# Patient Record
Sex: Female | Born: 1940 | Race: White | Hispanic: No | Smoking: Former smoker
Health system: Southern US, Community
[De-identification: ages and names within clinical notes are randomized; demographics above are authoritative.]

## PROBLEM LIST (undated history)

## (undated) DIAGNOSIS — Z923 Personal history of irradiation: Secondary | ICD-10-CM

## (undated) DIAGNOSIS — Z8679 Personal history of other diseases of the circulatory system: Secondary | ICD-10-CM

## (undated) DIAGNOSIS — I4819 Other persistent atrial fibrillation: Secondary | ICD-10-CM

## (undated) DIAGNOSIS — Z9889 Other specified postprocedural states: Secondary | ICD-10-CM

## (undated) DIAGNOSIS — Z973 Presence of spectacles and contact lenses: Secondary | ICD-10-CM

## (undated) DIAGNOSIS — IMO0001 Reserved for inherently not codable concepts without codable children: Secondary | ICD-10-CM

## (undated) DIAGNOSIS — M720 Palmar fascial fibromatosis [Dupuytren]: Secondary | ICD-10-CM

## (undated) DIAGNOSIS — I499 Cardiac arrhythmia, unspecified: Secondary | ICD-10-CM

## (undated) DIAGNOSIS — K5190 Ulcerative colitis, unspecified, without complications: Secondary | ICD-10-CM

## (undated) DIAGNOSIS — C50412 Malignant neoplasm of upper-outer quadrant of left female breast: Secondary | ICD-10-CM

## (undated) DIAGNOSIS — I10 Essential (primary) hypertension: Secondary | ICD-10-CM

## (undated) DIAGNOSIS — M8580 Other specified disorders of bone density and structure, unspecified site: Secondary | ICD-10-CM

## (undated) DIAGNOSIS — K635 Polyp of colon: Secondary | ICD-10-CM

## (undated) DIAGNOSIS — Z803 Family history of malignant neoplasm of breast: Secondary | ICD-10-CM

## (undated) DIAGNOSIS — Z7901 Long term (current) use of anticoagulants: Secondary | ICD-10-CM

## (undated) DIAGNOSIS — I2510 Atherosclerotic heart disease of native coronary artery without angina pectoris: Secondary | ICD-10-CM

## (undated) DIAGNOSIS — I484 Atypical atrial flutter: Secondary | ICD-10-CM

## (undated) DIAGNOSIS — I340 Nonrheumatic mitral (valve) insufficiency: Secondary | ICD-10-CM

## (undated) DIAGNOSIS — E785 Hyperlipidemia, unspecified: Secondary | ICD-10-CM

## (undated) DIAGNOSIS — Z951 Presence of aortocoronary bypass graft: Secondary | ICD-10-CM

## (undated) DIAGNOSIS — IMO0002 Reserved for concepts with insufficient information to code with codable children: Secondary | ICD-10-CM

## (undated) HISTORY — DX: Reserved for concepts with insufficient information to code with codable children: IMO0002

## (undated) HISTORY — DX: Essential (primary) hypertension: I10

## (undated) HISTORY — DX: Other specified disorders of bone density and structure, unspecified site: M85.80

## (undated) HISTORY — DX: Nonrheumatic mitral (valve) insufficiency: I34.0

## (undated) HISTORY — DX: Other persistent atrial fibrillation: I48.19

## (undated) HISTORY — PX: TONSILLECTOMY: SUR1361

## (undated) HISTORY — DX: Family history of malignant neoplasm of breast: Z80.3

## (undated) HISTORY — DX: Polyp of colon: K63.5

## (undated) HISTORY — DX: Palmar fascial fibromatosis (dupuytren): M72.0

## (undated) HISTORY — DX: Long term (current) use of anticoagulants: Z79.01

## (undated) HISTORY — DX: Atherosclerotic heart disease of native coronary artery without angina pectoris: I25.10

## (undated) HISTORY — PX: CARDIAC CATHETERIZATION: SHX172

## (undated) HISTORY — DX: Malignant neoplasm of upper-outer quadrant of left female breast: C50.412

## (undated) HISTORY — DX: Ulcerative colitis, unspecified, without complications: K51.90

## (undated) HISTORY — DX: Reserved for inherently not codable concepts without codable children: IMO0001

## (undated) HISTORY — PX: COLONOSCOPY: SHX174

---

## 1988-11-22 HISTORY — PX: DUPUYTREN CONTRACTURE RELEASE: SHX1478

## 1998-05-27 ENCOUNTER — Other Ambulatory Visit: Admission: RE | Admit: 1998-05-27 | Discharge: 1998-05-27 | Payer: Self-pay | Admitting: Obstetrics and Gynecology

## 1998-09-11 ENCOUNTER — Other Ambulatory Visit: Admission: RE | Admit: 1998-09-11 | Discharge: 1998-09-11 | Payer: Self-pay | Admitting: Obstetrics and Gynecology

## 1999-03-02 ENCOUNTER — Other Ambulatory Visit: Admission: RE | Admit: 1999-03-02 | Discharge: 1999-03-02 | Payer: Self-pay | Admitting: Obstetrics and Gynecology

## 1999-06-09 ENCOUNTER — Other Ambulatory Visit: Admission: RE | Admit: 1999-06-09 | Discharge: 1999-06-09 | Payer: Self-pay | Admitting: Obstetrics and Gynecology

## 1999-11-23 HISTORY — PX: DUPUYTREN CONTRACTURE RELEASE: SHX1478

## 2000-02-22 ENCOUNTER — Encounter: Payer: Self-pay | Admitting: Obstetrics and Gynecology

## 2000-02-22 ENCOUNTER — Encounter: Admission: RE | Admit: 2000-02-22 | Discharge: 2000-02-22 | Payer: Self-pay | Admitting: Obstetrics and Gynecology

## 2000-06-06 ENCOUNTER — Other Ambulatory Visit: Admission: RE | Admit: 2000-06-06 | Discharge: 2000-06-06 | Payer: Self-pay | Admitting: Obstetrics and Gynecology

## 2000-11-01 ENCOUNTER — Ambulatory Visit (HOSPITAL_BASED_OUTPATIENT_CLINIC_OR_DEPARTMENT_OTHER): Admission: RE | Admit: 2000-11-01 | Discharge: 2000-11-01 | Payer: Self-pay | Admitting: Orthopedic Surgery

## 2000-11-01 ENCOUNTER — Encounter (INDEPENDENT_AMBULATORY_CARE_PROVIDER_SITE_OTHER): Payer: Self-pay | Admitting: *Deleted

## 2001-05-08 ENCOUNTER — Encounter: Payer: Self-pay | Admitting: Obstetrics and Gynecology

## 2001-05-08 ENCOUNTER — Encounter: Admission: RE | Admit: 2001-05-08 | Discharge: 2001-05-08 | Payer: Self-pay | Admitting: Obstetrics and Gynecology

## 2001-06-06 ENCOUNTER — Other Ambulatory Visit: Admission: RE | Admit: 2001-06-06 | Discharge: 2001-06-06 | Payer: Self-pay | Admitting: Obstetrics and Gynecology

## 2001-06-21 ENCOUNTER — Encounter: Admission: RE | Admit: 2001-06-21 | Discharge: 2001-06-21 | Payer: Self-pay | Admitting: Obstetrics and Gynecology

## 2001-06-21 ENCOUNTER — Encounter: Payer: Self-pay | Admitting: Obstetrics and Gynecology

## 2002-06-28 ENCOUNTER — Other Ambulatory Visit: Admission: RE | Admit: 2002-06-28 | Discharge: 2002-06-28 | Payer: Self-pay | Admitting: Obstetrics and Gynecology

## 2002-11-08 ENCOUNTER — Encounter: Admission: RE | Admit: 2002-11-08 | Discharge: 2002-11-08 | Payer: Self-pay | Admitting: Obstetrics and Gynecology

## 2002-11-08 ENCOUNTER — Encounter: Payer: Self-pay | Admitting: Obstetrics and Gynecology

## 2003-09-10 ENCOUNTER — Encounter: Payer: Self-pay | Admitting: Obstetrics and Gynecology

## 2003-09-10 ENCOUNTER — Encounter: Admission: RE | Admit: 2003-09-10 | Discharge: 2003-09-10 | Payer: Self-pay | Admitting: Obstetrics and Gynecology

## 2003-11-11 ENCOUNTER — Encounter: Admission: RE | Admit: 2003-11-11 | Discharge: 2003-11-11 | Payer: Self-pay | Admitting: Obstetrics and Gynecology

## 2004-01-30 ENCOUNTER — Encounter (INDEPENDENT_AMBULATORY_CARE_PROVIDER_SITE_OTHER): Payer: Self-pay | Admitting: *Deleted

## 2004-01-30 ENCOUNTER — Ambulatory Visit (HOSPITAL_COMMUNITY): Admission: RE | Admit: 2004-01-30 | Discharge: 2004-01-30 | Payer: Self-pay | Admitting: Gastroenterology

## 2004-07-21 ENCOUNTER — Other Ambulatory Visit: Admission: RE | Admit: 2004-07-21 | Discharge: 2004-07-21 | Payer: Self-pay | Admitting: Obstetrics and Gynecology

## 2004-08-19 ENCOUNTER — Encounter: Admission: RE | Admit: 2004-08-19 | Discharge: 2004-08-19 | Payer: Self-pay | Admitting: Family Medicine

## 2005-02-11 ENCOUNTER — Encounter: Admission: RE | Admit: 2005-02-11 | Discharge: 2005-02-11 | Payer: Self-pay | Admitting: Obstetrics and Gynecology

## 2005-07-23 ENCOUNTER — Other Ambulatory Visit: Admission: RE | Admit: 2005-07-23 | Discharge: 2005-07-23 | Payer: Self-pay | Admitting: Obstetrics and Gynecology

## 2006-03-23 ENCOUNTER — Encounter: Payer: Self-pay | Admitting: Obstetrics and Gynecology

## 2006-04-12 ENCOUNTER — Encounter: Admission: RE | Admit: 2006-04-12 | Discharge: 2006-04-12 | Payer: Self-pay | Admitting: Obstetrics and Gynecology

## 2006-05-03 ENCOUNTER — Encounter: Admission: RE | Admit: 2006-05-03 | Discharge: 2006-05-03 | Payer: Self-pay | Admitting: Obstetrics and Gynecology

## 2006-07-26 ENCOUNTER — Other Ambulatory Visit: Admission: RE | Admit: 2006-07-26 | Discharge: 2006-07-26 | Payer: Self-pay | Admitting: Obstetrics & Gynecology

## 2007-03-17 ENCOUNTER — Encounter: Admission: RE | Admit: 2007-03-17 | Discharge: 2007-03-17 | Payer: Self-pay | Admitting: Family Medicine

## 2007-06-21 ENCOUNTER — Encounter: Admission: RE | Admit: 2007-06-21 | Discharge: 2007-06-21 | Payer: Self-pay | Admitting: Family Medicine

## 2007-08-04 ENCOUNTER — Other Ambulatory Visit: Admission: RE | Admit: 2007-08-04 | Discharge: 2007-08-04 | Payer: Self-pay | Admitting: Obstetrics and Gynecology

## 2008-07-01 ENCOUNTER — Encounter: Admission: RE | Admit: 2008-07-01 | Discharge: 2008-07-01 | Payer: Self-pay | Admitting: Family Medicine

## 2008-07-09 ENCOUNTER — Encounter: Admission: RE | Admit: 2008-07-09 | Discharge: 2008-07-09 | Payer: Self-pay | Admitting: Family Medicine

## 2008-08-05 ENCOUNTER — Other Ambulatory Visit: Admission: RE | Admit: 2008-08-05 | Discharge: 2008-08-05 | Payer: Self-pay | Admitting: Obstetrics and Gynecology

## 2008-11-11 ENCOUNTER — Encounter: Admission: RE | Admit: 2008-11-11 | Discharge: 2008-11-11 | Payer: Self-pay | Admitting: Obstetrics and Gynecology

## 2009-01-08 ENCOUNTER — Encounter: Admission: RE | Admit: 2009-01-08 | Discharge: 2009-01-08 | Payer: Self-pay | Admitting: Family Medicine

## 2009-07-03 ENCOUNTER — Encounter: Admission: RE | Admit: 2009-07-03 | Discharge: 2009-07-03 | Payer: Self-pay | Admitting: Obstetrics and Gynecology

## 2010-08-11 ENCOUNTER — Encounter: Admission: RE | Admit: 2010-08-11 | Discharge: 2010-08-11 | Payer: Self-pay | Admitting: Obstetrics & Gynecology

## 2010-08-17 ENCOUNTER — Encounter: Admission: RE | Admit: 2010-08-17 | Discharge: 2010-08-17 | Payer: Self-pay | Admitting: Obstetrics & Gynecology

## 2010-12-13 ENCOUNTER — Encounter: Payer: Self-pay | Admitting: Obstetrics & Gynecology

## 2010-12-13 ENCOUNTER — Encounter: Payer: Self-pay | Admitting: Obstetrics and Gynecology

## 2011-04-09 NOTE — Op Note (Signed)
NAME:  Michele Green, Michele Green                          ACCOUNT NO.:  [id number]   MEDICAL RECORD NO.:  [id number]                   PATIENT TYPE:  AMB   LOCATION:  ENDO                                 FACILITY:  [facility]   PHYSICIAN:  John C. Amedeo Plenty, M.D.                 DATE OF BIRTH:  Dec 24, 1940   DATE OF PROCEDURE:  01/30/2004  DATE OF DISCHARGE:                                 OPERATIVE REPORT   PROCEDURE PERFORMED:  Colonoscopy with polypectomy.   ENDOSCOPIST:  Missy Sabins, M.D.   INDICATIONS FOR PROCEDURE:  Colon cancer screening in a 70 year old patient  with no prior screening.   DESCRIPTION OF PROCEDURE:  The patient was placed in the left lateral  decubitus position and placed on the pulse monitor with continuous low-flow  oxygen delivered by nasal cannula.  The patient was sedated with 100 mcg of  IV fentanyl and 10 mg of IV Versed.  The Olympus video colonoscope was  inserted into the rectum and advanced to the cecum, confirmed by  transillumination of McBurney's point and visualization of the ileocecal  valve and appendiceal orifice.  The prep was excellent.  The cecum,  ascending, transverse, descending and sigmoid colon all appeared normal with  no masses, polyps, diverticula or other mucosal abnormalities.  There were  two or three nearly flat translucent polyps in the rectum no more than 6 mm  in the diameter and the largest of these was hot biopsied.  The remainder of  the rectum appeared normal.  The scope was then withdrawn and the patient  returned to the recovery room in stable condition.  She tolerated the  procedure well.  There were no immediate complications.   IMPRESSION:  Two or three small distal rectal polyps probably hyperplastic.   PLAN:  Await histology to determine method and interval for future colon  screening.                                               John C. Amedeo Plenty, M.D.    J[facility]/MEDQ  D:  01/30/2004  T:  01/31/2004  Job:  [id number]   cc:   Osvaldo Human, M.D.  [address] [place]  [place] [address]  Fax: [phone]

## 2011-04-09 NOTE — Op Note (Signed)
[place]  Patient:    Michele Green, Michele Green                       MRN: [id number] Proc. Date: 11/01/00 Adm. Date:  [id number] Attending:  Isaac Laud CC:         Two copies to Dr. Daylene Katayama   Operative Report  PREOPERATIVE DIAGNOSES: 1. Severe Dupuytrens contracture affecting left small finger involving the    palm and finger with pretendinous cords, spiral bands and extensive nodular    disease overlying the proximal and middle phalangeal segments of the    finger. 2. Palmar pretendinous fibromatosis ring finger, left hand.  POSTOPERATIVE DIAGNOSES: 1. Severe Dupuytrens contracture affecting left small finger involving the    palm and finger with pretendinous cords, spiral bands and extensive nodular    disease overlying the proximal and middle phalangeal segments of the    finger. 2. Palmar pretendinous fibromatosis ring finger, left hand.  OPERATION: 1. Excision of Dupuytrens contracture from left small finger involving the    palm and finger with VY advancement flap closure. 2. Resection of Dupuytrens contracture pretendinous fibers in the palm, left    ring finger.  SURGEON:  Youlanda Mighty. Sypher, Brooke Bonito., M.D.  ASSISTANT:  Julian Reil, P.A.  ANESTHESIA:  Axillary block.  SUPERVISING ANESTHESIOLOGIST:  Crissie Sickles. Conrad Shelley, M.D.  INDICATIONS:  Michele Green is a 70 year old woman who has had previous Dupuytrens contracture affecting the right hand.  She is status post surgical removal in 1996.  She has had no recurrence in her palm but has had a minor recurrence in her small finger distal to the site of previous surgery. Recently she developed a profound Dupuytrens contracture affecting her left hand causing a significant PIP and MP flexion contracture with hyperextension of her DIP joint.  She requested surgical correction of this at this time.  After informed consent, in which she was advised of the potential complications  including neurovascular injury and/or development of reflex sympathetic dystropy she agrees to proceed with surgery at this time.  She understands that it is likely that her Dupuytrens diathesis will progress over time causing further fibromatosis elsewhere in her hand and can even recur in the operated fingers.  DESCRIPTION OF PROCEDURE:  Following placement of an axillary block in the holding area, anesthesia was satisfactory in the right arm.  The arm was then prepped with Betadine soap and solution and sterilely draped.  Following exsanguination of the limb with an Esmarch bandage, tourniquet was inflated to 240 mmHg.  Procedure commenced with planning of a Brunners zigzag incision in the palm and extending distally to the DIP flexion crease of the small finger.  Skin incisions were taken sharply and skin undermined at the level of the dermis, carefully separating the palmar fibromatosis from the dermis.  Care was taken to identify the superficial palmar arch and common digital vessels to the ring and small fingers.  The dissection was carried distally excising the pretendinous fibers to the ring and small fingers followed by removal of a spiral band and an extension from the abductor digiti minimi extending to the middle phalangeal segment of the small finger.  A very large nodule was excised from the proximal phalangeal segment of the small finger that was ulnar to the neurovascular bundle and probably represented the lateral fascial sheath of the finger.  A second large nodule was removed from the middle phalangeal segment that was more  midline.  The neurovascular bundles were carefully detected throughout. The natatory ligaments were resected between the ring and small fingers followed by removal of the pretendinous fibers to the proximal phalangeal segment of the ring finger.  Great care was taken to dissect the C1 pulley and the spiral oblique retinacular ligament that was  causing the hyperextension posture of the distal interphalangeal joint.  The contracture was relieved to 20 degrees at the PIP joint.  The PIP joint was then gently manipulated with release of the volar plate to allow full extension of the PIP joint.  With the PIP in 0 degrees I could flex the DIP joint 70 degrees with minimal tension.  There was a slight contracture of the extensor mechanism which will require therapy to resolve.  Bleeding points were electrocauterized with bipolar current followed by careful inspection for residual subdermal disease.  This was removed from the deep side of the dermis with a rongeur.  The skin flaps were then tailored to allow closure of the small finger skin without excessive space deep to the dermis.  VY advancement flaps were performed to lengthen the palmar skin.  The flaps were inset with coroner sutures of 5-0 nylon and simple interrupted sutures of 5-0 nylon.  The tourniquet was released with immediate capillary refill in the finger tip of the small and ring fingers.  The wound was then dressed with Adaptic, Silvadene, a voluminous gauze dressing, a Curlex fluff and a volar plaster splint maintaining the wrist in a neutral position and the MP and PIP joints of the small finger in full extension.  Michele Green tolerated surgery and anesthesia well.  She was transferred to the recovery room with stable vital signs.  She will be discharged with prescriptions for Percocet 5 mg one or two tablets p.o. q.4-6h. p.r.n. pain, also Keflex 500 mg one p.o. q.8h. x4 days as a prophylactic antibiotic and Motrin 600 mg one p.o. q.6h. p.r.n. pain.DD: 11/01/00 TD:  11/01/00 Job: [id number]

## 2011-09-01 ENCOUNTER — Other Ambulatory Visit: Payer: Self-pay | Admitting: Obstetrics & Gynecology

## 2011-09-01 DIAGNOSIS — Z1231 Encounter for screening mammogram for malignant neoplasm of breast: Secondary | ICD-10-CM

## 2011-09-21 ENCOUNTER — Ambulatory Visit: Payer: Self-pay

## 2011-10-15 ENCOUNTER — Ambulatory Visit
Admission: RE | Admit: 2011-10-15 | Discharge: 2011-10-15 | Disposition: A | Discharge status: Home / Self Care | Payer: Medicare Other | Source: Ambulatory Visit | Attending: Obstetrics & Gynecology | Admitting: Obstetrics & Gynecology

## 2011-10-15 DIAGNOSIS — Z1231 Encounter for screening mammogram for malignant neoplasm of breast: Secondary | ICD-10-CM

## 2012-02-21 DIAGNOSIS — K635 Polyp of colon: Secondary | ICD-10-CM

## 2012-02-21 HISTORY — DX: Polyp of colon: K63.5

## 2012-03-21 ENCOUNTER — Other Ambulatory Visit: Payer: Self-pay | Admitting: Gastroenterology

## 2012-10-25 ENCOUNTER — Other Ambulatory Visit: Payer: Self-pay | Admitting: Obstetrics & Gynecology

## 2012-10-25 DIAGNOSIS — Z1231 Encounter for screening mammogram for malignant neoplasm of breast: Secondary | ICD-10-CM

## 2012-12-04 ENCOUNTER — Ambulatory Visit
Admission: RE | Admit: 2012-12-04 | Discharge: 2012-12-04 | Disposition: A | Discharge status: Home / Self Care | Payer: Medicare Other | Source: Ambulatory Visit | Attending: Obstetrics & Gynecology | Admitting: Obstetrics & Gynecology

## 2012-12-04 DIAGNOSIS — Z1231 Encounter for screening mammogram for malignant neoplasm of breast: Secondary | ICD-10-CM

## 2013-09-03 ENCOUNTER — Ambulatory Visit (INDEPENDENT_AMBULATORY_CARE_PROVIDER_SITE_OTHER): Payer: Medicare Other | Admitting: Nurse Practitioner

## 2013-09-03 ENCOUNTER — Encounter: Payer: Self-pay | Admitting: Nurse Practitioner

## 2013-09-03 VITALS — BP 110/60 | HR 64 | Resp 16 | Ht 63.75 in | Wt 145.0 lb

## 2013-09-03 DIAGNOSIS — M8580 Other specified disorders of bone density and structure, unspecified site: Secondary | ICD-10-CM

## 2013-09-03 DIAGNOSIS — F4323 Adjustment disorder with mixed anxiety and depressed mood: Secondary | ICD-10-CM

## 2013-09-03 DIAGNOSIS — Z01419 Encounter for gynecological examination (general) (routine) without abnormal findings: Secondary | ICD-10-CM

## 2013-09-03 DIAGNOSIS — E559 Vitamin D deficiency, unspecified: Secondary | ICD-10-CM

## 2013-09-03 DIAGNOSIS — M899 Disorder of bone, unspecified: Secondary | ICD-10-CM

## 2013-09-03 MED ORDER — ERGOCALCIFEROL 1.25 MG (50000 UT) PO CAPS: 50000.0000 [IU] | ORAL_CAPSULE | ORAL | Status: DC

## 2013-09-03 MED ORDER — PAROXETINE HCL 20 MG PO TABS: 20.0000 mg | ORAL_TABLET | Freq: Every day | ORAL | Status: DC

## 2013-09-03 NOTE — Patient Instructions (Signed)

## 2013-09-03 NOTE — Progress Notes (Signed)
Patient ID: Michele Green, female   DOB: 30-Aug-1941, 72 y.o.   MRN: [id number] 72 y.o. [id number] Married Caucasian Fe here for annual exam.  No new health problems.  Doing well on Paxil and wants to continue.  They took the 103 yo granddaughter to [place] this year and spent an enjoyable time with her.  No LMP recorded. Patient is postmenopausal.          Sexually active: no  The current method of family planning is post menopausal status.    Exercising: yes  walking Smoker:  no  Health Maintenance: Pap: 08/27/10, WNL MMG: 12/05/12, BI-RADS 1: negative Colonoscopy: 02/2012, polyp BMD: 2013 per patient, Low Bone Mass followed at PCP TDaP: 2012, flu 08/22/13 Shingles: 03/2007 Labs: PCP    reports that she quit smoking about 20 years ago. Her smoking use included Cigarettes. She has a 20 pack-year smoking history. She has never used smokeless tobacco. She reports that she drinks about 1.5 ounces of alcohol per week. She reports that she does not use illicit drugs.  Past Medical History  Diagnosis Date  . Osteopenia   . Hypertension   . Colon polyp 02/2012  . Colitis   . Dupuytren contracture     bilateral hands    Past Surgical History  Procedure Laterality Date  . Dupuytren contracture release Bilateral about 2000  . Tonsillectomy  age 63    Current Outpatient Prescriptions  Medication Sig Dispense Refill  . allopurinol (ZYLOPRIM) 100 MG tablet Take 1 tablet by mouth 2 (two) times daily.      [name] atenolol (TENORMIN) 50 MG tablet Take 1 tablet by mouth daily.      [name] CALCIUM PO Take by mouth.      . ergocalciferol (VITAMIN D2) 50000 UNITS capsule Take 1 capsule (50,000 Units total) by mouth once a week.  30 capsule  3  . LIALDA 1.2 G EC tablet Take 1 tablet by mouth 4 (four) times daily.       [name] PARoxetine (PAXIL) 20 MG tablet Take 1 tablet (20 mg total) by mouth daily.  90 tablet  3  . simvastatin (ZOCOR) 20 MG tablet Take 1 tablet by mouth daily.       No current  facility-administered medications for this visit.    Family History  Problem Relation Age of Onset  . Cirrhosis Mother   . Heart failure Father   . Breast cancer Sister 20  . Kidney Stones Sister     loss of kidney due to stones    ROS:  Pertinent items are noted in HPI.  Otherwise, a comprehensive ROS was negative.  Exam:   BP 110/60  Pulse 64  Resp 16  Ht 5' 3.75" (1.619 m)  Wt 145 lb (65.772 kg)  BMI 25.09 kg/m2 Height: 5' 3.75" (161.9 cm)  Ht Readings from Last 3 Encounters:  09/03/13 5' 3.75" (1.619 m)    General appearance: alert, cooperative and appears stated age Head: Normocephalic, without obvious abnormality, atraumatic Neck: no adenopathy, supple, symmetrical, trachea midline and thyroid normal to inspection and palpation Lungs: clear to auscultation bilaterally Breasts: normal appearance, no masses or tenderness Heart: regular rate and rhythm Abdomen: soft, non-tender; no masses,  no organomegaly Extremities: extremities normal, atraumatic, no cyanosis or edema, dupuytren contractures both hands Skin: Skin color, texture, turgor normal. No rashes or lesions Lymph nodes: Cervical, supraclavicular, and axillary nodes normal. No abnormal inguinal nodes palpated Neurologic: Grossly normal   Pelvic: External genitalia:  no  lesions              Urethra:  normal appearing urethra with no masses, tenderness or lesions              Bartholin's and Skene's: normal                 Vagina: atrophic appearing vagina with pale color and no discharge, no lesions              Cervix: anteverted              Pap taken: no Bimanual Exam:  Uterus:  normal size, contour, position, consistency, mobility, non-tender              Adnexa: no mass, fullness, tenderness               Rectovaginal: declines               Anus:  Declined per patient request secondary to colitis flare  A:  Well Woman with normal exam  Postmenopausal - no HRT  Osteopenia - followed by PCP, off  Fosamax since 12/09  History of chronic anxiety - stable on med's  History of Colitis, HTN  P:   Pap smear as per guidelines   Mammogram due 1/15  Refill on Paxil and Vit D  Will follow with Vit D results (last year @ 2)  Counseled on breast self exam, osteoporosis, adequate intake of calcium and vitamin D, diet and exercise, Kegel's exercises return annually or prn  An After Visit Summary was printed and given to the patient.

## 2013-09-06 NOTE — Progress Notes (Signed)
Encounter reviewed by Dr. Elianna Windom Silva.  

## 2013-11-07 ENCOUNTER — Ambulatory Visit (INDEPENDENT_AMBULATORY_CARE_PROVIDER_SITE_OTHER): Payer: Medicare Other | Admitting: Interventional Cardiology

## 2013-11-07 ENCOUNTER — Encounter: Payer: Self-pay | Admitting: Interventional Cardiology

## 2013-11-07 VITALS — BP 120/60 | HR 68 | Ht 63.75 in | Wt 145.0 lb

## 2013-11-07 DIAGNOSIS — E785 Hyperlipidemia, unspecified: Secondary | ICD-10-CM

## 2013-11-07 DIAGNOSIS — I10 Essential (primary) hypertension: Secondary | ICD-10-CM

## 2013-11-07 DIAGNOSIS — I059 Rheumatic mitral valve disease, unspecified: Secondary | ICD-10-CM

## 2013-11-07 DIAGNOSIS — I340 Nonrheumatic mitral (valve) insufficiency: Secondary | ICD-10-CM | POA: Insufficient documentation

## 2013-11-07 HISTORY — DX: Nonrheumatic mitral (valve) insufficiency: I34.0

## 2013-11-07 NOTE — Patient Instructions (Signed)
Your physician recommends that you continue on your current medications as directed. Please refer to the Current Medication list given to you today.  Your physician has requested that you have an echocardiogram. Echocardiography is a painless test that uses sound waves to create images of your heart. It provides your doctor with information about the size and shape of your heart and how well your heart's chambers and valves are working. This procedure takes approximately one hour. There are no restrictions for this procedure.( to be scheduled for 3-6 months)  Your physician wants you to follow-up in: 1 year You will receive a reminder letter in the mail two months in advance. If you don't receive a letter, please call our office to schedule the follow-up appointment.

## 2013-11-07 NOTE — Progress Notes (Signed)
Patient ID: Michele Green, female   DOB: 1941-07-15, 72 y.o.   MRN: [id number]    [address], [address] [place], [place]  [address] Phone: [phone] Fax:  [phone]  Date:  11/07/2013   ID:  Michele Green, DOB 01/30/1941, MRN [id number]  PCP:  [name], MD   ASSESSMENT:  1. Mitral regurgitation, moderate  2. Hyperlipidemia 3. Hypertension   PLAN:  1. 2-D Doppler echocardiogram within the next 6 months 2. Clinical followup in one year or earlier if symptoms   SUBJECTIVE: Michele Green is a 72 y.o. female who had one short-lived episode of sudden dyspnea because her to sit upright in bed. She quit cyst taken a flu shot. She had dyspnea which lasted approximately 10 minutes and then resolved. She fell no palpitations. Her exertional tolerance has not been impaired since that episode. This occurred in October. She denies orthopnea, PND, and peripheral edema no chills, fever, rash, or other complaints are noted   Wt Readings from Last 3 Encounters:  11/07/13 145 lb (65.772 kg)  09/03/13 145 lb (65.772 kg)     Past Medical History  Diagnosis Date  . Osteopenia   . Hypertension   . Colon polyp 02/2012  . Colitis   . Dupuytren contracture     bilateral hands    Current Outpatient Prescriptions  Medication Sig Dispense Refill  . allopurinol (ZYLOPRIM) 100 MG tablet Take 1 tablet by mouth 2 (two) times daily.      [name] atenolol (TENORMIN) 50 MG tablet Take 1 tablet by mouth daily.      [name] CALCIUM PO Take by mouth.      . ergocalciferol (VITAMIN D2) 50000 UNITS capsule Take 1 capsule (50,000 Units total) by mouth once a week.  30 capsule  3  . LIALDA 1.2 G EC tablet Take 1 tablet by mouth 4 (four) times daily.       [name] PARoxetine (PAXIL) 20 MG tablet Take 1 tablet (20 mg total) by mouth daily.  90 tablet  3  . simvastatin (ZOCOR) 20 MG tablet Take 1 tablet by mouth daily.       No current facility-administered medications for this visit.    Allergies:    No Known Allergies  Social History:  The patient  reports that she quit smoking about 20 years ago. Her smoking use included Cigarettes. She has a 20 pack-year smoking history. She has never used smokeless tobacco. She reports that she drinks about 1.5 ounces of alcohol per week. She reports that she does not use illicit drugs.   ROS:  Please see the history of present illness.    All other systems reviewed and negative.   OBJECTIVE: VS:  BP 120/60  Pulse 68  Ht 5' 3.75" (1.619 m)  Wt 145 lb (65.772 kg)  BMI 25.09 kg/m2 Well nourished, well developed, in no acute distress HEENT: normal Neck: JVD flat. Carotid bruit 2+  Cardiac:  normal S1, S2; RRR; no murmur Lungs:  clear to auscultation bilaterally, no wheezing, rhonchi or rales Abd: soft, nontender, no hepatomegaly Ext: Edema absent. Pulses 2+ Skin: warm and dry Neuro:  CNs 2-12 intact, no focal abnormalities noted  EKG:  Normal sinus rhythm with nonspecific T wave flattening       Signed, Illene Labrador III, MD 11/07/2013 2:15 PM

## 2013-11-21 ENCOUNTER — Telehealth: Payer: Self-pay | Admitting: *Deleted

## 2013-11-21 NOTE — Telephone Encounter (Signed)
Fax From: [facility] requesting new rx for Paxil & Vit D 50,000 Last Refilled: AEX 09/03/13   S/w [facility] at [facility] they did not have rx that were sent, called in  Paxil 20 mg #90/3 refills Vitamin D 50,000 #13/3 refills (only what patients insurance will cover) into [facility].

## 2014-02-05 ENCOUNTER — Ambulatory Visit (HOSPITAL_COMMUNITY): Payer: Medicare Other | Attending: Interventional Cardiology | Admitting: Radiology

## 2014-02-05 DIAGNOSIS — I059 Rheumatic mitral valve disease, unspecified: Secondary | ICD-10-CM | POA: Insufficient documentation

## 2014-02-05 DIAGNOSIS — I340 Nonrheumatic mitral (valve) insufficiency: Secondary | ICD-10-CM

## 2014-02-05 NOTE — Progress Notes (Signed)
Echocardiogram Performed. 

## 2014-02-07 ENCOUNTER — Other Ambulatory Visit: Payer: Self-pay | Admitting: Family Medicine

## 2014-02-07 DIAGNOSIS — R748 Abnormal levels of other serum enzymes: Secondary | ICD-10-CM

## 2014-02-11 ENCOUNTER — Ambulatory Visit
Admission: RE | Admit: 2014-02-11 | Discharge: 2014-02-11 | Disposition: A | Discharge status: Home / Self Care | Payer: Medicare Other | Source: Ambulatory Visit | Attending: Family Medicine | Admitting: Family Medicine

## 2014-02-11 ENCOUNTER — Other Ambulatory Visit: Payer: Self-pay | Admitting: Family Medicine

## 2014-02-11 DIAGNOSIS — R748 Abnormal levels of other serum enzymes: Secondary | ICD-10-CM

## 2014-02-13 ENCOUNTER — Telehealth: Payer: Self-pay | Admitting: Interventional Cardiology

## 2014-02-13 DIAGNOSIS — I059 Rheumatic mitral valve disease, unspecified: Secondary | ICD-10-CM

## 2014-02-13 NOTE — Telephone Encounter (Signed)
New message     Returning nurses call to get echo results.

## 2014-02-14 NOTE — Telephone Encounter (Signed)
Message copied by Lamar Laundry on Thu Feb 14, 2014 10:24 AM ------      Message from: Daneen Schick      Created: Sun Feb 10, 2014  8:09 AM       Valve leak is mild to moderate. This is not a great concern and just needs to be followed for now. ------

## 2014-02-14 NOTE — Telephone Encounter (Signed)
pt given echo results.Valve leak is mild to moderate. This is not a great concern and just needs to be followed for now. pt verbalized understanding.

## 2014-03-07 ENCOUNTER — Other Ambulatory Visit: Payer: Self-pay

## 2014-03-07 DIAGNOSIS — Z1231 Encounter for screening mammogram for malignant neoplasm of breast: Secondary | ICD-10-CM

## 2014-03-14 ENCOUNTER — Encounter (INDEPENDENT_AMBULATORY_CARE_PROVIDER_SITE_OTHER): Payer: Self-pay

## 2014-03-14 ENCOUNTER — Ambulatory Visit
Admission: RE | Admit: 2014-03-14 | Discharge: 2014-03-14 | Disposition: A | Discharge status: Home / Self Care | Payer: Medicare Other | Source: Ambulatory Visit

## 2014-03-14 DIAGNOSIS — Z1231 Encounter for screening mammogram for malignant neoplasm of breast: Secondary | ICD-10-CM

## 2014-04-29 ENCOUNTER — Encounter (HOSPITAL_BASED_OUTPATIENT_CLINIC_OR_DEPARTMENT_OTHER): Payer: Self-pay | Admitting: *Deleted

## 2014-04-29 NOTE — Progress Notes (Signed)
To come in for bmet-has had dupytrens x4 in past

## 2014-04-30 ENCOUNTER — Encounter (HOSPITAL_BASED_OUTPATIENT_CLINIC_OR_DEPARTMENT_OTHER)
Admission: RE | Admit: 2014-04-30 | Discharge: 2014-04-30 | Disposition: A | Discharge status: Home / Self Care | Payer: Medicare Other | Source: Ambulatory Visit | Attending: Orthopedic Surgery | Admitting: Orthopedic Surgery

## 2014-04-30 ENCOUNTER — Other Ambulatory Visit: Payer: Self-pay | Admitting: Orthopedic Surgery

## 2014-04-30 LAB — BASIC METABOLIC PANEL
BUN: 15 mg/dL (ref 6–23)
CHLORIDE: 103 meq/L (ref 96–112)
CO2: 26 meq/L (ref 19–32)
CREATININE: 0.63 mg/dL (ref 0.50–1.10)
Calcium: 9.5 mg/dL (ref 8.4–10.5)
GFR calc non Af Amer: 86 mL/min — ABNORMAL LOW (ref >90)
GLUCOSE: 90 mg/dL (ref 70–99)
POTASSIUM: 4.6 meq/L (ref 3.7–5.3)
Sodium: 141 mEq/L (ref 137–147)

## 2014-05-01 NOTE — H&P (Signed)
Michele Green is an 73 y.o. female.   Chief Complaint: c/o contracture of right hand HPI: Michele Green returns for evaluation and management of a severe right small finger Dupuytren's palmar fibromatosis with a 45 degree flexion contracture of the MP joint, 90 degree flexion contracture of the PIP joint and 5 degree flexion contracture of the DIP joint.   Michele Green is status post revision surgery to her left hand for a recurrent left Dupuytren's palmar fibromatosis by our practice.  She has had an excellent long term result on the left.  She now returns for revision of her right small finger.    She cannot recall when her original surgery was performed nor where it was performed. She is eager to proceed with excision of her palmar fibromatosis in the near future.  Past Medical History  Diagnosis Date  . Osteopenia   . Hypertension   . Colon polyp 02/2012  . Colitis   . Dupuytren contracture     bilateral hands  . Wears glasses   . Arthritis     Past Surgical History  Procedure Laterality Date  . Dupuytren contracture release  2001    leftx2  . Tonsillectomy  age 51  . Dupuytren contracture release  1990    right  . Colonoscopy      Family History  Problem Relation Age of Onset  . Cirrhosis Mother   . Heart failure Father   . Breast cancer Sister 56  . Kidney Stones Sister     loss of kidney due to stones   Social History:  reports that she quit smoking about 21 years ago. Her smoking use included Cigarettes. She has a 20 pack-year smoking history. She has never used smokeless tobacco. She reports that she drinks about 1.5 ounces of alcohol per week. She reports that she does not use illicit drugs.  Allergies: No Known Allergies  No prescriptions prior to admission    Results for orders placed during the hospital encounter of 05/02/14 (from the past 48 hour(s))  BASIC METABOLIC PANEL     Status: Abnormal   Collection Time    04/30/14 11:55 AM      Result Value Ref  Range   Sodium 141  137 - 147 mEq/L   Potassium 4.6  3.7 - 5.3 mEq/L   Chloride 103  96 - 112 mEq/L   CO2 26  19 - 32 mEq/L   Glucose, Bld 90  70 - 99 mg/dL   BUN 15  6 - 23 mg/dL   Creatinine, Ser 0.63  0.50 - 1.10 mg/dL   Calcium 9.5  8.4 - 10.5 mg/dL   GFR calc non Af Amer 86 (*) >90 mL/min   GFR calc Af Amer >90  >90 mL/min   Comment: (NOTE)     The eGFR has been calculated using the CKD EPI equation.     This calculation has not been validated in all clinical situations.     eGFR's persistently <90 mL/min signify possible Chronic Kidney     Disease.    No results found.   Pertinent items are noted in HPI.  Height 5' 4"  (1.626 m), weight 65.772 kg (145 lb).  General appearance: alert Head: Normocephalic, without obvious abnormality Neck: supple, symmetrical, trachea midline Resp: clear to auscultation bilaterally Cardio: regular rate and rhythm GI: normal findings: bowel sounds normal Extremities:  Inspection of her right hand reveals extension Dupuytren's fibromatosis along the ulnar aspect of her hypothenar muscles and a thick  cord at the abductor digiti minimi and along the ulnar aspect of her small finger.  She has a  45 degree flexion contracture of the MP joint and 90 degrees flexion contracture of the PIP joint and a 5 degree flexion contracture of the DIP joint.  She has no involvement of her thumb, index, long or ring fingers.  She is able to close all fingertips to the palm.  She has normal appearing nails.  Her pulse and cap refill are intact.  Her left side surgery has done exceptionally well.  She can get her hand flat on the table and close her fist tight.  X-rays of her hand are reviewed dated 04/24/14.  She has minimal degenerative arthritis at the distal interphalangeal joints.  The posture of her fingers is well documented by the x-rays.     Pulses: 2+ and symmetric Skin: mobility and turgor normal Neurologic: Grossly  normal    Assessment/Plan Impression:  Right hand Dupuytren's contracture. The difficulty of managing Dupuytren's fibromatosis of the small finger in women with OA and significant contracture has been detailed.  She will experience a variable degree of recurrence despite our best effort.  This is a salvage effort to improve posture of the finger and facilitate prehension.  Plan: To the OR for excision Dupuytren's right hand involving small finger and palm.The procedure, risks,benefits and post-op course were discussed with the patient at length and they were in agreement with the plan.  DASNOIT,Sierra Bissonette J 05/01/2014, 4:15 PM  H&P documentation: 05/02/2014  -History and Physical Reviewed  -Patient has been re-examined  -No change in the plan of care  Cammie Sickle, MD

## 2014-05-02 ENCOUNTER — Ambulatory Visit (HOSPITAL_BASED_OUTPATIENT_CLINIC_OR_DEPARTMENT_OTHER)
Admission: RE | Admit: 2014-05-02 | Discharge: 2014-05-02 | Disposition: A | Discharge status: Home / Self Care | Payer: Medicare Other | Source: Ambulatory Visit | Attending: Orthopedic Surgery | Admitting: Orthopedic Surgery

## 2014-05-02 ENCOUNTER — Encounter (HOSPITAL_BASED_OUTPATIENT_CLINIC_OR_DEPARTMENT_OTHER): Payer: Medicare Other | Admitting: Anesthesiology

## 2014-05-02 ENCOUNTER — Ambulatory Visit (HOSPITAL_BASED_OUTPATIENT_CLINIC_OR_DEPARTMENT_OTHER): Payer: Medicare Other | Admitting: Anesthesiology

## 2014-05-02 ENCOUNTER — Encounter (HOSPITAL_BASED_OUTPATIENT_CLINIC_OR_DEPARTMENT_OTHER)
Admission: RE | Disposition: A | Discharge status: Home / Self Care | Payer: Self-pay | Source: Ambulatory Visit | Attending: Orthopedic Surgery

## 2014-05-02 ENCOUNTER — Encounter (HOSPITAL_BASED_OUTPATIENT_CLINIC_OR_DEPARTMENT_OTHER): Payer: Self-pay | Admitting: Orthopedic Surgery

## 2014-05-02 DIAGNOSIS — M720 Palmar fascial fibromatosis [Dupuytren]: Secondary | ICD-10-CM | POA: Insufficient documentation

## 2014-05-02 DIAGNOSIS — I10 Essential (primary) hypertension: Secondary | ICD-10-CM | POA: Insufficient documentation

## 2014-05-02 DIAGNOSIS — M899 Disorder of bone, unspecified: Secondary | ICD-10-CM | POA: Insufficient documentation

## 2014-05-02 DIAGNOSIS — Z87891 Personal history of nicotine dependence: Secondary | ICD-10-CM | POA: Insufficient documentation

## 2014-05-02 DIAGNOSIS — M949 Disorder of cartilage, unspecified: Secondary | ICD-10-CM

## 2014-05-02 HISTORY — DX: Presence of spectacles and contact lenses: Z97.3

## 2014-05-02 HISTORY — PX: DUPUYTREN CONTRACTURE RELEASE: SHX1478

## 2014-05-02 LAB — POCT HEMOGLOBIN-HEMACUE: Hemoglobin: 14.4 g/dL (ref 12.0–15.0)

## 2014-05-02 SURGERY — RELEASE, DUPUYTREN CONTRACTURE
Anesthesia: General | Site: Hand | Laterality: Right

## 2014-05-02 MED ORDER — MIDAZOLAM HCL 2 MG/2ML IJ SOLN: 1.0000 mg | INTRAMUSCULAR | Status: DC | PRN

## 2014-05-02 MED ORDER — FENTANYL CITRATE 0.05 MG/ML IJ SOLN
INTRAMUSCULAR | Status: AC
  Filled 2014-05-02: qty 6

## 2014-05-02 MED ORDER — LIDOCAINE HCL 2 % IJ SOLN
INTRAMUSCULAR | Status: DC | PRN
  Administered 2014-05-02: 2 mL

## 2014-05-02 MED ORDER — FENTANYL CITRATE 0.05 MG/ML IJ SOLN: 50.0000 ug | INTRAMUSCULAR | Status: DC | PRN

## 2014-05-02 MED ORDER — LIDOCAINE HCL (CARDIAC) 20 MG/ML IV SOLN
INTRAVENOUS | Status: DC | PRN
  Administered 2014-05-02: 50 mg via INTRAVENOUS

## 2014-05-02 MED ORDER — SILVER SULFADIAZINE 1 % EX CREA
TOPICAL_CREAM | CUTANEOUS | Status: DC | PRN
  Administered 2014-05-02: 1 via TOPICAL

## 2014-05-02 MED ORDER — OXYCODONE HCL 5 MG PO TABS
5.0000 mg | ORAL_TABLET | Freq: Once | ORAL | Status: AC | PRN
  Administered 2014-05-02: 5 mg via ORAL

## 2014-05-02 MED ORDER — OXYCODONE-ACETAMINOPHEN 5-325 MG PO TABS: ORAL_TABLET | ORAL | Status: DC

## 2014-05-02 MED ORDER — HYDROMORPHONE HCL PF 1 MG/ML IJ SOLN
0.2500 mg | INTRAMUSCULAR | Status: DC | PRN
  Administered 2014-05-02: 0.5 mg via INTRAVENOUS

## 2014-05-02 MED ORDER — CEFAZOLIN SODIUM-DEXTROSE 2-3 GM-% IV SOLR
INTRAVENOUS | Status: DC | PRN
Start: 2014-05-02 — End: 2014-05-02
  Administered 2014-05-02: 2 g via INTRAVENOUS

## 2014-05-02 MED ORDER — PROPOFOL 10 MG/ML IV BOLUS
INTRAVENOUS | Status: DC | PRN
  Administered 2014-05-02: 150 mg via INTRAVENOUS

## 2014-05-02 MED ORDER — FENTANYL CITRATE 0.05 MG/ML IJ SOLN
INTRAMUSCULAR | Status: DC | PRN
  Administered 2014-05-02 (×3): 50 ug via INTRAVENOUS

## 2014-05-02 MED ORDER — LACTATED RINGERS IV SOLN
INTRAVENOUS | Status: DC
  Administered 2014-05-02 (×2): via INTRAVENOUS

## 2014-05-02 MED ORDER — CEFAZOLIN SODIUM-DEXTROSE 2-3 GM-% IV SOLR
INTRAVENOUS | Status: AC
  Filled 2014-05-02: qty 50

## 2014-05-02 MED ORDER — OXYCODONE HCL 5 MG/5ML PO SOLN: 5.0000 mg | Freq: Once | ORAL | Status: AC | PRN

## 2014-05-02 MED ORDER — CHLORHEXIDINE GLUCONATE 4 % EX LIQD: 60.0000 mL | Freq: Once | CUTANEOUS | Status: DC

## 2014-05-02 MED ORDER — OXYCODONE HCL 5 MG PO TABS
ORAL_TABLET | ORAL | Status: AC
  Filled 2014-05-02: qty 1

## 2014-05-02 MED ORDER — DEXAMETHASONE SODIUM PHOSPHATE 10 MG/ML IJ SOLN
INTRAMUSCULAR | Status: DC | PRN
  Administered 2014-05-02: 10 mg via INTRAVENOUS

## 2014-05-02 MED ORDER — CEFAZOLIN SODIUM-DEXTROSE 2-3 GM-% IV SOLR: 2.0000 g | INTRAVENOUS | Status: DC

## 2014-05-02 MED ORDER — MIDAZOLAM HCL 5 MG/5ML IJ SOLN
INTRAMUSCULAR | Status: DC | PRN
  Administered 2014-05-02: 2 mg via INTRAVENOUS

## 2014-05-02 MED ORDER — HYDROMORPHONE HCL PF 1 MG/ML IJ SOLN
INTRAMUSCULAR | Status: AC
  Filled 2014-05-02: qty 1

## 2014-05-02 MED ORDER — SILVER SULFADIAZINE 1 % EX CREA
TOPICAL_CREAM | CUTANEOUS | Status: AC
  Filled 2014-05-02: qty 85

## 2014-05-02 MED ORDER — LIDOCAINE HCL 2 % IJ SOLN
INTRAMUSCULAR | Status: AC
  Filled 2014-05-02: qty 20

## 2014-05-02 MED ORDER — METOCLOPRAMIDE HCL 5 MG/ML IJ SOLN: 10.0000 mg | Freq: Once | INTRAMUSCULAR | Status: DC | PRN

## 2014-05-02 MED ORDER — CEPHALEXIN 500 MG PO CAPS: 500.0000 mg | ORAL_CAPSULE | Freq: Three times a day (TID) | ORAL | Status: DC

## 2014-05-02 MED ORDER — ONDANSETRON HCL 4 MG/2ML IJ SOLN
INTRAMUSCULAR | Status: DC | PRN
  Administered 2014-05-02 (×2): 4 mg via INTRAVENOUS

## 2014-05-02 SURGICAL SUPPLY — 60 items
BANDAGE ADH SHEER 1  50/CT (GAUZE/BANDAGES/DRESSINGS) IMPLANT
BANDAGE ELASTIC 3 VELCRO ST LF (GAUZE/BANDAGES/DRESSINGS) ×2 IMPLANT
BLADE MINI RND TIP GREEN BEAV (BLADE) ×3 IMPLANT
BLADE SURG 15 STRL LF DISP TIS (BLADE) ×1 IMPLANT
BLADE SURG 15 STRL SS (BLADE) ×6
BNDG CMPR 9X4 STRL LF SNTH (GAUZE/BANDAGES/DRESSINGS) ×1
BNDG CMPR MD 5X2 ELC HKLP STRL (GAUZE/BANDAGES/DRESSINGS) ×2
BNDG ELASTIC 2 VLCR STRL LF (GAUZE/BANDAGES/DRESSINGS) ×4 IMPLANT
BNDG ESMARK 4X9 LF (GAUZE/BANDAGES/DRESSINGS) ×3 IMPLANT
BNDG GAUZE ELAST 4 BULKY (GAUZE/BANDAGES/DRESSINGS) ×4 IMPLANT
BRUSH SCRUB EZ PLAIN DRY (MISCELLANEOUS) ×3 IMPLANT
CLOSURE WOUND 1/2 X4 (GAUZE/BANDAGES/DRESSINGS)
CORDS BIPOLAR (ELECTRODE) ×3 IMPLANT
COVER MAYO STAND STRL (DRAPES) ×3 IMPLANT
COVER TABLE BACK 60X90 (DRAPES) ×3 IMPLANT
CUFF TOURNIQUET SINGLE 18IN (TOURNIQUET CUFF) ×2 IMPLANT
DECANTER SPIKE VIAL GLASS SM (MISCELLANEOUS) IMPLANT
DRAPE EXTREMITY T 121X128X90 (DRAPE) ×3 IMPLANT
DRAPE SURG 17X23 STRL (DRAPES) ×3 IMPLANT
DRSG EMULSION OIL 3X3 NADH (GAUZE/BANDAGES/DRESSINGS) ×3 IMPLANT
GAUZE SPONGE 4X4 12PLY STRL (GAUZE/BANDAGES/DRESSINGS) ×3 IMPLANT
GLOVE BIO SURGEON STRL SZ 6.5 (GLOVE) ×1 IMPLANT
GLOVE BIO SURGEON STRL SZ7.5 (GLOVE) ×2 IMPLANT
GLOVE BIO SURGEONS STRL SZ 6.5 (GLOVE) ×1
GLOVE BIOGEL M STRL SZ7.5 (GLOVE) ×3 IMPLANT
GLOVE BIOGEL PI IND STRL 7.0 (GLOVE) IMPLANT
GLOVE BIOGEL PI IND STRL 8 (GLOVE) IMPLANT
GLOVE BIOGEL PI INDICATOR 7.0 (GLOVE) ×2
GLOVE BIOGEL PI INDICATOR 8 (GLOVE) ×2
GLOVE ORTHO TXT STRL SZ7.5 (GLOVE) ×3 IMPLANT
GOWN STRL REUS W/ TWL LRG LVL3 (GOWN DISPOSABLE) ×1 IMPLANT
GOWN STRL REUS W/ TWL XL LVL3 (GOWN DISPOSABLE) ×2 IMPLANT
GOWN STRL REUS W/TWL LRG LVL3 (GOWN DISPOSABLE) ×3
GOWN STRL REUS W/TWL XL LVL3 (GOWN DISPOSABLE) ×9
LOOP VESSEL MAXI BLUE (MISCELLANEOUS) IMPLANT
NEEDLE 27GAX1X1/2 (NEEDLE) ×2 IMPLANT
NEEDLE HYPO 25X1 1.5 SAFETY (NEEDLE) IMPLANT
NS IRRIG 1000ML POUR BTL (IV SOLUTION) ×3 IMPLANT
PACK BASIN DAY SURGERY FS (CUSTOM PROCEDURE TRAY) ×3 IMPLANT
PAD CAST 3X4 CTTN HI CHSV (CAST SUPPLIES) ×1 IMPLANT
PADDING CAST ABS 4INX4YD NS (CAST SUPPLIES)
PADDING CAST ABS COTTON 4X4 ST (CAST SUPPLIES) IMPLANT
PADDING CAST COTTON 3X4 STRL (CAST SUPPLIES) ×3
SLEEVE SCD COMPRESS KNEE MED (MISCELLANEOUS) ×3 IMPLANT
SPLINT PLASTER CAST XFAST 3X15 (CAST SUPPLIES) ×8 IMPLANT
SPLINT PLASTER XTRA FASTSET 3X (CAST SUPPLIES) ×16
STOCKINETTE 4X48 STRL (DRAPES) ×3 IMPLANT
STRIP CLOSURE SKIN 1/2X4 (GAUZE/BANDAGES/DRESSINGS) IMPLANT
SUT ETHILON 5 0 P 3 18 (SUTURE) ×2
SUT NYLON ETHILON 5-0 P-3 1X18 (SUTURE) ×2 IMPLANT
SUT PROLENE 3 0 PS 2 (SUTURE) ×3 IMPLANT
SUT SILK 4 0 PS 2 (SUTURE) ×3 IMPLANT
SUT VIC AB 4-0 P2 18 (SUTURE) IMPLANT
SYR 3ML 23GX1 SAFETY (SYRINGE) IMPLANT
SYR BULB 3OZ (MISCELLANEOUS) ×3 IMPLANT
SYR CONTROL 10ML LL (SYRINGE) ×2 IMPLANT
TOWEL OR 17X24 6PK STRL BLUE (TOWEL DISPOSABLE) ×4 IMPLANT
TOWEL OR NON WOVEN STRL DISP B (DISPOSABLE) ×3 IMPLANT
TRAY DSU PREP LF (CUSTOM PROCEDURE TRAY) ×3 IMPLANT
UNDERPAD 30X30 INCONTINENT (UNDERPADS AND DIAPERS) ×3 IMPLANT

## 2014-05-02 NOTE — Op Note (Signed)
[id number] 

## 2014-05-02 NOTE — Anesthesia Postprocedure Evaluation (Signed)
  Anesthesia Post-op Note  Patient: Michele Green  Procedure(s) Performed: Procedure(s): EXCISION DUPUYTRENS RIGHT PALMAR/SMALL  (Right)  Patient Location: PACU  Anesthesia Type:General  Level of Consciousness: awake  Airway and Oxygen Therapy: Patient Spontanous Breathing  Post-op Pain: none  Post-op Assessment: Post-op Vital signs reviewed  Post-op Vital Signs: Reviewed  Last Vitals:  Filed Vitals:   05/02/14 1415  BP: 115/50  Pulse: 78  Temp:   Resp: 11    Complications: No apparent anesthesia complications

## 2014-05-02 NOTE — Transfer of Care (Signed)
Immediate Anesthesia Transfer of Care Note  Patient: Michele Green  Procedure(s) Performed: Procedure(s): EXCISION DUPUYTRENS RIGHT PALMAR/SMALL  (Right)  Patient Location: PACU  Anesthesia Type:General  Level of Consciousness: awake, alert , oriented and patient cooperative  Airway & Oxygen Therapy: Patient Spontanous Breathing and Patient connected to face mask oxygen  Post-op Assessment: Report given to PACU RN and Post -op Vital signs reviewed and stable  Post vital signs: Reviewed and stable  Complications: No apparent anesthesia complications

## 2014-05-02 NOTE — Anesthesia Procedure Notes (Signed)
Procedure Name: LMA Insertion Date/Time: 05/02/2014 12:07 PM Performed by: Toula Moos L Pre-anesthesia Checklist: Patient identified, Emergency Drugs available, Suction available, Patient being monitored and Timeout performed Patient Re-evaluated:Patient Re-evaluated prior to inductionOxygen Delivery Method: Circle System Utilized Preoxygenation: Pre-oxygenation with 100% oxygen Intubation Type: IV induction Ventilation: Mask ventilation without difficulty LMA: LMA inserted LMA Size: 4.0 Number of attempts: 1 Airway Equipment and Method: bite block Placement Confirmation: positive ETCO2 and breath sounds checked- equal and bilateral Tube secured with: Tape Dental Injury: Teeth and Oropharynx as per pre-operative assessment

## 2014-05-02 NOTE — Discharge Instructions (Signed)

## 2014-05-02 NOTE — Anesthesia Preprocedure Evaluation (Signed)
Anesthesia Evaluation  Patient identified by MRN, date of birth, ID band Patient awake    Reviewed: Allergy & Precautions, H&P , NPO status , Patient's Chart, lab work & pertinent test results, reviewed documented beta blocker date and time   Airway Mallampati: II TM Distance: >3 FB Neck ROM: full    Dental   Pulmonary neg pulmonary ROS, former smoker,  breath sounds clear to auscultation        Cardiovascular hypertension, On Medications Rhythm:regular     Neuro/Psych negative neurological ROS  negative psych ROS   GI/Hepatic negative GI ROS, Neg liver ROS,   Endo/Other  negative endocrine ROS  Renal/GU negative Renal ROS  negative genitourinary   Musculoskeletal   Abdominal   Peds  Hematology negative hematology ROS (+)   Anesthesia Other Findings See surgeon's H&P   Reproductive/Obstetrics negative OB ROS                           Anesthesia Physical Anesthesia Plan  ASA: II  Anesthesia Plan: General   Post-op Pain Management:    Induction: Intravenous  Airway Management Planned: LMA  Additional Equipment:   Intra-op Plan:   Post-operative Plan:   Informed Consent: I have reviewed the patients History and Physical, chart, labs and discussed the procedure including the risks, benefits and alternatives for the proposed anesthesia with the patient or authorized representative who has indicated his/her understanding and acceptance.   Dental Advisory Given  Plan Discussed with: CRNA and Surgeon  Anesthesia Plan Comments:         Anesthesia Quick Evaluation

## 2014-05-02 NOTE — Brief Op Note (Signed)
05/02/2014  1:12 PM  PATIENT:  Michele Green  73 y.o. female  PRE-OPERATIVE DIAGNOSIS:  RIGHT DUPUYTREN CONTRACTURE PALMAR/SMALL   POST-OPERATIVE DIAGNOSIS:  RIGHT DUPUYTREN CONTRACTURE PALMAR/SMALL   PROCEDURE:  Procedure(s): EXCISION DUPUYTRENS RIGHT PALMAR/SMALL  (Right)  SURGEON:  Surgeon(s) and Role:    * Cammie Sickle, MD - Primary  PHYSICIAN ASSISTANT:   ASSISTANTS: Kathyrn Sheriff.A-C   ANESTHESIA:   general  EBL:  Total I/O In: 1100 [I.V.:1100] Out: -   BLOOD ADMINISTERED:none  DRAINS: none   LOCAL MEDICATIONS USED:  LIDOCAINE   SPECIMEN:  Biopsy / Limited Resection  DISPOSITION OF SPECIMEN:  PATHOLOGY  COUNTS:  YES  TOURNIQUET:   Total Tourniquet Time Documented: Upper Arm (Right) - 47 minutes Total: Upper Arm (Right) - 47 minutes   DICTATION: .Other Dictation: Dictation Number [id number]  PLAN OF CARE: Discharge to home after PACU  PATIENT DISPOSITION:  PACU - hemodynamically stable.   Delay start of Pharmacological VTE agent (>24hrs) due to surgical blood loss or risk of bleeding: not applicable

## 2014-05-03 NOTE — Op Note (Signed)
NAME:  ARTEMIS, KOLLER NO.:  [id number]  MEDICAL RECORD NO.:  [id number]  LOCATION:                                 FACILITY:  PHYSICIAN:  Youlanda Mighty. Jshon Ibe, M.D. DATE OF BIRTH:  1941/08/25  DATE OF PROCEDURE:  05/02/2014 DATE OF DISCHARGE:  05/02/2014                              OPERATIVE REPORT   PREOPERATIVE DIAGNOSES:  Severe recurrent Dupuytren's palmar fibromatosis, contracture of right small finger involving the metacarpophalangeal joint, proximal interphalangeal joint, and distal interphalangeal joint.  POSTOPERATIVE DIAGNOSES:  Severe recurrent Dupuytren's palmar fibromatosis, contracture of right small finger involving the metacarpophalangeal joint, proximal interphalangeal joint, and distal interphalangeal joint.  OPERATION:  Repeat exploration, resection of pathologic fascia from right palm and small finger correcting a 45 degrees flexion contracture of the metacarpophalangeal joint, a 90 degree flexion contracture of the PIP joint, and a 40 degree flexion contracture of the DIP joint.  OPERATING SURGEON:  Youlanda Mighty. Trivia Heffelfinger, MD  ASSISTANT:  Marily Lente Dasnoit, PA-C  ANESTHESIA:  General by LMA.  SUPERVISING ANESTHESIOLOGIST:  Jessy Oto. Albertina Parr, MD  INDICATIONS:  Jaycelyn Orrison is a 73 year old woman who presented for evaluation of recurrent Dupuytren's palmar fibromatosis of her right and left hands in the past.  She has had prior surgery and had developed very severe recurrent flexion contractures.  Several years ago, we proceeded to resect the pathologic fascia from her left small finger and palm.  We are able to fully correct her deformity.  She now returned with very severe deformity of the right hand with the aforementioned 45 degrees flexion contracture of the metacarpophalangeal joint, a 90 degree flexion contracture of the PIP joint, and a 40 degree flexion fracture of the PIP joint.  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  We advised her that it would be appropriate to proceed with repeat resection.  She understands that repeat surgery is fraught with many potential complications including injury to nerves and arteries.  It is possible that she could lose skin flaps, develop infection, and could have numbness following this dissection despite our best efforts to safely remove the fascia.  The surgery and aftercare were detailed during our office consult. Questions were invited and answered in detail in the holding area preoperatively.  The right hand and small finger were marked as the proper surgical site per protocol with a marking pen.  Preoperatively, she was interviewed by Dr. Albertina Parr who recommended general anesthesia by LMA technique.  This was accepted by Ms. Nyce. 73 We anticipated a postoperative 2% lidocaine field block for comfort.  DESCRIPTION OF PROCEDURE:  Isaiah Torok was interviewed in the holding area.  Her palm and small finger marked as a proper surgical site.  She was transferred to room 2 of the [facility] or under Dr. Roslynn Amble direct supervision, general anesthesia by LMA technique was induced.  Her right arm and hand were prepped with Betadine soap and solution, sterilely draped.  A 2 g of Ancef were administered as an IV prophylactic antibiotic.  Following exsanguination of the right hand and arm with an Esmarch bandage,  arterial tourniquet was inflated to 220 mmHg.  The procedure commenced with planning of an ulnar-sided Brunner zigzag incision extending from the proximal palm to the DIP flexion crease. Skin flaps were extremely challenging to raise due to the prior surgical scar.  The ulnar neurovascular bundle was carefully identified in the palm and the extensive nodular and cord disease along the hypothenar eminence involving the abductor digiti minimi and lateral  fascial sheath and central cord were excised.  With meticulous dissection, we preserved the ulnar neurovascular structures.  We were able to correct the MP flexion contracture to 0 degrees, PIP to 0 degrees, and DIP to 5 degrees.  The wound was then inspected for bleeding points after release of the tourniquet.  Bipolar cautery under saline was used for hemostasis followed by direct pressure.  The skin flaps were reapportioned with V-Y advancement flap technique and a Z-plasty at the MP flexion crease.  The wound was satisfactorily closed.  She was dressed with Adaptic, Silvadene, sterile gauze, Kerlix, and a volar plaster splint maintaining extension of the MP and PIP joints with no apparent complications.     Youlanda Mighty Pearse Shiffler, M.D.     RVS/MEDQ  D:  05/02/2014  T:  05/03/2014  Job:  [id number]

## 2014-05-06 ENCOUNTER — Encounter (HOSPITAL_BASED_OUTPATIENT_CLINIC_OR_DEPARTMENT_OTHER): Payer: Self-pay | Admitting: Orthopedic Surgery

## 2014-08-31 ENCOUNTER — Encounter: Payer: Self-pay | Admitting: *Deleted

## 2014-09-05 ENCOUNTER — Telehealth: Payer: Self-pay | Admitting: Nurse Practitioner

## 2014-09-05 ENCOUNTER — Ambulatory Visit: Payer: Medicare Other | Admitting: Nurse Practitioner

## 2014-09-05 NOTE — Telephone Encounter (Signed)
LMTCB to reschedule AEX with Waldemar Dickens today.

## 2014-09-23 ENCOUNTER — Encounter: Payer: Self-pay | Admitting: *Deleted

## 2014-10-14 ENCOUNTER — Encounter: Payer: Self-pay | Admitting: Nurse Practitioner

## 2014-10-14 ENCOUNTER — Ambulatory Visit (INDEPENDENT_AMBULATORY_CARE_PROVIDER_SITE_OTHER): Payer: Medicare Other | Admitting: Nurse Practitioner

## 2014-10-14 VITALS — BP 108/58 | HR 76 | Ht 63.0 in | Wt 152.0 lb

## 2014-10-14 DIAGNOSIS — E559 Vitamin D deficiency, unspecified: Secondary | ICD-10-CM

## 2014-10-14 DIAGNOSIS — Z01419 Encounter for gynecological examination (general) (routine) without abnormal findings: Secondary | ICD-10-CM

## 2014-10-14 MED ORDER — PAROXETINE HCL 20 MG PO TABS: 20.0000 mg | ORAL_TABLET | Freq: Every day | ORAL | Status: DC

## 2014-10-14 MED ORDER — ERGOCALCIFEROL 1.25 MG (50000 UT) PO CAPS: 50000.0000 [IU] | ORAL_CAPSULE | ORAL | Status: DC

## 2014-10-14 NOTE — Patient Instructions (Signed)

## 2014-10-14 NOTE — Progress Notes (Signed)
73 y.o. G53P1001 Married Caucasian Fe here for annual exam.  Had right Dupuytren contracture of hand done in June.  No LMP recorded. Patient is postmenopausal.          Sexually active: No.  The current method of family planning is post menopausal status.    Exercising: Yes.    Home exercise routine includes walking a couples of miles a week. Smoker:  no  Health Maintenance: Pap:  08/27/10, WNL MMG:  03/15/14 Bi-Rads neg Colonoscopy:  02/2012 polyp BMD:   2013 per pt, low bone mass followed at PCP TDaP:  2012 Labs: PCP                    UA: PCP   reports that she quit smoking about 21 years ago. Her smoking use included Cigarettes. She has a 20 pack-year smoking history. She has never used smokeless tobacco. She reports that she drinks about 1.5 oz of alcohol per week. She reports that she does not use illicit drugs.  Past Medical History  Diagnosis Date  . Osteopenia   . Hypertension   . Colon polyp 02/2012  . Colitis   . Dupuytren contracture     bilateral hands  . Wears glasses   . Arthritis     Past Surgical History  Procedure Laterality Date  . Dupuytren contracture release  2001    leftx2  . Tonsillectomy  age 49  . Dupuytren contracture release  1990    right  . Colonoscopy    . Dupuytren contracture release Right 05/02/2014    Procedure: EXCISION DUPUYTRENS RIGHT PALMAR/SMALL ;  Surgeon: Cammie Sickle, MD;  Location: [facility];  Service: Orthopedics;  Laterality: Right;    Current Outpatient Prescriptions  Medication Sig Dispense Refill  . allopurinol (ZYLOPRIM) 100 MG tablet Take 1 tablet by mouth 2 (two) times daily.    [name] atenolol (TENORMIN) 50 MG tablet Take 1 tablet by mouth daily.    [name] CALCIUM PO Take by mouth.    . ergocalciferol (VITAMIN D2) 50000 UNITS capsule Take 1 capsule (50,000 Units total) by mouth once a week. 30 capsule 3  . LIALDA 1.2 G EC tablet Take 1 tablet by mouth 4 (four) times daily.     [name] PARoxetine (PAXIL) 20 MG tablet  Take 1 tablet (20 mg total) by mouth daily. 90 tablet 3  . simvastatin (ZOCOR) 20 MG tablet Take 1 tablet by mouth daily.     No current facility-administered medications for this visit.    Family History  Problem Relation Age of Onset  . Cirrhosis Mother   . Heart failure Father   . Breast cancer Sister 18  . Kidney Stones Sister     loss of kidney due to stones    ROS:  Pertinent items are noted in HPI.  Otherwise, a comprehensive ROS was negative.  Exam:   BP 108/58 mmHg  Pulse 76  Ht 5' 3"  (1.6 m)  Wt 152 lb (68.947 kg)  BMI 26.93 kg/m2 Height: 5' 3"  (160 cm)  Ht Readings from Last 3 Encounters:  10/14/14 5' 3"  (1.6 m)  05/02/14 5' 4"  (1.626 m)  11/07/13 5' 3.75" (1.619 m)    General appearance: alert, cooperative and appears stated age Head: Normocephalic, without obvious abnormality, atraumatic Neck: no adenopathy, supple, symmetrical, trachea midline and thyroid normal to inspection and palpation Lungs: clear to auscultation bilaterally Breasts: normal appearance, no masses or tenderness Heart: regular rate and rhythm  Abdomen: soft, non-tender; no masses,  no organomegaly Extremities: extremities normal, atraumatic, no cyanosis or edema Skin: Skin color, texture, turgor normal. No rashes or lesions Lymph nodes: Cervical, supraclavicular, and axillary nodes normal. No abnormal inguinal nodes palpated Neurologic: Grossly normal   Pelvic: External genitalia:  no lesions              Urethra:  normal appearing urethra with no masses, tenderness or lesions              Bartholin's and Skene's: normal                 Vagina: normal appearing vagina with normal color and discharge, no lesions              Cervix: anteverted              Pap taken: No. Bimanual Exam:  Uterus:  normal size, contour, position, consistency, mobility, non-tender              Adnexa: no mass, fullness, tenderness               Rectovaginal: Confirms               Anus:  normal sphincter  tone, no lesions  A:  Well Woman with normal exam  Postmenopausal - no HRT Osteopenia - followed by PCP, off Fosamax since 12/09 History of chronic anxiety - stable on med's History of Colitis, HTN  P:   Reviewed health and wellness pertinent to exam  Pap smear not taken today  Mammogram is due 4/16  Refill on Paxil 20 mg daily for a year  Refill on Vit D and will follow with labs  She will check with PCP about taking over these two RX since her Medicare cost is so expensive to come here.  She then will come here prn.   Counseled on breast self exam, mammography screening, adequate intake of calcium and vitamin D, diet and exercise, Kegel's exercises return annually or prn  An After Visit Summary was printed and given to the patient.  Will get copy of BMD for our records.

## 2014-10-15 ENCOUNTER — Telehealth: Payer: Self-pay | Admitting: *Deleted

## 2014-10-15 LAB — VITAMIN D 25 HYDROXY (VIT D DEFICIENCY, FRACTURES): Vit D, 25-Hydroxy: 34 ng/mL (ref 30–100)

## 2014-10-15 NOTE — Telephone Encounter (Signed)
Pt notified in result note.  Closing encounter. 

## 2014-10-15 NOTE — Telephone Encounter (Signed)
I have attempted to contact this patient by phone with the following results: left message to return call to Kimberly at [phone] answering machine (home per [facility]).  No personal information given.  [phone] (Home) *Preferred*

## 2014-10-15 NOTE — Telephone Encounter (Signed)
-----   Message from Milford Cage, [place] sent at 10/15/2014  8:36 AM EST ----- Let patient know that Vit D level is 34 - have her to continue Vit D as usual.

## 2014-10-16 NOTE — Progress Notes (Signed)
Reviewed personally.  M. Suzanne Zadiel Leyh, MD.  

## 2014-10-22 ENCOUNTER — Telehealth: Payer: Self-pay | Admitting: Nurse Practitioner

## 2014-10-22 NOTE — Telephone Encounter (Signed)
Results of BMD done at [facility] on 03/08/13.  The T Score of the spine is at -1.2 and the left hip neck is at -2.3.  The hip range falls in the borderline range of osteoporosis.  The report says she was on Fosamax from 2007 - 2010. Report is scanned into Epic.

## 2014-11-01 ENCOUNTER — Ambulatory Visit: Payer: Medicare Other | Admitting: Interventional Cardiology

## 2014-12-12 ENCOUNTER — Ambulatory Visit (INDEPENDENT_AMBULATORY_CARE_PROVIDER_SITE_OTHER): Payer: Medicare Other | Admitting: Interventional Cardiology

## 2014-12-12 ENCOUNTER — Encounter: Payer: Self-pay | Admitting: Interventional Cardiology

## 2014-12-12 VITALS — BP 124/68 | HR 68 | Ht 63.0 in | Wt 152.4 lb

## 2014-12-12 DIAGNOSIS — I340 Nonrheumatic mitral (valve) insufficiency: Secondary | ICD-10-CM

## 2014-12-12 DIAGNOSIS — I10 Essential (primary) hypertension: Secondary | ICD-10-CM

## 2014-12-12 DIAGNOSIS — E785 Hyperlipidemia, unspecified: Secondary | ICD-10-CM

## 2014-12-12 NOTE — Patient Instructions (Signed)
Your physician recommends that you continue on your current medications as directed. Please refer to the Current Medication list given to you today.  Your physician has requested that you have an echocardiogram. Echocardiography is a painless test that uses sound waves to create images of your heart. It provides your doctor with information about the size and shape of your heart and how well your heart's chambers and valves are working. This procedure takes approximately one hour. There are no restrictions for this procedure.  Please call to schedule your echocardiogram for Feb 2017   Your physician wants you to follow-up in: 1 year. You will receive a reminder letter in the mail two months in advance. If you don't receive a letter, please call our office to schedule the follow-up appointment.

## 2014-12-12 NOTE — Progress Notes (Signed)
Patient ID: Michele Green, female   DOB: September 01, 1941, 74 y.o.   MRN: [id number]    [address] [place], [place]  [address] Phone: [phone] Fax:  [phone]  Date:  12/12/2014   ID:  Michele Green, DOB 17-Mar-1941, MRN [id number]  PCP:  [name], MD   ASSESSMENT:  1. Moderate mitral regurgitation, clinically unchanged without heart failure symptoms 2. Hypertension, controlled 3. Hyperlipidemia  PLAN:  1. 2-D Doppler echocardiogram in February/March 2016 and repeat in 2017 2. Clinical follow-up in 2017, February/March 1-2 weeks after the 2017 echo was performed 3. Will call about this he is echocardiogram and she may need to be seen earlier if there is any evidence of LV enlargement or deterioration   SUBJECTIVE: Michele Green is a 74 y.o. female who notices some exertional fatigue. She denies orthopnea and PND. She has not had palpitations or syncope. She denies chills and fever. Appetite is been stable.   Wt Readings from Last 3 Encounters:  12/12/14 152 lb 6.4 oz (69.128 kg)  10/14/14 152 lb (68.947 kg)  05/02/14 145 lb 2 oz (65.828 kg)     Past Medical History  Diagnosis Date  . Osteopenia   . Hypertension   . Colon polyp 02/2012  . Colitis   . Dupuytren contracture     bilateral hands  . Wears glasses   . Arthritis     Current Outpatient Prescriptions  Medication Sig Dispense Refill  . allopurinol (ZYLOPRIM) 100 MG tablet Take 1 tablet by mouth 2 (two) times daily.    [name] atenolol (TENORMIN) 50 MG tablet Take 1 tablet by mouth daily.    [name] CALCIUM PO Take by mouth.    . ergocalciferol (VITAMIN D2) 50000 UNITS capsule Take 1 capsule (50,000 Units total) by mouth once a week. 30 capsule 3  . LIALDA 1.2 G EC tablet Take 1 tablet by mouth 4 (four) times daily.     [name] PARoxetine (PAXIL) 20 MG tablet Take 1 tablet (20 mg total) by mouth daily. 90 tablet 3  . simvastatin (ZOCOR) 20 MG tablet Take 1 tablet by mouth daily.     No current  facility-administered medications for this visit.    Allergies:   No Known Allergies  Social History:  The patient  reports that she quit smoking about 22 years ago. Her smoking use included Cigarettes. She has a 20 pack-year smoking history. She has never used smokeless tobacco. She reports that she drinks about 1.5 oz of alcohol per week. She reports that she does not use illicit drugs.   ROS:  Please see the history of present illness.   No peripheral edema. No chest discomfort.   All other systems reviewed and negative.   OBJECTIVE: VS:  BP 124/68 mmHg  Pulse 68  Ht 5' 3"  (1.6 m)  Wt 152 lb 6.4 oz (69.128 kg)  BMI 27.00 kg/m2 Well nourished, well developed, in no acute distress, dusky appearing nose HEENT: normal Neck: JVD flat while lying at 30. Carotid bruit absent  Cardiac:  normal S1, S2; RRR; 3/6 systolic murmur at the left lower sternal border, apex and into the left axilla Lungs:  clear to auscultation bilaterally, no wheezing, rhonchi or rales Abd: soft, nontender, no hepatomegaly Ext: Edema absent. Pulses 2+ Skin: warm and dry Neuro:  CNs 2-12 intact, no focal abnormalities noted  EKG:  Normal sinus rhythm with nonspecific T-wave abnormality.       Signed, Illene Labrador  III, MD 12/12/2014 2:11 PM

## 2015-01-13 ENCOUNTER — Ambulatory Visit (HOSPITAL_COMMUNITY): Payer: Medicare Other | Attending: Cardiovascular Disease | Admitting: Radiology

## 2015-01-13 DIAGNOSIS — I340 Nonrheumatic mitral (valve) insufficiency: Secondary | ICD-10-CM | POA: Diagnosis not present

## 2015-01-13 NOTE — Progress Notes (Signed)
Echocardiogram performed.  

## 2015-01-14 ENCOUNTER — Telehealth: Payer: Self-pay

## 2015-01-14 NOTE — Telephone Encounter (Signed)
-----   Message from Sinclair Grooms, MD sent at 01/13/2015  7:17 PM EST ----- Very stable and only mild leak of valve.

## 2015-01-14 NOTE — Telephone Encounter (Signed)
pt aware of echo results.Very stable and only mild leak of valve.pt verbalized understanding.

## 2015-08-12 ENCOUNTER — Telehealth: Payer: Self-pay | Admitting: Interventional Cardiology

## 2015-08-12 NOTE — Telephone Encounter (Signed)
Returned call to [employer] Dr.Ehinger's office.she was out to lunch. Left message with Jacqlyn Larsen Faxed received of pt EKG and o/v today. Dr.Smith reviewed. Pt appt scheduled for 9/30 @ 10:30am. Pt aware of appt date and time. Pt sts that she was given a 10day supply of Xarleto. Pt will need enough to get her through her o/v on 9/30 Xarelto samples left at the front desk for pt to  pick up. Pt sts that she is doing ok. Adv her to call the office if symptoms develop. Pt verbalized understanding.

## 2015-08-12 NOTE — Telephone Encounter (Signed)
New Message   Dianna from Dr. Marisue Humble office is calling to report pt Diagnosis currently  New Onset A-Fib at a Rapid Ventricular Rate with Dyspnea   Wanted to see if pt can have the hold for 9/28@ 4:15pm  Rn is expecting you call.

## 2015-08-22 ENCOUNTER — Encounter: Payer: Self-pay | Admitting: Interventional Cardiology

## 2015-08-22 ENCOUNTER — Ambulatory Visit (INDEPENDENT_AMBULATORY_CARE_PROVIDER_SITE_OTHER): Payer: Medicare Other | Admitting: Interventional Cardiology

## 2015-08-22 VITALS — BP 108/64 | HR 89 | Ht 63.0 in | Wt 148.0 lb

## 2015-08-22 DIAGNOSIS — I340 Nonrheumatic mitral (valve) insufficiency: Secondary | ICD-10-CM

## 2015-08-22 DIAGNOSIS — E785 Hyperlipidemia, unspecified: Secondary | ICD-10-CM

## 2015-08-22 DIAGNOSIS — I10 Essential (primary) hypertension: Secondary | ICD-10-CM

## 2015-08-22 DIAGNOSIS — I481 Persistent atrial fibrillation: Secondary | ICD-10-CM | POA: Diagnosis not present

## 2015-08-22 DIAGNOSIS — I4819 Other persistent atrial fibrillation: Secondary | ICD-10-CM

## 2015-08-22 DIAGNOSIS — Z7901 Long term (current) use of anticoagulants: Secondary | ICD-10-CM | POA: Insufficient documentation

## 2015-08-22 HISTORY — DX: Long term (current) use of anticoagulants: Z79.01

## 2015-08-22 HISTORY — DX: Other persistent atrial fibrillation: I48.19

## 2015-08-22 MED ORDER — AMIODARONE HCL 200 MG PO TABS: 200.0000 mg | ORAL_TABLET | Freq: Two times a day (BID) | ORAL | Status: DC

## 2015-08-22 MED ORDER — DIGOXIN 62.5 MCG PO TABS: 0.0625 mg | ORAL_TABLET | Freq: Every day | ORAL | Status: DC

## 2015-08-22 MED ORDER — DIGOXIN 125 MCG PO TABS: 0.0625 mg | ORAL_TABLET | Freq: Every day | ORAL | Status: DC

## 2015-08-22 MED ORDER — RIVAROXABAN 20 MG PO TABS: 20.0000 mg | ORAL_TABLET | Freq: Every day | ORAL | Status: DC

## 2015-08-22 NOTE — Progress Notes (Signed)
Cardiology Office Note   Date:  08/22/2015   ID:  Michele Green, DOB October 25, 1941, MRN [id number]  PCP:  [name], MD  Cardiologist:  Sinclair Grooms, MD   Chief Complaint  Patient presents with  . Cardiac Valve Problem    mitral regurg      History of Present Illness: Michele Green is a 74 y.o. female who presents for neuro said A. Fib within the past month, diastolic CHF, exertional fatigue, and history of mitral regurgitation..  Atrial fibrillation probably occurred in late August early September when she began feeling fatigued and complained of exertional dyspnea and fatigue. She was seen by Dr. Marisue Humble on September 20 where atrial fib was identified, treated with digoxin 0.125 mg daily and anticoagulation therapy with Xarelto was started. Since starting digoxin, she has felt progressively better. She denies chest discomfort. No episodes of syncope. Exertional dyspnea and fatigue have significantly improved but still causes some limitations. She is not back to her usual exertional baseline. She denies orthopnea. She has not had anginal quality chest pain.  Past Medical History  Diagnosis Date  . Osteopenia   . Hypertension   . Colon polyp 02/2012  . Colitis   . Dupuytren contracture     bilateral hands  . Wears glasses   . Arthritis   . Persistent atrial fibrillation 08/22/2015    Started late August or early September.   . On continuous oral anticoagulation 08/22/2015    Started on Xarelto 08/12/2015     Past Surgical History  Procedure Laterality Date  . Dupuytren contracture release  2001    leftx2  . Tonsillectomy  age 58  . Dupuytren contracture release  1990    right  . Colonoscopy    . Dupuytren contracture release Right 05/02/2014    Procedure: EXCISION DUPUYTRENS RIGHT PALMAR/SMALL ;  Surgeon: Cammie Sickle, MD;  Location: [facility];  Service: Orthopedics;  Laterality: Right;     Current Outpatient Prescriptions    Medication Sig Dispense Refill  . allopurinol (ZYLOPRIM) 100 MG tablet Take 1 tablet by mouth 2 (two) times daily.    [name] atenolol (TENORMIN) 50 MG tablet Take 1 tablet by mouth daily.    [name] CALCIUM PO Take by mouth daily.     . digoxin 62.5 MCG TABS Take 0.0625 mg by mouth daily. 90 tablet 1  . ergocalciferol (VITAMIN D2) 50000 UNITS capsule Take 1 capsule (50,000 Units total) by mouth once a week. 30 capsule 3  . LIALDA 1.2 G EC tablet Take 1 tablet by mouth 4 (four) times daily.     . rivaroxaban (XARELTO) 20 MG TABS tablet Take 1 tablet (20 mg total) by mouth daily with supper. 90 tablet 1  . simvastatin (ZOCOR) 20 MG tablet Take 1 tablet by mouth daily.    [name] amiodarone (PACERONE) 200 MG tablet Take 1 tablet (200 mg total) by mouth 2 (two) times daily. 180 tablet 1   No current facility-administered medications for this visit.    Allergies:   Review of patient's allergies indicates no known allergies.    Social History:  The patient  reports that she quit smoking about 22 years ago. Her smoking use included Cigarettes. She has a 20 pack-year smoking history. She has never used smokeless tobacco. She reports that she drinks about 1.5 oz of alcohol per week. She reports that she does not use illicit drugs.   Family History:  The patient's family history includes  Breast cancer (age of onset: 12) in her sister; Cirrhosis in her mother; Heart failure in her father; Kidney Stones in her sister.    ROS:  Please see the history of present illness.   Otherwise, review of systems are positive for shortness of breath with activity.   All other systems are reviewed and negative.    PHYSICAL EXAM: VS:  BP 108/64 mmHg  Pulse 89  Ht _0  (1.6 m)  Wt 67.132 kg (148 lb)  BMI 26.22 kg/m2 , BMI Body mass index is 26.22 kg/(m^2). GEN: Well nourished, well developed, in no acute distress HEENT: normal Neck: no JVD, carotid bruits, or masses Cardiac: IIRR.  There is no murmur, rub, or gallop. There  is no edema. Respiratory:  clear to auscultation bilaterally, normal work of breathing. GI: soft, nontender, nondistended, + BS MS: no deformity or atrophy Skin: warm and dry, no rash Neuro:  Strength and sensation are intact Psych: euthymic mood, full affect   EKG:  EKG his ordered today. The ekg reveals atrial fibrillation with moderate rate control and nonspecific ST-T wave change.   Recent Labs: No results found for requested labs within last 365 days.    Lipid Panel No results found for: CHOL, TRIG, HDL, CHOLHDL, VLDL, LDLCALC, LDLDIRECT    Wt Readings from Last 3 Encounters:  08/22/15 67.132 kg (148 lb)  12/12/14 69.128 kg (152 lb 6.4 oz)  10/14/14 68.947 kg (152 lb)      Other studies Reviewed: Additional studies/ records that were reviewed today include: review of the usual records. We noted that blood work was stumbled we do not have copies of it.. The findings include we need the TSH and be met values..    ASSESSMENT AND PLAN:  1. Persistent atrial fibrillation Rate control on digoxin. We discussed atrial fibrillation and management concerns. We discussed the importance of anticoagulation. We discussed the importance of resumption of rhythm to improve symptoms in her particular situation. We discussed the likely need for electrical cardioversion.  2. Mitral regurgitation Mild mitral regurgitation on recent echo  3. Essential hypertension controlled  4. Hyperlipidemia Not addressed  5. Anticoagulation Xarelto was started approximately 10 days ago.   Current medicines are reviewed at length with the patient today.  The patient has the following concerns regarding medicines: None.  The following changes/actions have been instituted:    Amiodarone 200 mg twice a day  Decrease digoxin to 0.0625 mg daily  Follow-up in 2-3 weeks to arrange electrical cardioversion if still in atrial fibrillation  Reassess mitral regurgitation with 2-D Doppler  echocardiogram after cardioversion.  She will have been on anticoagulation therapy for a total of 4 weeks on October 20.  Labs/ tests ordered today include:   Orders Placed This Encounter  Procedures  . EKG 12-Lead     Disposition:   FU with HS in 1 month  Signed, Sinclair Grooms, MD  08/22/2015 1:18 PM    [facility] [address], [place], [place]  [address] Phone: [phone]; Fax: [phone]

## 2015-08-22 NOTE — Patient Instructions (Addendum)
Medication Instructions:  Your physician has recommended you make the following change in your medication:  1) START Amiodarone 233m twice daily. An Rx has been sent to your pharmacy 2) When your start Amiodarone, REDUCE Digoxin to 1/2 tablet (0.06270m daily  An Rx has been sen to you pharmacy for Xarelto 2034maily   Labwork: None ordered    Testing/Procedures: None ordered    Follow-Up: You have a follow up appointment scheduled on 09/10/15 @ 9am    Any Other Special Instructions Will Be Listed Below (If Applicable).

## 2015-08-24 ENCOUNTER — Other Ambulatory Visit: Payer: Self-pay | Admitting: Nurse Practitioner

## 2015-08-25 ENCOUNTER — Encounter: Payer: Self-pay | Admitting: Interventional Cardiology

## 2015-08-25 NOTE — Telephone Encounter (Addendum)
Medication refill request: Vitamin D Last AEX:  10/14/14 PG Next AEX: none Last MMG (if hormonal medication request): 03/15/14 BIRADS1:neg Refill authorized: 10/14/14 #30caps/ 3R. Today   Left Voicemail for pt to call back re: AEX

## 2015-08-26 ENCOUNTER — Telehealth: Payer: Self-pay

## 2015-08-26 NOTE — Telephone Encounter (Signed)
Prior auth for Xarelto 20mg sent to [facility]. 

## 2015-08-28 ENCOUNTER — Telehealth: Payer: Self-pay

## 2015-08-28 NOTE — Telephone Encounter (Signed)
Xarelto approved through [employer]. [id number]. Good through 08/25/2016.

## 2015-09-09 DIAGNOSIS — Z79899 Other long term (current) drug therapy: Secondary | ICD-10-CM | POA: Insufficient documentation

## 2015-09-10 ENCOUNTER — Ambulatory Visit (INDEPENDENT_AMBULATORY_CARE_PROVIDER_SITE_OTHER): Payer: Medicare Other | Admitting: Interventional Cardiology

## 2015-09-10 ENCOUNTER — Encounter: Payer: Self-pay | Admitting: Interventional Cardiology

## 2015-09-10 DIAGNOSIS — Z7901 Long term (current) use of anticoagulants: Secondary | ICD-10-CM | POA: Diagnosis not present

## 2015-09-10 DIAGNOSIS — I10 Essential (primary) hypertension: Secondary | ICD-10-CM

## 2015-09-10 DIAGNOSIS — I481 Persistent atrial fibrillation: Secondary | ICD-10-CM

## 2015-09-10 DIAGNOSIS — E785 Hyperlipidemia, unspecified: Secondary | ICD-10-CM

## 2015-09-10 DIAGNOSIS — Z79899 Other long term (current) drug therapy: Secondary | ICD-10-CM

## 2015-09-10 DIAGNOSIS — I4819 Other persistent atrial fibrillation: Secondary | ICD-10-CM

## 2015-09-10 DIAGNOSIS — I340 Nonrheumatic mitral (valve) insufficiency: Secondary | ICD-10-CM

## 2015-09-10 MED ORDER — AMIODARONE HCL 200 MG PO TABS: 200.0000 mg | ORAL_TABLET | Freq: Every day | ORAL | Status: DC

## 2015-09-10 NOTE — Progress Notes (Signed)
Cardiology Office Note   Date:  09/10/2015   ID:  Michele Green, DOB 12-18-1940, MRN [id number]  PCP:  [name], MD  Cardiologist:  Sinclair Grooms, MD   Chief Complaint  Patient presents with  . Atrial Fibrillation    set up cardioversion if still AF  . Medication Problem    on amiodarone      History of Present Illness: Michele Green is a 74 y.o. female who presents for hypertension, persistent atrial fibrillation, mild mitral regurgitation, and anticoagulation therapy. Also known to have hyperlipidemia.  Atrial fibrillation probably occurred in late August early September when she began feeling fatigued and complained of exertional dyspnea and fatigue. She was seen by Dr. Marisue Humble on September 20 where atrial fib was identified, treated with digoxin 0.125 mg daily and anticoagulation therapy with Xarelto was started. Since starting digoxin, she has felt progressively better. She denies chest discomfort. No episodes of syncope. Exertional dyspnea and fatigue have significantly improved but still causes some limitations. She is not back to her usual exertional baseline. She denies orthopnea. She has not had anginal quality chest pain.  Since starting low-dose digoxin and amiodarone, she feels better. Exertional tolerance has improved. She is still way below her usual level of energy and exertional tolerance. In addition she complains of bilateral lower extremity weakness and itching on the bottom of her feet. She had tripped his status to medications. She has had no bleeding on the current anticoagulation regimen. She is had no neurological complaints. She denies syncope.  Past Medical History  Diagnosis Date  . Osteopenia   . Hypertension   . Colon polyp 02/2012  . Colitis   . Dupuytren contracture     bilateral hands  . Wears glasses   . Arthritis   . Persistent atrial fibrillation ([place]) 08/22/2015    Started late August or early September.   . On continuous  oral anticoagulation 08/22/2015    Started on Xarelto 08/12/2015     Past Surgical History  Procedure Laterality Date  . Dupuytren contracture release  2001    leftx2  . Tonsillectomy  age 31  . Dupuytren contracture release  1990    right  . Colonoscopy    . Dupuytren contracture release Right 05/02/2014    Procedure: EXCISION DUPUYTRENS RIGHT PALMAR/SMALL ;  Surgeon: Cammie Sickle, MD;  Location: [facility];  Service: Orthopedics;  Laterality: Right;     Current Outpatient Prescriptions  Medication Sig Dispense Refill  . allopurinol (ZYLOPRIM) 100 MG tablet Take 1 tablet by mouth 2 (two) times daily.    [name] amiodarone (PACERONE) 200 MG tablet Take 1 tablet (200 mg total) by mouth 2 (two) times daily. 180 tablet 1  . atenolol (TENORMIN) 50 MG tablet Take 1 tablet by mouth daily.    [name] CALCIUM PO Take 1 tablet by mouth daily.     . digoxin 62.5 MCG TABS Take 0.0625 mg by mouth daily. 90 tablet 1  . LIALDA 1.2 G EC tablet Take 1 tablet by mouth 4 (four) times daily.     . rivaroxaban (XARELTO) 20 MG TABS tablet Take 1 tablet (20 mg total) by mouth daily with supper. 90 tablet 1  . simvastatin (ZOCOR) 20 MG tablet Take 1 tablet by mouth daily.    . Vitamin D, Ergocalciferol, (DRISDOL) 50000 UNITS CAPS capsule Take 1 capsule by mouth  weekly 30 capsule 0   No current facility-administered medications for this visit.  Allergies:   Review of patient's allergies indicates no known allergies.    Social History:  The patient  reports that she quit smoking about 22 years ago. Her smoking use included Cigarettes. She has a 20 pack-year smoking history. She has never used smokeless tobacco. She reports that she drinks about 1.5 oz of alcohol per week. She reports that she does not use illicit drugs.   Family History:  The patient's family history includes Breast cancer (age of onset: 68) in her sister; Cirrhosis in her mother; Heart failure in her father; Kidney Stones in  her sister.    ROS:  Please see the history of present illness.   Otherwise, review of systems are positive for no blood in the urine or stool. Occasional irregular heartbeat. Otherwise no complaints other than as stated above..   All other systems are reviewed and negative.    PHYSICAL EXAM: VS:  BP 114/80 mmHg  Pulse 87  Ht 5' 3"  (1.6 m)  Wt 67.042 kg (147 lb 12.8 oz)  BMI 26.19 kg/m2 , BMI Body mass index is 26.19 kg/(m^2). GEN: Well nourished, well developed, in no acute distress HEENT: normal Neck: no JVD, carotid bruits, or masses Cardiac: IIRR.  There is a 2/6 systolic murmur, rub, or gallop. There is no edema. Respiratory:  clear to auscultation bilaterally, normal work of breathing. GI: soft, nontender, nondistended, + BS MS: no deformity or atrophy Skin: warm and dry, no rash Neuro:  Strength and sensation are intact Psych: euthymic mood, full affect   EKG:  EKG is ordered today. The ekg reveals atrial fib with rate control. There are nonspecific ST-T wave changes noted. Chest abnormality is consistent with dig effect.   Recent Labs: No results found for requested labs within last 365 days.    Lipid Panel No results found for: CHOL, TRIG, HDL, CHOLHDL, VLDL, LDLCALC, LDLDIRECT    Wt Readings from Last 3 Encounters:  09/10/15 67.042 kg (147 lb 12.8 oz)  08/22/15 67.132 kg (148 lb)  12/12/14 69.128 kg (152 lb 6.4 oz)      Other studies Reviewed: Additional studies/ records that were reviewed today include: None. The findings include none which are new..    ASSESSMENT AND PLAN:  1. Persistent atrial fibrillation (HCC) Persists but with rate control on low-dose dig, amiodarone, and atenolol.  2. Essential hypertension Controlled  3. On amiodarone therapy Possible side effects  4. On continuous oral anticoagulation No bleeding  5. Hyperlipidemia Not discussed  6. Mitral regurgitation Not discussed    Current medicines are reviewed at length  with the patient today.  The patient has the following concerns regarding medicines: Whether lower extremity weakness and itching is side effect of her medications..  The following changes/actions have been instituted:    Continue 400 mg of amiodarone per day for another week then decrease to 200 mg daily  Electrical cardioversion in mid November  Laboratory data and ECG prior to cardioversion  Follow-up with me in one to 2 weeks post conversion.  We had a long discussion concerning medication management post conversion. We discussed the likely long-term use of anticoagulation therapy. We will try to discontinue amiodarone.  Labs/ tests ordered today include:  No orders of the defined types were placed in this encounter.     Disposition:   FU with HS in 6 weeks  Signed, Sinclair Grooms, MD  09/10/2015 9:15 AM    [place], [place]  [address] Phone: [phone]; Fax: [phone]

## 2015-09-10 NOTE — Patient Instructions (Addendum)
Medication Instructions:  Your physician has recommended you make the following change in your medication:  On 09/16/15 REDUCE Amiodarone to 266m daily  DO NOT take Digoxin the morning test   Labwork: Your physician recommends that you return for lab work the week of 09/29/15 ([facility]) ( same day as EKG)  Your physician recommends that you schedule a follow-up appointment the week of  09/29/15 for a nurse EKG   Testing/Procedures: Your physician has recommended that you have a Cardioversion (DCCV). Electrical Cardioversion uses a jolt of electricity to your heart either through paddles or wired patches attached to your chest. This is a controlled, usually prescheduled, procedure. Defibrillation is done under light anesthesia in the hospital, and you usually go home the day of the procedure. This is done to get your heart back into a normal rhythm. You are not awake for the procedure. Please see the instruction sheet given to you today.   Follow-Up: You have follow up appointment with Dr.Smith on 10/21/15 @ 9:45am Any Other Special Instructions Will Be Listed Below (If Applicable).

## 2015-09-29 ENCOUNTER — Other Ambulatory Visit (INDEPENDENT_AMBULATORY_CARE_PROVIDER_SITE_OTHER): Payer: Medicare Other | Admitting: *Deleted

## 2015-09-29 ENCOUNTER — Ambulatory Visit (INDEPENDENT_AMBULATORY_CARE_PROVIDER_SITE_OTHER): Payer: Medicare Other

## 2015-09-29 DIAGNOSIS — I481 Persistent atrial fibrillation: Secondary | ICD-10-CM

## 2015-09-29 DIAGNOSIS — I4819 Other persistent atrial fibrillation: Secondary | ICD-10-CM

## 2015-09-29 LAB — BASIC METABOLIC PANEL
BUN: 11 mg/dL (ref 7–25)
CHLORIDE: 106 mmol/L (ref 98–110)
CO2: 23 mmol/L (ref 20–31)
CREATININE: 0.82 mg/dL (ref 0.60–0.93)
Calcium: 8.9 mg/dL (ref 8.6–10.4)
Glucose, Bld: 92 mg/dL (ref 65–99)
POTASSIUM: 4.1 mmol/L (ref 3.5–5.3)
SODIUM: 141 mmol/L (ref 135–146)

## 2015-09-29 NOTE — Progress Notes (Signed)
The pt arrives in the office for a EKG.  She is scheduled to have a cardioversion on 10/02/15 with Dr Marlou Porch.  EKG obtained and given to Truitt Merle, NP ([facility]) for her review.  Per Truitt Merle, NP the pt continues to be in A-fib.  The pt is advised that her cardioversion will proceed as planned.  She verbalized understanding and is in agreement with plan.  Also, I printed her Cardioversion instructions and gave them to her so she would have a written copy for reference.

## 2015-09-30 ENCOUNTER — Telehealth: Payer: Self-pay

## 2015-09-30 NOTE — Telephone Encounter (Signed)
-----   Message from [name], MD sent at 09/29/2015 10:27 PM EST ----- Normal

## 2015-09-30 NOTE — Telephone Encounter (Signed)
Pt aware of lab results with verbal understanding. Labs are normal

## 2015-10-02 ENCOUNTER — Encounter (HOSPITAL_COMMUNITY): Payer: Self-pay | Admitting: *Deleted

## 2015-10-02 ENCOUNTER — Ambulatory Visit (HOSPITAL_COMMUNITY): Payer: Medicare Other | Admitting: Anesthesiology

## 2015-10-02 ENCOUNTER — Ambulatory Visit (HOSPITAL_COMMUNITY)
Admission: RE | Admit: 2015-10-02 | Discharge: 2015-10-02 | Disposition: A | Discharge status: Home / Self Care | Payer: Medicare Other | Source: Ambulatory Visit | Attending: Cardiology | Admitting: Cardiology

## 2015-10-02 ENCOUNTER — Encounter (HOSPITAL_COMMUNITY)
Admission: RE | Disposition: A | Discharge status: Home / Self Care | Payer: Self-pay | Source: Ambulatory Visit | Attending: Cardiology

## 2015-10-02 DIAGNOSIS — Z7901 Long term (current) use of anticoagulants: Secondary | ICD-10-CM | POA: Diagnosis not present

## 2015-10-02 DIAGNOSIS — I10 Essential (primary) hypertension: Secondary | ICD-10-CM | POA: Insufficient documentation

## 2015-10-02 DIAGNOSIS — Z79899 Other long term (current) drug therapy: Secondary | ICD-10-CM | POA: Insufficient documentation

## 2015-10-02 DIAGNOSIS — E785 Hyperlipidemia, unspecified: Secondary | ICD-10-CM | POA: Diagnosis not present

## 2015-10-02 DIAGNOSIS — Z87891 Personal history of nicotine dependence: Secondary | ICD-10-CM | POA: Diagnosis not present

## 2015-10-02 DIAGNOSIS — I340 Nonrheumatic mitral (valve) insufficiency: Secondary | ICD-10-CM | POA: Diagnosis not present

## 2015-10-02 DIAGNOSIS — I481 Persistent atrial fibrillation: Secondary | ICD-10-CM | POA: Insufficient documentation

## 2015-10-02 DIAGNOSIS — I4891 Unspecified atrial fibrillation: Secondary | ICD-10-CM | POA: Diagnosis present

## 2015-10-02 DIAGNOSIS — I4819 Other persistent atrial fibrillation: Secondary | ICD-10-CM | POA: Diagnosis present

## 2015-10-02 DIAGNOSIS — M1990 Unspecified osteoarthritis, unspecified site: Secondary | ICD-10-CM | POA: Insufficient documentation

## 2015-10-02 HISTORY — PX: CARDIOVERSION: SHX1299

## 2015-10-02 SURGERY — CARDIOVERSION
Anesthesia: Monitor Anesthesia Care

## 2015-10-02 MED ORDER — PHENYLEPHRINE HCL 10 MG/ML IJ SOLN
INTRAMUSCULAR | Status: DC | PRN
  Administered 2015-10-02 (×2): 80 ug via INTRAVENOUS

## 2015-10-02 MED ORDER — PROPOFOL 10 MG/ML IV BOLUS
INTRAVENOUS | Status: DC | PRN
  Administered 2015-10-02: 70 mg via INTRAVENOUS

## 2015-10-02 MED ORDER — LACTATED RINGERS IV SOLN
INTRAVENOUS | Status: DC | PRN
  Administered 2015-10-02: 12:00:00 via INTRAVENOUS

## 2015-10-02 MED ORDER — SODIUM CHLORIDE 0.9 % IV SOLN
INTRAVENOUS | Status: DC
  Administered 2015-10-02: 11:00:00 via INTRAVENOUS

## 2015-10-02 MED ORDER — LIDOCAINE HCL (CARDIAC) 20 MG/ML IV SOLN
INTRAVENOUS | Status: DC | PRN
  Administered 2015-10-02: 40 mg via INTRAVENOUS

## 2015-10-02 NOTE — Anesthesia Preprocedure Evaluation (Signed)
Anesthesia Evaluation  Patient identified by MRN, date of birth, ID band Patient awake    Reviewed: Allergy & Precautions, H&P , NPO status , Patient's Chart, lab work & pertinent test results  History of Anesthesia Complications Negative for: history of anesthetic complications  Airway Mallampati: II  TM Distance: >3 FB Neck ROM: full    Dental no notable dental hx.    Pulmonary former smoker,    Pulmonary exam normal breath sounds clear to auscultation       Cardiovascular hypertension, Pt. on medications Normal cardiovascular exam+ dysrhythmias Atrial Fibrillation  Rhythm:regular Rate:Normal     Neuro/Psych negative neurological ROS     GI/Hepatic negative GI ROS, Neg liver ROS,   Endo/Other  negative endocrine ROS  Renal/GU negative Renal ROS     Musculoskeletal  (+) Arthritis ,   Abdominal   Peds  Hematology negative hematology ROS (+)   Anesthesia Other Findings   Reproductive/Obstetrics negative OB ROS                             Anesthesia Physical Anesthesia Plan  ASA: III  Anesthesia Plan: MAC   Post-op Pain Management:    Induction: Intravenous  Airway Management Planned:   Additional Equipment: None  Intra-op Plan:   Post-operative Plan:   Informed Consent: I have reviewed the patients History and Physical, chart, labs and discussed the procedure including the risks, benefits and alternatives for the proposed anesthesia with the patient or authorized representative who has indicated his/her understanding and acceptance.   Dental Advisory Given  Plan Discussed with: Anesthesiologist, CRNA and Surgeon  Anesthesia Plan Comments: (Prop bolus)        Anesthesia Quick Evaluation

## 2015-10-02 NOTE — CV Procedure (Signed)
    Electrical Cardioversion Procedure Note KAMARA ALLAN [id number] 01/24/1941  Procedure: Electrical Cardioversion Indications:  Atrial Fibrillation  Time Out: Verified patient identification, verified procedure,medications/allergies/relevent history reviewed, required imaging and test results available.  Performed  Procedure Details  The patient was NPO after midnight. Anesthesia was administered at the beside  by Dr. Jillyn Hidden with propofol.  Cardioversion was performed with synchronized biphasic defibrillation via AP pads with 120 joules.  1 attempt(s) were performed.  The patient converted to normal sinus rhythm. The patient tolerated the procedure well   IMPRESSION:  Successful cardioversion of atrial fibrillation Post conversion bradycardia 48-50bpm Post conversion transient hypotension, responded to IV fluid bolus and awake state. Phenylephrine was given IV as well.  Alert.   Discussed with husband.  Will stop digoxin and atenolol. Continue amiodarone and Xarelto.     Rheta Hemmelgarn, [place] 10/02/2015, 12:12 PM

## 2015-10-02 NOTE — Anesthesia Postprocedure Evaluation (Signed)
  Anesthesia Post-op Note  Patient: Michele Green  Procedure(s) Performed: Procedure(s) (LRB): CARDIOVERSION (N/A)  Patient Location: PACU  Anesthesia Type: MAC  Level of Consciousness: awake and alert   Airway and Oxygen Therapy: Patient Spontanous Breathing  Post-op Pain: mild  Post-op Assessment: Post-op Vital signs reviewed, Patient's Cardiovascular Status Stable, Respiratory Function Stable, Patent Airway and No signs of Nausea or vomiting  Last Vitals:  Filed Vitals:   10/02/15 1240  BP: 102/56  Pulse: 62  Temp:   Resp: 33    Post-op Vital Signs: stable   Complications: patient did experience significant hypotension after cardioversion with systolic in 62B, cardiology at bedside throughout and with their input we gave minimal bolus's of phenylephrine, placed patient in trendelenburg, did increase preload with crystalloid bolus since pt had normal EF and upon wakening her BP improved to baseline values. On awakening she was completely asymptomatic even though her systolics remained in 76E for 5-10 min. She reports she is frequently hypotensive at home, they will be D/Cing her digoxin, cutting her BB dose in half on discharge

## 2015-10-02 NOTE — Interval H&P Note (Signed)
History and Physical Interval Note:  10/02/2015 11:29 AM  Michele Green  has presented today for surgery, with the diagnosis of A FIB   The various methods of treatment have been discussed with the patient and family. After consideration of risks, benefits and other options for treatment, the patient has consented to  Procedure(s): CARDIOVERSION (N/A) as a surgical intervention .  The patient's history has been reviewed, patient examined, no change in status, stable for surgery.  I have reviewed the patient's chart and labs.  Questions were answered to the patient's satisfaction.     SKAINS, MARK

## 2015-10-02 NOTE — Discharge Instructions (Signed)
Electrical Cardioversion, Care After °Refer to this sheet in the next few weeks. These instructions provide you with information on caring for yourself after your procedure. Your health care provider may also give you more specific instructions. Your treatment has been planned according to current medical practices, but problems sometimes occur. Call your health care provider if you have any problems or questions after your procedure. °WHAT TO EXPECT AFTER THE PROCEDURE °After your procedure, it is typical to have the following sensations: °· Some redness on the skin where the shocks were delivered. If this is tender, a sunburn lotion or hydrocortisone cream may help. °· Possible return of an abnormal heart rhythm within hours or days after the procedure. °HOME CARE INSTRUCTIONS °· Take medicines only as directed by your health care provider. Be sure you understand how and when to take your medicine. °· Learn how to feel your pulse and check it often. °· Limit your activity for 48 hours after the procedure or as directed by your health care provider. °· Avoid or minimize caffeine and other stimulants as directed by your health care provider. °SEEK MEDICAL CARE IF: °· You feel like your heart is beating too fast or your pulse is not regular. °· You have any questions about your medicines. °· You have bleeding that will not stop. °SEEK IMMEDIATE MEDICAL CARE IF: °· You are dizzy or feel faint. °· It is hard to breathe or you feel short of breath. °· There is a change in discomfort in your chest. °· Your speech is slurred or you have trouble moving an arm or leg on one side of your body. °· You get a serious muscle cramp that does not go away. °· Your fingers or toes turn cold or blue. °  °This information is not intended to replace advice given to you by your health care provider. Make sure you discuss any questions you have with your health care provider. °  °Document Released: 08/29/2013 Document Revised: 11/29/2014  Document Reviewed: 08/29/2013 °Elsevier Interactive Patient Education ©2016 [employer] ° °

## 2015-10-02 NOTE — Transfer of Care (Signed)
Immediate Anesthesia Transfer of Care Note  Patient: Michele Green  Procedure(s) Performed: Procedure(s): CARDIOVERSION (N/A)  Patient Location: Endoscopy Unit  Anesthesia Type:MAC  Level of Consciousness: awake, alert  and oriented  Airway & Oxygen Therapy: Patient Spontanous Breathing and Patient connected to nasal cannula oxygen  Post-op Assessment: Report given to RN, Post -op Vital signs reviewed and stable and Patient moving all extremities  Post vital signs: Reviewed and stable  Last Vitals:  Filed Vitals:   10/02/15 1050  BP: 113/71  Resp: 11    Complications: No apparent anesthesia complications

## 2015-10-02 NOTE — H&P (View-Only) (Signed)
Cardiology Office Note   Date:  09/10/2015   ID:  [name], DOB 1941-07-05, MRN [id number]  PCP:  [name], MD  Cardiologist:  Sinclair Grooms, MD   Chief Complaint  Patient presents with  . Atrial Fibrillation    set up cardioversion if still AF  . Medication Problem    on amiodarone      History of Present Illness: Michele Green is a 74 y.o. female who presents for hypertension, persistent atrial fibrillation, mild mitral regurgitation, and anticoagulation therapy. Also known to have hyperlipidemia.  Atrial fibrillation probably occurred in late August early September when she began feeling fatigued and complained of exertional dyspnea and fatigue. She was seen by Dr. Marisue Humble on September 20 where atrial fib was identified, treated with digoxin 0.125 mg daily and anticoagulation therapy with Xarelto was started. Since starting digoxin, she has felt progressively better. She denies chest discomfort. No episodes of syncope. Exertional dyspnea and fatigue have significantly improved but still causes some limitations. She is not back to her usual exertional baseline. She denies orthopnea. She has not had anginal quality chest pain.  Since starting low-dose digoxin and amiodarone, she feels better. Exertional tolerance has improved. She is still way below her usual level of energy and exertional tolerance. In addition she complains of bilateral lower extremity weakness and itching on the bottom of her feet. She had tripped his status to medications. She has had no bleeding on the current anticoagulation regimen. She is had no neurological complaints. She denies syncope.  Past Medical History  Diagnosis Date  . Osteopenia   . Hypertension   . Colon polyp 02/2012  . Colitis   . Dupuytren contracture     bilateral hands  . Wears glasses   . Arthritis   . Persistent atrial fibrillation ([facility]) 08/22/2015    Started late August or early September.   . On continuous  oral anticoagulation 08/22/2015    Started on Xarelto 08/12/2015     Past Surgical History  Procedure Laterality Date  . Dupuytren contracture release  2001    leftx2  . Tonsillectomy  age 70  . Dupuytren contracture release  1990    right  . Colonoscopy    . Dupuytren contracture release Right 05/02/2014    Procedure: EXCISION DUPUYTRENS RIGHT PALMAR/SMALL ;  Surgeon: Cammie Sickle, MD;  Location: [facility];  Service: Orthopedics;  Laterality: Right;     Current Outpatient Prescriptions  Medication Sig Dispense Refill  . allopurinol (ZYLOPRIM) 100 MG tablet Take 1 tablet by mouth 2 (two) times daily.    [name] amiodarone (PACERONE) 200 MG tablet Take 1 tablet (200 mg total) by mouth 2 (two) times daily. 180 tablet 1  . atenolol (TENORMIN) 50 MG tablet Take 1 tablet by mouth daily.    [name] CALCIUM PO Take 1 tablet by mouth daily.     . digoxin 62.5 MCG TABS Take 0.0625 mg by mouth daily. 90 tablet 1  . LIALDA 1.2 G EC tablet Take 1 tablet by mouth 4 (four) times daily.     . rivaroxaban (XARELTO) 20 MG TABS tablet Take 1 tablet (20 mg total) by mouth daily with supper. 90 tablet 1  . simvastatin (ZOCOR) 20 MG tablet Take 1 tablet by mouth daily.    . Vitamin D, Ergocalciferol, (DRISDOL) 50000 UNITS CAPS capsule Take 1 capsule by mouth  weekly 30 capsule 0   No current facility-administered medications for this visit.  Allergies:   Review of patient's allergies indicates no known allergies.    Social History:  The patient  reports that she quit smoking about 22 years ago. Her smoking use included Cigarettes. She has a 20 pack-year smoking history. She has never used smokeless tobacco. She reports that she drinks about 1.5 oz of alcohol per week. She reports that she does not use illicit drugs.   Family History:  The patient's family history includes Breast cancer (age of onset: 75) in her sister; Cirrhosis in her mother; Heart failure in her father; Kidney Stones in  her sister.    ROS:  Please see the history of present illness.   Otherwise, review of systems are positive for no blood in the urine or stool. Occasional irregular heartbeat. Otherwise no complaints other than as stated above..   All other systems are reviewed and negative.    PHYSICAL EXAM: VS:  BP 114/80 mmHg  Pulse 87  Ht 5' 3"  (1.6 m)  Wt 67.042 kg (147 lb 12.8 oz)  BMI 26.19 kg/m2 , BMI Body mass index is 26.19 kg/(m^2). GEN: Well nourished, well developed, in no acute distress HEENT: normal Neck: no JVD, carotid bruits, or masses Cardiac: IIRR.  There is a 2/6 systolic murmur, rub, or gallop. There is no edema. Respiratory:  clear to auscultation bilaterally, normal work of breathing. GI: soft, nontender, nondistended, + BS MS: no deformity or atrophy Skin: warm and dry, no rash Neuro:  Strength and sensation are intact Psych: euthymic mood, full affect   EKG:  EKG is ordered today. The ekg reveals atrial fib with rate control. There are nonspecific ST-T wave changes noted. Chest abnormality is consistent with dig effect.   Recent Labs: No results found for requested labs within last 365 days.    Lipid Panel No results found for: CHOL, TRIG, HDL, CHOLHDL, VLDL, LDLCALC, LDLDIRECT    Wt Readings from Last 3 Encounters:  09/10/15 67.042 kg (147 lb 12.8 oz)  08/22/15 67.132 kg (148 lb)  12/12/14 69.128 kg (152 lb 6.4 oz)      Other studies Reviewed: Additional studies/ records that were reviewed today include: None. The findings include none which are new..    ASSESSMENT AND PLAN:  1. Persistent atrial fibrillation (HCC) Persists but with rate control on low-dose dig, amiodarone, and atenolol.  2. Essential hypertension Controlled  3. On amiodarone therapy Possible side effects  4. On continuous oral anticoagulation No bleeding  5. Hyperlipidemia Not discussed  6. Mitral regurgitation Not discussed    Current medicines are reviewed at length  with the patient today.  The patient has the following concerns regarding medicines: Whether lower extremity weakness and itching is side effect of her medications..  The following changes/actions have been instituted:    Continue 400 mg of amiodarone per day for another week then decrease to 200 mg daily  Electrical cardioversion in mid November  Laboratory data and ECG prior to cardioversion  Follow-up with me in one to 2 weeks post conversion.  We had a long discussion concerning medication management post conversion. We discussed the likely long-term use of anticoagulation therapy. We will try to discontinue amiodarone.  Labs/ tests ordered today include:  No orders of the defined types were placed in this encounter.     Disposition:   FU with HS in 6 weeks  Signed, Sinclair Grooms, MD  09/10/2015 9:15 AM    [place], [place]  [address] Phone: [phone]; Fax: [phone]

## 2015-10-03 ENCOUNTER — Encounter (HOSPITAL_COMMUNITY): Payer: Self-pay | Admitting: Cardiology

## 2015-10-21 ENCOUNTER — Encounter: Payer: Self-pay | Admitting: Interventional Cardiology

## 2015-10-21 ENCOUNTER — Ambulatory Visit (INDEPENDENT_AMBULATORY_CARE_PROVIDER_SITE_OTHER): Payer: Medicare Other | Admitting: Interventional Cardiology

## 2015-10-21 VITALS — BP 114/66 | HR 70 | Ht 64.0 in | Wt 142.8 lb

## 2015-10-21 DIAGNOSIS — Z79899 Other long term (current) drug therapy: Secondary | ICD-10-CM

## 2015-10-21 DIAGNOSIS — I10 Essential (primary) hypertension: Secondary | ICD-10-CM

## 2015-10-21 DIAGNOSIS — I481 Persistent atrial fibrillation: Secondary | ICD-10-CM | POA: Diagnosis not present

## 2015-10-21 DIAGNOSIS — Z7901 Long term (current) use of anticoagulants: Secondary | ICD-10-CM

## 2015-10-21 DIAGNOSIS — I340 Nonrheumatic mitral (valve) insufficiency: Secondary | ICD-10-CM

## 2015-10-21 DIAGNOSIS — I4819 Other persistent atrial fibrillation: Secondary | ICD-10-CM

## 2015-10-21 MED ORDER — AMIODARONE HCL 200 MG PO TABS: 200.0000 mg | ORAL_TABLET | Freq: Every day | ORAL | Status: DC

## 2015-10-21 MED ORDER — RIVAROXABAN 20 MG PO TABS: 20.0000 mg | ORAL_TABLET | Freq: Every day | ORAL | Status: DC

## 2015-10-21 NOTE — Patient Instructions (Signed)
Medication Instructions:  Your physician recommends that you continue on your current medications as directed. Please refer to the Current Medication list given to you today.   Labwork: Your physician recommends that you return for lab work in: 5 months (Tsh, Hepatic)   Testing/Procedures: Echocardiogram as planned in Feb 2017  Follow-Up: Your physician wants you to follow-up in: 5 months with Dr.Smith You will receive a reminder letter in the mail two months in advance. If you don't receive a letter, please call our office to schedule the follow-up appointment.   Any Other Special Instructions Will Be Listed Below (If Applicable).     If you need a refill on your cardiac medications before your next appointment, please call your pharmacy.

## 2015-10-21 NOTE — Progress Notes (Signed)
Cardiology Office Note   Date:  10/21/2015   ID:  Michele Green, DOB [birth year], MRN [id number]  PCP:  [name], MD  Cardiologist:  Sinclair Grooms, MD   Chief Complaint  Patient presents with  . Atrial Fibrillation      History of Present Illness: Michele Green is a 74 y.o. female who presents for persistent atrial fibrillation, hypertension, chronic diastolic heart failure, and chronic anticoagulation therapy.  She is doing well and states symptoms being back and rhythm has been dramatic in terms of exertional tolerance. No medication side effects. No bleeding on anticoagulation.    Past Medical History  Diagnosis Date  . Osteopenia   . Hypertension   . Colon polyp 02/2012  . Colitis   . Dupuytren contracture     bilateral hands  . Wears glasses   . Arthritis   . Persistent atrial fibrillation ([facility]) 08/22/2015    Started late August or early September.   . On continuous oral anticoagulation 08/22/2015    Started on Xarelto 08/12/2015   . Dysrhythmia   . Shortness of breath dyspnea     Past Surgical History  Procedure Laterality Date  . Dupuytren contracture release  2001    leftx2  . Tonsillectomy  age 78  . Dupuytren contracture release  1990    right  . Colonoscopy    . Dupuytren contracture release Right 05/02/2014    Procedure: EXCISION DUPUYTRENS RIGHT PALMAR/SMALL ;  Surgeon: Cammie Sickle, MD;  Location: [facility];  Service: Orthopedics;  Laterality: Right;  . Cardioversion N/A 10/02/2015    Procedure: CARDIOVERSION;  Surgeon: Jerline Pain, MD;  Location: [facility] ENDOSCOPY;  Service: Cardiovascular;  Laterality: N/A;     Current Outpatient Prescriptions  Medication Sig Dispense Refill  . allopurinol (ZYLOPRIM) 100 MG tablet Take 100 mg by mouth 2 (two) times daily.     [name] amiodarone (PACERONE) 200 MG tablet Take 1 tablet (200 mg total) by mouth daily. 90 tablet 3  . CALCIUM PO Take 1 tablet by mouth daily.     .  ergocalciferol (VITAMIN D2) 50000 UNITS capsule Take 50,000 Units by mouth once a week.    [name] LIALDA 1.2 G EC tablet Take 1.2 g by mouth 4 (four) times daily.     . rivaroxaban (XARELTO) 20 MG TABS tablet Take 1 tablet (20 mg total) by mouth daily with supper. 90 tablet 3  . simvastatin (ZOCOR) 20 MG tablet Take 20 mg by mouth daily.      No current facility-administered medications for this visit.    Allergies:   Review of patient's allergies indicates no known allergies.    Social History:  The patient  reports that she quit smoking about 22 years ago. Her smoking use included Cigarettes. She has a 20 pack-year smoking history. She has never used smokeless tobacco. She reports that she drinks about 6.0 oz of alcohol per week. She reports that she does not use illicit drugs.   Family History:  The patient's family history includes Breast cancer (age of onset: 39) in her sister; Cirrhosis in her mother; Heart failure in her father; Kidney Stones in her sister.    ROS:  Please see the history of present illness.   Otherwise, review of systems are positive for none .   All other systems are reviewed and negative.    PHYSICAL EXAM: VS:  BP 114/66 mmHg  Pulse 70  Ht 5' 4"  (1.626 m)  Wt 142 lb 12.8 oz (64.774 kg)  BMI 24.50 kg/m2 , BMI Body mass index is 24.5 kg/(m^2). GEN: Well nourished, well developed, in no acute distress HEENT: normal Neck: no JVD, carotid bruits, or masses Cardiac: RRR.  There is late apical systolic murmur but no rub, or gallop. There is no edema. Respiratory:  clear to auscultation bilaterally, normal work of breathing. GI: soft, nontender, nondistended, + BS MS: no deformity or atrophy Skin: warm and dry, no rash Neuro:  Strength and sensation are intact Psych: euthymic mood, full affect   EKG:  EKG is ordered today. The ekg reveals sinus rhythm with nonspecific T-wave abnormality   Recent Labs: 09/29/2015: BUN 11; Creat 0.82; Potassium 4.1; Sodium 141     Lipid Panel No results found for: CHOL, TRIG, HDL, CHOLHDL, VLDL, LDLCALC, LDLDIRECT    Wt Readings from Last 3 Encounters:  10/21/15 142 lb 12.8 oz (64.774 kg)  10/02/15 147 lb (66.679 kg)  09/10/15 147 lb 12.8 oz (67.042 kg)      Other studies Reviewed: Additional studies/ records that were reviewed today include: Reviewed cardioversion report. The findings include no difficulty post-cardioversion.    ASSESSMENT AND PLAN:  1. Persistent atrial fibrillation (HCC) Now back in sinus rhythm and overall exertional tolerance has returned to normal.  2. Essential hypertension Well controlled  3. On amiodarone therapy No side effects  4. On continuous oral anticoagulation No bleeding  5. Mitral regurgitation Clinically mild to auscultation    Current medicines are reviewed at length with the patient today.  The patient has the following concerns regarding medicines: Wants three-month prescription refills.  The following changes/actions have been instituted:    5 month follow-up  Continue amiodarone and Xarelto  TSH and hepatic panel in 5 months  Labs/ tests ordered today include:   Orders Placed This Encounter  Procedures  . EKG 12-Lead     Disposition:   FU with HS in 5 months  Signed, Sinclair Grooms, MD  10/21/2015 10:09 AM    [facility] [place], [place], [place]  [address] Phone: [phone]; Fax: [phone]

## 2015-11-23 DIAGNOSIS — Z923 Personal history of irradiation: Secondary | ICD-10-CM

## 2015-11-23 HISTORY — DX: Personal history of irradiation: Z92.3

## 2015-12-24 ENCOUNTER — Other Ambulatory Visit: Payer: Self-pay

## 2015-12-24 DIAGNOSIS — Z1231 Encounter for screening mammogram for malignant neoplasm of breast: Secondary | ICD-10-CM

## 2016-01-13 ENCOUNTER — Ambulatory Visit
Admission: RE | Admit: 2016-01-13 | Discharge: 2016-01-13 | Disposition: A | Discharge status: Home / Self Care | Payer: Medicare Other | Source: Ambulatory Visit

## 2016-01-13 DIAGNOSIS — Z1231 Encounter for screening mammogram for malignant neoplasm of breast: Secondary | ICD-10-CM

## 2016-01-16 ENCOUNTER — Other Ambulatory Visit: Payer: Self-pay | Admitting: Family Medicine

## 2016-01-16 DIAGNOSIS — R928 Other abnormal and inconclusive findings on diagnostic imaging of breast: Secondary | ICD-10-CM

## 2016-01-19 ENCOUNTER — Ambulatory Visit (HOSPITAL_COMMUNITY): Payer: Medicare Other | Attending: Internal Medicine

## 2016-01-19 ENCOUNTER — Other Ambulatory Visit: Payer: Self-pay

## 2016-01-19 ENCOUNTER — Other Ambulatory Visit (HOSPITAL_COMMUNITY): Payer: Medicare Other

## 2016-01-19 DIAGNOSIS — I340 Nonrheumatic mitral (valve) insufficiency: Secondary | ICD-10-CM | POA: Insufficient documentation

## 2016-01-19 DIAGNOSIS — Z8249 Family history of ischemic heart disease and other diseases of the circulatory system: Secondary | ICD-10-CM | POA: Diagnosis not present

## 2016-01-19 DIAGNOSIS — I110 Hypertensive heart disease with heart failure: Secondary | ICD-10-CM | POA: Diagnosis not present

## 2016-01-19 DIAGNOSIS — I059 Rheumatic mitral valve disease, unspecified: Secondary | ICD-10-CM | POA: Diagnosis present

## 2016-01-19 DIAGNOSIS — Z87891 Personal history of nicotine dependence: Secondary | ICD-10-CM | POA: Insufficient documentation

## 2016-01-19 DIAGNOSIS — I509 Heart failure, unspecified: Secondary | ICD-10-CM | POA: Diagnosis not present

## 2016-01-23 ENCOUNTER — Ambulatory Visit
Admission: RE | Admit: 2016-01-23 | Discharge: 2016-01-23 | Disposition: A | Discharge status: Home / Self Care | Payer: Medicare Other | Source: Ambulatory Visit | Attending: Family Medicine | Admitting: Family Medicine

## 2016-01-23 ENCOUNTER — Other Ambulatory Visit: Payer: Self-pay | Admitting: Family Medicine

## 2016-01-23 DIAGNOSIS — R928 Other abnormal and inconclusive findings on diagnostic imaging of breast: Secondary | ICD-10-CM

## 2016-01-28 ENCOUNTER — Ambulatory Visit
Admission: RE | Admit: 2016-01-28 | Discharge: 2016-01-28 | Disposition: A | Discharge status: Home / Self Care | Payer: Medicare Other | Source: Ambulatory Visit | Attending: Family Medicine | Admitting: Family Medicine

## 2016-01-28 ENCOUNTER — Other Ambulatory Visit: Payer: Self-pay | Admitting: Family Medicine

## 2016-01-28 DIAGNOSIS — R928 Other abnormal and inconclusive findings on diagnostic imaging of breast: Secondary | ICD-10-CM

## 2016-01-28 HISTORY — PX: BREAST BIOPSY: SHX20

## 2016-02-02 ENCOUNTER — Encounter: Payer: Self-pay | Admitting: *Deleted

## 2016-02-02 ENCOUNTER — Telehealth: Payer: Self-pay | Admitting: *Deleted

## 2016-02-02 DIAGNOSIS — C50412 Malignant neoplasm of upper-outer quadrant of left female breast: Secondary | ICD-10-CM

## 2016-02-02 HISTORY — DX: Malignant neoplasm of upper-outer quadrant of left female breast: C50.412

## 2016-02-02 NOTE — Telephone Encounter (Signed)
Confirmed [facility] for 02/04/16 at 1215 .  Instructions and contact information given.

## 2016-02-03 NOTE — Progress Notes (Signed)
[place]  Telephone:([phone]) [phone] Fax:([phone]) [place] Note   Patient Care Team: [name], MD as PCP - General (Family Medicine) 02/04/2016  CHIEF COMPLAINTS/PURPOSE OF CONSULTATION:  Newly diagnosed left breast cancer  Oncology History   Breast cancer of upper-outer quadrant of left female breast [facility])   Staging form: Breast, AJCC 7th Edition     Clinical stage from 01/28/2016: Stage IA (T1a, N0, M0) - Signed by Truitt Merle, MD on 02/03/2016        Breast cancer of upper-outer quadrant of left female breast ([place])   01/14/2016 Mammogram Screening mammogram showed possible mass with distortion in the left breast.   01/23/2016 Imaging Diagnostic mammogram and ultrasound of the left breast showed a 3-4 mm shadowing mass at 1:00 in the left breast, no adenopathy.   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   01/28/2016 Receptors her2 Your 100% positive, PR 100% positive, HER-2 negative, Ki-67 5%     HISTORY OF PRESENTING ILLNESS:  Michele Green 75 y.o. female is here because of newly diagnosed left breast cancer. She is accompanied by her husband to our multidisciplinary rest clinic today.  This was discovered by screening mammo, she denies any palpable breast mass, skin change or nipple discharge, she feels well, no complains. She remains to be physically active.   She  was found to have AF and had cardioverstion in 09/2015,  she has been on amiodarone and Xarelto since then. She also has had colitis 2-3 yeears, on liada,  she follows up with gastroenterologist Dr. Amedeo Plenty.  GYN HISTORY  Menarchal: 12 LMP: 48 Contraceptive: 8 HRT: 5 years, stopped when she was 85 G1P1: no breast feeding        MEDICAL HISTORY:  Past Medical History  Diagnosis Date  . Osteopenia   . Hypertension   . Colon polyp  02/2012  . Colitis   . Dupuytren contracture     bilateral hands  . Wears glasses   . Arthritis   . Persistent atrial fibrillation ([place]) 08/22/2015    Started late August or early September.   . On continuous oral anticoagulation 08/22/2015    Started on Xarelto 08/12/2015   . Dysrhythmia   . Shortness of breath dyspnea   . Breast cancer of upper-outer quadrant of left female breast ([place]) 02/02/2016  . Breast cancer [facility])     SURGICAL HISTORY: Past Surgical History  Procedure Laterality Date  . Dupuytren contracture release  2001    leftx2  . Tonsillectomy  age 85  . Dupuytren contracture release  1990    right  . Colonoscopy    . Dupuytren contracture release Right 05/02/2014    Procedure: EXCISION DUPUYTRENS RIGHT PALMAR/SMALL ;  Surgeon: Cammie Sickle, MD;  Location: [place];  Service: Orthopedics;  Laterality: Right;  . Cardioversion N/A 10/02/2015    Procedure: CARDIOVERSION;  Surgeon: Jerline Pain, MD;  Location: [facility] ENDOSCOPY;  Service: Cardiovascular;  Laterality: N/A;    SOCIAL HISTORY: Social History   Social History  . Marital Status: Married    Spouse Name: N/A  . Number of Children: 1  . Years of Education: N/A   Occupational History  . Not on file.   Social History Main Topics  . Smoking status: Former Smoker -- 1.00 packs/day for 20 years  Types: Cigarettes    Quit date: 11/22/1992  . Smokeless tobacco: Never Used  . Alcohol Use: 6.0 oz/week    7 Glasses of wine, 3 Standard drinks or equivalent per week  . Drug Use: No  . Sexual Activity: No   Other Topics Concern  . Not on file   Social History Narrative    FAMILY HISTORY: Family History  Problem Relation Age of Onset  . Cirrhosis Mother   . Heart failure Father   . Breast cancer Sister 46  . Kidney Stones Sister     loss of kidney due to stones  . Breast cancer Mother 27    ALLERGIES:  has No Known Allergies.  MEDICATIONS:  Current Outpatient Prescriptions    Medication Sig Dispense Refill  . allopurinol (ZYLOPRIM) 100 MG tablet Take 100 mg by mouth daily.     [name] amiodarone (PACERONE) 200 MG tablet Take 1 tablet (200 mg total) by mouth daily. 90 tablet 3  . LIALDA 1.2 G EC tablet Take 1.2 g by mouth 3 (three) times daily.     . rivaroxaban (XARELTO) 20 MG TABS tablet Take 1 tablet (20 mg total) by mouth daily with supper. 90 tablet 3  . simvastatin (ZOCOR) 20 MG tablet Take 20 mg by mouth daily.     [name] CALCIUM PO Take 1 tablet by mouth daily. Reported on 02/04/2016    . ergocalciferol (VITAMIN D2) 50000 UNITS capsule Take 50,000 Units by mouth once a week. Reported on 02/04/2016     No current facility-administered medications for this visit.    REVIEW OF SYSTEMS:   Constitutional: Denies fevers, chills or abnormal night sweats Eyes: Denies blurriness of vision, double vision or watery eyes Ears, nose, mouth, throat, and face: Denies mucositis or sore throat Respiratory: Denies cough, dyspnea or wheezes Cardiovascular: Denies palpitation, chest discomfort or lower extremity swelling Gastrointestinal:  Denies nausea, heartburn or change in bowel habits Skin: Denies abnormal skin rashes Lymphatics: Denies new lymphadenopathy or easy bruising Neurological:Denies numbness, tingling or new weaknesses Behavioral/Psych: Mood is stable, no new changes  All other systems were reviewed with the patient and are negative.  PHYSICAL EXAMINATION: ECOG PERFORMANCE STATUS: 0 - Asymptomatic  Filed Vitals:   02/04/16 1237  BP: 139/72  Pulse: 76  Temp: 97.5 F (36.4 C)  Resp: 17   Filed Weights   02/04/16 1237  Weight: 143 lb 9.6 oz (65.137 kg)    GENERAL:alert, no distress and comfortable SKIN: skin color, texture, turgor are normal, no rashes or significant lesions EYES: normal, conjunctiva are pink and non-injected, sclera clear OROPHARYNX:no exudate, no erythema and lips, buccal mucosa, and tongue normal  NECK: supple, thyroid normal size,  non-tender, without nodularity LYMPH:  no palpable lymphadenopathy in the cervical, axillary or inguinal LUNGS: clear to auscultation and percussion with normal breathing effort HEART: regular rate & rhythm and no murmurs and no lower extremity edema ABDOMEN:abdomen soft, non-tender and normal bowel sounds Musculoskeletal:no cyanosis of digits and no clubbing  PSYCH: alert & oriented x 3 with fluent speech NEURO: no focal motor/sensory deficits Breasts: Breast inspection showed them to be symmetrical with no nipple discharge. Palpation of the breasts and axilla revealed no obvious mass that I could appreciate.   LABORATORY DATA:  I have reviewed the data as listed Lab Results  Component Value Date   WBC 4.7 02/04/2016   HGB 14.0 02/04/2016   HCT 41.5 02/04/2016   MCV 108.3* 02/04/2016   PLT 143 Occ Large & giant  platelets* 02/04/2016    Recent Labs  09/29/15 1100 02/04/16 1221  NA 141 143  K 4.1 3.9  CL 106  --   CO2 23 27  GLUCOSE 92 98  BUN 11 14.0  CREATININE 0.82 0.8  CALCIUM 8.9 9.4  PROT  --  7.3  ALBUMIN  --  3.7  AST  --  44*  ALT  --  37  ALKPHOS  --  85  BILITOT  --  0.86   PATHOLOGY REPORT  Diagnosis 01/28/2016 Breast, left, needle core biopsy, 1:00 o'clock, 4 CMFN - INVASIVE DUCTAL CARCINOMA. - SEE COMMENT. Microscopic Comment The carcinoma appears grade 1. A breast prognostic profile will be performed and the results reported separately. The results were called to [facility] on 01/29/2016. (JBK:ecj 01/29/2016) Results: IMMUNOHISTOCHEMICAL AND MORPHOMETRIC ANALYSIS PERFORMED MANUALLY Estrogen Receptor: 100%, POSITIVE, STRONG STAINING INTENSITY Progesterone Receptor: 100%, POSITIVE, STRONG STAINING INTENSITY Proliferation Marker Ki67: 5% Results: HER2 - NEGATIVE RATIO OF HER2/CEP17 SIGNALS 1.35 AVERAGE HER2 COPY NUMBER PER CELL 1.75   RADIOGRAPHIC STUDIES: I have personally reviewed the radiological images as listed and agreed  with the findings in the report.   Mm Diag Breast Tomo Uni Left and [place] 01/23/2016  01/23/2016  CLINICAL DATA:  75 year old female presenting for screening recall of left breast distortion. EXAM: DIGITAL DIAGNOSTIC LEFT MAMMOGRAM WITH 3D TOMOSYNTHESIS AND CAD LEFT BREAST ULTRASOUND COMPARISON:  Previous exam(s). ACR Breast Density Category c: The breast tissue is heterogeneously dense, which may obscure small masses. FINDINGS: There is a persistent distortion in the upper-outer quadrant of the left breast in the posterior depth with a possible tiny associated mass. Mammographic images were processed with CAD. Physical exam of the upper-outer quadrant of the left breast demonstrates a small firm palpable lump at 1 o'clock, 4 cm from the nipple. Ultrasound targeted to the left breast at 1 o'clock, 4 cm from the nipple at the site of the palpable lump, there is a tiny 3-4 mm shadowing mass with possible internal blood flow on color Doppler imaging. Ultrasound of the left axilla demonstrates multiple normal-appearing lymph nodes. IMPRESSION: 1. A 3-4 mm shadowing mass is identified at 1 o'clock in the left breast, likely corresponding with the area of distortion on the mammogram. 2.  No evidence of left axillary lymphadenopathy. RECOMMENDATION: Ultrasound-guided biopsy is recommended for the shadowing mass in the left breast at 1 o'clock. This has been scheduled for 01/28/2016 at 2 p.m. I have discussed the findings and recommendations with the patient. Results were also provided in writing at the conclusion of the visit. If applicable, a reminder letter will be sent to the patient regarding the next appointment. BI-RADS CATEGORY  4: Suspicious. Electronically Signed   By: Ammie Ferrier M.D.   On: 01/23/2016 11:40   Mm Screening Breast Tomo Bilateral  01/14/2016  CLINICAL DATA:  Screening. EXAM: DIGITAL SCREENING BILATERAL MAMMOGRAM WITH 3D TOMO WITH CAD COMPARISON:  Previous exam(s). ACR Breast Density Category  c: The breast tissue is heterogeneously dense, which may obscure small masses. FINDINGS: In the left breast, possible mass with distortion warrants further evaluation. In the right breast, no findings suspicious for malignancy. Images were processed with CAD. IMPRESSION: Further evaluation is suggested for possible mass with distortion in the left breast. RECOMMENDATION: Diagnostic mammogram and possibly ultrasound of the left breast. (Code:[id number]) The patient will be contacted regarding the findings, and additional imaging will be scheduled. BI-RADS CATEGORY  0: Incomplete. Need additional imaging evaluation and/or prior mammograms  for comparison. Electronically Signed   By: Lajean Manes M.D.   On: 01/14/2016 11:36     ASSESSMENT & PLAN:  75 year-old postmenopausal Caucasian woman, presented with screening this covered left breast cancer.  1. Breast cancer of upper-outer quadrant of left female breast, G1 invasive ductal carcinoma, cT1aN0M0, stage IA, ER+/PR+/HER2- -I reviewed her mammograms findings, and left breast mass biopsy result was patient and her husband in great details. -We discussed that she has very early-stage breast cancer, and likely will be cured by complete surgical resection alone. She was seen by Dr. [name] today who recommended lumpectomy with or without sentinel lymph node biopsy.  -We discussed the risk of cancer recurrence after complete surgical resection. Giving the very small size of the tumor, low-grade, low Ki-67, ER PR positive HER-2 negative disease, the risk of recurrence is quite low. I do not recommend adjuvant chemotherapy. -Given the strong ER and PR positivity, I recommend pt to consider adjuvant aromatase inhibitor to reduce her risk of cancer recurrence. She does have atrial fibrillation, and mitral regurgitation, I discussed the slightly increased risk of cardiovascular disease from aromatase inhibitor, in addition to the common side effects of hot flash,  musculoskeletal discomfort, osteopenia and osteoporosis, etc. Patient will think about it and discuss with her cardiologist. -She was also seen by a radiation oncologist Dr. Sondra Come today, who does not recommends adjuvant breast radiation due to her small risk of recurrence and advance age.   2. HTN, AF, mitral regurgitation  -She'll continue follow-up with her primary care physician and cardiologist.  3. Genetics -Given her positive family history of breast cancer in her mother and a sister, we will refer her to genetic counseling to ruled out inheritable breast cancer syndrome. She is interested, we'll set up her appointment.  Plan -She'll proceed with breast surgery with lumpectomy and sentinel lymph node biopsy -Genetic referral -I'll plan to see her back 3 weeks after her surgery to review her surgical path and finalize her adjuvant endocrine therapy   All questions were answered. The patient knows to call the clinic with any problems, questions or concerns. I spent 55 minutes counseling the patient face to face. The total time spent in the appointment was 60 minutes and more than 50% was on counseling.     Truitt Merle, MD 02/04/2016 2:50 PM

## 2016-02-04 ENCOUNTER — Ambulatory Visit
Admission: RE | Admit: 2016-02-04 | Discharge: 2016-02-04 | Disposition: A | Discharge status: Home / Self Care | Payer: Medicare Other | Source: Ambulatory Visit | Attending: Radiation Oncology | Admitting: Radiation Oncology

## 2016-02-04 ENCOUNTER — Other Ambulatory Visit (HOSPITAL_BASED_OUTPATIENT_CLINIC_OR_DEPARTMENT_OTHER): Payer: Medicare Other

## 2016-02-04 ENCOUNTER — Encounter: Payer: Self-pay | Admitting: Genetic Counselor

## 2016-02-04 ENCOUNTER — Ambulatory Visit (HOSPITAL_BASED_OUTPATIENT_CLINIC_OR_DEPARTMENT_OTHER): Payer: Medicare Other | Admitting: Hematology

## 2016-02-04 ENCOUNTER — Encounter: Payer: Self-pay | Admitting: Physical Therapy

## 2016-02-04 ENCOUNTER — Ambulatory Visit: Payer: Medicare Other | Attending: General Surgery | Admitting: Physical Therapy

## 2016-02-04 ENCOUNTER — Other Ambulatory Visit: Payer: Self-pay | Admitting: General Surgery

## 2016-02-04 VITALS — BP 139/72 | HR 76 | Temp 97.5°F | Resp 17 | Ht 64.0 in | Wt 143.6 lb

## 2016-02-04 DIAGNOSIS — C50412 Malignant neoplasm of upper-outer quadrant of left female breast: Secondary | ICD-10-CM

## 2016-02-04 DIAGNOSIS — Z803 Family history of malignant neoplasm of breast: Secondary | ICD-10-CM

## 2016-02-04 DIAGNOSIS — Z87891 Personal history of nicotine dependence: Secondary | ICD-10-CM

## 2016-02-04 DIAGNOSIS — R293 Abnormal posture: Secondary | ICD-10-CM | POA: Diagnosis present

## 2016-02-04 DIAGNOSIS — I10 Essential (primary) hypertension: Secondary | ICD-10-CM | POA: Diagnosis not present

## 2016-02-04 DIAGNOSIS — I340 Nonrheumatic mitral (valve) insufficiency: Secondary | ICD-10-CM | POA: Diagnosis not present

## 2016-02-04 DIAGNOSIS — I4891 Unspecified atrial fibrillation: Secondary | ICD-10-CM | POA: Diagnosis not present

## 2016-02-04 LAB — COMPREHENSIVE METABOLIC PANEL
ALK PHOS: 85 U/L (ref 40–150)
ALT: 37 U/L (ref 0–55)
AST: 44 U/L — AB (ref 5–34)
Albumin: 3.7 g/dL (ref 3.5–5.0)
Anion Gap: 8 mEq/L (ref 3–11)
BILIRUBIN TOTAL: 0.86 mg/dL (ref 0.20–1.20)
BUN: 14 mg/dL (ref 7.0–26.0)
CO2: 27 meq/L (ref 22–29)
Calcium: 9.4 mg/dL (ref 8.4–10.4)
Chloride: 107 mEq/L (ref 98–109)
Creatinine: 0.8 mg/dL (ref 0.6–1.1)
EGFR: 68 mL/min/{1.73_m2} — AB (ref >90)
GLUCOSE: 98 mg/dL (ref 70–140)
Potassium: 3.9 mEq/L (ref 3.5–5.1)
Sodium: 143 mEq/L (ref 136–145)
TOTAL PROTEIN: 7.3 g/dL (ref 6.4–8.3)

## 2016-02-04 LAB — CBC WITH DIFFERENTIAL/PLATELET
BASO%: 0.8 % (ref 0.0–2.0)
Basophils Absolute: 0 10*3/uL (ref 0.0–0.1)
EOS%: 0.6 % (ref 0.0–7.0)
Eosinophils Absolute: 0 10*3/uL (ref 0.0–0.5)
HCT: 41.5 % (ref 34.8–46.6)
HGB: 14 g/dL (ref 11.6–15.9)
LYMPH#: 0.7 10*3/uL — AB (ref 0.9–3.3)
LYMPH%: 14.9 % (ref 14.0–49.7)
MCH: 36.4 pg — ABNORMAL HIGH (ref 25.1–34.0)
MCHC: 33.6 g/dL (ref 31.5–36.0)
MCV: 108.3 fL — ABNORMAL HIGH (ref 79.5–101.0)
MONO#: 0.4 10*3/uL (ref 0.1–0.9)
MONO%: 8.2 % (ref 0.0–14.0)
NEUT%: 75.5 % (ref 38.4–76.8)
NEUTROS ABS: 3.5 10*3/uL (ref 1.5–6.5)
RBC: 3.83 10*6/uL (ref 3.70–5.45)
RDW: 12.5 % (ref 11.2–14.5)
WBC: 4.7 10*3/uL (ref 3.9–10.3)

## 2016-02-04 NOTE — Therapy (Signed)
[place], [place], [address] Phone: [phone]   Fax:  [phone]  Physical Therapy Evaluation  Patient Details  Name: Michele Green MRN: [id number] Date of Birth: [birth year] Referring Provider: Dr. [name]  Encounter Date: 02/04/2016      PT End of Session - 02/04/16 2111    Visit Number 1   Number of Visits 1   PT Start Time 3646   PT Stop Time 1405   PT Time Calculation (min) 22 min   Activity Tolerance Patient tolerated treatment well   Behavior During Therapy [facility] for tasks assessed/performed      Past Medical History  Diagnosis Date  . Osteopenia   . Hypertension   . Colon polyp 02/2012  . Colitis   . Dupuytren contracture     bilateral hands  . Wears glasses   . Arthritis   . Persistent atrial fibrillation ([place]) 08/22/2015    Started late August or early September.   . On continuous oral anticoagulation 08/22/2015    Started on Xarelto 08/12/2015   . Dysrhythmia   . Shortness of breath dyspnea   . Breast cancer of upper-outer quadrant of left female breast ([place]) 02/02/2016  . Breast cancer [facility])     Past Surgical History  Procedure Laterality Date  . Dupuytren contracture release  2001    leftx2  . Tonsillectomy  age 45  . Dupuytren contracture release  1990    right  . Colonoscopy    . Dupuytren contracture release Right 05/02/2014    Procedure: EXCISION DUPUYTRENS RIGHT PALMAR/SMALL ;  Surgeon: Cammie Sickle, MD;  Location: [place];  Service: Orthopedics;  Laterality: Right;  . Cardioversion N/A 10/02/2015    Procedure: CARDIOVERSION;  Surgeon: Jerline Pain, MD;  Location: [facility] ENDOSCOPY;  Service: Cardiovascular;  Laterality: N/A;    There were no vitals filed for this visit.  Visit Diagnosis:  Carcinoma of upper-outer quadrant of left female breast [facility]) - Plan: PT plan of care cert/re-cert  Abnormal posture - Plan: PT plan of care  cert/re-cert      Subjective Assessment - 02/04/16 2101    Subjective Patient reports she is here today for an assessment from her nedical team due to her new diagnosis of left breast cancer.   Patient is accompained by: Family member   Pertinent History Patient was diagnosed on 01/28/16 with left grade 1 invasive ductal carcinoma measuring 3-4 mm in size in the upper outer quadrant of her left breast.  It is ER/PR positive, HER2 negative and has a Ki67 of 5%.   Patient Stated Goals Reduce lymphedema risk and learn post op shoulder ROM HEP   Currently in Pain? No/denies            [facility] PT Assessment - 02/04/16 0001    Assessment   Medical Diagnosis Left breast cancer   Referring Provider Dr. [name]   Onset Date/Surgical Date 01/28/16   Hand Dominance Right   Prior Therapy none   Precautions   Precautions Other (comment)   Precaution Comments Active breast cancer   Restrictions   Weight Bearing Restrictions No   Balance Screen   Has the patient fallen in the past 6 months No   Has the patient had a decrease in activity level because of a fear of falling?  No   Is the patient reluctant to leave their home because of a fear of falling?  No  Home Environment   Living Environment Private residence   Living Arrangements Spouse/significant other   Available Help at Discharge Family   Prior Function   Level of Independence Independent   Vocation Retired   Biomedical scientist N/A   Leisure She does not exercise   Cognition   Overall Cognitive Status Within Functional Limits for tasks assessed   Posture/Postural Control   Posture/Postural Control Postural limitations   Postural Limitations Rounded Shoulders;Forward head   ROM / Strength   AROM / PROM / Strength AROM;Strength   AROM   AROM Assessment Site Shoulder   Right/Left Shoulder Left;Right   Right Shoulder Extension 52 Degrees   Right Shoulder Flexion 143 Degrees   Right Shoulder ABduction 155 Degrees    Right Shoulder Internal Rotation 81 Degrees   Right Shoulder External Rotation 81 Degrees   Left Shoulder Extension 49 Degrees   Left Shoulder Flexion 152 Degrees   Left Shoulder ABduction 158 Degrees   Left Shoulder Internal Rotation 81 Degrees   Left Shoulder External Rotation 88 Degrees   Strength   Overall Strength Within functional limits for tasks performed           LYMPHEDEMA/ONCOLOGY QUESTIONNAIRE - 02/04/16 2109    Type   Cancer Type Left breast cancer   Lymphedema Assessments   Lymphedema Assessments Upper extremities   Right Upper Extremity Lymphedema   10 cm Proximal to Olecranon Process 22 cm   Olecranon Process 21.7 cm   10 cm Proximal to Ulnar Styloid Process 17.9 cm   Just Proximal to Ulnar Styloid Process 14.3 cm   Across Hand at [employer] 16.4 cm   At [place] of 2nd Digit 5.8 cm   Left Upper Extremity Lymphedema   10 cm Proximal to Olecranon Process 22.8 cm   Olecranon Process 21.2 cm   10 cm Proximal to Ulnar Styloid Process 17.8 cm   Just Proximal to Ulnar Styloid Process 13.8 cm   Across Hand at [employer] 15.8 cm   At [place] of 2nd Digit 5.8 cm      Patient was instructed today in a home exercise program today for post op shoulder range of motion. These included active assist shoulder flexion in sitting, scapular retraction, wall walking with shoulder abduction, and hands behind head external rotation.  She was encouraged to do these twice a day, holding 3 seconds and repeating 5 times when permitted by her physician.         PT Education - 02/04/16 2110    Education provided Yes   Education Details Lymphedema risk reduction and post op shoulder ROM HEP   Person(s) Educated Patient;Spouse   Methods Explanation;Demonstration;Handout   Comprehension Verbalized understanding;Returned demonstration              Breast Clinic Goals - 02/04/16 2118    Patient will be able to verbalize understanding of pertinent lymphedema risk reduction  practices relevant to her diagnosis specifically related to skin care.   Time 1   Period Days   Status Achieved   Patient will be able to return demonstrate and/or verbalize understanding of the post-op home exercise program related to regaining shoulder range of motion.   Time 1   Period Days   Status Achieved   Patient will be able to verbalize understanding of the importance of attending the postoperative After Breast Cancer Class for further lymphedema risk reduction education and therapeutic exercise.   Time 1   Period Days   Status Achieved  Plan - 02/09/16 Jan 21, 2111    Clinical Impression Statement Patient was diagnosed on 01/28/16 with left grade 1 invasive ductal carcinoma measuring 3-4 mm in size in the upper outer quadrant of her left breast.  It is ER/PR positive, HER2 negative and has a Ki67 of 5%.  She is planning to have a left lumpectomy and sentinel node biopsy followed by possible radiation and anti-estrogen therapy.  She may benefit from post op PT to regain shoulder ROM and reduce lymphedema risk.   Pt will benefit from skilled therapeutic intervention in order to improve on the following deficits Decreased strength;Decreased knowledge of precautions;Pain;Impaired UE functional use;Decreased range of motion   Rehab Potential Excellent   Clinical Impairments Affecting Rehab Potential none   PT Frequency One time visit   PT Treatment/Interventions Therapeutic exercise;Patient/family education   PT Next Visit Plan f/u after surgery   PT Home Exercise Plan Post op shoulder ROM HEP   Consulted and Agree with Plan of Care Patient;Family member/caregiver   Family Member Consulted Husband          G-Codes - 2016/02/09 01/21/2117    Functional Assessment Tool Used Clinical Judgement   Functional Limitation Other PT primary   Other PT Primary Current Status [phone]) At least 1 percent but less than 20 percent impaired, limited or restricted   Other PT Primary Goal  Status (A2505) At least 1 percent but less than 20 percent impaired, limited or restricted   Other PT Primary Discharge Status (L9767) At least 1 percent but less than 20 percent impaired, limited or restricted     Patient will follow up at outpatient cancer rehab if needed following surgery.  If the patient requires physical therapy at that time, a specific plan will be dictated and sent to the referring physician for approval. The patient was educated today on appropriate basic range of motion exercises to begin post operatively and the importance of attending the After Breast Cancer class following surgery.  Patient was educated today on lymphedema risk reduction practices as it pertains to recommendations that will benefit the patient immediately following surgery.  She verbalized good understanding.  No additional physical therapy is indicated at this time.     Problem List Patient Active Problem List   Diagnosis Date Noted  . Breast cancer of upper-outer quadrant of left female breast ([place]) 02/02/2016  . On amiodarone therapy 09/09/2015  . On continuous oral anticoagulation 08/22/2015  . Persistent atrial fibrillation ([place]) 08/22/2015  . Mitral regurgitation 11/07/2013  . Essential hypertension 11/07/2013  . Hyperlipidemia 11/07/2013   Annia Friendly, PT 09-Feb-2016 9:22 PM  [place], [place], [address] Phone: [phone]   Fax:  [phone]  Name: Michele Green MRN: [id number] Date of Birth: July 26, 1941

## 2016-02-04 NOTE — Patient Instructions (Signed)

## 2016-02-04 NOTE — Progress Notes (Signed)
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  HISTORY OF PRESENT ILLNESS::Michele Green is a 74 y.o. female who presents to the clinic because of an abnormal mammogram 01/14/2016. She had a diagnostic mammogram and ultrasound of the left breast 01/23/2016, revealing a 3 to 4 mm  mass at the 1 o'clock position in the left breast. She had a biopsy 01/28/2016, revealing invasive ductal carcinoma, grade I. This showed the tumor to be ER (100%), PR (100%), Her2-neu negative, and Ki 67 5%. She is here today to discuss radiation therapy options with Dr. Sondra Come.  PREVIOUS RADIATION THERAPY: No  PAST MEDICAL HISTORY:  has a past medical history of Osteopenia; Hypertension; Colon polyp (02/2012); Colitis; Dupuytren contracture; Wears glasses; Arthritis; Persistent atrial fibrillation ([place]) (08/22/2015); On continuous oral anticoagulation (08/22/2015); Dysrhythmia; Shortness of breath dyspnea; and Breast cancer of upper-outer quadrant of left female breast ([place]) (02/02/2016).    PAST SURGICAL HISTORY: Past Surgical History  Procedure Laterality Date  . Dupuytren contracture release  2001    leftx2  . Tonsillectomy  age 43  . Dupuytren contracture release  1990    right  . Colonoscopy    . Dupuytren contracture release Right 05/02/2014    Procedure: EXCISION DUPUYTRENS RIGHT PALMAR/SMALL ;  Surgeon: Cammie Sickle, MD;  Location: [facility];  Service: Orthopedics;  Laterality: Right;  . Cardioversion N/A 10/02/2015    Procedure: CARDIOVERSION;  Surgeon: Jerline Pain, MD;  Location: [facility] ENDOSCOPY;  Service: Cardiovascular;  Laterality: N/A;    FAMILY  HISTORY: family history includes Breast cancer (age of onset: 77) in her sister; Breast cancer (age of onset: 49) in her mother; Cirrhosis in her mother; Heart failure in her father; Kidney Stones in her sister.  SOCIAL HISTORY:  reports that she quit smoking about 23 years ago. Her smoking use included Cigarettes. She has a 20 pack-year smoking history. She has never used smokeless tobacco. She reports that she drinks about 6.0 oz of alcohol per week. She reports that she does not use illicit drugs.  ALLERGIES: Review of patient's allergies indicates no known allergies.  MEDICATIONS:  Current Outpatient Prescriptions  Medication Sig Dispense Refill  . allopurinol (ZYLOPRIM) 100 MG tablet Take 100 mg by mouth 2 (two) times daily.     [name] amiodarone (PACERONE) 200 MG tablet Take 1 tablet (200 mg total) by mouth daily. 90 tablet 3  . CALCIUM PO Take 1 tablet by mouth daily.     . ergocalciferol (VITAMIN D2) 50000 UNITS capsule Take 50,000 Units by mouth once a week.    [name] LIALDA 1.2 G EC tablet Take 1.2 g by mouth 4 (four) times daily.     . rivaroxaban (XARELTO) 20 MG TABS tablet Take 1 tablet (20 mg total) by mouth daily with supper. 90 tablet 3  . simvastatin (ZOCOR) 20 MG tablet Take 20 mg by mouth daily.      No current facility-administered medications for this encounter.    REVIEW OF SYSTEMS:  A 15 point review of systems is documented in the electronic medical record. This was obtained by the nursing staff. However, I reviewed this with the patient to discuss  relevant findings and make appropriate changes.  Pertinent items are noted in HPI. No pain within the breast nipple discharge or bleeding prior to diagnosis.   PHYSICAL EXAM:   Vitals with BMI 02/04/2016  Height 5' 4"   Weight 143 lbs 10 oz  BMI 92.4  Systolic 462  Diastolic 72  Pulse 76  Respirations 17    General: Alert and oriented, in no acute distress Neck: Neck is supple, no palpable cervical or supraclavicular  lymphadenopathy. Heart: Regular in rate and rhythm with no murmurs, rubs, or gallops. Chest: Clear to auscultation bilaterally, with no rhonchi, wheezes, or rales. Extremities: No cyanosis or edema. Lymphatics: see Neck Exam Skin: No concerning lesions. Psychiatric: Judgment and insight are intact. Affect is appropriate. Breast: No palpable supraclavicular or axillary adenopathy. Bruising in upper-outer quadrant of the left breast. No palpable mass or nipple discharge.   Gynecologic History  Age at first menstrual period? 12  Are you still having periods? No  If you no longer have periods: Have you used hormone replacement? Yes  If YES, for how long? 5 years Obstetric History:  How many children have you carried to term? 1 Your age at first live birth? 70  Pregnant now or trying to get pregnant? No Health Maintenance:  Have you ever had a colonoscopy? Yes  If yes, date? 2013  Have you ever had a bone density? Yes  If yes, date? 2016  Date of your last PAP smear? 2015 Date of your FIRST mammogram? 1975    ECOG = 0  LABORATORY DATA:  Lab Results  Component Value Date   HGB 14.4 05/02/2014   Lab Results  Component Value Date   NA 141 09/29/2015   K 4.1 09/29/2015   CL 106 09/29/2015   CO2 23 09/29/2015   GLUCOSE 92 09/29/2015   CREATININE 0.82 09/29/2015   CALCIUM 8.9 09/29/2015      RADIOGRAPHY: Mm Digital Diagnostic Unilat L  01/28/2016  CLINICAL DATA:  75 year old female status post ultrasound-guided left breast biopsy EXAM: DIAGNOSTIC LEFT MAMMOGRAM POST ULTRASOUND BIOPSY COMPARISON:  Previous exam(s). FINDINGS: Mammographic images were obtained following ultrasound guided biopsy of a left breast mass at 1 o'clock, 4 cm from the nipple. Post biopsy mammogram demonstrates the ribbon shaped biopsy marker to the within the expected location of the left breast mass at 1 o'clock. IMPRESSION: Satisfactory marker placement post ultrasound-guided left breast biopsy. Final  Assessment: Post Procedure Mammograms for Marker Placement Electronically Signed   By: Pamelia Hoit M.D.   On: 01/28/2016 16:42   US Breast Ltd Uni Left Inc Axilla  01/23/2016  CLINICAL DATA:  75 year old female presenting for screening recall of left breast distortion. EXAM: DIGITAL DIAGNOSTIC LEFT MAMMOGRAM WITH 3D TOMOSYNTHESIS AND CAD LEFT BREAST ULTRASOUND COMPARISON:  Previous exam(s). ACR Breast Density Category c: The breast tissue is heterogeneously dense, which may obscure small masses. FINDINGS: There is a persistent distortion in the upper-outer quadrant of the left breast in the posterior depth with a possible tiny associated mass. Mammographic images were processed with CAD. Physical exam of the upper-outer quadrant of the left breast demonstrates a small firm palpable lump at 1 o'clock, 4 cm from the nipple. Ultrasound targeted to the left breast at 1 o'clock, 4 cm from the nipple at the site of the palpable lump, there is a tiny 3-4 mm shadowing mass with possible internal blood flow on color Doppler imaging. Ultrasound of the left axilla demonstrates multiple normal-appearing lymph nodes. IMPRESSION: 1. A 3-4 mm  shadowing mass is identified at 1 o'clock in the left breast, likely corresponding with the area of distortion on the mammogram. 2.  No evidence of left axillary lymphadenopathy. RECOMMENDATION: Ultrasound-guided biopsy is recommended for the shadowing mass in the left breast at 1 o'clock. This has been scheduled for 01/28/2016 at 2 p.m. I have discussed the findings and recommendations with the patient. Results were also provided in writing at the conclusion of the visit. If applicable, a reminder letter will be sent to the patient regarding the next appointment. BI-RADS CATEGORY  4: Suspicious. Electronically Signed   By: Ammie Ferrier M.D.   On: 01/23/2016 11:40   Mm Diag Breast Tomo Uni Left  01/23/2016  CLINICAL DATA:  75 year old female presenting for screening recall of left  breast distortion. EXAM: DIGITAL DIAGNOSTIC LEFT MAMMOGRAM WITH 3D TOMOSYNTHESIS AND CAD LEFT BREAST ULTRASOUND COMPARISON:  Previous exam(s). ACR Breast Density Category c: The breast tissue is heterogeneously dense, which may obscure small masses. FINDINGS: There is a persistent distortion in the upper-outer quadrant of the left breast in the posterior depth with a possible tiny associated mass. Mammographic images were processed with CAD. Physical exam of the upper-outer quadrant of the left breast demonstrates a small firm palpable lump at 1 o'clock, 4 cm from the nipple. Ultrasound targeted to the left breast at 1 o'clock, 4 cm from the nipple at the site of the palpable lump, there is a tiny 3-4 mm shadowing mass with possible internal blood flow on color Doppler imaging. Ultrasound of the left axilla demonstrates multiple normal-appearing lymph nodes. IMPRESSION: 1. A 3-4 mm shadowing mass is identified at 1 o'clock in the left breast, likely corresponding with the area of distortion on the mammogram. 2.  No evidence of left axillary lymphadenopathy. RECOMMENDATION: Ultrasound-guided biopsy is recommended for the shadowing mass in the left breast at 1 o'clock. This has been scheduled for 01/28/2016 at 2 p.m. I have discussed the findings and recommendations with the patient. Results were also provided in writing at the conclusion of the visit. If applicable, a reminder letter will be sent to the patient regarding the next appointment. BI-RADS CATEGORY  4: Suspicious. Electronically Signed   By: Ammie Ferrier M.D.   On: 01/23/2016 11:40   Mm Screening Breast Tomo Bilateral  01/14/2016  CLINICAL DATA:  Screening. EXAM: DIGITAL SCREENING BILATERAL MAMMOGRAM WITH 3D TOMO WITH CAD COMPARISON:  Previous exam(s). ACR Breast Density Category c: The breast tissue is heterogeneously dense, which may obscure small masses. FINDINGS: In the left breast, possible mass with distortion warrants further evaluation. In  the right breast, no findings suspicious for malignancy. Images were processed with CAD. IMPRESSION: Further evaluation is suggested for possible mass with distortion in the left breast. RECOMMENDATION: Diagnostic mammogram and possibly ultrasound of the left breast. (Code:[id number]) The patient will be contacted regarding the findings, and additional imaging will be scheduled. BI-RADS CATEGORY  0: Incomplete. Need additional imaging evaluation and/or prior mammograms for comparison. Electronically Signed   By: Lajean Manes M.D.   On: 01/14/2016 11:36   [place] Lt Breast Bx W Loc Dev 1st Lesion Img Bx Spec US Guide  01/29/2016  ADDENDUM REPORT: 01/29/2016 14:18 ADDENDUM: Pathology revealed grade I invasive ductal carcinoma in the left breast. This was found to be concordant by Dr. Pamelia Hoit. Pathology results were discussed with the patient by telephone. The patient reported doing well after the biopsy. Post biopsy instructions and care were reviewed and questions were answered. The patient was encouraged  to call [facility] for any additional concerns. The patient was referred to the [facility] at the [facility] on February 04, 2016. Pathology results reported by Susa Raring RN, BSN on 01/29/2016. Electronically Signed   By: Pamelia Hoit M.D.   On: 01/29/2016 14:18  01/29/2016  CLINICAL DATA:  75 year old female for ultrasound-guided biopsy of a left breast mass at 1 o'clock, 4 cm from the nipple EXAM: ULTRASOUND GUIDED LEFT BREAST CORE NEEDLE BIOPSY COMPARISON:  Previous exam(s). PROCEDURE: I met with the patient and we discussed the procedure of ultrasound-guided biopsy, including benefits and alternatives. We discussed the high likelihood of a successful procedure. We discussed the risks of the procedure including infection, bleeding, tissue injury, clip migration, and inadequate sampling. Informed written consent was given. The usual time-out  protocol was performed immediately prior to the procedure. Using sterile technique and 1% Lidocaine as local anesthetic, under direct ultrasound visualization, a 12 gauge vacuum-assisted device was used to perform biopsy of a suspicious left breast mass at 1 o'clock, 4 cm from the nipple using a lateral to medial approach. At the conclusion of the procedure, a ribbon shaped tissue marker clip was deployed into the biopsy cavity. Follow-up 2-view mammogram was performed and dictated separately. IMPRESSION: Ultrasound-guided biopsy of a left breast mass at 1 o'clock, 4 cm from the nipple. No apparent complications. Electronically Signed: By: Pamelia Hoit M.D. On: 01/28/2016 16:41    IMPRESSION: Ms. Derenzo is a 75 yo female with clinical stage IA breast cancer of the upper-outer quadrant of the left female breast. She is a good candidate for a lumpectomy,  sentinel node biopsy and hormone therapy. She is not interested in a mastectomy for local management.  PLAN: I will follow up with her after surgery to discuss radiation therapy or hormone therapy. Doubt she will require both radiation and hormonal therapy given her age and tumor characteristics at this time.  She has a genetics appointment 03/03/2016 at 2 pm given her family history of 2 first-degree relatives with breast cancer.    ------------------------------------------------  Blair Promise, PhD, MD    This document serves as a record of services personally performed by Gery Pray, MD. It was created on his behalf by Lendon Collar, a trained medical scribe. The creation of this record is based on the scribe's personal observations and the provider's statements to them. This document has been checked and approved by the attending provider.

## 2016-02-10 ENCOUNTER — Telehealth: Payer: Self-pay | Admitting: *Deleted

## 2016-02-10 NOTE — Telephone Encounter (Signed)
Left message for a return phone call to follow up after [facility] 02/04/16.

## 2016-02-12 ENCOUNTER — Encounter: Payer: Self-pay | Admitting: Physician Assistant

## 2016-02-12 ENCOUNTER — Ambulatory Visit (INDEPENDENT_AMBULATORY_CARE_PROVIDER_SITE_OTHER): Payer: Medicare Other | Admitting: Physician Assistant

## 2016-02-12 VITALS — BP 120/60 | HR 77 | Ht 64.0 in | Wt 147.8 lb

## 2016-02-12 DIAGNOSIS — I10 Essential (primary) hypertension: Secondary | ICD-10-CM | POA: Diagnosis not present

## 2016-02-12 DIAGNOSIS — I481 Persistent atrial fibrillation: Secondary | ICD-10-CM | POA: Diagnosis not present

## 2016-02-12 DIAGNOSIS — C50912 Malignant neoplasm of unspecified site of left female breast: Secondary | ICD-10-CM | POA: Diagnosis not present

## 2016-02-12 DIAGNOSIS — I4819 Other persistent atrial fibrillation: Secondary | ICD-10-CM

## 2016-02-12 NOTE — Progress Notes (Addendum)
Cardiology Office Note   Date:  02/12/2016   ID:  Michele Green, DOB [birth year], MRN [id number]  PCP:  [name], MD  Cardiologist:  [name]     Chief Complaint  Patient presents with  . Follow-up    seen for [name], preop clearance for L lumpectomy      History of Present Illness: Michele Green is a 75 y.o. female who presents for surgical clearance. She has a history of persistent atrial fibrillation on Xarelto, hypertension and chronic diastolic heart failure. She had a previous electrical cardioversion on 10/02/2015. Post cardioversion course was complicated by bradycardia into the 40s to 50s, digoxin and atenolol were discontinued. She was maintained on Xarelto and amiodarone only. Her last echocardiogram obtained on 01/19/2016 showed EF 37-34%, normal diastolic function, moderate MR, severely dilated left atrium. She has been taking amiodarone and Xarelto, however no rate control medication. She was recently diagnosed with left breast cancer and has been seen by Dr. Krista Blue of oncology who recommended surgical resection, given the small size of the tumor and low grade, risk of recurrence is quite low. Note biopsy performed on 3/80/2017 revealed invasive ductal carcinoma grade 1. Showed the tumor to be ER 100%, PR 100%, HER-2/neu negative. Dr. Krista Blue did not recommend adjuvant chemotherapy, however given her strong ER and PR positivity, it was recommended for the patient to consider adjuvant aromatase inhibitor to reduce her risk of cancer recurrence.  Patient presented today for preoperative clearance requested by Dr. [name] with [facility], she is very active at home, and has never experienced any chest pain. She denies history of MI. She has been on Xarelto for about a year. Recent echo shows severely dilated left atrium, the chance of her staying in normal sinus rhythm after conversion is low. She denies any recent fever, chill, cough or any symptom  of heart failure including lower extremity edema or orthopnea.  She was referred by Dr. [name] with [facility]   Past Medical History  Diagnosis Date  . Osteopenia   . Hypertension   . Colon polyp 02/2012  . Colitis   . Dupuytren contracture     bilateral hands  . Wears glasses   . Arthritis   . Persistent atrial fibrillation ([place]) 08/22/2015    Started late August or early September.   . On continuous oral anticoagulation 08/22/2015    Started on Xarelto 08/12/2015   . Dysrhythmia   . Shortness of breath dyspnea   . Breast cancer of upper-outer quadrant of left female breast ([place]) 02/02/2016  . Breast cancer [facility])     Past Surgical History  Procedure Laterality Date  . Dupuytren contracture release  2001    leftx2  . Tonsillectomy  age 41  . Dupuytren contracture release  1990    right  . Colonoscopy    . Dupuytren contracture release Right 05/02/2014    Procedure: EXCISION DUPUYTRENS RIGHT PALMAR/SMALL ;  Surgeon: Cammie Sickle, MD;  Location: [facility];  Service: Orthopedics;  Laterality: Right;  . Cardioversion N/A 10/02/2015    Procedure: CARDIOVERSION;  Surgeon: Jerline Pain, MD;  Location: [facility] ENDOSCOPY;  Service: Cardiovascular;  Laterality: N/A;     Current Outpatient Prescriptions  Medication Sig Dispense Refill  . allopurinol (ZYLOPRIM) 100 MG tablet Take 100 mg by mouth daily.     [name] amiodarone (PACERONE) 200 MG tablet Take 1 tablet (200 mg total) by mouth daily. 90 tablet  3  . CALCIUM PO Take 1 tablet by mouth daily. Reported on 02/04/2016    . ergocalciferol (VITAMIN D2) 50000 UNITS capsule Take 50,000 Units by mouth once a week. Reported on 02/04/2016    . LIALDA 1.2 G EC tablet Take 1.2 g by mouth 3 (three) times daily.     . rivaroxaban (XARELTO) 20 MG TABS tablet Take 1 tablet (20 mg total) by mouth daily with supper. 90 tablet 3  . simvastatin (ZOCOR) 20 MG tablet Take 20 mg by mouth daily.      No current  facility-administered medications for this visit.    Allergies:   Review of patient's allergies indicates no known allergies.    Social History:  The patient  reports that she quit smoking about 23 years ago. Her smoking use included Cigarettes. She has a 20 pack-year smoking history. She has never used smokeless tobacco. She reports that she drinks about 6.0 oz of alcohol per week. She reports that she does not use illicit drugs.   Family History:  The patient's family history includes Breast cancer (age of onset: 39) in her sister; Breast cancer (age of onset: 24) in her mother; Cirrhosis in her mother; Heart attack in her father; Heart failure in her father; Kidney Stones in her sister.    ROS:  Please see the history of present illness.   Otherwise, review of systems are positive for none.   All other systems are reviewed and negative.    PHYSICAL EXAM: VS:  BP 120/60 mmHg  Pulse 77  Ht _0  (1.626 m)  Wt 147 lb 12.8 oz (67.042 kg)  BMI 25.36 kg/m2 , BMI Body mass index is 25.36 kg/(m^2). GEN: Well nourished, well developed, in no acute distress HEENT: normal Neck: no JVD, carotid bruits, or masses Cardiac: RRR; no rubs, or gallops,no edema  3/6 systolic murmur Respiratory:  clear to auscultation bilaterally, normal work of breathing GI: soft, nontender, nondistended, + BS MS: no deformity or atrophy Skin: warm and dry, no rash Neuro:  Strength and sensation are intact Psych: euthymic mood, full affect   EKG:  EKG is ordered today. The ekg ordered today demonstrates normal sinus rhythm without obvious ST-T wave changes.   Recent Labs: 02/04/2016: ALT 37; BUN 14.0; Creatinine 0.8; HGB 14.0; Platelets 143 Occ Large & giant platelets*; Potassium 3.9; Sodium 143    Lipid Panel No results found for: CHOL, TRIG, HDL, CHOLHDL, VLDL, LDLCALC, LDLDIRECT    Wt Readings from Last 3 Encounters:  02/12/16 147 lb 12.8 oz (67.042 kg)  02/04/16 143 lb 9.6 oz (65.137 kg)  10/21/15  142 lb 12.8 oz (64.774 kg)      Other studies Reviewed: Additional studies/ records that were reviewed today include:   Echo 01/19/2016 LV EF: 60% - 65%  ------------------------------------------------------------------- Indications: Mitral Valve Disease (I05.9).  ------------------------------------------------------------------- History: PMH: Dyspnea. Atrial fibrillation. Congestive heart failure. Risk factors: Family history of coronary artery disease. Former tobacco use. Hypertension.  ------------------------------------------------------------------- Study Conclusions  - Left ventricle: The cavity size was normal. Wall thickness was  normal. Systolic function was normal. The estimated ejection  fraction was in the range of 60% to 65%. Left ventricular  diastolic function parameters were normal. - Mitral valve: MV is thickened, difficult to see well Cannot  exclude some prolapse.  MR appears more moderate, some coursing posterior into LA. There  was moderate regurgitation. - Left atrium: The atrium was severely dilated.    Review of the above records demonstrates:  Patient with paroxysmal atrial fibrillation currently maintaining sinus rhythm by amiodarone recently diagnosed with left breast cancer pending lumpectomy. Cardiology has been consulted for preoperative clearance.    ASSESSMENT AND PLAN:  1. preop clearance for left lumpectomy  - Patient has been very active at home without any exertional symptom, she has no history of MI, there has been no change on the EKG, no further ischemic workup is warranted in this case, she is cleared from cardiology perspective to proceed with surgery.  - Per current guidelines, prior to surgical procedure, may hold Xarelto for 3 doses prior to surgery to decrease bleeding risk while minimizing stroke risk. Recommend restarting Xarelto as soon as possible after surgery once deemed low bleeding risk by the  surgeon to minimize interruption. Recommend continue amiodarone to help maintain normal sinus rhythm. She has severely dilated left atrium, she is at higher risk of going back in atrial fibrillation, cardiology will be available perioperatively if need arise. She is not on a beta blocker due to history of bradycardia.  - The case has been discussed with [name] who agrees with the plan. Also discussed with [name] regarding aromatase inhibitor, in this case, if it is deemed beneficial for her to be on aromatase inhibitor, it would be ok from cardiac perspective. Although risk of arrhythmia, valvular dysfunction and pericarditis does increase, we can address it later with other treatment if it will offer her higher chance of remission.   2. Paroxysmal atrial fibrillation on Xarelto: CHA2DS2-Vasc score 4 (HTN, age, female)   3. Hypertension: She is currently not on any blood pressure medication, however her blood pressure is 120/60. Questionable diagnosis of hypertension.  4. chronic diastolic heart failure: She is euvolemic on physical exam. Recent echocardiogram revealed showing moderate MR, otherwise normal EF.  5. Moderate MR: on recent echo, do not expect causing any problem during breast surgery with only moderate MR    Current medicines are reviewed at length with the patient today.  The patient does not have concerns regarding medicines.  The following changes have been made:  no change  Labs/ tests ordered today include:   Orders Placed This Encounter  Procedures  . EKG 12-Lead     Disposition:   FU with [name] after surgery    Signed, Almyra Deforest, [place]  02/12/2016 6:52 PM    [address], [place], [place]  [address] Phone: [phone]; Fax: [phone]

## 2016-02-12 NOTE — Patient Instructions (Signed)
Medication Instructions:  Your physician recommends that you continue on your current medications as directed. Please refer to the Current Medication list given to you today.   Labwork: NONE ORDERED  Testing/Procedures: NONE ORDERED  Follow-Up: Your physician recommends that you keep your scheduled follow-up appointment AS SCHEDULED WITH [name]    Any Other Special Instructions Will Be Listed Below (If Applicable). You have been cleared for surgery.  [facility] has been notified.  If you need a refill on your cardiac medications before your next appointment, please call your pharmacy.

## 2016-02-16 ENCOUNTER — Encounter (HOSPITAL_BASED_OUTPATIENT_CLINIC_OR_DEPARTMENT_OTHER): Payer: Self-pay | Admitting: *Deleted

## 2016-02-16 ENCOUNTER — Other Ambulatory Visit: Payer: Self-pay | Admitting: General Surgery

## 2016-02-16 ENCOUNTER — Other Ambulatory Visit: Payer: Self-pay | Admitting: *Deleted

## 2016-02-16 DIAGNOSIS — C50412 Malignant neoplasm of upper-outer quadrant of left female breast: Secondary | ICD-10-CM

## 2016-02-16 NOTE — Progress Notes (Signed)
Bring all medications. Coming Wednesday for BMET .

## 2016-02-18 ENCOUNTER — Telehealth: Payer: Self-pay | Admitting: Hematology

## 2016-02-18 ENCOUNTER — Encounter (HOSPITAL_BASED_OUTPATIENT_CLINIC_OR_DEPARTMENT_OTHER)
Admission: RE | Admit: 2016-02-18 | Discharge: 2016-02-18 | Disposition: A | Discharge status: Home / Self Care | Payer: Medicare Other | Source: Ambulatory Visit | Attending: General Surgery | Admitting: General Surgery

## 2016-02-18 DIAGNOSIS — Z01812 Encounter for preprocedural laboratory examination: Secondary | ICD-10-CM | POA: Insufficient documentation

## 2016-02-18 DIAGNOSIS — C50412 Malignant neoplasm of upper-outer quadrant of left female breast: Secondary | ICD-10-CM | POA: Insufficient documentation

## 2016-02-18 LAB — BASIC METABOLIC PANEL
Anion gap: 11 (ref 5–15)
BUN: 12 mg/dL (ref 6–20)
CHLORIDE: 108 mmol/L (ref 101–111)
CO2: 23 mmol/L (ref 22–32)
CREATININE: 0.75 mg/dL (ref 0.44–1.00)
Calcium: 9.3 mg/dL (ref 8.9–10.3)
Glucose, Bld: 94 mg/dL (ref 65–99)
Potassium: 3.9 mmol/L (ref 3.5–5.1)
SODIUM: 142 mmol/L (ref 135–145)

## 2016-02-18 NOTE — Telephone Encounter (Signed)
s.w. pt and advised on April appt

## 2016-02-19 ENCOUNTER — Ambulatory Visit
Admission: RE | Admit: 2016-02-19 | Discharge: 2016-02-19 | Disposition: A | Discharge status: Home / Self Care | Payer: Medicare Other | Source: Ambulatory Visit | Attending: General Surgery | Admitting: General Surgery

## 2016-02-19 DIAGNOSIS — C50412 Malignant neoplasm of upper-outer quadrant of left female breast: Secondary | ICD-10-CM

## 2016-02-20 NOTE — H&P (Signed)
History of Present Illness [name] T. Jerolyn Flenniken MD; 02/04/2016 4:35 PM) The patient is a 75 year old female who presents with breast cancer. She is a 75 YO postmenopausal female referred by Dr. Pamelia Hoit for evaluation of recently diagnosed carcinoma of the left breast. She recently presented for a screening mamogram revealing Distortion and a possible tiny mass in the upper-outer left breast. Subsequent imaging included diagnostic mamogram showing a small mass and ultrasound showing a 3-4 mm shadowing mass at the 1:00 position in the left breast. An ultrasound guided breast biopsy was performed on 01/28/2016 with pathology revealing invasive ductal carcinoma of the breast. She is seen now in [facility] for initial treatment planning. She has experienced no breast symptoms, specifically lump or pain or skin changes or nipple discharge. She does not have a personal history of any previous breast problems. Her mother and one sister had diagnoses of breast cancer.  Findings at that time were the following: Tumor size: 0.4 cm Tumor grade: 1 Estrogen Receptor: Pos Progesterone Receptor: Pos Her-2 neu: Neg Lymph node status: Neg    Problem List/Past Medical Edward Jolly, MD; 02/04/2016 4:39 PM) ATRIAL FIBRILLATION, CURRENTLY IN SINUS RHYTHM (I48.91) COLITIS (K52.9) GOUT, ARTHRITIS (M10.9)  Past Surgical History Edward Jolly, MD; 02/04/2016 4:40 PM) Hand surgery  Allergies Edward Jolly, MD; 02/04/2016 4:43 PM) No Known Allergies03/15/2017  Medication History Edward Jolly, MD; 02/04/2016 4:43 PM) Medications Reconciled Xarelto (20MG Tablet, Oral daily) Active. Simvastatin (20MG Tablet, Oral) Active. Amiodarone HCl (200MG Tablet, Oral daily) Active. Allopurinol (100MG Tablet, Oral daily) Active. Lialda (1.2GM Tablet DR, Oral three times daily) Active.    Review of Systems [name] T. Tiernan Millikin MD; 02/04/2016 4:37 PM) General Note: no fever chills or  unusual fatigue   Respiratory Note: mild shortness of breath with exertion   Breast Note: as above   Cardiovascular Note: no chest pain or recent palpitations   Gastrointestinal Note: no recent diarrhea or blood per rectum   Musculoskeletal Note: mild joint aching   Hematology Note: bruises easily     Physical Exam [name] T. Kailand Seda MD; 02/04/2016 4:46 PM) General Note: General: Alert, well-developed and well nourished older Caucasian female, in no distress Skin: Warm and dry without rash or infection. HEENT: No palpable masses or thyromegaly. Sclera nonicteric. Pupils equal round and reactive. Lymph nodes: No cervical, supraclavicular, or inguinal nodes palpable. Breasts: bruising lateral left breast at biopsy site but no palpable mass. No skin changes or nipple discharge or crusting Lungs: Breath sounds clear and equal. No wheezing or increased work of breathing. Cardiovascular: Regular rate and rhythm. 3 to 4/6 systolic murmur. No JVD or edema. Abdomen: Nondistended. Soft and nontender. No masses palpable. No organomegaly. No palpable hernias. Extremities: No edema or joint swelling or deformity. No chronic venous stasis changes. Neurologic: Alert and fully oriented. Gait normal. No focal weakness. Psychiatric: Normal mood and affect. Thought content appropriate with normal judgement and insight     Assessment & Plan [name] T. Angeliyah Kirkey MD; 02/04/2016 4:51 PM) MALIGNANT NEOPLASM OF UPPER-OUTER QUADRANT OF LEFT FEMALE BREAST (C50.412) Impression: 75 year old female with a new diagnosis of cancer of the left breast, upper outer quadrant. Clinical stage 1A, ER Pos, PR Pos, HER-2 Neg. I discussed with the patient and her husband today initial surgical treatment options. We discussed options of breast conservation with lumpectomy or total mastectomy and sentinal lymph node biopsy/dissection.. After discussion they have elected to proceed with breast conservation and  I believe she would  be an ideal candidate. if her tumor is as small and low-grade as indicated she could avoid radiation.We discussed the indications and nature of the procedure, and expected recovery, in detail. Surgical risks including anesthetic complications, cardiorespiratory complications, bleeding, infection, wound healing complications, blood clots, lymphedema, local and distant recurrence and possible need for further surgery based on the final pathology was discussed and understood. Chemotherapy, hormonal therapy and radiation therapy have been discussed. They have been provided with literature regarding the treatment of breast cancer. All questions were answered. They understand and agree to proceed and we will go ahead with scheduling after obtaining cardiac clearance   Current Plans Referred to Genetic Counseling, for evaluation and follow up [facility]). Routine. radioactive seed localized left breast lumpectomy and axillarsisens an outpatiente biopsy under general anesthesia as an outpatient

## 2016-02-23 ENCOUNTER — Encounter (HOSPITAL_BASED_OUTPATIENT_CLINIC_OR_DEPARTMENT_OTHER)
Admission: RE | Disposition: A | Discharge status: Home / Self Care | Payer: Self-pay | Source: Ambulatory Visit | Attending: General Surgery

## 2016-02-23 ENCOUNTER — Ambulatory Visit
Admission: RE | Admit: 2016-02-23 | Discharge: 2016-02-23 | Disposition: A | Discharge status: Home / Self Care | Payer: Medicare Other | Source: Ambulatory Visit | Attending: General Surgery | Admitting: General Surgery

## 2016-02-23 ENCOUNTER — Ambulatory Visit (HOSPITAL_BASED_OUTPATIENT_CLINIC_OR_DEPARTMENT_OTHER)
Admission: RE | Admit: 2016-02-23 | Discharge: 2016-02-23 | Disposition: A | Discharge status: Home / Self Care | Payer: Medicare Other | Source: Ambulatory Visit | Attending: General Surgery | Admitting: General Surgery

## 2016-02-23 ENCOUNTER — Encounter (HOSPITAL_BASED_OUTPATIENT_CLINIC_OR_DEPARTMENT_OTHER): Payer: Self-pay | Admitting: *Deleted

## 2016-02-23 ENCOUNTER — Encounter (HOSPITAL_COMMUNITY)
Admission: RE | Admit: 2016-02-23 | Discharge: 2016-02-23 | Disposition: A | Discharge status: Home / Self Care | Payer: Medicare Other | Source: Ambulatory Visit | Attending: General Surgery | Admitting: General Surgery

## 2016-02-23 ENCOUNTER — Ambulatory Visit (HOSPITAL_BASED_OUTPATIENT_CLINIC_OR_DEPARTMENT_OTHER): Payer: Medicare Other | Admitting: Anesthesiology

## 2016-02-23 DIAGNOSIS — I4891 Unspecified atrial fibrillation: Secondary | ICD-10-CM | POA: Diagnosis not present

## 2016-02-23 DIAGNOSIS — M109 Gout, unspecified: Secondary | ICD-10-CM | POA: Diagnosis not present

## 2016-02-23 DIAGNOSIS — Z87891 Personal history of nicotine dependence: Secondary | ICD-10-CM | POA: Diagnosis not present

## 2016-02-23 DIAGNOSIS — Z7901 Long term (current) use of anticoagulants: Secondary | ICD-10-CM | POA: Insufficient documentation

## 2016-02-23 DIAGNOSIS — C50912 Malignant neoplasm of unspecified site of left female breast: Secondary | ICD-10-CM | POA: Diagnosis present

## 2016-02-23 DIAGNOSIS — C50412 Malignant neoplasm of upper-outer quadrant of left female breast: Secondary | ICD-10-CM

## 2016-02-23 DIAGNOSIS — Z780 Asymptomatic menopausal state: Secondary | ICD-10-CM | POA: Insufficient documentation

## 2016-02-23 DIAGNOSIS — I10 Essential (primary) hypertension: Secondary | ICD-10-CM | POA: Diagnosis not present

## 2016-02-23 DIAGNOSIS — Z170 Estrogen receptor positive status [ER+]: Secondary | ICD-10-CM | POA: Insufficient documentation

## 2016-02-23 DIAGNOSIS — Z79899 Other long term (current) drug therapy: Secondary | ICD-10-CM | POA: Insufficient documentation

## 2016-02-23 HISTORY — PX: BREAST LUMPECTOMY WITH RADIOACTIVE SEED LOCALIZATION: SHX6424

## 2016-02-23 HISTORY — PX: BREAST LUMPECTOMY: SHX2

## 2016-02-23 SURGERY — BREAST LUMPECTOMY WITH RADIOACTIVE SEED LOCALIZATION
Anesthesia: General | Site: Breast | Laterality: Left

## 2016-02-23 MED ORDER — FENTANYL CITRATE (PF) 100 MCG/2ML IJ SOLN
INTRAMUSCULAR | Status: AC
  Filled 2016-02-23: qty 2

## 2016-02-23 MED ORDER — PROPOFOL 10 MG/ML IV BOLUS
INTRAVENOUS | Status: DC | PRN
  Administered 2016-02-23: 20 mg via INTRAVENOUS
  Administered 2016-02-23: 200 mg via INTRAVENOUS
  Administered 2016-02-23: 50 mg via INTRAVENOUS

## 2016-02-23 MED ORDER — SODIUM CHLORIDE 0.9 % IJ SOLN
INTRAVENOUS | Status: DC | PRN
  Administered 2016-02-23: 6 mL

## 2016-02-23 MED ORDER — DEXAMETHASONE SODIUM PHOSPHATE 4 MG/ML IJ SOLN
INTRAMUSCULAR | Status: DC | PRN
  Administered 2016-02-23: 10 mg via INTRAVENOUS

## 2016-02-23 MED ORDER — KETOROLAC TROMETHAMINE 30 MG/ML IJ SOLN
INTRAMUSCULAR | Status: AC
  Filled 2016-02-23: qty 1

## 2016-02-23 MED ORDER — MIDAZOLAM HCL 2 MG/2ML IJ SOLN
INTRAMUSCULAR | Status: AC
  Filled 2016-02-23: qty 2

## 2016-02-23 MED ORDER — MIDAZOLAM HCL 2 MG/2ML IJ SOLN
1.0000 mg | INTRAMUSCULAR | Status: DC | PRN
  Administered 2016-02-23: 2 mg via INTRAVENOUS

## 2016-02-23 MED ORDER — SODIUM CHLORIDE 0.9 % IJ SOLN
INTRAMUSCULAR | Status: AC
  Filled 2016-02-23: qty 10

## 2016-02-23 MED ORDER — LACTATED RINGERS IV SOLN
INTRAVENOUS | Status: DC
  Administered 2016-02-23 (×2): via INTRAVENOUS

## 2016-02-23 MED ORDER — OXYCODONE HCL 5 MG PO TABS
5.0000 mg | ORAL_TABLET | Freq: Once | ORAL | Status: AC
  Administered 2016-02-23: 5 mg via ORAL

## 2016-02-23 MED ORDER — CHLORHEXIDINE GLUCONATE 4 % EX LIQD: 1.0000 "application " | Freq: Once | CUTANEOUS | Status: DC

## 2016-02-23 MED ORDER — PROMETHAZINE HCL 25 MG/ML IJ SOLN: 6.2500 mg | INTRAMUSCULAR | Status: DC | PRN

## 2016-02-23 MED ORDER — LACTATED RINGERS IV SOLN: INTRAVENOUS | Status: DC

## 2016-02-23 MED ORDER — BUPIVACAINE-EPINEPHRINE (PF) 0.5% -1:200000 IJ SOLN
INTRAMUSCULAR | Status: DC | PRN
  Administered 2016-02-23: 30 mL

## 2016-02-23 MED ORDER — CEFAZOLIN SODIUM 1 G IJ SOLR
2.0000 g | INTRAMUSCULAR | Status: AC
  Administered 2016-02-23: 2 g via INTRAVENOUS

## 2016-02-23 MED ORDER — ONDANSETRON HCL 4 MG/2ML IJ SOLN
INTRAMUSCULAR | Status: DC | PRN
  Administered 2016-02-23: 4 mg via INTRAVENOUS

## 2016-02-23 MED ORDER — TECHNETIUM TC 99M SULFUR COLLOID FILTERED
1.0000 | Freq: Once | INTRAVENOUS | Status: AC | PRN
  Administered 2016-02-23: 1 via INTRADERMAL

## 2016-02-23 MED ORDER — GLYCOPYRROLATE 0.2 MG/ML IJ SOLN: 0.2000 mg | Freq: Once | INTRAMUSCULAR | Status: DC | PRN

## 2016-02-23 MED ORDER — OXYCODONE HCL 5 MG PO TABS
ORAL_TABLET | ORAL | Status: AC
  Filled 2016-02-23: qty 1

## 2016-02-23 MED ORDER — FENTANYL CITRATE (PF) 100 MCG/2ML IJ SOLN
INTRAMUSCULAR | Status: DC | PRN
  Administered 2016-02-23 (×2): 25 ug via INTRAVENOUS

## 2016-02-23 MED ORDER — FENTANYL CITRATE (PF) 100 MCG/2ML IJ SOLN
25.0000 ug | INTRAMUSCULAR | Status: DC | PRN
  Administered 2016-02-23: 25 ug via INTRAVENOUS

## 2016-02-23 MED ORDER — METHYLENE BLUE 0.5 % INJ SOLN
INTRAVENOUS | Status: AC
  Filled 2016-02-23: qty 10

## 2016-02-23 MED ORDER — LIDOCAINE HCL (CARDIAC) 20 MG/ML IV SOLN
INTRAVENOUS | Status: DC | PRN
  Administered 2016-02-23: 50 mg via INTRAVENOUS

## 2016-02-23 MED ORDER — CEFAZOLIN SODIUM-DEXTROSE 2-4 GM/100ML-% IV SOLN
INTRAVENOUS | Status: AC
  Filled 2016-02-23: qty 100

## 2016-02-23 MED ORDER — FENTANYL CITRATE (PF) 100 MCG/2ML IJ SOLN
50.0000 ug | INTRAMUSCULAR | Status: DC | PRN
  Administered 2016-02-23: 100 ug via INTRAVENOUS

## 2016-02-23 MED ORDER — BUPIVACAINE-EPINEPHRINE (PF) 0.5% -1:200000 IJ SOLN
INTRAMUSCULAR | Status: DC | PRN
  Administered 2016-02-23: 10 mL

## 2016-02-23 MED ORDER — HYDROCODONE-ACETAMINOPHEN 5-325 MG PO TABS: 1.0000 | ORAL_TABLET | ORAL | Status: DC | PRN

## 2016-02-23 MED ORDER — KETOROLAC TROMETHAMINE 30 MG/ML IJ SOLN
INTRAMUSCULAR | Status: DC | PRN
  Administered 2016-02-23: 30 mg via INTRAVENOUS

## 2016-02-23 MED ORDER — SCOPOLAMINE 1 MG/3DAYS TD PT72: 1.0000 | MEDICATED_PATCH | Freq: Once | TRANSDERMAL | Status: DC | PRN

## 2016-02-23 SURGICAL SUPPLY — 51 items
APPLIER CLIP 9.375 MED OPEN (MISCELLANEOUS) ×3
APR CLP MED 9.3 20 MLT OPN (MISCELLANEOUS) ×1
BINDER BREAST LRG (GAUZE/BANDAGES/DRESSINGS) IMPLANT
BINDER BREAST MEDIUM (GAUZE/BANDAGES/DRESSINGS) ×2 IMPLANT
BINDER BREAST XLRG (GAUZE/BANDAGES/DRESSINGS) IMPLANT
BINDER BREAST XXLRG (GAUZE/BANDAGES/DRESSINGS) IMPLANT
BLADE SURG 15 STRL LF DISP TIS (BLADE) ×1 IMPLANT
BLADE SURG 15 STRL SS (BLADE) ×3
CANISTER SUC SOCK COL 7IN (MISCELLANEOUS) IMPLANT
CANISTER SUCT 1200ML W/VALVE (MISCELLANEOUS) IMPLANT
CHLORAPREP W/TINT 26ML (MISCELLANEOUS) ×3 IMPLANT
CLIP APPLIE 9.375 MED OPEN (MISCELLANEOUS) ×1 IMPLANT
COVER BACK TABLE 60X90IN (DRAPES) ×3 IMPLANT
COVER MAYO STAND STRL (DRAPES) ×3 IMPLANT
COVER PROBE W GEL 5X96 (DRAPES) ×3 IMPLANT
DECANTER SPIKE VIAL GLASS SM (MISCELLANEOUS) IMPLANT
DEVICE DUBIN W/COMP PLATE 8390 (MISCELLANEOUS) ×3 IMPLANT
DRAPE LAPAROSCOPIC ABDOMINAL (DRAPES) ×3 IMPLANT
DRAPE UTILITY XL STRL (DRAPES) ×3 IMPLANT
ELECT COATED BLADE 2.86 ST (ELECTRODE) ×3 IMPLANT
ELECT REM PT RETURN 9FT ADLT (ELECTROSURGICAL) ×3
ELECTRODE REM PT RTRN 9FT ADLT (ELECTROSURGICAL) ×1 IMPLANT
GLOVE BIOGEL PI IND STRL 8 (GLOVE) ×1 IMPLANT
GLOVE BIOGEL PI INDICATOR 8 (GLOVE) ×2
GLOVE ECLIPSE 7.5 STRL STRAW (GLOVE) ×3 IMPLANT
GLOVE SURG SS PI 7.0 STRL IVOR (GLOVE) ×2 IMPLANT
GOWN STRL REUS W/ TWL LRG LVL3 (GOWN DISPOSABLE) ×1 IMPLANT
GOWN STRL REUS W/ TWL XL LVL3 (GOWN DISPOSABLE) ×1 IMPLANT
GOWN STRL REUS W/TWL LRG LVL3 (GOWN DISPOSABLE) ×3
GOWN STRL REUS W/TWL XL LVL3 (GOWN DISPOSABLE) ×3
ILLUMINATOR WAVEGUIDE N/F (MISCELLANEOUS) ×2 IMPLANT
KIT MARKER MARGIN INK (KITS) ×3 IMPLANT
LIQUID BAND (GAUZE/BANDAGES/DRESSINGS) ×3 IMPLANT
NDL HYPO 25X1 1.5 SAFETY (NEEDLE) ×2 IMPLANT
NDL SAFETY ECLIPSE 18X1.5 (NEEDLE) ×1 IMPLANT
NEEDLE HYPO 18GX1.5 SHARP (NEEDLE) ×3
NEEDLE HYPO 25X1 1.5 SAFETY (NEEDLE) ×6 IMPLANT
NS IRRIG 1000ML POUR BTL (IV SOLUTION) ×2 IMPLANT
PACK BASIN DAY SURGERY FS (CUSTOM PROCEDURE TRAY) ×3 IMPLANT
PENCIL BUTTON HOLSTER BLD 10FT (ELECTRODE) ×3 IMPLANT
SLEEVE SCD COMPRESS KNEE MED (MISCELLANEOUS) ×3 IMPLANT
SPONGE LAP 4X18 X RAY DECT (DISPOSABLE) ×3 IMPLANT
SUT MON AB 5-0 PS2 18 (SUTURE) ×3 IMPLANT
SUT SILK 2 0 SH (SUTURE) IMPLANT
SUT VICRYL 3-0 CR8 SH (SUTURE) ×3 IMPLANT
SYR CONTROL 10ML LL (SYRINGE) ×6 IMPLANT
TOWEL OR 17X24 6PK STRL BLUE (TOWEL DISPOSABLE) ×3 IMPLANT
TOWEL OR NON WOVEN STRL DISP B (DISPOSABLE) ×3 IMPLANT
TUBE CONNECTING 20'X1/4 (TUBING)
TUBE CONNECTING 20X1/4 (TUBING) IMPLANT
YANKAUER SUCT BULB TIP NO VENT (SUCTIONS) IMPLANT

## 2016-02-23 NOTE — Progress Notes (Signed)
Emotional support during breast injections °

## 2016-02-23 NOTE — Op Note (Signed)
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 After preoperative evaluation and discussion regarding indications and risks detailed elsewhere we have elected to proceed with radioactive seed localized left breast lumpectomy and left axillary sentinel lymph node biopsy is initial surgical treatment.    Procedure Detail:  Seed placement was confirmed with the neoprobe in the holding area. She underwent injection of 1 mCi of technetium sulfur colloid intradermally around the left nipple in the holding area and pectoral block by anesthesia. She was taken to the operating room, placed in the supine position on the operating table, and laryngeal mask general anesthesia induced. After patient timeout and under sterile technique I injected 5 mL of dilute methylene blue beneath the left nipple and massaged this for several minutes. Following this the entire left breast and axilla and upper arm were widely sterilely prepped and draped. Patient timeout was again performed and correct procedure verified. The lumpectomy was approached initially. The tumor was localized with the neoprobe near the tail of Spence in the upper outer left breast. I elected to use a single incision in the low axilla near the tail of Spence. An incision was made in a curvilinear fashion in the skin crease. A short skin and subcutaneous flap was raised medially over the tumor site confirmed the neoprobe. Using the neoprobe for guidance a  lumpectomy was performed with cautery around the area of high counts. An approximately 3.5 cm specimen was removed. This was inked for margins and specimen x-ray showed the see the marking clip within the specimen although somewhat closer to the inferior margin. I excised a further almost 1 cm inferior margin which was oriented and sent as an additional specimen. Following this the neoprobe was switched to technetium. However I was unable to identify any counts of any significance initially in the axilla. I confirmed that there were very high counts around the nipple. I exposed the axilla through the same incision which actually provided very broad and excellent exposure of the axillary tissue. The clavipectoral fascia was incised and I could easily explore the entire axilla through this incision. I did not see any blue dye in the upper outer breast or coursing into the axilla. With the axilla exposed and the clavipectoral fascia incised I carefully explored the entire axilla with the neoprobe and again obtained with just an occasional intermittent tiny blip. There was no palpable axillary adenopathy. I fairly extensively bluntly explored the axilla looking for any evidence of blue lymphatics or blue dye and could find none. After about 30 minutes of exploration looking for blue dye and with the neoprobe I elected to forego the axillary biopsy. I considered axillary dissection, however at age 78 with a 4 mm grade 1 cancer, ER/PR positive, I felt that axillary dissection would be too morbid for the potential benefit. The operative site was inspected for hemostasis which was complete. The lumpectomy cavity was marked with clips. The deep axillary and breast tissue and subcutaneous tissue was closed with interrupted 3-0 Vicryl and the skin closed with subcuticular 5-0 Monocryl and  Dermabond. Sponge needle and instrument counts were correct.    Findings: As above  Estimated Blood Loss:  Minimal         Drains:  none  Blood Given: none          Specimens: #1 left breast lumpectomy   #2 further inferior margin        Complications:  * No complications entered in OR log *         Disposition: PACU - hemodynamically stable.         Condition: stable

## 2016-02-23 NOTE — Progress Notes (Signed)
Assisted Dr.  Robert with left, ultrasound guided, pectoralis block. Side rails up, monitors on throughout procedure. See vital signs in flow sheet. Tolerated Procedure well.

## 2016-02-23 NOTE — Anesthesia Preprocedure Evaluation (Addendum)
Anesthesia Evaluation   Patient awake    Reviewed: Allergy & Precautions, NPO status , Patient's Chart, lab work & pertinent test results  Airway Mallampati: I  TM Distance: >3 FB Neck ROM: Full    Dental  (+) Teeth Intact   Pulmonary former smoker,    breath sounds clear to auscultation       Cardiovascular hypertension, + dysrhythmias Atrial Fibrillation  Rhythm:Regular Rate:Normal     Neuro/Psych negative neurological ROS  negative psych ROS   GI/Hepatic negative GI ROS, Neg liver ROS,   Endo/Other  negative endocrine ROS  Renal/GU negative Renal ROS  negative genitourinary   Musculoskeletal negative musculoskeletal ROS (+)   Abdominal   Peds negative pediatric ROS (+)  Hematology negative hematology ROS (+)   Anesthesia Other Findings   Reproductive/Obstetrics negative OB ROS                            Lab Results  Component Value Date   WBC 4.7 02/04/2016   HGB 14.0 02/04/2016   HCT 41.5 02/04/2016   MCV 108.3* 02/04/2016   PLT 143 Occ Large & giant platelets* 02/04/2016   Lab Results  Component Value Date   CREATININE 0.75 02/18/2016   BUN 12 02/18/2016   NA 142 02/18/2016   K 3.9 02/18/2016   CL 108 02/18/2016   CO2 23 02/18/2016   No results found for: INR, PROTIME  EKG: normal sinus rhythm.  12/2015 Echo - Left ventricle: The cavity size was normal. Wall thickness was normal. Systolic function was normal. The estimated ejection fraction was in the range of 60% to 65%. Left ventricular diastolic function parameters were normal. - Mitral valve: MV is thickened, difficult to see well Cannot exclude some prolapse. MR appears more moderate, some coursing posterior into LA. There was moderate regurgitation. - Left atrium: The atrium was severely dilated.     Anesthesia Physical Anesthesia Plan  ASA: III  Anesthesia Plan: General   Post-op Pain Management:  GA combined w/ Regional for post-op pain   Induction: Intravenous  Airway Management Planned: LMA  Additional Equipment:   Intra-op Plan:   Post-operative Plan: Extubation in OR  Informed Consent: I have reviewed the patients History and Physical, chart, labs and discussed the procedure including the risks, benefits and alternatives for the proposed anesthesia with the patient or authorized representative who has indicated his/her understanding and acceptance.   Dental advisory given  Plan Discussed with: CRNA  Anesthesia Plan Comments:         Anesthesia Quick Evaluation

## 2016-02-23 NOTE — Transfer of Care (Signed)
Immediate Anesthesia Transfer of Care Note  Patient: Michele Green  Procedure(s) Performed: Procedure(s): BREAST LUMPECTOMY WITH RADIOACTIVE SEED LOCALIZATION (Left)  Patient Location: PACU  Anesthesia Type:GA combined with regional for post-op pain  Level of Consciousness: awake and patient cooperative  Airway & Oxygen Therapy: Patient Spontanous Breathing and Patient connected to face mask oxygen  Post-op Assessment: Report given to RN and Post -op Vital signs reviewed and stable  Post vital signs: Reviewed and stable  Last Vitals:  Filed Vitals:   02/23/16 1450 02/23/16 1451  BP: 126/72   Pulse:  79  Temp:  36.6 C  Resp:  18    Complications: No apparent anesthesia complications

## 2016-02-23 NOTE — Anesthesia Procedure Notes (Addendum)
Anesthesia Regional Block:  Pectoralis block  Pre-Anesthetic Checklist: ,, timeout performed, Correct Patient, Correct Site, Correct Laterality, Correct Procedure, Correct Position, site marked, Risks and benefits discussed,  Surgical consent,  Pre-op evaluation,  At surgeon's request and post-op pain management  Laterality: Left  Prep: chloraprep       Needles:  Injection technique: Single-shot  Needle Type: Echogenic Needle     Needle Length: 9cm 9 cm Needle Gauge: 21 and 21 G    Additional Needles:  Procedures: ultrasound guided (picture in chart) Pectoralis block Narrative:  Start time: 02/23/2016 12:37 PM End time: 02/23/2016 12:39 PM Injection made incrementally with aspirations every 5 mL.  Performed by: Personally  Anesthesiologist: Suella Broad D  Additional Notes: No immediate complications noted. Pt tolerated well.    Procedure Name: LMA Insertion Date/Time: 02/23/2016 1:30 PM Performed by: Lyndee Leo Pre-anesthesia Checklist: Patient identified, Emergency Drugs available, Suction available and Patient being monitored Patient Re-evaluated:Patient Re-evaluated prior to inductionOxygen Delivery Method: Circle System Utilized Preoxygenation: Pre-oxygenation with 100% oxygen Intubation Type: IV induction Ventilation: Mask ventilation without difficulty LMA: LMA inserted LMA Size: 4.0 Number of attempts: 1 Airway Equipment and Method: Bite block Placement Confirmation: positive ETCO2 Tube secured with: Tape Dental Injury: Teeth and Oropharynx as per pre-operative assessment

## 2016-02-23 NOTE — Anesthesia Postprocedure Evaluation (Signed)
Anesthesia Post Note  Patient: Michele Green  Procedure(s) Performed: Procedure(s) (LRB): BREAST LUMPECTOMY WITH RADIOACTIVE SEED LOCALIZATION (Left)  Patient location during evaluation: PACU Anesthesia Type: General and Regional Level of consciousness: awake and alert Pain management: pain level controlled Vital Signs Assessment: post-procedure vital signs reviewed and stable Respiratory status: spontaneous breathing, nonlabored ventilation, respiratory function stable and patient connected to nasal cannula oxygen Cardiovascular status: blood pressure returned to baseline and stable Postop Assessment: no signs of nausea or vomiting Anesthetic complications: no    Last Vitals:  Filed Vitals:   02/23/16 1501 02/23/16 1516  BP: 102/91 144/78  Pulse: 82   Temp:    Resp: 18 11    Last Pain: There were no vitals filed for this visit.               Effie Berkshire

## 2016-02-23 NOTE — Discharge Instructions (Signed)
[facility] °Office Phone Number [phone] ° °BREAST BIOPSY/ PARTIAL MASTECTOMY: POST OP INSTRUCTIONS ° °Always review your discharge instruction sheet given to you by the facility where your surgery was performed. ° °IF YOU HAVE DISABILITY OR FAMILY LEAVE FORMS, YOU MUST BRING THEM TO THE OFFICE FOR PROCESSING.  DO NOT GIVE THEM TO YOUR DOCTOR. ° °1. A prescription for pain medication may be given to you upon discharge.  Take your pain medication as prescribed, if needed.  If narcotic pain medicine is not needed, then you may take acetaminophen (Tylenol) or ibuprofen (Advil) as needed. °2. Take your usually prescribed medications unless otherwise directed °3. If you need a refill on your pain medication, please contact your pharmacy.  They will contact our office to request authorization.  Prescriptions will not be filled after 5pm or on week-ends. °4. You should eat very light the first 24 hours after surgery, such as soup, crackers, pudding, etc.  Resume your normal diet the day after surgery. °5. Most patients will experience some swelling and bruising in the breast.  Ice packs and a good support bra will help.  Swelling and bruising can take several days to resolve.  °6. It is common to experience some constipation if taking pain medication after surgery.  Increasing fluid intake and taking a stool softener will usually help or prevent this problem from occurring.  A mild laxative (Milk of Magnesia or Miralax) should be taken according to package directions if there are no bowel movements after 48 hours. °7. Unless discharge instructions indicate otherwise, you may remove your bandages 24-48 hours after surgery, and you may shower at that time.  You may have steri-strips (small skin tapes) in place directly over the incision.  These strips should be left on the skin for 7-10 days.  If your surgeon used skin glue on the incision, you may shower in 24 hours.  The glue will flake off over the  next 2-3 weeks.  Any sutures or staples will be removed at the office during your follow-up visit. °8. ACTIVITIES:  You may resume regular daily activities (gradually increasing) beginning the next day.  Wearing a good support bra or sports bra minimizes pain and swelling.  You may have sexual intercourse when it is comfortable. °a. You may drive when you no longer are taking prescription pain medication, you can comfortably wear a seatbelt, and you can safely maneuver your car and apply brakes. °b. RETURN TO WORK:  ______________________________________________________________________________________ °9. You should see your doctor in the office for a follow-up appointment approximately two weeks after your surgery.  Your doctor’s nurse will typically make your follow-up appointment when she calls you with your pathology report.  Expect your pathology report 2-3 business days after your surgery.  You may call to check if you do not hear from us after three days. °10. OTHER INSTRUCTIONS: _______________________________________________________________________________________________ _____________________________________________________________________________________________________________________________________ °_____________________________________________________________________________________________________________________________________ °_____________________________________________________________________________________________________________________________________ ° °WHEN TO CALL YOUR DOCTOR: °1. Fever over 101.0 °2. Nausea and/or vomiting. °3. Extreme swelling or bruising. °4. Continued bleeding from incision. °5. Increased pain, redness, or drainage from the incision. ° °The clinic staff is available to answer your questions during regular business hours.  Please don’t hesitate to call and ask to speak to one of the nurses for clinical concerns.  If you have a medical emergency, go to the nearest  emergency room or call 911.  A surgeon from [facility] is always on call at the hospital. ° °For further questions, please visit [email or web address]  ° ° ° °  Post Anesthesia Home Care Instructions ° °Activity: °Get plenty of rest for the remainder of the day. A responsible adult should stay with you for 24 hours following the procedure.  °For the next 24 hours, DO NOT: °-Drive a car °-Operate machinery °-Drink alcoholic beverages °-Take any medication unless instructed by your physician °-Make any legal decisions or sign important papers. ° °Meals: °Start with liquid foods such as gelatin or soup. Progress to regular foods as tolerated. Avoid greasy, spicy, heavy foods. If nausea and/or vomiting occur, drink only clear liquids until the nausea and/or vomiting subsides. Call your physician if vomiting continues. ° °Special Instructions/Symptoms: °Your throat may feel dry or sore from the anesthesia or the breathing tube placed in your throat during surgery. If this causes discomfort, gargle with warm salt water. The discomfort should disappear within 24 hours. ° °If you had a scopolamine patch placed behind your ear for the management of post- operative nausea and/or vomiting: ° °1. The medication in the patch is effective for 72 hours, after which it should be removed.  Wrap patch in a tissue and discard in the trash. Wash hands thoroughly with soap and water. °2. You may remove the patch earlier than 72 hours if you experience unpleasant side effects which may include dry mouth, dizziness or visual disturbances. °3. Avoid touching the patch. Wash your hands with soap and water after contact with the patch. °  ° °

## 2016-02-23 NOTE — Interval H&P Note (Signed)
History and Physical Interval Note:  02/23/2016 1:09 PM  Michele Green  has presented today for surgery, with the diagnosis of LEFT BREAST CANCER  The various methods of treatment have been discussed with the patient and family. After consideration of risks, benefits and other options for treatment, the patient has consented to  Procedure(s): RADIOACTIVE SEED GUIDED PARTIAL MASTECTOMY WITH AXILLARY SENTINEL LYMPH NODE BIOPSY (Left) as a surgical intervention .  The patient's history has been reviewed, patient examined, no change in status, stable for surgery.  I have reviewed the patient's chart and labs.  Questions were answered to the patient's satisfaction.     Jakhari Space T

## 2016-02-23 NOTE — OR Nursing (Signed)
Unable to complete Axillary Sentinel Lymph Node Biopsy due to radioactive injection and methylene blue injection not transferring to axillary cavity.  See Operative Report.

## 2016-02-24 ENCOUNTER — Encounter (HOSPITAL_BASED_OUTPATIENT_CLINIC_OR_DEPARTMENT_OTHER): Payer: Self-pay | Admitting: General Surgery

## 2016-03-03 ENCOUNTER — Other Ambulatory Visit: Payer: Medicare Other

## 2016-03-03 ENCOUNTER — Ambulatory Visit (HOSPITAL_BASED_OUTPATIENT_CLINIC_OR_DEPARTMENT_OTHER): Payer: Medicare Other | Admitting: Genetic Counselor

## 2016-03-03 ENCOUNTER — Encounter: Payer: Self-pay | Admitting: Interventional Cardiology

## 2016-03-03 ENCOUNTER — Encounter: Payer: Self-pay | Admitting: Genetic Counselor

## 2016-03-03 DIAGNOSIS — Z315 Encounter for genetic counseling: Secondary | ICD-10-CM | POA: Diagnosis not present

## 2016-03-03 DIAGNOSIS — C50412 Malignant neoplasm of upper-outer quadrant of left female breast: Secondary | ICD-10-CM

## 2016-03-03 DIAGNOSIS — Z803 Family history of malignant neoplasm of breast: Secondary | ICD-10-CM | POA: Diagnosis not present

## 2016-03-03 NOTE — Progress Notes (Signed)
REFERRING PROVIDER: [name], MD [address], [place] [address]   Truitt Merle, MD  PRIMARY PROVIDER:  [name], MD  PRIMARY REASON FOR VISIT:  1. Breast cancer of upper-outer quadrant of left female breast ([place])   2. Family history of breast cancer      HISTORY OF PRESENT ILLNESS:   Michele Green, a 75 y.o. female, was seen for a [facility] cancer genetics consultation at the request of Dr. Burr Medico due to a personal and family history of cancer.  Michele Green presents to clinic today to discuss the possibility of a hereditary predisposition to cancer, genetic testing, and to further clarify her future cancer risks, as well as potential cancer risks for family members.   In 2017, at the age of 22, Ms. 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 This was treated with lumpectomy.   CANCER HISTORY:  Oncology History   Breast cancer of upper-outer quadrant of left female breast [facility])   Staging form: Breast, AJCC 7th Edition     Clinical stage from 01/28/2016: Stage IA (T1a, N0, M0) - Signed by Truitt Merle, MD on 02/03/2016        Breast cancer of upper-outer quadrant of left female breast ([place])   01/14/2016 Mammogram Screening mammogram showed possible mass with distortion in the left breast.   01/23/2016 Imaging Diagnostic mammogram and ultrasound of the left breast showed a 3-4 mm shadowing mass at 1:00 in the left breast, no adenopathy.   01/28/2016 Initial Diagnosis Breast cancer of upper-outer quadrant of left female breast ([place])   01/28/2016 Initial Biopsy Left breast mass biopsy showed invasive ductal carcinoma, grade 1.   01/28/2016 Receptors her2 Your 100% positive, PR 100% positive, HER-2 negative, Ki-67 5%     HORMONAL RISK FACTORS:  Menarche was at age 26.  First live birth at age 87.  OCP use for approximately 5 years.  Ovaries intact: yes.  Hysterectomy: no.  Menopausal status: postmenopausal.  HRT use: 5  years. Colonoscopy: yes; polyps.  Screened every 5 years.. Mammogram within the last year: yes. Number of breast biopsies: 1. Up to date with pelvic exams:  yes. Any excessive radiation exposure in the past:  no  Past Medical History  Diagnosis Date  . Osteopenia   . Hypertension   . Colon polyp 02/2012  . Colitis   . Dupuytren contracture     bilateral hands  . Wears glasses   . Persistent atrial fibrillation ([place]) 08/22/2015    Started late August or early September.   . On continuous oral anticoagulation 08/22/2015    Started on Xarelto 08/12/2015   . Dysrhythmia   . Breast cancer of upper-outer quadrant of left female breast ([place]) 02/02/2016  . Breast cancer ([place])   . Family history of breast cancer     Past Surgical History  Procedure Laterality Date  . Dupuytren contracture release  2001    leftx2  . Tonsillectomy  age 69  . Dupuytren contracture release  1990    right  . Colonoscopy    . Dupuytren contracture release Right 05/02/2014    Procedure: EXCISION DUPUYTRENS RIGHT PALMAR/SMALL ;  Surgeon: Cammie Sickle, MD;  Location: [facility];  Service: Orthopedics;  Laterality: Right;  . Cardioversion N/A 10/02/2015    Procedure: CARDIOVERSION;  Surgeon: Jerline Pain, MD;  Location: [facility] ENDOSCOPY;  Service: Cardiovascular;  Laterality: N/A;  . Breast lumpectomy with radioactive seed  localization Left 02/23/2016    Procedure: BREAST LUMPECTOMY WITH RADIOACTIVE SEED LOCALIZATION;  Surgeon: [name], MD;  Location: [facility];  Service: General;  Laterality: Left;    Social History   Social History  . Marital Status: Married    Spouse Name: N/A  . Number of Children: 1  . Years of Education: N/A   Social History Main Topics  . Smoking status: Former Smoker -- 1.00 packs/day for 20 years    Types: Cigarettes    Quit date: 11/22/1992  . Smokeless tobacco: Never Used  . Alcohol Use: 6.0 oz/week    7 Glasses of wine, 3 Standard  drinks or equivalent per week  . Drug Use: No  . Sexual Activity: No   Other Topics Concern  . None   Social History Narrative     FAMILY HISTORY:  We obtained a detailed, 4-generation family history.  Significant diagnoses are listed below: Family History  Problem Relation Age of Onset  . Cirrhosis Mother   . Breast cancer Mother 8  . Heart failure Father   . Breast cancer Sister 65  . Kidney Stones Sister     loss of kidney due to stones  . Heart attack Father     The patient has one son who is cancer free.  She has two sisters, one who had breast cancer at age 96.  The patient's mother was diagnosed with breast cancer at age 19 and died at 71.  The patient's father died at 29.  Her mother had one sister and three brothers who were all cancer free.  Her father had two sisters and five brothers who were all cancer free.  There is no other reported family history of cancer.  Patient's maternal ancestors are of [place] descent, and paternal ancestors are of Caucasian descent. There is no reported Ashkenazi Jewish ancestry. There is no known consanguinity.  GENETIC COUNSELING ASSESSMENT: [name] is a 75 y.o. female with a personal and family history of breast cancer which is somewhat suggestive of a hereditary cancer syndrome and predisposition to cancer. We, therefore, discussed and recommended the following at today's visit.   DISCUSSION: We discussed that about 5-10% of breast cancer is hereditary with most cases due to BRCA mutations.  Other genes can also increase the risk for hereditary breast cancer including PALB2, ATM, and CHEK2.  We discussed that while she meets criteria, the chance that she will be positive is low.  This is based on the ages of diagnosis of cancer in herself as well as her sister and mother.  We reviewed the characteristics, features and inheritance patterns of hereditary cancer syndromes. We also discussed genetic testing, including the appropriate  family members to test, the process of testing, insurance coverage and turn-around-time for results. We discussed the implications of a negative, positive and/or variant of uncertain significant result. We recommended Ms. Printy pursue genetic testing for the Breast/Ovarian cancer gene panel. The Breast/Ovarian gene panel offered by [facility] includes sequencing and rearrangement analysis for the following 20 genes:  ATM, BARD1, BRCA1, BRCA2, BRIP1, CDH1, CHEK2, EPCAM, FANCC, MLH1, MSH2, MSH6, NBN, PALB2, PMS2, PTEN, RAD51C, RAD51D, TP53, and XRCC2.     Based on Ms. Ferrucci's personal and family history of cancer, she meets medical criteria for genetic testing. Despite that she meets criteria, she may still have an out of pocket cost. We discussed that if her out of pocket cost for testing is over $100, the laboratory will call and  confirm whether she wants to proceed with testing.  If the out of pocket cost of testing is less than $100 she will be billed by the genetic testing laboratory.   PLAN: After considering the risks, benefits, and limitations, Ms. Krise  provided informed consent to pursue genetic testing and the blood sample was sent to [facility] for analysis of the Breast/Ovarian cancer panel. Results should be available within approximately 2-3 weeks' time, at which point they will be disclosed by telephone to Ms. Manard, as will any additional recommendations warranted by these results. Ms. Andujar will receive a summary of her genetic counseling visit and a copy of her results once available. This information will also be available in Epic. We encouraged Ms. Dellis to remain in contact with cancer genetics annually so that we can continuously update the family history and inform her of any changes in cancer genetics and testing that may be of benefit for her family. Ms. Martin questions were answered to her satisfaction today. Our contact information was provided should additional questions or  concerns arise.  Lastly, we encouraged Ms. Huneycutt to remain in contact with cancer genetics annually so that we can continuously update the family history and inform her of any changes in cancer genetics and testing that may be of benefit for this family.   Ms.  Knipp questions were answered to her satisfaction today. Our contact information was provided should additional questions or concerns arise. Thank you for the referral and allowing [place] to share in the care of your patient.   Almas Rake P. Florene Glen, [place], [facility] Certified Genetic Counselor [email or web address] phone: [phone]  The patient was seen for a total of 60 minutes in face-to-face genetic counseling.  This patient was discussed with Drs. Magrinat, Lindi Adie and/or Burr Medico who agrees with the above.    _______________________________________________________________________ For Office Staff:  Number of people involved in session: 1 Was an Intern/ student involved with case: yes

## 2016-03-11 ENCOUNTER — Ambulatory Visit (HOSPITAL_BASED_OUTPATIENT_CLINIC_OR_DEPARTMENT_OTHER): Payer: Medicare Other | Admitting: Hematology

## 2016-03-11 ENCOUNTER — Telehealth: Payer: Self-pay | Admitting: Hematology

## 2016-03-11 ENCOUNTER — Encounter: Payer: Self-pay | Admitting: Hematology

## 2016-03-11 VITALS — BP 144/76 | HR 76 | Temp 98.2°F | Resp 17 | Ht 64.0 in | Wt 142.6 lb

## 2016-03-11 DIAGNOSIS — C50412 Malignant neoplasm of upper-outer quadrant of left female breast: Secondary | ICD-10-CM | POA: Diagnosis not present

## 2016-03-11 DIAGNOSIS — Z803 Family history of malignant neoplasm of breast: Secondary | ICD-10-CM

## 2016-03-11 DIAGNOSIS — Z170 Estrogen receptor positive status [ER+]: Secondary | ICD-10-CM

## 2016-03-11 DIAGNOSIS — I4891 Unspecified atrial fibrillation: Secondary | ICD-10-CM | POA: Diagnosis not present

## 2016-03-11 DIAGNOSIS — I10 Essential (primary) hypertension: Secondary | ICD-10-CM

## 2016-03-11 DIAGNOSIS — I340 Nonrheumatic mitral (valve) insufficiency: Secondary | ICD-10-CM

## 2016-03-11 NOTE — Telephone Encounter (Signed)
per pof to sch pt appt-gave pt copy of avs °

## 2016-03-11 NOTE — Progress Notes (Signed)
Michele Green  Telephone:([phone]) [phone] Fax:([phone]) [phone]  Clinic Follow Up Note   Patient Care Team: [name], MD as PCP - General (Family Medicine) 03/11/2016  CHIEF COMPLAINTS:  Follow up  left breast cancer  Oncology History   Breast cancer of upper-outer quadrant of left female breast [facility])   Staging form: Breast, AJCC 7th Edition     Clinical stage from 01/28/2016: Stage IA (T1a, N0, M0) - Signed by Truitt Merle, MD on 02/03/2016        Breast cancer of upper-outer quadrant of left female breast ([place])   01/14/2016 Mammogram Screening mammogram showed possible mass with distortion in the left breast.   01/23/2016 Imaging Diagnostic mammogram and ultrasound of the left breast showed a 3-4 mm shadowing mass at 1:00 in the left breast, no adenopathy.   01/28/2016 Initial Diagnosis Breast cancer of upper-outer quadrant of left female breast ([place])   01/28/2016 Initial Biopsy Left breast mass biopsy showed invasive ductal carcinoma, grade 1.   01/28/2016 Receptors her2 Your 100% positive, PR 100% positive, HER-2 negative, Ki-67 5%   02/23/2016 Surgery Left breast lumpectomy, and sentinel lymph node biopsy   02/23/2016 Pathology Results Left breast lumpectomy showed a 1.0 cm invasive ductal carcinoma, grade 1, no lymphvascular invasion, invasive carcinoma is broadly less than 0.1 cm to the anterior inferior margin, ADH, ALH, 1 node(-). Left inferior reexcision margin showed LCIS      HISTORY OF PRESENTING ILLNESS:  Michele Green 75 y.o. female is here because of newly diagnosed left breast cancer. She is accompanied by her husband to our multidisciplinary rest clinic today.  This was discovered by screening mammo, she denies any palpable breast mass, skin change or nipple discharge, she feels well, no complains. She remains to be physically active.   She  was found to have AF and had cardioverstion in 09/2015,  she has been on amiodarone and Xarelto since then. She also has had  colitis 2-3 yeears, on liada,  she follows up with gastroenterologist Dr. Amedeo Plenty.  GYN HISTORY  Menarchal: 12 LMP: 48 Contraceptive: 8 HRT: 5 years, stopped when she was 18 G1P1: no breast feeding      CURRENTLY THERAPY: pending adjuvant breast radiation   INTERIM HISTORY: [name] returns for follow-up. She is accompanied by her sister to clinic today. She underwent left breast lumpectomy and sentinel lymph node biopsy on 02/23/2016. She tolerated the surgery very well overall, still has mild pain at the incision site, no need pain medication. No breast or arm swelling. She is otherwise doing very well.    MEDICAL HISTORY:  Past Medical History  Diagnosis Date  . Osteopenia   . Hypertension   . Colon polyp 02/2012  . Colitis   . Dupuytren contracture     bilateral hands  . Wears glasses   . Persistent atrial fibrillation ([place]) 08/22/2015    Started late August or early September.   . On continuous oral anticoagulation 08/22/2015    Started on Xarelto 08/12/2015   . Dysrhythmia   . Breast cancer of upper-outer quadrant of left female breast ([place]) 02/02/2016  . Breast cancer ([place])   . Family history of breast cancer     SURGICAL HISTORY: Past Surgical History  Procedure Laterality Date  . Dupuytren contracture release  2001    leftx2  . Tonsillectomy  age 45  . Dupuytren contracture release  1990    right  . Colonoscopy    . Dupuytren contracture release Right 05/02/2014  Procedure: EXCISION DUPUYTRENS RIGHT PALMAR/SMALL ;  Surgeon: Cammie Sickle, MD;  Location: [place];  Service: Orthopedics;  Laterality: Right;  . Cardioversion N/A 10/02/2015    Procedure: CARDIOVERSION;  Surgeon: Jerline Pain, MD;  Location[facility] ENDOSCOPY;  Service: Cardiovascular;  Laterality: N/A;  . Breast lumpectomy with radioactive seed localization Left 02/23/2016    Procedure: BREAST LUMPECTOMY WITH RADIOACTIVE SEED LOCALIZATION;  Surgeon: [name], MD;  Location:  [place];  Service: General;  Laterality: Left;    SOCIAL HISTORY: Social History   Social History  . Marital Status: Married    Spouse Name: N/A  . Number of Children: 1  . Years of Education: N/A   Occupational History  . Not on file.   Social History Main Topics  . Smoking status: Former Smoker -- 1.00 packs/day for 20 years    Types: Cigarettes    Quit date: 11/22/1992  . Smokeless tobacco: Never Used  . Alcohol Use: 6.0 oz/week    7 Glasses of wine, 3 Standard drinks or equivalent per week  . Drug Use: No  . Sexual Activity: No   Other Topics Concern  . Not on file   Social History Narrative    FAMILY HISTORY: Family History  Problem Relation Age of Onset  . Cirrhosis Mother   . Breast cancer Mother 35  . Heart failure Father   . Breast cancer Sister 33  . Kidney Stones Sister     loss of kidney due to stones  . Heart attack Father     ALLERGIES:  has No Known Allergies.  MEDICATIONS:  Current Outpatient Prescriptions  Medication Sig Dispense Refill  . allopurinol (ZYLOPRIM) 100 MG tablet Take 100 mg by mouth daily.     [name] amiodarone (PACERONE) 200 MG tablet Take 1 tablet (200 mg total) by mouth daily. 90 tablet 3  . CALCIUM PO Take 1 tablet by mouth daily. Reported on 02/04/2016    . ergocalciferol (VITAMIN D2) 50000 UNITS capsule Take 50,000 Units by mouth once a week. Reported on 02/04/2016    . HYDROcodone-acetaminophen (NORCO/VICODIN) 5-325 MG tablet Take 1-2 tablets by mouth every 4 (four) hours as needed for moderate pain or severe pain. 30 tablet 0  . LIALDA 1.2 G EC tablet Take 1.2 g by mouth 3 (three) times daily.     . rivaroxaban (XARELTO) 20 MG TABS tablet Take 1 tablet (20 mg total) by mouth daily with supper. 90 tablet 3  . simvastatin (ZOCOR) 20 MG tablet Take 20 mg by mouth daily.      No current facility-administered medications for this visit.    REVIEW OF SYSTEMS:   Constitutional: Denies fevers, chills or abnormal  night sweats Eyes: Denies blurriness of vision, double vision or watery eyes Ears, nose, mouth, throat, and face: Denies mucositis or sore throat Respiratory: Denies cough, dyspnea or wheezes Cardiovascular: Denies palpitation, chest discomfort or lower extremity swelling Gastrointestinal:  Denies nausea, heartburn or change in bowel habits Skin: Denies abnormal skin rashes Lymphatics: Denies new lymphadenopathy or easy bruising Neurological:Denies numbness, tingling or new weaknesses Behavioral/Psych: Mood is stable, no new changes  All other systems were reviewed with the patient and are negative.  PHYSICAL EXAMINATION: ECOG PERFORMANCE STATUS: 0 - Asymptomatic  Filed Vitals:   03/11/16 1605  BP: 144/76  Pulse: 76  Temp: 98.2 F (36.8 C)  Resp: 17   Filed Weights   03/11/16 1605  Weight: 142 lb 9.6 oz (64.683 kg)  GENERAL:alert, no distress and comfortable SKIN: skin color, texture, turgor are normal, no rashes or significant lesions EYES: normal, conjunctiva are pink and non-injected, sclera clear OROPHARYNX:no exudate, no erythema and lips, buccal mucosa, and tongue normal  NECK: supple, thyroid normal size, non-tender, without nodularity LYMPH:  no palpable lymphadenopathy in the cervical, axillary or inguinal LUNGS: clear to auscultation and percussion with normal breathing effort HEART: regular rate & rhythm and no murmurs and no lower extremity edema ABDOMEN:abdomen soft, non-tender and normal bowel sounds Musculoskeletal:no cyanosis of digits and no clubbing  PSYCH: alert & oriented x 3 with fluent speech NEURO: no focal motor/sensory deficits Breasts: Breast inspection showed them to be symmetrical with no nipple discharge.  Incision in the left breast is healing very well. There is a lump next to the incision, likely a seroma. Palpation of the breasts and axilla revealed no obvious mass that I could appreciate.   LABORATORY DATA:  I have reviewed the data as  listed Lab Results  Component Value Date   WBC 4.7 02/04/2016   HGB 14.0 02/04/2016   HCT 41.5 02/04/2016   MCV 108.3* 02/04/2016   PLT 143 Occ Large & giant platelets* 02/04/2016    Recent Labs  09/29/15 1100 02/04/16 1221 02/18/16 1225  NA 141 143 142  K 4.1 3.9 3.9  CL 106  --  108  CO2 23 27 23   GLUCOSE 92 98 94  BUN 11 14.0 12  CREATININE 0.82 0.8 0.75  CALCIUM 8.9 9.4 9.3  GFRNONAA  --   --  >60  GFRAA  --   --  >60  PROT  --  7.3  --   ALBUMIN  --  3.7  --   AST  --  44*  --   ALT  --  37  --   ALKPHOS  --  85  --   BILITOT  --  0.86  --    PATHOLOGY REPORT  Diagnosis 02/23/2016 1. Breast, lumpectomy, left - INVASIVE DUCTAL CARCINOMA, GRADE I/III, SPANNING 1.0 CM. - INVASIVE CARCINOMA IS BROADLY LESS THAN 0.1 CM TO THE ANTERIOR/INFERIOR MARGIN OF SPECIMEN #1. - ATYPICAL DUCTAL HYPERPLASIA. - LOBULAR NEOPLASIA (ATYPICAL LOBULAR HYPERPLASIA) - ONE BENIGN LYMPH NODE (0/1). - SEE ONCOLOGY TABLE BELOW. 2. Breast, excision, left inferior - LOBULAR NEOPLASIA (LOBULAR CARCINOMA IN SITU). - SEE COMMENT. 1. 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 BREAST, INVASIVE TUMOR, WITH LYMPH NODES PRESENT Specimen, including laterality and lymph node sampling (sentinel, non-sentinel): Left breast. Procedure: Seed localized lumpectomy and additional inferior margin resection. Histologic type: Ductal. Grade: 1 Tubule formation: 1 Nuclear pleomorphism: 2 Mitotic:1 Tumor size (gross measurement): 1.0 cm Margins: Invasive, distance to closest margin: Broadly less than 0.1 cm to the anterior/inferior margin of specimen #1, see comment. Lymphovascular invasion: Not identified. Ductal carcinoma in situ: Not identified. Lobular neoplasia: Present, lobular carcinoma in situ. Tumor focality: Unifocal. Treatment effect: N/A Extent of tumor: Confined to breast parenchyma. Lymph nodes: Examined: 0 Sentinel,  1 Non-sentinel 1 Total Lymph nodes with metastasis:  0 Breast prognostic profile: Case [id number] Estrogen receptor: 100%, strong staining intensity Progesterone receptor: 100%, strong staining intensity Her 2 neu: No amplification was detected. The ratio was 1.35. Her 2 neu by FISH will be repeated on the current case (block 1A) and the results reported separately. Ki-67: 5% Non-neoplastic breast: Fibrocystic changes with calcifications and healing biopsy site TNM: pT1b, pN0 Comments: Immunohistochemical stains for smooth muscle myosin, calponin, and p63 support the above diagnosis. Although the invasive  carcinoma is broadly less than 0.1 cm to the anterior/inferior margin of specimen #1, the separately submitted inferior margin (specimen #2) is negative. 2. The surgical resection margin(s) of the specimen were inked and microscopically evaluated. (JBK:ecj 02/27/2016) Enid Cutter MD Pathologist, Electronic Signature (Case signed 02/27/2016)  Results: HER2 - NEGATIVE RATIO OF HER2/CEP17 SIGNALS 1.54 AVERAGE HER2 COPY NUMBER PER CELL 2.00 RADIOGRAPHIC STUDIES: I have personally reviewed the radiological images as listed and agreed with the findings in the report.   Mm Diag Breast Tomo Uni Left and [place] 01/23/2016  01/23/2016  CLINICAL DATA:  76 year old female presenting for screening recall of left breast distortion. EXAM: DIGITAL DIAGNOSTIC LEFT MAMMOGRAM WITH 3D TOMOSYNTHESIS AND CAD LEFT BREAST ULTRASOUND COMPARISON:  Previous exam(s). ACR Breast Density Category c: The breast tissue is heterogeneously dense, which may obscure small masses. FINDINGS: There is a persistent distortion in the upper-outer quadrant of the left breast in the posterior depth with a possible tiny associated mass. Mammographic images were processed with CAD. Physical exam of the upper-outer quadrant of the left breast demonstrates a small firm palpable lump at 1 o'clock, 4 cm from the nipple. Ultrasound targeted to the left breast at 1 o'clock, 4 cm from the nipple at  the site of the palpable lump, there is a tiny 3-4 mm shadowing mass with possible internal blood flow on color Doppler imaging. Ultrasound of the left axilla demonstrates multiple normal-appearing lymph nodes. IMPRESSION: 1. A 3-4 mm shadowing mass is identified at 1 o'clock in the left breast, likely corresponding with the area of distortion on the mammogram. 2.  No evidence of left axillary lymphadenopathy. RECOMMENDATION: Ultrasound-guided biopsy is recommended for the shadowing mass in the left breast at 1 o'clock. This has been scheduled for 01/28/2016 at 2 p.m. I have discussed the findings and recommendations with the patient. Results were also provided in writing at the conclusion of the visit. If applicable, a reminder letter will be sent to the patient regarding the next appointment. BI-RADS CATEGORY  4: Suspicious. Electronically Signed   By: Ammie Ferrier M.D.   On: 01/23/2016 11:40   Mm Screening Breast Tomo Bilateral  01/14/2016  CLINICAL DATA:  Screening. EXAM: DIGITAL SCREENING BILATERAL MAMMOGRAM WITH 3D TOMO WITH CAD COMPARISON:  Previous exam(s). ACR Breast Density Category c: The breast tissue is heterogeneously dense, which may obscure small masses. FINDINGS: In the left breast, possible mass with distortion warrants further evaluation. In the right breast, no findings suspicious for malignancy. Images were processed with CAD. IMPRESSION: Further evaluation is suggested for possible mass with distortion in the left breast. RECOMMENDATION: Diagnostic mammogram and possibly ultrasound of the left breast. (Code:[id number]) The patient will be contacted regarding the findings, and additional imaging will be scheduled. BI-RADS CATEGORY  0: Incomplete. Need additional imaging evaluation and/or prior mammograms for comparison. Electronically Signed   By: Lajean Manes M.D.   On: 01/14/2016 11:36     ASSESSMENT & PLAN:  75 year-old postmenopausal Caucasian woman, presented with screening this  covered left breast cancer.  1. Breast cancer of upper-outer quadrant of left female breast, G1 invasive ductal carcinoma, pT1bN0M0, stage IA, ER+/PR+/HER2- -I reviewed her surgical pathology findings with patient in details. -She has stage I a disease, low grade, likely will do very well with stroke risk of recurrence. -We discussed the risk of cancer recurrence after complete surgical resection. Giving the very small size of the tumor, low-grade, low Ki-67, ER PR positive HER-2 negative disease, the risk of recurrence is  quite low. I do not recommend adjuvant chemotherapy. -Given the strong ER and PR positivity, I recommend pt to consider adjuvant aromatase inhibitor to reduce her risk of cancer recurrence. She does have atrial fibrillation, and mitral regurgitation, I discussed the slightly increased risk of cardiovascular disease from aromatase inhibitor, in addition to the common side effects of hot flash, musculoskeletal discomfort, osteopenia and osteoporosis, etc. Patient is interested, and will also discuss with her cardiologist about that. I plan to start her on Arimidex after she completes adjuvant radiation. -she is scheduled to see Dr. Sondra Come back in a few weeks, to start adjuvant irradiation -We also discussed breast cancer surveillance, including annual screening mammogram, self exam, routine follow-up with exam by [place].  2. HTN, AF, mitral regurgitation  -She'll continue follow-up with her primary care physician and cardiologist.  3. Genetics -Given her positive family history of breast cancer in her mother and a sister, we will refer her to genetic counseling to ruled out inheritable breast cancer syndrome. She is interested, we'll set up her appointment.  Plan -she will see Dr. Sondra Come in a few weeks, and likely start breast adjuvant radiation next months -I'll see her back in 2 months, when she completes radiation, to start her on anastrozole.  All questions were answered. The  patient knows to call the clinic with any problems, questions or concerns. I spent 25 minutes counseling the patient face to face. The total time spent in the appointment was 30 minutes and more than 50% was on counseling.     Truitt Merle, MD 03/11/2016 6:06 PM

## 2016-03-17 ENCOUNTER — Encounter: Payer: Self-pay | Admitting: Genetic Counselor

## 2016-03-17 DIAGNOSIS — Z1379 Encounter for other screening for genetic and chromosomal anomalies: Secondary | ICD-10-CM | POA: Insufficient documentation

## 2016-03-17 NOTE — Progress Notes (Signed)
Location of Breast Cancer:Breast cancer of upper-outer quadrant of left female breast    Histology per Pathology Report:   02/23/16 Diagnosis 1. Breast, lumpectomy, left - INVASIVE DUCTAL CARCINOMA, GRADE I/III, SPANNING 1.0 CM. - INVASIVE CARCINOMA IS BROADLY LESS THAN 0.1 CM TO THE ANTERIOR/INFERIOR MARGIN OF SPECIMEN #1. - ATYPICAL DUCTAL HYPERPLASIA. - LOBULAR NEOPLASIA (ATYPICAL LOBULAR HYPERPLASIA) - ONE BENIGN LYMPH NODE (0/1). - SEE ONCOLOGY TABLE BELOW. 2. Breast, excision, left inferior - LOBULAR NEOPLASIA (LOBULAR CARCINOMA IN SITU). - SEE COMMENT.  01/28/16 Diagnosis Breast, left, needle core biopsy, 1:00 o'clock, 4 CMFN - INVASIVE DUCTAL CARCINOMA.  Receptor Status: ER(100%), PR (100%), Her2-neu (negative)  Did patient present with symptoms (if so, please note symptoms) or was this found on screening mammography?: abnormal mammogram  Past/Anticipated interventions by surgeon, if any: 02/23/16 - Procedure: BREAST LUMPECTOMY WITH RADIOACTIVE SEED LOCALIZATION;  Surgeon: [name], MD;  Location: [place];  Service: General;  Laterality: Left  Past/Anticipated interventions by medical oncology, if any: anastrozole to start after radiation  Lymphedema issues, if any:  no    Pain issues, if any:  no   GYN history: Menarchal: 4 , was 88 years with birth of her son, LMP: 31 Contraceptive: 8 HRT: 5 years, stopped when she was 1 G1P1: no breast feeding    SAFETY ISSUES:  Prior radiation? no  Pacemaker/ICD? no  Possible current pregnancy?no  Is the patient on methotrexate? no  Current Complaints / other details:  Patient reports she has felt a lump in her upper portion of her left breast since surgery.  She said it has gone down some.  She has a post op appointment with Dr. [name] on Friday.  There were no vitals taken for this visit.    Michele Liner, RN 03/17/2016,9:17 AM

## 2016-03-18 ENCOUNTER — Telehealth: Payer: Self-pay | Admitting: Genetic Counselor

## 2016-03-18 ENCOUNTER — Ambulatory Visit: Payer: Self-pay | Admitting: Genetic Counselor

## 2016-03-18 DIAGNOSIS — Z1379 Encounter for other screening for genetic and chromosomal anomalies: Secondary | ICD-10-CM

## 2016-03-18 DIAGNOSIS — Z803 Family history of malignant neoplasm of breast: Secondary | ICD-10-CM

## 2016-03-18 DIAGNOSIS — C50412 Malignant neoplasm of upper-outer quadrant of left female breast: Secondary | ICD-10-CM

## 2016-03-18 NOTE — Progress Notes (Signed)
HPI: Ms. Gasser was previously seen in the [facility] clinic due to a personal and family history of cancer and concerns regarding a hereditary predisposition to cancer. Please refer to our prior cancer genetics clinic note for more information regarding Ms. Nelon's medical, social and family histories, and our assessment and recommendations, at the time. Ms. Cryderman recent genetic test results were disclosed to her, as were recommendations warranted by these results. These results and recommendations are discussed in more detail below.  FAMILY HISTORY:  We obtained a detailed, 4-generation family history.  Significant diagnoses are listed below: Family History  Problem Relation Age of Onset  . Cirrhosis Mother   . Breast cancer Mother 44  . Heart failure Father   . Breast cancer Sister 42  . Kidney Stones Sister     loss of kidney due to stones  . Heart attack Father     The patient has one son who is cancer free. She has two sisters, one who had breast cancer at age 39. The patient's mother was diagnosed with breast cancer at age 2 and died at 59. The patient's father died at 28. Her mother had one sister and three brothers who were all cancer free. Her father had two sisters and five brothers who were all cancer free. There is no other reported family history of cancer. Patient's maternal ancestors are of [place] descent, and paternal ancestors are of Caucasian descent. There is no reported Ashkenazi Jewish ancestry. There is no known consanguinity.  GENETIC TEST RESULTS: At the time of Ms. Neils's visit, we recommended she pursue genetic testing of the Breast/Ovarian cancer gene panel. The Breast/Ovarian gene panel offered by [facility] includes sequencing and rearrangement analysis for the following 20 genes:  ATM, BARD1, BRCA1, BRCA2, BRIP1, CDH1, CHEK2, EPCAM, FANCC, MLH1, MSH2, MSH6, NBN, PALB2, PMS2, PTEN, RAD51C, RAD51D, TP53, and XRCC2.   The report date is April 58m 2017.  Genetic testing was normal, and did not reveal a deleterious mutation in these genes. The test report has been scanned into EPIC and is located under the Molecular Pathology section of the Results Review tab.   We discussed with Ms. Meadowcroft that since the current genetic testing is not perfect, it is possible there may be a gene mutation in one of these genes that current testing cannot detect, but that chance is small. We also discussed, that it is possible that another gene that has not yet been discovered, or that we have not yet tested, is responsible for the cancer diagnoses in the family, and it is, therefore, important to remain in touch with cancer genetics in the future so that we can continue to offer Ms. Laurie the most up to date genetic testing.   CANCER SCREENING RECOMMENDATIONS: This result is reassuring and indicates that Ms. Schlag likely does not have an increased risk for a future cancer due to a mutation in one of these genes. This normal test also suggests that Ms. Phillis's cancer was most likely not due to an inherited predisposition associated with one of these genes.  Most cancers happen by chance and this negative test suggests that her cancer falls into this category.  We, therefore, recommended she continue to follow the cancer management and screening guidelines provided by her oncology and primary healthcare provider.   RECOMMENDATIONS FOR FAMILY MEMBERS: Women in this family might be at some increased risk of developing cancer, over the general population risk, simply due to the family history of  cancer. We recommended women in this family have a yearly mammogram beginning at age 44, or 30 years younger than the earliest onset of cancer, an an annual clinical breast exam, and perform monthly breast self-exams. Women in this family should also have a gynecological exam as recommended by their primary provider. All family members should have a colonoscopy by age  46.  FOLLOW-UP: Lastly, we discussed with Ms. Rolland that cancer genetics is a rapidly advancing field and it is possible that new genetic tests will be appropriate for her and/or her family members in the future. We encouraged her to remain in contact with cancer genetics on an annual basis so we can update her personal and family histories and let her know of advances in cancer genetics that may benefit this family.   Our contact number was provided. Ms. Philipps questions were answered to her satisfaction, and she knows she is welcome to call us at anytime with additional questions or concerns.   Roma Kayser, MS, [facility] Certified Genetic Counselor [email or web address]

## 2016-03-18 NOTE — Telephone Encounter (Signed)
Negative genetic testing on the breast/ovarian cancer panel. Discussed that this was somewhat expected due to those in her family who have been diagnosed with breast cancer in their 109s, 76s, and 59s.  Patient voiced her understanding of the results.

## 2016-03-19 ENCOUNTER — Ambulatory Visit (INDEPENDENT_AMBULATORY_CARE_PROVIDER_SITE_OTHER): Payer: Medicare Other | Admitting: Interventional Cardiology

## 2016-03-19 ENCOUNTER — Encounter: Payer: Self-pay | Admitting: Interventional Cardiology

## 2016-03-19 VITALS — BP 120/60 | HR 68 | Ht 64.0 in | Wt 142.1 lb

## 2016-03-19 DIAGNOSIS — I481 Persistent atrial fibrillation: Secondary | ICD-10-CM | POA: Diagnosis not present

## 2016-03-19 DIAGNOSIS — E785 Hyperlipidemia, unspecified: Secondary | ICD-10-CM

## 2016-03-19 DIAGNOSIS — I10 Essential (primary) hypertension: Secondary | ICD-10-CM

## 2016-03-19 DIAGNOSIS — Z79899 Other long term (current) drug therapy: Secondary | ICD-10-CM

## 2016-03-19 DIAGNOSIS — I4819 Other persistent atrial fibrillation: Secondary | ICD-10-CM

## 2016-03-19 NOTE — Patient Instructions (Signed)
Medication Instructions:  Your physician recommends that you continue on your current medications as directed. Please refer to the Current Medication list given to you today.  Labwork: Your physician recommends that you return for lab work in: 6 MONTHS (LIVER and TSH)  Testing/Procedures: No new orders.   Follow-Up: Your physician wants you to follow-up in: 6 MONTHS with Dr Tamala Julian. You will receive a reminder letter in the mail two months in advance. If you don't receive a letter, please call our office to schedule the follow-up appointment.   Any Other Special Instructions Will Be Listed Below (If Applicable).     If you need a refill on your cardiac medications before your next appointment, please call your pharmacy.

## 2016-03-19 NOTE — Progress Notes (Signed)
Cardiology Office Note   Date:  03/19/2016   ID:  Michele Green, DOB 06-Jun-1941, MRN [id number]  PCP:  [name], MD  Cardiologist:  Sinclair Grooms, MD   Chief Complaint  Patient presents with  . Atrial Fibrillation      History of Present Illness: Michele Green is a 75 y.o. female who presents for f/u of persistent atrial fibrillation on Xarelto, hypertension and chronic diastolic heart failure. She had a previous electrical cardioversion on 10/02/2015. Post cardioversion course was complicated by bradycardia into the 40s to 50s, digoxin and atenolol were discontinued. She was maintained on Xarelto and amiodarone only  She is in rhythm and feels well. Now dealing with breast cancer. No episodes of recurrent A. fib. No bleeding on anticoagulation therapy.  Past Medical History  Diagnosis Date  . Osteopenia   . Hypertension   . Colon polyp 02/2012  . Colitis   . Dupuytren contracture     bilateral hands  . Wears glasses   . Persistent atrial fibrillation ([place]) 08/22/2015    Started late August or early September.   . On continuous oral anticoagulation 08/22/2015    Started on Xarelto 08/12/2015   . Dysrhythmia   . Breast cancer of upper-outer quadrant of left female breast ([place]) 02/02/2016  . Breast cancer ([place])   . Family history of breast cancer     Past Surgical History  Procedure Laterality Date  . Dupuytren contracture release  2001    leftx2  . Tonsillectomy  age 65  . Dupuytren contracture release  1990    right  . Colonoscopy    . Dupuytren contracture release Right 05/02/2014    Procedure: EXCISION DUPUYTRENS RIGHT PALMAR/SMALL ;  Surgeon: Cammie Sickle, MD;  Location: [facility];  Service: Orthopedics;  Laterality: Right;  . Cardioversion N/A 10/02/2015    Procedure: CARDIOVERSION;  Surgeon: Jerline Pain, MD;  Location: [facility] ENDOSCOPY;  Service: Cardiovascular;  Laterality: N/A;  . Breast lumpectomy with radioactive seed  localization Left 02/23/2016    Procedure: BREAST LUMPECTOMY WITH RADIOACTIVE SEED LOCALIZATION;  Surgeon: [name], MD;  Location: [facility];  Service: General;  Laterality: Left;     Current Outpatient Prescriptions  Medication Sig Dispense Refill  . allopurinol (ZYLOPRIM) 100 MG tablet Take 100 mg by mouth daily.     [name] amiodarone (PACERONE) 200 MG tablet Take 1 tablet (200 mg total) by mouth daily. 90 tablet 3  . CALCIUM PO Take 1 tablet by mouth daily. Reported on 02/04/2016    . ergocalciferol (VITAMIN D2) 50000 UNITS capsule Take 50,000 Units by mouth once a week. Reported on 02/04/2016    . HYDROcodone-acetaminophen (NORCO/VICODIN) 5-325 MG tablet Take 1-2 tablets by mouth every 4 (four) hours as needed for moderate pain or severe pain. 30 tablet 0  . LIALDA 1.2 G EC tablet Take 1.2 g by mouth 3 (three) times daily.     . rivaroxaban (XARELTO) 20 MG TABS tablet Take 1 tablet (20 mg total) by mouth daily with supper. 90 tablet 3  . simvastatin (ZOCOR) 20 MG tablet Take 20 mg by mouth daily.      No current facility-administered medications for this visit.    Allergies:   Review of patient's allergies indicates no known allergies.    Social History:  The patient  reports that she quit smoking about 23 years ago. Her smoking use included Cigarettes. She has a 20 pack-year smoking history. She  has never used smokeless tobacco. She reports that she drinks about 6.0 oz of alcohol per week. She reports that she does not use illicit drugs.   Family History:  The patient's family history includes Breast cancer (age of onset: 59) in her sister; Breast cancer (age of onset: 61) in her mother; Cirrhosis in her mother; Heart attack in her father; Heart failure in her father; Kidney Stones in her sister.    ROS:  Please see the history of present illness.   Otherwise, review of systems are positive for Fatigue.   All other systems are reviewed and negative.    PHYSICAL  EXAM: VS:  BP 120/60 mmHg  Pulse 68  Ht 5' 4"  (1.626 m)  Wt 142 lb 1.9 oz (64.465 kg)  BMI 24.38 kg/m2 , BMI Body mass index is 24.38 kg/(m^2). GEN: Well nourished, well developed, in no acute distress HEENT: normal Neck: no JVD, carotid bruits, or masses Cardiac: RRR.  There is no murmur, rub, or gallop. There is no edema. Respiratory:  clear to auscultation bilaterally, normal work of breathing. GI: soft, nontender, nondistended, + BS MS: no deformity or atrophy Skin: warm and dry, no rash Neuro:  Strength and sensation are intact Psych: euthymic mood, full affect   EKG:  EKG is not ordered today.    Recent Labs: 02/04/2016: ALT 37; HGB 14.0; Platelets 143 Occ Large & giant platelets* 02/18/2016: BUN 12; Creatinine, Ser 0.75; Potassium 3.9; Sodium 142    Lipid Panel No results found for: CHOL, TRIG, HDL, CHOLHDL, VLDL, LDLCALC, LDLDIRECT    Wt Readings from Last 3 Encounters:  03/19/16 142 lb 1.9 oz (64.465 kg)  03/11/16 142 lb 9.6 oz (64.683 kg)  02/23/16 141 lb (63.957 kg)      Other studies Reviewed: Additional studies/ records that were reviewed today include: None. The findings include recent liver panel was normal..    ASSESSMENT AND PLAN:  1. Persistent atrial fibrillation (HCC) Resolved with rhythm control on amiodarone  2. Essential hypertension Controlled  3. On amiodarone therapy No obvious toxicity  4. Hyperlipidemia Managed by primary care    Current medicines are reviewed at length with the patient today.  The patient has the following concerns regarding medicines: .  The following changes/actions have been instituted:    TSH and hepatic panel in 6 months  Labs/ tests ordered today include:  No orders of the defined types were placed in this encounter.     Disposition:   FU with HS in 6 months  Signed, Sinclair Grooms, MD  03/19/2016 10:24 AM    [facility] [address], [place], [place]   [address] Phone: [phone]; Fax: [phone]

## 2016-03-24 ENCOUNTER — Ambulatory Visit
Admission: RE | Admit: 2016-03-24 | Discharge: 2016-03-24 | Disposition: A | Discharge status: Home / Self Care | Payer: Medicare Other | Source: Ambulatory Visit | Attending: Radiation Oncology | Admitting: Radiation Oncology

## 2016-03-24 DIAGNOSIS — C50412 Malignant neoplasm of upper-outer quadrant of left female breast: Secondary | ICD-10-CM | POA: Diagnosis present

## 2016-03-24 DIAGNOSIS — Z510 Encounter for antineoplastic radiation therapy: Secondary | ICD-10-CM | POA: Insufficient documentation

## 2016-03-24 DIAGNOSIS — Z170 Estrogen receptor positive status [ER+]: Secondary | ICD-10-CM | POA: Diagnosis not present

## 2016-03-24 NOTE — Progress Notes (Signed)
Please see the Nurse Progress Note in the MD Initial Consult Encounter for this patient. 

## 2016-03-24 NOTE — Progress Notes (Signed)
Radiation Oncology         [phone] ________________________________  Name: Michele Green MRN: [id number]  Date: 03/24/2016  DOB: Mar 13, 1941  Re-Evaluation Visit Note  CC: Michele Neer, MD  Michele Seltzer, MD    ICD-9-CM ICD-10-CM   1. 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 I saw the patient in breast clinic on 02/04/16. The patient had a left breast lumpectomy  on 02/23/16. Lumpectomy showed a 1.0 cm grade 1 invasive ductal carcinoma, atypical ductal hyperplasia, lobular neoplasia, and the invasive carcinoma was broadly less than 0.1 cm to the anterior/inferior margin. Excision of the left inferior margin noted lobular neoplasia (lobular carcinoma in situ). A sentinel lymph node biopsy was not performed in light of technical issues.  The patient returned to Dr. Burr Green on 03/11/16 who did not recommend adjuvant chemotherapy and will start the patient on Arimidex after completing radiation treatment. The patient presents today to discuss radiation in the management of her disease.  ALLERGIES:  has No Known Allergies.  Meds: Current Outpatient Prescriptions  Medication Sig Dispense Refill  . allopurinol (ZYLOPRIM) 100 MG tablet Take 100 mg by mouth daily.     [name] amiodarone (PACERONE) 200 MG tablet Take 1 tablet (200 mg total) by mouth daily. 90 tablet 3  . CALCIUM PO Take 1 tablet by mouth daily. Reported on 02/04/2016    . ergocalciferol (VITAMIN D2) 50000 UNITS capsule Take 50,000 Units by mouth once a week. Reported on 02/04/2016    . LIALDA 1.2 G EC tablet Take 1.2 g by mouth 3 (three) times daily.     . rivaroxaban (XARELTO) 20 MG TABS tablet Take 1 tablet (20 mg total) by mouth daily with supper. 90 tablet 3  . simvastatin (ZOCOR) 20 MG tablet Take 20 mg by mouth  daily.      No current facility-administered medications for this encounter.    Physical Findings: The patient is in no acute distress. Patient is alert and oriented.  Lungs are clear to auscultation bilaterally. Heart has regular rate and rhythm. No palpable cervical, supraclavicular, or axillary adenopathy. Abdomen soft, non-tender, normal bowel sounds.  Left breast has a well healing scar in the UOQ. Inferiorly and medially to the scar is a approximately 4x4 cm seroma.  Lab Findings: Lab Results  Component Value Date   WBC 4.7 02/04/2016   HGB 14.0 02/04/2016   HCT 41.5 02/04/2016   MCV 108.3* 02/04/2016   PLT 143 Occ Large & giant platelets* 02/04/2016    Radiographic Findings: No results found.  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 Appropriate treatment would either be anti-hormonal therapy or radiation therapy. However, the patient would like to be agressive and do both.  I spoke to the patient today regarding her diagnosis and options for treatment. We discussed the equivalence in terms of survival and local failure between mastectomy and breast conservation. We discussed the role of radiation in decreasing local failures in patients who undergo lumpectomy. We discussed the process of simulation and the placement tattoos. We discussed 4-6 weeks of treatment as an outpatient. We discussed the possibility of asymptomatic lung  damage. We discussed the low likelihood of secondary malignancies. We discussed the possible side effects including but not limited to skin redness, fatigue, permanent skin darkening, and breast swelling.  We discussed the use of cardiac sparing with deep inspiration breath hold if needed.  Plan: CT simulation has been scheduled for 04/06/16 at 11AM. We would begin treatments  soon afterwards.  The patient will see Dr. Excell Green later this week for evaluation of her seroma and routine postoperative care. ____________________________________ -----------------------------------  Blair Promise, PhD, MD  This document serves as a record of services personally performed by Gery Pray, MD. It was created on his behalf by Darcus Austin, a trained medical scribe. The creation of this record is based on the scribe's personal observations and the provider's statements to them. This document has been checked and approved by the attending provider.

## 2016-04-06 ENCOUNTER — Ambulatory Visit
Admission: RE | Admit: 2016-04-06 | Discharge: 2016-04-06 | Disposition: A | Discharge status: Home / Self Care | Payer: Medicare Other | Source: Ambulatory Visit | Attending: Radiation Oncology | Admitting: Radiation Oncology

## 2016-04-06 DIAGNOSIS — C50412 Malignant neoplasm of upper-outer quadrant of left female breast: Secondary | ICD-10-CM

## 2016-04-06 DIAGNOSIS — Z510 Encounter for antineoplastic radiation therapy: Secondary | ICD-10-CM | POA: Diagnosis not present

## 2016-04-06 NOTE — Progress Notes (Signed)
  Radiation Oncology         [phone] ________________________________  Name: Michele Green MRN: [id number]  Date: 04/06/2016  DOB: 1941/01/15  SIMULATION AND TREATMENT PLANNING NOTE    ICD-9-CM ICD-10-CM   1. 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  Identity was confirmed.  All relevant records and images related to the planned course of therapy were reviewed.  The patient freely provided informed written consent to proceed with treatment after reviewing the details related to the planned course of therapy. The consent form was witnessed and verified by the simulation staff.  Then, the patient was set-up in a stable reproducible  supine position for radiation therapy.  CT images were obtained.  Surface markings were placed.  The CT images were loaded into the planning software.  Then the target and avoidance structures were contoured.  Treatment planning then occurred.  The radiation prescription was entered and confirmed.  Then, I designed and supervised the construction of a total of 3 medically necessary complex treatment devices.  I have requested : 3D Simulation  I have requested a DVH of the following structures: lumpectomy cavity, lungs, heart, .  I have ordered:dose calc.  PLAN:  The patient will receive 50.4 Gy in 28 fractions Followed by a boost to the lumpectomy cavity of 10 gray and a cumulative dose of 60.4 gray.  If technically possible the patient will be treated with hypo-fractionated accelerated radiation therapy.  ________________________________  -----------------------------------  Blair Promise, PhD, MD

## 2016-04-07 ENCOUNTER — Other Ambulatory Visit: Payer: Self-pay | Admitting: *Deleted

## 2016-04-07 ENCOUNTER — Telehealth: Payer: Self-pay | Admitting: Hematology

## 2016-04-07 NOTE — Telephone Encounter (Signed)
per pof to sch pt appt-cld and left pt a message of time & date of appt for 7/24 @ 9:30

## 2016-04-13 DIAGNOSIS — Z510 Encounter for antineoplastic radiation therapy: Secondary | ICD-10-CM | POA: Diagnosis not present

## 2016-04-16 ENCOUNTER — Encounter (HOSPITAL_COMMUNITY): Payer: Self-pay

## 2016-04-20 ENCOUNTER — Ambulatory Visit
Admission: RE | Admit: 2016-04-20 | Discharge: 2016-04-20 | Disposition: A | Discharge status: Home / Self Care | Payer: Medicare Other | Source: Ambulatory Visit | Attending: Radiation Oncology | Admitting: Radiation Oncology

## 2016-04-20 DIAGNOSIS — Z510 Encounter for antineoplastic radiation therapy: Secondary | ICD-10-CM | POA: Diagnosis not present

## 2016-04-20 DIAGNOSIS — C50412 Malignant neoplasm of upper-outer quadrant of left female breast: Secondary | ICD-10-CM

## 2016-04-20 NOTE — Progress Notes (Signed)
  Radiation Oncology         [phone] ________________________________  Name: Michele Green MRN: [id number]  Date: 04/20/2016  DOB: [birth year]  Simulation Verification Note    ICD-9-CM ICD-10-CM   1. Breast cancer of upper-outer quadrant of left female breast ([place]) 174.4 C50.412     Status: outpatient  NARRATIVE: The patient was brought to the treatment unit and placed in the planned treatment position. The clinical setup was verified. Then port films were obtained and uploaded to the radiation oncology medical record software.  The treatment beams were carefully compared against the planned radiation fields. The position location and shape of the radiation fields was reviewed. They targeted volume of tissue appears to be appropriately covered by the radiation beams. Organs at risk appear to be excluded as planned.  Based on my personal review, I approved the simulation verification. The patient's treatment will proceed as planned.  -----------------------------------  Blair Promise, PhD, MD

## 2016-04-21 ENCOUNTER — Ambulatory Visit
Admission: RE | Admit: 2016-04-21 | Discharge: 2016-04-21 | Disposition: A | Discharge status: Home / Self Care | Payer: Medicare Other | Source: Ambulatory Visit | Attending: Radiation Oncology | Admitting: Radiation Oncology

## 2016-04-21 ENCOUNTER — Encounter: Payer: Self-pay | Admitting: Radiation Oncology

## 2016-04-21 VITALS — BP 120/52 | HR 72 | Temp 97.8°F | Ht 64.0 in | Wt 146.9 lb

## 2016-04-21 DIAGNOSIS — Z510 Encounter for antineoplastic radiation therapy: Secondary | ICD-10-CM | POA: Diagnosis not present

## 2016-04-21 DIAGNOSIS — C50412 Malignant neoplasm of upper-outer quadrant of left female breast: Secondary | ICD-10-CM

## 2016-04-21 MED ORDER — ALRA NON-METALLIC DEODORANT (RAD-ONC)
1.0000 "application " | Freq: Once | TOPICAL | Status: AC
  Administered 2016-04-21: 1 via TOPICAL

## 2016-04-21 MED ORDER — RADIAPLEXRX EX GEL
Freq: Once | CUTANEOUS | Status: AC
  Administered 2016-04-21: 15:00:00 via TOPICAL

## 2016-04-21 NOTE — Progress Notes (Signed)
  Radiation Oncology         [phone] ________________________________  Name: Michele Green MRN: [id number]  Date: 04/21/2016  DOB: 06/10/1941  Weekly Radiation Therapy Management   ICD-9-CM ICD-10-CM   1. Breast cancer of upper-outer quadrant of left female breast (HCC) 174.4 C50.412     Current Dose: 2.67 Gy     Planned Dose:  52.72 Gy  Narrative . . . . . . . . The patient presents for routine under treatment assessment.                                  The patient is here for her radiation education. She was given the Radiation and You booklet, skin care instructions, Alra deodorant, and Radiaplex gel. Pt reports that she has not watched the Radiation Therapy Education video and has been given the link to watch at home. The nurse reviewed areas of pertinence; such as fatigue, skin changes, breast tenderness, and breast swelling. Pt demonstrated understanding and verbalizes understanding of information given and will contact nursing with any questions or concerns.                                 Set-up films were reviewed.                                 The chart was checked. Physical Findings. . .  height is 5' 4"  (1.626 m) and weight is 146 lb 14.4 oz (66.633 kg). Her oral temperature is 97.8 F (36.6 C). Her blood pressure is 120/52 and her pulse is 72.  Weight essentially stable.  No significant changes. Lungs are clear to auscultation bilaterally. Heart has regular rate and rhythm. No appreciable radiation reaction at this time. Impression . . . . . . . The patient is tolerating radiation. Plan . . . . . . . . . . . . Continue treatment as planned.  ________________________________   Blair Promise, PhD, MD  This document serves as a record of services personally performed by Gery Pray, MD. It was created on his behalf by Darcus Austin, a trained medical scribe. The creation of this record is based on the scribe's personal observations and the provider's statements to  them. This document has been checked and approved by the attending provider.

## 2016-04-21 NOTE — Progress Notes (Signed)
Pt here for patient teaching.  Pt given Radiation and You booklet, skin care instructions, Alra deodorant and Radiaplex gel. Pt reports they have not watched the Radiation Therapy Education video and has been given the link to watch at home. Reviewed areas of pertinence such as fatigue, skin changes, breast tenderness and breast swelling . Pt able to give teach back of to pat skin and use unscented/gentle soap,apply Radiaplex bid and avoid applying anything to skin within 4 hours of treatment. Pt demonstrated understanding and verbalizes understanding of information given and will contact nursing with any questions or concerns.     Http://[email or web address]/treatmentinformation/whattoexpect/index

## 2016-04-21 NOTE — Progress Notes (Addendum)
Michele Green has completed 1 fractions to her left breast.  The skin on her left breast is intact.  BP 120/52 mmHg  Pulse 72  Temp(Src) 97.8 F (36.6 C) (Oral)  Ht 5' 4"  (1.626 m)  Wt 146 lb 14.4 oz (66.633 kg)  BMI 25.20 kg/m2

## 2016-04-22 ENCOUNTER — Ambulatory Visit
Admission: RE | Admit: 2016-04-22 | Discharge: 2016-04-22 | Disposition: A | Discharge status: Home / Self Care | Payer: Medicare Other | Source: Ambulatory Visit | Attending: Radiation Oncology | Admitting: Radiation Oncology

## 2016-04-22 DIAGNOSIS — Z510 Encounter for antineoplastic radiation therapy: Secondary | ICD-10-CM | POA: Diagnosis not present

## 2016-04-23 ENCOUNTER — Ambulatory Visit
Admission: RE | Admit: 2016-04-23 | Discharge: 2016-04-23 | Disposition: A | Discharge status: Home / Self Care | Payer: Medicare Other | Source: Ambulatory Visit | Attending: Radiation Oncology | Admitting: Radiation Oncology

## 2016-04-23 DIAGNOSIS — Z510 Encounter for antineoplastic radiation therapy: Secondary | ICD-10-CM | POA: Diagnosis not present

## 2016-04-26 ENCOUNTER — Ambulatory Visit
Admission: RE | Admit: 2016-04-26 | Discharge: 2016-04-26 | Disposition: A | Discharge status: Home / Self Care | Payer: Medicare Other | Source: Ambulatory Visit | Attending: Radiation Oncology | Admitting: Radiation Oncology

## 2016-04-26 DIAGNOSIS — Z510 Encounter for antineoplastic radiation therapy: Secondary | ICD-10-CM | POA: Diagnosis not present

## 2016-04-27 ENCOUNTER — Ambulatory Visit
Admission: RE | Admit: 2016-04-27 | Discharge: 2016-04-27 | Disposition: A | Discharge status: Home / Self Care | Payer: Medicare Other | Source: Ambulatory Visit | Attending: Radiation Oncology | Admitting: Radiation Oncology

## 2016-04-27 ENCOUNTER — Encounter: Payer: Self-pay | Admitting: Radiation Oncology

## 2016-04-27 VITALS — BP 136/57 | HR 73 | Temp 97.9°F | Ht 64.0 in | Wt 146.6 lb

## 2016-04-27 DIAGNOSIS — C50412 Malignant neoplasm of upper-outer quadrant of left female breast: Secondary | ICD-10-CM

## 2016-04-27 DIAGNOSIS — Z510 Encounter for antineoplastic radiation therapy: Secondary | ICD-10-CM | POA: Diagnosis not present

## 2016-04-27 NOTE — Progress Notes (Signed)
  Radiation Oncology         [phone] ________________________________  Name: Michele Green MRN: [id number]  Date: 04/27/2016  DOB: [birth year]  Weekly Radiation Therapy Management   ICD-9-CM ICD-10-CM   1. Breast cancer of upper-outer quadrant of left female breast ([place]) 174.4 C50.412      Stage IA (T1a, N0, M0) breast cancer of the upper-outer quadrant of the left female breast ([place]).  Current Dose: 13.35 Gy     Planned Dose:  52.72 Gy  Narrative . . . . . . . . The patient presents for routine under treatment assessment.                                  Michele Green has completed 5 fractions to her left breast. She denies pain and fatigue. She has noticed that her throat is "scratchy" and said it started yesterday. She thinks it is from sinus drainage. She is using radiaplex. The skin on her left breast is intact.                                 Set-up films were reviewed.                                 The chart was checked. Physical Findings. . .  height is 5' 4"  (1.626 m) and weight is 146 lb 9.6 oz (66.497 kg). Her oral temperature is 97.9 F (36.6 C). Her blood pressure is 136/57 and her pulse is 73.  Weight essentially stable.  No significant changes. Lungs are clear to auscultation bilaterally. Heart has regular rate and rhythm. Her skin is slightly pink. Impression . . . . . . . The patient is tolerating radiation. Plan . . . . . . . . . . . . Continue treatment as planned.  ________________________________   Blair Promise, PhD, MD    This document serves as a record of services personally performed by Gery Pray, MD. It was created on his behalf by Lendon Collar, a trained medical scribe. The creation of this record is based on the scribe's personal observations and the provider's statements to them. This document has been checked and approved by the attending provider.

## 2016-04-27 NOTE — Progress Notes (Signed)
Trixie Maclaren has completed 5 fractions to her left breast.  She denies pain and fatigue.  She has noticed that her throat is "scratchy" and said it started yesterday.  She thinks it is from sinus drainage.  She is using radiaplex.  The skin on her left breast is intact.    BP 136/57 mmHg  Pulse 73  Temp(Src) 97.9 F (36.6 C) (Oral)  Ht 5' 4"  (1.626 m)  Wt 146 lb 9.6 oz (66.497 kg)  BMI 25.15 kg/m2   Wt Readings from Last 3 Encounters:  04/27/16 146 lb 9.6 oz (66.497 kg)  04/21/16 146 lb 14.4 oz (66.633 kg)  03/19/16 142 lb 1.9 oz (64.465 kg)

## 2016-04-28 ENCOUNTER — Ambulatory Visit
Admission: RE | Admit: 2016-04-28 | Discharge: 2016-04-28 | Disposition: A | Discharge status: Home / Self Care | Payer: Medicare Other | Source: Ambulatory Visit | Attending: Radiation Oncology | Admitting: Radiation Oncology

## 2016-04-28 DIAGNOSIS — Z510 Encounter for antineoplastic radiation therapy: Secondary | ICD-10-CM | POA: Diagnosis not present

## 2016-04-29 ENCOUNTER — Ambulatory Visit
Admission: RE | Admit: 2016-04-29 | Discharge: 2016-04-29 | Disposition: A | Discharge status: Home / Self Care | Payer: Medicare Other | Source: Ambulatory Visit | Attending: Radiation Oncology | Admitting: Radiation Oncology

## 2016-04-29 ENCOUNTER — Telehealth: Payer: Self-pay | Admitting: *Deleted

## 2016-04-29 DIAGNOSIS — Z510 Encounter for antineoplastic radiation therapy: Secondary | ICD-10-CM | POA: Diagnosis not present

## 2016-04-29 NOTE — Telephone Encounter (Signed)
  Oncology Nurse Navigator Documentation    Navigator Encounter Type: Telephone (Left vm for return call to assess needs during xrt) (04/29/16 1400) Telephone: Outgoing Call (04/29/16 1400)     Surgery Date: 02/23/16 (04/29/16 1400) Treatment Initiated Date: 02/23/16 (04/29/16 1400) Patient Visit Type: RadOnc (04/29/16 1400) Treatment Phase: First Radiation Tx (04/29/16 1400)                            Time Spent with Patient: 15 (04/29/16 1400)

## 2016-04-30 ENCOUNTER — Ambulatory Visit
Admission: RE | Admit: 2016-04-30 | Discharge: 2016-04-30 | Disposition: A | Discharge status: Home / Self Care | Payer: Medicare Other | Source: Ambulatory Visit | Attending: Radiation Oncology | Admitting: Radiation Oncology

## 2016-04-30 ENCOUNTER — Encounter: Payer: Self-pay | Admitting: *Deleted

## 2016-04-30 DIAGNOSIS — Z510 Encounter for antineoplastic radiation therapy: Secondary | ICD-10-CM | POA: Diagnosis not present

## 2016-05-03 ENCOUNTER — Ambulatory Visit
Admission: RE | Admit: 2016-05-03 | Discharge: 2016-05-03 | Disposition: A | Discharge status: Home / Self Care | Payer: Medicare Other | Source: Ambulatory Visit | Attending: Radiation Oncology | Admitting: Radiation Oncology

## 2016-05-03 DIAGNOSIS — Z510 Encounter for antineoplastic radiation therapy: Secondary | ICD-10-CM | POA: Diagnosis not present

## 2016-05-04 ENCOUNTER — Ambulatory Visit
Admission: RE | Admit: 2016-05-04 | Discharge: 2016-05-04 | Disposition: A | Discharge status: Home / Self Care | Payer: Medicare Other | Source: Ambulatory Visit | Attending: Radiation Oncology | Admitting: Radiation Oncology

## 2016-05-04 ENCOUNTER — Encounter: Payer: Self-pay | Admitting: Radiation Oncology

## 2016-05-04 VITALS — BP 133/77 | HR 71 | Temp 97.9°F | Resp 16 | Ht 64.0 in | Wt 146.0 lb

## 2016-05-04 DIAGNOSIS — C50412 Malignant neoplasm of upper-outer quadrant of left female breast: Secondary | ICD-10-CM

## 2016-05-04 DIAGNOSIS — Z510 Encounter for antineoplastic radiation therapy: Secondary | ICD-10-CM | POA: Diagnosis not present

## 2016-05-04 NOTE — Progress Notes (Signed)
  Radiation Oncology         [phone] ________________________________  Name: KALYNN DECLERCQ MRN: [id number]  Date: 05/04/2016  DOB: [birth year]  Weekly Radiation Therapy Management    ICD-9-CM ICD-10-CM   1. Breast cancer of upper-outer quadrant of left female breast ([facility]) 174.4 C50.412     Current Dose: 26.7 Gy     Planned Dose:  52.72 Gy  Narrative . . . . . . . . The patient presents for routine under treatment assessment.                                   The patient is without complaint. Fatigue or breast symptoms                                 Set-up films were reviewed.                                 The chart was checked. Physical Findings. . .  height is 5' 4"  (1.626 m) and weight is 146 lb (66.225 kg). Her oral temperature is 97.9 F (36.6 C). Her blood pressure is 133/77 and her pulse is 71. Her respiration is 16. .Lungs are clear to auscultation bilaterally. Heart has regular rate and rhythm. No palpable cervical, supraclavicular, or axillary adenopathy. Abdomen soft, non-tender, normal bowel sounds. The left breast area shows some mild erythema.   Impression . . . . . . . The patient is tolerating radiation. Plan . . . . . . . . . . . . Continue treatment as planned.  ________________________________   Blair Promise, PhD, MD

## 2016-05-04 NOTE — Progress Notes (Signed)
Michele Green has completed 10 fractions to her left breast.  She denies having pain or fatigue.  She is using radiaplex gel.  The skin on her left breast is pink.  BP 133/77 mmHg  Pulse 71  Temp(Src) 97.9 F (36.6 C) (Oral)  Resp 16  Ht 5' 4"  (1.626 m)  Wt 146 lb (66.225 kg)  BMI 25.05 kg/m2

## 2016-05-05 ENCOUNTER — Ambulatory Visit
Admission: RE | Admit: 2016-05-05 | Discharge: 2016-05-05 | Disposition: A | Discharge status: Home / Self Care | Payer: Medicare Other | Source: Ambulatory Visit | Attending: Radiation Oncology | Admitting: Radiation Oncology

## 2016-05-05 DIAGNOSIS — Z510 Encounter for antineoplastic radiation therapy: Secondary | ICD-10-CM | POA: Diagnosis not present

## 2016-05-06 ENCOUNTER — Ambulatory Visit
Admission: RE | Admit: 2016-05-06 | Discharge: 2016-05-06 | Disposition: A | Discharge status: Home / Self Care | Payer: Medicare Other | Source: Ambulatory Visit | Attending: Radiation Oncology | Admitting: Radiation Oncology

## 2016-05-06 DIAGNOSIS — Z510 Encounter for antineoplastic radiation therapy: Secondary | ICD-10-CM | POA: Diagnosis not present

## 2016-05-07 ENCOUNTER — Ambulatory Visit
Admission: RE | Admit: 2016-05-07 | Discharge: 2016-05-07 | Disposition: A | Discharge status: Home / Self Care | Payer: Medicare Other | Source: Ambulatory Visit | Attending: Radiation Oncology | Admitting: Radiation Oncology

## 2016-05-07 DIAGNOSIS — Z510 Encounter for antineoplastic radiation therapy: Secondary | ICD-10-CM | POA: Diagnosis not present

## 2016-05-10 ENCOUNTER — Ambulatory Visit
Admission: RE | Admit: 2016-05-10 | Discharge: 2016-05-10 | Disposition: A | Discharge status: Home / Self Care | Payer: Medicare Other | Source: Ambulatory Visit | Attending: Radiation Oncology | Admitting: Radiation Oncology

## 2016-05-10 DIAGNOSIS — Z510 Encounter for antineoplastic radiation therapy: Secondary | ICD-10-CM | POA: Diagnosis not present

## 2016-05-11 ENCOUNTER — Encounter: Payer: Self-pay | Admitting: Radiation Oncology

## 2016-05-11 ENCOUNTER — Ambulatory Visit
Admission: RE | Admit: 2016-05-11 | Discharge: 2016-05-11 | Disposition: A | Discharge status: Home / Self Care | Payer: Medicare Other | Source: Ambulatory Visit | Attending: Radiation Oncology | Admitting: Radiation Oncology

## 2016-05-11 ENCOUNTER — Ambulatory Visit: Payer: Medicare Other | Admitting: Radiation Oncology

## 2016-05-11 VITALS — BP 138/77 | HR 71 | Temp 97.9°F | Ht 64.0 in | Wt 144.9 lb

## 2016-05-11 DIAGNOSIS — C50412 Malignant neoplasm of upper-outer quadrant of left female breast: Secondary | ICD-10-CM

## 2016-05-11 DIAGNOSIS — Z510 Encounter for antineoplastic radiation therapy: Secondary | ICD-10-CM | POA: Diagnosis not present

## 2016-05-11 MED ORDER — SONAFINE EX EMUL
1.0000 "application " | Freq: Once | CUTANEOUS | Status: AC
  Administered 2016-05-11: 1 via TOPICAL

## 2016-05-11 NOTE — Progress Notes (Signed)
  Radiation Oncology         [phone] ________________________________  Name: Michele Green MRN: [id number]  Date: 05/11/2016  DOB: [birth year]  Weekly Radiation Therapy Management    ICD-9-CM ICD-10-CM   1. Breast cancer of upper-outer quadrant of left female breast ([facility]) 174.4 C50.412     Current Dose: 40.05 Gy     Planned Dose:  52.72 Gy  Narrative . . . . . . . . The patient presents for routine under treatment assessment.                                   Michele Green is here for her 15th fraction of radiation to her left breast. She denies pain, or fatigue. Her Left Breast is red. She has an area that itches in the upper inner portion of her breast. There is increased redness to this area. She has a small open area to the upper center of her chest which she thinks occurred when she scratched herself. I have suggested neosporin to this area.                                  Set-up films were reviewed.                                 The chart was checked.  Physical Findings. . Tonette Bihari with Age-Percentiles 05/11/2016  Length 850.2 cm  Systolic 774  Diastolic 77  MAP   Pulse 71  Respiration   Weight 65.726 kg  BMI 24.9     Lungs are clear to auscultation bilaterally. Heart has regular rate and rhythm. No palpable cervical, supraclavicular, or axillary adenopathy. Abdomen soft, non-tender, normal bowel sounds. Left breast with erythema.    Impression . . . . . . . The patient is tolerating radiation. I advised for her to use Sonafine to help with her itching.   Plan . . . . . . . . . . . . Continue treatment as planned. Provided patient with Sonafine.   ________________________________   Blair Promise, PhD, MD  This document serves as a record of services personally performed by Gery Pray, MD. It was created on his behalf by Derek Mound, a trained medical scribe. The creation of this record is based on the scribe's personal observations and the provider's  statements to them. This document has been checked and approved by the attending provider.

## 2016-05-11 NOTE — Progress Notes (Signed)
Ms. Keltner is here for her 15th fraction of radiation to her Left Breast. She denies pain, or fatigue. Her Left Breast is red. She has an area that itches in the upper inner portion of her breast. There is increased redness to this area. She has a small open area to the upper center of her chest which she thinks occurred when she scratched herself. I have suggested neosporin to this area.   BP 138/77 mmHg  Pulse 71  Temp(Src) 97.9 F (36.6 C)  Ht 5' 4"  (1.626 m)  Wt 144 lb 14.4 oz (65.726 kg)  BMI 24.86 kg/m2   Wt Readings from Last 3 Encounters:  05/11/16 144 lb 14.4 oz (65.726 kg)  05/04/16 146 lb (66.225 kg)  04/27/16 146 lb 9.6 oz (66.497 kg)

## 2016-05-12 ENCOUNTER — Ambulatory Visit
Admission: RE | Admit: 2016-05-12 | Discharge: 2016-05-12 | Disposition: A | Discharge status: Home / Self Care | Payer: Medicare Other | Source: Ambulatory Visit | Attending: Radiation Oncology | Admitting: Radiation Oncology

## 2016-05-12 DIAGNOSIS — Z510 Encounter for antineoplastic radiation therapy: Secondary | ICD-10-CM | POA: Diagnosis not present

## 2016-05-13 ENCOUNTER — Ambulatory Visit
Admission: RE | Admit: 2016-05-13 | Discharge: 2016-05-13 | Disposition: A | Discharge status: Home / Self Care | Payer: Medicare Other | Source: Ambulatory Visit | Attending: Radiation Oncology | Admitting: Radiation Oncology

## 2016-05-13 DIAGNOSIS — C50412 Malignant neoplasm of upper-outer quadrant of left female breast: Secondary | ICD-10-CM

## 2016-05-13 DIAGNOSIS — Z510 Encounter for antineoplastic radiation therapy: Secondary | ICD-10-CM | POA: Diagnosis not present

## 2016-05-13 NOTE — Progress Notes (Signed)
  Radiation Oncology         [phone]) [phone] ________________________________  Name: Michele Green MRN: [id number]  Date: 05/13/2016  DOB: 21-Nov-1941  Electron beam Simulation Verification Note    ICD-9-CM ICD-10-CM   1. Breast cancer of upper-outer quadrant of left female breast ([place]) 174.4 C50.412     Status: outpatient  NARRATIVE: The patient was brought to the treatment unit and placed in the planned treatment position. The clinical setup was verified. Then port films were obtained and uploaded to the radiation oncology medical record software.  The treatment beams were carefully compared against the planned radiation fields. The position location and shape of the radiation fields was reviewed. They targeted volume of tissue appears to be appropriately covered by the radiation beams. Organs at risk appear to be excluded as planned.  Based on my personal review, I approved the simulation verification. The patient's treatment will proceed as planned.  -----------------------------------  Blair Promise, PhD, MD

## 2016-05-14 ENCOUNTER — Ambulatory Visit
Admission: RE | Admit: 2016-05-14 | Discharge: 2016-05-14 | Disposition: A | Discharge status: Home / Self Care | Payer: Medicare Other | Source: Ambulatory Visit | Attending: Radiation Oncology | Admitting: Radiation Oncology

## 2016-05-14 DIAGNOSIS — Z510 Encounter for antineoplastic radiation therapy: Secondary | ICD-10-CM | POA: Diagnosis not present

## 2016-05-17 ENCOUNTER — Ambulatory Visit
Admission: RE | Admit: 2016-05-17 | Discharge: 2016-05-17 | Disposition: A | Discharge status: Home / Self Care | Payer: Medicare Other | Source: Ambulatory Visit | Attending: Radiation Oncology | Admitting: Radiation Oncology

## 2016-05-17 ENCOUNTER — Ambulatory Visit: Payer: Medicare Other | Admitting: Hematology

## 2016-05-17 DIAGNOSIS — Z510 Encounter for antineoplastic radiation therapy: Secondary | ICD-10-CM | POA: Diagnosis not present

## 2016-05-18 ENCOUNTER — Ambulatory Visit
Admission: RE | Admit: 2016-05-18 | Discharge: 2016-05-18 | Disposition: A | Discharge status: Home / Self Care | Payer: Medicare Other | Source: Ambulatory Visit | Attending: Radiation Oncology | Admitting: Radiation Oncology

## 2016-05-18 ENCOUNTER — Encounter: Payer: Self-pay | Admitting: Radiation Oncology

## 2016-05-18 VITALS — BP 123/65 | HR 73 | Temp 98.0°F | Resp 16 | Ht 64.0 in | Wt 143.5 lb

## 2016-05-18 DIAGNOSIS — C50412 Malignant neoplasm of upper-outer quadrant of left female breast: Secondary | ICD-10-CM | POA: Insufficient documentation

## 2016-05-18 DIAGNOSIS — Z510 Encounter for antineoplastic radiation therapy: Secondary | ICD-10-CM | POA: Diagnosis not present

## 2016-05-18 MED ORDER — SONAFINE EX EMUL
1.0000 "application " | Freq: Two times a day (BID) | CUTANEOUS | Status: DC
  Administered 2016-05-18: 1 via TOPICAL

## 2016-05-18 NOTE — Addendum Note (Signed)
Encounter addended by: Malena Edman, RN on: 05/18/2016  1:21 PM<BR>     Documentation filed: Inpatient MAR

## 2016-05-18 NOTE — Addendum Note (Signed)
Encounter addended by: Malena Edman, RN on: 05/18/2016  3:18 PM<BR>     Documentation filed: Inpatient Patient Education

## 2016-05-18 NOTE — Progress Notes (Addendum)
Michele Green has completed 20 fractions to her left breast. She denies having pain or fatigue. She is using radiaplex gel bid. The skin on her left breast is erythema and rash. States Sonafine  is helping the itching.  Appetite is good.  EOT education done today. BP 123/65 mmHg  Pulse 73  Temp(Src) 98 F (36.7 C) (Oral)  Resp 16  Ht 5' 4"  (1.626 m)  Wt 143 lb 8 oz (65.091 kg)  BMI 24.62 kg/m2  SpO2 98%

## 2016-05-18 NOTE — Addendum Note (Signed)
Encounter addended by: Malena Edman, RN on: 05/18/2016  1:18 PM<BR>     Documentation filed: [employer], Orders

## 2016-05-18 NOTE — Progress Notes (Signed)
  Radiation Oncology         [phone]) [phone] ________________________________  Name: Michele Green MRN: [id number]  Date: 05/18/2016  DOB: 07-20-1941  Weekly Radiation Therapy Management    ICD-9-CM ICD-10-CM   1. Breast cancer of upper-outer quadrant of left female breast ([facility]) 174.4 C50.412     Current Dose: 50.72 Gy     Planned Dose:  52.72 Gy  Narrative . . . . . . . . The patient presents for routine under treatment assessment.  Michele Green has completed 20 fractions to her left breast. She denies having pain or fatigue. She is using Sonafine bid and it is helping with the pruritis. The patient has a good appetite. Her EOT education completed today by the nurse.                                  Set-up films were reviewed.                                 The chart was checked.  Physical Findings. . Danley Danker Vitals:   05/18/16 1212  BP: 123/65  Pulse: 73  Temp: 98 F (36.7 C)  Resp: 16  Lungs are clear to auscultation bilaterally. Heart has regular rate and rhythm. Diffuse erythema throughout the left breast with no skin breakdown. Impression . . . . . . . The patient is tolerating radiation. Plan . . . . . . . . . . . . The patient completes radiation treatment tomorrow and will return in a month for a follow up. The patient was provided with another tube of Sonafine.  ________________________________   Blair Promise, PhD, MD  This document serves as a record of services personally performed by Gery Pray, MD. It was created on his behalf by Darcus Austin, a trained medical scribe. The creation of this record is based on the scribe's personal observations and the provider's statements to them. This document has been checked and approved by the attending provider.

## 2016-05-19 ENCOUNTER — Encounter: Payer: Self-pay | Admitting: Radiation Oncology

## 2016-05-19 ENCOUNTER — Ambulatory Visit: Payer: Medicare Other

## 2016-05-19 ENCOUNTER — Ambulatory Visit
Admission: RE | Admit: 2016-05-19 | Discharge: 2016-05-19 | Disposition: A | Discharge status: Home / Self Care | Payer: Medicare Other | Source: Ambulatory Visit | Attending: Radiation Oncology | Admitting: Radiation Oncology

## 2016-05-19 DIAGNOSIS — Z510 Encounter for antineoplastic radiation therapy: Secondary | ICD-10-CM | POA: Diagnosis not present

## 2016-05-20 ENCOUNTER — Ambulatory Visit: Payer: Medicare Other

## 2016-05-21 ENCOUNTER — Ambulatory Visit: Payer: Medicare Other

## 2016-05-24 ENCOUNTER — Ambulatory Visit: Payer: Medicare Other

## 2016-05-26 ENCOUNTER — Ambulatory Visit: Payer: Medicare Other

## 2016-05-27 ENCOUNTER — Ambulatory Visit: Payer: Medicare Other

## 2016-05-28 ENCOUNTER — Ambulatory Visit: Payer: Medicare Other

## 2016-05-31 ENCOUNTER — Ambulatory Visit: Payer: Medicare Other

## 2016-06-01 ENCOUNTER — Ambulatory Visit: Payer: Medicare Other

## 2016-06-02 ENCOUNTER — Ambulatory Visit: Payer: Medicare Other

## 2016-06-03 ENCOUNTER — Ambulatory Visit: Payer: Medicare Other

## 2016-06-04 ENCOUNTER — Ambulatory Visit: Payer: Medicare Other

## 2016-06-07 ENCOUNTER — Ambulatory Visit: Admission: RE | Admit: 2016-06-07 | Payer: Medicare Other | Source: Ambulatory Visit

## 2016-06-07 ENCOUNTER — Telehealth: Payer: Self-pay | Admitting: *Deleted

## 2016-06-07 NOTE — Telephone Encounter (Signed)
  Oncology Nurse Navigator Documentation  Navigator Location: [facility] (06/07/16 1500) Navigator Encounter Type: Telephone (06/07/16 1500) Telephone: Outgoing Call (06/07/16 1500)         Patient Visit Type: RadOnc (06/07/16 1500) Treatment Phase: Final Radiation Tx (06/07/16 1500)                            Time Spent with Patient: 15 (06/07/16 1500)

## 2016-06-11 ENCOUNTER — Other Ambulatory Visit: Payer: Self-pay | Admitting: *Deleted

## 2016-06-11 DIAGNOSIS — C50412 Malignant neoplasm of upper-outer quadrant of left female breast: Secondary | ICD-10-CM

## 2016-06-14 ENCOUNTER — Telehealth: Payer: Self-pay | Admitting: Hematology

## 2016-06-14 ENCOUNTER — Ambulatory Visit (HOSPITAL_BASED_OUTPATIENT_CLINIC_OR_DEPARTMENT_OTHER): Payer: Medicare Other | Admitting: Hematology

## 2016-06-14 ENCOUNTER — Encounter: Payer: Self-pay | Admitting: Hematology

## 2016-06-14 VITALS — BP 127/71 | HR 70 | Temp 97.7°F | Resp 17 | Ht 64.0 in | Wt 143.6 lb

## 2016-06-14 DIAGNOSIS — I4819 Other persistent atrial fibrillation: Secondary | ICD-10-CM

## 2016-06-14 DIAGNOSIS — I340 Nonrheumatic mitral (valve) insufficiency: Secondary | ICD-10-CM | POA: Diagnosis not present

## 2016-06-14 DIAGNOSIS — I10 Essential (primary) hypertension: Secondary | ICD-10-CM | POA: Diagnosis not present

## 2016-06-14 DIAGNOSIS — C50412 Malignant neoplasm of upper-outer quadrant of left female breast: Secondary | ICD-10-CM

## 2016-06-14 DIAGNOSIS — I481 Persistent atrial fibrillation: Secondary | ICD-10-CM

## 2016-06-14 MED ORDER — ANASTROZOLE 1 MG PO TABS: 1.0000 mg | ORAL_TABLET | Freq: Every day | ORAL | 2 refills | Status: DC

## 2016-06-14 NOTE — Progress Notes (Addendum)
[place]  Telephone:(336) [phone] Fax:(336) [phone]  Clinic Follow Up Note   Patient Care Team: [name], MD as PCP - General (Family Medicine) 06/14/2016  CHIEF COMPLAINTS:  Follow up  left breast cancer  Oncology History   Breast cancer of upper-outer quadrant of left female breast [facility])   Staging form: Breast, AJCC 7th Edition   - Clinical stage from 01/28/2016: Stage IA (T1a, N0, M0) - Signed by Truitt Merle, MD on 02/03/2016   - Pathologic stage from 02/23/2016: Stage IA (T1b, N0, cM0) - Signed by Truitt Merle, MD on 06/14/2016        Breast cancer of upper-outer quadrant of left female breast ([place])   01/14/2016 Mammogram    Screening mammogram showed possible mass with distortion in the left breast.     01/23/2016 Imaging    Diagnostic mammogram and ultrasound of the left breast showed a 3-4 mm shadowing mass at 1:00 in the left breast, no adenopathy.     01/28/2016 Initial Diagnosis    Breast cancer of upper-outer quadrant of left female breast ([place])     01/28/2016 Initial Biopsy    Left breast mass biopsy showed invasive ductal carcinoma, grade 1.     01/28/2016 Receptors her2    Your 100% positive, PR 100% positive, HER-2 negative, Ki-67 5%     02/23/2016 Surgery    Left breast lumpectomy, and sentinel lymph node biopsy     02/23/2016 Pathology Results    Left breast lumpectomy showed a 1.0 cm invasive ductal carcinoma, grade 1, no lymphvascular invasion, invasive carcinoma is broadly less than 0.1 cm to the anterior inferior margin, ADH, ALH, 1 node(-). Left inferior reexcision margin showed LCIS      04/21/2016 - 05/19/2016 Radiation Therapy    adjuvant breast radiation        HISTORY OF PRESENTING ILLNESS:  Michele Green 75 y.o. female is here because of newly diagnosed left breast cancer. She is accompanied by her husband to our multidisciplinary rest clinic today.  This was discovered by screening mammo, she denies any palpable breast mass, skin  change or nipple discharge, she feels well, no complains. She remains to be physically active.   She  was found to have AF and had cardioverstion in 09/2015,  she has been on amiodarone and Xarelto since then. She also has had colitis 2-3 yeears, on liada,  she follows up with gastroenterologist Dr. Amedeo Plenty.  GYN HISTORY  Menarchal: 12 LMP: 48 Contraceptive: 8 HRT: 5 years, stopped when she was 3 G1P1: no breast feeding      CURRENTLY THERAPY: pending Anastrozole 67m daily, will start in a few days   INTERIM HISTORY: PMerilynnreturns for follow-up. She completed adjuvant breast radiation about 4 weeks ago. She tolerated it very well, with mild to moderate radiation dermatitis, which has healed well. No other issues. She otherwise is doing well, denies any significant pain, except mild arthritis pain at right knee, or other symptoms. She has been quite stressed by her husband recently diagnosed [facility], [facility] and will start radiation soon in our cancer center.   MEDICAL HISTORY:  Past Medical History:  Diagnosis Date  . Breast cancer ([facility]   . Breast cancer of upper-outer quadrant of left female breast ([facility] 02/02/2016  . Colitis   . Colon polyp 02/2012  . Dupuytren contracture    bilateral hands  . Dysrhythmia   . Family history of breast cancer   .  Hypertension   . On continuous oral anticoagulation 08/22/2015   Started on Xarelto 08/12/2015   . Osteopenia   . Persistent atrial fibrillation ([place]) 08/22/2015   Started late August or early September.   . Wears glasses     SURGICAL HISTORY: Past Surgical History:  Procedure Laterality Date  . BREAST LUMPECTOMY WITH RADIOACTIVE SEED LOCALIZATION Left 02/23/2016   Procedure: BREAST LUMPECTOMY WITH RADIOACTIVE SEED LOCALIZATION;  Surgeon: [name], MD;  Location: [place];  Service: General;  Laterality: Left;  . CARDIOVERSION N/A 10/02/2015   Procedure: CARDIOVERSION;  Surgeon:  Jerline Pain, MD;  Location: [facility];  Service: Cardiovascular;  Laterality: N/A;  . COLONOSCOPY    . DUPUYTREN CONTRACTURE RELEASE  2001   leftx2  . [place]   right  . DUPUYTREN CONTRACTURE RELEASE Right 05/02/2014   Procedure: EXCISION DUPUYTRENS RIGHT PALMAR/SMALL ;  Surgeon: Cammie Sickle, MD;  Location: [place];  Service: Orthopedics;  Laterality: Right;  . TONSILLECTOMY  age 28    SOCIAL HISTORY: Social History   Social History  . Marital status: Married    Spouse name: N/A  . Number of children: 1  . Years of education: N/A   Occupational History  . Not on file.   Social History Main Topics  . Smoking status: Former Smoker    Packs/day: 1.00    Years: 20.00    Types: Cigarettes    Quit date: 11/22/1992  . Smokeless tobacco: Never Used  . Alcohol use 6.0 oz/week    7 Glasses of wine, 3 Standard drinks or equivalent per week  . Drug use: No  . Sexual activity: No   Other Topics Concern  . Not on file   Social History Narrative  . No narrative on file    FAMILY HISTORY: Family History  Problem Relation Age of Onset  . Cirrhosis Mother   . Breast cancer Mother 48  . Heart failure Father   . Heart attack Father   . Breast cancer Sister 75  . Kidney Stones Sister     loss of kidney due to stones    ALLERGIES:  has No Known Allergies.  MEDICATIONS:  Current Outpatient Prescriptions  Medication Sig Dispense Refill  . allopurinol (ZYLOPRIM) 100 MG tablet Take 100 mg by mouth daily.     [name] amiodarone (PACERONE) 200 MG tablet Take 1 tablet (200 mg total) by mouth daily. 90 tablet 3  . CALCIUM PO Take 1 tablet by mouth daily. Reported on 02/04/2016    . ergocalciferol (VITAMIN D2) 50000 UNITS capsule Take 50,000 Units by mouth once a week. Reported on 02/04/2016    . LIALDA 1.2 G EC tablet Take 1.2 g by mouth 3 (three) times daily.     . rivaroxaban (XARELTO) 20 MG TABS tablet Take 1 tablet (20 mg total) by  mouth daily with supper. 90 tablet 3  . simvastatin (ZOCOR) 20 MG tablet Take 20 mg by mouth daily.     [name] anastrozole (ARIMIDEX) 1 MG tablet Take 1 tablet (1 mg total) by mouth daily. 30 tablet 2   No current facility-administered medications for this visit.     REVIEW OF SYSTEMS:   Constitutional: Denies fevers, chills or abnormal night sweats Eyes: Denies blurriness of vision, double vision or watery eyes Ears, nose, mouth, throat, and face: Denies mucositis or sore throat Respiratory: Denies cough, dyspnea or wheezes Cardiovascular: Denies palpitation, chest discomfort or lower extremity swelling  Gastrointestinal:  Denies nausea, heartburn or change in bowel habits Skin: Denies abnormal skin rashes Lymphatics: Denies new lymphadenopathy or easy bruising Neurological:Denies numbness, tingling or new weaknesses Behavioral/Psych: Mood is stable, no new changes  All other systems were reviewed with the patient and are negative.  PHYSICAL EXAMINATION: ECOG PERFORMANCE STATUS: 0 - Asymptomatic  Vitals:   06/14/16 1132  BP: 127/71  Pulse: 70  Resp: 17  Temp: 97.7 F (36.5 C)   Filed Weights   06/14/16 1132  Weight: 143 lb 9.6 oz (65.1 kg)    GENERAL:alert, no distress and comfortable SKIN: skin color, texture, turgor are normal, no rashes or significant lesions EYES: normal, conjunctiva are pink and non-injected, sclera clear OROPHARYNX:no exudate, no erythema and lips, buccal mucosa, and tongue normal  NECK: supple, thyroid normal size, non-tender, without nodularity LYMPH:  no palpable lymphadenopathy in the cervical, axillary or inguinal LUNGS: clear to auscultation and percussion with normal breathing effort HEART: regular rate & rhythm and no murmurs and no lower extremity edema ABDOMEN:abdomen soft, non-tender and normal bowel sounds Musculoskeletal:no cyanosis of digits and no clubbing  PSYCH: alert & oriented x 3 with fluent speech NEURO: no focal motor/sensory  deficits Breasts: Breast inspection showed them to be symmetrical with no nipple discharge.  Incision in the left breast has healed well. Mild skin pigmentation, prior skin peeling has healed.   LABORATORY DATA:  CBC Latest Ref Rng & Units 02/04/2016 05/02/2014  WBC 3.9 - 10.3 10e3/uL 4.7 -  Hemoglobin 11.6 - 15.9 g/dL 14.0 14.4  Hematocrit 34.8 - 46.6 % 41.5 -  Platelets 145 - 400 10e3/uL 143 Occ Large & giant platelets(L) -   CMP Latest Ref Rng & Units 02/18/2016 02/04/2016 09/29/2015  Glucose 65 - 99 mg/dL 94 98 92  BUN 6 - 20 mg/dL 12 14.0 11  Creatinine 0.44 - 1.00 mg/dL 0.75 0.8 0.82  Sodium 135 - 145 mmol/L 142 143 141  Potassium 3.5 - 5.1 mmol/L 3.9 3.9 4.1  Chloride 101 - 111 mmol/L 108 - 106  CO2 22 - 32 mmol/L _0 Calcium 8.9 - 10.3 mg/dL 9.3 9.4 8.9  Total Protein 6.4 - 8.3 g/dL - 7.3 -  Total Bilirubin 0.20 - 1.20 mg/dL - 0.86 -  Alkaline Phos 40 - 150 U/L - 85 -  AST 5 - 34 U/L - 44(H) -  ALT 0 - 55 U/L - 37 -    Diagnosis 02/23/2016 1. Breast, lumpectomy, left - INVASIVE DUCTAL CARCINOMA, GRADE I/III, SPANNING 1.0 CM. - INVASIVE CARCINOMA IS BROADLY LESS THAN 0.1 CM TO THE ANTERIOR/INFERIOR MARGIN OF SPECIMEN #1. - ATYPICAL DUCTAL HYPERPLASIA. - LOBULAR NEOPLASIA (ATYPICAL LOBULAR HYPERPLASIA) - ONE BENIGN LYMPH NODE (0/1). - SEE ONCOLOGY TABLE BELOW. 2. Breast, excision, left inferior - LOBULAR NEOPLASIA (LOBULAR CARCINOMA IN SITU). - SEE COMMENT. 1. 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 BREAST, INVASIVE TUMOR, WITH LYMPH NODES PRESENT Specimen, including laterality and lymph node sampling (sentinel, non-sentinel): Left breast. Procedure: Seed localized lumpectomy and additional inferior margin resection. Histologic type: Ductal. Grade: 1 Tubule formation: 1 Nuclear pleomorphism: 2 Mitotic:1 Tumor size (gross measurement): 1.0 cm Margins: Invasive, distance to closest margin: Broadly less than 0.1 cm to the anterior/inferior margin  of specimen #1, see comment. Lymphovascular invasion: Not identified. Ductal carcinoma in situ: Not identified. Lobular neoplasia: Present, lobular carcinoma in situ. Tumor focality: Unifocal. Treatment effect: N/A Extent of tumor: Confined to breast parenchyma. Lymph nodes: Examined: 0 Sentinel,  1 Non-sentinel 1 Total Lymph  nodes with metastasis: 0 Breast prognostic profile: Case [id number] Estrogen receptor: 100%, strong staining intensity Progesterone receptor: 100%, strong staining intensity Her 2 neu: No amplification was detected. The ratio was 1.35. Her 2 neu by FISH will be repeated on the current case (block 1A) and the results reported separately. Ki-67: 5% Non-neoplastic breast: Fibrocystic changes with calcifications and healing biopsy site TNM: pT1b, pN0 Comments: Immunohistochemical stains for smooth muscle myosin, calponin, and p63 support the above diagnosis. Although the invasive carcinoma is broadly less than 0.1 cm to the anterior/inferior margin of specimen #1, the separately submitted inferior margin (specimen #2) is negative. 2. The surgical resection margin(s) of the specimen were inked and microscopically evaluated. (JBK:ecj 02/27/2016) Enid Cutter MD Pathologist, Electronic Signature (Case signed 02/27/2016)  Results: HER2 - NEGATIVE RATIO OF HER2/CEP17 SIGNALS 1.54 AVERAGE HER2 COPY NUMBER PER CELL 2.00 RADIOGRAPHIC STUDIES: I have personally reviewed the radiological images as listed and agreed with the findings in the report.   Mm Diag Breast Tomo Uni Left and [place] 01/23/2016  01/23/2016  CLINICAL DATA:  75 year old female presenting for screening recall of left breast distortion. EXAM: DIGITAL DIAGNOSTIC LEFT MAMMOGRAM WITH 3D TOMOSYNTHESIS AND CAD LEFT BREAST ULTRASOUND COMPARISON:  Previous exam(s). ACR Breast Density Category c: The breast tissue is heterogeneously dense, which may obscure small masses. FINDINGS: There is a persistent distortion in  the upper-outer quadrant of the left breast in the posterior depth with a possible tiny associated mass. Mammographic images were processed with CAD. Physical exam of the upper-outer quadrant of the left breast demonstrates a small firm palpable lump at 1 o'clock, 4 cm from the nipple. Ultrasound targeted to the left breast at 1 o'clock, 4 cm from the nipple at the site of the palpable lump, there is a tiny 3-4 mm shadowing mass with possible internal blood flow on color Doppler imaging. Ultrasound of the left axilla demonstrates multiple normal-appearing lymph nodes. IMPRESSION: 1. A 3-4 mm shadowing mass is identified at 1 o'clock in the left breast, likely corresponding with the area of distortion on the mammogram. 2.  No evidence of left axillary lymphadenopathy. RECOMMENDATION: Ultrasound-guided biopsy is recommended for the shadowing mass in the left breast at 1 o'clock. This has been scheduled for 01/28/2016 at 2 p.m. I have discussed the findings and recommendations with the patient. Results were also provided in writing at the conclusion of the visit. If applicable, a reminder letter will be sent to the patient regarding the next appointment. BI-RADS CATEGORY  4: Suspicious. Electronically Signed   By: Ammie Ferrier M.D.   On: 01/23/2016 11:40   Mm Screening Breast Tomo Bilateral  01/14/2016  CLINICAL DATA:  Screening. EXAM: DIGITAL SCREENING BILATERAL MAMMOGRAM WITH 3D TOMO WITH CAD COMPARISON:  Previous exam(s). ACR Breast Density Category c: The breast tissue is heterogeneously dense, which may obscure small masses. FINDINGS: In the left breast, possible mass with distortion warrants further evaluation. In the right breast, no findings suspicious for malignancy. Images were processed with CAD. IMPRESSION: Further evaluation is suggested for possible mass with distortion in the left breast. RECOMMENDATION: Diagnostic mammogram and possibly ultrasound of the left breast. (Code:[id number]-[id number]) The patient  will be contacted regarding the findings, and additional imaging will be scheduled. BI-RADS CATEGORY  0: Incomplete. Need additional imaging evaluation and/or prior mammograms for comparison. Electronically Signed   By: Lajean Manes M.D.   On: 01/14/2016 11:36     ASSESSMENT & PLAN:  75 year-old postmenopausal Caucasian woman, presented with screening this covered  left breast cancer.  1. Breast cancer of upper-outer quadrant of left female breast, G1 invasive ductal carcinoma, pT1bN0M0, stage IA, ER+/PR+/HER2- -I reviewed her surgical pathology findings with patient in details. -She has stage I a disease, low grade, likely will do very well with stroke risk of recurrence. -We discussed the risk of cancer recurrence after complete surgical resection. Giving the very small size of the tumor, low-grade, low Ki-67, ER PR positive HER-2 negative disease, the risk of recurrence is quite low. I do not recommend adjuvant chemotherapy. -She now has completed adjuvant breast radiation. -Given the strong ER and PR positivity, I recommend pt to consider adjuvant aromatase inhibitor to reduce her risk of cancer recurrence. She does have atrial fibrillation, and mitral regurgitation, I discussed the slightly increased risk of cardiovascular disease from aromatase inhibitor, in addition to the common side effects of hot flash, musculoskeletal discomfort, osteopenia and osteoporosis, etc. Patient is interested, and agreed to start in the next few days. Prescription of anastrozole was called into her pharmacy today -We also discussed breast cancer surveillance, including annual screening mammogram, self exam, routine follow-up with exam by [place].  2. HTN, AF, mitral regurgitation  -She'll continue follow-up with her primary care physician and cardiologist.  3. Genetics -Given her positive family history of breast cancer in her mother and a sister, we will refer her to genetic counseling to ruled out inheritable  breast cancer syndrome.  -She was seen by our genetic counselor, and the genetic testing was negative.  Plan -Start anastrozole 1 mg daily in a few days. Prescription was called into her pharmacy today - I'll see her back in 6 weeks for follow-up. -She is scheduled to survivorship clinic in 2 months.   All questions were answered. The patient knows to call the clinic with any problems, questions or concerns. I spent 25 minutes counseling the patient face to face. The total time spent in the appointment was 30 minutes and more than 50% was on counseling.     Truitt Merle, MD 06/14/2016

## 2016-06-14 NOTE — Telephone Encounter (Signed)
Gave pt cal & avs °

## 2016-06-17 NOTE — Progress Notes (Signed)
  Radiation Oncology         (336) [phone] ________________________________  Name: Michele Green MRN: [id number]  Date: 05/19/2016  DOB: 1941/09/18  End of Treatment Note   ICD-9-CM ICD-10-CM    1. Breast cancer of upper-outer quadrant of left female breast ([place]) 174.4 C50.412     DIAGNOSIS: pT1b, pN0 stage IA invasive ductal carcinoma of the left breast (ER/PR positive, HER2 negative)     Indication for treatment:  Curative        Radiation treatment dates:   04/21/2016-05/19/2016  Site/dose:   1.) left breast, 47.72 in 16 fractions 2.) left breast boost, 10 gy in 5 fractions.    Beams/energy:  1.) 3D, 10X, 6X, 2.) En Face, 12 MeV   Narrative: The patient tolerated radiation treatment relatively well.   Towards the end of treatment, the patient expressed that her left breast was red and had an area of itchiness in the upper inner portion of her breast.  She was given radiaplex to treat the skin.   Plan: The patient has completed radiation treatment. The patient will return to radiation oncology clinic for routine followup in one month. I advised them to call or return sooner if they have any questions or concerns related to their recovery or treatment.  -----------------------------------  Blair Promise, PhD, MD  This document serves as a record of services personally performed by Gery Pray, MD. It was created on his behalf by Truddie Hidden, a trained medical scribe. The creation of this record is based on the scribe's personal observations and the provider's statements to them. This document has been checked and approved by the attending provider.

## 2016-06-21 ENCOUNTER — Encounter: Payer: Self-pay | Admitting: Oncology

## 2016-06-24 ENCOUNTER — Ambulatory Visit
Admission: RE | Admit: 2016-06-24 | Discharge: 2016-06-24 | Disposition: A | Discharge status: Home / Self Care | Payer: Medicare Other | Source: Ambulatory Visit | Attending: Radiation Oncology | Admitting: Radiation Oncology

## 2016-06-24 ENCOUNTER — Encounter: Payer: Self-pay | Admitting: Radiation Oncology

## 2016-06-24 VITALS — HR 78 | Temp 97.7°F | Resp 16 | Ht 64.0 in | Wt 144.4 lb

## 2016-06-24 DIAGNOSIS — Y842 Radiological procedure and radiotherapy as the cause of abnormal reaction of the patient, or of later complication, without mention of misadventure at the time of the procedure: Secondary | ICD-10-CM | POA: Insufficient documentation

## 2016-06-24 DIAGNOSIS — Z170 Estrogen receptor positive status [ER+]: Secondary | ICD-10-CM | POA: Insufficient documentation

## 2016-06-24 DIAGNOSIS — C50412 Malignant neoplasm of upper-outer quadrant of left female breast: Secondary | ICD-10-CM | POA: Diagnosis not present

## 2016-06-24 NOTE — Progress Notes (Signed)
Radiation Oncology         [phone] ________________________________  Name: Michele Green MRN: [id number]  Date: 06/24/2016  DOB: 12-22-1940  Follow-Up Visit Note  CC: [name], MD  [name], MD    ICD-9-CM ICD-10-CM   1. 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  Narrative:  The patient returns today for routine follow-up. She started Anastrozole last month and is not having difficulty with it at this time. She denies left breast pain and is applying Radiaplex to the breast. She reports a good appetite and is able to raise her left arm without difficulty. She is fatigued due to her husband's recent diagnosis of brain cancer.  ALLERGIES:  has No Known Allergies.  Meds: Current Outpatient Prescriptions  Medication Sig Dispense Refill  . allopurinol (ZYLOPRIM) 100 MG tablet Take 100 mg by mouth daily.     [name] amiodarone (PACERONE) 200 MG tablet Take 1 tablet (200 mg total) by mouth daily. 90 tablet 3  . anastrozole (ARIMIDEX) 1 MG tablet Take 1 tablet (1 mg total) by mouth daily. 30 tablet 2  . CALCIUM PO Take 1 tablet by mouth daily. Reported on 02/04/2016    . ergocalciferol (VITAMIN D2) 50000 UNITS capsule Take 50,000 Units by mouth once a week. Reported on 02/04/2016    . LIALDA 1.2 G EC tablet Take 1.2 g by mouth 3 (three) times daily.     . rivaroxaban (XARELTO) 20 MG TABS tablet Take 1 tablet (20 mg total) by mouth daily with supper. 90 tablet 3  . simvastatin (ZOCOR) 20 MG tablet Take 20 mg by mouth daily.      No current facility-administered medications for this encounter.     Physical Findings: The patient is in no acute distress. Patient is  alert and oriented.  height is 5' 4"  (1.626 m) and weight is 144 lb 6.4 oz (65.5 kg). Her oral temperature is 97.7 F (36.5 C). Her pulse is 78. Her respiration is 16 and oxygen saturation is 97%.   Lungs are clear to auscultation bilaterally. Heart has regular rate and rhythm. No palpable cervical, supraclavicular, or axillary adenopathy.  Some mild hyperpigmentation changes of the left breast. Some induration at the lumpectomy site. No dominant mass in the breast. No nipple discharge or bleeding.  Lab Findings: Lab Results  Component Value Date   WBC 4.7 02/04/2016   HGB 14.0 02/04/2016   HCT 41.5 02/04/2016   MCV 108.3 (H) 02/04/2016   PLT 143 Occ Large & giant platelets (L) 02/04/2016    Radiographic Findings: No results found.  Impression:  The patient is recovering from the effects of radiation. No sign of recurrence on clinical exam.  Plan: She is scheduled to Follow up with Dr. Burr Medico on 07/27/16 and present to Survivorship on 08/16/16. She will follow up with radiation oncology in 6 months.  ____________________________________ -----------------------------------  Blair Promise, PhD, MD  This document serves as a record of services personally performed by Gery Pray, MD. It was created on his behalf by Darcus Austin, a trained medical scribe. The creation of this record is based on the scribe's personal observations and the provider's statements to them. This  document has been checked and approved by the attending provider.

## 2016-06-24 NOTE — Progress Notes (Signed)
Michele Green   is here for a one month  follow up visit for breast cancer to right breast.  Skin status: Mild hyperpigmentation color to left breast. Lotion being used: Using Radiaplex gel to left breast. Have you seen your medical oncologist? Date If not ,when is appointment 07-27-16 When is yiur next mammogram? Has it been scheduled ? : Not scheduled yet Dr. Burr Medico to schedule later per Mrs. Facundo ER+,have started AI or Tamoxifen? If not, why?  06-14-16 started Anastrozole Discuss survivorship appointment: 08-16-16 Deneen Harts Offer referral reading material for Survivorship, [facility] and [facility] given 08-16-16 will receive  Appetite: Good Pain: None Arm mobility:Able to raise left arm without difficulty Fatigue:Having fatigue in the afternoon. Pulse 78   Temp 97.7 F (36.5 C) (Oral)   Resp 16   Ht 5' 4"  (1.626 m)   Wt 144 lb 6.4 oz (65.5 kg)   SpO2 97%   BMI 24.79 kg/m

## 2016-07-22 ENCOUNTER — Other Ambulatory Visit: Payer: Self-pay | Admitting: Family Medicine

## 2016-07-22 ENCOUNTER — Ambulatory Visit
Admission: RE | Admit: 2016-07-22 | Discharge: 2016-07-22 | Disposition: A | Discharge status: Home / Self Care | Payer: Medicare Other | Source: Ambulatory Visit | Attending: Family Medicine | Admitting: Family Medicine

## 2016-07-22 DIAGNOSIS — M5440 Lumbago with sciatica, unspecified side: Secondary | ICD-10-CM

## 2016-07-23 ENCOUNTER — Other Ambulatory Visit: Payer: Self-pay | Admitting: Family Medicine

## 2016-07-23 DIAGNOSIS — M545 Low back pain: Secondary | ICD-10-CM

## 2016-07-24 ENCOUNTER — Ambulatory Visit
Admission: RE | Admit: 2016-07-24 | Discharge: 2016-07-24 | Disposition: A | Discharge status: Home / Self Care | Payer: Medicare Other | Source: Ambulatory Visit | Attending: Family Medicine | Admitting: Family Medicine

## 2016-07-24 DIAGNOSIS — M545 Low back pain: Secondary | ICD-10-CM

## 2016-07-27 ENCOUNTER — Other Ambulatory Visit: Payer: Medicare Other

## 2016-07-27 ENCOUNTER — Ambulatory Visit: Payer: Medicare Other | Admitting: Hematology

## 2016-07-27 NOTE — Progress Notes (Deleted)
[place]  Telephone:(336) [phone] Fax:(336) [phone]  Clinic Follow Up Note   Patient Care Team: Michele Neer, MD as PCP - General (Family Medicine) 07/27/2016  CHIEF COMPLAINTS:  Follow up  left breast cancer  Oncology History   Breast cancer of upper-outer quadrant of left female breast [facility])   Staging form: Breast, AJCC 7th Edition   - Clinical stage from 01/28/2016: Stage IA (T1a, N0, M0) - Signed by Michele Merle, MD on 02/03/2016   - Pathologic stage from 02/23/2016: Stage IA (T1b, N0, cM0) - Signed by Michele Merle, MD on 06/14/2016        Breast cancer of upper-outer quadrant of left female breast ([place])   01/14/2016 Mammogram    Screening mammogram showed possible mass with distortion in the left breast.      01/23/2016 Imaging    Diagnostic mammogram and ultrasound of the left breast showed a 3-4 mm shadowing mass at 1:00 in the left breast, no adenopathy.      01/28/2016 Initial Diagnosis    Breast cancer of upper-outer quadrant of left female breast ([place])      01/28/2016 Initial Biopsy    Left breast mass biopsy showed invasive ductal carcinoma, grade 1.      01/28/2016 Receptors her2    Your 100% positive, PR 100% positive, HER-2 negative, Ki-67 5%      02/23/2016 Surgery    Left breast lumpectomy, and sentinel lymph node biopsy      02/23/2016 Pathology Results    Left breast lumpectomy showed a 1.0 cm invasive ductal carcinoma, grade 1, no lymphvascular invasion, invasive carcinoma is broadly less than 0.1 cm to the anterior inferior margin, ADH, ALH, 1 node(-). Left inferior reexcision margin showed LCIS       04/21/2016 - 05/19/2016 Radiation Therapy    adjuvant breast radiation         HISTORY OF PRESENTING ILLNESS:  Michele Green 75 y.o. female is here because of newly diagnosed left breast cancer. She is accompanied by her husband to our multidisciplinary rest clinic today.  This was discovered by screening mammo, she denies any palpable breast  mass, skin change or nipple discharge, she feels well, no complains. She remains to be physically active.   She  was found to have AF and had cardioverstion in 09/2015,  she has been on amiodarone and Xarelto since then. She also has had colitis 2-3 yeears, on liada,  she follows up with gastroenterologist Dr. Amedeo Green.  GYN HISTORY  Menarchal: 12 LMP: 48 Contraceptive: 8 HRT: 5 years, stopped when she was 89 G1P1: no breast feeding      CURRENTLY THERAPY: pending Anastrozole 20m daily, will start in a few days   INTERIM HISTORY: [name] for follow-up. She completed adjuvant breast radiation about 4 weeks ago. She tolerated it very well, with mild to moderate radiation dermatitis, which has healed well. No other issues. She otherwise is doing well, denies any significant pain, except mild arthritis pain at right knee, or other symptoms. She has been quite stressed by her husband recently diagnosed brain tumor, [name] and will start radiation soon in our cancer center.   MEDICAL HISTORY:  Past Medical History:  Diagnosis Date  . Breast cancer ([place]   . Breast cancer of upper[place] quadrant of left female breast ([facility] 02/02/2016  . Colitis   . Colon polyp 02/2012  . Dupuytren contracture    bilateral hands  . Dysrhythmia   .  Family history of breast cancer   . Hypertension   . On continuous oral anticoagulation 08/22/2015   Started on Xarelto 08/12/2015   . Osteopenia   . Persistent atrial fibrillation ([place]) 08/22/2015   Started late August or early September.   . Radiation 04/21/16-05/19/16   left breast 47.72 Gy, boosted to 10 Gy  . Wears glasses     SURGICAL HISTORY: Past Surgical History:  Procedure Laterality Date  . BREAST LUMPECTOMY WITH RADIOACTIVE SEED LOCALIZATION Left 02/23/2016   Procedure: BREAST LUMPECTOMY WITH RADIOACTIVE SEED LOCALIZATION;  Surgeon: [name], MD;  Location: [facility];  Service: General;   Laterality: Left;  . CARDIOVERSION N/A 10/02/2015   Procedure: CARDIOVERSION;  Surgeon: Jerline Pain, MD;  Location: [facility] ENDOSCOPY;  Service: Cardiovascular;  Laterality: N/A;  . COLONOSCOPY    . DUPUYTREN CONTRACTURE RELEASE  2001   leftx2  . [place]   right  . DUPUYTREN CONTRACTURE RELEASE Right 05/02/2014   Procedure: EXCISION DUPUYTRENS RIGHT PALMAR/SMALL ;  Surgeon: Cammie Sickle, MD;  Location: [facility];  Service: Orthopedics;  Laterality: Right;  . TONSILLECTOMY  age 35    SOCIAL HISTORY: Social History   Social History  . Marital status: Married    Spouse name: N/A  . Number of children: 1  . Years of education: N/A   Occupational History  . Not on file.   Social History Main Topics  . Smoking status: Former Smoker    Packs/day: 1.00    Years: 20.00    Types: Cigarettes    Quit date: 11/22/1992  . Smokeless tobacco: Never Used  . Alcohol use 6.0 oz/week    7 Glasses of wine, 3 Standard drinks or equivalent per week  . Drug use: No  . Sexual activity: No   Other Topics Concern  . Not on file   Social History Narrative  . No narrative on file    FAMILY HISTORY: Family History  Problem Relation Age of Onset  . Cirrhosis Mother   . Breast cancer Mother 71  . Heart failure Father   . Heart attack Father   . Breast cancer Sister 75  . Kidney Stones Sister     loss of kidney due to stones    ALLERGIES:  has No Known Allergies.  MEDICATIONS:  Current Outpatient Prescriptions  Medication Sig Dispense Refill  . allopurinol (ZYLOPRIM) 100 MG tablet Take 100 mg by mouth daily.     [name] amiodarone (PACERONE) 200 MG tablet Take 1 tablet (200 mg total) by mouth daily. 90 tablet 3  . anastrozole (ARIMIDEX) 1 MG tablet Take 1 tablet (1 mg total) by mouth daily. 30 tablet 2  . CALCIUM PO Take 1 tablet by mouth daily. Reported on 02/04/2016    . ergocalciferol (VITAMIN D2) 50000 UNITS capsule Take 50,000 Units by mouth  once a week. Reported on 02/04/2016    . LIALDA 1.2 G EC tablet Take 1.2 g by mouth 3 (three) times daily.     . rivaroxaban (XARELTO) 20 MG TABS tablet Take 1 tablet (20 mg total) by mouth daily with supper. 90 tablet 3  . simvastatin (ZOCOR) 20 MG tablet Take 20 mg by mouth daily.      No current facility-administered medications for this visit.     REVIEW OF SYSTEMS:   Constitutional: Denies fevers, chills or abnormal night sweats Eyes: Denies blurriness of vision, double vision or watery eyes Ears, nose, mouth, throat,  and face: Denies mucositis or sore throat Respiratory: Denies cough, dyspnea or wheezes Cardiovascular: Denies palpitation, chest discomfort or lower extremity swelling Gastrointestinal:  Denies nausea, heartburn or change in bowel habits Skin: Denies abnormal skin rashes Lymphatics: Denies new lymphadenopathy or easy bruising Neurological:Denies numbness, tingling or new weaknesses Behavioral/Psych: Mood is stable, no new changes  All other systems were reviewed with the patient and are negative.  PHYSICAL EXAMINATION: ECOG PERFORMANCE STATUS: 0 - Asymptomatic  There were no vitals filed for this visit. There were no vitals filed for this visit.  GENERAL:alert, no distress and comfortable SKIN: skin color, texture, turgor are normal, no rashes or significant lesions EYES: normal, conjunctiva are pink and non-injected, sclera clear OROPHARYNX:no exudate, no erythema and lips, buccal mucosa, and tongue normal  NECK: supple, thyroid normal size, non-tender, without nodularity LYMPH:  no palpable lymphadenopathy in the cervical, axillary or inguinal LUNGS: clear to auscultation and percussion with normal breathing effort HEART: regular rate & rhythm and no murmurs and no lower extremity edema ABDOMEN:abdomen soft, non-tender and normal bowel sounds Musculoskeletal:no cyanosis of digits and no clubbing  PSYCH: alert & oriented x 3 with fluent speech NEURO: no focal  motor/sensory deficits Breasts: Breast inspection showed them to be symmetrical with no nipple discharge.  Incision in the left breast has healed well. Mild skin pigmentation, prior skin peeling has healed.   LABORATORY DATA:  CBC Latest Ref Rng & Units 02/04/2016 05/02/2014  WBC 3.9 - 10.3 10e3/uL 4.7 -  Hemoglobin 11.6 - 15.9 g/dL 14.0 14.4  Hematocrit 34.8 - 46.6 % 41.5 -  Platelets 145 - 400 10e3/uL 143 Occ Large & giant platelets(L) -   CMP Latest Ref Rng & Units 02/18/2016 02/04/2016 09/29/2015  Glucose 65 - 99 mg/dL 94 98 92  BUN 6 - 20 mg/dL 12 14.0 11  Creatinine 0.44 - 1.00 mg/dL 0.75 0.8 0.82  Sodium 135 - 145 mmol/L 142 143 141  Potassium 3.5 - 5.1 mmol/L 3.9 3.9 4.1  Chloride 101 - 111 mmol/L 108 - 106  CO2 22 - 32 mmol/L _0 Calcium 8.9 - 10.3 mg/dL 9.3 9.4 8.9  Total Protein 6.4 - 8.3 g/dL - 7.3 -  Total Bilirubin 0.20 - 1.20 mg/dL - 0.86 -  Alkaline Phos 40 - 150 U/L - 85 -  AST 5 - 34 U/L - 44(H) -  ALT 0 - 55 U/L - 37 -    Diagnosis 02/23/2016 1. Breast, lumpectomy, left - INVASIVE DUCTAL CARCINOMA, GRADE I/III, SPANNING 1.0 CM. - INVASIVE CARCINOMA IS BROADLY LESS THAN 0.1 CM TO THE ANTERIOR/INFERIOR MARGIN OF SPECIMEN #1. - ATYPICAL DUCTAL HYPERPLASIA. - LOBULAR NEOPLASIA (ATYPICAL LOBULAR HYPERPLASIA) - ONE BENIGN LYMPH NODE (0/1). - SEE ONCOLOGY TABLE BELOW. 2. Breast, excision, left inferior - LOBULAR NEOPLASIA (LOBULAR CARCINOMA IN SITU). - SEE COMMENT. 1. 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 BREAST, INVASIVE TUMOR, WITH LYMPH NODES PRESENT Specimen, including laterality and lymph node sampling (sentinel, non-sentinel): Left breast. Procedure: Seed localized lumpectomy and additional inferior margin resection. Histologic type: Ductal. Grade: 1 Tubule formation: 1 Nuclear pleomorphism: 2 Mitotic:1 Tumor size (gross measurement): 1.0 cm Margins: Invasive, distance to closest margin: Broadly less than 0.1 cm to the  anterior/inferior margin of specimen #1, see comment. Lymphovascular invasion: Not identified. Ductal carcinoma in situ: Not identified. Lobular neoplasia: Present, lobular carcinoma in situ. Tumor focality: Unifocal. Treatment effect: N/A Extent of tumor: Confined to breast parenchyma. Lymph nodes: Examined: 0 Sentinel,  1 Non-sentinel 1 Total Lymph  nodes with metastasis: 0 Breast prognostic profile: Case [id number] Estrogen receptor: 100%, strong staining intensity Progesterone receptor: 100%, strong staining intensity Her 2 neu: No amplification was detected. The ratio was 1.35. Her 2 neu by FISH will be repeated on the current case (block 1A) and the results reported separately. Ki-67: 5% Non-neoplastic breast: Fibrocystic changes with calcifications and healing biopsy site TNM: pT1b, pN0 Comments: Immunohistochemical stains for smooth muscle myosin, calponin, and p63 support the above diagnosis. Although the invasive carcinoma is broadly less than 0.1 cm to the anterior/inferior margin of specimen #1, the separately submitted inferior margin (specimen #2) is negative. 2. The surgical resection margin(s) of the specimen were inked and microscopically evaluated. (JBK:ecj 02/27/2016) Enid Cutter MD Pathologist, Electronic Signature (Case signed 02/27/2016)  Results: HER2 - NEGATIVE RATIO OF HER2/CEP17 SIGNALS 1.54 AVERAGE HER2 COPY NUMBER PER CELL 2.00 RADIOGRAPHIC STUDIES: I have personally reviewed the radiological images as listed and agreed with the findings in the report.   Mm Diag Breast Tomo Uni Left and [place] 01/23/2016  01/23/2016  CLINICAL DATA:  75 year old female presenting for screening recall of left breast distortion. EXAM: DIGITAL DIAGNOSTIC LEFT MAMMOGRAM WITH 3D TOMOSYNTHESIS AND CAD LEFT BREAST ULTRASOUND COMPARISON:  Previous exam(s). ACR Breast Density Category c: The breast tissue is heterogeneously dense, which may obscure small masses. FINDINGS: There is a  persistent distortion in the upper-outer quadrant of the left breast in the posterior depth with a possible tiny associated mass. Mammographic images were processed with CAD. Physical exam of the upper-outer quadrant of the left breast demonstrates a small firm palpable lump at 1 o'clock, 4 cm from the nipple. Ultrasound targeted to the left breast at 1 o'clock, 4 cm from the nipple at the site of the palpable lump, there is a tiny 3-4 mm shadowing mass with possible internal blood flow on color Doppler imaging. Ultrasound of the left axilla demonstrates multiple normal-appearing lymph nodes. IMPRESSION: 1. A 3-4 mm shadowing mass is identified at 1 o'clock in the left breast, likely corresponding with the area of distortion on the mammogram. 2.  No evidence of left axillary lymphadenopathy. RECOMMENDATION: Ultrasound-guided biopsy is recommended for the shadowing mass in the left breast at 1 o'clock. This has been scheduled for 01/28/2016 at 2 p.m. I have discussed the findings and recommendations with the patient. Results were also provided in writing at the conclusion of the visit. If applicable, a reminder letter will be sent to the patient regarding the next appointment. BI-RADS CATEGORY  4: Suspicious. Electronically Signed   By: Ammie Ferrier M.D.   On: 01/23/2016 11:40   Mm Screening Breast Tomo Bilateral  01/14/2016  CLINICAL DATA:  Screening. EXAM: DIGITAL SCREENING BILATERAL MAMMOGRAM WITH 3D TOMO WITH CAD COMPARISON:  Previous exam(s). ACR Breast Density Category c: The breast tissue is heterogeneously dense, which may obscure small masses. FINDINGS: In the left breast, possible mass with distortion warrants further evaluation. In the right breast, no findings suspicious for malignancy. Images were processed with CAD. IMPRESSION: Further evaluation is suggested for possible mass with distortion in the left breast. RECOMMENDATION: Diagnostic mammogram and possibly ultrasound of the left breast.  (Code:[id number]) The patient will be contacted regarding the findings, and additional imaging will be scheduled. BI-RADS CATEGORY  0: Incomplete. Need additional imaging evaluation and/or prior mammograms for comparison. Electronically Signed   By: Lajean Manes M.D.   On: 01/14/2016 11:36     ASSESSMENT & PLAN:  75 year-old postmenopausal Caucasian woman, presented with screening this covered  left breast cancer.  1. Breast cancer of upper-outer quadrant of left female breast, G1 invasive ductal carcinoma, pT1bN0M0, stage IA, ER+/PR+/HER2- -I reviewed her surgical pathology findings with patient in details. -She has stage I a disease, low grade, likely will do very well with stroke risk of recurrence. -We discussed the risk of cancer recurrence after complete surgical resection. Giving the very small size of the tumor, low-grade, low Ki-67, ER PR positive HER-2 negative disease, the risk of recurrence is quite low. I do not recommend adjuvant chemotherapy. -She now has completed adjuvant breast radiation. -Given the strong ER and PR positivity, I recommend pt to consider adjuvant aromatase inhibitor to reduce her risk of cancer recurrence. She does have atrial fibrillation, and mitral regurgitation, I discussed the slightly increased risk of cardiovascular disease from aromatase inhibitor, in addition to the common side effects of hot flash, musculoskeletal discomfort, osteopenia and osteoporosis, etc. Patient is interested, and agreed to start in the next few days. Prescription of anastrozole was called into her pharmacy today -We also discussed breast cancer surveillance, including annual screening mammogram, self exam, routine follow-up with exam by [place].  2. HTN, AF, mitral regurgitation  -She'll continue follow-up with her primary care physician and cardiologist.  3. Genetics -Given her positive family history of breast cancer in her mother and a sister, we will refer her to genetic counseling  to ruled out inheritable breast cancer syndrome.  -She was seen by our genetic counselor, and the genetic testing was negative.  Plan -Start anastrozole 1 mg daily in a few days. Prescription was called into her pharmacy today - I'll see her back in 6 weeks for follow-up. -She is scheduled to [facility] in 2 months.   All questions were answered. The patient knows to call the clinic with any problems, questions or concerns. I spent 25 minutes counseling the patient face to face. The total time spent in the appointment was 30 minutes and more than 50% was on counseling.     Michele Merle, MD 07/27/2016

## 2016-07-28 ENCOUNTER — Other Ambulatory Visit: Payer: Self-pay | Admitting: Family Medicine

## 2016-07-28 DIAGNOSIS — S32038A Other fracture of third lumbar vertebra, initial encounter for closed fracture: Secondary | ICD-10-CM

## 2016-07-29 ENCOUNTER — Other Ambulatory Visit: Payer: Self-pay | Admitting: Interventional Cardiology

## 2016-07-29 DIAGNOSIS — I4819 Other persistent atrial fibrillation: Secondary | ICD-10-CM

## 2016-07-30 ENCOUNTER — Telehealth: Payer: Self-pay | Admitting: Interventional Cardiology

## 2016-07-30 ENCOUNTER — Telehealth: Payer: Self-pay | Admitting: *Deleted

## 2016-07-30 ENCOUNTER — Other Ambulatory Visit: Payer: Self-pay | Admitting: Adult Health

## 2016-07-30 DIAGNOSIS — C50412 Malignant neoplasm of upper-outer quadrant of left female breast: Secondary | ICD-10-CM

## 2016-07-30 MED ORDER — ANASTROZOLE 1 MG PO TABS: 1.0000 mg | ORAL_TABLET | Freq: Every day | ORAL | 0 refills | Status: DC

## 2016-07-30 NOTE — Telephone Encounter (Signed)
New message      FYI Calling to let Dr Tamala Julian know that pt fell and has a compressed fracture in her back.  She will be having surgery by Dr Rosalia Hammers [phone]) in [place].  They will be sending a clearance.  Patient's husband has brain cancer and pt tripped over his bed and fell.

## 2016-07-30 NOTE — Telephone Encounter (Signed)
Left message on Emergency Contact's number giving her our fax number.

## 2016-07-30 NOTE — Telephone Encounter (Signed)
TC from patient requesting refill on her anastrozole.  She states she is having spine surgery on 08/09/16 and she will be in rehab after that. She needs to cancel her Survivorship appt that is up coming on 08/16/16 and will call to reschedule after she is out of rehab.  Refill x1 escribed.

## 2016-07-30 NOTE — Telephone Encounter (Signed)
Message routed to [place], NP survivorship provider.

## 2016-07-30 NOTE — Telephone Encounter (Signed)
I have cancelled Ms. Guadamuz's survivorship visit with me. Thank you for refilling her anastrazole and for letting know about her upcoming surgery.  We will await her return call to reschedule when she is able.   Thanks! Mike Craze, NP [place] [phone]

## 2016-08-01 ENCOUNTER — Other Ambulatory Visit: Payer: Self-pay | Admitting: Hematology

## 2016-08-01 DIAGNOSIS — C50412 Malignant neoplasm of upper-outer quadrant of left female breast: Secondary | ICD-10-CM

## 2016-08-01 MED ORDER — ANASTROZOLE 1 MG PO TABS: 1.0000 mg | ORAL_TABLET | Freq: Every day | ORAL | 2 refills | Status: DC

## 2016-08-03 ENCOUNTER — Telehealth: Payer: Self-pay | Admitting: Interventional Cardiology

## 2016-08-03 NOTE — Telephone Encounter (Signed)
Michele Green pt's sister called regarding the surgical clearance form, that Dr. Audelia Hives Porrealba's office was supposed to have faxed to this office last Friday. Pt had   fallen and has a compress fracture in her spine. Pt is to have back surgery. Michele is aware that I checked Dr. Thompson Caul box and the surgical clearance form is not in. Also she is aware that [name] will be in this office until September 19 th. Pt's sister verbalized understanding.

## 2016-08-03 NOTE — Telephone Encounter (Signed)
Request for surgical clearance:  1. What type of surgery is being performed? Open treatment of lumbar 3 burst fracture with posterior spinal instrumentation from  L1-L5 and decompression L2-4  2. When is this surgery scheduled? 08/09/16   3. Are there any medications that need to be held prior to surgery and how long?Xareloto for 5-6 days   4. Name of physician performing surgery? Dr [facility]   5. What is your office phone and fax number? Fax # [phone] Phone # [phone] Attn: surgical coordinator   Will forward to Dr Tamala Julian for review

## 2016-08-03 NOTE — Telephone Encounter (Signed)
New Message   Seward Meth call requesting to speak with RN to f/u on surgical clearance that she states was faxed over to Cardiology office from Dr. Axel Filler office. Please call back to discuss

## 2016-08-04 NOTE — Telephone Encounter (Signed)
Hilda Blades, Surgery scheduler from Dr Sherrell Puller office at the [facility] is calling to follow-up with our office to see if Dr Tamala Julian has advised on the pts medication clearance from Xarelto needed. Pt is to have her scheduled back surgery on Monday 08/09/16.  Per Hilda Blades, this surgery must be done on Monday, for the pt is too acute to move this out, for her pain is unbearable.  Informed Hilda Blades that I will re-route this clearance needed to Dr Tamala Julian for further review, recommendation of xarelto clearance, and someone from our office will follow-up with their office thereafter.  Informed Hilda Blades that if for some reason Dr Tamala Julian is unable to advise on this, then I will take this to our Pharmacist, if available, to further advise on.  Informed Hilda Blades we will follow-up with her office when recommendations provided.  Hilda Blades verbalized understanding and agrees with this plan.

## 2016-08-04 NOTE — Telephone Encounter (Signed)
After reviewing the records, the patient is cleared to proceed with spine surgery. Please verify that the patient feels her heart is still in rhythm on amiodarone. If this is the case, it would be okay to hold anticoagulation therapy for 4 or 5 days prior to the procedure and resume as soon as possible after surgery (based on the surgeon's recommendation).

## 2016-08-04 NOTE — Telephone Encounter (Signed)
Follow UP:     Calling again,need this clearence asap. Pt is scheduled for surgery on Monday,need to stop her medicine.

## 2016-08-05 NOTE — Telephone Encounter (Signed)
Informed the pt that Dr Tamala Julian will be glad to clear her for her spine surgery scheduled for next Monday 9/18, but did want to confirm with her, that her heart is still in rhythm on amiodarone.  Per the pt, she states her heart feels like its in rhythm and she has no cardiac complaints or issues at all. Informed the pt that since she's asymptomatic from a cardiac standpoint, then Dr Tamala Julian advises that she can hold her anticoagulation therapy for 4-5 days, prior to her procedure, and resume as soon as possible after surgery, based on what her Surgeon safely recommends.  Informed the pt that her Surgeon will make the decision on when its safe for her to resume back taking her Xarelto, after her procedure.  Informed the pt that I will fax this clearance to the [facility], Dr Sherrell Puller office now.  Informed the pt that I will also speak with Hilda Blades, the surgery coordinator there, to inform her of Dr Darliss Ridgel recommendations and to follow-up with the pt thereafter.  Pt verbalized understanding, agrees with this plan, and gracious for all the assistance provided.

## 2016-08-05 NOTE — Telephone Encounter (Signed)
Spoke with Hilda Blades at the [facility], to inform her of Dr Thompson Caul recommendations on clearance for this pt, for her upcoming spine surgery on 9/18.  Per Hilda Blades, she advised to fax this clearance note to [phone], and attention it to her, Deanna, and Dr Sherlyn Lick.  Per Zettie Pho will receive this and endorse these recommendations to the pt.  Hilda Blades gracious for the prompt follow-up.

## 2016-08-11 ENCOUNTER — Other Ambulatory Visit: Payer: Medicare Other

## 2016-08-11 ENCOUNTER — Ambulatory Visit: Payer: Medicare Other | Admitting: Hematology

## 2016-08-11 DIAGNOSIS — M48061 Spinal stenosis, lumbar region without neurogenic claudication: Secondary | ICD-10-CM | POA: Insufficient documentation

## 2016-08-11 DIAGNOSIS — G9520 Unspecified cord compression: Secondary | ICD-10-CM | POA: Insufficient documentation

## 2016-08-11 DIAGNOSIS — S32032A Unstable burst fracture of third lumbar vertebra, initial encounter for closed fracture: Secondary | ICD-10-CM | POA: Insufficient documentation

## 2016-08-16 ENCOUNTER — Encounter: Payer: Medicare Other | Admitting: Adult Health

## 2016-09-06 ENCOUNTER — Ambulatory Visit (INDEPENDENT_AMBULATORY_CARE_PROVIDER_SITE_OTHER): Payer: Medicare Other

## 2016-09-06 ENCOUNTER — Ambulatory Visit (INDEPENDENT_AMBULATORY_CARE_PROVIDER_SITE_OTHER): Payer: Medicare Other | Admitting: Interventional Cardiology

## 2016-09-06 ENCOUNTER — Encounter (INDEPENDENT_AMBULATORY_CARE_PROVIDER_SITE_OTHER): Payer: Self-pay

## 2016-09-06 ENCOUNTER — Encounter: Payer: Self-pay | Admitting: Interventional Cardiology

## 2016-09-06 VITALS — BP 104/62 | HR 103 | Ht 64.0 in | Wt 131.6 lb

## 2016-09-06 DIAGNOSIS — I340 Nonrheumatic mitral (valve) insufficiency: Secondary | ICD-10-CM | POA: Diagnosis not present

## 2016-09-06 DIAGNOSIS — I4819 Other persistent atrial fibrillation: Secondary | ICD-10-CM

## 2016-09-06 DIAGNOSIS — Z79899 Other long term (current) drug therapy: Secondary | ICD-10-CM

## 2016-09-06 DIAGNOSIS — I481 Persistent atrial fibrillation: Secondary | ICD-10-CM | POA: Diagnosis not present

## 2016-09-06 DIAGNOSIS — I10 Essential (primary) hypertension: Secondary | ICD-10-CM

## 2016-09-06 DIAGNOSIS — Z7901 Long term (current) use of anticoagulants: Secondary | ICD-10-CM

## 2016-09-06 NOTE — Progress Notes (Signed)
Cardiology Office Note    Date:  09/06/2016   ID:  Michele Green, DOB 1941-06-14, MRN [id number]  PCP:  [name], MD  Cardiologist: Sinclair Grooms, MD   Chief Complaint  Patient presents with  . Atrial Fibrillation    History of Present Illness:  Michele Green is a 75 y.o. female  for f/u of persistent atrial fibrillation on Xarelto, hypertension and chronic diastolic heart failure. She had a previous electrical cardioversion on 10/02/2015. Post cardioversion course was complicated by bradycardia into the 40s to 50s, digoxin and atenolol were discontinued. She was maintained on Xarelto and amiodarone only  Today based upon exam she has recurrent asymptomatic atrial fibrillation. She is on amiodarone..   Past Medical History:  Diagnosis Date  . Breast cancer ([place])   . Breast cancer of upper-outer quadrant of left female breast ([place]) 02/02/2016  . Colitis   . Colon polyp 02/2012  . Dupuytren contracture    bilateral hands  . Dysrhythmia   . Family history of breast cancer   . Hypertension   . On continuous oral anticoagulation 08/22/2015   Started on Xarelto 08/12/2015   . Osteopenia   . Persistent atrial fibrillation ([place]) 08/22/2015   Started late August or early September.   . Radiation 04/21/16-05/19/16   left breast 47.72 Gy, boosted to 10 Gy  . Wears glasses     Past Surgical History:  Procedure Laterality Date  . BREAST LUMPECTOMY WITH RADIOACTIVE SEED LOCALIZATION Left 02/23/2016   Procedure: BREAST LUMPECTOMY WITH RADIOACTIVE SEED LOCALIZATION;  Surgeon: [name], MD;  Location: [facility];  Service: General;  Laterality: Left;  . CARDIOVERSION N/A 10/02/2015   Procedure: CARDIOVERSION;  Surgeon: Jerline Pain, MD;  Location: [facility] ENDOSCOPY;  Service: Cardiovascular;  Laterality: N/A;  . COLONOSCOPY    . DUPUYTREN CONTRACTURE RELEASE  2001   leftx2  . [place]   right  . DUPUYTREN CONTRACTURE RELEASE  Right 05/02/2014   Procedure: EXCISION DUPUYTRENS RIGHT PALMAR/SMALL ;  Surgeon: Cammie Sickle, MD;  Location: [facility];  Service: Orthopedics;  Laterality: Right;  . TONSILLECTOMY  age 36    Current Medications: Outpatient Medications Prior to Visit  Medication Sig Dispense Refill  . allopurinol (ZYLOPRIM) 100 MG tablet Take 100 mg by mouth daily.     [name] amiodarone (PACERONE) 200 MG tablet Take 1 tablet (200 mg total) by mouth daily. 90 tablet 3  . anastrozole (ARIMIDEX) 1 MG tablet Take 1 tablet (1 mg total) by mouth daily. 30 tablet 2  . CALCIUM PO Take 1 tablet by mouth daily. Reported on 02/04/2016    . ergocalciferol (VITAMIN D2) 50000 UNITS capsule Take 50,000 Units by mouth once a week. Reported on 02/04/2016    . LIALDA 1.2 G EC tablet Take 1.2 g by mouth 3 (three) times daily.     . simvastatin (ZOCOR) 20 MG tablet Take 20 mg by mouth daily.     Alveda Reasons 20 MG TABS tablet Take 1 tablet by mouth  daily with supper 90 tablet 1   No facility-administered medications prior to visit.      Allergies:   Review of patient's allergies indicates no known allergies.   Social History   Social History  . Marital status: Married    Spouse name: N/A  . Number of children: 1  . Years of education: N/A   Social History Main Topics  . Smoking status: Former  Smoker    Packs/day: 1.00    Years: 20.00    Types: Cigarettes    Quit date: 11/22/1992  . Smokeless tobacco: Never Used  . Alcohol use 6.0 oz/week    7 Glasses of wine, 3 Standard drinks or equivalent per week  . Drug use: No  . Sexual activity: No   Other Topics Concern  . None   Social History Narrative  . None     Family History:  The patient's family history includes Breast cancer (age of onset: 61) in her sister; Breast cancer (age of onset: 25) in her mother; Cirrhosis in her mother; Heart attack in her father; Heart failure in her father; Kidney Stones in her sister.   ROS:   Please see the  history of present illness.    In the interval since her last visit, she has fractured her back. She is in a rehabilitation facility. She is in rehabilitation. She speaks of mild dyspnea when she lies down. No lower extremity swelling. She did not feel there was any issue with her heart rhythm currently.  All other systems reviewed and are negative.   PHYSICAL EXAM:   VS:  BP 104/62   Pulse (!) 103   Ht 5' 4"  (1.626 m)   Wt 131 lb 9.6 oz (59.7 kg)   BMI 22.59 kg/m    GEN: Well nourished, well developed, in no acute distress  HEENT: normal  Neck: no JVD, carotid bruits, or masses Cardiac: IIRR; no murmurs, rubs, or gallops,no edema  Respiratory:  clear to auscultation bilaterally, normal work of breathing GI: soft, nontender, nondistended, + BS MS: no deformity or atrophy  Skin: warm and dry, no rash Neuro:  Alert and Oriented x 3, Strength and sensation are intact Psych: euthymic mood, full affect  Wt Readings from Last 3 Encounters:  09/06/16 131 lb 9.6 oz (59.7 kg)  06/24/16 144 lb 6.4 oz (65.5 kg)  06/14/16 143 lb 9.6 oz (65.1 kg)      Studies/Labs Reviewed:   EKG:  EKG  Not done  Recent Labs: 02/04/2016: ALT 37; HGB 14.0; Platelets 143 Occ Large & giant platelets 02/18/2016: BUN 12; Creatinine, Ser 0.75; Potassium 3.9; Sodium 142   Lipid Panel No results found for: CHOL, TRIG, HDL, CHOLHDL, VLDL, LDLCALC, LDLDIRECT  Additional studies/ records that were reviewed today include:  No new data.    ASSESSMENT:    1. Persistent atrial fibrillation ([place])   2. Non-rheumatic mitral regurgitation   3. Essential hypertension   4. On amiodarone therapy   5. On continuous oral anticoagulation      PLAN:  In order of problems listed above:  1. Recurrent atrial fibrillation despite amiodarone and prior cardioversion. Plan 48 hour Holter monitor to determine if she is having intermittent atrial fibrillation. This will also help determine whether adequate rate control is  present. We do not know how long she's been out of rhythm. 2. No change 3. Excellent control of blood pressure. 4. She is on low dose 200 mg of amiodarone daily. If she is not having intermittent atrial fibrillation I believe we will switch to rate control and not go for recurrent rhythm control. We currently have no idea of how long she has been out of rhythm. 5. Continued Xarelto.    Medication Adjustments/Labs and Tests Ordered: Current medicines are reviewed at length with the patient today.  Concerns regarding medicines are outlined above.  Medication changes, Labs and Tests ordered today are listed in the  Patient Instructions below. There are no Patient Instructions on file for this visit.   Signed, Sinclair Grooms, MD  09/06/2016 12:14 PM    [facility] [address], [place], [place]  [address] Phone: [phone]; Fax: [phone]

## 2016-09-06 NOTE — Patient Instructions (Signed)
Medication Instructions:  Your physician recommends that you continue on your current medications as directed. Please refer to the Current Medication list given to you today.   Labwork: none  Testing/Procedures: Your physician has recommended that you wear a holter monitor. Holter monitors are medical devices that record the heart's electrical activity. Doctors most often use these monitors to diagnose arrhythmias. Arrhythmias are problems with the speed or rhythm of the heartbeat. The monitor is a small, portable device. You can wear one while you do your normal daily activities. This is usually used to diagnose what is causing palpitations/syncope (passing out).    Follow-Up: Your physician recommends that you schedule a follow-up appointment in: 4-8 weeks with [name] or APP    Any Other Special Instructions Will Be Listed Below (If Applicable).     If you need a refill on your cardiac medications before your next appointment, please call your pharmacy.

## 2016-09-17 ENCOUNTER — Telehealth: Payer: Self-pay | Admitting: *Deleted

## 2016-09-17 DIAGNOSIS — I4819 Other persistent atrial fibrillation: Secondary | ICD-10-CM

## 2016-09-17 NOTE — Telephone Encounter (Signed)
-----   Message from [name], MD sent at 09/17/2016  4:36 PM EDT ----- Let the patient know she is in continuous atrial fibrillation. We should consider repeat cardioversion. Other option would be to continue medical therapy as we are, but taking amiodarone away and using other medications to slow the heart rate. Ask if she would be willing to try cardioversion once more? If so, increase amiodarone to 200 mg twice a day and set it up to be done within the next 2 weeks. Stay on Xarelto A copy will be sent to [facility], MD

## 2016-09-17 NOTE — Telephone Encounter (Signed)
Start metoprolol succ 25 mg daily. Decrease the amiodarone to 100 mg (1/2 of the 200 mg tab) for 2 weeks then stop. Start Digoxin .125 mg 1/2 tablet daily. Call if she feels worse.

## 2016-09-17 NOTE — Telephone Encounter (Signed)
Spoke with Michele Green and advised her of recommendations per [name].  Michele Green would like to try changing medications before doing another DCCV.  Michele Green states her back is broken and she just doesn't think she can do it right now.  Advised Michele Green I will send message to [name] for review and advisement. Michele Green verbalized understanding.    Michele Green is scheduled to see Bonney Leitz, PA on 11/01/16- I can move appt up if you would like for her to be seen sooner.

## 2016-09-20 NOTE — Telephone Encounter (Signed)
F/u Message  Pt call to f/u on medication that was to be sent to the pharmacy. Please call back to discuss

## 2016-09-20 NOTE — Telephone Encounter (Signed)
Spoke with pt and informed her I was waiting for clarification from [name] before I send it in to pharmacy.  Pt verbalized understanding.

## 2016-09-20 NOTE — Telephone Encounter (Addendum)
Spoke with pt and advised her of medication changes per [name].  Pt verbalized understanding and was in agreement with this plan.  Medication interaction flagged for Digoxin and Amiodarone: could cause arrhythmias (pt in afib while wearing monitor recently).  Spoke with pharmacist and she said to check with [name] to see if he wants pt to wait to start Digoxin until after she finishes Amiodarone.  Will forward to [name] for review and advisement.

## 2016-09-21 MED ORDER — DIGOXIN 125 MCG PO TABS: 0.0625 mg | ORAL_TABLET | Freq: Every day | ORAL | 3 refills | Status: DC

## 2016-09-21 MED ORDER — METOPROLOL SUCCINATE ER 25 MG PO TB24: 25.0000 mg | ORAL_TABLET | Freq: Every day | ORAL | 3 refills | Status: DC

## 2016-09-21 MED ORDER — AMIODARONE HCL 200 MG PO TABS: ORAL_TABLET | ORAL | 0 refills | Status: DC

## 2016-09-21 NOTE — Telephone Encounter (Signed)
Spoke with pt and advised her that I was going to go ahead and send in prescriptions so she can start the Metoprolol.  Advised pt not to start Digoxin until I hear back from [name] in regard to the interaction between [place].  Pt verbalized understanding and was in agreement with this plan.

## 2016-09-22 NOTE — Telephone Encounter (Signed)
[name] advised ok to start Dig afer Amio is d/c'ed.  Spoke with Michele Green and advised her of this information.  Michele Green verbalized understanding and was in agreement with this plan.

## 2016-10-09 ENCOUNTER — Other Ambulatory Visit: Payer: Self-pay | Admitting: Hematology

## 2016-10-09 DIAGNOSIS — C50412 Malignant neoplasm of upper-outer quadrant of left female breast: Secondary | ICD-10-CM

## 2016-10-18 ENCOUNTER — Telehealth: Payer: Self-pay | Admitting: Interventional Cardiology

## 2016-10-18 NOTE — Telephone Encounter (Signed)
Follow up  Pt voiced currently having a nose bleed sent call to nurse in triage.

## 2016-10-18 NOTE — Telephone Encounter (Signed)
New message     Pt c/o medication issue:  1. Name of Medication: XARELTO 20 MG TABS tablet  2. How are you currently taking this medication (dosage and times per day)? Once a day at night with meal    3. Are you having a reaction (difficulty breathing--STAT)? Nose bleed Friday night lasted for 1 hour   4. What is your medication issue? Wants to talk with nurse.

## 2016-10-18 NOTE — Telephone Encounter (Signed)
Received call transferred directly from operator and spoke with pt. She reports nose bleed this past Saturday.  States she held pressure for one hour before bleeding stopped.  No bleeding since.  Is taking Xarelto as prescribed.  No cold or allergy symptoms.  States she had similar episode last year shortly after Xarelto started but no episodes since then until this past Saturday.  Will forward to [name] for review/recommendations.

## 2016-10-18 NOTE — Telephone Encounter (Signed)
Patient needs to remain on Xarelto unless bleeding becomes much more troublesome. If she continues to have recurrent bleeding, she will need to have an ENT consult to determine if there are structural abnormalities.

## 2016-10-18 NOTE — Telephone Encounter (Signed)
Spoke with pt and informed her of recommendations per [name].  Pt verbalized understanding and will call if any further issues with nose bleeds.

## 2016-10-18 NOTE — Telephone Encounter (Signed)
I spoke with pt. She reports nose bleed started again today.  Lasted about 5 minutes.  It has now stopped.

## 2016-10-28 ENCOUNTER — Encounter: Payer: Self-pay | Admitting: Physician Assistant

## 2016-11-01 ENCOUNTER — Ambulatory Visit: Payer: Medicare Other | Admitting: Physician Assistant

## 2016-11-01 NOTE — Progress Notes (Addendum)
Cardiology Office Note    Date:  11/03/2016   ID:  Michele Green, DOB 06-Jan-1941, MRN [id number]  PCP:  Michele Neer, MD  Cardiologist: Dr. Tamala Green   CC: follow up - afib   History of Present Illness:  Michele Green is a 75 y.o. female with a history of persistent atrial fibrillation on Xarelto, MR, hypertension and chronic diastolic heart failure who presents to clinic for follow up.   She had a previous electrical cardioversion on 10/02/2015. Post cardioversion course was complicated by bradycardia into the 40s to 50s, digoxin and atenolol were discontinued. She was maintained on Xarelto and amiodarone only.  She was seen in the office on 09/06/16 by Dr. Tamala Green. She sounded like she was in atrial fib by physical exam. Phone note from 09/17/16 revealed she was put back on digoxin and Toprol.  Subsequent 48 hour Holter monitor in October showed continuous atrial fibrillation with average heart rate 106 bpm. Dr. Tamala Green offered to increase amiodarone to 263m BID and get repeat DCCV. However, patient was reluctant to repeat DCCV so amiodarone was discontinued and she was continued on Toprol XL and digoxin.   Today she presents to clinic for follow up. There was some confusion and still taking amio and not taking Toprol. No chest pain or SOB. No LE edema, orthopnea or PND. No blood in Michele stool or urine. No dizziness or syncope. No palpitations.    Past Medical History:  Diagnosis Date  . [facility]   . Breast cancer of upper-outer quadrant of left female breast ([facility] 02/02/2016  . Colitis   . Colon polyp 02/2012  . Dupuytren contracture    bilateral hands  . Dysrhythmia   . Family history of breast cancer   . Hypertension   . On continuous oral anticoagulation 08/22/2015   Started on Xarelto 08/12/2015   . Osteopenia   . Persistent atrial fibrillation ([facility] 08/22/2015   Started late August or early September.   . Radiation 04/21/16-05/19/16   left breast 47.72 Gy,  boosted to 10 Gy  . Wears glasses     Past Surgical History:  Procedure Laterality Date  . BREAST LUMPECTOMY WITH RADIOACTIVE SEED LOCALIZATION Left 02/23/2016   Procedure: BREAST LUMPECTOMY WITH RADIOACTIVE SEED LOCALIZATION;  Surgeon: BExcell Seltzer MD;  [facility]  Service: General;  Laterality: Left;  . CARDIOVERSION N/A 10/02/2015   Procedure: CARDIOVERSION;  Surgeon: MJerline Pain MD;  [facility];  Service: Cardiovascular;  Laterality: N/A;  . COLONOSCOPY    . DUPUYTREN CONTRACTURE RELEASE  2001   leftx2  . DN[place]  right  . DUPUYTREN CONTRACTURE RELEASE Right 05/02/2014   Procedure: EXCISION DUPUYTRENS RIGHT PALMAR/SMALL ;  Surgeon: RCammie Sickle MD;  [facility]  Service: Orthopedics;  Laterality: Right;  . TONSILLECTOMY  age 75   Current Medications: Outpatient Medications Prior to Visit  Medication Sig Dispense Refill  . allopurinol (ZYLOPRIM) 100 MG tablet Take 100 mg by mouth daily.     .Marland Kitchenanastrozole (ARIMIDEX) 1 MG tablet TAKE 1 TABLET BY MOUTH  DAILY 90 tablet 1  . CALCIUM PO Take 1 tablet by mouth daily. Reported on 02/04/2016    . cyclobenzaprine (FLEXERIL) 5 MG tablet Take 5 mg by mouth 3 (three) times daily as needed for muscle spasms.    . digoxin (LANOXIN) 0.125 MG tablet Take 0.5 tablets (0.0625 mg total) by mouth daily. 90 tablet 3  .  ergocalciferol (VITAMIN D2) 50000 UNITS capsule Take 50,000 Units by mouth once a week. Reported on 02/04/2016    . LIALDA 1.2 G EC tablet Take 1.2 g by mouth 3 (three) times daily.     . simvastatin (ZOCOR) 20 MG tablet Take 20 mg by mouth daily.     Alveda Reasons 20 MG TABS tablet Take 1 tablet by mouth  daily with supper 90 tablet 1  . amiodarone (PACERONE) 200 MG tablet Take 156m once daily for 2 weeks, then discontinue. 14 tablet 0  . metoprolol succinate (TOPROL-XL) 25 MG 24 hr tablet Take 1 tablet (25 mg total) by mouth daily. 90  tablet 3   No facility-administered medications prior to visit.      Allergies:   Patient has no known allergies.   Social History   Social History  . Marital status: Married    Spouse name: N/A  . Number of children: 1  . Years of education: N/A   Social History Main Topics  . Smoking status: Former Smoker    Packs/day: 1.00    Years: 20.00    Types: Cigarettes    Quit date: 11/22/1992  . Smokeless tobacco: Never Used  . Alcohol use 6.0 oz/week    7 Glasses of wine, 3 Standard drinks or equivalent per week  . Drug use: No  . Sexual activity: No   Other Topics Concern  . None   Social History Narrative  . None     Family History:  The patient's family history includes Breast cancer (age of onset: 760 in Michele Green; Breast cancer (age of onset: 843 in Michele Green; Cirrhosis in Michele Green; Heart attack in Michele father; Heart failure in Michele father; Kidney Stones in Michele Green.     ROS:   Please see the history of present illness.    ROS All other systems reviewed and are negative.   PHYSICAL EXAM:   VS:  BP 106/68   Pulse 95   Ht 5' 4"  (1.626 m)   Wt 124 lb 6.4 oz (56.4 kg)   BMI 21.35 kg/m    GEN: Well nourished, well developed, in no acute distress  HEENT: normal  Neck: no JVD, carotid bruits, or masses Cardiac: irreg irreg; no murmurs, rubs, or gallops,no edema  Respiratory:  clear to auscultation bilaterally, normal work of breathing GI: soft, nontender, nondistended, + BS MS: no deformity or atrophy  Skin: warm and dry, no rash Neuro:  Alert and Oriented x 3, Strength and sensation are intact Psych: euthymic mood, full affect  Wt Readings from Last 3 Encounters:  11/03/16 124 lb 6.4 oz (56.4 kg)  11/02/16 122 lb 9.6 oz (55.6 kg)  09/06/16 131 lb 9.6 oz (59.7 kg)      Studies/Labs Reviewed:   EKG:  EKG is ordered today.  The ekg ordered today demonstrates atrial fib HR 95 possible digitalis effect  Recent Labs: 11/02/2016: ALT 30; BUN 16.1;  Creatinine 0.9; HGB 13.0; Platelets 158; Potassium 4.3; Sodium 143   Lipid Panel No results found for: CHOL, TRIG, HDL, CHOLHDL, VLDL, LDLCALC, LDLDIRECT  Additional studies/ records that were reviewed today include:   09/14/16 48 hour holter.  Study Highlights  Continuous atrial fibrillation with average heart rate 106 bpm.  No bradycardia or pauses greater than 3 seconds.  Rare ventricular ectopy.   Continuous atrial fibrillation with poor rate control.     2D ECHO: 01/19/2016 LV EF: 60% -   65% Study Conclusions - Left  ventricle: The cavity size was normal. Wall thickness was   normal. Systolic function was normal. The estimated ejection   fraction was in the range of 60% to 65%. Left ventricular   diastolic function parameters were normal. - Mitral valve: MV is thickened, difficult to see well Cannot   exclude some prolapse.   MR appears more moderate, some coursing posterior into LA. There   was moderate regurgitation. - Left atrium: The atrium was severely dilated.   ASSESSMENT & PLAN:   Persistent atrial fibrillation: 48 hour Holter monitor in October showed continuous atrial fibrillation with average heart rate 106 bpm. Dr. Tamala Green increased amiodarone to 265m BID and offered repeat DCCV. However, patient was reluctant to repeat DCCV so amio was discontinued . 2D ECHO in Feb showed severe LAE, so maintaining NSR may be difficult. She is essentially asymptomatic with afib so we will plan a rate control strategy. She has not been taking Toprol XL 223mdaily and has continued to take amiodarone mistakenly. PLAN: STOP AMIODARONE, start toprol XL 2533maily and continue 0.0625m37mily . ECG with possible digitalis effect so will check a digoxin level.  -- Continue Xarelto for CHADSVASC of at least 5 (CHF, Age, F sex, HTN)  HTN: BP soft.  Mod MR: stable by echo in 2/203/5329ronic diastolic CHF: volume status stable.    Medication Adjustments/Labs and Tests  Ordered: Current medicines are reviewed at length with the patient today.  Concerns regarding medicines are outlined above.  Medication changes, Labs and Tests ordered today are listed in the Patient Instructions below. Patient Instructions  Medication Instructions:  Your physician has recommended you make the following change in your medication:  1.  STOP the Amiodarone 2.  START the Metoprolol    Labwork: TODAY:  DIGOXIN LEVEL  Testing/Procedures: None ordered   Follow-Up: Your physician recommends that you schedule a follow-up appointment in: 11/24/16 ARRIVE AT 1:15 FOR 1:30 APPT WITH KATIE Zay Yeargan, PA-C   Any Other Special Instructions Will Be Listed Below (If Applicable).     If you need a refill on your cardiac medications before your next appointment, please call your pharmacy.      Signed, KathAngelena Form-C  11/03/2016 11:16 AM    [facility] [id number]  [id number]: [phone]: [phone]

## 2016-11-01 NOTE — Progress Notes (Signed)
[facility]  Telephone:([phone]) [phone] Fax:(336) [phone]  Clinic Follow Up Note   Patient Care Team: [name], MD as PCP - General (Family Medicine) [name], MD as Consulting Physician (Cardiology) 11/02/2016  CHIEF COMPLAINTS:  Follow up  left breast cancer  Oncology History   Breast cancer of upper-outer quadrant of left female breast Michele Green)   Staging form: Breast, AJCC 7th Edition   - Clinical stage from 01/28/2016: Stage IA (T1a, N0, M0) - Signed by Truitt Merle, MD on 02/03/2016   - Pathologic stage from 02/23/2016: Stage IA (T1b, N0, cM0) - Signed by Truitt Merle, MD on 06/14/2016        Breast cancer of upper-outer quadrant of left female breast ([place])   01/14/2016 Mammogram    Screening mammogram showed possible mass with distortion in the left breast.      01/23/2016 Imaging    Diagnostic mammogram and ultrasound of the left breast showed a 3-4 mm shadowing mass at 1:00 in the left breast, no adenopathy.      01/28/2016 Initial Diagnosis    Breast cancer of upper-outer quadrant of left female breast (Michele Green)      01/28/2016 Initial Biopsy    Left breast mass biopsy showed invasive ductal carcinoma, grade 1.      01/28/2016 Receptors her2    Your 100% positive, PR 100% positive, HER-2 negative, Ki-67 5%      02/23/2016 Surgery    Left breast lumpectomy, and sentinel lymph node biopsy      02/23/2016 Pathology Results    Left breast lumpectomy showed a 1.0 cm invasive ductal carcinoma, grade 1, no lymphvascular invasion, invasive carcinoma is broadly less than 0.1 cm to the anterior inferior margin, ADH, ALH, 1 node(-). Left inferior reexcision margin showed LCIS       04/21/2016 - 05/19/2016 Radiation Therapy    adjuvant breast radiation       06/10/2016 -  Anti-estrogen oral therapy     Anastrozole 1 mg daily        HISTORY OF PRESENTING ILLNESS:  Michele Green 75 y.o. female is here because of newly diagnosed left breast cancer. She is  accompanied by her husband to our multidisciplinary rest clinic today.  This was discovered by screening mammo, she denies any palpable breast mass, skin change or nipple discharge, she feels well, no complains. She remains to be physically active.   She  was found to have AF and had cardioverstion in 09/2015,  she has been on amiodarone and Xarelto since then. She also has had colitis 2-3 yeears, on liada,  she follows up with gastroenterologist Dr. Amedeo Plenty.  GYN HISTORY  Menarchal: 12 LMP: 48 Contraceptive: 8 HRT: 5 years, stopped when she was 71 G1P1: no breast feeding      CURRENTLY THERAPY: Anastrozole 63m daily, started in 05/2016  INTERIM HISTORY: Michele Green for follow-up. The patient states she broke her back 3 months ago helping her husband when he fell. She states it still hurts. She wants to call her PCP and cardiologist to cancel her labs with them since she has labs today. Reports manageable hot flashes. She had physical therapy for her back and will get more PT. She reports being in rehab for 5 weeks. She is ambulatory with a walker. She reports social stress with the diagnosis of brain cancer for her husband.    MEDICAL HISTORY:  Past Medical History:  Diagnosis Date  . Breast cancer ([facility]   . Breast  cancer of upper-outer quadrant of left female breast ([place]) 02/02/2016  . Colitis   . Colon polyp 02/2012  . Dupuytren contracture    bilateral hands  . Dysrhythmia   . Family history of breast cancer   . Hypertension   . On continuous oral anticoagulation 08/22/2015   Started on Xarelto 08/12/2015   . Osteopenia   . Persistent atrial fibrillation ([place]) 08/22/2015   Started late August or early September.   . Radiation 04/21/16-05/19/16   left breast 47.72 Gy, boosted to 10 Gy  . Wears glasses     SURGICAL HISTORY: Past Surgical History:  Procedure Laterality Date  . BREAST LUMPECTOMY WITH RADIOACTIVE SEED LOCALIZATION Left 02/23/2016   Procedure: BREAST LUMPECTOMY  WITH RADIOACTIVE SEED LOCALIZATION;  Surgeon: [name], MD;  Location: [facility];  Service: General;  Laterality: Left;  . CARDIOVERSION N/A 10/02/2015   Procedure: CARDIOVERSION;  Surgeon: Jerline Pain, MD;  Location: [facility] ENDOSCOPY;  Service: Cardiovascular;  Laterality: N/A;  . COLONOSCOPY    . DUPUYTREN CONTRACTURE RELEASE  2001   leftx2  . [place]   right  . DUPUYTREN CONTRACTURE RELEASE Right 05/02/2014   Procedure: EXCISION DUPUYTRENS RIGHT PALMAR/SMALL ;  Surgeon: Cammie Sickle, MD;  Location: [facility];  Service: Orthopedics;  Laterality: Right;  . TONSILLECTOMY  age 30    SOCIAL HISTORY: Social History   Social History  . Marital status: Married    Spouse name: N/A  . Number of children: 1  . Years of education: N/A   Occupational History  . Not on file.   Social History Main Topics  . Smoking status: Former Smoker    Packs/day: 1.00    Years: 20.00    Types: Cigarettes    Quit date: 11/22/1992  . Smokeless tobacco: Never Used  . Alcohol use 6.0 oz/week    7 Glasses of wine, 3 Standard drinks or equivalent per week  . Drug use: No  . Sexual activity: No   Other Topics Concern  . Not on file   Social History Narrative  . No narrative on file    FAMILY HISTORY: Family History  Problem Relation Age of Onset  . Cirrhosis Mother   . Breast cancer Mother 51  . Heart failure Father   . Heart attack Father   . Breast cancer Sister 42  . Kidney Stones Sister     loss of kidney due to stones    ALLERGIES:  has No Known Allergies.  MEDICATIONS:  Current Outpatient Prescriptions  Medication Sig Dispense Refill  . allopurinol (ZYLOPRIM) 100 MG tablet Take 100 mg by mouth daily.     [name] amiodarone (PACERONE) 200 MG tablet Take 148m once daily for 2 weeks, then discontinue. 14 tablet 0  . anastrozole (ARIMIDEX) 1 MG tablet TAKE 1 TABLET BY MOUTH  DAILY 90 tablet 1  . CALCIUM PO Take 1 tablet  by mouth daily. Reported on 02/04/2016    . cyclobenzaprine (FLEXERIL) 5 MG tablet Take 5 mg by mouth 3 (three) times daily as needed for muscle spasms.    . digoxin (LANOXIN) 0.125 MG tablet Take 0.5 tablets (0.0625 mg total) by mouth daily. 90 tablet 3  . ergocalciferol (VITAMIN D2) 50000 UNITS capsule Take 50,000 Units by mouth once a week. Reported on 02/04/2016    . LIALDA 1.2 G EC tablet Take 1.2 g by mouth 3 (three) times daily.     . metoprolol succinate (  TOPROL-XL) 25 MG 24 hr tablet Take 1 tablet (25 mg total) by mouth daily. 90 tablet 3  . simvastatin (ZOCOR) 20 MG tablet Take 20 mg by mouth daily.     Alveda Reasons 20 MG TABS tablet Take 1 tablet by mouth  daily with supper 90 tablet 1   No current facility-administered medications for this visit.     REVIEW OF SYSTEMS: Constitutional: Denies fevers, chills or abnormal night sweats Eyes: Denies blurriness of vision, double vision or watery eyes Ears, nose, mouth, throat, and face: Denies mucositis or sore throat Respiratory: Denies cough, dyspnea or wheezes Cardiovascular: Denies palpitation, chest discomfort or lower extremity swelling Gastrointestinal:  Denies nausea, heartburn or change in bowel habits Skin: Denies abnormal skin rashes Lymphatics: Denies new lymphadenopathy or easy bruising Neurological:Denies numbness, tingling or new weaknesses Musculoskeletal: (+) Back pain Behavioral/Psych: Mood is stable, no new changes  All other systems were reviewed with the patient and are negative.  PHYSICAL EXAMINATION: ECOG PERFORMANCE STATUS:1  Vitals:   11/02/16 1151  BP: 127/76  Pulse: 86  Resp: 18  Temp: 98 F (36.7 C)   Filed Weights   11/02/16 1151  Weight: 122 lb 9.6 oz (55.6 kg)    GENERAL:alert, no distress and comfortable, she uses a walker  SKIN: skin color, texture, turgor are normal, no rashes or significant lesions EYES: normal, conjunctiva are pink and non-injected, sclera clear OROPHARYNX:no exudate,  no erythema and lips, buccal mucosa, and tongue normal  NECK: supple, thyroid normal size, non-tender, without nodularity LYMPH:  no palpable lymphadenopathy in the cervical, axillary or inguinal LUNGS: clear to auscultation and percussion with normal breathing effort HEART: regular rate & rhythm and no murmurs and no lower extremity edema ABDOMEN:abdomen soft, non-tender and normal bowel sounds Musculoskeletal:no cyanosis of digits and no clubbing. (+) Ambulatory with a walker. PSYCH: alert & oriented x 3 with fluent speech NEURO: no focal motor/sensory deficits  LABORATORY DATA:  CBC Latest Ref Rng & Units 11/02/2016 02/04/2016 05/02/2014  WBC 3.9 - 10.3 10e3/uL 5.1 4.7 -  Hemoglobin 11.6 - 15.9 g/dL 13.0 14.0 14.4  Hematocrit 34.8 - 46.6 % 39.6 41.5 -  Platelets 145 - 400 10e3/uL 158 143 Occ Large & giant platelets(L) -   CMP Latest Ref Rng & Units 11/02/2016 02/18/2016 02/04/2016  Glucose 70 - 140 mg/dl 81 94 98  BUN 7.0 - 26.0 mg/dL 16.1 12 14.0  Creatinine 0.6 - 1.1 mg/dL 0.9 0.75 0.8  Sodium 136 - 145 mEq/L 143 142 143  Potassium 3.5 - 5.1 mEq/L 4.3 3.9 3.9  Chloride 101 - 111 mmol/L - 108 -  CO2 22 - 29 mEq/L _0 Calcium 8.4 - 10.4 mg/dL 9.5 9.3 9.4  Total Protein 6.4 - 8.3 g/dL 7.0 - 7.3  Total Bilirubin 0.20 - 1.20 mg/dL 0.69 - 0.86  Alkaline Phos 40 - 150 U/L 93 - 85  AST 5 - 34 U/L 30 - 44(H)  ALT 0 - 55 U/L 30 - 37    Diagnosis 02/23/2016 1. Breast, lumpectomy, left - INVASIVE DUCTAL CARCINOMA, GRADE I/III, SPANNING 1.0 CM. - INVASIVE CARCINOMA IS BROADLY LESS THAN 0.1 CM TO THE ANTERIOR/INFERIOR MARGIN OF SPECIMEN #1. - ATYPICAL DUCTAL HYPERPLASIA. - LOBULAR NEOPLASIA (ATYPICAL LOBULAR HYPERPLASIA) - ONE BENIGN LYMPH NODE (0/1). - SEE ONCOLOGY TABLE BELOW. 2. Breast, excision, left inferior - LOBULAR NEOPLASIA (LOBULAR CARCINOMA IN SITU). - SEE COMMENT. 1. 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 BREAST, INVASIVE TUMOR, WITH LYMPH NODES  PRESENT Specimen,  including laterality and lymph node sampling (sentinel, non-sentinel): Left breast. Procedure: Seed localized lumpectomy and additional inferior margin resection. Histologic type: Ductal. Grade: 1 Tubule formation: 1 Nuclear pleomorphism: 2 Mitotic:1 Tumor size (gross measurement): 1.0 cm Margins: Invasive, distance to closest margin: Broadly less than 0.1 cm to the anterior/inferior margin of specimen #1, see comment. Lymphovascular invasion: Not identified. Ductal carcinoma in situ: Not identified. Lobular neoplasia: Present, lobular carcinoma in situ. Tumor focality: Unifocal. Treatment effect: N/A Extent of tumor: Confined to breast parenchyma. Lymph nodes: Examined: 0 Sentinel,  1 Non-sentinel 1 Total Lymph nodes with metastasis: 0 Breast prognostic profile: Case [id number] Estrogen receptor: 100%, strong staining intensity Progesterone receptor: 100%, strong staining intensity Her 2 neu: No amplification was detected. The ratio was 1.35. Her 2 neu by FISH will be repeated on the current case (block 1A) and the results reported separately. Ki-67: 5% Non-neoplastic breast: Fibrocystic changes with calcifications and healing biopsy site TNM: pT1b, pN0 Comments: Immunohistochemical stains for smooth muscle myosin, calponin, and p63 support the above diagnosis. Although the invasive carcinoma is broadly less than 0.1 cm to the anterior/inferior margin of specimen #1, the separately submitted inferior margin (specimen #2) is negative. 2. The surgical resection margin(s) of the specimen were inked and microscopically evaluated. (JBK:ecj 02/27/2016) Enid Cutter MD Pathologist, Electronic Signature (Case signed 02/27/2016)  Results: HER2 - NEGATIVE RATIO OF HER2/CEP17 SIGNALS 1.54 AVERAGE HER2 COPY NUMBER PER CELL 2.00 RADIOGRAPHIC STUDIES: I have personally reviewed the radiological images as listed and agreed with the findings in the report.   Mm Diag  Breast Tomo Uni Left and [place] 01/23/2016  01/23/2016  CLINICAL DATA:  75 year old female presenting for screening recall of left breast distortion. EXAM: DIGITAL DIAGNOSTIC LEFT MAMMOGRAM WITH 3D TOMOSYNTHESIS AND CAD LEFT BREAST ULTRASOUND COMPARISON:  Previous exam(s). ACR Breast Density Category c: The breast tissue is heterogeneously dense, which may obscure small masses. FINDINGS: There is a persistent distortion in the upper-outer quadrant of the left breast in the posterior depth with a possible tiny associated mass. Mammographic images were processed with CAD. Physical exam of the upper-outer quadrant of the left breast demonstrates a small firm palpable lump at 1 o'clock, 4 cm from the nipple. Ultrasound targeted to the left breast at 1 o'clock, 4 cm from the nipple at the site of the palpable lump, there is a tiny 3-4 mm shadowing mass with possible internal blood flow on color Doppler imaging. Ultrasound of the left axilla demonstrates multiple normal-appearing lymph nodes. IMPRESSION: 1. A 3-4 mm shadowing mass is identified at 1 o'clock in the left breast, likely corresponding with the area of distortion on the mammogram. 2.  No evidence of left axillary lymphadenopathy. RECOMMENDATION: Ultrasound-guided biopsy is recommended for the shadowing mass in the left breast at 1 o'clock. This has been scheduled for 01/28/2016 at 2 Green.m. I have discussed the findings and recommendations with the patient. Results were also provided in writing at the conclusion of the visit. If applicable, a reminder letter will be sent to the patient regarding the next appointment. BI-RADS CATEGORY  4: Suspicious. Electronically Signed   By: Ammie Ferrier M.D.   On: 01/23/2016 11:40   Mm Screening Breast Tomo Bilateral  01/14/2016  CLINICAL DATA:  Screening. EXAM: DIGITAL SCREENING BILATERAL MAMMOGRAM WITH 3D TOMO WITH CAD COMPARISON:  Previous exam(s). ACR Breast Density Category c: The breast tissue is heterogeneously dense,  which may obscure small masses. FINDINGS: In the left breast, possible mass with distortion warrants further evaluation. In  the right breast, no findings suspicious for malignancy. Images were processed with CAD. IMPRESSION: Further evaluation is suggested for possible mass with distortion in the left breast. RECOMMENDATION: Diagnostic mammogram and possibly ultrasound of the left breast. (Code:[id number]) The patient will be contacted regarding the findings, and additional imaging will be scheduled. BI-RADS CATEGORY  0: Incomplete. Need additional imaging evaluation and/or prior mammograms for comparison. Electronically Signed   By: Lajean Manes M.D.   On: 01/14/2016 11:36     ASSESSMENT & PLAN:  75 y.o. postmenopausal Caucasian woman, presented with screening this covered left breast cancer.  1. Breast cancer of upper-outer quadrant of left female breast, G1 invasive ductal carcinoma, pT1bN0M0, stage IA, ER+/PR+/HER2- -I previously reviewed her surgical pathology findings with patient in details. -She has stage I a disease, low grade, likely will do very well with stroke risk of recurrence. -We previously discussed the risk of cancer recurrence after complete surgical resection. Giving the very small size of the tumor, low-grade, low Ki-67, ER PR positive HER-2 negative disease, the risk of recurrence is quite low. I do not recommend adjuvant chemotherapy. -She has completed adjuvant breast radiation. -He is now on adjuvant endocrine therapy with anastrozole, is tolerating very well, we'll continue, plan for total 5 years. -We will continue breast cancer surveillance, including annual screening mammogram, self exam, routine follow-up with exam by [place]. -she is due for screening mammogram in February/March 2018.  2. HTN, AF, mitral regurgitation  -She'll continue follow-up with her primary care physician and cardiologist.  3. Genetics -Given her positive family history of breast cancer in her  mother and a sister, we referred her to genetic counseling to rule out inheritable breast cancer syndrome.  -She was seen by our genetic counselor, and the genetic testing was negative.  4. Spinal fracture  -she will follow up with her orthopedic surgeon -I strongly encouraged her to follow up bone density scan every 2 years, to ruled out osteopenia or osteoporosis.  Plan -continue anastrozole -Screening mammogram in February 2018 or early March. -will copy my note and labs to her cardiologist and PCP  -F/u in 4 months. -She will follow up with Dr. Brigitte Pulse for bone scan every 2 years  All questions were answered. The patient knows to call the clinic with any problems, questions or concerns.  I spent 25 minutes counseling the patient face to face. The total time spent in the appointment was 30 minutes and more than 50% was on counseling.     Truitt Merle, MD 11/02/2016   This document serves as a record of services personally performed by Truitt Merle, MD. It was created on her behalf by Darcus Austin, a trained medical scribe. The creation of this record is based on the scribe's personal observations and the provider's statements to them. This document has been checked and approved by the attending provider.

## 2016-11-02 ENCOUNTER — Encounter: Payer: Self-pay | Admitting: Hematology

## 2016-11-02 ENCOUNTER — Ambulatory Visit (HOSPITAL_BASED_OUTPATIENT_CLINIC_OR_DEPARTMENT_OTHER): Payer: Medicare Other | Admitting: Hematology

## 2016-11-02 ENCOUNTER — Other Ambulatory Visit (HOSPITAL_BASED_OUTPATIENT_CLINIC_OR_DEPARTMENT_OTHER): Payer: Medicare Other

## 2016-11-02 VITALS — BP 127/76 | HR 86 | Temp 98.0°F | Resp 18 | Ht 64.0 in | Wt 122.6 lb

## 2016-11-02 DIAGNOSIS — C50412 Malignant neoplasm of upper-outer quadrant of left female breast: Secondary | ICD-10-CM | POA: Diagnosis not present

## 2016-11-02 DIAGNOSIS — I4891 Unspecified atrial fibrillation: Secondary | ICD-10-CM | POA: Diagnosis not present

## 2016-11-02 DIAGNOSIS — I10 Essential (primary) hypertension: Secondary | ICD-10-CM

## 2016-11-02 DIAGNOSIS — Z170 Estrogen receptor positive status [ER+]: Secondary | ICD-10-CM | POA: Diagnosis not present

## 2016-11-02 DIAGNOSIS — I4819 Other persistent atrial fibrillation: Secondary | ICD-10-CM

## 2016-11-02 DIAGNOSIS — I340 Nonrheumatic mitral (valve) insufficiency: Secondary | ICD-10-CM

## 2016-11-02 LAB — CBC WITH DIFFERENTIAL/PLATELET
BASO%: 0.7 % (ref 0.0–2.0)
Basophils Absolute: 0 10e3/uL (ref 0.0–0.1)
EOS%: 0.5 % (ref 0.0–7.0)
Eosinophils Absolute: 0 10e3/uL (ref 0.0–0.5)
HCT: 39.6 % (ref 34.8–46.6)
HGB: 13 g/dL (ref 11.6–15.9)
LYMPH%: 14.2 % (ref 14.0–49.7)
MCH: 33.8 pg (ref 25.1–34.0)
MCHC: 32.8 g/dL (ref 31.5–36.0)
MCV: 103.2 fL — ABNORMAL HIGH (ref 79.5–101.0)
MONO#: 0.5 10e3/uL (ref 0.1–0.9)
MONO%: 9.1 % (ref 0.0–14.0)
NEUT#: 3.9 10e3/uL (ref 1.5–6.5)
NEUT%: 75.5 % (ref 38.4–76.8)
Platelets: 158 10e3/uL (ref 145–400)
RBC: 3.84 10e6/uL (ref 3.70–5.45)
RDW: 15.3 % — ABNORMAL HIGH (ref 11.2–14.5)
WBC: 5.1 10e3/uL (ref 3.9–10.3)
lymph#: 0.7 10e3/uL — ABNORMAL LOW (ref 0.9–3.3)

## 2016-11-02 LAB — COMPREHENSIVE METABOLIC PANEL
ALT: 30 U/L (ref 0–55)
AST: 30 U/L (ref 5–34)
Albumin: 3.4 g/dL — ABNORMAL LOW (ref 3.5–5.0)
Alkaline Phosphatase: 93 U/L (ref 40–150)
Anion Gap: 10 mEq/L (ref 3–11)
BILIRUBIN TOTAL: 0.69 mg/dL (ref 0.20–1.20)
BUN: 16.1 mg/dL (ref 7.0–26.0)
CO2: 25 meq/L (ref 22–29)
CREATININE: 0.9 mg/dL (ref 0.6–1.1)
Calcium: 9.5 mg/dL (ref 8.4–10.4)
Chloride: 108 mEq/L (ref 98–109)
EGFR: 65 mL/min/{1.73_m2} — ABNORMAL LOW (ref >90)
GLUCOSE: 81 mg/dL (ref 70–140)
Potassium: 4.3 mEq/L (ref 3.5–5.1)
SODIUM: 143 meq/L (ref 136–145)
TOTAL PROTEIN: 7 g/dL (ref 6.4–8.3)

## 2016-11-03 ENCOUNTER — Ambulatory Visit (INDEPENDENT_AMBULATORY_CARE_PROVIDER_SITE_OTHER): Payer: Medicare Other | Admitting: Physician Assistant

## 2016-11-03 ENCOUNTER — Encounter: Payer: Self-pay | Admitting: Physician Assistant

## 2016-11-03 VITALS — BP 106/68 | HR 95 | Ht 64.0 in | Wt 124.4 lb

## 2016-11-03 DIAGNOSIS — I5032 Chronic diastolic (congestive) heart failure: Secondary | ICD-10-CM

## 2016-11-03 DIAGNOSIS — I340 Nonrheumatic mitral (valve) insufficiency: Secondary | ICD-10-CM | POA: Diagnosis not present

## 2016-11-03 DIAGNOSIS — I10 Essential (primary) hypertension: Secondary | ICD-10-CM

## 2016-11-03 DIAGNOSIS — I481 Persistent atrial fibrillation: Secondary | ICD-10-CM

## 2016-11-03 DIAGNOSIS — I4819 Other persistent atrial fibrillation: Secondary | ICD-10-CM

## 2016-11-03 LAB — DIGOXIN LEVEL: Digoxin Level: 0.8 ug/L (ref 0.8–2.0)

## 2016-11-03 MED ORDER — METOPROLOL SUCCINATE ER 25 MG PO TB24: 25.0000 mg | ORAL_TABLET | Freq: Every day | ORAL | 3 refills | Status: DC

## 2016-11-03 NOTE — Patient Instructions (Addendum)
Medication Instructions:  Your physician has recommended you make the following change in your medication:  1.  STOP the Amiodarone 2.  START the Metoprolol    Labwork: TODAY:  DIGOXIN LEVEL  Testing/Procedures: None ordered   Follow-Up: Your physician recommends that you schedule a follow-up appointment in: 11/24/16 ARRIVE AT 1:15 FOR 1:30 APPT WITH KATIE THOMPSON, PA-C   Any Other Special Instructions Will Be Listed Below (If Applicable).     If you need a refill on your cardiac medications before your next appointment, please call your pharmacy.

## 2016-11-05 ENCOUNTER — Telehealth: Payer: Self-pay | Admitting: Hematology

## 2016-11-05 ENCOUNTER — Other Ambulatory Visit: Payer: Self-pay | Admitting: Hematology

## 2016-11-05 DIAGNOSIS — Z170 Estrogen receptor positive status [ER+]: Principal | ICD-10-CM

## 2016-11-05 DIAGNOSIS — C50412 Malignant neoplasm of upper-outer quadrant of left female breast: Secondary | ICD-10-CM

## 2016-11-05 NOTE — Telephone Encounter (Signed)
Spoke with patient's spouse re lab/fu 03/04/17. Schedule mailed. Message to YF re no mammo ordder.

## 2016-11-21 NOTE — Progress Notes (Signed)
Cardiology Office Note    Date:  11/24/2016   ID:  Michele Green, DOB 05-07-1941, MRN [id number]  PCP:  [name], MD  Cardiologist: [name]   CC: follow up - afib   History of Present Illness:  Michele Green is a 75 y.o. female with a history of persistent atrial fibrillation on Xarelto, mod MR, hypertension and chronic diastolic heart failure who presents to clinic for follow up.   2D ECHO 12/2015 showed EF 60-65%, mod MR, severe LAE. She was seen in the office on 09/06/16 by [name]. She sounded like she was in atrial fib by physical exam. Subsequent 48 hour Holter monitor in 08/2016 showed continuous atrial fibrillation with average heart rate 106 bpm. Patient refused repeat DCCV, so amio was discontinued and she was continued on Toprol XL and digoxin.   I saw her in clinic on 11/03/16. She was mistakenly taking amiodarone but not Toprol. I stopped amiodarone and restarted Toprol XL. I checked a digoxin level, which was normal and continued her on digoxin.    Today she presents to clinic for follow up. She is feeling fine today. No CP or SOB. No LE edema, orthopnea or PND. No dizziness or syncope. No blood in stool or urine. No palpitations. She is upset given her chronic leg pain and husband with brain cancer. She is tearful talking about. Has a lot of stress surrounding this. Glad her heart is doing well.      Past Medical History:  Diagnosis Date  . Breast cancer ([place])   . Breast cancer of upper-outer quadrant of left female breast ([place]) 02/02/2016  . Colitis   . Colon polyp 02/2012  . Dupuytren contracture    bilateral hands  . Dysrhythmia   . Family history of breast cancer   . Hypertension   . On continuous oral anticoagulation 08/22/2015   Started on Xarelto 08/12/2015   . Osteopenia   . Persistent atrial fibrillation ([place]) 08/22/2015   Started late August or early September.   . Radiation 04/21/16-05/19/16   left breast 47.72 Gy, boosted to 10 Gy  . Wears  glasses     Past Surgical History:  Procedure Laterality Date  . BREAST LUMPECTOMY WITH RADIOACTIVE SEED LOCALIZATION Left 02/23/2016   Procedure: BREAST LUMPECTOMY WITH RADIOACTIVE SEED LOCALIZATION;  Surgeon: [name], MD;  Location: [facility];  Service: General;  Laterality: Left;  . CARDIOVERSION N/A 10/02/2015   Procedure: CARDIOVERSION;  Surgeon: Jerline Pain, MD;  Location: [facility] ENDOSCOPY;  Service: Cardiovascular;  Laterality: N/A;  . COLONOSCOPY    . DUPUYTREN CONTRACTURE RELEASE  2001   leftx2  . [place]   right  . DUPUYTREN CONTRACTURE RELEASE Right 05/02/2014   Procedure: EXCISION DUPUYTRENS RIGHT PALMAR/SMALL ;  Surgeon: Cammie Sickle, MD;  Location: [place];  Service: Orthopedics;  Laterality: Right;  . TONSILLECTOMY  age 7    Current Medications: Outpatient Medications Prior to Visit  Medication Sig Dispense Refill  . allopurinol (ZYLOPRIM) 100 MG tablet Take 100 mg by mouth daily.     [name] anastrozole (ARIMIDEX) 1 MG tablet TAKE 1 TABLET BY MOUTH  DAILY 90 tablet 1  . CALCIUM PO Take 1 tablet by mouth daily. Reported on 02/04/2016    . cyclobenzaprine (FLEXERIL) 5 MG tablet Take 5 mg by mouth 3 (three) times daily as needed for muscle spasms.    . digoxin (LANOXIN) 0.125 MG tablet Take 0.5 tablets (  0.0625 mg total) by mouth daily. 90 tablet 3  . ergocalciferol (VITAMIN D2) 50000 UNITS capsule Take 50,000 Units by mouth once a week. Reported on 02/04/2016    . LIALDA 1.2 G EC tablet Take 1.2 g by mouth 3 (three) times daily.     . metoprolol succinate (TOPROL-XL) 25 MG 24 hr tablet Take 1 tablet (25 mg total) by mouth daily. 90 tablet 3  . simvastatin (ZOCOR) 20 MG tablet Take 20 mg by mouth daily.     Alveda Reasons 20 MG TABS tablet Take 1 tablet by mouth  daily with supper 90 tablet 1   No facility-administered medications prior to visit.      Allergies:   Patient has no known allergies.   Social  History   Social History  . Marital status: Married    Spouse name: N/A  . Number of children: 1  . Years of education: N/A   Social History Main Topics  . Smoking status: Former Smoker    Packs/day: 1.00    Years: 20.00    Types: Cigarettes    Quit date: 11/22/1992  . Smokeless tobacco: Never Used  . Alcohol use 6.0 oz/week    7 Glasses of wine, 3 Standard drinks or equivalent per week  . Drug use: No  . Sexual activity: No   Other Topics Concern  . None   Social History Narrative  . None     Family History:  The patient's family history includes Breast cancer (age of onset: 37) in her sister; Breast cancer (age of onset: 75) in her mother; Cirrhosis in her mother; Heart attack in her father; Heart failure in her father; Kidney Stones in her sister.     ROS:   Please see the history of present illness.    ROS All other systems reviewed and are negative.   PHYSICAL EXAM:   VS:  BP 130/74   Pulse 88   Ht 5' 4"  (1.626 m)   Wt 126 lb 1.9 oz (57.2 kg)   BMI 21.65 kg/m    GEN: Well nourished, well developed, in no acute distress  HEENT: normal  Neck: no JVD, carotid bruits, or masses Cardiac: irreg irreg; no murmurs, rubs, or gallops,no edema  Respiratory:  clear to auscultation bilaterally, normal work of breathing GI: soft, nontender, nondistended, + BS MS: no deformity or atrophy  Skin: warm and dry, no rash Neuro:  Alert and Oriented x 3, Strength and sensation are intact Psych: euthymic mood, full affect  Wt Readings from Last 3 Encounters:  11/24/16 126 lb 1.9 oz (57.2 kg)  11/03/16 124 lb 6.4 oz (56.4 kg)  11/02/16 122 lb 9.6 oz (55.6 kg)      Studies/Labs Reviewed:   EKG:  EKG is ordered today.  The ekg ordered today demonstrates atrial fib HR 88 with non specific ST/TW changes   Recent Labs: 11/02/2016: ALT 30; BUN 16.1; Creatinine 0.9; HGB 13.0; Platelets 158; Potassium 4.3; Sodium 143   Lipid Panel No results found for: CHOL, TRIG, HDL, CHOLHDL,  VLDL, LDLCALC, LDLDIRECT  Additional studies/ records that were reviewed today include:   09/14/16 48 hour holter.  Study Highlights  Continuous atrial fibrillation with average heart rate 106 bpm.  No bradycardia or pauses greater than 3 seconds.  Rare ventricular ectopy.   Continuous atrial fibrillation with poor rate control.     2D ECHO: 01/19/2016 LV EF: 60% -   65% Study Conclusions - Left ventricle: The cavity size was  normal. Wall thickness was   normal. Systolic function was normal. The estimated ejection   fraction was in the range of 60% to 65%. Left ventricular   diastolic function parameters were normal. - Mitral valve: MV is thickened, difficult to see well Cannot   exclude some prolapse.   MR appears more moderate, some coursing posterior into LA. There   was moderate regurgitation. - Left atrium: The atrium was severely dilated.   ASSESSMENT & PLAN:   Persistent atrial fibrillation: ECG with continued afib. HR well controlled on Toprol XL 39m daily and Digoxin 0.06234mdaily. Continue Xarelto for CHADSVASC of at least 5 (CHF, Age, F sex, HTN). No changes made today  HTN: BP well controlled today.  Mod MR: stable by echo in 2/8/2707Chronic diastolic CHF: volume status stable.    Medication Adjustments/Labs and Tests Ordered: Current medicines are reviewed at length with the patient today.  Concerns regarding medicines are outlined above.  Medication changes, Labs and Tests ordered today are listed in the Patient Instructions below. Patient Instructions  Medication Instructions:  Same-no changes  Labwork: None  Testing/Procedures: None  Follow-Up: Your physician wants you to follow-up in: 6 months. You will receive a reminder letter in the mail two months in advance. If you don't receive a letter, please call our office to schedule the follow-up appointment.     If you need a refill on your cardiac medications before your next appointment,  please call your pharmacy.      Signed, KaAngelena FormPA-C  11/24/2016 2:28 PM    [facility] [address] [place]  [id number]: [phone]: [phone]

## 2016-11-24 ENCOUNTER — Ambulatory Visit (INDEPENDENT_AMBULATORY_CARE_PROVIDER_SITE_OTHER): Payer: Medicare Other | Admitting: Physician Assistant

## 2016-11-24 ENCOUNTER — Encounter: Payer: Self-pay | Admitting: Physician Assistant

## 2016-11-24 ENCOUNTER — Encounter (INDEPENDENT_AMBULATORY_CARE_PROVIDER_SITE_OTHER): Payer: Self-pay

## 2016-11-24 VITALS — BP 130/74 | HR 88 | Ht 64.0 in | Wt 126.1 lb

## 2016-11-24 DIAGNOSIS — I5032 Chronic diastolic (congestive) heart failure: Secondary | ICD-10-CM

## 2016-11-24 DIAGNOSIS — I481 Persistent atrial fibrillation: Secondary | ICD-10-CM | POA: Diagnosis not present

## 2016-11-24 DIAGNOSIS — I4819 Other persistent atrial fibrillation: Secondary | ICD-10-CM

## 2016-11-24 DIAGNOSIS — I340 Nonrheumatic mitral (valve) insufficiency: Secondary | ICD-10-CM | POA: Diagnosis not present

## 2016-11-24 DIAGNOSIS — I10 Essential (primary) hypertension: Secondary | ICD-10-CM | POA: Diagnosis not present

## 2016-11-24 MED ORDER — RIVAROXABAN 20 MG PO TABS: ORAL_TABLET | ORAL | 3 refills | Status: DC

## 2016-11-24 NOTE — Patient Instructions (Signed)
**Note De-identified  Obfuscation** Medication Instructions:  Same-no changes  Labwork: None  Testing/Procedures: None  Follow-Up: Your physician wants you to follow-up in: 6 months. You will receive a reminder letter in the mail two months in advance. If you don't receive a letter, please call our office to schedule the follow-up appointment.      If you need a refill on your cardiac medications before your next appointment, please call your pharmacy.   

## 2016-12-30 ENCOUNTER — Ambulatory Visit
Admission: RE | Admit: 2016-12-30 | Discharge: 2016-12-30 | Disposition: A | Discharge status: Home / Self Care | Payer: Medicare Other | Source: Ambulatory Visit | Attending: Radiation Oncology | Admitting: Radiation Oncology

## 2016-12-30 ENCOUNTER — Encounter: Payer: Self-pay | Admitting: Radiation Oncology

## 2016-12-30 VITALS — BP 116/75 | HR 73 | Temp 98.4°F | Resp 18 | Ht 64.0 in | Wt 124.0 lb

## 2016-12-30 DIAGNOSIS — Z79899 Other long term (current) drug therapy: Secondary | ICD-10-CM | POA: Diagnosis not present

## 2016-12-30 DIAGNOSIS — C50412 Malignant neoplasm of upper-outer quadrant of left female breast: Secondary | ICD-10-CM | POA: Insufficient documentation

## 2016-12-30 DIAGNOSIS — Z7901 Long term (current) use of anticoagulants: Secondary | ICD-10-CM | POA: Insufficient documentation

## 2016-12-30 DIAGNOSIS — Z170 Estrogen receptor positive status [ER+]: Secondary | ICD-10-CM | POA: Diagnosis not present

## 2016-12-30 DIAGNOSIS — Z08 Encounter for follow-up examination after completed treatment for malignant neoplasm: Secondary | ICD-10-CM | POA: Insufficient documentation

## 2016-12-30 DIAGNOSIS — Z923 Personal history of irradiation: Secondary | ICD-10-CM | POA: Diagnosis not present

## 2016-12-30 NOTE — Progress Notes (Signed)
Michele Green is here for a follow up visit for left breast cancer radiation completed 05-19-16. Skin to left breast left breast normal color and intact, used Radiaplex gel and then she used a lotion with vitamin E. Denies any lymphedema to the left arm and able to raise the left arm without any difficulty.  Dr. Burr Medico to make an appointment for a mammogram for 2018. Started Anasrtrozole in 07-2016.  Appetite has not been the best eating two meals per day. Wt Readings from Last 3 Encounters:  12/30/16 124 lb (56.2 kg)  11/24/16 126 lb 1.9 oz (57.2 kg)  11/03/16 124 lb 6.4 oz (56.4 kg)  Fell and broke her back in July taking out the trash and the next day her husband fell and she helped him up and made the fracture worse, had back surgery. BP 116/75   Pulse 73   Temp 98.4 F (36.9 C) (Oral)   Resp 18   Ht 5' 4"  (1.626 m)   Wt 124 lb (56.2 kg)   SpO2 95%   BMI 21.28 kg/m

## 2016-12-30 NOTE — Progress Notes (Signed)
Radiation Oncology         ([phone]) [phone] ________________________________  Name: Michele Green MRN: [id number]  Date: 12/30/2016  DOB: 12/16/1940   Follow-Up Visit Note  CC: [name], MD  [name], MD    ICD-9-CM ICD-10-CM   1. 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  Narrative:  The patient returns today for routine follow-up of radiation completed 05/19/16.  On review of systems, the patient denies any lymphedema to the left arm, and reports she can raise her arm without difficulty. The patient finished using Radiaplex gel and has started using Vitamin E oil lotion to the skin over the treatment area. She reports her appetite has not been the best, and she is currently eating 2 meals a day. The patient reports she started Anastrozole in September 2017. She reports Dr. Burr Medico is making her an appointment for a mammogram.  Of note, the patient reports she broke her back in July 2017 after a fall, and subsequently made the fracture worse when her husband fell and she tried to help him up. She had back surgery in 2017.  ALLERGIES:  has No Known Allergies.  Meds: Current Outpatient Prescriptions  Medication Sig Dispense Refill  . allopurinol (ZYLOPRIM) 100 MG tablet Take 100 mg by mouth daily.     [name] anastrozole (ARIMIDEX) 1 MG tablet TAKE 1 TABLET BY MOUTH  DAILY 90 tablet 1  . CALCIUM PO Take 1 tablet by mouth daily. Reported on 02/04/2016    . cyclobenzaprine (FLEXERIL) 5 MG tablet Take 5 mg by mouth 3 (three) times daily as needed for muscle spasms.    . digoxin (LANOXIN) 0.125 MG tablet Take 0.5 tablets (0.0625 mg  total) by mouth daily. 90 tablet 3  . ergocalciferol (VITAMIN D2) 50000 UNITS capsule Take 50,000 Units by mouth once a week. Reported on 02/04/2016    . LIALDA 1.2 G EC tablet Take 1.2 g by mouth 3 (three) times daily.     . metoprolol succinate (TOPROL-XL) 25 MG 24 hr tablet Take 1 tablet (25 mg total) by mouth daily. 90 tablet 3  . rivaroxaban (XARELTO) 20 MG TABS tablet Take 1 tablet by mouth  daily with supper 90 tablet 3  . simvastatin (ZOCOR) 20 MG tablet Take 20 mg by mouth daily.      No current facility-administered medications for this encounter.     Physical Findings: The patient is in no acute distress. Patient is alert and oriented.  height is 5' 4"  (1.626 m) and weight is 124 lb (56.2 kg). Her oral temperature is 98.4 F (36.9 C). Her blood pressure is 116/75 and her pulse is 73. Her respiration is 18 and oxygen saturation is 95%.   Lungs are clear to auscultation bilaterally. Heart has regular rate and rhythm. No palpable cervical, supraclavicular, or axillary adenopathy. Abdomen is soft and non tender, non distended, with normal active bowel sounds. Excellent cosmetic result with no significant radiation changes noted. No dominant mass in the left breast. No nipple discharge or bleeding. No masses noted in the right breast.  Lab Findings: Lab Results  Component Value Date   WBC 5.1  11/02/2016   HGB 13.0 11/02/2016   HCT 39.6 11/02/2016   MCV 103.2 (H) 11/02/2016   PLT 158 11/02/2016    Radiographic Findings: No results found.  Impression:  The patient is recovering from the effects of radiation. No evidence of disease recurrence on clinical exam.  Plan: She will follow up with radiation oncology On a when necessary basis but will continue regular follow-up with medical oncology and surgery.  -----------------------------------  Blair Promise, PhD, MD  This document serves as a record of services personally performed by Gery Pray, MD. It was created on his  behalf by Maryla Morrow, a trained medical scribe. The creation of this record is based on the scribe's personal observations and the provider's statements to them. This document has been checked and approved by the attending provider.

## 2017-01-06 ENCOUNTER — Encounter (HOSPITAL_COMMUNITY): Payer: Self-pay

## 2017-01-28 ENCOUNTER — Other Ambulatory Visit: Payer: Self-pay

## 2017-01-28 DIAGNOSIS — Z853 Personal history of malignant neoplasm of breast: Secondary | ICD-10-CM

## 2017-01-31 ENCOUNTER — Telehealth: Payer: Self-pay | Admitting: Hematology

## 2017-01-31 NOTE — Telephone Encounter (Signed)
Left message for patient re mammo at [facility] 3/14.

## 2017-02-03 ENCOUNTER — Ambulatory Visit
Admission: RE | Admit: 2017-02-03 | Discharge: 2017-02-03 | Disposition: A | Discharge status: Home / Self Care | Payer: Medicare Other | Source: Ambulatory Visit | Attending: Hematology | Admitting: Hematology

## 2017-02-03 DIAGNOSIS — Z170 Estrogen receptor positive status [ER+]: Principal | ICD-10-CM

## 2017-02-03 DIAGNOSIS — C50412 Malignant neoplasm of upper-outer quadrant of left female breast: Secondary | ICD-10-CM

## 2017-02-25 ENCOUNTER — Other Ambulatory Visit: Payer: Self-pay | Admitting: *Deleted

## 2017-02-25 DIAGNOSIS — Z170 Estrogen receptor positive status [ER+]: Principal | ICD-10-CM

## 2017-02-25 DIAGNOSIS — C50412 Malignant neoplasm of upper-outer quadrant of left female breast: Secondary | ICD-10-CM

## 2017-02-25 MED ORDER — ANASTROZOLE 1 MG PO TABS: 1.0000 mg | ORAL_TABLET | Freq: Every day | ORAL | 1 refills | Status: DC

## 2017-03-02 NOTE — Progress Notes (Signed)
[facility]  [phone] [phone]  Clinic Follow Up Note   Patient Care Team: Michele Neer, MD as PCP - General (Family Medicine) Michele Crome, MD as Consulting Physician (Cardiology) 03/04/2017  CHIEF COMPLAINTS:  Follow up  left breast cancer  Oncology History   Breast cancer of upper-outer quadrant of left female breast [facility])   Staging form: Breast, AJCC 7th Edition   - Clinical stage from 01/28/2016: Stage IA (T1a, N0, M0) - Signed by Michele Merle, MD on 02/03/2016   - Pathologic stage from 02/23/2016: Stage IA (T1b, N0, cM0) - Signed by Michele Merle, MD on 06/14/2016        Breast cancer of upper-outer quadrant of left female breast ([place])   01/14/2016 Mammogram    Screening mammogram showed possible mass with distortion in the left breast.      01/23/2016 Imaging    Diagnostic mammogram and ultrasound of the left breast showed a 3-4 mm shadowing mass at 1:00 in the left breast, no adenopathy.      01/28/2016 Initial Diagnosis    Breast cancer of upper-outer quadrant of left female breast ([place])      01/28/2016 Initial Biopsy    Left breast mass biopsy showed invasive ductal carcinoma, grade 1.      01/28/2016 Receptors her2    Your 100% positive, PR 100% positive, HER-2 negative, Ki-67 5%      02/23/2016 Surgery    Left breast lumpectomy, and sentinel lymph node biopsy      02/23/2016 Pathology Results    Left breast lumpectomy showed a 1.0 cm invasive ductal carcinoma, grade 1, no lymphvascular invasion, invasive carcinoma is broadly less than 0.1 cm to the anterior inferior margin, ADH, ALH, 1 node(-). Left inferior reexcision margin showed LCIS       04/21/2016 - 05/19/2016 Radiation Therapy    adjuvant breast radiation       06/10/2016 -  Anti-estrogen oral therapy     Anastrozole 1 mg daily      02/03/2017 Mammogram    Bilateral diagnostic mammogram 02/03/17 IMPRESSION: Expected surgical changes in the upper-outer quadrant of the  left breast. No mammographic evidence of malignancy in the bilateral breasts.        HISTORY OF PRESENTING ILLNESS:  Michele Green 76 y.o. female is here because of newly diagnosed left breast cancer. She is accompanied by her husband to our multidisciplinary rest clinic today.  This was discovered by screening mammo, she denies any palpable breast mass, skin change or nipple discharge, she feels well, no complains. She remains to be physically active.   She  was found to have AF and had cardioverstion in 09/2015,  she has been on amiodarone and Xarelto since then. She also has had colitis 2-3 yeears, on liada,  she follows up with gastroenterologist Dr. Amedeo Green.  GYN HISTORY  Menarchal: 12 LMP: 48 Contraceptive: 8 HRT: 5 years, stopped when she was 68 G1P1: no breast feeding      CURRENTLY THERAPY: Anastrozole 35m daily, started in 05/2016  INTERIM HISTORY: [name] for follow-up. She is doing well overall. She has been tolerating anastrozole very well, mild hot flash, no other significant side effects. She hasn't seen her primary care physician Dr. SBrigitte Green, or recommended to her to start Aredia injection every 6 months. She denies any significant Green, or other symptoms.  MEDICAL HISTORY:  Past Medical History:  Diagnosis Date  . Breast cancer ([place]   . Breast cancer  of upper-outer quadrant of left female breast ([place]) 02/02/2016  . Colitis   . Colon polyp 02/2012  . Dupuytren contracture    bilateral hands  . Dysrhythmia   . Family history of breast cancer   . Hypertension   . On continuous oral anticoagulation 08/22/2015   Started on Xarelto 08/12/2015   . Osteopenia   . Persistent atrial fibrillation ([place]) 08/22/2015   Started late August or early September.   . Radiation 04/21/16-05/19/16   left breast 47.72 Gy, boosted to 10 Gy  . Wears glasses     SURGICAL HISTORY: Past Surgical History:  Procedure Laterality Date  . BREAST LUMPECTOMY WITH RADIOACTIVE  SEED LOCALIZATION Left 02/23/2016   Procedure: BREAST LUMPECTOMY WITH RADIOACTIVE SEED LOCALIZATION;  Surgeon: Michele Seltzer, MD;  Location: [facility];  Service: General;  Laterality: Left;  . CARDIOVERSION N/A 10/02/2015   Procedure: CARDIOVERSION;  Surgeon: Michele Pain, MD;  Location: [facility] ENDOSCOPY;  Service: Cardiovascular;  Laterality: N/A;  . COLONOSCOPY    . DUPUYTREN CONTRACTURE RELEASE  2001   leftx2  . [place]   right  . DUPUYTREN CONTRACTURE RELEASE Right 05/02/2014   Procedure: EXCISION DUPUYTRENS RIGHT PALMAR/SMALL ;  Surgeon: Cammie Sickle, MD;  Location: [facility];  Service: Orthopedics;  Laterality: Right;  . TONSILLECTOMY  age 3    SOCIAL HISTORY: Social History   Social History  . Marital status: Married    Spouse name: N/A  . Number of children: 1  . Years of education: N/A   Occupational History  . Not on file.   Social History Main Topics  . Smoking status: Former Smoker    Packs/day: 1.00    Years: 20.00    Types: Cigarettes    Quit date: 11/22/1992  . Smokeless tobacco: Never Used  . Alcohol use 6.0 oz/week    7 Glasses of wine, 3 Standard drinks or equivalent per week  . Drug use: No  . Sexual activity: No   Other Topics Concern  . Not on file   Social History Narrative  . No narrative on file    FAMILY HISTORY: Family History  Problem Relation Age of Onset  . Cirrhosis Mother   . Breast cancer Mother 79  . Heart failure Father   . Heart attack Father   . Breast cancer Sister 27  . Kidney Stones Sister     loss of kidney due to stones    ALLERGIES:  has No Known Allergies.  MEDICATIONS:  Current Outpatient Prescriptions  Medication Sig Dispense Refill  . allopurinol (ZYLOPRIM) 100 MG tablet Take 100 mg by mouth daily.     [name] anastrozole (ARIMIDEX) 1 MG tablet Take 1 tablet (1 mg total) by mouth daily. 90 tablet 1  . CALCIUM PO Take 1 tablet by mouth daily. Reported on  02/04/2016    . cyclobenzaprine (FLEXERIL) 5 MG tablet Take 5 mg by mouth 3 (three) times daily as needed for muscle spasms.    . digoxin (LANOXIN) 0.125 MG tablet Take 0.5 tablets (0.0625 mg total) by mouth daily. 90 tablet 3  . ergocalciferol (VITAMIN D2) 50000 UNITS capsule Take 50,000 Units by mouth once a week. Reported on 02/04/2016    . LIALDA 1.2 G EC tablet Take 1.2 g by mouth 3 (three) times daily.     . metoprolol succinate (TOPROL-XL) 25 MG 24 hr tablet Take 1 tablet (25 mg total) by mouth daily. 90 tablet 3  .  rivaroxaban (XARELTO) 20 MG TABS tablet Take 1 tablet by mouth  daily with supper 90 tablet 3  . simvastatin (ZOCOR) 20 MG tablet Take 20 mg by mouth daily.     . sertraline (ZOLOFT) 50 MG tablet Take 50 mg by mouth daily.     No current facility-administered medications for this visit.     REVIEW OF SYSTEMS: Constitutional: Denies fevers, chills or abnormal night sweats Eyes: Denies blurriness of vision, double vision or watery eyes Ears, nose, mouth, throat, and face: Denies mucositis or sore throat Respiratory: Denies cough, dyspnea or wheezes Cardiovascular: Denies palpitation, chest discomfort or lower extremity swelling Gastrointestinal:  Denies nausea, heartburn or change in bowel habits Skin: Denies abnormal skin rashes Lymphatics: Denies new lymphadenopathy or easy bruising Neurological:Denies numbness, tingling or new weaknesses Musculoskeletal: (+) Back Green Behavioral/Psych: Mood is stable, no new changes  All other systems were reviewed with the patient and are negative.  PHYSICAL EXAMINATION: ECOG PERFORMANCE STATUS:1  Vitals:   03/04/17 1304  BP: 118/77  Pulse: (!) 115  Resp: 18  Temp: 98 F (36.7 C)   Filed Weights   03/04/17 1304  Weight: 124 lb 9.6 oz (56.5 kg)    GENERAL:alert, no distress and comfortable, she uses a walker  SKIN: skin color, texture, turgor are normal, no rashes or significant lesions EYES: normal, conjunctiva are pink  and non-injected, sclera clear OROPHARYNX:no exudate, no erythema and lips, buccal mucosa, and tongue normal  NECK: supple, thyroid normal size, non-tender, without nodularity LYMPH:  no palpable lymphadenopathy in the cervical, axillary or inguinal LUNGS: clear to auscultation and percussion with normal breathing effort HEART: regular rate & rhythm and no murmurs and no lower extremity edema ABDOMEN:abdomen soft, non-tender and normal bowel sounds Musculoskeletal:no cyanosis of digits and no clubbing. (+) Ambulatory with a walker. PSYCH: alert & oriented x 3 with fluent speech NEURO: no focal motor/sensory deficits  LABORATORY DATA:  CBC Latest Ref Rng & Units 03/04/2017 11/02/2016 02/04/2016  WBC 3.9 - 10.3 10e3/uL 4.0 5.1 4.7  Hemoglobin 11.6 - 15.9 g/dL 13.3 13.0 14.0  Hematocrit 34.8 - 46.6 % 38.7 39.6 41.5  Platelets 145 - 400 10e3/uL 146 158 143 Occ Large & giant platelets(L)   CMP Latest Ref Rng & Units 03/04/2017 11/02/2016 02/18/2016  Glucose 70 - 140 mg/dl 90 81 94  BUN 7.0 - 26.0 mg/dL 14.3 16.1 12  Creatinine 0.6 - 1.1 mg/dL 0.8 0.9 0.75  Sodium 136 - 145 mEq/L 141 143 142  Potassium 3.5 - 5.1 mEq/L 5.1 4.3 3.9  Chloride 101 - 111 mmol/L - - 108  CO2 22 - 29 mEq/L 26 25 23   Calcium 8.4 - 10.4 mg/dL 10.0 9.5 9.3  Total Protein 6.4 - 8.3 g/dL 7.0 7.0 -  Total Bilirubin 0.20 - 1.20 mg/dL 0.74 0.69 -  Alkaline Phos 40 - 150 U/L 85 93 -  AST 5 - 34 U/L 24 30 -  ALT 0 - 55 U/L 15 30 -    Diagnosis 02/23/2016 1. Breast, lumpectomy, left - INVASIVE DUCTAL CARCINOMA, GRADE I/III, SPANNING 1.0 CM. - INVASIVE CARCINOMA IS BROADLY LESS THAN 0.1 CM TO THE ANTERIOR/INFERIOR MARGIN OF SPECIMEN #1. - ATYPICAL DUCTAL HYPERPLASIA. - LOBULAR NEOPLASIA (ATYPICAL LOBULAR HYPERPLASIA) - ONE BENIGN LYMPH NODE (0/1). - SEE ONCOLOGY TABLE BELOW. 2. Breast, excision, left inferior - LOBULAR NEOPLASIA (LOBULAR CARCINOMA IN SITU). - SEE COMMENT. 1. FLUORESCENCE IN-SITU  HYBRIDIZATION Microscopic Comment 1. BREAST, INVASIVE TUMOR, WITH LYMPH NODES PRESENT Specimen, including laterality and  lymph node sampling (sentinel, non-sentinel): Left breast. Procedure: Seed localized lumpectomy and additional inferior margin resection. Histologic type: Ductal. Grade: 1 Tubule formation: 1 Nuclear pleomorphism: 2 Mitotic:1 Tumor size (gross measurement): 1.0 cm Margins: Invasive, distance to closest margin: Broadly less than 0.1 cm to the anterior/inferior margin of specimen #1, see comment. Lymphovascular invasion: Not identified. Ductal carcinoma in situ: Not identified. Lobular neoplasia: Present, lobular carcinoma in situ. Tumor focality: Unifocal. Treatment effect: N/A Extent of tumor: Confined to breast parenchyma. Lymph nodes: Examined: 0 Sentinel,  1 Non-sentinel 1 Total Lymph nodes with metastasis: 0 Breast prognostic profile: Case [id number] Estrogen receptor: 100%, strong staining intensity Progesterone receptor: 100%, strong staining intensity Her 2 neu: No amplification was detected. The ratio was 1.35. Her 2 neu by FISH will be repeated on the current case (block 1A) and the results reported separately. Ki-67: 5% Non-neoplastic breast: Fibrocystic changes with calcifications and healing biopsy site TNM: pT1b, pN0 Comments: Immunohistochemical stains for smooth muscle myosin, calponin, and p63 support the above diagnosis. Although the invasive carcinoma is broadly less than 0.1 cm to the anterior/inferior margin of specimen #1, the separately submitted inferior margin (specimen #2) is negative. 2. The surgical resection margin(Michele) of the specimen were inked and microscopically evaluated. (JBK:ecj 02/27/2016) Enid Cutter MD Pathologist, Electronic Signature (Case signed 02/27/2016)  Results: HER2 - NEGATIVE RATIO OF HER2/CEP17 SIGNALS 1.54 AVERAGE HER2 COPY NUMBER PER CELL 2.00 RADIOGRAPHIC STUDIES: I have personally reviewed the  radiological images as listed and agreed with the findings in the report.  Bilateral diagnostic mammogram 02/03/17 IMPRESSION: Expected surgical changes in the upper-outer quadrant of the left breast. No mammographic evidence of malignancy in the bilateral breasts.  Mm Diag Breast Tomo Uni Left and [place] 01/23/2016  01/23/2016  CLINICAL DATA:  76 year old female presenting for screening recall of left breast distortion. EXAM: DIGITAL DIAGNOSTIC LEFT MAMMOGRAM WITH 3D TOMOSYNTHESIS AND CAD LEFT BREAST ULTRASOUND COMPARISON:  Previous exam(Michele). ACR Breast Density Category c: The breast tissue is heterogeneously dense, which may obscure small masses. FINDINGS: There is a persistent distortion in the upper-outer quadrant of the left breast in the posterior depth with a possible tiny associated mass. Mammographic images were processed with CAD. Physical exam of the upper-outer quadrant of the left breast demonstrates a small firm palpable lump at 1 o'clock, 4 cm from the nipple. Ultrasound targeted to the left breast at 1 o'clock, 4 cm from the nipple at the site of the palpable lump, there is a tiny 3-4 mm shadowing mass with possible internal blood flow on color Doppler imaging. Ultrasound of the left axilla demonstrates multiple normal-appearing lymph nodes. IMPRESSION: 1. A 3-4 mm shadowing mass is identified at 1 o'clock in the left breast, likely corresponding with the area of distortion on the mammogram. 2.  No evidence of left axillary lymphadenopathy. RECOMMENDATION: Ultrasound-guided biopsy is recommended for the shadowing mass in the left breast at 1 o'clock. This has been scheduled for 01/28/2016 at 2 p.m. I have discussed the findings and recommendations with the patient. Results were also provided in writing at the conclusion of the visit. If applicable, a reminder letter will be sent to the patient regarding the next appointment. BI-RADS CATEGORY  4: Suspicious. Electronically Signed   By: Ammie Ferrier M.D.   On: 01/23/2016 11:40   Mm Screening Breast Tomo Bilateral  01/14/2016  CLINICAL DATA:  Screening. EXAM: DIGITAL SCREENING BILATERAL MAMMOGRAM WITH 3D TOMO WITH CAD COMPARISON:  Previous exam(Michele). ACR Breast Density Category c: The breast  tissue is heterogeneously dense, which may obscure small masses. FINDINGS: In the left breast, possible mass with distortion warrants further evaluation. In the right breast, no findings suspicious for malignancy. Images were processed with CAD. IMPRESSION: Further evaluation is suggested for possible mass with distortion in the left breast. RECOMMENDATION: Diagnostic mammogram and possibly ultrasound of the left breast. (Code:[id number]-[id number]) The patient will be contacted regarding the findings, and additional imaging will be scheduled. BI-RADS CATEGORY  0: Incomplete. Need additional imaging evaluation and/or prior mammograms for comparison. Electronically Signed   By: Lajean Manes M.D.   On: 01/14/2016 11:36     ASSESSMENT & PLAN:  76 y.o. postmenopausal Caucasian woman, presented with screening this covered left breast cancer.  1. Breast cancer of upper-outer quadrant of left female breast, G1 invasive ductal carcinoma, pT1bN0M0, stage IA, ER+/PR+/HER2- -I previously reviewed her surgical pathology findings with patient in details. -She has stage I a disease, low grade, likely will do very well with stroke risk of recurrence. -We previously discussed the risk of cancer recurrence after complete surgical resection. Giving the very small size of the tumor, low-grade, low Ki-67, ER PR positive HER-2 negative disease, the risk of recurrence is quite low. I do not recommend adjuvant chemotherapy. -She has completed adjuvant breast radiation. -He is now on adjuvant endocrine therapy with anastrozole, is tolerating very well, we'll continue, plan for total 5 years. -Screening mammogram on 02/03/17 showed no evidence of malignancy. -She is clinically doing very  well, asymptomatic, lab reviewed, physical exam was unremarkable today. No clinical recurrence. -We will continue breast cancer surveillance, including annual screening mammogram, self exam, routine follow-up with exam by [place].  2. Osteopenia  -She likely has osteopenia, had spinal fracture after a mild fall -Her PCP Dr. Brigitte Pulse has recommended her to start prolia injection -I recommend her to obtain a bone density scan at her primary care physician'Michele office, so we can monitor her osteopenia. -We discussed anastrozole may worsen her osteopenia, if her follow-up DEXA scan showed significant worsening of T score, I recommend her to switch to Tamoxifen   3. HTN, AF, mitral regurgitation  -She'll continue follow-up with her primary care physician and cardiologist.  4. Genetics -Given her positive family history of breast cancer in her mother and a sister, we referred her to genetic counseling to rule out inheritable breast cancer syndrome.  -She was seen by our genetic counselor, and the genetic testing was negative.  5. Spinal fracture  -she will follow up with her orthopedic surgeon -I strongly encouraged her to follow up bone density scan every 2 years, to monitor her bone density   Plan -continue anastrozole -Lab and f/u in 6 months. She will be called to schedule this appointment. -will copy my note and labs to her cardiologist and PCP  -She will follow up with Dr. Brigitte Pulse for bone scan every 2 years -she will start Prolia at Dr. Raul Del office soon   All questions were answered. The patient knows to call the clinic with any problems, questions or concerns.  I spent 20 minutes counseling the patient face to face. The total time spent in the appointment was 25 minutes and more than 50% was on counseling.     Michele Merle, MD 03/04/2017

## 2017-03-04 ENCOUNTER — Ambulatory Visit (HOSPITAL_BASED_OUTPATIENT_CLINIC_OR_DEPARTMENT_OTHER): Payer: Medicare Other | Admitting: Hematology

## 2017-03-04 ENCOUNTER — Encounter: Payer: Self-pay | Admitting: Hematology

## 2017-03-04 ENCOUNTER — Other Ambulatory Visit (HOSPITAL_BASED_OUTPATIENT_CLINIC_OR_DEPARTMENT_OTHER): Payer: Medicare Other

## 2017-03-04 VITALS — BP 118/77 | HR 115 | Temp 98.0°F | Resp 18 | Ht 64.0 in | Wt 124.6 lb

## 2017-03-04 DIAGNOSIS — Z170 Estrogen receptor positive status [ER+]: Secondary | ICD-10-CM

## 2017-03-04 DIAGNOSIS — I10 Essential (primary) hypertension: Secondary | ICD-10-CM | POA: Diagnosis not present

## 2017-03-04 DIAGNOSIS — I4891 Unspecified atrial fibrillation: Secondary | ICD-10-CM | POA: Diagnosis not present

## 2017-03-04 DIAGNOSIS — M8580 Other specified disorders of bone density and structure, unspecified site: Secondary | ICD-10-CM

## 2017-03-04 DIAGNOSIS — I340 Nonrheumatic mitral (valve) insufficiency: Secondary | ICD-10-CM

## 2017-03-04 DIAGNOSIS — C50412 Malignant neoplasm of upper-outer quadrant of left female breast: Secondary | ICD-10-CM

## 2017-03-04 LAB — CBC WITH DIFFERENTIAL/PLATELET
BASO%: 0.8 % (ref 0.0–2.0)
Basophils Absolute: 0 10*3/uL (ref 0.0–0.1)
EOS%: 1.5 % (ref 0.0–7.0)
Eosinophils Absolute: 0.1 10*3/uL (ref 0.0–0.5)
HCT: 38.7 % (ref 34.8–46.6)
HGB: 13.3 g/dL (ref 11.6–15.9)
LYMPH%: 20.2 % (ref 14.0–49.7)
MCH: 36.5 pg — ABNORMAL HIGH (ref 25.1–34.0)
MCHC: 34.3 g/dL (ref 31.5–36.0)
MCV: 106.2 fL — ABNORMAL HIGH (ref 79.5–101.0)
MONO#: 0.4 10*3/uL (ref 0.1–0.9)
MONO%: 10.5 % (ref 0.0–14.0)
NEUT%: 67 % (ref 38.4–76.8)
NEUTROS ABS: 2.7 10*3/uL (ref 1.5–6.5)
PLATELETS: 146 10*3/uL (ref 145–400)
RBC: 3.64 10*6/uL — AB (ref 3.70–5.45)
RDW: 12.2 % (ref 11.2–14.5)
WBC: 4 10*3/uL (ref 3.9–10.3)
lymph#: 0.8 10*3/uL — ABNORMAL LOW (ref 0.9–3.3)

## 2017-03-04 LAB — COMPREHENSIVE METABOLIC PANEL
ALT: 15 U/L (ref 0–55)
AST: 24 U/L (ref 5–34)
Albumin: 3.5 g/dL (ref 3.5–5.0)
Alkaline Phosphatase: 85 U/L (ref 40–150)
Anion Gap: 10 mEq/L (ref 3–11)
BUN: 14.3 mg/dL (ref 7.0–26.0)
CHLORIDE: 104 meq/L (ref 98–109)
CO2: 26 meq/L (ref 22–29)
Calcium: 10 mg/dL (ref 8.4–10.4)
Creatinine: 0.8 mg/dL (ref 0.6–1.1)
EGFR: 74 mL/min/{1.73_m2} — ABNORMAL LOW (ref >90)
GLUCOSE: 90 mg/dL (ref 70–140)
POTASSIUM: 5.1 meq/L (ref 3.5–5.1)
SODIUM: 141 meq/L (ref 136–145)
Total Bilirubin: 0.74 mg/dL (ref 0.20–1.20)
Total Protein: 7 g/dL (ref 6.4–8.3)

## 2017-03-08 ENCOUNTER — Telehealth: Payer: Self-pay | Admitting: Hematology

## 2017-03-08 NOTE — Telephone Encounter (Signed)
Patient bypassed scheduling area on 03/07/17. Called patient to confirm appointment. Left voicemail. Appointment letter and schedule also mailed.

## 2017-04-04 ENCOUNTER — Other Ambulatory Visit: Payer: Self-pay | Admitting: Interventional Cardiology

## 2017-04-04 DIAGNOSIS — I4819 Other persistent atrial fibrillation: Secondary | ICD-10-CM

## 2017-04-05 ENCOUNTER — Telehealth: Payer: Self-pay | Admitting: Interventional Cardiology

## 2017-04-05 NOTE — Telephone Encounter (Signed)
New message     *STAT* If patient is at the pharmacy, call can be transferred to refill team.   1. Which medications need to be refilled? (please list name of each medication and dose if known) rivaroxaban (XARELTO) 20 MG TABS tablet  2. Which pharmacy/location (including street and city if local pharmacy) is medication to be sent to? [facility]  3. Do they need a 30 day or 90 day supply? 90 day supply   Pt is scheduled to see dr. Irish Lack on June 26th

## 2017-04-05 NOTE — Telephone Encounter (Signed)
rivaroxaban (XARELTO) 20 MG TABS tablet  Medication  Date: 11/24/2016 Department: [place] Office Ordering/Authorizing: Eileen Stanford, PA-C  Order Providers   Prescribing Provider Encounter Provider  Eileen Stanford, PA-C Eileen Stanford, PA-C  Supervision Information   Supervising Provider Type of Supervision  Nahser, Wonda Cheng, MD Supervision Required  Medication Detail    Disp Refills Start End   rivaroxaban (XARELTO) 20 MG TABS tablet 90 tablet 3 11/24/2016    Sig: Take 1 tablet by mouth daily with supper   E-Prescribing Status: Receipt confirmed by pharmacy (11/24/2016 1:47 PM EST)   Associated Diagnoses   Persistent atrial fibrillation [facility])     Pharmacy   [place] [place]

## 2017-04-20 ENCOUNTER — Ambulatory Visit
Admission: RE | Admit: 2017-04-20 | Discharge: 2017-04-20 | Disposition: A | Discharge status: Home / Self Care | Payer: Medicare Other | Source: Ambulatory Visit | Attending: Family Medicine | Admitting: Family Medicine

## 2017-04-20 ENCOUNTER — Other Ambulatory Visit: Payer: Self-pay | Admitting: Family Medicine

## 2017-04-20 DIAGNOSIS — R05 Cough: Secondary | ICD-10-CM

## 2017-04-20 DIAGNOSIS — R059 Cough, unspecified: Secondary | ICD-10-CM

## 2017-05-17 ENCOUNTER — Ambulatory Visit (INDEPENDENT_AMBULATORY_CARE_PROVIDER_SITE_OTHER): Payer: Medicare Other | Admitting: Interventional Cardiology

## 2017-05-17 ENCOUNTER — Encounter: Payer: Self-pay | Admitting: Interventional Cardiology

## 2017-05-17 VITALS — BP 102/60 | HR 44 | Ht 64.0 in | Wt 121.8 lb

## 2017-05-17 DIAGNOSIS — I4819 Other persistent atrial fibrillation: Secondary | ICD-10-CM

## 2017-05-17 DIAGNOSIS — I10 Essential (primary) hypertension: Secondary | ICD-10-CM

## 2017-05-17 DIAGNOSIS — I340 Nonrheumatic mitral (valve) insufficiency: Secondary | ICD-10-CM

## 2017-05-17 DIAGNOSIS — I481 Persistent atrial fibrillation: Secondary | ICD-10-CM

## 2017-05-17 DIAGNOSIS — Z7901 Long term (current) use of anticoagulants: Secondary | ICD-10-CM | POA: Diagnosis not present

## 2017-05-17 NOTE — Progress Notes (Signed)
Cardiology Office Note   Date:  05/17/2017   ID:  Michele Green, DOB [birth year], MRN [id number]  PCP:  [name], MD   Primary cardiologist: [name]   No chief complaint on file. AFib   Wt Readings from Last 3 Encounters:  05/17/17 121 lb 12.8 oz (55.2 kg)  03/04/17 124 lb 9.6 oz (56.5 kg)  12/30/16 124 lb (56.2 kg)       History of Present Illness: Michele Green is a 76 y.o. female  with a history of persistent atrial fibrillation on Xarelto, mod MR, hypertension and chronic diastolic heart failure.  2D ECHO 12/2015 showed EF 60-65%, mod MR, severe LAE.  Denies : Chest pain. Dizziness. Leg edema. Nitroglycerin use. Orthopnea. Palpitations. Paroxysmal nocturnal dyspnea. Shortness of breath. Syncope.   She is bothered most by chronic back pain since an injury last year.  She walks twice a week for exercise.    She bruises easily, but no internal bleeding.       Past Medical History:  Diagnosis Date  . Breast cancer ([place])   . Breast cancer of upper-outer quadrant of left female breast ([place]) 02/02/2016  . Colitis   . Colon polyp 02/2012  . Dupuytren contracture    bilateral hands  . Dysrhythmia   . Family history of breast cancer   . Hypertension   . On continuous oral anticoagulation 08/22/2015   Started on Xarelto 08/12/2015   . Osteopenia   . Persistent atrial fibrillation ([place]) 08/22/2015   Started late August or early September.   . Radiation 04/21/16-05/19/16   left breast 47.72 Gy, boosted to 10 Gy  . Wears glasses     Past Surgical History:  Procedure Laterality Date  . BREAST LUMPECTOMY WITH RADIOACTIVE SEED LOCALIZATION Left 02/23/2016   Procedure: BREAST LUMPECTOMY WITH RADIOACTIVE SEED LOCALIZATION;  Surgeon: [name], MD;  Location: [facility];  Service: General;  Laterality: Left;  . CARDIOVERSION N/A 10/02/2015   Procedure: CARDIOVERSION;  Surgeon: Jerline Pain, MD;  Location: [facility] ENDOSCOPY;  Service:  Cardiovascular;  Laterality: N/A;  . COLONOSCOPY    . DUPUYTREN CONTRACTURE RELEASE  2001   leftx2  . [place]   right  . DUPUYTREN CONTRACTURE RELEASE Right 05/02/2014   Procedure: EXCISION DUPUYTRENS RIGHT PALMAR/SMALL ;  Surgeon: Cammie Sickle, MD;  Location: [facility];  Service: Orthopedics;  Laterality: Right;  . TONSILLECTOMY  age 59     Current Outpatient Prescriptions  Medication Sig Dispense Refill  . allopurinol (ZYLOPRIM) 100 MG tablet Take 100 mg by mouth daily.     [name] anastrozole (ARIMIDEX) 1 MG tablet Take 1 tablet (1 mg total) by mouth daily. 90 tablet 1  . CALCIUM PO Take 1 tablet by mouth daily. Reported on 02/04/2016    . cyclobenzaprine (FLEXERIL) 5 MG tablet Take 5 mg by mouth 3 (three) times daily as needed for muscle spasms.    . digoxin (LANOXIN) 0.125 MG tablet Take 0.5 tablets (0.0625 mg total) by mouth daily. 90 tablet 3  . ergocalciferol (VITAMIN D2) 50000 UNITS capsule Take 50,000 Units by mouth once a week. Reported on 02/04/2016    . LIALDA 1.2 G EC tablet Take 1.2 g by mouth 3 (three) times daily.     . metoprolol succinate (TOPROL-XL) 25 MG 24 hr tablet Take 1 tablet (25 mg total) by mouth daily. 90 tablet 3  . rivaroxaban (XARELTO) 20 MG TABS tablet Take  1 tablet by mouth  daily with supper 90 tablet 3  . sertraline (ZOLOFT) 50 MG tablet Take 50 mg by mouth daily.    . simvastatin (ZOCOR) 20 MG tablet Take 20 mg by mouth daily.      No current facility-administered medications for this visit.     Allergies:   Patient has no known allergies.    Social History:  The patient  reports that she quit smoking about 24 years ago. Her smoking use included Cigarettes. She has a 20.00 pack-year smoking history. She has never used smokeless tobacco. She reports that she drinks about 6.0 oz of alcohol per week . She reports that she does not use drugs.   Family History:  The patient's family history includes Breast  cancer (age of onset: 31) in her sister; Breast cancer (age of onset: 40) in her mother; Cirrhosis in her mother; Heart attack in her father; Heart failure in her father; Kidney Stones in her sister.    ROS:  Please see the history of present illness.   Otherwise, review of systems are positive for back pain.   All other systems are reviewed and negative.    PHYSICAL EXAM: VS:  BP 102/60   Pulse (!) 44   Ht 5' 4"  (1.626 m)   Wt 121 lb 12.8 oz (55.2 kg)   SpO2 99%   BMI 20.91 kg/m  , BMI Body mass index is 20.91 kg/m. GEN: Well nourished, well developed, in no acute distress  HEENT: normal  Neck: no JVD, carotid bruits, or masses Cardiac: irregularly irregular; ; 3/6 early systolic murmur, ; no rubs, or gallops,no edema  Respiratory:  clear to auscultation bilaterally, normal work of breathing GI: soft, nontender, nondistended, + BS MS: no deformity or atrophy  Skin: warm and dry, no rash Neuro:  Strength and sensation are intact Psych: euthymic mood, full affect   EKG:   The ekg ordered today demonstrates AFib, nonspecific ST changes   Recent Labs: 03/04/2017: ALT 15; BUN 14.3; Creatinine 0.8; HGB 13.3; Platelets 146; Potassium 5.1; Sodium 141   Lipid Panel No results found for: CHOL, TRIG, HDL, CHOLHDL, VLDL, LDLCALC, LDLDIRECT   Other studies Reviewed: Additional studies/ records that were reviewed today with results demonstrating: .   ASSESSMENT AND PLAN:  1. AFib: Rate controlled. Continue anticoagulation with Xarelto. No symptoms of her atrial fibrillation. Would not pursue normal sinus rhythm at this time.   2. HTN: Blood pressure well controlled. Continue current medications. 3. Mitral regurgitation: No signs of CHF.  Prominent murmur on exam.  Last echo from 2017 reviewed. 4. Chronic diastolic heart failure: Appears euvolemic.   5. Hyperlipidemia: Continue simvastatin.    Current medicines are reviewed at length with the patient today.  The patient concerns  regarding her medicines were addressed.  The following changes have been made:  No change  Labs/ tests ordered today include:  No orders of the defined types were placed in this encounter.   Recommend 150 minutes/week of aerobic exercise Low fat, low carb, high fiber diet recommended  Disposition:   FU in 6 months with [name].   Signed, Larae Grooms, MD  05/17/2017 1:35 PM    [facility] [address], [place], [place]  [address] Phone: [phone]; Fax: [phone]

## 2017-05-17 NOTE — Patient Instructions (Signed)
Medication Instructions:  Your physician recommends that you continue on your current medications as directed. Please refer to the Current Medication list given to you today.   Labwork: None ordered  Testing/Procedures: None ordered  Follow-Up: Your physician wants you to follow-up in: 6 months with [name]. You will receive a reminder letter in the mail two months in advance. If you don't receive a letter, please call our office to schedule the follow-up appointment.   Any Other Special Instructions Will Be Listed Below (If Applicable).     If you need a refill on your cardiac medications before your next appointment, please call your pharmacy.

## 2017-05-20 ENCOUNTER — Other Ambulatory Visit: Payer: Self-pay | Admitting: Family Medicine

## 2017-05-20 ENCOUNTER — Ambulatory Visit
Admission: RE | Admit: 2017-05-20 | Discharge: 2017-05-20 | Disposition: A | Discharge status: Home / Self Care | Payer: Medicare Other | Source: Ambulatory Visit | Attending: Family Medicine | Admitting: Family Medicine

## 2017-05-20 DIAGNOSIS — M545 Low back pain: Secondary | ICD-10-CM

## 2017-07-01 ENCOUNTER — Other Ambulatory Visit: Payer: Self-pay | Admitting: Neurological Surgery

## 2017-07-01 DIAGNOSIS — S32020D Wedge compression fracture of second lumbar vertebra, subsequent encounter for fracture with routine healing: Secondary | ICD-10-CM

## 2017-07-01 DIAGNOSIS — M545 Low back pain, unspecified: Secondary | ICD-10-CM

## 2017-07-15 ENCOUNTER — Ambulatory Visit
Admission: RE | Admit: 2017-07-15 | Discharge: 2017-07-15 | Disposition: A | Discharge status: Home / Self Care | Payer: Medicare Other | Source: Ambulatory Visit | Attending: Neurological Surgery | Admitting: Neurological Surgery

## 2017-07-15 DIAGNOSIS — S32020D Wedge compression fracture of second lumbar vertebra, subsequent encounter for fracture with routine healing: Secondary | ICD-10-CM

## 2017-07-15 DIAGNOSIS — M545 Low back pain, unspecified: Secondary | ICD-10-CM

## 2017-07-15 MED ORDER — GADOBENATE DIMEGLUMINE 529 MG/ML IV SOLN
10.0000 mL | Freq: Once | INTRAVENOUS | Status: AC | PRN
  Administered 2017-07-15: 10 mL via INTRAVENOUS

## 2017-07-16 ENCOUNTER — Other Ambulatory Visit: Payer: Self-pay | Admitting: Interventional Cardiology

## 2017-07-18 ENCOUNTER — Ambulatory Visit
Admission: RE | Admit: 2017-07-18 | Discharge: 2017-07-18 | Disposition: A | Discharge status: Home / Self Care | Payer: Medicare Other | Source: Ambulatory Visit | Attending: Neurological Surgery | Admitting: Neurological Surgery

## 2017-08-05 ENCOUNTER — Other Ambulatory Visit: Payer: Self-pay | Admitting: Neurological Surgery

## 2017-08-17 ENCOUNTER — Encounter (HOSPITAL_COMMUNITY): Payer: Self-pay

## 2017-08-17 ENCOUNTER — Encounter (HOSPITAL_COMMUNITY)
Admission: RE | Admit: 2017-08-17 | Discharge: 2017-08-17 | Disposition: A | Discharge status: Home / Self Care | Payer: Medicare Other | Source: Ambulatory Visit | Attending: Neurological Surgery | Admitting: Neurological Surgery

## 2017-08-17 DIAGNOSIS — Z01812 Encounter for preprocedural laboratory examination: Secondary | ICD-10-CM | POA: Diagnosis present

## 2017-08-17 LAB — SURGICAL PCR SCREEN
MRSA, PCR: NEGATIVE
STAPHYLOCOCCUS AUREUS: NEGATIVE

## 2017-08-17 LAB — CBC
HCT: 37.9 % (ref 36.0–46.0)
Hemoglobin: 12.7 g/dL (ref 12.0–15.0)
MCH: 35.3 pg — ABNORMAL HIGH (ref 26.0–34.0)
MCHC: 33.5 g/dL (ref 30.0–36.0)
MCV: 105.3 fL — ABNORMAL HIGH (ref 78.0–100.0)
PLATELETS: 100 10*3/uL — AB (ref 150–400)
RBC: 3.6 MIL/uL — ABNORMAL LOW (ref 3.87–5.11)
RDW: 13.7 % (ref 11.5–15.5)
WBC: 2.9 10*3/uL — AB (ref 4.0–10.5)

## 2017-08-17 LAB — BASIC METABOLIC PANEL
Anion gap: 5 (ref 5–15)
BUN: 11 mg/dL (ref 6–20)
CO2: 26 mmol/L (ref 22–32)
CREATININE: 0.69 mg/dL (ref 0.44–1.00)
Calcium: 8.8 mg/dL — ABNORMAL LOW (ref 8.9–10.3)
Chloride: 109 mmol/L (ref 101–111)
GFR calc Af Amer: >60 mL/min (ref >60)
GLUCOSE: 95 mg/dL (ref 65–99)
Potassium: 3.8 mmol/L (ref 3.5–5.1)
SODIUM: 140 mmol/L (ref 135–145)

## 2017-08-17 NOTE — Pre-Procedure Instructions (Addendum)
KIERAH GOATLEY  08/17/2017      [place], [place] [address] [employer] [address] [place] [address] Phone: [phone] Fax: [phone]  [id number] [place], [place] [id number]: [phone]: [phone]   Your procedure is scheduled on Tues., Oct. 2  [facility]  Call this number if you have problems the morning of surgery:  [phone]  Remember:  Do not eat food or drink liquids after midnight on Mon., Oct. 1   Take these medicines the morning of surgery with A SIP OF WATER : allopurinol (zyloprim), anastrozole (arimidex), cyclobenzaprine(flexeril) if needed, digoxin (lanoxin), metoprolol succinate (toprolol-xl), sertraline (zoloft), tylenol if needed             Stop xarelto per Dr. DCyndy Freeze            7 days prior to surgery STOP taking any Aspirin, Aleve, Naproxen, Ibuprofen, Motrin, Advil, Goody's, BC's, all herbal medications, fish oil, and all vitamins and lialda    Do not wear jewelry, make-up or nail polish.  Do not wear lotions, powders, or perfumes, or deoderant.  Do not shave 48 hours prior to surgery.  Men may shave face and neck.  Do not bring valuables to the hospital.  [facility] any belongings or [facility]ables.  Contacts, dentures or bridgework may not be worn into surgery.  Leave your suitcase in the car.  After surgery it may be brought to your room.  For patients admitted to the hospital, discharge time will be determined by your treatment team.  Patients discharged the day of surgery will not be allowed to drive home.    Special instructions:   [place]- Preparing For Surgery  Before surgery, you can play an important role. Because skin is not sterile, your skin needs to be as free of germs as possible. You can reduce the number of germs on your skin by washing with  CHG (chlorahexidine gluconate) Soap before surgery.  CHG is an antiseptic cleaner which kills germs and bonds with the skin to continue killing germs even after washing.  Please do not use if you have an allergy to CHG or antibacterial soaps. If your skin becomes reddened/irritated stop using the CHG.  Do not shave (including legs and underarms) for at least 48 hours prior to first CHG shower. It is OK to shave your face.  Please follow these instructions carefully.   1. Shower the NIGHT BEFORE SURGERY and the MORNING OF SURGERY with CHG.   2. If you chose to wash your hair, wash your hair first as usual with your normal shampoo.  3. After you shampoo, rinse your hair and body thoroughly to remove the shampoo.  4. Use CHG as you would any other liquid soap. You can apply CHG directly to the skin and wash gently with a scrungie or a clean washcloth.   5. Apply the CHG Soap to your body ONLY FROM THE NECK DOWN.  Do not use on open wounds or open sores. Avoid contact with your eyes, ears, mouth and genitals (private parts). Wash genitals (private parts) with your normal soap.  6. Wash thoroughly, paying special attention to the area where your surgery will be performed.  7. Thoroughly rinse your body with warm water from the neck down.  8. DO NOT shower/wash  with your normal soap after using and rinsing off the CHG Soap.  9. Pat yourself dry with a CLEAN TOWEL.   10. Wear CLEAN PAJAMAS   11. Place CLEAN SHEETS on your bed the night of your first shower and DO NOT SLEEP WITH PETS.    Day of Surgery: Do not apply any deodorants/lotions. Please wear clean clothes to the hospital/surgery center.      Please read over the following fact sheets that you were given. Coughing and Deep Breathing, MRSA Information and Surgical Site Infection Prevention

## 2017-08-17 NOTE — Progress Notes (Signed)
PCP:Dr. [name] Cardiologist: [name] and Dr. Irish Lack  Pt. Instructed to stop xarelto 3 days prior to surgery.

## 2017-08-18 NOTE — Progress Notes (Signed)
Anesthesia Chart Review:  Pt is a 76 year old female scheduled for removal L1-5 internal fixation hardware on 08/23/2017 with Cyndy Freeze, MD  - PCP is [name], MD - Cardiologist is Daneen Schick, MD; last office visit 05/17/17 with Dr. Irish Lack  PMH includes:  HTN, PAF, breast cancer. Former smoker. BMI 21. S/p breast lumpectomy 02/23/16.   Medications include: Arimidex, digoxin, metoprolol, xarelto, simvastatin. Pt to stop xarelto 3 days before surgery.   BP 118/67   Pulse 94   Temp (!) 36.4 C   Resp 20   Ht 5' 4"  (1.626 m)   Wt 124 lb 4.8 oz (56.4 kg)   SpO2 97%   BMI 21.34 kg/m   Preoperative labs reviewed.  PT will be obtained DOS  CXR 04/20/17: Bronchitic changes. Aortic atherosclerosis.  EKG 05/17/17: atrial fibrillation (94 bpm). Nonspecific ST and T wave abnormality.   Holter monitor 09/06/16:   Continuous atrial fibrillation with average heart rate 106 bpm.  No bradycardia or pauses greater than 3 seconds.  Rare ventricular ectopy. - Continuous atrial fibrillation with poor rate control.  Echo 01/19/16:  - Left ventricle: The cavity size was normal. Wall thickness was normal. Systolic function was normal. The estimated ejection fraction was in the range of 60% to 65%. Left ventricular diastolic function parameters were normal. - Mitral valve: MV is thickened, difficult to see well. Cannot exclude some prolapse. MR appears more moderate, some coursing posterior into LA. There was moderate regurgitation. - Left atrium: The atrium was severely dilated.  If no changes, I anticipate pt can proceed with surgery as scheduled.   Willeen Cass, FNP-BC [facility]/Anesthesiology Phone: [phone] 08/18/2017 12:56 PM

## 2017-08-22 MED ORDER — CEFAZOLIN SODIUM-DEXTROSE 2-4 GM/100ML-% IV SOLN
2.0000 g | INTRAVENOUS | Status: AC
  Administered 2017-08-23: 2 g via INTRAVENOUS
  Filled 2017-08-22: qty 100

## 2017-08-23 ENCOUNTER — Inpatient Hospital Stay (HOSPITAL_COMMUNITY)
Admission: RE | Admit: 2017-08-23 | Discharge: 2017-08-24 | DRG: 496 | Disposition: A | Discharge status: Home / Self Care | Payer: Medicare Other | Source: Ambulatory Visit | Attending: Neurological Surgery | Admitting: Neurological Surgery

## 2017-08-23 ENCOUNTER — Encounter (HOSPITAL_COMMUNITY): Payer: Self-pay | Admitting: *Deleted

## 2017-08-23 ENCOUNTER — Telehealth: Payer: Self-pay

## 2017-08-23 ENCOUNTER — Encounter (HOSPITAL_COMMUNITY)
Admission: RE | Disposition: A | Discharge status: Home / Self Care | Payer: Self-pay | Source: Ambulatory Visit | Attending: Neurological Surgery

## 2017-08-23 ENCOUNTER — Inpatient Hospital Stay (HOSPITAL_COMMUNITY): Payer: Medicare Other | Admitting: Anesthesiology

## 2017-08-23 ENCOUNTER — Inpatient Hospital Stay (HOSPITAL_COMMUNITY): Payer: Medicare Other | Admitting: Emergency Medicine

## 2017-08-23 DIAGNOSIS — T84038A Mechanical loosening of other internal prosthetic joint, initial encounter: Secondary | ICD-10-CM | POA: Diagnosis present

## 2017-08-23 DIAGNOSIS — Z7901 Long term (current) use of anticoagulants: Secondary | ICD-10-CM | POA: Diagnosis not present

## 2017-08-23 DIAGNOSIS — I10 Essential (primary) hypertension: Secondary | ICD-10-CM | POA: Diagnosis present

## 2017-08-23 DIAGNOSIS — T84498A Other mechanical complication of other internal orthopedic devices, implants and grafts, initial encounter: Secondary | ICD-10-CM | POA: Diagnosis present

## 2017-08-23 DIAGNOSIS — Z87891 Personal history of nicotine dependence: Secondary | ICD-10-CM | POA: Diagnosis not present

## 2017-08-23 DIAGNOSIS — Z803 Family history of malignant neoplasm of breast: Secondary | ICD-10-CM

## 2017-08-23 DIAGNOSIS — I481 Persistent atrial fibrillation: Secondary | ICD-10-CM | POA: Diagnosis present

## 2017-08-23 DIAGNOSIS — Z79899 Other long term (current) drug therapy: Secondary | ICD-10-CM | POA: Diagnosis not present

## 2017-08-23 DIAGNOSIS — Z8249 Family history of ischemic heart disease and other diseases of the circulatory system: Secondary | ICD-10-CM

## 2017-08-23 DIAGNOSIS — M549 Dorsalgia, unspecified: Secondary | ICD-10-CM | POA: Diagnosis present

## 2017-08-23 DIAGNOSIS — M960 Pseudarthrosis after fusion or arthrodesis: Secondary | ICD-10-CM | POA: Diagnosis present

## 2017-08-23 DIAGNOSIS — C50412 Malignant neoplasm of upper-outer quadrant of left female breast: Secondary | ICD-10-CM | POA: Diagnosis present

## 2017-08-23 DIAGNOSIS — Z923 Personal history of irradiation: Secondary | ICD-10-CM | POA: Diagnosis not present

## 2017-08-23 DIAGNOSIS — M810 Age-related osteoporosis without current pathological fracture: Secondary | ICD-10-CM | POA: Diagnosis present

## 2017-08-23 LAB — PROTIME-INR
INR: 1.13
Prothrombin Time: 14.4 seconds (ref 11.4–15.2)

## 2017-08-23 SURGERY — POSTERIOR LUMBAR FUSION 1 WITH HARDWARE REMOVAL
Anesthesia: General | Site: Back

## 2017-08-23 MED ORDER — SODIUM CHLORIDE 0.9 % IJ SOLN
INTRAMUSCULAR | Status: DC | PRN
  Administered 2017-08-23 (×2): 10 mL

## 2017-08-23 MED ORDER — THROMBIN 5000 UNITS EX SOLR
CUTANEOUS | Status: AC
  Filled 2017-08-23: qty 5000

## 2017-08-23 MED ORDER — DEXAMETHASONE SODIUM PHOSPHATE 10 MG/ML IJ SOLN
INTRAMUSCULAR | Status: AC
  Filled 2017-08-23: qty 2

## 2017-08-23 MED ORDER — MENTHOL 3 MG MT LOZG: 1.0000 | LOZENGE | OROMUCOSAL | Status: DC | PRN

## 2017-08-23 MED ORDER — PROPOFOL 10 MG/ML IV BOLUS
INTRAVENOUS | Status: AC
  Filled 2017-08-23: qty 20

## 2017-08-23 MED ORDER — LIDOCAINE 2% (20 MG/ML) 5 ML SYRINGE
INTRAMUSCULAR | Status: AC
  Filled 2017-08-23: qty 10

## 2017-08-23 MED ORDER — SODIUM CHLORIDE 0.9 % IJ SOLN
INTRAMUSCULAR | Status: AC
  Filled 2017-08-23: qty 20

## 2017-08-23 MED ORDER — LIDOCAINE-EPINEPHRINE 2 %-1:100000 IJ SOLN
INTRAMUSCULAR | Status: AC
  Filled 2017-08-23: qty 1

## 2017-08-23 MED ORDER — SUGAMMADEX SODIUM 200 MG/2ML IV SOLN
INTRAVENOUS | Status: DC | PRN
  Administered 2017-08-23: 200 mg via INTRAVENOUS

## 2017-08-23 MED ORDER — SODIUM CHLORIDE 0.9 % IV SOLN: INTRAVENOUS | Status: DC

## 2017-08-23 MED ORDER — SUGAMMADEX SODIUM 500 MG/5ML IV SOLN
INTRAVENOUS | Status: AC
  Filled 2017-08-23: qty 5

## 2017-08-23 MED ORDER — FLEET ENEMA 7-19 GM/118ML RE ENEM: 1.0000 | ENEMA | Freq: Once | RECTAL | Status: DC | PRN

## 2017-08-23 MED ORDER — BUPIVACAINE-EPINEPHRINE (PF) 0.5% -1:200000 IJ SOLN
INTRAMUSCULAR | Status: AC
  Filled 2017-08-23: qty 30

## 2017-08-23 MED ORDER — THROMBIN 20000 UNITS EX SOLR
CUTANEOUS | Status: DC | PRN
  Administered 2017-08-23: 20 mL via TOPICAL

## 2017-08-23 MED ORDER — PHENOL 1.4 % MT LIQD: 1.0000 | OROMUCOSAL | Status: DC | PRN

## 2017-08-23 MED ORDER — OXYCODONE HCL ER 10 MG PO T12A
10.0000 mg | EXTENDED_RELEASE_TABLET | Freq: Two times a day (BID) | ORAL | Status: DC
  Administered 2017-08-23 – 2017-08-24 (×2): 10 mg via ORAL
  Filled 2017-08-23 (×2): qty 1

## 2017-08-23 MED ORDER — ZOLPIDEM TARTRATE 5 MG PO TABS: 5.0000 mg | ORAL_TABLET | Freq: Every evening | ORAL | Status: DC | PRN

## 2017-08-23 MED ORDER — THROMBIN 20000 UNITS EX SOLR
CUTANEOUS | Status: AC
  Filled 2017-08-23: qty 20000

## 2017-08-23 MED ORDER — DIGOXIN 0.0625 MG HALF TABLET
0.0625 mg | ORAL_TABLET | Freq: Every day | ORAL | Status: DC
  Administered 2017-08-24: 0.0625 mg via ORAL
  Filled 2017-08-23: qty 1

## 2017-08-23 MED ORDER — SODIUM CHLORIDE 0.9 % IR SOLN
Status: DC | PRN
  Administered 2017-08-23: 500 mL

## 2017-08-23 MED ORDER — LACTATED RINGERS IV SOLN
INTRAVENOUS | Status: DC | PRN
  Administered 2017-08-23 (×2): via INTRAVENOUS

## 2017-08-23 MED ORDER — SODIUM CHLORIDE 0.9% FLUSH: 3.0000 mL | INTRAVENOUS | Status: DC | PRN

## 2017-08-23 MED ORDER — HYDROMORPHONE HCL 1 MG/ML IJ SOLN: 1.0000 mg | INTRAMUSCULAR | Status: DC | PRN

## 2017-08-23 MED ORDER — ANASTROZOLE 1 MG PO TABS
1.0000 mg | ORAL_TABLET | Freq: Every day | ORAL | Status: DC
  Administered 2017-08-24: 1 mg via ORAL
  Filled 2017-08-23: qty 1

## 2017-08-23 MED ORDER — BUPIVACAINE-EPINEPHRINE (PF) 0.5% -1:200000 IJ SOLN
INTRAMUSCULAR | Status: DC | PRN
  Administered 2017-08-23: 15 mL

## 2017-08-23 MED ORDER — LACTATED RINGERS IV SOLN
INTRAVENOUS | Status: DC
  Administered 2017-08-23: 10:00:00 via INTRAVENOUS

## 2017-08-23 MED ORDER — SENNA 8.6 MG PO TABS
1.0000 | ORAL_TABLET | Freq: Two times a day (BID) | ORAL | Status: DC
  Administered 2017-08-23: 8.6 mg via ORAL
  Filled 2017-08-23: qty 1

## 2017-08-23 MED ORDER — PROPOFOL 10 MG/ML IV BOLUS
INTRAVENOUS | Status: DC | PRN
  Administered 2017-08-23: 110 mg via INTRAVENOUS

## 2017-08-23 MED ORDER — ARTIFICIAL TEARS OPHTHALMIC OINT
TOPICAL_OINTMENT | OPHTHALMIC | Status: DC | PRN
  Administered 2017-08-23: 1 via OPHTHALMIC

## 2017-08-23 MED ORDER — SIMVASTATIN 20 MG PO TABS: 20.0000 mg | ORAL_TABLET | Freq: Every day | ORAL | Status: DC

## 2017-08-23 MED ORDER — ONDANSETRON HCL 4 MG/2ML IJ SOLN: 4.0000 mg | Freq: Four times a day (QID) | INTRAMUSCULAR | Status: DC | PRN

## 2017-08-23 MED ORDER — DEXAMETHASONE SODIUM PHOSPHATE 10 MG/ML IJ SOLN
INTRAMUSCULAR | Status: DC | PRN
  Administered 2017-08-23: 10 mg via INTRAVENOUS

## 2017-08-23 MED ORDER — METHYLPREDNISOLONE ACETATE 80 MG/ML IJ SUSP
INTRAMUSCULAR | Status: AC
  Filled 2017-08-23: qty 1

## 2017-08-23 MED ORDER — DOCUSATE SODIUM 100 MG PO CAPS
100.0000 mg | ORAL_CAPSULE | Freq: Two times a day (BID) | ORAL | Status: DC
  Administered 2017-08-23 – 2017-08-24 (×2): 100 mg via ORAL
  Filled 2017-08-23 (×2): qty 1

## 2017-08-23 MED ORDER — ARTIFICIAL TEARS OPHTHALMIC OINT
TOPICAL_OINTMENT | OPHTHALMIC | Status: AC
  Filled 2017-08-23: qty 3.5

## 2017-08-23 MED ORDER — LIDOCAINE HCL (CARDIAC) 20 MG/ML IV SOLN
INTRAVENOUS | Status: DC | PRN
  Administered 2017-08-23: 50 mg via INTRAVENOUS

## 2017-08-23 MED ORDER — MEPERIDINE HCL 25 MG/ML IJ SOLN: 6.2500 mg | INTRAMUSCULAR | Status: DC | PRN

## 2017-08-23 MED ORDER — SUGAMMADEX SODIUM 200 MG/2ML IV SOLN
INTRAVENOUS | Status: AC
  Filled 2017-08-23: qty 2

## 2017-08-23 MED ORDER — GABAPENTIN 300 MG PO CAPS
300.0000 mg | ORAL_CAPSULE | Freq: Three times a day (TID) | ORAL | Status: DC
  Administered 2017-08-23 – 2017-08-24 (×3): 300 mg via ORAL
  Filled 2017-08-23 (×3): qty 1

## 2017-08-23 MED ORDER — METOPROLOL SUCCINATE ER 25 MG PO TB24: 25.0000 mg | ORAL_TABLET | Freq: Every day | ORAL | Status: DC

## 2017-08-23 MED ORDER — ONDANSETRON HCL 4 MG PO TABS: 4.0000 mg | ORAL_TABLET | Freq: Four times a day (QID) | ORAL | Status: DC | PRN

## 2017-08-23 MED ORDER — SENNOSIDES-DOCUSATE SODIUM 8.6-50 MG PO TABS: 1.0000 | ORAL_TABLET | Freq: Every evening | ORAL | Status: DC | PRN

## 2017-08-23 MED ORDER — FENTANYL CITRATE (PF) 250 MCG/5ML IJ SOLN
INTRAMUSCULAR | Status: AC
  Filled 2017-08-23: qty 5

## 2017-08-23 MED ORDER — 0.9 % SODIUM CHLORIDE (POUR BTL) OPTIME
TOPICAL | Status: DC | PRN
  Administered 2017-08-23: 1000 mL

## 2017-08-23 MED ORDER — FENTANYL CITRATE (PF) 100 MCG/2ML IJ SOLN
INTRAMUSCULAR | Status: AC
  Filled 2017-08-23: qty 2

## 2017-08-23 MED ORDER — PHENYLEPHRINE HCL 10 MG/ML IJ SOLN
INTRAMUSCULAR | Status: DC | PRN
  Administered 2017-08-23: 40 ug via INTRAVENOUS
  Administered 2017-08-23 (×3): 80 ug via INTRAVENOUS

## 2017-08-23 MED ORDER — ROCURONIUM BROMIDE 100 MG/10ML IV SOLN
INTRAVENOUS | Status: DC | PRN
  Administered 2017-08-23: 50 mg via INTRAVENOUS

## 2017-08-23 MED ORDER — THROMBIN 5000 UNITS EX SOLR
OROMUCOSAL | Status: DC | PRN
  Administered 2017-08-23: 5 mL via TOPICAL

## 2017-08-23 MED ORDER — KETOROLAC TROMETHAMINE 30 MG/ML IJ SOLN
INTRAMUSCULAR | Status: AC
  Filled 2017-08-23: qty 1

## 2017-08-23 MED ORDER — OXYCODONE HCL 5 MG PO TABS
5.0000 mg | ORAL_TABLET | ORAL | Status: DC | PRN
  Administered 2017-08-23: 10 mg via ORAL
  Filled 2017-08-23: qty 2

## 2017-08-23 MED ORDER — ONDANSETRON HCL 4 MG/2ML IJ SOLN
INTRAMUSCULAR | Status: AC
  Filled 2017-08-23: qty 4

## 2017-08-23 MED ORDER — FENTANYL CITRATE (PF) 100 MCG/2ML IJ SOLN
25.0000 ug | INTRAMUSCULAR | Status: DC | PRN
  Administered 2017-08-23 (×4): 25 ug via INTRAVENOUS

## 2017-08-23 MED ORDER — PHENYLEPHRINE HCL 10 MG/ML IJ SOLN
INTRAVENOUS | Status: DC | PRN
  Administered 2017-08-23: 50 ug/min via INTRAVENOUS

## 2017-08-23 MED ORDER — METHOCARBAMOL 500 MG PO TABS
500.0000 mg | ORAL_TABLET | Freq: Four times a day (QID) | ORAL | Status: DC | PRN
  Administered 2017-08-23 – 2017-08-24 (×2): 500 mg via ORAL
  Filled 2017-08-23 (×2): qty 1

## 2017-08-23 MED ORDER — VANCOMYCIN HCL 1000 MG IV SOLR
INTRAVENOUS | Status: AC
  Filled 2017-08-23: qty 1000

## 2017-08-23 MED ORDER — LIDOCAINE-EPINEPHRINE 2 %-1:100000 IJ SOLN
INTRAMUSCULAR | Status: DC | PRN
  Administered 2017-08-23: 15 mL

## 2017-08-23 MED ORDER — ACETAMINOPHEN 500 MG PO TABS
1000.0000 mg | ORAL_TABLET | Freq: Four times a day (QID) | ORAL | Status: DC
  Administered 2017-08-23 – 2017-08-24 (×3): 1000 mg via ORAL
  Filled 2017-08-23 (×3): qty 2

## 2017-08-23 MED ORDER — SODIUM CHLORIDE 0.9 % IV SOLN: 250.0000 mL | INTRAVENOUS | Status: DC

## 2017-08-23 MED ORDER — SODIUM CHLORIDE 0.9% FLUSH
3.0000 mL | Freq: Two times a day (BID) | INTRAVENOUS | Status: DC
  Administered 2017-08-23 (×2): 3 mL via INTRAVENOUS

## 2017-08-23 MED ORDER — ONDANSETRON HCL 4 MG/2ML IJ SOLN
INTRAMUSCULAR | Status: DC | PRN
  Administered 2017-08-23: 4 mg via INTRAVENOUS

## 2017-08-23 MED ORDER — BUPIVACAINE LIPOSOME 1.3 % IJ SUSP
20.0000 mL | INTRAMUSCULAR | Status: AC
  Administered 2017-08-23: 20 mL
  Filled 2017-08-23: qty 20

## 2017-08-23 MED ORDER — ROCURONIUM BROMIDE 10 MG/ML (PF) SYRINGE
PREFILLED_SYRINGE | INTRAVENOUS | Status: AC
  Filled 2017-08-23: qty 10

## 2017-08-23 MED ORDER — METHOCARBAMOL 1000 MG/10ML IJ SOLN
500.0000 mg | Freq: Four times a day (QID) | INTRAVENOUS | Status: DC | PRN
  Filled 2017-08-23: qty 5

## 2017-08-23 MED ORDER — ACETAMINOPHEN 325 MG PO TABS: 650.0000 mg | ORAL_TABLET | ORAL | Status: DC | PRN

## 2017-08-23 MED ORDER — ACETAMINOPHEN 650 MG RE SUPP: 650.0000 mg | RECTAL | Status: DC | PRN

## 2017-08-23 MED ORDER — SODIUM CHLORIDE 0.9 % IJ SOLN
INTRAMUSCULAR | Status: AC
  Filled 2017-08-23: qty 10

## 2017-08-23 MED ORDER — BISACODYL 10 MG RE SUPP: 10.0000 mg | Freq: Every day | RECTAL | Status: DC | PRN

## 2017-08-23 MED ORDER — FENTANYL CITRATE (PF) 100 MCG/2ML IJ SOLN
INTRAMUSCULAR | Status: DC | PRN
  Administered 2017-08-23: 150 ug via INTRAVENOUS
  Administered 2017-08-23 (×2): 50 ug via INTRAVENOUS

## 2017-08-23 MED ORDER — CEFAZOLIN SODIUM-DEXTROSE 2-4 GM/100ML-% IV SOLN
2.0000 g | Freq: Three times a day (TID) | INTRAVENOUS | Status: AC
  Administered 2017-08-23 – 2017-08-24 (×2): 2 g via INTRAVENOUS
  Filled 2017-08-23 (×2): qty 100

## 2017-08-23 SURGICAL SUPPLY — 73 items
ADH SKN CLS APL DERMABOND .7 (GAUZE/BANDAGES/DRESSINGS) ×2
APL SKNCLS STERI-STRIP NONHPOA (GAUZE/BANDAGES/DRESSINGS) ×1
BAG DECANTER FOR FLEXI CONT (MISCELLANEOUS) ×3 IMPLANT
BENZOIN TINCTURE PRP APPL 2/3 (GAUZE/BANDAGES/DRESSINGS) ×3 IMPLANT
BLADE CLIPPER SURG (BLADE) IMPLANT
BUR MATCHSTICK NEURO 3.0 LAGG (BURR) ×3 IMPLANT
BUR ROUND FLUTED 5 RND (BURR) ×2 IMPLANT
BUR ROUND FLUTED 5MM RND (BURR) ×1
CANISTER SUCT 3000ML PPV (MISCELLANEOUS) ×6 IMPLANT
CARTRIDGE OIL MAESTRO DRILL (MISCELLANEOUS) ×1 IMPLANT
CHLORAPREP W/TINT 26ML (MISCELLANEOUS) ×3 IMPLANT
DECANTER SPIKE VIAL GLASS SM (MISCELLANEOUS) ×3 IMPLANT
DERMABOND ADVANCED (GAUZE/BANDAGES/DRESSINGS) ×4
DERMABOND ADVANCED .7 DNX12 (GAUZE/BANDAGES/DRESSINGS) ×1 IMPLANT
DIFFUSER DRILL AIR PNEUMATIC (MISCELLANEOUS) ×3 IMPLANT
DRAPE C-ARM 42X72 X-RAY (DRAPES) ×6 IMPLANT
DRAPE MICROSCOPE LEICA (MISCELLANEOUS) IMPLANT
DRAPE POUCH INSTRU U-SHP 10X18 (DRAPES) ×3 IMPLANT
DRAPE SHEET LG 3/4 BI-LAMINATE (DRAPES) ×2 IMPLANT
DRAPE SURG 17X23 STRL (DRAPES) ×3 IMPLANT
DRSG OPSITE POSTOP 4X10 (GAUZE/BANDAGES/DRESSINGS) ×2 IMPLANT
ELECT COATED BLADE 2.86 ST (ELECTRODE) ×3 IMPLANT
ELECT REM PT RETURN 9FT ADLT (ELECTROSURGICAL) ×3
ELECTRODE REM PT RTRN 9FT ADLT (ELECTROSURGICAL) ×1 IMPLANT
EVACUATOR 1/8 PVC DRAIN (DRAIN) ×2 IMPLANT
GAUZE SPONGE 4X4 12PLY STRL (GAUZE/BANDAGES/DRESSINGS) IMPLANT
GAUZE SPONGE 4X4 16PLY XRAY LF (GAUZE/BANDAGES/DRESSINGS) IMPLANT
GLOVE BIO SURGEON STRL SZ7 (GLOVE) IMPLANT
GLOVE BIOGEL PI IND STRL 7.0 (GLOVE) IMPLANT
GLOVE BIOGEL PI IND STRL 7.5 (GLOVE) ×2 IMPLANT
GLOVE BIOGEL PI INDICATOR 7.0 (GLOVE)
GLOVE BIOGEL PI INDICATOR 7.5 (GLOVE) ×4
GLOVE SS BIOGEL STRL SZ 7.5 (GLOVE) ×3 IMPLANT
GLOVE SUPERSENSE BIOGEL SZ 7.5 (GLOVE) ×6
GOWN STRL REUS W/ TWL LRG LVL3 (GOWN DISPOSABLE) ×2 IMPLANT
GOWN STRL REUS W/ TWL XL LVL3 (GOWN DISPOSABLE) IMPLANT
GOWN STRL REUS W/TWL LRG LVL3 (GOWN DISPOSABLE) ×6
GOWN STRL REUS W/TWL XL LVL3 (GOWN DISPOSABLE)
HEMOSTAT POWDER KIT SURGIFOAM (HEMOSTASIS) ×3 IMPLANT
KIT BASIN OR (CUSTOM PROCEDURE TRAY) ×3 IMPLANT
KIT ROOM TURNOVER OR (KITS) ×3 IMPLANT
NDL HYPO 21X1.5 SAFETY (NEEDLE) ×2 IMPLANT
NEEDLE HYPO 18GX1.5 BLUNT FILL (NEEDLE) ×3 IMPLANT
NEEDLE HYPO 21X1.5 SAFETY (NEEDLE) ×9 IMPLANT
NEEDLE HYPO 25X1 1.5 SAFETY (NEEDLE) ×3 IMPLANT
NS IRRIG 1000ML POUR BTL (IV SOLUTION) ×3 IMPLANT
OIL CARTRIDGE MAESTRO DRILL (MISCELLANEOUS) ×3
PACK LAMINECTOMY NEURO (CUSTOM PROCEDURE TRAY) ×3 IMPLANT
PACK UNIVERSAL I (CUSTOM PROCEDURE TRAY) ×3 IMPLANT
PAD ARMBOARD 7.5X6 YLW CONV (MISCELLANEOUS) ×9 IMPLANT
PATTIES SURGICAL .5X1.5 (GAUZE/BANDAGES/DRESSINGS) ×3 IMPLANT
RUBBERBAND STERILE (MISCELLANEOUS) IMPLANT
SPONGE NEURO XRAY DETECT 1X3 (DISPOSABLE) ×3 IMPLANT
SPONGE SURGIFOAM ABS GEL 100 (HEMOSTASIS) ×3 IMPLANT
STRIP SURGICAL 1 X 6 IN (GAUZE/BANDAGES/DRESSINGS) IMPLANT
STRIP SURGICAL 1/2 X 6 IN (GAUZE/BANDAGES/DRESSINGS) IMPLANT
STRIP SURGICAL 1/4 X 6 IN (GAUZE/BANDAGES/DRESSINGS) IMPLANT
STRIP SURGICAL 3/4 X 6 IN (GAUZE/BANDAGES/DRESSINGS) IMPLANT
SUT STRATAFIX MNCRL+ 3-0 PS-2 (SUTURE) ×1
SUT STRATAFIX MONOCRYL 3-0 (SUTURE) ×2
SUT VIC AB 0 CT1 18XCR BRD8 (SUTURE) ×2 IMPLANT
SUT VIC AB 0 CT1 8-18 (SUTURE) ×6
SUT VIC AB 2-0 CT1 18 (SUTURE) ×10 IMPLANT
SUT VIC AB 3-0 SH 8-18 (SUTURE) ×6 IMPLANT
SUT VIC AB 4-0 PS2 27 (SUTURE) ×3 IMPLANT
SUTURE STRATFX MNCRL+ 3-0 PS-2 (SUTURE) IMPLANT
SYR 30ML LL (SYRINGE) ×8 IMPLANT
SYR 5ML LL (SYRINGE) ×3 IMPLANT
TOWEL GREEN STERILE (TOWEL DISPOSABLE) ×3 IMPLANT
TOWEL GREEN STERILE FF (TOWEL DISPOSABLE) ×3 IMPLANT
TUBE CONNECTING 12'X1/4 (SUCTIONS) ×1
TUBE CONNECTING 12X1/4 (SUCTIONS) ×1 IMPLANT
WATER STERILE IRR 1000ML POUR (IV SOLUTION) ×3 IMPLANT

## 2017-08-23 NOTE — H&P (Signed)
Chief Complaint   Back pain  HPI   HPI: Michele Green is a 76 y.o. female who presents for elective surgery. She has had worsening back pain over the lst several months. She underwent L1-5 fusion and fixation due to L3 burst fracture but unfortunately suffered an L2 compression fracture and hardware loosing at L1 and L2. Because her T score is -3.3, the best we can offer is removal of the screws which is likely contributing to her pain. Presents today for surgery. She feels well and is without concerns. Ready for surgery.  Patient Active Problem List   Diagnosis Date Noted  . Genetic testing 03/17/2016  . Family history of breast cancer   . Breast cancer of upper-outer quadrant of left female breast ([place]) 02/02/2016  . On amiodarone therapy 09/09/2015  . On continuous oral anticoagulation 08/22/2015  . Persistent atrial fibrillation ([place]) 08/22/2015  . Mitral regurgitation 11/07/2013  . Essential hypertension 11/07/2013  . Hyperlipidemia 11/07/2013    PMH: Past Medical History:  Diagnosis Date  . Breast cancer ([place])   . Breast cancer of upper-outer quadrant of left female breast ([place]) 02/02/2016  . Colitis   . Colon polyp 02/2012  . Dupuytren contracture    bilateral hands  . Dysrhythmia   . Family history of breast cancer   . Hypertension   . On continuous oral anticoagulation 08/22/2015   Started on Xarelto 08/12/2015   . Osteopenia   . Persistent atrial fibrillation ([place]) 08/22/2015   Started late August or early September.   . Radiation 04/21/16-05/19/16   left breast 47.72 Gy, boosted to 10 Gy  . Wears glasses     PSH: Past Surgical History:  Procedure Laterality Date  . BREAST LUMPECTOMY WITH RADIOACTIVE SEED LOCALIZATION Left 02/23/2016   Procedure: BREAST LUMPECTOMY WITH RADIOACTIVE SEED LOCALIZATION;  Surgeon: [name], MD;  Location: [facility];  Service: General;  Laterality: Left;  . CARDIOVERSION N/A 10/02/2015   Procedure:  CARDIOVERSION;  Surgeon: Jerline Pain, MD;  Location: [facility] ENDOSCOPY;  Service: Cardiovascular;  Laterality: N/A;  . COLONOSCOPY    . DUPUYTREN CONTRACTURE RELEASE  2001   leftx2  . [place]   right  . DUPUYTREN CONTRACTURE RELEASE Right 05/02/2014   Procedure: EXCISION DUPUYTRENS RIGHT PALMAR/SMALL ;  Surgeon: Cammie Sickle, MD;  Location: [facility];  Service: Orthopedics;  Laterality: Right;  . TONSILLECTOMY  age 16    Prescriptions Prior to Admission  Medication Sig Dispense Refill Last Dose  . acetaminophen (TYLENOL) 325 MG tablet Take 650 mg by mouth every 6 (six) hours as needed for mild pain or headache.   Past Week at Unknown time  . allopurinol (ZYLOPRIM) 100 MG tablet Take 200 mg by mouth daily.    08/23/2017 at Unknown time  . anastrozole (ARIMIDEX) 1 MG tablet Take 1 tablet (1 mg total) by mouth daily. 90 tablet 1 08/23/2017 at Unknown time  . celecoxib (CELEBREX) 200 MG capsule Take 200 mg by mouth 2 (two) times daily as needed for mild pain.   08/23/2017 at Unknown time  . cyclobenzaprine (FLEXERIL) 5 MG tablet Take 5 mg by mouth 3 (three) times daily as needed for muscle spasms.   Past Week at Unknown time  . digoxin (LANOXIN) 0.125 MG tablet TAKE 0.5 TABLETS (0.0625 MG TOTAL) BY MOUTH DAILY. 45 tablet 2 08/23/2017 at Unknown time  . LIALDA 1.2 G EC tablet Take 4.8 g by mouth daily.  08/22/2017 at Unknown time  . metoprolol succinate (TOPROL-XL) 25 MG 24 hr tablet Take 1 tablet (25 mg total) by mouth daily. 90 tablet 3 08/22/2017 at 1800  . rivaroxaban (XARELTO) 20 MG TABS tablet Take 1 tablet by mouth  daily with supper 90 tablet 3 Taking  . simvastatin (ZOCOR) 20 MG tablet Take 20 mg by mouth daily.    08/22/2017 at Unknown time  . Vitamin D, Ergocalciferol, 2000 units CAPS Take 4,000 Units by mouth every Monday.   Past Week at Unknown time    SH: Social History  Substance Use Topics  . Smoking status: Former Smoker    Packs/day:  1.00    Years: 20.00    Types: Cigarettes    Quit date: 11/22/1992  . Smokeless tobacco: Never Used  . Alcohol use 1.8 oz/week    3 Standard drinks or equivalent per week     Comment: social    MEDS: Prior to Admission medications   Medication Sig Start Date End Date Taking? Authorizing Provider  acetaminophen (TYLENOL) 325 MG tablet Take 650 mg by mouth every 6 (six) hours as needed for mild pain or headache.   Yes [provider]  allopurinol (ZYLOPRIM) 100 MG tablet Take 200 mg by mouth daily.  06/21/13  Yes [provider]  anastrozole (ARIMIDEX) 1 MG tablet Take 1 tablet (1 mg total) by mouth daily. 02/25/17  Yes Truitt Merle, MD  celecoxib (CELEBREX) 200 MG capsule Take 200 mg by mouth 2 (two) times daily as needed for mild pain.   Yes [provider]  cyclobenzaprine (FLEXERIL) 5 MG tablet Take 5 mg by mouth 3 (three) times daily as needed for muscle spasms. 07/15/16  Yes [provider]  digoxin (LANOXIN) 0.125 MG tablet TAKE 0.5 TABLETS (0.0625 MG TOTAL) BY MOUTH DAILY. 07/18/17  Yes [name], MD  LIALDA 1.2 G EC tablet Take 4.8 g by mouth daily.  07/12/13  Yes [provider]  metoprolol succinate (TOPROL-XL) 25 MG 24 hr tablet Take 1 tablet (25 mg total) by mouth daily. 11/03/16  Yes Eileen Stanford, PA-C  rivaroxaban (XARELTO) 20 MG TABS tablet Take 1 tablet by mouth  daily with supper 11/24/16  Yes Eileen Stanford, PA-C  simvastatin (ZOCOR) 20 MG tablet Take 20 mg by mouth daily.  08/27/13  Yes [provider]  Vitamin D, Ergocalciferol, 2000 units CAPS Take 4,000 Units by mouth every Monday.   Yes [provider]    ALLERGY: No Known Allergies  Social History  Substance Use Topics  . Smoking status: Former Smoker    Packs/day: 1.00    Years: 20.00    Types: Cigarettes    Quit date: 11/22/1992  . Smokeless tobacco: Never Used  . Alcohol use 1.8 oz/week    3 Standard drinks or equivalent per week      Comment: social     Family History  Problem Relation Age of Onset  . Cirrhosis Mother   . Breast cancer Mother 56  . Heart failure Father   . Heart attack Father   . Breast cancer Sister 49  . Kidney Stones Sister        loss of kidney due to stones     ROS   Review of Systems  Constitutional: Negative.   HENT: Negative.   Eyes: Negative.   Respiratory: Negative.   Cardiovascular: Negative.   Gastrointestinal: Negative.   Genitourinary: Negative.   Musculoskeletal: Positive for back pain and myalgias.  Skin: Negative.   Neurological: Negative for dizziness, tingling, tremors, sensory change, speech change, focal weakness, seizures, loss of consciousness and headaches.  Endo/Heme/Allergies: Negative.     Exam   Vitals:   08/23/17 0957  BP: 128/86  Pulse: 81  Resp: 20  Temp: 97.6 F (36.4 C)  SpO2: 98%   General appearance: elderly female, NAD Eyes: PERRL, Fundoscopic: normal Cardiovascular: Regular rate and rhythm without murmurs, rubs, gallops. No edema or variciosities. Distal pulses normal. Pulmonary: Clear to auscultation Musculoskeletal:     Muscle tone upper extremities: Normal    Muscle tone lower extremities: Normal    Motor exam: Upper Extremities Deltoid Bicep Tricep Grip  Right 5/5 5/5 5/5 5/5  Left 5/5 5/5 5/5 5/5   Lower Extremity IP Quad PF DF EHL  Right 5/5 5/5 5/5 5/5 5/5  Left 5/5 5/5 5/5 5/5 5/5   Neurological Awake, alert, oriented Memory and concentration grossly intact Speech fluent, appropriate CNII: Visual fields normal CNIII/IV/VI: EOMI CNV: Facial sensation normal CNVII: Symmetric, normal strength CNVIII: Grossly normal CNIX: Normal palate movement CNXI: Trap and SCM strength normal CN XII: Tongue protrusion normal Sensation grossly intact to LT DTR: Normal Coordination (finger/nose & heel/shin): Normal  Results - Imaging/Labs   Results for orders placed or performed during the hospital encounter of 08/23/17 (from the  past 48 hour(s))  PT- INR Day of Surgery     Status: None   Collection Time: 08/23/17 10:17 AM  Result Value Ref Range   Prothrombin Time 14.4 11.4 - 15.2 seconds   INR 1.13     No results found.  Impression/Plan   76 y.o. female with L2 compression fracture and loosening of L1 and L2 hardware from previous L1-L5 posterior fixation and fusion. Because of her severe osteoporosis, I have rec removal of the hardware from L1-L5 without replacement as her bones would not be able to handle it. We have discussed risks, beneifts and alternatives to procedure. She wishes to proceed.   I have seen and evaluated the patient.  I have reviewed the above.  Proceeding to the OR.

## 2017-08-23 NOTE — Op Note (Signed)
08/23/2017  2:18 PM  PATIENT:  Michele Green  76 y.o. 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  She has had hardware pullout and loosening due to osteoporosis.  She has unrelenting back pain.  I recommended removal of her hardware.  Unfortunately because of her severe osteoporosis there is no option for internal fixation. Patient understood the risks, benefits, and alternatives and potential outcomes and wished to proceed.  PROCEDURE DETAILS: After smooth induction of general endotracheal anesthesia the patient was turned prone on a Wilson frame.  She was prepped and draped in the usual sterile fashion.  The existing scar was excised.  The soft tissue was dissected in the midline with monopolar cautery.  I encountered the cross connector in the midline.  I used monopolar cautery to dissect it free to both sides.  I encountered the rods laterally on each side.  I Continued to dissect along the rods cranially and caudally, exposing the pedicle screws at L1, L2, L4, and L5 bilaterally.  I removed the set screws then further dissected each rod free.  I then removed both rods with the cross connector together.  I then removed each screw, all of which were clearly loose.  I obtained hemostasis.  I irrigated with bacitracin saline.  A medium hemovac drain was placed beneath the fascia.  The wound was closed in routine anatomic layers with interrupted vicryl sutures.  Exparel was injected in the soft tissues.  The skin was closed with a running monocryl stratafix suture and dermabond.  She was  turned to the supine position on the bed and awoke without difficulty.  PATIENT DISPOSITION:  PACU - hemodynamically stable.   Delay start of Pharmacological VTE agent (>24hrs) due to surgical blood loss or risk of bleeding:  yes

## 2017-08-23 NOTE — Transfer of Care (Signed)
Immediate Anesthesia Transfer of Care Note  Patient: Michele Green  Procedure(s) Performed: Removal of Limbar one - Lumbar five  Internal fixation hardware (N/A Back)  Patient Location: PACU  Anesthesia Type:General  Level of Consciousness: awake, alert  and oriented  Airway & Oxygen Therapy: Patient Spontanous Breathing and Patient connected to nasal cannula oxygen  Post-op Assessment: Report given to RN, Post -op Vital signs reviewed and stable and Patient moving all extremities X 4  Post vital signs: Reviewed and stable  Last Vitals:  Vitals:   08/23/17 0957  BP: 128/86  Pulse: 81  Resp: 20  Temp: 36.4 C  SpO2: 98%    Last Pain:  Vitals:   08/23/17 0957  TempSrc: Oral         Complications: No apparent anesthesia complications

## 2017-08-23 NOTE — Telephone Encounter (Signed)
Called and left a message with a new date and time due to out of office  Fabricio Endsley

## 2017-08-23 NOTE — Anesthesia Postprocedure Evaluation (Signed)
Anesthesia Post Note  Patient: Michele Green  Procedure(s) Performed: Removal of Limbar one - Lumbar five  Internal fixation hardware (N/A Back)     Patient location during evaluation: PACU Anesthesia Type: General Level of consciousness: awake and alert Pain management: pain level controlled Vital Signs Assessment: post-procedure vital signs reviewed and stable Respiratory status: spontaneous breathing, nonlabored ventilation, respiratory function stable and patient connected to nasal cannula oxygen Cardiovascular status: blood pressure returned to baseline and stable Postop Assessment: no apparent nausea or vomiting Anesthetic complications: no    Last Vitals:  Vitals:   08/23/17 1352 08/23/17 1406  BP: (!) 105/58 (!) 102/57  Pulse: (!) 103 (!) 115  Resp: 17 19  Temp:    SpO2: 96% 95%    Last Pain:  Vitals:   08/23/17 1405  TempSrc:   PainSc: 5                  Deran Barro

## 2017-08-23 NOTE — Evaluation (Signed)
Physical Therapy Evaluation Patient Details Name: Michele Green MRN: [id number] DOB: January 11, 1941 Today's Date: 08/23/2017   History of Present Illness   Pt is a 76 y/o female who presents s/p L1-L5 hardware removal on 08/23/17. PMH significant for osteoporosis, breast CA s/p radiation, HTN.   Clinical Impression  Pt admitted with/for lumbar surgery above..  Pt currently limited functionally due to the problems listed below.  (see problems list.)  Pt will benefit from PT to maximize function and safety to be able to get home safely with available assist.     Follow Up Recommendations No PT follow up    Equipment Recommendations  None recommended by PT    Recommendations for Other Services       Precautions / Restrictions Precautions Precautions: Back Required Braces or Orthoses: Spinal Brace Spinal Brace: Thoracolumbosacral orthotic;Applied in sitting position      Mobility  Bed Mobility Overal bed mobility: Needs Assistance Bed Mobility: Rolling;Sidelying to Sit;Sit to Sidelying Rolling: Min guard Sidelying to sit: Min assist     Sit to sidelying: Min assist General bed mobility comments: discussed technique and assisted trunk and LE's as needed  Transfers Overall transfer level: Needs assistance   Transfers: Sit to/from Stand Sit to Stand: Supervision            Ambulation/Gait Ambulation/Gait assistance: Supervision Ambulation Distance (Feet): 300 Feet Assistive device: None Gait Pattern/deviations: Step-through pattern   Gait velocity interpretation: at or above normal speed for age/gender General Gait Details: steady and a little guarded  Stairs Stairs: Yes   Stair Management: One rail Left;Step to pattern;Sideways Number of Stairs: 3 General stair comments: safe with rail, good alternative technique  Wheelchair Mobility    Modified Rankin (Stroke Patients Only)       Balance Overall balance assessment: No apparent balance deficits (not  formally assessed)                                           Pertinent Vitals/Pain Pain Assessment: 0-10 Pain Score: 5  Pain Location: back Pain Descriptors / Indicators: Aching;Sore Pain Intervention(s): Monitored during session    Home Living Family/patient expects to be discharged to:: Private residence Living Arrangements: Alone Available Help at Discharge: Family;Available PRN/intermittently Type of Home: House Home Access: Stairs to enter Entrance Stairs-Rails: Psychiatric nurse of Steps: several Home Layout: Two level;Bed/bath upstairs Home Equipment: Walker - 2 wheels;Cane - single point;Bedside commode;Shower seat;Grab bars - tub/shower      Prior Function Level of Independence: Independent               Hand Dominance        Extremity/Trunk Assessment   Upper Extremity Assessment Upper Extremity Assessment: Overall WFL for tasks assessed    Lower Extremity Assessment Lower Extremity Assessment: Overall WFL for tasks assessed (proximal weakness bil)       Communication   Communication: No difficulties  Cognition Arousal/Alertness: Awake/alert Behavior During Therapy: WFL for tasks assessed/performed Overall Cognitive Status: Within Functional Limits for tasks assessed                                        General Comments General comments (skin integrity, edema, etc.): pt instructed in all back care/prec.    Exercises     Assessment/Plan  PT Assessment Patient needs continued PT services  PT Problem List Decreased activity tolerance;Decreased mobility;Decreased knowledge of use of DME;Decreased knowledge of precautions;Pain       PT Treatment Interventions Gait training;Functional mobility training;Therapeutic activities;Patient/family education;Stair training    PT Goals (Current goals can be found in the Care Plan section)  Acute Rehab PT Goals Patient Stated Goal: home independent.  info on strengthening bones PT Goal Formulation: With patient Time For Goal Achievement: 08/30/17 Potential to Achieve Goals: Fair    Frequency Min 5X/week   Barriers to discharge        Co-evaluation               AM-PAC PT "6 Clicks" Daily Activity  Outcome Measure Difficulty turning over in bed (including adjusting bedclothes, sheets and blankets)?: A Lot Difficulty moving from lying on back to sitting on the side of the bed? : Unable Difficulty sitting down on and standing up from a chair with arms (e.g., wheelchair, bedside commode, etc,.)?: Unable Help needed moving to and from a bed to chair (including a wheelchair)?: A Little Help needed walking in hospital room?: A Little Help needed climbing 3-5 steps with a railing? : A Little 6 Click Score: 13    End of Session Equipment Utilized During Treatment: Back brace Activity Tolerance: Patient tolerated treatment well Patient left: in bed;with call bell/phone within reach Nurse Communication: Mobility status PT Visit Diagnosis: Other abnormalities of gait and mobility (R26.89);Pain Pain - part of body:  (back)    Time: 5913-6859 PT Time Calculation (min) (ACUTE ONLY): 31 min   Charges:   PT Evaluation $PT Eval Low Complexity: 1 Low PT Treatments $Gait Training: 8-22 mins   PT G Codes:        2017-08-30  Donnella Sham, PT [phone]  (pager)  Tessie Fass Ameya Vowell 08/23/2017, 5:15 PM

## 2017-08-23 NOTE — Anesthesia Preprocedure Evaluation (Addendum)
Anesthesia Evaluation   Patient awake    Reviewed: Allergy & Precautions, NPO status , Patient's Chart, lab work & pertinent test results  Airway Mallampati: I  TM Distance: >3 FB Neck ROM: Full    Dental  (+) Teeth Intact, Dental Advidsory Given   Pulmonary former smoker,    breath sounds clear to auscultation       Cardiovascular hypertension, + dysrhythmias Atrial Fibrillation  Rhythm:Regular Rate:Normal     Neuro/Psych negative neurological ROS  negative psych ROS   GI/Hepatic negative GI ROS, Neg liver ROS,   Endo/Other  negative endocrine ROS  Renal/GU negative Renal ROS  negative genitourinary   Musculoskeletal negative musculoskeletal ROS (+)   Abdominal   Peds negative pediatric ROS (+)  Hematology negative hematology ROS (+)   Anesthesia Other Findings  CXR 04/20/17: Bronchitic changes. Aortic atherosclerosis.  EKG 05/17/17: atrial fibrillation (94 bpm). Nonspecific ST and T wave abnormality.   Holter monitor 09/06/16:   Continuous atrial fibrillation with average heart rate 106 bpm.  No bradycardia or pauses greater than 3 seconds.  Rare ventricular ectopy. - Continuous atrial fibrillation with poor rate control.  Echo 01/19/16:  - Left ventricle: The cavity size was normal. Wall thickness was normal. Systolic function was normal. The estimated ejection fraction was in the range of 60% to 65%. Left ventricular diastolic function parameters were normal. - Mitral valve: MV is thickened, difficult to see well. Cannot exclude some prolapse. MR appears more moderate, some coursing posterior into LA. There was moderate regurgitation. - Left atrium: The atrium was severely dilated.   Reproductive/Obstetrics negative OB ROS                           Lab Results  Component Value Date   WBC 2.9 (L) 08/17/2017   HGB 12.7 08/17/2017   HCT 37.9 08/17/2017   MCV 105.3 (H)  08/17/2017   PLT 100 (L) 08/17/2017   Lab Results  Component Value Date   CREATININE 0.69 08/17/2017   BUN 11 08/17/2017   NA 140 08/17/2017   K 3.8 08/17/2017   CL 109 08/17/2017   CO2 26 08/17/2017   No results found for: INR, PROTIME  EKG: normal sinus rhythm.  12/2015 Echo - Left ventricle: The cavity size was normal. Wall thickness was normal. Systolic function was normal. The estimated ejection fraction was in the range of 60% to 65%. Left ventricular diastolic function parameters were normal. - Mitral valve: MV is thickened, difficult to see well Cannot exclude some prolapse. MR appears more moderate, some coursing posterior into LA. There was moderate regurgitation. - Left atrium: The atrium was severely dilated.     Anesthesia Physical  Anesthesia Plan  ASA: III  Anesthesia Plan: General   Post-op Pain Management:    Induction: Intravenous  PONV Risk Score and Plan: 2 and Ondansetron, Dexamethasone, Treatment may vary due to age or medical condition and Midazolam  Airway Management Planned: Oral ETT  Additional Equipment:   Intra-op Plan:   Post-operative Plan: Extubation in OR  Informed Consent: I have reviewed the patients History and Physical, chart, labs and discussed the procedure including the risks, benefits and alternatives for the proposed anesthesia with the patient or authorized representative who has indicated his/her understanding and acceptance.   Dental advisory given and Dental Advisory Given  Plan Discussed with: CRNA  Anesthesia Plan Comments:        Anesthesia Quick Evaluation

## 2017-08-23 NOTE — Anesthesia Procedure Notes (Signed)
Procedure Name: Intubation Date/Time: 08/23/2017 11:37 AM Performed by: Neldon Newport Pre-anesthesia Checklist: Timeout performed, Patient being monitored, Suction available, Emergency Drugs available and Patient identified Patient Re-evaluated:Patient Re-evaluated prior to induction Oxygen Delivery Method: Circle system utilized Preoxygenation: Pre-oxygenation with 100% oxygen Induction Type: IV induction Ventilation: Mask ventilation without difficulty Laryngoscope Size: Mac and 3 Grade View: Grade II Tube type: Oral Tube size: 7.0 mm Number of attempts: 1 Placement Confirmation: breath sounds checked- equal and bilateral,  positive ETCO2 and ETT inserted through vocal cords under direct vision Secured at: 22 cm Tube secured with: Tape Dental Injury: Teeth and Oropharynx as per pre-operative assessment

## 2017-08-23 NOTE — Brief Op Note (Signed)
08/23/2017  1:33 PM  PATIENT:  Michele Green  76 y.o. female  PRE-OPERATIVE DIAGNOSIS:  Low back pain  POST-OPERATIVE DIAGNOSIS:  Low back pain  PROCEDURE:  Procedure(s) with comments: Removal of Limbar one - Lumbar five  Internal fixation hardware (N/A) - R  SURGEON:  Surgeon(s) and Role:    * Ditty, Kevan Ny, MD - Primary  PHYSICIAN ASSISTANT: Ferne Reus, PA-C  ANESTHESIA:   general  EBL:  Total I/O In: 1561.4 [I.V.:1561.4] Out: 200 [Blood:200]  BLOOD ADMINISTERED:none  DRAINS: Medium hemovac   LOCAL MEDICATIONS USED:  MARCAINE    and BUPIVICAINE   SPECIMEN:  No Specimen  DISPOSITION OF SPECIMEN:  N/A  COUNTS:  YES  TOURNIQUET:  * No tourniquets in log *  DICTATION: .Note written in EPIC  PLAN OF CARE: Admit to inpatient   PATIENT DISPOSITION:  PACU - hemodynamically stable.   Delay start of Pharmacological VTE agent (>24hrs) due to surgical blood loss or risk of bleeding: yes

## 2017-08-24 LAB — BASIC METABOLIC PANEL
Anion gap: 10 (ref 5–15)
BUN: 15 mg/dL (ref 6–20)
CO2: 22 mmol/L (ref 22–32)
Calcium: 7.9 mg/dL — ABNORMAL LOW (ref 8.9–10.3)
Chloride: 105 mmol/L (ref 101–111)
Creatinine, Ser: 0.76 mg/dL (ref 0.44–1.00)
GFR calc Af Amer: >60 mL/min (ref >60)
GFR calc non Af Amer: >60 mL/min (ref >60)
Glucose, Bld: 128 mg/dL — ABNORMAL HIGH (ref 65–99)
Potassium: 4.2 mmol/L (ref 3.5–5.1)
Sodium: 137 mmol/L (ref 135–145)

## 2017-08-24 LAB — CBC
HCT: 27.6 % — ABNORMAL LOW (ref 36.0–46.0)
Hemoglobin: 9.2 g/dL — ABNORMAL LOW (ref 12.0–15.0)
MCH: 35.2 pg — ABNORMAL HIGH (ref 26.0–34.0)
MCHC: 33.3 g/dL (ref 30.0–36.0)
MCV: 105.7 fL — ABNORMAL HIGH (ref 78.0–100.0)
Platelets: 103 10*3/uL — ABNORMAL LOW (ref 150–400)
RBC: 2.61 MIL/uL — ABNORMAL LOW (ref 3.87–5.11)
RDW: 13.8 % (ref 11.5–15.5)
WBC: 6.9 10*3/uL (ref 4.0–10.5)

## 2017-08-24 LAB — PROTIME-INR
INR: 1.19
Prothrombin Time: 15 seconds (ref 11.4–15.2)

## 2017-08-24 LAB — APTT: aPTT: 30 s (ref 24–36)

## 2017-08-24 MED ORDER — OXYCODONE-ACETAMINOPHEN 7.5-325 MG PO TABS: 1.0000 | ORAL_TABLET | ORAL | 0 refills | Status: DC | PRN

## 2017-08-24 NOTE — Progress Notes (Signed)
Physical Therapy Treatment Patient Details Name: Michele Green MRN: [id number] DOB: 01-24-1941 Today's Date: 08/24/2017    History of Present Illness Pt is a 76 y/o female who presents s/p L1-L5 hardware removal on 08/23/17. PMH significant for osteoporosis, breast CA s/p radiation, HTN.     PT Comments    Pt progressing towards physical therapy goals. Was able to ambulate well this session with no noted unsteadiness or LOB. Pt eager for d/c home this morning. Was able to recall 3/3 precautions without cues, and maintained well throughout functional mobility. Will continue to follow.    Follow Up Recommendations  No PT follow up     Equipment Recommendations  None recommended by PT    Recommendations for Other Services       Precautions / Restrictions Precautions Precautions: Fall;Back Precaution Booklet Issued: Yes (comment) Precaution Comments: Reviewed precautions sheet with pt and pt was cued for precautions during functional mobility.  Required Braces or Orthoses: Spinal Brace Spinal Brace: Thoracolumbosacral orthotic;Applied in sitting position Restrictions Weight Bearing Restrictions: No    Mobility  Bed Mobility Overal bed mobility: Modified Independent Bed Mobility: Rolling;Sidelying to Sit;Sit to Sidelying           General bed mobility comments: Increased time however pt able to complete without assistance and without difficulty. Good demonstration of log roll technique.   Transfers Overall transfer level: Modified independent Equipment used: None Transfers: Sit to/from Stand           General transfer comment: Pt demonstrated proper hand placement on seated surface for safety.   Ambulation/Gait Ambulation/Gait assistance: Modified independent (Device/Increase time) Ambulation Distance (Feet): 300 Feet Assistive device: None Gait Pattern/deviations: Step-through pattern Gait velocity: Decreased Gait velocity interpretation: Below normal speed  for age/gender General Gait Details: Slow but steady. Pt ambulating well with good maintenance of precautions.    Stairs Stairs: Yes   Stair Management: One rail Left;Step to pattern;Sideways Number of Stairs: 10 General stair comments: safe with rail, good alternative technique  Wheelchair Mobility    Modified Rankin (Stroke Patients Only)       Balance Overall balance assessment: No apparent balance deficits (not formally assessed)                                          Cognition Arousal/Alertness: Awake/alert Behavior During Therapy: WFL for tasks assessed/performed Overall Cognitive Status: Within Functional Limits for tasks assessed                                        Exercises      General Comments        Pertinent Vitals/Pain Pain Assessment: 0-10 Pain Score: 6  Pain Location: back Pain Descriptors / Indicators: Aching;Sore Pain Intervention(s): Limited activity within patient's tolerance;Monitored during session;Repositioned    Home Living                      Prior Function            PT Goals (current goals can now be found in the care plan section) Acute Rehab PT Goals Patient Stated Goal: home independent. info on strengthening bones PT Goal Formulation: With patient Time For Goal Achievement: 08/30/17 Potential to Achieve Goals: Good Progress towards PT goals: Progressing toward goals  Frequency    Min 5X/week      PT Plan Current plan remains appropriate    Co-evaluation              AM-PAC PT "6 Clicks" Daily Activity  Outcome Measure  Difficulty turning over in bed (including adjusting bedclothes, sheets and blankets)?: None Difficulty moving from lying on back to sitting on the side of the bed? : None Difficulty sitting down on and standing up from a chair with arms (e.g., wheelchair, bedside commode, etc,.)?: None Help needed moving to and from a bed to chair (including  a wheelchair)?: None Help needed walking in hospital room?: None Help needed climbing 3-5 steps with a railing? : A Little 6 Click Score: 23    End of Session Equipment Utilized During Treatment: Back brace Activity Tolerance: Patient tolerated treatment well Patient left: in chair;with call bell/phone within reach Nurse Communication: Mobility status PT Visit Diagnosis: Other abnormalities of gait and mobility (R26.89);Pain Pain - part of body:  (back)     Time: 8864-8472 PT Time Calculation (min) (ACUTE ONLY): 16 min  Charges:  $Gait Training: 8-22 mins                    G Codes:       Rolinda Roan, PT, DPT Acute Rehabilitation Services Pager: [phone]    Thelma Comp 08/24/2017, 9:06 AM

## 2017-08-24 NOTE — Discharge Summary (Signed)
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  They were discharged in stable condition.  Follow up: 3 weeks  Allergies as of 08/24/2017   No Known Allergies     Medication List    TAKE these medications   acetaminophen 325 MG tablet Commonly known as:  TYLENOL Take 650 mg by mouth every 6 (six) hours as needed for mild pain or headache.   allopurinol 100 MG tablet Commonly known as:  ZYLOPRIM Take 200 mg by mouth daily.   anastrozole 1 MG tablet Commonly known as:  ARIMIDEX Take 1 tablet (1 mg total) by mouth daily.   celecoxib 200 MG capsule Commonly known as:  CELEBREX Take 200 mg by mouth 2 (two) times daily as needed for mild pain.   cyclobenzaprine 5 MG tablet Commonly known as:  FLEXERIL Take 5 mg by mouth 3 (three) times daily as needed for muscle spasms.   digoxin 0.125 MG tablet Commonly known as:  LANOXIN TAKE 0.5 TABLETS (0.0625 MG TOTAL) BY MOUTH DAILY.   LIALDA 1.2 g EC tablet Generic drug:  mesalamine Take 4.8 g by mouth daily.   metoprolol succinate 25 MG 24 hr tablet Commonly known as:  TOPROL-XL Take 1 tablet (25 mg total) by mouth daily.   oxyCODONE-acetaminophen 7.5-325 MG tablet Commonly known as:  PERCOCET Take 1-2 tablets by mouth every 4 (four) hours as needed for severe pain.   rivaroxaban 20 MG Tabs tablet Commonly known as:  XARELTO Take 1 tablet by mouth  daily with supper   simvastatin 20 MG tablet Commonly known as:  ZOCOR Take 20 mg by mouth daily.   Vitamin D (Ergocalciferol) 2000 units Caps Take 4,000 Units by mouth every Monday.

## 2017-08-24 NOTE — Progress Notes (Signed)
Patient alert and oriented, mae's well, voiding adequate amount of urine, swallowing without difficulty, no c/o pain at time of discharge. Patient discharged home with family. Script and discharged instructions given to patient. Patient and family stated understanding of instructions given. Patient has an appointment with Dr. Cyndy Freeze

## 2017-08-24 NOTE — Telephone Encounter (Signed)
Pt called asking for Webb Silversmith regarding change in appt.  Returned call & gave new appt times.

## 2017-08-24 NOTE — Evaluation (Signed)
Occupational Therapy Evaluation and Discharge Patient Details Name: Michele Green MRN: [id number] DOB: 07-13-1941 Today's Date: 08/24/2017    History of Present Illness Pt is a 76 y/o female who presents s/p L1-L5 hardware removal on 08/23/17. PMH significant for osteoporosis, breast CA s/p radiation, HTN.    Clinical Impression   Pt reports she was independent with ADL PTA. Currently pt mod I with ADL and functional mobility. All back, safety, and ADL education completed with pt. Pt planning to d/c home with 24/7 supervision from family. No further acute OT needs identified; signing off at this time. Please re-consult if needs change. Thank you for this referral.     Follow Up Recommendations  No OT follow up;Supervision - Intermittent    Equipment Recommendations  None recommended by OT    Recommendations for Other Services       Precautions / Restrictions Precautions Precautions: Fall;Back Precaution Booklet Issued: No Precaution Comments: Pt able to recall 3/3 precautions at start of session. Required Braces or Orthoses: Spinal Brace Spinal Brace: Thoracolumbosacral orthotic;Applied in sitting position Restrictions Weight Bearing Restrictions: No      Mobility Bed Mobility Overal bed mobility: Modified Independent            General bed mobility comments: Pt sitting EOB upon arrival  Transfers Overall transfer level: Modified independent Equipment used: None Transfers: Sit to/from Stand           General transfer comment: Pt demonstrated proper hand placement on seated surface for safety.     Balance Overall balance assessment: No apparent balance deficits (not formally assessed)                                         ADL either performed or assessed with clinical judgement   ADL Overall ADL's : Modified independent                                       General ADL Comments: Reviewed managing ADL while  maintaining back precautions, keeping frquently used items at couner top height, management of back brace, and frequent mobility throughout the day upon return home.     Vision         Perception     Praxis      Pertinent Vitals/Pain Pain Assessment: 0-10 Pain Score: 4  Pain Location: back Pain Descriptors / Indicators: Aching;Sore Pain Intervention(s): Monitored during session     Hand Dominance     Extremity/Trunk Assessment Upper Extremity Assessment Upper Extremity Assessment: Overall WFL for tasks assessed   Lower Extremity Assessment Lower Extremity Assessment: Defer to PT evaluation   Cervical / Trunk Assessment Cervical / Trunk Assessment: Other exceptions Cervical / Trunk Exceptions: s/p spinal sx   Communication Communication Communication: No difficulties   Cognition Arousal/Alertness: Awake/alert Behavior During Therapy: WFL for tasks assessed/performed Overall Cognitive Status: Within Functional Limits for tasks assessed                                     General Comments       Exercises     Shoulder Instructions      Home Living Family/patient expects to be discharged to:: Private residence Living Arrangements: Alone Available Help at  Discharge: Family;Available PRN/intermittently Type of Home: House Home Access: Stairs to enter [employer] of Steps: several Entrance Stairs-Rails: Right;Left Home Layout: Two level;Bed/bath upstairs Alternate Level Stairs-Number of Steps: 10 Alternate Level Stairs-Rails: Left Bathroom Shower/Tub: Walk-in shower (zero entry)   Bathroom Toilet: Standard     Home Equipment: Environmental consultant - 2 wheels;Cane - single point;Bedside commode;Shower seat;Grab bars - tub/shower          Prior Functioning/Environment Level of Independence: Independent                 OT Problem List:        OT Treatment/Interventions:      OT Goals(Current goals can be found in the care plan  section) Acute Rehab OT Goals Patient Stated Goal: home independent. info on strengthening bones OT Goal Formulation: All assessment and education complete, DC therapy  OT Frequency:     Barriers to D/C:            Co-evaluation              AM-PAC PT "6 Clicks" Daily Activity     Outcome Measure Help from another person eating meals?: None Help from another person taking care of personal grooming?: None Help from another person toileting, which includes using toliet, bedpan, or urinal?: None Help from another person bathing (including washing, rinsing, drying)?: None Help from another person to put on and taking off regular upper body clothing?: None Help from another person to put on and taking off regular lower body clothing?: None 6 Click Score: 24   End of Session Equipment Utilized During Treatment: Back brace Nurse Communication: Mobility status;Other (comment) (no equipment or f/u needs)  Activity Tolerance: Patient tolerated treatment well Patient left: in chair;with call bell/phone within reach  OT Visit Diagnosis: Pain Pain - part of body:  (back)                Time: 6283-1517 OT Time Calculation (min): 10 min Charges:  OT General Charges $OT Visit: 1 Visit OT Evaluation $OT Eval Low Complexity: 1 Low G-Codes:     Shamila Lerch A. Ulice Brilliant, M.S., OTR/L Pager: [place] 08/24/2017, 10:16 AM

## 2017-09-04 ENCOUNTER — Other Ambulatory Visit: Payer: Self-pay | Admitting: Hematology

## 2017-09-04 DIAGNOSIS — Z170 Estrogen receptor positive status [ER+]: Principal | ICD-10-CM

## 2017-09-04 DIAGNOSIS — C50412 Malignant neoplasm of upper-outer quadrant of left female breast: Secondary | ICD-10-CM

## 2017-09-09 ENCOUNTER — Ambulatory Visit: Payer: Medicare Other | Admitting: Hematology

## 2017-09-09 ENCOUNTER — Other Ambulatory Visit: Payer: Medicare Other

## 2017-09-29 ENCOUNTER — Ambulatory Visit: Payer: Medicare Other | Admitting: Endocrinology

## 2017-09-29 ENCOUNTER — Encounter: Payer: Self-pay | Admitting: Endocrinology

## 2017-09-29 DIAGNOSIS — M810 Age-related osteoporosis without current pathological fracture: Secondary | ICD-10-CM

## 2017-09-29 LAB — VITAMIN D 25 HYDROXY (VIT D DEFICIENCY, FRACTURES): VITD: 30.72 ng/mL (ref 30.00–100.00)

## 2017-09-29 LAB — TSH: TSH: 1.96 u[IU]/mL (ref 0.35–4.50)

## 2017-09-29 NOTE — Patient Instructions (Addendum)
blood tests are requested for you today.  We'll let you know about the results. Based on the results, options are Forteo, vitamin-D, and resuming the fosamax.  We may need to do all of these.   Please continue the same Prolia.  Take calcium 1200 mg per day, and vitamin-D, 1000 units per day.  It is critically important to prevent falling down (keep floor areas well-lit, dry, and free of loose objects.  If you have a cane, walker, or wheelchair, you should use it, even for short trips around the house.  Wear flat-soled shoes.  Also, try not to rush). Please come back for a follow-up appointment in 6 months

## 2017-09-29 NOTE — Progress Notes (Signed)
Subjective:    Patient ID: Michele Green, female    DOB: 08/29/1941, 76 y.o.   MRN: [id number]  HPI Pt is referred by Dr Cyndy Freeze, for osteoporosis.  Pt was noted to have osteopenia in 2002.   She has no history of any of the following: early menopause, multiple myeloma, renal dz, thyroid problems, prolonged bedrest, steroids, smoking, liver dz, or primary hyperparathyroidism.  She does not take heparin or anticonvulsants.  She was noted to have L-3 comp fx in 2017, after a fall.  She fx several more lumbar vertebrae in mid-2018, due to another fall.  She takes low-dose ca++ and vit-D. She took ERT for approx 5 years.  She took fosamax 2002-2008, but not since.  she has never had any other bony fracture.  She took prescription vit-D in 2017, x 1-2 months.  She drinks 2/week.  She has moderate pain at the lower back, but no assoc bowel or bladder retention.  She got 1st dose of prolia a few mos ago.   Past Medical History:  Diagnosis Date  . Breast cancer ([place])   . Breast cancer of upper-outer quadrant of left female breast ([facility]) 02/02/2016  . Colitis   . Colon polyp 02/2012  . Dupuytren contracture    bilateral hands  . Dysrhythmia   . Family history of breast cancer   . Hypertension   . On continuous oral anticoagulation 08/22/2015   Started on Xarelto 08/12/2015   . Osteopenia   . Persistent atrial fibrillation ([place]) 08/22/2015   Started late August or early September.   . Radiation 04/21/16-05/19/16   left breast 47.72 Gy, boosted to 10 Gy  . Wears glasses     Past Surgical History:  Procedure Laterality Date  . COLONOSCOPY    . DUPUYTREN CONTRACTURE RELEASE  2001   leftx2  . [place]   right  . TONSILLECTOMY  age 51    Social History   Socioeconomic History  . Marital status: Married    Spouse name: Not on file  . Number of children: 1  . Years of education: Not on file  . Highest education level: Not on file  Social Needs  . Financial resource  strain: Not on file  . Food insecurity - worry: Not on file  . Food insecurity - inability: Not on file  . Transportation needs - medical: Not on file  . Transportation needs - non-medical: Not on file  Occupational History  . Not on file  Tobacco Use  . Smoking status: Former Smoker    Packs/day: 1.00    Years: 20.00    Pack years: 20.00    Types: Cigarettes    Last attempt to quit: 11/22/1992    Years since quitting: 24.8  . Smokeless tobacco: Never Used  Substance and Sexual Activity  . Alcohol use: Yes    Alcohol/week: 1.8 oz    Types: 3 Standard drinks or equivalent per week    Comment: social  . Drug use: No  . Sexual activity: No    Partners: Male    Birth control/protection: Post-menopausal  Other Topics Concern  . Not on file  Social History Narrative  . Not on file    Current Outpatient Medications on File Prior to Visit  Medication Sig Dispense Refill  . acetaminophen (TYLENOL) 325 MG tablet Take 650 mg by mouth every 6 (six) hours as needed for mild pain or headache.    . allopurinol (ZYLOPRIM) 100 MG  tablet Take 200 mg by mouth daily.     . celecoxib (CELEBREX) 200 MG capsule Take 200 mg by mouth 2 (two) times daily as needed for mild pain.    . cyclobenzaprine (FLEXERIL) 5 MG tablet Take 5 mg by mouth 3 (three) times daily as needed for muscle spasms.    . digoxin (LANOXIN) 0.125 MG tablet TAKE 0.5 TABLETS (0.0625 MG TOTAL) BY MOUTH DAILY. 45 tablet 2  . LIALDA 1.2 G EC tablet Take 4.8 g by mouth daily.     . metoprolol succinate (TOPROL-XL) 25 MG 24 hr tablet Take 1 tablet (25 mg total) by mouth daily. 90 tablet 3  . oxyCODONE-acetaminophen (PERCOCET) 7.5-325 MG tablet Take 1-2 tablets by mouth every 4 (four) hours as needed for severe pain. 60 tablet 0  . rivaroxaban (XARELTO) 20 MG TABS tablet Take 1 tablet by mouth  daily with supper 90 tablet 3  . simvastatin (ZOCOR) 20 MG tablet Take 20 mg by mouth daily.     . Vitamin D, Ergocalciferol, 2000 units CAPS  Take 4,000 Units by mouth every Monday.     No current facility-administered medications on file prior to visit.     No Known Allergies  Family History  Problem Relation Age of Onset  . Cirrhosis Mother   . Breast cancer Mother 82  . Heart failure Father   . Heart attack Father   . Breast cancer Sister 36  . Kidney Stones Sister        loss of kidney due to stones  . Osteoporosis Neg Hx     BP (!) 104/58   Pulse (!) 105   Wt 128 lb (58.1 kg)   SpO2 96%   BMI 21.97 kg/m     Review of Systems denies weight loss, hematuria, heartburn, cold intolerance, edema, skin rash, insomnia, cramps, memory loss, and rhinorrhea.  She has insomnia and easy bruising.    Objective:   Physical Exam VS: see vs page GEN: no distress HEAD: head: no deformity eyes: no periorbital swelling, no proptosis external nose and ears are normal mouth: no lesion seen NECK: supple, thyroid is not enlarged CHEST WALL: no deformity.  No kyphosis LUNGS: clear to auscultation CV: reg rate and rhythm, no murmur ABD: abdomen is soft, nontender.  no hepatosplenomegaly.  not distended.  no hernia MUSCULOSKELETAL: muscle bulk and strength are grossly normal.  no obvious joint swelling.  gait is normal and steady.  Old healed surgical scar at the lumbar spine, but nontender EXTEMITIES: no deformity.  no ulcer on the feet.  feet are of normal color and temp.  no edema PULSES: dorsalis pedis intact bilat.  no carotid bruit NEURO:  cn 2-12 grossly intact.   readily moves all 4's.  sensation is intact to touch on the feet SKIN:  Normal texture and temperature.  No rash or suspicious lesion is visible.   NODES:  None palpable at the neck PSYCH: alert, well-oriented.  Does not appear anxious nor depressed.    Lab Results  Component Value Date   CREATININE 0.76 08/24/2017   BUN 15 08/24/2017   NA 137 08/24/2017   K 4.2 08/24/2017   CL 105 08/24/2017   CO2 22 08/24/2017     Lab Results  Component Value  Date   ALT 15 03/04/2017   AST 24 03/04/2017   ALKPHOS 85 03/04/2017   BILITOT 0.74 03/04/2017     CT: IMPRESSION: 1. Stable L2 and L3 vertebral body compression deformities and  retropulsion of superior endplates. No acute fracture identified. 2. L1-L5 posterior instrumented fusion.  Hardware appears intact. 3. L1 pedicle screw loosening with slight superior posterior migration of the screws. 4. L4 and L5 pedicle screw loosening without migration.  DEXA:  The BMD measured at Femur Neck Left is 0.581 g/cm2 with a T-score of -3.3. This patient is considered osteoporotic according to [facility]) criteria. Lumbar spine was not utilized due to surgical hardware. There has been a statistically significant decrease in BMD of the left hip since prior exam dated 11/11/2008. Patient does not meet criteria for FRAX assessment.  I have reviewed outside records, and summarized: Pt was noted to have osteoporosis, and referred here.   Pt had severe back pain, but osteoporosis was limiting rx options for her loose hardware.      Assessment & Plan:  Osteoporosis, new to me, uncertain etiology.    Patient Instructions  blood tests are requested for you today.  We'll let you know about the results. Based on the results, options are Forteo, vitamin-D, and resuming the fosamax.  We may need to do all of these.   Please continue the same Prolia.  Take calcium 1200 mg per day, and vitamin-D, 1000 units per day.  It is critically important to prevent falling down (keep floor areas well-lit, dry, and free of loose objects.  If you have a cane, walker, or wheelchair, you should use it, even for short trips around the house.  Wear flat-soled shoes.  Also, try not to rush). Please come back for a follow-up appointment in 6 months

## 2017-09-29 NOTE — Progress Notes (Signed)
[place]  [phone] [phone]  Clinic Follow Up Note   Patient Care Team: [name], MD as PCP - General (Family Medicine) [name], MD as Consulting Physician (Cardiology) 09/30/2017  CHIEF COMPLAINTS:  Follow up  left breast cancer  Oncology History   Breast cancer of upper-outer quadrant of left female breast [facility])   Staging form: Breast, AJCC 7th Edition   - Clinical stage from 01/28/2016: Stage IA (T1a, N0, M0) - Signed by Truitt Merle, MD on 02/03/2016   - Pathologic stage from 02/23/2016: Stage IA (T1b, N0, cM0) - Signed by Truitt Merle, MD on 06/14/2016        Breast cancer of upper-outer quadrant of left female breast ([place])   01/14/2016 Mammogram    Screening mammogram showed possible mass with distortion in the left breast.      01/23/2016 Imaging    Diagnostic mammogram and ultrasound of the left breast showed a 3-4 mm shadowing mass at 1:00 in the left breast, no adenopathy.      01/28/2016 Initial Diagnosis    Breast cancer of upper-outer quadrant of left female breast ([place])      01/28/2016 Initial Biopsy    Left breast mass biopsy showed invasive ductal carcinoma, grade 1.      01/28/2016 Receptors her2    Your 100% positive, PR 100% positive, HER-2 negative, Ki-67 5%      02/23/2016 Surgery    Left breast lumpectomy, and sentinel lymph node biopsy      02/23/2016 Pathology Results    Left breast lumpectomy showed a 1.0 cm invasive ductal carcinoma, grade 1, no lymphvascular invasion, invasive carcinoma is broadly less than 0.1 cm to the anterior inferior margin, ADH, ALH, 1 node(-). Left inferior reexcision margin showed LCIS       04/21/2016 - 05/19/2016 Radiation Therapy    adjuvant breast radiation       06/10/2016 -  Anti-estrogen oral therapy    Anastrozole 1 mg daily, changed to  Tamoxifen due to osteoporosis on 09/30/17        02/03/2017 Mammogram    Bilateral diagnostic mammogram 02/03/17 IMPRESSION: Expected  surgical changes in the upper-outer quadrant of the left breast. No mammographic evidence of malignancy in the bilateral breasts.        HISTORY OF PRESENTING ILLNESS:  Michele Green 76 y.o. female is here because of newly diagnosed left breast cancer. She is accompanied by her husband to our multidisciplinary rest clinic today.  This was discovered by screening mammo, she denies any palpable breast mass, skin change or nipple discharge, she feels well, no complains. She remains to be physically active.   She  was found to have AF and had cardioverstion in 09/2015,  she has been on amiodarone and Xarelto since then. She also has had colitis 2-3 yeears, on liada,  she follows up with gastroenterologist Dr. Amedeo Plenty.  GYN HISTORY  Menarchal: 12 LMP: 48 Contraceptive: 8 HRT: 5 years, stopped when she was 37 G1P1: no breast feeding      CURRENTLY THERAPY: Anastrozole 96m daily, started in 05/2016, changed to Tamoxifen due to osteoporosis on 09/30/17  Prolia injections with Dr. sBrigitte Pulse INTERIM HISTORY:  [facility] for follow[facility]up. She was last seen by me 6 months ago. She presents to the clinic today noting she had back surgery to remove screws recently. She uses back brace currently as long as she can tolerate it. This brace rubs on her breast incision.  Her spouse passed away recently and she is coping. She has a sister here who can help her.  She is taking anastrozole with occasional hot flashes. Her joints are fine. Her last Dexa was last month. Overall she is clinically doing well.    MEDICAL HISTORY:  Past Medical History:  Diagnosis Date  . Breast cancer ([place])   . Breast cancer of upper-outer quadrant of left female breast ([place]) 02/02/2016  . Colitis   . Colon polyp 02/2012  . Dupuytren contracture    bilateral hands  . Dysrhythmia   . Family history of breast cancer   . Hypertension   . On continuous oral anticoagulation 08/22/2015   Started on Xarelto 08/12/2015   .  Osteopenia   . Persistent atrial fibrillation ([place]) 08/22/2015   Started late August or early September.   . Radiation 04/21/16-05/19/16   left breast 47.72 Gy, boosted to 10 Gy  . Wears glasses     SURGICAL HISTORY: Past Surgical History:  Procedure Laterality Date  . COLONOSCOPY    . DUPUYTREN CONTRACTURE RELEASE  2001   leftx2  . [place]   right  . TONSILLECTOMY  age 55    SOCIAL HISTORY: Social History   Socioeconomic History  . Marital status: Married    Spouse name: Not on file  . Number of children: 1  . Years of education: Not on file  . Highest education level: Not on file  Social Needs  . Financial resource strain: Not on file  . Food insecurity - worry: Not on file  . Food insecurity - inability: Not on file  . Transportation needs - medical: Not on file  . Transportation needs - non-medical: Not on file  Occupational History  . Not on file  Tobacco Use  . Smoking status: Former Smoker    Packs/day: 1.00    Years: 20.00    Pack years: 20.00    Types: Cigarettes    Last attempt to quit: 11/22/1992    Years since quitting: 24.8  . Smokeless tobacco: Never Used  Substance and Sexual Activity  . Alcohol use: Yes    Alcohol/week: 1.8 oz    Types: 3 Standard drinks or equivalent per week    Comment: social  . Drug use: No  . Sexual activity: No    Partners: Male    Birth control/protection: Post-menopausal  Other Topics Concern  . Not on file  Social History Narrative  . Not on file    FAMILY HISTORY: Family History  Problem Relation Age of Onset  . Cirrhosis Mother   . Breast cancer Mother 82  . Heart failure Father   . Heart attack Father   . Breast cancer Sister 19  . Kidney Stones Sister        loss of kidney due to stones  . Osteoporosis Neg Hx     ALLERGIES:  has No Known Allergies.  MEDICATIONS:  Current Outpatient Medications  Medication Sig Dispense Refill  . acetaminophen (TYLENOL) 325 MG tablet Take  650 mg by mouth every 6 (six) hours as needed for mild pain or headache.    . allopurinol (ZYLOPRIM) 100 MG tablet Take 200 mg by mouth daily.     . celecoxib (CELEBREX) 200 MG capsule Take 200 mg by mouth 2 (two) times daily as needed for mild pain.    . cyclobenzaprine (FLEXERIL) 5 MG tablet Take 5 mg by mouth 3 (three) times daily as needed for muscle spasms.    [name]  digoxin (LANOXIN) 0.125 MG tablet TAKE 0.5 TABLETS (0.0625 MG TOTAL) BY MOUTH DAILY. 45 tablet 2  . LIALDA 1.2 G EC tablet Take 4.8 g by mouth daily.     . metoprolol succinate (TOPROL-XL) 25 MG 24 hr tablet Take 1 tablet (25 mg total) by mouth daily. 90 tablet 3  . oxyCODONE-acetaminophen (PERCOCET) 7.5-325 MG tablet Take 1-2 tablets by mouth every 4 (four) hours as needed for severe pain. 60 tablet 0  . rivaroxaban (XARELTO) 20 MG TABS tablet Take 1 tablet by mouth  daily with supper 90 tablet 3  . simvastatin (ZOCOR) 20 MG tablet Take 20 mg by mouth daily.     . Vitamin D, Ergocalciferol, 2000 units CAPS Take 4,000 Units by mouth every Monday.    . calcitRIOL (ROCALTROL) 0.25 MCG capsule Take 1 capsule (0.25 mcg total) daily by mouth. 30 capsule 11  . tamoxifen (NOLVADEX) 20 MG tablet Take 1 tablet (20 mg total) daily by mouth. 30 tablet 3   No current facility-administered medications for this visit.     REVIEW OF SYSTEMS: Constitutional: Denies fevers, chills or abnormal night sweats Eyes: Denies blurriness of vision, double vision or watery eyes Ears, nose, mouth, throat, and face: Denies mucositis or sore throat Respiratory: Denies cough, dyspnea or wheezes Cardiovascular: Denies palpitation, chest discomfort or lower extremity swelling Gastrointestinal:  Denies nausea, heartburn or change in bowel habits Skin: Denies abnormal skin rashes Lymphatics: Denies new lymphadenopathy or easy bruising Neurological:Denies numbness, tingling or new weaknesses Musculoskeletal: negative  Behavioral/Psych: Mood is stable, no new  changes  All other systems were reviewed with the patient and are negative.  PHYSICAL EXAMINATION: ECOG PERFORMANCE STATUS:1  Vitals:   09/30/17 1016  BP: 131/89  Pulse: 61  Resp: 18  Temp: 98.4 F (36.9 C)  SpO2: 95%   Filed Weights   09/30/17 1016  Weight: 127 lb 3.2 oz (57.7 kg)    GENERAL:alert, no distress and comfortable, she uses a walker  SKIN: skin color, texture, turgor are normal, no rashes or significant lesions EYES: normal, conjunctiva are pink and non-injected, sclera clear OROPHARYNX:no exudate, no erythema and lips, buccal mucosa, and tongue normal  NECK: supple, thyroid normal size, non-tender, without nodularity LYMPH:  no palpable lymphadenopathy in the cervical, axillary or inguinal LUNGS: clear to auscultation and percussion with normal breathing effort HEART: regular rate & rhythm and no murmurs and no lower extremity edema ABDOMEN:abdomen soft, non-tender and normal bowel sounds Musculoskeletal:no cyanosis of digits and no clubbing.  PSYCH: alert & oriented x 3 with fluent speech NEURO: no focal motor/sensory deficits Breast: surgical scar in Left upper outer quadrant of breast has healed well. No palpable mass or adenopathy in either breast or axilla.   LABORATORY DATA:  CBC Latest Ref Rng & Units 09/30/2017 08/24/2017 08/17/2017  WBC 3.9 - 10.3 10e3/uL 4.1 6.9 2.9(L)  Hemoglobin 11.6 - 15.9 g/dL 12.7 9.2(L) 12.7  Hematocrit 34.8 - 46.6 % 37.2 27.6(L) 37.9  Platelets 145 - 400 10e3/uL 143(L) 103(L) 100(L)   CMP Latest Ref Rng & Units 09/30/2017 09/29/2017 08/24/2017  Glucose 70 - 140 mg/dl 86 - 128(H)  BUN 7.0 - 26.0 mg/dL 14.5 - 15  Creatinine 0.6 - 1.1 mg/dL 0.8 - 0.76  Sodium 136 - 145 mEq/L 143 - 137  Potassium 3.5 - 5.1 mEq/L 4.3 - 4.2  Chloride 101 - 111 mmol/L - - 105  CO2 22 - 29 mEq/L 25 - 22  Calcium 8.4 - 10.4 mg/dL 9.8 9.1 7.9(L)  Total Protein 6.4 - 8.3 g/dL 7.0 - -  Total Bilirubin 0.20 - 1.20 mg/dL 0.87 - -  Alkaline Phos 40 - 150  U/L 90 - -  AST 5 - 34 U/L 20 - -  ALT 0 - 55 U/L 15 - -    Diagnosis 02/23/2016 1. Breast, lumpectomy, left - INVASIVE DUCTAL CARCINOMA, GRADE I/III, SPANNING 1.0 CM. - INVASIVE CARCINOMA IS BROADLY LESS THAN 0.1 CM TO THE ANTERIOR/INFERIOR MARGIN OF SPECIMEN #1. - ATYPICAL DUCTAL HYPERPLASIA. - LOBULAR NEOPLASIA (ATYPICAL LOBULAR HYPERPLASIA) - ONE BENIGN LYMPH NODE (0/1). - SEE ONCOLOGY TABLE BELOW. 2. Breast, excision, left inferior - LOBULAR NEOPLASIA (LOBULAR CARCINOMA IN SITU). - SEE COMMENT. 1. 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 BREAST, INVASIVE TUMOR, WITH LYMPH NODES PRESENT Specimen, including laterality and lymph node sampling (sentinel, non-sentinel): Left breast. Procedure: Seed localized lumpectomy and additional inferior margin resection. Histologic type: Ductal. Grade: 1 Tubule formation: 1 Nuclear pleomorphism: 2 Mitotic:1 Tumor size (gross measurement): 1.0 cm Margins: Invasive, distance to closest margin: Broadly less than 0.1 cm to the anterior/inferior margin of specimen #1, see comment. Lymphovascular invasion: Not identified. Ductal carcinoma in situ: Not identified. Lobular neoplasia: Present, lobular carcinoma in situ. Tumor focality: Unifocal. Treatment effect: N/A Extent of tumor: Confined to breast parenchyma. Lymph nodes: Examined: 0 Sentinel,  1 Non-sentinel 1 Total Lymph nodes with metastasis: 0 Breast prognostic profile: Case [id number] Estrogen receptor: 100%, strong staining intensity Progesterone receptor: 100%, strong staining intensity Her 2 neu: No amplification was detected. The ratio was 1.35. Her 2 neu by FISH will be repeated on the current case (block 1A) and the results reported separately. Ki-67: 5% Non-neoplastic breast: Fibrocystic changes with calcifications and healing biopsy site TNM: pT1b, pN0 Comments: Immunohistochemical stains for smooth muscle myosin, calponin, and p63 support the  above diagnosis. Although the invasive carcinoma is broadly less than 0.1 cm to the anterior/inferior margin of specimen #1, the separately submitted inferior margin (specimen #2) is negative. 2. The surgical resection margin(s) of the specimen were inked and microscopically evaluated. (JBK:ecj 02/27/2016) Enid Cutter MD Pathologist, Electronic Signature (Case signed 02/27/2016)  Results: HER2 - NEGATIVE RATIO OF HER2/CEP17 SIGNALS 1.54 AVERAGE HER2 COPY NUMBER PER CELL 2.00 RADIOGRAPHIC STUDIES: I have personally reviewed the radiological images as listed and agreed with the findings in the report.   Bone Density scan 07/15/17 ASSESSMENT: The BMD measured at Femur Neck Left is 0.581 g/cm2 with a T-score of -3.3. This patient is considered osteoporotic according to [facility]) criteria. Lumbar spine was not utilized due to surgical hardware. There has been a statistically significant decrease in BMD of the left hip since prior exam dated 11/11/2008. Patient does not meet criteria for FRAX assessment. Site Region Measured Date Measured Age [age]A BMD Significant CHANGE T-score DualFemur Neck Left 07/15/2017 78.5 -3.3 0.581 g/cm2 * Left Forearm Radius 33% 07/15/2017 78.5 -1.1 0.704 g/cm2   Bilateral diagnostic mammogram 02/03/17 IMPRESSION: Expected surgical changes in the upper-outer quadrant of the left breast. No mammographic evidence of malignancy in the bilateral breasts.  Mm Diag Breast Tomo Uni Left and [place] 01/23/2016  01/23/2016  CLINICAL DATA:  76 year old female presenting for screening recall of left breast distortion. EXAM: DIGITAL DIAGNOSTIC LEFT MAMMOGRAM WITH 3D TOMOSYNTHESIS AND CAD LEFT BREAST ULTRASOUND COMPARISON:  Previous exam(s). ACR Breast Density Category c: The breast tissue is heterogeneously dense, which may obscure small masses. FINDINGS: There is a persistent distortion in the upper-outer quadrant of the left breast in the posterior depth  with a possible tiny associated mass. Mammographic images were processed with CAD. Physical exam of the upper-outer quadrant of the left breast demonstrates a small firm palpable lump at 1 o'clock, 4 cm from the nipple. Ultrasound targeted to the left breast at 1 o'clock, 4 cm from the nipple at the site of the palpable lump, there is a tiny 3-4 mm shadowing mass with possible internal blood flow on color Doppler imaging. Ultrasound of the left axilla demonstrates multiple normal-appearing lymph nodes. IMPRESSION: 1. A 3-4 mm shadowing mass is identified at 1 o'clock in the left breast, likely corresponding with the area of distortion on the mammogram. 2.  No evidence of left axillary lymphadenopathy. RECOMMENDATION: Ultrasound-guided biopsy is recommended for the shadowing mass in the left breast at 1 o'clock. This has been scheduled for 01/28/2016 at 2 p.m. I have discussed the findings and recommendations with the patient. Results were also provided in writing at the conclusion of the visit. If applicable, a reminder letter will be sent to the patient regarding the next appointment. BI-RADS CATEGORY  4: Suspicious. Electronically Signed   By: Ammie Ferrier M.D.   On: 01/23/2016 11:40   Mm Screening Breast Tomo Bilateral  01/14/2016  CLINICAL DATA:  Screening. EXAM: DIGITAL SCREENING BILATERAL MAMMOGRAM WITH 3D TOMO WITH CAD COMPARISON:  Previous exam(s). ACR Breast Density Category c: The breast tissue is heterogeneously dense, which may obscure small masses. FINDINGS: In the left breast, possible mass with distortion warrants further evaluation. In the right breast, no findings suspicious for malignancy. Images were processed with CAD. IMPRESSION: Further evaluation is suggested for possible mass with distortion in the left breast. RECOMMENDATION: Diagnostic mammogram and possibly ultrasound of the left breast. (Code:[id number]-L-89M) The patient will be contacted regarding the findings, and additional imaging  will be scheduled. BI-RADS CATEGORY  0: Incomplete. Need additional imaging evaluation and/or prior mammograms for comparison. Electronically Signed   By: Lajean Manes M.D.   On: 01/14/2016 11:36     ASSESSMENT & PLAN:  76 y.o. postmenopausal Caucasian woman, presented with screening this covered left breast cancer.  1. Breast cancer of upper-outer quadrant of left female breast, G1 invasive ductal carcinoma, pT1bN0M0, stage IA, ER+/PR+/HER2- -I previously reviewed her surgical pathology findings with patient in details. -She has stage I a disease, low grade, likely will do very well with stroke risk of recurrence. -We previously discussed the risk of cancer recurrence after complete surgical resection. Giving the very small size of the tumor, low-grade, low Ki-67, ER PR positive HER-2 negative disease, the risk of recurrence is quite low. I do not recommend adjuvant chemotherapy. -She has completed adjuvant breast radiation. -He is now on adjuvant endocrine therapy with anastrozole, is tolerating very well.  -Screening mammogram on 02/03/17 showed no evidence of malignancy. -due to her worsening bone density, history of spinal fracture, I recommend she switch from anastrozole to Tamoxifen. I discuss the side effects, benefit and risk of each agent for her bone health and her breast cancer. She agreed to switch to Tamoxifen.  Risk of thrombosis and endometrial cancer were discussed with patient, I encouraged her to remain to be physically active, she is on Xarelto for atrial fibrillation, risk of thrombosis is much lower. -She is clinically doing very well, asymptomatic, lab reviewed, physical exam was unremarkable today. No clinical recurrence. -We will continue breast cancer surveillance, including annual screening mammogram, self exam, routine follow-up with exam by [place]. -F/u in 3 months and repeat mammogram in 01/2018  2. Osteoporosis  -  She  had spinal fracture after a mild fall in 2018 -Her  PCP Dr. Brigitte Pulse has recommended her to start prolia injection, she is tolerating well  -07/15/17 bone density shows osteoporosis with the lowest T-Score in the left femur neck, -3.3, which has definitely gotten worse since 2014, when her T score was -2.2 -She will switch to Tamoxifen as of 09/30/17 and will continue prolia injections with Dr. Brigitte Pulse   3. HTN, AF, mitral regurgitation  -She'll continue follow-up with her primary care physician and cardiologist.  4. Genetics -Given her positive family history of breast cancer in her mother and a sister, we referred her to genetic counseling to rule out inheritable breast cancer syndrome.  -She was seen by our genetic counselor, and the genetic testing was negative.  5. Spinal fracture  -she will follow up with her orthopedic surgeon -I strongly encouraged her to follow up bone density scan every 2 years, to monitor her bone density  -Has surgery on her back on 08/23/17 to remove screws and uses back brace currently.     Plan -Stop Anastrozole -Prescribed tamoxifen today -Continue Prolia injection with Dr. Brigitte Pulse -F/u in 3 months with lab -Mammogram in 01/2018    All questions were answered. The patient knows to call the clinic with any problems, questions or concerns.  I spent 20 minutes counseling the patient face to face. The total time spent in the appointment was 25 minutes and more than 50% was on counseling.   This document serves as a record of services personally performed by Truitt Merle, MD. It was created on her behalf by Joslyn Devon, a trained medical scribe. The creation of this record is based on the scribe's personal observations and the provider's statements to them.    I have reviewed the above documentation for accuracy and completeness, and I agree with the above.      Truitt Merle, MD 09/30/2017

## 2017-09-30 ENCOUNTER — Other Ambulatory Visit (HOSPITAL_BASED_OUTPATIENT_CLINIC_OR_DEPARTMENT_OTHER): Payer: Medicare Other

## 2017-09-30 ENCOUNTER — Encounter: Payer: Self-pay | Admitting: Hematology

## 2017-09-30 ENCOUNTER — Ambulatory Visit: Payer: Medicare Other | Admitting: Hematology

## 2017-09-30 ENCOUNTER — Telehealth: Payer: Self-pay | Admitting: Hematology

## 2017-09-30 ENCOUNTER — Other Ambulatory Visit: Payer: Self-pay | Admitting: *Deleted

## 2017-09-30 VITALS — BP 131/89 | HR 61 | Temp 98.4°F | Resp 18 | Ht 64.0 in | Wt 127.2 lb

## 2017-09-30 DIAGNOSIS — Z170 Estrogen receptor positive status [ER+]: Secondary | ICD-10-CM

## 2017-09-30 DIAGNOSIS — N951 Menopausal and female climacteric states: Secondary | ICD-10-CM | POA: Diagnosis not present

## 2017-09-30 DIAGNOSIS — C50412 Malignant neoplasm of upper-outer quadrant of left female breast: Secondary | ICD-10-CM

## 2017-09-30 DIAGNOSIS — M810 Age-related osteoporosis without current pathological fracture: Secondary | ICD-10-CM | POA: Diagnosis not present

## 2017-09-30 DIAGNOSIS — I4891 Unspecified atrial fibrillation: Secondary | ICD-10-CM | POA: Diagnosis not present

## 2017-09-30 DIAGNOSIS — I10 Essential (primary) hypertension: Secondary | ICD-10-CM | POA: Diagnosis not present

## 2017-09-30 LAB — CBC WITH DIFFERENTIAL/PLATELET
BASO%: 0.9 % (ref 0.0–2.0)
BASOS ABS: 0 10*3/uL (ref 0.0–0.1)
EOS ABS: 0.1 10*3/uL (ref 0.0–0.5)
EOS%: 1.3 % (ref 0.0–7.0)
HCT: 37.2 % (ref 34.8–46.6)
HGB: 12.7 g/dL (ref 11.6–15.9)
LYMPH%: 14.9 % (ref 14.0–49.7)
MCH: 36.5 pg — AB (ref 25.1–34.0)
MCHC: 34.1 g/dL (ref 31.5–36.0)
MCV: 107 fL — AB (ref 79.5–101.0)
MONO#: 0.4 10*3/uL (ref 0.1–0.9)
MONO%: 9.4 % (ref 0.0–14.0)
NEUT#: 3 10*3/uL (ref 1.5–6.5)
NEUT%: 73.5 % (ref 38.4–76.8)
PLATELETS: 143 10*3/uL — AB (ref 145–400)
RBC: 3.47 10*6/uL — AB (ref 3.70–5.45)
RDW: 13.6 % (ref 11.2–14.5)
WBC: 4.1 10*3/uL (ref 3.9–10.3)
lymph#: 0.6 10*3/uL — ABNORMAL LOW (ref 0.9–3.3)

## 2017-09-30 LAB — COMPREHENSIVE METABOLIC PANEL
ALT: 15 U/L (ref 0–55)
AST: 20 U/L (ref 5–34)
Albumin: 3.7 g/dL (ref 3.5–5.0)
Alkaline Phosphatase: 90 U/L (ref 40–150)
Anion Gap: 9 mEq/L (ref 3–11)
BUN: 14.5 mg/dL (ref 7.0–26.0)
CALCIUM: 9.8 mg/dL (ref 8.4–10.4)
CHLORIDE: 109 meq/L (ref 98–109)
CO2: 25 meq/L (ref 22–29)
CREATININE: 0.8 mg/dL (ref 0.6–1.1)
EGFR: >60 mL/min/{1.73_m2} (ref >60)
GLUCOSE: 86 mg/dL (ref 70–140)
POTASSIUM: 4.3 meq/L (ref 3.5–5.1)
SODIUM: 143 meq/L (ref 136–145)
Total Bilirubin: 0.87 mg/dL (ref 0.20–1.20)
Total Protein: 7 g/dL (ref 6.4–8.3)

## 2017-09-30 MED ORDER — ANASTROZOLE 1 MG PO TABS: 1.0000 mg | ORAL_TABLET | Freq: Every day | ORAL | 3 refills | Status: DC

## 2017-09-30 MED ORDER — TAMOXIFEN CITRATE 20 MG PO TABS: 20.0000 mg | ORAL_TABLET | Freq: Every day | ORAL | 3 refills | Status: DC

## 2017-09-30 MED ORDER — CALCITRIOL 0.25 MCG PO CAPS: 0.2500 ug | ORAL_CAPSULE | Freq: Every day | ORAL | 11 refills | Status: DC

## 2017-09-30 NOTE — Telephone Encounter (Signed)
Scheduled appt per 11/9 los - Gave patient AVS and calender per los. Lab and f/u in 3 months.

## 2017-10-03 LAB — PROTEIN ELECTROPHORESIS, SERUM
ALPHA 2: 0.9 g/dL (ref 0.5–0.9)
Albumin ELP: 3.9 g/dL (ref 3.8–4.8)
Alpha 1: 0.4 g/dL — ABNORMAL HIGH (ref 0.2–0.3)
BETA 2: 0.5 g/dL (ref 0.2–0.5)
BETA GLOBULIN: 0.5 g/dL (ref 0.4–0.6)
GAMMA GLOBULIN: 1.1 g/dL (ref 0.8–1.7)
TOTAL PROTEIN: 7.2 g/dL (ref 6.1–8.1)

## 2017-10-03 LAB — PTH, INTACT AND CALCIUM
Calcium: 9.1 mg/dL (ref 8.6–10.4)
PTH: 160 pg/mL — AB (ref 14–64)

## 2017-10-24 ENCOUNTER — Other Ambulatory Visit: Payer: Self-pay | Admitting: Family Medicine

## 2017-10-24 DIAGNOSIS — R1903 Right lower quadrant abdominal swelling, mass and lump: Secondary | ICD-10-CM

## 2017-10-27 ENCOUNTER — Ambulatory Visit
Admission: RE | Admit: 2017-10-27 | Discharge: 2017-10-27 | Disposition: A | Discharge status: Home / Self Care | Payer: Medicare Other | Source: Ambulatory Visit | Attending: Family Medicine | Admitting: Family Medicine

## 2017-10-27 DIAGNOSIS — R1903 Right lower quadrant abdominal swelling, mass and lump: Secondary | ICD-10-CM

## 2017-10-27 MED ORDER — IOPAMIDOL (ISOVUE-300) INJECTION 61%
100.0000 mL | Freq: Once | INTRAVENOUS | Status: AC | PRN
  Administered 2017-10-27: 100 mL via INTRAVENOUS

## 2017-11-01 ENCOUNTER — Other Ambulatory Visit: Payer: Self-pay | Admitting: Physician Assistant

## 2017-11-01 DIAGNOSIS — I4819 Other persistent atrial fibrillation: Secondary | ICD-10-CM

## 2017-11-08 ENCOUNTER — Telehealth: Payer: Self-pay | Admitting: Interventional Cardiology

## 2017-11-08 ENCOUNTER — Ambulatory Visit: Payer: Medicare Other | Admitting: Endocrinology

## 2017-11-08 ENCOUNTER — Encounter: Payer: Self-pay | Admitting: Endocrinology

## 2017-11-08 VITALS — BP 90/62 | HR 83 | Wt 128.8 lb

## 2017-11-08 DIAGNOSIS — M810 Age-related osteoporosis without current pathological fracture: Secondary | ICD-10-CM

## 2017-11-08 MED ORDER — ALENDRONATE SODIUM 70 MG PO TABS: 70.0000 mg | ORAL_TABLET | ORAL | 3 refills | Status: DC

## 2017-11-08 NOTE — Telephone Encounter (Signed)
There is a higher risk of GI bleeding when using NSAIDs with Xarelto. Would advise pt to limit use of Celebrex as much as possible. Would recommend Tylenol for pain instead.

## 2017-11-08 NOTE — Telephone Encounter (Signed)
°  New Prob  Calling to confirm provider is aware of possible drug nteraction between XARELTO 20 MG TABS and celecoxib (CELEBREX) 200 MG capsule. Please call.

## 2017-11-08 NOTE — Progress Notes (Signed)
Subjective:    Patient ID: Michele Green, female    DOB: Nov 20, 1941, 76 y.o.   MRN: [id number]  HPI Pt returns for f/u of osteoporosis (dx'ed 2002; no secondary cause was found; she was noted to have L-3 comp fx in 2017, after a fall; she fx several more lumbar vertebrae in mid-2018, due to another fall; she takes low-dose ca++ and vit-D; she took ERT for approx 5 years; she took fosamax 2002-2008, but not since; she took short course of high-dose ergocalciferol in 2017 and 2018; she got 1st dose of prolia in 2018 (Dr Brigitte Pulse gives); DEXA in 2018 showed Femur Neck Left T-score of -3.3).  She finished high-dose vit-D.  She [place] still on prolia.  Back pain is unchanged.   Past Medical History:  Diagnosis Date  . Breast cancer ([place])   . Breast cancer of upper-outer quadrant of left female breast ([place]) 02/02/2016  . Colitis   . Colon polyp 02/2012  . Dupuytren contracture    bilateral hands  . Dysrhythmia   . Family history of breast cancer   . Hypertension   . On continuous oral anticoagulation 08/22/2015   Started on Xarelto 08/12/2015   . Osteopenia   . Persistent atrial fibrillation ([place]) 08/22/2015   Started late August or early September.   . Radiation 04/21/16-05/19/16   left breast 47.72 Gy, boosted to 10 Gy  . Wears glasses     Past Surgical History:  Procedure Laterality Date  . BREAST LUMPECTOMY WITH RADIOACTIVE SEED LOCALIZATION Left 02/23/2016   Procedure: BREAST LUMPECTOMY WITH RADIOACTIVE SEED LOCALIZATION;  Surgeon: [name], MD;  Location: [place];  Service: General;  Laterality: Left;  . CARDIOVERSION N/A 10/02/2015   Procedure: CARDIOVERSION;  Surgeon: Jerline Pain, MD;  Location: [facility] ENDOSCOPY;  Service: Cardiovascular;  Laterality: N/A;  . COLONOSCOPY    . DUPUYTREN CONTRACTURE RELEASE  2001   leftx2  . [place]   right  . DUPUYTREN CONTRACTURE RELEASE Right 05/02/2014   Procedure: EXCISION DUPUYTRENS RIGHT  PALMAR/SMALL ;  Surgeon: Cammie Sickle, MD;  Location: [place];  Service: Orthopedics;  Laterality: Right;  . TONSILLECTOMY  age 5    Social History   Socioeconomic History  . Marital status: Married    Spouse name: Not on file  . Number of children: 1  . Years of education: Not on file  . Highest education level: Not on file  Social Needs  . Financial resource strain: Not on file  . Food insecurity - worry: Not on file  . Food insecurity - inability: Not on file  . Transportation needs - medical: Not on file  . Transportation needs - non-medical: Not on file  Occupational History  . Not on file  Tobacco Use  . Smoking status: Former Smoker    Packs/day: 1.00    Years: 20.00    Pack years: 20.00    Types: Cigarettes    Last attempt to quit: 11/22/1992    Years since quitting: 24.9  . Smokeless tobacco: Never Used  Substance and Sexual Activity  . Alcohol use: Yes    Alcohol/week: 1.8 oz    Types: 3 Standard drinks or equivalent per week    Comment: social  . Drug use: No  . Sexual activity: No    Partners: Male    Birth control/protection: Post-menopausal  Other Topics Concern  . Not on file  Social History Narrative  . Not  on file    Current Outpatient Medications on File Prior to Visit  Medication Sig Dispense Refill  . acetaminophen (TYLENOL) 325 MG tablet Take 650 mg by mouth every 6 (six) hours as needed for mild pain or headache.    . allopurinol (ZYLOPRIM) 100 MG tablet Take 200 mg by mouth daily.     . calcitRIOL (ROCALTROL) 0.25 MCG capsule Take 1 capsule (0.25 mcg total) daily by mouth. 30 capsule 11  . celecoxib (CELEBREX) 200 MG capsule Take 200 mg by mouth 2 (two) times daily as needed for mild pain.    . cyclobenzaprine (FLEXERIL) 5 MG tablet Take 5 mg by mouth 3 (three) times daily as needed for muscle spasms.    . digoxin (LANOXIN) 0.125 MG tablet TAKE 0.5 TABLETS (0.0625 MG TOTAL) BY MOUTH DAILY. 45 tablet 2  . LIALDA 1.2 G EC  tablet Take 4.8 g by mouth daily.     . metoprolol succinate (TOPROL-XL) 25 MG 24 hr tablet Take 1 tablet (25 mg total) by mouth daily. 90 tablet 3  . oxyCODONE-acetaminophen (PERCOCET) 7.5-325 MG tablet Take 1-2 tablets by mouth every 4 (four) hours as needed for severe pain. 60 tablet 0  . simvastatin (ZOCOR) 20 MG tablet Take 20 mg by mouth daily.     . tamoxifen (NOLVADEX) 20 MG tablet Take 1 tablet (20 mg total) daily by mouth. 30 tablet 3  . Vitamin D, Ergocalciferol, 2000 units CAPS Take 4,000 Units by mouth every Monday.    Alveda Reasons 20 MG TABS tablet TAKE 1 TABLET BY MOUTH  DAILY WITH SUPPER 90 tablet 3   No current facility-administered medications on file prior to visit.     No Known Allergies  Family History  Problem Relation Age of Onset  . Cirrhosis Mother   . Breast cancer Mother 89  . Heart failure Father   . Heart attack Father   . Breast cancer Sister 65  . Kidney Stones Sister        loss of kidney due to stones  . Osteoporosis Neg Hx     BP 90/62 (BP Location: Right Arm, Patient Position: Sitting, Cuff Size: Normal)   Pulse 83   Wt 128 lb 12.8 oz (58.4 kg)   SpO2 93%   BMI 22.11 kg/m   Review of Systems Denies falls and GERD.     Objective:   Physical Exam VITAL SIGNS:  See vs page.  GENERAL: no distress.  Gait: normal and steady.   Lab Results  Component Value Date   CREATININE 0.8 09/30/2017   BUN 14.5 09/30/2017   NA 143 09/30/2017   K 4.3 09/30/2017   CL 105 08/24/2017   CO2 25 09/30/2017       Assessment & Plan:  Osteoporosis: she needs increased rx   Patient Instructions  I have sent a prescription to your pharmacy, to add fosamax.   Please continue the same prolia, calcium, and vitamin-D.  Please come back for a follow-up appointment in 8 months.

## 2017-11-08 NOTE — Telephone Encounter (Signed)
Megan or Claiborne Billings- are these high risk to take together?

## 2017-11-08 NOTE — Telephone Encounter (Signed)
Spoke with pt and made her aware of information provided by Pharmacist.  Pt in agreement to try Tylenol for pain and use Celebrex minimally.

## 2017-11-08 NOTE — Patient Instructions (Signed)
I have sent a prescription to your pharmacy, to add fosamax.   Please continue the same prolia, calcium, and vitamin-D.  Please come back for a follow-up appointment in 8 months.

## 2017-11-08 NOTE — Telephone Encounter (Signed)
Spoke with [facility] and made them aware that pt has been educated on using Tylenol vs Celebrex.  Will route to [name] and Bonney Leitz, PA-C to make them aware.

## 2017-12-01 ENCOUNTER — Ambulatory Visit: Payer: Medicare Other | Admitting: Interventional Cardiology

## 2017-12-01 ENCOUNTER — Encounter: Payer: Self-pay | Admitting: Interventional Cardiology

## 2017-12-01 VITALS — BP 116/70 | HR 100 | Ht 64.0 in | Wt 129.8 lb

## 2017-12-01 DIAGNOSIS — I481 Persistent atrial fibrillation: Secondary | ICD-10-CM | POA: Diagnosis not present

## 2017-12-01 DIAGNOSIS — Z79899 Other long term (current) drug therapy: Secondary | ICD-10-CM | POA: Diagnosis not present

## 2017-12-01 DIAGNOSIS — I340 Nonrheumatic mitral (valve) insufficiency: Secondary | ICD-10-CM

## 2017-12-01 DIAGNOSIS — I10 Essential (primary) hypertension: Secondary | ICD-10-CM | POA: Diagnosis not present

## 2017-12-01 DIAGNOSIS — I4819 Other persistent atrial fibrillation: Secondary | ICD-10-CM

## 2017-12-01 MED ORDER — DIGOXIN 125 MCG PO TABS: 0.1250 mg | ORAL_TABLET | Freq: Every day | ORAL | 3 refills | Status: DC

## 2017-12-01 NOTE — Progress Notes (Signed)
Cardiology Office Note    Date:  12/01/2017   ID:  Michele Green, DOB 1941/08/03, MRN [id number]  PCP:  [name], MD  Cardiologist: Sinclair Grooms, MD   Chief Complaint  Patient presents with  . Atrial Fibrillation    History of Present Illness:  Michele Green is a 77 y.o. female with a history of persistent atrial fibrillation noted since October 2017, chronic Xarelto, mod MR, hypertension and chronic diastolic heart failure.  She subsequently refused DC cardioversion and rate control with metoprolol and Lanoxin was started.  The patient has no complaints.  She has a history of persistent atrial fibrillation previously with rhythm control on amiodarone.  When seen in June, noted to be in atrial fib and the decision was made to go with rate control.  Amiodarone was discontinued.  Subsequently Lanoxin was added.  48-hour Holter monitor done in 2017 demonstrated an average heart rate of 106 bpm.   Past Medical History:  Diagnosis Date  . Breast cancer ([place])   . Breast cancer of upper-outer quadrant of left female breast ([place]) 02/02/2016  . Colitis   . Colon polyp 02/2012  . Dupuytren contracture    bilateral hands  . Dysrhythmia   . Family history of breast cancer   . Hypertension   . On continuous oral anticoagulation 08/22/2015   Started on Xarelto 08/12/2015   . Osteopenia   . Persistent atrial fibrillation ([place]) 08/22/2015   Started late August or early September.   . Radiation 04/21/16-05/19/16   left breast 47.72 Gy, boosted to 10 Gy  . Wears glasses     Past Surgical History:  Procedure Laterality Date  . BREAST LUMPECTOMY WITH RADIOACTIVE SEED LOCALIZATION Left 02/23/2016   Procedure: BREAST LUMPECTOMY WITH RADIOACTIVE SEED LOCALIZATION;  Surgeon: [name], MD;  Location: [place];  Service: General;  Laterality: Left;  . CARDIOVERSION N/A 10/02/2015   Procedure: CARDIOVERSION;  Surgeon: Jerline Pain, MD;  [facility]  ENDOSCOPY;  Service: Cardiovascular;  Laterality: N/A;  . COLONOSCOPY    . DUPUYTREN CONTRACTURE RELEASE  2001   leftx2  . [place]   right  . DUPUYTREN CONTRACTURE RELEASE Right 05/02/2014   Procedure: EXCISION DUPUYTRENS RIGHT PALMAR/SMALL ;  Surgeon: Cammie Sickle, MD;  Location: [place];  Service: Orthopedics;  Laterality: Right;  . TONSILLECTOMY  age 54    Current Medications: Outpatient Medications Prior to Visit  Medication Sig Dispense Refill  . acetaminophen (TYLENOL) 325 MG tablet Take 650 mg by mouth every 6 (six) hours as needed for mild pain or headache.    . alendronate (FOSAMAX) 70 MG tablet Take 1 tablet (70 mg total) by mouth once a week. Take with a full glass of water on an empty stomach. 12 tablet 3  . allopurinol (ZYLOPRIM) 100 MG tablet Take 200 mg by mouth daily.     . calcitRIOL (ROCALTROL) 0.25 MCG capsule Take 1 capsule (0.25 mcg total) daily by mouth. 30 capsule 11  . celecoxib (CELEBREX) 200 MG capsule Take 200 mg by mouth 2 (two) times daily as needed for mild pain.    . cyclobenzaprine (FLEXERIL) 5 MG tablet Take 5 mg by mouth 3 (three) times daily as needed for muscle spasms.    . digoxin (LANOXIN) 0.125 MG tablet TAKE 0.5 TABLETS (0.0625 MG TOTAL) BY MOUTH DAILY. 45 tablet 2  . LIALDA 1.2 G EC tablet Take 4.8 g by mouth daily.     [name]  metoprolol succinate (TOPROL-XL) 25 MG 24 hr tablet Take 1 tablet (25 mg total) by mouth daily. 90 tablet 3  . oxyCODONE-acetaminophen (PERCOCET) 7.5-325 MG tablet Take 1-2 tablets by mouth every 4 (four) hours as needed for severe pain. 60 tablet 0  . simvastatin (ZOCOR) 20 MG tablet Take 20 mg by mouth daily.     . tamoxifen (NOLVADEX) 20 MG tablet Take 1 tablet (20 mg total) daily by mouth. 30 tablet 3  . Vitamin D, Ergocalciferol, 2000 units CAPS Take 4,000 Units by mouth every Monday.    Alveda Reasons 20 MG TABS tablet TAKE 1 TABLET BY MOUTH  DAILY WITH SUPPER 90 tablet 3   No  facility-administered medications prior to visit.      Allergies:   Patient has no known allergies.   Social History   Socioeconomic History  . Marital status: Married    Spouse name: None  . Number of children: 1  . Years of education: None  . Highest education level: None  Social Needs  . Financial resource strain: None  . Food insecurity - worry: None  . Food insecurity - inability: None  . Transportation needs - medical: None  . Transportation needs - non-medical: None  Occupational History  . None  Tobacco Use  . Smoking status: Former Smoker    Packs/day: 1.00    Years: 20.00    Pack years: 20.00    Types: Cigarettes    Last attempt to quit: 11/22/1992    Years since quitting: 25.0  . Smokeless tobacco: Never Used  Substance and Sexual Activity  . Alcohol use: Yes    Alcohol/week: 1.8 oz    Types: 3 Standard drinks or equivalent per week    Comment: social  . Drug use: No  . Sexual activity: No    Partners: Male    Birth control/protection: Post-menopausal  Other Topics Concern  . None  Social History Narrative  . None     Family History:  The patient's family history includes Breast cancer (age of onset: 22) in her sister; Breast cancer (age of onset: 74) in her mother; Cirrhosis in her mother; Heart attack in her father; Heart failure in her father; Kidney Stones in her sister.   ROS:   Please see the history of present illness.    No complaints.  She denies dyspnea and has no limitations in her daily activities. All other systems reviewed and are negative.   PHYSICAL EXAM:   VS:  BP 116/70   Pulse 100   Ht 5' 4"  (1.626 m)   Wt 129 lb 12.8 oz (58.9 kg)   BMI 22.28 kg/m    GEN: Well nourished, well developed, in no acute distress  HEENT: normal  Neck: no JVD, carotid bruits, or masses Cardiac: IIRR with 2/6 apical systolic murmur.  No gallop is heard.  No rub is heard.  A Respiratory:  clear to auscultation bilaterally, normal work of  breathing GI: soft, nontender, nondistended, + BS MS: no deformity or atrophy  Skin: warm and dry, no rash Neuro:  Alert and Oriented x 3, Strength and sensation are intact Psych: euthymic mood, full affect  Wt Readings from Last 3 Encounters:  12/01/17 129 lb 12.8 oz (58.9 kg)  11/08/17 128 lb 12.8 oz (58.4 kg)  09/30/17 127 lb 3.2 oz (57.7 kg)      Studies/Labs Reviewed:   EKG:  EKG atrial fibrillation present on EKG from June 2018 with slightly increased rate.  Recent  Labs: 09/29/2017: TSH 1.96 09/30/2017: ALT 15; BUN 14.5; Creatinine 0.8; HGB 12.7; Platelets 143; Potassium 4.3; Sodium 143   Lipid Panel No results found for: CHOL, TRIG, HDL, CHOLHDL, VLDL, LDLCALC, LDLDIRECT  Additional studies/ records that were reviewed today include:  No new data    ASSESSMENT:    1. Persistent atrial fibrillation ([place])   2. Essential hypertension   3. Mitral valve insufficiency, unspecified etiology   4. On amiodarone therapy      PLAN:  In order of problems listed above:  1. Poor rate control.  Increase digoxin 0.125 mg daily.  After 2 weeks, perform a 24-hour Holter monitor. 2. Adequate blood pressure control. 3. Audible on exam.  No further investigation at this time. 4. Amiodarone was discontinued in 2017.  Increase Lanoxin to 0.125 mg/day.  24-hour Holter monitor in 2 weeks.  Medication Adjustments/Labs and Tests Ordered: Current medicines are reviewed at length with the patient today.  Concerns regarding medicines are outlined above.  Medication changes, Labs and Tests ordered today are listed in the Patient Instructions below. There are no Patient Instructions on file for this visit.   Signed, Sinclair Grooms, MD  12/01/2017 2:05 PM    [facility] [address], [place], [place]  [address] Phone: [phone]; Fax: [phone]

## 2017-12-01 NOTE — Patient Instructions (Signed)
Medication Instructions:  1) INCREASE Digoxin to .0178m once daily  Labwork: None  Testing/Procedures: Your physician has recommended that you wear a 24 hour holter monitor 2-3 weeks after starting increased dose of Digoxin. Holter monitors are medical devices that record the heart's electrical activity. Doctors most often use these monitors to diagnose arrhythmias. Arrhythmias are problems with the speed or rhythm of the heartbeat. The monitor is a small, portable device. You can wear one while you do your normal daily activities. This is usually used to diagnose what is causing palpitations/syncope (passing out).    Follow-Up: Your physician wants you to follow-up in: 6 months with Dr. STamala Julian  You will receive a reminder letter in the mail two months in advance. If you don't receive a letter, please call our office to schedule the follow-up appointment.   Any Other Special Instructions Will Be Listed Below (If Applicable).     If you need a refill on your cardiac medications before your next appointment, please call your pharmacy.

## 2017-12-20 ENCOUNTER — Ambulatory Visit (INDEPENDENT_AMBULATORY_CARE_PROVIDER_SITE_OTHER): Payer: Medicare Other

## 2017-12-20 DIAGNOSIS — I4819 Other persistent atrial fibrillation: Secondary | ICD-10-CM

## 2017-12-20 DIAGNOSIS — I481 Persistent atrial fibrillation: Secondary | ICD-10-CM

## 2017-12-29 NOTE — Progress Notes (Signed)
[place]  [phone]) [phone] [phone]  Clinic Follow Up Note   Patient Care Team: [name], MD as PCP - General (Family Medicine) [name], MD as PCP - Cardiology (Cardiology) 01/02/2018   CHIEF COMPLAINTS:  Follow up  left breast cancer  Oncology History   Breast cancer of upper-outer quadrant of left female breast [facility])   Staging form: Breast, AJCC 7th Edition   - Clinical stage from 01/28/2016: Stage IA (T1a, N0, M0) - Signed by Truitt Merle, MD on 02/03/2016   - Pathologic stage from 02/23/2016: Stage IA (T1b, N0, cM0) - Signed by Truitt Merle, MD on 06/14/2016        Breast cancer of upper-outer quadrant of left female breast ([place])   01/14/2016 Mammogram    Screening mammogram showed possible mass with distortion in the left breast.      01/23/2016 Imaging    Diagnostic mammogram and ultrasound of the left breast showed a 3-4 mm shadowing mass at 1:00 in the left breast, no adenopathy.      01/28/2016 Initial Diagnosis    Breast cancer of upper-outer quadrant of left female breast ([place])      01/28/2016 Initial Biopsy    Left breast mass biopsy showed invasive ductal carcinoma, grade 1.      01/28/2016 Receptors her2    Your 100% positive, PR 100% positive, HER-2 negative, Ki-67 5%      02/23/2016 Surgery    Left breast lumpectomy, and sentinel lymph node biopsy      02/23/2016 Pathology Results    Left breast lumpectomy showed a 1.0 cm invasive ductal carcinoma, grade 1, no lymphvascular invasion, invasive carcinoma is broadly less than 0.1 cm to the anterior inferior margin, ADH, ALH, 1 node(-). Left inferior reexcision margin showed LCIS       04/21/2016 - 05/19/2016 Radiation Therapy    adjuvant breast radiation       06/10/2016 -  Anti-estrogen oral therapy    Anastrozole 1 mg daily, changed to  Tamoxifen due to osteoporosis on 09/30/17        02/03/2017 Mammogram    Bilateral diagnostic mammogram 02/03/17 IMPRESSION: Expected  surgical changes in the upper-outer quadrant of the left breast. No mammographic evidence of malignancy in the bilateral breasts.        HISTORY OF PRESENTING ILLNESS:  Michele Green 77 y.o. female is here because of newly diagnosed left breast cancer. She is accompanied by her husband to our multidisciplinary rest clinic today.  This was discovered by screening mammo, she denies any palpable breast mass, skin change or nipple discharge, she feels well, no complains. She remains to be physically active.   She  was found to have AF and had cardioverstion in 09/2015,  she has been on amiodarone and Xarelto since then. She also has had colitis 2-3 yeears, on liada,  she follows up with gastroenterologist Dr. Amedeo Plenty.  GYN HISTORY  Menarchal: 12 LMP: 48 Contraceptive: 8 HRT: 5 years, stopped when she was 42 G1P1: no breast feeding      CURRENTLY THERAPY: Anastrozole 21m daily, started in 05/2016, changed to Tamoxifen due to osteoporosis on 09/30/17  Prolia injections with Dr. sBrigitte Pulse INTERIM HISTORY:  Michele STUCKERTreturns for follow-up. She is doing well overall. Her back pain has improved, no other new pains. She has been on Tamoxifen since her last visit, tolerating well, with occasional hot flush, no other complains.    MEDICAL HISTORY:  Past Medical  History:  Diagnosis Date  . Breast cancer ([place])   . Breast cancer of upper-outer quadrant of left female breast ([place]) 02/02/2016  . Colitis   . Colon polyp 02/2012  . Dupuytren contracture    bilateral hands  . Dysrhythmia   . Family history of breast cancer   . Hypertension   . On continuous oral anticoagulation 08/22/2015   Started on Xarelto 08/12/2015   . Osteopenia   . Persistent atrial fibrillation ([place]) 08/22/2015   Started late August or early September.   . Radiation 04/21/16-05/19/16   left breast 47.72 Gy, boosted to 10 Gy  . Wears glasses     SURGICAL HISTORY: Past Surgical History:  Procedure Laterality Date    . BREAST LUMPECTOMY WITH RADIOACTIVE SEED LOCALIZATION Left 02/23/2016   Procedure: BREAST LUMPECTOMY WITH RADIOACTIVE SEED LOCALIZATION;  Surgeon: [name], MD;  Location: [place];  Service: General;  Laterality: Left;  . CARDIOVERSION N/A 10/02/2015   Procedure: CARDIOVERSION;  Surgeon: Jerline Pain, MD;  Location[facility] ENDOSCOPY;  Service: Cardiovascular;  Laterality: N/A;  . COLONOSCOPY    . DUPUYTREN CONTRACTURE RELEASE  2001   leftx2  . [place]   right  . DUPUYTREN CONTRACTURE RELEASE Right 05/02/2014   Procedure: EXCISION DUPUYTRENS RIGHT PALMAR/SMALL ;  Surgeon: Cammie Sickle, MD;  Location: [place];  Service: Orthopedics;  Laterality: Right;  . TONSILLECTOMY  age 31    SOCIAL HISTORY: Social History   Socioeconomic History  . Marital status: Married    Spouse name: Not on file  . Number of children: 1  . Years of education: Not on file  . Highest education level: Not on file  Social Needs  . Financial resource strain: Not on file  . Food insecurity - worry: Not on file  . Food insecurity - inability: Not on file  . Transportation needs - medical: Not on file  . Transportation needs - non-medical: Not on file  Occupational History  . Not on file  Tobacco Use  . Smoking status: Former Smoker    Packs/day: 1.00    Years: 20.00    Pack years: 20.00    Types: Cigarettes    Last attempt to quit: 11/22/1992    Years since quitting: 25.1  . Smokeless tobacco: Never Used  Substance and Sexual Activity  . Alcohol use: Yes    Alcohol/week: 1.8 oz    Types: 3 Standard drinks or equivalent per week    Comment: social  . Drug use: No  . Sexual activity: No    Partners: Male    Birth control/protection: Post-menopausal  Other Topics Concern  . Not on file  Social History Narrative  . Not on file    FAMILY HISTORY: Family History  Problem Relation Age of Onset  . Cirrhosis Mother   . Breast  cancer Mother 54  . Heart failure Father   . Heart attack Father   . Breast cancer Sister 58  . Kidney Stones Sister        loss of kidney due to stones  . Osteoporosis Neg Hx     ALLERGIES:  has No Known Allergies.  MEDICATIONS:  Current Outpatient Medications  Medication Sig Dispense Refill  . acetaminophen (TYLENOL) 325 MG tablet Take 650 mg by mouth every 6 (six) hours as needed for mild pain or headache.    . alendronate (FOSAMAX) 70 MG tablet Take 1 tablet (70 mg total) by mouth  once a week. Take with a full glass of water on an empty stomach. 12 tablet 3  . allopurinol (ZYLOPRIM) 100 MG tablet Take 200 mg by mouth daily.     . calcitRIOL (ROCALTROL) 0.25 MCG capsule Take 1 capsule (0.25 mcg total) daily by mouth. 30 capsule 11  . celecoxib (CELEBREX) 200 MG capsule Take 200 mg by mouth 2 (two) times daily as needed for mild pain.    [name] LIALDA 1.2 G EC tablet Take 4.8 g by mouth daily.     [name] oxyCODONE-acetaminophen (PERCOCET) 7.5-325 MG tablet Take 1-2 tablets by mouth every 4 (four) hours as needed for severe pain. 60 tablet 0  . simvastatin (ZOCOR) 20 MG tablet Take 20 mg by mouth daily.     . tamoxifen (NOLVADEX) 20 MG tablet Take 1 tablet (20 mg total) daily by mouth. 30 tablet 3  . Vitamin D, Ergocalciferol, 2000 units CAPS Take 4,000 Units by mouth every Monday.    Alveda Reasons 20 MG TABS tablet TAKE 1 TABLET BY MOUTH  DAILY WITH SUPPER 90 tablet 3  . metoprolol succinate (TOPROL-XL) 50 MG 24 hr tablet Take 1 tablet (50 mg total) by mouth daily. Take with or immediately following a meal. 90 tablet 3   No current facility-administered medications for this visit.     REVIEW OF SYSTEMS: Constitutional: Denies fevers, chills or abnormal night sweats Eyes: Denies blurriness of vision, double vision or watery eyes Ears, nose, mouth, throat, and face: Denies mucositis or sore throat Respiratory: Denies cough, dyspnea or wheezes Cardiovascular: Denies palpitation, chest discomfort  or lower extremity swelling Gastrointestinal:  Denies nausea, heartburn or change in bowel habits Skin: Denies abnormal skin rashes Lymphatics: Denies new lymphadenopathy or easy bruising Neurological:Denies numbness, tingling or new weaknesses Musculoskeletal: negative  Behavioral/Psych: Mood is stable, no new changes  All other systems were reviewed with the patient and are negative.  PHYSICAL EXAMINATION: ECOG PERFORMANCE STATUS:1  Vitals:   01/02/18 1351  BP: (!) 148/90  Pulse: (!) 145  Resp: 20  Temp: (!) 97.5 F (36.4 C)  SpO2: 96%   Filed Weights   01/02/18 1351  Weight: 130 lb 4.8 oz (59.1 kg)    GENERAL:alert, no distress and comfortable, she uses a walker  SKIN: skin color, texture, turgor are normal, no rashes or significant lesions EYES: normal, conjunctiva are pink and non-injected, sclera clear OROPHARYNX:no exudate, no erythema and lips, buccal mucosa, and tongue normal  NECK: supple, thyroid normal size, non-tender, without nodularity LYMPH:  no palpable lymphadenopathy in the cervical, axillary or inguinal LUNGS: clear to auscultation and percussion with normal breathing effort HEART: regular rate & rhythm and no murmurs and no lower extremity edema ABDOMEN:abdomen soft, non-tender and normal bowel sounds Musculoskeletal:no cyanosis of digits and no clubbing.  PSYCH: alert & oriented x 3 with fluent speech NEURO: no focal motor/sensory deficits Breast: surgical scar in Left upper outer quadrant of breast has healed well. No palpable mass or adenopathy in either breast or axilla.   LABORATORY DATA:  CBC Latest Ref Rng & Units 01/02/2018 09/30/2017 08/24/2017  WBC 3.9 - 10.3 K/uL 4.5 4.1 6.9  Hemoglobin 11.6 - 15.9 g/dL 12.6 12.7 9.2(L)  Hematocrit 34.8 - 46.6 % 37.1 37.2 27.6(L)  Platelets 145 - 400 K/uL 130(L) 143(L) 103(L)   CMP Latest Ref Rng & Units 01/02/2018 09/30/2017 09/29/2017  Glucose 70 - 140 mg/dL 96 86 -  BUN 7 - 26 mg/dL 14 14.5 -  Creatinine  0.60 - 1.10  mg/dL 0.82 0.8 -  Sodium 136 - 145 mmol/L 141 143 -  Potassium 3.5 - 5.1 mmol/L 4.5 4.3 -  Chloride 98 - 109 mmol/L 106 - -  CO2 22 - 29 mmol/L 26 25 -  Calcium 8.4 - 10.4 mg/dL 9.5 9.8 9.1  Total Protein 6.4 - 8.3 g/dL 6.9 7.0 7.2  Total Bilirubin 0.2 - 1.2 mg/dL 0.9 0.87 -  Alkaline Phos 40 - 150 U/L 68 90 -  AST 5 - 34 U/L 23 20 -  ALT 0 - 55 U/L 16 15 -    Diagnosis 02/23/2016 1. Breast, lumpectomy, left - INVASIVE DUCTAL CARCINOMA, GRADE I/III, SPANNING 1.0 CM. - INVASIVE CARCINOMA IS BROADLY LESS THAN 0.1 CM TO THE ANTERIOR/INFERIOR MARGIN OF SPECIMEN #1. - ATYPICAL DUCTAL HYPERPLASIA. - LOBULAR NEOPLASIA (ATYPICAL LOBULAR HYPERPLASIA) - ONE BENIGN LYMPH NODE (0/1). - SEE ONCOLOGY TABLE BELOW. 2. Breast, excision, left inferior - LOBULAR NEOPLASIA (LOBULAR CARCINOMA IN SITU). - SEE COMMENT. 1. 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 BREAST, INVASIVE TUMOR, WITH LYMPH NODES PRESENT Specimen, including laterality and lymph node sampling (sentinel, non-sentinel): Left breast. Procedure: Seed localized lumpectomy and additional inferior margin resection. Histologic type: Ductal. Grade: 1 Tubule formation: 1 Nuclear pleomorphism: 2 Mitotic:1 Tumor size (gross measurement): 1.0 cm Margins: Invasive, distance to closest margin: Broadly less than 0.1 cm to the anterior/inferior margin of specimen #1, see comment. Lymphovascular invasion: Not identified. Ductal carcinoma in situ: Not identified. Lobular neoplasia: Present, lobular carcinoma in situ. Tumor focality: Unifocal. Treatment effect: N/A Extent of tumor: Confined to breast parenchyma. Lymph nodes: Examined: 0 Sentinel,  1 Non-sentinel 1 Total Lymph nodes with metastasis: 0 Breast prognostic profile: Case [id number] Estrogen receptor: 100%, strong staining intensity Progesterone receptor: 100%, strong staining intensity Her 2 neu: No amplification was detected. The ratio was  1.35. Her 2 neu by FISH will be repeated on the current case (block 1A) and the results reported separately. Ki-67: 5% Non-neoplastic breast: Fibrocystic changes with calcifications and healing biopsy site TNM: pT1b, pN0 Comments: Immunohistochemical stains for smooth muscle myosin, calponin, and p63 support the above diagnosis. Although the invasive carcinoma is broadly less than 0.1 cm to the anterior/inferior margin of specimen #1, the separately submitted inferior margin (specimen #2) is negative. 2. The surgical resection margin(s) of the specimen were inked and microscopically evaluated. (JBK:ecj 02/27/2016) Enid Cutter MD Pathologist, Electronic Signature (Case signed 02/27/2016) Results: HER2 - NEGATIVE RATIO OF HER2/CEP17 SIGNALS 1.54 AVERAGE HER2 COPY NUMBER PER CELL 2.00  RADIOGRAPHIC STUDIES: I have personally reviewed the radiological images as listed and agreed with the findings in the report.   Bone Density scan 07/15/17 ASSESSMENT: The BMD measured at Femur Neck Left is 0.581 g/cm2 with a T-score of -3.3. This patient is considered osteoporotic according to [facility]) criteria. Lumbar spine was not utilized due to surgical hardware. There has been a statistically significant decrease in BMD of the left hip since prior exam dated 11/11/2008. Patient does not meet criteria for FRAX assessment. Site Region Measured Date Measured Age [age]A BMD Significant CHANGE T-score DualFemur Neck Left 07/15/2017 78.5 -3.1 0.581 g/cm2 * Left Forearm Radius 33% 07/15/2017 78.5 [age] 0.704 g/cm2   Bilateral diagnostic mammogram 02/03/17 IMPRESSION: Expected surgical changes in the upper-outer quadrant of the left breast. No mammographic evidence of malignancy in the bilateral breasts.  Mm Diag Breast Tomo Uni Left and [place] 01/23/2016  01/23/2016  CLINICAL DATA:  77 year old female presenting for screening recall of left breast distortion. EXAM: DIGITAL  DIAGNOSTIC  LEFT MAMMOGRAM WITH 3D TOMOSYNTHESIS AND CAD LEFT BREAST ULTRASOUND COMPARISON:  Previous exam(s). ACR Breast Density Category c: The breast tissue is heterogeneously dense, which may obscure small masses. FINDINGS: There is a persistent distortion in the upper-outer quadrant of the left breast in the posterior depth with a possible tiny associated mass. Mammographic images were processed with CAD. Physical exam of the upper-outer quadrant of the left breast demonstrates a small firm palpable lump at 1 o'clock, 4 cm from the nipple. Ultrasound targeted to the left breast at 1 o'clock, 4 cm from the nipple at the site of the palpable lump, there is a tiny 3-4 mm shadowing mass with possible internal blood flow on color Doppler imaging. Ultrasound of the left axilla demonstrates multiple normal-appearing lymph nodes. IMPRESSION: 1. A 3-4 mm shadowing mass is identified at 1 o'clock in the left breast, likely corresponding with the area of distortion on the mammogram. 2.  No evidence of left axillary lymphadenopathy. RECOMMENDATION: Ultrasound-guided biopsy is recommended for the shadowing mass in the left breast at 1 o'clock. This has been scheduled for 01/28/2016 at 2 p.m. I have discussed the findings and recommendations with the patient. Results were also provided in writing at the conclusion of the visit. If applicable, a reminder letter will be sent to the patient regarding the next appointment. BI-RADS CATEGORY  4: Suspicious. Electronically Signed   By: Ammie Ferrier M.D.   On: 01/23/2016 11:40   Mm Screening Breast Tomo Bilateral  01/14/2016  CLINICAL DATA:  Screening. EXAM: DIGITAL SCREENING BILATERAL MAMMOGRAM WITH 3D TOMO WITH CAD COMPARISON:  Previous exam(s). ACR Breast Density Category c: The breast tissue is heterogeneously dense, which may obscure small masses. FINDINGS: In the left breast, possible mass with distortion warrants further evaluation. In the right breast, no findings suspicious for  malignancy. Images were processed with CAD. IMPRESSION: Further evaluation is suggested for possible mass with distortion in the left breast. RECOMMENDATION: Diagnostic mammogram and possibly ultrasound of the left breast. (Code:[id number]) The patient will be contacted regarding the findings, and additional imaging will be scheduled. BI-RADS CATEGORY  0: Incomplete. Need additional imaging evaluation and/or prior mammograms for comparison. Electronically Signed   By: Lajean Manes M.D.   On: 01/14/2016 11:36     ASSESSMENT & PLAN:  77 y.o. postmenopausal Caucasian woman, presented with screening this covered left breast cancer.  1. Breast cancer of upper-outer quadrant of left female breast, G1 invasive ductal carcinoma, pT1bN0M0, stage IA, ER+/PR+/HER2- -I previously reviewed her surgical pathology findings with patient in details. -She has stage I a disease, low grade, likely will do very well with stroke risk of recurrence. -We previously discussed the risk of cancer recurrence after complete surgical resection. Giving the very small size of the tumor, low-grade, low Ki-67, ER PR positive HER-2 negative disease, the risk of recurrence is quite low. I do not recommend adjuvant chemotherapy. -She has completed adjuvant breast radiation. -He is now on adjuvant endocrine therapy with anastrozole, is tolerating very well.  -Screening mammogram on 02/03/17 showed no evidence of malignancy. -due to her worsening bone density, history of spinal fracture, I have switched her anastrozole to tamoxifen.  She tolerating tamoxifen well, will continue. -She is clinically doing very well, asymptomatic, lab reviewed, physical exam was unremarkable today. No clinical recurrence. -We will continue breast cancer surveillance, including annual screening mammogram, self exam, routine follow-up with exam by [place]. - repeat mammogram in 01/2018 -Follow-up in 6 months  2. Osteoporosis  -She  had spinal fracture after a  mild fall in 2018 -Her PCP Dr. Brigitte Pulse has recommended her to start prolia injection, she is tolerating well  -07/15/17 bone density shows osteoporosis with the lowest T-Score in the left femur neck, -3.3, which has definitely gotten worse since 2014, when her T score was -2.2 -She will switch to Tamoxifen as of 09/30/17 and will continue prolia injections with Dr. Brigitte Pulse   3. HTN, AF, mitral regurgitation  -She'll continue follow-up with her primary care physician and cardiologist.  4. Genetics -Given her positive family history of breast cancer in her mother and a sister, we referred her to genetic counseling to rule out inheritable breast cancer syndrome.  -She was seen by our genetic counselor, and the genetic testing was negative.  5. Spinal fracture  -she will follow up with her orthopedic surgeon -I strongly encouraged her to follow up bone density scan every 2 years, to monitor her bone density  -Has surgery on her back on 08/23/17 to remove screws, she has recovered well     Plan -continue tamoxifen  -Continue Prolia injection with Dr. Brigitte Pulse -F/u in 6 months with lab -Mammogram in 01/2018   All questions were answered. The patient knows to call the clinic with any problems, questions or concerns.  I spent 20 minutes counseling the patient face to face. The total time spent in the appointment was 25 minutes and more than 50% was on counseling.    Truitt Merle, MD 01/02/2018

## 2017-12-30 ENCOUNTER — Other Ambulatory Visit: Payer: Medicare Other

## 2017-12-30 ENCOUNTER — Ambulatory Visit: Payer: Medicare Other | Admitting: Hematology

## 2018-01-02 ENCOUNTER — Inpatient Hospital Stay: Payer: Medicare Other | Attending: Hematology

## 2018-01-02 ENCOUNTER — Telehealth: Payer: Self-pay | Admitting: Interventional Cardiology

## 2018-01-02 ENCOUNTER — Inpatient Hospital Stay (HOSPITAL_BASED_OUTPATIENT_CLINIC_OR_DEPARTMENT_OTHER): Payer: Medicare Other | Admitting: Hematology

## 2018-01-02 VITALS — BP 148/90 | HR 145 | Temp 97.5°F | Resp 20 | Ht 64.0 in | Wt 130.3 lb

## 2018-01-02 DIAGNOSIS — Z170 Estrogen receptor positive status [ER+]: Secondary | ICD-10-CM | POA: Insufficient documentation

## 2018-01-02 DIAGNOSIS — M810 Age-related osteoporosis without current pathological fracture: Secondary | ICD-10-CM | POA: Diagnosis not present

## 2018-01-02 DIAGNOSIS — Z803 Family history of malignant neoplasm of breast: Secondary | ICD-10-CM | POA: Insufficient documentation

## 2018-01-02 DIAGNOSIS — C50412 Malignant neoplasm of upper-outer quadrant of left female breast: Secondary | ICD-10-CM | POA: Insufficient documentation

## 2018-01-02 LAB — CBC WITH DIFFERENTIAL/PLATELET
BASOS PCT: 1 %
Basophils Absolute: 0 10*3/uL (ref 0.0–0.1)
EOS ABS: 0.1 10*3/uL (ref 0.0–0.5)
EOS PCT: 1 %
HCT: 37.1 % (ref 34.8–46.6)
Hemoglobin: 12.6 g/dL (ref 11.6–15.9)
Lymphocytes Relative: 18 %
Lymphs Abs: 0.8 10*3/uL — ABNORMAL LOW (ref 0.9–3.3)
MCH: 36.3 pg — ABNORMAL HIGH (ref 25.1–34.0)
MCHC: 33.9 g/dL (ref 31.5–36.0)
MCV: 107.1 fL — ABNORMAL HIGH (ref 79.5–101.0)
Monocytes Absolute: 0.4 10*3/uL (ref 0.1–0.9)
Monocytes Relative: 8 %
Neutro Abs: 3.2 10*3/uL (ref 1.5–6.5)
Neutrophils Relative %: 72 %
PLATELETS: 130 10*3/uL — AB (ref 145–400)
RBC: 3.46 MIL/uL — ABNORMAL LOW (ref 3.70–5.45)
RDW: 13.7 % (ref 11.2–14.5)
WBC: 4.5 10*3/uL (ref 3.9–10.3)

## 2018-01-02 LAB — COMPREHENSIVE METABOLIC PANEL
ALK PHOS: 68 U/L (ref 40–150)
ALT: 16 U/L (ref 0–55)
AST: 23 U/L (ref 5–34)
Albumin: 3.7 g/dL (ref 3.5–5.0)
Anion gap: 9 (ref 3–11)
BILIRUBIN TOTAL: 0.9 mg/dL (ref 0.2–1.2)
BUN: 14 mg/dL (ref 7–26)
CALCIUM: 9.5 mg/dL (ref 8.4–10.4)
CHLORIDE: 106 mmol/L (ref 98–109)
CO2: 26 mmol/L (ref 22–29)
CREATININE: 0.82 mg/dL (ref 0.60–1.10)
GFR calc Af Amer: >60 mL/min (ref >60)
Glucose, Bld: 96 mg/dL (ref 70–140)
Potassium: 4.5 mmol/L (ref 3.5–5.1)
Sodium: 141 mmol/L (ref 136–145)
TOTAL PROTEIN: 6.9 g/dL (ref 6.4–8.3)

## 2018-01-02 NOTE — Telephone Encounter (Signed)
Mrs. Rought is calling because she had a monitor placed on 12/20/2017 and have not heard anything about the results . Please call

## 2018-01-02 NOTE — Telephone Encounter (Signed)
Will route to [name] for review and advisement on results.

## 2018-01-03 ENCOUNTER — Telehealth: Payer: Self-pay | Admitting: Hematology

## 2018-01-03 MED ORDER — METOPROLOL SUCCINATE ER 50 MG PO TB24: 50.0000 mg | ORAL_TABLET | Freq: Every day | ORAL | 3 refills | Status: DC

## 2018-01-03 NOTE — Telephone Encounter (Signed)
Heart rate is better when compared to prior study several years ago but still too fast at rest.  The resting rate is 100 bpm.  Discontinue digoxin.  Increase metoprolol succinate 50 mg/day.  Start the higher dose of metoprolol 2 days after digoxin stopped.

## 2018-01-03 NOTE — Telephone Encounter (Signed)
Mailed patient calendar of upcoming August appointments.

## 2018-01-03 NOTE — Telephone Encounter (Signed)
Left message to call back  

## 2018-01-03 NOTE — Telephone Encounter (Signed)
Spoke with pt and went over recommendations per [name].  Pt verbalized understanding and was in agreement with this plan.

## 2018-01-08 ENCOUNTER — Encounter: Payer: Self-pay | Admitting: Hematology

## 2018-01-11 ENCOUNTER — Other Ambulatory Visit: Payer: Self-pay | Admitting: Physician Assistant

## 2018-01-11 ENCOUNTER — Other Ambulatory Visit: Payer: Self-pay | Admitting: Interventional Cardiology

## 2018-01-11 MED ORDER — METOPROLOL SUCCINATE ER 50 MG PO TB24: 50.0000 mg | ORAL_TABLET | Freq: Every day | ORAL | 3 refills | Status: DC

## 2018-01-28 ENCOUNTER — Other Ambulatory Visit: Payer: Self-pay | Admitting: Hematology

## 2018-02-20 ENCOUNTER — Ambulatory Visit
Admission: RE | Admit: 2018-02-20 | Discharge: 2018-02-20 | Disposition: A | Discharge status: Home / Self Care | Payer: Medicare Other | Source: Ambulatory Visit | Attending: Hematology | Admitting: Hematology

## 2018-02-20 DIAGNOSIS — C50412 Malignant neoplasm of upper-outer quadrant of left female breast: Secondary | ICD-10-CM

## 2018-02-20 DIAGNOSIS — Z170 Estrogen receptor positive status [ER+]: Principal | ICD-10-CM

## 2018-02-20 HISTORY — DX: Personal history of irradiation: Z92.3

## 2018-03-29 ENCOUNTER — Ambulatory Visit: Payer: Medicare Other | Admitting: Endocrinology

## 2018-03-29 ENCOUNTER — Encounter: Payer: Self-pay | Admitting: Endocrinology

## 2018-03-29 VITALS — BP 110/72 | HR 100 | Wt 126.6 lb

## 2018-03-29 DIAGNOSIS — M810 Age-related osteoporosis without current pathological fracture: Secondary | ICD-10-CM

## 2018-03-29 LAB — BASIC METABOLIC PANEL
BUN: 13 mg/dL (ref 6–23)
CHLORIDE: 107 meq/L (ref 96–112)
CO2: 24 mEq/L (ref 19–32)
Calcium: 9.1 mg/dL (ref 8.4–10.5)
Creatinine, Ser: 0.83 mg/dL (ref 0.40–1.20)
GFR: 70.43 mL/min (ref >60.00)
Glucose, Bld: 96 mg/dL (ref 70–99)
POTASSIUM: 4.3 meq/L (ref 3.5–5.1)
Sodium: 141 mEq/L (ref 135–145)

## 2018-03-29 LAB — VITAMIN D 25 HYDROXY (VIT D DEFICIENCY, FRACTURES): VITD: 36.48 ng/mL (ref 30.00–100.00)

## 2018-03-29 MED ORDER — VITAMIN D (ERGOCALCIFEROL) 50 MCG (2000 UT) PO CAPS: 4000.0000 [IU] | ORAL_CAPSULE | Freq: Every day | ORAL | 11 refills | Status: DC

## 2018-03-29 NOTE — Patient Instructions (Addendum)
blood tests are requested for you today.  We'll let you know about the results.   Let's recheck the bone density in August.   Please come back for a follow-up appointment in 6 months.

## 2018-03-29 NOTE — Progress Notes (Signed)
Subjective:    Patient ID: Michele Green, female    DOB: [birth year], 77 y.o.   MRN: [id number]  HPI Pt returns for f/u of osteoporosis: Dx'ed: 2002 Secondary cause: vit-D def.   Fractures: L-spine comp fxs in 2017 and 2018 (with falls).  She had surgical repair in 2018.   Past rx: fosamax 2002-2008.  Current rx: prolia since 2018 (Dr Brigitte Pulse gives), fosamax (since 2018), rocaltrol, and vit-D, 4000 units/day.   Last DEXA result: (2018) Femur Neck Left T-score was -3.3 Other: she took ERT for approx 5 years, in the 1990's; she takes tamoxifen for breast cancer (since 2017).  Interval hx: back pain is improved.   Past Medical History:  Diagnosis Date  . Breast cancer ([place])   . Breast cancer of upper-outer quadrant of left female breast ([place]) 02/02/2016  . Colitis   . Colon polyp 02/2012  . Dupuytren contracture    bilateral hands  . Dysrhythmia   . Family history of breast cancer   . Hypertension   . On continuous oral anticoagulation 08/22/2015   Started on Xarelto 08/12/2015   . Osteopenia   . Persistent atrial fibrillation ([place]) 08/22/2015   Started late August or early September.   . Personal history of radiation therapy 2017  . Radiation 04/21/16-05/19/16   left breast 47.72 Gy, boosted to 10 Gy  . Wears glasses     Past Surgical History:  Procedure Laterality Date  . BREAST BIOPSY Left 01/28/2016  . BREAST LUMPECTOMY Left 02/23/2016  . BREAST LUMPECTOMY WITH RADIOACTIVE SEED LOCALIZATION Left 02/23/2016   Procedure: BREAST LUMPECTOMY WITH RADIOACTIVE SEED LOCALIZATION;  Surgeon: [name], MD;  Location: [facility];  Service: General;  Laterality: Left;  . CARDIOVERSION N/A 10/02/2015   Procedure: CARDIOVERSION;  Surgeon: Jerline Pain, MD;  Location: [facility] ENDOSCOPY;  Service: Cardiovascular;  Laterality: N/A;  . COLONOSCOPY    . DUPUYTREN CONTRACTURE RELEASE  2001   leftx2  . [place]   right  . DUPUYTREN CONTRACTURE  RELEASE Right 05/02/2014   Procedure: EXCISION DUPUYTRENS RIGHT PALMAR/SMALL ;  Surgeon: Cammie Sickle, MD;  Location: [facility];  Service: Orthopedics;  Laterality: Right;  . TONSILLECTOMY  age 40    Social History   Socioeconomic History  . Marital status: Married    Spouse name: Not on file  . Number of children: 1  . Years of education: Not on file  . Highest education level: Not on file  Occupational History  . Not on file  Social Needs  . Financial resource strain: Not on file  . Food insecurity:    Worry: Not on file    Inability: Not on file  . Transportation needs:    Medical: Not on file    Non-medical: Not on file  Tobacco Use  . Smoking status: Former Smoker    Packs/day: 1.00    Years: 20.00    Pack years: 20.00    Types: Cigarettes    Last attempt to quit: 11/22/1992    Years since quitting: 25.3  . Smokeless tobacco: Never Used  Substance and Sexual Activity  . Alcohol use: Yes    Alcohol/week: 1.8 oz    Types: 3 Standard drinks or equivalent per week    Comment: social  . Drug use: No  . Sexual activity: Never    Partners: Male    Birth control/protection: Post-menopausal  Lifestyle  . Physical activity:    Days  per week: Not on file    Minutes per session: Not on file  . Stress: Not on file  Relationships  . Social connections:    Talks on phone: Not on file    Gets together: Not on file    Attends religious service: Not on file    Active member of club or organization: Not on file    Attends meetings of clubs or organizations: Not on file    Relationship status: Not on file  . Intimate partner violence:    Fear of current or ex partner: Not on file    Emotionally abused: Not on file    Physically abused: Not on file    Forced sexual activity: Not on file  Other Topics Concern  . Not on file  Social History Narrative  . Not on file    Current Outpatient Medications on File Prior to Visit  Medication Sig Dispense  Refill  . acetaminophen (TYLENOL) 325 MG tablet Take 650 mg by mouth every 6 (six) hours as needed for mild pain or headache.    . alendronate (FOSAMAX) 70 MG tablet Take 1 tablet (70 mg total) by mouth once a week. Take with a full glass of water on an empty stomach. 12 tablet 3  . allopurinol (ZYLOPRIM) 100 MG tablet Take 200 mg by mouth daily.     . calcitRIOL (ROCALTROL) 0.25 MCG capsule Take 1 capsule (0.25 mcg total) daily by mouth. 30 capsule 11  . celecoxib (CELEBREX) 200 MG capsule Take 200 mg by mouth 2 (two) times daily as needed for mild pain.    [name] LIALDA 1.2 G EC tablet Take 4.8 g by mouth daily.     . metoprolol succinate (TOPROL-XL) 50 MG 24 hr tablet Take 1 tablet (50 mg total) by mouth daily. Take with or immediately following a meal. 90 tablet 3  . simvastatin (ZOCOR) 20 MG tablet Take 20 mg by mouth daily.     . tamoxifen (NOLVADEX) 20 MG tablet TAKE 1 TABLET (20 MG TOTAL) DAILY BY MOUTH. 30 tablet 3  . XARELTO 20 MG TABS tablet TAKE 1 TABLET BY MOUTH  DAILY WITH SUPPER 90 tablet 3   No current facility-administered medications on file prior to visit.     No Known Allergies  Family History  Problem Relation Age of Onset  . Cirrhosis Mother   . Breast cancer Mother 33  . Heart failure Father   . Heart attack Father   . Breast cancer Sister        early 13's  . Kidney Stones Sister        loss of kidney due to stones  . Osteoporosis Neg Hx     BP 110/72   Pulse 100   Wt 126 lb 9.6 oz (57.4 kg)   SpO2 94%   BMI 21.73 kg/m    Review of Systems Denies falls.      Objective:   Physical Exam VITAL SIGNS:  See vs page GENERAL: no distress Spine: Old healed surgical scar.  Nontender.        Assessment & Plan:  Osteoporosis: she will be due for recheck soon. Vit-D def: due for recheck  Patient Instructions  blood tests are requested for you today.  We'll let you know about the results.   Let's recheck the bone density in August.   Please come back for  a follow-up appointment in 6 months.

## 2018-03-30 LAB — PTH, INTACT AND CALCIUM
Calcium: 9.1 mg/dL (ref 8.6–10.4)
PTH: 113 pg/mL — ABNORMAL HIGH (ref 14–64)

## 2018-04-28 ENCOUNTER — Telehealth: Payer: Self-pay | Admitting: Hematology

## 2018-04-28 NOTE — Telephone Encounter (Signed)
Called pt and left patient regarding lab/YF appt.  Appt was rescheduled from 8/12 to 8/26 beginning at 1:15 pm.  Mailed calendar for August.

## 2018-05-02 ENCOUNTER — Telehealth: Payer: Self-pay | Admitting: Endocrinology

## 2018-05-02 NOTE — Telephone Encounter (Signed)
[facility] called to inform Dr Loanne Drilling that Dr Brigitte Pulse will be taking over patients care and would not need to be seen in our office.  FYI

## 2018-05-29 NOTE — Progress Notes (Signed)
Cardiology Office Note   Date:  05/30/2018   ID:  Michele Green, DOB December 27, 1940, MRN [id number]  PCP:  Michele Neer, MD  Cardiologist:  Dr. Tamala Green    Chief Complaint  Patient presents with  . Atrial Fibrillation    Persistent Afib      History of Present Illness: Michele Green is a 77 y.o. female who presents for atrial fib.  First noted 08/2015 with DCCV.Michele Green  Chronic xarelto has refused DCCV and is rate controlled.    Last holter with HR 61 to 153 (12/20/17)  Average 100 BPM Echo with EF 60-65% mod MR and some prolapse.  Today her HR is elevated at 148 with mild ST depression lat leads, most likely due to HR.  She believes this has been ongoing since stopping lanoxin in Feb. 2019.  + SOB especially with walking up the stairs.  Has trouble sleeping at times with SOB.  No chest pain.  No fevers, colds or bleeding.    She is frustrated with her symptoms.  No syncope.  occ she is aware her heart is rapid, feels flutters.    Past Medical History:  Diagnosis Date  . Breast cancer ([address])   . Breast cancer of upper-outer quadrant of left female breast ([place]) 02/02/2016  . Colitis   . Colon polyp 02/2012  . Dupuytren contracture    bilateral hands  . Dysrhythmia   . Family history of breast cancer   . Hypertension   . On continuous oral anticoagulation 08/22/2015   Started on Xarelto 08/12/2015   . Osteopenia   . Persistent atrial fibrillation ([place]) 08/22/2015   Started late August or early September.   . Personal history of radiation therapy 2017  . Radiation 04/21/16-05/19/16   left breast 47.72 Gy, boosted to 10 Gy  . Wears glasses     Past Surgical History:  Procedure Laterality Date  . BREAST BIOPSY Left 01/28/2016  . BREAST LUMPECTOMY Left 02/23/2016  . BREAST LUMPECTOMY WITH RADIOACTIVE SEED LOCALIZATION Left 02/23/2016   Procedure: BREAST LUMPECTOMY WITH RADIOACTIVE SEED LOCALIZATION;  Surgeon: [name], MD;  Location: [facility];  Service:  General;  Laterality: Left;  . CARDIOVERSION N/A 10/02/2015   Procedure: CARDIOVERSION;  Surgeon: Jerline Pain, MD;  Location: [facility] ENDOSCOPY;  Service: Cardiovascular;  Laterality: N/A;  . COLONOSCOPY    . DUPUYTREN CONTRACTURE RELEASE  2001   leftx2  . [place]   right  . DUPUYTREN CONTRACTURE RELEASE Right 05/02/2014   Procedure: EXCISION DUPUYTRENS RIGHT PALMAR/SMALL ;  Surgeon: Michele Sickle, MD;  Location: [facility];  Service: Orthopedics;  Laterality: Right;  . TONSILLECTOMY  age 45     Current Outpatient Medications  Medication Sig Dispense Refill  . acetaminophen (TYLENOL) 325 MG tablet Take 650 mg by mouth every 6 (six) hours as needed for mild pain or headache.    . allopurinol (ZYLOPRIM) 100 MG tablet Take 200 mg by mouth daily.     . calcitRIOL (ROCALTROL) 0.25 MCG capsule Take 1 capsule (0.25 mcg total) daily by mouth. 30 capsule 11  . celecoxib (CELEBREX) 200 MG capsule Take 200 mg by mouth 2 (two) times daily as needed for mild pain.    Michele Green LIALDA 1.2 G EC tablet Take 4.8 g by mouth daily.     . metoprolol succinate (TOPROL-XL) 50 MG 24 hr tablet Take 1 tablet (50 mg total) by mouth daily. Take 50 mg for three  days and then take 1/2 tablet (25 mg) daily. Take with or immediately following a meal. 90 tablet 3  . simvastatin (ZOCOR) 20 MG tablet Take 20 mg by mouth daily.     . tamoxifen (NOLVADEX) 20 MG tablet TAKE 1 TABLET (20 MG TOTAL) DAILY BY MOUTH. 30 tablet 3  . Vitamin D, Ergocalciferol, 2000 units CAPS Take 4,000 Units by mouth daily. 30 capsule 11  . XARELTO 20 MG TABS tablet TAKE 1 TABLET BY MOUTH  DAILY WITH SUPPER 90 tablet 3  . digoxin (LANOXIN) 0.125 MG tablet Take 0.5 tablets (0.0625 mg total) by mouth daily. 45 tablet 3   No current facility-administered medications for this visit.     Allergies:   Patient has no known allergies.    Social History:  The patient  reports that she quit smoking about 25 years  ago. Her smoking use included cigarettes. She has a 20.00 pack-year smoking history. She has never used smokeless tobacco. She reports that she drinks about 1.8 oz of alcohol per week. She reports that she does not use drugs.   Family History:  The patient's family history includes Breast cancer in her sister; Breast cancer (age of onset: 28) in her mother; Cirrhosis in her mother; Heart attack in her father; Heart failure in her father; Kidney Stones in her sister.    ROS:  General:no colds or fevers, + weight decreased 5 lbs since Feb.   Skin:no rashes or ulcers HEENT:no blurred vision, no congestion CV:see HPI PUL:see HPI GI:no diarrhea constipation or melena, no indigestion GU:no hematuria, no dysuria MS:no joint pain, no claudication Neuro:no syncope, no lightheadedness Endo:no diabetes, no thyroid disease  Wt Readings from Last 3 Encounters:  05/30/18 125 lb (56.7 kg)  03/29/18 126 lb 9.6 oz (57.4 kg)  01/02/18 130 lb 4.8 oz (59.1 kg)     PHYSICAL EXAM: VS:  BP 106/74   Pulse (!) 148   Ht 5' 4"  (1.626 m)   Wt 125 lb (56.7 kg)   SpO2 98%   BMI 21.46 kg/m  , BMI Body mass index is 21.46 kg/m. General:Pleasant affect, NAD Skin:Warm and dry, brisk capillary refill HEENT:normocephalic, sclera clear, mucus membranes moist Neck:supple, no JVD, no bruits  Heart:rapid and irregular,  without murmur, gallup, rub or click Lungs:clear without rales, rhonchi, or wheezes VHQ:IONG, non tender, + BS, do not palpate liver spleen or masses Ext:tr lower ext edema, 2+ pedal pulses, 2+ radial pulses Neuro:alert and oriented X 3, MAE, follows commands, + facial symmetry    EKG:  EKG is ordered today. The ekg ordered today demonstrates A fib with RVR mild ST depression could be rate related no chest pain.    Recent Labs: 09/29/2017: TSH 1.96 01/02/2018: ALT 16; Hemoglobin 12.6; Platelets 130 03/29/2018: BUN 13; Creatinine, Ser 0.83; Potassium 4.3; Sodium 141    Lipid Panel No results  found for: CHOL, TRIG, HDL, CHOLHDL, VLDL, LDLCALC, LDLDIRECT     Other studies Reviewed: Additional studies/ records that were reviewed today include: . Echo 01/19/16  Study Conclusions  - Left ventricle: The cavity size was normal. Wall thickness was   normal. Systolic function was normal. The estimated ejection   fraction was in the range of 60% to 65%. Left ventricular   diastolic function parameters were normal. - Mitral valve: MV is thickened, difficult to see well Cannot   exclude some prolapse.   MR appears more moderate, some coursing posterior into LA. There   was moderate regurgitation. - Left  atrium: The atrium was severely dilated.  Last holter 12/20/17 with continues a fib and average HR 100.  Lanoxin stopped at that time.   ASSESSMENT AND PLAN:  1.  Permanent a fib now with RVR.  Discussed with DOD Dr. Lovena Le.  Will add back lanoxin at 0.0625 mg daily after 3 days decrease metoprolol to 25 mg.  Will have her return in 7-10 days for EKG and BP on day Dr. Tamala Green is here.  Then follow up in 4 weeks with APP or Dr. Tamala Green.  Will need echo but prefer when HR is slower.  No chest pain. Per OV note 03/2016 HR was 100 but 01/02/18 HR was 145.    2.  Tachycardia will check BMP, CBC and TSH looking for underlying causes of rapid HR.    3.  SOB will check BNP if elevated will do CXR.      4.  Breast cancer followed by oncology.     Current medicines are reviewed with the patient today.  The patient Has no concerns regarding medicines.  The following changes have been made:  See above Labs/ tests ordered today include:see above  Disposition:   FU:  see above  Signed, Cecilie Kicks, NP  05/30/2018 11:40 AM    [facility] [address], [place], [place], [place] Phone: [phone]; Fax: [phone]

## 2018-05-30 ENCOUNTER — Ambulatory Visit: Payer: Medicare Other | Admitting: Cardiology

## 2018-05-30 ENCOUNTER — Telehealth: Payer: Self-pay

## 2018-05-30 ENCOUNTER — Encounter: Payer: Self-pay | Admitting: Cardiology

## 2018-05-30 ENCOUNTER — Encounter (INDEPENDENT_AMBULATORY_CARE_PROVIDER_SITE_OTHER): Payer: Self-pay

## 2018-05-30 VITALS — BP 106/74 | HR 148 | Ht 64.0 in | Wt 125.0 lb

## 2018-05-30 DIAGNOSIS — R0602 Shortness of breath: Secondary | ICD-10-CM

## 2018-05-30 DIAGNOSIS — R000 Tachycardia, unspecified: Secondary | ICD-10-CM

## 2018-05-30 DIAGNOSIS — I481 Persistent atrial fibrillation: Secondary | ICD-10-CM | POA: Diagnosis not present

## 2018-05-30 DIAGNOSIS — I4819 Other persistent atrial fibrillation: Secondary | ICD-10-CM

## 2018-05-30 MED ORDER — POTASSIUM CHLORIDE CRYS ER 20 MEQ PO TBCR: 20.0000 meq | EXTENDED_RELEASE_TABLET | Freq: Every day | ORAL | 3 refills | Status: DC

## 2018-05-30 MED ORDER — METOPROLOL SUCCINATE ER 50 MG PO TB24: 50.0000 mg | ORAL_TABLET | Freq: Every day | ORAL | 3 refills | Status: DC

## 2018-05-30 MED ORDER — FUROSEMIDE 40 MG PO TABS: 40.0000 mg | ORAL_TABLET | Freq: Every day | ORAL | 3 refills | Status: DC

## 2018-05-30 MED ORDER — DIGOXIN 125 MCG PO TABS: 0.0625 mg | ORAL_TABLET | Freq: Every day | ORAL | 3 refills | Status: DC

## 2018-05-30 NOTE — Patient Instructions (Signed)
Medication Instructions:  1. START LANOXIN 0.125 MG 1/2 TABLET (0.0625 MG) DAILY.  2. TAKE METOPROLOL 50 MG DAILY FOR THREE DAYS, THEN CUT BACK TO 25 MG DAILY  Labwork: TODAY: BMET, CBC, TSH, BNP  FOLLOW UP IN 7-10 DAYS DIG LEVEL TO BE DONE  Testing/Procedures: NONE ORDERED  Follow-Up: Your physician recommends that you schedule a follow-up appointment in: 7-10 DAYS FOR EKG AND BLOOD PRESSURE CHECK WITH NURSE IN TRIAGE WHEN [name] IS THE OFFICE.  Your physician recommends that you schedule a follow-up appointment in: [place], NP OR [name]    Any Other Special Instructions Will Be Listed Below (If Applicable).     If you need a refill on your cardiac medications before your next appointment, please call your pharmacy.

## 2018-05-30 NOTE — Telephone Encounter (Signed)
Called patient with lab results. Per Cecilie Kicks NP, Let pt know she may also have fluid, so lets get 2 view CXR for shortness of breath today or AM.  Add lasix 40 mg daily beginning Thursday.  Also Kdur 20 meq daily.  She will need BMP next week. Patient verbalized understanding. Patient will go tomorrow for chest xray.

## 2018-05-30 NOTE — Telephone Encounter (Signed)
-----   Message from Isaiah Serge, NP sent at 05/30/2018  4:26 PM EDT ----- Let pt know she may also have fluid, so lets get 2 view CXR for shortness of breath.   Today or AM and add lasix 40 mg daily beginning Thursday.  Also Kdur 20 meq daily.  She will need BMP next week

## 2018-05-31 ENCOUNTER — Ambulatory Visit
Admission: RE | Admit: 2018-05-31 | Discharge: 2018-05-31 | Disposition: A | Discharge status: Home / Self Care | Payer: Medicare Other | Source: Ambulatory Visit | Attending: Cardiology | Admitting: Cardiology

## 2018-05-31 DIAGNOSIS — R0602 Shortness of breath: Secondary | ICD-10-CM

## 2018-05-31 LAB — CBC
HEMATOCRIT: 36.6 % (ref 34.0–46.6)
HEMOGLOBIN: 12.6 g/dL (ref 11.1–15.9)
MCH: 36.7 pg — AB (ref 26.6–33.0)
MCHC: 34.4 g/dL (ref 31.5–35.7)
MCV: 107 fL — AB (ref 79–97)
PLATELETS: 137 10*3/uL — AB (ref 150–450)
RBC: 3.43 x10E6/uL — ABNORMAL LOW (ref 3.77–5.28)
RDW: 13.4 % (ref 12.3–15.4)
WBC: 5.3 10*3/uL (ref 3.4–10.8)

## 2018-05-31 LAB — BASIC METABOLIC PANEL
BUN / CREAT RATIO: 15 (ref 12–28)
BUN: 14 mg/dL (ref 8–27)
CALCIUM: 9.8 mg/dL (ref 8.7–10.3)
CHLORIDE: 104 mmol/L (ref 96–106)
CO2: 21 mmol/L (ref 20–29)
CREATININE: 0.93 mg/dL (ref 0.57–1.00)
GFR calc non Af Amer: 59 mL/min/{1.73_m2} — ABNORMAL LOW (ref >59)
GFR, EST AFRICAN AMERICAN: 68 mL/min/{1.73_m2} (ref >59)
Glucose: 84 mg/dL (ref 65–99)
Potassium: 4.1 mmol/L (ref 3.5–5.2)
Sodium: 142 mmol/L (ref 134–144)

## 2018-05-31 LAB — PRO B NATRIURETIC PEPTIDE: NT-Pro BNP: 5122 pg/mL — ABNORMAL HIGH (ref 0–738)

## 2018-05-31 LAB — TSH: TSH: 1.2 u[IU]/mL (ref 0.450–4.500)

## 2018-06-08 ENCOUNTER — Ambulatory Visit (INDEPENDENT_AMBULATORY_CARE_PROVIDER_SITE_OTHER): Payer: Medicare Other | Admitting: Cardiology

## 2018-06-08 ENCOUNTER — Other Ambulatory Visit: Payer: Medicare Other | Admitting: *Deleted

## 2018-06-08 ENCOUNTER — Other Ambulatory Visit: Payer: Self-pay

## 2018-06-08 ENCOUNTER — Encounter: Payer: Self-pay | Admitting: Cardiology

## 2018-06-08 ENCOUNTER — Ambulatory Visit (HOSPITAL_COMMUNITY): Payer: Medicare Other | Attending: Cardiovascular Disease

## 2018-06-08 VITALS — BP 95/60 | HR 105 | Ht 64.0 in | Wt 121.8 lb

## 2018-06-08 DIAGNOSIS — R0602 Shortness of breath: Secondary | ICD-10-CM

## 2018-06-08 DIAGNOSIS — I4819 Other persistent atrial fibrillation: Secondary | ICD-10-CM

## 2018-06-08 DIAGNOSIS — Z923 Personal history of irradiation: Secondary | ICD-10-CM | POA: Insufficient documentation

## 2018-06-08 DIAGNOSIS — I5043 Acute on chronic combined systolic (congestive) and diastolic (congestive) heart failure: Secondary | ICD-10-CM | POA: Diagnosis not present

## 2018-06-08 DIAGNOSIS — R000 Tachycardia, unspecified: Secondary | ICD-10-CM

## 2018-06-08 DIAGNOSIS — I340 Nonrheumatic mitral (valve) insufficiency: Secondary | ICD-10-CM | POA: Insufficient documentation

## 2018-06-08 DIAGNOSIS — I119 Hypertensive heart disease without heart failure: Secondary | ICD-10-CM | POA: Diagnosis not present

## 2018-06-08 DIAGNOSIS — I481 Persistent atrial fibrillation: Secondary | ICD-10-CM

## 2018-06-08 LAB — ECHOCARDIOGRAM COMPLETE
HEIGHTINCHES: 64 in
WEIGHTICAEL: 1948.8 [oz_av]

## 2018-06-08 LAB — DIGOXIN LEVEL: Digoxin, Serum: 0.6 ng/mL (ref 0.5–0.9)

## 2018-06-08 MED ORDER — DIGOXIN 125 MCG PO TABS: 0.1250 mg | ORAL_TABLET | Freq: Every day | ORAL | 3 refills | Status: DC

## 2018-06-08 MED ORDER — FUROSEMIDE 20 MG PO TABS: 20.0000 mg | ORAL_TABLET | Freq: Every day | ORAL | 3 refills | Status: DC

## 2018-06-08 NOTE — Patient Instructions (Addendum)
Medication Instructions:  Your physician has recommended you make the following change in your medication:  1.  Increase your digoxin 0.125 mg - Take one tablet by mouth daily 2.  Decrease your lasix- We are changing you to a 20 mg tablet.  Take one tablet by mouth in the morning.  We will call you after we get your lab results to advise you about continued use of potassium.  Labwork: None ordered.  Testing/Procedures: Your physician has requested that you have an echocardiogram. Echocardiography is a painless test that uses sound waves to create images of your heart. It provides your doctor with information about the size and shape of your heart and how well your heart's chambers and valves are working. This procedure takes approximately one hour. There are no restrictions for this procedure.  Please schedule for an ECHO 06/08/2018 at 3:00 pm or tomorrow 06/09/2018 at 3:00 pm.  Follow-Up: Keep your upcoming appointment with Estella Husk scheduled for 07/11/2018 at 1:00 pm.  If your ECHO is abnormal we will call you with a sooner appointment.  Any Other Special Instructions Will Be Listed Below (If Applicable).  If you need a refill on your cardiac medications before your next appointment, please call your pharmacy.

## 2018-06-08 NOTE — Progress Notes (Signed)
Cardiology Office Note   Date:  06/08/2018   ID:  Michele Green, DOB 1941-06-27, MRN [id number]  PCP:  [name], MD  Cardiologist:  [name]    Chief Complaint  Patient presents with  . Atrial Fibrillation      History of Present Illness: Michele Green is a 77 y.o. female who presents for follow up of atrial fib after beginning lanoxin and lasix.    Her a fib was first noted 08/2015 with DCCV.[name]  Chronic xarelto has refused DCCV and rate was controlled.    Last holter with HR 61 to 153 (12/20/17)  Average 100 BPM  Her lanoxin was stopped.  Echo with EF 60-65% mod MR and some prolapse.   She has done well until recently and she had increasing SOB and swelling and feeling her heart beat which was new for her.   We added back low dose dig. Lasix for HF with pro BNP of 5,000, and decreased BB.    Today she is here for EKG and labs but HR is still not controlled.  Though is improved.  She has no more edema and she does not wake up SOB.   No chest pain.       Past Medical History:  Diagnosis Date  . Breast cancer ([place])   . Breast cancer of upper-outer quadrant of left female breast ([place]) 02/02/2016  . Colitis   . Colon polyp 02/2012  . Dupuytren contracture    bilateral hands  . Dysrhythmia   . Family history of breast cancer   . Hypertension   . On continuous oral anticoagulation 08/22/2015   Started on Xarelto 08/12/2015   . Osteopenia   . Persistent atrial fibrillation ([place]) 08/22/2015   Started late August or early September.   . Personal history of radiation therapy 2017  . Radiation 04/21/16-05/19/16   left breast 47.72 Gy, boosted to 10 Gy  . Wears glasses     Past Surgical History:  Procedure Laterality Date  . BREAST BIOPSY Left 01/28/2016  . BREAST LUMPECTOMY Left 02/23/2016  . BREAST LUMPECTOMY WITH RADIOACTIVE SEED LOCALIZATION Left 02/23/2016   Procedure: BREAST LUMPECTOMY WITH RADIOACTIVE SEED LOCALIZATION;  Surgeon: [name], MD;   Location: [facility];  Service: General;  Laterality: Left;  . CARDIOVERSION N/A 10/02/2015   Procedure: CARDIOVERSION;  Surgeon: Jerline Pain, MD;  Location: [facility] ENDOSCOPY;  Service: Cardiovascular;  Laterality: N/A;  . COLONOSCOPY    . DUPUYTREN CONTRACTURE RELEASE  2001   leftx2  . [place]   right  . DUPUYTREN CONTRACTURE RELEASE Right 05/02/2014   Procedure: EXCISION DUPUYTRENS RIGHT PALMAR/SMALL ;  Surgeon: Cammie Sickle, MD;  Location: [facility];  Service: Orthopedics;  Laterality: Right;  . TONSILLECTOMY  age 54     Current Outpatient Medications  Medication Sig Dispense Refill  . acetaminophen (TYLENOL) 325 MG tablet Take 650 mg by mouth every 6 (six) hours as needed for mild pain or headache.    . allopurinol (ZYLOPRIM) 100 MG tablet Take 200 mg by mouth daily.     . calcitRIOL (ROCALTROL) 0.25 MCG capsule Take 1 capsule (0.25 mcg total) daily by mouth. 30 capsule 11  . celecoxib (CELEBREX) 200 MG capsule Take 200 mg by mouth 2 (two) times daily as needed for mild pain.    [name] digoxin (LANOXIN) 0.125 MG tablet Take 1 tablet (0.125 mg total) by mouth daily. 90 tablet 3  .  furosemide (LASIX) 20 MG tablet Take 1 tablet (20 mg total) by mouth daily. 90 tablet 3  . LIALDA 1.2 G EC tablet Take 4.8 g by mouth daily.     . metoprolol succinate (TOPROL-XL) 50 MG 24 hr tablet Take 1 tablet (50 mg total) by mouth daily. Take 50 mg for three days and then take 1/2 tablet (25 mg) daily. Take with or immediately following a meal. 90 tablet 3  . potassium chloride SA (K-DUR,KLOR-CON) 20 MEQ tablet Take 1 tablet (20 mEq total) by mouth daily. 90 tablet 3  . simvastatin (ZOCOR) 20 MG tablet Take 20 mg by mouth daily.     . tamoxifen (NOLVADEX) 20 MG tablet TAKE 1 TABLET (20 MG TOTAL) DAILY BY MOUTH. 30 tablet 3  . Vitamin D, Ergocalciferol, 2000 units CAPS Take 4,000 Units by mouth daily. 30 capsule 11  . XARELTO 20 MG TABS tablet TAKE 1  TABLET BY MOUTH  DAILY WITH SUPPER 90 tablet 3   No current facility-administered medications for this visit.     Allergies:   Patient has no known allergies.    Social History:  The patient  reports that she quit smoking about 25 years ago. Her smoking use included cigarettes. She has a 20.00 pack-year smoking history. She has never used smokeless tobacco. She reports that she drinks about 1.8 oz of alcohol per week. She reports that she does not use drugs.   Family History:  The patient's family history includes Breast cancer in her sister; Breast cancer (age of onset: 8) in her mother; Cirrhosis in her mother; Heart attack in her father; Heart failure in her father; Kidney Stones in her sister.    ROS:  General:no colds or fevers,  weight is down 4 lbs.   Skin:no rashes or ulcers HEENT:no blurred vision, no congestion CV:see HPI PUL:see HPI GI:no diarrhea constipation or melena, no indigestion GU:no hematuria, no dysuria MS:no joint pain, no claudication Neuro:no syncope, no lightheadedness Endo:no diabetes, no thyroid disease  Wt Readings from Last 3 Encounters:  06/08/18 121 lb 12.8 oz (55.2 kg)  05/30/18 125 lb (56.7 kg)  03/29/18 126 lb 9.6 oz (57.4 kg)     PHYSICAL EXAM: VS:  BP 95/60   Pulse (!) 105   Ht 5' 4"  (1.626 m)   Wt 121 lb 12.8 oz (55.2 kg)   BMI 20.91 kg/m  , BMI Body mass index is 20.91 kg/m. General:Pleasant affect, NAD Skin:Warm and dry, brisk capillary refill HEENT:normocephalic, sclera clear, mucus membranes moist Neck:supple, no JVD, no bruits  Heart irreg irreg without murmur, gallup, rub or click Lungs:clear without rales, rhonchi, or wheezes ZLD:JTTS, non tender, + BS, do not palpate liver spleen or masses Ext:no lower ext edema, 2+ pedal pulses, 2+ radial pulses Neuro:alert and oriented X 3, MAE, follows commands, + facial symmetry    EKG:  EKG is ordered today. The ekg ordered today demonstrates  A fib with RVR 105 and higher.      Recent Labs: 01/02/2018: ALT 16 05/30/2018: BUN 14; Creatinine, Ser 0.93; Hemoglobin 12.6; NT-Pro BNP 5,122; Platelets 137; Potassium 4.1; Sodium 142; TSH 1.200    Lipid Panel No results found for: CHOL, TRIG, HDL, CHOLHDL, VLDL, LDLCALC, LDLDIRECT     Other studies Reviewed: Additional studies/ records that were reviewed today include: . Echo 01/19/16 Study Conclusions  - Left ventricle: The cavity size was normal. Wall thickness was   normal. Systolic function was normal. The estimated ejection   fraction was  in the range of 60% to 65%. Left ventricular   diastolic function parameters were normal. - Mitral valve: MV is thickened, difficult to see well Cannot   exclude some prolapse.   MR appears more moderate, some coursing posterior into LA. There   was moderate regurgitation. - Left atrium: The atrium was severely dilated.  ASSESSMENT AND PLAN:  1.  Permanent a fib with RVR, now improved on lanoxin, checked Dig level today.  Discussed with [name] and will increase lanoxin to 0.125 mg daily.  Will also do echo to eval LV function with rapid HR, possible decrease.  If EF is down will most likely admit for further work up.  She is anticoagulated on xarelto and has not missed any doses.  Plan will be once echo results back.   2.  CHF due to rapid HR has improved with lasix, will decrease to 20 mg daily and will let her know K+ adjustment when I get BNP back.    3.  SOB has improved with diuresing.     Current medicines are reviewed with the patient today.  The patient Has no concerns regarding medicines.  The following changes have been made:  See above Labs/ tests ordered today include:see above  Disposition:   FU:  see above  Signed, Cecilie Kicks, NP  06/08/2018 5:54 PM    [facility] [place], [place], [place], [place] Phone: [phone]; Fax: [phone]

## 2018-06-09 ENCOUNTER — Other Ambulatory Visit: Payer: Self-pay | Admitting: *Deleted

## 2018-06-09 DIAGNOSIS — Z79899 Other long term (current) drug therapy: Secondary | ICD-10-CM

## 2018-06-09 DIAGNOSIS — I10 Essential (primary) hypertension: Secondary | ICD-10-CM

## 2018-06-13 ENCOUNTER — Other Ambulatory Visit: Payer: Medicare Other

## 2018-06-13 NOTE — Progress Notes (Signed)
CARDIOLOGY OFFICE NOTE  Date:  06/14/2018    [name] Date of Birth: [birth year] Medical Record #[id number]  PCP:  [name], MD  Cardiologist:  Tamala Julian   Chief Complaint  Patient presents with  . Atrial Fibrillation    Follow up visit - seen for [name]    History of Present Illness: Michele Green is a 77 y.o. female who presents today for a 2 week check. Seen for [name].   She has a history of atrial fib - she has elected to be managed with rate control and Xarelto. Other issues include HTN, & breast cancer.   Seen here earlier this month by Cecilie Kicks, NP - HR was not controlled.  Her lanoxin was added back to get better rate control.   Comes in today. Here with her sister [name]. She notes that she is feeling better. HR is better controlled. She has less shortness of breath at night. No swelling. Has lost a few pounds. Notes considerable change in her symptoms over the past month but now has improved. Sleeping better. No chest pain. No history of rheumatic fever.   Past Medical History:  Diagnosis Date  . Breast cancer ([place])   . Breast cancer of upper-outer quadrant of left female breast ([place]) 02/02/2016  . Colitis   . Colon polyp 02/2012  . Dupuytren contracture    bilateral hands  . Dysrhythmia   . Family history of breast cancer   . Hypertension   . On continuous oral anticoagulation 08/22/2015   Started on Xarelto 08/12/2015   . Osteopenia   . Persistent atrial fibrillation ([place]) 08/22/2015   Started late August or early September.   . Personal history of radiation therapy 2017  . Radiation 04/21/16-05/19/16   left breast 47.72 Gy, boosted to 10 Gy  . Wears glasses     Past Surgical History:  Procedure Laterality Date  . BREAST BIOPSY Left 01/28/2016  . BREAST LUMPECTOMY Left 02/23/2016  . BREAST LUMPECTOMY WITH RADIOACTIVE SEED LOCALIZATION Left 02/23/2016   Procedure: BREAST LUMPECTOMY WITH RADIOACTIVE SEED LOCALIZATION;  Surgeon:  [name], MD;  Location: [facility];  Service: General;  Laterality: Left;  . CARDIOVERSION N/A 10/02/2015   Procedure: CARDIOVERSION;  Surgeon: Jerline Pain, MD;  Location: [facility] ENDOSCOPY;  Service: Cardiovascular;  Laterality: N/A;  . COLONOSCOPY    . DUPUYTREN CONTRACTURE RELEASE  2001   leftx2  . [place]   right  . DUPUYTREN CONTRACTURE RELEASE Right 05/02/2014   Procedure: EXCISION DUPUYTRENS RIGHT PALMAR/SMALL ;  Surgeon: Cammie Sickle, MD;  Location: [facility];  Service: Orthopedics;  Laterality: Right;  . TONSILLECTOMY  age 24     Medications: Current Meds  Medication Sig  . acetaminophen (TYLENOL) 325 MG tablet Take 650 mg by mouth every 6 (six) hours as needed for mild pain or headache.  . allopurinol (ZYLOPRIM) 100 MG tablet Take 200 mg by mouth daily.   . calcitRIOL (ROCALTROL) 0.25 MCG capsule Take 1 capsule (0.25 mcg total) daily by mouth.  . celecoxib (CELEBREX) 200 MG capsule Take 200 mg by mouth 2 (two) times daily as needed for mild pain.  [name] digoxin (LANOXIN) 0.125 MG tablet Take 0.0625 mg by mouth daily. Take one half tablet (0.0625 mg ) daily  . furosemide (LASIX) 20 MG tablet Take 1 tablet (20 mg total) by mouth daily.  [name] LIALDA 1.2 G EC tablet Take 4.8  g by mouth daily.   . metoprolol succinate (TOPROL-XL) 50 MG 24 hr tablet Take 1 tablet (50 mg total) by mouth daily. Take 50 mg for three days and then take 1/2 tablet (25 mg) daily. Take with or immediately following a meal.  . metoprolol succinate (TOPROL-XL) 50 MG 24 hr tablet Take 25 mg by mouth daily. Take one half tablet by mouth (25 mg ) daily. Take with or immediately following a meal.  . potassium chloride SA (K-DUR,KLOR-CON) 20 MEQ tablet Take 1 tablet (20 mEq total) by mouth daily.  . simvastatin (ZOCOR) 20 MG tablet Take 20 mg by mouth daily.   . tamoxifen (NOLVADEX) 20 MG tablet TAKE 1 TABLET (20 MG TOTAL) DAILY BY MOUTH.  . Vitamin D,  Ergocalciferol, 2000 units CAPS Take 4,000 Units by mouth daily.  Alveda Reasons 20 MG TABS tablet TAKE 1 TABLET BY MOUTH  DAILY WITH SUPPER     Allergies: No Known Allergies  Social History: The patient  reports that she quit smoking about 25 years ago. Her smoking use included cigarettes. She has a 20.00 pack-year smoking history. She has never used smokeless tobacco. She reports that she drinks about 1.8 oz of alcohol per week. She reports that she does not use drugs.   Family History: The patient's family history includes Breast cancer in her sister; Breast cancer (age of onset: 41) in her mother; Cirrhosis in her mother; Heart attack in her father; Heart failure in her father; Kidney Stones in her sister.   Review of Systems: Please see the history of present illness.   Otherwise, the review of systems is positive for none.   All other systems are reviewed and negative.   Physical Exam: VS:  BP 118/70 (BP Location: Left Arm, Patient Position: Sitting, Cuff Size: Normal)   Pulse 93   Ht 5' 4"  (1.626 m)   Wt 122 lb 12.8 oz (55.7 kg)   BMI 21.08 kg/m  .  BMI Body mass index is 21.08 kg/m.  Wt Readings from Last 3 Encounters:  06/14/18 122 lb 12.8 oz (55.7 kg)  06/08/18 121 lb 12.8 oz (55.2 kg)  05/30/18 125 lb (56.7 kg)    General: Pleasant. Elderly but looks younger than her stated age. She is alert and in no acute distress.   HEENT: Normal.  Neck: Supple, no JVD, carotid bruits, or masses noted.  Cardiac: Irregular irregular rhythm. Rate is ok.  Harsh systolic murmur.  No edema.  Respiratory:  Lungs are clear to auscultation bilaterally with normal work of breathing.  GI: Soft and nontender.  MS: No deformity or atrophy. Gait and ROM intact.  Skin: Warm and dry. Color is normal.  Neuro:  Strength and sensation are intact and no gross focal deficits noted.  Psych: Alert, appropriate and with normal affect.   LABORATORY DATA:  EKG:  EKG is ordered today. This demonstrates  AF with more controlled VR. Reviewed with [name]  Lab Results  Component Value Date   WBC 5.3 05/30/2018   HGB 12.6 05/30/2018   HCT 36.6 05/30/2018   PLT 137 (L) 05/30/2018   GLUCOSE 84 05/30/2018   ALT 16 01/02/2018   AST 23 01/02/2018   NA 142 05/30/2018   K 4.1 05/30/2018   CL 104 05/30/2018   CREATININE 0.93 05/30/2018   BUN 14 05/30/2018   CO2 21 05/30/2018   TSH 1.200 05/30/2018   INR 1.19 08/24/2017     BNP (last 3 results) No results for  input(s): BNP in the last 8760 hours.  ProBNP (last 3 results) Recent Labs    05/30/18 1115  PROBNP 5,122*     Other Studies Reviewed Today:  Echo Study Conclusions 05/2018  - Left ventricle: The cavity size was normal. Wall thickness was   increased in a pattern of mild LVH. Systolic function was normal.   The estimated ejection fraction was in the range of 60% to 65%.   Wall motion was normal; there were no regional wall motion   abnormalities. - Mitral valve: Mildly to moderately calcified annulus. Mildly   thickened, mildly calcified leaflets . There was moderate to   severe regurgitation. - Left atrium: The atrium was massively dilated. - Pulmonary arteries: Systolic pressure was mildly increased. PA   peak pressure: 33 mm Hg (S).   Assessment/Plan:  1. Moderate to severe MR - progressive with associated symptoms - she has improved with better rate control and diuretic therapy. ? Chord rupture with more regurgitation. She is subsequently seen with [name] today. He has recommended TEE and left/right heart catheterization. He has advised holding Xarelto only 24 hours. The patient understands that risks include but are not limited to stroke (1 in 1000), death (1 in 61), kidney failure [usually temporary] (1 in 500), bleeding (1 in 200), allergic reaction [possibly serious] (1 in 200), and agrees to proceed. The patient understands that the risks associated with a TEE include sore throat, aspiration,  perforation/tear of the esophagus and oversedation.   2. Permanent AF - she is back on Lanoxin - her rate looks better.   3. Chronic anticoagulation - no adverse effects noted.   4. HTN - BP is fine today.    Current medicines are reviewed with the patient today.  The patient does not have concerns regarding medicines other than what has been noted above.  The following changes have been made:  See above.  Labs/ tests ordered today include:    Orders Placed This Encounter  Procedures  . Comprehensive metabolic panel  . CBC  . EKG 12-Lead     Disposition:   FU with me in a few weeks.    Patient is agreeable to this plan and will call if any problems develop in the interim.   SignedTruitt Merle, NP  06/14/2018 9:07 AM  [facility] [address] [place], [place]  [address] Phone: [phone] Fax: [phone]

## 2018-06-13 NOTE — H&P (View-Only) (Signed)
CARDIOLOGY OFFICE NOTE  Date:  06/14/2018    Michele Green Date of Birth: Apr 05, 1941 Medical Record #[id number]  PCP:  [name], MD  Cardiologist:  Tamala Julian   Chief Complaint  Patient presents with  . Atrial Fibrillation    Follow up visit - seen for [name]    History of Present Illness: Michele Green is a 77 y.o. female who presents today for a 2 week check. Seen for [name].   She has a history of atrial fib - she has elected to be managed with rate control and Xarelto. Other issues include HTN, & breast cancer.   Seen here earlier this month by Cecilie Kicks, NP - HR was not controlled.  Her lanoxin was added back to get better rate control.   Comes in today. Here with her sister [name]. She notes that she is feeling better. HR is better controlled. She has less shortness of breath at night. No swelling. Has lost a few pounds. Notes considerable change in her symptoms over the past month but now has improved. Sleeping better. No chest pain. No history of rheumatic fever.   Past Medical History:  Diagnosis Date  . Breast cancer ([place])   . Breast cancer of upper-outer quadrant of left female breast ([place]) 02/02/2016  . Colitis   . Colon polyp 02/2012  . Dupuytren contracture    bilateral hands  . Dysrhythmia   . Family history of breast cancer   . Hypertension   . On continuous oral anticoagulation 08/22/2015   Started on Xarelto 08/12/2015   . Osteopenia   . Persistent atrial fibrillation ([place]) 08/22/2015   Started late August or early September.   . Personal history of radiation therapy 2017  . Radiation 04/21/16-05/19/16   left breast 47.72 Gy, boosted to 10 Gy  . Wears glasses     Past Surgical History:  Procedure Laterality Date  . BREAST BIOPSY Left 01/28/2016  . BREAST LUMPECTOMY Left 02/23/2016  . BREAST LUMPECTOMY WITH RADIOACTIVE SEED LOCALIZATION Left 02/23/2016   Procedure: BREAST LUMPECTOMY WITH RADIOACTIVE SEED LOCALIZATION;  Surgeon:  [name], MD;  Location: [facility];  Service: General;  Laterality: Left;  . CARDIOVERSION N/A 10/02/2015   Procedure: CARDIOVERSION;  Surgeon: Jerline Pain, MD;  Location: [facility] ENDOSCOPY;  Service: Cardiovascular;  Laterality: N/A;  . COLONOSCOPY    . DUPUYTREN CONTRACTURE RELEASE  2001   leftx2  . [place]   right  . DUPUYTREN CONTRACTURE RELEASE Right 05/02/2014   Procedure: EXCISION DUPUYTRENS RIGHT PALMAR/SMALL ;  Surgeon: Cammie Sickle, MD;  Location: [facility];  Service: Orthopedics;  Laterality: Right;  . TONSILLECTOMY  age 57     Medications: Current Meds  Medication Sig  . acetaminophen (TYLENOL) 325 MG tablet Take 650 mg by mouth every 6 (six) hours as needed for mild pain or headache.  . allopurinol (ZYLOPRIM) 100 MG tablet Take 200 mg by mouth daily.   . calcitRIOL (ROCALTROL) 0.25 MCG capsule Take 1 capsule (0.25 mcg total) daily by mouth.  . celecoxib (CELEBREX) 200 MG capsule Take 200 mg by mouth 2 (two) times daily as needed for mild pain.  [name] digoxin (LANOXIN) 0.125 MG tablet Take 0.0625 mg by mouth daily. Take one half tablet (0.0625 mg ) daily  . furosemide (LASIX) 20 MG tablet Take 1 tablet (20 mg total) by mouth daily.  [name] LIALDA 1.2 G EC tablet Take 4.8  g by mouth daily.   . metoprolol succinate (TOPROL-XL) 50 MG 24 hr tablet Take 1 tablet (50 mg total) by mouth daily. Take 50 mg for three days and then take 1/2 tablet (25 mg) daily. Take with or immediately following a meal.  . metoprolol succinate (TOPROL-XL) 50 MG 24 hr tablet Take 25 mg by mouth daily. Take one half tablet by mouth (25 mg ) daily. Take with or immediately following a meal.  . potassium chloride SA (K-DUR,KLOR-CON) 20 MEQ tablet Take 1 tablet (20 mEq total) by mouth daily.  . simvastatin (ZOCOR) 20 MG tablet Take 20 mg by mouth daily.   . tamoxifen (NOLVADEX) 20 MG tablet TAKE 1 TABLET (20 MG TOTAL) DAILY BY MOUTH.  . Vitamin D,  Ergocalciferol, 2000 units CAPS Take 4,000 Units by mouth daily.  Alveda Reasons 20 MG TABS tablet TAKE 1 TABLET BY MOUTH  DAILY WITH SUPPER     Allergies: No Known Allergies  Social History: The patient  reports that she quit smoking about 25 years ago. Her smoking use included cigarettes. She has a 20.00 pack-year smoking history. She has never used smokeless tobacco. She reports that she drinks about 1.8 oz of alcohol per week. She reports that she does not use drugs.   Family History: The patient's family history includes Breast cancer in her sister; Breast cancer (age of onset: 41) in her mother; Cirrhosis in her mother; Heart attack in her father; Heart failure in her father; Kidney Stones in her sister.   Review of Systems: Please see the history of present illness.   Otherwise, the review of systems is positive for none.   All other systems are reviewed and negative.   Physical Exam: VS:  BP 118/70 (BP Location: Left Arm, Patient Position: Sitting, Cuff Size: Normal)   Pulse 93   Ht 5' 4"  (1.626 m)   Wt 122 lb 12.8 oz (55.7 kg)   BMI 21.08 kg/m  .  BMI Body mass index is 21.08 kg/m.  Wt Readings from Last 3 Encounters:  06/14/18 122 lb 12.8 oz (55.7 kg)  06/08/18 121 lb 12.8 oz (55.2 kg)  05/30/18 125 lb (56.7 kg)    General: Pleasant. Elderly but looks younger than her stated age. She is alert and in no acute distress.   HEENT: Normal.  Neck: Supple, no JVD, carotid bruits, or masses noted.  Cardiac: Irregular irregular rhythm. Rate is ok.  Harsh systolic murmur.  No edema.  Respiratory:  Lungs are clear to auscultation bilaterally with normal work of breathing.  GI: Soft and nontender.  MS: No deformity or atrophy. Gait and ROM intact.  Skin: Warm and dry. Color is normal.  Neuro:  Strength and sensation are intact and no gross focal deficits noted.  Psych: Alert, appropriate and with normal affect.   LABORATORY DATA:  EKG:  EKG is ordered today. This demonstrates  AF with more controlled VR. Reviewed with [name]  Lab Results  Component Value Date   WBC 5.3 05/30/2018   HGB 12.6 05/30/2018   HCT 36.6 05/30/2018   PLT 137 (L) 05/30/2018   GLUCOSE 84 05/30/2018   ALT 16 01/02/2018   AST 23 01/02/2018   NA 142 05/30/2018   K 4.1 05/30/2018   CL 104 05/30/2018   CREATININE 0.93 05/30/2018   BUN 14 05/30/2018   CO2 21 05/30/2018   TSH 1.200 05/30/2018   INR 1.19 08/24/2017     BNP (last 3 results) No results for  input(s): BNP in the last 8760 hours.  ProBNP (last 3 results) Recent Labs    05/30/18 1115  PROBNP 5,122*     Other Studies Reviewed Today:  Echo Study Conclusions 05/2018  - Left ventricle: The cavity size was normal. Wall thickness was   increased in a pattern of mild LVH. Systolic function was normal.   The estimated ejection fraction was in the range of 60% to 65%.   Wall motion was normal; there were no regional wall motion   abnormalities. - Mitral valve: Mildly to moderately calcified annulus. Mildly   thickened, mildly calcified leaflets . There was moderate to   severe regurgitation. - Left atrium: The atrium was massively dilated. - Pulmonary arteries: Systolic pressure was mildly increased. PA   peak pressure: 33 mm Hg (S).   Assessment/Plan:  1. Moderate to severe MR - progressive with associated symptoms - she has improved with better rate control and diuretic therapy. ? Chord rupture with more regurgitation. She is subsequently seen with [name] today. He has recommended TEE and left/right heart catheterization. He has advised holding Xarelto only 24 hours. The patient understands that risks include but are not limited to stroke (1 in 1000), death (1 in 33), kidney failure [usually temporary] (1 in 500), bleeding (1 in 200), allergic reaction [possibly serious] (1 in 200), and agrees to proceed. The patient understands that the risks associated with a TEE include sore throat, aspiration,  perforation/tear of the esophagus and oversedation.   2. Permanent AF - she is back on Lanoxin - her rate looks better.   3. Chronic anticoagulation - no adverse effects noted.   4. HTN - BP is fine today.    Current medicines are reviewed with the patient today.  The patient does not have concerns regarding medicines other than what has been noted above.  The following changes have been made:  See above.  Labs/ tests ordered today include:    Orders Placed This Encounter  Procedures  . Comprehensive metabolic panel  . CBC  . EKG 12-Lead     Disposition:   FU with me in a few weeks.    Patient is agreeable to this plan and will call if any problems develop in the interim.   SignedTruitt Merle, NP  06/14/2018 9:07 AM  [facility] [address] [place], [place]  [address] Phone: [phone] Fax: [phone]

## 2018-06-14 ENCOUNTER — Encounter: Payer: Self-pay | Admitting: Nurse Practitioner

## 2018-06-14 ENCOUNTER — Ambulatory Visit: Payer: Medicare Other | Admitting: Nurse Practitioner

## 2018-06-14 ENCOUNTER — Encounter (INDEPENDENT_AMBULATORY_CARE_PROVIDER_SITE_OTHER): Payer: Self-pay

## 2018-06-14 VITALS — BP 118/70 | HR 93 | Ht 64.0 in | Wt 122.8 lb

## 2018-06-14 DIAGNOSIS — I4819 Other persistent atrial fibrillation: Secondary | ICD-10-CM

## 2018-06-14 DIAGNOSIS — I10 Essential (primary) hypertension: Secondary | ICD-10-CM | POA: Diagnosis not present

## 2018-06-14 DIAGNOSIS — I340 Nonrheumatic mitral (valve) insufficiency: Secondary | ICD-10-CM | POA: Diagnosis not present

## 2018-06-14 DIAGNOSIS — I481 Persistent atrial fibrillation: Secondary | ICD-10-CM

## 2018-06-14 DIAGNOSIS — Z7901 Long term (current) use of anticoagulants: Secondary | ICD-10-CM | POA: Diagnosis not present

## 2018-06-14 LAB — COMPREHENSIVE METABOLIC PANEL
ALT: 26 IU/L (ref 0–32)
AST: 38 IU/L (ref 0–40)
Albumin/Globulin Ratio: 1.5 (ref 1.2–2.2)
Albumin: 4 g/dL (ref 3.5–4.8)
Alkaline Phosphatase: 62 IU/L (ref 39–117)
BUN/Creatinine Ratio: 16 (ref 12–28)
BUN: 13 mg/dL (ref 8–27)
Bilirubin Total: 0.7 mg/dL (ref 0.0–1.2)
CO2: 23 mmol/L (ref 20–29)
Calcium: 9.1 mg/dL (ref 8.7–10.3)
Chloride: 103 mmol/L (ref 96–106)
Creatinine, Ser: 0.81 mg/dL (ref 0.57–1.00)
GFR calc Af Amer: 80 mL/min/{1.73_m2} (ref >59)
GFR calc non Af Amer: 69 mL/min/{1.73_m2} (ref >59)
Globulin, Total: 2.7 g/dL (ref 1.5–4.5)
Glucose: 97 mg/dL (ref 65–99)
Potassium: 4.3 mmol/L (ref 3.5–5.2)
Sodium: 140 mmol/L (ref 134–144)
Total Protein: 6.7 g/dL (ref 6.0–8.5)

## 2018-06-14 LAB — CBC
Hematocrit: 36.2 % (ref 34.0–46.6)
Hemoglobin: 12.8 g/dL (ref 11.1–15.9)
MCH: 36.2 pg — ABNORMAL HIGH (ref 26.6–33.0)
MCHC: 35.4 g/dL (ref 31.5–35.7)
MCV: 102 fL — ABNORMAL HIGH (ref 79–97)
Platelets: 133 10*3/uL — ABNORMAL LOW (ref 150–450)
RBC: 3.54 x10E6/uL — ABNORMAL LOW (ref 3.77–5.28)
RDW: 13.1 % (ref 12.3–15.4)
WBC: 4.7 10*3/uL (ref 3.4–10.8)

## 2018-06-14 NOTE — Patient Instructions (Addendum)
We will be checking the following labs today - CMET and CBC   Medication Instructions:    Continue with your current medicines.     Testing/Procedures To Be Arranged:  TEE and left/right heart catheterization  Follow-Up:   See me in 2 weeks post cath/TEE visit    Other Special Instructions:  Your provider has recommended a cardiac catherization  You are scheduled for a cardiac catheterization on Tuesday, July 30th at 12 noon with [name] or associate.  Please arrive at the [facility] (Main Entrance) at [facility] at [address], [place] Stay on Tuesday, July 30th at 6:30 AM.    Special note: Every effort is made to have your procedure done on time.   Please understand that emergencies sometimes delay a scheduled   procedure.  Nothing to eat or drink after midnight on Monday.     You may take your morning medications with a sip of water on the day of your procedure.  Please take a baby aspirin (81 mg) on the morning of your procedure.   Medications to HOLD - Xarelto 24 hours prior DO NOT TAKE LASIX OR POTASSIUM on [place] for a one night stay -- bring personal belongings.  Bring a current list of your medications and current insurance cards.  You MUST have a responsible person to drive you home. Someone MUST be with you the first 24 hours after you arrive home or your discharge will be delayed. Wear clothes that are easy to get on and off and wear slip on shoes.    Coronary Angiogram A coronary angiogram, also called coronary angiography, is an X-ray procedure used to look at the arteries in the heart. In this procedure, a dye (contrast dye) is injected through a long, hollow tube (catheter). The catheter is about the size of a piece of cooked spaghetti and is inserted through your groin, wrist, or arm. The dye is injected into each artery, and X-rays are then taken to show if there is a blockage in the arteries of your  heart.  LET [facility] CARE PROVIDER KNOW ABOUT: Any allergies you have, including allergies to shellfish or contrast dye.   All medicines you are taking, including vitamins, herbs, eye drops, creams, and over-the-counter medicines.   Previous problems you or members of your family have had with the use of anesthetics.   Any blood disorders you have.   Previous surgeries you have had. History of kidney problems or failure.   Other medical conditions you have.  RISKS AND COMPLICATIONS  Generally, a coronary angiogram is a safe procedure. However, about 1 person out of 1000 can have problems that may include: Allergic reaction to the dye. Bleeding/bruising from the access site or other locations. Kidney injury, especially in people with impaired kidney function.  Stroke (rare). Heart attack (rare). Irregular rhythms (rare) Death (rare)  BEFORE THE PROCEDURE  Do not eat or drink anything after midnight the night before the procedure or as directed by your health care provider.   Ask your health care provider about changing or stopping your regular medicines. This is especially important if you are taking diabetes medicines or blood thinners.  PROCEDURE You may be given a medicine to help you relax (sedative) before the procedure. This medicine is given through an intravenous (IV) access tube that is inserted into one of your veins.   The area where the catheter will be inserted will be washed  and shaved. This is usually done in the groin but may be done in the fold of your arm (near your elbow) or in the wrist.    A medicine will be given to numb the area where the catheter will be inserted (local anesthetic).   The health care provider will insert the catheter into an artery. The catheter will be guided by using a special type of X-ray (fluoroscopy) of the blood vessel being examined.   A special dye will then be injected into the catheter, and X-rays will be taken. The dye will help to  show where any narrowing or blockages are located in the heart arteries.     AFTER THE PROCEDURE  If the procedure is done through the leg, you will be kept in bed lying flat for several hours. You will be instructed to not bend or cross your legs. The insertion site will be checked frequently.   The pulse in your feet or wrist will be checked frequently.   Additional blood tests, X-rays, and an electrocardiogram may be done.        Your provider has recommended a TEE - transesophageal echocardiogram.  You are scheduled for your TEE on Tuesday, July 30th at [facility] with Dr. Debara Pickett or associates. Please go to [facility] 2nd [place] Stay at Tuesday, July 30th at 6:30 AM.  Enter through the [place] not have any food or drink after midnight on Monday.  You may take your medicines with a sip of water on the day of your procedure.  You will need someone to drive you home following your procedure.    Call the [place] office at [phone] if you have any questions, problems or concerns.   Transesophageal Echocardiogram Transesophageal echocardiography (TEE) is a special type of test that produces images of the heart by using sound waves (echocardiogram). This type of echocardiography can obtain better images of the heart than standard echocardiography. TEE is done by passing a flexible tube down the esophagus. The heart is located in front of the esophagus. Because the heart and esophagus are close to one another, your health care provider can take very clear, detailed pictures of the heart via ultrasound waves. TEE may be done:  If your health care provider needs more information based on standard echocardiography findings.  If you had a stroke. This might have happened because a clot formed in your heart. TEE can visualize different areas of the heart and check for clots.  To check valve anatomy and function.  To check for infection on  the inside of your heart (endocarditis).  To evaluate the dividing wall (septum) of the heart and presence of a hole that did not close after birth (patent foramen ovale or atrial septal defect).  To help diagnose a tear in the wall of the aorta (aortic dissection).  During cardiac valve surgery. This allows the surgeon to assess the valve repair before closing the chest.  During a variety of other cardiac procedures to guide positioning of catheters.  Sometimes before a cardioversion, which is a shock to convert heart rhythm back to normal.  LET [facility] CARE PROVIDER KNOW ABOUT:   Any allergies you have.  All medicines you are taking, including vitamins, herbs, eye drops, creams, and over-the-counter medicines.  Previous problems you or members of your family have had with the use of anesthetics.  Any blood disorders you have.  Previous surgeries you have had.  Medical conditions you have.  Swallowing difficulties.  An esophageal obstruction.  RISKS AND COMPLICATIONS  Generally, TEE is a safe procedure. However, as with any procedure, complications can occur. Possible complications include an esophageal tear (rupture), perforation, aspiration and/or oversedation. You may have a sore throat following this procedure.  BEFORE THE PROCEDURE   Do not eat or drink for 6 hours before the procedure or as directed by your health care provider.  Arrange for someone to drive you home after the procedure. Do not drive yourself home. During the procedure, you will be given medicines that can continue to make you feel drowsy and can impair your reflexes.  An IV access tube will be started in the arm.  PROCEDURE   A medicine to help you relax (sedative) will be given through the IV access tube.  A medicine may be sprayed or gargled to numb the back of the throat.  Your blood pressure, heart rate, and breathing (vital signs) will be monitored during the procedure.  The TEE probe  is a long, flexible tube. The tip of the probe is placed into the back of the mouth, and you will be asked to swallow. This helps to pass the tip of the probe into the esophagus. Once the tip of the probe is in the correct area, your health care provider can take pictures of the heart.  TEE is usually not a painful procedure. You may feel the probe press against the back of the throat. The probe does not enter the trachea and does not affect your breathing.  AFTER THE PROCEDURE   You will be in bed, resting, until you have fully returned to consciousness.  When you first awaken, your throat may feel slightly sore and will probably still feel numb. This will improve slowly over time.  You will not be allowed to eat or drink until it is clear that the numbness has improved.  Once you have been able to drink, urinate, and sit on the edge of the bed without feeling sick to your stomach (nausea) or dizzy, you may be cleared to go home.  You should have a friend or family member with you for the next 24 hours after your procedure.        If you need a refill on your cardiac medications before your next appointment, please call your pharmacy.   Call the [place] office at [phone] if you have any questions, problems or concerns.

## 2018-06-19 ENCOUNTER — Telehealth: Payer: Self-pay | Admitting: *Deleted

## 2018-06-19 NOTE — Telephone Encounter (Addendum)
Catheterization/TEE scheduled at [facility] for: Tuesday June 20, 2018 12 noon/TEE 8AM Verify arrival time and place: [place] Entrance A at: 6:30 AM  Nothing to eat or drink after midnight prior to procedures.  Verified no known allergies. Veridied no diabetes medications.  Hold: Xarelto-last dose 06/17/18 until post procedure. Furosemide AM of procedure. KCl AM of procedure.  Except hold medications AM meds can be  taken pre-cath with sip of water including: ASA 81 mg  Confirm patient has responsible person to drive home post procedure and for 24 hours after you arrive home:yes

## 2018-06-20 ENCOUNTER — Encounter (HOSPITAL_COMMUNITY): Payer: Self-pay | Admitting: Internal Medicine

## 2018-06-20 ENCOUNTER — Encounter (HOSPITAL_COMMUNITY)
Admission: RE | Disposition: A | Discharge status: Home / Self Care | Payer: Self-pay | Source: Ambulatory Visit | Attending: Internal Medicine

## 2018-06-20 ENCOUNTER — Ambulatory Visit (HOSPITAL_COMMUNITY)
Admission: RE | Admit: 2018-06-20 | Discharge: 2018-06-20 | Disposition: A | Discharge status: Home / Self Care | Payer: Medicare Other | Source: Ambulatory Visit | Attending: Internal Medicine | Admitting: Internal Medicine

## 2018-06-20 ENCOUNTER — Ambulatory Visit (HOSPITAL_BASED_OUTPATIENT_CLINIC_OR_DEPARTMENT_OTHER): Payer: Medicare Other

## 2018-06-20 ENCOUNTER — Other Ambulatory Visit: Payer: Self-pay

## 2018-06-20 DIAGNOSIS — I2510 Atherosclerotic heart disease of native coronary artery without angina pectoris: Secondary | ICD-10-CM | POA: Diagnosis not present

## 2018-06-20 DIAGNOSIS — E785 Hyperlipidemia, unspecified: Secondary | ICD-10-CM | POA: Diagnosis present

## 2018-06-20 DIAGNOSIS — M8580 Other specified disorders of bone density and structure, unspecified site: Secondary | ICD-10-CM | POA: Diagnosis not present

## 2018-06-20 DIAGNOSIS — I340 Nonrheumatic mitral (valve) insufficiency: Secondary | ICD-10-CM | POA: Diagnosis not present

## 2018-06-20 DIAGNOSIS — I110 Hypertensive heart disease with heart failure: Secondary | ICD-10-CM | POA: Diagnosis not present

## 2018-06-20 DIAGNOSIS — Z87891 Personal history of nicotine dependence: Secondary | ICD-10-CM | POA: Diagnosis not present

## 2018-06-20 DIAGNOSIS — Z8249 Family history of ischemic heart disease and other diseases of the circulatory system: Secondary | ICD-10-CM | POA: Diagnosis not present

## 2018-06-20 DIAGNOSIS — I482 Chronic atrial fibrillation: Secondary | ICD-10-CM | POA: Insufficient documentation

## 2018-06-20 DIAGNOSIS — Z7901 Long term (current) use of anticoagulants: Secondary | ICD-10-CM | POA: Insufficient documentation

## 2018-06-20 DIAGNOSIS — I272 Pulmonary hypertension, unspecified: Secondary | ICD-10-CM | POA: Diagnosis not present

## 2018-06-20 DIAGNOSIS — I5043 Acute on chronic combined systolic (congestive) and diastolic (congestive) heart failure: Secondary | ICD-10-CM | POA: Diagnosis not present

## 2018-06-20 DIAGNOSIS — I5032 Chronic diastolic (congestive) heart failure: Secondary | ICD-10-CM

## 2018-06-20 DIAGNOSIS — I10 Essential (primary) hypertension: Secondary | ICD-10-CM | POA: Diagnosis present

## 2018-06-20 DIAGNOSIS — I4819 Other persistent atrial fibrillation: Secondary | ICD-10-CM | POA: Diagnosis present

## 2018-06-20 HISTORY — PX: RIGHT/LEFT HEART CATH AND CORONARY ANGIOGRAPHY: CATH118266

## 2018-06-20 HISTORY — PX: TEE WITHOUT CARDIOVERSION: SHX5443

## 2018-06-20 HISTORY — DX: Atherosclerotic heart disease of native coronary artery without angina pectoris: I25.10

## 2018-06-20 LAB — POCT I-STAT 3, VENOUS BLOOD GAS (G3P V)
ACID-BASE DEFICIT: 2 mmol/L (ref 0.0–2.0)
ACID-BASE DEFICIT: 2 mmol/L (ref 0.0–2.0)
Bicarbonate: 23.1 mmol/L (ref 20.0–28.0)
Bicarbonate: 23.2 mmol/L (ref 20.0–28.0)
O2 SAT: 60 %
O2 SAT: 60 %
PO2 VEN: 32 mmHg (ref 32.0–45.0)
TCO2: 24 mmol/L (ref 22–32)
TCO2: 24 mmol/L (ref 22–32)
pCO2, Ven: 40 mmHg — ABNORMAL LOW (ref 44.0–60.0)
pCO2, Ven: 40.1 mmHg — ABNORMAL LOW (ref 44.0–60.0)
pH, Ven: 7.369 (ref 7.250–7.430)
pH, Ven: 7.37 (ref 7.250–7.430)
pO2, Ven: 32 mmHg (ref 32.0–45.0)

## 2018-06-20 LAB — POCT I-STAT 3, ART BLOOD GAS (G3+)
Acid-base deficit: 3 mmol/L — ABNORMAL HIGH (ref 0.0–2.0)
Bicarbonate: 21.3 mmol/L (ref 20.0–28.0)
O2 Saturation: 88 %
PH ART: 7.385 (ref 7.350–7.450)
TCO2: 22 mmol/L (ref 22–32)
pCO2 arterial: 35.6 mmHg (ref 32.0–48.0)
pO2, Arterial: 55 mmHg — ABNORMAL LOW (ref 83.0–108.0)

## 2018-06-20 SURGERY — RIGHT/LEFT HEART CATH AND CORONARY ANGIOGRAPHY
Anesthesia: LOCAL

## 2018-06-20 SURGERY — ECHOCARDIOGRAM, TRANSESOPHAGEAL
Anesthesia: Moderate Sedation

## 2018-06-20 MED ORDER — MIDAZOLAM HCL 5 MG/5ML IJ SOLN
INTRAMUSCULAR | Status: DC | PRN
  Administered 2018-06-20: 2 mg via INTRAVENOUS
  Administered 2018-06-20: 1 mg via INTRAVENOUS

## 2018-06-20 MED ORDER — LIDOCAINE VISCOUS HCL 2 % MT SOLN
OROMUCOSAL | Status: AC
  Filled 2018-06-20: qty 15

## 2018-06-20 MED ORDER — MIDAZOLAM HCL 10 MG/2ML IJ SOLN
INTRAMUSCULAR | Status: DC | PRN
  Administered 2018-06-20: 1 mg via INTRAVENOUS

## 2018-06-20 MED ORDER — LIDOCAINE HCL (PF) 1 % IJ SOLN
INTRAMUSCULAR | Status: AC
  Filled 2018-06-20: qty 30

## 2018-06-20 MED ORDER — VERAPAMIL HCL 2.5 MG/ML IV SOLN
INTRAVENOUS | Status: DC | PRN
  Administered 2018-06-20: 12:00:00 via INTRA_ARTERIAL

## 2018-06-20 MED ORDER — ASPIRIN 81 MG PO CHEW
81.0000 mg | CHEWABLE_TABLET | ORAL | Status: DC
  Filled 2018-06-20: qty 1

## 2018-06-20 MED ORDER — VERAPAMIL HCL 2.5 MG/ML IV SOLN
INTRAVENOUS | Status: AC
  Filled 2018-06-20: qty 2

## 2018-06-20 MED ORDER — HEPARIN (PORCINE) IN NACL 1000-0.9 UT/500ML-% IV SOLN
INTRAVENOUS | Status: DC | PRN
  Administered 2018-06-20: 500 mL

## 2018-06-20 MED ORDER — ACETAMINOPHEN 325 MG PO TABS: 650.0000 mg | ORAL_TABLET | ORAL | Status: DC | PRN

## 2018-06-20 MED ORDER — MIDAZOLAM HCL 2 MG/2ML IJ SOLN
INTRAMUSCULAR | Status: AC
  Filled 2018-06-20: qty 2

## 2018-06-20 MED ORDER — LIDOCAINE HCL (PF) 1 % IJ SOLN
INTRAMUSCULAR | Status: DC | PRN
  Administered 2018-06-20 (×2): 2 mL

## 2018-06-20 MED ORDER — IOPAMIDOL (ISOVUE-370) INJECTION 76%
INTRAVENOUS | Status: DC | PRN
  Administered 2018-06-20: 90 mL via INTRA_ARTERIAL

## 2018-06-20 MED ORDER — IOPAMIDOL (ISOVUE-370) INJECTION 76%
INTRAVENOUS | Status: AC
  Filled 2018-06-20: qty 100

## 2018-06-20 MED ORDER — FENTANYL CITRATE (PF) 100 MCG/2ML IJ SOLN
INTRAMUSCULAR | Status: AC
  Filled 2018-06-20: qty 2

## 2018-06-20 MED ORDER — MIDAZOLAM HCL 2 MG/2ML IJ SOLN
INTRAMUSCULAR | Status: DC | PRN
  Administered 2018-06-20: 1 mg via INTRAVENOUS

## 2018-06-20 MED ORDER — SODIUM CHLORIDE 0.9 % IV SOLN
INTRAVENOUS | Status: DC
  Administered 2018-06-20: 07:00:00 via INTRAVENOUS

## 2018-06-20 MED ORDER — FENTANYL CITRATE (PF) 100 MCG/2ML IJ SOLN
INTRAMUSCULAR | Status: DC | PRN
  Administered 2018-06-20: 25 ug via INTRAVENOUS

## 2018-06-20 MED ORDER — MIDAZOLAM HCL 5 MG/ML IJ SOLN
INTRAMUSCULAR | Status: AC
  Filled 2018-06-20: qty 2

## 2018-06-20 MED ORDER — SODIUM CHLORIDE 0.9 % IV SOLN: 250.0000 mL | INTRAVENOUS | Status: DC | PRN

## 2018-06-20 MED ORDER — SODIUM CHLORIDE 0.9% FLUSH: 3.0000 mL | Freq: Two times a day (BID) | INTRAVENOUS | Status: DC

## 2018-06-20 MED ORDER — SODIUM CHLORIDE 0.9 % IV SOLN: INTRAVENOUS | Status: DC

## 2018-06-20 MED ORDER — LIDOCAINE VISCOUS HCL 2 % MT SOLN
OROMUCOSAL | Status: DC | PRN
  Administered 2018-06-20: 20 mL via OROMUCOSAL

## 2018-06-20 MED ORDER — HEPARIN SODIUM (PORCINE) 1000 UNIT/ML IJ SOLN
INTRAMUSCULAR | Status: DC | PRN
  Administered 2018-06-20: 3000 [IU] via INTRAVENOUS

## 2018-06-20 MED ORDER — OXYCODONE HCL 5 MG PO TABS: 5.0000 mg | ORAL_TABLET | ORAL | Status: DC | PRN

## 2018-06-20 MED ORDER — HEPARIN SODIUM (PORCINE) 1000 UNIT/ML IJ SOLN
INTRAMUSCULAR | Status: AC
  Filled 2018-06-20: qty 1

## 2018-06-20 MED ORDER — SODIUM CHLORIDE 0.9 % IV SOLN: INTRAVENOUS | Status: AC

## 2018-06-20 MED ORDER — HEPARIN (PORCINE) IN NACL 1000-0.9 UT/500ML-% IV SOLN
INTRAVENOUS | Status: AC
  Filled 2018-06-20: qty 500

## 2018-06-20 MED ORDER — SODIUM CHLORIDE 0.9% FLUSH: 3.0000 mL | INTRAVENOUS | Status: DC | PRN

## 2018-06-20 MED ORDER — ONDANSETRON HCL 4 MG/2ML IJ SOLN: 4.0000 mg | Freq: Four times a day (QID) | INTRAMUSCULAR | Status: DC | PRN

## 2018-06-20 SURGICAL SUPPLY — 13 items
CATH 5FR JL3.5 JR4 ANG PIG MP (CATHETERS) ×1 IMPLANT
CATH BALLN WEDGE 5F 110CM (CATHETERS) ×1 IMPLANT
DEVICE RAD COMP TR BAND LRG (VASCULAR PRODUCTS) ×1 IMPLANT
GLIDESHEATH SLEND A-KIT 6F 22G (SHEATH) ×1 IMPLANT
GUIDEWIRE INQWIRE 1.5J.035X260 (WIRE) IMPLANT
INQWIRE 1.5J .035X260CM (WIRE) ×2
KIT HEART LEFT (KITS) ×2 IMPLANT
PACK CARDIAC CATHETERIZATION (CUSTOM PROCEDURE TRAY) ×2 IMPLANT
SHEATH GLIDE SLENDER 4/5FR (SHEATH) ×1 IMPLANT
SHEATH PROBE COVER 6X72 (BAG) ×1 IMPLANT
SYR MEDRAD MARK V 150ML (SYRINGE) ×1 IMPLANT
TRANSDUCER W/STOPCOCK (MISCELLANEOUS) ×2 IMPLANT
TUBING CIL FLEX 10 FLL-RA (TUBING) ×2 IMPLANT

## 2018-06-20 NOTE — Progress Notes (Signed)
Pt took ASA 81 mg this morning at 0500 with sip of water.

## 2018-06-20 NOTE — H&P (Signed)
   INTERVAL PROCEDURE H&P  History and Physical Interval Note:  06/20/2018 7:58 AM  Michele Green has presented today for their planned procedure. The various methods of treatment have been discussed with the patient and family. After consideration of risks, benefits and other options for treatment, the patient has consented to the procedure.  The patients' outpatient history has been reviewed, patient examined, and no change in status from most recent office note within the past 30 days. I have reviewed the patients' chart and labs and will proceed as planned. Questions were answered to the patient's satisfaction.   Pixie Casino, MD, [facility], [place] Director of the Advanced Lipid Disorders &  Cardiovascular Risk Reduction Clinic Diplomate of the American Board of Clinical Lipidology Attending Cardiologist  Direct Dial: [phone]  Fax: [phone]  Website:  [email or web address] Green 06/20/2018, 7:58 AM

## 2018-06-20 NOTE — Interval H&P Note (Signed)
Cath Lab Visit (complete for each Cath Lab visit)  Clinical Evaluation Leading to the Procedure:   ACS: No.  Non-ACS:    Anginal Classification: CCS Green  Anti-ischemic medical therapy: Minimal Therapy (1 class of medications)  Non-Invasive Test Results: No non-invasive testing performed  Prior CABG: No previous CABG      History and Physical Interval Note:  06/20/2018 11:24 AM  Michele Green  has presented today for surgery, with the diagnosis of mr  The various methods of treatment have been discussed with the patient and family. After consideration of risks, benefits and other options for treatment, the patient has consented to  Procedure(s): RIGHT/LEFT HEART CATH AND CORONARY ANGIOGRAPHY (N/A) as a surgical intervention .  The patient's history has been reviewed, patient examined, no change in status, stable for surgery.  I have reviewed the patient's chart and labs.  Questions were answered to the patient's satisfaction.     Michele Green

## 2018-06-20 NOTE — Discharge Instructions (Signed)
Radial Site Care Refer to this sheet in the next few weeks. These instructions provide you with information about caring for yourself after your procedure. Your health care provider may also give you more specific instructions. Your treatment has been planned according to current medical practices, but problems sometimes occur. Call your health care provider if you have any problems or questions after your procedure. What can I expect after the procedure? After your procedure, it is typical to have the following: Bruising at the radial site that usually fades within 1-2 weeks. Blood collecting in the tissue (hematoma) that may be painful to the touch. It should usually decrease in size and tenderness within 1-2 weeks.  Follow these instructions at home: Take medicines only as directed by your health care provider. You may shower 24-48 hours after the procedure or as directed by your health care provider. Remove the bandage (dressing) and gently wash the site with plain soap and water. Pat the area dry with a clean towel. Do not rub the site, because this may cause bleeding. Do not take baths, swim, or use a hot tub until your health care provider approves. Check your insertion site every day for redness, swelling, or drainage. Do not apply powder or lotion to the site. Do not flex or bend the affected arm for 24 hours or as directed by your health care provider. Do not push or pull heavy objects with the affected arm for 24 hours or as directed by your health care provider. Do not lift over 10 lb (4.5 kg) for 5 days after your procedure or as directed by your health care provider. Ask your health care provider when it is okay to: Return to work or school. Resume usual physical activities or sports. Resume sexual activity. Do not drive home if you are discharged the same day as the procedure. Have someone else drive you. You may drive 24 hours after the procedure unless otherwise instructed by  your health care provider. Do not operate machinery or power tools for 24 hours after the procedure. If your procedure was done as an outpatient procedure, which means that you went home the same day as your procedure, a responsible adult should be with you for the first 24 hours after you arrive home. Keep all follow-up visits as directed by your health care provider. This is important. Contact a health care provider if: You have a fever. You have chills. You have increased bleeding from the radial site. Hold pressure on the site. Get help right away if: You have unusual pain at the radial site. You have redness, warmth, or swelling at the radial site. You have drainage (other than a small amount of blood on the dressing) from the radial site. The radial site is bleeding, and the bleeding does not stop after 30 minutes of holding steady pressure on the site. Your arm or hand becomes pale, cool, tingly, or numb. This information is not intended to replace advice given to you by your health care provider. Make sure you discuss any questions you have with your health care provider. Document Released: 12/11/2010 Document Revised: 04/15/2016 Document Reviewed: 05/27/2014 Elsevier Interactive Patient Education  2018 [employer].    Moderate Conscious Sedation, Adult, Care After These instructions provide you with information about caring for yourself after your procedure. Your health care provider may also give you more specific instructions. Your treatment has been planned according to current medical practices, but problems sometimes occur. Call your health care provider if  you have any problems or questions after your procedure. What can I expect after the procedure? After your procedure, it is common:  To feel sleepy for several hours.  To feel clumsy and have poor balance for several hours.  To have poor judgment for several hours.  To vomit if you eat too soon.  Follow these  instructions at home: For at least 24 hours after the procedure:   Do not: ? Participate in activities where you could fall or become injured. ? Drive. ? Use heavy machinery. ? Drink alcohol. ? Take sleeping pills or medicines that cause drowsiness. ? Make important decisions or sign legal documents. ? Take care of children on your own.  Rest. Eating and drinking  Follow the diet recommended by your health care provider.  If you vomit: ? Drink water, juice, or soup when you can drink without vomiting. ? Make sure you have little or no nausea before eating solid foods. General instructions  Have a responsible adult stay with you until you are awake and alert.  Take over-the-counter and prescription medicines only as told by your health care provider.  If you smoke, do not smoke without supervision.  Keep all follow-up visits as told by your health care provider. This is important. Contact a health care provider if:  You keep feeling nauseous or you keep vomiting.  You feel light-headed.  You develop a rash.  You have a fever. Get help right away if:  You have trouble breathing. This information is not intended to replace advice given to you by your health care provider. Make sure you discuss any questions you have with your health care provider. Document Released: 08/29/2013 Document Revised: 04/12/2016 Document Reviewed: 02/28/2016 Elsevier Interactive Patient Education  2018 [employer].     TEE  YOU HAD AN CARDIAC PROCEDURE TODAY: Refer to the procedure report and other information in the discharge instructions given to you for any specific questions about what was found during the examination. If this information does not answer your questions, please call [facility] office at [phone] to clarify.   DIET: Your first meal following the procedure should be a light meal and then it is ok to progress to your normal diet. A half-sandwich or bowl of soup  is an example of a good first meal. Heavy or fried foods are harder to digest and may make you feel nauseous or bloated. Drink plenty of fluids but you should avoid alcoholic beverages for 24 hours. If you had a esophageal dilation, please see attached instructions for diet.   ACTIVITY: Your care partner should take you home directly after the procedure. You should plan to take it easy, moving slowly for the rest of the day. You can resume normal activity the day after the procedure however YOU SHOULD NOT DRIVE, use power tools, machinery or perform tasks that involve climbing or major physical exertion for 24 hours (because of the sedation medicines used during the test).   SYMPTOMS TO REPORT IMMEDIATELY: A cardiologist can be reached at any hour. Please call [phone] for any of the following symptoms:  Vomiting of blood or coffee ground material  New, significant abdominal pain  New, significant chest pain or pain under the shoulder blades  Painful or persistently difficult swallowing  New shortness of breath  Black, tarry-looking or red, bloody stools  FOLLOW UP:  Please also call with any specific questions about appointments or follow up tests.

## 2018-06-20 NOTE — Progress Notes (Signed)
  Echocardiogram Echocardiogram Transesophageal has been performed.  Michele Green 06/20/2018, 8:46 AM

## 2018-06-20 NOTE — CV Procedure (Signed)
TRANSESOPHAGEAL ECHOCARDIOGRAM (TEE) NOTE  INDICATIONS: mitral regurgitation  PROCEDURE:   Informed consent was obtained prior to the procedure. The risks, benefits and alternatives for the procedure were discussed and the patient comprehended these risks.  Risks include, but are not limited to, cough, sore throat, vomiting, nausea, somnolence, esophageal and stomach trauma or perforation, bleeding, low blood pressure, aspiration, pneumonia, infection, trauma to the teeth and death.    After a procedural time-out, the patient was given 3 mg versed and 25 mcg fentanyl for moderate sedation.  The patient's heart rate, blood pressure, and oxygen saturation are monitored continuously during the procedure.The oropharynx was anesthetized 10 cc of topical 1% viscous lidocaine.  The transesophageal probe was inserted in the esophagus and stomach without difficulty and multiple views were obtained.  The patient was kept under observation until the patient left the procedure room.  The period of conscious sedation is 20 minutes, of which I was present face-to-face 100% of this time. The patient left the procedure room in stable condition.   Agitated microbubble saline contrast was not administered.  COMPLICATIONS:    There were no immediate complications.  Findings:  1. LEFT VENTRICLE: The left ventricular wall thickness is mildly increased.  The left ventricular cavity is normal in size. Wall motion is normal.  LVEF is 60-65%.  2. RIGHT VENTRICLE:  The right ventricle is normal in structure and function without any thrombus or masses.    3. LEFT ATRIUM:  The left atrium is massively dilated in size without any thrombus or masses.  There is spontaneous echo contrast ("smoke") in the left atrium consistent with a low flow state.  4. LEFT ATRIAL APPENDAGE:  The left atrial appendage is free of any thrombus or masses. The appendage has single lobes and windsock appearance. Pulse doppler indicates  moderate flow in the appendage.  5. ATRIAL SEPTUM:  The atrial septum appears intact and is free of thrombus and/or masses.  There is no evidence for interatrial shunting by color doppler and saline microbubble.  6. RIGHT ATRIUM:  The right atrium is normal in size and function without any thrombus or masses.  7. MITRAL VALVE:  The mitral valve is minimally calcified. The annulus measures 3.4 cm (non-dilated). There is thickening and some restriction of the posterior leaflet with severe, eccentric regurgitation.  There were no vegetations or stenosis.  8. AORTIC VALVE:  The aortic valve is trileaflet, normal in structure and function with no regurgitation.  There were no vegetations or stenosis  9. TRICUSPID VALVE:  The tricuspid valve is normal in structure and function with Mild regurgitation.  There were no vegetations or stenosis  10.  PULMONIC VALVE:  The pulmonic valve is normal in structure and function with trivial regurgitation.  There were no vegetations or stenosis.   11. AORTIC ARCH, ASCENDING AND DESCENDING AORTA:  There was grade 1 Ron Parker et. Al, 1992) atherosclerosis of the ascending aorta, aortic arch, or proximal descending aorta.  12. PULMONARY VEINS: Anomalous pulmonary venous return was not noted.  13. PERICARDIUM: The pericardium appeared normal and non-thickened.  There is no pericardial effusion.  IMPRESSION:   1. 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 Although she has severe LAE, the annulus is not significantly dilated to call this functional MR. Minimal calcification is noted -therefore, may be a mitraclip candidate. 2. Massive LAE 3. No LAA thrombus 4. Negative for PFO by color doppler 5. LVEF 60-65%  RECOMMENDATIONS:  1.  Proceed with R/LHC today - d/w [name] her cardiologist.  Time Spent Directly with the Patient:  81 minutes   Pixie Casino, MD, [facility], [place] Director of the [facility] Lipid Disorders &  Cardiovascular Risk [facility] Diplomate of the American Board of Clinical Lipidology Attending Cardiologist  Direct Dial: [phone]  Fax: [phone]  Website:  [email or web address] 06/20/2018, 8:46 AM

## 2018-06-21 ENCOUNTER — Encounter (HOSPITAL_COMMUNITY): Payer: Self-pay | Admitting: Interventional Cardiology

## 2018-06-25 ENCOUNTER — Other Ambulatory Visit: Payer: Self-pay | Admitting: Hematology

## 2018-06-27 ENCOUNTER — Other Ambulatory Visit: Payer: Self-pay | Admitting: Nurse Practitioner

## 2018-06-27 MED ORDER — TAMOXIFEN CITRATE 20 MG PO TABS: 20.0000 mg | ORAL_TABLET | Freq: Every day | ORAL | 3 refills | Status: DC

## 2018-07-03 ENCOUNTER — Encounter: Payer: Self-pay | Admitting: Nurse Practitioner

## 2018-07-03 ENCOUNTER — Other Ambulatory Visit: Payer: Medicare Other

## 2018-07-03 ENCOUNTER — Ambulatory Visit: Payer: Medicare Other | Admitting: Hematology

## 2018-07-04 ENCOUNTER — Encounter: Payer: Self-pay | Admitting: Nurse Practitioner

## 2018-07-04 ENCOUNTER — Encounter (INDEPENDENT_AMBULATORY_CARE_PROVIDER_SITE_OTHER): Payer: Self-pay

## 2018-07-04 ENCOUNTER — Ambulatory Visit: Payer: Medicare Other | Admitting: Nurse Practitioner

## 2018-07-04 VITALS — BP 118/70 | HR 69 | Ht 64.0 in | Wt 126.8 lb

## 2018-07-04 DIAGNOSIS — I10 Essential (primary) hypertension: Secondary | ICD-10-CM | POA: Diagnosis not present

## 2018-07-04 DIAGNOSIS — Z9889 Other specified postprocedural states: Secondary | ICD-10-CM

## 2018-07-04 DIAGNOSIS — Z7901 Long term (current) use of anticoagulants: Secondary | ICD-10-CM

## 2018-07-04 DIAGNOSIS — Z79899 Other long term (current) drug therapy: Secondary | ICD-10-CM | POA: Diagnosis not present

## 2018-07-04 DIAGNOSIS — I481 Persistent atrial fibrillation: Secondary | ICD-10-CM

## 2018-07-04 DIAGNOSIS — I4819 Other persistent atrial fibrillation: Secondary | ICD-10-CM

## 2018-07-04 LAB — BASIC METABOLIC PANEL
BUN/Creatinine Ratio: 15 (ref 12–28)
BUN: 13 mg/dL (ref 8–27)
CO2: 21 mmol/L (ref 20–29)
Calcium: 9.3 mg/dL (ref 8.7–10.3)
Chloride: 106 mmol/L (ref 96–106)
Creatinine, Ser: 0.85 mg/dL (ref 0.57–1.00)
GFR calc Af Amer: 75 mL/min/{1.73_m2} (ref >59)
GFR calc non Af Amer: 65 mL/min/{1.73_m2} (ref >59)
Glucose: 92 mg/dL (ref 65–99)
Potassium: 4.4 mmol/L (ref 3.5–5.2)
Sodium: 141 mmol/L (ref 134–144)

## 2018-07-04 LAB — CBC
Hematocrit: 36.1 % (ref 34.0–46.6)
Hemoglobin: 12.5 g/dL (ref 11.1–15.9)
MCH: 36.4 pg — ABNORMAL HIGH (ref 26.6–33.0)
MCHC: 34.6 g/dL (ref 31.5–35.7)
MCV: 105 fL — ABNORMAL HIGH (ref 79–97)
Platelets: 132 10*3/uL — ABNORMAL LOW (ref 150–450)
RBC: 3.43 x10E6/uL — ABNORMAL LOW (ref 3.77–5.28)
RDW: 12.8 % (ref 12.3–15.4)
WBC: 5.7 10*3/uL (ref 3.4–10.8)

## 2018-07-04 MED ORDER — DIGOXIN 125 MCG PO TABS: 0.0625 mg | ORAL_TABLET | Freq: Every day | ORAL | 3 refills | Status: DC

## 2018-07-04 NOTE — Patient Instructions (Addendum)
We will be checking the following labs today - BMET and CBC   Medication Instructions:    Continue with your current medicines.   I sent in your refill for the Lanoxin     Testing/Procedures To Be Arranged:  N/A  Follow-Up:   See [name] in about 6 weeks for a post hospital visit with EKG    Other Special Instructions:   N/A    If you need a refill on your cardiac medications before your next appointment, please call your pharmacy.   Call the [place] office at [phone] if you have any questions, problems or concerns.

## 2018-07-04 NOTE — Progress Notes (Signed)
CARDIOLOGY OFFICE NOTE  Date:  07/04/2018    Michele Green Date of Birth: November 04, 1941 Medical Record #[id number]  PCP:  [name], MD  Cardiologist:  Jennings Books    Chief Complaint  Patient presents with  . Atrial Fibrillation    Follow up visit - seen for [name]    History of Present Illness: Michele Green is a 77 y.o. female who presents today for a 1 month check. Seen for [name].   She has a history of atrial fib - she has elected to be managed with rate control and Xarelto. Other issues include HTN, & breast cancer.   Seen here in early July by Cecilie Kicks, NP - HR was not controlled.  Her lanoxin was added back to get better rate control. I then saw her a few weeks later - she was doing well with improvement in her HR. She had had her echo updated - this showed worsening MR - she has subsequently had TEE and cath and has plans for surgical intervention.   Comes in today. Here alone today. To see Dr. Roxy Manns this Friday for consultation. She is quite nervous. She does not feel well. She feels quite overwhelmed with her diagnosis and the upcoming need for surgical intervention. Still with some shortness of breath. Will have a little discomfort under her left breast at times. She does not feel like her heart is beating too fast. She is confused about her dose of Digoxin - has been taking a whole tablet and sometimes just a half. Needs refilled today. Has just taken prior to visit here today. No problem with her arm. Not taking Celebrex - typically takes just Tylenol. She is quite anxious to proceed on. Will need to go to rehab post op - she lives alone.   Past Medical History:  Diagnosis Date  . Breast cancer ([place])   . Breast cancer of upper-outer quadrant of left female breast ([place]) 02/02/2016  . Colitis   . Colon polyp 02/2012  . Dupuytren contracture    bilateral hands  . Dysrhythmia   . Family history of breast cancer   . Hypertension   . On  continuous oral anticoagulation 08/22/2015   Started on Xarelto 08/12/2015   . Osteopenia   . Persistent atrial fibrillation ([place]) 08/22/2015   Started late August or early September.   . Personal history of radiation therapy 2017  . Radiation 04/21/16-05/19/16   left breast 47.72 Gy, boosted to 10 Gy  . Wears glasses     Past Surgical History:  Procedure Laterality Date  . BREAST BIOPSY Left 01/28/2016  . BREAST LUMPECTOMY Left 02/23/2016  . BREAST LUMPECTOMY WITH RADIOACTIVE SEED LOCALIZATION Left 02/23/2016   Procedure: BREAST LUMPECTOMY WITH RADIOACTIVE SEED LOCALIZATION;  Surgeon: [name], MD;  Location: [facility];  Service: General;  Laterality: Left;  . CARDIOVERSION N/A 10/02/2015   Procedure: CARDIOVERSION;  Surgeon: Jerline Pain, MD;  Location: [facility] ENDOSCOPY;  Service: Cardiovascular;  Laterality: N/A;  . COLONOSCOPY    . DUPUYTREN CONTRACTURE RELEASE  2001   leftx2  . [place]   right  . DUPUYTREN CONTRACTURE RELEASE Right 05/02/2014   Procedure: EXCISION DUPUYTRENS RIGHT PALMAR/SMALL ;  Surgeon: Cammie Sickle, MD;  Location: [place];  Service: Orthopedics;  Laterality: Right;  . RIGHT/LEFT HEART CATH AND CORONARY ANGIOGRAPHY N/A 06/20/2018   Procedure: RIGHT/LEFT HEART CATH AND CORONARY ANGIOGRAPHY;  Surgeon:  [name], MD;  Location: [place] CV LAB;  Service: Cardiovascular;  Laterality: N/A;  . TEE WITHOUT CARDIOVERSION N/A 06/20/2018   Procedure: TRANSESOPHAGEAL ECHOCARDIOGRAM (TEE);  Surgeon: Pixie Casino, MD;  [facility] END[facility]COPY;  Service: Cardiovascular;  Laterality: N/A;  . TONSILLECTOMY  age 37     Medications: Current Meds  Medication Sig  . acetaminophen (TYLENOL) 325 MG tablet Take 650 mg by mouth every 6 (six) hours as needed for mild pain or headache.  . allopurinol (ZYLOPRIM) 100 MG tablet Take 200 mg by mouth daily.   . calcitRIOL (ROCALTROL) 0.25 MCG capsule Take 1  capsule (0.25 mcg total) daily by mouth.  . celecoxib (CELEBREX) 200 MG capsule Take 200 mg by mouth 2 (two) times daily as needed for mild pain.  . furosemide (LASIX) 20 MG tablet Take 1 tablet (20 mg total) by mouth daily.  [name] LIALDA 1.2 G EC tablet Take 4.8 g by mouth daily.   . metoprolol succinate (TOPROL-XL) 50 MG 24 hr tablet Take 25 mg by mouth daily. Take one half tablet by mouth (25 mg ) daily. Take with or immediately following a meal.  . potassium chloride SA (K-DUR,KLOR-CON) 20 MEQ tablet Take 1 tablet (20 mEq total) by mouth daily.  . simvastatin (ZOCOR) 20 MG tablet Take 20 mg by mouth daily.   . tamoxifen (NOLVADEX) 20 MG tablet Take 1 tablet (20 mg total) by mouth daily.  . Vitamin D, Ergocalciferol, 2000 units CAPS Take 4,000 Units by mouth daily.  Alveda Reasons 20 MG TABS tablet TAKE 1 TABLET BY MOUTH  DAILY WITH SUPPER  . [DISCONTINUED] digoxin (LANOXIN) 0.125 MG tablet Take 0.0625 mg by mouth daily. Take one half tablet (0.0625 mg ) daily     Allergies: No Known Allergies  Social History: The patient  reports that she quit smoking about 25 years ago. Her smoking use included cigarettes. She has a 20.00 pack-year smoking history. She has never used smokeless tobacco. She reports that she drinks about 3.0 standard drinks of alcohol per week. She reports that she does not use drugs.   Family History: The patient's family history includes Breast cancer in her sister; Breast cancer (age of onset: 55) in her mother; Cirrhosis in her mother; Heart attack in her father; Heart failure in her father; Kidney Stones in her sister.   Review of Systems: Please see the history of present illness.   Otherwise, the review of systems is positive for none.   All other systems are reviewed and negative.   Physical Exam: VS:  BP 118/70 (BP Location: Left Arm, Patient Position: Sitting, Cuff Size: Normal)   Pulse 69   Ht 5' 4"  (1.626 m)   Wt 126 lb 12.8 oz (57.5 kg)   SpO2 99% Comment: at  rest  BMI 21.77 kg/m  .  BMI Body mass index is 21.77 kg/m.  Wt Readings from Last 3 Encounters:  07/04/18 126 lb 12.8 oz (57.5 kg)  06/20/18 122 lb (55.3 kg)  06/14/18 122 lb 12.8 oz (55.7 kg)    General: Pleasant. Quite anxious but alert and in no acute distress.   HEENT: Normal.  Neck: Supple, no JVD, carotid bruits, or masses noted.  Cardiac: Irregular irregular rhythm. Her rate is ok. Loud systolic murmur noted.  Respiratory:  Lungs are clear to auscultation bilaterally with normal work of breathing.  GI: Soft and nontender.  MS: No deformity or atrophy. Gait and ROM intact.  Skin: Warm and dry. Color  is normal.  Neuro:  Strength and sensation are intact and no gross focal deficits noted.  Psych: Alert, appropriate and with normal affect.   LABORATORY DATA:  EKG:  EKG is not ordered today.  Lab Results  Component Value Date   WBC 4.7 06/14/2018   HGB 12.8 06/14/2018   HCT 36.2 06/14/2018   PLT 133 (L) 06/14/2018   GLUCOSE 97 06/14/2018   ALT 26 06/14/2018   AST 38 06/14/2018   NA 140 06/14/2018   K 4.3 06/14/2018   CL 103 06/14/2018   CREATININE 0.81 06/14/2018   BUN 13 06/14/2018   CO2 23 06/14/2018   TSH 1.200 05/30/2018   INR 1.19 08/24/2017     BNP (last 3 results) No results for input(s): BNP in the last 8760 hours.  ProBNP (last 3 results) Recent Labs    05/30/18 1115  PROBNP 5,122*     Other Studies Reviewed Today:  RIGHT/LEFT HEART CATH AND CORONARY ANGIOGRAPHY 05/2018  Conclusion    Severe mitral regurgitation with marked enlargement of the left atrium.  Mild pulmonary hypertension with mean pulmonary arterial pressure 28 mmHg.  Phasic pressure 45/16 mmHg  Three-vessel coronary calcification with significant greater than 80% distal circumflex, 90% first diagonal ostium, and 80 to 90% mid LAD.  Normal LV systolic function with EF 50 to 55%.  LVEDP 11 mmHg  Cardiac output 4.3 L/min/cardiac index 2.7  L/min/m  RECOMMENDATIONS:   Referred to Dr. Remus Loffler for consideration of surgical mitral valve repair left atrial appendage ligation, consideration of Maze procedure, and coronary revascularization.    TEE Study Conclusions 05/2018  - Left ventricle: The cavity size was normal. There was mild   concentric hypertrophy. Systolic function was normal. The   estimated ejection fraction was in the range of 60% to 65%. Wall   motion was normal; there were no regional wall motion   abnormalities. - Aorta: Mild atheromatous disease. - Mitral valve: Severe regurgitation. Degenerate leafelts with mild   posterior leaflet prolapse - no annular dilitation. Effective   regurgitant orifice (PISA): 0.82 cm^2. Regurgitant volume (PISA):   113 ml. - Left atrium: Massively dilated. - Pulmonary veins: No anomaly. Reversal of flow noted in the LUPV. - Right atrium: No evidence of thrombus in the atrial cavity or   appendage. - Atrial septum: No defect or patent foramen ovale was identified.  Impressions:  - Severe mitral regurgitation. No LAA thrombus. Massive LAE. LVEF   60-65%.  Echo Study Conclusions 05/2018  - Left ventricle: The cavity size was normal. Wall thickness was increased in a pattern of mild LVH. Systolic function was normal. The estimated ejection fraction was in the range of 60% to 65%. Wall motion was normal; there were no regional wall motion abnormalities. - Mitral valve: Mildly to moderately calcified annulus. Mildly thickened, mildly calcified leaflets . There was moderate to severe regurgitation. - Left atrium: The atrium was massively dilated. - Pulmonary arteries: Systolic pressure was mildly increased. PA peak pressure: 33 mm Hg (S).   Assessment/Plan:  1. 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 She is now s/p TEE and cath - now for surgical consultation later this  week. Will recheck lab today. See her back in about 6 weeks for post op check. She knows that she is progressive symptoms of shortness of breath/chest pain - to call EMS.   2. Permanent AF - she is back on Lanoxin - dose has not  been followed - we discussed this at length and she understands - her rate looks better.   3. Chronic anticoagulation - no adverse effects noted. Back on Xarelto.   4. HTN - BP is ok - no changes made  Current medicines are reviewed with the patient today.  The patient does not have concerns regarding medicines other than what has been noted above.  The following changes have been made:  See above.  Labs/ tests ordered today include:    Orders Placed This Encounter  Procedures  . Basic metabolic panel  . CBC     Disposition:   FU with [name] in about 6 weeks for post hospital visit.   Patient is agreeable to this plan and will call if any problems develop in the interim.   SignedTruitt Merle, NP  07/04/2018 10:15 AM  [facility] [address] [place], [place]  [address] Phone: [phone] Fax: [phone]

## 2018-07-07 ENCOUNTER — Other Ambulatory Visit: Payer: Self-pay

## 2018-07-07 ENCOUNTER — Encounter: Payer: Self-pay | Admitting: Thoracic Surgery (Cardiothoracic Vascular Surgery)

## 2018-07-07 ENCOUNTER — Institutional Professional Consult (permissible substitution): Payer: Medicare Other | Admitting: Thoracic Surgery (Cardiothoracic Vascular Surgery)

## 2018-07-07 VITALS — BP 130/60 | HR 60 | Resp 18 | Ht 64.0 in | Wt 126.0 lb

## 2018-07-07 DIAGNOSIS — I25119 Atherosclerotic heart disease of native coronary artery with unspecified angina pectoris: Secondary | ICD-10-CM | POA: Diagnosis not present

## 2018-07-07 DIAGNOSIS — I481 Persistent atrial fibrillation: Secondary | ICD-10-CM | POA: Diagnosis not present

## 2018-07-07 DIAGNOSIS — I4819 Other persistent atrial fibrillation: Secondary | ICD-10-CM

## 2018-07-07 DIAGNOSIS — I340 Nonrheumatic mitral (valve) insufficiency: Secondary | ICD-10-CM | POA: Diagnosis not present

## 2018-07-07 DIAGNOSIS — I5032 Chronic diastolic (congestive) heart failure: Secondary | ICD-10-CM | POA: Diagnosis not present

## 2018-07-07 DIAGNOSIS — I2510 Atherosclerotic heart disease of native coronary artery without angina pectoris: Secondary | ICD-10-CM

## 2018-07-07 NOTE — Progress Notes (Signed)
[address]       [place], [address]             [id number]     CARDIOTHORACIC SURGERY CONSULTATION REPORT  Referring Provider is [name], MD PCP is [name], MD  Chief Complaint  Patient presents with  . Mitral Regurgitation    new patient consultation, cath 06/20/18  . Coronary Artery Disease  . Atrial Fibrillation    HPI:  Patient is a 77 year old female with history of mitral regurgitation, long-standing persistent atrial fibrillation on oral anticoagulation, hypertension, chronic diastolic congestive heart failure, previous spine surgery for management of compression fracture of the lumbar spine, and remote history of breast cancer who has been referred for surgical consultation to discuss treatment options for management of severe symptomatic primary mitral regurgitation, coronary artery disease, and persistent atrial fibrillation.  Patient states that she has known of presence of a heart murmur for at least 4 years.  She describes chronic symptoms of exertional shortness of breath and fatigue for a similar amount of time.  In 2016 she was diagnosed with persistent atrial fibrillation and she has been cared for by [name] ever since.  She underwent attempted DC cardioversion but failed to maintain sinus rhythm.  She has been treated with rate control and long-term anticoagulation using warfarin.  Previous echocardiograms have documented the presence of normal left ventricular systolic function with what was felt to be mild mitral regurgitation.  She underwent spine surgery for lumbar compression fracture with complications, ultimately needing redo spine surgery to remove the previously placed screws.  Her physical recovery following this was slow.  During the same time her husband was ill with cancer and ultimately passed away.  Since that time she has had progressing symptoms of exertional shortness of breath without chest pain or chest  tightness.  Symptoms progressed to the point where she developed shortness of breath with very mild activity, orthopnea, and lower extremity edema a few months ago.  She was started on oral Lasix and symptoms have improved, but she continues to get short of breath and fatigued with moderate level activity.  This limits her daily activities considerably.  She was seen in follow-up recently and transthoracic echocardiogram revealed normal left ventricular systolic function with what was felt to be at least moderate if not severe mitral regurgitation.  She underwent TEE and diagnostic cardiac catheterization on June 20, 2018.  TEE revealed normal left ventricular systolic function with severe mitral regurgitation and severe left atrial enlargement.  Catheterization revealed severe multivessel coronary artery disease with mild pulmonary hypertension.  Cardiothoracic surgical consultation was requested.  The patient is recently widowed and lives alone in [place].  She has 1 adult son who lives in [place].  She lives a fairly sedentary lifestyle.  She states that she is limited primarily by exertional shortness of breath and fatigue.  She does not have problems walking.  She notes that her recovery following her spine surgery was very slow, but she now has only very mild pain in her back.  She denies any history of exertional chest pain or chest tightness.  She gets short of breath with moderate level activity.  She denies resting shortness of breath, PND, orthopnea, dizzy spells, or syncope.  She has had some orthopnea and lower extremity edema previously, but this has improved on oral diuretics.  She has some occasional tachypalpitations.  She has been chronically anticoagulated using Xarelto ever since 2016.  She has not had any bleeding complications nor history of TIA or stroke.  Past Medical History:  Diagnosis Date  . Breast cancer ([place])   . Breast cancer of upper-outer quadrant of left female breast  ([place]) 02/02/2016  . Colitis   . Colon polyp 02/2012  . Coronary artery disease involving native coronary artery of native heart without angina pectoris 06/20/2018   multivessel CAD by catheterization  . Dupuytren contracture    bilateral hands  . Dysrhythmia   . Family history of breast cancer   . Hypertension   . Mitral regurgitation 11/07/2013  . On continuous oral anticoagulation 08/22/2015   Started on Xarelto 08/12/2015   . Osteopenia   . Persistent atrial fibrillation ([place]) 08/22/2015   Started late August or early September 2016  . Radiation Therapy 04/21/16-05/19/16   left breast 47.72 Gy, boosted to 10 Gy  . Wears glasses     Past Surgical History:  Procedure Laterality Date  . BREAST BIOPSY Left 01/28/2016  . BREAST LUMPECTOMY Left 02/23/2016  . BREAST LUMPECTOMY WITH RADIOACTIVE SEED LOCALIZATION Left 02/23/2016   Procedure: BREAST LUMPECTOMY WITH RADIOACTIVE SEED LOCALIZATION;  Surgeon: [name], MD;  Location: [facility];  Service: General;  Laterality: Left;  . CARDIOVERSION N/A 10/02/2015   Procedure: CARDIOVERSION;  Surgeon: Jerline Pain, MD;  Location: [facility] ENDOSCOPY;  Service: Cardiovascular;  Laterality: N/A;  . COLONOSCOPY    . DUPUYTREN CONTRACTURE RELEASE  2001   leftx2  . [place]   right  . DUPUYTREN CONTRACTURE RELEASE Right 05/02/2014   Procedure: EXCISION DUPUYTRENS RIGHT PALMAR/SMALL ;  Surgeon: Cammie Sickle, MD;  Location: [facility];  Service: Orthopedics;  Laterality: Right;  . RIGHT/LEFT HEART CATH AND CORONARY ANGIOGRAPHY N/A 06/20/2018   Procedure: RIGHT/LEFT HEART CATH AND CORONARY ANGIOGRAPHY;  Surgeon: [name], MD;  Location: [facility] CV LAB;  Service: Cardiovascular;  Laterality: N/A;  . TEE WITHOUT CARDIOVERSION N/A 06/20/2018   Procedure: TRANSESOPHAGEAL ECHOCARDIOGRAM (TEE);  Surgeon: Pixie Casino, MD;  Location: [facility] ENDOSCOPY;  Service: Cardiovascular;   Laterality: N/A;  . TONSILLECTOMY  age 65    Family History  Problem Relation Age of Onset  . Cirrhosis Mother   . Breast cancer Mother 60  . Heart failure Father   . Heart attack Father   . Breast cancer Sister        early 13's  . Kidney Stones Sister        loss of kidney due to stones  . Osteoporosis Neg Hx     Social History   Socioeconomic History  . Marital status: Married    Spouse name: Not on file  . Number of children: 1  . Years of education: Not on file  . Highest education level: Not on file  Occupational History  . Not on file  Social Needs  . Financial resource strain: Not on file  . Food insecurity:    Worry: Not on file    Inability: Not on file  . Transportation needs:    Medical: Not on file    Non-medical: Not on file  Tobacco Use  . Smoking status: Former Smoker    Packs/day: 1.00    Years: 20.00    Pack years: 20.00    Types: Cigarettes    Last attempt to quit: 11/22/1992    Years since quitting: 25.6  . Smokeless tobacco: Never Used  Substance and Sexual Activity  . Alcohol  use: Yes    Alcohol/week: 3.0 standard drinks    Types: 3 Standard drinks or equivalent per week    Comment: social  . Drug use: No  . Sexual activity: Never    Partners: Male    Birth control/protection: Post-menopausal  Lifestyle  . Physical activity:    Days per week: Not on file    Minutes per session: Not on file  . Stress: Not on file  Relationships  . Social connections:    Talks on phone: Not on file    Gets together: Not on file    Attends religious service: Not on file    Active member of club or organization: Not on file    Attends meetings of clubs or organizations: Not on file    Relationship status: Not on file  . Intimate partner violence:    Fear of current or ex partner: Not on file    Emotionally abused: Not on file    Physically abused: Not on file    Forced sexual activity: Not on file  Other Topics Concern  . Not on file  Social  History Narrative  . Not on file    Current Outpatient Medications  Medication Sig Dispense Refill  . acetaminophen (TYLENOL) 325 MG tablet Take 650 mg by mouth every 6 (six) hours as needed for mild pain or headache.    . allopurinol (ZYLOPRIM) 100 MG tablet Take 200 mg by mouth daily.     . calcitRIOL (ROCALTROL) 0.25 MCG capsule Take 1 capsule (0.25 mcg total) daily by mouth. 30 capsule 11  . digoxin (LANOXIN) 0.125 MG tablet Take 0.5 tablets (0.0625 mg total) by mouth daily. Take one half tablet (0.0625 mg ) daily 45 tablet 3  . furosemide (LASIX) 20 MG tablet Take 1 tablet (20 mg total) by mouth daily. 90 tablet 3  . LIALDA 1.2 G EC tablet Take 4.8 g by mouth daily.     . metoprolol succinate (TOPROL-XL) 50 MG 24 hr tablet Take 25 mg by mouth daily. Take one half tablet by mouth (25 mg ) daily. Take with or immediately following a meal.    . potassium chloride SA (K-DUR,KLOR-CON) 20 MEQ tablet Take 1 tablet (20 mEq total) by mouth daily. 90 tablet 3  . simvastatin (ZOCOR) 20 MG tablet Take 20 mg by mouth daily.     . tamoxifen (NOLVADEX) 20 MG tablet Take 1 tablet (20 mg total) by mouth daily. 30 tablet 3  . Vitamin D, Ergocalciferol, 2000 units CAPS Take 4,000 Units by mouth daily. 30 capsule 11  . XARELTO 20 MG TABS tablet TAKE 1 TABLET BY MOUTH  DAILY WITH SUPPER 90 tablet 3   No current facility-administered medications for this visit.     No Known Allergies    Review of Systems:   General:  normal appetite, decreased energy, no weight gain, no weight loss, no fever  Cardiac:  no chest pain with exertion, no chest pain at rest, +SOB with exertion, no resting SOB, no PND, + orthopnea, + palpitations, + arrhythmia, + atrial fibrillation, + LE edema, no dizzy spells, no syncope  Respiratory:  + shortness of breath, no home oxygen, no productive cough, no dry cough, no bronchitis, no wheezing, no hemoptysis, no asthma, no pain with inspiration or cough, no sleep apnea, no CPAP at  night  GI:   no difficulty swallowing, no reflux, no frequent heartburn, no hiatal hernia, no abdominal pain, no constipation, no diarrhea, no hematochezia, no  hematemesis, no melena  GU:   no dysuria,  no frequency, no urinary tract infection, no hematuria, no kidney stones, no kidney disease  Vascular:  no pain suggestive of claudication, no pain in feet, no leg cramps, no varicose veins, no DVT, no non-healing foot ulcer  Neuro:   no stroke, no TIA's, no seizures, no headaches, no temporary blindness one eye,  no slurred speech, no peripheral neuropathy, no chronic pain, no instability of gait, no memory/cognitive dysfunction  Musculoskeletal: + arthritis, no joint swelling, no myalgias, no difficulty walking, normal mobility   Skin:   no rash, no itching, no skin infections, no pressure sores or ulcerations  Psych:   no anxiety, + depression, no nervousness, no unusual recent stress  Eyes:   no blurry vision, no floaters, no recent vision changes, + wears glasses or contacts  ENT:   no hearing loss, no loose or painful teeth, no dentures, last saw dentist January 2019  Hematologic:  + easy bruising, no abnormal bleeding, no clotting disorder, no frequent epistaxis  Endocrine:  no diabetes, does not check CBG's at home     Physical Exam:   BP 130/60 (BP Location: Right Arm, Patient Position: Sitting, Cuff Size: Normal)   Pulse 60   Resp 18   Ht 5' 4"  (1.626 m)   Wt 126 lb (57.2 kg)   SpO2 96% Comment: RA  BMI 21.63 kg/m   General:    well-appearing  HEENT:  Unremarkable   Neck:   no JVD, no bruits, no adenopathy   Chest:   clear to auscultation, symmetrical breath sounds, no wheezes, no rhonchi   CV:   Irregular rate and rhythm, prominent grade III/VI holosystolic murmur   Abdomen:  soft, non-tender, no masses   Extremities:  warm, well-perfused, pulses diminished but palpable, no LE edema  Rectal/GU  Deferred  Neuro:   Grossly non-focal and symmetrical throughout  Skin:   Clean  and dry, no rashes, no breakdown   Diagnostic Tests:  Transthoracic Echocardiography  Patient:  Chinenye, Katzenberger MR #:    [id number] Study Date: 01/13/2015 Gender:   F Age:    40 Height:   160 cm Weight:   69.1 kg BSA:    1.77 m^2 Pt. Status: Room:  ATTENDING  Jenkins Rouge, M.D. SONOGRAPHER Cindy Hazy, RDCS ORDERING   Blenda Bridegroom, Lynnell Dike REFERRING  Smith I[place], [place], Outpatient  cc:  ------------------------------------------------------------------- LV EF: 60% -  65%  ------------------------------------------------------------------- Indications:   I34.0 Mitral Regurgitation.[name]  ------------------------------------------------------------------- History:  PMH: Acquired from the patient and from the patient&'s chart. Risk factors: Former tobacco use. Hypertension. Dyslipidemia.  ------------------------------------------------------------------- Study Conclusions  - Left ventricle: The cavity size was normal. Wall thickness was increased in a pattern of mild LVH. Systolic function was normal. The estimated ejection fraction was in the range of 60% to 65%. - Mitral valve: MAC Possible prolapse of posterior leaflet. MR appears mild and directed anteriorly Consider TEE if clinically indicated - Left atrium: The atrium was moderately dilated. - Atrial septum: No defect or patent foramen ovale was identified.  Transthoracic echocardiography. M-mode, complete 2D, spectral Doppler, and color Doppler. Birthdate: Patient birthdate: February 26, 1941. Age: Patient is 77 yr old. Sex: Gender: female. BMI: 27 kg/m^2. Blood pressure:   124/68 Patient status: Outpatient. Study date: Study date: 01/13/2015. Study time: 01:01 PM. Location: [facility]  -------------------------------------------------------------------  ------------------------------------------------------------------- Left  ventricle: The cavity size was normal. Wall thickness was increased in a  pattern of mild LVH. Systolic function was normal. The estimated ejection fraction was in the range of 60% to 65%.  ------------------------------------------------------------------- Aortic valve:  Mildly calcified leaflets. Doppler:  There was no stenosis.  ------------------------------------------------------------------- Aorta: The aorta was normal, not dilated, and non-diseased.  ------------------------------------------------------------------- Mitral valve: MAC Possible prolapse of posterior leaflet. MR appears mild and directed anteriorly Consider TEE if clinically indicated  ------------------------------------------------------------------- Left atrium: The atrium was moderately dilated.  ------------------------------------------------------------------- Atrial septum: No defect or patent foramen ovale was identified.  ------------------------------------------------------------------- Right ventricle: The cavity size was normal. Wall thickness was normal. Systolic function was normal.  ------------------------------------------------------------------- Pulmonic valve:  Structurally normal valve.  Cusp separation was normal. Doppler: Transvalvular velocity was within the normal range. There was trivial regurgitation.  ------------------------------------------------------------------- Tricuspid valve:  Doppler: There was mild regurgitation.  ------------------------------------------------------------------- Right atrium: The atrium was normal in size.  ------------------------------------------------------------------- Pericardium: The pericardium was normal in appearance.  ------------------------------------------------------------------- Post procedure conclusions Ascending Aorta:  - The aorta was normal, not dilated, and  non-diseased.  ------------------------------------------------------------------- Prepared and Electronically Authenticated by  Jenkins Rouge, M.D. 2016-02-22T14:00:16   Transthoracic Echocardiography  Patient:    Kashina, Mecum MR #:       [id number] Study Date: 06/08/2018 Gender:     F Age:        46 Height:     162.6 cm Weight:     55.2 kg BSA:        1.58 m^2 Pt. Status: Room:   ATTENDING    Mertie Moores, M.D.  ORDERING     Isaiah Serge  REFERRING    Cecilie Kicks R  SONOGRAPHER  [place], Outpatient  cc:  ------------------------------------------------------------------- LV EF: 60% -   65%  ------------------------------------------------------------------- Indications:      I48.1 Atrial fibrillation.  ------------------------------------------------------------------- History:   PMH:  History of left breast cancer. Radiation therapy. Risk factors:  Hypertension.  ------------------------------------------------------------------- Study Conclusions  - Left ventricle: The cavity size was normal. Wall thickness was   increased in a pattern of mild LVH. Systolic function was normal.   The estimated ejection fraction was in the range of 60% to 65%.   Wall motion was normal; there were no regional wall motion   abnormalities. - Mitral valve: Mildly to moderately calcified annulus. Mildly   thickened, mildly calcified leaflets . There was moderate to   severe regurgitation. - Left atrium: The atrium was massively dilated. - Pulmonary arteries: Systolic pressure was mildly increased. PA   peak pressure: 33 mm Hg (S).  ------------------------------------------------------------------- Study data:  Comparison was made to the study of 01/19/2016.  Study status:  Routine.  Procedure:  The patient reported no pain pre or post test. Transthoracic echocardiography. Image quality was adequate.  Study completion:  There were no  complications. Transthoracic echocardiography.  M-mode, complete 2D, spectral Doppler, and color Doppler.  Birthdate:  Patient birthdate: 07-Nov-1941.  Age:  Patient is 77 yr old.  Sex:  Gender: female. BMI: 20.9 kg/m^2.  Blood pressure:     91/49  Patient status: Outpatient.  Study date:  Study date: 06/08/2018. Study time: 03:14 PM.  Location:  [facility]  -------------------------------------------------------------------  ------------------------------------------------------------------- Left ventricle:  The cavity size was normal. Wall thickness was increased in a pattern of mild LVH. Systolic function was normal. The estimated ejection fraction was in the range of 60% to 65%. Wall motion was normal; there were no regional wall motion abnormalities. The study was not technically  sufficient to allow evaluation of LV diastolic dysfunction due to atrial fibrillation.   ------------------------------------------------------------------- Aortic valve:   Mildly thickened leaflets.  Doppler:   There was no stenosis.   There was no significant regurgitation.  ------------------------------------------------------------------- Aorta:  Aortic root: The aortic root was normal in size. Ascending aorta: The ascending aorta was normal in size.  ------------------------------------------------------------------- Mitral valve:   Mildly to moderately calcified annulus. Mildly thickened, mildly calcified leaflets .  Doppler:  There was moderate to severe regurgitation.  ------------------------------------------------------------------- Left atrium:  The atrium was massively dilated.  ------------------------------------------------------------------- Right ventricle:  The cavity size was normal. Systolic function was normal.  ------------------------------------------------------------------- Pulmonic valve:    The valve appears to be grossly normal. Doppler:  There was  trivial regurgitation.  ------------------------------------------------------------------- Tricuspid valve:   The valve appears to be grossly normal. Doppler:  There was mild regurgitation.  ------------------------------------------------------------------- Pulmonary artery:   Systolic pressure was mildly increased.  ------------------------------------------------------------------- Right atrium:  The atrium was normal in size.  ------------------------------------------------------------------- Pericardium:  There was no pericardial effusion.  ------------------------------------------------------------------- Measurements   Left ventricle                         Value        Reference  LV ID, ED, PLAX chordal                45.3  mm     43 - 52  LV ID, ES, PLAX chordal                30.6  mm     23 - 38  LV fx shortening, PLAX chordal         32    %      >=29  LV PW thickness, ED                    12    mm     ---------  IVS/LV PW ratio, ED                    1.12         <=1.3    Ventricular septum                     Value        Reference  IVS thickness, ED                      13.4  mm     ---------    LVOT                                   Value        Reference  LVOT ID, S                             19    mm     ---------  LVOT area                              2.84  cm^2   ---------    Aorta  Value        Reference  Aortic root ID, ED                     29    mm     ---------    Left atrium                            Value        Reference  LA ID, A-P, ES                         53    mm     ---------  LA ID/bsa, A-P                 (H)     3.36  cm/m^2 <=2.2  LA volume, S                           178   ml     ---------  LA volume/bsa, S                       112.7 ml/m^2 ---------  LA volume, ES, 1-p A4C                 174   ml     ---------  LA volume/bsa, ES, 1-p A4C             110.2 ml/m^2 ---------  LA volume,  ES, 1-p A2C                 180   ml     ---------  LA volume/bsa, ES, 1-p A2C             114   ml/m^2 ---------    Mitral valve                           Value        Reference  Aliasing velocity, MR PISA             38.5  cm/s   ---------  Mitral regurg PISA radius              6     mm     ---------  Mitral regurg VTI, PISA                127   cm     ---------  Mitral ERO, PISA                       0.2   cm^2   ---------  Mitral regurg volume, PISA             25    ml     ---------    Pulmonary arteries                     Value        Reference  PA pressure, S, DP             (H)     33    mm Hg  <=30    Tricuspid valve  Value        Reference  Tricuspid regurg peak velocity         248   cm/s   ---------  Tricuspid peak RV-RA gradient          25    mm Hg  ---------    Systemic veins                         Value        Reference  Estimated CVP                          8     mm Hg  ---------    Right ventricle                        Value        Reference  RV pressure, S, DP             (H)     33    mm Hg  <=30  Legend: (L)  and  (H)  mark values outside specified reference range.  ------------------------------------------------------------------- Prepared and Electronically Authenticated by  Mertie Moores, M.D. 2019-07-18T17:00:56   Transesophageal Echocardiography  Patient:    Sharmon, Cheramie MR #:       [id number] Study Date: 06/20/2018 Gender:     F Age:        19 Height:     162.6 cm Weight:     55.3 kg BSA:        1.58 m^2 Pt. Status: Room:   Toni Arthurs  ADMITTING    Lyman Bishop MD  [place] MD  PERFORMING   Lyman Bishop MD  SONOGRAPHER  Johny Chess, RDCS, CCT  cc:  ------------------------------------------------------------------- LV EF: 60% -   65%  ------------------------------------------------------------------- Indications:      Mitral regurgitation  424.0.  ------------------------------------------------------------------- Study Conclusions  - Left ventricle: The cavity size was normal. There was mild   concentric hypertrophy. Systolic function was normal. The   estimated ejection fraction was in the range of 60% to 65%. Wall   motion was normal; there were no regional wall motion   abnormalities. - Aorta: Mild atheromatous disease. - Mitral valve: Severe regurgitation. Degenerate leafelts with mild   posterior leaflet prolapse - no annular dilitation. Effective   regurgitant orifice (PISA): 0.82 cm^2. Regurgitant volume (PISA):   113 ml. - Left atrium: Massively dilated. - Pulmonary veins: No anomaly. Reversal of flow noted in the LUPV. - Right atrium: No evidence of thrombus in the atrial cavity or   appendage. - Atrial septum: No defect or patent foramen ovale was identified.  Impressions:  - Severe mitral regurgitation. No LAA thrombus. Massive LAE. LVEF   60-65%.  ------------------------------------------------------------------- Study data:   Study status:  Routine.  Consent:  The risks, benefits, and alternatives to the procedure were explained to the patient and informed consent was obtained.  Procedure:  Initial setup. The patient was brought to the laboratory. Surface ECG leads were monitored. Sedation. Conscious sedation was administered by cardiology staff. Transesophageal echocardiography. An adult multiplane transesophageal probe was inserted by the attending cardiologistwithout difficulty. Image quality was adequate.  Study completion:  The patient tolerated the procedure well. There were no complications.  Administered medications:   Midazolam, 67m, IV. Fentanyl, 22m, IV.  Diagnostic transesophageal echocardiography.  2D and color Doppler.  Birthdate:  Patient birthdate: [birth year].  Age:  Patient is 77 yr old.  Sex:  Gender: female.    BMI: 20.9 kg/m^2.  Blood pressure:     122/88   Patient status:  Outpatient.  Study date:  Study date: 06/20/2018. Study time: 08:14 AM.  Location:  Endoscopy.  -------------------------------------------------------------------  ------------------------------------------------------------------- Left ventricle:  The cavity size was normal. There was mild concentric hypertrophy. Systolic function was normal. The estimated ejection fraction was in the range of 60% to 65%. Wall motion was normal; there were no regional wall motion abnormalities.  ------------------------------------------------------------------- Aortic valve:   Trileaflet.  Doppler:  There was no regurgitation.   ------------------------------------------------------------------- Aorta:  Mild atheromatous disease.  ------------------------------------------------------------------- Mitral valve:  Severe regurgitation. Degenerate leafelts with mild posterior leaflet prolapse - no annular dilitation.  ------------------------------------------------------------------- Left atrium:  Massively dilated.  ------------------------------------------------------------------- Atrial septum:  No defect or patent foramen ovale was identified.   ------------------------------------------------------------------- Pulmonary veins:  No anomaly. Reversal of flow noted in the LUPV.   ------------------------------------------------------------------- Right ventricle:  The cavity size was normal. Wall thickness was normal. Systolic function was normal.  ------------------------------------------------------------------- Pulmonic valve:    Doppler:  There was trivial regurgitation.  ------------------------------------------------------------------- Tricuspid valve:   Doppler:  There was mild regurgitation.  ------------------------------------------------------------------- Pulmonary artery:   The main pulmonary artery was  normal-sized.  ------------------------------------------------------------------- Right atrium:  The atrium was normal in size.  No evidence of thrombus in the atrial cavity or appendage.  ------------------------------------------------------------------- Pericardium:  There was no pericardial effusion.   ------------------------------------------------------------------- Post procedure conclusions Ascending Aorta:  - Mild atheromatous disease.  ------------------------------------------------------------------- Measurements   Mitral valve                              Value  Mitral maximal regurg velocity, PISA      568   cm/s  Mitral regurg VTI, PISA                   138   cm  Mitral ERO, PISA                          0.82  cm^2  Mitral regurg volume, PISA                113   ml  Legend: (L)  and  (H)  mark values outside specified reference range.  ------------------------------------------------------------------- Prepared and Electronically Authenticated by  Lyman Bishop MD 2019-07-30T08:38:21     RIGHT/LEFT HEART CATH AND CORONARY ANGIOGRAPHY  Conclusion    Severe mitral regurgitation with marked enlargement of the left atrium.  Mild pulmonary hypertension with mean pulmonary arterial pressure 28 mmHg.  Phasic pressure 45/16 mmHg  Three-vessel coronary calcification with significant greater than 80% distal circumflex, 90% first diagonal ostium, and 80 to 90% mid LAD.  Normal LV systolic function with EF 50 to 55%.  LVEDP 11 mmHg  Cardiac output 4.3 L/min/cardiac index 2.7 L/min/m  RECOMMENDATIONS:   Referred to Dr. Remus Loffler for consideration of surgical mitral valve repair left atrial appendage ligation, consideration of Maze procedure, and coronary revascularization.    Recommend to resume Rivaroxaban, at currently prescribed dose and frequency, on 06/20/18 at 9:30 PM.  Concurrent antiplatelet therapy not recommended.  Indications    Acute on chronic combined systolic and diastolic CHF (congestive heart failure) (HCC) [I50.43 (ICD-10-CM)]  Non-rheumatic mitral  regurgitation [I34.0 (ICD-10-CM)]  CAD in native artery [I25.10 (ICD-10-CM)]  Procedural Details/Technique   Technical Details The right radial area was sterilely prepped and draped. Intravenous sedation with Versed and fentanyl was administered. 1% Xylocaine was infiltrated to achieve local analgesia. Using real-time vascular ultrasound, a double wall stick with an angiocath was utilized to obtain intra-arterial access. A VUS image was saved for the permanent record.The modified Seldinger technique was used to place a 53F " Slender" sheath in the right radial artery. Weight based heparin was administered. Coronary angiography was done using 5 F catheters. Right coronary angiography was performed with a JR4. Left ventricular hemodymic recordings and angiography was done using the JR 4 catheter and hand injection. Left coronary angiography was performed with a JL 3.5 cm.  Right heart catheterization was performed by exchanging a previously placed antecubital IV angio-cath for a 5 French Slender sheath. 1% Xylocaine was used to locally nesthetize the area around the IV site. The IV catheter was wired using an .018 guidewire. The modified Seldinger technique was used to place the 5 [place] sheath. Double glove technique was used to enhance sterility. After sheath insertion, right heart cath was performed using a 5 French balloon tipped catheter and fluoroscopic guidance. Pressures were recorded in each chamber and in the pulmonary capillary wedge position.. The main pulmonary artery O2 saturation was sampled.   Hemostasis was achieved using a pneumatic band.  During this procedure the patient is administered a total of Versed 1 mg and Fentanyl 25 mg to achieve and maintain moderate conscious sedation. The patient's heart rate, blood pressure, and oxygen saturation are monitored  continuously during the procedure. The period of conscious sedation is 36 minutes, of which I was present face-to-face 100% of this time.   Estimated blood loss <50 mL.  During this procedure the patient was administered the following to achieve and maintain moderate conscious sedation: Versed 1 mg, Fentanyl 25 mcg, while the patient's heart rate, blood pressure, and oxygen saturation were continuously monitored. The period of conscious sedation was 36 minutes, of which I was present face-to-face 100% of this time.  Coronary Findings   Diagnostic  Dominance: Right  Left Anterior Descending  Prox LAD-1 lesion 65% stenosed  Prox LAD-1 lesion is 65% stenosed.  Prox LAD-2 lesion 90% stenosed  Prox LAD-2 lesion is 90% stenosed.  First Diagonal Branch  Ost 1st Diag lesion 90% stenosed  Ost 1st Diag lesion is 90% stenosed.  Left Circumflex  Mid Cx lesion 85% stenosed  Mid Cx lesion is 85% stenosed.  First Obtuse Marginal Branch  Vessel is small in size.  Second Obtuse Marginal Branch  Vessel is large in size.  Ost 2nd Mrg to 2nd Mrg lesion 50% stenosed  Ost 2nd Mrg to 2nd Mrg lesion is 50% stenosed.  Right Coronary Artery  Ost RCA to Mid RCA lesion 30% stenosed  Ost RCA to Mid RCA lesion is 30% stenosed.  Intervention   No interventions have been documented.  Right Heart   Right Heart Pressures Hemodynamic findings consistent with mild pulmonary hypertension and mitral valve regurgitation. LV EDP is normal.  Wall Motion   Resting               Left Heart   Left Ventricle The left ventricle is dilated. The left ventricular systolic function is normal. LV end diastolic pressure is normal. The left ventricular ejection fraction is 50-55% by visual estimate. No regional wall motion abnormalities. There is moderate to severe mitral regurgitation.  Mitral Valve There is severe (4+) mitral regurgitation. V wave spiking to 42 mmHg. Mean pulmonary wedge pressure 21 mmHg.  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  She describes stable symptoms of exertional shortness of breath and fatigue consistent with chronic diastolic congestive heart failure, [place] Heart Association functional class IIb.  Symptoms reportedly had been worse previously but have improved somewhat on oral diuretic therapy.  I have personally reviewed the patient's recent echocardiograms and diagnostic cardiac catheterization.  The patient has severe mitral regurgitation.  The functional anatomy of the mitral valve was not well elucidated on recent TEE.  There is definitely annular dilatation.  For the most part there is seems to be normal leaflet mobility although in some views there is suggestion of a partially calcified and/or restricted segment of the posterior leaflet.  There is no clearly convincing evidence for prolapse.  Findings are not suggestive of rheumatic disease.  There is severe left atrial enlargement.  Left ventricular function appears normal.  Diagnostic cardiac catheterization reveals multivessel coronary artery disease with significant stenosis of the left anterior descending coronary artery and the left circumflex coronary artery.  Pulmonary artery pressures were mildly elevated.  I agree the patient needs surgical intervention.  I suspect her mitral valve should be repairable, although the functional anatomy of the valve was not well identified on recent TEE.  Given the severity of coronary artery disease she should undergo coronary artery bypass grafting at the time of surgery.  She might benefit from concomitant Maze procedure.  Overall risks associated with surgery should be acceptably low although will be slightly elevated because of the  patient's age and somewhat limited physical activity.   Plan:  The patient and her family were counseled at length regarding the indications, risks and potential benefits of mitral valve repair.  The rationale for elective surgery has been explained, including a comparison between surgery and continued medical therapy with close follow-up.  The likelihood of successful and durable valve repair has been discussed with particular reference to the findings of their recent echocardiogram.  Based upon these findings and previous experience, I have quoted them a greater than 90 percent likelihood of successful valve repair.  In the unlikely event that their valve cannot be successfully repaired, we discussed the possibility of replacing the mitral valve using a mechanical prosthesis with the attendant need for long-term anticoagulation versus the alternative of replacing it using a bioprosthetic tissue valve with its potential for late structural valve deterioration and failure, depending upon the patient's longevity.  The patient specifically requests that if the mitral valve must be replaced that it be done using a bioprosthetic tissue valve.   The need for concomitant coronary artery bypass grafting was discussed as well as implications regarding the surgical approach and long-term prognosis.  The relative risks and benefits of performing a maze procedure at the time of their surgery was discussed at length, including the expected likelihood of long term freedom from recurrent symptomatic atrial fibrillation and/or atrial flutter.  Expectations for her postoperative convalescence have been discussed.  She understands that because she lives alone she may need temporary placement in a skilled nursing facility for rehabilitation following surgery.  The patient hopes to proceed with surgery as soon as practical.  We tentatively plan to proceed with surgery on August 10, 2018.  Prior to surgery she will undergo CT  angiography, and she will return to our office for follow-up on August 07, 2018.  She has been instructed to stop taking Xarelto 7 days prior to surgery.  The patient understands and accepts all potential risks of surgery including but not limited to risk of death, stroke or other neurologic complication, myocardial infarction, congestive heart failure, respiratory failure, renal failure, bleeding requiring transfusion and/or reexploration, arrhythmia, infection or other wound complications, pneumonia, pleural and/or pericardial effusion, pulmonary embolus, aortic dissection or other major vascular complication, or delayed complications related to valve repair or replacement including but not limited to structural valve deterioration and failure, thrombosis, embolization, endocarditis, or paravalvular leak.  All of their questions have been answered.    I spent in excess of 90 minutes during the conduct of this office consultation and >50% of this time involved direct face-to-face encounter with the patient for counseling and/or coordination of their care.    Valentina Gu. Roxy Manns, MD 07/07/2018 4:18 PM

## 2018-07-07 NOTE — Patient Instructions (Addendum)
Stop taking Xarelto on 08/03/2018 - 7 days prior to surgery  Continue taking all other medications without change through the day before surgery.  Have nothing to eat or drink after midnight the night before surgery.  On the morning of surgery take only Toprol XL and Zyloprim with a sip of water.

## 2018-07-10 ENCOUNTER — Other Ambulatory Visit: Payer: Self-pay | Admitting: Thoracic Surgery (Cardiothoracic Vascular Surgery)

## 2018-07-10 ENCOUNTER — Other Ambulatory Visit: Payer: Self-pay | Admitting: *Deleted

## 2018-07-10 ENCOUNTER — Ambulatory Visit: Payer: Medicare Other | Admitting: Endocrinology

## 2018-07-10 ENCOUNTER — Encounter: Payer: Self-pay | Admitting: *Deleted

## 2018-07-10 DIAGNOSIS — I71019 Dissection of thoracic aorta, unspecified: Secondary | ICD-10-CM

## 2018-07-10 DIAGNOSIS — I7101 Dissection of thoracic aorta: Secondary | ICD-10-CM

## 2018-07-10 DIAGNOSIS — I340 Nonrheumatic mitral (valve) insufficiency: Secondary | ICD-10-CM

## 2018-07-10 DIAGNOSIS — I2510 Atherosclerotic heart disease of native coronary artery without angina pectoris: Secondary | ICD-10-CM

## 2018-07-10 DIAGNOSIS — I4891 Unspecified atrial fibrillation: Secondary | ICD-10-CM

## 2018-07-11 ENCOUNTER — Ambulatory Visit: Payer: Medicare Other | Admitting: Physician Assistant

## 2018-07-11 ENCOUNTER — Other Ambulatory Visit: Payer: Self-pay | Admitting: Endocrinology

## 2018-07-13 NOTE — Progress Notes (Signed)
[place]  [phone] [phone]  Clinic Follow Up Note   Patient Care Team: [name], MD as PCP - General (Family Medicine) [name], MD as PCP - Cardiology (Cardiology) 07/19/2018   CHIEF COMPLAINTS:  Follow up  left breast cancer  Oncology History   Breast cancer of upper-outer quadrant of left female breast [facility])   Staging form: Breast, AJCC 7th Edition   - Clinical stage from 01/28/2016: Stage IA (T1a, N0, M0) - Signed by Truitt Merle, MD on 02/03/2016   - Pathologic stage from 02/23/2016: Stage IA (T1b, N0, cM0) - Signed by Truitt Merle, MD on 06/14/2016        Breast cancer of upper-outer quadrant of left female breast ([place])   01/14/2016 Mammogram    Screening mammogram showed possible mass with distortion in the left breast.    01/23/2016 Imaging    Diagnostic mammogram and ultrasound of the left breast showed a 3-4 mm shadowing mass at 1:00 in the left breast, no adenopathy.    01/28/2016 Initial Diagnosis    Breast cancer of upper-outer quadrant of left female breast ([place])    01/28/2016 Initial Biopsy    Left breast mass biopsy showed invasive ductal carcinoma, grade 1.    01/28/2016 Receptors her2    Your 100% positive, PR 100% positive, HER-2 negative, Ki-67 5%    02/23/2016 Surgery    Left breast lumpectomy, and sentinel lymph node biopsy    02/23/2016 Pathology Results    Left breast lumpectomy showed a 1.0 cm invasive ductal carcinoma, grade 1, no lymphvascular invasion, invasive carcinoma is broadly less than 0.1 cm to the anterior inferior margin, ADH, ALH, 1 node(-). Left inferior reexcision margin showed LCIS     04/21/2016 - 05/19/2016 Radiation Therapy    adjuvant breast radiation     06/10/2016 -  Anti-estrogen oral therapy    Anastrozole 1 mg daily, changed to  Tamoxifen due to osteoporosis on 09/30/17      02/03/2017 Mammogram    Bilateral diagnostic mammogram 02/03/17 IMPRESSION: Expected surgical changes in the  upper-outer quadrant of the left breast. No mammographic evidence of malignancy in the bilateral breasts.    02/20/2018 Mammogram    02/20/2018 Mammogram IMPRESSION: No evidence of malignancy within either breast. Stable postsurgical changes within the left breast.      HISTORY OF PRESENTING ILLNESS:  Michele Green 77 y.o. female is here because of newly diagnosed left breast cancer. She is accompanied by her husband to our multidisciplinary rest clinic today.  This was discovered by screening mammo, she denies any palpable breast mass, skin change or nipple discharge, she feels well, no complains. She remains to be physically active.   She  was found to have AF and had cardioverstion in 09/2015,  she has been on amiodarone and Xarelto since then. She also has had colitis 2-3 yeears, on liada,  she follows up with gastroenterologist Dr. Amedeo Plenty.  GYN HISTORY  Menarchal: 12 LMP: 48 Contraceptive: 8 HRT: 5 years, stopped when she was 18 G1P1: no breast feeding      CURRENTLY THERAPY:  1. Anastrozole 84m daily, started in 05/2016, changed to Tamoxifen due to osteoporosis on 09/30/17  2. Prolia injections every 6 months with Dr. sBrigitte Pulse INTERIM HISTORY:  Michele STRAUBreturns for follow-up. She is here alone. She is having open heart surgery soon due to valve insufficieny that is making her short of breath.  She is otherwise doing well.  She has been compliant with tamoxifen, tolerating well, no significant side effects.   MEDICAL HISTORY:  Past Medical History:  Diagnosis Date  . Breast cancer ([place])   . Breast cancer of upper-outer quadrant of left female breast ([place]) 02/02/2016  . Colitis   . Colon polyp 02/2012  . Coronary artery disease involving native coronary artery of native heart without angina pectoris 06/20/2018   multivessel CAD by catheterization  . Dupuytren contracture    bilateral hands  . Dysrhythmia   . Family history of breast cancer   . Hypertension   .  Mitral regurgitation 11/07/2013  . On continuous oral anticoagulation 08/22/2015   Started on Xarelto 08/12/2015   . Osteopenia   . Persistent atrial fibrillation ([place]) 08/22/2015   Started late August or early September 2016  . Radiation Therapy 04/21/16-05/19/16   left breast 47.72 Gy, boosted to 10 Gy  . Wears glasses     SURGICAL HISTORY: Past Surgical History:  Procedure Laterality Date  . BREAST BIOPSY Left 01/28/2016  . BREAST LUMPECTOMY Left 02/23/2016  . BREAST LUMPECTOMY WITH RADIOACTIVE SEED LOCALIZATION Left 02/23/2016   Procedure: BREAST LUMPECTOMY WITH RADIOACTIVE SEED LOCALIZATION;  Surgeon: [name], MD;  Location: [place];  Service: General;  Laterality: Left;  . CARDIOVERSION N/A 10/02/2015   Procedure: CARDIOVERSION;  Surgeon: Jerline Pain, MD;  Location: [facility] ENDOSCOPY;  Service: Cardiovascular;  Laterality: N/A;  . COLONOSCOPY    . DUPUYTREN CONTRACTURE RELEASE  2001   leftx2  . [place]   right  . DUPUYTREN CONTRACTURE RELEASE Right 05/02/2014   Procedure: EXCISION DUPUYTRENS RIGHT PALMAR/SMALL ;  Surgeon: Cammie Sickle, MD;  Location: [place];  Service: Orthopedics;  Laterality: Right;  . RIGHT/LEFT HEART CATH AND CORONARY ANGIOGRAPHY N/A 06/20/2018   Procedure: RIGHT/LEFT HEART CATH AND CORONARY ANGIOGRAPHY;  Surgeon: [name], MD;  Location: [place] CV LAB;  Service: Cardiovascular;  Laterality: N/A;  . TEE WITHOUT CARDIOVERSION N/A 06/20/2018   Procedure: TRANSESOPHAGEAL ECHOCARDIOGRAM (TEE);  Surgeon: Pixie Casino, MD;  Location: [facility] ENDOSCOPY;  Service: Cardiovascular;  Laterality: N/A;  . TONSILLECTOMY  age 89    SOCIAL HISTORY: Social History   Socioeconomic History  . Marital status: Married    Spouse name: Not on file  . Number of children: 1  . Years of education: Not on file  . Highest education level: Not on file  Occupational History  . Not on file  Social  Needs  . Financial resource strain: Not on file  . Food insecurity:    Worry: Not on file    Inability: Not on file  . Transportation needs:    Medical: Not on file    Non-medical: Not on file  Tobacco Use  . Smoking status: Former Smoker    Packs/day: 1.00    Years: 20.00    Pack years: 20.00    Types: Cigarettes    Last attempt to quit: 11/22/1992    Years since quitting: 25.6  . Smokeless tobacco: Never Used  Substance and Sexual Activity  . Alcohol use: Yes    Alcohol/week: 3.0 standard drinks    Types: 3 Standard drinks or equivalent per week    Comment: social  . Drug use: No  . Sexual activity: Never    Partners: Male    Birth control/protection: Post-menopausal  Lifestyle  . Physical activity:    Days per week: Not on file    Minutes  per session: Not on file  . Stress: Not on file  Relationships  . Social connections:    Talks on phone: Not on file    Gets together: Not on file    Attends religious service: Not on file    Active member of club or organization: Not on file    Attends meetings of clubs or organizations: Not on file    Relationship status: Not on file  . Intimate partner violence:    Fear of current or ex partner: Not on file    Emotionally abused: Not on file    Physically abused: Not on file    Forced sexual activity: Not on file  Other Topics Concern  . Not on file  Social History Narrative  . Not on file    FAMILY HISTORY: Family History  Problem Relation Age of Onset  . Cirrhosis Mother   . Breast cancer Mother 23  . Heart failure Father   . Heart attack Father   . Breast cancer Sister        early 15's  . Kidney Stones Sister        loss of kidney due to stones  . Osteoporosis Neg Hx     ALLERGIES:  has No Known Allergies.  MEDICATIONS:  Current Outpatient Medications  Medication Sig Dispense Refill  . allopurinol (ZYLOPRIM) 100 MG tablet Take 200 mg by mouth daily.     . calcitRIOL (ROCALTROL) 0.25 MCG capsule TAKE 1  CAPSULE (0.25 MCG TOTAL) DAILY BY MOUTH. 30 capsule 2  . digoxin (LANOXIN) 0.125 MG tablet Take 0.5 tablets (0.0625 mg total) by mouth daily. Take one half tablet (0.0625 mg ) daily 45 tablet 3  . furosemide (LASIX) 20 MG tablet Take 1 tablet (20 mg total) by mouth daily. 90 tablet 3  . LIALDA 1.2 G EC tablet Take 4.8 g by mouth daily.     . metoprolol succinate (TOPROL-XL) 50 MG 24 hr tablet Take 25 mg by mouth daily. Take one half tablet by mouth (25 mg ) daily. Take with or immediately following a meal.    . potassium chloride SA (K-DUR,KLOR-CON) 20 MEQ tablet Take 1 tablet (20 mEq total) by mouth daily. 90 tablet 3  . simvastatin (ZOCOR) 20 MG tablet Take 20 mg by mouth daily.     . tamoxifen (NOLVADEX) 20 MG tablet Take 1 tablet (20 mg total) by mouth daily. 30 tablet 3  . XARELTO 20 MG TABS tablet TAKE 1 TABLET BY MOUTH  DAILY WITH SUPPER 90 tablet 3   No current facility-administered medications for this visit.     REVIEW OF SYSTEMS: Constitutional: Denies fevers, chills or abnormal night sweats Eyes: Denies blurriness of vision, double vision or watery eyes Ears, nose, mouth, throat, and face: Denies mucositis or sore throat Respiratory: Denies cough, dyspnea or wheezes Cardiovascular: Denies palpitation, chest discomfort or lower extremity swelling Gastrointestinal:  Denies nausea, heartburn or change in bowel habits Skin: Denies abnormal skin rashes Lymphatics: Denies new lymphadenopathy or easy bruising Neurological:Denies numbness, tingling or new weaknesses Musculoskeletal: negative  Behavioral/Psych: Mood is stable, no new changes  All other systems were reviewed with the patient and are negative.  PHYSICAL EXAMINATION: ECOG PERFORMANCE STATUS:1  Vitals:   07/19/18 1351  BP: 122/81  Pulse: 99  Resp: 18  Temp: 97.6 F (36.4 C)  SpO2: 98%   Filed Weights   07/19/18 1351  Weight: 125 lb 6.4 oz (56.9 kg)    GENERAL:alert, no distress and  comfortable, she uses a  walker  SKIN: skin color, texture, turgor are normal, no rashes or significant lesions EYES: normal, conjunctiva are pink and non-injected, sclera clear OROPHARYNX:no exudate, no erythema and lips, buccal mucosa, and tongue normal  NECK: supple, thyroid normal size, non-tender, without nodularity LYMPH:  no palpable lymphadenopathy in the cervical, axillary or inguinal LUNGS: clear to auscultation and percussion with normal breathing effort HEART: regular rate & rhythm and no murmurs and no lower extremity edema ABDOMEN:abdomen soft, non-tender and normal bowel sounds Musculoskeletal:no cyanosis of digits and no clubbing.  PSYCH: alert & oriented x 3 with fluent speech NEURO: no focal motor/sensory deficits Breast: surgical scar in Left upper outer quadrant of breast has healed well. No palpable mass or adenopathy in either breast or axilla.   LABORATORY DATA:  CBC Latest Ref Rng & Units 07/19/2018 07/04/2018 06/14/2018  WBC 3.9 - 10.3 K/uL 4.5 5.7 4.7  Hemoglobin 11.6 - 15.9 g/dL 12.3 12.5 12.8  Hematocrit 34.8 - 46.6 % 36.2 36.1 36.2  Platelets 145 - 400 K/uL 127(L) 132(L) 133(L)   CMP Latest Ref Rng & Units 07/04/2018 06/14/2018 05/30/2018  Glucose 65 - 99 mg/dL 92 97 84  BUN 8 - 27 mg/dL _0 Creatinine 0.57 - 1.00 mg/dL 0.85 0.81 0.93  Sodium 134 - 144 mmol/L 141 140 142  Potassium 3.5 - 5.2 mmol/L 4.4 4.3 4.1  Chloride 96 - 106 mmol/L 106 103 104  CO2 20 - 29 mmol/L _1 Calcium 8.7 - 10.3 mg/dL 9.3 9.1 9.8  Total Protein 6.0 - 8.5 g/dL - 6.7 -  Total Bilirubin 0.0 - 1.2 mg/dL - 0.7 -  Alkaline Phos 39 - 117 IU/L - 62 -  AST 0 - 40 IU/L - 38 -  ALT 0 - 32 IU/L - 26 -    Diagnosis 02/23/2016 1. Breast, lumpectomy, left - INVASIVE DUCTAL CARCINOMA, GRADE I/III, SPANNING 1.0 CM. - INVASIVE CARCINOMA IS BROADLY LESS THAN 0.1 CM TO THE ANTERIOR/INFERIOR MARGIN OF SPECIMEN #1. - ATYPICAL DUCTAL HYPERPLASIA. - LOBULAR NEOPLASIA (ATYPICAL LOBULAR HYPERPLASIA) - ONE BENIGN  LYMPH NODE (0/1). - SEE ONCOLOGY TABLE BELOW. 2. Breast, excision, left inferior - LOBULAR NEOPLASIA (LOBULAR CARCINOMA IN SITU). - SEE COMMENT. 1. 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 BREAST, INVASIVE TUMOR, WITH LYMPH NODES PRESENT Specimen, including laterality and lymph node sampling (sentinel, non-sentinel): Left breast. Procedure: Seed localized lumpectomy and additional inferior margin resection. Histologic type: Ductal. Grade: 1 Tubule formation: 1 Nuclear pleomorphism: 2 Mitotic:1 Tumor size (gross measurement): 1.0 cm Margins: Invasive, distance to closest margin: Broadly less than 0.1 cm to the anterior/inferior margin of specimen #1, see comment. Lymphovascular invasion: Not identified. Ductal carcinoma in situ: Not identified. Lobular neoplasia: Present, lobular carcinoma in situ. Tumor focality: Unifocal. Treatment effect: N/A Extent of tumor: Confined to breast parenchyma. Lymph nodes: Examined: 0 Sentinel,  1 Non-sentinel 1 Total Lymph nodes with metastasis: 0 Breast prognostic profile: Case [id number] Estrogen receptor: 100%, strong staining intensity Progesterone receptor: 100%, strong staining intensity Her 2 neu: No amplification was detected. The ratio was 1.35. Her 2 neu by FISH will be repeated on the current case (block 1A) and the results reported separately. Ki-67: 5% Non-neoplastic breast: Fibrocystic changes with calcifications and healing biopsy site TNM: pT1b, pN0 Comments: Immunohistochemical stains for smooth muscle myosin, calponin, and p63 support the above diagnosis. Although the invasive carcinoma is broadly less than 0.1 cm to the anterior/inferior margin of specimen #1, the separately submitted inferior  margin (specimen #2) is negative. 2. The surgical resection margin(s) of the specimen were inked and microscopically evaluated. (JBK:ecj 02/27/2016) Enid Cutter MD Pathologist, Electronic Signature (Case signed  02/27/2016) Results: HER2 - NEGATIVE RATIO OF HER2/CEP17 SIGNALS 1.54 AVERAGE HER2 COPY NUMBER PER CELL 2.00  RADIOGRAPHIC STUDIES: I have personally reviewed the radiological images as listed and agreed with the findings in the report.  02/20/2018 Mammogram IMPRESSION: No evidence of malignancy within either breast. Stable postsurgical changes within the left breast.  Bone Density scan 07/15/17 ASSESSMENT: The BMD measured at Femur Neck Left is 0.581 g/cm2 with a T-score of -3.3. This patient is considered osteoporotic according to [facility]) criteria. Lumbar spine was not utilized due to surgical hardware. There has been a statistically significant decrease in BMD of the left hip since prior exam dated 11/11/2008. Patient does not meet criteria for FRAX assessment. Site Region Measured Date Measured Age [age]A BMD Significant CHANGE T-score DualFemur Neck Left 07/15/2017 78.5 -3.1 0.581 g/cm2 * Left Forearm Radius 33% 07/15/2017 78.5 [age] 0.704 g/cm2   Bilateral diagnostic mammogram 02/03/17 IMPRESSION: Expected surgical changes in the upper-outer quadrant of the left breast. No mammographic evidence of malignancy in the bilateral breasts.  Mm Diag Breast Tomo Uni Left and [place] 01/23/2016  01/23/2016  CLINICAL DATA:  77 year old female presenting for screening recall of left breast distortion. EXAM: DIGITAL DIAGNOSTIC LEFT MAMMOGRAM WITH 3D TOMOSYNTHESIS AND CAD LEFT BREAST ULTRASOUND COMPARISON:  Previous exam(s). ACR Breast Density Category c: The breast tissue is heterogeneously dense, which may obscure small masses. FINDINGS: There is a persistent distortion in the upper-outer quadrant of the left breast in the posterior depth with a possible tiny associated mass. Mammographic images were processed with CAD. Physical exam of the upper-outer quadrant of the left breast demonstrates a small firm palpable lump at 1 o'clock, 4 cm from the nipple. Ultrasound targeted to  the left breast at 1 o'clock, 4 cm from the nipple at the site of the palpable lump, there is a tiny 3-4 mm shadowing mass with possible internal blood flow on color Doppler imaging. Ultrasound of the left axilla demonstrates multiple normal-appearing lymph nodes. IMPRESSION: 1. A 3-4 mm shadowing mass is identified at 1 o'clock in the left breast, likely corresponding with the area of distortion on the mammogram. 2.  No evidence of left axillary lymphadenopathy. RECOMMENDATION: Ultrasound-guided biopsy is recommended for the shadowing mass in the left breast at 1 o'clock. This has been scheduled for 01/28/2016 at 2 p.m. I have discussed the findings and recommendations with the patient. Results were also provided in writing at the conclusion of the visit. If applicable, a reminder letter will be sent to the patient regarding the next appointment. BI-RADS CATEGORY  4: Suspicious. Electronically Signed   By: Ammie Ferrier M.D.   On: 01/23/2016 11:40   Mm Screening Breast Tomo Bilateral  01/14/2016  CLINICAL DATA:  Screening. EXAM: DIGITAL SCREENING BILATERAL MAMMOGRAM WITH 3D TOMO WITH CAD COMPARISON:  Previous exam(s). ACR Breast Density Category c: The breast tissue is heterogeneously dense, which may obscure small masses. FINDINGS: In the left breast, possible mass with distortion warrants further evaluation. In the right breast, no findings suspicious for malignancy. Images were processed with CAD. IMPRESSION: Further evaluation is suggested for possible mass with distortion in the left breast. RECOMMENDATION: Diagnostic mammogram and possibly ultrasound of the left breast. (Code:[id number]) The patient will be contacted regarding the findings, and additional imaging will be scheduled. BI-RADS CATEGORY  0: Incomplete. Need  additional imaging evaluation and/or prior mammograms for comparison. Electronically Signed   By: Lajean Manes M.D.   On: 01/14/2016 11:36     ASSESSMENT & PLAN:  77 y.o.  postmenopausal Caucasian woman, presented with screening this covered left breast cancer.  1. Breast cancer of upper-outer quadrant of left female breast, G1 invasive ductal carcinoma, pT1bN0M0, stage IA, ER+/PR+/HER2- -I previously reviewed her surgical pathology findings with patient in details. -She has stage I a disease, low grade, likely will do very well with stroke risk of recurrence. -We previously discussed the risk of cancer recurrence after complete surgical resection. Giving the very small size of the tumor, low-grade, low Ki-67, ER PR positive HER-2 negative disease, the risk of recurrence is quite low. I do not recommend adjuvant chemotherapy. -She has completed adjuvant breast radiation. --She started adjuvant anastrozole, due to her worsening bone density, history of spinal fracture, I have switched her anastrozole to tamoxifen.  She tolerating tamoxifen well, will continue. -She is scheduled for heart surgery soon and will attend rehab after that. I will stop tamoxifen now and restart in 2 months after she recovers from surgery, due to the risk of thrombosis. -2019 April Mammogram was benign -She is clinically doing very well, asymptomatic, lab reviewed, physical exam was unremarkable today. No clinical recurrence. -We will continue breast cancer surveillance, including annual screening mammogram, self exam, routine follow-up with exam by [place]. -I advised her to take multivitamins - repeat mammogram in 2020 -Follow-up in October.  2. Osteoporosis  -She  had spinal fracture after a mild fall in 2018 -Her PCP Dr. Brigitte Pulse has recommended her to start prolia injection, she is tolerating well  -07/15/17 bone density shows osteoporosis with the lowest T-Score in the left femur neck, -3.3, which has definitely gotten worse since 2014, when her T score was -2.2 -She will switch to Tamoxifen as of 09/30/17 and will continue prolia injections with Dr. Brigitte Pulse   3. HTN, AF, mitral regurgitation    -She'll continue follow-up with her primary care physician and cardiologist. -She is scheduled for heart surgery soon. I will see her after she recovers to restart tamoxifen.   4. Genetics -Given her positive family history of breast cancer in her mother and a sister, we referred her to genetic counseling to rule out inheritable breast cancer syndrome.  -She was seen by our genetic counselor, and the genetic testing was negative.  5. Spinal fracture  -she will follow up with her orthopedic surgeon -I strongly encouraged her to follow up bone density scan every 2 years, to monitor her bone density  -Has surgery on her back on 08/23/17 to remove screws, she has recovered well     Plan -She is scheduled for open heart surgery on August 10, 2018.  Due to her risk of thrombosis, I will stop tamoxifen now and restart in 2 months when she recovers from surgery -F/u in 2 months -Mammogram in April 2020   All questions were answered. The patient knows to call the clinic with any problems, questions or concerns.  I spent 15 minutes counseling the patient face to face. The total time spent in the appointment was 20 minutes and more than 50% was on counseling.   Dierdre Searles Dweik am acting as scribe for Dr. Truitt Merle.  I have reviewed the above documentation for accuracy and completeness, and I agree with the above.     Truitt Merle, MD 07/19/2018

## 2018-07-14 ENCOUNTER — Telehealth: Payer: Self-pay | Admitting: Endocrinology

## 2018-07-14 ENCOUNTER — Other Ambulatory Visit: Payer: Self-pay

## 2018-07-14 NOTE — Telephone Encounter (Signed)
Called and spoke to patient informing her that is it not Dr. Cordelia Pen office requesting refills. Understanding verbalized nothing further needed at this time.

## 2018-07-14 NOTE — Telephone Encounter (Signed)
Patient stated that she is still receiving phone calls that the pharmacy has received refills from our office. She stated she does not see DR Loanne Drilling anymore and would like to know if we could let the pharmacy know she no longer wants those medications.

## 2018-07-17 ENCOUNTER — Inpatient Hospital Stay: Admission: RE | Admit: 2018-07-17 | Payer: Medicare Other | Source: Ambulatory Visit

## 2018-07-17 ENCOUNTER — Ambulatory Visit: Payer: Medicare Other | Admitting: Hematology

## 2018-07-17 ENCOUNTER — Other Ambulatory Visit: Payer: Medicare Other

## 2018-07-19 ENCOUNTER — Encounter: Payer: Self-pay | Admitting: Hematology

## 2018-07-19 ENCOUNTER — Telehealth: Payer: Self-pay | Admitting: Hematology

## 2018-07-19 ENCOUNTER — Inpatient Hospital Stay (HOSPITAL_BASED_OUTPATIENT_CLINIC_OR_DEPARTMENT_OTHER): Payer: Medicare Other | Admitting: Hematology

## 2018-07-19 ENCOUNTER — Inpatient Hospital Stay: Payer: Medicare Other | Attending: Hematology

## 2018-07-19 VITALS — BP 122/81 | HR 99 | Temp 97.6°F | Resp 18 | Ht 64.0 in | Wt 125.4 lb

## 2018-07-19 DIAGNOSIS — Z87891 Personal history of nicotine dependence: Secondary | ICD-10-CM | POA: Diagnosis not present

## 2018-07-19 DIAGNOSIS — M810 Age-related osteoporosis without current pathological fracture: Secondary | ICD-10-CM | POA: Diagnosis not present

## 2018-07-19 DIAGNOSIS — I340 Nonrheumatic mitral (valve) insufficiency: Secondary | ICD-10-CM

## 2018-07-19 DIAGNOSIS — C50412 Malignant neoplasm of upper-outer quadrant of left female breast: Secondary | ICD-10-CM

## 2018-07-19 DIAGNOSIS — Z170 Estrogen receptor positive status [ER+]: Secondary | ICD-10-CM

## 2018-07-19 DIAGNOSIS — Z7901 Long term (current) use of anticoagulants: Secondary | ICD-10-CM | POA: Diagnosis not present

## 2018-07-19 DIAGNOSIS — I4891 Unspecified atrial fibrillation: Secondary | ICD-10-CM | POA: Diagnosis not present

## 2018-07-19 DIAGNOSIS — I10 Essential (primary) hypertension: Secondary | ICD-10-CM | POA: Insufficient documentation

## 2018-07-19 LAB — CBC WITH DIFFERENTIAL/PLATELET
Basophils Absolute: 0 10*3/uL (ref 0.0–0.1)
Basophils Relative: 1 %
EOS PCT: 2 %
Eosinophils Absolute: 0.1 10*3/uL (ref 0.0–0.5)
HEMATOCRIT: 36.2 % (ref 34.8–46.6)
Hemoglobin: 12.3 g/dL (ref 11.6–15.9)
LYMPHS ABS: 0.8 10*3/uL — AB (ref 0.9–3.3)
LYMPHS PCT: 19 %
MCH: 36.6 pg — AB (ref 25.1–34.0)
MCHC: 34.1 g/dL (ref 31.5–36.0)
MCV: 107.6 fL — AB (ref 79.5–101.0)
MONO ABS: 0.3 10*3/uL (ref 0.1–0.9)
MONOS PCT: 8 %
NEUTROS ABS: 3.2 10*3/uL (ref 1.5–6.5)
Neutrophils Relative %: 70 %
PLATELETS: 127 10*3/uL — AB (ref 145–400)
RBC: 3.36 MIL/uL — ABNORMAL LOW (ref 3.70–5.45)
RDW: 13.4 % (ref 11.2–14.5)
WBC: 4.5 10*3/uL (ref 3.9–10.3)

## 2018-07-19 NOTE — Telephone Encounter (Signed)
Gave pt avs and calendar  °

## 2018-08-01 ENCOUNTER — Ambulatory Visit
Admission: RE | Admit: 2018-08-01 | Discharge: 2018-08-01 | Disposition: A | Discharge status: Home / Self Care | Payer: Medicare Other | Source: Ambulatory Visit | Attending: Thoracic Surgery (Cardiothoracic Vascular Surgery) | Admitting: Thoracic Surgery (Cardiothoracic Vascular Surgery)

## 2018-08-01 DIAGNOSIS — I71019 Dissection of thoracic aorta, unspecified: Secondary | ICD-10-CM

## 2018-08-01 DIAGNOSIS — I7101 Dissection of thoracic aorta: Secondary | ICD-10-CM

## 2018-08-01 MED ORDER — IOPAMIDOL (ISOVUE-370) INJECTION 76%
75.0000 mL | Freq: Once | INTRAVENOUS | Status: AC | PRN
  Administered 2018-08-01: 75 mL via INTRAVENOUS

## 2018-08-04 NOTE — Progress Notes (Addendum)
PCP: [name], MD  Cardiologist: Daneen Schick, MD  EKG: obtain at PAT appt  Stress test: 06/20/18 in EPIC  ECHO: 05/3018 in EPIC  Cardiac Cath: 06/20/18 in EPIC  Chest x-ray: obtain at PAT appt  Pt is on Xarelto- medication has been held since 08/03/18 per patient instructions

## 2018-08-04 NOTE — Pre-Procedure Instructions (Signed)
Michele Green  08/04/2018      [id number] [address] [id number]: [phone]: [phone]   Your procedure is scheduled on August 10, 2018.  [facility]  Call this number if you have problems the morning of surgery:  [phone]  Remember:  Do not eat or drink after midnight.    Take these medicines the morning of surgery with A SIP OF WATER  allopurinal (zyloprim) Digoxin (lanoxin) Lialda  Follow your surgeon's instructions on when to hold/resume Xarelto.  If no instructions were given call the office to determine how they would like to you take Xarelto  7 days prior to surgery STOP taking any Aspirin (unless otherwise instructed by your surgeon), Aleve, Naproxen, Ibuprofen, Motrin, Advil, Goody's, BC's, all herbal medications, fish oil, and all vitamins   Contacts, dentures or bridgework may not be worn into surgery.  Leave your suitcase in the car.  After surgery it may be brought to your room.  For patients admitted to the hospital, discharge time will be determined by your treatment team.  Patients discharged the day of surgery will not be allowed to drive home.    [facility]- Preparing For Surgery  Before surgery, you can play an important role. Because skin is not sterile, your skin needs to be as free of germs as possible. You can reduce the number of germs on your skin by washing with CHG (chlorahexidine gluconate) Soap before surgery.  CHG is an antiseptic cleaner which kills germs and bonds with the skin to continue killing germs even after washing.    Oral Hygiene is also important to reduce your risk of infection.  Remember - BRUSH YOUR TEETH THE MORNING OF SURGERY WITH YOUR REGULAR TOOTHPASTE  Please do not use if you have an allergy to CHG or antibacterial soaps. If your skin becomes reddened/irritated stop using the CHG.  Do not shave  (including legs and underarms) for at least 48 hours prior to first CHG shower. It is OK to shave your face.  Please follow these instructions carefully.   1. Shower the NIGHT BEFORE SURGERY and the MORNING OF SURGERY with CHG.   2. If you chose to wash your hair, wash your hair first as usual with your normal shampoo.  3. After you shampoo, rinse your hair and body thoroughly to remove the shampoo.  4. Use CHG as you would any other liquid soap. You can apply CHG directly to the skin and wash gently with a scrungie or a clean washcloth.   5. Apply the CHG Soap to your body ONLY FROM THE NECK DOWN.  Do not use on open wounds or open sores. Avoid contact with your eyes, ears, mouth and genitals (private parts). Wash Face and genitals (private parts)  with your normal soap.  6. Wash thoroughly, paying special attention to the area where your surgery will be performed.  7. Thoroughly rinse your body with warm water from the neck down.  8. DO NOT shower/wash with your normal soap after using and rinsing off the CHG Soap.  9. Pat yourself dry with a CLEAN TOWEL.  10. Wear CLEAN PAJAMAS to bed the night before surgery, wear comfortable clothes the morning of surgery  11. Place CLEAN SHEETS on your bed the night of your first shower and DO NOT SLEEP WITH PETS.  Day of Surgery:  Do  not apply any deodorants/lotions.  Please wear clean clothes to the hospital/surgery center.   Remember to brush your teeth WITH YOUR REGULAR TOOTHPASTE.   Do not wear jewelry, make-up or nail polish.  Do not wear lotions, powders, or perfumes, or deodorant.  Do not shave 48 hours prior to surgery.    Do not bring valuables to the hospital.   [facility] is not responsible for any belongings or valuables.  Please read over the following fact sheets that you were given.

## 2018-08-07 ENCOUNTER — Encounter: Payer: Self-pay | Admitting: Thoracic Surgery (Cardiothoracic Vascular Surgery)

## 2018-08-07 ENCOUNTER — Encounter (HOSPITAL_COMMUNITY): Payer: Self-pay

## 2018-08-07 ENCOUNTER — Ambulatory Visit (HOSPITAL_BASED_OUTPATIENT_CLINIC_OR_DEPARTMENT_OTHER)
Admission: RE | Admit: 2018-08-07 | Discharge: 2018-08-07 | Disposition: A | Discharge status: Home / Self Care | Payer: Medicare Other | Source: Ambulatory Visit | Attending: Thoracic Surgery (Cardiothoracic Vascular Surgery) | Admitting: Thoracic Surgery (Cardiothoracic Vascular Surgery)

## 2018-08-07 ENCOUNTER — Encounter (HOSPITAL_COMMUNITY)
Admission: RE | Admit: 2018-08-07 | Discharge: 2018-08-07 | Disposition: A | Discharge status: Home / Self Care | Payer: Medicare Other | Source: Ambulatory Visit | Attending: Thoracic Surgery (Cardiothoracic Vascular Surgery) | Admitting: Thoracic Surgery (Cardiothoracic Vascular Surgery)

## 2018-08-07 ENCOUNTER — Other Ambulatory Visit: Payer: Self-pay

## 2018-08-07 ENCOUNTER — Ambulatory Visit (HOSPITAL_COMMUNITY)
Admission: RE | Admit: 2018-08-07 | Discharge: 2018-08-07 | Disposition: A | Discharge status: Home / Self Care | Payer: Medicare Other | Source: Ambulatory Visit | Attending: Thoracic Surgery (Cardiothoracic Vascular Surgery) | Admitting: Thoracic Surgery (Cardiothoracic Vascular Surgery)

## 2018-08-07 ENCOUNTER — Ambulatory Visit: Payer: Medicare Other | Admitting: Thoracic Surgery (Cardiothoracic Vascular Surgery)

## 2018-08-07 VITALS — BP 130/70 | HR 104 | Resp 18 | Ht 64.0 in | Wt 127.6 lb

## 2018-08-07 DIAGNOSIS — I481 Persistent atrial fibrillation: Secondary | ICD-10-CM | POA: Diagnosis not present

## 2018-08-07 DIAGNOSIS — I2510 Atherosclerotic heart disease of native coronary artery without angina pectoris: Secondary | ICD-10-CM

## 2018-08-07 DIAGNOSIS — I4819 Other persistent atrial fibrillation: Secondary | ICD-10-CM

## 2018-08-07 DIAGNOSIS — I340 Nonrheumatic mitral (valve) insufficiency: Secondary | ICD-10-CM

## 2018-08-07 DIAGNOSIS — J449 Chronic obstructive pulmonary disease, unspecified: Secondary | ICD-10-CM | POA: Insufficient documentation

## 2018-08-07 DIAGNOSIS — I4891 Unspecified atrial fibrillation: Secondary | ICD-10-CM | POA: Insufficient documentation

## 2018-08-07 DIAGNOSIS — I10 Essential (primary) hypertension: Secondary | ICD-10-CM

## 2018-08-07 DIAGNOSIS — R9431 Abnormal electrocardiogram [ECG] [EKG]: Secondary | ICD-10-CM | POA: Insufficient documentation

## 2018-08-07 DIAGNOSIS — Z01818 Encounter for other preprocedural examination: Secondary | ICD-10-CM | POA: Insufficient documentation

## 2018-08-07 DIAGNOSIS — G5623 Lesion of ulnar nerve, bilateral upper limbs: Secondary | ICD-10-CM

## 2018-08-07 DIAGNOSIS — R918 Other nonspecific abnormal finding of lung field: Secondary | ICD-10-CM

## 2018-08-07 LAB — COMPREHENSIVE METABOLIC PANEL
ALT: 14 U/L (ref 0–44)
AST: 37 U/L (ref 15–41)
Albumin: 3.3 g/dL — ABNORMAL LOW (ref 3.5–5.0)
Alkaline Phosphatase: 52 U/L (ref 38–126)
Anion gap: 10 (ref 5–15)
BUN: 14 mg/dL (ref 8–23)
CALCIUM: 8.1 mg/dL — AB (ref 8.9–10.3)
CO2: 18 mmol/L — AB (ref 22–32)
Chloride: 111 mmol/L (ref 98–111)
Creatinine, Ser: 0.7 mg/dL (ref 0.44–1.00)
GFR calc non Af Amer: >60 mL/min (ref >60)
GLUCOSE: 95 mg/dL (ref 70–99)
Potassium: 4.6 mmol/L (ref 3.5–5.1)
SODIUM: 139 mmol/L (ref 135–145)
Total Bilirubin: 1.1 mg/dL (ref 0.3–1.2)
Total Protein: 6.5 g/dL (ref 6.5–8.1)

## 2018-08-07 LAB — PULMONARY FUNCTION TEST
DL/VA % PRED: 57 %
DL/VA: 2.77 ml/min/mmHg/L
DLCO COR % PRED: 41 %
DLCO UNC: 9.48 ml/min/mmHg
DLCO cor: 10.13 ml/min/mmHg
DLCO unc % pred: 39 %
FEF 25-75 Post: 1.58 L/sec
FEF 25-75 Pre: 0.99 L/sec
FEF2575-%CHANGE-POST: 59 %
FEF2575-%PRED-POST: 110 %
FEF2575-%Pred-Pre: 69 %
FEV1-%CHANGE-POST: 6 %
FEV1-%PRED-POST: 85 %
FEV1-%Pred-Pre: 79 %
FEV1-PRE: 1.56 L
FEV1-Post: 1.66 L
FEV1FVC-%CHANGE-POST: 6 %
FEV1FVC-%Pred-Pre: 100 %
FEV6-%Change-Post: 2 %
FEV6-%Pred-Post: 83 %
FEV6-%Pred-Pre: 82 %
FEV6-PRE: 2.04 L
FEV6-Post: 2.08 L
FEV6FVC-%Change-Post: 2 %
FEV6FVC-%PRED-PRE: 103 %
FEV6FVC-%Pred-Post: 106 %
FVC-%CHANGE-POST: 0 %
FVC-%PRED-POST: 79 %
FVC-%PRED-PRE: 79 %
FVC-POST: 2.08 L
FVC-PRE: 2.09 L
PRE FEV6/FVC RATIO: 98 %
Post FEV1/FVC ratio: 80 %
Post FEV6/FVC ratio: 100 %
Pre FEV1/FVC ratio: 75 %
RV % PRED: 72 %
RV: 1.73 L
TLC % pred: 80 %
TLC: 4.06 L

## 2018-08-07 LAB — URINALYSIS, ROUTINE W REFLEX MICROSCOPIC
BILIRUBIN URINE: NEGATIVE
GLUCOSE, UA: NEGATIVE mg/dL
KETONES UR: NEGATIVE mg/dL
Leukocytes, UA: NEGATIVE
Nitrite: NEGATIVE
PH: 5 (ref 5.0–8.0)
Protein, ur: NEGATIVE mg/dL
Specific Gravity, Urine: 1.013 (ref 1.005–1.030)

## 2018-08-07 LAB — BLOOD GAS, ARTERIAL
Acid-base deficit: 5.5 mmol/L — ABNORMAL HIGH (ref 0.0–2.0)
Bicarbonate: 18 mmol/L — ABNORMAL LOW (ref 20.0–28.0)
Drawn by: 470591
FIO2: 21
O2 Saturation: 99.5 %
PATIENT TEMPERATURE: 98.6
PO2 ART: 149 mmHg — AB (ref 83.0–108.0)
pCO2 arterial: 27.5 mmHg — ABNORMAL LOW (ref 32.0–48.0)
pH, Arterial: 7.432 (ref 7.350–7.450)

## 2018-08-07 LAB — CBC
HCT: 34.8 % — ABNORMAL LOW (ref 36.0–46.0)
Hemoglobin: 11.5 g/dL — ABNORMAL LOW (ref 12.0–15.0)
MCH: 36.9 pg — AB (ref 26.0–34.0)
MCHC: 33 g/dL (ref 30.0–36.0)
MCV: 111.5 fL — ABNORMAL HIGH (ref 78.0–100.0)
Platelets: 97 10*3/uL — ABNORMAL LOW (ref 150–400)
RBC: 3.12 MIL/uL — AB (ref 3.87–5.11)
RDW: 13.9 % (ref 11.5–15.5)
WBC: 3.9 10*3/uL — ABNORMAL LOW (ref 4.0–10.5)

## 2018-08-07 LAB — APTT: APTT: 28 s (ref 24–36)

## 2018-08-07 LAB — PROTIME-INR
INR: 1.11
Prothrombin Time: 14.2 seconds (ref 11.4–15.2)

## 2018-08-07 LAB — SURGICAL PCR SCREEN
MRSA, PCR: NEGATIVE
STAPHYLOCOCCUS AUREUS: NEGATIVE

## 2018-08-07 LAB — ABO/RH: ABO/RH(D): O POS

## 2018-08-07 MED ORDER — ALBUTEROL SULFATE (2.5 MG/3ML) 0.083% IN NEBU
2.5000 mg | INHALATION_SOLUTION | Freq: Once | RESPIRATORY_TRACT | Status: AC
  Administered 2018-08-07: 2.5 mg via RESPIRATORY_TRACT

## 2018-08-07 NOTE — Progress Notes (Signed)
Pre-op Cardiac Surgery  Carotid Findings:  1-39% ICA stenosis.  Vertebral artery flow is antegrade.   Upper Extremity Right Left  Brachial Pressures 123T T (restricted)  Radial Waveforms T T  Ulnar Waveforms T T  Palmar Arch (Allen's Test) WNL WNL

## 2018-08-07 NOTE — Patient Instructions (Addendum)
Do not take Xarelto  Continue taking all other medications without change through the day before surgery.  Have nothing to eat or drink after midnight the night before surgery.  On the morning of surgery take only Toprol and allopurinol with a sip of water.

## 2018-08-07 NOTE — Progress Notes (Signed)
[address]       [place] [address]             [phone]     CARDIOTHORACIC SURGERY OFFICE NOTE  Referring Provider is [name], MD PCP is [name], MD   HPI:  Patient is a 77 year old female with history of mitral regurgitation, long-standing persistent atrial fibrillation on oral anticoagulation, hypertension, chronic diastolic congestive heart failure, previous spine surgery for management of compression fracture of the lumbar spine, and remote history of breast cancer who returns to the office today for follow-up with plans to proceed with surgery later this week for treatment of severe symptomatic primary mitral regurgitation, coronary artery disease, and persistent atrial fibrillation.  She was originally seen in consultation on July 07, 2018.  She returns the office today with tentative plans to proceed with surgery later this week.  She reports no new problems or complaints.  She still gets tired and short of breath with exertion but this has not gotten any worse.  She has not had any chest pain or chest tightness.  Appetite is fair.  Bowel function is stable.  She reports no abdominal complaints.   Current Outpatient Medications  Medication Sig Dispense Refill  . allopurinol (ZYLOPRIM) 100 MG tablet Take 200 mg by mouth daily.     . digoxin (LANOXIN) 0.125 MG tablet Take 0.5 tablets (0.0625 mg total) by mouth daily. Take one half tablet (0.0625 mg ) daily 45 tablet 3  . furosemide (LASIX) 20 MG tablet Take 1 tablet (20 mg total) by mouth daily. (Patient taking differently: Take 20 mg by mouth daily as needed for fluid. ) 90 tablet 3  . LIALDA 1.2 G EC tablet Take 4.8 g by mouth daily.     . potassium chloride SA (K-DUR,KLOR-CON) 20 MEQ tablet Take 1 tablet (20 mEq total) by mouth daily. (Patient taking differently: Take 20 mEq by mouth every other day. ) 90 tablet 3  . simvastatin (ZOCOR) 20 MG tablet Take 20 mg by mouth daily.     Alveda Reasons 20 MG TABS tablet TAKE 1 TABLET BY MOUTH  DAILY WITH SUPPER (Patient taking differently: Take 20 mg by mouth daily. ) 90 tablet 3   No current facility-administered medications for this visit.       Physical Exam:   BP 130/70 (BP Location: Right Arm, Patient Position: Sitting, Cuff Size: Normal)   Pulse (!) 104   Resp 18   Ht 5' 4"  (1.626 m)   Wt 127 lb 9.6 oz (57.9 kg)   SpO2 100% Comment: RA  BMI 21.90 kg/m   General:  Thin, well-appearing  Chest:   Clear to auscultation  CV:   Regular rate and rhythm with holosystolic murmur  Incisions:  n/a  Abdomen:  Soft nontender  Extremities:  Warm and well-perfused  Diagnostic Tests:  CT ANGIOGRAPHY CHEST, ABDOMEN AND PELVIS  TECHNIQUE: Multidetector CT imaging through the chest, abdomen and pelvis was performed using the standard protocol during bolus administration of intravenous contrast. Multiplanar reconstructed images and MIPs were obtained and reviewed to evaluate the vascular anatomy.  CONTRAST:  77m ISOVUE-370 IOPAMIDOL (ISOVUE-370) INJECTION 76%  COMPARISON:  None.  FINDINGS: CTA CHEST FINDINGS  Cardiovascular: Atherosclerosis of thoracic aorta is noted without aneurysm or dissection. Great vessels are widely patent without significant stenosis. Mild cardiomegaly is noted. Enlarged left atrium is noted. Mild coronary artery calcifications are noted. No pericardial effusion is noted.  Mediastinum/Nodes:  No enlarged mediastinal, hilar, or axillary lymph nodes. Thyroid gland, trachea, and esophagus demonstrate no significant findings.  Lungs/Pleura: Lungs are clear. No pleural effusion or pneumothorax.  Musculoskeletal: No chest wall abnormality. No acute or significant osseous findings.  Review of the MIP images confirms the above findings.  CTA ABDOMEN AND PELVIS FINDINGS  VASCULAR  Aorta: Atherosclerosis of abdominal aorta is noted without aneurysm or dissection.  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 No thrombosis is noted.  SMA: Patent without evidence of aneurysm, dissection, vasculitis or significant stenosis.  Renals: Both renal arteries are patent without evidence of aneurysm, dissection, vasculitis, fibromuscular dysplasia or significant stenosis.  IMA: Patent without evidence of aneurysm, dissection, vasculitis or significant stenosis.  Inflow: Patent without evidence of aneurysm, dissection, vasculitis or significant stenosis.  Veins: No obvious venous abnormality within the limitations of this arterial phase study.  Review of the MIP images confirms the above findings.  NON-VASCULAR  Hepatobiliary: No focal liver abnormality is seen. No gallstones, gallbladder wall thickening, or biliary dilatation.  Pancreas: Unremarkable. No pancreatic ductal dilatation or surrounding inflammatory changes.  Spleen: Mild splenomegaly is noted.  Adrenals/Urinary Tract: Adrenal glands are unremarkable. Kidneys are normal, without renal calculi, focal lesion, or hydronephrosis. Bladder is unremarkable.  Stomach/Bowel: Stomach is within normal limits. Appendix appears normal. No evidence of bowel wall thickening, distention, or inflammatory changes.  Lymphatic: No significant adenopathy is noted.  Reproductive: Uterus and bilateral adnexa are unremarkable.  Other: No abdominal wall hernia or abnormality. No abdominopelvic ascites.  Musculoskeletal: Old L2 and L3 severe compression fractures are noted. No acute abnormality is noted.  Review of the MIP images confirms the above findings.  IMPRESSION: No evidence of thoracic or abdominal aortic aneurysm or dissection.  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 No other evidence of significant mesenteric or renal artery stenosis.  Mild cardiomegaly is noted with left atrial  enlargement.  Mild splenomegaly.  Old L2 and L3 compression fractures are noted.  Aortic Atherosclerosis (ICD10-I70.0).   Electronically Signed   By: Marijo Conception, M.D.   On: 08/01/2018 10:43   Impression:  Patient has stage D severe symptomatic primary mitral regurgitation with preserved left ventricular systolic function, multivessel coronary artery disease without angina pectoris, and long-standing persistent atrial fibrillation.  She describes stable symptoms of exertional shortness of breath and fatigue consistent with chronic diastolic congestive heart failure, New York Heart Association functional class IIb.  Symptoms reportedly had been worse previously but have improved somewhat on oral diuretic therapy.  I have personally reviewed the patient's recent echocardiograms and diagnostic cardiac catheterization.  The patient has severe mitral regurgitation.  The functional anatomy of the mitral valve was not well elucidated on recent TEE.  There is definitely annular dilatation.  For the most part there is seems to be normal leaflet mobility although in some views there is suggestion of a partially calcified and/or restricted segment of the posterior leaflet.  There is no clearly convincing evidence for prolapse.  Findings are not suggestive of rheumatic disease.  There is severe left atrial enlargement.  Left ventricular function appears normal.  Diagnostic cardiac catheterization reveals multivessel coronary artery disease with significant stenosis of the left anterior descending coronary artery and the left circumflex coronary artery.  Pulmonary artery pressures were mildly elevated.  I agree the patient needs surgical intervention.  I suspect her mitral valve should be repairable, although the functional anatomy of the  valve was not well identified on recent TEE.  Given the severity of coronary artery disease she should undergo coronary artery bypass grafting at the time of  surgery.  She might benefit from concomitant Maze procedure.  Overall risks associated with surgery should be acceptably low although will be slightly elevated because of the patient's age and somewhat limited physical activity.   Plan:  The patient was again counseled at length regarding the indications, risks and potential benefits of mitral valve repair.  The rationale for elective surgery has been explained, including a comparison between surgery and continued medical therapy with close follow-up.  The likelihood of successful and durable valve repair has been discussed with particular reference to the findings of their recent echocardiogram.  Based upon these findings and previous experience, I have quoted them a greater than 90 percent likelihood of successful valve repair.  In the unlikely event that their valve cannot be successfully repaired, we discussed the possibility of replacing the mitral valve using a mechanical prosthesis with the attendant need for long-term anticoagulation versus the alternative of replacing it using a bioprosthetic tissue valve with its potential for late structural valve deterioration and failure, depending upon the patient's longevity.  The patient specifically requests that if the mitral valve must be replaced that it be done using a bioprosthetic tissue valve.   The need for concomitant coronary artery bypass grafting was discussed as well as implications regarding the surgical approach and long-term prognosis.  The relative risks and benefits of performing a maze procedure at the time of their surgery was discussed at length, including the expected likelihood of long term freedom from recurrent symptomatic atrial fibrillation and/or atrial flutter.  Expectations for her postoperative convalescence have been discussed.  She understands that because she lives alone she may need temporary placement in a skilled nursing facility for rehabilitation following surgery.   The  patient understands and accepts all potential risks of surgery including but not limited to risk of death, stroke or other neurologic complication, myocardial infarction, congestive heart failure, respiratory failure, renal failure, bleeding requiring transfusion and/or reexploration, arrhythmia, infection or other wound complications, pneumonia, pleural and/or pericardial effusion, pulmonary embolus, aortic dissection or other major vascular complication, or delayed complications related to valve repair or replacement including but not limited to structural valve deterioration and failure, thrombosis, embolization, endocarditis, or paravalvular leak.  All of her questions have been answered.  I spent in excess of 15 minutes during the conduct of this office consultation and >50% of this time involved direct face-to-face encounter with the patient for counseling and/or coordination of their care.   Valentina Gu. Roxy Manns, MD 08/07/2018 4:19 PM

## 2018-08-08 LAB — HEMOGLOBIN A1C
Hgb A1c MFr Bld: 4.6 % — ABNORMAL LOW (ref 4.8–5.6)
Mean Plasma Glucose: 85 mg/dL

## 2018-08-09 MED ORDER — SODIUM CHLORIDE 0.9 % IV SOLN
30.0000 ug/min | INTRAVENOUS | Status: AC
  Administered 2018-08-10: 40 ug/min via INTRAVENOUS
  Filled 2018-08-09: qty 2

## 2018-08-09 MED ORDER — TRANEXAMIC ACID 1000 MG/10ML IV SOLN
1.5000 mg/kg/h | INTRAVENOUS | Status: AC
  Administered 2018-08-10: 1.5 mg/kg/h via INTRAVENOUS
  Filled 2018-08-09: qty 25

## 2018-08-09 MED ORDER — SODIUM CHLORIDE 0.9 % IV SOLN
INTRAVENOUS | Status: DC
  Filled 2018-08-09: qty 30

## 2018-08-09 MED ORDER — EPINEPHRINE PF 1 MG/ML IJ SOLN
0.0000 ug/min | INTRAVENOUS | Status: DC
  Filled 2018-08-09: qty 4

## 2018-08-09 MED ORDER — KENNESTONE BLOOD CARDIOPLEGIA (KBC) MANNITOL SYRINGE (20%, 32ML)
32.0000 mL | Freq: Once | INTRAVENOUS | Status: DC
  Filled 2018-08-09: qty 32

## 2018-08-09 MED ORDER — SODIUM CHLORIDE 0.9 % IV SOLN
750.0000 mg | INTRAVENOUS | Status: DC
  Filled 2018-08-09: qty 750

## 2018-08-09 MED ORDER — VANCOMYCIN HCL 10 G IV SOLR
1250.0000 mg | INTRAVENOUS | Status: AC
  Administered 2018-08-10: 1250 mg via INTRAVENOUS
  Filled 2018-08-09: qty 1250

## 2018-08-09 MED ORDER — VANCOMYCIN HCL 1000 MG IV SOLR
INTRAVENOUS | Status: AC
  Administered 2018-08-10: 10:00:00
  Filled 2018-08-09: qty 1000

## 2018-08-09 MED ORDER — KENNESTONE BLOOD CARDIOPLEGIA VIAL
13.0000 mL | Freq: Once | Status: DC
  Filled 2018-08-09: qty 13

## 2018-08-09 MED ORDER — DEXMEDETOMIDINE HCL IN NACL 400 MCG/100ML IV SOLN
0.1000 ug/kg/h | INTRAVENOUS | Status: AC
  Administered 2018-08-10: 0.7 ug/kg/h via INTRAVENOUS
  Filled 2018-08-09: qty 100

## 2018-08-09 MED ORDER — DOPAMINE-DEXTROSE 3.2-5 MG/ML-% IV SOLN
0.0000 ug/kg/min | INTRAVENOUS | Status: DC
  Filled 2018-08-09: qty 250

## 2018-08-09 MED ORDER — NITROGLYCERIN IN D5W 200-5 MCG/ML-% IV SOLN
2.0000 ug/min | INTRAVENOUS | Status: DC
  Filled 2018-08-09: qty 250

## 2018-08-09 MED ORDER — SODIUM CHLORIDE 0.9 % IV SOLN
INTRAVENOUS | Status: AC
  Administered 2018-08-10: 1 [IU]/h via INTRAVENOUS
  Filled 2018-08-09: qty 1

## 2018-08-09 MED ORDER — TRANEXAMIC ACID (OHS) PUMP PRIME SOLUTION
2.0000 mg/kg | INTRAVENOUS | Status: DC
  Filled 2018-08-09: qty 1.16

## 2018-08-09 MED ORDER — GLUTARALDEHYDE 0.625% SOAKING SOLUTION
TOPICAL | Status: DC
  Filled 2018-08-09: qty 50

## 2018-08-09 MED ORDER — PLASMA-LYTE 148 IV SOLN
INTRAVENOUS | Status: AC
  Administered 2018-08-10: 500 mL
  Filled 2018-08-09: qty 2.5

## 2018-08-09 MED ORDER — TRANEXAMIC ACID (OHS) BOLUS VIA INFUSION
15.0000 mg/kg | INTRAVENOUS | Status: AC
  Administered 2018-08-10: 868.5 mg via INTRAVENOUS
  Filled 2018-08-09: qty 869

## 2018-08-09 MED ORDER — POTASSIUM CHLORIDE 2 MEQ/ML IV SOLN
80.0000 meq | INTRAVENOUS | Status: DC
  Filled 2018-08-09: qty 40

## 2018-08-09 MED ORDER — MILRINONE LACTATE IN DEXTROSE 20-5 MG/100ML-% IV SOLN
0.3000 ug/kg/min | INTRAVENOUS | Status: AC
  Administered 2018-08-10: .2 ug/kg/min via INTRAVENOUS
  Filled 2018-08-09: qty 100

## 2018-08-09 MED ORDER — SODIUM CHLORIDE 0.9 % IV SOLN
1.5000 g | INTRAVENOUS | Status: AC
  Administered 2018-08-10: 1.5 g via INTRAVENOUS
  Filled 2018-08-09: qty 1.5

## 2018-08-09 MED ORDER — MAGNESIUM SULFATE 50 % IJ SOLN
40.0000 meq | INTRAMUSCULAR | Status: DC
  Filled 2018-08-09: qty 9.85

## 2018-08-09 NOTE — Anesthesia Preprocedure Evaluation (Addendum)
Anesthesia Evaluation  Patient identified by MRN, date of birth, ID band Patient awake    Reviewed: Allergy & Precautions, NPO status , Patient's Chart, lab work & pertinent test results  History of Anesthesia Complications Negative for: history of anesthetic complications  Airway Mallampati: I  TM Distance: >3 FB Neck ROM: Full    Dental  (+) Dental Advisory Given   Pulmonary former smoker,    breath sounds clear to auscultation       Cardiovascular hypertension, Pt. on medications + CAD  + dysrhythmias Atrial Fibrillation + Valvular Problems/Murmurs MR  Rhythm:Regular Rate:Normal + Systolic murmurs  '19 Carotid US - 1-39% b/l ICAS  '19 Cath - Severe MR with marked enlargement of the left atrium. Mild pulmonary hypertension with mean pulmonary arterial pressure 28 mmHg. Phasic pressure 45/16 mmHg. Three-vessel coronary calcification with significant greater than 80% distal circumflex, 90% first diagonal ostium, and 80 to 90% mid LAD. Normal LV systolic function with EF 50 to 55%.  LVEDP 11 mmHg. Cardiac output 4.3 L/min/cardiac index 2.7 L/min/m  '19 TEE - LV cavity size was normal. Mild concentric hypertrophy. EF 60% to 65%. Severe MR with degenerate leafelts with mild posterior leaflet prolapse - no annular dilitation. LA massively dilated. RV cavity size was normal with normal wall thickness and systolic function. Trivial PR, mild TR. RA was normal in size.    Neuro/Psych negative neurological ROS  negative psych ROS   GI/Hepatic negative GI ROS, Neg liver ROS,   Endo/Other  negative endocrine ROS  Renal/GU negative Renal ROS  negative genitourinary   Musculoskeletal  Gout    Abdominal   Peds  Hematology  (+) anemia ,  Thrombocytopenia    Anesthesia Other Findings   Reproductive/Obstetrics  Breast cancer                            Anesthesia Physical Anesthesia Plan  ASA:  IV  Anesthesia Plan: General   Post-op Pain Management:    Induction: Intravenous  PONV Risk Score and Plan: 3 and Treatment may vary due to age or medical condition, Ondansetron and Midazolam  Airway Management Planned: Oral ETT  Additional Equipment: Arterial line, CVP, PA Cath, TEE and Ultrasound Guidance Line Placement  Intra-op Plan:   Post-operative Plan: Post-operative intubation/ventilation  Informed Consent: I have reviewed the patients History and Physical, chart, labs and discussed the procedure including the risks, benefits and alternatives for the proposed anesthesia with the patient or authorized representative who has indicated his/her understanding and acceptance.   Dental Advisory Given  Plan Discussed with: CRNA and Anesthesiologist  Anesthesia Plan Comments:       Anesthesia Quick Evaluation

## 2018-08-09 NOTE — H&P (Signed)
[name], [name]          CARDIOTHORACIC SURGERY HISTORY AND PHYSICAL EXAM  Referring Provider is [name], MD PCP is [name], MD      Chief Complaint  Patient presents with  . Mitral Regurgitation    new patient consultation, cath 06/20/18  . Coronary Artery Disease  . Atrial Fibrillation    HPI:  Patient is a 77 year old female with history of mitral regurgitation, long-standing persistent atrial fibrillation on oral anticoagulation, hypertension, chronic diastolic congestive heart failure, previous spine surgery for management of compression fracture of the lumbar spine, and remote history of breast cancer who has been referred for surgical consultation to discuss treatment options for management of severe symptomatic primary mitral regurgitation, coronary artery disease, and persistent atrial fibrillation.  Patient states that she has known of presence of a heart murmur for at least 4 years.  She describes chronic symptoms of exertional shortness of breath and fatigue for a similar amount of time.  In 2016 she was diagnosed with persistent atrial fibrillation and she has been cared for by [name] ever since.  She underwent attempted DC cardioversion but failed to maintain sinus rhythm.  She has been treated with rate control and long-term anticoagulation using warfarin.  Previous echocardiograms have documented the presence of normal left ventricular systolic function with what was felt to be mild mitral regurgitation.  She underwent spine surgery for lumbar compression fracture with complications, ultimately needing redo spine surgery to remove the previously placed screws.  Her physical recovery following this was slow.  During the same time her husband was ill with cancer and ultimately passed away.  Since that time she has had progressing symptoms of exertional shortness of breath without  chest pain or chest tightness.  Symptoms progressed to the point where she developed shortness of breath with very mild activity, orthopnea, and lower extremity edema a few months ago.  She was started on oral Lasix and symptoms have improved, but she continues to get short of breath and fatigued with moderate level activity.  This limits her daily activities considerably.  She was seen in follow-up recently and transthoracic echocardiogram revealed normal left ventricular systolic function with what was felt to be at least moderate if not severe mitral regurgitation.  She underwent TEE and diagnostic cardiac catheterization on June 20, 2018.  TEE revealed normal left ventricular systolic function with severe mitral regurgitation and severe left atrial enlargement.  Catheterization revealed severe multivessel coronary artery disease with mild pulmonary hypertension.  Cardiothoracic surgical consultation was requested.  The patient is recently widowed and lives alone in [place].  She has 1 adult son who lives in [place].  She lives a fairly sedentary lifestyle.  She states that she is limited primarily by exertional shortness of breath and fatigue.  She does not have problems walking.  She notes that her recovery following her spine surgery was very slow, but she now has only very mild pain in her back.  She denies any history of exertional chest pain or chest tightness.  She gets short of breath with moderate level activity.  She denies resting shortness of breath, PND, orthopnea, dizzy spells, or syncope.  She has had some orthopnea and lower extremity edema previously, but this has improved on oral diuretics.  She has some occasional tachypalpitations.  She has been chronically anticoagulated using Xarelto ever since 2016.  She has not had any bleeding complications nor history of TIA or stroke.  Patient is a 77 year old female with history of mitral regurgitation, long-standing persistent atrial  fibrillation on oral anticoagulation, hypertension, chronic diastolic congestive heart failure, previous spine surgery for management ofcompression fracture of the lumbar spine, and remote history of breast cancer who returns to the office today for follow-up with plans to proceed with surgery later this week for treatment of severe symptomatic primary mitral regurgitation, coronary artery disease, and persistent atrial fibrillation.  She was originally seen in consultation on July 07, 2018.  She returns the office today with tentative plans to proceed with surgery later this week.  She reports no new problems or complaints.  She still gets tired and short of breath with exertion but this has not gotten any worse.  She has not had any chest pain or chest tightness.  Appetite is fair.  Bowel function is stable.  She reports no abdominal complaints.   Past Medical History:  Diagnosis Date  . Breast cancer ([facility])   . Breast cancer of upper-outer quadrant of left female breast ([facility]) 02/02/2016  . Colitis   . Colon polyp 02/2012  . Coronary artery disease involving native coronary artery of native heart without angina pectoris 06/20/2018   multivessel CAD by catheterization  . Dupuytren contracture    bilateral hands  . Dysrhythmia    Atrial Fibrillation  . Family history of breast cancer   . Hypertension   . Mitral regurgitation 11/07/2013  . On continuous oral anticoagulation 08/22/2015   Started on Xarelto 08/12/2015   . Osteopenia   . Persistent atrial fibrillation ([facility]) 08/22/2015   Started late August or early September 2016  . Radiation Therapy 04/21/16-05/19/16   left breast 47.72 Gy, boosted to 10 Gy  . Wears glasses     Past Surgical History:  Procedure Laterality Date  . BREAST BIOPSY Left 01/28/2016  . BREAST LUMPECTOMY Left 02/23/2016  . BREAST LUMPECTOMY WITH RADIOACTIVE SEED LOCALIZATION Left 02/23/2016   Procedure: BREAST LUMPECTOMY WITH RADIOACTIVE SEED LOCALIZATION;  Surgeon:  [name], MD;  Location: [facility];  Service: General;  Laterality: Left;  . CARDIOVERSION N/A 10/02/2015   Procedure: CARDIOVERSION;  Surgeon: Jerline Pain, MD;  Location: [facility] ENDOSCOPY;  Service: Cardiovascular;  Laterality: N/A;  . COLONOSCOPY    . DUPUYTREN CONTRACTURE RELEASE  2001   leftx2  . [place]   right  . DUPUYTREN CONTRACTURE RELEASE Right 05/02/2014   Procedure: EXCISION DUPUYTRENS RIGHT PALMAR/SMALL ;  Surgeon: Cammie Sickle, MD;  Location: [facility];  Service: Orthopedics;  Laterality: Right;  . RIGHT/LEFT HEART CATH AND CORONARY ANGIOGRAPHY N/A 06/20/2018   Procedure: RIGHT/LEFT HEART CATH AND CORONARY ANGIOGRAPHY;  Surgeon: [name], MD;  Location: [facility];  Service: Cardiovascular;  Laterality: N/A;  . TEE WITHOUT CARDIOVERSION N/A 06/20/2018   Procedure: TRANSESOPHAGEAL ECHOCARDIOGRAM (TEE);  Surgeon: Pixie Casino, MD;  Location: [facility] ENDOSCOPY;  Service: Cardiovascular;  Laterality: N/A;  . TONSILLECTOMY  age 53    Family History  Problem Relation Age of Onset  . Cirrhosis Mother   . Breast cancer Mother 88  . Heart failure Father   . Heart attack Father   . Breast cancer Sister        early 97's  . Kidney Stones Sister        loss of  kidney due to stones  . Osteoporosis Neg Hx     Social History Social History   Tobacco Use  . Smoking status: Former Smoker    Packs/day: 1.00    Years: 20.00    Pack years: 20.00    Types: Cigarettes    Last attempt to quit: 11/22/1992    Years since quitting: 25.7  . Smokeless tobacco: Never Used  Substance Use Topics  . Alcohol use: Yes    Alcohol/week: 3.0 standard drinks    Types: 3 Standard drinks or equivalent per week    Comment: social  . Drug use: No    Prior to Admission medications   Medication Sig Start Date End Date Taking? Authorizing Provider  allopurinol (ZYLOPRIM) 100 MG tablet Take 200 mg by mouth daily.   06/21/13  Yes [provider]  digoxin (LANOXIN) 0.125 MG tablet Take 0.5 tablets (0.0625 mg total) by mouth daily. Take one half tablet (0.0625 mg ) daily 07/04/18  Yes Burtis Junes, NP  furosemide (LASIX) 20 MG tablet Take 1 tablet (20 mg total) by mouth daily. Patient taking differently: Take 20 mg by mouth daily as needed for fluid.  06/08/18 09/06/18 Yes Isaiah Serge, NP  LIALDA 1.2 G EC tablet Take 4.8 g by mouth daily.  07/12/13  Yes [provider]  potassium chloride SA (K-DUR,KLOR-CON) 20 MEQ tablet Take 1 tablet (20 mEq total) by mouth daily. Patient taking differently: Take 20 mEq by mouth every other day.  05/30/18  Yes Isaiah Serge, NP  simvastatin (ZOCOR) 20 MG tablet Take 20 mg by mouth daily.  08/27/13  Yes [provider]  XARELTO 20 MG TABS tablet TAKE 1 TABLET BY MOUTH  DAILY WITH SUPPER Patient taking differently: Take 20 mg by mouth daily.  11/01/17  Yes [name], MD    No Known Allergies    Review of Systems:              General:                      normal appetite, decreased energy, no weight gain, no weight loss, no fever             Cardiac:                       no chest pain with exertion, no chest pain at rest, +SOB with exertion, no resting SOB, no PND, + orthopnea, + palpitations, + arrhythmia, + atrial fibrillation, + LE edema, no dizzy spells, no syncope             Respiratory:                 + shortness of breath, no home oxygen, no productive cough, no dry cough, no bronchitis, no wheezing, no hemoptysis, no asthma, no pain with inspiration or cough, no sleep apnea, no CPAP at night             GI:                               no difficulty swallowing, no reflux, no frequent heartburn, no hiatal hernia, no abdominal pain, no constipation, no diarrhea, no hematochezia, no hematemesis, no melena             GU:  no dysuria,  no frequency, no urinary tract infection, no hematuria, no kidney  stones, no kidney disease             Vascular:                     no pain suggestive of claudication, no pain in feet, no leg cramps, no varicose veins, no DVT, no non-healing foot ulcer             Neuro:                         no stroke, no TIA's, no seizures, no headaches, no temporary blindness one eye,  no slurred speech, no peripheral neuropathy, no chronic pain, no instability of gait, no memory/cognitive dysfunction             Musculoskeletal:         + arthritis, no joint swelling, no myalgias, no difficulty walking, normal mobility              Skin:                            no rash, no itching, no skin infections, no pressure sores or ulcerations             Psych:                         no anxiety, + depression, no nervousness, no unusual recent stress             Eyes:                           no blurry vision, no floaters, no recent vision changes, + wears glasses or contacts             ENT:                            no hearing loss, no loose or painful teeth, no dentures, last saw dentist January 2019             Hematologic:               + easy bruising, no abnormal bleeding, no clotting disorder, no frequent epistaxis             Endocrine:                   no diabetes, does not check CBG's at home                           Physical Exam:              BP 130/60 (BP Location: Right Arm, Patient Position: Sitting, Cuff Size: Normal)   Pulse 60   Resp 18   Ht _0  (1.626 m)   Wt 126 lb (57.2 kg)   SpO2 96% Comment: RA  BMI 21.63 kg/m              General:                        well-appearing             HEENT:  Unremarkable              Neck:                           no JVD, no bruits, no adenopathy              Chest:                          clear to auscultation, symmetrical breath sounds, no wheezes, no rhonchi              CV:                              Irregular rate and rhythm, prominent grade III/VI holosystolic murmur               Abdomen:                    soft, non-tender, no masses              Extremities:                 warm, well-perfused, pulses diminished but palpable, no LE edema             Rectal/GU                   Deferred             Neuro:                         Grossly non-focal and symmetrical throughout             Skin:                            Clean and dry, no rashes, no breakdown   Diagnostic Tests:  Transthoracic Echocardiography  Patient:  Shamona, Wirtz MR #:    [id number] Study Date: 01/13/2015 Gender:   F Age:    32 Height:   160 cm Weight:   69.1 kg BSA:    1.77 m^2 Pt. Status: Room:  ATTENDING  Jenkins Rouge, M.D. SONOGRAPHER Cindy Hazy, RDCS ORDERING   Blenda Bridegroom, Lynnell Dike REFERRING  Smith Iii, [place], Outpatient  cc:  ------------------------------------------------------------------- LV EF: 60% -  65%  ------------------------------------------------------------------- Indications:   I34.0 Mitral Regurgitation.[name]  ------------------------------------------------------------------- History:  PMH: Acquired from the patient and from the patient&'s chart. Risk factors: Former tobacco use. Hypertension. Dyslipidemia.  ------------------------------------------------------------------- Study Conclusions  - Left ventricle: The cavity size was normal. Wall thickness was increased in a pattern of mild LVH. Systolic function was normal. The estimated ejection fraction was in the range of 60% to 65%. - Mitral valve: MAC Possible prolapse of posterior leaflet. MR appears mild and directed anteriorly Consider TEE if clinically indicated - Left atrium: The atrium was moderately dilated. - Atrial septum: No defect or patent foramen ovale was identified.  Transthoracic echocardiography. M-mode, complete 2D, spectral Doppler, and color Doppler. Birthdate: Patient birthdate: [birth year]. Age:  Patient is 77 yr old. Sex: Gender: female. BMI: 27 kg/m^2. Blood pressure:   124/68 Patient status: Outpatient. Study date: Study date: 01/13/2015. Study time: 01:01 PM. Location: [facility]  -------------------------------------------------------------------  ------------------------------------------------------------------- Left ventricle: The cavity size was  normal. Wall thickness was increased in a pattern of mild LVH. Systolic function was normal. The estimated ejection fraction was in the range of 60% to 65%.  ------------------------------------------------------------------- Aortic valve:  Mildly calcified leaflets. Doppler:  There was no stenosis.  ------------------------------------------------------------------- Aorta: The aorta was normal, not dilated, and non-diseased.  ------------------------------------------------------------------- Mitral valve: MAC Possible prolapse of posterior leaflet. MR appears mild and directed anteriorly Consider TEE if clinically indicated  ------------------------------------------------------------------- Left atrium: The atrium was moderately dilated.  ------------------------------------------------------------------- Atrial septum: No defect or patent foramen ovale was identified.  ------------------------------------------------------------------- Right ventricle: The cavity size was normal. Wall thickness was normal. Systolic function was normal.  ------------------------------------------------------------------- Pulmonic valve:  Structurally normal valve.  Cusp separation was normal. Doppler: Transvalvular velocity was within the normal range. There was trivial regurgitation.  ------------------------------------------------------------------- Tricuspid valve:  Doppler: There was mild regurgitation.  ------------------------------------------------------------------- Right atrium: The  atrium was normal in size.  ------------------------------------------------------------------- Pericardium: The pericardium was normal in appearance.  ------------------------------------------------------------------- Post procedure conclusions Ascending Aorta:  - The aorta was normal, not dilated, and non-diseased.  ------------------------------------------------------------------- Prepared and Electronically Authenticated by  Jenkins Rouge, M.D. 2016-02-22T14:00:16   Transthoracic Echocardiography  Patient: Denys, Labree MR #: [id number] Study Date: 06/08/2018 Gender: F Age: 28 Height: 162.6 cm Weight: 55.2 kg BSA: 1.58 m^2 Pt. Status: Room:  ATTENDING Mertie Moores, M.D. ORDERING Isaiah Serge REFERRING Cecilie Kicks R SONOGRAPHER [place], Outpatient  cc:  ------------------------------------------------------------------- LV EF: 60% - 65%  ------------------------------------------------------------------- Indications: I48.1 Atrial fibrillation.  ------------------------------------------------------------------- History: PMH: History of left breast cancer. Radiation therapy. Risk factors: Hypertension.  ------------------------------------------------------------------- Study Conclusions  - Left ventricle: The cavity size was normal. Wall thickness was increased in a pattern of mild LVH. Systolic function was normal. The estimated ejection fraction was in the range of 60% to 65%. Wall motion was normal; there were no regional wall motion abnormalities. - Mitral valve: Mildly to moderately calcified annulus. Mildly thickened, mildly calcified leaflets . There was moderate to severe regurgitation. - Left atrium: The atrium was massively dilated. - Pulmonary arteries: Systolic pressure was mildly increased. PA peak pressure: 33 mm Hg  (S).  ------------------------------------------------------------------- Study data: Comparison was made to the study of 01/19/2016. Study status: Routine. Procedure: The patient reported no pain pre or post test. Transthoracic echocardiography. Image quality was adequate. Study completion: There were no complications. Transthoracic echocardiography. M-mode, complete 2D, spectral Doppler, and color Doppler. Birthdate: Patient birthdate: 1941-05-03. Age: Patient is 77 yr old. Sex: Gender: female. BMI: 20.9 kg/m^2. Blood pressure: 91/49 Patient status: Outpatient. Study date: Study date: 06/08/2018. Study time: 03:14 PM. Location: [facility]  -------------------------------------------------------------------  ------------------------------------------------------------------- Left ventricle: The cavity size was normal. Wall thickness was increased in a pattern of mild LVH. Systolic function was normal. The estimated ejection fraction was in the range of 60% to 65%. Wall motion was normal; there were no regional wall motion abnormalities. The study was not technically sufficient to allow evaluation of LV diastolic dysfunction due to atrial fibrillation.  ------------------------------------------------------------------- Aortic valve: Mildly thickened leaflets. Doppler: There was no stenosis. There was no significant regurgitation.  ------------------------------------------------------------------- Aorta: Aortic root: The aortic root was normal in size. Ascending aorta: The ascending aorta was normal in size.  ------------------------------------------------------------------- Mitral valve: Mildly to moderately calcified annulus. Mildly thickened, mildly calcified leaflets . Doppler: There was moderate to severe regurgitation.  ------------------------------------------------------------------- Left atrium: The atrium was  massively dilated.  ------------------------------------------------------------------- Right ventricle: The cavity size was normal. Systolic function was normal.  -------------------------------------------------------------------  Pulmonic valve: The valve appears to be grossly normal. Doppler: There was trivial regurgitation.  ------------------------------------------------------------------- Tricuspid valve: The valve appears to be grossly normal. Doppler: There was mild regurgitation.  ------------------------------------------------------------------- Pulmonary artery: Systolic pressure was mildly increased.  ------------------------------------------------------------------- Right atrium: The atrium was normal in size.  ------------------------------------------------------------------- Pericardium: There was no pericardial effusion.  ------------------------------------------------------------------- Measurements  Left ventricle Value Reference LV ID, ED, PLAX chordal 45.3 mm 43 - 52 LV ID, ES, PLAX chordal 30.6 mm 23 - 38 LV fx shortening, PLAX chordal 32 % >=29 LV PW thickness, ED 12 mm --------- IVS/LV PW ratio, ED 1.12 <=1.3  Ventricular septum Value Reference IVS thickness, ED 13.4 mm ---------  LVOT Value Reference LVOT ID, S 19 mm --------- LVOT area 2.84 cm^2 ---------  Aorta Value Reference Aortic root ID, ED 29 mm ---------  Left atrium Value Reference LA ID, A-P, ES 53 mm --------- LA  ID/bsa, A-P (H) 3.36 cm/m^2 <=2.2 LA volume, S 178 ml --------- LA volume/bsa, S 112.7 ml/m^2 --------- LA volume, ES, 1-p A4C 174 ml --------- LA volume/bsa, ES, 1-p A4C 110.2 ml/m^2 --------- LA volume, ES, 1-p A2C 180 ml --------- LA volume/bsa, ES, 1-p A2C 114 ml/m^2 ---------  Mitral valve Value Reference Aliasing velocity, MR PISA 38.5 cm/s --------- Mitral regurg PISA radius 6 mm --------- Mitral regurg VTI, PISA 127 cm --------- Mitral ERO, PISA 0.2 cm^2 --------- Mitral regurg volume, PISA 25 ml ---------  Pulmonary arteries Value Reference PA pressure, S, DP (H) 33 mm Hg <=30  Tricuspid valve Value Reference Tricuspid regurg peak velocity 248 cm/s --------- Tricuspid peak RV-RA gradient 25 mm Hg ---------  Systemic veins Value Reference Estimated CVP 8 mm Hg ---------  Right ventricle Value Reference RV pressure, S, DP (H) 33 mm Hg <=30  Legend: (L) and (H) mark values outside specified reference range.  ------------------------------------------------------------------- Prepared and Electronically Authenticated by  Mertie Moores, M.D. 2019-07-18T17:00:56   Transesophageal Echocardiography  Patient: Julicia, Krieger MR #: [id number] Study Date: 06/20/2018 Gender: F Age: 20 Height: 162.6 cm Weight: 55.3 kg BSA: 1.58 m^2 Pt. Status: Room:  Toni Arthurs ADMITTING Lyman Bishop  MD [place] MD PERFORMING Lyman Bishop MD SONOGRAPHER Johny Chess, RDCS, CCT  cc:  ------------------------------------------------------------------- LV EF: 60% - 65%  ------------------------------------------------------------------- Indications: Mitral regurgitation 424.0.  ------------------------------------------------------------------- Study Conclusions  - Left ventricle: The cavity size was normal. There was mild concentric hypertrophy. Systolic function was normal. The estimated ejection fraction was in the range of 60% to 65%. Wall motion was normal; there were no regional wall motion abnormalities. - Aorta: Mild atheromatous disease. - Mitral valve: Severe regurgitation. Degenerate leafelts with mild posterior leaflet prolapse - no annular dilitation. Effective regurgitant orifice (PISA): 0.82 cm^2. Regurgitant volume (PISA): 113 ml. - Left atrium: Massively dilated. - Pulmonary veins: No anomaly. Reversal of flow noted in the LUPV. - Right atrium: No evidence of thrombus in the atrial cavity or appendage. - Atrial septum: No defect or patent foramen ovale was identified.  Impressions:  - Severe mitral regurgitation. No LAA thrombus. Massive LAE. LVEF 60-65%.  ------------------------------------------------------------------- Study data: Study status: Routine. Consent: The risks, benefits, and alternatives to the procedure were explained to the patient and informed consent was obtained. Procedure: Initial setup. The patient was brought to the laboratory. Surface ECG leads were monitored. Sedation. Conscious sedation was administered by cardiology staff. Transesophageal echocardiography. An adult multiplane transesophageal probe was inserted by the attending cardiologistwithout difficulty. Image quality was adequate. Study completion: The patient  tolerated the procedure well. There  were no complications. Administered medications: Midazolam, 72m, IV. Fentanyl, 210m, IV. Diagnostic transesophageal echocardiography. 2D and color Doppler. Birthdate: Patient birthdate: [id number]: Patient is [age]r old. Sex: Gender: female. BMI: 20.9 kg/m^2. Blood pressure: 122/88 Patient status: Outpatient. Study date: Study date: 06/20/2018. Study time: 08:14 AM. Location: Endoscopy.  -------------------------------------------------------------------  ------------------------------------------------------------------- Left ventricle: The cavity size was normal. There was mild concentric hypertrophy. Systolic function was normal. The estimated ejection fraction was in the range of 60% to 65%. Wall motion was normal; there were no regional wall motion abnormalities.  ------------------------------------------------------------------- Aortic valve: Trileaflet. Doppler: There was no regurgitation.  ------------------------------------------------------------------- Aorta: Mild atheromatous disease.  ------------------------------------------------------------------- Mitral valve: Severe regurgitation. Degenerate leafelts with mild posterior leaflet prolapse - no annular dilitation.  ------------------------------------------------------------------- Left atrium: Massively dilated.  ------------------------------------------------------------------- Atrial septum: No defect or patent foramen ovale was identified.  ------------------------------------------------------------------- Pulmonary veins: No anomaly. Reversal of flow noted in the LUPV.  ------------------------------------------------------------------- Right ventricle: The cavity size was normal. Wall thickness was normal. Systolic function was normal.  ------------------------------------------------------------------- Pulmonic valve: Doppler: There was  trivial regurgitation.  ------------------------------------------------------------------- Tricuspid valve: Doppler: There was mild regurgitation.  ------------------------------------------------------------------- Pulmonary artery: The main pulmonary artery was normal-sized.  ------------------------------------------------------------------- Right atrium: The atrium was normal in size. No evidence of thrombus in the atrial cavity or appendage.  ------------------------------------------------------------------- Pericardium: There was no pericardial effusion.  ------------------------------------------------------------------- Post procedure conclusions Ascending Aorta:  - Mild atheromatous disease.  ------------------------------------------------------------------- Measurements  Mitral valve Value Mitral maximal regurg velocity, PISA 568 cm/s Mitral regurg VTI, PISA 138 cm Mitral ERO, PISA 0.82 cm^2 Mitral regurg volume, PISA 113 ml  Legend: (L) and (H) mark values outside specified reference range.  ------------------------------------------------------------------- Prepared and Electronically Authenticated by  KeLyman BishopD 2019-07-30T08:38:21     RIGHT/LEFT HEART CATH AND CORONARY ANGIOGRAPHY  Conclusion    Severe mitral regurgitation with marked enlargement of the left atrium.  Mild pulmonary hypertension with mean pulmonary arterial pressure 28 mmHg. Phasic pressure 45/16 mmHg  Three-vessel coronary calcification with significant greater than 80% distal circumflex, 90% first diagonal ostium, and 80 to 90% mid LAD.  Normal LV systolic function with EF 50 to 55%. LVEDP 11 mmHg  Cardiac output 4.3 L/min/cardiac index 2.7 L/min/m  RECOMMENDATIONS:   Referred to Dr. C.Remus Loffleror consideration of surgical mitral valve  repair left atrial appendage ligation, consideration of Maze procedure, and coronary revascularization.    Recommend to resume Rivaroxaban, at currently prescribed dose and frequency, on 06/20/18 at 9:30 PM. Concurrent antiplatelet therapy not recommended.  Indications   Acute on chronic combined systolic and diastolic CHF (congestive heart failure) (HCC) [I50.43 (ICD-10-CM)]  Non-rheumatic mitral regurgitation [I34.0 (ICD-10-CM)]  CAD in native artery [I25.10 (ICD-10-CM)]  Procedural Details/Technique   Technical Details The right radial area was sterilely prepped and draped. Intravenous sedation with Versed and fentanyl was administered. 1% Xylocaine was infiltrated to achieve local analgesia. Using real-time vascular ultrasound, a double wall stick with an angiocath was utilized to obtain intra-arterial access. A VUS image was saved for the permanent record.The modified Seldinger technique was used to place a 48F " Slender" sheath in the right radial artery. Weight based heparin was administered. Coronary angiography was done using 5 F catheters. Right coronary angiography was performed with a JR4. Left ventricular hemodymic recordings and angiography was done using the JR 4 catheter and hand injection. Left coronary angiography was performed with a JL 3.5 cm.  Right heart catheterization was performed by exchanging a previously  placed antecubital IV angio-cath for a 5 French Slender sheath. 1% Xylocaine was used to locally nesthetize the area around the IV site. The IV catheter was wired using an .018 guidewire. The modified Seldinger technique was used to place the 5 [place] sheath. Double glove technique was used to enhance sterility. After sheath insertion, right heart cath was performed using a 5 French balloon tipped catheter and fluoroscopic guidance. Pressures were recorded in each chamber and in the pulmonary capillary wedge position.. The main pulmonary artery O2 saturation was sampled.    Hemostasis was achieved using a pneumatic band.  During this procedure the patient is administered a total of Versed 1 mg and Fentanyl 25 mg to achieve and maintain moderate conscious sedation. The patient's heart rate, blood pressure, and oxygen saturation are monitored continuously during the procedure. The period of conscious sedation is 36 minutes, of which I was present face-to-face 100% of this time.   Estimated blood loss <50 mL.  During this procedure the patient was administered the following to achieve and maintain moderate conscious sedation: Versed 1 mg, Fentanyl 25 mcg, while the patient's heart rate, blood pressure, and oxygen saturation were continuously monitored. The period of conscious sedation was 36 minutes, of which I was present face-to-face 100% of this time.  Coronary Findings   Diagnostic  Dominance: Right  Left Anterior Descending  Prox LAD-1 lesion 65% stenosed  Prox LAD-1 lesion is 65% stenosed.  Prox LAD-2 lesion 90% stenosed  Prox LAD-2 lesion is 90% stenosed.  First Diagonal Branch  Ost 1st Diag lesion 90% stenosed  Ost 1st Diag lesion is 90% stenosed.  Left Circumflex  Mid Cx lesion 85% stenosed  Mid Cx lesion is 85% stenosed.  First Obtuse Marginal Branch  Vessel is small in size.  Second Obtuse Marginal Branch  Vessel is large in size.  Ost 2nd Mrg to 2nd Mrg lesion 50% stenosed  Ost 2nd Mrg to 2nd Mrg lesion is 50% stenosed.  Right Coronary Artery  Ost RCA to Mid RCA lesion 30% stenosed  Ost RCA to Mid RCA lesion is 30% stenosed.  Intervention   No interventions have been documented.  Right Heart   Right Heart Pressures Hemodynamic findings consistent with mild pulmonary hypertension and mitral valve regurgitation. LV EDP is normal.  Wall Motion        Resting               Left Heart   Left Ventricle The left ventricle is dilated. The left ventricular systolic function is normal. LV end diastolic pressure is normal. The  left ventricular ejection fraction is 50-55% by visual estimate. No regional wall motion abnormalities. There is moderate to severe mitral regurgitation.  Mitral Valve There is severe (4+) mitral regurgitation. V wave spiking to 42 mmHg. Mean pulmonary wedge pressure 21 mmHg.  Coronary Diagrams   Diagnostic Diagram         CT ANGIOGRAPHY CHEST, ABDOMEN AND PELVIS  TECHNIQUE: Multidetector CT imaging through the chest, abdomen and pelvis was performed using the standard protocol during bolus administration of intravenous contrast. Multiplanar reconstructed images and MIPs were obtained and reviewed to evaluate the vascular anatomy.  CONTRAST: 66m ISOVUE-370 IOPAMIDOL (ISOVUE-370) INJECTION 76%  COMPARISON: None.  FINDINGS: CTA CHEST FINDINGS  Cardiovascular: Atherosclerosis of thoracic aorta is noted without aneurysm or dissection. Great vessels are widely patent without significant stenosis. Mild cardiomegaly is noted. Enlarged left atrium is noted. Mild coronary artery calcifications are noted. No pericardial effusion is noted.  Mediastinum/Nodes: No enlarged mediastinal, hilar, or axillary lymph nodes. Thyroid gland, trachea, and esophagus demonstrate no significant findings.  Lungs/Pleura: Lungs are clear. No pleural effusion or pneumothorax.  Musculoskeletal: No chest wall abnormality. No acute or significant osseous findings.  Review of the MIP images confirms the above findings.  CTA ABDOMEN AND PELVIS FINDINGS  VASCULAR  Aorta: Atherosclerosis of abdominal aorta is noted without aneurysm or dissection.  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 No thrombosis is noted.  SMA: Patent without evidence of aneurysm, dissection, vasculitis or significant stenosis.  Renals: Both renal arteries are patent without evidence of aneurysm, dissection, vasculitis, fibromuscular dysplasia or  significant stenosis.  IMA: Patent without evidence of aneurysm, dissection, vasculitis or significant stenosis.  Inflow: Patent without evidence of aneurysm, dissection, vasculitis or significant stenosis.  Veins: No obvious venous abnormality within the limitations of this arterial phase study.  Review of the MIP images confirms the above findings.  NON-VASCULAR  Hepatobiliary: No focal liver abnormality is seen. No gallstones, gallbladder wall thickening, or biliary dilatation.  Pancreas: Unremarkable. No pancreatic ductal dilatation or surrounding inflammatory changes.  Spleen: Mild splenomegaly is noted.  Adrenals/Urinary Tract: Adrenal glands are unremarkable. Kidneys are normal, without renal calculi, focal lesion, or hydronephrosis. Bladder is unremarkable.  Stomach/Bowel: Stomach is within normal limits. Appendix appears normal. No evidence of bowel wall thickening, distention, or inflammatory changes.  Lymphatic: No significant adenopathy is noted.  Reproductive: Uterus and bilateral adnexa are unremarkable.  Other: No abdominal wall hernia or abnormality. No abdominopelvic ascites.  Musculoskeletal: Old L2 and L3 severe compression fractures are noted. No acute abnormality is noted.  Review of the MIP images confirms the above findings.  IMPRESSION: No evidence of thoracic or abdominal aortic aneurysm or dissection.  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 No other evidence of significant mesenteric or renal artery stenosis.  Mild cardiomegaly is noted with left atrial enlargement.  Mild splenomegaly.  Old L2 and L3 compression fractures are noted.  Aortic Atherosclerosis (ICD10-I70.0).   Electronically Signed By: Marijo Conception, M.D. 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 She describes stable symptoms of exertional shortness of breath and fatigue consistent with chronic diastolic congestive heart failure, [place] Heart Association functional class IIb. Symptoms reportedly had been worse previously but have improved somewhat on oral diuretic therapy. I have personally reviewed the patient's recent echocardiograms and diagnostic cardiac catheterization. The patient has severe mitral regurgitation. The functional anatomy of the mitral valve was not well elucidated on recent TEE. There is definitely annular dilatation. For the most part there is seems to be normal leaflet mobility although in some views there is suggestion of a partially calcified and/or restricted segment of the posterior leaflet. There is no clearly convincing evidence for prolapse. Findings are not suggestive of rheumatic disease. There is severe left atrial enlargement. Left ventricular function appears normal. Diagnostic cardiac catheterization reveals multivessel coronary artery disease with significant stenosis of the left anterior descending coronary artery and the left circumflex coronary artery. Pulmonary artery pressures were mildly elevated.  I agree the patient needs surgical intervention. I suspect her mitral valve should be repairable, although the functional anatomy of the valve was not well identified on recent TEE. Given the severity of coronary artery disease she should undergo coronary  artery bypass grafting at the time of surgery. She might benefit from concomitant Maze procedure. Overall risks associated with surgery should be acceptably low although will be slightly elevated because of the patient's age and somewhat limited physical activity.   Plan:  The patientwas againcounseled at length regarding the indications, risks and  potential benefits of mitral valve repair. The rationale for elective surgery has been explained, including a comparison between surgery and continued medical therapy with close follow-up. The likelihood of successful and durable valve repair has been discussed with particular reference to the findings of their recent echocardiogram. Based upon these findings and previous experience, I have quoted them a greater than 90percent likelihood of successful valve repair. In the unlikely event that their valve cannot be successfully repaired, we discussed the possibility of replacing the mitral valve using a mechanical prosthesis with the attendant need for long-term anticoagulation versus the alternative of replacing it using a bioprosthetic tissue valve with its potential for late structural valve deterioration and failure, depending upon the patient's longevity. The patient specifically requests that if the mitral valve must be replaced that it be done using a bioprosthetic tissuevalve. The need for concomitant coronary artery bypass grafting was discussed as well as implications regarding the surgical approach and long-term prognosis.The relative risks and benefits of performing a maze procedure at the time of their surgery was discussed at length, including the expected likelihood of long term freedom from recurrent symptomatic atrial fibrillation and/or atrial flutter.Expectations for her postoperative convalescence have been discussed. She understands that because she lives alone she may need temporary placement in a skilled nursing facility for rehabilitation following surgery.   The patient understands and accepts all potential risks of surgery including but not limited to risk of death, stroke or other neurologic complication, myocardial infarction, congestive heart failure, respiratory failure, renal failure, bleeding requiring transfusion and/or reexploration, arrhythmia, infection or other  wound complications, pneumonia, pleural and/or pericardial effusion, pulmonary embolus, aortic dissection or other major vascular complication, or delayed complications related to valve repair or replacement including but not limited to structural valve deterioration and failure, thrombosis, embolization, endocarditis, or paravalvular leak. All of her questions have been answered.     Valentina Gu. Roxy Manns, MD 08/07/2018 4:19 PM

## 2018-08-10 ENCOUNTER — Ambulatory Visit (HOSPITAL_COMMUNITY): Payer: Medicare Other

## 2018-08-10 ENCOUNTER — Inpatient Hospital Stay (HOSPITAL_COMMUNITY): Payer: Medicare Other | Admitting: Physician Assistant

## 2018-08-10 ENCOUNTER — Encounter (HOSPITAL_COMMUNITY): Payer: Self-pay | Admitting: *Deleted

## 2018-08-10 ENCOUNTER — Inpatient Hospital Stay (HOSPITAL_COMMUNITY): Payer: Medicare Other | Admitting: Certified Registered Nurse Anesthetist

## 2018-08-10 ENCOUNTER — Inpatient Hospital Stay (HOSPITAL_COMMUNITY)
Admission: RE | Admit: 2018-08-10 | Discharge: 2018-08-17 | DRG: 220 | Disposition: A | Discharge status: Skilled Nursing Facility | Payer: Medicare Other | Source: Ambulatory Visit | Attending: Thoracic Surgery (Cardiothoracic Vascular Surgery) | Admitting: Thoracic Surgery (Cardiothoracic Vascular Surgery)

## 2018-08-10 ENCOUNTER — Encounter (HOSPITAL_COMMUNITY)
Admission: RE | Disposition: A | Discharge status: Skilled Nursing Facility | Payer: Self-pay | Source: Ambulatory Visit | Attending: Thoracic Surgery (Cardiothoracic Vascular Surgery)

## 2018-08-10 ENCOUNTER — Inpatient Hospital Stay (HOSPITAL_COMMUNITY): Payer: Medicare Other

## 2018-08-10 DIAGNOSIS — I272 Pulmonary hypertension, unspecified: Secondary | ICD-10-CM | POA: Diagnosis present

## 2018-08-10 DIAGNOSIS — J9811 Atelectasis: Secondary | ICD-10-CM

## 2018-08-10 DIAGNOSIS — Z87891 Personal history of nicotine dependence: Secondary | ICD-10-CM

## 2018-08-10 DIAGNOSIS — Z7901 Long term (current) use of anticoagulants: Secondary | ICD-10-CM | POA: Diagnosis not present

## 2018-08-10 DIAGNOSIS — E785 Hyperlipidemia, unspecified: Secondary | ICD-10-CM | POA: Diagnosis present

## 2018-08-10 DIAGNOSIS — Z9689 Presence of other specified functional implants: Secondary | ICD-10-CM

## 2018-08-10 DIAGNOSIS — D62 Acute posthemorrhagic anemia: Secondary | ICD-10-CM | POA: Diagnosis not present

## 2018-08-10 DIAGNOSIS — I484 Atypical atrial flutter: Secondary | ICD-10-CM | POA: Diagnosis present

## 2018-08-10 DIAGNOSIS — I10 Essential (primary) hypertension: Secondary | ICD-10-CM | POA: Diagnosis present

## 2018-08-10 DIAGNOSIS — Z951 Presence of aortocoronary bypass graft: Secondary | ICD-10-CM

## 2018-08-10 DIAGNOSIS — M810 Age-related osteoporosis without current pathological fracture: Secondary | ICD-10-CM | POA: Diagnosis present

## 2018-08-10 DIAGNOSIS — Z853 Personal history of malignant neoplasm of breast: Secondary | ICD-10-CM

## 2018-08-10 DIAGNOSIS — I5032 Chronic diastolic (congestive) heart failure: Secondary | ICD-10-CM | POA: Diagnosis present

## 2018-08-10 DIAGNOSIS — D6959 Other secondary thrombocytopenia: Secondary | ICD-10-CM | POA: Diagnosis present

## 2018-08-10 DIAGNOSIS — I4891 Unspecified atrial fibrillation: Secondary | ICD-10-CM

## 2018-08-10 DIAGNOSIS — Z8679 Personal history of other diseases of the circulatory system: Secondary | ICD-10-CM

## 2018-08-10 DIAGNOSIS — I110 Hypertensive heart disease with heart failure: Secondary | ICD-10-CM | POA: Diagnosis present

## 2018-08-10 DIAGNOSIS — I2510 Atherosclerotic heart disease of native coronary artery without angina pectoris: Secondary | ICD-10-CM | POA: Diagnosis present

## 2018-08-10 DIAGNOSIS — I340 Nonrheumatic mitral (valve) insufficiency: Principal | ICD-10-CM | POA: Diagnosis present

## 2018-08-10 DIAGNOSIS — I482 Chronic atrial fibrillation: Secondary | ICD-10-CM

## 2018-08-10 DIAGNOSIS — Z79899 Other long term (current) drug therapy: Secondary | ICD-10-CM | POA: Diagnosis not present

## 2018-08-10 DIAGNOSIS — I481 Persistent atrial fibrillation: Secondary | ICD-10-CM | POA: Diagnosis present

## 2018-08-10 DIAGNOSIS — I4819 Other persistent atrial fibrillation: Secondary | ICD-10-CM | POA: Diagnosis present

## 2018-08-10 DIAGNOSIS — Z09 Encounter for follow-up examination after completed treatment for conditions other than malignant neoplasm: Secondary | ICD-10-CM

## 2018-08-10 DIAGNOSIS — Z95828 Presence of other vascular implants and grafts: Secondary | ICD-10-CM

## 2018-08-10 DIAGNOSIS — Z9889 Other specified postprocedural states: Secondary | ICD-10-CM

## 2018-08-10 DIAGNOSIS — I4892 Unspecified atrial flutter: Secondary | ICD-10-CM | POA: Diagnosis not present

## 2018-08-10 HISTORY — DX: Personal history of other diseases of the circulatory system: Z86.79

## 2018-08-10 HISTORY — PX: CLIPPING OF ATRIAL APPENDAGE: SHX5773

## 2018-08-10 HISTORY — PX: MITRAL VALVE REPAIR: SHX2039

## 2018-08-10 HISTORY — PX: CORONARY ARTERY BYPASS GRAFT: SHX141

## 2018-08-10 HISTORY — DX: Other specified postprocedural states: Z98.890

## 2018-08-10 HISTORY — DX: Atypical atrial flutter: I48.4

## 2018-08-10 HISTORY — DX: Presence of aortocoronary bypass graft: Z95.1

## 2018-08-10 HISTORY — PX: MAZE: SHX5063

## 2018-08-10 HISTORY — PX: TEE WITHOUT CARDIOVERSION: SHX5443

## 2018-08-10 LAB — POCT I-STAT 3, ART BLOOD GAS (G3+)
ACID-BASE DEFICIT: 5 mmol/L — AB (ref 0.0–2.0)
ACID-BASE DEFICIT: 7 mmol/L — AB (ref 0.0–2.0)
Acid-base deficit: 4 mmol/L — ABNORMAL HIGH (ref 0.0–2.0)
Acid-base deficit: 4 mmol/L — ABNORMAL HIGH (ref 0.0–2.0)
BICARBONATE: 21.5 mmol/L (ref 20.0–28.0)
BICARBONATE: 21.5 mmol/L (ref 20.0–28.0)
BICARBONATE: 18 mmol/L — AB (ref 20.0–28.0)
Bicarbonate: 21.8 mmol/L (ref 20.0–28.0)
O2 SAT: 100 %
O2 SAT: 94 %
O2 SAT: 98 %
O2 Saturation: 98 %
PCO2 ART: 42.3 mmHg (ref 32.0–48.0)
PH ART: 7.338 — AB (ref 7.350–7.450)
PH ART: 7.348 — AB (ref 7.350–7.450)
PO2 ART: 125 mmHg — AB (ref 83.0–108.0)
Patient temperature: 35.3
TCO2: 23 mmol/L (ref 22–32)
TCO2: 23 mmol/L (ref 22–32)
TCO2: 19 mmol/L — ABNORMAL LOW (ref 22–32)
TCO2: 23 mmol/L (ref 22–32)
pCO2 arterial: 40.1 mmHg (ref 32.0–48.0)
pCO2 arterial: 32.8 mmHg (ref 32.0–48.0)
pCO2 arterial: 42.9 mmHg (ref 32.0–48.0)
pH, Arterial: 7.305 — ABNORMAL LOW (ref 7.350–7.450)
pH, Arterial: 7.314 — ABNORMAL LOW (ref 7.350–7.450)
pO2, Arterial: 309 mmHg — ABNORMAL HIGH (ref 83.0–108.0)
pO2, Arterial: 116 mmHg — ABNORMAL HIGH (ref 83.0–108.0)
pO2, Arterial: 75 mmHg — ABNORMAL LOW (ref 83.0–108.0)

## 2018-08-10 LAB — PROTIME-INR
INR: 1.97
INR: 1.7
PROTHROMBIN TIME: 22.3 s — AB (ref 11.4–15.2)
PROTHROMBIN TIME: 19.8 s — AB (ref 11.4–15.2)

## 2018-08-10 LAB — POCT I-STAT 7, (LYTES, BLD GAS, ICA,H+H)
ACID-BASE DEFICIT: 3 mmol/L — AB (ref 0.0–2.0)
BICARBONATE: 22.1 mmol/L (ref 20.0–28.0)
Calcium, Ion: 0.98 mmol/L — ABNORMAL LOW (ref 1.15–1.40)
HCT: 27 % — ABNORMAL LOW (ref 36.0–46.0)
HEMOGLOBIN: 9.2 g/dL — AB (ref 12.0–15.0)
O2 SAT: 99 %
PCO2 ART: 39 mmHg (ref 32.0–48.0)
PH ART: 7.361 (ref 7.350–7.450)
PO2 ART: 121 mmHg — AB (ref 83.0–108.0)
Potassium: 4 mmol/L (ref 3.5–5.1)
Sodium: 146 mmol/L — ABNORMAL HIGH (ref 135–145)
TCO2: 23 mmol/L (ref 22–32)

## 2018-08-10 LAB — PREPARE RBC (CROSSMATCH)

## 2018-08-10 LAB — POCT I-STAT, CHEM 8
BUN: 11 mg/dL (ref 8–23)
BUN: 10 mg/dL (ref 8–23)
BUN: 12 mg/dL (ref 8–23)
BUN: 11 mg/dL (ref 8–23)
BUN: 10 mg/dL (ref 8–23)
BUN: 10 mg/dL (ref 8–23)
BUN: 10 mg/dL (ref 8–23)
BUN: 10 mg/dL (ref 8–23)
CALCIUM ION: 0.98 mmol/L — AB (ref 1.15–1.40)
CALCIUM ION: 1.23 mmol/L (ref 1.15–1.40)
CALCIUM ION: 0.97 mmol/L — AB (ref 1.15–1.40)
CALCIUM ION: 0.91 mmol/L — AB (ref 1.15–1.40)
CALCIUM ION: 0.95 mmol/L — AB (ref 1.15–1.40)
CALCIUM ION: 1.09 mmol/L — AB (ref 1.15–1.40)
CHLORIDE: 105 mmol/L (ref 98–111)
CHLORIDE: 106 mmol/L (ref 98–111)
CHLORIDE: 109 mmol/L (ref 98–111)
CREATININE: 0.4 mg/dL — AB (ref 0.44–1.00)
CREATININE: 0.4 mg/dL — AB (ref 0.44–1.00)
CREATININE: 0.4 mg/dL — AB (ref 0.44–1.00)
CREATININE: 0.4 mg/dL — AB (ref 0.44–1.00)
CREATININE: 0.4 mg/dL — AB (ref 0.44–1.00)
CREATININE: 0.4 mg/dL — AB (ref 0.44–1.00)
CREATININE: 0.4 mg/dL — AB (ref 0.44–1.00)
Calcium, Ion: 1.21 mmol/L (ref 1.15–1.40)
Calcium, Ion: 1.05 mmol/L — ABNORMAL LOW (ref 1.15–1.40)
Chloride: 109 mmol/L (ref 98–111)
Chloride: 109 mmol/L (ref 98–111)
Chloride: 104 mmol/L (ref 98–111)
Chloride: 107 mmol/L (ref 98–111)
Chloride: 109 mmol/L (ref 98–111)
Creatinine, Ser: 0.5 mg/dL (ref 0.44–1.00)
GLUCOSE: 100 mg/dL — AB (ref 70–99)
GLUCOSE: 94 mg/dL (ref 70–99)
GLUCOSE: 134 mg/dL — AB (ref 70–99)
GLUCOSE: 148 mg/dL — AB (ref 70–99)
Glucose, Bld: 106 mg/dL — ABNORMAL HIGH (ref 70–99)
Glucose, Bld: 142 mg/dL — ABNORMAL HIGH (ref 70–99)
Glucose, Bld: 137 mg/dL — ABNORMAL HIGH (ref 70–99)
Glucose, Bld: 153 mg/dL — ABNORMAL HIGH (ref 70–99)
HCT: 21 % — ABNORMAL LOW (ref 36.0–46.0)
HCT: 25 % — ABNORMAL LOW (ref 36.0–46.0)
HCT: 23 % — ABNORMAL LOW (ref 36.0–46.0)
HCT: 25 % — ABNORMAL LOW (ref 36.0–46.0)
HCT: 20 % — ABNORMAL LOW (ref 36.0–46.0)
HCT: 25 % — ABNORMAL LOW (ref 36.0–46.0)
HEMATOCRIT: 26 % — AB (ref 36.0–46.0)
HEMATOCRIT: 28 % — AB (ref 36.0–46.0)
HEMOGLOBIN: 8.8 g/dL — AB (ref 12.0–15.0)
HEMOGLOBIN: 9.5 g/dL — AB (ref 12.0–15.0)
HEMOGLOBIN: 7.8 g/dL — AB (ref 12.0–15.0)
HEMOGLOBIN: 6.8 g/dL — AB (ref 12.0–15.0)
HEMOGLOBIN: 8.5 g/dL — AB (ref 12.0–15.0)
Hemoglobin: 7.1 g/dL — ABNORMAL LOW (ref 12.0–15.0)
Hemoglobin: 8.5 g/dL — ABNORMAL LOW (ref 12.0–15.0)
Hemoglobin: 8.5 g/dL — ABNORMAL LOW (ref 12.0–15.0)
POTASSIUM: 4.1 mmol/L (ref 3.5–5.1)
POTASSIUM: 4.4 mmol/L (ref 3.5–5.1)
Potassium: 4.1 mmol/L (ref 3.5–5.1)
Potassium: 4.3 mmol/L (ref 3.5–5.1)
Potassium: 5 mmol/L (ref 3.5–5.1)
Potassium: 4.9 mmol/L (ref 3.5–5.1)
Potassium: 5.3 mmol/L — ABNORMAL HIGH (ref 3.5–5.1)
Potassium: 3.9 mmol/L (ref 3.5–5.1)
SODIUM: 139 mmol/L (ref 135–145)
SODIUM: 142 mmol/L (ref 135–145)
Sodium: 140 mmol/L (ref 135–145)
Sodium: 140 mmol/L (ref 135–145)
Sodium: 141 mmol/L (ref 135–145)
Sodium: 140 mmol/L (ref 135–145)
Sodium: 139 mmol/L (ref 135–145)
Sodium: 141 mmol/L (ref 135–145)
TCO2: 24 mmol/L (ref 22–32)
TCO2: 23 mmol/L (ref 22–32)
TCO2: 24 mmol/L (ref 22–32)
TCO2: 27 mmol/L (ref 22–32)
TCO2: 25 mmol/L (ref 22–32)
TCO2: 26 mmol/L (ref 22–32)
TCO2: 25 mmol/L (ref 22–32)
TCO2: 23 mmol/L (ref 22–32)

## 2018-08-10 LAB — APTT
APTT: 40 s — AB (ref 24–36)
aPTT: 44 seconds — ABNORMAL HIGH (ref 24–36)

## 2018-08-10 LAB — CBC
HCT: 32.3 % — ABNORMAL LOW (ref 36.0–46.0)
HEMATOCRIT: 25.1 % — AB (ref 36.0–46.0)
HEMATOCRIT: 27.3 % — AB (ref 36.0–46.0)
Hemoglobin: 8.5 g/dL — ABNORMAL LOW (ref 12.0–15.0)
Hemoglobin: 11.2 g/dL — ABNORMAL LOW (ref 12.0–15.0)
Hemoglobin: 9.4 g/dL — ABNORMAL LOW (ref 12.0–15.0)
MCH: 33.7 pg (ref 26.0–34.0)
MCH: 34.3 pg — ABNORMAL HIGH (ref 26.0–34.0)
MCH: 34.4 pg — ABNORMAL HIGH (ref 26.0–34.0)
MCHC: 33.9 g/dL (ref 30.0–36.0)
MCHC: 34.7 g/dL (ref 30.0–36.0)
MCHC: 34.4 g/dL (ref 30.0–36.0)
MCV: 99.6 fL (ref 78.0–100.0)
MCV: 98.8 fL (ref 78.0–100.0)
MCV: 100 fL (ref 78.0–100.0)
PLATELETS: 62 10*3/uL — AB (ref 150–400)
Platelets: 81 10*3/uL — ABNORMAL LOW (ref 150–400)
Platelets: 130 10*3/uL — ABNORMAL LOW (ref 150–400)
RBC: 2.52 MIL/uL — ABNORMAL LOW (ref 3.87–5.11)
RBC: 3.27 MIL/uL — AB (ref 3.87–5.11)
RBC: 2.73 MIL/uL — ABNORMAL LOW (ref 3.87–5.11)
RDW: 19.1 % — AB (ref 11.5–15.5)
RDW: 19.4 % — AB (ref 11.5–15.5)
RDW: 20.5 % — AB (ref 11.5–15.5)
WBC: 6 10*3/uL (ref 4.0–10.5)
WBC: 6.1 10*3/uL (ref 4.0–10.5)
WBC: 7.1 10*3/uL (ref 4.0–10.5)

## 2018-08-10 LAB — POCT I-STAT 4, (NA,K, GLUC, HGB,HCT)
Glucose, Bld: 115 mg/dL — ABNORMAL HIGH (ref 70–99)
HCT: 29 % — ABNORMAL LOW (ref 36.0–46.0)
Hemoglobin: 9.9 g/dL — ABNORMAL LOW (ref 12.0–15.0)
POTASSIUM: 3.7 mmol/L (ref 3.5–5.1)
SODIUM: 144 mmol/L (ref 135–145)

## 2018-08-10 LAB — GLUCOSE, CAPILLARY
GLUCOSE-CAPILLARY: 76 mg/dL (ref 70–99)
GLUCOSE-CAPILLARY: 135 mg/dL — AB (ref 70–99)
GLUCOSE-CAPILLARY: 137 mg/dL — AB (ref 70–99)
GLUCOSE-CAPILLARY: 145 mg/dL — AB (ref 70–99)
GLUCOSE-CAPILLARY: 123 mg/dL — AB (ref 70–99)
Glucose-Capillary: 115 mg/dL — ABNORMAL HIGH (ref 70–99)

## 2018-08-10 LAB — FIBRINOGEN: Fibrinogen: 145 mg/dL — ABNORMAL LOW (ref 210–475)

## 2018-08-10 LAB — HEMOGLOBIN AND HEMATOCRIT, BLOOD
HCT: 23.6 % — ABNORMAL LOW (ref 36.0–46.0)
Hemoglobin: 8.2 g/dL — ABNORMAL LOW (ref 12.0–15.0)

## 2018-08-10 LAB — MAGNESIUM: Magnesium: 3.3 mg/dL — ABNORMAL HIGH (ref 1.7–2.4)

## 2018-08-10 LAB — PLATELET COUNT: Platelets: 50 10*3/uL — ABNORMAL LOW (ref 150–400)

## 2018-08-10 LAB — CREATININE, SERUM
Creatinine, Ser: 0.67 mg/dL (ref 0.44–1.00)
GFR calc Af Amer: >60 mL/min (ref >60)
GFR calc non Af Amer: >60 mL/min (ref >60)

## 2018-08-10 SURGERY — REPAIR, MITRAL VALVE
Anesthesia: General | Site: Chest

## 2018-08-10 MED ORDER — SODIUM CHLORIDE 0.9 % IV SOLN
INTRAVENOUS | Status: DC | PRN
  Administered 2018-08-10: 15 ug/min via INTRAVENOUS

## 2018-08-10 MED ORDER — LACTATED RINGERS IV SOLN
INTRAVENOUS | Status: DC | PRN
  Administered 2018-08-10: 07:00:00 via INTRAVENOUS

## 2018-08-10 MED ORDER — SODIUM CHLORIDE 0.9% FLUSH: 10.0000 mL | INTRAVENOUS | Status: DC | PRN

## 2018-08-10 MED ORDER — PHENYLEPHRINE 40 MCG/ML (10ML) SYRINGE FOR IV PUSH (FOR BLOOD PRESSURE SUPPORT)
PREFILLED_SYRINGE | INTRAVENOUS | Status: AC
  Filled 2018-08-10: qty 30

## 2018-08-10 MED ORDER — MIDAZOLAM HCL 5 MG/5ML IJ SOLN
INTRAMUSCULAR | Status: DC | PRN
  Administered 2018-08-10 (×6): 2 mg via INTRAVENOUS

## 2018-08-10 MED ORDER — PROTAMINE SULFATE 10 MG/ML IV SOLN
INTRAVENOUS | Status: DC | PRN
  Administered 2018-08-10: 180 mg via INTRAVENOUS

## 2018-08-10 MED ORDER — OXYCODONE HCL 5 MG PO TABS
5.0000 mg | ORAL_TABLET | ORAL | Status: DC | PRN
  Administered 2018-08-10 – 2018-08-12 (×7): 10 mg via ORAL
  Filled 2018-08-10 (×7): qty 2

## 2018-08-10 MED ORDER — PROTAMINE SULFATE 10 MG/ML IV SOLN
INTRAVENOUS | Status: AC
  Filled 2018-08-10: qty 25

## 2018-08-10 MED ORDER — MORPHINE SULFATE (PF) 2 MG/ML IV SOLN
1.0000 mg | INTRAVENOUS | Status: DC | PRN
  Administered 2018-08-10 (×3): 2 mg via INTRAVENOUS
  Filled 2018-08-10 (×3): qty 1
  Filled 2018-08-10: qty 2
  Filled 2018-08-10: qty 1

## 2018-08-10 MED ORDER — MIDAZOLAM HCL 10 MG/2ML IJ SOLN
INTRAMUSCULAR | Status: AC
  Filled 2018-08-10: qty 2

## 2018-08-10 MED ORDER — SODIUM CHLORIDE 0.9 % IV SOLN: INTRAVENOUS | Status: DC

## 2018-08-10 MED ORDER — CHLORHEXIDINE GLUCONATE 0.12 % MT SOLN
OROMUCOSAL | Status: AC
  Administered 2018-08-10: 15 mL via OROMUCOSAL
  Filled 2018-08-10: qty 15

## 2018-08-10 MED ORDER — MIDAZOLAM HCL 2 MG/2ML IJ SOLN: 2.0000 mg | INTRAMUSCULAR | Status: DC | PRN

## 2018-08-10 MED ORDER — ROCURONIUM BROMIDE 50 MG/5ML IV SOSY
PREFILLED_SYRINGE | INTRAVENOUS | Status: AC
  Filled 2018-08-10: qty 10

## 2018-08-10 MED ORDER — SODIUM CHLORIDE 0.9 % IJ SOLN
INTRAMUSCULAR | Status: AC
  Filled 2018-08-10: qty 10

## 2018-08-10 MED ORDER — ORAL CARE MOUTH RINSE: 15.0000 mL | Freq: Two times a day (BID) | OROMUCOSAL | Status: DC

## 2018-08-10 MED ORDER — HEPARIN SODIUM (PORCINE) 1000 UNIT/ML IJ SOLN
INTRAMUSCULAR | Status: AC
  Filled 2018-08-10: qty 1

## 2018-08-10 MED ORDER — ACETAMINOPHEN 160 MG/5ML PO SOLN: 1000.0000 mg | Freq: Four times a day (QID) | ORAL | Status: DC

## 2018-08-10 MED ORDER — VASOPRESSIN 20 UNIT/ML IV SOLN
0.0100 [IU]/min | INTRAVENOUS | Status: DC
  Filled 2018-08-10: qty 2

## 2018-08-10 MED ORDER — SODIUM CHLORIDE 0.9% IV SOLUTION: Freq: Once | INTRAVENOUS | Status: DC

## 2018-08-10 MED ORDER — DOCUSATE SODIUM 100 MG PO CAPS
200.0000 mg | ORAL_CAPSULE | Freq: Every day | ORAL | Status: DC
  Filled 2018-08-10: qty 2

## 2018-08-10 MED ORDER — VANCOMYCIN HCL IN DEXTROSE 1-5 GM/200ML-% IV SOLN
1000.0000 mg | Freq: Once | INTRAVENOUS | Status: AC
  Administered 2018-08-10: 1000 mg via INTRAVENOUS
  Filled 2018-08-10: qty 200

## 2018-08-10 MED ORDER — ALBUMIN HUMAN 5 % IV SOLN
INTRAVENOUS | Status: DC | PRN
  Administered 2018-08-10 (×2): via INTRAVENOUS

## 2018-08-10 MED ORDER — VECURONIUM BROMIDE 10 MG IV SOLR
INTRAVENOUS | Status: AC
  Filled 2018-08-10: qty 10

## 2018-08-10 MED ORDER — FAMOTIDINE IN NACL 20-0.9 MG/50ML-% IV SOLN: 20.0000 mg | Freq: Two times a day (BID) | INTRAVENOUS | Status: DC

## 2018-08-10 MED ORDER — SODIUM CHLORIDE 0.9% IV SOLUTION
Freq: Once | INTRAVENOUS | Status: AC
  Administered 2018-08-10: 16:00:00 via INTRAVENOUS

## 2018-08-10 MED ORDER — HEMOSTATIC AGENTS (NO CHARGE) OPTIME
TOPICAL | Status: DC | PRN
  Administered 2018-08-10 (×4): 1 via TOPICAL

## 2018-08-10 MED ORDER — CHLORHEXIDINE GLUCONATE 0.12 % MT SOLN
15.0000 mL | OROMUCOSAL | Status: AC
  Administered 2018-08-10: 15 mL via OROMUCOSAL

## 2018-08-10 MED ORDER — ROCURONIUM BROMIDE 10 MG/ML (PF) SYRINGE
PREFILLED_SYRINGE | INTRAVENOUS | Status: DC | PRN
  Administered 2018-08-10 (×2): 50 mg via INTRAVENOUS

## 2018-08-10 MED ORDER — PROPOFOL 10 MG/ML IV BOLUS
INTRAVENOUS | Status: AC
  Filled 2018-08-10: qty 20

## 2018-08-10 MED ORDER — CHLORHEXIDINE GLUCONATE 4 % EX LIQD: 30.0000 mL | CUTANEOUS | Status: DC

## 2018-08-10 MED ORDER — MILRINONE LACTATE IN DEXTROSE 20-5 MG/100ML-% IV SOLN
0.3000 ug/kg/min | INTRAVENOUS | Status: DC
  Administered 2018-08-11: 0.3 ug/kg/min via INTRAVENOUS
  Filled 2018-08-10: qty 100

## 2018-08-10 MED ORDER — ACETAMINOPHEN 160 MG/5ML PO SOLN: 650.0000 mg | Freq: Once | ORAL | Status: AC

## 2018-08-10 MED ORDER — VECURONIUM BROMIDE 10 MG IV SOLR
INTRAVENOUS | Status: DC | PRN
  Administered 2018-08-10: 5 mg via INTRAVENOUS

## 2018-08-10 MED ORDER — PHENYLEPHRINE 40 MCG/ML (10ML) SYRINGE FOR IV PUSH (FOR BLOOD PRESSURE SUPPORT)
PREFILLED_SYRINGE | INTRAVENOUS | Status: DC | PRN
  Administered 2018-08-10: 200 ug via INTRAVENOUS
  Administered 2018-08-10: 40 ug via INTRAVENOUS
  Administered 2018-08-10: 160 ug via INTRAVENOUS
  Administered 2018-08-10: 120 ug via INTRAVENOUS
  Administered 2018-08-10: 160 ug via INTRAVENOUS
  Administered 2018-08-10: 40 ug via INTRAVENOUS
  Administered 2018-08-10 (×6): 80 ug via INTRAVENOUS

## 2018-08-10 MED ORDER — SODIUM CHLORIDE 0.9% FLUSH
3.0000 mL | Freq: Two times a day (BID) | INTRAVENOUS | Status: DC
  Administered 2018-08-12 (×2): 3 mL via INTRAVENOUS

## 2018-08-10 MED ORDER — METOPROLOL TARTRATE 12.5 MG HALF TABLET
12.5000 mg | ORAL_TABLET | Freq: Once | ORAL | Status: AC
  Administered 2018-08-10: 12.5 mg via ORAL

## 2018-08-10 MED ORDER — CHLORHEXIDINE GLUCONATE CLOTH 2 % EX PADS
6.0000 | MEDICATED_PAD | Freq: Every day | CUTANEOUS | Status: DC
  Administered 2018-08-11 – 2018-08-12 (×2): 6 via TOPICAL

## 2018-08-10 MED ORDER — PHENYLEPHRINE HCL-NACL 20-0.9 MG/250ML-% IV SOLN
0.0000 ug/min | INTRAVENOUS | Status: DC
  Administered 2018-08-11: 30 ug/min via INTRAVENOUS
  Filled 2018-08-10 (×2): qty 250

## 2018-08-10 MED ORDER — SODIUM CHLORIDE 0.9 % IV SOLN: 250.0000 mL | INTRAVENOUS | Status: DC

## 2018-08-10 MED ORDER — ACETAMINOPHEN 650 MG RE SUPP
650.0000 mg | Freq: Once | RECTAL | Status: AC
  Administered 2018-08-10: 650 mg via RECTAL

## 2018-08-10 MED ORDER — SODIUM CHLORIDE 0.45 % IV SOLN: INTRAVENOUS | Status: DC | PRN

## 2018-08-10 MED ORDER — SODIUM CHLORIDE 0.9 % IV SOLN
INTRAVENOUS | Status: DC | PRN
  Administered 2018-08-10: 750 mg via INTRAVENOUS

## 2018-08-10 MED ORDER — DEXMEDETOMIDINE HCL IN NACL 200 MCG/50ML IV SOLN
0.0000 ug/kg/h | INTRAVENOUS | Status: DC
  Filled 2018-08-10: qty 50

## 2018-08-10 MED ORDER — ASPIRIN EC 325 MG PO TBEC
325.0000 mg | DELAYED_RELEASE_TABLET | Freq: Every day | ORAL | Status: DC
  Filled 2018-08-10: qty 1

## 2018-08-10 MED ORDER — MORPHINE SULFATE (PF) 2 MG/ML IV SOLN
1.0000 mg | INTRAVENOUS | Status: DC | PRN
  Administered 2018-08-11 – 2018-08-12 (×10): 2 mg via INTRAVENOUS
  Filled 2018-08-10 (×9): qty 1

## 2018-08-10 MED ORDER — ALLOPURINOL 100 MG PO TABS
200.0000 mg | ORAL_TABLET | Freq: Every day | ORAL | Status: DC
  Administered 2018-08-11 – 2018-08-12 (×2): 200 mg via ORAL
  Filled 2018-08-10 (×2): qty 2

## 2018-08-10 MED ORDER — ACETAMINOPHEN 500 MG PO TABS
1000.0000 mg | ORAL_TABLET | Freq: Four times a day (QID) | ORAL | Status: DC
  Administered 2018-08-10 – 2018-08-13 (×10): 1000 mg via ORAL
  Filled 2018-08-10 (×9): qty 2

## 2018-08-10 MED ORDER — BISACODYL 10 MG RE SUPP: 10.0000 mg | Freq: Every day | RECTAL | Status: DC

## 2018-08-10 MED ORDER — BISACODYL 5 MG PO TBEC
10.0000 mg | DELAYED_RELEASE_TABLET | Freq: Every day | ORAL | Status: DC
  Filled 2018-08-10: qty 2

## 2018-08-10 MED ORDER — MIDAZOLAM HCL 2 MG/2ML IJ SOLN
INTRAMUSCULAR | Status: AC
  Filled 2018-08-10: qty 2

## 2018-08-10 MED ORDER — CHLORHEXIDINE GLUCONATE 0.12% ORAL RINSE (MEDLINE KIT)
15.0000 mL | Freq: Two times a day (BID) | OROMUCOSAL | Status: DC
  Administered 2018-08-10 – 2018-08-16 (×4): 15 mL via OROMUCOSAL

## 2018-08-10 MED ORDER — INSULIN REGULAR BOLUS VIA INFUSION
0.0000 [IU] | Freq: Three times a day (TID) | INTRAVENOUS | Status: DC
  Filled 2018-08-10: qty 10

## 2018-08-10 MED ORDER — POTASSIUM CHLORIDE 10 MEQ/50ML IV SOLN
10.0000 meq | INTRAVENOUS | Status: AC
  Administered 2018-08-10 (×3): 10 meq via INTRAVENOUS

## 2018-08-10 MED ORDER — DOPAMINE-DEXTROSE 3.2-5 MG/ML-% IV SOLN: 0.0000 ug/kg/min | INTRAVENOUS | Status: DC

## 2018-08-10 MED ORDER — PANTOPRAZOLE SODIUM 40 MG PO TBEC
40.0000 mg | DELAYED_RELEASE_TABLET | Freq: Every day | ORAL | Status: DC
  Administered 2018-08-12: 40 mg via ORAL
  Filled 2018-08-10: qty 1

## 2018-08-10 MED ORDER — SODIUM CHLORIDE 0.9 % IV SOLN
1.5000 g | Freq: Two times a day (BID) | INTRAVENOUS | Status: AC
  Administered 2018-08-10 – 2018-08-12 (×4): 1.5 g via INTRAVENOUS
  Filled 2018-08-10 (×4): qty 1.5

## 2018-08-10 MED ORDER — NOREPINEPHRINE 4 MG/250ML-% IV SOLN
0.0000 ug/min | INTRAVENOUS | Status: DC
  Filled 2018-08-10: qty 250

## 2018-08-10 MED ORDER — MAGNESIUM SULFATE 4 GM/100ML IV SOLN
4.0000 g | Freq: Once | INTRAVENOUS | Status: AC
  Administered 2018-08-10: 4 g via INTRAVENOUS
  Filled 2018-08-10: qty 100

## 2018-08-10 MED ORDER — FENTANYL CITRATE (PF) 250 MCG/5ML IJ SOLN
INTRAMUSCULAR | Status: AC
  Filled 2018-08-10: qty 25

## 2018-08-10 MED ORDER — SODIUM CHLORIDE 0.9 % IR SOLN
Status: DC | PRN
  Administered 2018-08-10: 5000 mL

## 2018-08-10 MED ORDER — PHENYLEPHRINE 40 MCG/ML (10ML) SYRINGE FOR IV PUSH (FOR BLOOD PRESSURE SUPPORT)
PREFILLED_SYRINGE | INTRAVENOUS | Status: AC
  Filled 2018-08-10: qty 10

## 2018-08-10 MED ORDER — SODIUM CHLORIDE 0.9 % IV SOLN
INTRAVENOUS | Status: DC | PRN
  Administered 2018-08-10: 13:00:00 via INTRAVENOUS

## 2018-08-10 MED ORDER — ALBUMIN HUMAN 5 % IV SOLN
250.0000 mL | INTRAVENOUS | Status: DC | PRN
  Administered 2018-08-10 (×2): 12.5 g via INTRAVENOUS

## 2018-08-10 MED ORDER — SODIUM CHLORIDE 0.9 % IV SOLN
INTRAVENOUS | Status: DC
  Filled 2018-08-10: qty 1

## 2018-08-10 MED ORDER — LACTATED RINGERS IV SOLN: INTRAVENOUS | Status: DC

## 2018-08-10 MED ORDER — NITROGLYCERIN IN D5W 200-5 MCG/ML-% IV SOLN: 0.0000 ug/min | INTRAVENOUS | Status: DC

## 2018-08-10 MED ORDER — SODIUM CHLORIDE 0.9% FLUSH: 3.0000 mL | INTRAVENOUS | Status: DC | PRN

## 2018-08-10 MED ORDER — METOPROLOL TARTRATE 5 MG/5ML IV SOLN: 2.5000 mg | INTRAVENOUS | Status: DC | PRN

## 2018-08-10 MED ORDER — METOPROLOL TARTRATE 12.5 MG HALF TABLET
ORAL_TABLET | ORAL | Status: AC
  Administered 2018-08-10: 12.5 mg via ORAL
  Filled 2018-08-10: qty 1

## 2018-08-10 MED ORDER — FENTANYL CITRATE (PF) 250 MCG/5ML IJ SOLN
INTRAMUSCULAR | Status: DC | PRN
  Administered 2018-08-10 (×2): 50 ug via INTRAVENOUS
  Administered 2018-08-10: 200 ug via INTRAVENOUS
  Administered 2018-08-10 (×2): 100 ug via INTRAVENOUS
  Administered 2018-08-10 (×2): 150 ug via INTRAVENOUS

## 2018-08-10 MED ORDER — CHLORHEXIDINE GLUCONATE 0.12 % MT SOLN
15.0000 mL | Freq: Once | OROMUCOSAL | Status: AC
  Administered 2018-08-10: 15 mL via OROMUCOSAL

## 2018-08-10 MED ORDER — TRAMADOL HCL 50 MG PO TABS
50.0000 mg | ORAL_TABLET | ORAL | Status: DC | PRN
  Administered 2018-08-11: 100 mg via ORAL
  Filled 2018-08-10: qty 2

## 2018-08-10 MED ORDER — ORAL CARE MOUTH RINSE: 15.0000 mL | OROMUCOSAL | Status: DC

## 2018-08-10 MED ORDER — LACTATED RINGERS IV SOLN
INTRAVENOUS | Status: DC
  Administered 2018-08-10: 23:00:00 via INTRAVENOUS

## 2018-08-10 MED ORDER — ASPIRIN 81 MG PO CHEW: 324.0000 mg | CHEWABLE_TABLET | Freq: Every day | ORAL | Status: DC

## 2018-08-10 MED ORDER — ONDANSETRON HCL 4 MG/2ML IJ SOLN
4.0000 mg | Freq: Four times a day (QID) | INTRAMUSCULAR | Status: DC | PRN
  Administered 2018-08-10 – 2018-08-13 (×3): 4 mg via INTRAVENOUS
  Filled 2018-08-10 (×2): qty 2

## 2018-08-10 MED ORDER — HEPARIN SODIUM (PORCINE) 1000 UNIT/ML IJ SOLN
INTRAMUSCULAR | Status: DC | PRN
  Administered 2018-08-10: 18000 [IU] via INTRAVENOUS

## 2018-08-10 MED ORDER — LACTATED RINGERS IV SOLN: 500.0000 mL | Freq: Once | INTRAVENOUS | Status: DC | PRN

## 2018-08-10 MED ORDER — LACTATED RINGERS IV SOLN
INTRAVENOUS | Status: DC | PRN
  Administered 2018-08-10 (×2): via INTRAVENOUS

## 2018-08-10 MED ORDER — NOREPINEPHRINE 4 MG/250ML-% IV SOLN
0.0000 ug/min | INTRAVENOUS | Status: DC
  Administered 2018-08-10: 2 ug/min via INTRAVENOUS
  Filled 2018-08-10: qty 250

## 2018-08-10 MED ORDER — SODIUM CHLORIDE 0.9% FLUSH
10.0000 mL | Freq: Two times a day (BID) | INTRAVENOUS | Status: DC
  Administered 2018-08-10 – 2018-08-12 (×3): 10 mL

## 2018-08-10 MED ORDER — PROPOFOL 10 MG/ML IV BOLUS
INTRAVENOUS | Status: DC | PRN
  Administered 2018-08-10: 100 mg via INTRAVENOUS
  Administered 2018-08-10: 30 mg via INTRAVENOUS

## 2018-08-10 SURGICAL SUPPLY — 152 items
ADAPTER CARDIO PERF ANTE/RETRO (ADAPTER) ×4 IMPLANT
ADPR PRFSN 84XANTGRD RTRGD (ADAPTER) ×3
APPLICATOR COTTON TIP 6IN STRL (MISCELLANEOUS) IMPLANT
ARTICLIP LAA PROCLIP II 45 (Clip) ×4 IMPLANT
ATTRACTOMAT 16X20 MAGNETIC DRP (DRAPES) ×4 IMPLANT
BAG DECANTER FOR FLEXI CONT (MISCELLANEOUS) ×12 IMPLANT
BANDAGE ACE 4X5 VEL STRL LF (GAUZE/BANDAGES/DRESSINGS) ×4 IMPLANT
BANDAGE ACE 6X5 VEL STRL LF (GAUZE/BANDAGES/DRESSINGS) ×4 IMPLANT
BASKET HEART (ORDER IN 25'S) (MISCELLANEOUS) ×1
BASKET HEART (ORDER IN 25S) (MISCELLANEOUS) ×3 IMPLANT
BLADE CLIPPER SURG (BLADE) IMPLANT
BLADE STERNUM SYSTEM 6 (BLADE) ×8 IMPLANT
BLADE SURG 11 STRL SS (BLADE) ×5 IMPLANT
BNDG GAUZE ELAST 4 BULKY (GAUZE/BANDAGES/DRESSINGS) ×4 IMPLANT
CANISTER SUCT 3000ML PPV (MISCELLANEOUS) ×8 IMPLANT
CANN PRFSN 3/8X14X24FR PCFC (MISCELLANEOUS)
CANN PRFSN 3/8XCNCT ST RT ANG (MISCELLANEOUS)
CANNULA EZ GLIDE AORTIC 21FR (CANNULA) ×12 IMPLANT
CANNULA FEM VENOUS REMOTE 22FR (CANNULA) ×1 IMPLANT
CANNULA FEMORAL ART 14 SM (MISCELLANEOUS) ×1 IMPLANT
CANNULA GUNDRY RCSP 15FR (MISCELLANEOUS) ×4 IMPLANT
CANNULA PRFSN 3/8X14X24FR PCFC (MISCELLANEOUS) IMPLANT
CANNULA PRFSN 3/8XCNCT RT ANG (MISCELLANEOUS) IMPLANT
CANNULA VEN MTL TIP RT (MISCELLANEOUS)
CANNULA VENNOUS METAL TIP 20FR (CANNULA) ×2 IMPLANT
CATH CPB KIT OWEN (MISCELLANEOUS) ×4 IMPLANT
CATH FOLEY 2WAY SLVR  5CC 14FR (CATHETERS)
CATH FOLEY 2WAY SLVR 5CC 14FR (CATHETERS) IMPLANT
CATH THORACIC 28FR RT ANG (CATHETERS) IMPLANT
CATH THORACIC 36FR (CATHETERS) ×4 IMPLANT
CLAMP ISOLATOR SYNERGY LG (MISCELLANEOUS) ×4 IMPLANT
CLAMP OLL ABLATION (MISCELLANEOUS) ×2 IMPLANT
CLIP FOGARTY SPRING 6M (CLIP) IMPLANT
CLIP RETRACTION 3.0MM CORONARY (MISCELLANEOUS) ×1 IMPLANT
CLIP VESOCCLUDE MED 24/CT (CLIP) IMPLANT
CLIP VESOCCLUDE SM WIDE 24/CT (CLIP) IMPLANT
CONN 1/2X1/2X1/2  BEN (MISCELLANEOUS) ×1
CONN 1/2X1/2X1/2 BEN (MISCELLANEOUS) ×3 IMPLANT
CONN 3/8X1/2 ST GISH (MISCELLANEOUS) ×9 IMPLANT
CONN ST 1/4X3/8  BEN (MISCELLANEOUS) ×2
CONN ST 1/4X3/8 BEN (MISCELLANEOUS) ×2 IMPLANT
COVER SURGICAL LIGHT HANDLE (MISCELLANEOUS) ×8 IMPLANT
CRADLE DONUT ADULT HEAD (MISCELLANEOUS) ×8 IMPLANT
DEVICE ATRICLIP LAA PRCLPII 45 (Clip) ×1 IMPLANT
DEVICE SUT CK QUICK LOAD MINI (Prosthesis & Implant Heart) ×3 IMPLANT
DRAIN CHANNEL 32F RND 10.7 FF (WOUND CARE) ×12 IMPLANT
DRAPE BILATERAL SPLIT (DRAPES) IMPLANT
DRAPE CARDIOVASCULAR INCISE (DRAPES) ×4
DRAPE CV SPLIT W-CLR ANES SCRN (DRAPES) IMPLANT
DRAPE INCISE IOBAN 66X45 STRL (DRAPES) ×8 IMPLANT
DRAPE SLUSH/WARMER DISC (DRAPES) ×8 IMPLANT
DRAPE SRG 135X102X78XABS (DRAPES) ×3 IMPLANT
DRSG AQUACEL AG ADV 3.5X14 (GAUZE/BANDAGES/DRESSINGS) ×1 IMPLANT
DRSG COVADERM 4X14 (GAUZE/BANDAGES/DRESSINGS) ×8 IMPLANT
ELECT BLADE 4.0 EZ CLEAN MEGAD (MISCELLANEOUS) ×4
ELECT REM PT RETURN 9FT ADLT (ELECTROSURGICAL) ×16
ELECTRODE BLDE 4.0 EZ CLN MEGD (MISCELLANEOUS) ×3 IMPLANT
ELECTRODE REM PT RTRN 9FT ADLT (ELECTROSURGICAL) ×12 IMPLANT
FELT TEFLON 1X6 (MISCELLANEOUS) ×16 IMPLANT
GAUZE SPONGE 4X4 12PLY STRL (GAUZE/BANDAGES/DRESSINGS) ×16 IMPLANT
GAUZE SPONGE 4X4 12PLY STRL LF (GAUZE/BANDAGES/DRESSINGS) ×2 IMPLANT
GLOVE BIO SURGEON STRL SZ 6 (GLOVE) IMPLANT
GLOVE BIO SURGEON STRL SZ 6.5 (GLOVE) IMPLANT
GLOVE BIO SURGEON STRL SZ7 (GLOVE) ×1 IMPLANT
GLOVE BIO SURGEON STRL SZ7.5 (GLOVE) IMPLANT
GLOVE ORTHO TXT STRL SZ7.5 (GLOVE) ×8 IMPLANT
GOWN STRL REUS W/ TWL LRG LVL3 (GOWN DISPOSABLE) ×24 IMPLANT
GOWN STRL REUS W/TWL LRG LVL3 (GOWN DISPOSABLE) ×32
HEMOSTAT POWDER SURGIFOAM 1G (HEMOSTASIS) ×24 IMPLANT
INSERT FOGARTY XLG (MISCELLANEOUS) ×8 IMPLANT
KIT BASIN OR (CUSTOM PROCEDURE TRAY) ×8 IMPLANT
KIT DILATOR VASC 18G NDL (KITS) ×1 IMPLANT
KIT DRAINAGE VACCUM ASSIST (KITS) ×1 IMPLANT
KIT SUCTION CATH 14FR (SUCTIONS) ×24 IMPLANT
KIT SUT CK MINI COMBO 4X17 (Prosthesis & Implant Heart) ×2 IMPLANT
KIT TURNOVER KIT B (KITS) ×8 IMPLANT
KIT VASOVIEW HEMOPRO VH 3000 (KITS) ×4 IMPLANT
LEAD PACING MYOCARDI (MISCELLANEOUS) ×4 IMPLANT
LINE VENT (MISCELLANEOUS) ×1 IMPLANT
LOOP VESSEL SUPERMAXI WHITE (MISCELLANEOUS) ×4 IMPLANT
MARKER GRAFT CORONARY BYPASS (MISCELLANEOUS) ×12 IMPLANT
NS IRRIG 1000ML POUR BTL (IV SOLUTION) ×40 IMPLANT
PACK E OPEN HEART (SUTURE) ×4 IMPLANT
PACK OPEN HEART (CUSTOM PROCEDURE TRAY) ×8 IMPLANT
PAD ARMBOARD 7.5X6 YLW CONV (MISCELLANEOUS) ×16 IMPLANT
PAD ELECT DEFIB RADIOL ZOLL (MISCELLANEOUS) ×4 IMPLANT
PENCIL BUTTON HOLSTER BLD 10FT (ELECTRODE) ×4 IMPLANT
PROBE CRYO2-ABLATION MALLABLE (MISCELLANEOUS) ×1 IMPLANT
PUNCH AORTIC ROTATE 4.0MM (MISCELLANEOUS) IMPLANT
PUNCH AORTIC ROTATE 4.5MM 8IN (MISCELLANEOUS) IMPLANT
PUNCH AORTIC ROTATE 5MM 8IN (MISCELLANEOUS) IMPLANT
RING MITRAL MEMO 4D 28 (Prosthesis & Implant Heart) ×1 IMPLANT
SET CARDIOPLEGIA MPS 5001102 (MISCELLANEOUS) ×1 IMPLANT
SET IRRIG TUBING LAPAROSCOPIC (IRRIGATION / IRRIGATOR) ×4 IMPLANT
SOLUTION ANTI FOG 6CC (MISCELLANEOUS) ×2 IMPLANT
SPONGE LAP 18X18 X RAY DECT (DISPOSABLE) IMPLANT
SPONGE LAP 4X18 RFD (DISPOSABLE) IMPLANT
SUCKER INTRACARDIAC WEIGHTED (SUCKER) ×4 IMPLANT
SUT BONE WAX W31G (SUTURE) ×8 IMPLANT
SUT ETHIBOND (SUTURE) ×2 IMPLANT
SUT ETHIBOND 2 0 SH (SUTURE) ×9 IMPLANT
SUT ETHIBOND 2 0 SH 36X2 (SUTURE) ×6 IMPLANT
SUT ETHIBOND 2 0 V4 (SUTURE) IMPLANT
SUT ETHIBOND 2 0V4 GREEN (SUTURE) IMPLANT
SUT ETHIBOND 2-0 RB-1 WHT (SUTURE) ×2 IMPLANT
SUT ETHIBOND 4 0 TF (SUTURE) IMPLANT
SUT ETHIBOND 5 0 C 1 30 (SUTURE) ×4 IMPLANT
SUT ETHIBOND X763 2 0 SH 1 (SUTURE) ×20 IMPLANT
SUT MNCRL AB 3-0 PS2 18 (SUTURE) ×16 IMPLANT
SUT MNCRL AB 4-0 PS2 18 (SUTURE) ×1 IMPLANT
SUT PDS AB 1 CTX 36 (SUTURE) ×16 IMPLANT
SUT PROLENE 2 0 SH DA (SUTURE) IMPLANT
SUT PROLENE 3 0 SH 1 (SUTURE) ×8 IMPLANT
SUT PROLENE 3 0 SH DA (SUTURE) ×6 IMPLANT
SUT PROLENE 3 0 SH1 36 (SUTURE) ×5 IMPLANT
SUT PROLENE 4 0 RB 1 (SUTURE) ×48
SUT PROLENE 4 0 SH DA (SUTURE) ×16 IMPLANT
SUT PROLENE 4-0 RB1 .5 CRCL 36 (SUTURE) ×14 IMPLANT
SUT PROLENE 4-0 RB1 18X2 ARM (SUTURE) IMPLANT
SUT PROLENE 5 0 C 1 36 (SUTURE) IMPLANT
SUT PROLENE 6 0 C 1 30 (SUTURE) IMPLANT
SUT PROLENE 7.0 RB 3 (SUTURE) ×12 IMPLANT
SUT PROLENE 8 0 BV175 6 (SUTURE) IMPLANT
SUT PROLENE BLUE 7 0 (SUTURE) ×4 IMPLANT
SUT PROLENE POLY MONO (SUTURE) IMPLANT
SUT PTFE CHORD X 20MM (SUTURE) ×2 IMPLANT
SUT SILK  1 MH (SUTURE) ×3
SUT SILK 1 MH (SUTURE) ×6 IMPLANT
SUT STEEL 6MS V (SUTURE) IMPLANT
SUT STEEL STERNAL CCS#1 18IN (SUTURE) IMPLANT
SUT STEEL SZ 6 DBL 3X14 BALL (SUTURE) IMPLANT
SUT VIC AB 1 CTX 36 (SUTURE)
SUT VIC AB 1 CTX36XBRD ANBCTR (SUTURE) IMPLANT
SUT VIC AB 2-0 CT1 27 (SUTURE) ×4
SUT VIC AB 2-0 CT1 TAPERPNT 27 (SUTURE) IMPLANT
SUT VIC AB 2-0 CTX 27 (SUTURE) IMPLANT
SUT VIC AB 3-0 SH 27 (SUTURE)
SUT VIC AB 3-0 SH 27X BRD (SUTURE) IMPLANT
SUT VIC AB 3-0 X1 27 (SUTURE) IMPLANT
SUT VICRYL 4-0 PS2 18IN ABS (SUTURE) IMPLANT
SYSTEM SAHARA CHEST DRAIN ATS (WOUND CARE) ×8 IMPLANT
TAPE CLOTH SURG 4X10 WHT LF (GAUZE/BANDAGES/DRESSINGS) ×2 IMPLANT
TAPE PAPER 2X10 WHT MICROPORE (GAUZE/BANDAGES/DRESSINGS) ×1 IMPLANT
TOWEL GREEN STERILE (TOWEL DISPOSABLE) ×8 IMPLANT
TOWEL GREEN STERILE FF (TOWEL DISPOSABLE) ×8 IMPLANT
TRAY FOLEY SLVR 14FR TEMP STAT (SET/KITS/TRAYS/PACK) ×3 IMPLANT
TRAY FOLEY SLVR 16FR TEMP STAT (SET/KITS/TRAYS/PACK) ×7 IMPLANT
TUBING INSUFFLATION (TUBING) ×4 IMPLANT
TUBING INSUFFLATION 10FT LAP (TUBING) ×4 IMPLANT
TUBING MEDICAL 3X8X3X32 (MISCELLANEOUS) ×1 IMPLANT
UNDERPAD 30X30 (UNDERPADS AND DIAPERS) ×8 IMPLANT
WATER STERILE IRR 1000ML POUR (IV SOLUTION) ×16 IMPLANT

## 2018-08-10 NOTE — Op Note (Signed)
CARDIOTHORACIC SURGERY OPERATIVE NOTE  Date of Procedure:  08/10/2018  Preoperative Diagnosis:   Severe Mitral Regurgitation  Severe Multivessel Coronary Artery Disease  Longstanding Persistent Atrial Fibrillation  Postoperative Diagnosis: Same  Procedure:   Mitral Valve Repair  Complex valvuloplasty including Gore-tex neochord placement x8  Decalcification of mitral annulus  Plication of posterior commissure  Sorin Memo 4D ring annuloplasty (size 40m, model #4DM-28, serial ##[id number]   Coronary Artery Bypass Grafting x 1   Left Internal Mammary Artery to Distal Left Anterior Descending Coronary Artery  Endoscopic Vein Harvest from Right Thigh    Maze Procedure   complete bilateral atrial lesion set using bipolar radiofrequency and cryothermy ablation  clipping of left atrial appendage (Atricure Pro245 left atrial clip, size 4109m    Surgeon: ClValentina GuOwRoxy MannsMD  Assistant: ErEllwood HandlerPA-C  Anesthesia: ThRenold DonMD  Operative Findings:  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  Dystrophic calcification of mitral annulus  Flail segment of anterior leaflet (A3) and posterior commissure  Type II dysfunction with severe mitral regurgitation  Normal left ventricular systolic function  Good quality left internal mammary conduit for grafting  Small caliber but good quality left anterior descending coronary artery target for grafting  Terminal branch of left circumflex too small for grafting  Greater saphenous vein harvested from right thigh but not utilized   No residual mitral regurgitation after successful valve repair                    BRIEF CLINICAL NOTE AND INDICATIONS FOR SURGERY  Patient is a [age]ear old female with history of mitral regurgitation, long-standing persistent atrial fibrillation on oral anticoagulation, hypertension, chronic diastolic congestive  heart failure, previous spine surgery for management ofcompression fracture of the lumbar spine, and remote history of breast cancer who has been referred for surgical consultation to discuss treatment options for management of severe symptomatic primary mitral regurgitation, coronary artery disease, and persistent atrial fibrillation.  Patient states that she has known of presence of a heart murmur for at least 4 years. She describes chronic symptoms of exertional shortness of breath and fatigue for a similar amount of time. In 2016 she was diagnosed with persistent atrial fibrillation and she has been cared for by Dr. SmTamala Julianver since. She underwent attempted DC cardioversion but failed to maintain sinus rhythm. She has been treated with rate control and long-term anticoagulation using warfarin. Previous echocardiograms have documented the presence of normal left ventricular systolic function with what was felt to be mild mitral regurgitation. She underwent spine surgery for lumbar compression fracturewith complications, ultimately needing redo spine surgery to remove the previously placed screws. Her physical recovery following this was slow. During the same time her husband was ill with cancer and ultimately passed away. Since that time she has had progressing symptoms of exertional shortness of breath without chest pain or chest tightness. Symptoms progressed to the point where she developed shortness of breath with very mild activity, orthopnea, and lower extremity edema a few months ago. She was started on oral Lasix and symptoms have improved, but she continues to get short of breath and fatigued with moderate level activity. This limits her daily activities considerably. She was seen in follow-up recently and transthoracic echocardiogram revealed normal left ventricular systolic function with what was felt to be at least moderate if not severe mitral regurgitation. She underwent TEE and  diagnostic cardiac catheterization on June 20, 2018. TEE revealed normal left ventricular  systolic function with severe mitral regurgitation and severe left atrial enlargement. Catheterization revealed severe multivessel coronary artery disease with mild pulmonary hypertension. Cardiothoracic surgical consultation was requested.  The patient has been seen in consultation and counseled at length regarding the indications, risks and potential benefits of surgery.  All questions have been answered, and the patient provides full informed consent for the operation as described.    DETAILS OF THE OPERATIVE PROCEDURE  Preparation:  The patient is brought to the operating room on the above mentioned date and central monitoring was established by the anesthesia team including placement of Swan-Ganz catheter and radial arterial line.  There was moderate to severe pulmonary hypertension at baseline.  The patient is placed in the supine position on the operating table.  Intravenous antibiotics are administered. General endotracheal anesthesia is induced uneventfully. A Foley catheter is placed.  Baseline transesophageal echocardiogram was performed.  Findings were notable for severe mitral regurgitation.  The mitral valve appeared to have myxomatous degeneration with an obvious ruptured primary chordae tendinae arising from the A3 segment of the anterior leaflet.  There was severe prolapse involving the A3 segment of the anterior leaflet as well as the posterior lateral commissure itself.  There was severe dystrophic calcification just above the posterior lateral commissure on the left atrial side of the annulus.  There was severe left atrial enlargement.  There was normal left and right ventricular size and systolic function.  There was mild central tricuspid regurgitation.  The tricuspid annulus was not dilated.  The patient's chest, abdomen, both groins, and both lower extremities are prepared and draped in  a sterile manner. A time out procedure is performed.   Surgical Approach and Conduit Harvest:  A median sternotomy incision was performed and the left internal mammary artery is dissected from the chest wall and prepared for bypass grafting. The left internal mammary artery is notably good quality conduit. Simultaneously, the greater saphenous vein is obtained from the patient's right thigh using endoscopic vein harvest technique. The saphenous vein is notably small caliber but otherwise good quality conduit. After removal of the saphenous vein, the small surgical incisions in the lower extremity are closed with absorbable suture. Following systemic heparinization, the left internal mammary artery was transected distally noted to have excellent flow.   Extracorporeal Cardiopulmonary Bypass and Myocardial Protection:  The right common femoral vein is cannulated using the Seldinger technique and a guidewire advanced into the right atrium using TEE guidance.  The patient is heparinized systemically and the femoral vein cannulated using a 22 Fr long femoral venous cannula.  The ascending aorta is cannulated for cardiopulmonary bypass.  Adequate heparinization is verified.   A retrograde cardioplegia cannula is placed through the right atrium into the coronary sinus.  The entire pre-bypass portion of the operation was notable for stable hemodynamics.  Cardiopulmonary bypass was begun and the surface of the heart is inspected.  A second venous cannula is placed directly into the superior vena cava.  The surface of the heart is inspected and distal sites are selective for coronary bypass grafting.  The terminal branch of the left circumflex coronary artery was inspected carefully and notably too small for grafting.  A cardioplegia cannula is placed in the ascending aorta.  A temperature probe was placed in the interventricular septum.  The patient is cooled to 28C systemic temperature.  The aortic cross  clamp is applied and cardioplegia is delivered initially in an antegrade fashion through the aortic root using modified [place]  cold blood cardioplegia (Kennestone blood cardioplegia protocol).   The initial cardioplegic arrest is rapid with early diastolic arrest.  Repeat doses of cardioplegia are administered at 90 minutes and every 30 minutes thereafter through the coronary sinus catheter in order to maintain completely flat electrocardiogram.  Myocardial protection was felt to be excellent.   Coronary Artery Bypass Grafting:  The distal left anterior coronary artery was grafted with the left internal mammary artery in an end-to-side fashion.  At the site of distal anastomosis the target vessel was good quality and measured approximately 1.4 mm in diameter.   Maze Procedure (left atrial lesion set):  The AtriCure Synergy bipolar radiofrequency ablation clamp is used for all radiofrequency ablation lesions for the maze procedure.  The Atricure CryoICE nitrous oxide cryothermy system is utilized for all cryothermy ablation lesions.   The heart is retracted towards the surgeon's side and the left sided pulmonary veins exposed.  An elliptical ablation lesion is created around the base of the left sided pulmonary veins.  A similar elliptical lesion was created around the base of the left atrial appendage.  The left atrial appendage was obliterated using an Atricure left atrial appendage clip (Atricure 245, size 45 mm).  The heart was replaced into the pericardial sac.  An elliptical ablation lesion is created around the base of the left sided pulmonary veins.   A left atriotomy incision was performed through the interatrial groove and extended partially across the back wall of the left atrium after opening the oblique sinus inferiorly.  The floor of the left atrium and the mitral valve were exposed using a self-retaining retractor.  A bipolar ablation lesion was placed across the dome of the left  atrium from the cephalad apex of the atriotomy incision to reach the cephalad apex of the elliptical lesion around the left sided pulmonary veins.  A similar bipolar lesion was placed across the back wall of the left atrium from the caudad apex of the atriotomy incision to reach the caudad apex of the elliptical lesion around the left sided pulmonary veins, thereby completing a box.  Finally another bipolar lesion was placed across the back wall of the left atrium from the caudad apex of the atriotomy incision towards the posterior mitral valve annulus.  This lesion was completed along the endocardial surface onto the posterior mitral annulus with a 3 minute duration cryothermy lesion, followed by a second cryothermy lesion along the posterior epicardial surface of the left atrium across the coronary sinus.   This completes the entire left side lesion set of the Cox maze procedure.   Mitral Valve Repair:  The mitral valve was inspected and notable for fibroelastic deficiency type myxomatous degenerative disease with an obvious flail segment involving the A3 portion of the anterior leaflet and the posterior lateral commissure.  There were multiple ruptured primary chordae tendinae.  There was an area of dystrophic calcification involving the mitral annulus at the posterior lateral commissure.  This area of calcification extended into the left atrium and into the commissural leaflet.    The dystrophic calcification near the posterior lateral commissure was debrided.  The commissural leaflet was then repaired and plicated using several interrupted everting 4-0 Prolene suture.  Artificial neochord placement was performed using Chord-X multi-strand CV-4 Goretex pre-measured loops.  The appropriate cord length was measured from corresponding normal length primary cords from the A2 segment of the anterior leaflet. The papillary muscle suture of the Chord-X multi-strand suture was placed through the head of the  posterior papillary muscle in a horizontal mattress fashion and tied over Teflon felt pledgets. Two of the three pre-measured loops were then reimplanted into the free margin of the A3 segment of the anterior leaflet.  The papillary muscle suture of a second Chord-X multi-strand suture was placed through the head of the posterior papillary muscle in a horizontal mattress fashion and tied over Teflon felt pledgets. Two of the three pre-measured loops were then reimplanted into the free margin of the commissural leaflet.    Interrupted 2-0 Ethibond horizontal mattress sutures are placed circumferentially around the entire mitral valve annulus. The valve was tested with saline and appeared competent even without ring annuloplasty complete. The valve was sized to a 28 mm annuloplasty ring, based upon the transverse distance between the left and right commissures and the height of the anterior leaflet, corresponding to a size just slightly larger than the overall surface area of the anterior leaflet.  A Sorin Memo 4D annuloplasty ring (size 81m, catalog #4DM-28, serial #[id number] was secured in place uneventfully. All ring sutures were secured using a Cor-knot device.    The valve was tested with saline and appeared competent. There is no residual leak. There was a broad, symmetrical line of coaptation of the anterior and posterior leaflet which was confirmed using the blue ink test.  Rewarming is begun.  The atriotomy was closed using a 2-layer closure of running 3-0 Prolene suture after placing a sump drain across the mitral valve to serve as a left ventricular vent.  One final dose of warm retrograde "reanimation dose" cardioplegia was administered retrograde through the coronary sinus catheter while all air was evacuated through the aortic root.  The septal myocardial temperature rose rapidly after reperfusion of the left internal mammary artery graft.  The aortic cross clamp was removed after a total cross  clamp time of 132 minutes.   Maze Procedure (right atrial lesion set):  The retrograde cardioplegia cannula was removed and the small hole in the right atrium extended a short distance.  The AtriCure Synergy bipolar radiofrequency ablation clamp is utilized to create a series of linear lesions in the right atrium, each with one limb of the clamp along the endocardial surface and the other along the epicardial surface. The first lesion is placed from the posterior apex of the atriotomy incision and along the lateral wall of the right atrium to reach the lateral aspect of the superior vena cava. A second lesion is placed in the opposite direction from the posterior apex of the atriotomy incision along the lateral wall to reach the lateral aspect of the inferior vena cava. A third lesion is placed from the midportion of the atriotomy incision extending at a right angle to reach the tip of the right atrial appendage. A fourth lesion is placed from the anterior apex of the atriotomy incision in an anterior and inferior direction to reach the acute margin of the heart. Finally, the cryotherapy probe is utilized to complete the right atrial lesion set by placing the probe along the endocardial surface of the right atrium from the anterior apex of the atriotomy incision to reach the tricuspid annulus at the 2:00 position. The right atriotomy incision is closed with a 2 layer closure of running 4-0 Prolene suture.   Procedure Completion:  Epicardial pacing wires are fixed to the right ventricular outflow tract and to the right atrial appendage. The patient is rewarmed to 37C temperature. The aortic and left ventricular vents are removed.  The patient  is weaned and disconnected from cardiopulmonary bypass.  The patient's rhythm at separation from bypass was AV paced.  The patient was weaned from cardiopulmonary bypass on low dose milrinone. Total cardiopulmonary bypass time for the operation was 181  minutes.  Followup transesophageal echocardiogram performed after separation from bypass revealed a well-seated annuloplasty ring in the mitral position.  The mitral valve is functioning normally.  There was no residual leak.  Mean transvalvular gradient across the mitral valve was estimated 2 mmHg.  Left ventricular function was unchanged from preoperatively.  The aortic and superior vena cava cannula were removed uneventfully. Protamine was administered to reverse the anticoagulation. The femoral venous cannula was removed and manual pressure held on the groin for 30 minutes.  The mediastinum and pleural space were inspected for hemostasis and irrigated with saline solution.  There is moderate coagulopathy.  The patient received a total of 2 packs adult platelets and 2 units fresh frozen plasma due to coagulopathy and thrombocytopenia after separation from cardiopulmonary bypass and reversal of heparin with protamine.  The mediastinum and both pleural spaces were drained using 4 chest tubes placed through separate stab incisions inferiorly.  The soft tissues anterior to the aorta were reapproximated loosely. The sternum is closed with double strength sternal wire. The soft tissues anterior to the sternum were closed in multiple layers and the skin is closed with a running subcuticular skin closure.  The post-bypass portion of the operation was notable for stable rhythm and hemodynamics.  No blood products were administered during the operation.   Patient Disposition:  The patient tolerated the procedure well and is transported to the surgical intensive care in stable condition. There are no intraoperative complications. All sponge instrument and needle counts are verified correct at completion of the operation.     Valentina Gu. Roxy Manns MD 08/10/2018 2:05 PM

## 2018-08-10 NOTE — Progress Notes (Signed)
Patient ID: Michele Green, female   DOB: Jan 06, 1941, 77 y.o.   MRN: [id number]  TCTS Evening Rounds:   Hemodynamically stable  CI = 1.9  Has started to wake up on vent.   Urine output good  CT output low Receiving platelets for thrombocytopenia and FFP for INR 1.7  CBC    Component Value Date/Time   WBC 6.1 08/10/2018 1440   RBC 3.27 (L) 08/10/2018 1440   HGB 11.2 (L) 08/10/2018 1440   HGB 12.5 07/04/2018 1024   HGB 12.7 09/30/2017 0945   HCT 32.3 (L) 08/10/2018 1440   HCT 36.1 07/04/2018 1024   HCT 37.2 09/30/2017 0945   PLT 62 (L) 08/10/2018 1440   PLT 132 (L) 07/04/2018 1024   MCV 98.8 08/10/2018 1440   MCV 105 (H) 07/04/2018 1024   MCV 107.0 (H) 09/30/2017 0945   MCH 34.3 (H) 08/10/2018 1440   MCHC 34.7 08/10/2018 1440   RDW 19.4 (H) 08/10/2018 1440   RDW 12.8 07/04/2018 1024   RDW 13.6 09/30/2017 0945   LYMPHSABS 0.8 (L) 07/19/2018 1320   LYMPHSABS 0.6 (L) 09/30/2017 0945   MONOABS 0.3 07/19/2018 1320   MONOABS 0.4 09/30/2017 0945   EOSABS 0.1 07/19/2018 1320   EOSABS 0.1 09/30/2017 0945   BASOSABS 0.0 07/19/2018 1320   BASOSABS 0.0 09/30/2017 0945     BMET    Component Value Date/Time   NA 146 (H) 08/10/2018 1422   NA 141 07/04/2018 1024   NA 143 09/30/2017 0945   K 4.0 08/10/2018 1422   K 4.3 09/30/2017 0945   CL 109 08/10/2018 1300   CO2 18 (L) 08/07/2018 1109   CO2 25 09/30/2017 0945   GLUCOSE 137 (H) 08/10/2018 1300   GLUCOSE 86 09/30/2017 0945   BUN 10 08/10/2018 1300   BUN 13 07/04/2018 1024   BUN 14.5 09/30/2017 0945   CREATININE 0.40 (L) 08/10/2018 1300   CREATININE 0.8 09/30/2017 0945   CALCIUM 8.1 (L) 08/07/2018 1109   CALCIUM 9.8 09/30/2017 0945   GFRNONAA >60 08/07/2018 1109   GFRAA >60 08/07/2018 1109     A/P:  Stable postop course. Continue current plans

## 2018-08-10 NOTE — Interval H&P Note (Signed)
History and Physical Interval Note:  08/10/2018 7:01 AM  Michele Green  has presented today for surgery, with the diagnosis of MR CAD AFIB  The various methods of treatment have been discussed with the patient and family. After consideration of risks, benefits and other options for treatment, the patient has consented to  Procedure(s) with comments: MITRAL VALVE REPAIR (MVR) OR REPLACEMENT (N/A) - glutaraldehyde CORONARY ARTERY BYPASS GRAFTING (CABG) (N/A) MAZE (N/A) TRANSESOPHAGEAL ECHOCARDIOGRAM (TEE) (N/A) as a surgical intervention .  The patient's history has been reviewed, patient examined, no change in status, stable for surgery.  I have reviewed the patient's chart and labs.  Questions were answered to the patient's satisfaction.     Rexene Alberts

## 2018-08-10 NOTE — Anesthesia Procedure Notes (Signed)
Procedure Name: Intubation Date/Time: 08/10/2018 8:05 AM Performed by: Colin Benton, CRNA Pre-anesthesia Checklist: Patient identified, Emergency Drugs available, Suction available and Patient being monitored Patient Re-evaluated:Patient Re-evaluated prior to induction Oxygen Delivery Method: Circle system utilized Preoxygenation: Pre-oxygenation with 100% oxygen Induction Type: IV induction Ventilation: Mask ventilation without difficulty Laryngoscope Size: Miller and 2 Grade View: Grade II Tube type: Oral Tube size: 7.0 mm Number of attempts: 1 Airway Equipment and Method: Stylet Placement Confirmation: ETT inserted through vocal cords under direct vision,  positive ETCO2 and breath sounds checked- equal and bilateral Secured at: 22 cm Tube secured with: Tape Dental Injury: Teeth and Oropharynx as per pre-operative assessment

## 2018-08-10 NOTE — Anesthesia Postprocedure Evaluation (Signed)
Anesthesia Post Note  Patient: Michele Green  Procedure(s) Performed: MITRAL VALVE REPAIR (MVR) (N/A Chest) CORONARY ARTERY BYPASS GRAFTING (CABG) x 1, LIMA-LAD,  USING LEFT INTERNAL MAMMARY ARTERY. HARVESTED RIGHT GREATER SAPHENOUS VEIN ENDOSCOPICALLY (N/A Chest) MAZE (N/A ) TRANSESOPHAGEAL ECHOCARDIOGRAM (TEE) (N/A ) CLIPPING OF ATRIAL APPENDAGE (Chest)     Patient location during evaluation: ICU Anesthesia Type: General Level of consciousness: sedated and patient remains intubated per anesthesia plan Pain management: pain level controlled Vital Signs Assessment: post-procedure vital signs reviewed and stable Respiratory status: patient remains intubated per anesthesia plan Cardiovascular status: stable (Vasopressors weaning) Postop Assessment: no apparent nausea or vomiting Anesthetic complications: no    Last Vitals:  Vitals:   08/10/18 1700 08/10/18 1715  BP: (!) 109/59   Pulse: 86 63  Resp: (!) 22 (!) 22  Temp: 37 C 37.1 C  SpO2: 99% 99%    Last Pain:  Vitals:   08/10/18 1500  TempSrc: Core (Comment)                 Audry Pili

## 2018-08-10 NOTE — Anesthesia Procedure Notes (Signed)
Central Venous Catheter Insertion Performed by: Lillia Abed, MD, anesthesiologist Start/End9/19/2019 6:45 AM, 08/10/2018 7:00 AM Patient location: OR. Preanesthetic checklist: patient identified, IV checked, risks and benefits discussed, surgical consent, monitors and equipment checked, pre-op evaluation, timeout performed and anesthesia consent Position: Trendelenburg Lidocaine 1% used for infiltration and patient sedated Hand hygiene performed  and maximum sterile barriers used  Catheter size: 8.5 Fr Central line and PA cath was placed.MAC introducer Procedure performed using ultrasound guided technique. Ultrasound Notes:anatomy identified, needle tip was noted to be adjacent to the nerve/plexus identified, no ultrasound evidence of intravascular and/or intraneural injection and image(s) printed for medical record Attempts: 1 Following insertion, line sutured, dressing applied and Biopatch. Post procedure assessment: blood return through all ports, free fluid flow and no air  Patient tolerated the procedure well with no immediate complications.

## 2018-08-10 NOTE — Brief Op Note (Signed)
08/10/2018  1:56 PM  PATIENT:  Michele Green  77 y.o. 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  PROCEDURE:  Procedure(s) with comments:  MITRAL VALVE REPAIR (MVR)  -Complex Valvular Annuloplasty -28 mm Sorin Memo 4D Ring -Placement of 8 Gore-tex Neochords -Debridement of Annular Calcification -Plication of Anterior Leaflet P3  CORONARY ARTERY BYPASS GRAFTING x 1 -Left Internal Mammary Artery to Left Anterior Descending Artery  ENDOSCOPIC HARVEST GREATER SAPHENOUS VEIN -Right Thigh  MAZE  -Complete Bi-Atrial Lesion Set using Radiofrequency Ablation and Cryothermy -Clipping of Left Atrial Appendage with 45 mm Atricure Pro2Clip  TRANSESOPHAGEAL ECHOCARDIOGRAM (TEE) (N/A)  SURGEON:  Surgeon(s) and Role:    Rexene Alberts, MD - Primary  PHYSICIAN ASSISTANT: Erin Barrett PA-C  ANESTHESIA:   general  EBL:  725 mL  BLOOD ADMINISTERED:2U PRBC and CELLSAVER  DRAINS: Mediastinal Chest Drains  + Bilateral Pleural Drains  LOCAL MEDICATIONS USED:  NONE  SPECIMEN:  No Specimen  DISPOSITION OF SPECIMEN:  N/A  COUNTS:  YES  DICTATION: .Dragon Dictation  PLAN OF CARE: Admit to inpatient   PATIENT DISPOSITION:  ICU - intubated and hemodynamically stable.   Delay start of Pharmacological VTE agent (>24hrs) due to surgical blood loss or risk of bleeding: yes

## 2018-08-10 NOTE — Progress Notes (Signed)
RT Note: Post heart weaning process started. Patient following commands. Pt placed on a rr of 4 and 40%. RT tp  continue to monitor.

## 2018-08-10 NOTE — Anesthesia Procedure Notes (Signed)
Arterial Line Insertion Start/End9/19/2019 6:45 AM, 08/10/2018 6:55 AM Performed by: Colin Benton, CRNA, CRNA  Patient location: Pre-op. Preanesthetic checklist: patient identified, IV checked, site marked, risks and benefits discussed, surgical consent, monitors and equipment checked, pre-op evaluation, timeout performed and anesthesia consent Lidocaine 1% used for infiltration and patient sedated Right, radial was placed Catheter size: 20 G Hand hygiene performed , maximum sterile barriers used  and Seldinger technique used Allen's test indicative of satisfactory collateral circulation Attempts: 1 Procedure performed without using ultrasound guided technique. Following insertion, dressing applied and Biopatch. Post procedure assessment: normal and unchanged  Patient tolerated the procedure well with no immediate complications.

## 2018-08-10 NOTE — Transfer of Care (Signed)
Immediate Anesthesia Transfer of Care Note  Patient: Michele Green  Procedure(s) Performed: MITRAL VALVE REPAIR (MVR) (N/A Chest) CORONARY ARTERY BYPASS GRAFTING (CABG) x 1, LIMA-LAD,  USING LEFT INTERNAL MAMMARY ARTERY. HARVESTED RIGHT GREATER SAPHENOUS VEIN ENDOSCOPICALLY (N/A Chest) MAZE (N/A ) TRANSESOPHAGEAL ECHOCARDIOGRAM (TEE) (N/A ) CLIPPING OF ATRIAL APPENDAGE (Chest)  Patient Location: SICU  Anesthesia Type:General  Level of Consciousness: Patient remains intubated per anesthesia plan  Airway & Oxygen Therapy: Patient remains intubated per anesthesia plan and Patient placed on Ventilator (see vital sign flow sheet for setting)  Post-op Assessment: Report given to RN and Post -op Vital signs reviewed and stable  Post vital signs: Reviewed and stable  Last Vitals:  Vitals Value Taken Time  BP 87/72 08/10/2018  2:37 PM  Temp 35.4 C 08/10/2018  2:42 PM  Pulse 90 08/10/2018  2:42 PM  Resp 12 08/10/2018  2:42 PM  SpO2 96 % 08/10/2018  2:42 PM  Vitals shown include unvalidated device data.  Last Pain:  Vitals:   08/10/18 0546  TempSrc: Oral         Complications: No apparent anesthesia complications

## 2018-08-10 NOTE — Progress Notes (Signed)
  Echocardiogram Echocardiogram Transesophageal has been performed.  Michele Green 08/10/2018, 8:47 AM

## 2018-08-11 ENCOUNTER — Encounter (HOSPITAL_COMMUNITY): Payer: Self-pay | Admitting: Thoracic Surgery (Cardiothoracic Vascular Surgery)

## 2018-08-11 ENCOUNTER — Inpatient Hospital Stay (HOSPITAL_COMMUNITY): Payer: Medicare Other

## 2018-08-11 ENCOUNTER — Inpatient Hospital Stay: Payer: Self-pay

## 2018-08-11 LAB — BPAM FFP
BLOOD PRODUCT EXPIRATION DATE: 201909202359
BLOOD PRODUCT EXPIRATION DATE: 201909202359
Blood Product Expiration Date: 201909212359
Blood Product Expiration Date: 201909222359
ISSUE DATE / TIME: 201909190839
ISSUE DATE / TIME: 201909190839
ISSUE DATE / TIME: 201909191736
ISSUE DATE / TIME: 201909191736
UNIT TYPE AND RH: 6200
UNIT TYPE AND RH: 6200
UNIT TYPE AND RH: 6200
Unit Type and Rh: 600

## 2018-08-11 LAB — CBC
HEMATOCRIT: 28.3 % — AB (ref 36.0–46.0)
HEMATOCRIT: 31.7 % — AB (ref 36.0–46.0)
Hemoglobin: 9.6 g/dL — ABNORMAL LOW (ref 12.0–15.0)
Hemoglobin: 10.1 g/dL — ABNORMAL LOW (ref 12.0–15.0)
MCH: 34 pg (ref 26.0–34.0)
MCH: 33.1 pg (ref 26.0–34.0)
MCHC: 33.9 g/dL (ref 30.0–36.0)
MCHC: 31.9 g/dL (ref 30.0–36.0)
MCV: 100.4 fL — ABNORMAL HIGH (ref 78.0–100.0)
MCV: 103.9 fL — AB (ref 78.0–100.0)
PLATELETS: 117 10*3/uL — AB (ref 150–400)
Platelets: 109 10*3/uL — ABNORMAL LOW (ref 150–400)
RBC: 2.82 MIL/uL — AB (ref 3.87–5.11)
RBC: 3.05 MIL/uL — ABNORMAL LOW (ref 3.87–5.11)
RDW: 20.6 % — ABNORMAL HIGH (ref 11.5–15.5)
RDW: 20.9 % — ABNORMAL HIGH (ref 11.5–15.5)
WBC: 9 10*3/uL (ref 4.0–10.5)
WBC: 11.8 10*3/uL — AB (ref 4.0–10.5)

## 2018-08-11 LAB — GLUCOSE, CAPILLARY
GLUCOSE-CAPILLARY: 138 mg/dL — AB (ref 70–99)
GLUCOSE-CAPILLARY: 117 mg/dL — AB (ref 70–99)
GLUCOSE-CAPILLARY: 111 mg/dL — AB (ref 70–99)
GLUCOSE-CAPILLARY: 90 mg/dL (ref 70–99)
GLUCOSE-CAPILLARY: 105 mg/dL — AB (ref 70–99)
GLUCOSE-CAPILLARY: 107 mg/dL — AB (ref 70–99)
GLUCOSE-CAPILLARY: 121 mg/dL — AB (ref 70–99)
GLUCOSE-CAPILLARY: 150 mg/dL — AB (ref 70–99)
Glucose-Capillary: 106 mg/dL — ABNORMAL HIGH (ref 70–99)
Glucose-Capillary: 127 mg/dL — ABNORMAL HIGH (ref 70–99)

## 2018-08-11 LAB — PREPARE PLATELET PHERESIS
UNIT DIVISION: 0
UNIT DIVISION: 0
Unit division: 0
Unit division: 0
Unit division: 0

## 2018-08-11 LAB — POCT I-STAT, CHEM 8
BUN: 12 mg/dL (ref 8–23)
Calcium, Ion: 1 mmol/L — ABNORMAL LOW (ref 1.15–1.40)
Chloride: 104 mmol/L (ref 98–111)
Creatinine, Ser: 0.9 mg/dL (ref 0.44–1.00)
Glucose, Bld: 113 mg/dL — ABNORMAL HIGH (ref 70–99)
HEMATOCRIT: 29 % — AB (ref 36.0–46.0)
HEMOGLOBIN: 9.9 g/dL — AB (ref 12.0–15.0)
Potassium: 4 mmol/L (ref 3.5–5.1)
SODIUM: 139 mmol/L (ref 135–145)
TCO2: 24 mmol/L (ref 22–32)

## 2018-08-11 LAB — BPAM PLATELET PHERESIS
BLOOD PRODUCT EXPIRATION DATE: 201909212359
Blood Product Expiration Date: 201909192359
Blood Product Expiration Date: 201909192359
Blood Product Expiration Date: 201909202359
Blood Product Expiration Date: 201909202359
ISSUE DATE / TIME: 201909191218
ISSUE DATE / TIME: 201909191218
ISSUE DATE / TIME: 201909191602
ISSUE DATE / TIME: 201909191611
ISSUE DATE / TIME: 201909191734
UNIT TYPE AND RH: 6200
Unit Type and Rh: 2800
Unit Type and Rh: 5100
Unit Type and Rh: 5100
Unit Type and Rh: 6200

## 2018-08-11 LAB — BASIC METABOLIC PANEL
Anion gap: 8 (ref 5–15)
BUN: 9 mg/dL (ref 8–23)
CO2: 23 mmol/L (ref 22–32)
Calcium: 7 mg/dL — ABNORMAL LOW (ref 8.9–10.3)
Chloride: 110 mmol/L (ref 98–111)
Creatinine, Ser: 0.64 mg/dL (ref 0.44–1.00)
GFR calc Af Amer: >60 mL/min (ref >60)
GFR calc non Af Amer: >60 mL/min (ref >60)
GLUCOSE: 113 mg/dL — AB (ref 70–99)
POTASSIUM: 3.8 mmol/L (ref 3.5–5.1)
Sodium: 141 mmol/L (ref 135–145)

## 2018-08-11 LAB — PREPARE FRESH FROZEN PLASMA
UNIT DIVISION: 0
Unit division: 0
Unit division: 0

## 2018-08-11 LAB — MAGNESIUM
Magnesium: 2.8 mg/dL — ABNORMAL HIGH (ref 1.7–2.4)
Magnesium: 2.6 mg/dL — ABNORMAL HIGH (ref 1.7–2.4)

## 2018-08-11 LAB — CREATININE, SERUM
Creatinine, Ser: 1.02 mg/dL — ABNORMAL HIGH (ref 0.44–1.00)
GFR calc non Af Amer: 51 mL/min — ABNORMAL LOW (ref >60)
GFR, EST AFRICAN AMERICAN: 59 mL/min — AB (ref >60)

## 2018-08-11 MED ORDER — METOPROLOL TARTRATE 12.5 MG HALF TABLET
12.5000 mg | ORAL_TABLET | Freq: Two times a day (BID) | ORAL | Status: DC
  Administered 2018-08-11 – 2018-08-13 (×6): 12.5 mg via ORAL
  Filled 2018-08-11 (×6): qty 1

## 2018-08-11 MED ORDER — ASPIRIN EC 81 MG PO TBEC
81.0000 mg | DELAYED_RELEASE_TABLET | Freq: Every day | ORAL | Status: DC
  Administered 2018-08-12 – 2018-08-17 (×6): 81 mg via ORAL
  Filled 2018-08-11 (×6): qty 1

## 2018-08-11 MED ORDER — AMIODARONE HCL 200 MG PO TABS: 200.0000 mg | ORAL_TABLET | Freq: Two times a day (BID) | ORAL | Status: DC

## 2018-08-11 MED ORDER — ASPIRIN EC 325 MG PO TBEC
325.0000 mg | DELAYED_RELEASE_TABLET | Freq: Every day | ORAL | Status: AC
  Administered 2018-08-11: 325 mg via ORAL

## 2018-08-11 MED ORDER — FUROSEMIDE 10 MG/ML IJ SOLN
20.0000 mg | Freq: Two times a day (BID) | INTRAMUSCULAR | Status: DC
  Administered 2018-08-11 – 2018-08-13 (×4): 20 mg via INTRAVENOUS
  Filled 2018-08-11 (×4): qty 2

## 2018-08-11 MED ORDER — ENOXAPARIN SODIUM 40 MG/0.4ML ~~LOC~~ SOLN: 40.0000 mg | Freq: Every day | SUBCUTANEOUS | Status: DC

## 2018-08-11 MED ORDER — INSULIN ASPART 100 UNIT/ML ~~LOC~~ SOLN
0.0000 [IU] | SUBCUTANEOUS | Status: DC
  Administered 2018-08-11 – 2018-08-12 (×4): 2 [IU] via SUBCUTANEOUS

## 2018-08-11 MED ORDER — INSULIN ASPART 100 UNIT/ML ~~LOC~~ SOLN
0.0000 [IU] | SUBCUTANEOUS | Status: DC
  Administered 2018-08-11: 2 [IU] via SUBCUTANEOUS

## 2018-08-11 MED ORDER — AMIODARONE HCL 200 MG PO TABS
200.0000 mg | ORAL_TABLET | Freq: Two times a day (BID) | ORAL | Status: DC
  Administered 2018-08-11 – 2018-08-14 (×8): 200 mg via ORAL
  Filled 2018-08-11 (×8): qty 1

## 2018-08-11 MED ORDER — WARFARIN - PHYSICIAN DOSING INPATIENT
Freq: Every day | Status: DC
  Administered 2018-08-11 – 2018-08-16 (×3)

## 2018-08-11 MED ORDER — WARFARIN SODIUM 2.5 MG PO TABS
2.5000 mg | ORAL_TABLET | Freq: Every day | ORAL | Status: DC
  Administered 2018-08-11 – 2018-08-13 (×3): 2.5 mg via ORAL
  Filled 2018-08-11 (×3): qty 1

## 2018-08-11 MED ORDER — SIMVASTATIN 20 MG PO TABS
20.0000 mg | ORAL_TABLET | Freq: Every day | ORAL | Status: DC
  Administered 2018-08-12 – 2018-08-17 (×6): 20 mg via ORAL
  Filled 2018-08-11 (×6): qty 1

## 2018-08-11 NOTE — Progress Notes (Signed)
Patient ID: Michele Green, female   DOB: [birth year], 77 y.o.   MRN: [id number] EVENING ROUNDS NOTE :     [address]       [place] [address]             [phone]                 1 Day Post-Op Procedure(s) (LRB): MITRAL VALVE REPAIR (MVR) (N/A) CORONARY ARTERY BYPASS GRAFTING (CABG) x 1, LIMA-LAD,  USING LEFT INTERNAL MAMMARY ARTERY. HARVESTED RIGHT GREATER SAPHENOUS VEIN ENDOSCOPICALLY (N/A) MAZE (N/A) TRANSESOPHAGEAL ECHOCARDIOGRAM (TEE) (N/A) CLIPPING OF ATRIAL APPENDAGE  Total Length of Stay:  LOS: 1 day  BP (!) 111/56 (BP Location: Right Arm)   Pulse (!) 109   Temp 98 F (36.7 C) (Oral)   Resp 15   Wt 62.2 kg   SpO2 99%   BMI 23.54 kg/m   .Intake/Output      09/19 0701 - 09/20 0700 09/20 0701 - 09/21 0700   P.O.  240   I.V. (mL/kg) 4038.6 (64.9) 194.4 (3.1)   Blood 3220.9    IV Piggyback 1647.5    Total Intake(mL/kg) 8907 (143.2) 434.4 (7)   Urine (mL/kg/hr) 3275 (2.2) 250 (0.4)   Blood 725    Chest Tube 1040 350   Total Output 5040 600   Net +3867 -165.6          . sodium chloride    . cefUROXime (ZINACEF)  IV Stopped (08/11/18 0549)  . lactated ringers 20 mL/hr at 08/11/18 1500  . lactated ringers       Lab Results  Component Value Date   WBC 9.0 08/11/2018   HGB 9.6 (L) 08/11/2018   HCT 28.3 (L) 08/11/2018   PLT 117 (L) 08/11/2018   GLUCOSE 113 (H) 08/11/2018   ALT 14 08/07/2018   AST 37 08/07/2018   NA 141 08/11/2018   K 3.8 08/11/2018   CL 110 08/11/2018   CREATININE 0.64 08/11/2018   BUN 9 08/11/2018   CO2 23 08/11/2018   TSH 1.200 05/30/2018   INR 1.70 08/10/2018   HGBA1C 4.6 (L) 08/07/2018   On lasix  On coumadin Mildly  confused    Grace Isaac MD  Beeper [phone] Office [phone] 08/11/2018 5:38 PM

## 2018-08-11 NOTE — Discharge Summary (Signed)
Physician Discharge Summary  Patient ID: Michele Green MRN: [id number] DOB/AGE: 79-Dec-1942 79 y.o.  Admit date: 08/10/2018 Discharge date: 08/17/2018  Admission Diagnoses:  Patient Active Problem List   Diagnosis Date Noted  . Coronary artery disease involving native coronary artery of native heart without angina pectoris   . Chronic diastolic HF (heart failure) ([place]) 06/20/2018  . Osteoporosis 09/29/2017  . Loosening of hardware in spine ([place]) 08/23/2017  . Genetic testing 03/17/2016  . Family history of breast cancer   . Breast cancer of upper-outer quadrant of left female breast ([place]) 02/02/2016  . On amiodarone therapy 09/09/2015  . On continuous oral anticoagulation 08/22/2015  . Persistent atrial fibrillation ([place]) 08/22/2015  . Essential hypertension 11/07/2013  . Hyperlipidemia 11/07/2013  . Severe mitral regurgitation    Discharge Diagnoses:   Patient Active Problem List   Diagnosis Date Noted  . Atypical atrial flutter ([place]) 08/14/2018  . 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 S/P mitral valve repair + CABG x1 + maze procedure 08/10/2018  . S/P Maze operation for atrial fibrillation 08/10/2018  . Coronary artery disease involving native coronary artery of native heart without angina pectoris   . Chronic diastolic HF (heart failure) ([place]) 06/20/2018  . Osteoporosis 09/29/2017  . 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 Genetic testing 03/17/2016  . Family history of breast cancer   . Breast cancer of upper-outer quadrant of left female breast ([place]) 02/02/2016  . On amiodarone therapy 09/09/2015  . On continuous oral anticoagulation 08/22/2015  . Persistent atrial fibrillation ([place]) 08/22/2015  . Essential hypertension 11/07/2013  . Hyperlipidemia 11/07/2013  . Severe mitral regurgitation    Discharged Condition: good  History of Present Illness:  Michele Green is a 77 year old female with history of mitral regurgitation, long-standing persistent atrial  fibrillation on oral anticoagulation, hypertension, chronic diastolic congestive heart failure, previous spine surgery for management ofcompression fracture of the lumbar spine, and remote history of breast cancer who has been referred for surgical consultation to discuss treatment options for management of severe symptomatic primary mitral regurgitation, coronary artery disease, and persistent atrial fibrillation.  Patient states that she has known of presence of a heart murmur for at least 4 years. She describes chronic symptoms of exertional shortness of breath and fatigue for a similar amount of time. In 2016 she was diagnosed with persistent atrial fibrillation and she has been cared for by [name] ever since. She underwent attempted DC cardioversion but failed to maintain sinus rhythm. She has been treated with rate control and long-term anticoagulation using warfarin. Previous echocardiograms have documented the presence of normal left ventricular systolic function with what was felt to be mild mitral regurgitation. She underwent spine surgery for lumbar compression fracturewith complications, ultimately needing redo spine surgery to remove the previously placed screws. Her physical recovery following this was slow. During the same time her husband was ill with cancer and ultimately passed away. Since that time she has had progressing symptoms of exertional shortness of breath without chest pain or chest tightness. Symptoms progressed to the point where she developed shortness of breath with very mild activity, orthopnea, and lower extremity edema a few months ago. She was started on oral Lasix and symptoms have improved, but she continues to get short of breath and fatigued with moderate level activity. This limits her daily activities considerably. She was seen in follow-up recently and transthoracic echocardiogram revealed normal left ventricular systolic function with what was felt to  be at least  moderate if not severe mitral regurgitation. She underwent TEE and diagnostic cardiac catheterization on June 20, 2018. TEE revealed normal left ventricular systolic function with severe mitral regurgitation and severe left atrial enlargement. Catheterization revealed severe multivessel coronary artery disease with mild pulmonary hypertension.  She was referred to [facility] for surgical evaluation.  She was evaluated by Dr. Roxy Manns at which time she admitted to being limited primarily by exertional shortness of breath and fatigue. She does not have problems walking. She notes that her recovery following her spine surgery was very slow, but she now has only very mild pain in her back. She denies any history of exertional chest pain or chest tightness. She gets short of breath with moderate level activity. She denies resting shortness of breath, PND, orthopnea, dizzy spells, or syncope. She has had some orthopnea and lower extremity edema previously, but this has improved on oral diuretics. She has some occasional tachypalpitations. She has been chronically anticoagulated using Xarelto ever since 2016.  It was felt she should undergo MAZE procedure, MV repair and coronary bypass surgery.  The risks and benefits of the procedure were explained to the patient and she was agreeable to proceed.  Hospital Course:   Michele Green presented to [facility] on 08/10/2018.  She was taken to the operating room and underwent CABG x 1 utilizing LIMA to LAD, Mitral Valve Repair, and Complete MAZE procedure.  She tolerated the procedure without difficulty and was taken to the SICU in stable condition.  She was extubated the evening of surgery.  During her stay in the SICU the patient was weaned off Milrinone and Neo-synephrine as tolerated.  Her chest tubes and arterial lines were removed without difficulty.  She was started on low dose coumadin for her Mitral Valve Repair.  She was in sinus tachycardia,  lopressor was restarted and she was treated with prophylactic Amiodarone for her MAZE procedure.  She developed diarrhea due to use of stool softeners.  These were discontinued.  She was medically stable for transfer to the stepdown unit on 08/13/2018.  She continues to make progress.  Her pacing wires have been removed without difficulty.   She remains on coumadin at 1 mg daily.  Her most recent INR is 2.45.  Her INR should be 2.5-3.0.  The results will need to be faxed to [facility] [phone].  She developed minor sternal drainage.  This appeared to be fat necrosis and no antibiotics are being prescribed.  She lives alone and is mildly deconditioned.  Physical therapy did recommend SNF placement, which has been arranged.  Her incisions are healing without evidence of infection.  She is ambulating with assistance.  She is maintaining NSR.  She is medically stable for discharge home today.    Significant Diagnostic Studies:    Severe mitral regurgitation with marked enlargement of the left atrium.  Mild pulmonary hypertension with mean pulmonary arterial pressure 28 mmHg.  Phasic pressure 45/16 mmHg  Three-vessel coronary calcification with significant greater than 80% distal circumflex, 90% first diagonal ostium, and 80 to 90% mid LAD.  Normal LV systolic function with EF 50 to 55%.  LVEDP 11 mmHg  Cardiac output 4.3 L/min/cardiac index 2.7 L/min/m  - Left ventricle: The cavity size was normal. There was mild   concentric hypertrophy. Systolic function was normal. The   estimated ejection fraction was in the range of 60% to 65%. Wall   motion was normal; there were no regional wall motion   abnormalities. -  Aorta: Mild atheromatous disease. - Mitral valve: Severe regurgitation. Degenerate leafelts with mild   posterior leaflet prolapse - no annular dilitation. Effective   regurgitant orifice (PISA): 0.82 cm^2. Regurgitant volume (PISA):   113 ml. - Left atrium: Massively dilated. -  Pulmonary veins: No anomaly. Reversal of flow noted in the LUPV. - Right atrium: No evidence of thrombus in the atrial cavity or   appendage. - Atrial septum: No defect or patent foramen ovale was identified.  Impressions:  - Severe mitral regurgitation. No LAA thrombus. Massive LAE. LVEF   60-65%.  Treatments: surgery:    Mitral Valve Repair             Complex valvuloplasty including Gore-tex neochord placement x8             Decalcification of mitral annulus             Plication of posterior commissure             Sorin Memo 4D ring annuloplasty (size 67m, model #4DM-28, serial ##[id number]   Coronary Artery Bypass Grafting x 1              Left Internal Mammary Artery to Distal Left Anterior Descending Coronary Artery             Endoscopic Vein Harvest from Right Thigh    Maze Procedure              complete bilateral atrial lesion set using bipolar radiofrequency and cryothermy ablation             clipping of left atrial appendage (Atricure Pro245 left atrial clip, size 425m  Discharge Exam: Blood pressure (!) 148/75, pulse (!) 112, temperature 98.2 F (36.8 C), temperature source Oral, resp. rate 20, height 5' 4"  (1.626 m), weight 59.2 kg, SpO2 93 %.  General appearance: alert, cooperative and no distress Heart: irregularly irregular rhythm Lungs: diminished breath sounds bibasilar Abdomen: soft, non-tender; bowel sounds normal; no masses,  no organomegaly Extremities: edema trace Wound: sternal minimal erythema, mild drainage present, looks like fat necrosis  Disposition: Home  Discharge Medications:  The patient has been discharged on:   1.Beta Blocker:  Yes [ x  ]                              No   [   ]                              If No, reason:  2.Ace Inhibitor/ARB: Yes [   ]                                     No  [  x  ]                                     If No, reason:  3.Statin:   Yes [ x  ]                  No  [   ]                  If  No, reason:  4.Ecasa:  Yes  [x   ]  No   [   ]                  If No, reason:     Discharge Instructions    Amb Referral to Cardiac Rehabilitation   Complete by:  As directed    Diagnosis:   CABG Valve Repair     Valve:  Mitral   CABG X ___:  1     Allergies as of 08/17/2018      Reactions   Shellfish Allergy Itching      Medication List    STOP taking these medications   XARELTO 20 MG Tabs tablet Generic drug:  rivaroxaban     TAKE these medications   acetaminophen 325 MG tablet Commonly known as:  TYLENOL Take 2 tablets (650 mg total) by mouth every 6 (six) hours as needed for mild pain or fever.   allopurinol 100 MG tablet Commonly known as:  ZYLOPRIM Take 200 mg by mouth daily.   amiodarone 400 MG tablet Commonly known as:  PACERONE Take 1 tablet (400 mg total) by mouth 2 (two) times daily. X 7 days, then decrease to 200 mg BID x 7 days, then decrease to 200 mg daily   aspirin 81 MG EC tablet Take 1 tablet (81 mg total) by mouth daily.   Digoxin 62.5 MCG Tabs Take 0.0625 mg by mouth daily. What changed:    medication strength  additional instructions   furosemide 40 MG tablet Commonly known as:  LASIX Take 1 tablet (40 mg total) by mouth daily. For 7 Days, then resume patients dose of 20 mg daily What changed:    medication strength  how much to take  additional instructions   LIALDA 1.2 g EC tablet Generic drug:  mesalamine Take 4.8 g by mouth daily.   metoprolol tartrate 100 MG tablet Commonly known as:  LOPRESSOR Take 1 tablet (100 mg total) by mouth 2 (two) times daily.   potassium chloride SA 20 MEQ tablet Commonly known as:  K-DUR,KLOR-CON Take 1 tablet (20 mEq total) by mouth daily. What changed:  when to take this   simvastatin 20 MG tablet Commonly known as:  ZOCOR Take 20 mg by mouth daily.   traMADol 50 MG tablet Commonly known as:  ULTRAM Take 1 tablet (50 mg total) by mouth every 4 (four) hours as  needed for moderate pain.   warfarin 1 MG tablet Commonly known as:  COUMADIN Take 1 tablet (1 mg total) by mouth daily at 6 PM.       Contact information for follow-up providers    Rexene Alberts, MD Follow up on 09/11/2018.   Specialty:  Cardiothoracic Surgery Why:  Appointment is at 1:00, please get CXR at 12:30 at [place] located on first floor of our office building Contact information: [place] [address] [phone]        [facility] [address] [facility] Follow up.   Specialty:  Cardiology Why:  Please contact office at day of discharge, to set up PT/INR check within 48 hours  Contact information: [address] [place] [phone]       [facility] [facility] Follow up on 08/24/2018.   Specialty:  Cardiothoracic Surgery Why:  Appointment is at 2:30, please get CXR at 2:00 at [place] located on first floor of our office building Contact information: [place], [place]  [place]       Richardson Dopp T, PA-C Follow up on 08/22/2018.   Specialties:  Cardiology, Physician Assistant Why:  Please arrive 15 minutes early for your 8:45am post-hospital cardiology appointment Contact information: [address] [place] [address] [phone]            Contact information for after-discharge care    Destination    [facility] Preferred SNF .   Service:  Skilled Nursing Contact information: [place] [phone]                  Signed: Ellwood Handler 08/17/2018, 9:00 AM

## 2018-08-11 NOTE — Plan of Care (Signed)
  Problem: Education: Goal: Will demonstrate proper wound care and an understanding of methods to prevent future damage Outcome: Progressing Goal: Knowledge of disease or condition will improve Outcome: Progressing   Problem: Activity: Goal: Risk for activity intolerance will decrease Outcome: Progressing   Problem: Cardiac: Goal: Will achieve and/or maintain hemodynamic stability Outcome: Progressing   Problem: Respiratory: Goal: Respiratory status will improve Outcome: Progressing   Problem: Skin Integrity: Goal: Wound healing without signs and symptoms of infection Outcome: Progressing Goal: Risk for impaired skin integrity will decrease Outcome: Progressing   Problem: Urinary Elimination: Goal: Ability to achieve and maintain adequate renal perfusion and functioning will improve Outcome: Progressing

## 2018-08-11 NOTE — Progress Notes (Addendum)
TCTS DAILY ICU PROGRESS NOTE                   [address]            [place] [address]          [phone]   1 Day Post-Op Procedure(s) (LRB): MITRAL VALVE REPAIR (MVR) (N/A) CORONARY ARTERY BYPASS GRAFTING (CABG) x 1, LIMA-LAD,  USING LEFT INTERNAL MAMMARY ARTERY. HARVESTED RIGHT GREATER SAPHENOUS VEIN ENDOSCOPICALLY (N/A) MAZE (N/A) TRANSESOPHAGEAL ECHOCARDIOGRAM (TEE) (N/A) CLIPPING OF ATRIAL APPENDAGE  Total Length of Stay:  LOS: 1 day   Subjective:  Patient with complaints of back pain all evening.  She a history of chronic back problems due to fracture and surgery with complications.  Pain medication is providing relief.  She had some nausea which resolved.  Objective: Vital signs in last 24 hours: Temp:  [95.7 F (35.4 C)-99.3 F (37.4 C)] 98.4 F (36.9 C) (09/20 0700) Pulse Rate:  [49-114] 110 (09/20 0700) Cardiac Rhythm: Sinus tachycardia (09/20 0645) Resp:  [10-38] 15 (09/20 0700) BP: (83-123)/(56-84) 101/58 (09/20 0700) SpO2:  [90 %-100 %] 95 % (09/20 0700) Arterial Line BP: (82-151)/(48-82) 108/52 (09/20 0700) FiO2 (%):  [40 %-100 %] 40 % (09/19 1807) Weight:  [62.2 kg] 62.2 kg (09/20 0500)  Filed Weights   08/11/18 0500  Weight: 62.2 kg    Weight change:    Hemodynamic parameters for last 24 hours: PAP: (36-82)/(19-52) 46/22 CO:  [2.1 L/min-6.1 L/min] 5.6 L/min CI:  [1.3 L/min/m2-3.8 L/min/m2] 3.5 L/min/m2  Intake/Output from previous day: 09/19 0701 - 09/20 0700 In: 8907 [I.V.:4038.6; Blood:3220.9; IV Piggyback:1647.5] Out: 5040 [Urine:3275; Blood:725; Chest Tube:1040]  Current Meds: Scheduled Meds: . acetaminophen  1,000 mg Oral Q6H  . allopurinol  200 mg Oral Daily  . amiodarone  200 mg Oral BID  . aspirin EC  325 mg Oral Daily  . bisacodyl  10 mg Oral Daily   Or  . bisacodyl  10 mg Rectal Daily  . chlorhexidine gluconate (MEDLINE KIT)  15 mL Mouth Rinse BID  . Chlorhexidine Gluconate Cloth  6 each Topical Daily  .  docusate sodium  200 mg Oral Daily  . enoxaparin (LOVENOX) injection  40 mg Subcutaneous QHS  . insulin aspart  0-24 Units Subcutaneous Q4H  . insulin aspart  0-24 Units Subcutaneous Q4H  . metoprolol tartrate  12.5 mg Oral BID  . [START ON 08/12/2018] pantoprazole  40 mg Oral Daily  . sodium chloride flush  10-40 mL Intracatheter Q12H  . sodium chloride flush  3 mL Intravenous Q12H  . warfarin  2.5 mg Oral q1800  . Warfarin - Physician Dosing Inpatient   Does not apply q1800   Continuous Infusions: . sodium chloride    . albumin human 12.5 g (08/10/18 1523)  . cefUROXime (ZINACEF)  IV Stopped (08/11/18 0549)  . lactated ringers 20 mL/hr at 08/11/18 0700  . lactated ringers    . milrinone 0.3 mcg/kg/min (08/11/18 0700)  . phenylephrine (NEO-SYNEPHRINE) Adult infusion 20 mcg/min (08/11/18 0700)   PRN Meds:.albumin human, metoprolol tartrate, morphine injection, ondansetron (ZOFRAN) IV, oxyCODONE, sodium chloride flush, sodium chloride flush, traMADol  General appearance: alert, cooperative and no distress Heart: regular rate and rhythm Lungs: clear to auscultation bilaterally Abdomen: soft, non-tender; bowel sounds normal; no masses,  no organomegaly Extremities: edema trace Wound: clean and dry EVH, extensive ecchymosis Right thigh, aquacel in place on sternotomy  Lab Results: CBC: Recent Labs    08/10/18 2044  08/10/18 2108 08/11/18 0310  WBC 7.1  --  9.0  HGB 9.4* 8.5* 9.6*  HCT 27.3* 25.0* 28.3*  PLT 130*  --  117*   BMET:  Recent Labs    08/10/18 2108 08/11/18 0310  NA 142 141  K 3.9 3.8  CL 109 110  CO2  --  23  GLUCOSE 153* 113*  BUN 10 9  CREATININE 0.50 0.64  CALCIUM  --  7.0*    CMET: Lab Results  Component Value Date   WBC 9.0 08/11/2018   HGB 9.6 (L) 08/11/2018   HCT 28.3 (L) 08/11/2018   PLT 117 (L) 08/11/2018   GLUCOSE 113 (H) 08/11/2018   ALT 14 08/07/2018   AST 37 08/07/2018   NA 141 08/11/2018   K 3.8 08/11/2018   CL 110 08/11/2018    CREATININE 0.64 08/11/2018   BUN 9 08/11/2018   CO2 23 08/11/2018   TSH 1.200 05/30/2018   INR 1.70 08/10/2018   HGBA1C 4.6 (L) 08/07/2018      PT/INR:  Recent Labs    08/10/18 1440  LABPROT 19.8*  INR 1.70   Radiology: Dg Chest Port 1 View  Result Date: 08/10/2018 CLINICAL DATA:  Status post mitral valve repair EXAM: PORTABLE CHEST 1 VIEW COMPARISON:  08/07/2018, CT 08/01/2018 FINDINGS: Endotracheal tube tip is about 2.6 cm superior to the carina. Esophageal tube tip below the diaphragm but non included on the image. Interval sternotomy changes and valve prosthesis. Bilateral and mediastinal chest drainage catheters. No pneumothorax. Right IJ Swan-Ganz catheter tip overlies the pulmonary outflow tract. Trace pleural effusions and left basilar atelectasis. Stable borderline cardiomegaly with aortic atherosclerosis. IMPRESSION: 1. Interval placement of support lines and tubes as above. Interval postsurgical changes of the mediastinum. 2. Trace pleural effusions with streaky atelectasis at the left base. Electronically Signed   By: Donavan Foil M.D.   On: 08/10/2018 15:47     Assessment/Plan: S/P Procedure(s) (LRB): MITRAL VALVE REPAIR (MVR) (N/A) CORONARY ARTERY BYPASS GRAFTING (CABG) x 1, LIMA-LAD,  USING LEFT INTERNAL MAMMARY ARTERY. HARVESTED RIGHT GREATER SAPHENOUS VEIN ENDOSCOPICALLY (N/A) MAZE (N/A) TRANSESOPHAGEAL ECHOCARDIOGRAM (TEE) (N/A) CLIPPING OF ATRIAL APPENDAGE  1. CV- Sinus Tach- stop Milrinone, Neo today- start oral Amiodarone 200 mg BID for MAZE prophylaxis, start oral Lopressor at 12.5 mg daily, start coumadin at 2.5 mg for MV Repair,  2. Pulm- no acute issues, wean oxygen as tolerated, CXR with bilateral atelectasis, work on IS 3. Renal- creatinine WNL, minimal edema on exam, will hold off on diuretics for now 4. Expected post operative blood loss anemia, mild Hgb at 9.6, monitor 5. Expected post operative Thrombocytopenia, at 117 monitor 6. CBGs controlled,  not on insulin drip, will start SSIP 7. Deconditioning- patient lives alone, chronic problems with her back due to fractures and complicated surgical treatment/recovery, will place PT consult as she will require placement at discharge 8. Dispo- patient stable, weaning off drips today, start Amiodarone, Coumadin, Lopressor today, hold off on diuretics, POD #1 progression orders, leave chest tubes in place until tomorrow     Ellwood Handler 08/11/2018 7:58 AM   I have seen and examined the patient and agree with the assessment and plan as outlined.  Overall looks remarkably good.  Maintaining sinus tach vs ectopic atrial tach w/ stable hemodynamics on low dose milrinone 0.25 and minimal dose Neo.  Breathing comfortably w/ O2 sats 97% on 4 L/min and CXR looks good.   Wean drips off  Mobilize and d/c lines  Start  low dose beta blocker  Start amiodarone D/C tubes later today or tomorrow, depending on output  Start lasix later today or tomorrow to stimulate diuresis  Start Coumadin  PT consult once tubes out to assist w/ mobility and ultimately placement for hospital d/c  Rexene Alberts, MD 08/11/2018 8:18 AM

## 2018-08-12 ENCOUNTER — Inpatient Hospital Stay (HOSPITAL_COMMUNITY): Payer: Medicare Other

## 2018-08-12 LAB — CBC
HCT: 24.2 % — ABNORMAL LOW (ref 36.0–46.0)
Hemoglobin: 7.7 g/dL — ABNORMAL LOW (ref 12.0–15.0)
MCH: 33.9 pg (ref 26.0–34.0)
MCHC: 31.8 g/dL (ref 30.0–36.0)
MCV: 106.6 fL — ABNORMAL HIGH (ref 78.0–100.0)
PLATELETS: 80 10*3/uL — AB (ref 150–400)
RBC: 2.27 MIL/uL — ABNORMAL LOW (ref 3.87–5.11)
RDW: 20.5 % — AB (ref 11.5–15.5)
WBC: 9.2 10*3/uL (ref 4.0–10.5)

## 2018-08-12 LAB — BASIC METABOLIC PANEL
ANION GAP: 4 — AB (ref 5–15)
Anion gap: 8 (ref 5–15)
BUN: 9 mg/dL (ref 8–23)
BUN: 13 mg/dL (ref 8–23)
CALCIUM: 4.7 mg/dL — AB (ref 8.9–10.3)
CHLORIDE: 106 mmol/L (ref 98–111)
CO2: 18 mmol/L — ABNORMAL LOW (ref 22–32)
CO2: 24 mmol/L (ref 22–32)
CREATININE: 0.59 mg/dL (ref 0.44–1.00)
CREATININE: 0.86 mg/dL (ref 0.44–1.00)
Calcium: 6.8 mg/dL — ABNORMAL LOW (ref 8.9–10.3)
Chloride: 118 mmol/L — ABNORMAL HIGH (ref 98–111)
GFR calc Af Amer: >60 mL/min (ref >60)
GFR calc non Af Amer: >60 mL/min (ref >60)
GLUCOSE: 83 mg/dL (ref 70–99)
Glucose, Bld: 108 mg/dL — ABNORMAL HIGH (ref 70–99)
POTASSIUM: 2.8 mmol/L — AB (ref 3.5–5.1)
Potassium: 4 mmol/L (ref 3.5–5.1)
SODIUM: 138 mmol/L (ref 135–145)
Sodium: 140 mmol/L (ref 135–145)

## 2018-08-12 LAB — GLUCOSE, CAPILLARY
GLUCOSE-CAPILLARY: 102 mg/dL — AB (ref 70–99)
GLUCOSE-CAPILLARY: 111 mg/dL — AB (ref 70–99)
GLUCOSE-CAPILLARY: 117 mg/dL — AB (ref 70–99)
GLUCOSE-CAPILLARY: 122 mg/dL — AB (ref 70–99)
Glucose-Capillary: 76 mg/dL (ref 70–99)
Glucose-Capillary: 107 mg/dL — ABNORMAL HIGH (ref 70–99)

## 2018-08-12 LAB — PROTIME-INR
INR: 1.58
Prothrombin Time: 18.8 seconds — ABNORMAL HIGH (ref 11.4–15.2)

## 2018-08-12 MED ORDER — SODIUM CHLORIDE 0.9% FLUSH
10.0000 mL | Freq: Two times a day (BID) | INTRAVENOUS | Status: DC
  Administered 2018-08-12 – 2018-08-14 (×5): 10 mL
  Administered 2018-08-14: 20 mL
  Administered 2018-08-15 – 2018-08-16 (×2): 10 mL
  Administered 2018-08-16: 20 mL

## 2018-08-12 MED ORDER — CHLORHEXIDINE GLUCONATE CLOTH 2 % EX PADS: 6.0000 | MEDICATED_PAD | Freq: Every day | CUTANEOUS | Status: DC

## 2018-08-12 MED ORDER — POTASSIUM CHLORIDE CRYS ER 20 MEQ PO TBCR
20.0000 meq | EXTENDED_RELEASE_TABLET | Freq: Two times a day (BID) | ORAL | Status: DC
  Administered 2018-08-12 (×2): 20 meq via ORAL
  Filled 2018-08-12 (×2): qty 1

## 2018-08-12 MED ORDER — SODIUM CHLORIDE 0.9% FLUSH
10.0000 mL | INTRAVENOUS | Status: DC | PRN
  Administered 2018-08-15: 30 mL
  Administered 2018-08-16: 10 mL
  Filled 2018-08-12 (×2): qty 40

## 2018-08-12 NOTE — Evaluation (Signed)
Physical Therapy Evaluation Patient Details Name: Michele Green MRN: [id number] DOB: Aug 02, 1941 Today's Date: 08/12/2018   History of Present Illness  77 y.o. female s/p CABGx1, MVR, Maze 08/10/18. PMH includes: HTN, breast CA, back surgery.   Clinical Impression  Patient is s/p above surgery resulting in functional limitations due to the deficits listed below (see PT Problem List). PTA pt indepednent living alone not using AD for mobility. Today pt with post op pain and weakness, limited activity tolerence. Pt anxious upon entry witnessed attemptin to exit bed with family and poor line mgt. When asked, no partciluar reason she was in a hurry, unsure what level of baseline anxiety is. Today, pt ambulated 190' with eva walker, SpO2 WNL on .5L, HR 115-129. Verbal cues needed for sternal precautions throughout session, will benefit from SNF prior to return to home.  Patient will benefit from skilled PT to increase their independence and safety with mobility to allow discharge to the venue listed below.        Follow Up Recommendations SNF    Equipment Recommendations  (TBD next venue)    Recommendations for Other Services       Precautions / Restrictions Precautions Precautions: Sternal;Fall Precaution Booklet Issued: No Precaution Comments: reviewed sternal precautions with family and patient Restrictions Weight Bearing Restrictions: Yes Other Position/Activity Restrictions: Sternal precautions       Mobility  Bed Mobility Overal bed mobility: Needs Assistance Bed Mobility: Supine to Sit;Sit to Supine     Supine to sit: Min assist Sit to supine: Min assist      Transfers Overall transfer level: Needs assistance Equipment used: Ethelene Hal) Transfers: Sit to/from Stand Sit to Stand: Min guard         General transfer comment: cues for hand placement to adhere to sternal precautions.   Ambulation/Gait Ambulation/Gait assistance: Min guard Gait Distance (Feet): 190  Feet Assistive device: 4-wheeled walker(Eva Walker) Gait Pattern/deviations: Step-to pattern;Step-through pattern Gait velocity: decreased   General Gait Details: Pt ambulating with eva walker this visit, SpO2 WNL on .5 L, no DOE or chest pain, HR 115-171mx.  Stairs            Wheelchair Mobility    Modified Rankin (Stroke Patients Only)       Balance Overall balance assessment: Mild deficits observed, not formally tested                                           Pertinent Vitals/Pain      Home Living Family/patient expects to be discharged to:: Skilled nursing facility                      Prior Function Level of Independence: Independent         Comments: lives alone, independent without AD      Hand Dominance        Extremity/Trunk Assessment   Upper Extremity Assessment Upper Extremity Assessment: Overall WFL for tasks assessed    Lower Extremity Assessment Lower Extremity Assessment: Overall WFL for tasks assessed       Communication   Communication: No difficulties  Cognition Arousal/Alertness: Awake/alert Behavior During Therapy: Anxious Overall Cognitive Status: Within Functional Limits for tasks assessed  General Comments: pt anxious and slightly irritiable, per RN has been this way all day. unsure baslinse level of anxiety.       General Comments      Exercises     Assessment/Plan    PT Assessment Patient needs continued PT services  PT Problem List Decreased strength;Decreased range of motion;Decreased activity tolerance;Decreased balance;Decreased mobility;Pain       PT Treatment Interventions DME instruction;Gait training;Stair training;Functional mobility training;Therapeutic activities;Therapeutic exercise    PT Goals (Current goals can be found in the Care Plan section)  Acute Rehab PT Goals Patient Stated Goal: go to SNF PT Goal Formulation:  With patient/family Time For Goal Achievement: 08/19/18 Potential to Achieve Goals: Good    Frequency Min 3X/week   Barriers to discharge   lives alone    Co-evaluation               AM-PAC PT "6 Clicks" Daily Activity  Outcome Measure Difficulty turning over in bed (including adjusting bedclothes, sheets and blankets)?: Unable Difficulty moving from lying on back to sitting on the side of the bed? : Unable Difficulty sitting down on and standing up from a chair with arms (e.g., wheelchair, bedside commode, etc,.)?: Unable Help needed moving to and from a bed to chair (including a wheelchair)?: A Lot Help needed walking in hospital room?: A Lot Help needed climbing 3-5 steps with a railing? : Total 6 Click Score: 8    End of Session Equipment Utilized During Treatment: Gait belt;Oxygen(.5L) Activity Tolerance: Patient tolerated treatment well Patient left: in bed;with call bell/phone within reach;with family/visitor present Nurse Communication: Mobility status PT Visit Diagnosis: Unsteadiness on feet (R26.81)    Time: 1975-8832 PT Time Calculation (min) (ACUTE ONLY): 52 min   Charges:   PT Evaluation $PT Eval Low Complexity: 1 Low PT Treatments $Gait Training: 8-22 mins $Therapeutic Activity: 8-22 mins        Reinaldo Berber, PT, DPT Acute Rehabilitation Services Pager: [phone] Office: [place] 08/12/2018, 5:15 PM

## 2018-08-12 NOTE — Progress Notes (Signed)
Spoke with Godfrey Pick RN re PICC order,  States will check with MD re PICC prior to placement.  Will check with RN at a later time.

## 2018-08-12 NOTE — Plan of Care (Signed)
  Problem: Respiratory: Goal: Respiratory status will improve Outcome: Progressing   Problem: Activity: Goal: Risk for activity intolerance will decrease Outcome: Progressing   Problem: Elimination: Goal: Will not experience complications related to urinary retention Outcome: Progressing   Problem: Pain Managment: Goal: General experience of comfort will improve Outcome: Progressing

## 2018-08-12 NOTE — Progress Notes (Signed)
Patient ID: Michele Green, female   DOB: 04-Nov-1941, 77 y.o.   MRN: [id number] TCTS DAILY ICU PROGRESS NOTE                   [address]            [place] [address]          [phone]   2 Days Post-Op Procedure(s) (LRB): MITRAL VALVE REPAIR (MVR) (N/A) CORONARY ARTERY BYPASS GRAFTING (CABG) x 1, LIMA-LAD,  USING LEFT INTERNAL MAMMARY ARTERY. HARVESTED RIGHT GREATER SAPHENOUS VEIN ENDOSCOPICALLY (N/A) MAZE (N/A) TRANSESOPHAGEAL ECHOCARDIOGRAM (TEE) (N/A) CLIPPING OF ATRIAL APPENDAGE  Total Length of Stay:  LOS: 2 days   Subjective: Patient awake alert neurologically intact, up in chair this morning, mild confusion noted last night is now completely resolved.  She is aware of her medications and was asking if she was in atrial fib again.  Objective: Vital signs in last 24 hours: Temp:  [97.6 F (36.4 C)-98.7 F (37.1 C)] 98.7 F (37.1 C) (09/21 0748) Pulse Rate:  [96-114] 111 (09/21 0700) Cardiac Rhythm: Sinus tachycardia (09/21 0400) Resp:  [7-18] 10 (09/21 0700) BP: (87-119)/(54-91) 103/91 (09/21 0700) SpO2:  [94 %-99 %] 97 % (09/21 0700) Arterial Line BP: (102)/(51) 102/51 (09/20 0900) Weight:  [62.8 kg] 62.8 kg (09/21 0600)  Filed Weights   08/11/18 0500 08/12/18 0600  Weight: 62.2 kg 62.8 kg    Weight change: 0.6 kg   Hemodynamic parameters for last 24 hours:    Intake/Output from previous day: 09/20 0701 - 09/21 0700 In: 913.3 [P.O.:240; I.V.:473.4; IV Piggyback:199.9] Out: 1420 [Urine:660; Chest Tube:760]  Intake/Output this shift: No intake/output data recorded.  Current Meds: Scheduled Meds: . acetaminophen  1,000 mg Oral Q6H  . allopurinol  200 mg Oral Daily  . amiodarone  200 mg Oral BID WC  . aspirin EC  81 mg Oral Daily  . bisacodyl  10 mg Oral Daily   Or  . bisacodyl  10 mg Rectal Daily  . chlorhexidine gluconate (MEDLINE KIT)  15 mL Mouth Rinse BID  . Chlorhexidine Gluconate Cloth  6 each Topical Daily  . docusate  sodium  200 mg Oral Daily  . enoxaparin (LOVENOX) injection  40 mg Subcutaneous QHS  . furosemide  20 mg Intravenous BID  . insulin aspart  0-24 Units Subcutaneous Q4H  . metoprolol tartrate  12.5 mg Oral BID  . pantoprazole  40 mg Oral Daily  . simvastatin  20 mg Oral Daily  . sodium chloride flush  10-40 mL Intracatheter Q12H  . sodium chloride flush  3 mL Intravenous Q12H  . warfarin  2.5 mg Oral q1800  . Warfarin - Physician Dosing Inpatient   Does not apply q1800   Continuous Infusions: . sodium chloride    . lactated ringers 20 mL/hr at 08/12/18 0700  . lactated ringers     PRN Meds:.metoprolol tartrate, morphine injection, ondansetron (ZOFRAN) IV, oxyCODONE, sodium chloride flush, sodium chloride flush, traMADol  General appearance: alert, cooperative, appears stated age and no distress Neurologic: intact Heart: Accelerated junctional rhythm with retrograde P waves Lungs: diminished breath sounds bibasilar Abdomen: soft, non-tender; bowel sounds normal; no masses,  no organomegaly Extremities: extremities normal, atraumatic, no cyanosis or edema and Homans sign is negative, no sign of DVT Wound: Dressing intact sternum stable  Lab Results: CBC: Recent Labs    08/11/18 1750 08/11/18 1751 08/12/18 0348  WBC 11.8*  --  9.2  HGB 10.1* 9.9* 7.7*  HCT 31.7* 29.0* 24.2*  PLT 109*  --  80*   BMET:  Recent Labs    08/12/18 0348 08/12/18 0530  NA 140 138  K 2.8* 4.0  CL 118* 106  CO2 18* 24  GLUCOSE 83 108*  BUN 9 13  CREATININE 0.59 0.86  CALCIUM 4.7* 6.8*    CMET: Lab Results  Component Value Date   WBC 9.2 08/12/2018   HGB 7.7 (L) 08/12/2018   HCT 24.2 (L) 08/12/2018   PLT 80 (L) 08/12/2018   GLUCOSE 108 (H) 08/12/2018   ALT 14 08/07/2018   AST 37 08/07/2018   NA 138 08/12/2018   K 4.0 08/12/2018   CL 106 08/12/2018   CREATININE 0.86 08/12/2018   BUN 13 08/12/2018   CO2 24 08/12/2018   TSH 1.200 05/30/2018   INR 1.58 08/12/2018   HGBA1C 4.6 (L)  08/07/2018      PT/INR:  Recent Labs    08/12/18 0348  LABPROT 18.8*  INR 1.58   Radiology: [place] Ekg Site Rite  Result Date: 08/11/2018 If Site Rite image not attached, placement could not be confirmed due to current cardiac rhythm.    Assessment/Plan: S/P Procedure(s) (LRB): MITRAL VALVE REPAIR (MVR) (N/A) CORONARY ARTERY BYPASS GRAFTING (CABG) x 1, LIMA-LAD,  USING LEFT INTERNAL MAMMARY ARTERY. HARVESTED RIGHT GREATER SAPHENOUS VEIN ENDOSCOPICALLY (N/A) MAZE (N/A) TRANSESOPHAGEAL ECHOCARDIOGRAM (TEE) (N/A) CLIPPING OF ATRIAL APPENDAGE Mobilize Diuresis d/c tubes/lines-minimal drainage from chest tubes past 12 hours will removed today Patient starts on 2.5 mg of Coumadin today Expected blood loss anemia-we will monitor try to avoid transfusion Mild thrombocytopenia-avoid heparin exposure    Grace Isaac 08/12/2018 8:07 AM

## 2018-08-12 NOTE — Progress Notes (Addendum)
Patient ID: Michele Green, female   DOB: 1940-12-23, 77 y.o.   MRN: [id number] EVENING ROUNDS NOTE :     [address]       [place] [address]             [phone]                 2 Days Post-Op Procedure(s) (LRB): MITRAL VALVE REPAIR (MVR) (N/A) CORONARY ARTERY BYPASS GRAFTING (CABG) x 1, LIMA-LAD,  USING LEFT INTERNAL MAMMARY ARTERY. HARVESTED RIGHT GREATER SAPHENOUS VEIN ENDOSCOPICALLY (N/A) MAZE (N/A) TRANSESOPHAGEAL ECHOCARDIOGRAM (TEE) (N/A) CLIPPING OF ATRIAL APPENDAGE  Total Length of Stay:  LOS: 2 days  BP (!) 149/59   Pulse (!) 120   Temp 98.1 F (36.7 C) (Oral)   Resp 15   Wt 62.8 kg   SpO2 96%   BMI 23.76 kg/m   .Intake/Output      09/20 0701 - 09/21 0700 09/21 0701 - 09/22 0700   P.O. 240    I.V. (mL/kg) 473.4 (7.5) 178.9 (2.8)   Blood     IV Piggyback 199.9    Total Intake(mL/kg) 913.3 (14.5) 178.9 (2.8)   Urine (mL/kg/hr) 660 (0.4) 1140 (1.5)   Blood     Chest Tube 760    Total Output 1420 1140   Net -506.8 -961.2          . sodium chloride    . lactated ringers 20 mL/hr at 08/12/18 1800  . lactated ringers       Lab Results  Component Value Date   WBC 9.2 08/12/2018   HGB 7.7 (L) 08/12/2018   HCT 24.2 (L) 08/12/2018   PLT 80 (L) 08/12/2018   GLUCOSE 108 (H) 08/12/2018   ALT 14 08/07/2018   AST 37 08/07/2018   NA 138 08/12/2018   K 4.0 08/12/2018   CL 106 08/12/2018   CREATININE 0.86 08/12/2018   BUN 13 08/12/2018   CO2 24 08/12/2018   TSH 1.200 05/30/2018   INR 1.58 08/12/2018   HGBA1C 4.6 (L) 08/07/2018   Stable day pic line in , right ij to come out    Grace Isaac MD  Beeper [phone] Office [phone] 08/12/2018 7:00 PM

## 2018-08-12 NOTE — Progress Notes (Signed)
Spoke with Godfrey Pick RN re PICC.  States PICC order to be d/c'ed.

## 2018-08-12 NOTE — Progress Notes (Signed)
Peripherally Inserted Central Catheter/Midline Placement  The IV Nurse has discussed with the patient and/or persons authorized to consent for the patient, the purpose of this procedure and the potential benefits and risks involved with this procedure.  The benefits include less needle sticks, lab draws from the catheter, and the patient may be discharged home with the catheter. Risks include, but not limited to, infection, bleeding, blood clot (thrombus formation), and puncture of an artery; nerve damage and irregular heartbeat and possibility to perform a PICC exchange if needed/ordered by physician.  Alternatives to this procedure were also discussed.  Bard Power PICC patient education guide, fact sheet on infection prevention and patient information card has been provided to patient /or left at bedside.    PICC/Midline Placement Documentation  PICC Double Lumen 08/12/18 PICC Right Brachial 37 cm 3 cm (Active)  Indication for Insertion or Continuance of Line Poor Vasculature-patient has had multiple peripheral attempts or PIVs lasting less than 24 hours 08/12/2018 11:59 AM  Exposed Catheter (cm) 3 cm 08/12/2018 11:59 AM  Site Assessment Clean;Dry;Intact 08/12/2018 11:59 AM  Lumen #1 Status Flushed;Saline locked;Blood return noted 08/12/2018 11:59 AM  Lumen #2 Status Flushed;Saline locked;Blood return noted 08/12/2018 11:59 AM  Dressing Type Transparent 08/12/2018 11:59 AM  Dressing Status Clean;Dry;Intact;Antimicrobial disc in place 08/12/2018 11:59 AM  Line Care Connections checked and tightened 08/12/2018 11:59 AM  Line Adjustment (NICU/IV Team Only) No 08/12/2018 11:59 AM  Dressing Intervention New dressing 08/12/2018 11:59 AM  Dressing Change Due 08/19/18 08/12/2018 11:59 AM       Rolena Infante 08/12/2018, 12:00 PM

## 2018-08-13 ENCOUNTER — Other Ambulatory Visit: Payer: Self-pay

## 2018-08-13 ENCOUNTER — Inpatient Hospital Stay (HOSPITAL_COMMUNITY): Payer: Medicare Other

## 2018-08-13 LAB — CBC
HCT: 30.3 % — ABNORMAL LOW (ref 36.0–46.0)
Hemoglobin: 10 g/dL — ABNORMAL LOW (ref 12.0–15.0)
MCH: 34.5 pg — ABNORMAL HIGH (ref 26.0–34.0)
MCHC: 33 g/dL (ref 30.0–36.0)
MCV: 104.5 fL — ABNORMAL HIGH (ref 78.0–100.0)
Platelets: 87 10*3/uL — ABNORMAL LOW (ref 150–400)
RBC: 2.9 MIL/uL — ABNORMAL LOW (ref 3.87–5.11)
RDW: 18.8 % — ABNORMAL HIGH (ref 11.5–15.5)
WBC: 7.9 10*3/uL (ref 4.0–10.5)

## 2018-08-13 LAB — GLUCOSE, CAPILLARY
GLUCOSE-CAPILLARY: 103 mg/dL — AB (ref 70–99)
GLUCOSE-CAPILLARY: 101 mg/dL — AB (ref 70–99)
GLUCOSE-CAPILLARY: 94 mg/dL (ref 70–99)
Glucose-Capillary: 110 mg/dL — ABNORMAL HIGH (ref 70–99)
Glucose-Capillary: 87 mg/dL (ref 70–99)

## 2018-08-13 LAB — BASIC METABOLIC PANEL
Anion gap: 12 (ref 5–15)
BUN: 12 mg/dL (ref 8–23)
CO2: 22 mmol/L (ref 22–32)
Calcium: 6.7 mg/dL — ABNORMAL LOW (ref 8.9–10.3)
Chloride: 102 mmol/L (ref 98–111)
Creatinine, Ser: 0.79 mg/dL (ref 0.44–1.00)
GFR calc Af Amer: >60 mL/min (ref >60)
GFR calc non Af Amer: >60 mL/min (ref >60)
Glucose, Bld: 114 mg/dL — ABNORMAL HIGH (ref 70–99)
Potassium: 3.3 mmol/L — ABNORMAL LOW (ref 3.5–5.1)
Sodium: 136 mmol/L (ref 135–145)

## 2018-08-13 LAB — PROTIME-INR
INR: 1.29
Prothrombin Time: 16 seconds — ABNORMAL HIGH (ref 11.4–15.2)

## 2018-08-13 MED ORDER — ONDANSETRON HCL 4 MG/2ML IJ SOLN
4.0000 mg | Freq: Four times a day (QID) | INTRAMUSCULAR | Status: DC | PRN
  Administered 2018-08-14: 4 mg via INTRAVENOUS
  Filled 2018-08-13: qty 2

## 2018-08-13 MED ORDER — FUROSEMIDE 20 MG PO TABS
20.0000 mg | ORAL_TABLET | Freq: Every day | ORAL | Status: DC
  Administered 2018-08-13: 20 mg via ORAL
  Filled 2018-08-13: qty 1

## 2018-08-13 MED ORDER — TRAMADOL HCL 50 MG PO TABS
50.0000 mg | ORAL_TABLET | ORAL | Status: DC | PRN
  Administered 2018-08-13 – 2018-08-17 (×6): 100 mg via ORAL
  Filled 2018-08-13 (×6): qty 2

## 2018-08-13 MED ORDER — WARFARIN VIDEO
Freq: Once | Status: AC
  Administered 2018-08-13: 18:00:00

## 2018-08-13 MED ORDER — MESALAMINE 1.2 G PO TBEC
4.8000 g | DELAYED_RELEASE_TABLET | Freq: Every day | ORAL | Status: DC
  Administered 2018-08-14 – 2018-08-17 (×4): 4.8 g via ORAL
  Filled 2018-08-13 (×4): qty 4

## 2018-08-13 MED ORDER — COUMADIN BOOK
Freq: Once | Status: AC
  Administered 2018-08-13: 18:00:00
  Filled 2018-08-13: qty 1

## 2018-08-13 MED ORDER — ONDANSETRON HCL 4 MG PO TABS: 4.0000 mg | ORAL_TABLET | Freq: Four times a day (QID) | ORAL | Status: DC | PRN

## 2018-08-13 MED ORDER — POTASSIUM CHLORIDE CRYS ER 20 MEQ PO TBCR
40.0000 meq | EXTENDED_RELEASE_TABLET | Freq: Once | ORAL | Status: AC
  Administered 2018-08-13: 40 meq via ORAL
  Filled 2018-08-13: qty 2

## 2018-08-13 MED ORDER — SODIUM CHLORIDE 0.9 % IV SOLN: 250.0000 mL | INTRAVENOUS | Status: DC | PRN

## 2018-08-13 MED ORDER — SODIUM CHLORIDE 0.9% FLUSH: 3.0000 mL | Freq: Two times a day (BID) | INTRAVENOUS | Status: DC

## 2018-08-13 MED ORDER — INSULIN ASPART 100 UNIT/ML ~~LOC~~ SOLN: 0.0000 [IU] | Freq: Three times a day (TID) | SUBCUTANEOUS | Status: DC

## 2018-08-13 MED ORDER — POTASSIUM CHLORIDE CRYS ER 20 MEQ PO TBCR
20.0000 meq | EXTENDED_RELEASE_TABLET | Freq: Every day | ORAL | Status: DC
  Administered 2018-08-13 – 2018-08-17 (×5): 20 meq via ORAL
  Filled 2018-08-13 (×5): qty 1

## 2018-08-13 MED ORDER — ALUM & MAG HYDROXIDE-SIMETH 200-200-20 MG/5ML PO SUSP: 15.0000 mL | ORAL | Status: DC | PRN

## 2018-08-13 MED ORDER — SODIUM CHLORIDE 0.9% FLUSH: 3.0000 mL | INTRAVENOUS | Status: DC | PRN

## 2018-08-13 MED ORDER — MOVING RIGHT ALONG BOOK
Freq: Once | Status: DC
  Filled 2018-08-13: qty 1

## 2018-08-13 MED ORDER — ACETAMINOPHEN 325 MG PO TABS
650.0000 mg | ORAL_TABLET | Freq: Four times a day (QID) | ORAL | Status: DC | PRN
  Administered 2018-08-13 – 2018-08-14 (×3): 650 mg via ORAL
  Filled 2018-08-13 (×2): qty 2

## 2018-08-13 MED ORDER — OXYCODONE HCL 5 MG PO TABS: 5.0000 mg | ORAL_TABLET | ORAL | Status: DC | PRN

## 2018-08-13 NOTE — Progress Notes (Signed)
1st attempt to call report. RN to call back, unavailable.

## 2018-08-13 NOTE — Discharge Instructions (Addendum)
1. Please obtain vital signs at least one time daily 2.Please weigh the patient daily. If he or she continues to gain weight or develops lower extremity edema, contact the office at [phone]. 3. Ambulate patient at least three times daily and please use sternal precautions. 4. Please monitor sternal drainage, this is not currently infected, clean area daily with soap and water, cover with clean/dry dressing   Discharge Instructions:  1. You may shower, please wash incisions daily with soap and water and keep dry.  If you wish to cover wounds with dressing you may do so but please keep clean and change daily.  No tub baths or swimming until incisions have completely healed.  If your incisions become red or develop any drainage please call our office at [phone]  2. No Driving until cleared by Dr. Guy Sandifer office and you are no longer using narcotic pain medications  3. Monitor your weight daily.. Please use the same scale and weigh at same time... If you gain 5-10 lbs in 48 hours with associated lower extremity swelling, please contact our office at [phone]  4. Fever of 101.5 for at least 24 hours with no source, please contact our office at [phone]  5. Activity- up as tolerated, please walk at least 3 times per day.  Avoid strenuous activity, no lifting, pushing, or pulling with your arms over 8-10 lbs for a minimum of 6 weeks  6. If any questions or concerns arise, please do not hesitate to contact our office at [phone]  Information on my medicine - Coumadin   (Warfarin)  This medication education was reviewed with me or my healthcare representative as part of my discharge preparation.  Why was Coumadin prescribed for you? Coumadin was prescribed for you because you have a blood clot or a medical condition that can cause an increased risk of forming blood clots. Blood clots can cause serious health problems by blocking the flow of blood to the heart, lung, or brain.  Coumadin can prevent harmful blood clots from forming. As a reminder your indication for Coumadin is:   Stroke Prevention Because Of Atrial Fibrillation  What test will check on my response to Coumadin? While on Coumadin (warfarin) you will need to have an INR test regularly to ensure that your dose is keeping you in the desired range. The INR (international normalized ratio) number is calculated from the result of the laboratory test called prothrombin time (PT).  If an INR APPOINTMENT HAS NOT ALREADY BEEN MADE FOR YOU please schedule an appointment to have this lab work done by your health care provider within 7 days. Your INR goal is usually a number between:  2 to 3 or your provider may give you a more narrow range like 2-2.5.  Ask your health care provider during an office visit what your goal INR is.  What  do you need to  know  About  COUMADIN? Take Coumadin (warfarin) exactly as prescribed by your healthcare provider about the same time each day.  DO NOT stop taking without talking to the doctor who prescribed the medication.  Stopping without other blood clot prevention medication to take the place of Coumadin may increase your risk of developing a new clot or stroke.  Get refills before you run out.  What do you do if you miss a dose? If you miss a dose, take it as soon as you remember on the same day then continue your regularly scheduled regimen the next day.  Do  not take two doses of Coumadin at the same time.  Important Safety Information A possible side effect of Coumadin (Warfarin) is an increased risk of bleeding. You should call your healthcare provider right away if you experience any of the following: ? Bleeding from an injury or your nose that does not stop. ? Unusual colored urine (red or dark brown) or unusual colored stools (red or black). ? Unusual bruising for unknown reasons. ? A serious fall or if you hit your head (even if there is no bleeding).  Some foods or  medicines interact with Coumadin (warfarin) and might alter your response to warfarin. To help avoid this: ? Eat a balanced diet, maintaining a consistent amount of Vitamin K. ? Notify your provider about major diet changes you plan to make. ? Avoid alcohol or limit your intake to 1 drink for women and 2 drinks for men per day. (1 drink is 5 oz. wine, 12 oz. beer, or 1.5 oz. liquor.)  Make sure that ANY health care provider who prescribes medication for you knows that you are taking Coumadin (warfarin).  Also make sure the healthcare provider who is monitoring your Coumadin knows when you have started a new medication including herbals and non-prescription products.  Coumadin (Warfarin)  Major Drug Interactions  Increased Warfarin Effect Decreased Warfarin Effect  Alcohol (large quantities) Antibiotics (esp. Septra/Bactrim, Flagyl, Cipro) Amiodarone (Cordarone) Aspirin (ASA) Cimetidine (Tagamet) Megestrol (Megace) NSAIDs (ibuprofen, naproxen, etc.) Piroxicam (Feldene) Propafenone (Rythmol SR) Propranolol (Inderal) Isoniazid (INH) Posaconazole (Noxafil) Barbiturates (Phenobarbital) Carbamazepine (Tegretol) Chlordiazepoxide (Librium) Cholestyramine (Questran) Griseofulvin Oral Contraceptives Rifampin Sucralfate (Carafate) Vitamin K   Coumadin (Warfarin) Major Herbal Interactions  Increased Warfarin Effect Decreased Warfarin Effect  Garlic Ginseng Ginkgo biloba Coenzyme Q10 Green tea St. Johns wort    Coumadin (Warfarin) FOOD Interactions  Eat a consistent number of servings per week of foods HIGH in Vitamin K (1 serving =  cup)  Collards (cooked, or boiled & drained) Kale (cooked, or boiled & drained) Mustard greens (cooked, or boiled & drained) Parsley *serving size only =  cup Spinach (cooked, or boiled & drained) Swiss chard (cooked, or boiled & drained) Turnip greens (cooked, or boiled & drained)  Eat a consistent number of servings per week of foods  MEDIUM-HIGH in Vitamin K (1 serving = 1 cup)  Asparagus (cooked, or boiled & drained) Broccoli (cooked, boiled & drained, or raw & chopped) Brussel sprouts (cooked, or boiled & drained) *serving size only =  cup Lettuce, raw (green leaf, endive, romaine) Spinach, raw Turnip greens, raw & chopped   These websites have more information on Coumadin (warfarin):  FailFactory.se; VeganReport.com.au;

## 2018-08-13 NOTE — Progress Notes (Signed)
Report called to Jeneen Rinks. Patient transferring to 4E room 8. No personal belongings, family has all items.

## 2018-08-13 NOTE — Progress Notes (Signed)
Patient ID: Michele Green, female   DOB: April 26, 1941, 77 y.o.   MRN: [id number] [facility] DAILY ICU PROGRESS NOTE                   [address]            [place] [address]          [phone]   3 Days Post-Op Procedure(s) (LRB): MITRAL VALVE REPAIR (MVR) (N/A) CORONARY ARTERY BYPASS GRAFTING (CABG) x 1, LIMA-LAD,  USING LEFT INTERNAL MAMMARY ARTERY. HARVESTED RIGHT GREATER SAPHENOUS VEIN ENDOSCOPICALLY (N/A) MAZE (N/A) TRANSESOPHAGEAL ECHOCARDIOGRAM (TEE) (N/A) CLIPPING OF ATRIAL APPENDAGE  Total Length of Stay:  LOS: 3 days   Subjective: Patient awake alert neurologically intact, complains of some nausea this morning but ate breakfast without any difficulty  Objective: Vital signs in last 24 hours: Temp:  [97.6 F (36.4 C)-99.4 F (37.4 C)] 97.6 F (36.4 C) (09/22 0755) Pulse Rate:  [53-123] 108 (09/22 0700) Cardiac Rhythm: (P) Sinus tachycardia (09/22 0750) Resp:  [11-23] 15 (09/22 0700) BP: (99-162)/(50-76) 99/76 (09/22 0700) SpO2:  [90 %-99 %] 98 % (09/22 0700) Weight:  [63.8 kg] 63.8 kg (09/22 0500)  Filed Weights   08/11/18 0500 08/12/18 0600 08/13/18 0500  Weight: 62.2 kg 62.8 kg 63.8 kg    Weight change: 1 kg   Hemodynamic parameters for last 24 hours:    Intake/Output from previous day: 09/21 0701 - 09/22 0700 In: 218.9 [I.V.:218.9] Out: 1940 [Urine:1940]  Intake/Output this shift: Total I/O In: -  Out: 201 [Urine:200; Stool:1]  Current Meds: Scheduled Meds: . acetaminophen  1,000 mg Oral Q6H  . allopurinol  200 mg Oral Daily  . amiodarone  200 mg Oral BID WC  . aspirin EC  81 mg Oral Daily  . bisacodyl  10 mg Oral Daily   Or  . bisacodyl  10 mg Rectal Daily  . chlorhexidine gluconate (MEDLINE KIT)  15 mL Mouth Rinse BID  . Chlorhexidine Gluconate Cloth  6 each Topical Daily  . docusate sodium  200 mg Oral Daily  . furosemide  20 mg Intravenous BID  . insulin aspart  0-24 Units Subcutaneous Q4H  . metoprolol tartrate  12.5  mg Oral BID  . pantoprazole  40 mg Oral Daily  . potassium chloride  20 mEq Oral BID  . simvastatin  20 mg Oral Daily  . sodium chloride flush  10-40 mL Intracatheter Q12H  . sodium chloride flush  10-40 mL Intracatheter Q12H  . sodium chloride flush  3 mL Intravenous Q12H  . warfarin  2.5 mg Oral q1800  . Warfarin - Physician Dosing Inpatient   Does not apply q1800   Continuous Infusions: . sodium chloride    . lactated ringers Stopped (08/13/18 0454)  . lactated ringers     PRN Meds:.metoprolol tartrate, morphine injection, ondansetron (ZOFRAN) IV, oxyCODONE, sodium chloride flush, sodium chloride flush, sodium chloride flush, traMADol  General appearance: alert, cooperative and no distress Neurologic: intact Heart: regular rate and rhythm, S1, S2 normal, no murmur, click, rub or gallop Lungs: diminished breath sounds bibasilar Abdomen: soft, non-tender; bowel sounds normal; no masses,  no organomegaly Extremities: extremities normal, atraumatic, no cyanosis or edema and Homans sign is negative, no sign of DVT Wound: Sternum stable  Lab Results: CBC: Recent Labs    08/12/18 0348 08/13/18 0412  WBC 9.2 7.9  HGB 7.7* 10.0*  HCT 24.2* 30.3*  PLT 80* 87*   BMET:  Recent Labs  08/12/18 0530 08/13/18 0412  NA 138 136  K 4.0 3.3*  CL 106 102  CO2 24 22  GLUCOSE 108* 114*  BUN 13 12  CREATININE 0.86 0.79  CALCIUM 6.8* 6.7*    CMET: Lab Results  Component Value Date   WBC 7.9 08/13/2018   HGB 10.0 (L) 08/13/2018   HCT 30.3 (L) 08/13/2018   PLT 87 (L) 08/13/2018   GLUCOSE 114 (H) 08/13/2018   ALT 14 08/07/2018   AST 37 08/07/2018   NA 136 08/13/2018   K 3.3 (L) 08/13/2018   CL 102 08/13/2018   CREATININE 0.79 08/13/2018   BUN 12 08/13/2018   CO2 22 08/13/2018   TSH 1.200 05/30/2018   INR 1.29 08/13/2018   HGBA1C 4.6 (L) 08/07/2018      PT/INR:  Recent Labs    08/13/18 0412  LABPROT 16.0*  INR 1.29   Radiology: Dg Chest Port 1 View  Result  Date: 08/13/2018 CLINICAL DATA:  Chest tube EXAM: PORTABLE CHEST 1 VIEW COMPARISON:  08/12/2017 FINDINGS: Mild patchy left lower lobe opacity, likely combination of atelectasis and small left pleural effusion. Trace right pleural effusion. No frank interstitial edema. No pneumothorax.  No chest tube is visualized. Cardiomegaly.  Prosthetic mitral valve. Right arm PICC terminates cavoatrial junction. Interval removal of right IJ venous sheath. Median sternotomy. IMPRESSION: Mild patchy left lower lobe opacity, likely a combination of atelectasis and small left pleural effusion. Trace right pleural effusion. No pneumothorax.  No chest tube is visualized. Electronically Signed   By: Julian Hy M.D.   On: 08/13/2018 07:40   Dg Chest Port 1 View  Result Date: 08/12/2018 CLINICAL DATA:  PICC line placement. EXAM: PORTABLE CHEST 1 VIEW COMPARISON:  08/12/2018 and prior radiographs FINDINGS: A RIGHT PICC line is in place with tip at the SUPERIOR cavoatrial junction. A RIGHT IJ central venous catheter sheath is noted. CABG/mitral valve replacement and LEFT atrial clip again noted. LEFT mid and lower lung opacities/atelectasis again noted. There has been interval removal of mediastinal and thoracostomy tubes. There is no evidence of pneumothorax. IMPRESSION: RIGHT PICC line with tip overlying the SUPERIOR cavoatrial junction. Mediastinal and LEFT thoracostomy tube removal. No pneumothorax. Continued LEFT lung opacity/atelectasis. Electronically Signed   By: Margarette Canada M.D.   On: 08/12/2018 12:25     Assessment/Plan: S/P Procedure(s) (LRB): MITRAL VALVE REPAIR (MVR) (N/A) CORONARY ARTERY BYPASS GRAFTING (CABG) x 1, LIMA-LAD,  USING LEFT INTERNAL MAMMARY ARTERY. HARVESTED RIGHT GREATER SAPHENOUS VEIN ENDOSCOPICALLY (N/A) MAZE (N/A) TRANSESOPHAGEAL ECHOCARDIOGRAM (TEE) (N/A) CLIPPING OF ATRIAL APPENDAGE Mobilize Diuresis Patient on Coumadin 2.5 mg a day, INR 1.29 today Platelet count is increased  slightly 87,000 Expected blood loss anemia-hemoglobin up to 10 today Mild nausea this morning, some loose stools, stool softeners to be inhaled-patient does have a history of chronic colitis Transfer to stepdown PICC line in place  Grace Isaac 08/13/2018 8:04 AM

## 2018-08-14 ENCOUNTER — Inpatient Hospital Stay (HOSPITAL_COMMUNITY): Payer: Medicare Other

## 2018-08-14 DIAGNOSIS — I484 Atypical atrial flutter: Secondary | ICD-10-CM

## 2018-08-14 DIAGNOSIS — I4892 Unspecified atrial flutter: Secondary | ICD-10-CM | POA: Diagnosis not present

## 2018-08-14 HISTORY — DX: Atypical atrial flutter: I48.4

## 2018-08-14 LAB — TYPE AND SCREEN
ABO/RH(D): O POS
Antibody Screen: NEGATIVE
UNIT DIVISION: 0
UNIT DIVISION: 0
UNIT DIVISION: 0
Unit division: 0
Unit division: 0
Unit division: 0

## 2018-08-14 LAB — PROTIME-INR
INR: 1.93
Prothrombin Time: 21.9 seconds — ABNORMAL HIGH (ref 11.4–15.2)

## 2018-08-14 LAB — BPAM RBC
BLOOD PRODUCT EXPIRATION DATE: 201910092359
BLOOD PRODUCT EXPIRATION DATE: 201910092359
BLOOD PRODUCT EXPIRATION DATE: 201910152359
Blood Product Expiration Date: 201910142359
Blood Product Expiration Date: 201910152359
Blood Product Expiration Date: 201910152359
ISSUE DATE / TIME: 201909190839
ISSUE DATE / TIME: 201909190839
ISSUE DATE / TIME: 201909191003
ISSUE DATE / TIME: 201909191003
UNIT TYPE AND RH: 5100
UNIT TYPE AND RH: 5100
Unit Type and Rh: 5100
Unit Type and Rh: 5100
Unit Type and Rh: 5100
Unit Type and Rh: 5100

## 2018-08-14 LAB — BASIC METABOLIC PANEL
Anion gap: 11 (ref 5–15)
BUN: 12 mg/dL (ref 8–23)
CO2: 23 mmol/L (ref 22–32)
Calcium: 6.7 mg/dL — ABNORMAL LOW (ref 8.9–10.3)
Chloride: 103 mmol/L (ref 98–111)
Creatinine, Ser: 0.83 mg/dL (ref 0.44–1.00)
GFR calc Af Amer: >60 mL/min (ref >60)
GFR calc non Af Amer: >60 mL/min (ref >60)
Glucose, Bld: 89 mg/dL (ref 70–99)
Potassium: 3.9 mmol/L (ref 3.5–5.1)
Sodium: 137 mmol/L (ref 135–145)

## 2018-08-14 LAB — CBC
HCT: 28.2 % — ABNORMAL LOW (ref 36.0–46.0)
Hemoglobin: 9.1 g/dL — ABNORMAL LOW (ref 12.0–15.0)
MCH: 34 pg (ref 26.0–34.0)
MCHC: 32.3 g/dL (ref 30.0–36.0)
MCV: 105.2 fL — ABNORMAL HIGH (ref 78.0–100.0)
Platelets: 87 10*3/uL — ABNORMAL LOW (ref 150–400)
RBC: 2.68 MIL/uL — ABNORMAL LOW (ref 3.87–5.11)
RDW: 18.3 % — ABNORMAL HIGH (ref 11.5–15.5)
WBC: 7.1 10*3/uL (ref 4.0–10.5)

## 2018-08-14 LAB — GLUCOSE, CAPILLARY
GLUCOSE-CAPILLARY: 117 mg/dL — AB (ref 70–99)
Glucose-Capillary: 123 mg/dL — ABNORMAL HIGH (ref 70–99)
Glucose-Capillary: 88 mg/dL (ref 70–99)

## 2018-08-14 MED ORDER — METOPROLOL TARTRATE 25 MG PO TABS
25.0000 mg | ORAL_TABLET | Freq: Two times a day (BID) | ORAL | Status: DC
  Administered 2018-08-14 (×2): 25 mg via ORAL
  Filled 2018-08-14 (×2): qty 1

## 2018-08-14 MED ORDER — FUROSEMIDE 10 MG/ML IJ SOLN
40.0000 mg | Freq: Once | INTRAMUSCULAR | Status: AC
  Administered 2018-08-14: 40 mg via INTRAVENOUS
  Filled 2018-08-14: qty 4

## 2018-08-14 MED ORDER — WARFARIN SODIUM 1 MG PO TABS
1.0000 mg | ORAL_TABLET | Freq: Every day | ORAL | Status: DC
  Administered 2018-08-14 – 2018-08-16 (×3): 1 mg via ORAL
  Filled 2018-08-14 (×3): qty 1

## 2018-08-14 MED ORDER — DIGOXIN 125 MCG PO TABS
0.0625 mg | ORAL_TABLET | Freq: Every day | ORAL | Status: DC
  Administered 2018-08-14 – 2018-08-17 (×4): 0.0625 mg via ORAL
  Filled 2018-08-14 (×4): qty 1

## 2018-08-14 MED ORDER — POTASSIUM CHLORIDE CRYS ER 20 MEQ PO TBCR
40.0000 meq | EXTENDED_RELEASE_TABLET | Freq: Once | ORAL | Status: AC
  Administered 2018-08-14: 40 meq via ORAL
  Filled 2018-08-14: qty 2

## 2018-08-14 MED ORDER — FUROSEMIDE 40 MG PO TABS: 40.0000 mg | ORAL_TABLET | Freq: Every day | ORAL | Status: DC

## 2018-08-14 NOTE — Progress Notes (Signed)
      [address]       ,[place] [address]             [phone]     CARDIOTHORACIC SURGERY PROGRESS NOTE  4 Days Post-Op  S/P Procedure(s) (LRB): MITRAL VALVE REPAIR (MVR) (N/A) CORONARY ARTERY BYPASS GRAFTING (CABG) x 1, LIMA-LAD,  USING LEFT INTERNAL MAMMARY ARTERY. HARVESTED RIGHT GREATER SAPHENOUS VEIN ENDOSCOPICALLY (N/A) MAZE (N/A) TRANSESOPHAGEAL ECHOCARDIOGRAM (TEE) (N/A) CLIPPING OF ATRIAL APPENDAGE  Subjective: Feels pretty well.  Minimal pain - taking only intermittent Tylenol.  No SOB.  Marginal appetite.    Objective: Vital signs in last 24 hours: Temp:  [97.9 F (36.6 C)-98.5 F (36.9 C)] 98.2 F (36.8 C) (09/23 0443) Pulse Rate:  [94-118] 115 (09/23 0443) Cardiac Rhythm: Atrial fibrillation (09/23 0700) Resp:  [15-25] 25 (09/23 0443) BP: (108-145)/(59-80) 140/63 (09/23 0443) SpO2:  [90 %-99 %] 90 % (09/23 0443) Weight:  [62.6 kg] 62.6 kg (09/23 0258)  Physical Exam:  Rhythm:   Afib w/ HR 90-100  Breath sounds: clear  Heart sounds:  RRR w/out murmur  Incisions:  Clean and dry  Abdomen:  Soft, non-distended, non-tender  Extremities:  Warm, well-perfused, mild edema     Intake/Output from previous day: 09/22 0701 - 09/23 0700 In: 840 [P.O.:840] Out: 401 [Urine:400; Stool:1] Intake/Output this shift: No intake/output data recorded.  Lab Results: Recent Labs    08/13/18 0412 08/14/18 0235  WBC 7.9 7.1  HGB 10.0* 9.1*  HCT 30.3* 28.2*  PLT 87* 87*   BMET:  Recent Labs    08/13/18 0412 08/14/18 0235  NA 136 137  K 3.3* 3.9  CL 102 103  CO2 22 23  GLUCOSE 114* 89  BUN 12 12  CREATININE 0.79 0.83  CALCIUM 6.7* 6.7*    CBG (last 3)  Recent Labs    08/13/18 1650 08/13/18 2108 08/14/18 0603  GLUCAP 87 94 88   PT/INR:   Recent Labs    08/14/18 0235  LABPROT 21.9*  INR 1.93    CXR:  CHEST - 2 VIEW  COMPARISON:  08/13/2018  FINDINGS: There is a right arm PICC line with tip at the cavoatrial  junction. Status post median sternotomy. Prosthetic mitral valve. Bilateral pleural effusions and bibasilar atelectasis is again noted. Mild pulmonary edema.  IMPRESSION: 1. Mild CHF. 2. Bibasilar atelectasis.   Electronically Signed   By: Kerby Moors M.D.   On: 08/14/2018 07:53  Assessment/Plan: S/P Procedure(s) (LRB): MITRAL VALVE REPAIR (MVR) (N/A) CORONARY ARTERY BYPASS GRAFTING (CABG) x 1, LIMA-LAD,  USING LEFT INTERNAL MAMMARY ARTERY. HARVESTED RIGHT GREATER SAPHENOUS VEIN ENDOSCOPICALLY (N/A) MAZE (N/A) TRANSESOPHAGEAL ECHOCARDIOGRAM (TEE) (N/A) CLIPPING OF ATRIAL APPENDAGE  Overall making good progress Will increase metoprolol and restart home dose digoxin for improved HR control, continue amiodarone Decrease Coumadin dose Mobilize Diuresis Initiate process for possible d/c to local SNF for short term rehab  Rexene Alberts, MD 08/14/2018 8:26 AM

## 2018-08-14 NOTE — Clinical Social Work Note (Signed)
Clinical Social Work Assessment  Patient Details  Name: Michele Green MRN: [id number] Date of Birth: [birth year]  Date of referral:  08/14/18               Reason for consult:  Discharge Planning, Facility Placement                Permission sought to share information with:  Family Supports Permission granted to share information::  Yes, Verbal Permission Granted  Name::     Michele Green  Agency::     Relationship::  sister  Contact Information:  [phone]  Housing/Transportation Living arrangements for the past 2 months:  Single Family Home Source of Information:  Patient Patient Interpreter Needed:  None Criminal Activity/Legal Involvement Pertinent to Current Situation/Hospitalization:    Significant Relationships:  Adult Children, Siblings Lives with:  Self Do you feel safe going back to the place where you live?  Yes Need for family participation in patient care:  Yes (Comment)  Care giving concerns:  No family at bedside. Patient stated she lives alone and her adult son lives 2 hours away from her. Patient stated she has support from her sister who lives in the area.   Social Worker assessment / plan:  CSW met patient at bedside to discuss discharge plan and offer support. Patient stated she is agreeable with discharge to facility and would prefer [facility]. CSW went over the process of SNF and stated that authorization through insurance will have to be approved prior to discharge.   CSW reached out to [facility] and spoke with admission coordinator Narda Rutherford and she stated she will be able to take patient and she will start authorization. Narda Rutherford stated she will reach out to CSW once Josem Kaufmann has been obtained.    Employment status:  Retired Nurse, adult PT Recommendations:  [place] / Referral to community resources:  [place]  Patient/Family's Response to care:  Patient stated she appreciates CSW  role in care  Patient/Family's Understanding of and Emotional Response to Diagnosis, Current Treatment, and Prognosis:  Patient will discharge to [facility] once authorization has been obtained  Emotional Assessment Appearance:  Appears stated age Attitude/Demeanor/Rapport:  Engaged Affect (typically observed):  Accepting Orientation:  Oriented to Self, Oriented to Place, Oriented to  Time, Oriented to Situation Alcohol / Substance use:  Not Applicable Psych involvement (Current and /or in the community):  No (Comment)  Discharge Needs  Concerns to be addressed:  Care Coordination Readmission within the last 30 days:  No Current discharge risk:  Dependent with Mobility Barriers to Discharge:  Continued Medical Work up, [place], LCSW 08/14/2018, 3:55 PM

## 2018-08-14 NOTE — Progress Notes (Signed)
Physical Therapy Treatment Patient Details Name: TAIWAN MILLON MRN: [id number] DOB: 10-Nov-1941 Today's Date: 08/14/2018    History of Present Illness 77 y.o. female s/p CABGx1, MVR, Maze 08/10/18. PMH includes: HTN, breast CA, back surgery.     PT Comments    Pt making steady progress. Pt continues to need cues for sternal precautions and for safety. Pt lives alone and for ST-SNF prior to dc.   Follow Up Recommendations  SNF     Equipment Recommendations  None recommended by PT    Recommendations for Other Services       Precautions / Restrictions Precautions Precautions: Sternal;Fall    Mobility  Bed Mobility Overal bed mobility: Needs Assistance Bed Mobility: Supine to Sit;Sit to Supine     Supine to sit: Supervision Sit to supine: Supervision      Transfers Overall transfer level: Needs assistance Equipment used: None Transfers: Sit to/from Stand Sit to Stand: Min guard         General transfer comment: verbal cues for sternal precautions  Ambulation/Gait Ambulation/Gait assistance: Min guard Gait Distance (Feet): 250 Feet Assistive device: None Gait Pattern/deviations: Step-through pattern;Decreased stride length;Drifts right/left Gait velocity: decreased Gait velocity interpretation: 1.31 - 2.62 ft/sec, indicative of limited community ambulator General Gait Details: Assist for balance. Pt amb on RA with SpO2 92% and dyspnea 2/4.    Stairs             Wheelchair Mobility    Modified Rankin (Stroke Patients Only)       Balance Overall balance assessment: Mild deficits observed, not formally tested                                          Cognition Arousal/Alertness: Awake/alert Behavior During Therapy: Impulsive Overall Cognitive Status: Within Functional Limits for tasks assessed                                        Exercises      General Comments        Pertinent Vitals/Pain Pain  Assessment: No/denies pain    Home Living                      Prior Function            PT Goals (current goals can now be found in the care plan section) Progress towards PT goals: Progressing toward goals;Goals met and updated - see care plan    Frequency    Min 2X/week      PT Plan Current plan remains appropriate;Frequency needs to be updated    Co-evaluation              AM-PAC PT "6 Clicks" Daily Activity  Outcome Measure  Difficulty turning over in bed (including adjusting bedclothes, sheets and blankets)?: A Little Difficulty moving from lying on back to sitting on the side of the bed? : A Little Difficulty sitting down on and standing up from a chair with arms (e.g., wheelchair, bedside commode, etc,.)?: Unable Help needed moving to and from a bed to chair (including a wheelchair)?: A Little Help needed walking in hospital room?: A Little Help needed climbing 3-5 steps with a railing? : A Little 6 Click Score: 16    End of Session  Activity Tolerance: Patient tolerated treatment well Patient left: in bed;with call bell/phone within reach Nurse Communication: Mobility status PT Visit Diagnosis: Unsteadiness on feet (R26.81)     Time: 1898-4210 PT Time Calculation (min) (ACUTE ONLY): 10 min  Charges:  $Gait Training: 8-22 mins                     [place] Pager [phone] Office [place] 08/14/2018, 10:26 AM

## 2018-08-14 NOTE — [place] FL2 (Signed)
[place] MEDICAID FL2 LEVEL OF CARE SCREENING TOOL     IDENTIFICATION  Patient Name: Michele Green Birthdate: September 09, 1941 Sex: female Admission Date (Current Location): 08/10/2018  [facility] and [place] Number:  Herbalist and Address:  [facility], [place] [address] [place], [place] [address]      Provider Number: [id number]  Attending Physician Name and Address:  Rexene Alberts, MD  Relative Name and Phone Number:  Seward Meth, sister, [phone]    Current Level of Care: Hospital Recommended Level of Care: [place] Prior Approval Number:    Date Approved/Denied:   PASRR Number: [id number]  Discharge Plan: SNF    Current Diagnoses: Patient Active Problem List   Diagnosis Date Noted  . 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 S/P mitral valve repair + CABG x1 + maze procedure 08/10/2018  . S/P Maze operation for atrial fibrillation 08/10/2018  . Coronary artery disease involving native coronary artery of native heart without angina pectoris   . Chronic diastolic HF (heart failure) ([place]) 06/20/2018  . Osteoporosis 09/29/2017  . Loosening of hardware in spine ([place]) 08/23/2017  . Genetic testing 03/17/2016  . Family history of breast cancer   . Breast cancer of upper-outer quadrant of left female breast ([place]) 02/02/2016  . On amiodarone therapy 09/09/2015  . On continuous oral anticoagulation 08/22/2015  . Persistent atrial fibrillation ([place]) 08/22/2015  . Essential hypertension 11/07/2013  . Hyperlipidemia 11/07/2013  . Severe mitral regurgitation     Orientation RESPIRATION BLADDER Height & Weight     Self, Time, Situation, Place  Normal Continent Weight: 138 lb (62.6 kg) Height:  5' 4"  (162.6 cm)  BEHAVIORAL SYMPTOMS/MOOD NEUROLOGICAL BOWEL NUTRITION STATUS      Continent Diet(please see DC summary)  AMBULATORY STATUS COMMUNICATION OF NEEDS Skin   Limited Assist Verbally Surgical wounds(closed incision chest, R leg  closed incision)                       Personal Care Assistance Level of Assistance  Bathing, Feeding, Dressing Bathing Assistance: Limited assistance Feeding assistance: Independent Dressing Assistance: Limited assistance     Functional Limitations Info             SPECIAL CARE FACTORS FREQUENCY  PT (By licensed PT)     PT Frequency: 5x/week              Contractures Contractures Info: Not present    Additional Factors Info  Code Status, Allergies Code Status Info: Full Allergies Info: No Known Allergies           Current Medications (08/14/2018):  This is the current hospital active medication list Current Facility-Administered Medications  Medication Dose Route Frequency Provider Last Rate Last Dose  . acetaminophen (TYLENOL) tablet 650 mg  650 mg Oral Q6H PRN Grace Isaac, MD   650 mg at 08/14/18 0440  . alum & mag hydroxide-simeth (MAALOX/MYLANTA) 200-200-20 MG/5ML suspension 15-30 mL  15-30 mL Oral Q4H PRN Grace Isaac, MD      . amiodarone (PACERONE) tablet 200 mg  200 mg Oral BID WC Grace Isaac, MD   200 mg at 08/14/18 0839  . aspirin EC tablet 81 mg  81 mg Oral Daily Grace Isaac, MD   81 mg at 08/14/18 0839  . chlorhexidine gluconate (MEDLINE KIT) (PERIDEX) 0.12 % solution 15 mL  15 mL Mouth Rinse BID Grace Isaac, MD   15  mL at 08/11/18 2005  . Chlorhexidine Gluconate Cloth 2 % PADS 6 each  6 each Topical Daily Grace Isaac, MD      . digoxin (LANOXIN) tablet 0.0625 mg  0.0625 mg Oral Daily Rexene Alberts, MD   0.0625 mg at 08/14/18 4315  . [START ON 08/15/2018] furosemide (LASIX) tablet 40 mg  40 mg Oral Daily Rexene Alberts, MD      . mesalamine (LIALDA) EC tablet 4.8 g  4.8 g Oral Q breakfast Grace Isaac, MD   4.8 g at 08/14/18 4008  . metoprolol tartrate (LOPRESSOR) tablet 25 mg  25 mg Oral BID Rexene Alberts, MD   25 mg at 08/14/18 0841  . moving right along book   Does not apply Once Grace Isaac, MD      . ondansetron [facility]) tablet 4 mg  4 mg Oral Q6H PRN Grace Isaac, MD       Or  . ondansetron [facility]) injection 4 mg  4 mg Intravenous Q6H PRN Grace Isaac, MD   4 mg at 08/14/18 0003  . oxyCODONE (Oxy IR/ROXICODONE) immediate release tablet 5-10 mg  5-10 mg Oral Q3H PRN Grace Isaac, MD      . potassium chloride SA (K-DUR,KLOR-CON) CR tablet 20 mEq  20 mEq Oral Daily Grace Isaac, MD   20 mEq at 08/14/18 0840  . simvastatin (ZOCOR) tablet 20 mg  20 mg Oral Daily Grace Isaac, MD   20 mg at 08/14/18 0840  . sodium chloride flush (NS) 0.9 % injection 10-40 mL  10-40 mL Intracatheter Q12H Grace Isaac, MD   10 mL at 08/14/18 0840  . sodium chloride flush (NS) 0.9 % injection 10-40 mL  10-40 mL Intracatheter PRN Grace Isaac, MD      . traMADol [facility]) tablet 50-100 mg  50-100 mg Oral Q4H PRN Grace Isaac, MD   100 mg at 08/13/18 1114  . warfarin (COUMADIN) tablet 1 mg  1 mg Oral q1800 Rexene Alberts, MD      . Warfarin - Physician Dosing Inpatient   Does not apply q1800 Grace Isaac, MD         Discharge Medications: Please see discharge summary for a list of discharge medications.  Relevant Imaging Results:  Relevant Lab Results:   Additional Information SSN: [id number]  Estanislado Emms, LCSW

## 2018-08-14 NOTE — Care Management Important Message (Signed)
Important Message  Patient Details  Name: Michele Green MRN: [id number] Date of Birth: 24-Sep-1941   Medicare Important Message Given:  Yes    Barb Merino [place] 08/14/2018, 11:49 AM

## 2018-08-14 NOTE — Progress Notes (Signed)
CARDIAC REHAB PHASE I   PRE:  Rate/Rhythm: 96 afib  BP:  Supine:   Sitting: 141/61  Standing:    SaO2: 94%RA  MODE:  Ambulation: 250 ft   POST:  Rate/Rhythm: 107 afib  BP:  Supine:   Sitting: 134/65  Standing:    SaO2: would not register 1235-1256 Pt walked 250 ft on RA with hand held asst . Gait steady. Back to bed after walk. Tolerated well. Could not get sats to register after walk.    Graylon Good, RN BSN  08/14/2018 12:52 PM

## 2018-08-15 LAB — PROTIME-INR
INR: 2.33
Prothrombin Time: 25.3 seconds — ABNORMAL HIGH (ref 11.4–15.2)

## 2018-08-15 MED ORDER — METOPROLOL TARTRATE 50 MG PO TABS
50.0000 mg | ORAL_TABLET | Freq: Two times a day (BID) | ORAL | Status: DC
  Administered 2018-08-15 (×2): 50 mg via ORAL
  Filled 2018-08-15 (×2): qty 1

## 2018-08-15 MED ORDER — AMIODARONE HCL 200 MG PO TABS
400.0000 mg | ORAL_TABLET | Freq: Two times a day (BID) | ORAL | Status: DC
  Administered 2018-08-15 – 2018-08-17 (×4): 400 mg via ORAL
  Filled 2018-08-15 (×4): qty 2

## 2018-08-15 MED ORDER — AMIODARONE IV BOLUS ONLY 150 MG/100ML
150.0000 mg | Freq: Once | INTRAVENOUS | Status: AC
  Administered 2018-08-15: 150 mg via INTRAVENOUS
  Filled 2018-08-15: qty 100

## 2018-08-15 MED ORDER — FUROSEMIDE 10 MG/ML IJ SOLN
40.0000 mg | Freq: Once | INTRAMUSCULAR | Status: AC
  Administered 2018-08-15: 40 mg via INTRAVENOUS
  Filled 2018-08-15: qty 4

## 2018-08-15 MED ORDER — FUROSEMIDE 40 MG PO TABS: 40.0000 mg | ORAL_TABLET | Freq: Every day | ORAL | Status: DC

## 2018-08-15 MED FILL — Magnesium Sulfate Inj 50%: INTRAMUSCULAR | Qty: 10 | Status: AC

## 2018-08-15 MED FILL — Heparin Sodium (Porcine) Inj 1000 Unit/ML: INTRAMUSCULAR | Qty: 30 | Status: AC

## 2018-08-15 MED FILL — Heparin Sodium (Porcine) Inj 1000 Unit/ML: INTRAMUSCULAR | Qty: 10 | Status: AC

## 2018-08-15 MED FILL — Electrolyte-R (PH 7.4) Solution: INTRAVENOUS | Qty: 5000 | Status: AC

## 2018-08-15 MED FILL — Sodium Chloride IV Soln 0.9%: INTRAVENOUS | Qty: 2000 | Status: AC

## 2018-08-15 MED FILL — Potassium Chloride Inj 2 mEq/ML: INTRAVENOUS | Qty: 40 | Status: AC

## 2018-08-15 MED FILL — Sodium Bicarbonate IV Soln 8.4%: INTRAVENOUS | Qty: 50 | Status: AC

## 2018-08-15 MED FILL — Lidocaine HCl(Cardiac) IV PF Soln Pref Syr 100 MG/5ML (2%): INTRAVENOUS | Qty: 25 | Status: AC

## 2018-08-15 MED FILL — Calcium Chloride Inj 10%: INTRAVENOUS | Qty: 10 | Status: AC

## 2018-08-15 MED FILL — Mannitol IV Soln 20%: INTRAVENOUS | Qty: 1000 | Status: AC

## 2018-08-15 NOTE — Progress Notes (Signed)
Since about 0605 pt has been from HR 118-to 130's at rest  Has been 90-110's will pass on in report

## 2018-08-15 NOTE — Progress Notes (Addendum)
      [address]       [place] [address]             [phone]      5 Days Post-Op Procedure(s) (LRB): MITRAL VALVE REPAIR (MVR) (N/A) CORONARY ARTERY BYPASS GRAFTING (CABG) x 1, LIMA-LAD,  USING LEFT INTERNAL MAMMARY ARTERY. HARVESTED RIGHT GREATER SAPHENOUS VEIN ENDOSCOPICALLY (N/A) MAZE (N/A) TRANSESOPHAGEAL ECHOCARDIOGRAM (TEE) (N/A) CLIPPING OF ATRIAL APPENDAGE   Subjective:  Patient complains of shortness of breath this morning.  She states she gets winded just going to the bathroom.  Objective: Vital signs in last 24 hours: Temp:  [98.7 F (37.1 C)-99.6 F (37.6 C)] 98.8 F (37.1 C) (09/24 0445) Pulse Rate:  [90-121] 90 (09/24 0445) Cardiac Rhythm: Atrial fibrillation (09/24 0445) Resp:  [19-29] 29 (09/24 0445) BP: (127-159)/(59-86) 149/72 (09/24 0445) SpO2:  [90 %-96 %] 90 % (09/24 0445) Weight:  [60.7 kg] 60.7 kg (09/24 0445)  Intake/Output from previous day: 09/23 0701 - 09/24 0700 In: 120 [P.O.:120] Out: 400 [Urine:400]  General appearance: alert, cooperative and no distress Heart: irregularly irregular rhythm Lungs: diminished breath sounds bibasilar Abdomen: soft, non-tender; bowel sounds normal; no masses,  no organomegaly Extremities: edema 1+ pitting Wound: clean and dry  Lab Results: Recent Labs    08/13/18 0412 08/14/18 0235  WBC 7.9 7.1  HGB 10.0* 9.1*  HCT 30.3* 28.2*  PLT 87* 87*   BMET:  Recent Labs    08/13/18 0412 08/14/18 0235  NA 136 137  K 3.3* 3.9  CL 102 103  CO2 22 23  GLUCOSE 114* 89  BUN 12 12  CREATININE 0.79 0.83  CALCIUM 6.7* 6.7*    PT/INR:  Recent Labs    08/15/18 0451  LABPROT 25.3*  INR 2.33   ABG    Component Value Date/Time   PHART 7.314 (L) 08/10/2018 1954   HCO3 21.8 08/10/2018 1954   TCO2 24 08/11/2018 1751   ACIDBASEDEF 4.0 (H) 08/10/2018 1954   O2SAT 98.0 08/10/2018 1954   CBG (last 3)  Recent Labs    08/13/18 1650 08/13/18 2108 08/14/18 0603  GLUCAP 87 94 88      Assessment/Plan: S/P Procedure(s) (LRB): MITRAL VALVE REPAIR (MVR) (N/A) CORONARY ARTERY BYPASS GRAFTING (CABG) x 1, LIMA-LAD,  USING LEFT INTERNAL MAMMARY ARTERY. HARVESTED RIGHT GREATER SAPHENOUS VEIN ENDOSCOPICALLY (N/A) MAZE (N/A) TRANSESOPHAGEAL ECHOCARDIOGRAM (TEE) (N/A) CLIPPING OF ATRIAL APPENDAGE  1. CV- Atrial Fibrillation, rate into the 130s today, patient remains hypertensive as well, will increase Lopressor to 50 mg daily, continue Amiodarone 200 mg BID, Digoxin 2. INR 2.33, will give coumadin 1 mg this evening 3. Pulm- bilateral pleural effusions, atelectasis- off oxygen, continue IS 4. Renal- creatinine WNL, weight is trending down, continue Lasix, potassium 5. Dispo- patient stable, in Atrial Fibrillation will increase Lopressor, continue Amiodarone, Digoxin, INR is therapeutic at 2.33, continue reduced dose of 1 mg daily, SNF is being arranged, continue current care   LOS: 5 days    Ellwood Handler 08/15/2018  I have seen and examined the patient and agree with the assessment and plan as outlined.  HR up further.  Increase metoprolol 50 bid and increase amiodarone 400 bid.  Will give extra bolus amiodarone IV.  Possibly ready for d/c to SNF 1-2 days if HR under better control and dyspnea improved  Rexene Alberts, MD 08/15/2018 1:14 PM

## 2018-08-15 NOTE — Progress Notes (Signed)
CARDIAC REHAB PHASE I   PRE:  Rate/Rhythm: 131 Afib   Offered to walk with pt, pt experiencing SOB and palpitations. Education completed with pt. Pt instructed to shower daily and monitor incisions. Pts incisions has some redness currently, RN made aware. Reinforced sternal precautions. In-the-tube sheet given, along with O[facility] care guide, and heart healthy diet. Reviewed restrictions and exercise guidelines. Will continue to follow.  [phone] Rufina Falco, RN BSN 08/15/2018 10:46 AM

## 2018-08-15 NOTE — Progress Notes (Signed)
CSW following patient for support and discharge needs. Patient has bed at [facility] and authorization through insurance has been obtained. CSW will continue to follow patient until patient is medically cleared for discharge.  Rhea Pink, MSW,  [place]

## 2018-08-16 LAB — BASIC METABOLIC PANEL
ANION GAP: 8 (ref 5–15)
BUN: 10 mg/dL (ref 8–23)
CALCIUM: 7.1 mg/dL — AB (ref 8.9–10.3)
CO2: 27 mmol/L (ref 22–32)
CREATININE: 0.68 mg/dL (ref 0.44–1.00)
Chloride: 102 mmol/L (ref 98–111)
Glucose, Bld: 105 mg/dL — ABNORMAL HIGH (ref 70–99)
Potassium: 3.5 mmol/L (ref 3.5–5.1)
SODIUM: 137 mmol/L (ref 135–145)

## 2018-08-16 LAB — MAGNESIUM: MAGNESIUM: 1.9 mg/dL (ref 1.7–2.4)

## 2018-08-16 LAB — PROTIME-INR
INR: 2.43
Prothrombin Time: 26.2 seconds — ABNORMAL HIGH (ref 11.4–15.2)

## 2018-08-16 MED ORDER — POTASSIUM CHLORIDE 10 MEQ/50ML IV SOLN
10.0000 meq | INTRAVENOUS | Status: AC
  Administered 2018-08-16 (×3): 10 meq via INTRAVENOUS
  Filled 2018-08-16 (×3): qty 50

## 2018-08-16 MED ORDER — POTASSIUM CHLORIDE CRYS ER 20 MEQ PO TBCR
40.0000 meq | EXTENDED_RELEASE_TABLET | Freq: Once | ORAL | Status: AC
  Administered 2018-08-16: 40 meq via ORAL
  Filled 2018-08-16: qty 2

## 2018-08-16 MED ORDER — FUROSEMIDE 40 MG PO TABS: 40.0000 mg | ORAL_TABLET | Freq: Every day | ORAL | Status: DC

## 2018-08-16 MED ORDER — METOPROLOL TARTRATE 100 MG PO TABS
100.0000 mg | ORAL_TABLET | Freq: Two times a day (BID) | ORAL | Status: DC
  Administered 2018-08-16 – 2018-08-17 (×3): 100 mg via ORAL
  Filled 2018-08-16 (×3): qty 1

## 2018-08-16 NOTE — Progress Notes (Addendum)
[address]       [place] [address]             [phone]      6 Days Post-Op Procedure(s) (LRB): MITRAL VALVE REPAIR (MVR) (N/A) CORONARY ARTERY BYPASS GRAFTING (CABG) x 1, LIMA-LAD,  USING LEFT INTERNAL MAMMARY ARTERY. HARVESTED RIGHT GREATER SAPHENOUS VEIN ENDOSCOPICALLY (N/A) MAZE (N/A) TRANSESOPHAGEAL ECHOCARDIOGRAM (TEE) (N/A) CLIPPING OF ATRIAL APPENDAGE   Subjective:  Patient states her heart rate is all over the place.  She states they cant get it under control.  It is especially worse with ambulation.  She also states she is going to the bathroom every 5 minutes.  Objective: Vital signs in last 24 hours: Temp:  [97.8 F (36.6 C)-98.5 F (36.9 C)] 97.9 F (36.6 C) (09/25 0756) Pulse Rate:  [94-138] 130 (09/25 0756) Cardiac Rhythm: Atrial fibrillation (09/25 0417) Resp:  [15-29] 22 (09/25 0756) BP: (106-170)/(57-97) 170/79 (09/25 0756) SpO2:  [90 %-98 %] 95 % (09/25 0756) Weight:  [59.4 kg] 59.4 kg (09/25 0417)  Intake/Output from previous day: 09/24 0701 - 09/25 0700 In: 1061.2 [P.O.:1060; I.V.:1.2] Out: 1901 [Urine:1900; Stool:1]  General appearance: alert, cooperative and no distress Heart: irregularly irregular rhythm Lungs: clear to auscultation bilaterally Abdomen: soft, non-tender; bowel sounds normal; no masses,  no organomegaly Extremities: edema trace Wound: clean and dry  Lab Results: Recent Labs    08/14/18 0235  WBC 7.1  HGB 9.1*  HCT 28.2*  PLT 87*   BMET:  Recent Labs    08/14/18 0235 08/16/18 0439  NA 137 137  K 3.9 3.5  CL 103 102  CO2 23 27  GLUCOSE 89 105*  BUN 12 10  CREATININE 0.83 0.68  CALCIUM 6.7* 7.1*    PT/INR:  Recent Labs    08/16/18 0439  LABPROT 26.2*  INR 2.43   ABG    Component Value Date/Time   PHART 7.314 (L) 08/10/2018 1954   HCO3 21.8 08/10/2018 1954   TCO2 24 08/11/2018 1751   ACIDBASEDEF 4.0 (H) 08/10/2018 1954   O2SAT 98.0 08/10/2018 1954   CBG (last 3)  Recent  Labs    08/13/18 1650 08/13/18 2108 08/14/18 0603  GLUCAP 87 94 88    Assessment/Plan: S/P Procedure(s) (LRB): MITRAL VALVE REPAIR (MVR) (N/A) CORONARY ARTERY BYPASS GRAFTING (CABG) x 1, LIMA-LAD,  USING LEFT INTERNAL MAMMARY ARTERY. HARVESTED RIGHT GREATER SAPHENOUS VEIN ENDOSCOPICALLY (N/A) MAZE (N/A) TRANSESOPHAGEAL ECHOCARDIOGRAM (TEE) (N/A) CLIPPING OF ATRIAL APPENDAGE  1. CV- Atrial Flutter, + HTN- continue Amiodarone, Lopressor, Digoxin- will increase Lopressor to 100 mg BID 2. INR 2.44, will continue coumadin at 1 mg daily 3. Pulm- no acute issues, continue IS 4. Renal- creatinine WNL, weight is down 6 lbs, continue Lasix 40 mg daily 5. Deconditioning- will need SNF, it has been arranged, patient is not ready for d/c today 6. Dispo- patient with Atrial Flutter, rate is not controlled, will increase Lopressor to 100 mg BID as discussed with Dr. Roxy Manns, continue coumadin at 1 mg daily, continue lasix, she is not ready for d/c to SNF until better HR control is achieved   LOS: 6 days    Erin Barrett 08/16/2018  I have seen and examined the patient and agree with the assessment and plan as outlined.  Rhythm appears to be atypical atrial flutter w/ HR 130.  Patient symptomatic w/ tachypalpitation and DOE.  Therapeutic on warfarin.  Will increase metoprolol further and continue amiodarone.  Supplement potassium.  Hold  lasix today and restart low dose oral lasix tomorrow.  Consider DCCV if it persists.  Hold plans for D/C to SNF until rhythm improved.    Rexene Alberts, MD 08/16/2018 8:31 AM

## 2018-08-16 NOTE — Progress Notes (Signed)
CARDIAC REHAB PHASE I   PRE:  Rate/Rhythm: 88 Afib  BP:  Sitting: 166/56      SaO2: 96 SR  MODE:  Ambulation: 200 ft 101 peak HR  POST:  Rate/Rhythm: 84 Afib  BP:  Sitting: 156/90    SaO2: 94 RA   Pt ambulated 240f in hallway independently with steady gait. Pt DOE. Denying CP. Pt expected quicker recover, talked with pt that she had major surgery and to give it time. Call bell within reach. Will continue to follow and encourage pt.  [id number] TRufina Falco RN BSN 08/16/2018 1:58 PM

## 2018-08-17 ENCOUNTER — Encounter (HOSPITAL_COMMUNITY): Payer: Self-pay | Admitting: Thoracic Surgery (Cardiothoracic Vascular Surgery)

## 2018-08-17 ENCOUNTER — Other Ambulatory Visit: Payer: Self-pay | Admitting: Medical

## 2018-08-17 ENCOUNTER — Telehealth: Payer: Self-pay | Admitting: Physician Assistant

## 2018-08-17 DIAGNOSIS — I4819 Other persistent atrial fibrillation: Secondary | ICD-10-CM

## 2018-08-17 LAB — PROTIME-INR
INR: 2.45
Prothrombin Time: 26.3 seconds — ABNORMAL HIGH (ref 11.4–15.2)

## 2018-08-17 MED ORDER — ASPIRIN 81 MG PO TBEC: 81.0000 mg | DELAYED_RELEASE_TABLET | Freq: Every day | ORAL | Status: DC

## 2018-08-17 MED ORDER — METOPROLOL TARTRATE 100 MG PO TABS: 100.0000 mg | ORAL_TABLET | Freq: Two times a day (BID) | ORAL | Status: DC

## 2018-08-17 MED ORDER — FUROSEMIDE 40 MG PO TABS: 40.0000 mg | ORAL_TABLET | Freq: Every day | ORAL | 0 refills | Status: DC

## 2018-08-17 MED ORDER — WARFARIN SODIUM 1 MG PO TABS: 1.0000 mg | ORAL_TABLET | Freq: Every day | ORAL | Status: DC

## 2018-08-17 MED ORDER — AMIODARONE HCL 400 MG PO TABS: 400.0000 mg | ORAL_TABLET | Freq: Two times a day (BID) | ORAL | Status: DC

## 2018-08-17 MED ORDER — DIGOXIN 62.5 MCG PO TABS: 0.0625 mg | ORAL_TABLET | Freq: Every day | ORAL | Status: DC

## 2018-08-17 MED ORDER — TRAMADOL HCL 50 MG PO TABS: 50.0000 mg | ORAL_TABLET | ORAL | 0 refills | Status: DC | PRN

## 2018-08-17 MED ORDER — ACETAMINOPHEN 325 MG PO TABS: 650.0000 mg | ORAL_TABLET | Freq: Four times a day (QID) | ORAL | Status: DC | PRN

## 2018-08-17 NOTE — Progress Notes (Signed)
Report called to [facility]. All questions answered.

## 2018-08-17 NOTE — Care Management Note (Signed)
Case Management Note Marvetta Gibbons RN, BSN Unit 4E- RN Care Coordinator  [phone]  Patient Details  Name: Michele Green MRN: [id number] Date of Birth: 03/03/1941  Subjective/Objective:    Pt admitted s/p CABG, MVR, MAZE                Action/Plan: PTA Pt lived at home, recommendation for SNF, CSW consulted for placement needs  Expected Discharge Date:  08/17/18               Expected Discharge Plan:  Skilled Nursing Facility  In-House Referral:  Clinical Social Work  Discharge planning Services  CM Consult  Post Acute Care Choice:  NA Choice offered to:  NA  DME Arranged:    DME Agency:     HH Arranged:    [place] Agency:     Status of Service:  Completed, signed off  If discussed at [employer] of Stay Meetings, dates discussed:    Discharge Disposition: skilled facility   Additional Comments:  08/17/18- 1350- Saron Vanorman RN, CM- pt for transition to SNF today- CSW following for transition of care needs to SNF- [facility]  Dahlia Client, [employer], RN 08/17/2018, 1:48 PM

## 2018-08-17 NOTE — Progress Notes (Signed)
      [address]       [place] [address]             [phone]      7 Days Post-Op Procedure(s) (LRB): MITRAL VALVE REPAIR (MVR) (N/A) CORONARY ARTERY BYPASS GRAFTING (CABG) x 1, LIMA-LAD,  USING LEFT INTERNAL MAMMARY ARTERY. HARVESTED RIGHT GREATER SAPHENOUS VEIN ENDOSCOPICALLY (N/A) MAZE (N/A) TRANSESOPHAGEAL ECHOCARDIOGRAM (TEE) (N/A) CLIPPING OF ATRIAL APPENDAGE   Subjective:  No new complaints.  She is hoping to be discharged to rehab facility today.  Objective: Vital signs in last 24 hours: Temp:  [97.7 F (36.5 C)-98.3 F (36.8 C)] 98.2 F (36.8 C) (09/26 0620) Pulse Rate:  [74-130] 112 (09/26 0620) Cardiac Rhythm: Atrial fibrillation (09/26 0700) Resp:  [20-27] 20 (09/26 0620) BP: (133-177)/(56-89) 148/75 (09/26 0620) SpO2:  [91 %-95 %] 93 % (09/26 0620) Weight:  [59.2 kg] 59.2 kg (09/26 0620)  Intake/Output from previous day: 09/25 0701 - 09/26 0700 In: 100 [IV Piggyback:100] Out: 300 [Urine:300]  General appearance: alert, cooperative and no distress Heart: irregularly irregular rhythm Lungs: diminished breath sounds bibasilar Abdomen: soft, non-tender; bowel sounds normal; no masses,  no organomegaly Extremities: edema trace Wound: sternal minimal erythema, mild drainage present, looks like fat necrosis  Lab Results: No results for input(s): WBC, HGB, HCT, PLT in the last 72 hours. BMET:  Recent Labs    08/16/18 0439  NA 137  K 3.5  CL 102  CO2 27  GLUCOSE 105*  BUN 10  CREATININE 0.68  CALCIUM 7.1*    PT/INR:  Recent Labs    08/17/18 0430  LABPROT 26.3*  INR 2.45   ABG    Component Value Date/Time   PHART 7.314 (L) 08/10/2018 1954   HCO3 21.8 08/10/2018 1954   TCO2 24 08/11/2018 1751   ACIDBASEDEF 4.0 (H) 08/10/2018 1954   O2SAT 98.0 08/10/2018 1954   CBG (last 3)  No results for input(s): GLUCAP in the last 72 hours.  Assessment/Plan: S/P Procedure(s) (LRB): MITRAL VALVE REPAIR (MVR) (N/A) CORONARY  ARTERY BYPASS GRAFTING (CABG) x 1, LIMA-LAD,  USING LEFT INTERNAL MAMMARY ARTERY. HARVESTED RIGHT GREATER SAPHENOUS VEIN ENDOSCOPICALLY (N/A) MAZE (N/A) TRANSESOPHAGEAL ECHOCARDIOGRAM (TEE) (N/A) CLIPPING OF ATRIAL APPENDAGE  1.  CV- rate controlled A. Fib/flutter- continue Amiodarone, lopressor, Digoxin- continue current regimen 2. INR 2.45, continue coumadin at 1 mg daily 3. Pulm- off oxygen, no acute issues, continue IS 4. Renal- creatinine stable, weight is trending down, continue Lasix 5. ID- afebrile, minimal sternal drainage, erythema, appears to be sternal drainage, no ABX at this time.. Will have come to office for wound check next week 6. Dispo- patient stable, HR is controlled with increase dose of Lopressor, continue diuretics, will plan to d/c to SNF today.   LOS: 7 days    [place] Handler 08/17/2018

## 2018-08-17 NOTE — Progress Notes (Signed)
Physical Therapy Treatment Patient Details Name: Michele Green MRN: [id number] DOB: [birth year] Today's Date: 08/17/2018    History of Present Illness 77 y.o. female s/p CABGx1, MVR, Maze 08/10/18. PMH includes: HTN, breast CA, back surgery.     PT Comments    Patient progressing well with therapy, ambulating without assistive device, good velocity, VSS on RA, now stand by assist for gait. Pt plans to leave to SNF today, anticipate she will do well.   Follow Up Recommendations  SNF     Equipment Recommendations  None recommended by PT    Recommendations for Other Services       Precautions / Restrictions Precautions Precautions: Sternal;Fall Restrictions Weight Bearing Restrictions: Yes    Mobility  Bed Mobility Overal bed mobility: Needs Assistance Bed Mobility: Supine to Sit;Sit to Supine     Supine to sit: Supervision Sit to supine: Supervision      Transfers Overall transfer level: Needs assistance Equipment used: None Transfers: Sit to/from Stand Sit to Stand: Supervision            Ambulation/Gait Ambulation/Gait assistance: Supervision Gait Distance (Feet): 250 Feet Assistive device: None Gait Pattern/deviations: Step-through pattern;Decreased stride length;Drifts right/left Gait velocity: decreased   General Gait Details: pt without AD, VSS on RA, DOE 1/4.   Stairs             Wheelchair Mobility    Modified Rankin (Stroke Patients Only)       Balance Overall balance assessment: Mild deficits observed, not formally tested                                          Cognition Arousal/Alertness: Awake/alert Behavior During Therapy: Impulsive Overall Cognitive Status: Within Functional Limits for tasks assessed                                 General Comments: pt anxious and slightly irritiable, per RN has been this way all day. unsure baslinse level of anxiety.       Exercises      General  Comments        Pertinent Vitals/Pain Pain Assessment: No/denies pain    Home Living                      Prior Function            PT Goals (current goals can now be found in the care plan section) Acute Rehab PT Goals Patient Stated Goal: go to SNF PT Goal Formulation: With patient/family Time For Goal Achievement: 08/19/18 Potential to Achieve Goals: Good Progress towards PT goals: Progressing toward goals    Frequency    Min 3X/week      PT Plan Current plan remains appropriate;Frequency needs to be updated    Co-evaluation              AM-PAC PT "6 Clicks" Daily Activity  Outcome Measure  Difficulty turning over in bed (including adjusting bedclothes, sheets and blankets)?: A Little Difficulty moving from lying on back to sitting on the side of the bed? : A Little Difficulty sitting down on and standing up from a chair with arms (e.g., wheelchair, bedside commode, etc,.)?: A Little Help needed moving to and from a bed to chair (including a wheelchair)?: A Little Help needed walking  in hospital room?: A Little Help needed climbing 3-5 steps with a railing? : A Little 6 Click Score: 18    End of Session Equipment Utilized During Treatment: Gait belt Activity Tolerance: Patient tolerated treatment well Patient left: in bed;with call bell/phone within reach Nurse Communication: Mobility status PT Visit Diagnosis: Unsteadiness on feet (R26.81)     Time: 1145-1200 PT Time Calculation (min) (ACUTE ONLY): 15 min  Charges:  $Gait Training: 8-22 mins                     Reinaldo Berber, PT, DPT Acute Rehabilitation Services Pager: [phone] Office: [place] 08/17/2018, 1:46 PM

## 2018-08-17 NOTE — Telephone Encounter (Signed)
New message     Per Daleen Snook TOC Appt with Richardson Dopp on 08/22/18 at 8:45am.

## 2018-08-17 NOTE — Progress Notes (Addendum)
Patient will Discharge To: [facility] Anticipated DC Date:08/17/18 Family Notified:yes, sister [name] By Sister will transport to SNF   Per MD patient ready for DC to [facility] . RN, patient, patient's family, and facility notified of DC. Assessment, Fl2/Pasrr, and Discharge Summary sent to facility. RN given number for report [phone], Room # 3247). DC packet on chart. Ambulance transport requested for patient.   CSW signing off.  Reed Breech LCSWA [phone]

## 2018-08-17 NOTE — Telephone Encounter (Signed)
Patient discharged from hospital today, will make TCM call tomorrow.

## 2018-08-17 NOTE — Progress Notes (Signed)
CARDIAC REHAB PHASE I   Pt continues to "not feel right". HR noted to be 115 while lying in bed. Encouraged pt not be discouraged by recovery course. Drainage noted on sternum, pt educated to ask for change gauze as needed at rehab. Reinforced importance of keeping incisions clean and dry. Encouraged continued IS use and mobility.   [id number] Rufina Falco, RN BSN 08/17/2018 9:54 AM

## 2018-08-18 ENCOUNTER — Telehealth (HOSPITAL_COMMUNITY): Payer: Self-pay

## 2018-08-18 NOTE — Telephone Encounter (Signed)
Per Care Management note in the pts chart from 9/26:  Action/Plan: PTA Pt lived at home, recommendation for SNF, CSW consulted for placement needs.  No TCM call needed as the pt is going to a nursing facility at discharge from the hospital.

## 2018-08-18 NOTE — Telephone Encounter (Signed)
Attempted to call patient in regards to Cardiac Rehab - LM on VM 

## 2018-08-18 NOTE — Telephone Encounter (Signed)
Pt insurance is active and benefits verified through [facility]. Co-pay $20.00, DED $0.00/$0.00 met, out of pocket $4,400.00/$1,369.37 met, co-insurance 0%. No pre-authorization. Passport, 08/18/18 @ 10:18AM, EZV#[id number]  Will contact patient to see if she is interested in the Cardiac Rehab Program. If interested, patient will need to complete follow up appt. Once completed, patient will be contacted for scheduling upon review by the RN Navigator.

## 2018-08-21 NOTE — Progress Notes (Signed)
Cardiology Office Note:    Date:  08/22/2018   ID:  Michele Green, DOB 1941-07-10, MRN [id number]  PCP:  [name], MD  Cardiologist:  Sinclair Grooms, MD   Electrophysiologist:  None   Referring MD: [name], MD   Chief Complaint  Patient presents with  . Hospitalization Follow-up    s/p MV repair, CABG, Maze     History of Present Illness:    Michele Green is a 77 y.o. female with atrial fibrillation, coronary artery disease, hypertension, breast cancer, mitral regurgitation.  She was recently admitted 9/19-9/26 and underwent coronary artery bypass grafting (L-LAD), mitral valve repair and Maze procedure.  She was placed on prophylactic Amiodarone.  She was placed on Warfarin for anticoagulation post operatively.  She required Metoprolol tartrate and digoxin to control her HR. She remained in AFlutter at the time of DC.  She was DC to SNF.    [name] returns for follow up.  She is here with her sister.  She is currently staying at [facility].  She feels like she is ready to go home.  She is able to ambulate on her own.  She denies significant shortness of breath.  She has not had orthopnea, paroxysmal nocturnal dyspnea.  She has some swelling in her R ankle.  She does note swelling around her R thigh vein harvest.  Her appetite is ok.  She has not had any fevers.  Her chest is sore.    Prior CV studies:   The following studies were reviewed today:  Pre CABG dopplers 08/07/18 Final Interpretation: Right Carotid: Velocities in the right ICA are consistent with a 1-39% stenosis. Left Carotid: Velocities in the left ICA are consistent with a 1-39% stenosis. Vertebrals: Bilateral vertebral arteries demonstrate antegrade flow. Subclavians: Normal flow hemodynamics were seen in bilateral subclavian arteries. Right Upper Extremity: Doppler waveforms remain within normal limits with right radial compression. Doppler waveforms remain within normal limits with  right ulnar compression. Left Upper Extremity: Doppler waveforms remain within normal limits with left radial compression. Doppler waveforms remain within normal limits with left ulnar compression.  Cardiac Catheterization 06/20/18 LAD prox 65/90; D1 ost 90 LCx mid 85, OM2 50 RCA ost 30 EF 50-55  TEE 06/20/18 Mild conc LVH, EF 60-65, no RWMA, severe MR with mild post leaflet prolapse, massive LAE  Echo 06/08/18 Mild LVH, EF 60-65, no RWMA, mod ot severe MR, massive LAE, PASP 33   Past Medical History:  Diagnosis Date  . Atypical atrial flutter ([place]) 08/14/2018   Post-operative  . Breast cancer of upper-outer quadrant of left female breast ([place]) 02/02/2016  . CAD (coronary artery disease) 06/20/2018   LHC 7/19: pLAD 65/90, oD1 90, mLCx 85, OM2 50, oRCA 30, EF 50-55 >> s/p CABG in 9/19 (L-LAD)  . Colon polyp 02/2012  . Dupuytren contracture    bilateral hands  . Family history of breast cancer   . Hypertension   . Mitral regurgitation 11/07/2013   Echo 7/19: Mild LVH, EF 60-65, no RWMA, mod ot severe MR, massive LAE, PASP 33 // TEE 7/19:  Mild conc LVH, EF 60-65, no RWMA, severe MR with mild post leaflet prolapse, massive // s/p MV repair 07/2018  . On continuous oral anticoagulation 08/22/2015   Started on Xarelto 08/12/2015   . Osteopenia   . Persistent atrial fibrillation 08/22/2015   Started late August or early September 2016 // s/p Maze procedure 07/2018  . Radiation Therapy 04/21/16-05/19/16   left  breast 47.72 Gy, boosted to 10 Gy  . 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 S/P Maze operation for atrial fibrillation 08/10/2018   Complete bilateral atrial lesion set using bipolar radiofrequency and cryothermy ablation with clipping of LA appendage  . S/P MVR (mitral valve repair) 08/10/2018   Complex valvuloplasty including Gore-tex neochord placement x8, Plication of Lateral Commissure and 7m Sorin Memo 4D Ring Annuloplasty SN# [id number] . [place]   . Wears  glasses    Surgical Hx: The patient  has a past surgical history that includes Dupuytren contracture release (2001); Tonsillectomy (age 77; Dupuytren contracture release (1990); Colonoscopy; Dupuytren contracture release (Right, 05/02/2014); Cardioversion (N/A, 10/02/2015); Breast lumpectomy with radioactive seed localization (Left, 02/23/2016); Breast lumpectomy (Left, 02/23/2016); Breast biopsy (Left, 01/28/2016); TEE without cardioversion (N/A, 06/20/2018); RIGHT/LEFT HEART CATH AND CORONARY ANGIOGRAPHY (N/A, 06/20/2018); Mitral valve repair (N/A, 08/10/2018); Coronary artery bypass graft (N/A, 08/10/2018); MAZE (N/A, 08/10/2018); TEE without cardioversion (N/A, 08/10/2018); and Clipping of atrial appendage (08/10/2018).   Current Medications: Current Meds  Medication Sig  . acetaminophen (TYLENOL) 325 MG tablet Take 2 tablets (650 mg total) by mouth every 6 (six) hours as needed for mild pain or fever.  .Marland Kitchenallopurinol (ZYLOPRIM) 100 MG tablet Take 200 mg by mouth daily.   .Marland Kitchenamiodarone (PACERONE) 400 MG tablet Take 1 tablet (400 mg total) by mouth 2 (two) times daily. X 7 days, then decrease to 200 mg BID x 7 days, then decrease to 200 mg daily  . aspirin EC 81 MG EC tablet Take 1 tablet (81 mg total) by mouth daily.  . digoxin 62.5 MCG TABS Take 0.0625 mg by mouth daily.  . furosemide (LASIX) 40 MG tablet Take 1 tablet (40 mg total) by mouth daily. For 7 Days, then resume patients dose of 20 mg daily  . LIALDA 1.2 G EC tablet Take 4.8 g by mouth daily.   . metoprolol tartrate (LOPRESSOR) 100 MG tablet Take 1 tablet (100 mg total) by mouth 2 (two) times daily.  . potassium chloride SA (K-DUR,KLOR-CON) 20 MEQ tablet Take 1 tablet (20 mEq total) by mouth daily.  . simvastatin (ZOCOR) 20 MG tablet Take 20 mg by mouth daily.   . traMADol (ULTRAM) 50 MG tablet Take 1 tablet (50 mg total) by mouth every 4 (four) hours as needed for moderate pain.  .Marland Kitchenwarfarin (COUMADIN) 1 MG tablet Take 1 tablet (1 mg total) by  mouth daily at 6 PM.     Allergies:   Shellfish allergy   Social History   Tobacco Use  . Smoking status: Former Smoker    Packs/day: 1.00    Years: 20.00    Pack years: 20.00    Types: Cigarettes    Last attempt to quit: 11/22/1992    Years since quitting: 25.7  . Smokeless tobacco: Never Used  Substance Use Topics  . Alcohol use: Yes    Alcohol/week: 3.0 standard drinks    Types: 3 Standard drinks or equivalent per week    Comment: social  . Drug use: No     Family Hx: The patient's family history includes Breast cancer in her sister; Breast cancer (age of onset: 777 in her mother; Cirrhosis in her mother; Heart attack in her father; Heart failure in her father; Kidney Stones in her sister. There is no history of Osteoporosis.  ROS:   Please see the history of present illness.    ROS All other systems reviewed and  are negative.   EKGs/Labs/Other Test Reviewed:    EKG:  EKG is   ordered today.  The ekg ordered today demonstrates atrial fibrillation, HR 89, diffuse down sloping TW inversions (?dig effect), QTc 474  Recent Labs: 05/30/2018: NT-Pro BNP 5,122; TSH 1.200 08/07/2018: ALT 14 08/14/2018: Hemoglobin 9.1; Platelets 87 08/16/2018: BUN 10; Creatinine, Ser 0.68; Magnesium 1.9; Potassium 3.5; Sodium 137   Recent Lipid Panel No results found for: CHOL, TRIG, HDL, CHOLHDL, LDLCALC, LDLDIRECT  Physical Exam:    VS:  BP 108/60   Pulse 89   Ht 5' 4"  (1.626 m)   Wt 121 lb (54.9 kg)   SpO2 91%   BMI 20.77 kg/m     Wt Readings from Last 3 Encounters:  08/22/18 121 lb (54.9 kg)  08/17/18 130 lb 8 oz (59.2 kg)  08/07/18 127 lb 9.6 oz (57.9 kg)     Physical Exam  Constitutional: She is oriented to person, place, and time. She appears well-developed and well-nourished. No distress.  HENT:  Head: Normocephalic and atraumatic.  Eyes: No scleral icterus.  Neck: No JVD present. No thyromegaly present.  Cardiovascular: Normal rate. An irregularly irregular rhythm  present.  Murmur heard.  Systolic murmur is present with a grade of 2/6 at the lower left sternal border. Pulmonary/Chest: Effort normal. She has no wheezes. She has no rales.  Median sternotomy well healed without erythema or discharge  Abdominal: Soft.  Musculoskeletal: She exhibits edema (1+ bilat ankle edema).  R leg vein harvest site with hematoma and ecchymosis; wound without discharge  Lymphadenopathy:    She has no cervical adenopathy.  Neurological: She is alert and oriented to person, place, and time.  Skin: Skin is warm and dry.  Psychiatric: She has a normal mood and affect.    ASSESSMENT & PLAN:    Coronary artery disease involving native coronary artery of native heart without angina pectoris  She is s/p 1v CABG with L-LAD.  She is progressing well at rehab. She is still somewhat sore.  She has a wound check this week at Dr. Guy Sandifer office.  Her R thigh wound looks stable to me. She has a hematoma.  But, I do not see evidence of infection.  I have asked her to show her leg wound to the RN later this week.  She will continue on ASA, statin, beta-blocker.    History of mitral valve repair  As noted, she is progressing well s/p recent MV repair.  She will eventually need a follow up echocardiogram. This will be arranged at some point in the next 8-12 weeks.  As she is still in atrial fibrillation, I will hold off on ordering the echocardiogram until we decide on management of her rhythm.  Continue SBE prophylaxis.    Persistent Atrial Fibrillation ([place]) Her heart rate is controlled.  She is currently on Warfarin which is managed at [facility].  She remains on Amiodarone, Metoprolol tartrate and digoxin.  I discussed her case with [name].  She seems to be tolerating atrial fibrillation well.  We will continue her on Amiodarone and see how she does over the next 6-8 weeks.  If she remains in atrial fibrillation at follow up, we can consider proceeding with DCCV at that time.     -Obtain Digoxin level, BMET today   -Continue Amiodarone for now  -Follow up with Dr. Zigmund Daniel on Care Team in 6-8 weeks to decide on +/- DCCV  Essential hypertension  The patient's blood pressure is controlled  on her current regimen.  Continue current therapy.    Hyperlipidemia, unspecified hyperlipidemia type  Continue statin.    Dispo:  Return in about 8 weeks (around 10/17/2018) for Routine Follow Up w/ [name].   Medication Adjustments/Labs and Tests Ordered: Current medicines are reviewed at length with the patient today.  Concerns regarding medicines are outlined above.  Tests Ordered: Orders Placed This Encounter  Procedures  . Basic metabolic panel  . Digoxin level  . EKG 12-Lead   Medication Changes: No orders of the defined types were placed in this encounter.   Signed, Ronnette Rump, PA-C  08/22/2018 11:15 AM    [facility] [place], [place], [place]  [address] Phone: [phone]; Fax: [phone]

## 2018-08-22 ENCOUNTER — Ambulatory Visit: Payer: Medicare Other | Admitting: Physician Assistant

## 2018-08-22 ENCOUNTER — Encounter: Payer: Self-pay | Admitting: Physician Assistant

## 2018-08-22 ENCOUNTER — Ambulatory Visit: Payer: Medicare Other | Admitting: Interventional Cardiology

## 2018-08-22 VITALS — BP 108/60 | HR 89 | Ht 64.0 in | Wt 121.0 lb

## 2018-08-22 DIAGNOSIS — I2510 Atherosclerotic heart disease of native coronary artery without angina pectoris: Secondary | ICD-10-CM | POA: Diagnosis not present

## 2018-08-22 DIAGNOSIS — I484 Atypical atrial flutter: Secondary | ICD-10-CM

## 2018-08-22 DIAGNOSIS — E785 Hyperlipidemia, unspecified: Secondary | ICD-10-CM

## 2018-08-22 DIAGNOSIS — Z9889 Other specified postprocedural states: Secondary | ICD-10-CM | POA: Diagnosis not present

## 2018-08-22 DIAGNOSIS — I10 Essential (primary) hypertension: Secondary | ICD-10-CM | POA: Diagnosis not present

## 2018-08-22 LAB — BASIC METABOLIC PANEL
BUN/Creatinine Ratio: 14 (ref 12–28)
BUN: 14 mg/dL (ref 8–27)
CALCIUM: 8.9 mg/dL (ref 8.7–10.3)
CO2: 25 mmol/L (ref 20–29)
CREATININE: 0.99 mg/dL (ref 0.57–1.00)
Chloride: 99 mmol/L (ref 96–106)
GFR calc Af Amer: 63 mL/min/{1.73_m2} (ref >59)
GFR, EST NON AFRICAN AMERICAN: 54 mL/min/{1.73_m2} — AB (ref >59)
Glucose: 89 mg/dL (ref 65–99)
Potassium: 4.1 mmol/L (ref 3.5–5.2)
Sodium: 141 mmol/L (ref 134–144)

## 2018-08-22 LAB — DIGOXIN LEVEL: Digoxin, Serum: 0.6 ng/mL (ref 0.5–0.9)

## 2018-08-22 NOTE — Patient Instructions (Addendum)
Medication Instructions:   Your physician recommends that you continue on your current medications as directed. Please refer to the Current Medication list given to you today.   If you need a refill on your cardiac medications before your next appointment, please call your pharmacy.  Labwork: BMET  AND DIGOXIN     Testing/Procedures: NONE ORDERED  TODAY    Follow-Up: DR Tamala Julian IN 6- 8 WEEKS  OR WITH WEAVER OF INGOLD  ON A DAY SMITH IS IN CLINIC   Any Other Special Instructions Will Be Listed Below (If Applicable).

## 2018-08-23 ENCOUNTER — Other Ambulatory Visit: Payer: Self-pay | Admitting: Thoracic Surgery (Cardiothoracic Vascular Surgery)

## 2018-08-23 ENCOUNTER — Telehealth: Payer: Self-pay

## 2018-08-23 DIAGNOSIS — Z951 Presence of aortocoronary bypass graft: Secondary | ICD-10-CM

## 2018-08-23 NOTE — Telephone Encounter (Signed)
-----   Message from Liliane Shi, [place] sent at 08/22/2018  4:47 PM EDT ----- All values are normal or within acceptable limits.   Medication changes / Follow up labs / Other changes or recommendations:    - Continue current medications and follow up as planned.   - Please fax labs to [facility] (they requested a copy) Nyriah Coote, PA-C 08/22/2018 4:47 PM

## 2018-08-23 NOTE — Telephone Encounter (Signed)
Notes recorded by Frederik Schmidt, RN on 08/23/2018 at 9:37 AM EDT Spoke to patient and gave her lab results/recommendations. She verbalized understanding. I will fax [phone]) results to [facility].

## 2018-08-24 ENCOUNTER — Ambulatory Visit (INDEPENDENT_AMBULATORY_CARE_PROVIDER_SITE_OTHER): Payer: Self-pay | Admitting: Physician Assistant

## 2018-08-24 VITALS — BP 100/60 | HR 92 | Resp 20 | Ht 64.0 in | Wt 121.0 lb

## 2018-08-24 DIAGNOSIS — Z5189 Encounter for other specified aftercare: Secondary | ICD-10-CM

## 2018-08-24 NOTE — Progress Notes (Signed)
HPI:  Patient is s/p CABG x 1, mitral valve repair, clipping of LA appendage, and MAZE by Dr. Roxy Manns on 08/10/2018. She presents today for follow up of sternal drainage. She has a history of long standing a fib and has been on anticoagulation. She had a flutter at the time of discharge from the hospital on 08/17/2018. She was discharged to SNF. Per the patient, she will be discharged home from SNF tomorrow. Since discharge, she denies fever, chills, or drainage from wounds. She does have some sternal pain.  Current Outpatient Medications  Medication Sig Dispense Refill  . acetaminophen (TYLENOL) 325 MG tablet Take 2 tablets (650 mg total) by mouth every 6 (six) hours as needed for mild pain or fever.    [name] allopurinol (ZYLOPRIM) 100 MG tablet Take 200 mg by mouth daily.     [name] amiodarone (PACERONE) 400 MG tablet Take 1 tablet (400 mg total) by mouth 2 (two) times daily. X 7 days, then decrease to 200 mg BID x 7 days, then decrease to 200 mg daily    . aspirin EC 81 MG EC tablet Take 1 tablet (81 mg total) by mouth daily.    . digoxin 62.5 MCG TABS Take 0.0625 mg by mouth daily.    . furosemide (LASIX) 40 MG tablet Take 1 tablet (40 mg total) by mouth daily. For 7 Days, then resume patients dose of 20 mg daily 7 tablet 0  . LIALDA 1.2 G EC tablet Take 4.8 g by mouth daily.     . metoprolol tartrate (LOPRESSOR) 100 MG tablet Take 1 tablet (100 mg total) by mouth 2 (two) times daily.    . potassium chloride SA (K-DUR,KLOR-CON) 20 MEQ tablet Take 1 tablet (20 mEq total) by mouth daily. 90 tablet 3  . simvastatin (ZOCOR) 20 MG tablet Take 20 mg by mouth daily.     . traMADol (ULTRAM) 50 MG tablet Take 1 tablet (50 mg total) by mouth every 4 (four) hours as needed for moderate pain. 30 tablet 0  . warfarin (COUMADIN) 1 MG tablet Take 1 tablet (1 mg total) by mouth daily at 6 PM.    Vital Signs: BP 110/60, HR 92, RR 20,Oxygenation 98% on room air   Physical Exam: CV-IRRR IRRR Pulmonary-Clear to  ausculation bilaterally Extremities-Trace RLE edema. Hematome right thigh-no sign of infection Wounds-Sternum is clean and dry  Impression and Plan: Overall, patient is recovering well from coronary artery bypass grafting surgery, mitral valve repair, LA clip, and MAZE. She has already seen cardiology in follow up this past Tuesday. EKG showed she was in controlled rate a fib. No changes were made to her medications. Her QTc was slightly elevated. I instructed her take Amiodarone 200 mg daily once discharged from the SNF. We will have to monitor her BP as she is on Lopressor 100 mg bid. Her BP is labile on this visit, but she is asymptomatic. She has no more sternal wound drainage. Steri strips were removed from chest tube sites. Wounds were mostly healed and no sign of infection. Patient asked when she could drive. I told her ideally, after seen by Dr.Owen on 10/21. I instructed her to continue with sternal precautions, (i.e. No lifting more than 10 pounds for 4-6 more weeks). She states the facility is checking her PT and INR as is on Coumadin. I  told her to call [facility] for a PT and INR appointment (as is on Coumadin for a fib/fl) for Monday 10/07. She already  has a follow up to see both cardiology and Dr. Roxy Manns in several weeks.     Nani Skillern, PA-C [facility] [phone]

## 2018-08-24 NOTE — Patient Instructions (Addendum)
Continue to avoid any heavy lifting or strenuous use of your arms or shoulders for at least a total of three months from the time of surgery.  After three months, you may gradually increase how much you lift or otherwise use your arms or chest as tolerated, with limits based upon whether or not activities lead to the return of significant discomfort.

## 2018-08-27 DIAGNOSIS — I2510 Atherosclerotic heart disease of native coronary artery without angina pectoris: Secondary | ICD-10-CM | POA: Diagnosis not present

## 2018-08-28 ENCOUNTER — Ambulatory Visit (INDEPENDENT_AMBULATORY_CARE_PROVIDER_SITE_OTHER): Payer: Medicare Other | Admitting: *Deleted

## 2018-08-28 ENCOUNTER — Telehealth: Payer: Self-pay | Admitting: *Deleted

## 2018-08-28 DIAGNOSIS — Z7901 Long term (current) use of anticoagulants: Secondary | ICD-10-CM

## 2018-08-28 DIAGNOSIS — I4819 Other persistent atrial fibrillation: Secondary | ICD-10-CM

## 2018-08-28 DIAGNOSIS — Z9889 Other specified postprocedural states: Secondary | ICD-10-CM | POA: Diagnosis not present

## 2018-08-28 DIAGNOSIS — I340 Nonrheumatic mitral (valve) insufficiency: Secondary | ICD-10-CM

## 2018-08-28 DIAGNOSIS — I484 Atypical atrial flutter: Secondary | ICD-10-CM

## 2018-08-28 LAB — POCT INR: INR: 2.7 (ref 2.0–3.0)

## 2018-08-28 NOTE — Telephone Encounter (Signed)
Patient's sister called back after the pt was seen in the office and stated that [facility] would be going to see the pt next week. Called [facility] and gave orders to have INR checked on 09/05/18 and to call to the Coumadin Clinic & telephone number provided into the Clinic.

## 2018-08-28 NOTE — Patient Instructions (Addendum)
A full discussion of the nature of anticoagulants has been carried out.  A benefit risk analysis has been presented to the patient, so that they understand the justification for choosing anticoagulation at this time. The need for frequent and regular monitoring, precise dosage adjustment and compliance is stressed.  Side effects of potential bleeding are discussed.  The patient should avoid any OTC items containing aspirin or ibuprofen, and should avoid great swings in general diet.  Avoid alcohol consumption.  Call if any signs of abnormal bleeding.   Description   Continue taking 70m daily. Recheck INR in 1 week. Coumadin Clinic #[phone]#[phone]

## 2018-08-30 ENCOUNTER — Telehealth: Payer: Self-pay

## 2018-08-30 ENCOUNTER — Ambulatory Visit: Payer: Medicare Other | Admitting: Nurse Practitioner

## 2018-08-30 NOTE — Telephone Encounter (Signed)
[facility], [facility] manager with Ms. Armwood contacted the office to get verbal orders over the phone for Home health nursing for 2x wk for 4 wks, 1x wk for 3 wk, and 2x wk for 2 wks.  She also requested VO for PT and OT to evaluate and treat 2x wk for 1 wk.  VO given and will await fax to be signed by physician.

## 2018-09-01 ENCOUNTER — Ambulatory Visit (INDEPENDENT_AMBULATORY_CARE_PROVIDER_SITE_OTHER): Payer: Medicare Other | Admitting: Cardiovascular Disease

## 2018-09-01 DIAGNOSIS — Z7901 Long term (current) use of anticoagulants: Secondary | ICD-10-CM

## 2018-09-01 DIAGNOSIS — I4819 Other persistent atrial fibrillation: Secondary | ICD-10-CM

## 2018-09-01 DIAGNOSIS — Z9889 Other specified postprocedural states: Secondary | ICD-10-CM

## 2018-09-01 DIAGNOSIS — I340 Nonrheumatic mitral (valve) insufficiency: Secondary | ICD-10-CM | POA: Diagnosis not present

## 2018-09-01 DIAGNOSIS — I484 Atypical atrial flutter: Secondary | ICD-10-CM

## 2018-09-01 LAB — POCT INR: INR: 2.3 (ref 2.0–3.0)

## 2018-09-05 ENCOUNTER — Ambulatory Visit (INDEPENDENT_AMBULATORY_CARE_PROVIDER_SITE_OTHER): Payer: Medicare Other

## 2018-09-05 ENCOUNTER — Other Ambulatory Visit: Payer: Self-pay

## 2018-09-05 DIAGNOSIS — I484 Atypical atrial flutter: Secondary | ICD-10-CM

## 2018-09-05 DIAGNOSIS — Z7901 Long term (current) use of anticoagulants: Secondary | ICD-10-CM | POA: Diagnosis not present

## 2018-09-05 DIAGNOSIS — I4819 Other persistent atrial fibrillation: Secondary | ICD-10-CM | POA: Diagnosis not present

## 2018-09-05 DIAGNOSIS — Z9889 Other specified postprocedural states: Secondary | ICD-10-CM

## 2018-09-05 DIAGNOSIS — I340 Nonrheumatic mitral (valve) insufficiency: Secondary | ICD-10-CM

## 2018-09-05 LAB — POCT INR: INR: 3.3 — AB (ref 2.0–3.0)

## 2018-09-05 NOTE — Telephone Encounter (Signed)
Tiffany, RN [facility] called pt's INR results.  Results addressed see anticoagulation note in Epic.  She also states pt needs a refill of her Amiodarone [facility]m [facility] [address]  Thanks

## 2018-09-05 NOTE — Patient Instructions (Signed)
Description   Spoke with Tiffany, [facility] RN while in home with pt advised to have pt take 1/2 tablet today, then resume same dosage 45m daily. Recheck INR in 1 week at appt with LCecilie Kickson 09/12/18. Call TJonelle Sidle [facility] RN [phone] redraw orders for next check, ok to leave message on machine with orders if she does not answer.  Coumadin Clinic #[phone]#[phone]

## 2018-09-06 ENCOUNTER — Other Ambulatory Visit: Payer: Self-pay | Admitting: *Deleted

## 2018-09-06 MED ORDER — AMIODARONE HCL 200 MG PO TABS: 200.0000 mg | ORAL_TABLET | Freq: Every day | ORAL | 11 refills | Status: DC

## 2018-09-11 ENCOUNTER — Ambulatory Visit
Admission: RE | Admit: 2018-09-11 | Discharge: 2018-09-11 | Disposition: A | Discharge status: Home / Self Care | Payer: Medicare Other | Source: Ambulatory Visit | Attending: Thoracic Surgery (Cardiothoracic Vascular Surgery) | Admitting: Thoracic Surgery (Cardiothoracic Vascular Surgery)

## 2018-09-11 ENCOUNTER — Other Ambulatory Visit: Payer: Self-pay

## 2018-09-11 ENCOUNTER — Other Ambulatory Visit: Payer: Self-pay | Admitting: *Deleted

## 2018-09-11 ENCOUNTER — Encounter: Payer: Self-pay | Admitting: Thoracic Surgery (Cardiothoracic Vascular Surgery)

## 2018-09-11 ENCOUNTER — Ambulatory Visit (INDEPENDENT_AMBULATORY_CARE_PROVIDER_SITE_OTHER): Payer: Self-pay | Admitting: Thoracic Surgery (Cardiothoracic Vascular Surgery)

## 2018-09-11 VITALS — BP 104/68 | HR 95 | Resp 16 | Ht 64.0 in | Wt 117.0 lb

## 2018-09-11 DIAGNOSIS — Z951 Presence of aortocoronary bypass graft: Secondary | ICD-10-CM

## 2018-09-11 DIAGNOSIS — Z8679 Personal history of other diseases of the circulatory system: Secondary | ICD-10-CM

## 2018-09-11 DIAGNOSIS — Z9889 Other specified postprocedural states: Secondary | ICD-10-CM

## 2018-09-11 DIAGNOSIS — R350 Frequency of micturition: Secondary | ICD-10-CM

## 2018-09-11 MED ORDER — TRAMADOL HCL 50 MG PO TABS: 50.0000 mg | ORAL_TABLET | ORAL | 0 refills | Status: DC | PRN

## 2018-09-11 NOTE — Progress Notes (Signed)
[address]       [place], [address]             [phone]     CARDIOTHORACIC SURGERY OFFICE NOTE  Referring Provider is [name], MD PCP is [name], MD   HPI:  Patient is a 77 year old female with history of mitral regurgitation, long-standing persistent atrial fibrillation on oral anticoagulation, hypertension, chronic diastolic congestive heart failure, previous spine surgery for management ofcompression fracture of the lumbar spine, and remote history of breast cancer who returns to the office today for routine follow-up status post mitral valve repair, coronary artery bypass grafting x1, and Maze procedure on August 10, 2018.  Her early postoperative recovery was uncomplicated with exception of some postoperative atypical atrial flutter.  She was eventually discharged home on the sixth postoperative day.   She was seen in follow-up by Richardson Dopp at [facility] on August 22, 2018.  EKG performed at that time revealed atrial fibrillation with controlled ventricular rate.  Since hospital discharge she was also seen in our office on August 24, 2018 for wound check.  She returns the office today for routine follow-up.  She reports that overall she is doing very well.  She still has some soreness along the right side of her chest.  She states that she still feels tired.  She denies shortness of breath and infection notes that her breathing is already better than it was prior to surgery.  Appetite is slowly improving.  She is having some trouble sleeping at night because of difficulty getting comfortable.  She also reports some dysuria and is afraid that she might be developing a bladder infection.  The remainder of her review of systems is unrevealing.   Current Outpatient Medications  Medication Sig Dispense Refill  . acetaminophen (TYLENOL) 325 MG tablet Take 2 tablets (650 mg total) by mouth every 6 (six) hours as needed for mild pain or fever.     [name] allopurinol (ZYLOPRIM) 100 MG tablet Take 200 mg by mouth daily.     [name] amiodarone (PACERONE) 200 MG tablet Take 1 tablet (200 mg total) by mouth daily. 30 tablet 11  . aspirin EC 81 MG EC tablet Take 1 tablet (81 mg total) by mouth daily.    . digoxin 62.5 MCG TABS Take 0.0625 mg by mouth daily.    . furosemide (LASIX) 20 MG tablet Take 20 mg by mouth.    [name] LIALDA 1.2 G EC tablet Take 4.8 g by mouth daily.     . metoprolol tartrate (LOPRESSOR) 100 MG tablet Take 1 tablet (100 mg total) by mouth 2 (two) times daily.    . potassium chloride SA (K-DUR,KLOR-CON) 20 MEQ tablet Take 1 tablet (20 mEq total) by mouth daily. 90 tablet 3  . simvastatin (ZOCOR) 20 MG tablet Take 20 mg by mouth daily.     . traMADol (ULTRAM) 50 MG tablet Take 1 tablet (50 mg total) by mouth every 4 (four) hours as needed for moderate pain. 30 tablet 0  . warfarin (COUMADIN) 1 MG tablet Take 1 tablet (1 mg total) by mouth daily at 6 PM. (Patient taking differently: Take 1 mg by mouth daily at 6 PM. AS DIRECTED)     No current facility-administered medications for this visit.       Physical Exam:   BP 104/68 (BP Location: Right Arm, Patient Position: Sitting, Cuff Size: Normal) Comment (Cuff Size): MANUALLY  Pulse 95  Resp 16   Ht 5' 4"  (1.626 m)   Wt 117 lb (53.1 kg)   SpO2 96% Comment: ON RA  BMI 20.08 kg/m   General:  Well-appearing  Chest:   Clear to auscultation  CV:   Regular rate and rhythm without murmur  Incisions:  Healing nicely, sternum is stable  Abdomen:  Soft nontender  Extremities:  Warm and well-perfused, no edema  Diagnostic Tests:  CHEST - 2 VIEW  COMPARISON:  Prior chest x-ray 08/14/2018  FINDINGS: Patient is status post median sternotomy with evidence of mitral valve repair and left atrial appendage ligation. Stable cardiac and mediastinal contours. Atherosclerotic calcifications are present in the transverse aorta. Overall, improved inspiratory volumes with decreased  bibasilar atelectasis. There may be trace bilateral pleural effusions. No evidence of pulmonary edema, focal airspace consolidation or pneumothorax. No acute osseous abnormality.  IMPRESSION: 1. Stable cardiomegaly without evidence of pulmonary edema. 2. Perhaps trace bibasilar pleural effusions with associated minimal bibasilar atelectasis. 3.  Aortic Atherosclerosis (ICD10-170.0)   Electronically Signed   By: Jacqulynn Cadet M.D.   On: 09/11/2018 12:56   2 channel telemetry rhythm strip demonstrates regular rhythm with what may be small P waves suggestive of sinus rhythm or ectopic atrial rhythm    Impression:  Patient is making good progress approximately 1 month status post mitral valve repair, coronary artery bypass grafting, and Maze procedure.  Plan:  I have encouraged the patient to continue to gradually increase her physical activity as tolerated.  We have not recommended any changes to her current medications.  I have given her a refill prescription for tramadol to help her with postoperative chest wall discomfort, particularly to be used at bedtime to help her sleep at night.  Once she is no longer requiring pain relievers during the daytime she may resume driving an automobile.  I have encouraged her to enroll and participate in the cardiac rehab program.  We will obtain a urine specimen for urinalysis and routine culture.  Patient will continue to follow-up with [name] and to continue to have her Coumadin dose monitored through the Coumadin clinic.  She will return to our office for routine follow-up and rhythm check in 2 months.   Valentina Gu. Roxy Manns, MD 09/11/2018 1:14 PM

## 2018-09-11 NOTE — Progress Notes (Signed)
Cardiology Office Note   Date:  09/12/2018   ID:  Michele Green, DOB 05/05/1941, MRN [id number]  PCP:  [name], MD  Cardiologist:    [name]  Chief Complaint  Patient presents with  . Atrial Flutter      History of Present Illness: Michele Green is a 77 y.o. female who presents for CAD, a fib She has a history of atrial fib - she has elected to be managed with rate control and Xarelto. Other issues include HTN, &breast cancer. atrial fibrillation, coronary artery disease, hypertension, breast cancer, mitral regurgitation.  She was recently admitted 9/19-9/26 and underwent coronary artery bypass grafting (L-LAD), mitral valve repair and Maze procedure.  She was placed on prophylactic Amiodarone.  She was placed on Warfarin for anticoagulation post operatively.  She required Metoprolol tartrate and digoxin to control her HR. She remained in AFlutter at the time of DC.  She was DC to SNF.    Seen here in early July by myself - HR was not controlled. Her lanoxin was added back to get better rate control.[name] then saw her a few weeks later - she was doing well with improvement in her HR. She had had her echo updated - this showed worsening MR - she has subsequently had TEE and cath and has plans for surgical intervention.   Here today after surgery on 08/10/18, second post op visit.   Coumadin is doing well with no bleeding.  She would like to come off.  Dr. Roxy Manns would like her on it for 3 months, for MV Repair with ring.  Then we would change to Xarelto.  She does not mind staying on anticoagulation just not coumadin.  She has had 2 episodes of dizziness several weeks apart.  Usually when standing.  Her lasix is every other day.  Her appetite is decreased on amiodarone.  HR is controlled.  Denies any bleeding. No awareness of rapid HR    She remains in a flutter.     Past Medical History:  Diagnosis Date  . Atypical atrial flutter ([place]) 08/14/2018   Post-operative  . Breast cancer of upper-outer quadrant of left female breast ([place]) 02/02/2016  . CAD (coronary artery disease) 06/20/2018   LHC 7/19: pLAD 65/90, oD1 90, mLCx 85, OM2 50, oRCA 30, EF 50-55 >> s/p CABG in 9/19 (L-LAD)  . Colon polyp 02/2012  . Dupuytren contracture    bilateral hands  . Family history of breast cancer   . Hypertension   . Mitral regurgitation 11/07/2013   Echo 7/19: Mild LVH, EF 60-65, no RWMA, mod ot severe MR, massive LAE, PASP 33 // TEE 7/19:  Mild conc LVH, EF 60-65, no RWMA, severe MR with mild post leaflet prolapse, massive // s/p MV repair 07/2018  . On continuous oral anticoagulation 08/22/2015   Started on Xarelto 08/12/2015   . Osteopenia   . Persistent atrial fibrillation 08/22/2015   Started late August or early September 2016 // s/p Maze procedure 07/2018  . Radiation Therapy 04/21/16-05/19/16   left breast 47.72 Gy, boosted to 10 Gy  . 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 S/P Maze operation for atrial fibrillation 08/10/2018   Complete bilateral atrial lesion set using bipolar radiofrequency and cryothermy ablation with clipping of LA appendage  . S/P MVR (mitral valve repair) 08/10/2018   Complex valvuloplasty including Gore-tex neochord placement x8, Plication of Lateral Commissure and 59m Sorin Memo 4D Ring Annuloplasty SN#  [id number]  . Ulcerative colitis ([place])   . Wears glasses     Past Surgical History:  Procedure Laterality Date  . BREAST BIOPSY Left 01/28/2016  . BREAST LUMPECTOMY Left 02/23/2016  . BREAST LUMPECTOMY WITH RADIOACTIVE SEED LOCALIZATION Left 02/23/2016   Procedure: BREAST LUMPECTOMY WITH RADIOACTIVE SEED LOCALIZATION;  Surgeon: [name], MD;  Location: [place];  Service: General;  Laterality: Left;  . CARDIOVERSION N/A 10/02/2015   Procedure: CARDIOVERSION;  Surgeon: Jerline Pain, MD;  Location: [place];  Service: Cardiovascular;  Laterality: N/A;  . CLIPPING OF ATRIAL APPENDAGE  08/10/2018     Procedure: CLIPPING OF ATRIAL APPENDAGE;  Surgeon: Rexene Alberts, MD;  Location: [facility];  Service: Open Heart Surgery;;  . COLONOSCOPY    . CORONARY ARTERY BYPASS GRAFT N/A 08/10/2018   Procedure: CORONARY ARTERY BYPASS GRAFTING (CABG) x 1, LIMA-LAD,  USING LEFT INTERNAL MAMMARY ARTERY. HARVESTED RIGHT GREATER SAPHENOUS VEIN ENDOSCOPICALLY;  Surgeon: Rexene Alberts, MD;  Location: [facility];  Service: Open Heart Surgery;  Laterality: N/A;  . DUPUYTREN CONTRACTURE RELEASE  2001   leftx2  . [place]   right  . DUPUYTREN CONTRACTURE RELEASE Right 05/02/2014   Procedure: EXCISION DUPUYTRENS RIGHT PALMAR/SMALL ;  Surgeon: Cammie Sickle, MD;  Location: [place] [facility];  Service: Orthopedics;  Laterality: Right;  [name] MAZE N/A 08/10/2018   Procedure: MAZE;  Surgeon: Rexene Alberts, MD;  Location: [place];  Service: Open Heart Surgery;  Laterality: N/A;  . MITRAL VALVE REPAIR N/A 08/10/2018   Procedure: MITRAL VALVE REPAIR (MVR);  Surgeon: Rexene Alberts, MD;  Location: [facility];  Service: Open Heart Surgery;  Laterality: N/A;  glutaraldehyde  . RIGHT/LEFT HEART CATH AND CORONARY ANGIOGRAPHY N/A 06/20/2018   Procedure: RIGHT/LEFT HEART CATH AND CORONARY ANGIOGRAPHY;  Surgeon: [name], MD;  Location: [facility] [facility];  Service: Cardiovascular;  Laterality: N/A;  . TEE WITHOUT CARDIOVERSION N/A 06/20/2018   Procedure: TRANSESOPHAGEAL ECHOCARDIOGRAM (TEE);  Surgeon: Pixie Casino, MD;  Location: [facility] ENDOSCOPY;  Service: Cardiovascular;  Laterality: N/A;  . TEE WITHOUT CARDIOVERSION N/A 08/10/2018   Procedure: TRANSESOPHAGEAL ECHOCARDIOGRAM (TEE);  Surgeon: Rexene Alberts, MD;  Location: [facility];  Service: Open Heart Surgery;  Laterality: N/A;  . TONSILLECTOMY  age 26     Current Outpatient Medications  Medication Sig Dispense Refill  . acetaminophen (TYLENOL) 325 MG tablet Take 2 tablets (650 mg total) by mouth every 6 (six) hours as needed for mild pain or  fever.    [name] allopurinol (ZYLOPRIM) 100 MG tablet Take 200 mg by mouth daily.     [name] amiodarone (PACERONE) 200 MG tablet Take 1 tablet (200 mg total) by mouth daily. 30 tablet 11  . aspirin EC 81 MG EC tablet Take 1 tablet (81 mg total) by mouth daily.    . digoxin 62.5 MCG TABS Take 0.0625 mg by mouth daily.    . furosemide (LASIX) 20 MG tablet Take 20 mg by mouth.    [name] LIALDA 1.2 G EC tablet Take 4.8 g by mouth daily.     . metoprolol tartrate (LOPRESSOR) 100 MG tablet Take 1 tablet (100 mg total) by mouth 2 (two) times daily.    . potassium chloride SA (K-DUR,KLOR-CON) 20 MEQ tablet Take 1 tablet (20 mEq total) by mouth daily. 90 tablet 3  . simvastatin (ZOCOR) 20 MG tablet Take 20 mg by mouth daily.     . traMADol (  ULTRAM) 50 MG tablet Take 1 tablet (50 mg total) by mouth every 4 (four) hours as needed for moderate pain. 28 tablet 0  . warfarin (COUMADIN) 1 MG tablet Take 1 tablet (1 mg total) by mouth daily at 6 PM. (Patient taking differently: Take 1 mg by mouth daily at 6 PM. AS DIRECTED)     No current facility-administered medications for this visit.     Allergies:   Shellfish allergy    Social History:  The patient  reports that she quit smoking about 25 years ago. Her smoking use included cigarettes. She has a 20.00 pack-year smoking history. She has never used smokeless tobacco. She reports that she drinks about 3.0 standard drinks of alcohol per week. She reports that she does not use drugs.   Family History:  The patient's family history includes Breast cancer in her sister; Breast cancer (age of onset: 66) in her mother; Cirrhosis in her mother; Heart attack in her father; Heart failure in her father; Kidney Stones in her sister.    ROS:  General:no colds or fevers, no weight changes Skin:no rashes or ulcers HEENT:no blurred vision, no congestion CV:see HPI--chest wall pain and ecchymosis PUL:see HPI GI:no diarrhea constipation or melena, no indigestion GU:no hematuria, no  dysuria MS:no joint pain, no claudication Neuro:no syncope, + lightheadedness Endo:no diabetes, no thyroid disease  Wt Readings from Last 3 Encounters:  09/12/18 120 lb 6.4 oz (54.6 kg)  09/11/18 117 lb (53.1 kg)  08/24/18 121 lb (54.9 kg)     PHYSICAL EXAM: VS:  BP (!) 100/58   Pulse 88   Ht 5' 4"  (1.626 m)   Wt 120 lb 6.4 oz (54.6 kg)   BMI 20.67 kg/m  , BMI Body mass index is 20.67 kg/m. General:Pleasant affect, NAD Skin:Warm and dry, brisk capillary refill HEENT:normocephalic, sclera clear, mucus membranes moist Neck:supple, no JVD, no bruits  Heart:irreg irreg without murmur, gallup, rub or click chest wall with ecchymosis Lungs:clear without rales, rhonchi, or wheezes ZLD:JTTS, non tender, + BS, do not palpate liver spleen or masses Ext:tr lower ext edema, 2+ pedal pulses, 2+ radial pulses Neuro:alert and oriented X 3, MAE, follows commands, + facial symmetry    EKG:  EKG is ordered today. The ekg ordered today demonstrates a flutter with Deep T wave inversion V3-V6. old   Recent Labs: 05/30/2018: NT-Pro BNP 5,122; TSH 1.200 08/07/2018: ALT 14 08/14/2018: Hemoglobin 9.1; Platelets 87 08/16/2018: Magnesium 1.9 08/22/2018: BUN 14; Creatinine, Ser 0.99; Potassium 4.1; Sodium 141    Lipid Panel No results found for: CHOL, TRIG, HDL, CHOLHDL, VLDL, LDLCALC, LDLDIRECT     Other studies Reviewed: Additional studies/ records that were reviewed today include: . TEE 06/20/18 Study Conclusions  - Left ventricle: The cavity size was normal. There was mild   concentric hypertrophy. Systolic function was normal. The   estimated ejection fraction was in the range of 60% to 65%. Wall   motion was normal; there were no regional wall motion   abnormalities. - Aorta: Mild atheromatous disease. - Mitral valve: Severe regurgitation. Degenerate leafelts with mild   posterior leaflet prolapse - no annular dilitation. Effective   regurgitant orifice (PISA): 0.82 cm^2. Regurgitant  volume (PISA):   113 ml. - Left atrium: Massively dilated. - Pulmonary veins: No anomaly. Reversal of flow noted in the LUPV. - Right atrium: No evidence of thrombus in the atrial cavity or   appendage. - Atrial septum: No defect or patent foramen ovale was identified.  Impressions:  -  Severe mitral regurgitation. No LAA thrombus. Massive LAE. LVEF   60-65%.  Echo 06/08/18 Study Conclusions  - Left ventricle: The cavity size was normal. Wall thickness was   increased in a pattern of mild LVH. Systolic function was normal.   The estimated ejection fraction was in the range of 60% to 65%.   Wall motion was normal; there were no regional wall motion   abnormalities. - Mitral valve: Mildly to moderately calcified annulus. Mildly   thickened, mildly calcified leaflets . There was moderate to   severe regurgitation. - Left atrium: The atrium was massively dilated. - Pulmonary arteries: Systolic pressure was mildly increased. PA   peak pressure: 33 mm Hg (S).  Cardiac cath 06/20/18  Severe mitral regurgitation with marked enlargement of the left atrium.  Mild pulmonary hypertension with mean pulmonary arterial pressure 28 mmHg.  Phasic pressure 45/16 mmHg  Three-vessel coronary calcification with significant greater than 80% distal circumflex, 90% first diagonal ostium, and 80 to 90% mid LAD.  Normal LV systolic function with EF 50 to 55%.  LVEDP 11 mmHg  Cardiac output 4.3 L/min/cardiac index 2.7 L/min/m  RECOMMENDATIONS:   Referred to Dr. Remus Loffler for consideration of surgical mitral valve repair left atrial appendage ligation, consideration of Maze procedure, and coronary revascularization.   OP Note 08/10/18  MITRAL VALVE REPAIR (MVR)  -Complex Valvular Annuloplasty -28 mm Sorin Memo 4D Ring -Placement of 8 Gore-tex Neochords -Debridement of Annular Calcification -Plication of Anterior Leaflet P3  CORONARY ARTERY BYPASS GRAFTING x 1 -Left Internal Mammary  Artery to Left Anterior Descending Artery  ENDOSCOPIC HARVEST GREATER SAPHENOUS VEIN -Right Thigh  MAZE  -Complete Bi-Atrial Lesion Set using Radiofrequency Ablation and Cryothermy -Clipping of Left Atrial Appendage with 45 mm Atricure Pro2Clip  ASSESSMENT AND PLAN:  1.  Atrial flutter rate controlled, continue DIG and amiodarone and BB.  On coumadin for now secondary to MV repair.  Once INR therapeutic for 4 weeks will plan DCCV discussed with [name].  Will have her come back in 3 weeks to arrange and check EKG.  This gives her heart time to heal.    2.  S/p MVR, CABG X 1 and MAZE procedure, healing well, now able to drive.  Will check echo post cardioversion.  3.  Anticoagulation on coumadin with INR followed by our office.  In 3 months can stop Coumadin per Dr. Roxy Manns and will change to Xarelto.    4.  HTN, controlled to lower end, mild orthostatic hypotension and lasix is 20 mg every other day.if continues with dizziness decrease lasix to twice a week.     5,  HLD continue statin.       Current medicines are reviewed with the patient today.  The patient Has no concerns regarding medicines.  The following changes have been made:  See above Labs/ tests ordered today include:see above  Disposition:   FU:  see above  Signed, Cecilie Kicks, NP  09/12/2018 10:04 AM    [facility] [facility] [place], [place] Phone: [phone]; Fax: [phone]

## 2018-09-11 NOTE — H&P (View-Only) (Signed)
Cardiology Office Note   Date:  09/12/2018   ID:  Michele Green, DOB 12/24/1940, MRN [id number]  PCP:  Michele Neer, MD  Cardiologist:    [name]  Chief Complaint  Patient presents with  . Atrial Flutter      History of Present Illness: Michele Green is a 77 y.o. female who presents for CAD, a fib She has a history of atrial fib - she has elected to be managed with rate control and Xarelto. Other issues include HTN, &breast cancer. atrial fibrillation, coronary artery disease, hypertension, breast cancer, mitral regurgitation.  She was recently admitted 9/19-9/26 and underwent coronary artery bypass grafting (L-LAD), mitral valve repair and Maze procedure.  She was placed on prophylactic Amiodarone.  She was placed on Warfarin for anticoagulation post operatively.  She required Metoprolol tartrate and digoxin to control her HR. She remained in AFlutter at the time of DC.  She was DC to SNF.    Seen here in early July by myself - HR was not controlled. Her lanoxin was added back to get better rate control.[name] then saw her a few weeks later - she was doing well with improvement in her HR. She had had her echo updated - this showed worsening MR - she has subsequently had TEE and cath and has plans for surgical intervention.   Here today after surgery on 08/10/18, second post op visit.   Coumadin is doing well with no bleeding.  She would like to come off.  Dr. Roxy Manns would like her on it for 3 months, for MV Repair with ring.  Then we would change to Xarelto.  She does not mind staying on anticoagulation just not coumadin.  She has had 2 episodes of dizziness several weeks apart.  Usually when standing.  Her lasix is every other day.  Her appetite is decreased on amiodarone.  HR is controlled.  Denies any bleeding. No awareness of rapid HR    She remains in a flutter.     Past Medical History:  Diagnosis Date  . Atypical atrial flutter ([place]) 08/14/2018   Post-operative  . Breast cancer of upper-outer quadrant of left female breast ([place]) 02/02/2016  . CAD (coronary artery disease) 06/20/2018   LHC 7/19: pLAD 65/90, oD1 90, mLCx 85, OM2 50, oRCA 30, EF 50-55 >> s/p CABG in 9/19 (L-LAD)  . Colon polyp 02/2012  . Dupuytren contracture    bilateral hands  . Family history of breast cancer   . Hypertension   . Mitral regurgitation 11/07/2013   Echo 7/19: Mild LVH, EF 60-65, no RWMA, mod ot severe MR, massive LAE, PASP 33 // TEE 7/19:  Mild conc LVH, EF 60-65, no RWMA, severe MR with mild post leaflet prolapse, massive // s/p MV repair 07/2018  . On continuous oral anticoagulation 08/22/2015   Started on Xarelto 08/12/2015   . Osteopenia   . Persistent atrial fibrillation 08/22/2015   Started late August or early September 2016 // s/p Maze procedure 07/2018  . Radiation Therapy 04/21/16-05/19/16   left breast 47.72 Gy, boosted to 10 Gy  . 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 S/P Maze operation for atrial fibrillation 08/10/2018   Complete bilateral atrial lesion set using bipolar radiofrequency and cryothermy ablation with clipping of LA appendage  . S/P MVR (mitral valve repair) 08/10/2018   Complex valvuloplasty including Gore-tex neochord placement x8, Plication of Lateral Commissure and 61m Sorin Memo 4D Ring Annuloplasty SN#  [id number]  . Ulcerative colitis ([place])   . Wears glasses     Past Surgical History:  Procedure Laterality Date  . BREAST BIOPSY Left 01/28/2016  . BREAST LUMPECTOMY Left 02/23/2016  . BREAST LUMPECTOMY WITH RADIOACTIVE SEED LOCALIZATION Left 02/23/2016   Procedure: BREAST LUMPECTOMY WITH RADIOACTIVE SEED LOCALIZATION;  Surgeon: [name], MD;  Location: [facility];  Service: General;  Laterality: Left;  . CARDIOVERSION N/A 10/02/2015   Procedure: CARDIOVERSION;  Surgeon: Jerline Pain, MD;  Location: [place];  Service: Cardiovascular;  Laterality: N/A;  . CLIPPING OF ATRIAL APPENDAGE  08/10/2018     Procedure: CLIPPING OF ATRIAL APPENDAGE;  Surgeon: Rexene Alberts, MD;  Location: [facility];  Service: Open Heart Surgery;;  . COLONOSCOPY    . CORONARY ARTERY BYPASS GRAFT N/A 08/10/2018   Procedure: CORONARY ARTERY BYPASS GRAFTING (CABG) x 1, LIMA-LAD,  USING LEFT INTERNAL MAMMARY ARTERY. HARVESTED RIGHT GREATER SAPHENOUS VEIN ENDOSCOPICALLY;  Surgeon: Rexene Alberts, MD;  Location: [facility];  Service: Open Heart Surgery;  Laterality: N/A;  . DUPUYTREN CONTRACTURE RELEASE  2001   leftx2  . [place]   right  . DUPUYTREN CONTRACTURE RELEASE Right 05/02/2014   Procedure: EXCISION DUPUYTRENS RIGHT PALMAR/SMALL ;  Surgeon: Cammie Sickle, MD;  Location: [facility];  Service: Orthopedics;  Laterality: Right;  [name] MAZE N/A 08/10/2018   Procedure: MAZE;  Surgeon: Rexene Alberts, MD;  Location: [facility];  Service: Open Heart Surgery;  Laterality: N/A;  . MITRAL VALVE REPAIR N/A 08/10/2018   Procedure: MITRAL VALVE REPAIR (MVR);  Surgeon: Rexene Alberts, MD;  Location: [facility];  Service: Open Heart Surgery;  Laterality: N/A;  glutaraldehyde  . RIGHT/LEFT HEART CATH AND CORONARY ANGIOGRAPHY N/A 06/20/2018   Procedure: RIGHT/LEFT HEART CATH AND CORONARY ANGIOGRAPHY;  Surgeon: [name], MD;  Location: [facility];  Service: Cardiovascular;  Laterality: N/A;  . TEE WITHOUT CARDIOVERSION N/A 06/20/2018   Procedure: TRANSESOPHAGEAL ECHOCARDIOGRAM (TEE);  Surgeon: Pixie Casino, MD;  Location: [facility];  Service: Cardiovascular;  Laterality: N/A;  . TEE WITHOUT CARDIOVERSION N/A 08/10/2018   Procedure: TRANSESOPHAGEAL ECHOCARDIOGRAM (TEE);  Surgeon: Rexene Alberts, MD;  Location: [facility];  Service: Open Heart Surgery;  Laterality: N/A;  . TONSILLECTOMY  age 32     Current Outpatient Medications  Medication Sig Dispense Refill  . acetaminophen (TYLENOL) 325 MG tablet Take 2 tablets (650 mg total) by mouth every 6 (six) hours as needed for mild pain or  fever.    [name] allopurinol (ZYLOPRIM) 100 MG tablet Take 200 mg by mouth daily.     [name] amiodarone (PACERONE) 200 MG tablet Take 1 tablet (200 mg total) by mouth daily. 30 tablet 11  . aspirin EC 81 MG EC tablet Take 1 tablet (81 mg total) by mouth daily.    . digoxin 62.5 MCG TABS Take 0.0625 mg by mouth daily.    . furosemide (LASIX) 20 MG tablet Take 20 mg by mouth.    [name] LIALDA 1.2 G EC tablet Take 4.8 g by mouth daily.     . metoprolol tartrate (LOPRESSOR) 100 MG tablet Take 1 tablet (100 mg total) by mouth 2 (two) times daily.    . potassium chloride SA (K-DUR,KLOR-CON) 20 MEQ tablet Take 1 tablet (20 mEq total) by mouth daily. 90 tablet 3  . simvastatin (ZOCOR) 20 MG tablet Take 20 mg by mouth daily.     . traMADol (  ULTRAM) 50 MG tablet Take 1 tablet (50 mg total) by mouth every 4 (four) hours as needed for moderate pain. 28 tablet 0  . warfarin (COUMADIN) 1 MG tablet Take 1 tablet (1 mg total) by mouth daily at 6 PM. (Patient taking differently: Take 1 mg by mouth daily at 6 PM. AS DIRECTED)     No current facility-administered medications for this visit.     Allergies:   Shellfish allergy    Social History:  The patient  reports that she quit smoking about 25 years ago. Her smoking use included cigarettes. She has a 20.00 pack-year smoking history. She has never used smokeless tobacco. She reports that she drinks about 3.0 standard drinks of alcohol per week. She reports that she does not use drugs.   Family History:  The patient's family history includes Breast cancer in her sister; Breast cancer (age of onset: 75) in her mother; Cirrhosis in her mother; Heart attack in her father; Heart failure in her father; Kidney Stones in her sister.    ROS:  General:no colds or fevers, no weight changes Skin:no rashes or ulcers HEENT:no blurred vision, no congestion CV:see HPI--chest wall pain and ecchymosis PUL:see HPI GI:no diarrhea constipation or melena, no indigestion GU:no hematuria, no  dysuria MS:no joint pain, no claudication Neuro:no syncope, + lightheadedness Endo:no diabetes, no thyroid disease  Wt Readings from Last 3 Encounters:  09/12/18 120 lb 6.4 oz (54.6 kg)  09/11/18 117 lb (53.1 kg)  08/24/18 121 lb (54.9 kg)     PHYSICAL EXAM: VS:  BP (!) 100/58   Pulse 88   Ht 5' 4"  (1.626 m)   Wt 120 lb 6.4 oz (54.6 kg)   BMI 20.67 kg/m  , BMI Body mass index is 20.67 kg/m. General:Pleasant affect, NAD Skin:Warm and dry, brisk capillary refill HEENT:normocephalic, sclera clear, mucus membranes moist Neck:supple, no JVD, no bruits  Heart:irreg irreg without murmur, gallup, rub or click chest wall with ecchymosis Lungs:clear without rales, rhonchi, or wheezes YQM:GNOI, non tender, + BS, do not palpate liver spleen or masses Ext:tr lower ext edema, 2+ pedal pulses, 2+ radial pulses Neuro:alert and oriented X 3, MAE, follows commands, + facial symmetry    EKG:  EKG is ordered today. The ekg ordered today demonstrates a flutter with Deep T wave inversion V3-V6. old   Recent Labs: 05/30/2018: NT-Pro BNP 5,122; TSH 1.200 08/07/2018: ALT 14 08/14/2018: Hemoglobin 9.1; Platelets 87 08/16/2018: Magnesium 1.9 08/22/2018: BUN 14; Creatinine, Ser 0.99; Potassium 4.1; Sodium 141    Lipid Panel No results found for: CHOL, TRIG, HDL, CHOLHDL, VLDL, LDLCALC, LDLDIRECT     Other studies Reviewed: Additional studies/ records that were reviewed today include: . TEE 06/20/18 Study Conclusions  - Left ventricle: The cavity size was normal. There was mild   concentric hypertrophy. Systolic function was normal. The   estimated ejection fraction was in the range of 60% to 65%. Wall   motion was normal; there were no regional wall motion   abnormalities. - Aorta: Mild atheromatous disease. - Mitral valve: Severe regurgitation. Degenerate leafelts with mild   posterior leaflet prolapse - no annular dilitation. Effective   regurgitant orifice (PISA): 0.82 cm^2. Regurgitant  volume (PISA):   113 ml. - Left atrium: Massively dilated. - Pulmonary veins: No anomaly. Reversal of flow noted in the LUPV. - Right atrium: No evidence of thrombus in the atrial cavity or   appendage. - Atrial septum: No defect or patent foramen ovale was identified.  Impressions:  -  Severe mitral regurgitation. No LAA thrombus. Massive LAE. LVEF   60-65%.  Echo 06/08/18 Study Conclusions  - Left ventricle: The cavity size was normal. Wall thickness was   increased in a pattern of mild LVH. Systolic function was normal.   The estimated ejection fraction was in the range of 60% to 65%.   Wall motion was normal; there were no regional wall motion   abnormalities. - Mitral valve: Mildly to moderately calcified annulus. Mildly   thickened, mildly calcified leaflets . There was moderate to   severe regurgitation. - Left atrium: The atrium was massively dilated. - Pulmonary arteries: Systolic pressure was mildly increased. PA   peak pressure: 33 mm Hg (S).  Cardiac cath 06/20/18  Severe mitral regurgitation with marked enlargement of the left atrium.  Mild pulmonary hypertension with mean pulmonary arterial pressure 28 mmHg.  Phasic pressure 45/16 mmHg  Three-vessel coronary calcification with significant greater than 80% distal circumflex, 90% first diagonal ostium, and 80 to 90% mid LAD.  Normal LV systolic function with EF 50 to 55%.  LVEDP 11 mmHg  Cardiac output 4.3 L/min/cardiac index 2.7 L/min/m  RECOMMENDATIONS:   Referred to Dr. Remus Loffler for consideration of surgical mitral valve repair left atrial appendage ligation, consideration of Maze procedure, and coronary revascularization.   OP Note 08/10/18  MITRAL VALVE REPAIR (MVR)  -Complex Valvular Annuloplasty -28 mm Sorin Memo 4D Ring -Placement of 8 Gore-tex Neochords -Debridement of Annular Calcification -Plication of Anterior Leaflet P3  CORONARY ARTERY BYPASS GRAFTING x 1 -Left Internal Mammary  Artery to Left Anterior Descending Artery  ENDOSCOPIC HARVEST GREATER SAPHENOUS VEIN -Right Thigh  MAZE  -Complete Bi-Atrial Lesion Set using Radiofrequency Ablation and Cryothermy -Clipping of Left Atrial Appendage with 45 mm Atricure Pro2Clip  ASSESSMENT AND PLAN:  1.  Atrial flutter rate controlled, continue DIG and amiodarone and BB.  On coumadin for now secondary to MV repair.  Once INR therapeutic for 4 weeks will plan DCCV discussed with [name].  Will have her come back in 3 weeks to arrange and check EKG.  This gives her heart time to heal.    2.  S/p MVR, CABG X 1 and MAZE procedure, healing well, now able to drive.  Will check echo post cardioversion.  3.  Anticoagulation on coumadin with INR followed by our office.  In 3 months can stop Coumadin per Dr. Roxy Manns and will change to Xarelto.    4.  HTN, controlled to lower end, mild orthostatic hypotension and lasix is 20 mg every other day.if continues with dizziness decrease lasix to twice a week.     5,  HLD continue statin.       Current medicines are reviewed with the patient today.  The patient Has no concerns regarding medicines.  The following changes have been made:  See above Labs/ tests ordered today include:see above  Disposition:   FU:  see above  Signed, Cecilie Kicks, NP  09/12/2018 10:04 AM    [facility], [place], [address] [place], [place] Phone: [phone]; Fax: [phone]

## 2018-09-11 NOTE — Patient Instructions (Addendum)
Continue all previous medications without any changes at this time  Continue to avoid any heavy lifting or strenuous use of your arms or shoulders for at least a total of three months from the time of surgery.  After three months you may gradually increase how much you lift or otherwise use your arms or chest as tolerated, with limits based upon whether or not activities lead to the return of significant discomfort.  You are encouraged to enroll and participate in the outpatient cardiac rehab program beginning as soon as practical.  You may return to driving an automobile as long as you are no longer requiring oral narcotic pain relievers during the daytime.  It would be wise to start driving only short distances during the daylight and gradually increase from there as you feel comfortable.

## 2018-09-12 ENCOUNTER — Ambulatory Visit (INDEPENDENT_AMBULATORY_CARE_PROVIDER_SITE_OTHER): Payer: Medicare Other

## 2018-09-12 ENCOUNTER — Telehealth: Payer: Self-pay

## 2018-09-12 ENCOUNTER — Ambulatory Visit: Payer: Medicare Other | Admitting: Cardiology

## 2018-09-12 ENCOUNTER — Encounter: Payer: Self-pay | Admitting: Cardiology

## 2018-09-12 VITALS — BP 100/58 | HR 88 | Ht 64.0 in | Wt 120.4 lb

## 2018-09-12 DIAGNOSIS — Z9889 Other specified postprocedural states: Secondary | ICD-10-CM

## 2018-09-12 DIAGNOSIS — I484 Atypical atrial flutter: Secondary | ICD-10-CM

## 2018-09-12 DIAGNOSIS — Z7901 Long term (current) use of anticoagulants: Secondary | ICD-10-CM | POA: Diagnosis not present

## 2018-09-12 DIAGNOSIS — I340 Nonrheumatic mitral (valve) insufficiency: Secondary | ICD-10-CM

## 2018-09-12 DIAGNOSIS — I10 Essential (primary) hypertension: Secondary | ICD-10-CM

## 2018-09-12 DIAGNOSIS — I4819 Other persistent atrial fibrillation: Secondary | ICD-10-CM

## 2018-09-12 DIAGNOSIS — I2510 Atherosclerotic heart disease of native coronary artery without angina pectoris: Secondary | ICD-10-CM

## 2018-09-12 DIAGNOSIS — N3000 Acute cystitis without hematuria: Secondary | ICD-10-CM

## 2018-09-12 LAB — URINALYSIS
Bilirubin Urine: NEGATIVE
Glucose, UA: NEGATIVE
NITRITE: NEGATIVE
Specific Gravity, Urine: 1.021 (ref 1.001–1.03)

## 2018-09-12 LAB — POCT INR: INR: 2.5 (ref 2.0–3.0)

## 2018-09-12 MED ORDER — CIPROFLOXACIN HCL 500 MG PO TABS: 500.0000 mg | ORAL_TABLET | Freq: Two times a day (BID) | ORAL | 0 refills | Status: DC

## 2018-09-12 NOTE — Patient Instructions (Signed)
Medication Instructions:  Your physician recommends that you continue on your current medications as directed. Please refer to the Current Medication list given to you today.  If you need a refill on your cardiac medications before your next appointment, please call your pharmacy.   Lab work: None  If you have labs (blood work) drawn today and your tests are completely normal, you will receive your results only by: [name] MyChart Message (if you have MyChart) OR . A paper copy in the mail If you have any lab test that is abnormal or we need to change your treatment, we will call you to review the results.  Testing/Procedures: None  Follow-Up: You are scheduled to see Cecilie Kicks ,NP on 09/29/18 @ 10:30 AM  Any Other Special Instructions Will Be Listed Below (If Applicable).

## 2018-09-12 NOTE — Telephone Encounter (Signed)
prescription for Cipro 500 bid x3 days for likely UTI  We will need to f/u results of culture in 2-3 days, RX sent to [facility]

## 2018-09-12 NOTE — Patient Instructions (Signed)
Please continue same dosage 92m daily.  Recheck in 1 week w/ home health. Call TJonelle Sidle [facility] RN [phone] redraw orders for next check, ok to leave message on machine with orders if she does not answer.  [facility] #[phone]#[phone]

## 2018-09-12 NOTE — Telephone Encounter (Signed)
Received a letter from [facility] re: the potential risk of taking Tamoxifen and Amiodarone... Called the pt and she reports that she is not taking it that it was d/c'd by Dr. Burr Medico her Oncologist. She will be seeing her back in 11/19 and will let us know if she decides the benefit outweighs the risk and we can talk with Dr.Smith. Pt verbalized understanding and agrees.

## 2018-09-12 NOTE — Addendum Note (Signed)
Addended by: Charlena Cross F on: 09/12/2018 03:05 PM   Modules accepted: Orders

## 2018-09-14 NOTE — Telephone Encounter (Signed)
Called patient to see if she was interested in participating in the Cardiac Rehab Program. Patient stated yes. Patient will come in for orientation on 11/21/18 @ 1:30PM and will attend the 1:15PM exercise class.  Mailed homework package.  Went over insurance, patient verbalized understanding.

## 2018-09-17 ENCOUNTER — Other Ambulatory Visit: Payer: Self-pay | Admitting: Nurse Practitioner

## 2018-09-19 ENCOUNTER — Other Ambulatory Visit: Payer: Self-pay

## 2018-09-19 ENCOUNTER — Ambulatory Visit (INDEPENDENT_AMBULATORY_CARE_PROVIDER_SITE_OTHER): Payer: Medicare Other | Admitting: Cardiology

## 2018-09-19 DIAGNOSIS — Z7901 Long term (current) use of anticoagulants: Secondary | ICD-10-CM

## 2018-09-19 DIAGNOSIS — I340 Nonrheumatic mitral (valve) insufficiency: Secondary | ICD-10-CM | POA: Diagnosis not present

## 2018-09-19 DIAGNOSIS — Z9889 Other specified postprocedural states: Secondary | ICD-10-CM

## 2018-09-19 DIAGNOSIS — I4819 Other persistent atrial fibrillation: Secondary | ICD-10-CM

## 2018-09-19 DIAGNOSIS — I484 Atypical atrial flutter: Secondary | ICD-10-CM

## 2018-09-19 LAB — POCT INR: INR: 3.6 — AB (ref 2.0–3.0)

## 2018-09-19 MED ORDER — WARFARIN SODIUM 1 MG PO TABS: 1.0000 mg | ORAL_TABLET | Freq: Every day | ORAL | 3 refills | Status: DC

## 2018-09-19 MED ORDER — TAMOXIFEN CITRATE 20 MG PO TABS: 20.0000 mg | ORAL_TABLET | Freq: Every day | ORAL | 1 refills | Status: DC

## 2018-09-22 ENCOUNTER — Telehealth: Payer: Self-pay

## 2018-09-22 NOTE — Telephone Encounter (Signed)
Tiffany, nurse with [facility] [phone] contacted the office to get urine culture results.  Advised that culture was not done, only urinalysis.  Patient is still having symptoms of UTI.  Advised nurse to let patient know that she needed to contact her PCP in regards to getting future medications for improvement of symptoms.  She acknowledged receipt.

## 2018-09-25 NOTE — Progress Notes (Signed)
[place]  [phone]) [phone] [phone]  Clinic Follow Up Note   Patient Care Team: [name], MD as PCP - General (Family Medicine) [name], MD as PCP - Cardiology (Cardiology) 09/27/2018   CHIEF COMPLAINTS:  Follow up  left breast cancer  Oncology History   Breast cancer of upper-outer quadrant of left female breast [facility])   Staging form: Breast, AJCC 7th Edition   - Clinical stage from 01/28/2016: Stage IA (T1a, N0, M0) - Signed by Truitt Merle, MD on 02/03/2016   - Pathologic stage from 02/23/2016: Stage IA (T1b, N0, cM0) - Signed by Truitt Merle, MD on 06/14/2016        Breast cancer of upper-outer quadrant of left female breast ([place])   01/14/2016 Mammogram    Screening mammogram showed possible mass with distortion in the left breast.    01/23/2016 Imaging    Diagnostic mammogram and ultrasound of the left breast showed a 3-4 mm shadowing mass at 1:00 in the left breast, no adenopathy.    01/28/2016 Initial Diagnosis    Breast cancer of upper-outer quadrant of left female breast ([place])    01/28/2016 Initial Biopsy    Left breast mass biopsy showed invasive ductal carcinoma, grade 1.    01/28/2016 Receptors her2    Your 100% positive, PR 100% positive, HER-2 negative, Ki-67 5%    02/23/2016 Surgery    Left breast lumpectomy, and sentinel lymph node biopsy    02/23/2016 Pathology Results    Left breast lumpectomy showed a 1.0 cm invasive ductal carcinoma, grade 1, no lymphvascular invasion, invasive carcinoma is broadly less than 0.1 cm to the anterior inferior margin, ADH, ALH, 1 node(-). Left inferior reexcision margin showed LCIS     04/21/2016 - 05/19/2016 Radiation Therapy    adjuvant breast radiation     06/10/2016 -  Anti-estrogen oral therapy    Anastrozole 1 mg daily, changed to  Tamoxifen due to osteoporosis on 09/30/17      02/03/2017 Mammogram    Bilateral diagnostic mammogram 02/03/17 IMPRESSION: Expected surgical changes in the  upper-outer quadrant of the left breast. No mammographic evidence of malignancy in the bilateral breasts.    02/20/2018 Mammogram    02/20/2018 Mammogram IMPRESSION: No evidence of malignancy within either breast. Stable postsurgical changes within the left breast.      HISTORY OF PRESENTING ILLNESS:  Michele Green 77 y.o. female is here because of newly diagnosed left breast cancer. She is accompanied by her husband to our multidisciplinary rest clinic today.  This was discovered by screening mammo, she denies any palpable breast mass, skin change or nipple discharge, she feels well, no complains. She remains to be physically active.   She  was found to have AF and had cardioverstion in 09/2015,  she has been on amiodarone and Xarelto since then. She also has had colitis 2-3 yeears, on liada,  she follows up with gastroenterologist Dr. Amedeo Plenty.  GYN HISTORY  Menarchal: 12 LMP: 48 Contraceptive: 8 HRT: 5 years, stopped when she was 27 G1P1: no breast feeding      CURRENTLY THERAPY:  1. Anastrozole 80m daily, started in 05/2016, changed to Tamoxifen due to osteoporosis on 09/30/17, which was held in Sep 2019 for her open heart surgery  2. Prolia injections every 6 months with Dr. sBrigitte Pulse INTERIM HISTORY:  Michele TVEDTreturns for follow-up. She is noted to have had open heart surgery, including one vessel CABG, mitral valve repair, and Maze  operation for AF with Dr. Roxy Manns on 08/10/2018. She tolerated well.   Today, she is here alone. She is recovering well from her recent surgery. She was off tamoxifen for her surgery. She is now on coumadin, and will stay on it for 2 more months. She is capable of performing daily activities nicely. She will also start an exercise program. She experienced hot flashes during day and night, but also has cold sensitivity. She sleeps well at night.    MEDICAL HISTORY:  Past Medical History:  Diagnosis Date  . Atypical atrial flutter ([place]) 08/14/2018    Post-operative  . Breast cancer of upper-outer quadrant of left female breast ([place]) 02/02/2016  . CAD (coronary artery disease) 06/20/2018   LHC 7/19: pLAD 65/90, oD1 90, mLCx 85, OM2 50, oRCA 30, EF 50-55 >> s/p CABG in 9/19 (L-LAD)  . Colon polyp 02/2012  . Dupuytren contracture    bilateral hands  . Family history of breast cancer   . Hypertension   . Mitral regurgitation 11/07/2013   Echo 7/19: Mild LVH, EF 60-65, no RWMA, mod ot severe MR, massive LAE, PASP 33 // TEE 7/19:  Mild conc LVH, EF 60-65, no RWMA, severe MR with mild post leaflet prolapse, massive // s/p MV repair 07/2018  . On continuous oral anticoagulation 08/22/2015   Started on Xarelto 08/12/2015   . Osteopenia   . Persistent atrial fibrillation 08/22/2015   Started late August or early September 2016 // s/p Maze procedure 07/2018  . Radiation Therapy 04/21/16-05/19/16   left breast 47.72 Gy, boosted to 10 Gy  . 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 S/P Maze operation for atrial fibrillation 08/10/2018   Complete bilateral atrial lesion set using bipolar radiofrequency and cryothermy ablation with clipping of LA appendage  . S/P MVR (mitral valve repair) 08/10/2018   Complex valvuloplasty including Gore-tex neochord placement x8, Plication of Lateral Commissure and 69m Sorin Memo 4D Ring Annuloplasty SN# [id number] . Ulcerative colitis ([place]   . Wears glasses     SURGICAL HISTORY: Past Surgical History:  Procedure Laterality Date  . BREAST BIOPSY Left 01/28/2016  . BREAST LUMPECTOMY Left 02/23/2016  . BREAST LUMPECTOMY WITH RADIOACTIVE SEED LOCALIZATION Left 02/23/2016   Procedure: BREAST LUMPECTOMY WITH RADIOACTIVE SEED LOCALIZATION;  Surgeon: BExcell Seltzer MD;  [place]  Service: General;  Laterality: Left;  . CARDIOVERSION N/A 10/02/2015   Procedure: CARDIOVERSION;  Surgeon: MJerline Pain MD;  [place]  Service: Cardiovascular;  Laterality: N/A;  . CLIPPING OF ATRIAL  APPENDAGE  08/10/2018   Procedure: CLIPPING OF ATRIAL APPENDAGE;  Surgeon: ORexene Alberts MD;  [place]  Service: Open Heart Surgery;;  . COLONOSCOPY    . CORONARY ARTERY BYPASS GRAFT N/A 08/10/2018   Procedure: CORONARY ARTERY BYPASS GRAFTING (CABG) x 1, LIMA-LAD,  USING LEFT INTERNAL MAMMARY ARTERY. HARVESTED RIGHT GREATER SAPHENOUS VEIN ENDOSCOPICALLY;  Surgeon: ORexene Alberts MD;  [place]  Service: Open Heart Surgery;  Laterality: N/A;  . DUPUYTREN CONTRACTURE RELEASE  2001   leftx2  . [place]  [place]  . DUPUYTREN CONTRACTURE RELEASE Right 05/02/2014   Procedure: EXCISION DUPUYTRENS RIGHT PALMAR/SMALL ;  Surgeon: RCammie Sickle MD;  [place]  Service: Orthopedics;  Laterality: Right;  .Marland KitchenMAZE N/A 08/10/2018   Procedure: MAZE;  Surgeon: ORexene Alberts MD;  [place]  Service: Open Heart Surgery;  Laterality: N/A;  . MITRAL VALVE REPAIR N/A 08/10/2018   Procedure: MITRAL VALVE REPAIR (MVR);  Surgeon: Rexene Alberts, MD;  Location: [place];  Service: Open Heart Surgery;  Laterality: N/A;  glutaraldehyde  . RIGHT/LEFT HEART CATH AND CORONARY ANGIOGRAPHY N/A 06/20/2018   Procedure: RIGHT/LEFT HEART CATH AND CORONARY ANGIOGRAPHY;  Surgeon: [name], MD;  Location: [place] CV LAB;  Service: Cardiovascular;  Laterality: N/A;  . TEE WITHOUT CARDIOVERSION N/A 06/20/2018   Procedure: TRANSESOPHAGEAL ECHOCARDIOGRAM (TEE);  Surgeon: Pixie Casino, MD;  [facility] END[facility]COPY;  Service: Cardiovascular;  Laterality: N/A;  . TEE WITHOUT CARDIOVERSION N/A 08/10/2018   Procedure: TRANSESOPHAGEAL ECHOCARDIOGRAM (TEE);  Surgeon: Rexene Alberts, MD;  Location: [place];  Service: Open Heart Surgery;  Laterality: N/A;  . TONSILLECTOMY  age 49    SOCIAL HISTORY: Social History   Socioeconomic History  . Marital status: Widowed    Spouse name: Not on file  . Number of children: 1  . Years of education: Not on  file  . Highest education level: Not on file  Occupational History  . Not on file  Social Needs  . Financial resource strain: Not on file  . Food insecurity:    Worry: Not on file    Inability: Not on file  . Transportation needs:    Medical: Not on file    Non-medical: Not on file  Tobacco Use  . Smoking status: Former Smoker    Packs/day: 1.00    Years: 20.00    Pack years: 20.00    Types: Cigarettes    Last attempt to quit: 11/22/1992    Years since quitting: 25.8  . Smokeless tobacco: Never Used  Substance and Sexual Activity  . Alcohol use: Yes    Alcohol/week: 3.0 standard drinks    Types: 3 Standard drinks or equivalent per week    Comment: social  . Drug use: No  . Sexual activity: Never    Partners: Male    Birth control/protection: Post-menopausal  Lifestyle  . Physical activity:    Days per week: Not on file    Minutes per session: Not on file  . Stress: Not on file  Relationships  . Social connections:    Talks on phone: Not on file    Gets together: Not on file    Attends religious service: Not on file    Active member of club or organization: Not on file    Attends meetings of clubs or organizations: Not on file    Relationship status: Not on file  . Intimate partner violence:    Fear of current or ex partner: Not on file    Emotionally abused: Not on file    Physically abused: Not on file    Forced sexual activity: Not on file  Other Topics Concern  . Not on file  Social History Narrative  . Not on file    FAMILY HISTORY: Family History  Problem Relation Age of Onset  . Cirrhosis Mother   . Breast cancer Mother 76  . Heart failure Father   . Heart attack Father   . Breast cancer Sister        early 65's  . Kidney Stones Sister        loss of kidney due to stones  . Osteoporosis Neg Hx     ALLERGIES:  is allergic to shellfish allergy.  MEDICATIONS:  Current Outpatient Medications  Medication Sig Dispense Refill  . acetaminophen  (TYLENOL)  325 MG tablet Take 2 tablets (650 mg total) by mouth every 6 (six) hours as needed for mild pain or fever.    [name] allopurinol (ZYLOPRIM) 100 MG tablet Take 200 mg by mouth daily.     [name] amiodarone (PACERONE) 200 MG tablet Take 1 tablet (200 mg total) by mouth daily. 30 tablet 11  . aspirin EC 81 MG EC tablet Take 1 tablet (81 mg total) by mouth daily.    . ciprofloxacin (CIPRO) 500 MG tablet Take 1 tablet (500 mg total) by mouth 2 (two) times daily. 6 tablet 0  . digoxin 62.5 MCG TABS Take 0.0625 mg by mouth daily.    . furosemide (LASIX) 20 MG tablet Take 20 mg by mouth.    [name] LIALDA 1.2 G EC tablet Take 4.8 g by mouth daily.     . metoprolol tartrate (LOPRESSOR) 100 MG tablet Take 1 tablet (100 mg total) by mouth 2 (two) times daily.    . potassium chloride SA (K-DUR,KLOR-CON) 20 MEQ tablet Take 1 tablet (20 mEq total) by mouth daily. 90 tablet 3  . simvastatin (ZOCOR) 20 MG tablet Take 20 mg by mouth daily.     . tamoxifen (NOLVADEX) 20 MG tablet Take 1 tablet (20 mg total) by mouth daily. 90 tablet 1  . traMADol (ULTRAM) 50 MG tablet Take 1 tablet (50 mg total) by mouth every 4 (four) hours as needed for moderate pain. 28 tablet 0  . warfarin (COUMADIN) 1 MG tablet Take 1 tablet (1 mg total) by mouth daily at 6 PM. 30 tablet 3   No current facility-administered medications for this visit.     REVIEW OF SYSTEMS: Constitutional: Denies fevers, chills or abnormal night sweats (+) hot flashes (+) cold sensitivity  Eyes: Denies blurriness of vision, double vision or watery eyes Ears, nose, mouth, throat, and face: Denies mucositis or sore throat Respiratory: Denies cough, dyspnea or wheezes Cardiovascular: Denies palpitation, chest discomfort or lower extremity swelling (+) recent heart surgery  Gastrointestinal:  Denies nausea, heartburn or change in bowel habits Skin: Denies abnormal skin rashes Lymphatics: Denies new lymphadenopathy or easy bruising Neurological:Denies numbness,  tingling or new weaknesses Musculoskeletal: negative  Behavioral/Psych: Mood is stable, no new changes  All other systems were reviewed with the patient and are negative.  PHYSICAL EXAMINATION: ECOG PERFORMANCE STATUS:1  Vitals:   09/27/18 1351  BP: (!) 124/91  Pulse: (!) 52  Resp: 18  Temp: 98.1 F (36.7 C)  SpO2: 96%   Filed Weights   09/27/18 1351  Weight: 120 lb 12.8 oz (54.8 kg)    GENERAL:alert, no distress and comfortable, she uses a walker  SKIN: skin color, texture, turgor are normal, no rashes or significant lesions EYES: normal, conjunctiva are pink and non-injected, sclera clear OROPHARYNX:no exudate, no erythema and lips, buccal mucosa, and tongue normal  NECK: supple, thyroid normal size, non-tender, without nodularity LYMPH:  no palpable lymphadenopathy in the cervical, axillary or inguinal LUNGS: clear to auscultation and percussion with normal breathing effort HEART: regular rate & rhythm and no murmurs and no lower extremity edema (+) midline surgical incision healing well, but is tender ABDOMEN:abdomen soft, non-tender and normal bowel sounds Musculoskeletal:no cyanosis of digits and no clubbing.  PSYCH: alert & oriented x 3 with fluent speech NEURO: no focal motor/sensory deficits Breast: surgical scar in Left upper outer quadrant of breast has healed well. No palpable mass or adenopathy in either breast or axilla.   LABORATORY DATA:  CBC Latest Ref Rng &  Units 09/27/2018 08/14/2018 08/13/2018  WBC 4.0 - 10.5 K/uL 4.7 7.1 7.9  Hemoglobin 12.0 - 15.0 g/dL 11.4(L) 9.1(L) 10.0(L)  Hematocrit 36.0 - 46.0 % 35.7(L) 28.2(L) 30.3(L)  Platelets 150 - 400 K/uL 169 87(L) 87(L)   CMP Latest Ref Rng & Units 09/27/2018 08/22/2018 08/16/2018  Glucose 70 - 99 mg/dL 83 89 105(H)  BUN 8 - 23 mg/dL 14 14 10   Creatinine 0.44 - 1.00 mg/dL 0.87 0.99 0.68  Sodium 135 - 145 mmol/L 143 141 137  Potassium 3.5 - 5.1 mmol/L 4.4 4.1 3.5  Chloride 98 - 111 mmol/L 107 99 102  CO2 22  - 32 mmol/L 26 25 27   Calcium 8.9 - 10.3 mg/dL 8.9 8.9 7.1(L)  Total Protein 6.5 - 8.1 g/dL 6.8 - -  Total Bilirubin 0.3 - 1.2 mg/dL 0.5 - -  Alkaline Phos 38 - 126 U/L 73 - -  AST 15 - 41 U/L 24 - -  ALT 0 - 44 U/L 14 - -    Diagnosis 02/23/2016 1. Breast, lumpectomy, left - INVASIVE DUCTAL CARCINOMA, GRADE I/III, SPANNING 1.0 CM. - INVASIVE CARCINOMA IS BROADLY LESS THAN 0.1 CM TO THE ANTERIOR/INFERIOR MARGIN OF SPECIMEN #1. - ATYPICAL DUCTAL HYPERPLASIA. - LOBULAR NEOPLASIA (ATYPICAL LOBULAR HYPERPLASIA) - ONE BENIGN LYMPH NODE (0/1). - SEE ONCOLOGY TABLE BELOW. 2. Breast, excision, left inferior - LOBULAR NEOPLASIA (LOBULAR CARCINOMA IN SITU). - SEE COMMENT. 1. 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 BREAST, INVASIVE TUMOR, WITH LYMPH NODES PRESENT Specimen, including laterality and lymph node sampling (sentinel, non-sentinel): Left breast. Procedure: Seed localized lumpectomy and additional inferior margin resection. Histologic type: Ductal. Grade: 1 Tubule formation: 1 Nuclear pleomorphism: 2 Mitotic:1 Tumor size (gross measurement): 1.0 cm Margins: Invasive, distance to closest margin: Broadly less than 0.1 cm to the anterior/inferior margin of specimen #1, see comment. Lymphovascular invasion: Not identified. Ductal carcinoma in situ: Not identified. Lobular neoplasia: Present, lobular carcinoma in situ. Tumor focality: Unifocal. Treatment effect: N/A Extent of tumor: Confined to breast parenchyma. Lymph nodes: Examined: 0 Sentinel,  1 Non-sentinel 1 Total Lymph nodes with metastasis: 0 Breast prognostic profile: Case [id number] Estrogen receptor: 100%, strong staining intensity Progesterone receptor: 100%, strong staining intensity Her 2 neu: No amplification was detected. The ratio was 1.35. Her 2 neu by FISH will be repeated on the current case (block 1A) and the results reported separately. Ki-67: 5% Non-neoplastic breast: Fibrocystic  changes with calcifications and healing biopsy site TNM: pT1b, pN0 Comments: Immunohistochemical stains for smooth muscle myosin, calponin, and p63 support the above diagnosis. Although the invasive carcinoma is broadly less than 0.1 cm to the anterior/inferior margin of specimen #1, the separately submitted inferior margin (specimen #2) is negative. 2. The surgical resection margin(s) of the specimen were inked and microscopically evaluated. (JBK:ecj 02/27/2016) Results: HER2 - NEGATIVE RATIO OF HER2/CEP17 SIGNALS 1.54 AVERAGE HER2 COPY NUMBER PER CELL 2.00  RADIOGRAPHIC STUDIES: I have personally reviewed the radiological images as listed and agreed with the findings in the report.  02/20/2018 Mammogram IMPRESSION: No evidence of malignancy within either breast. Stable postsurgical changes within the left breast.  Bone Density scan 07/15/17 ASSESSMENT: The BMD measured at Femur Neck Left is 0.581 g/cm2 with a T-score of -3.3. This patient is considered osteoporotic according to [facility]) criteria. Lumbar spine was not utilized due to surgical hardware. There has been a statistically significant decrease in BMD of the left hip since prior exam dated 11/11/2008. Patient does not meet criteria for FRAX assessment. Site  Region Measured Date Measured Age [age]A BMD Significant CHANGE T-score DualFemur Neck Left 07/15/2017 78.5 -3.3 0.581 g/cm2 * Left Forearm Radius 33% 07/15/2017 78.5 -1.1 0.704 g/cm2   Bilateral diagnostic mammogram 02/03/17 IMPRESSION: Expected surgical changes in the upper-outer quadrant of the left breast. No mammographic evidence of malignancy in the bilateral breasts.  Mm Diag Breast Tomo Uni Left and [place] 01/23/2016  01/23/2016  CLINICAL DATA:  77 year old female presenting for screening recall of left breast distortion. EXAM: DIGITAL DIAGNOSTIC LEFT MAMMOGRAM WITH 3D TOMOSYNTHESIS AND CAD LEFT BREAST ULTRASOUND COMPARISON:  Previous exam(s). ACR  Breast Density Category c: The breast tissue is heterogeneously dense, which may obscure small masses. FINDINGS: There is a persistent distortion in the upper-outer quadrant of the left breast in the posterior depth with a possible tiny associated mass. Mammographic images were processed with CAD. Physical exam of the upper-outer quadrant of the left breast demonstrates a small firm palpable lump at 1 o'clock, 4 cm from the nipple. Ultrasound targeted to the left breast at 1 o'clock, 4 cm from the nipple at the site of the palpable lump, there is a tiny 3-4 mm shadowing mass with possible internal blood flow on color Doppler imaging. Ultrasound of the left axilla demonstrates multiple normal-appearing lymph nodes. IMPRESSION: 1. A 3-4 mm shadowing mass is identified at 1 o'clock in the left breast, likely corresponding with the area of distortion on the mammogram. 2.  No evidence of left axillary lymphadenopathy. RECOMMENDATION: Ultrasound-guided biopsy is recommended for the shadowing mass in the left breast at 1 o'clock. This has been scheduled for 01/28/2016 at 2 p.m. I have discussed the findings and recommendations with the patient. Results were also provided in writing at the conclusion of the visit. If applicable, a reminder letter will be sent to the patient regarding the next appointment. BI-RADS CATEGORY  4: Suspicious. Electronically Signed   By: Ammie Ferrier M.D.   On: 01/23/2016 11:40   Mm Screening Breast Tomo Bilateral  01/14/2016  CLINICAL DATA:  Screening. EXAM: DIGITAL SCREENING BILATERAL MAMMOGRAM WITH 3D TOMO WITH CAD COMPARISON:  Previous exam(s). ACR Breast Density Category c: The breast tissue is heterogeneously dense, which may obscure small masses. FINDINGS: In the left breast, possible mass with distortion warrants further evaluation. In the right breast, no findings suspicious for malignancy. Images were processed with CAD. IMPRESSION: Further evaluation is suggested for possible  mass with distortion in the left breast. RECOMMENDATION: Diagnostic mammogram and possibly ultrasound of the left breast. (Code:[id number]-L-39M) The patient will be contacted regarding the findings, and additional imaging will be scheduled. BI-RADS CATEGORY  0: Incomplete. Need additional imaging evaluation and/or prior mammograms for comparison. Electronically Signed   By: Lajean Manes M.D.   On: 01/14/2016 11:36     ASSESSMENT & PLAN:  77 y.o. postmenopausal Caucasian woman, presented with screening this covered left breast cancer.  1. Breast cancer of upper-outer quadrant of left female breast, G1 invasive ductal carcinoma, pT1bN0M0, stage IA, ER+/PR+/HER2- -I previously reviewed her surgical pathology findings with patient in details. -She has stage I a disease, low grade, likely will do very well with stroke risk of recurrence. -We previously discussed the risk of cancer recurrence after complete surgical resection. Giving the very small size of the tumor, low-grade, low Ki-67, ER PR positive HER-2 negative disease, the risk of recurrence is quite low. I do not recommend adjuvant chemotherapy. -She has completed adjuvant breast radiation. --She started adjuvant anastrozole, due to her worsening bone density, history of spinal  fracture, I have switched her anastrozole to tamoxifen.  She tolerating tamoxifen well, will continue. -Tamoxifen was held before her open heart surgery in September 2019, she is currently on Coumadin for 3 months, will restart tamoxifen when she came off Coumadin in mid December  -2019 April Mammogram was benign -She is clinically doing very well, asymptomatic, lab reviewed CBC showed Hg 11.4 and CMP is WNLs, physical exam was unremarkable today. No clinical recurrence. -We will continue breast cancer surveillance, including annual screening mammogram, self exam, routine follow-up with exam by [place]. -I advised her to take multivitamins. I will check vitamin B 12 next visit.  -  repeat mammogram in 2020 -Follow-up in 4 months.  2. Osteoporosis  -She  had spinal fracture after a mild fall in 2018 -Her PCP Dr. Brigitte Pulse has recommended her to start prolia injection, she is tolerating well  -07/15/17 bone density shows osteoporosis with the lowest T-Score in the left femur neck, -3.3, which has definitely gotten worse since 2014, when her T score was -2.2 -She will switch to Tamoxifen as of 09/30/17 and will continue prolia injections with Dr. Brigitte Pulse   3. HTN, CAD, AF, mitral regurgitation  -She'll continue follow-up with her primary care physician and cardiologist. -She is s/p CABGx1, mitral valve repair and maze operation for atrial fibrillation ON 08/10/2018  4. Genetics -Given her positive family history of breast cancer in her mother and a sister, we referred her to genetic counseling to rule out inheritable breast cancer syndrome.  -She was seen by our genetic counselor, and the genetic testing was negative.  5. Spinal fracture  -she will follow up with her orthopedic surgeon -I strongly encouraged her to follow up bone density scan every 2 years, to monitor her bone density  -Has surgery on her back on 08/23/17 to remove screws, she has recovered well    6. Macrocytic anemia  -Developed anemia after her open heart surgery, improving -She has had microcytosis even before her open heart surgery -will check folic acid, U13, methylmalonic acid level on her next visit.  Plan -She will continue hold tamoxifen for now, and restart in late December, when she come off Coumadin.   -F/u in March 2020. Will check Vitamin B 12 next visit -Mammogram in April 2020   All questions were answered. The patient knows to call the clinic with any problems, questions or concerns.  I spent 20 minutes counseling the patient face to face. The total time spent in the appointment was 25 minutes and more than 50% was on counseling.   Dierdre Searles Dweik am acting as scribe for Dr. Truitt Merle.  I have reviewed the above documentation for accuracy and completeness, and I agree with the above.     Truitt Merle, MD 09/27/2018

## 2018-09-26 ENCOUNTER — Ambulatory Visit (INDEPENDENT_AMBULATORY_CARE_PROVIDER_SITE_OTHER): Payer: Medicare Other | Admitting: Pharmacist

## 2018-09-26 ENCOUNTER — Other Ambulatory Visit: Payer: Self-pay | Admitting: Hematology

## 2018-09-26 DIAGNOSIS — Z7901 Long term (current) use of anticoagulants: Secondary | ICD-10-CM | POA: Diagnosis not present

## 2018-09-26 DIAGNOSIS — I484 Atypical atrial flutter: Secondary | ICD-10-CM

## 2018-09-26 DIAGNOSIS — Z9889 Other specified postprocedural states: Secondary | ICD-10-CM | POA: Diagnosis not present

## 2018-09-26 DIAGNOSIS — I340 Nonrheumatic mitral (valve) insufficiency: Secondary | ICD-10-CM

## 2018-09-26 DIAGNOSIS — C50412 Malignant neoplasm of upper-outer quadrant of left female breast: Secondary | ICD-10-CM

## 2018-09-26 DIAGNOSIS — I4819 Other persistent atrial fibrillation: Secondary | ICD-10-CM

## 2018-09-26 DIAGNOSIS — Z170 Estrogen receptor positive status [ER+]: Principal | ICD-10-CM

## 2018-09-26 LAB — POCT INR: INR: 3.6 — AB (ref 2.0–3.0)

## 2018-09-27 ENCOUNTER — Inpatient Hospital Stay: Payer: Medicare Other | Attending: Hematology

## 2018-09-27 ENCOUNTER — Encounter: Payer: Self-pay | Admitting: Hematology

## 2018-09-27 ENCOUNTER — Inpatient Hospital Stay (HOSPITAL_BASED_OUTPATIENT_CLINIC_OR_DEPARTMENT_OTHER): Payer: Medicare Other | Admitting: Hematology

## 2018-09-27 ENCOUNTER — Telehealth: Payer: Self-pay | Admitting: Hematology

## 2018-09-27 VITALS — BP 124/91 | HR 52 | Temp 98.1°F | Resp 18 | Ht 65.0 in | Wt 120.8 lb

## 2018-09-27 DIAGNOSIS — I25810 Atherosclerosis of coronary artery bypass graft(s) without angina pectoris: Secondary | ICD-10-CM | POA: Diagnosis not present

## 2018-09-27 DIAGNOSIS — M810 Age-related osteoporosis without current pathological fracture: Secondary | ICD-10-CM

## 2018-09-27 DIAGNOSIS — I4891 Unspecified atrial fibrillation: Secondary | ICD-10-CM | POA: Insufficient documentation

## 2018-09-27 DIAGNOSIS — Z170 Estrogen receptor positive status [ER+]: Secondary | ICD-10-CM

## 2018-09-27 DIAGNOSIS — C50412 Malignant neoplasm of upper-outer quadrant of left female breast: Secondary | ICD-10-CM | POA: Diagnosis not present

## 2018-09-27 DIAGNOSIS — D7589 Other specified diseases of blood and blood-forming organs: Secondary | ICD-10-CM

## 2018-09-27 DIAGNOSIS — D539 Nutritional anemia, unspecified: Secondary | ICD-10-CM | POA: Insufficient documentation

## 2018-09-27 DIAGNOSIS — I10 Essential (primary) hypertension: Secondary | ICD-10-CM | POA: Diagnosis not present

## 2018-09-27 DIAGNOSIS — Z7901 Long term (current) use of anticoagulants: Secondary | ICD-10-CM | POA: Diagnosis not present

## 2018-09-27 DIAGNOSIS — Z87891 Personal history of nicotine dependence: Secondary | ICD-10-CM | POA: Insufficient documentation

## 2018-09-27 DIAGNOSIS — I340 Nonrheumatic mitral (valve) insufficiency: Secondary | ICD-10-CM | POA: Diagnosis not present

## 2018-09-27 DIAGNOSIS — Z9181 History of falling: Secondary | ICD-10-CM

## 2018-09-27 LAB — CMP (CANCER CENTER ONLY)
ALK PHOS: 73 U/L (ref 38–126)
ALT: 14 U/L (ref 0–44)
ANION GAP: 10 (ref 5–15)
AST: 24 U/L (ref 15–41)
Albumin: 3.2 g/dL — ABNORMAL LOW (ref 3.5–5.0)
BUN: 14 mg/dL (ref 8–23)
CO2: 26 mmol/L (ref 22–32)
Calcium: 8.9 mg/dL (ref 8.9–10.3)
Chloride: 107 mmol/L (ref 98–111)
Creatinine: 0.87 mg/dL (ref 0.44–1.00)
GFR, Est AFR Am: >60 mL/min (ref >60)
GFR, Estimated: >60 mL/min (ref >60)
Glucose, Bld: 83 mg/dL (ref 70–99)
POTASSIUM: 4.4 mmol/L (ref 3.5–5.1)
Sodium: 143 mmol/L (ref 135–145)
Total Bilirubin: 0.5 mg/dL (ref 0.3–1.2)
Total Protein: 6.8 g/dL (ref 6.5–8.1)

## 2018-09-27 LAB — CBC WITH DIFFERENTIAL/PLATELET
ABS IMMATURE GRANULOCYTES: 0.03 10*3/uL (ref 0.00–0.07)
BASOS PCT: 1 %
Basophils Absolute: 0 10*3/uL (ref 0.0–0.1)
Eosinophils Absolute: 0.3 10*3/uL (ref 0.0–0.5)
Eosinophils Relative: 6 %
HCT: 35.7 % — ABNORMAL LOW (ref 36.0–46.0)
Hemoglobin: 11.4 g/dL — ABNORMAL LOW (ref 12.0–15.0)
IMMATURE GRANULOCYTES: 1 %
Lymphocytes Relative: 15 %
Lymphs Abs: 0.7 10*3/uL (ref 0.7–4.0)
MCH: 33.5 pg (ref 26.0–34.0)
MCHC: 31.9 g/dL (ref 30.0–36.0)
MCV: 105 fL — ABNORMAL HIGH (ref 80.0–100.0)
Monocytes Absolute: 0.5 10*3/uL (ref 0.1–1.0)
Monocytes Relative: 11 %
NEUTROS ABS: 3.1 10*3/uL (ref 1.7–7.7)
NEUTROS PCT: 66 %
Platelets: 169 10*3/uL (ref 150–400)
RBC: 3.4 MIL/uL — AB (ref 3.87–5.11)
RDW: 14.8 % (ref 11.5–15.5)
WBC: 4.7 10*3/uL (ref 4.0–10.5)
nRBC: 0 % (ref 0.0–0.2)

## 2018-09-27 NOTE — Telephone Encounter (Signed)
Appts scheduled avs/calendar printed per 11/6 los °

## 2018-09-28 ENCOUNTER — Ambulatory Visit: Payer: Medicare Other | Admitting: Endocrinology

## 2018-09-28 NOTE — Progress Notes (Signed)
Cardiology Office Note   Date:  09/29/2018   ID:  Michele Green, DOB June 18, 1941, MRN [id number]  PCP:  [name], MD  Cardiologist:  [name]    Chief Complaint  Patient presents with  . Atrial Flutter      History of Present Illness: Michele Green is a 77 y.o. female who presents for a flutter and arrange DCCV.   She has a history of atrial fib - she previously had elected to be managed with rate control and Xarelto. Other issues include HTN, &breast cancer. atrial fibrillation,coronary artery disease,hypertension, breast cancer, mitral regurgitation.She was recently admitted 9/19-9/26 and underwentcoronary artery bypass grafting(L-LAD), mitral valve repair and Maze procedure. She was placed on prophylactic Amiodarone. She was placed on Warfarin for anticoagulationpost operatively. She required Metoprolol tartrate and digoxin to control her HR. She remained in AFlutter at the time of DC. She was DC to SNF.   Last visit was post op- surgery on 08/10/18,    She is doing well on Coumadin with no bleeding though she will be glad to go back to Xarelto in 3 months.  Dr. Roxy Manns would like her on it for 3 months, for MV Repair with ring.  Then we would change to Xarelto.  She does not mind staying on anticoagulation just not coumadin.  She has had 2 episodes of dizziness several weeks apart.  Usually when standing.  Her lasix is every other day.  Her appetite is decreased on amiodarone.   She remains in a flutter.    On last visit plans were for INR for 3 weeks then proceed with DCCV.  With the amiodarone and Maze procedure our hope is she will return to SR.  If not she will continue a fib.  Her rate is controlled today.  No pain though some tenderness along incision.     Today rate controlled A flutter, mild dizziness on occ.  No bleeding in stools or urine.  Somewhat anxious about DCCV.   She is most frustrated no being able to do as much - we discussed healing  would occur and she will be more energetic 2020.   She has had recent UTI and just finished Cipro.   Past Medical History:  Diagnosis Date  . Atypical atrial flutter ([place]) 08/14/2018   Post-operative  . Breast cancer of upper-outer quadrant of left female breast ([place]) 02/02/2016  . CAD (coronary artery disease) 06/20/2018   LHC 7/19: pLAD 65/90, oD1 90, mLCx 85, OM2 50, oRCA 30, EF 50-55 >> s/p CABG in 9/19 (L-LAD)  . Colon polyp 02/2012  . Dupuytren contracture    bilateral hands  . Family history of breast cancer   . Hypertension   . Mitral regurgitation 11/07/2013   Echo 7/19: Mild LVH, EF 60-65, no RWMA, mod ot severe MR, massive LAE, PASP 33 // TEE 7/19:  Mild conc LVH, EF 60-65, no RWMA, severe MR with mild post leaflet prolapse, massive // s/p MV repair 07/2018  . On continuous oral anticoagulation 08/22/2015   Started on Xarelto 08/12/2015   . Osteopenia   . Persistent atrial fibrillation 08/22/2015   Started late August or early September 2016 // s/p Maze procedure 07/2018  . Radiation Therapy 04/21/16-05/19/16   left breast 47.72 Gy, boosted to 10 Gy  . 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 S/P Maze operation for atrial fibrillation 08/10/2018   Complete bilateral atrial lesion set using bipolar radiofrequency and cryothermy ablation  with clipping of LA appendage  . S/P MVR (mitral valve repair) 08/10/2018   Complex valvuloplasty including Gore-tex neochord placement x8, Plication of Lateral Commissure and 76m Sorin Memo 4D Ring Annuloplasty SN# [id number] . Ulcerative colitis ([place]   . Wears glasses     Past Surgical History:  Procedure Laterality Date  . BREAST BIOPSY Left 01/28/2016  . BREAST LUMPECTOMY Left 02/23/2016  . BREAST LUMPECTOMY WITH RADIOACTIVE SEED LOCALIZATION Left 02/23/2016   Procedure: BREAST LUMPECTOMY WITH RADIOACTIVE SEED LOCALIZATION;  Surgeon: BExcell Seltzer MD;  [place] [facility]  Service: General;  Laterality: Left;  .  CARDIOVERSION N/A 10/02/2015   Procedure: CARDIOVERSION;  Surgeon: MJerline Pain MD;  [place] [facility]  Service: Cardiovascular;  Laterality: N/A;  . CLIPPING OF ATRIAL APPENDAGE  08/10/2018   Procedure: CLIPPING OF ATRIAL APPENDAGE;  Surgeon: ORexene Alberts MD;  [place] [facility]  Service: Open Heart Surgery;;  . COLONOSCOPY    . CORONARY ARTERY BYPASS GRAFT N/A 08/10/2018   Procedure: CORONARY ARTERY BYPASS GRAFTING (CABG) x 1, LIMA-LAD,  USING LEFT INTERNAL MAMMARY ARTERY. HARVESTED RIGHT GREATER SAPHENOUS VEIN ENDOSCOPICALLY;  Surgeon: ORexene Alberts MD;  [place] [facility]  Service: Open Heart Surgery;  Laterality: N/A;  . DUPUYTREN CONTRACTURE RELEASE  2001   leftx2  . [place]  right  . DUPUYTREN CONTRACTURE RELEASE Right 05/02/2014   Procedure: EXCISION DUPUYTRENS RIGHT PALMAR/SMALL ;  Surgeon: RCammie Sickle MD;  [place] [facility]  Service: Orthopedics;  Laterality: Right;  .Marland KitchenMAZE N/A 08/10/2018   Procedure: MAZE;  Surgeon: ORexene Alberts MD;  [place] [facility]  Service: Open Heart Surgery;  Laterality: N/A;  . MITRAL VALVE REPAIR N/A 08/10/2018   Procedure: MITRAL VALVE REPAIR (MVR);  Surgeon: ORexene Alberts MD;  [place] [facility]  Service: Open Heart Surgery;  Laterality: N/A;  glutaraldehyde  . RIGHT/LEFT HEART CATH AND CORONARY ANGIOGRAPHY N/A 06/20/2018   Procedure: RIGHT/LEFT HEART CATH AND CORONARY ANGIOGRAPHY;  Surgeon: SBelva Crome MD;  [facility];  Service: Cardiovascular;  Laterality: N/A;  . TEE WITHOUT CARDIOVERSION N/A 06/20/2018   Procedure: TRANSESOPHAGEAL ECHOCARDIOGRAM (TEE);  Surgeon: HPixie Casino MD;  [facility];  Service: Cardiovascular;  Laterality: N/A;  . TEE WITHOUT CARDIOVERSION N/A 08/10/2018   Procedure: TRANSESOPHAGEAL ECHOCARDIOGRAM (TEE);  Surgeon: ORexene Alberts MD;  [place] [facility]  Service: Open Heart Surgery;  Laterality: N/A;  . TONSILLECTOMY  age 77      Current Outpatient Medications  Medication Sig Dispense Refill  . acetaminophen (TYLENOL) 325 MG tablet Take 2 tablets (650 mg total) by mouth every 6 (six) hours as needed for mild pain or fever.    .Marland Kitchenallopurinol (ZYLOPRIM) 100 MG tablet Take 200 mg by mouth daily.     .Marland Kitchenamiodarone (PACERONE) 200 MG tablet Take 1 tablet (200 mg total) by mouth daily. 30 tablet 11  . aspirin EC 81 MG EC tablet Take 1 tablet (81 mg total) by mouth daily.    . ciprofloxacin (CIPRO) 500 MG tablet Take 1 tablet (500 mg total) by mouth 2 (two) times daily. 6 tablet 0  . digoxin 62.5 MCG TABS Take 0.0625 mg by mouth daily.    . furosemide (LASIX) 20 MG tablet Take 20 mg by mouth.    .Marland KitchenLIALDA 1.2 G EC tablet Take 4.8 g by mouth daily.     . metoprolol tartrate (LOPRESSOR) 100  MG tablet Take 1 tablet (100 mg total) by mouth 2 (two) times daily.    . potassium chloride SA (K-DUR,KLOR-CON) 20 MEQ tablet Take 1 tablet (20 mEq total) by mouth daily. 90 tablet 3  . simvastatin (ZOCOR) 20 MG tablet Take 20 mg by mouth daily.     . traMADol (ULTRAM) 50 MG tablet Take 1 tablet (50 mg total) by mouth every 4 (four) hours as needed for moderate pain. 28 tablet 0  . warfarin (COUMADIN) 1 MG tablet Take 1 tablet (1 mg total) by mouth daily at 6 PM. 30 tablet 3  . tamoxifen (NOLVADEX) 20 MG tablet Take 1 tablet (20 mg total) by mouth daily. (Patient not taking: Reported on 09/29/2018) 90 tablet 1   No current facility-administered medications for this visit.     Allergies:   Shellfish allergy    Social History:  The patient  reports that she quit smoking about 25 years ago. Her smoking use included cigarettes. She has a 20.00 pack-year smoking history. She has never used smokeless tobacco. She reports that she drinks about 3.0 standard drinks of alcohol per week. She reports that she does not use drugs.   Family History:  The patient's family history includes Breast cancer in her sister; Breast cancer (age of onset: 69)  in her mother; Cirrhosis in her mother; Heart attack in her father; Heart failure in her father; Kidney Stones in her sister.    ROS:  General:no colds or fevers, no weight changes Skin:no rashes or ulcers HEENT:no blurred vision, no congestion CV:see HPI PUL:see HPI GI:no diarrhea constipation or melena, no indigestion GU:no hematuria, no dysuria MS:no joint pain, no claudication Neuro:no syncope, occ lightheadedness Endo:no diabetes, no thyroid disease  Wt Readings from Last 3 Encounters:  09/29/18 119 lb 9.6 oz (54.3 kg)  09/27/18 120 lb 12.8 oz (54.8 kg)  09/12/18 120 lb 6.4 oz (54.6 kg)     PHYSICAL EXAM: VS:  BP 120/70   Pulse 72   Ht 5' 5"  (1.651 m)   Wt 119 lb 9.6 oz (54.3 kg)   BMI 19.90 kg/m  , BMI Body mass index is 19.9 kg/m. General:Pleasant affect, NAD Skin:Warm and dry, brisk capillary refill HEENT:normocephalic, sclera clear, mucus membranes moist Neck:supple, no JVD, no bruits  Heart:irre irreg without murmur, gallup, rub or click Lungs:clear without rales, rhonchi, or wheezes VVO:HYWV, non tender, + BS, do not palpate liver spleen or masses Ext:no lower ext edema, 2+ pedal pulses, 2+ radial pulses Neuro:alert and oriented X 3, MAE, follows commands, + facial symmetry    EKG:  EKG is ordered today. The ekg ordered today demonstrates atrial flutter with ST & T wave abnormality inf. Lateral stable.   Recent Labs: 05/30/2018: NT-Pro BNP 5,122; TSH 1.200 08/16/2018: Magnesium 1.9 09/27/2018: ALT 14; BUN 14; Creatinine 0.87; Hemoglobin 11.4; Platelets 169; Potassium 4.4; Sodium 143    Lipid Panel No results found for: CHOL, TRIG, HDL, CHOLHDL, VLDL, LDLCALC, LDLDIRECT     Other studies Reviewed: Additional studies/ records that were reviewed today include:   OP NOTE 08/10/18 MITRAL VALVE REPAIR (MVR)  -Complex Valvular Annuloplasty -28 mm Sorin Memo 4D Ring -Placement of 8 Gore-tex Neochords -Debridement of Annular Calcification -Plication of  Anterior Leaflet P3  CORONARY ARTERY BYPASS GRAFTING x 1 -Left Internal Mammary Artery to Left Anterior Descending Artery  ENDOSCOPIC HARVEST GREATER SAPHENOUS VEIN -Right Thigh . TEE echo 08/10/18  Result status: Final result   Left atrium: Cavity is moderately to severely dilated.  No spontaneous echo contrast.  Aortic valve: The valve is trileaflet. No stenosis. No regurgitation.  Mitral valve: Dilated mitral annulus. Mild leaflet calcification is present. Predominantly posterior moderate mitral annular calcification. Severe regurgitation. There is mild prolapse of the posterior mitral leaflet. Small flail portion of the posterior leaflet. There is a cleft in the posterior leaflet.  Right ventricle: Normal cavity size, wall thickness and ejection fraction.  Right atrium: Cavity is mildly dilated.  Tricuspid valve: Mild regurgitation.  Pulmonic valve: Trace regurgitation.    DOPPLERS  Final Interpretation: Right Carotid: Velocities in the right ICA are consistent with a 1-39% stenosis.  Left Carotid: Velocities in the left ICA are consistent with a 1-39% stenosis. Vertebrals: Bilateral vertebral arteries demonstrate antegrade flow. Subclavians: Normal flow hemodynamics were seen in bilateral subclavian       arteries.  Right Upper Extremity: Doppler waveforms remain within normal limits with right radial compression. Doppler waveforms remain within normal limits with right ulnar compression. Left Upper Extremity: Doppler waveforms remain within normal limits with left radial compression. Doppler waveforms remain within normal limits with left ulnar compression.  Cardiac cath 05/24/18  Severe mitral regurgitation with marked enlargement of the left atrium.  Mild pulmonary hypertension with mean pulmonary arterial pressure 28 mmHg.  Phasic pressure 45/16 mmHg  Three-vessel coronary calcification with significant greater than 80% distal circumflex, 90% first diagonal  ostium, and 80 to 90% mid LAD.  Normal LV systolic function with EF 50 to 55%.  LVEDP 11 mmHg  Cardiac output 4.3 L/min/cardiac index 2.7 L/min/m  RECOMMENDATIONS:   Referred to Dr. Remus Loffler for consideration of surgical mitral valve repair left atrial appendage ligation, consideration of Maze procedure, and coronary revascularization.    ASSESSMENT AND PLAN:  1.  Atrial flutter with rate control on amiodarone, dig and BB.  She has been therapeutic on coumadin for last 4 weeks.    09/05/18  3.3  INR 09/12/18  2.5  INR 09/19/18  3.6  INR 09/26/18    3.6  INR Plan for DCCV next week.  I reviewed side effects, bradycardia and reaction to anesthesia.she is willing to proceed.  2.  S/p MV repair, CABG X 1 and MAZE procedure.   Will arrange post op Echo on visit back from [place].  3.  anticoagulation with coumadin see above for INRs.  Once she is on coumadin for 3 months post MV Repair she could change to xarelto.    4.  HTN controlled. Continue meds  5.  HLD continue statin.   Follow up 1-2 week post DCCV with APP or [name].     Current medicines are reviewed with the patient today.  The patient Has no concerns regarding medicines.  The following changes have been made:  See above Labs/ tests ordered today include:see above  Disposition:   FU:  see above  Signed, Cecilie Kicks, NP  09/29/2018 11:11 AM    [address], [place], [place], [place] Phone: [phone]; Fax: [phone]

## 2018-09-29 ENCOUNTER — Encounter: Payer: Self-pay | Admitting: *Deleted

## 2018-09-29 ENCOUNTER — Ambulatory Visit: Payer: Medicare Other | Admitting: Cardiology

## 2018-09-29 ENCOUNTER — Encounter: Payer: Self-pay | Admitting: Cardiology

## 2018-09-29 VITALS — BP 120/70 | HR 72 | Ht 65.0 in | Wt 119.6 lb

## 2018-09-29 DIAGNOSIS — Z7901 Long term (current) use of anticoagulants: Secondary | ICD-10-CM

## 2018-09-29 DIAGNOSIS — I340 Nonrheumatic mitral (valve) insufficiency: Secondary | ICD-10-CM

## 2018-09-29 DIAGNOSIS — Z79899 Other long term (current) drug therapy: Secondary | ICD-10-CM

## 2018-09-29 DIAGNOSIS — I2510 Atherosclerotic heart disease of native coronary artery without angina pectoris: Secondary | ICD-10-CM

## 2018-09-29 DIAGNOSIS — Z951 Presence of aortocoronary bypass graft: Secondary | ICD-10-CM

## 2018-09-29 DIAGNOSIS — Z9889 Other specified postprocedural states: Secondary | ICD-10-CM | POA: Diagnosis not present

## 2018-09-29 DIAGNOSIS — I484 Atypical atrial flutter: Secondary | ICD-10-CM

## 2018-09-29 NOTE — Patient Instructions (Addendum)
Medication Instructions:  No changes If you need a refill on your cardiac medications before your next appointment, please call your pharmacy.   Lab work: none If you have labs (blood work) drawn today and your tests are completely normal, you will receive your results only by: [name] MyChart Message (if you have MyChart) OR . A paper copy in the mail If you have any lab test that is abnormal or we need to change your treatment, we will call you to review the results.  Testing/Procedures: Your physician has recommended that you have a Cardioversion (DCCV). Electrical Cardioversion uses a jolt of electricity to your heart either through paddles or wired patches attached to your chest. This is a controlled, usually prescheduled, procedure. Defibrillation is done under light anesthesia in the hospital, and you usually go home the day of the procedure. This is done to get your heart back into a normal rhythm. You are not awake for the procedure. Please see the instruction sheet given to you today.  Your physician has requested that you have an echocardiogram. Echocardiography is a painless test that uses sound waves to create images of your heart. It provides your doctor with information about the size and shape of your heart and how well your heart's chambers and valves are working. This procedure takes approximately one hour. There are no restrictions for this procedure.    . Follow-Up: . With [name] or APP in 2 weeks or first available appointment.--see below.  Any Other Special Instructions Will Be Listed Below (If Applicable). none

## 2018-10-02 ENCOUNTER — Telehealth: Payer: Self-pay | Admitting: *Deleted

## 2018-10-02 NOTE — Telephone Encounter (Signed)
Spoke with pt and she has been made aware to hold the Digoxin on the morning of her Cardioversion. Pt verbalized understanding and thanked me for the call.

## 2018-10-02 NOTE — Telephone Encounter (Signed)
-----   Message from Michele Serge, NP sent at 09/29/2018  5:26 PM EST ----- Please ask pt to hold digoxin the AM of DCCV.  Thanks [name].

## 2018-10-03 ENCOUNTER — Ambulatory Visit (INDEPENDENT_AMBULATORY_CARE_PROVIDER_SITE_OTHER): Payer: Medicare Other | Admitting: Pharmacist

## 2018-10-03 ENCOUNTER — Other Ambulatory Visit: Payer: Self-pay

## 2018-10-03 ENCOUNTER — Ambulatory Visit (HOSPITAL_COMMUNITY): Payer: Medicare Other | Attending: Cardiology

## 2018-10-03 DIAGNOSIS — Z7901 Long term (current) use of anticoagulants: Secondary | ICD-10-CM | POA: Diagnosis not present

## 2018-10-03 DIAGNOSIS — I4819 Other persistent atrial fibrillation: Secondary | ICD-10-CM | POA: Diagnosis not present

## 2018-10-03 DIAGNOSIS — Z9889 Other specified postprocedural states: Secondary | ICD-10-CM

## 2018-10-03 DIAGNOSIS — I340 Nonrheumatic mitral (valve) insufficiency: Secondary | ICD-10-CM | POA: Diagnosis present

## 2018-10-03 DIAGNOSIS — I484 Atypical atrial flutter: Secondary | ICD-10-CM | POA: Diagnosis not present

## 2018-10-03 LAB — POCT INR: INR: 3.1 — AB (ref 2.0–3.0)

## 2018-10-03 NOTE — Patient Instructions (Signed)
Description   Spoke with Geronimo Boot RN with [facility] and instructed pt to continue same dosage 42m daily (pending DCCV on 11/14).  Recheck in 1 week. [facility] #[phone]#[phone]

## 2018-10-05 ENCOUNTER — Other Ambulatory Visit: Payer: Self-pay

## 2018-10-05 ENCOUNTER — Ambulatory Visit (HOSPITAL_COMMUNITY)
Admission: RE | Admit: 2018-10-05 | Discharge: 2018-10-05 | Disposition: A | Discharge status: Home / Self Care | Payer: Medicare Other | Source: Ambulatory Visit | Attending: Internal Medicine | Admitting: Internal Medicine

## 2018-10-05 ENCOUNTER — Encounter (HOSPITAL_COMMUNITY): Payer: Self-pay | Admitting: *Deleted

## 2018-10-05 ENCOUNTER — Ambulatory Visit (HOSPITAL_COMMUNITY): Payer: Medicare Other | Admitting: Anesthesiology

## 2018-10-05 ENCOUNTER — Encounter (HOSPITAL_COMMUNITY)
Admission: RE | Disposition: A | Discharge status: Home / Self Care | Payer: Self-pay | Source: Ambulatory Visit | Attending: Internal Medicine

## 2018-10-05 DIAGNOSIS — I2510 Atherosclerotic heart disease of native coronary artery without angina pectoris: Secondary | ICD-10-CM | POA: Diagnosis not present

## 2018-10-05 DIAGNOSIS — I340 Nonrheumatic mitral (valve) insufficiency: Secondary | ICD-10-CM | POA: Insufficient documentation

## 2018-10-05 DIAGNOSIS — E785 Hyperlipidemia, unspecified: Secondary | ICD-10-CM | POA: Insufficient documentation

## 2018-10-05 DIAGNOSIS — I10 Essential (primary) hypertension: Secondary | ICD-10-CM | POA: Insufficient documentation

## 2018-10-05 DIAGNOSIS — I951 Orthostatic hypotension: Secondary | ICD-10-CM | POA: Diagnosis not present

## 2018-10-05 DIAGNOSIS — Z7982 Long term (current) use of aspirin: Secondary | ICD-10-CM | POA: Diagnosis not present

## 2018-10-05 DIAGNOSIS — Z7901 Long term (current) use of anticoagulants: Secondary | ICD-10-CM | POA: Insufficient documentation

## 2018-10-05 DIAGNOSIS — I4819 Other persistent atrial fibrillation: Secondary | ICD-10-CM | POA: Insufficient documentation

## 2018-10-05 DIAGNOSIS — Z8249 Family history of ischemic heart disease and other diseases of the circulatory system: Secondary | ICD-10-CM | POA: Insufficient documentation

## 2018-10-05 DIAGNOSIS — Z87891 Personal history of nicotine dependence: Secondary | ICD-10-CM | POA: Diagnosis not present

## 2018-10-05 DIAGNOSIS — Z951 Presence of aortocoronary bypass graft: Secondary | ICD-10-CM | POA: Diagnosis not present

## 2018-10-05 DIAGNOSIS — I272 Pulmonary hypertension, unspecified: Secondary | ICD-10-CM | POA: Insufficient documentation

## 2018-10-05 DIAGNOSIS — I4892 Unspecified atrial flutter: Secondary | ICD-10-CM | POA: Insufficient documentation

## 2018-10-05 HISTORY — PX: CARDIOVERSION: SHX1299

## 2018-10-05 SURGERY — CARDIOVERSION
Anesthesia: General

## 2018-10-05 MED ORDER — PROPOFOL 10 MG/ML IV BOLUS
INTRAVENOUS | Status: DC | PRN
  Administered 2018-10-05: 20 mg via INTRAVENOUS
  Administered 2018-10-05: 30 mg via INTRAVENOUS
  Administered 2018-10-05: 50 mg via INTRAVENOUS

## 2018-10-05 MED ORDER — PROPOFOL 500 MG/50ML IV EMUL: INTRAVENOUS | Status: DC | PRN

## 2018-10-05 MED ORDER — SODIUM CHLORIDE 0.9 % IV SOLN
INTRAVENOUS | Status: DC
  Administered 2018-10-05: 14:00:00 via INTRAVENOUS

## 2018-10-05 MED ORDER — LIDOCAINE HCL (CARDIAC) PF 100 MG/5ML IV SOSY
PREFILLED_SYRINGE | INTRAVENOUS | Status: DC | PRN
  Administered 2018-10-05: 60 mg via INTRATRACHEAL

## 2018-10-05 NOTE — Transfer of Care (Signed)
Immediate Anesthesia Transfer of Care Note  Patient: KATALEIA QUARANTA  Procedure(s) Performed: CARDIOVERSION (N/A )  Patient Location: Endoscopy Unit  Anesthesia Type:General  Level of Consciousness: awake and patient cooperative  Airway & Oxygen Therapy: Patient Spontanous Breathing and Patient connected to nasal cannula oxygen  Post-op Assessment: Report given to RN, Post -op Vital signs reviewed and stable and Patient moving all extremities  Post vital signs: Reviewed and stable  Last Vitals:  Vitals Value Taken Time  BP    Temp    Pulse    Resp    SpO2      Last Pain:  Vitals:   10/05/18 1332  TempSrc: Oral  PainSc: 0-No pain         Complications: No apparent anesthesia complications

## 2018-10-05 NOTE — Discharge Instructions (Signed)
Electrical Cardioversion, Care After °This sheet gives you information about how to care for yourself after your procedure. Your health care provider may also give you more specific instructions. If you have problems or questions, contact your health care provider. °What can I expect after the procedure? °After the procedure, it is common to have: °· Some redness on the skin where the shocks were given. ° °Follow these instructions at home: °· Do not drive for 24 hours if you were given a medicine to help you relax (sedative). °· Take over-the-counter and prescription medicines only as told by your health care provider. °· Ask your health care provider how to check your pulse. Check it often. °· Rest for 48 hours after the procedure or as told by your health care provider. °· Avoid or limit your caffeine use as told by your health care provider. °Contact a health care provider if: °· You feel like your heart is beating too quickly or your pulse is not regular. °· You have a serious muscle cramp that does not go away. °Get help right away if: °· You have discomfort in your chest. °· You are dizzy or you feel faint. °· You have trouble breathing or you are short of breath. °· Your speech is slurred. °· You have trouble moving an arm or leg on one side of your body. °· Your fingers or toes turn cold or blue. °This information is not intended to replace advice given to you by your health care provider. Make sure you discuss any questions you have with your health care provider. °Document Released: 08/29/2013 Document Revised: 06/11/2016 Document Reviewed: 05/14/2016 °Elsevier Interactive Patient Education © 2018 [employer] ° °

## 2018-10-05 NOTE — Anesthesia Preprocedure Evaluation (Addendum)
Anesthesia Evaluation  Patient identified by MRN, date of birth, ID band Patient awake    Reviewed: Allergy & Precautions, NPO status , Patient's Chart, lab work & pertinent test results, reviewed documented beta blocker date and time   Airway Mallampati: II  TM Distance: >3 FB Neck ROM: Full    Dental no notable dental hx.    Pulmonary former smoker,    Pulmonary exam normal breath sounds clear to auscultation       Cardiovascular hypertension, Pt. 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 Normal LV EF, trivial MR. Mild-moderate TR with RV-RA gradient of 37 mmHg. Trivial posterior pericardial effusion.   Neuro/Psych negative neurological ROS  negative psych ROS   GI/Hepatic Neg liver ROS, Ulcerative colitis    Endo/Other  negative endocrine ROS  Renal/GU negative Renal ROS     Musculoskeletal Gout   Abdominal   Peds  Hematology  (+) anemia , INR: 3.1   Anesthesia Other Findings A-FLUTTER  Reproductive/Obstetrics                            Anesthesia Physical Anesthesia Plan  ASA: III  Anesthesia Plan: General   Post-op Pain Management:    Induction: Intravenous  PONV Risk Score and Plan: 3 and Propofol infusion and Treatment may vary due to age or medical condition  Airway Management Planned: Mask  Additional Equipment:   Intra-op Plan:   Post-operative Plan:   Informed Consent: I have reviewed the patients History and Physical, chart, labs and discussed the procedure including the risks, benefits and alternatives for the proposed anesthesia with the patient or authorized representative who has indicated his/her understanding and acceptance.   Dental advisory given  Plan  Discussed with: CRNA  Anesthesia Plan Comments:         Anesthesia Quick Evaluation

## 2018-10-05 NOTE — Anesthesia Postprocedure Evaluation (Signed)
Anesthesia Post Note  Patient: Michele Green  Procedure(s) Performed: CARDIOVERSION (N/A )     Patient location during evaluation: PACU Anesthesia Type: General Level of consciousness: awake and alert Pain management: pain level controlled Vital Signs Assessment: post-procedure vital signs reviewed and stable Respiratory status: spontaneous breathing, nonlabored ventilation, respiratory function stable and patient connected to nasal cannula oxygen Cardiovascular status: blood pressure returned to baseline and stable Postop Assessment: no apparent nausea or vomiting Anesthetic complications: no    Last Vitals:  Vitals:   10/05/18 1515 10/05/18 1525  BP: (!) 112/41 (!) 120/54  Pulse: 65 65  Resp: 15 19  Temp:    SpO2: 100% 99%    Last Pain:  Vitals:   10/05/18 1500  TempSrc:   PainSc: 0-No pain                 Ryan P Ellender

## 2018-10-05 NOTE — Interval H&P Note (Signed)
History and Physical Interval Note:  10/05/2018 1:29 PM  Michele Green  has presented today for surgery, with the diagnosis of AFLUTTER  The various methods of treatment have been discussed with the patient and family. After consideration of risks, benefits and other options for treatment, the patient has consented to  Procedure(s): CARDIOVERSION (N/A) as a surgical intervention .  The patient's history has been reviewed, patient examined, no change in status, stable for surgery.  I have reviewed the patient's chart and labs.  Questions were answered to the patient's satisfaction.     [name]

## 2018-10-05 NOTE — Op Note (Signed)
Cardioversion  Patient anesthetized by anesthesia with Propofol  WIth pads in AP position, patient cardioverted to SR with 200 J biphasic energy  Procedure was without complication  [name]

## 2018-10-09 ENCOUNTER — Encounter (HOSPITAL_COMMUNITY): Payer: Self-pay | Admitting: Internal Medicine

## 2018-10-10 ENCOUNTER — Ambulatory Visit (INDEPENDENT_AMBULATORY_CARE_PROVIDER_SITE_OTHER): Payer: Medicare Other | Admitting: Pharmacist

## 2018-10-10 DIAGNOSIS — Z7901 Long term (current) use of anticoagulants: Secondary | ICD-10-CM | POA: Diagnosis not present

## 2018-10-10 DIAGNOSIS — I484 Atypical atrial flutter: Secondary | ICD-10-CM

## 2018-10-10 DIAGNOSIS — I340 Nonrheumatic mitral (valve) insufficiency: Secondary | ICD-10-CM

## 2018-10-10 DIAGNOSIS — Z9889 Other specified postprocedural states: Secondary | ICD-10-CM | POA: Diagnosis not present

## 2018-10-10 DIAGNOSIS — I4819 Other persistent atrial fibrillation: Secondary | ICD-10-CM

## 2018-10-10 LAB — POCT INR: INR: 2.3 (ref 2.0–3.0)

## 2018-10-17 ENCOUNTER — Ambulatory Visit (INDEPENDENT_AMBULATORY_CARE_PROVIDER_SITE_OTHER): Payer: Medicare Other | Admitting: Interventional Cardiology

## 2018-10-17 DIAGNOSIS — Z7901 Long term (current) use of anticoagulants: Secondary | ICD-10-CM | POA: Diagnosis not present

## 2018-10-17 DIAGNOSIS — I340 Nonrheumatic mitral (valve) insufficiency: Secondary | ICD-10-CM

## 2018-10-17 DIAGNOSIS — I484 Atypical atrial flutter: Secondary | ICD-10-CM

## 2018-10-17 DIAGNOSIS — I4819 Other persistent atrial fibrillation: Secondary | ICD-10-CM

## 2018-10-17 DIAGNOSIS — Z9889 Other specified postprocedural states: Secondary | ICD-10-CM

## 2018-10-17 LAB — POCT INR: INR: 2.9 (ref 2.0–3.0)

## 2018-10-24 ENCOUNTER — Ambulatory Visit (INDEPENDENT_AMBULATORY_CARE_PROVIDER_SITE_OTHER): Payer: Medicare Other | Admitting: Cardiology

## 2018-10-24 DIAGNOSIS — Z9889 Other specified postprocedural states: Secondary | ICD-10-CM

## 2018-10-24 DIAGNOSIS — I4819 Other persistent atrial fibrillation: Secondary | ICD-10-CM

## 2018-10-24 DIAGNOSIS — I340 Nonrheumatic mitral (valve) insufficiency: Secondary | ICD-10-CM

## 2018-10-24 DIAGNOSIS — I484 Atypical atrial flutter: Secondary | ICD-10-CM

## 2018-10-24 DIAGNOSIS — Z7901 Long term (current) use of anticoagulants: Secondary | ICD-10-CM

## 2018-10-24 LAB — POCT INR: INR: 2.4 (ref 2.0–3.0)

## 2018-10-25 NOTE — Progress Notes (Signed)
Cardiology Office Note   Date:  10/26/2018   ID:  Michele Green, DOB 10/26/1941, MRN [id number]  PCP:  [name], MD  Cardiologist:  [name]    Chief Complaint  Patient presents with  . Hospitalization Follow-up    post DCCV      History of Present Illness: Michele Green is a 77 y.o. female who presents for post DCCV 10/05/18.    She has a history of atrial fib - she previously had elected to be managed with rate control and Xarelto. Other issues include HTN, &breast cancer.atrial fibrillation,coronary artery disease,hypertension, breast cancer, mitral regurgitation.She was recently admitted 9/19-9/26 and underwentcoronary artery bypass grafting(L-LAD), mitral valve repair and Maze procedure. She was placed on prophylactic Amiodarone. She was placed on Warfarin for anticoagulationpost operatively. She required Metoprolol tartrate and digoxin to control her HR. She remained in AFlutter at the time of DC. She was DC to SNF.  Last visit was post op- surgery on 08/10/18,   She is doing well on Coumadin with no bleeding though she will be glad to go back to Xarelto in 3 months. Dr. Roxy Manns would like her on it for 3 months, for MV Repair with ring. Then we would change to Xarelto. She does not mind staying on anticoagulation just not coumadin. She has had 2 episodes of dizziness several weeks apart. Usually when standing. Her lasix is every other day. Her appetite is decreased on amiodarone.  She remains in a flutter.  On last visit plans were for INR for 3 weeks then proceed with DCCV.  With the amiodarone and Maze procedure our hope is she will return to SR.  If not she will continue a fib.  Her rate is controlled today.  No pain though some tenderness along incision.     09/29/18  rate controlled A flutter, mild dizziness on occ.  No bleeding in stools or urine.  Somewhat anxious about DCCV.   She is most frustrated no being able to do as much -  we discussed healing would occur and she will be more energetic 2020.   She has had recent UTI and just finished Cipro.   She had 4 weeks of anticoagulation therapeutic and underwent DCCV 10/05/18 that was successful.  Continues therapeutic on coumadin.    Today she is back in a fib.  She could not tell any difference after the DCCV.  She still has some chest wall soreness.  Her energy is slowly returning. Her appetite is good and she will begin cardiac rehab soon.  She continues with brief episodes of dizziness.   She relates this to coumadin.  She is no longer on lasix and has no edema. Recent echo with stable MV ring.    Past Medical History:  Diagnosis Date  . Atypical atrial flutter ([place]) 08/14/2018   Post-operative  . Breast cancer of upper-outer quadrant of left female breast ([place]) 02/02/2016  . CAD (coronary artery disease) 06/20/2018   LHC 7/19: pLAD 65/90, oD1 90, mLCx 85, OM2 50, oRCA 30, EF 50-55 >> s/p CABG in 9/19 (L-LAD)  . Colon polyp 02/2012  . Dupuytren contracture    bilateral hands  . Family history of breast cancer   . Hypertension   . Mitral regurgitation 11/07/2013   Echo 7/19: Mild LVH, EF 60-65, no RWMA, mod ot severe MR, massive LAE, PASP 33 // TEE 7/19:  Mild conc LVH, EF 60-65, no RWMA, severe MR with mild post leaflet prolapse, massive //  s/p MV repair 07/2018  . On continuous oral anticoagulation 08/22/2015   Started on Xarelto 08/12/2015   . Osteopenia   . Persistent atrial fibrillation 08/22/2015   Started late August or early September 2016 // s/p Maze procedure 07/2018  . Radiation Therapy 04/21/16-05/19/16   left breast 47.72 Gy, boosted to 10 Gy  . 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 S/P Maze operation for atrial fibrillation 08/10/2018   Complete bilateral atrial lesion set using bipolar radiofrequency and cryothermy ablation with clipping of LA appendage  . S/P MVR (mitral valve repair) 08/10/2018   Complex valvuloplasty including Gore-tex neochord  placement x8, Plication of Lateral Commissure and 47m Sorin Memo 4D Ring Annuloplasty SN# [id number] . [place]cerative [place]   . Wears glasses     Past Surgical History:  Procedure Laterality Date  . BREAST BIOPSY Left 01/28/2016  . BREAST LUMPECTOMY Left 02/23/2016  . BREAST LUMPECTOMY WITH RADIOACTIVE SEED LOCALIZATION Left 02/23/2016   Procedure: BREAST LUMPECTOMY WITH RADIOACTIVE SEED LOCALIZATION;  Surgeon: BExcell Seltzer MD;  [place] [facility]  Service: General;  Laterality: Left;  . CARDIOVERSION N/A 10/02/2015   Procedure: CARDIOVERSION;  Surgeon: MJerline Pain MD;  [facility];  Service: Cardiovascular;  Laterality: N/A;  . CARDIOVERSION N/A 10/05/2018   Procedure: CARDIOVERSION;  Surgeon: RFay Records MD;  [place] [facility]  Service: Cardiovascular;  Laterality: N/A;  . CLIPPING OF ATRIAL APPENDAGE  08/10/2018   Procedure: CLIPPING OF ATRIAL APPENDAGE;  Surgeon: ORexene Alberts MD;  [place] [facility]  Service: Open Heart Surgery;;  . COLONOSCOPY    . CORONARY ARTERY BYPASS GRAFT N/A 08/10/2018   Procedure: CORONARY ARTERY BYPASS GRAFTING (CABG) x 1, LIMA-LAD,  USING LEFT INTERNAL MAMMARY ARTERY. HARVESTED RIGHT GREATER SAPHENOUS VEIN ENDOSCOPICALLY;  Surgeon: ORexene Alberts MD;  [place] [facility]  Service: Open Heart Surgery;  Laterality: N/A;  . DUPUYTREN CONTRACTURE RELEASE  2001   leftx2  . [place]  right  . DUPUYTREN CONTRACTURE RELEASE Right 05/02/2014   Procedure: EXCISION DUPUYTRENS RIGHT PALMAR/SMALL ;  Surgeon: RCammie Sickle MD;  [place] [facility]  Service: Orthopedics;  Laterality: Right;  .Marland KitchenMAZE N/A 08/10/2018   Procedure: MAZE;  Surgeon: ORexene Alberts MD;  [place] [facility]  Service: Open Heart Surgery;  Laterality: N/A;  . MITRAL VALVE REPAIR N/A 08/10/2018   Procedure: MITRAL VALVE REPAIR (MVR);  Surgeon: ORexene Alberts MD;  [facility] [facility]  Service: Open Heart  Surgery;  Laterality: N/A;  glutaraldehyde  . RIGHT/LEFT HEART CATH AND CORONARY ANGIOGRAPHY N/A 06/20/2018   Procedure: RIGHT/LEFT HEART CATH AND CORONARY ANGIOGRAPHY;  Surgeon: SBelva Crome MD;  [facility];  Service: Cardiovascular;  Laterality: N/A;  . TEE WITHOUT CARDIOVERSION N/A 06/20/2018   Procedure: TRANSESOPHAGEAL ECHOCARDIOGRAM (TEE);  Surgeon: HPixie Casino MD;  [facility];  Service: Cardiovascular;  Laterality: N/A;  . TEE WITHOUT CARDIOVERSION N/A 08/10/2018   Procedure: TRANSESOPHAGEAL ECHOCARDIOGRAM (TEE);  Surgeon: ORexene Alberts MD;  [place] [facility]  Service: Open Heart Surgery;  Laterality: N/A;  . TONSILLECTOMY  age 77    Current Outpatient Medications  Medication Sig Dispense Refill  . acetaminophen (TYLENOL) 325 MG tablet Take 2 tablets (650 mg total) by mouth every 6 (six) hours as needed for mild pain or fever.    .Marland Kitchenallopurinol (ZYLOPRIM) 100 MG tablet Take 200 mg by  mouth every evening.     [name] amiodarone (PACERONE) 200 MG tablet Take 1 tablet (200 mg total) by mouth daily. 30 tablet 11  . aspirin EC 81 MG EC tablet Take 1 tablet (81 mg total) by mouth daily.    . ciprofloxacin (CIPRO) 500 MG tablet Take 1 tablet (500 mg total) by mouth 2 (two) times daily. 6 tablet 0  . digoxin (LANOXIN) 0.125 MG tablet Take 0.0625 mg by mouth daily.    . furosemide (LASIX) 20 MG tablet Take 20 mg by mouth daily as needed (for swelling/fluid retention.).     [name] LIALDA 1.2 G EC tablet Take 4.8 g by mouth daily.     . metoprolol tartrate (LOPRESSOR) 100 MG tablet Take 1 tablet (100 mg total) by mouth 2 (two) times daily.    . potassium chloride SA (K-DUR,KLOR-CON) 20 MEQ tablet Take 1 tablet (20 mEq total) by mouth daily. 90 tablet 3  . simvastatin (ZOCOR) 20 MG tablet Take 20 mg by mouth every evening.     . traMADol (ULTRAM) 50 MG tablet Take 1 tablet (50 mg total) by mouth every 4 (four) hours as needed for moderate pain. 28 tablet 0  . warfarin  (COUMADIN) 1 MG tablet Take 1 tablet (1 mg total) by mouth daily at 6 PM. 30 tablet 3   No current facility-administered medications for this visit.     Allergies:   Shellfish allergy    Social History:  The patient  reports that she quit smoking about 25 years ago. Her smoking use included cigarettes. She has a 20.00 pack-year smoking history. She has never used smokeless tobacco. She reports that she drinks about 3.0 standard drinks of alcohol per week. She reports that she does not use drugs.   Family History:  The patient's family history includes Breast cancer in her sister; Breast cancer (age of onset: 58) in her mother; Cirrhosis in her mother; Heart attack in her father; Heart failure in her father; Kidney Stones in her sister.    ROS:  General:no colds or fevers, no weight changes Skin:no rashes or ulcers HEENT:no blurred vision, no congestion CV:see HPI PUL:see HPI GI:no diarrhea constipation or melena, no indigestion GU:no hematuria, no dysuria MS:no joint pain, no claudication Neuro:no syncope, occ lightheadedness Endo:no diabetes, no thyroid disease  Wt Readings from Last 3 Encounters:  10/26/18 122 lb 12.8 oz (55.7 kg)  09/29/18 119 lb 9.6 oz (54.3 kg)  09/27/18 120 lb 12.8 oz (54.8 kg)     PHYSICAL EXAM: VS:  BP 120/70   Pulse 84   Ht 5' 5"  (1.651 m)   Wt 122 lb 12.8 oz (55.7 kg)   BMI 20.43 kg/m  , BMI Body mass index is 20.43 kg/m. General:Pleasant affect, NAD Skin:Warm and dry, brisk capillary refill HEENT:normocephalic, sclera clear, mucus membranes moist Neck:supple, no JVD, no bruits  Heart:S1S2 RRR without murmur, gallup, rub or click Lungs:clear without rales, rhonchi, or wheezes EHO:ZYYQ, non tender, + BS, do not palpate liver spleen or masses Ext:no lower ext edema, 2+ pedal pulses, 2+ radial pulses Neuro:alert and oriented, MAE, follows commands, + facial symmetry    EKG:  EKG is ordered today. The ekg ordered today demonstrates A fib rate  controlled with inf last ischemia but this has been same since surgery  Recent Labs: 05/30/2018: NT-Pro BNP 5,122; TSH 1.200 08/16/2018: Magnesium 1.9 09/27/2018: ALT 14; BUN 14; Creatinine 0.87; Hemoglobin 11.4; Platelets 169; Potassium 4.4; Sodium 143    Lipid Panel No  results found for: CHOL, TRIG, HDL, CHOLHDL, VLDL, LDLCALC, LDLDIRECT     Other studies Reviewed: Additional studies/ records that were reviewed today include: . Echo 10/03/18 Left ventricle: The cavity size was normal. Systolic function was   normal. The estimated ejection fraction was in the range of 60%   to 65%. Wall motion was normal; there were no regional wall   motion abnormalities. - Aortic valve: There was no significant regurgitation. - Mitral valve: S/P MV repair. An annular ring prosthesis was   present. There was trivial perivalvular and trivial central   regurgitation. Mean gradient (D): 5 mm Hg. - Left atrium: The atrium was massively dilated. - Right ventricle: Systolic function was normal. - Atrial septum: No defect or patent foramen ovale was identified. - Tricuspid valve: There was mild-moderate regurgitation. Peak   RV-RA gradient (S): 37 mm Hg. - Pulmonic valve: There was mild regurgitation. - Pulmonary arteries: Systolic pressure was mildly to moderately   increased. - Pericardium, extracardiac: A trivial pericardial effusion was   identified posterior to the heart.  Impressions:  - S/P mitral valve annuloplasty with 28 mm Memo 4D ring. Normal LV   EF, trivial MR. Mild-moderate TR with RV-RA gradient of 37 mmHg.   Trivial posterior pericardial effusion.  OP NOTE 08/10/18 MITRAL VALVE REPAIR (MVR) -Complex Valvular Annuloplasty -59m Sorin Memo 4D Ring -Placement of 8 Gore-tex Neochords -Debridement of Annular Calcification -Plication of Anterior Leaflet P3  CORONARY ARTERY BYPASS GRAFTINGx 1 -Left Internal Mammary Artery to Left Anterior Descending Artery  ENDOSCOPIC  HARVEST GREATER SAPHENOUS VEIN -Right Thigh . TEE echo 08/10/18  Result status: Final result   Left atrium: Cavity is moderately to severely dilated. No spontaneous echo contrast.  Aortic valve: The valve is trileaflet. No stenosis. No regurgitation.  Mitral valve: Dilated mitral annulus. Mild leaflet calcification is present. Predominantly posterior moderate mitral annular calcification. Severe regurgitation. There is mild prolapse of the posterior mitral leaflet. Small flail portion of the posterior leaflet. There is a cleft in the posterior leaflet.  Right ventricle: Normal cavity size, wall thickness and ejection fraction.  Right atrium: Cavity is mildly dilated.  Tricuspid valve: Mild regurgitation.  Pulmonic valve: Trace regurgitation.    DOPPLERS  Final Interpretation: Right Carotid: Velocities in the right ICA are consistent with a 1-39% stenosis.  Left Carotid: Velocities in the left ICA are consistent with a 1-39% stenosis. Vertebrals: Bilateral vertebral arteries demonstrate antegrade flow. Subclavians: Normal flow hemodynamics were seen in bilateral subclavian       arteries.  Right Upper Extremity: Doppler waveforms remain within normal limits with right radial compression. Doppler waveforms remain within normal limits with right ulnar compression. Left Upper Extremity: Doppler waveforms remain within normal limits with left radial compression. Doppler waveforms remain within normal limits with left ulnar compression.  Cardiac cath 05/24/18  Severe mitral regurgitation with marked enlargement of the left atrium.  Mild pulmonary hypertension with mean pulmonary arterial pressure 28 mmHg. Phasic pressure 45/16 mmHg  Three-vessel coronary calcification with significant greater than 80% distal circumflex, 90% first diagonal ostium, and 80 to 90% mid LAD.  Normal LV systolic function with EF 50 to 55%. LVEDP 11 mmHg  Cardiac output 4.3 L/min/cardiac index  2.7 L/min/m  RECOMMENDATIONS:   Referred to Dr. CRemus Lofflerfor consideration of surgical mitral valve repair left atrial appendage ligation, consideration of Maze procedure, and coronary revascularization.     ASSESSMENT AND PLAN:  1.  Persistent a fib, was chronic prior to MV repair and  CABG X1 and Maze.  Has been cardioverted successfully but now back in a fib rate control.  She never felt she was out of a fib.  Continues on coumadin for now with plans after 3 months to change to Xarelto  --will discuss with [name] on rate control only with dig and lopressor and stop amiodarone or other plans- possible EP consult. --follow up with [name] in [place] or Feb. --plan for cardiac rehab  2.  S/p CABG X1, MV repair and MAZe procedure.    3.  HTN controlled continue meds  4.  HLD on statin.   5.  Dizziness off lasix very brief.  Monitor.    Current medicines are reviewed with the patient today.  The patient Has no concerns regarding medicines.  The following changes have been made:  See above Labs/ tests ordered today include:see above  Disposition:   FU:  see above  Signed, Cecilie Kicks, NP  10/26/2018 3:10 PM    [address], [place], [place], [place] Phone: [phone]; Fax: [phone]

## 2018-10-26 ENCOUNTER — Ambulatory Visit: Payer: Medicare Other | Admitting: Cardiology

## 2018-10-26 ENCOUNTER — Encounter: Payer: Self-pay | Admitting: Cardiology

## 2018-10-26 VITALS — BP 120/70 | HR 84 | Ht 65.0 in | Wt 122.8 lb

## 2018-10-26 DIAGNOSIS — Z9889 Other specified postprocedural states: Secondary | ICD-10-CM | POA: Diagnosis not present

## 2018-10-26 DIAGNOSIS — E785 Hyperlipidemia, unspecified: Secondary | ICD-10-CM

## 2018-10-26 DIAGNOSIS — I4819 Other persistent atrial fibrillation: Secondary | ICD-10-CM

## 2018-10-26 DIAGNOSIS — I10 Essential (primary) hypertension: Secondary | ICD-10-CM

## 2018-10-26 DIAGNOSIS — Z7901 Long term (current) use of anticoagulants: Secondary | ICD-10-CM | POA: Diagnosis not present

## 2018-10-26 DIAGNOSIS — Z951 Presence of aortocoronary bypass graft: Secondary | ICD-10-CM

## 2018-10-26 DIAGNOSIS — R42 Dizziness and giddiness: Secondary | ICD-10-CM

## 2018-10-26 NOTE — Patient Instructions (Signed)
Medication Instructions:  Your physician recommends that you continue on your current medications as directed. Please refer to the Current Medication list given to you today.  If you need a refill on your cardiac medications before your next appointment, please call your pharmacy.   Lab work: None ordered  If you have labs (blood work) drawn today and your tests are completely normal, you will receive your results only by: [name] MyChart Message (if you have MyChart) OR . A paper copy in the mail If you have any lab test that is abnormal or we need to change your treatment, we will call you to review the results.  Testing/Procedures: None ordered  Follow-Up: Your physician recommends that you schedule a follow-up appointment in: 12/29/18 ARRIVE AT 2:30 TO SEE [name]  Any Other Special Instructions Will Be Listed Below (If Applicable).

## 2018-10-31 ENCOUNTER — Telehealth: Payer: Self-pay | Admitting: *Deleted

## 2018-10-31 NOTE — Telephone Encounter (Signed)
-----   Message from Isaiah Serge, NP sent at 10/30/2018  3:19 PM EST ----- Please let ms Schwimmer know, I checked with [name] and we will leave in atrial fib.  She can stop her amiodarone but continue the rest of the meds.  Thanks.

## 2018-10-31 NOTE — Telephone Encounter (Signed)
Called pt re: message below. Pt aware to stop the Amiodarone and continue all other current medications and keep her f/u appt with [name] 12/2018. Pt thanked me for the call.

## 2018-11-03 ENCOUNTER — Ambulatory Visit: Payer: Medicare Other

## 2018-11-03 DIAGNOSIS — I340 Nonrheumatic mitral (valve) insufficiency: Secondary | ICD-10-CM

## 2018-11-03 DIAGNOSIS — I484 Atypical atrial flutter: Secondary | ICD-10-CM

## 2018-11-03 DIAGNOSIS — Z9889 Other specified postprocedural states: Secondary | ICD-10-CM

## 2018-11-03 DIAGNOSIS — Z7901 Long term (current) use of anticoagulants: Secondary | ICD-10-CM | POA: Diagnosis not present

## 2018-11-03 DIAGNOSIS — I4819 Other persistent atrial fibrillation: Secondary | ICD-10-CM

## 2018-11-03 LAB — POCT INR: INR: 1.7 — AB (ref 2.0–3.0)

## 2018-11-03 NOTE — Patient Instructions (Signed)
Description   Take 1.9ms today, then start taking 111mdaily except 1.76m14mn Mondays.  Recheck in 2 weeks. [facility] #[phone]#[phone]

## 2018-11-07 ENCOUNTER — Telehealth (HOSPITAL_COMMUNITY): Payer: Self-pay | Admitting: Pharmacist

## 2018-11-07 NOTE — Telephone Encounter (Signed)
Cardiac Rehab Medication Review by a Pharmacist  Does the patient  feel that his/her medications are working for him/her?  yes  Has the patient been experiencing any side effects to the medications prescribed?  no  Does the patient measure his/her own blood pressure or blood glucose at home?  no   Does the patient have any problems obtaining medications due to transportation or finances?   no  Understanding of regimen: good Understanding of indications: good Potential of compliance: good  Pharmacist comments: Patient doing well and has no complaints today. Patient is taking all medications as prescribed. No issues identified during conversation today.  Thank you for allowing pharmacy to be a part of this patient's care.  Leron Croak, PharmD PGY1 Pharmacy Resident Phone: [phone] 11/07/2018 9:22 AM

## 2018-11-17 ENCOUNTER — Ambulatory Visit: Payer: Medicare Other | Admitting: *Deleted

## 2018-11-17 DIAGNOSIS — I484 Atypical atrial flutter: Secondary | ICD-10-CM

## 2018-11-17 DIAGNOSIS — Z7901 Long term (current) use of anticoagulants: Secondary | ICD-10-CM | POA: Diagnosis not present

## 2018-11-17 DIAGNOSIS — I4819 Other persistent atrial fibrillation: Secondary | ICD-10-CM

## 2018-11-17 DIAGNOSIS — I340 Nonrheumatic mitral (valve) insufficiency: Secondary | ICD-10-CM

## 2018-11-17 DIAGNOSIS — Z9889 Other specified postprocedural states: Secondary | ICD-10-CM | POA: Diagnosis not present

## 2018-11-17 LAB — POCT INR: INR: 1.5 — AB (ref 2.0–3.0)

## 2018-11-17 NOTE — Patient Instructions (Signed)
Description   Take 1.94ms today and tomorrow, then start taking 114mdaily except 1.61m19mn Mondays, Wednesdays, and Fridays.  Recheck in 10 days. [facility] #[phone]#[phone]

## 2018-11-20 ENCOUNTER — Encounter: Payer: Self-pay | Admitting: Thoracic Surgery (Cardiothoracic Vascular Surgery)

## 2018-11-20 ENCOUNTER — Telehealth: Payer: Self-pay | Admitting: Interventional Cardiology

## 2018-11-20 ENCOUNTER — Other Ambulatory Visit: Payer: Self-pay

## 2018-11-20 ENCOUNTER — Ambulatory Visit: Payer: Medicare Other | Admitting: Thoracic Surgery (Cardiothoracic Vascular Surgery)

## 2018-11-20 VITALS — BP 114/73 | HR 105 | Resp 16 | Ht 65.0 in | Wt 120.0 lb

## 2018-11-20 DIAGNOSIS — I4819 Other persistent atrial fibrillation: Secondary | ICD-10-CM | POA: Diagnosis not present

## 2018-11-20 DIAGNOSIS — Z9889 Other specified postprocedural states: Secondary | ICD-10-CM | POA: Diagnosis not present

## 2018-11-20 NOTE — Patient Instructions (Signed)
Continue all previous medications without any changes at this time  It would be reasonable to stop warfarin and resume Xarelto when okay with [name].  You may resume unrestricted physical activity without any particular limitations at this time.  Endocarditis is a potentially serious infection of heart valves or inside lining of the heart.  It occurs more commonly in patients with diseased heart valves (such as patient's with aortic or mitral valve disease) and in patients who have undergone heart valve repair or replacement.  Certain surgical and dental procedures may put you at risk, such as dental cleaning, other dental procedures, or any surgery involving the respiratory, urinary, gastrointestinal tract, gallbladder or prostate gland.   To minimize your chances for develooping endocarditis, maintain good oral health and seek prompt medical attention for any infections involving the mouth, teeth, gums, skin or urinary tract.    Always notify your doctor or dentist about your underlying heart valve condition before having any invasive procedures. You will need to take antibiotics before certain procedures, including all routine dental cleanings or other dental procedures.  Your cardiologist or dentist should prescribe these antibiotics for you to be taken ahead of time.

## 2018-11-20 NOTE — Progress Notes (Signed)
[address]       [place] [address]             [phone]     CARDIOTHORACIC SURGERY OFFICE NOTE  Referring Provider is [name], MD PCP is [name], MD   HPI:  Patient is a 77 year old female with history of mitral regurgitation, long-standing persistent atrial fibrillation on oral anticoagulation, hypertension, chronic diastolic congestive heart failure, previous spine surgery for management ofcompression fracture of the lumbar spine, and remote history of breast cancer whoreturns to the office today for routine follow-up status post mitral valve repair, coronary artery bypass grafting x1, and Maze procedure on August 10, 2018.  Her early postoperative recovery was uncomplicated with exception of some postoperative atypical atrial flutter.  She was started on amiodarone and anticoagulated using warfarin.  She was eventually discharged home on the sixth postoperative day.  She was last seen in our office on September 11, 2018 at which time she was doing well although she remained in atrial flutter.  Follow-up echocardiogram performed October 03, 2018 revealed normal left ventricular systolic function with ejection fraction estimated 60 to 65%.  The mitral valve repair appeared intact with trivial residual paravalvular and central regurgitation.  There was massive left atrial enlargement.  There was mild to moderate tricuspid regurgitation.  She underwent DC cardioversion back to sinus rhythm on October 05, 2018.  She was seen again in follow-up by Cecilie Kicks at [facility] on October 26, 2018 at which time she was reportedly back in rate controlled atrial fibrillation.  Amiodarone was stopped.  She returns her office today and reports that she is feeling quite well.  She states that she feels as though she is in rhythm most of the time but occasionally her heart is out of rhythm with elevated heart rate and decreased exercise tolerance.  Currently  she feels very well.  She denies any shortness of breath.  She does not have any soreness in her chest.  She remains anticoagulated using warfarin and is hopeful that she can return to using Xarelto.   Current Outpatient Medications  Medication Sig Dispense Refill  . acetaminophen (TYLENOL) 325 MG tablet Take 2 tablets (650 mg total) by mouth every 6 (six) hours as needed for mild pain or fever.    [name] allopurinol (ZYLOPRIM) 100 MG tablet Take 200 mg by mouth every evening.     [name] aspirin EC 81 MG EC tablet Take 1 tablet (81 mg total) by mouth daily.    . digoxin (LANOXIN) 0.125 MG tablet Take 0.0625 mg by mouth daily.    . furosemide (LASIX) 20 MG tablet Take 20 mg by mouth daily as needed (for swelling/fluid retention.).     [name] LIALDA 1.2 G EC tablet Take 4.8 g by mouth daily.     . metoprolol tartrate (LOPRESSOR) 100 MG tablet Take 1 tablet (100 mg total) by mouth 2 (two) times daily.    . potassium chloride SA (K-DUR,KLOR-CON) 20 MEQ tablet Take 1 tablet (20 mEq total) by mouth daily. 90 tablet 3  . simvastatin (ZOCOR) 20 MG tablet Take 20 mg by mouth every evening.     . traMADol (ULTRAM) 50 MG tablet Take 1 tablet (50 mg total) by mouth every 4 (four) hours as needed for moderate pain. 28 tablet 0  . warfarin (COUMADIN) 1 MG tablet Take 1 tablet (1 mg total) by mouth daily at 6 PM. 30 tablet 3   No  current facility-administered medications for this visit.       Physical Exam:   BP 114/73 (BP Location: Right Arm, Patient Position: Sitting, Cuff Size: Normal)   Pulse (!) 105   Resp 16   Ht 5' 5"  (1.651 m)   Wt 120 lb (54.4 kg)   SpO2 98% Comment: ON RA  BMI 19.97 kg/m   General:  Well-appearing  Chest:   Clear to auscultation  CV:   Regular rate and rhythm without murmur  Incisions:  Well-healed  Abdomen:  Soft nontender  Extremities:  Warm and well-perfused, no edema  Diagnostic Tests:  Transthoracic Echocardiography  Patient:    Michele Green, Michele Green MR #:        [id number] Study Date: 10/03/2018 Gender:     F Age:        49 Height:     165.1 cm Weight:     54.3 kg BSA:        1.57 m^2 Pt. Status: Room:   ATTENDING    Carylon Perches  REFERRING    Cecilie Kicks R  SONOGRAPHER  Cindy Hazy, RDCS  PERFORMING   [facility], Outpatient  cc:  ------------------------------------------------------------------- LV EF: 60% -   65%  ------------------------------------------------------------------- Indications:      I05.9 Mitral valve disorder.  ------------------------------------------------------------------- History:   PMH:  Acquired from the patient and from the patient&'s chart.  PMH:  Atrial flutter. Coronary artery disease. Atrial fibrillation.  Risk factors:  Hypertension.  ------------------------------------------------------------------- Study Conclusions  - Left ventricle: The cavity size was normal. Systolic function was   normal. The estimated ejection fraction was in the range of 60%   to 65%. Wall motion was normal; there were no regional wall   motion abnormalities. - Aortic valve: There was no significant regurgitation. - Mitral valve: S/P MV repair. An annular ring prosthesis was   present. There was trivial perivalvular and trivial central   regurgitation. Mean gradient (D): 5 mm Hg. - Left atrium: The atrium was massively dilated. - Right ventricle: Systolic function was normal. - Atrial septum: No defect or patent foramen ovale was identified. - Tricuspid valve: There was mild-moderate regurgitation. Peak   RV-RA gradient (S): 37 mm Hg. - Pulmonic valve: There was mild regurgitation. - Pulmonary arteries: Systolic pressure was mildly to moderately   increased. - Pericardium, extracardiac: A trivial pericardial effusion was   identified posterior to the heart.  Impressions:  - S/P mitral valve annuloplasty with 28 mm Memo 4D ring. Normal LV   EF, trivial MR. Mild-moderate  TR with RV-RA gradient of 37 mmHg.   Trivial posterior pericardial effusion.  ------------------------------------------------------------------- Labs, prior tests, procedures, and surgery: Status post Mitral valve replacement-28 mm Sorin Memo 4D Ring Annuloplasty.  Coronary artery bypass grafting.  ------------------------------------------------------------------- Study data:   Study status:  Routine.  Procedure:  The patient reported no pain pre or post test. Transthoracic echocardiography for left ventricular function evaluation, for right ventricular function evaluation, and for assessment of valvular function. Image quality was adequate.  Study completion:  There were no complications.          Transthoracic echocardiography.  M-mode, complete 2D, spectral Doppler, and color Doppler.  Birthdate: Patient birthdate: 09/16/1941.  Age:  Patient is 77 yr old.  Sex: Gender: female.    BMI: 19.9 kg/m^2.  Blood pressure:     120/70 Patient status:  Outpatient.  Study date:  Study date: 10/03/2018. Study time: 04:13 PM.  Location:  [facility]  -------------------------------------------------------------------  ------------------------------------------------------------------- Left ventricle:  The cavity size was normal. Systolic function was normal. The estimated ejection fraction was in the range of 60% to 65%. Wall motion was normal; there were no regional wall motion abnormalities.  ------------------------------------------------------------------- Aortic valve:   Trileaflet; mildly thickened leaflets. Mobility was not restricted.  Doppler:  Transvalvular velocity was within the normal range. There was no stenosis. There was no significant regurgitation.  ------------------------------------------------------------------- Aorta:  Aortic root: The aortic root was normal in size.  ------------------------------------------------------------------- Mitral valve:   S/P MV repair. An annular ring prosthesis was present. Leaflet separation was normal. Mobility was not restricted.  Doppler:  Transvalvular velocity was within the normal range. There was no evidence for stenosis. There was trivial perivalvular and trivial central regurgitation.    Valve area by pressure half-time: 2.5 cm^2. Indexed valve area by pressure half-time: 1.59 cm^2/m^2. Valve area by continuity equation (using LVOT flow): 1.7 cm^2. Indexed valve area by continuity equation (using LVOT flow): 1.08 cm^2/m^2.    Mean gradient (D): 5 mm Hg. Peak gradient (D): 8 mm Hg.  ------------------------------------------------------------------- Left atrium:  The atrium was massively dilated.  ------------------------------------------------------------------- Atrial septum:  No defect or patent foramen ovale was identified.   ------------------------------------------------------------------- Right ventricle:  The cavity size was normal. Wall thickness was normal. Systolic function was normal.  ------------------------------------------------------------------- Pulmonic valve:   Poorly visualized.  The valve appears to be grossly normal.    Doppler:  Transvalvular velocity was within the normal range. There was no evidence for stenosis. There was mild regurgitation.  ------------------------------------------------------------------- Tricuspid valve:   Structurally normal valve.    Doppler: Transvalvular velocity was within the normal range. There was mild-moderate regurgitation.  ------------------------------------------------------------------- Pulmonary artery:   The main pulmonary artery was normal-sized. Systolic pressure was mildly to moderately increased.  ------------------------------------------------------------------- Right atrium:  The atrium was normal in size.  ------------------------------------------------------------------- Pericardium:  A trivial  pericardial effusion was identified posterior to the heart.  ------------------------------------------------------------------- Systemic veins: Inferior vena cava: Not well visualized.  ------------------------------------------------------------------- Measurements   Left ventricle                           Value          Reference  LV ID, ED, PLAX chordal          (L)     42    mm       43 - 52  LV ID, ES, PLAX chordal                  25    mm       23 - 38  LV fx shortening, PLAX chordal           40    %        >=29  LV PW thickness, ED                      9     mm       ----------  IVS/LV PW ratio, ED                      1.11           <=1.3  Stroke volume, 2D                        56  ml       ----------  Stroke volume/bsa, 2D                    36    ml/m^2   ----------  LV e&', lateral                           10.8  cm/s     ----------  LV E/e&', lateral                         12.87          ----------  LV e&', medial                            7.79  cm/s     ----------  LV E/e&', medial                          17.84          ----------  LV e&', average                           9.3   cm/s     ----------  LV E/e&', average                         14.95          ----------    Ventricular septum                       Value          Reference  IVS thickness, ED                        10    mm       ----------    LVOT                                     Value          Reference  LVOT ID, S                               20    mm       ----------  LVOT area                                3.14  cm^2     ----------  LVOT ID                                  20    mm       ----------  LVOT peak velocity, S                    97.9  cm/s     ----------  LVOT mean velocity, S                    68.2  cm/s     ----------  LVOT VTI, S                              17.7  cm       ----------  Stroke volume (SV), LVOT DP              55.6  ml       ----------  Stroke index  (SV/bsa), LVOT DP           35.3  ml/m^2   ----------    Aorta                                    Value          Reference  Aortic root ID, ED                       32    mm       ----------    Left atrium                              Value          Reference  LA ID, A-P, ES                           53    mm       ----------  LA ID/bsa, A-P                   (H)     3.37  cm/m^2   <=2.2  LA volume, S                             88.1  ml       ----------  LA volume/bsa, S                         56    ml/m^2   ----------  LA volume, ES, 1-p A4C                   65.2  ml       ----------  LA volume/bsa, ES, 1-p A4C               41.4  ml/m^2   ----------  LA volume, ES, 1-p A2C                   109   ml       ----------  LA volume/bsa, ES, 1-p A2C               69.3  ml/m^2   ----------    Mitral valve                             Value          Reference  Mitral E-wave peak velocity              139   cm/s     ----------  Mitral mean velocity, D                  104   cm/s     ----------  Mitral deceleration time         (H)     401   ms       150 - 230  Mitral pressure half-time                94    ms       ----------  Mitral mean gradient, D                  5     mm Hg    ----------  Mitral peak gradient, D                  8     mm Hg    ----------  Mitral valve area, PHT, DP               2.5   cm^2     ----------  Mitral valve area/bsa, PHT, DP           1.59  cm^2/m^2 ----------  Mitral valve area, LVOT                  1.7   cm^2     ----------  continuity  Mitral valve area/bsa, LVOT              1.08  cm^2/m^2 ----------  continuity  Mitral annulus VTI, D                    32.7  cm       ----------    Tricuspid valve                          Value          Reference  Tricuspid regurg peak velocity           305   cm/s     ----------  Tricuspid peak RV-RA gradient            37    mm Hg    ----------    Right atrium                             Value          Reference   RA ID, S-I, ES, A4C                      47.7  mm       34 - 49  RA area, ES, A4C                         14.7  cm^2     8.3 - 19.5  RA volume, ES, A/L                       37.6  ml       ----------  RA volume/bsa, ES, A/L                   23.9  ml/m^2   ----------    Right ventricle                          Value          Reference  RV ID, minor axis, ED, A4C base  36    mm       ----------  RV s&', lateral, S                        7.57  cm/s     ----------  Legend: (L)  and  (H)  mark values outside specified reference range.  ------------------------------------------------------------------- Prepared and Electronically Authenticated by  Buford Dresser 2019-11-13T07:40:05    2 channel telemetry rhythm strip demonstrates what appears to be atypical atrial flutter with heart rate 100   Impression:  Patient appears to be clinically doing well approximately 3 months status post mitral valve repair, coronary artery bypass grafting, and Maze procedure.  Currently the patient's rhythm is regular and narrow complex.  2 channel telemetry rhythm strip appears consistent with atypical atrial flutter with controlled heart rate.  The patient currently remains anticoagulated using warfarin.   Plan:  I think it would be reasonable for the patient to resume Xarelto for long-term anticoagulation.  I have asked the patient to contact Dr. Thompson Caul office to make sure that he agrees with this plan.  Otherwise we have not recommended any change to the patient's current medications.  Because the patient does report intermittent episodes of tachycardia, at some point it might be reasonable to consider an event monitor and/or referral to the EP service to consider catheter-based ablation for atypical atrial flutter.  We have not made any other recommendations at this time.  The patient will continue to follow-up regularly with [name] and return to our office for routine follow-up and  rhythm check next September.  She will call and return sooner should specific problems or questions arise.  I spent in excess of 15 minutes during the conduct of this office consultation and >50% of this time involved direct face-to-face encounter with the patient for counseling and/or coordination of their care.    Valentina Gu. Roxy Manns, MD 11/20/2018 12:48 PM

## 2018-11-20 NOTE — Telephone Encounter (Signed)
This decision will need to be made by Dr Tamala Julian, not anticoag clinic. Forwarded for review.

## 2018-11-20 NOTE — Progress Notes (Signed)
Michele Green 77 y.o. female DOB [birth year] MRN [id number]       Nutrition  No diagnosis found. Past Medical History:  Diagnosis Date  . Atypical atrial flutter ([place]) 08/14/2018   Post-operative  . Breast cancer of upper-outer quadrant of left female breast ([place]) 02/02/2016  . CAD (coronary artery disease) 06/20/2018   LHC 7/19: pLAD 65/90, oD1 90, mLCx 85, OM2 50, oRCA 30, EF 50-55 >> s/p CABG in 9/19 (L-LAD)  . Colon polyp 02/2012  . Dupuytren contracture    bilateral hands  . Family history of breast cancer   . Hypertension   . Mitral regurgitation 11/07/2013   Echo 7/19: Mild LVH, EF 60-65, no RWMA, mod ot severe MR, massive LAE, PASP 33 // TEE 7/19:  Mild conc LVH, EF 60-65, no RWMA, severe MR with mild post leaflet prolapse, massive // s/p MV repair 07/2018  . On continuous oral anticoagulation 08/22/2015   Started on Xarelto 08/12/2015   . Osteopenia   . Persistent atrial fibrillation 08/22/2015   Started late August or early September 2016 // s/p Maze procedure 07/2018  . Radiation Therapy 04/21/16-05/19/16   left breast 47.72 Gy, boosted to 10 Gy  . 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 S/P Maze operation for atrial fibrillation 08/10/2018   Complete bilateral atrial lesion set using bipolar radiofrequency and cryothermy ablation with clipping of LA appendage  . S/P MVR (mitral valve repair) 08/10/2018   Complex valvuloplasty including Gore-tex neochord placement x8, Plication of Lateral Commissure and 61m Sorin Memo 4D Ring Annuloplasty SN# [id number] . Ulcerative colitis ([facility]ves   . Wears glasses    Meds reviewed.     Current Outpatient Medications (Cardiovascular):  .  digoxin (LANOXIN) 0.125 MG tablet, Take 0.0625 mg by mouth daily. .  furosemide (LASIX) 20 MG tablet, Take 20 mg by mouth daily as needed (for swelling/fluid retention.).  .[name] metoprolol tartrate (LOPRESSOR) 100 MG tablet, Take 1 tablet (100 mg total) by mouth 2 (two) times daily. .  simvastatin (ZOCOR) 20  MG tablet, Take 20 mg by mouth every evening.    Current Outpatient Medications (Analgesics):  .  acetaminophen (TYLENOL) 325 MG tablet, Take 2 tablets (650 mg total) by mouth every 6 (six) hours as needed for mild pain or fever. .[name] allopurinol (ZYLOPRIM) 100 MG tablet, Take 200 mg by mouth every evening.  .[name] aspirin EC 81 MG EC tablet, Take 1 tablet (81 mg total) by mouth daily. .  traMADol (ULTRAM) 50 MG tablet, Take 1 tablet (50 mg total) by mouth every 4 (four) hours as needed for moderate pain. (Patient not taking: Reported on 11/07/2018)  Current Outpatient Medications (Hematological):  .  warfarin (COUMADIN) 1 MG tablet, Take 1 tablet (1 mg total) by mouth daily at 6 PM.  Current Outpatient Medications (Other):  .[name] LIALDA 1.2 G EC tablet, Take 4.8 g by mouth daily.  .  potassium chloride SA (K-DUR,KLOR-CON) 20 MEQ tablet, Take 1 tablet (20 mEq total) by mouth daily.   HT: Ht Readings from Last 1 Encounters:  10/26/18 5' 5"  (1.651 m)    WT: Wt Readings from Last 5 Encounters:  10/26/18 122 lb 12.8 oz (55.7 kg)  09/29/18 119 lb 9.6 oz (54.3 kg)  09/27/18 120 lb 12.8 oz (54.8 kg)  09/12/18 120 lb 6.4 oz (54.6 kg)  09/11/18 117 lb (53.1 kg)     BMI= 20.44 (10/26/18)  Current tobacco use? No  Labs:  Lipid Panel  No results found for: CHOL, TRIG, HDL, CHOLHDL, VLDL, LDLCALC, LDLDIRECT  Lab Results  Component Value Date   HGBA1C 4.6 (L) 08/07/2018   CBG (last 3)  No results for input(s): GLUCAP in the last 72 hours.  Nutrition Diagnosis ? Food-and nutrition-related knowledge deficit related to lack of exposure to information as related to diagnosis of: ? CVD ? CHF  Nutrition Goal(s):  ? To be determined  Plan:  Pt to attend nutrition classes ? Nutrition I ? Nutrition II ? Portion Distortion  Will provide client-centered nutrition education as part of interdisciplinary care.   Monitor and evaluate progress toward nutrition goal with team.  Laurina Bustle, MS,  RD, LDN 11/20/2018 10:44 AM

## 2018-11-20 NOTE — Telephone Encounter (Signed)
New Message   Pt c/o medication issue:  1. Name of Medication: Coumadin  2. How are you currently taking this medication (dosage and times per day)? Doesn't know the dosage and takes 1 per day  3. Are you having a reaction (difficulty breathing--STAT)? No   4. What is your medication issue? Patient wants to stop taking the medication and Dr. Roxy Manns her surgeon said it would be okay if [name] approves.

## 2018-11-21 ENCOUNTER — Encounter (HOSPITAL_COMMUNITY)
Admission: RE | Admit: 2018-11-21 | Discharge: 2018-11-21 | Disposition: A | Discharge status: Home / Self Care | Payer: Medicare Other | Source: Ambulatory Visit | Attending: Interventional Cardiology | Admitting: Interventional Cardiology

## 2018-11-21 ENCOUNTER — Encounter (HOSPITAL_COMMUNITY): Payer: Self-pay

## 2018-11-21 VITALS — BP 104/62 | HR 95 | Ht 63.0 in | Wt 123.7 lb

## 2018-11-21 DIAGNOSIS — Z9889 Other specified postprocedural states: Secondary | ICD-10-CM

## 2018-11-21 DIAGNOSIS — Z951 Presence of aortocoronary bypass graft: Secondary | ICD-10-CM | POA: Diagnosis not present

## 2018-11-21 HISTORY — DX: Hyperlipidemia, unspecified: E78.5

## 2018-11-21 HISTORY — DX: Cardiac arrhythmia, unspecified: I49.9

## 2018-11-21 NOTE — Telephone Encounter (Signed)
She was in atrial flutter at Dr. Guy Sandifer office yesterday.  They discussed possibly switching from Coumadin to Xarelto.  I have no problem with switching to Xarelto but she needs to remain on chronic anticoagulation therapy given mitral valve disease, large left atrium, and risk of stroke

## 2018-11-21 NOTE — Progress Notes (Signed)
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  acetaminophen (TYLENOL) 325 MG tablet, Take 2 tablets (650 mg total) by mouth every 6 (six) hours as needed for mild pain or fever., Disp: , Rfl:  .  allopurinol (ZYLOPRIM) 100 MG tablet, Take 200 mg by mouth every evening. , Disp: , Rfl:  .  aspirin EC 81 MG EC tablet, Take 1 tablet (81 mg total) by mouth daily., Disp: , Rfl:  .  digoxin (LANOXIN) 0.125 MG tablet, Take 0.0625 mg by mouth daily., Disp: , Rfl:  .  furosemide (LASIX) 20 MG tablet, Take 20 mg by mouth daily as needed (for swelling/fluid retention.). , Disp: , Rfl:  .  LIALDA 1.2 G EC tablet, Take 4.8 g by mouth daily. , Disp: , Rfl:  .  metoprolol tartrate (LOPRESSOR) 100 MG tablet, Take 1 tablet (100 mg total) by mouth 2 (two) times daily., Disp: , Rfl:  .  potassium chloride SA (K-DUR,KLOR-CON) 20 MEQ tablet, Take 1 tablet (20 mEq total) by mouth daily., Disp: 90 tablet, Rfl: 3 .  simvastatin (ZOCOR) 20 MG tablet, Take 20 mg by mouth every evening. , Disp: , Rfl:  .  traMADol (ULTRAM) 50 MG tablet, Take 1 tablet (50 mg total) by mouth every 4 (four) hours as needed for moderate pain., Disp: 28 tablet, Rfl: 0 .  warfarin (COUMADIN) 1 MG tablet, Take 1 tablet (1 mg total) by mouth daily at 6 PM., Disp: 30 tablet, Rfl: 3  Past Medical History: Past Medical History:  Diagnosis Date  . Arrhythmia   .  Atypical atrial flutter ([place]) 08/14/2018   Post-operative  . Breast cancer of upper-outer quadrant of left female breast ([place]) 02/02/2016  . CAD (coronary artery disease) 06/20/2018   LHC 7/19: pLAD 65/90, oD1 90, mLCx 85, OM2 50, oRCA 30, EF 50-55 >> s/p CABG in 9/19 (L-LAD)  . Colon polyp 02/2012  . Dupuytren contracture    bilateral hands  . Family history of breast cancer   . Hyperlipidemia   . Hypertension   . Mitral regurgitation 11/07/2013   Echo 7/19: Mild LVH, EF 60-65, no RWMA, mod ot severe MR, massive LAE, PASP 33 // TEE 7/19:  Mild conc LVH, EF 60-65, no RWMA, severe MR with mild post leaflet prolapse, massive // s/p MV repair 07/2018  . On continuous oral anticoagulation 08/22/2015   Started on Xarelto 08/12/2015   . Osteopenia   . Persistent atrial fibrillation 08/22/2015   Started late August or early September 2016 // s/p Maze procedure 07/2018  . Radiation Therapy 04/21/16-05/19/16   left breast 47.72 Gy, boosted to 10 Gy  . 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 S/P Maze operation for atrial fibrillation 08/10/2018   Complete bilateral atrial lesion set using bipolar radiofrequency and cryothermy ablation with clipping of LA appendage  . 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 [place]cerative [place]   . Wears glasses     Tobacco Use: Social History   Tobacco Use  Smoking Status Former Smoker  . Packs/day: 1.00  . Years: 20.00  . Pack years: 20.00  . Types: Cigarettes  . Last attempt to quit: 11/22/1992  . Years since quitting: 26.0  Smokeless Tobacco Never Used    Labs: Recent RChemical engineer   Labs for ITP Cardiac and Pulmonary Rehab Latest Ref Rng & Units 08/10/2018 08/10/2018 08/10/2018 08/10/2018 08/11/2018   Hemoglobin A1c 4.8 - 5.6 % - - - - -   PHART 7.350 - 7.450 7.305(L) 7.348(L) 7.314(L) - -    PCO2ART 32.0 - 48.0 mmHg 42.3 32.8 42.9 - -   HCO3 20.0 - 28.0 mmol/L 21.5 18.0(L) 21.8 - -   TCO2 22 - 32 mmol/L 23 19(L) 23 23 24    ACIDBASEDEF 0.0 - 2.0 mmol/L 5.0(H) 7.0(H) 4.0(H) - -   O2SAT % 98.0 94.0 98.0 - -      Capillary Blood Glucose: Lab Results  Component Value Date   GLUCAP 88 08/14/2018   GLUCAP 94 08/13/2018   GLUCAP 87 08/13/2018   GLUCAP 101 (H) 08/13/2018   GLUCAP 103 (H) 08/13/2018     Exercise Target Goals: Exercise Program Goal: Individual exercise prescription set using results from initial 6 min walk test and THRR while considering  patient's activity barriers and safety.   Exercise Prescription Goal: Starting with aerobic activity 30 plus minutes a day, 3 days per week for initial exercise prescription. Provide home exercise prescription and guidelines that participant acknowledges understanding prior to discharge.  Activity Barriers & Risk Stratification: Activity Barriers & Cardiac Risk Stratification - 11/21/18 1351      Activity Barriers & Cardiac Risk Stratification   Activity Barriers  Back Problems    Cardiac Risk Stratification  High       6 Minute Walk: 6 Minute Walk    Row Name 11/21/18 1400         6 Minute Walk   Phase  Initial     Distance  1303 feet     Walk Time  6 minutes     # of Rest Breaks  0     MPH  2.47     METS  2.69     RPE  9     Perceived Dyspnea   0     VO2 Peak  9.43     Symptoms  No     Resting HR  95 bpm     Resting BP  104/62     Resting Oxygen Saturation   95 %     Exercise Oxygen Saturation  during 6 min walk  93 %     Max Ex. HR  119 bpm     Max Ex. BP  118/80     2 Minute Post BP  104/62        Oxygen Initial Assessment:   Oxygen Re-Evaluation:   Oxygen Discharge (Final Oxygen Re-Evaluation):   Initial Exercise Prescription: Initial Exercise Prescription - 11/21/18 1500      Date of Initial Exercise RX and Referring Provider   Date  11/21/18    Referring Provider  SBelva Crome,  MD    Expected Discharge Date  02/26/19      Recumbant Bike  Level  2    Watts  15    Minutes  10    METs  2.8      NuStep   Level  2    SPM  85    Minutes  10    METs  2.5      Track   Laps  9    Minutes  10    METs  2.57      Prescription Details   Frequency (times per week)  2    Duration  Progress to 30 minutes of continuous aerobic without signs/symptoms of physical distress      Intensity   THRR 40-80% of Max Heartrate  56-113    Ratings of Perceived Exertion  11-13    Perceived Dyspnea  0-4      Progression   Progression  Continue to progress workloads to maintain intensity without signs/symptoms of physical distress.      Resistance Training   Training Prescription  Yes    Weight  2lbs    Reps  10-15       Perform Capillary Blood Glucose checks as needed.  Exercise Prescription Changes:   Exercise Comments:   Exercise Goals and Review:  Exercise Goals    Row Name 11/21/18 1352             Exercise Goals   Increase Physical Activity  Yes       Intervention  Provide advice, education, support and counseling about physical activity/exercise needs.;Develop an individualized exercise prescription for aerobic and resistive training based on initial evaluation findings, risk stratification, comorbidities and participant's personal goals.       Expected Outcomes  Short Term: Attend rehab on a regular basis to increase amount of physical activity.;Long Term: Add in home exercise to make exercise part of routine and to increase amount of physical activity.;Long Term: Exercising regularly at least 3-5 days a week.       Increase Strength and Stamina  Yes       Intervention  Provide advice, education, support and counseling about physical activity/exercise needs.;Develop an individualized exercise prescription for aerobic and resistive training based on initial evaluation findings, risk stratification, comorbidities and participant's personal goals.        Expected Outcomes  Short Term: Increase workloads from initial exercise prescription for resistance, speed, and METs.;Short Term: Perform resistance training exercises routinely during rehab and add in resistance training at home;Long Term: Improve cardiorespiratory fitness, muscular endurance and strength as measured by increased METs and functional capacity (6MWT)       Able to understand and use rate of perceived exertion (RPE) scale  Yes       Intervention  Provide education and explanation on how to use RPE scale       Expected Outcomes  Short Term: Able to use RPE daily in rehab to express subjective intensity level;Long Term:  Able to use RPE to guide intensity level when exercising independently       Knowledge and understanding of Target Heart Rate Range (THRR)  Yes       Intervention  Provide education and explanation of THRR including how the numbers were predicted and where they are located for reference       Expected Outcomes  Short Term: Able to state/look up THRR;Long Term: Able to use THRR to govern intensity when exercising independently;Short Term: Able to use daily as guideline for intensity in rehab       Understanding of Exercise  Prescription  Yes       Intervention  Provide education, explanation, and written materials on patient's individual exercise prescription       Expected Outcomes  Short Term: Able to explain program exercise prescription;Long Term: Able to explain home exercise prescription to exercise independently          Exercise Goals Re-Evaluation :    Discharge Exercise Prescription (Final Exercise Prescription Changes):   Nutrition:  Target Goals: Understanding of nutrition guidelines, daily intake of sodium <1538m, cholesterol <201m calories 30% from fat and 7% or less from saturated fats, daily to have 5 or more servings of fruits and vegetables.  Biometrics: Pre Biometrics - 11/21/18 1331      Pre Biometrics   Height  5' 3"  (1.6 m)    Weight   56.1 kg    Waist Circumference  31.75 inches    Hip Circumference  36.5 inches    Waist to Hip Ratio  0.87 %    BMI (Calculated)  21.91    Triceps Skinfold  11 mm    % Body Fat  31 %    Grip Strength  24.5 kg    Flexibility  --   not performed: back issues    Single Leg Stand  4.28 seconds        Nutrition Therapy Plan and Nutrition Goals:   Nutrition Assessments: Nutrition Assessments - 11/21/18 1347      MEDFICTS Scores   Pre Score  6       Nutrition Goals Re-Evaluation:   Nutrition Goals Discharge (Final Nutrition Goals Re-Evaluation):   Psychosocial: Target Goals: Acknowledge presence or absence of significant depression and/or stress, maximize coping skills, provide positive support system. Participant is able to verbalize types and ability to use techniques and skills needed for reducing stress and depression.  Initial Review & Psychosocial Screening: Initial Psych Review & Screening - 11/21/18 1502      Initial Review   Current issues with  Current Stress Concerns    Source of Stress Concerns  Family    Comments  Pat's husband passed away last year from brain [place]   PaFraser Dinas her sister for support in town and a son who lives 2 hours away     Barriers   Psychosocial barriers to participate in program  The patient should benefit from training in stress management and relaxation.      Screening Interventions   Interventions  Encouraged to exercise    Expected Outcomes  Long Term Goal: Stressors or current issues are controlled or eliminated.       Quality of Life Scores: Quality of Life - 11/21/18 1513      Quality of Life   Select  Quality of Life      Quality of Life Scores   Health/Function Pre  28.5 %    Socioeconomic Pre  30 %    Psych/Spiritual Pre  30 %    Family Pre  30 %    GLOBAL Pre  29.4 %      Scores of 19 and below usually indicate a poorer quality of life in these areas.  A  difference of  2-3 points is a clinically meaningful difference.  A difference of 2-3 points in the total score of the Quality of Life Index has been associated with significant improvement in overall quality of life, self-image, physical symptoms, and general health in  studies assessing change in quality of life.  PHQ-9: Recent Review Flowsheet Data    Depression screen [facility] 2/9 12/30/2016 06/24/2016   Decreased Interest 0 0   Down, Depressed, Hopeless - 0   PHQ - 2 Score 0 0     Interpretation of Total Score  Total Score Depression Severity:  1-4 = Minimal depression, 5-9 = Mild depression, 10-14 = Moderate depression, 15-19 = Moderately severe depression, 20-27 = Severe depression   Psychosocial Evaluation and Intervention:   Psychosocial Re-Evaluation:   Psychosocial Discharge (Final Psychosocial Re-Evaluation):   Vocational Rehabilitation: Provide vocational rehab assistance to qualifying candidates.   Vocational Rehab Evaluation & Intervention: Vocational Rehab - 11/21/18 1515      Initial Vocational Rehab Evaluation & Intervention   Assessment shows need for Vocational Rehabilitation  No       Education: Education Goals: Education classes will be provided on a weekly basis, covering required topics. Participant will state understanding/return demonstration of topics presented.  Learning Barriers/Preferences: Learning Barriers/Preferences - 11/21/18 1509      Learning Barriers/Preferences   Learning Barriers  Sight   glasses   Learning Preferences  Pictoral;Written Material       Education Topics: Hypertension, Hypertension Reduction -Define heart disease and high blood pressure. Discus how high blood pressure affects the body and ways to reduce high blood pressure.   Exercise and Your Heart -Discuss why it is important to exercise, the FITT principles of exercise, normal and abnormal responses to exercise, and how to exercise safely.   Angina -Discuss  definition of angina, causes of angina, treatment of angina, and how to decrease risk of having angina.   Cardiac Medications -Review what the following cardiac medications are used for, how they affect the body, and side effects that may occur when taking the medications.  Medications include Aspirin, Beta blockers, calcium channel blockers, ACE Inhibitors, angiotensin receptor blockers, diuretics, digoxin, and antihyperlipidemics.   Congestive Heart Failure -Discuss the definition of CHF, how to live with CHF, the signs and symptoms of CHF, and how keep track of weight and sodium intake.   Heart Disease and Intimacy -Discus the effect sexual activity has on the heart, how changes occur during intimacy as we age, and safety during sexual activity.   Smoking Cessation / COPD -Discuss different methods to quit smoking, the health benefits of quitting smoking, and the definition of COPD.   Nutrition I: Fats -Discuss the types of cholesterol, what cholesterol does to the heart, and how cholesterol levels can be controlled.   Nutrition II: Labels -Discuss the different components of food labels and how to read food label   Heart Parts/Heart Disease and PAD -Discuss the anatomy of the heart, the pathway of blood circulation through the heart, and these are affected by heart disease.   Stress I: Signs and Symptoms -Discuss the causes of stress, how stress may lead to anxiety and depression, and ways to limit stress.   Stress II: Relaxation -Discuss different types of relaxation techniques to limit stress.   Warning Signs of Stroke / TIA -Discuss definition of a stroke, what the signs and symptoms are of a stroke, and how to identify when someone is having stroke.   Knowledge Questionnaire Score: Knowledge Questionnaire Score - 11/21/18 1509      Knowledge Questionnaire Score   Pre Score  19/24       Core Components/Risk Factors/Patient Goals at Admission: Personal Goals  and Risk Factors at Admission - 11/21/18 1515  Core Components/Risk Factors/Patient Goals on Admission    Weight Management  Weight Gain;Yes    Intervention  Weight Management: Develop a combined nutrition and exercise program designed to reach desired caloric intake, while maintaining appropriate intake of nutrient and fiber, sodium and fats, and appropriate energy expenditure required for the weight goal.;Weight Management: Provide education and appropriate resources to help participant work on and attain dietary goals.    Expected Outcomes  Weight Gain: Understanding of general recommendations for a high calorie, high protein meal plan that promotes weight gain by distributing calorie intake throughout the day with the consumption for 4-5 meals, snacks, and/or supplements;Weight Maintenance: Understanding of the daily nutrition guidelines, which includes 25-35% calories from fat, 7% or less cal from saturated fats, less than 267m cholesterol, less than 1.5gm of sodium, & 5 or more servings of fruits and vegetables daily;Understanding recommendations for meals to include 15-35% energy as protein, 25-35% energy from fat, 35-60% energy from carbohydrates, less than 2071mof dietary cholesterol, 20-35 gm of total fiber daily;Understanding of distribution of calorie intake throughout the day with the consumption of 4-5 meals/snacks    Hypertension  Yes    Intervention  Provide education on lifestyle modifcations including regular physical activity/exercise, weight management, moderate sodium restriction and increased consumption of fresh fruit, vegetables, and low fat dairy, alcohol moderation, and smoking cessation.;Monitor prescription use compliance.    Expected Outcomes  Short Term: Continued assessment and intervention until BP is < 140/9036mG in hypertensive participants. < 130/32m64m in hypertensive participants with diabetes, heart failure or chronic kidney disease.;Long Term: Maintenance of  blood pressure at goal levels.    Lipids  Yes    Intervention  Provide education and support for participant on nutrition & aerobic/resistive exercise along with prescribed medications to achieve LDL <70mg18mL >40mg.30mExpected Outcomes  Short Term: Participant states understanding of desired cholesterol values and is compliant with medications prescribed. Participant is following exercise prescription and nutrition guidelines.;Long Term: Cholesterol controlled with medications as prescribed, with individualized exercise RX and with personalized nutrition plan. Value goals: LDL < 70mg, 54m> 40 mg.    Stress  Yes    Intervention  Offer individual and/or small group education and counseling on adjustment to heart disease, stress management and health-related lifestyle change. Teach and support self-help strategies.;Refer participants experiencing significant psychosocial distress to appropriate mental health specialists for further evaluation and treatment. When possible, include family members and significant others in education/counseling sessions.    Expected Outcomes  Short Term: Participant demonstrates changes in health-related behavior, relaxation and other stress management skills, ability to obtain effective social support, and compliance with psychotropic medications if prescribed.;Long Term: Emotional wellbeing is indicated by absence of clinically significant psychosocial distress or social isolation.       Core Components/Risk Factors/Patient Goals Review:    Core Components/Risk Factors/Patient Goals at Discharge (Final Review):    ITP Comments: ITP Comments    Row Name 11/21/18 1501           ITP Comments  Dr Traci TFransico [place] Director          Comments: Pat attFraser Dined orientation from 1324 to 1448 to review rules and guidelines for program. Completed 6 minute walk test, Intitial ITP, and exercise prescription.  VSS. Telemetry-atrial fib Atrial flutter resting rate in  the 90's. Maximum heart rate noted at 119.  Asymptomatic.Maria WBarnet PallN 11/21/2018 3:56 PM

## 2018-11-23 ENCOUNTER — Other Ambulatory Visit: Payer: Self-pay | Admitting: *Deleted

## 2018-11-23 ENCOUNTER — Telehealth: Payer: Self-pay | Admitting: Interventional Cardiology

## 2018-11-23 DIAGNOSIS — I4819 Other persistent atrial fibrillation: Secondary | ICD-10-CM

## 2018-11-23 NOTE — Telephone Encounter (Signed)
-----   Message from Isaiah Serge, NP sent at 11/23/2018 10:36 AM EST ----- Let Ms Skiver know that the switch back to xarelto will occur with her INR check on 11/28/17 so we will know when to begin the xarelto.

## 2018-11-23 NOTE — Telephone Encounter (Signed)
New message   Patient states that she is returning your call in reference to the previous message.

## 2018-11-23 NOTE — Telephone Encounter (Signed)
I actually spoke with the pt this morning and told her this.  I didn't realize there was a phone note.  I seen the note about switching to Xarelto on her CVRR appt.

## 2018-11-23 NOTE — Telephone Encounter (Signed)
Spoke with pt and she said she had a message from [place] to call her back.  Advised pt Michele Green didn't know that I had already spoke with her this morning before she called.  Advised pt to keep CVRR appt Tuesday and the plan was to switch her to Xarelto at that time.  Pt appreciative for call.

## 2018-11-23 NOTE — Telephone Encounter (Signed)
I have placed call to pt re: switching back to Xarelto.  (See Cecilie Kicks, NP's message below) I have left a message for pt to call back.

## 2018-11-24 ENCOUNTER — Telehealth (HOSPITAL_COMMUNITY): Payer: Self-pay | Admitting: Cardiac Rehabilitation

## 2018-11-24 NOTE — Telephone Encounter (Signed)
-----   Message from [name], MD sent at 11/22/2018 10:57 AM EST ----- Regarding: RE: cardiac rehab We need to refer her to EP to determine if there are any other treatment optoions for AF/AFl. Had MAZE during CABG.  Okay to increase THR to 130 bpm. ----- Message ----- From: Lowell Guitar, RN Sent: 11/21/2018   3:34 PM EST To: [name], MD Subject: cardiac rehab                                  Dear [name],  [place] attended cardiac rehab orientation.  Pt resting HR-100-112 atrial fib/flutter.  BP:  104/62.  Her exercise THR is 119.  Her max HR with walk test today was 119.  Would it be appropriate to increase her target heart rate to 127 (90%)?    Thank you, Andi Hence, RN, BSN Cardiac Pulmonary Rehab

## 2018-11-27 ENCOUNTER — Encounter (HOSPITAL_COMMUNITY)
Admission: RE | Admit: 2018-11-27 | Discharge: 2018-11-27 | Disposition: A | Discharge status: Home / Self Care | Payer: Medicare Other | Source: Ambulatory Visit | Attending: Interventional Cardiology | Admitting: Interventional Cardiology

## 2018-11-27 ENCOUNTER — Encounter (HOSPITAL_COMMUNITY): Payer: Medicare Other

## 2018-11-27 ENCOUNTER — Encounter (HOSPITAL_COMMUNITY): Payer: Self-pay

## 2018-11-27 DIAGNOSIS — Z9889 Other specified postprocedural states: Secondary | ICD-10-CM | POA: Insufficient documentation

## 2018-11-27 DIAGNOSIS — Z951 Presence of aortocoronary bypass graft: Secondary | ICD-10-CM

## 2018-11-27 NOTE — Progress Notes (Addendum)
Daily Session Note  Patient Details  Name: Michele Green MRN: [id number] Date of Birth: 11-01-1941 Referring Provider:   Flowsheet Row CARDIAC REHAB PHASE II ORIENTATION from 11/21/2018 in [place]  Referring Provider  [name]., MD      Encounter Date: 11/27/2018  Check In: Session Check In - 11/27/18 1343    Check-In          Supervising physician immediately available to respond to emergencies  [facility] immediately available    Physician(s)  Dr. Tana Coast    Location  [facility]    Staff Present  Andi Hence, RN, Deland Pretty, MS, ACSM CEP, Exercise Physiologist;Tara Karle Starch, RN, BSN    Medication changes reported      No    Fall or balance concerns reported     No    Tobacco Cessation  No Change    Warm-up and Cool-down  Performed as group-led instruction    Resistance Training Performed  Yes    VAD Patient?  No    PAD/SET Patient?  No        Pain Assessment          Currently in Pain?  No/denies           Capillary Blood Glucose: No results found for this or any previous visit (from the past 24 hour(s)).    Social History   Tobacco Use  Smoking Status Former Smoker  . Packs/day: 1.00  . Years: 20.00  . Pack years: 20.00  . Types: Cigarettes  . Last attempt to quit: 11/22/1992  . Years since quitting: 26.0  Smokeless Tobacco Never Used    Goals Met:  Exercise tolerated well  Goals Unmet:  Not Applicable  Comments: Pt started cardiac rehab today.  Pt tolerated light exercise without difficulty. VSS, telemetry- atrial fibrilation,  asymptomatic.  Medication list reconciled. Pt denies barriers to medicaiton compliance.  PSYCHOSOCIAL ASSESSMENT:  PHQ-0. Pt grieving death of her husband last year, otherwise no psychosocial needs identified at this time, no psychosocial interventions necessary. Pt oriented to exercise equipment and routine.    Understanding verbalized. Andi Hence, RN,  BSN Cardiac Pulmonary Rehab     Dr. Fransico Him is Medical Director for Cardiac Rehab at [facility].

## 2018-11-28 ENCOUNTER — Ambulatory Visit: Payer: Medicare Other | Admitting: Pharmacist

## 2018-11-28 DIAGNOSIS — I4819 Other persistent atrial fibrillation: Secondary | ICD-10-CM | POA: Diagnosis not present

## 2018-11-28 DIAGNOSIS — I484 Atypical atrial flutter: Secondary | ICD-10-CM

## 2018-11-28 DIAGNOSIS — Z7901 Long term (current) use of anticoagulants: Secondary | ICD-10-CM | POA: Diagnosis not present

## 2018-11-28 DIAGNOSIS — Z9889 Other specified postprocedural states: Secondary | ICD-10-CM | POA: Diagnosis not present

## 2018-11-28 DIAGNOSIS — I340 Nonrheumatic mitral (valve) insufficiency: Secondary | ICD-10-CM

## 2018-11-28 LAB — POCT INR: INR: 1.7 — AB (ref 2.0–3.0)

## 2018-11-28 MED ORDER — RIVAROXABAN 20 MG PO TABS: 20.0000 mg | ORAL_TABLET | Freq: Every day | ORAL | 1 refills | Status: DC

## 2018-11-28 NOTE — Patient Instructions (Signed)
Description   Stop taking warfarin and resume taking Xarelto 20 mg every evening with supper.

## 2018-11-29 ENCOUNTER — Encounter (HOSPITAL_COMMUNITY)
Admission: RE | Admit: 2018-11-29 | Discharge: 2018-11-29 | Disposition: A | Discharge status: Home / Self Care | Payer: Medicare Other | Source: Ambulatory Visit | Attending: Interventional Cardiology | Admitting: Interventional Cardiology

## 2018-11-29 ENCOUNTER — Encounter (HOSPITAL_COMMUNITY): Payer: Medicare Other

## 2018-11-29 DIAGNOSIS — Z951 Presence of aortocoronary bypass graft: Secondary | ICD-10-CM

## 2018-11-29 DIAGNOSIS — Z9889 Other specified postprocedural states: Secondary | ICD-10-CM

## 2018-11-29 NOTE — Progress Notes (Signed)
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  acetaminophen (TYLENOL) 325 MG tablet, Take 2 tablets (650 mg total) by mouth every 6 (six) hours as needed for mild pain or fever., Disp: , Rfl:  .  allopurinol (ZYLOPRIM) 100 MG tablet, Take 200 mg by mouth every evening. , Disp: , Rfl:  .  aspirin EC 81 MG EC tablet, Take 1 tablet (81 mg total) by mouth daily., Disp: , Rfl:  .  digoxin (LANOXIN) 0.125 MG tablet, Take 0.0625 mg by mouth daily., Disp: , Rfl:  .  furosemide (LASIX) 20 MG tablet, Take 20 mg by mouth daily as needed (for swelling/fluid retention.). , Disp: , Rfl:  .  LIALDA 1.2 G EC tablet, Take 4.8 g by mouth daily. , Disp: , Rfl:  .  metoprolol tartrate (LOPRESSOR) 100 MG tablet, Take 1 tablet (100 mg total) by mouth 2 (two) times daily., Disp: , Rfl:  .  potassium chloride SA (K-DUR,KLOR-CON) 20 MEQ tablet, Take 1 tablet (20 mEq total) by mouth daily., Disp: 90 tablet, Rfl: 3 .  rivaroxaban (XARELTO) 20 MG TABS tablet, Take 1 tablet (20 mg total) by mouth daily with supper., Disp: 90 tablet, Rfl: 1 .  simvastatin (ZOCOR) 20 MG tablet, Take 20 mg by mouth every evening. , Disp: , Rfl:  .  traMADol (ULTRAM) 50 MG tablet, Take 1 tablet (50 mg total) by mouth every 4 (four) hours as needed for moderate pain., Disp: 28 tablet, Rfl: 0  Past Medical History: Past Medical History:   Diagnosis Date  . Arrhythmia   . Atypical atrial flutter ([place]) 08/14/2018   Post-operative  . Breast cancer of upper-outer quadrant of left female breast ([place]) 02/02/2016  . CAD (coronary artery disease) 06/20/2018   LHC 7/19: pLAD 65/90, oD1 90, mLCx 85, OM2 50, oRCA 30, EF 50-55 >> s/p CABG in 9/19 (L-LAD)  . Colon polyp 02/2012  . Dupuytren contracture    bilateral hands  . Family history of breast cancer   . Hyperlipidemia   . Hypertension   . Mitral regurgitation 11/07/2013   Echo 7/19: Mild LVH, EF 60-65, no RWMA, mod ot severe MR, massive LAE, PASP 33 // TEE 7/19:  Mild conc LVH, EF 60-65, no RWMA, severe MR with mild post leaflet prolapse, massive // s/p MV repair 07/2018  . On continuous oral anticoagulation 08/22/2015   Started on Xarelto 08/12/2015   . Osteopenia   . Persistent atrial fibrillation 08/22/2015   Started late August or early September 2016 // s/p Maze procedure 07/2018  . Radiation Therapy 04/21/16-05/19/16   left breast 47.72 Gy, boosted to 10 Gy  . 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 S/P Maze operation for atrial fibrillation 08/10/2018   Complete bilateral atrial lesion set using bipolar radiofrequency and cryothermy ablation with clipping of LA appendage  . 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 [place]   . Wears glasses     Tobacco Use: Social History   Tobacco Use  Smoking Status Former Smoker  . Packs/day: 1.00  . Years: 20.00  . Pack years: 20.00  . Types: Cigarettes  . Last attempt to quit: 11/22/1992  . Years since quitting: 26.0  Smokeless Tobacco Never Used    Labs: Recent RChemical engineer   Labs for ITP Cardiac and Pulmonary Rehab Latest Ref Rng & Units 08/10/2018 08/10/2018 08/10/2018 08/10/2018 08/11/2018   Hemoglobin A1c 4.8 - 5.6 % - - - - -   PHART 7.350 -  7.450 7.305(L) 7.348(L) 7.314(L) - -   PCO2ART 32.0 - 48.0 mmHg 42.3 32.8 42.9 - -   HCO3 20.0 - 28.0 mmol/L 21.5 18.0(L) 21.8 - -   TCO2 22 - 32 mmol/L 23 19(L) 23 23 24    ACIDBASEDEF 0.0 - 2.0 mmol/L 5.0(H) 7.0(H) 4.0(H) - -   O2SAT % 98.0 94.0 98.0 - -      Capillary Blood Glucose: Lab Results  Component Value Date   GLUCAP 88 08/14/2018   GLUCAP 94 08/13/2018   GLUCAP 87 08/13/2018   GLUCAP 101 (H) 08/13/2018   GLUCAP 103 (H) 08/13/2018     Exercise Target Goals: Exercise Program Goal: Individual exercise prescription set using results from initial 6 min walk test and THRR while considering  patient's activity barriers and safety.   Exercise Prescription Goal: Initial exercise prescription builds to 30-45 minutes a day of aerobic activity, 2-3 days per week.  Home exercise guidelines will be given to patient during program as part of exercise prescription that the participant will acknowledge.  Activity Barriers & Risk Stratification: Activity Barriers & Cardiac Risk Stratification - 11/21/18 1351    Activity Barriers & Cardiac Risk Stratification          Activity Barriers  Back Problems    Cardiac Risk Stratification  High           6 Minute Walk: 6 Minute Walk    6 Minute Walk    Row Name 11/21/18 1400   Phase  Initial   Distance  1303 feet   Walk Time  6 minutes   # of Rest Breaks  0   MPH  2.47   METS  2.69   RPE  9   Perceived Dyspnea   0   VO2 Peak  9.43   Symptoms  No   Resting HR  95 bpm   Resting BP  104/62   Resting Oxygen Saturation   95 %   Exercise Oxygen Saturation  during 6 min walk  93 %   Max Ex. HR  119 bpm   Max Ex. BP  118/80   2 Minute Post BP  104/62          Oxygen Initial Assessment:   Oxygen Re-Evaluation:   Oxygen Discharge (Final Oxygen Re-Evaluation):   Initial Exercise Prescription: Initial Exercise Prescription - 11/21/18 1500    Date of Initial Exercise RX and Referring Provider          Date  11/21/18     Referring Provider  SBelva Crome, MD    Expected Discharge Date  02/26/19        Recumbant Bike          Level  2  Watts  15    Minutes  10    METs  2.8        NuStep          Level  2    SPM  85    Minutes  10    METs  2.5        Track          Laps  9    Minutes  10    METs  2.57        Prescription Details          Frequency (times per week)  2    Duration  Progress to 30 minutes of continuous aerobic without signs/symptoms of physical distress        Intensity          THRR 40-80% of Max Heartrate  56-113    Ratings of Perceived Exertion  11-13    Perceived Dyspnea  0-4        Progression          Progression  Continue to progress workloads to maintain intensity without signs/symptoms of physical distress.        Resistance Training          Training Prescription  Yes    Weight  2lbs    Reps  10-15           Perform Capillary Blood Glucose checks as needed.  Exercise Prescription Changes: Exercise Prescription Changes    Response to Exercise    Row Name 11/27/18 1329   Blood Pressure (Admit)  120/80   Blood Pressure (Exercise)  132/80   Blood Pressure (Exit)  120/80   Heart Rate (Admit)  97 bpm   Heart Rate (Exercise)  104 bpm   Heart Rate (Exit)  100 bpm   Rating of Perceived Exertion (Exercise)  13   Symptoms  none   Duration  Progress to 30 minutes of  aerobic without signs/symptoms of physical distress   Intensity  THRR unchanged       Progression    Row Name 11/27/18 1329   Progression  Continue to progress workloads to maintain intensity without signs/symptoms of physical distress.   Average METs  1.9       Resistance Training    Row Name 11/27/18 1329   Training Prescription  Yes   Weight  2lbs   Reps  10-15   Time  10 Minutes       Interval Training    Row Name 11/27/18 1329   Interval Training  No       Recumbant Bike    Row Name 11/27/18 1329   Level  2   Watts  15   Minutes  10   METs  2        NuStep    Row Name 11/27/18 1329   Level  2   SPM  85   Minutes  20   METs  1.8          Exercise Comments: Exercise Comments    Row Name 11/27/18 1424   Exercise Comments  Patient tolerated 1st session of exercise well without c/o.      Exercise Goals and Review: Exercise Goals    Exercise Goals    Row Name 11/21/18 1352   Increase Physical Activity  Yes   Intervention  Provide advice, education, support and counseling about physical activity/exercise needs.;Develop an individualized exercise prescription for aerobic and resistive training  based on initial evaluation findings, risk stratification, comorbidities and participant's personal goals.   Expected Outcomes  Short Term: Attend rehab on a regular basis to increase amount of physical activity.;Long Term: Add in home exercise to make exercise part of routine and to increase amount of physical activity.;Long Term: Exercising regularly at least 3-5 days a week.   Increase Strength and Stamina  Yes   Intervention  Provide advice, education, support and counseling about physical activity/exercise needs.;Develop an individualized exercise prescription for aerobic and resistive training based on initial evaluation findings, risk stratification, comorbidities and participant's personal goals.   Expected Outcomes  Short Term: Increase workloads from initial exercise prescription for resistance, speed, and METs.;Short Term: Perform resistance training exercises routinely during rehab and add in resistance training at home;Long Term: Improve cardiorespiratory fitness, muscular endurance and strength as measured by increased METs and functional capacity (6MWT)   Able to understand and use rate of perceived exertion (RPE) scale  Yes   Intervention  Provide education and explanation on how to use RPE scale   Expected Outcomes  Short Term: Able to use RPE daily in rehab to express subjective intensity level;Long Term:  Able to use RPE to guide  intensity level when exercising independently   Knowledge and understanding of Target Heart Rate Range (THRR)  Yes   Intervention  Provide education and explanation of THRR including how the numbers were predicted and where they are located for reference   Expected Outcomes  Short Term: Able to state/look up THRR;Long Term: Able to use THRR to govern intensity when exercising independently;Short Term: Able to use daily as guideline for intensity in rehab   Understanding of Exercise Prescription  Yes   Intervention  Provide education, explanation, and written materials on patient's individual exercise prescription   Expected Outcomes  Short Term: Able to explain program exercise prescription;Long Term: Able to explain home exercise prescription to exercise independently          Exercise Goals Re-Evaluation : Exercise Goals Re-Evaluation    Exercise Goal Re-Evaluation    Row Name 11/27/18 1424   Exercise Goals Review  Increase Physical Activity;Able to understand and use rate of perceived exertion (RPE) scale   Comments  Patient able to understand and use RPE scale appropriately.   Expected Outcomes  Increase workloads as tolerated to help improve cardiorespiratory fitness.          Discharge Exercise Prescription (Final Exercise Prescription Changes): Exercise Prescription Changes - 11/27/18 1329    Response to Exercise          Blood Pressure (Admit)  120/80    Blood Pressure (Exercise)  132/80    Blood Pressure (Exit)  120/80    Heart Rate (Admit)  97 bpm    Heart Rate (Exercise)  104 bpm    Heart Rate (Exit)  100 bpm    Rating of Perceived Exertion (Exercise)  13    Symptoms  none    Duration  Progress to 30 minutes of  aerobic without signs/symptoms of physical distress    Intensity  THRR unchanged        Progression          Progression  Continue to progress workloads to maintain intensity without signs/symptoms of physical distress.    Average METs  1.9         Resistance Training          Training Prescription  Yes    Weight  2lbs    Reps  10-15    Time  10 Minutes        Interval Training          Interval Training  No        Recumbant Bike          Level  2    Watts  15    Minutes  10    METs  2        NuStep          Level  2    SPM  85    Minutes  20    METs  1.8           Nutrition:  Target Goals: Understanding of nutrition guidelines, daily intake of sodium <1566m, cholesterol <2054m calories 30% from fat and 7% or less from saturated fats, daily to have 5 or more servings of fruits and vegetables.  Biometrics: Pre Biometrics - 11/21/18 1331    Pre Biometrics          Height  5' 3"  (1.6 m)    Weight  56.1 kg    Waist Circumference  31.75 inches    Hip Circumference  36.5 inches    Waist to Hip Ratio  0.87 %    BMI (Calculated)  21.91    Triceps Skinfold  11 mm    % Body Fat  31 %    Grip Strength  24.5 kg    Flexibility  --   not performed: back issues    Single Leg Stand  4.28 seconds            Nutrition Therapy Plan and Nutrition Goals: Nutrition Therapy & Goals - 11/24/18 1158    Nutrition Therapy          Diet  heart healthy        Personal Nutrition Goals          Nutrition Goal  Pt to eat minimum of 3 meals daily    Personal Goal #2  pt to continue to follow heart healthy diet        Intervention Plan          Intervention  Prescribe, educate and counsel regarding individualized specific dietary modifications aiming towards targeted core components such as weight, hypertension, lipid management, diabetes, heart failure and other comorbidities.    Expected Outcomes  Long Term Goal: Adherence to prescribed nutrition plan.           Nutrition Assessments: Nutrition Assessments - 11/21/18 1347    MEDFICTS Scores          Pre Score  6           Nutrition Goals Re-Evaluation:   Nutrition Goals Re-Evaluation:   Nutrition Goals Discharge (Final Nutrition Goals  Re-Evaluation):   Psychosocial: Target Goals: Acknowledge presence or absence of significant depression and/or stress, maximize coping skills, provide positive support system. Participant is able to verbalize types and ability to use techniques and skills needed for reducing stress and depression.  Initial Review & Psychosocial Screening: Initial Psych Review & Screening - 11/21/18 1502    Initial Review          Current issues with  Current Stress Concerns    Source of Stress Concerns  Family    Comments  Pat's husband passed away last year from brain caTryon Yes  [name] has her sister for support in town and a son who lives 2 hours away       Barriers          Psychosocial barriers to participate in program  The patient should benefit from training in stress management and relaxation.        Screening Interventions          Interventions  Encouraged to exercise    Expected Outcomes  Long Term Goal: Stressors or current issues are controlled or eliminated.           Quality of Life Scores: Quality of Life - 11/21/18 1513    Quality of Life          Select  Quality of Life        Quality of Life Scores          Health/Function Pre  28.5 %    Socioeconomic Pre  30 %    Psych/Spiritual Pre  30 %    Family Pre  30 %    GLOBAL Pre  29.4 %          Scores of 19 and below usually indicate a poorer quality of life in these areas.  A difference of  2-3 points is a clinically meaningful difference.  A difference of 2-3 points in the total score of the Quality of Life Index has been associated with significant improvement in overall quality of life, self-image, physical symptoms, and general health in studies assessing change in quality of life.  PHQ-9: Recent Review Flowsheet Data    Depression screen [facility] 2/9 11/27/2018 12/30/2016 06/24/2016   Decreased Interest 0 0 0   Down, Depressed, Hopeless 0 - 0   PHQ - 2 Score 0 0  0     Interpretation of Total Score  Total Score Depression Severity:  1-4 = Minimal depression, 5-9 = Mild depression, 10-14 = Moderate depression, 15-19 = Moderately severe depression, 20-27 = Severe depression   Psychosocial Evaluation and Intervention: Psychosocial Evaluation - 11/27/18 1424    Psychosocial Evaluation & Interventions          Interventions  Encouraged to exercise with the program and follow exercise prescription;Stress management education;Relaxation education    Comments  pt currently exhibits normal grief pattern.  otherwise no psychosocial needs identified, no interventions necessary. pt enjoys spending time with people and animals.     Expected Outcomes  pt will exhibit positive outlook with good coping skills.      Continue Psychosocial Services   No Follow up required           Psychosocial Re-Evaluation:   Psychosocial Discharge (Final Psychosocial Re-Evaluation):   Vocational Rehabilitation: Provide vocational rehab assistance to qualifying candidates.   Vocational Rehab Evaluation & Intervention: Vocational Rehab - 11/21/18 1515    Initial Vocational Rehab Evaluation & Intervention          Assessment shows need for Vocational Rehabilitation  No           Education: Education Goals: Education classes will be provided on a weekly basis, covering required topics. Participant will state understanding/return demonstration of topics presented.  Learning Barriers/Preferences: Learning Barriers/Preferences - 11/21/18 1509    Learning Barriers/Preferences          Learning Barriers  Sight   glasses   Learning Preferences  Pictoral;Written Material           Education Topics: Count Your Pulse:  -Group instruction provided by verbal instruction, demonstration,  patient participation and written materials to support subject.  Instructors address importance of being able to find your pulse and how to count your pulse when at home without a  heart monitor.  Patients get hands on experience counting their pulse with staff help and individually.   Heart Attack, Angina, and Risk Factor Modification:  -Group instruction provided by verbal instruction, video, and written materials to support subject.  Instructors address signs and symptoms of angina and heart attacks.    Also discuss risk factors for heart disease and how to make changes to improve heart health risk factors.   Functional Fitness:  -Group instruction provided by verbal instruction, demonstration, patient participation, and written materials to support subject.  Instructors address safety measures for doing things around the house.  Discuss how to get up and down off the floor, how to pick things up properly, how to safely get out of a chair without assistance, and balance training.   Meditation and Mindfulness:  -Group instruction provided by verbal instruction, patient participation, and written materials to support subject.  Instructor addresses importance of mindfulness and meditation practice to help reduce stress and improve awareness.  Instructor also leads participants through a meditation exercise.    Stretching for Flexibility and Mobility:  -Group instruction provided by verbal instruction, patient participation, and written materials to support subject.  Instructors lead participants through series of stretches that are designed to increase flexibility thus improving mobility.  These stretches are additional exercise for major muscle groups that are typically performed during regular warm up and cool down.   Hands Only CPR:  -Group verbal, video, and participation provides a basic overview of AHA guidelines for community CPR. Role-play of emergencies allow participants the opportunity to practice calling for help and chest compression technique with discussion of AED use.   Hypertension: -Group verbal and written instruction that provides a basic overview of  hypertension including the most recent diagnostic guidelines, risk factor reduction with self-care instructions and medication management.    Nutrition I class: Heart Healthy Eating:  -Group instruction provided by PowerPoint slides, verbal discussion, and written materials to support subject matter. The instructor gives an explanation and review of the Therapeutic Lifestyle Changes diet recommendations, which includes a discussion on lipid goals, dietary fat, sodium, fiber, plant stanol/sterol esters, sugar, and the components of a well-balanced, healthy diet.   Nutrition II class: Lifestyle Skills:  -Group instruction provided by PowerPoint slides, verbal discussion, and written materials to support subject matter. The instructor gives an explanation and review of label reading, grocery shopping for heart health, heart healthy recipe modifications, and ways to make healthier choices when eating out.   Diabetes Question & Answer:  -Group instruction provided by PowerPoint slides, verbal discussion, and written materials to support subject matter. The instructor gives an explanation and review of diabetes co-morbidities, pre- and post-prandial blood glucose goals, pre-exercise blood glucose goals, signs, symptoms, and treatment of hypoglycemia and hyperglycemia, and foot care basics.   Diabetes Blitz:  -Group instruction provided by PowerPoint slides, verbal discussion, and written materials to support subject matter. The instructor gives an explanation and review of the physiology behind type 1 and type 2 diabetes, diabetes medications and rational behind using different medications, pre- and post-prandial blood glucose recommendations and Hemoglobin A1c goals, diabetes diet, and exercise including blood glucose guidelines for exercising safely.    Portion Distortion:  -Group instruction provided by PowerPoint slides, verbal discussion, written materials, and food models to support subject  matter. The  instructor gives an explanation of serving size versus portion size, changes in portions sizes over the last 20 years, and what consists of a serving from each food group.   Stress Management:  -Group instruction provided by verbal instruction, video, and written materials to support subject matter.  Instructors review role of stress in heart disease and how to cope with stress positively.     Exercising on Your Own:  -Group instruction provided by verbal instruction, power point, and written materials to support subject.  Instructors discuss benefits of exercise, components of exercise, frequency and intensity of exercise, and end points for exercise.  Also discuss use of nitroglycerin and activating EMS.  Review options of places to exercise outside of rehab.  Review guidelines for sex with heart disease.   Cardiac Drugs I:  -Group instruction provided by verbal instruction and written materials to support subject.  Instructor reviews cardiac drug classes: antiplatelets, anticoagulants, beta blockers, and statins.  Instructor discusses reasons, side effects, and lifestyle considerations for each drug class.   Cardiac Drugs II:  -Group instruction provided by verbal instruction and written materials to support subject.  Instructor reviews cardiac drug classes: angiotensin converting enzyme inhibitors (ACE-I), angiotensin II receptor blockers (ARBs), nitrates, and calcium channel blockers.  Instructor discusses reasons, side effects, and lifestyle considerations for each drug class.   Anatomy and Physiology of the Circulatory System:  Group verbal and written instruction and models provide basic cardiac anatomy and physiology, with the coronary electrical and arterial systems. Review of: AMI, Angina, Valve disease, Heart Failure, Peripheral Artery Disease, Cardiac Arrhythmia, Pacemakers, and the ICD.   Other Education:  -Group or individual verbal, written, or video instructions  that support the educational goals of the cardiac rehab program.   Holiday Eating Survival Tips:  -Group instruction provided by PowerPoint slides, verbal discussion, and written materials to support subject matter. The instructor gives patients tips, tricks, and techniques to help them not only survive but enjoy the holidays despite the onslaught of food that accompanies the holidays.   Knowledge Questionnaire Score: Knowledge Questionnaire Score - 11/21/18 1509    Knowledge Questionnaire Score          Pre Score  19/24           Core Components/Risk Factors/Patient Goals at Admission: Personal Goals and Risk Factors at Admission - 11/21/18 1515    Core Components/Risk Factors/Patient Goals on Admission           Weight Management  Weight Gain;Yes    Intervention  Weight Management: Develop a combined nutrition and exercise program designed to reach desired caloric intake, while maintaining appropriate intake of nutrient and fiber, sodium and fats, and appropriate energy expenditure required for the weight goal.;Weight Management: Provide education and appropriate resources to help participant work on and attain dietary goals.    Expected Outcomes  Weight Gain: Understanding of general recommendations for a high calorie, high protein meal plan that promotes weight gain by distributing calorie intake throughout the day with the consumption for 4-5 meals, snacks, and/or supplements;Weight Maintenance: Understanding of the daily nutrition guidelines, which includes 25-35% calories from fat, 7% or less cal from saturated fats, less than 254m cholesterol, less than 1.5gm of sodium, & 5 or more servings of fruits and vegetables daily;Understanding recommendations for meals to include 15-35% energy as protein, 25-35% energy from fat, 35-60% energy from carbohydrates, less than 2065mof dietary cholesterol, 20-35 gm of total fiber daily;Understanding of distribution of calorie intake throughout the  day  with the consumption of 4-5 meals/snacks    Hypertension  Yes    Intervention  Provide education on lifestyle modifcations including regular physical activity/exercise, weight management, moderate sodium restriction and increased consumption of fresh fruit, vegetables, and low fat dairy, alcohol moderation, and smoking cessation.;Monitor prescription use compliance.    Expected Outcomes  Short Term: Continued assessment and intervention until BP is < 140/33m HG in hypertensive participants. < 130/822mHG in hypertensive participants with diabetes, heart failure or chronic kidney disease.;Long Term: Maintenance of blood pressure at goal levels.    Lipids  Yes    Intervention  Provide education and support for participant on nutrition & aerobic/resistive exercise along with prescribed medications to achieve LDL <7024mHDL >37m72m  Expected Outcomes  Short Term: Participant states understanding of desired cholesterol values and is compliant with medications prescribed. Participant is following exercise prescription and nutrition guidelines.;Long Term: Cholesterol controlled with medications as prescribed, with individualized exercise RX and with personalized nutrition plan. Value goals: LDL < 70mg55mL > 40 mg.    Stress  Yes    Intervention  Offer individual and/or small group education and counseling on adjustment to heart disease, stress management and health-related lifestyle change. Teach and support self-help strategies.;Refer participants experiencing significant psychosocial distress to appropriate mental health specialists for further evaluation and treatment. When possible, include family members and significant others in education/counseling sessions.    Expected Outcomes  Short Term: Participant demonstrates changes in health-related behavior, relaxation and other stress management skills, ability to obtain effective social support, and compliance with psychotropic medications if  prescribed.;Long Term: Emotional wellbeing is indicated by absence of clinically significant psychosocial distress or social isolation.           Core Components/Risk Factors/Patient Goals Review:  Goals and Risk Factor Review    Core Components/Risk Factors/Patient Goals Review    Row Name 11/27/18 1425   Personal Goals Review  Weight Management/Obesity;Hypertension;Lipids;Stress   Review  pt with multiple CAD RF demonstrates willingness to participate in CR program. pt eager to increase strength/stamina and decrease abdominal fat.     Expected Outcomes  pt will participate in CR exercise, nutrition and lifstyle modification to decrease overall RF.           Core Components/Risk Factors/Patient Goals at Discharge (Final Review):  Goals and Risk Factor Review - 11/27/18 1425    Core Components/Risk Factors/Patient Goals Review          Personal Goals Review  Weight Management/Obesity;Hypertension;Lipids;Stress    Review  pt with multiple CAD RF demonstrates willingness to participate in CR program. pt eager to increase strength/stamina and decrease abdominal fat.      Expected Outcomes  pt will participate in CR exercise, nutrition and lifstyle modification to decrease overall RF.            ITP Comments: ITP Comments    Row Name 11/21/18 1501 11/27/18 1423   ITP Comments  Dr TraciFransico Himical Director  pt started group exercise program. pt tolerated light activity without difficulty. pt oriented to exercise equipment and safety routine. understanding verbalized.       Comments:

## 2018-12-01 ENCOUNTER — Encounter (HOSPITAL_COMMUNITY): Payer: Medicare Other

## 2018-12-04 ENCOUNTER — Encounter (HOSPITAL_COMMUNITY): Payer: Medicare Other

## 2018-12-04 ENCOUNTER — Encounter (HOSPITAL_COMMUNITY)
Admission: RE | Admit: 2018-12-04 | Discharge: 2018-12-04 | Disposition: A | Discharge status: Home / Self Care | Payer: Medicare Other | Source: Ambulatory Visit | Attending: Interventional Cardiology | Admitting: Interventional Cardiology

## 2018-12-04 DIAGNOSIS — Z9889 Other specified postprocedural states: Secondary | ICD-10-CM

## 2018-12-04 DIAGNOSIS — Z951 Presence of aortocoronary bypass graft: Secondary | ICD-10-CM

## 2018-12-06 ENCOUNTER — Encounter (HOSPITAL_COMMUNITY)
Admission: RE | Admit: 2018-12-06 | Discharge: 2018-12-06 | Disposition: A | Discharge status: Home / Self Care | Payer: Medicare Other | Source: Ambulatory Visit | Attending: Interventional Cardiology | Admitting: Interventional Cardiology

## 2018-12-06 ENCOUNTER — Encounter (HOSPITAL_COMMUNITY): Payer: Medicare Other

## 2018-12-06 DIAGNOSIS — Z951 Presence of aortocoronary bypass graft: Secondary | ICD-10-CM

## 2018-12-06 DIAGNOSIS — Z9889 Other specified postprocedural states: Secondary | ICD-10-CM

## 2018-12-07 ENCOUNTER — Encounter: Payer: Self-pay | Admitting: *Deleted

## 2018-12-08 ENCOUNTER — Encounter (HOSPITAL_COMMUNITY): Payer: Medicare Other

## 2018-12-11 ENCOUNTER — Encounter (HOSPITAL_COMMUNITY)
Admission: RE | Admit: 2018-12-11 | Discharge: 2018-12-11 | Disposition: A | Discharge status: Home / Self Care | Payer: Medicare Other | Source: Ambulatory Visit | Attending: Interventional Cardiology | Admitting: Interventional Cardiology

## 2018-12-11 ENCOUNTER — Encounter (HOSPITAL_COMMUNITY): Payer: Medicare Other

## 2018-12-11 DIAGNOSIS — Z951 Presence of aortocoronary bypass graft: Secondary | ICD-10-CM | POA: Diagnosis not present

## 2018-12-11 DIAGNOSIS — Z9889 Other specified postprocedural states: Secondary | ICD-10-CM

## 2018-12-12 ENCOUNTER — Other Ambulatory Visit: Payer: Self-pay | Admitting: Interventional Cardiology

## 2018-12-13 ENCOUNTER — Encounter (HOSPITAL_COMMUNITY)
Admission: RE | Admit: 2018-12-13 | Discharge: 2018-12-13 | Disposition: A | Discharge status: Home / Self Care | Payer: Medicare Other | Source: Ambulatory Visit | Attending: Interventional Cardiology | Admitting: Interventional Cardiology

## 2018-12-13 ENCOUNTER — Encounter (HOSPITAL_COMMUNITY): Payer: Medicare Other

## 2018-12-13 DIAGNOSIS — Z951 Presence of aortocoronary bypass graft: Secondary | ICD-10-CM | POA: Diagnosis not present

## 2018-12-13 DIAGNOSIS — Z9889 Other specified postprocedural states: Secondary | ICD-10-CM

## 2018-12-13 NOTE — Progress Notes (Signed)
I have reviewed a Home Exercise Prescription with [name] . Carolyna is currently exercising at home.  The patient was advised to walk 2-4 days a week for 30-45 minutes.  Mardene Celeste and I discussed how to progress their exercise prescription.  The patient stated that their goals were to start walking more than she has been.  The patient stated that they understand the exercise prescription.  We reviewed exercise guidelines, target heart rate during exercise, RPE Scale, weather conditions, NTG use, endpoints for exercise, warmup and cool down.  Patient is encouraged to come to me with any questions. I will continue to follow up with the patient to assist them with progression and safety.    12/13/2018  2:45 PM   Deitra Mayo BS, ACSM CEP

## 2018-12-15 ENCOUNTER — Encounter (HOSPITAL_COMMUNITY): Payer: Medicare Other

## 2018-12-18 ENCOUNTER — Encounter (HOSPITAL_COMMUNITY)
Admission: RE | Admit: 2018-12-18 | Discharge: 2018-12-18 | Disposition: A | Discharge status: Home / Self Care | Payer: Medicare Other | Source: Ambulatory Visit | Attending: Interventional Cardiology | Admitting: Interventional Cardiology

## 2018-12-18 ENCOUNTER — Encounter: Payer: Self-pay | Admitting: Cardiology

## 2018-12-18 ENCOUNTER — Encounter (HOSPITAL_COMMUNITY): Payer: Medicare Other

## 2018-12-18 ENCOUNTER — Ambulatory Visit: Payer: Medicare Other | Admitting: Cardiology

## 2018-12-18 VITALS — BP 110/66 | HR 99 | Ht 64.0 in | Wt 124.5 lb

## 2018-12-18 DIAGNOSIS — Z951 Presence of aortocoronary bypass graft: Secondary | ICD-10-CM | POA: Diagnosis not present

## 2018-12-18 DIAGNOSIS — I4819 Other persistent atrial fibrillation: Secondary | ICD-10-CM

## 2018-12-18 DIAGNOSIS — I484 Atypical atrial flutter: Secondary | ICD-10-CM

## 2018-12-18 DIAGNOSIS — Z9889 Other specified postprocedural states: Secondary | ICD-10-CM

## 2018-12-18 NOTE — Progress Notes (Signed)
Michele Green 78 y.o. female Nutrition Note Spoke with pt. Nutrition Plan and Nutrition Survey goals reviewed with pt. Pt is following a Heart Healthy diet. Pt wants to continue to follow heart healthy diet. Heart healthy eating tips reviewed (label reading, how to build a healthy plate, portion sizes, eating frequently across the day). Per discussion, pt does not use canned/convenience foods often. Pt does not add salt to food. Pt does not eat out frequently. Pt shred she does not eat fried foods, fatty cuts of meat, only has desserts occasionally and tries not to over indulge. Praised pt for her balanced approach and encouraged her to keep up the great work. Pt expressed understanding of the information reviewed. Pt aware of nutrition education classes offered and does plan on attending nutrition classes.  Lab Results  Component Value Date   HGBA1C 4.6 (L) 08/07/2018    Wt Readings from Last 3 Encounters:  12/18/18 124 lb 8 oz (56.5 kg)  11/21/18 123 lb 10.9 oz (56.1 kg)  11/20/18 120 lb (54.4 kg)    Nutrition Diagnosis  Food-and nutrition-related knowledge deficit related to lack of exposure to information as related to diagnosis of: ? CVD   Nutrition Intervention ? Pt's individual nutrition plan reviewed with pt. ? Benefits of continuing to adopt Heart Healthy diet discussed when Medficts reviewed.    Goal(s) ? Pt to describe the benefit of including fruits, vegetables, whole grains, and low-fat dairy products in a heart healthy meal plan.  Plan:   Pt to attend nutrition classes ? Nutrition I ? Nutrition II ? Portion Distortion   Will provide client-centered nutrition education as part of interdisciplinary care  Monitor and evaluate progress toward nutrition goal with team.    Laurina Bustle, MS, RD, LDN 12/18/2018 2:02 PM

## 2018-12-18 NOTE — Patient Instructions (Addendum)
Medication Instructions:  Your physician has recommended you make the following change in your medication:  1. STOP Digoxin  * If you need a refill on your cardiac medications before your next appointment, please call your pharmacy.   Labwork: None ordered  Testing/Procedures: None ordered  Follow-Up: Your physician recommends that you schedule a follow-up appointment in: 3 months with Glen, PA in the AFib clinic.  Thank you for choosing [facility]!!   Trinidad Curet, RN [phone]

## 2018-12-18 NOTE — Progress Notes (Signed)
Okay 

## 2018-12-18 NOTE — Progress Notes (Signed)
Electrophysiology Office Note   Date:  12/18/2018   ID:  Michele Green, DOB 1941-02-03, MRN [id number]  PCP:  [name], MD  Cardiologist:  Dr Tamala Julian Primary Electrophysiologist: Dr Curt Bears    Chief Complaint  Patient presents with  . Other    Referred by [name] for Afib. Patient states she thinks she goes in and out of afib. Meds reviewed verbally with patient.     History of Present Illness: Michele Green is a 78 y.o. female who is being seen today for the evaluation of atrial fibrillation at the request of [name], MD. She has a PMH significant for CAD s/p CABG, MR s/p repair, breast cancer, and HTN. She reports that she has had atrial fibrillation for many years and has been fairly asymptomatic. She had a Maze procedure at the time of her CABG and MV repair surgery on 08/10/18. Since then, she has also had a DCCV on 10/05/18 but again went back into rate controlled atrial fibrillation. She is in atrial flutter today. She reports that she continues to feel stronger after surgery. Patient is able to exercise at cardiac rehab without issue.  Today, she denies symptoms of palpitations, chest pain, shortness of breath, orthopnea, PND, lower extremity edema, claudication, dizziness, presyncope, syncope, bleeding, or neurologic sequela. The patient is tolerating medications without difficulties.    Past Medical History:  Diagnosis Date  . Arrhythmia   . Atypical atrial flutter ([place]) 08/14/2018   Post-operative  . Breast cancer of upper-outer quadrant of left female breast ([place]) 02/02/2016  . CAD (coronary artery disease) 06/20/2018   LHC 7/19: pLAD 65/90, oD1 90, mLCx 85, OM2 50, oRCA 30, EF 50-55 >> s/p CABG in 9/19 (L-LAD)  . Colon polyp 02/2012  . Dupuytren contracture    bilateral hands  . Family history of breast cancer   . Hyperlipidemia   . Hypertension   . Mitral regurgitation 11/07/2013   Echo 7/19: Mild LVH, EF 60-65, no RWMA, mod ot severe MR, massive  LAE, PASP 33 // TEE 7/19:  Mild conc LVH, EF 60-65, no RWMA, severe MR with mild post leaflet prolapse, massive // s/p MV repair 07/2018  . On continuous oral anticoagulation 08/22/2015   Started on Xarelto 08/12/2015   . Osteopenia   . Persistent atrial fibrillation 08/22/2015   Started late August or early September 2016 // s/p Maze procedure 07/2018  . Radiation Therapy 04/21/16-05/19/16   left breast 47.72 Gy, boosted to 10 Gy  . 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 S/P Maze operation for atrial fibrillation 08/10/2018   Complete bilateral atrial lesion set using bipolar radiofrequency and cryothermy ablation with clipping of LA appendage  . S/P MVR (mitral valve repair) 08/10/2018   Complex valvuloplasty including Gore-tex neochord placement x8, Plication of Lateral Commissure and 69m Sorin Memo 4D Ring Annuloplasty SN# [id number] . [place]   . Wears glasses    Past Surgical History:  Procedure Laterality Date  . BREAST BIOPSY Left 01/28/2016  . BREAST LUMPECTOMY Left 02/23/2016  . BREAST LUMPECTOMY WITH RADIOACTIVE SEED LOCALIZATION Left 02/23/2016   Procedure: BREAST LUMPECTOMY WITH RADIOACTIVE SEED LOCALIZATION;  Surgeon: BExcell Seltzer MD;  [facility]  Service: General;  Laterality: Left;  . CARDIAC CATHETERIZATION    . CARDIOVERSION N/A 10/02/2015   Procedure: CARDIOVERSION;  Surgeon: MJerline Pain MD;  [place] [facility]  Service: Cardiovascular;  Laterality: N/A;  . CARDIOVERSION  N/A 10/05/2018   Procedure: CARDIOVERSION;  Surgeon: Fay Records, MD;  Location: [facility];  Service: Cardiovascular;  Laterality: N/A;  . CLIPPING OF ATRIAL APPENDAGE  08/10/2018   Procedure: CLIPPING OF ATRIAL APPENDAGE;  Surgeon: Rexene Alberts, MD;  Location: [facility];  Service: Open Heart Surgery;;  . COLONOSCOPY    . CORONARY ARTERY BYPASS GRAFT N/A 08/10/2018   Procedure: CORONARY ARTERY BYPASS GRAFTING (CABG) x 1, LIMA-LAD,  USING LEFT  INTERNAL MAMMARY ARTERY. HARVESTED RIGHT GREATER SAPHENOUS VEIN ENDOSCOPICALLY;  Surgeon: Rexene Alberts, MD;  Location: [facility];  Service: Open Heart Surgery;  Laterality: N/A;  . DUPUYTREN CONTRACTURE RELEASE  2001   leftx2  . [place]   right  . DUPUYTREN CONTRACTURE RELEASE Right 05/02/2014   Procedure: EXCISION DUPUYTRENS RIGHT PALMAR/SMALL ;  Surgeon: Cammie Sickle, MD;  Location: [facility];  Service: Orthopedics;  Laterality: Right;  [name] MAZE N/A 08/10/2018   Procedure: MAZE;  Surgeon: Rexene Alberts, MD;  Location: [facility];  Service: Open Heart Surgery;  Laterality: N/A;  . MITRAL VALVE REPAIR N/A 08/10/2018   Procedure: MITRAL VALVE REPAIR (MVR);  Surgeon: Rexene Alberts, MD;  Location: [facility];  Service: Open Heart Surgery;  Laterality: N/A;  glutaraldehyde  . RIGHT/LEFT HEART CATH AND CORONARY ANGIOGRAPHY N/A 06/20/2018   Procedure: RIGHT/LEFT HEART CATH AND CORONARY ANGIOGRAPHY;  Surgeon: [name], MD;  Location: [facility];  Service: Cardiovascular;  Laterality: N/A;  . TEE WITHOUT CARDIOVERSION N/A 06/20/2018   Procedure: TRANSESOPHAGEAL ECHOCARDIOGRAM (TEE);  Surgeon: Pixie Casino, MD;  Location[facility] END[facility]COPY;  Service: Cardiovascular;  Laterality: N/A;  . TEE WITHOUT CARDIOVERSION N/A 08/10/2018   Procedure: TRANSESOPHAGEAL ECHOCARDIOGRAM (TEE);  Surgeon: Rexene Alberts, MD;  Location: [facility];  Service: Open Heart Surgery;  Laterality: N/A;  . TONSILLECTOMY  age 51     Current Outpatient Medications  Medication Sig Dispense Refill  . acetaminophen (TYLENOL) 325 MG tablet Take 2 tablets (650 mg total) by mouth every 6 (six) hours as needed for mild pain or fever.    [name] allopurinol (ZYLOPRIM) 100 MG tablet Take 200 mg by mouth every evening.     [name] aspirin EC 81 MG EC tablet Take 1 tablet (81 mg total) by mouth daily.    . furosemide (LASIX) 20 MG tablet Take 20 mg by mouth daily as needed (for swelling/fluid retention.).      [name] LIALDA 1.2 G EC tablet Take 4.8 g by mouth daily.     . metoprolol tartrate (LOPRESSOR) 100 MG tablet Take 1 tablet (100 mg total) by mouth 2 (two) times daily.    . potassium chloride SA (K-DUR,KLOR-CON) 20 MEQ tablet Take 1 tablet (20 mEq total) by mouth daily. 90 tablet 3  . rivaroxaban (XARELTO) 20 MG TABS tablet Take 1 tablet (20 mg total) by mouth daily with supper. 90 tablet 1  . simvastatin (ZOCOR) 20 MG tablet Take 20 mg by mouth every evening.     . traMADol (ULTRAM) 50 MG tablet Take 1 tablet (50 mg total) by mouth every 4 (four) hours as needed for moderate pain. 28 tablet 0   No current facility-administered medications for this visit.     Allergies:   Shellfish allergy   Social History:  The patient  reports that she quit smoking about 26 years ago. Her smoking use included cigarettes. She has a 20.00 pack-year smoking history. She has never used smokeless  tobacco. She reports current alcohol use of about 3.0 standard drinks of alcohol per week. She reports that she does not use drugs.   Family History:  The patient's family history includes Breast cancer in her sister; Breast cancer (age of onset: 35) in her mother; Cirrhosis in her mother; Heart attack in her father; Heart failure in her father; Kidney Stones in her sister.    ROS:  Please see the history of present illness.   Otherwise, review of systems is positive for back pain, easy bruising.   All other systems are reviewed and negative.    PHYSICAL EXAM: VS:  BP 110/66 (BP Location: Left Arm, Patient Position: Sitting, Cuff Size: Normal)   Pulse 99   Ht 5' 4"  (1.626 m)   Wt 124 lb 8 oz (56.5 kg)   BMI 21.37 kg/m  , BMI Body mass index is 21.37 kg/m. GEN: Well nourished, well developed elderly female, in no acute distress  HEENT: normal  Neck: no JVD, carotid bruits, or masses Cardiac: RRR; no murmurs, rubs, or gallops, no edema  Respiratory:  clear to auscultation bilaterally, normal work of breathing GI:  soft, nontender, nondistended, + BS MS: no deformity or atrophy  Neuro:  Strength and sensation are intact Psych: euthymic mood, full affect  EKG:  EKG is ordered today. Personal review of the ekg ordered shows atrial flutter HR 99, diffuse ST changes, possible dig effect, no change from baseline, PR 122, QRS 78, QTc 410   Recent Labs: 05/30/2018: NT-Pro BNP 5,122; TSH 1.200 08/16/2018: Magnesium 1.9 09/27/2018: ALT 14; BUN 14; Creatinine 0.87; Hemoglobin 11.4; Platelets 169; Potassium 4.4; Sodium 143    Lipid Panel  No results found for: CHOL, TRIG, HDL, CHOLHDL, VLDL, LDLCALC, LDLDIRECT   Wt Readings from Last 3 Encounters:  12/18/18 124 lb 8 oz (56.5 kg)  11/21/18 123 lb 10.9 oz (56.1 kg)  11/20/18 120 lb (54.4 kg)      Other studies Reviewed: Additional studies/ records that were reviewed today include: Epic notes, Echo Review of the above records today demonstrates:  Echo 10/03/18 - Left ventricle: The cavity size was normal. Systolic function was   normal. The estimated ejection fraction was in the range of 60%   to 65%. Wall motion was normal; there were no regional wall   motion abnormalities. - Aortic valve: There was no significant regurgitation. - Mitral valve: S/P MV repair. An annular ring prosthesis was   present. There was trivial perivalvular and trivial central   regurgitation. Mean gradient (D): 5 mm Hg. - Left atrium: The atrium was massively dilated. - Right ventricle: Systolic function was normal. - Atrial septum: No defect or patent foramen ovale was identified. - Tricuspid valve: There was mild-moderate regurgitation. Peak   RV-RA gradient (S): 37 mm Hg. - Pulmonic valve: There was mild regurgitation. - Pulmonary arteries: Systolic pressure was mildly to moderately   increased. - Pericardium, extracardiac: A trivial pericardial effusion was   identified posterior to the heart.   ASSESSMENT AND PLAN:  1.  Permanent atrial fibrillation S/p MAZE  08/10/18. S/p DCCV 10/05/18 with return to afib. Left atrium severly dilated on echo, she is unlikely to maintain sinus rhythm and she appears asymptomatic of her arrhythmia. Patient prefers to pursue a rate control strategy. Recent EF normal. Will continue metoprolol 100 mg BID. Continue Xarelto 20 mg daily Will stop digoxin 0.0625 mcg daily.  This patients CHA2DS2-VASc Score and unadjusted Ischemic Stroke Rate (% per year) is equal to 7.2 %  stroke rate/year from a score of 5  Above score calculated as 1 point each if present [CHF, HTN, DM, Vascular=MI/PAD/Aortic Plaque, Age if 65-74, or Female] Above score calculated as 2 points each if present [Age > 75, or Stroke/TIA/TE]   2. CAD S/p CABG x1. No anginal symptoms, continue present therapy.  3. HTN Stable, no changes today.  4. HLD Continue statin therapy    Current medicines are reviewed at length with the patient today.   The patient does not have concerns regarding her medicines.  The following changes were made today:  Stop digoxin  Labs/ tests ordered today include:  Orders Placed This Encounter  Procedures  . EKG 12-Lead    Disposition:   FU with Afib clinic in 3 months. Follow up with Dr Tamala Julian as scheduled.  Signed, Will Meredith Leeds, MD  12/18/2018 1:39 PM     [address] [id number] [phone] (office) [phone] (fax)  I have seen and examined this patient with Adline Peals.  Agree with above, note added to reflect my findings.  On exam, tachycardic regular, no murmurs, lungs clear.  Patient presents today feeling well.  She has had a Maze procedure for atrial fibrillation.  She remains in atrial flutter though it does appear to be atypical today.  She is minimally symptomatic from this.  Due to that, we will continue with a rate control strategy.  Her heart rate is in the high 90s.  We will stop her digoxin today.  Will M. Camnitz MD 12/18/2018 1:39  PM

## 2018-12-20 ENCOUNTER — Encounter (HOSPITAL_COMMUNITY)
Admission: RE | Admit: 2018-12-20 | Discharge: 2018-12-20 | Disposition: A | Discharge status: Home / Self Care | Payer: Medicare Other | Source: Ambulatory Visit | Attending: Interventional Cardiology | Admitting: Interventional Cardiology

## 2018-12-20 ENCOUNTER — Encounter (HOSPITAL_COMMUNITY): Payer: Medicare Other

## 2018-12-20 DIAGNOSIS — Z951 Presence of aortocoronary bypass graft: Secondary | ICD-10-CM | POA: Diagnosis not present

## 2018-12-20 DIAGNOSIS — Z9889 Other specified postprocedural states: Secondary | ICD-10-CM

## 2018-12-22 ENCOUNTER — Encounter (HOSPITAL_COMMUNITY): Payer: Medicare Other

## 2018-12-25 ENCOUNTER — Encounter (HOSPITAL_COMMUNITY): Payer: Medicare Other

## 2018-12-25 ENCOUNTER — Encounter (HOSPITAL_COMMUNITY)
Admission: RE | Admit: 2018-12-25 | Discharge: 2018-12-25 | Disposition: A | Discharge status: Home / Self Care | Payer: Medicare Other | Source: Ambulatory Visit | Attending: Interventional Cardiology | Admitting: Interventional Cardiology

## 2018-12-25 DIAGNOSIS — Z951 Presence of aortocoronary bypass graft: Secondary | ICD-10-CM | POA: Insufficient documentation

## 2018-12-25 DIAGNOSIS — Z9889 Other specified postprocedural states: Secondary | ICD-10-CM | POA: Diagnosis present

## 2018-12-27 ENCOUNTER — Encounter (HOSPITAL_COMMUNITY)
Admission: RE | Admit: 2018-12-27 | Discharge: 2018-12-27 | Disposition: A | Discharge status: Home / Self Care | Payer: Medicare Other | Source: Ambulatory Visit | Attending: Interventional Cardiology | Admitting: Interventional Cardiology

## 2018-12-27 ENCOUNTER — Encounter (HOSPITAL_COMMUNITY): Payer: Medicare Other

## 2018-12-27 DIAGNOSIS — Z9889 Other specified postprocedural states: Secondary | ICD-10-CM

## 2018-12-27 DIAGNOSIS — Z951 Presence of aortocoronary bypass graft: Secondary | ICD-10-CM

## 2018-12-28 NOTE — Progress Notes (Signed)
Cardiology Office Note:    Date:  12/29/2018   ID:  Michele Green, DOB 02-Jan-1941, MRN [id number]  PCP:  [name], MD  Cardiologist:  Sinclair Grooms, MD   Referring MD: [name], MD   Chief Complaint  Patient presents with  . Atrial Fibrillation  . Hypertension    History of Present Illness:    Michele Green is a 78 y.o. female with a hx of CAD s/p CABG is 23 July 2018, severe MR s/p repair September 2019, breast cancer, HTN, history of atrial fibrillation prior to open heart surgery, Maze procedure with out atrial appendage ligation September 2019, recurrent A. fib and a flutter post surgery despite amiodarone therapy and cardioversion November 2019.   Michele Green has been seen by Dr. Curt Bears relative to recurrent atrial flutter.  The decision has been made to manage this rhythm with rate control.  Resting heart rate is 98 bpm.  She is in cardiac rehab.  She has no orthopnea.  She does get dyspnea on exertion.  With activity she can feel racing heart.  Dr. Venora Maples discontinued low-dose digoxin which had been started the last time I saw her.  She denies angina.  There is no peripheral edema.  She has not had syncope.  She is enjoying cardiac rehab phase 2.  There has been no bleeding on anticoagulation.  Past Medical History:  Diagnosis Date  . Arrhythmia   . Atypical atrial flutter ([place]) 08/14/2018   Post-operative  . Breast cancer of upper-outer quadrant of left female breast ([place]) 02/02/2016  . CAD (coronary artery disease) 06/20/2018   LHC 7/19: pLAD 65/90, oD1 90, mLCx 85, OM2 50, oRCA 30, EF 50-55 >> s/p CABG in 9/19 (L-LAD)  . Colon polyp 02/2012  . Dupuytren contracture    bilateral hands  . Family history of breast cancer   . Hyperlipidemia   . Hypertension   . Mitral regurgitation 11/07/2013   Echo 7/19: Mild LVH, EF 60-65, no RWMA, mod ot severe MR, massive LAE, PASP 33 // TEE 7/19:  Mild conc LVH, EF 60-65, no RWMA, severe MR with mild post  leaflet prolapse, massive // s/p MV repair 07/2018  . On continuous oral anticoagulation 08/22/2015   Started on Xarelto 08/12/2015   . Osteopenia   . Persistent atrial fibrillation 08/22/2015   Started late August or early September 2016 // s/p Maze procedure 07/2018  . Radiation Therapy 04/21/16-05/19/16   left breast 47.72 Gy, boosted to 10 Gy  . 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 S/P Maze operation for atrial fibrillation 08/10/2018   Complete bilateral atrial lesion set using bipolar radiofrequency and cryothermy ablation with clipping of LA appendage  . S/P MVR (mitral valve repair) 08/10/2018   Complex valvuloplasty including Gore-tex neochord placement x8, Plication of Lateral Commissure and 12m Sorin Memo 4D Ring Annuloplasty SN# [id number] . Ulcerative colitis ([place]   . Wears glasses     Past Surgical History:  Procedure Laterality Date  . BREAST BIOPSY Left 01/28/2016  . BREAST LUMPECTOMY Left 02/23/2016  . BREAST LUMPECTOMY WITH RADIOACTIVE SEED LOCALIZATION Left 02/23/2016   Procedure: BREAST LUMPECTOMY WITH RADIOACTIVE SEED LOCALIZATION;  Surgeon: BExcell Seltzer MD;  [place] [facility]  Service: General;  Laterality: Left;  . CARDIAC CATHETERIZATION    . CARDIOVERSION N/A 10/02/2015   Procedure: CARDIOVERSION;  Surgeon: MJerline Pain MD;  [place] [facility]  Service: Cardiovascular;  Laterality: N/A;  .  CARDIOVERSION N/A 10/05/2018   Procedure: CARDIOVERSION;  Surgeon: Fay Records, MD;  Location: [place];  Service: Cardiovascular;  Laterality: N/A;  . CLIPPING OF ATRIAL APPENDAGE  08/10/2018   Procedure: CLIPPING OF ATRIAL APPENDAGE;  Surgeon: Rexene Alberts, MD;  Location: [place];  Service: Open Heart Surgery;;  . COLONOSCOPY    . CORONARY ARTERY BYPASS GRAFT N/A 08/10/2018   Procedure: CORONARY ARTERY BYPASS GRAFTING (CABG) x 1, LIMA-LAD,  USING LEFT INTERNAL MAMMARY ARTERY. HARVESTED RIGHT GREATER SAPHENOUS VEIN ENDOSCOPICALLY;  Surgeon:  Rexene Alberts, MD;  Location: [place];  Service: Open Heart Surgery;  Laterality: N/A;  . DUPUYTREN CONTRACTURE RELEASE  2001   leftx2  . [place]   right  . DUPUYTREN CONTRACTURE RELEASE Right 05/02/2014   Procedure: EXCISION DUPUYTRENS RIGHT PALMAR/SMALL ;  Surgeon: Cammie Sickle, MD;  Location: [place];  Service: Orthopedics;  Laterality: Right;  [name] MAZE N/A 08/10/2018   Procedure: MAZE;  Surgeon: Rexene Alberts, MD;  Location: [place];  Service: Open Heart Surgery;  Laterality: N/A;  . MITRAL VALVE REPAIR N/A 08/10/2018   Procedure: MITRAL VALVE REPAIR (MVR);  Surgeon: Rexene Alberts, MD;  Location: [place];  Service: Open Heart Surgery;  Laterality: N/A;  glutaraldehyde  . RIGHT/LEFT HEART CATH AND CORONARY ANGIOGRAPHY N/A 06/20/2018   Procedure: RIGHT/LEFT HEART CATH AND CORONARY ANGIOGRAPHY;  Surgeon: [name], MD;  Location: [place] [facility];  Service: Cardiovascular;  Laterality: N/A;  . TEE WITHOUT CARDIOVERSION N/A 06/20/2018   Procedure: TRANSESOPHAGEAL ECHOCARDIOGRAM (TEE);  Surgeon: Pixie Casino, MD;  Location: [facility] END[facility];  Service: Cardiovascular;  Laterality: N/A;  . TEE WITHOUT CARDIOVERSION N/A 08/10/2018   Procedure: TRANSESOPHAGEAL ECHOCARDIOGRAM (TEE);  Surgeon: Rexene Alberts, MD;  Location: [place];  Service: Open Heart Surgery;  Laterality: N/A;  . TONSILLECTOMY  age 48    Current Medications: Current Meds  Medication Sig  . acetaminophen (TYLENOL) 325 MG tablet Take 2 tablets (650 mg total) by mouth every 6 (six) hours as needed for mild pain or fever.  [name] allopurinol (ZYLOPRIM) 100 MG tablet Take 200 mg by mouth every evening.   [name] aspirin EC 81 MG EC tablet Take 1 tablet (81 mg total) by mouth daily.  . furosemide (LASIX) 20 MG tablet Take 20 mg by mouth daily as needed (for swelling/fluid retention.).   [name] LIALDA 1.2 G EC tablet Take 4.8 g by mouth daily.   . metoprolol tartrate (LOPRESSOR) 100 MG tablet  Take 1 tablet (100 mg total) by mouth 2 (two) times daily.  . potassium chloride SA (K-DUR,KLOR-CON) 20 MEQ tablet Take 1 tablet (20 mEq total) by mouth daily.  . rivaroxaban (XARELTO) 20 MG TABS tablet Take 1 tablet (20 mg total) by mouth daily with supper.  . simvastatin (ZOCOR) 20 MG tablet Take 20 mg by mouth every evening.   . tamoxifen (NOLVADEX) 20 MG tablet Take 20 mg by mouth daily.  . traMADol (ULTRAM) 50 MG tablet Take 1 tablet (50 mg total) by mouth every 4 (four) hours as needed for moderate pain.     Allergies:   Shellfish allergy   Social History   Socioeconomic History  . Marital status: Widowed    Spouse name: Not on file  . Number of children: 1  . Years of education: Not on file  . Highest education level: Not on file  Occupational History  . Not on file  Social Needs  .  Financial resource strain: Not very hard  . Food insecurity:    Worry: Never true    Inability: Never true  . Transportation needs:    Medical: No    Non-medical: No  Tobacco Use  . Smoking status: Former Smoker    Packs/day: 1.00    Years: 20.00    Pack years: 20.00    Types: Cigarettes    Last attempt to quit: 11/22/1992    Years since quitting: 26.1  . Smokeless tobacco: Never Used  Substance and Sexual Activity  . Alcohol use: Yes    Alcohol/week: 3.0 standard drinks    Types: 3 Standard drinks or equivalent per week    Comment: social  . Drug use: No  . Sexual activity: Not Currently    Partners: Male    Birth control/protection: Post-menopausal  Lifestyle  . Physical activity:    Days per week: 0 days    Minutes per session: 0 min  . Stress: Not on file  Relationships  . Social connections:    Talks on phone: Not on file    Gets together: Not on file    Attends religious service: Not on file    Active member of club or organization: Not on file    Attends meetings of clubs or organizations: Not on file    Relationship status: Not on file  Other Topics Concern  . Not  on file  Social History Narrative  . Not on file     Family History: The patient's family history includes Breast cancer in her sister; Breast cancer (age of onset: 60) in her mother; Cirrhosis in her mother; Heart attack in her father; Heart failure in her father; Kidney Stones in her sister. There is no history of Osteoporosis.  ROS:   Please see the history of present illness.    Easy bruising.  Notes palpitations.  Appetite is improving.  She has gained weight.  All other systems reviewed and are negative.  EKGs/Labs/Other Studies Reviewed:    The following studies were reviewed today: 2D Doppler echocardiogram October 03, 2018: Study Conclusions  - Left ventricle: The cavity size was normal. Systolic function was   normal. The estimated ejection fraction was in the range of 60%   to 65%. Wall motion was normal; there were no regional wall   motion abnormalities. - Aortic valve: There was no significant regurgitation. - Mitral valve: S/P MV repair. An annular ring prosthesis was   present. There was trivial perivalvular and trivial central   regurgitation. Mean gradient (D): 5 mm Hg. - Left atrium: The atrium was massively dilated. - Right ventricle: Systolic function was normal. - Atrial septum: No defect or patent foramen ovale was identified. - Tricuspid valve: There was mild-moderate regurgitation. Peak   RV-RA gradient (S): 37 mm Hg. - Pulmonic valve: There was mild regurgitation. - Pulmonary arteries: Systolic pressure was mildly to moderately   increased. - Pericardium, extracardiac: A trivial pericardial effusion was   identified posterior to the heart.  Impressions:  - S/P mitral valve annuloplasty with 28 mm Memo 4D ring. Normal LV   EF, trivial MR. Mild-moderate TR with RV-RA gradient of 37 mmHg.   Trivial posterior pericardial effusion.   EKG:  EKG is not repeated today.  The tracing performed on 12/18/2018 when the patient was seen by Dr. Curt Bears  reveals atrial tachycardia/flutter with 2-1 AV conduction with ventricular rate of 98 bpm.  Diffuse ST abnormality is noted.  Recent Labs: 05/30/2018: NT-Pro BNP  5,122; TSH 1.200 08/16/2018: Magnesium 1.9 09/27/2018: ALT 14; BUN 14; Creatinine 0.87; Hemoglobin 11.4; Platelets 169; Potassium 4.4; Sodium 143  Recent Lipid Panel No results found for: CHOL, TRIG, HDL, CHOLHDL, VLDL, LDLCALC, LDLDIRECT  Physical Exam:    VS:  BP 122/78   Pulse 98   Ht 5' 4"  (1.626 m)   Wt 125 lb 3.2 oz (56.8 kg)   SpO2 100%   BMI 21.49 kg/m     Wt Readings from Last 3 Encounters:  12/29/18 125 lb 3.2 oz (56.8 kg)  12/18/18 124 lb 8 oz (56.5 kg)  11/21/18 123 lb 10.9 oz (56.1 kg)     GEN: Still has not gained weight back to her usual state.  She looks healthier.  Skin is pink.. No acute distress HEENT: Normal NECK: No JVD. LYMPHATICS: No lymphadenopathy CARDIAC: RRR.  No murmur, no gallop, no edema VASCULAR: Radial 2+ bilateral pulses, no bruits RESPIRATORY:  Clear to auscultation without rales, wheezing or rhonchi  ABDOMEN: Soft, non-tender, non-distended, No pulsatile mass, MUSCULOSKELETAL: No deformity  SKIN: Warm and dry NEUROLOGIC:  Alert and oriented x 3 PSYCHIATRIC:  Normal affect   ASSESSMENT:    1. Atypical atrial flutter ([place])   2. S/P CABG x 1   3. Essential hypertension   4. Chronic diastolic HF (heart failure) ([place])   5. S/P mitral valve repair + CABG x1 + maze procedure   6. Long term (current) use of anticoagulants   7. On amiodarone therapy   8. S/P Maze operation for atrial fibrillation    PLAN:    In order of problems listed above:  1. Despite ablation/maze at the time of mitral valve repair coronary bypass grafting, the patient now has atrial flutter.  She was seen by Dr. Curt Bears who felt that flutter was atypical and would be difficult to ablate.  He discontinued amiodarone and digoxin.  She is currently on metoprolol tartrate 100 mg twice daily. 2. She is stable  without angina.  We discussed long-term secondary prevention. 3. Target blood pressure 130/80 mmHg or less. 4. There is no evidence of volume overload on low-dose furosemide. 5. A soft systolic murmur is audible.  The patient will need to be monitored for progression of aortic regurgitation.  May repeat echocardiogram late this year. 6. Continue rivaroxaban.  Monitor for bleeding. 7. Amiodarone is been discontinued.  Secondary risk prevention was discussed.Overall education related to secondary risk prevention was discussed in detail: LDL less than 70, hemoglobin A1c less than 7, blood pressure target less than 130/80 mmHg, >150 minutes of moderate aerobic activity per week, avoidance of smoking, weight control (via diet and exercise), and continued surveillance/management of/for obstructive sleep apnea.  Status post mitral repair.  Has mild residual mitral regurgitation that needs to be monitored.  Chronic anticoagulation with Xarelto or apixaban will be necessary for control of stroke risk.  Clinical follow-up in 3 to 4 months.   Medication Adjustments/Labs and Tests Ordered: Current medicines are reviewed at length with the patient today.  Concerns regarding medicines are outlined above.  No orders of the defined types were placed in this encounter.  No orders of the defined types were placed in this encounter.   Patient Instructions  Medication Instructions:  Your physician recommends that you continue on your current medications as directed. Please refer to the Current Medication list given to you today.  If you need a refill on your cardiac medications before your next appointment, please call your pharmacy.   Lab  work: None If you have labs (blood work) drawn today and your tests are completely normal, you will receive your results only by: [name] MyChart Message (if you have MyChart) OR . A paper copy in the mail If you have any lab test that is abnormal or we need to change your  treatment, we will call you to review the results.  Testing/Procedures: None  Follow-Up: At [facility], you and your health needs are our priority.  As part of our continuing mission to provide you with exceptional heart care, we have created designated Provider Care Teams.  These Care Teams include your primary Cardiologist (physician) and Advanced Practice Providers (APPs -  Physician Assistants and Nurse Practitioners) who all work together to provide you with the care you need, when you need it. You will need a follow up appointment in May or June 2020.  Please call our office 2 months in advance to schedule this appointment.  You may see Sinclair Grooms, MD or one of the following Advanced Practice Providers on your designated Care Team:   Truitt Merle, NP Cecilie Kicks, NP . Kathyrn Drown, NP  Any Other Special Instructions Will Be Listed Below (If Applicable).       Signed, Sinclair Grooms, MD  12/29/2018 3:40 PM    [facility]

## 2018-12-28 NOTE — Progress Notes (Signed)
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  acetaminophen (TYLENOL) 325 MG tablet, Take 2 tablets (650 mg total) by mouth every 6 (six) hours as needed for mild pain or fever., Disp: , Rfl:  .  allopurinol (ZYLOPRIM) 100 MG tablet, Take 200 mg by mouth every evening. , Disp: , Rfl:  .  aspirin EC 81 MG EC tablet, Take 1 tablet (81 mg total) by mouth daily., Disp: , Rfl:  .  furosemide (LASIX) 20 MG tablet, Take 20 mg by mouth daily as needed (for swelling/fluid retention.). , Disp: , Rfl:  .  LIALDA 1.2 G EC tablet, Take 4.8 g by mouth daily. , Disp: , Rfl:  .  metoprolol tartrate (LOPRESSOR) 100 MG tablet, Take 1 tablet (100 mg total) by mouth 2 (two) times daily., Disp: , Rfl:  .  potassium chloride SA (K-DUR,KLOR-CON) 20 MEQ tablet, Take 1 tablet (20 mEq total) by mouth daily., Disp: 90 tablet, Rfl: 3 .  rivaroxaban (XARELTO) 20 MG TABS tablet, Take 1 tablet (20 mg total) by mouth daily with supper., Disp: 90 tablet, Rfl: 1 .  simvastatin (ZOCOR) 20 MG tablet, Take 20 mg by mouth every evening. , Disp: , Rfl:  .  traMADol (ULTRAM) 50 MG tablet, Take 1 tablet (50 mg total) by mouth every 4 (four) hours as needed for moderate pain., Disp: 28 tablet, Rfl: 0  Past Medical History: Past Medical History:  Diagnosis Date  . Arrhythmia   . Atypical atrial flutter ([place]) 08/14/2018    Post-operative  . Breast cancer of upper-outer quadrant of left female breast ([place]) 02/02/2016  . CAD (coronary artery disease) 06/20/2018   LHC 7/19: pLAD 65/90, oD1 90, mLCx 85, OM2 50, oRCA 30, EF 50-55 >> s/p CABG in 9/19 (L-LAD)  . Colon polyp 02/2012  . Dupuytren contracture    bilateral hands  . Family history of breast cancer   . Hyperlipidemia   . Hypertension   . Mitral regurgitation 11/07/2013   Echo 7/19: Mild LVH, EF 60-65, no RWMA, mod ot severe MR, massive LAE, PASP 33 // TEE 7/19:  Mild conc LVH, EF 60-65, no RWMA, severe MR with mild post leaflet prolapse, massive // s/p MV repair 07/2018  . On continuous oral anticoagulation 08/22/2015   Started on Xarelto 08/12/2015   . Osteopenia   . Persistent atrial fibrillation 08/22/2015   Started late August or early September 2016 // s/p Maze procedure 07/2018  . Radiation Therapy 04/21/16-05/19/16   left breast 47.72 Gy, boosted to 10 Gy  . 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 S/P Maze operation for atrial fibrillation 08/10/2018   Complete bilateral atrial lesion set using bipolar radiofrequency and cryothermy ablation with clipping of LA appendage  . 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 Ulcerative colitis ([place])   . Wears glasses     Tobacco Use: Social History   Tobacco Use  Smoking Status Former Smoker  . Packs/day: 1.00  . Years: 20.00  . Pack years: 20.00  . Types: Cigarettes  . Last attempt to quit: 11/22/1992  . Years since quitting: 26.1  Smokeless Tobacco Never Used    Labs: Recent Chemical engineer    Labs for ITP Cardiac and Pulmonary Rehab Latest Ref Rng & Units 08/10/2018 08/10/2018 08/10/2018 08/10/2018 08/11/2018   Hemoglobin A1c 4.8 - 5.6 % - - - - -   PHART 7.350 - 7.450 7.305(L) 7.348(L) 7.314(L) - -   PCO2ART 32.0 - 48.0 mmHg 42.3 32.8 42.9 - -    HCO3 20.0 - 28.0 mmol/L 21.5 18.0(L) 21.8 - -   TCO2 22 - 32 mmol/L 23 19(L) 23 23 24    ACIDBASEDEF 0.0 - 2.0 mmol/L 5.0(H) 7.0(H) 4.0(H) - -   O2SAT % 98.0 94.0 98.0 - -      Capillary Blood Glucose: Lab Results  Component Value Date   GLUCAP 88 08/14/2018   GLUCAP 94 08/13/2018   GLUCAP 87 08/13/2018   GLUCAP 101 (H) 08/13/2018   GLUCAP 103 (H) 08/13/2018     Exercise Target Goals: Exercise Program Goal: Individual exercise prescription set using results from initial 6 min walk test and THRR while considering  patient's activity barriers and safety.   Exercise Prescription Goal: Initial exercise prescription builds to 30-45 minutes a day of aerobic activity, 2-3 days per week.  Home exercise guidelines will be given to patient during program as part of exercise prescription that the participant will acknowledge.  Activity Barriers & Risk Stratification: Activity Barriers & Cardiac Risk Stratification - 11/21/18 1351    Activity Barriers & Cardiac Risk Stratification          Activity Barriers  Back Problems    Cardiac Risk Stratification  High           6 Minute Walk: 6 Minute Walk    6 Minute Walk    Row Name 11/21/18 1400   Phase  Initial   Distance  1303 feet   Walk Time  6 minutes   # of Rest Breaks  0   MPH  2.47   METS  2.69   RPE  9   Perceived Dyspnea   0   VO2 Peak  9.43   Symptoms  No   Resting HR  95 bpm   Resting BP  104/62   Resting Oxygen Saturation   95 %   Exercise Oxygen Saturation  during 6 min walk  93 %   Max Ex. HR  119 bpm   Max Ex. BP  118/80   2 Minute Post BP  104/62          Oxygen Initial Assessment:   Oxygen Re-Evaluation:   Oxygen Discharge (Final Oxygen Re-Evaluation):   Initial Exercise Prescription: Initial Exercise Prescription - 11/21/18 1500    Date of Initial Exercise RX and Referring Provider          Date  11/21/18    Referring Provider  [name]., MD    Expected Discharge Date  02/26/19         Recumbant Bike          Level  2    Watts  15    Minutes  10    METs  2.8  NuStep          Level  2    SPM  85    Minutes  10    METs  2.5        Track          Laps  9    Minutes  10    METs  2.57        Prescription Details          Frequency (times per week)  2    Duration  Progress to 30 minutes of continuous aerobic without signs/symptoms of physical distress        Intensity          THRR 40-80% of Max Heartrate  56-113    Ratings of Perceived Exertion  11-13    Perceived Dyspnea  0-4        Progression          Progression  Continue to progress workloads to maintain intensity without signs/symptoms of physical distress.        Resistance Training          Training Prescription  Yes    Weight  2lbs    Reps  10-15           Perform Capillary Blood Glucose checks as needed.  Exercise Prescription Changes: Exercise Prescription Changes    Response to Exercise    Row Name 11/27/18 1329 12/25/18 1500   Blood Pressure (Admit)  120/80  120/70   Blood Pressure (Exercise)  132/80  120/78   Blood Pressure (Exit)  120/80  108/70   Heart Rate (Admit)  97 bpm  92 bpm   Heart Rate (Exercise)  104 bpm  104 bpm   Heart Rate (Exit)  100 bpm  91 bpm   Rating of Perceived Exertion (Exercise)  13  11   Symptoms  none  none   Duration  Progress to 30 minutes of  aerobic without signs/symptoms of physical distress  Progress to 30 minutes of  aerobic without signs/symptoms of physical distress   Intensity  THRR unchanged  THRR unchanged       Progression    Row Name 11/27/18 1329 12/25/18 1500   Progression  Continue to progress workloads to maintain intensity without signs/symptoms of physical distress.  Continue to progress workloads to maintain intensity without signs/symptoms of physical distress.   Average METs  1.9  2.3       Resistance Training    Row Name 11/27/18 1329 12/25/18 1500   Training Prescription  Yes  Yes   Weight  2lbs  2lbs    Reps  10-15  10-15   Time  10 Minutes  10 Minutes       Interval Training    Row Name 11/27/18 1329 12/25/18 1500   Interval Training  No  No       Recumbant Bike    Row Name 11/27/18 1329 12/25/18 1500   Level  2  2   Watts  15  15   Minutes  10  10   METs  2  2.3       NuStep    Row Name 11/27/18 1329 12/25/18 1500   Level  2  2   SPM  85  85   Minutes  20  20   METs  1.8  1.8       Track    Row Name 11/27/18 1329 12/25/18 1500   Laps  no documentation  13   Minutes  no documentation  10   METs  no documentation  2.74          Exercise Comments: Exercise Comments    Row Name 11/27/18 1424 12/28/18 1041   Exercise Comments  Patient tolerated 1st session of exercise well without c/o.  Reviewed METs and goals with Pt. Tolerating exercise well.       Exercise Goals and Review: Exercise Goals    Exercise Goals    Row Name 11/21/18 1352   Increase Physical Activity  Yes   Intervention  Provide advice, education, support and counseling about physical activity/exercise needs.;Develop an individualized exercise prescription for aerobic and resistive training based on initial evaluation findings, risk stratification, comorbidities and participant's personal goals.   Expected Outcomes  Short Term: Attend rehab on a regular basis to increase amount of physical activity.;Long Term: Add in home exercise to make exercise part of routine and to increase amount of physical activity.;Long Term: Exercising regularly at least 3-5 days a week.   Increase Strength and Stamina  Yes   Intervention  Provide advice, education, support and counseling about physical activity/exercise needs.;Develop an individualized exercise prescription for aerobic and resistive training based on initial evaluation findings, risk stratification, comorbidities and participant's personal goals.   Expected Outcomes  Short Term: Increase workloads from initial exercise prescription for resistance, speed, and  METs.;Short Term: Perform resistance training exercises routinely during rehab and add in resistance training at home;Long Term: Improve cardiorespiratory fitness, muscular endurance and strength as measured by increased METs and functional capacity (6MWT)   Able to understand and use rate of perceived exertion (RPE) scale  Yes   Intervention  Provide education and explanation on how to use RPE scale   Expected Outcomes  Short Term: Able to use RPE daily in rehab to express subjective intensity level;Long Term:  Able to use RPE to guide intensity level when exercising independently   Knowledge and understanding of Target Heart Rate Range (THRR)  Yes   Intervention  Provide education and explanation of THRR including how the numbers were predicted and where they are located for reference   Expected Outcomes  Short Term: Able to state/look up THRR;Long Term: Able to use THRR to govern intensity when exercising independently;Short Term: Able to use daily as guideline for intensity in rehab   Understanding of Exercise Prescription  Yes   Intervention  Provide education, explanation, and written materials on patient's individual exercise prescription   Expected Outcomes  Short Term: Able to explain program exercise prescription;Long Term: Able to explain home exercise prescription to exercise independently          Exercise Goals Re-Evaluation : Exercise Goals Re-Evaluation    Exercise Goal Re-Evaluation    Row Name 11/27/18 1424 12/28/18 1039   Exercise Goals Review  Increase Physical Activity;Able to understand and use rate of perceived exertion (RPE) scale  Increase Physical Activity;Increase Strength and Stamina;Understanding of Exercise Prescription;Knowledge and understanding of Target Heart Rate Range (THRR);Able to understand and use rate of perceived exertion (RPE) scale;Able to check pulse independently   Comments  Patient able to understand and use RPE scale appropriately.  Reviewed METs  and goals with Pt. Pt MET level is a 2.3 and is increasing gradually. Pt is not currently exercising at home. Encouraged Pt to walk 2-3 days per week for 30 minutes in addition to the program.    Expected Outcomes  Increase workloads as tolerated to help improve cardiorespiratory fitness.  Will continue to monitor and progress Pt as tolerated.           Discharge Exercise Prescription (Final Exercise Prescription Changes): Exercise Prescription Changes - 12/25/18 1500    Response to Exercise          Blood Pressure (Admit)  120/70    Blood Pressure (Exercise)  120/78    Blood Pressure (Exit)  108/70    Heart Rate (Admit)  92 bpm    Heart Rate (Exercise)  104 bpm    Heart Rate (Exit)  91 bpm    Rating of Perceived Exertion (Exercise)  11    Symptoms  none    Duration  Progress to 30 minutes of  aerobic without signs/symptoms of physical distress    Intensity  THRR unchanged        Progression          Progression  Continue to progress workloads to maintain intensity without signs/symptoms of physical distress.    Average METs  2.3        Resistance Training          Training Prescription  Yes    Weight  2lbs    Reps  10-15    Time  10 Minutes        Interval Training          Interval Training  No        Recumbant Bike          Level  2    Watts  15    Minutes  10    METs  2.3        NuStep          Level  2    SPM  85    Minutes  20    METs  1.8        Track          Laps  13    Minutes  10    METs  2.74           Nutrition:  Target Goals: Understanding of nutrition guidelines, daily intake of sodium <1563m, cholesterol <2014m calories 30% from fat and 7% or less from saturated fats, daily to have 5 or more servings of fruits and vegetables.  Biometrics: Pre Biometrics - 11/21/18 1331    Pre Biometrics          Height  5' 3"  (1.6 m)    Weight  56.1 kg    Waist Circumference  31.75 inches    Hip Circumference  36.5 inches    Waist to Hip  Ratio  0.87 %    BMI (Calculated)  21.91    Triceps Skinfold  11 mm    % Body Fat  31 %    Grip Strength  24.5 kg    Flexibility  --   not performed: back issues    Single Leg Stand  4.28 seconds            Nutrition Therapy Plan and Nutrition Goals: Nutrition Therapy & Goals - 11/24/18 1158    Nutrition Therapy          Diet  heart healthy        Personal Nutrition Goals          Nutrition Goal  Pt to eat minimum of 3 meals daily    Personal Goal #2  pt to continue to follow heart healthy diet  Intervention Plan          Intervention  Prescribe, educate and counsel regarding individualized specific dietary modifications aiming towards targeted core components such as weight, hypertension, lipid management, diabetes, heart failure and other comorbidities.    Expected Outcomes  Long Term Goal: Adherence to prescribed nutrition plan.           Nutrition Assessments: Nutrition Assessments - 11/21/18 1347    MEDFICTS Scores          Pre Score  6           Nutrition Goals Re-Evaluation:   Nutrition Goals Re-Evaluation:   Nutrition Goals Discharge (Final Nutrition Goals Re-Evaluation):   Psychosocial: Target Goals: Acknowledge presence or absence of significant depression and/or stress, maximize coping skills, provide positive support system. Participant is able to verbalize types and ability to use techniques and skills needed for reducing stress and depression.  Initial Review & Psychosocial Screening: Initial Psych Review & Screening - 11/21/18 1502    Initial Review          Current issues with  Current Stress Concerns    Source of Stress Concerns  Family    Comments  Pat's husband passed away last year from brain [place]?  Yes   [name] has her sister for support in town and a son who lives 2 hours away       Barriers          Psychosocial barriers to participate in program  The patient  should benefit from training in stress management and relaxation.        Screening Interventions          Interventions  Encouraged to exercise    Expected Outcomes  Long Term Goal: Stressors or current issues are controlled or eliminated.           Quality of Life Scores: Quality of Life - 11/21/18 1513    Quality of Life          Select  Quality of Life        Quality of Life Scores          Health/Function Pre  28.5 %    Socioeconomic Pre  30 %    Psych/Spiritual Pre  30 %    Family Pre  30 %    GLOBAL Pre  29.4 %          Scores of 19 and below usually indicate a poorer quality of life in these areas.  A difference of  2-3 points is a clinically meaningful difference.  A difference of 2-3 points in the total score of the Quality of Life Index has been associated with significant improvement in overall quality of life, self-image, physical symptoms, and general health in studies assessing change in quality of life.  PHQ-9: Recent Review Flowsheet Data    Depression screen [facility] 2/9 11/27/2018 12/30/2016 06/24/2016   Decreased Interest 0 0 0   Down, Depressed, Hopeless 0 - 0   PHQ - 2 Score 0 0 0     Interpretation of Total Score  Total Score Depression Severity:  1-4 = Minimal depression, 5-9 = Mild depression, 10-14 = Moderate depression, 15-19 = Moderately severe depression, 20-27 = Severe depression   Psychosocial Evaluation and Intervention: Psychosocial Evaluation - 11/27/18 1424    Psychosocial Evaluation & Interventions  Interventions  Encouraged to exercise with the program and follow exercise prescription;Stress management education;Relaxation education    Comments  pt currently exhibits normal grief pattern.  otherwise no psychosocial needs identified, no interventions necessary. pt enjoys spending time with people and animals.     Expected Outcomes  pt will exhibit positive outlook with good coping skills.      Continue Psychosocial Services   No  Follow up required           Psychosocial Re-Evaluation: Psychosocial Re-Evaluation    Psychosocial Re-Evaluation    Row Name 12/28/18 [id number]   Current issues with  None Identified   Comments  no psychosocial needs identified, no interventions necessary.    Expected Outcomes  pt will exhibit positive outlook with good coping skills.    Interventions  Encouraged to attend Cardiac Rehabilitation for the exercise   Continue Psychosocial Services   No Follow up required          Psychosocial Discharge (Final Psychosocial Re-Evaluation): Psychosocial Re-Evaluation - 12/28/18 4270    Psychosocial Re-Evaluation          Current issues with  None Identified    Comments  no psychosocial needs identified, no interventions necessary.     Expected Outcomes  pt will exhibit positive outlook with good coping skills.     Interventions  Encouraged to attend Cardiac Rehabilitation for the exercise    Continue Psychosocial Services   No Follow up required           Vocational Rehabilitation: Provide vocational rehab assistance to qualifying candidates.   Vocational Rehab Evaluation & Intervention: Vocational Rehab - 11/21/18 1515    Initial Vocational Rehab Evaluation & Intervention          Assessment shows need for Vocational Rehabilitation  No           Education: Education Goals: Education classes will be provided on a weekly basis, covering required topics. Participant will state understanding/return demonstration of topics presented.  Learning Barriers/Preferences: Learning Barriers/Preferences - 11/21/18 1509    Learning Barriers/Preferences          Learning Barriers  Sight   glasses   Learning Preferences  Pictoral;Written Material           Education Topics: Count Your Pulse:  -Group instruction provided by verbal instruction, demonstration, patient participation and written materials to support subject.  Instructors address importance of being able to find your  pulse and how to count your pulse when at home without a heart monitor.  Patients get hands on experience counting their pulse with staff help and individually.   Heart Attack, Angina, and Risk Factor Modification:  -Group instruction provided by verbal instruction, video, and written materials to support subject.  Instructors address signs and symptoms of angina and heart attacks.    Also discuss risk factors for heart disease and how to make changes to improve heart health risk factors.   Functional Fitness:  -Group instruction provided by verbal instruction, demonstration, patient participation, and written materials to support subject.  Instructors address safety measures for doing things around the house.  Discuss how to get up and down off the floor, how to pick things up properly, how to safely get out of a chair without assistance, and balance training.   Meditation and Mindfulness:  -Group instruction provided by verbal instruction, patient participation, and written materials to support subject.  Instructor addresses importance of mindfulness and meditation practice to help reduce stress and improve awareness.  Instructor also leads participants through a meditation exercise.    Stretching for Flexibility and Mobility:  -Group instruction provided by verbal instruction, patient participation, and written materials to support subject.  Instructors lead participants through series of stretches that are designed to increase flexibility thus improving mobility.  These stretches are additional exercise for major muscle groups that are typically performed during regular warm up and cool down.   Hands Only CPR:  -Group verbal, video, and participation provides a basic overview of AHA guidelines for community CPR. Role-play of emergencies allow participants the opportunity to practice calling for help and chest compression technique with discussion of AED use.   Hypertension: -Group verbal  and written instruction that provides a basic overview of hypertension including the most recent diagnostic guidelines, risk factor reduction with self-care instructions and medication management.    Nutrition I class: Heart Healthy Eating:  -Group instruction provided by PowerPoint slides, verbal discussion, and written materials to support subject matter. The instructor gives an explanation and review of the Therapeutic Lifestyle Changes diet recommendations, which includes a discussion on lipid goals, dietary fat, sodium, fiber, plant stanol/sterol esters, sugar, and the components of a well-balanced, healthy diet.   Nutrition II class: Lifestyle Skills:  -Group instruction provided by PowerPoint slides, verbal discussion, and written materials to support subject matter. The instructor gives an explanation and review of label reading, grocery shopping for heart health, heart healthy recipe modifications, and ways to make healthier choices when eating out.   Diabetes Question & Answer:  -Group instruction provided by PowerPoint slides, verbal discussion, and written materials to support subject matter. The instructor gives an explanation and review of diabetes co-morbidities, pre- and post-prandial blood glucose goals, pre-exercise blood glucose goals, signs, symptoms, and treatment of hypoglycemia and hyperglycemia, and foot care basics.   Diabetes Blitz:  -Group instruction provided by PowerPoint slides, verbal discussion, and written materials to support subject matter. The instructor gives an explanation and review of the physiology behind type 1 and type 2 diabetes, diabetes medications and rational behind using different medications, pre- and post-prandial blood glucose recommendations and Hemoglobin A1c goals, diabetes diet, and exercise including blood glucose guidelines for exercising safely.    Portion Distortion:  -Group instruction provided by PowerPoint slides, verbal discussion,  written materials, and food models to support subject matter. The instructor gives an explanation of serving size versus portion size, changes in portions sizes over the last 20 years, and what consists of a serving from each food group.   Stress Management:  -Group instruction provided by verbal instruction, video, and written materials to support subject matter.  Instructors review role of stress in heart disease and how to cope with stress positively.     Exercising on Your Own:  -Group instruction provided by verbal instruction, power point, and written materials to support subject.  Instructors discuss benefits of exercise, components of exercise, frequency and intensity of exercise, and end points for exercise.  Also discuss use of nitroglycerin and activating EMS.  Review options of places to exercise outside of rehab.  Review guidelines for sex with heart disease.   Cardiac Drugs I:  -Group instruction provided by verbal instruction and written materials to support subject.  Instructor reviews cardiac drug classes: antiplatelets, anticoagulants, beta blockers, and statins.  Instructor discusses reasons, side effects, and lifestyle considerations for each drug class.   Cardiac Drugs II:  -Group instruction provided by verbal instruction and written materials to support subject.  Instructor reviews cardiac drug classes:  angiotensin converting enzyme inhibitors (ACE-I), angiotensin II receptor blockers (ARBs), nitrates, and calcium channel blockers.  Instructor discusses reasons, side effects, and lifestyle considerations for each drug class.   Anatomy and Physiology of the Circulatory System:  Group verbal and written instruction and models provide basic cardiac anatomy and physiology, with the coronary electrical and arterial systems. Review of: AMI, Angina, Valve disease, Heart Failure, Peripheral Artery Disease, Cardiac Arrhythmia, Pacemakers, and the ICD. Flowsheet Row CARDIAC REHAB  PHASE II EXERCISE from 12/27/2018 in [place]  Date  12/27/18  Instruction Review Code  2- Demonstrated Understanding      Other Education:  -Group or individual verbal, written, or video instructions that support the educational goals of the cardiac rehab program.   Holiday Eating Survival Tips:  -Group instruction provided by PowerPoint slides, verbal discussion, and written materials to support subject matter. The instructor gives patients tips, tricks, and techniques to help them not only survive but enjoy the holidays despite the onslaught of food that accompanies the holidays.   Knowledge Questionnaire Score: Knowledge Questionnaire Score - 11/21/18 1509    Knowledge Questionnaire Score          Pre Score  19/24           Core Components/Risk Factors/Patient Goals at Admission: Personal Goals and Risk Factors at Admission - 11/21/18 1515    Core Components/Risk Factors/Patient Goals on Admission           Weight Management  Weight Gain;Yes    Intervention  Weight Management: Develop a combined nutrition and exercise program designed to reach desired caloric intake, while maintaining appropriate intake of nutrient and fiber, sodium and fats, and appropriate energy expenditure required for the weight goal.;Weight Management: Provide education and appropriate resources to help participant work on and attain dietary goals.    Expected Outcomes  Weight Gain: Understanding of general recommendations for a high calorie, high protein meal plan that promotes weight gain by distributing calorie intake throughout the day with the consumption for 4-5 meals, snacks, and/or supplements;Weight Maintenance: Understanding of the daily nutrition guidelines, which includes 25-35% calories from fat, 7% or less cal from saturated fats, less than 246m cholesterol, less than 1.5gm of sodium, & 5 or more servings of fruits and vegetables daily;Understanding  recommendations for meals to include 15-35% energy as protein, 25-35% energy from fat, 35-60% energy from carbohydrates, less than 2017mof dietary cholesterol, 20-35 gm of total fiber daily;Understanding of distribution of calorie intake throughout the day with the consumption of 4-5 meals/snacks    Hypertension  Yes    Intervention  Provide education on lifestyle modifcations including regular physical activity/exercise, weight management, moderate sodium restriction and increased consumption of fresh fruit, vegetables, and low fat dairy, alcohol moderation, and smoking cessation.;Monitor prescription use compliance.    Expected Outcomes  Short Term: Continued assessment and intervention until BP is < 140/9033mG in hypertensive participants. < 130/62m85m in hypertensive participants with diabetes, heart failure or chronic kidney disease.;Long Term: Maintenance of blood pressure at goal levels.    Lipids  Yes    Intervention  Provide education and support for participant on nutrition & aerobic/resistive exercise along with prescribed medications to achieve LDL <70mg79mL >40mg.88mExpected Outcomes  Short Term: Participant states understanding of desired cholesterol values and is compliant with medications prescribed. Participant is following exercise prescription and nutrition guidelines.;Long Term: Cholesterol controlled with medications as prescribed, with individualized exercise RX and with personalized nutrition  plan. Value goals: LDL < 27m, HDL > 40 mg.    Stress  Yes    Intervention  Offer individual and/or small group education and counseling on adjustment to heart disease, stress management and health-related lifestyle change. Teach and support self-help strategies.;Refer participants experiencing significant psychosocial distress to appropriate mental health specialists for further evaluation and treatment. When possible, include family members and significant others in education/counseling  sessions.    Expected Outcomes  Short Term: Participant demonstrates changes in health-related behavior, relaxation and other stress management skills, ability to obtain effective social support, and compliance with psychotropic medications if prescribed.;Long Term: Emotional wellbeing is indicated by absence of clinically significant psychosocial distress or social isolation.           Core Components/Risk Factors/Patient Goals Review:  Goals and Risk Factor Review    Core Components/Risk Factors/Patient Goals Review    Row Name 11/27/18 1425 12/28/18 0836   Personal Goals Review  Weight Management/Obesity;Hypertension;Lipids;Stress  Weight Management/Obesity;Hypertension;Lipids;Stress   Review  pt with multiple CAD RF demonstrates willingness to participate in CR program. pt eager to increase strength/stamina and decrease abdominal fat.    pt with multiple CAD RF demonstrates willingness to participate in CR program. pt eager to increase strength/stamina and decrease abdominal fat.  pt has continued DOE wtih more than usual activity.  pt is doing some stretching and walking at home.  pt lives alone.    Expected Outcomes  pt will participate in CR exercise, nutrition and lifstyle modification to decrease overall RF.   pt will participate in CR exercise, nutrition and lifstyle modification to decrease overall RF.           Core Components/Risk Factors/Patient Goals at Discharge (Final Review):  Goals and Risk Factor Review - 12/28/18 0836    Core Components/Risk Factors/Patient Goals Review          Personal Goals Review  Weight Management/Obesity;Hypertension;Lipids;Stress    Review  pt with multiple CAD RF demonstrates willingness to participate in CR program. pt eager to increase strength/stamina and decrease abdominal fat.  pt has continued DOE wtih more than usual activity.  pt is doing some stretching and walking at home.  pt lives alone.     Expected Outcomes  pt will participate in  CR exercise, nutrition and lifstyle modification to decrease overall RF.            ITP Comments: ITP Comments    Row Name 11/21/18 1501 11/27/18 1423 12/28/18 0832   ITP Comments  Dr TFransico Him Medical Director  pt started group exercise program. pt tolerated light activity without difficulty. pt oriented to exercise equipment and safety routine. understanding verbalized.   30 day ITP review. pt with good attendance and participation.  pt demonstrates increasing willingness to participate in group exercise setting.        Comments:

## 2018-12-29 ENCOUNTER — Other Ambulatory Visit: Payer: Medicare Other | Admitting: *Deleted

## 2018-12-29 ENCOUNTER — Ambulatory Visit: Payer: Medicare Other | Admitting: Interventional Cardiology

## 2018-12-29 ENCOUNTER — Encounter (HOSPITAL_COMMUNITY): Payer: Medicare Other

## 2018-12-29 ENCOUNTER — Encounter: Payer: Self-pay | Admitting: Interventional Cardiology

## 2018-12-29 ENCOUNTER — Encounter (HOSPITAL_COMMUNITY): Admission: RE | Admit: 2018-12-29 | Payer: Medicare Other | Source: Ambulatory Visit

## 2018-12-29 VITALS — BP 122/78 | HR 98 | Ht 64.0 in | Wt 125.2 lb

## 2018-12-29 DIAGNOSIS — Z79899 Other long term (current) drug therapy: Secondary | ICD-10-CM

## 2018-12-29 DIAGNOSIS — I484 Atypical atrial flutter: Secondary | ICD-10-CM

## 2018-12-29 DIAGNOSIS — I5032 Chronic diastolic (congestive) heart failure: Secondary | ICD-10-CM | POA: Diagnosis not present

## 2018-12-29 DIAGNOSIS — I10 Essential (primary) hypertension: Secondary | ICD-10-CM

## 2018-12-29 DIAGNOSIS — Z951 Presence of aortocoronary bypass graft: Secondary | ICD-10-CM | POA: Diagnosis not present

## 2018-12-29 DIAGNOSIS — Z8679 Personal history of other diseases of the circulatory system: Secondary | ICD-10-CM

## 2018-12-29 DIAGNOSIS — Z9889 Other specified postprocedural states: Secondary | ICD-10-CM

## 2018-12-29 DIAGNOSIS — Z7901 Long term (current) use of anticoagulants: Secondary | ICD-10-CM

## 2018-12-29 NOTE — Patient Instructions (Signed)
Medication Instructions:  Your physician recommends that you continue on your current medications as directed. Please refer to the Current Medication list given to you today.  If you need a refill on your cardiac medications before your next appointment, please call your pharmacy.   Lab work: None If you have labs (blood work) drawn today and your tests are completely normal, you will receive your results only by: [name] MyChart Message (if you have MyChart) OR . A paper copy in the mail If you have any lab test that is abnormal or we need to change your treatment, we will call you to review the results.  Testing/Procedures: None  Follow-Up: At [facility], you and your health needs are our priority.  As part of our continuing mission to provide you with exceptional heart care, we have created designated Provider Care Teams.  These Care Teams include your primary Cardiologist (physician) and Advanced Practice Providers (APPs -  Physician Assistants and Nurse Practitioners) who all work together to provide you with the care you need, when you need it. You will need a follow up appointment in May or June 2020.  Please call our office 2 months in advance to schedule this appointment.  You may see Sinclair Grooms, MD or one of the following Advanced Practice Providers on your designated Care Team:   Truitt Merle, NP Cecilie Kicks, NP . Kathyrn Drown, NP  Any Other Special Instructions Will Be Listed Below (If Applicable).

## 2018-12-30 LAB — CBC
Hematocrit: 36.5 % (ref 34.0–46.6)
Hemoglobin: 12.7 g/dL (ref 11.1–15.9)
MCH: 36.7 pg — AB (ref 26.6–33.0)
MCHC: 34.8 g/dL (ref 31.5–35.7)
MCV: 106 fL — ABNORMAL HIGH (ref 79–97)
Platelets: 135 10*3/uL — ABNORMAL LOW (ref 150–450)
RBC: 3.46 x10E6/uL — ABNORMAL LOW (ref 3.77–5.28)
RDW: 13.7 % (ref 11.7–15.4)
WBC: 4.1 10*3/uL (ref 3.4–10.8)

## 2018-12-30 LAB — BASIC METABOLIC PANEL
BUN / CREAT RATIO: 19 (ref 12–28)
BUN: 16 mg/dL (ref 8–27)
CO2: 22 mmol/L (ref 20–29)
Calcium: 9.5 mg/dL (ref 8.7–10.3)
Chloride: 103 mmol/L (ref 96–106)
Creatinine, Ser: 0.84 mg/dL (ref 0.57–1.00)
GFR calc Af Amer: 76 mL/min/{1.73_m2} (ref >59)
GFR calc non Af Amer: 66 mL/min/{1.73_m2} (ref >59)
Glucose: 80 mg/dL (ref 65–99)
Potassium: 4.4 mmol/L (ref 3.5–5.2)
Sodium: 142 mmol/L (ref 134–144)

## 2019-01-01 ENCOUNTER — Encounter (HOSPITAL_COMMUNITY): Payer: Medicare Other

## 2019-01-01 ENCOUNTER — Encounter (HOSPITAL_COMMUNITY)
Admission: RE | Admit: 2019-01-01 | Discharge: 2019-01-01 | Disposition: A | Discharge status: Home / Self Care | Payer: Medicare Other | Source: Ambulatory Visit | Attending: Interventional Cardiology | Admitting: Interventional Cardiology

## 2019-01-01 DIAGNOSIS — Z951 Presence of aortocoronary bypass graft: Secondary | ICD-10-CM

## 2019-01-01 DIAGNOSIS — Z9889 Other specified postprocedural states: Secondary | ICD-10-CM

## 2019-01-02 ENCOUNTER — Other Ambulatory Visit: Payer: Self-pay | Admitting: Pharmacist

## 2019-01-02 MED ORDER — RIVAROXABAN 15 MG PO TABS: 15.0000 mg | ORAL_TABLET | Freq: Every day | ORAL | 1 refills | Status: DC

## 2019-01-02 NOTE — Telephone Encounter (Signed)
BMET and CBC checked 1 month after starting Xarelto. CrCl now 47.58m/min and pt qualifies for lower dose of Xarelto 122mdaily. Refill sent in and pt made aware of dose change.

## 2019-01-03 ENCOUNTER — Encounter (HOSPITAL_COMMUNITY)
Admission: RE | Admit: 2019-01-03 | Discharge: 2019-01-03 | Disposition: A | Discharge status: Home / Self Care | Payer: Medicare Other | Source: Ambulatory Visit | Attending: Interventional Cardiology | Admitting: Interventional Cardiology

## 2019-01-03 ENCOUNTER — Encounter (HOSPITAL_COMMUNITY): Payer: Medicare Other

## 2019-01-03 DIAGNOSIS — Z9889 Other specified postprocedural states: Secondary | ICD-10-CM

## 2019-01-03 DIAGNOSIS — Z951 Presence of aortocoronary bypass graft: Secondary | ICD-10-CM

## 2019-01-05 ENCOUNTER — Encounter (HOSPITAL_COMMUNITY): Payer: Medicare Other

## 2019-01-08 ENCOUNTER — Encounter (HOSPITAL_COMMUNITY): Payer: Medicare Other

## 2019-01-08 ENCOUNTER — Encounter (HOSPITAL_COMMUNITY)
Admission: RE | Admit: 2019-01-08 | Discharge: 2019-01-08 | Disposition: A | Discharge status: Home / Self Care | Payer: Medicare Other | Source: Ambulatory Visit | Attending: Interventional Cardiology | Admitting: Interventional Cardiology

## 2019-01-08 DIAGNOSIS — Z951 Presence of aortocoronary bypass graft: Secondary | ICD-10-CM | POA: Diagnosis not present

## 2019-01-08 DIAGNOSIS — Z9889 Other specified postprocedural states: Secondary | ICD-10-CM

## 2019-01-10 ENCOUNTER — Encounter (HOSPITAL_COMMUNITY): Payer: Medicare Other

## 2019-01-10 ENCOUNTER — Encounter (HOSPITAL_COMMUNITY)
Admission: RE | Admit: 2019-01-10 | Discharge: 2019-01-10 | Disposition: A | Discharge status: Home / Self Care | Payer: Medicare Other | Source: Ambulatory Visit | Attending: Interventional Cardiology | Admitting: Interventional Cardiology

## 2019-01-10 DIAGNOSIS — Z951 Presence of aortocoronary bypass graft: Secondary | ICD-10-CM

## 2019-01-10 DIAGNOSIS — Z9889 Other specified postprocedural states: Secondary | ICD-10-CM

## 2019-01-12 ENCOUNTER — Encounter (HOSPITAL_COMMUNITY): Payer: Medicare Other

## 2019-01-15 ENCOUNTER — Encounter (HOSPITAL_COMMUNITY): Payer: Medicare Other

## 2019-01-15 ENCOUNTER — Encounter (HOSPITAL_COMMUNITY)
Admission: RE | Admit: 2019-01-15 | Discharge: 2019-01-15 | Disposition: A | Discharge status: Home / Self Care | Payer: Medicare Other | Source: Ambulatory Visit | Attending: Interventional Cardiology | Admitting: Interventional Cardiology

## 2019-01-15 DIAGNOSIS — Z951 Presence of aortocoronary bypass graft: Secondary | ICD-10-CM | POA: Diagnosis not present

## 2019-01-15 DIAGNOSIS — Z9889 Other specified postprocedural states: Secondary | ICD-10-CM

## 2019-01-17 ENCOUNTER — Encounter (HOSPITAL_COMMUNITY): Payer: Medicare Other

## 2019-01-17 ENCOUNTER — Encounter (HOSPITAL_COMMUNITY)
Admission: RE | Admit: 2019-01-17 | Discharge: 2019-01-17 | Disposition: A | Discharge status: Home / Self Care | Payer: Medicare Other | Source: Ambulatory Visit | Attending: Interventional Cardiology | Admitting: Interventional Cardiology

## 2019-01-17 DIAGNOSIS — Z9889 Other specified postprocedural states: Secondary | ICD-10-CM

## 2019-01-17 DIAGNOSIS — Z951 Presence of aortocoronary bypass graft: Secondary | ICD-10-CM | POA: Diagnosis not present

## 2019-01-19 ENCOUNTER — Encounter (HOSPITAL_COMMUNITY): Payer: Medicare Other

## 2019-01-22 ENCOUNTER — Encounter (HOSPITAL_COMMUNITY): Payer: Medicare Other

## 2019-01-22 ENCOUNTER — Encounter (HOSPITAL_COMMUNITY)
Admission: RE | Admit: 2019-01-22 | Discharge: 2019-01-22 | Disposition: A | Discharge status: Home / Self Care | Payer: Medicare Other | Source: Ambulatory Visit | Attending: Interventional Cardiology | Admitting: Interventional Cardiology

## 2019-01-22 DIAGNOSIS — Z951 Presence of aortocoronary bypass graft: Secondary | ICD-10-CM | POA: Diagnosis not present

## 2019-01-22 DIAGNOSIS — Z9889 Other specified postprocedural states: Secondary | ICD-10-CM | POA: Insufficient documentation

## 2019-01-24 ENCOUNTER — Encounter (HOSPITAL_COMMUNITY)
Admission: RE | Admit: 2019-01-24 | Discharge: 2019-01-24 | Disposition: A | Discharge status: Home / Self Care | Payer: Medicare Other | Source: Ambulatory Visit | Attending: Interventional Cardiology | Admitting: Interventional Cardiology

## 2019-01-24 ENCOUNTER — Encounter (HOSPITAL_COMMUNITY): Payer: Medicare Other

## 2019-01-24 DIAGNOSIS — Z951 Presence of aortocoronary bypass graft: Secondary | ICD-10-CM

## 2019-01-24 DIAGNOSIS — Z9889 Other specified postprocedural states: Secondary | ICD-10-CM

## 2019-01-24 NOTE — Progress Notes (Signed)
[facility]   [phone] [phone]   Clinic Follow up Note   Patient Care Team: [name], MD as PCP - General (Family Medicine) [name], MD as PCP - Cardiology (Cardiology) Constance Haw, MD as PCP - Electrophysiology (Cardiology)  Date of Service:  01/26/2019  CHIEF COMPLAINT: Follow up  left breast cancer  SUMMARY OF ONCOLOGIC HISTORY: Oncology History   Breast cancer of upper-outer quadrant of left female breast [facility])   Staging form: Breast, AJCC 7th Edition   - Clinical stage from 01/28/2016: Stage IA (T1a, N0, M0) - Signed by Truitt Merle, MD on 02/03/2016   - Pathologic stage from 02/23/2016: Stage IA (T1b, N0, cM0) - Signed by Truitt Merle, MD on 06/14/2016        Breast cancer of upper-outer quadrant of left female breast ([place])   01/14/2016 Mammogram    Screening mammogram showed possible mass with distortion in the left breast.    01/23/2016 Imaging    Diagnostic mammogram and ultrasound of the left breast showed a 3-4 mm shadowing mass at 1:00 in the left breast, no adenopathy.    01/28/2016 Initial Diagnosis    Breast cancer of upper-outer quadrant of left female breast ([place])    01/28/2016 Initial Biopsy    Left breast mass biopsy showed invasive ductal carcinoma, grade 1.    01/28/2016 Receptors her2    Your 100% positive, PR 100% positive, HER-2 negative, Ki-67 5%    02/23/2016 Surgery    Left breast lumpectomy, and sentinel lymph node biopsy    02/23/2016 Pathology Results    Left breast lumpectomy showed a 1.0 cm invasive ductal carcinoma, grade 1, no lymphvascular invasion, invasive carcinoma is broadly less than 0.1 cm to the anterior inferior margin, ADH, ALH, 1 node(-). Left inferior reexcision margin showed LCIS     04/21/2016 - 05/19/2016 Radiation Therapy    adjuvant breast radiation     06/10/2016 -  Anti-estrogen oral therapy    Anastrozole 1 mg daily.  Changed to Tamoxifen due to osteoporosis on 09/30/17, which was  held in Sep 2019 for her open heart surgery and restarted in 10/2018      02/03/2017 Mammogram    Bilateral diagnostic mammogram 02/03/17 IMPRESSION: Expected surgical changes in the upper-outer quadrant of the left breast. No mammographic evidence of malignancy in the bilateral breasts.    02/20/2018 Mammogram    02/20/2018 Mammogram IMPRESSION: No evidence of malignancy within either breast. Stable postsurgical changes within the left breast.      CURRENT THERAPY:  1. Anastrozole 42m daily, started in 05/2016, changed to Tamoxifen due to osteoporosis on 09/30/17, which was held in Sep 2019 for her open heart surgery and restarted in 10/2018 2. Prolia injections every 6 months with Dr. sBrigitte Pulse INTERVAL HISTORY:  Michele GENGLERis here for a follow up of left breast cancer. She presents to the clinic today by herself. She notes her open heart surgery was 07/2018. She notes she is dong well. She has some breast numbness since. She is doing cardiac rehab which is helping her. She plans to complete in 02/2018.  She was on Coumadin and now currently on Xarelto. She notes she is on Tamoxifen and tolerated well with mild hot flashes. She notes having back pain from past surgery but this is manageable.     REVIEW OF SYSTEMS:   Constitutional: Denies fevers, chills or abnormal weight loss (+) Mild hot flashes  Eyes: Denies blurriness of  vision Ears, nose, mouth, throat, and face: Denies mucositis or sore throat Respiratory: Denies cough, dyspnea or wheezes Cardiovascular: Denies palpitation, chest discomfort or lower extremity swelling Gastrointestinal:  Denies nausea, heartburn or change in bowel habits Skin: Denies abnormal skin rashes MSK: (+) back pain  Lymphatics: Denies new lymphadenopathy or easy bruising Neurological:Denies numbness, tingling or new weaknesses Behavioral/Psych: Mood is stable, no new changes  All other systems were reviewed with the patient and are  negative.  MEDICAL HISTORY:  Past Medical History:  Diagnosis Date  . Arrhythmia   . Atypical atrial flutter ([place]) 08/14/2018   Post-operative  . Breast cancer of upper-outer quadrant of left female breast ([place]) 02/02/2016  . CAD (coronary artery disease) 06/20/2018   LHC 7/19: pLAD 65/90, oD1 90, mLCx 85, OM2 50, oRCA 30, EF 50-55 >> s/p CABG in 9/19 (L-LAD)  . Colon polyp 02/2012  . Dupuytren contracture    bilateral hands  . Family history of breast cancer   . Hyperlipidemia   . Hypertension   . Mitral regurgitation 11/07/2013   Echo 7/19: Mild LVH, EF 60-65, no RWMA, mod ot severe MR, massive LAE, PASP 33 // TEE 7/19:  Mild conc LVH, EF 60-65, no RWMA, severe MR with mild post leaflet prolapse, massive // s/p MV repair 07/2018  . On continuous oral anticoagulation 08/22/2015   Started on Xarelto 08/12/2015   . Osteopenia   . Persistent atrial fibrillation 08/22/2015   Started late August or early September 2016 // s/p Maze procedure 07/2018  . Radiation Therapy 04/21/16-05/19/16   left breast 47.72 Gy, boosted to 10 Gy  . 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 S/P Maze operation for atrial fibrillation 08/10/2018   Complete bilateral atrial lesion set using bipolar radiofrequency and cryothermy ablation with clipping of LA appendage  . S/P MVR (mitral valve repair) 08/10/2018   Complex valvuloplasty including Gore-tex neochord placement x8, Plication of Lateral Commissure and 68m Sorin Memo 4D Ring Annuloplasty SN# [id number] . Ulcerative colitis ([place]   . Wears glasses     SURGICAL HISTORY: Past Surgical History:  Procedure Laterality Date  . BREAST BIOPSY Left 01/28/2016  . BREAST LUMPECTOMY Left 02/23/2016  . BREAST LUMPECTOMY WITH RADIOACTIVE SEED LOCALIZATION Left 02/23/2016   Procedure: BREAST LUMPECTOMY WITH RADIOACTIVE SEED LOCALIZATION;  Surgeon: BExcell Seltzer MD;  [place]  Service: General;  Laterality: Left;  . CARDIAC CATHETERIZATION     . CARDIOVERSION N/A 10/02/2015   Procedure: CARDIOVERSION;  Surgeon: MJerline Pain MD;  [facility]  Service: Cardiovascular;  Laterality: N/A;  . CARDIOVERSION N/A 10/05/2018   Procedure: CARDIOVERSION;  Surgeon: RFay Records MD;  [place] [facility]  Service: Cardiovascular;  Laterality: N/A;  . CLIPPING OF ATRIAL APPENDAGE  08/10/2018   Procedure: CLIPPING OF ATRIAL APPENDAGE;  Surgeon: ORexene Alberts MD;  [place] [facility]  Service: Open Heart Surgery;;  . COLONOSCOPY    . CORONARY ARTERY BYPASS GRAFT N/A 08/10/2018   Procedure: CORONARY ARTERY BYPASS GRAFTING (CABG) x 1, LIMA-LAD,  USING LEFT INTERNAL MAMMARY ARTERY. HARVESTED RIGHT GREATER SAPHENOUS VEIN ENDOSCOPICALLY;  Surgeon: ORexene Alberts MD;  [place] [facility]  Service: Open Heart Surgery;  Laterality: N/A;  . DUPUYTREN CONTRACTURE RELEASE  2001   leftx2  . [place]  right  . DUPUYTREN CONTRACTURE RELEASE Right 05/02/2014   Procedure: EXCISION DUPUYTRENS RIGHT PALMAR/SMALL ;  Surgeon: RCammie Sickle MD;  Location: [place];  Service: Orthopedics;  Laterality: Right;  [name] MAZE N/A 08/10/2018   Procedure: MAZE;  Surgeon: Rexene Alberts, MD;  Location: [place];  Service: Open Heart Surgery;  Laterality: N/A;  . MITRAL VALVE REPAIR N/A 08/10/2018   Procedure: MITRAL VALVE REPAIR (MVR);  Surgeon: Rexene Alberts, MD;  Location: [place];  Service: Open Heart Surgery;  Laterality: N/A;  glutaraldehyde  . RIGHT/LEFT HEART CATH AND CORONARY ANGIOGRAPHY N/A 06/20/2018   Procedure: RIGHT/LEFT HEART CATH AND CORONARY ANGIOGRAPHY;  Surgeon: [name], MD;  Location: [place] [facility];  Service: Cardiovascular;  Laterality: N/A;  . TEE WITHOUT CARDIOVERSION N/A 06/20/2018   Procedure: TRANSESOPHAGEAL ECHOCARDIOGRAM (TEE);  Surgeon: Pixie Casino, MD;  Location: [facility];  Service: Cardiovascular;  Laterality: N/A;  . TEE WITHOUT CARDIOVERSION N/A 08/10/2018    Procedure: TRANSESOPHAGEAL ECHOCARDIOGRAM (TEE);  Surgeon: Rexene Alberts, MD;  Location: [place];  Service: Open Heart Surgery;  Laterality: N/A;  . TONSILLECTOMY  age 5    I have reviewed the social history and family history with the patient and they are unchanged from previous note.  ALLERGIES:  is allergic to shellfish allergy.  MEDICATIONS:  Current Outpatient Medications  Medication Sig Dispense Refill  . acetaminophen (TYLENOL) 325 MG tablet Take 2 tablets (650 mg total) by mouth every 6 (six) hours as needed for mild pain or fever.    [name] allopurinol (ZYLOPRIM) 100 MG tablet Take 200 mg by mouth every evening.     [name] aspirin EC 81 MG EC tablet Take 1 tablet (81 mg total) by mouth daily.    . furosemide (LASIX) 20 MG tablet Take 20 mg by mouth daily as needed (for swelling/fluid retention.).     [name] LIALDA 1.2 G EC tablet Take 4.8 g by mouth daily.     . metoprolol tartrate (LOPRESSOR) 100 MG tablet Take 1 tablet (100 mg total) by mouth 2 (two) times daily.    . potassium chloride SA (K-DUR,KLOR-CON) 20 MEQ tablet Take 1 tablet (20 mEq total) by mouth daily. 90 tablet 3  . Rivaroxaban (XARELTO) 15 MG TABS tablet Take 1 tablet (15 mg total) by mouth daily with supper. 90 tablet 1  . simvastatin (ZOCOR) 20 MG tablet Take 20 mg by mouth every evening.     . tamoxifen (NOLVADEX) 20 MG tablet Take 20 mg by mouth daily.     No current facility-administered medications for this visit.     PHYSICAL EXAMINATION: ECOG PERFORMANCE STATUS: 1 - Symptomatic but completely ambulatory  Vitals:   01/26/19 1148  BP: (!) 139/102  Pulse: (!) 103  Resp: 20  Temp: 97.7 F (36.5 C)  SpO2: 96%   Filed Weights   01/26/19 1148  Weight: 126 lb 12.8 oz (57.5 kg)    GENERAL:alert, no distress and comfortable SKIN: skin color, texture, turgor are normal, no rashes or significant lesions (+) diffuse bruising of skin  EYES: normal, Conjunctiva are pink and non-injected, sclera clear OROPHARYNX:no  exudate, no erythema and lips, buccal mucosa, and tongue normal  NECK: supple, thyroid normal size, non-tender, without nodularity LYMPH:  no palpable lymphadenopathy in the cervical, axillary or inguinal LUNGS: clear to auscultation and percussion with normal breathing effort HEART: regular rate & rhythm and no murmurs and no lower extremity edema ABDOMEN:abdomen soft, non-tender and normal bowel sounds Musculoskeletal:no cyanosis of digits and no clubbing  NEURO: alert & oriented x 3 with fluent speech, no focal motor/sensory deficits BREAST:  S/p left breast lumpectomy: Surgical incision healed well with mild scar tissue, breast mildly tender. (+) No other palpable mass or adenopathy   LABORATORY DATA:  I have reviewed the data as listed CBC Latest Ref Rng & Units 01/26/2019 12/29/2018 09/27/2018  WBC 4.0 - 10.5 K/uL 3.5(L) 4.1 4.7  Hemoglobin 12.0 - 15.0 g/dL 12.6 12.7 11.4(L)  Hematocrit 36.0 - 46.0 % 38.2 36.5 35.7(L)  Platelets 150 - 400 K/uL 124(L) 135(L) 169     CMP Latest Ref Rng & Units 12/29/2018 09/27/2018 08/22/2018  Glucose 65 - 99 mg/dL 80 83 89  BUN 8 - 27 mg/dL 16 14 14   Creatinine 0.57 - 1.00 mg/dL 0.84 0.87 0.99  Sodium 134 - 144 mmol/L 142 143 141  Potassium 3.5 - 5.2 mmol/L 4.4 4.4 4.1  Chloride 96 - 106 mmol/L 103 107 99  CO2 20 - 29 mmol/L 22 26 25   Calcium 8.7 - 10.3 mg/dL 9.5 8.9 8.9  Total Protein 6.5 - 8.1 g/dL - 6.8 -  Total Bilirubin 0.3 - 1.2 mg/dL - 0.5 -  Alkaline Phos 38 - 126 U/L - 73 -  AST 15 - 41 U/L - 24 -  ALT 0 - 44 U/L - 14 -      RADIOGRAPHIC STUDIES: I have personally reviewed the radiological images as listed and agreed with the findings in the report. No results found.   ASSESSMENT & PLAN:  Michele Green is a 78 y.o. female with   1. Breast cancer of upper-outer quadrant of left female breast, G1 invasive ductal carcinoma, pT1bN0M0, stage IA, ER+/PR+/HER2- -She was diagnosed in 01/2016. She is s/p left breast lumpectomy and adjuvant  radiation.  -She started anti-estrogen therapy with anastrozole in 05/2016. Due to osteoporosis she was switched to Tamoxifen therapy. Tolerable with mild hot flashes. Plan to complete in summer 2022.  -She is clinically doing well from a breast cancer standpoint. Lab reviewed, her CBC WNL except WBC at 3.5, PLT at 124K, due to difficult IV access, no other lab was drawn.. Her physical exam and her 02/2018 mammogram were unremarkable. There is no clinical concern for recurrence. -Next mammogram in 02/2019 -Continue Tamoxifen. Plan for a total of 5 years adjuvant antiestrogen therapy, until July 2022. -F/u in 6 months    2. Osteoporosis  -She had spinal fracture after a mild fall in 2018, she recovered well. -07/15/17 bone density shows osteoporosis with the lowest T-Score in the left femur neck, -3.3, which has definitely gotten worse since 2014, when her T score was -2.2 -continue Prolia injections with Dr. Brigitte Pulse. I strongly encouraged her to start calcium supplement and continue Vitamin D.  -Next DEXA in 06/2019  3. HTN, CAD, AF, mitral regurgitation  -She'll continue follow-up with her primary care physician and cardiologist. -She is s/p CABGx1, mitral valve repair and maze operation for atrial fibrillation on 08/10/2018. Recovered well.  -No Afib on Exam today (01/26/19), intermittent -Currently undergoing cardiac rehab  -She was previously on Coumadin, now on Xarelto. Continue   4. Genetics testing was negative  5. Macrocytic anemia  -Developed anemia after her open heart surgery, improving -She has had microcytosis even before her open heart surgery -folic acid, G28, methylmalonic acid level will be drawn on next visit   Plan -Continue Tamoxifen  -Lab and f/u in 6 months  -Mammogram in 02/2019    No problem-specific Assessment & Plan notes found for this encounter.   Orders Placed This Encounter  Procedures  . MM DIAG  BREAST TOMO BILATERAL    Standing Status:   Future     Standing Expiration Date:   01/26/2020    Order Specific Question:   Reason for Exam (SYMPTOM  OR DIAGNOSIS REQUIRED)    Answer:   screening    Order Specific Question:   Preferred imaging location?    Answer:   [facility]   All questions were answered. The patient knows to call the clinic with any problems, questions or concerns. No barriers to learning was detected. I spent 15 minutes counseling the patient face to face. The total time spent in the appointment was 20 minutes and more than 50% was on counseling and review of test results     Truitt Merle, MD 01/26/2019   I, Joslyn Devon, am acting as scribe for Truitt Merle, MD.   I have reviewed the above documentation for accuracy and completeness, and I agree with the above.

## 2019-01-24 NOTE — Progress Notes (Signed)
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  acetaminophen (TYLENOL) 325 MG tablet, Take 2 tablets (650 mg total) by mouth every 6 (six) hours as needed for mild pain or fever., Disp: , Rfl:  .  allopurinol (ZYLOPRIM) 100 MG tablet, Take 200 mg by mouth every evening. , Disp: , Rfl:  .  aspirin EC 81 MG EC tablet, Take 1 tablet (81 mg total) by mouth daily., Disp: , Rfl:  .  furosemide (LASIX) 20 MG tablet, Take 20 mg by mouth daily as needed (for swelling/fluid retention.). , Disp: , Rfl:  .  LIALDA 1.2 G EC tablet, Take 4.8 g by mouth daily. , Disp: , Rfl:  .  metoprolol tartrate (LOPRESSOR) 100 MG tablet, Take 1 tablet (100 mg total) by mouth 2 (two) times daily., Disp: , Rfl:  .  potassium chloride SA (K-DUR,KLOR-CON) 20 MEQ tablet, Take 1 tablet (20 mEq total) by mouth daily., Disp: 90 tablet, Rfl: 3 .  Rivaroxaban (XARELTO) 15 MG TABS tablet, Take 1 tablet (15 mg total) by mouth daily with supper., Disp: 90 tablet, Rfl: 1 .  simvastatin (ZOCOR) 20 MG tablet, Take 20 mg by mouth every evening. , Disp: , Rfl:  .  tamoxifen (NOLVADEX) 20 MG tablet, Take 20 mg by mouth daily., Disp: , Rfl:  .  traMADol (ULTRAM) 50 MG tablet, Take 1 tablet (50 mg total) by mouth every 4 (four) hours as needed for moderate pain., Disp: 28 tablet, Rfl: 0  Past Medical History: Past Medical History:   Diagnosis Date  . Arrhythmia   . Atypical atrial flutter ([place]) 08/14/2018   Post-operative  . Breast cancer of upper-outer quadrant of left female breast ([place]) 02/02/2016  . CAD (coronary artery disease) 06/20/2018   LHC 7/19: pLAD 65/90, oD1 90, mLCx 85, OM2 50, oRCA 30, EF 50-55 >> s/p CABG in 9/19 (L-LAD)  . Colon polyp 02/2012  . Dupuytren contracture    bilateral hands  . Family history of breast cancer   . Hyperlipidemia   . Hypertension   . Mitral regurgitation 11/07/2013   Echo 7/19: Mild LVH, EF 60-65, no RWMA, mod ot severe MR, massive LAE, PASP 33 // TEE 7/19:  Mild conc LVH, EF 60-65, no RWMA, severe MR with mild post leaflet prolapse, massive // s/p MV repair 07/2018  . On continuous oral anticoagulation 08/22/2015   Started on Xarelto 08/12/2015   . Osteopenia   . Persistent atrial fibrillation 08/22/2015   Started late August or early September 2016 // s/p Maze procedure 07/2018  . Radiation Therapy 04/21/16-05/19/16   left breast 47.72 Gy, boosted to 10 Gy  . 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 S/P Maze operation for atrial fibrillation 08/10/2018   Complete bilateral atrial lesion set using bipolar radiofrequency and cryothermy ablation with clipping of LA appendage  . 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 [place]   . Wears glasses     Tobacco Use: Social History   Tobacco Use  Smoking Status Former Smoker  . Packs/day: 1.00  . Years: 20.00  . Pack years: 20.00  . Types: Cigarettes  . Last attempt to quit: 11/22/1992  . Years since quitting: 26.1  Smokeless Tobacco Never Used    Labs: Recent RChemical engineer   Labs for ITP Cardiac and Pulmonary Rehab Latest Ref Rng & Units 08/10/2018 08/10/2018 08/10/2018 08/10/2018 08/11/2018   Hemoglobin A1c 4.8 - 5.6 % - - - - -   PHART 7.350 -  7.450 7.305(L) 7.348(L) 7.314(L) - -   PCO2ART 32.0 - 48.0 mmHg 42.3 32.8 42.9 - -   HCO3 20.0 - 28.0 mmol/L 21.5 18.0(L) 21.8 - -   TCO2 22 - 32 mmol/L 23 19(L) _0 ACIDBASEDEF 0.0 - 2.0 mmol/L 5.0(H) 7.0(H) 4.0(H) - -   O2SAT % 98.0 94.0 98.0 - -      Capillary Blood Glucose: Lab Results  Component Value Date   GLUCAP 88 08/14/2018   GLUCAP 94 08/13/2018   GLUCAP 87 08/13/2018   GLUCAP 101 (H) 08/13/2018   GLUCAP 103 (H) 08/13/2018     Exercise Target Goals: Exercise Program Goal: Individual exercise prescription set using results from initial 6 min walk test and THRR while considering  patient's activity barriers and safety.   Exercise Prescription Goal: Initial exercise prescription builds to 30-45 minutes a day of aerobic activity, 2-3 days per week.  Home exercise guidelines will be given to patient during program as part of exercise prescription that the participant will acknowledge.  Activity Barriers & Risk Stratification: Activity Barriers & Cardiac Risk Stratification - 11/21/18 1351    Activity Barriers & Cardiac Risk Stratification          Activity Barriers  Back Problems    Cardiac Risk Stratification  High           6 Minute Walk: 6 Minute Walk    6 Minute Walk    Row Name 11/21/18 1400   Phase  Initial   Distance  1303 feet   Walk Time  6 minutes   # of Rest Breaks  0   MPH  2.47   METS  2.69   RPE  9   Perceived Dyspnea   0   VO2 Peak  9.43   Symptoms  No   Resting HR  95 bpm   Resting BP  104/62   Resting Oxygen Saturation   95 %   Exercise Oxygen Saturation  during 6 min walk  93 %   Max Ex. HR  119 bpm   Max Ex. BP  118/80   2 Minute Post BP  104/62          Oxygen Initial Assessment:   Oxygen Re-Evaluation:   Oxygen Discharge (Final Oxygen Re-Evaluation):   Initial Exercise Prescription: Initial Exercise Prescription - 11/21/18 1500    Date of Initial Exercise RX and Referring Provider          Date  11/21/18     Referring Provider  SBelva Crome, MD    Expected Discharge Date  02/26/19        Recumbant Bike          Level  2  Watts  15    Minutes  10    METs  2.8        NuStep          Level  2    SPM  85    Minutes  10    METs  2.5        Track          Laps  9    Minutes  10    METs  2.57        Prescription Details          Frequency (times per week)  2    Duration  Progress to 30 minutes of continuous aerobic without signs/symptoms of physical distress        Intensity          THRR 40-80% of Max Heartrate  56-113    Ratings of Perceived Exertion  11-13    Perceived Dyspnea  0-4        Progression          Progression  Continue to progress workloads to maintain intensity without signs/symptoms of physical distress.        Resistance Training          Training Prescription  Yes    Weight  2lbs    Reps  10-15           Perform Capillary Blood Glucose checks as needed.  Exercise Prescription Changes: Exercise Prescription Changes    Response to Exercise    Row Name 11/27/18 1329 12/25/18 1500 01/16/19 1400   Blood Pressure (Admit)  120/80  120/70  120/80   Blood Pressure (Exercise)  132/80  120/78  130/70   Blood Pressure (Exit)  120/80  108/70  106/68   Heart Rate (Admit)  97 bpm  92 bpm  82 bpm   Heart Rate (Exercise)  104 bpm  104 bpm  115 bpm   Heart Rate (Exit)  100 bpm  91 bpm  95 bpm   Rating of Perceived Exertion (Exercise)  _0 Symptoms  none  none  none   Duration  Progress to 30 minutes of  aerobic without signs/symptoms of physical distress  Progress to 30 minutes of  aerobic without signs/symptoms of physical distress  Progress to 30 minutes of  aerobic without signs/symptoms of physical distress   Intensity  THRR unchanged  THRR unchanged  THRR unchanged       Progression    Row Name 11/27/18 1329 12/25/18 1500 01/16/19 1400   Progression  Continue to progress workloads to maintain intensity without signs/symptoms of  physical distress.  Continue to progress workloads to maintain intensity without signs/symptoms of physical distress.  Continue to progress workloads to maintain intensity without signs/symptoms of physical distress.   Average METs  1.9  2.3  2.5       Resistance Training    Row Name 11/27/18 1329 12/25/18 1500 01/16/19 1400   Training Prescription  Yes  Yes  Yes   Weight  2lbs  2lbs  3 lbs.    Reps  10-15  10-15  10-15   Time  10 Minutes  10 Minutes  [place] Name 11/27/18 1329 12/25/18 1500 01/16/19 1400   Interval Training  No  No  No       Recumbant Bike    Row Name 11/27/18 1329 12/25/18  1500 01/16/19 1400   Level  _0 Watts  _1 Minutes  _2 METs  2  2.3  2.6       NuStep    Row Name 11/27/18 1329 12/25/18 1500 01/16/19 1400   Level  _3 SPM  85  85  85   Minutes  _4 METs  1.8  1.8  2.1       Track    Row Name 11/27/18 1329 12/25/18 1500 01/16/19 1400   Laps  no documentation  13  16   Minutes  no documentation  10  10   METs  no documentation  2.74  3.76          Exercise Comments: Exercise Comments    Row Name 11/27/18 1424 12/28/18 1041 01/23/19 1119   Exercise Comments  Patient tolerated 1st session of exercise well without c/o.  Reviewed METs and goals with Pt. Tolerating exercise well.   Reviewed METs and goals with Pt. Tolerating exercise well.       Exercise Goals and Review: Exercise Goals    Exercise Goals    Row Name 11/21/18 1352   Increase Physical Activity  Yes   Intervention  Provide advice, education, support and counseling about physical activity/exercise needs.;Develop an individualized exercise prescription for aerobic and resistive training based on initial evaluation findings, risk stratification, comorbidities and participant's personal goals.   Expected Outcomes  Short Term: Attend rehab on a regular basis to increase amount of physical activity.;Long Term: Add  in home exercise to make exercise part of routine and to increase amount of physical activity.;Long Term: Exercising regularly at least 3-5 days a week.   Increase Strength and Stamina  Yes   Intervention  Provide advice, education, support and counseling about physical activity/exercise needs.;Develop an individualized exercise prescription for aerobic and resistive training based on initial evaluation findings, risk stratification, comorbidities and participant's personal goals.   Expected Outcomes  Short Term: Increase workloads from initial exercise prescription for resistance, speed, and METs.;Short Term: Perform resistance training exercises routinely during rehab and add in resistance training at home;Long Term: Improve cardiorespiratory fitness, muscular endurance and strength as measured by increased METs and functional capacity (6MWT)   Able to understand and use rate of perceived exertion (RPE) scale  Yes   Intervention  Provide education and explanation on how to use RPE scale   Expected Outcomes  Short Term: Able to use RPE daily in rehab to express subjective intensity level;Long Term:  Able to use RPE to guide intensity level when exercising independently   Knowledge and understanding of Target Heart Rate Range (THRR)  Yes   Intervention  Provide education and explanation of THRR including how the numbers were predicted and where they are located for reference   Expected Outcomes  Short Term: Able to state/look up THRR;Long Term: Able to use THRR to govern intensity when exercising independently;Short Term: Able to use daily as guideline for intensity in rehab   Understanding of Exercise Prescription  Yes   Intervention  Provide education, explanation, and written materials on patient's individual exercise prescription   Expected Outcomes  Short Term: Able to explain program exercise prescription;Long Term: Able to explain home exercise prescription to exercise independently           Exercise Goals Re-Evaluation : Exercise Goals Re-Evaluation  Exercise Goal Re-Evaluation    Row Name 11/27/18 1424 12/28/18 1039 01/23/19 1118   Exercise Goals Review  Increase Physical Activity;Able to understand and use rate of perceived exertion (RPE) scale  Increase Physical Activity;Increase Strength and Stamina;Understanding of Exercise Prescription;Knowledge and understanding of Target Heart Rate Range (THRR);Able to understand and use rate of perceived exertion (RPE) scale;Able to check pulse independently  Increase Physical Activity;Increase Strength and Stamina;Understanding of Exercise Prescription;Knowledge and understanding of Target Heart Rate Range (THRR);Able to understand and use rate of perceived exertion (RPE) scale;Able to check pulse independently   Comments  Patient able to understand and use RPE scale appropriately.  Reviewed METs and goals with Pt. Pt MET level is a 2.3 and is increasing gradually. Pt is not currently exercising at home. Encouraged Pt to walk 2-3 days per week for 30 minutes in addition to the program.   Reviewed METs and goals with Pt. Pt MET level is a 2.5 and is increasing gradually. Pt is walking 2-3 days per week for 30 minutes in addition to the program.    Expected Outcomes  Increase workloads as tolerated to help improve cardiorespiratory fitness.  Will continue to monitor and progress Pt as tolerated.   Will continue to monitor and progress Pt as tolerated.           Discharge Exercise Prescription (Final Exercise Prescription Changes): Exercise Prescription Changes - 01/16/19 1400    Response to Exercise          Blood Pressure (Admit)  120/80    Blood Pressure (Exercise)  130/70    Blood Pressure (Exit)  106/68    Heart Rate (Admit)  82 bpm    Heart Rate (Exercise)  115 bpm    Heart Rate (Exit)  95 bpm    Rating of Perceived Exertion (Exercise)  12    Symptoms  none    Duration  Progress to 30 minutes of  aerobic without  signs/symptoms of physical distress    Intensity  THRR unchanged        Progression          Progression  Continue to progress workloads to maintain intensity without signs/symptoms of physical distress.    Average METs  2.5        Resistance Training          Training Prescription  Yes    Weight  3 lbs.     Reps  10-15    Time  10 Minutes        Interval Training          Interval Training  No        Recumbant Bike          Level  2    Watts  15    Minutes  10    METs  2.6        NuStep          Level  2    SPM  85    Minutes  20    METs  2.1        Track          Laps  16    Minutes  10    METs  3.76           Nutrition:  Target Goals: Understanding of nutrition guidelines, daily intake of sodium <1535m, cholesterol <2064m calories 30% from fat and 7% or less from saturated fats, daily to have 5 or more  servings of fruits and vegetables.  Biometrics: Pre Biometrics - 11/21/18 1331    Pre Biometrics          Height  5' 3" (1.6 m)    Weight  56.1 kg    Waist Circumference  31.75 inches    Hip Circumference  36.5 inches    Waist to Hip Ratio  0.87 %    BMI (Calculated)  21.91    Triceps Skinfold  11 mm    % Body Fat  31 %    Grip Strength  24.5 kg    Flexibility  --   not performed: back issues    Single Leg Stand  4.28 seconds            Nutrition Therapy Plan and Nutrition Goals: Nutrition Therapy & Goals - 11/24/18 1158    Nutrition Therapy          Diet  heart healthy        Personal Nutrition Goals          Nutrition Goal  Pt to eat minimum of 3 meals daily    Personal Goal #2  pt to continue to follow heart healthy diet        Intervention Plan          Intervention  Prescribe, educate and counsel regarding individualized specific dietary modifications aiming towards targeted core components such as weight, hypertension, lipid management, diabetes, heart failure and other comorbidities.    Expected Outcomes  Long Term  Goal: Adherence to prescribed nutrition plan.           Nutrition Assessments: Nutrition Assessments - 11/21/18 1347    MEDFICTS Scores          Pre Score  6           Nutrition Goals Re-Evaluation:   Nutrition Goals Re-Evaluation:   Nutrition Goals Discharge (Final Nutrition Goals Re-Evaluation):   Psychosocial: Target Goals: Acknowledge presence or absence of significant depression and/or stress, maximize coping skills, provide positive support system. Participant is able to verbalize types and ability to use techniques and skills needed for reducing stress and depression.  Initial Review & Psychosocial Screening: Initial Psych Review & Screening - 11/21/18 1502    Initial Review          Current issues with  Current Stress Concerns    Source of Stress Concerns  Family    Comments  Pat's husband passed away last year from brain [place]?  Yes   [name] has her sister for support in town and a son who lives 2 hours away       Barriers          Psychosocial barriers to participate in program  The patient should benefit from training in stress management and relaxation.        Screening Interventions          Interventions  Encouraged to exercise    Expected Outcomes  Long Term Goal: Stressors or current issues are controlled or eliminated.           Quality of Life Scores: Quality of Life - 11/21/18 1513    Quality of Life          Select  Quality of Life        Quality of Life Scores  Health/Function Pre  28.5 %    Socioeconomic Pre  30 %    Psych/Spiritual Pre  30 %    Family Pre  30 %    GLOBAL Pre  29.4 %          Scores of 19 and below usually indicate a poorer quality of life in these areas.  A difference of  2-3 points is a clinically meaningful difference.  A difference of 2-3 points in the total score of the Quality of Life Index has been associated with significant improvement in  overall quality of life, self-image, physical symptoms, and general health in studies assessing change in quality of life.  PHQ-9: Recent Review Flowsheet Data    Depression screen [facility] 2/9 11/27/2018 12/30/2016 06/24/2016   Decreased Interest 0 0 0   Down, Depressed, Hopeless 0 - 0   PHQ - 2 Score 0 0 0     Interpretation of Total Score  Total Score Depression Severity:  1-4 = Minimal depression, 5-9 = Mild depression, 10-14 = Moderate depression, 15-19 = Moderately severe depression, 20-27 = Severe depression   Psychosocial Evaluation and Intervention: Psychosocial Evaluation - 11/27/18 1424    Psychosocial Evaluation & Interventions          Interventions  Encouraged to exercise with the program and follow exercise prescription;Stress management education;Relaxation education    Comments  pt currently exhibits normal grief pattern.  otherwise no psychosocial needs identified, no interventions necessary. pt enjoys spending time with people and animals.     Expected Outcomes  pt will exhibit positive outlook with good coping skills.      Continue Psychosocial Services   No Follow up required           Psychosocial Re-Evaluation: Psychosocial Re-Evaluation    Psychosocial Re-Evaluation    [place] Name 12/28/18 [id number] 01/23/19 1353   Current issues with  None Identified  None Identified   Comments  no psychosocial needs identified, no interventions necessary.   no psychosocial needs identified, no interventions necessary.    Expected Outcomes  pt will exhibit positive outlook with good coping skills.   pt will exhibit positive outlook with good coping skills.    Interventions  Encouraged to attend Cardiac Rehabilitation for the exercise  Encouraged to attend Cardiac Rehabilitation for the exercise   Continue Psychosocial Services   No Follow up required  No Follow up required          Psychosocial Discharge (Final Psychosocial Re-Evaluation): Psychosocial Re-Evaluation - 01/23/19 1353     Psychosocial Re-Evaluation          Current issues with  None Identified    Comments  no psychosocial needs identified, no interventions necessary.     Expected Outcomes  pt will exhibit positive outlook with good coping skills.     Interventions  Encouraged to attend Cardiac Rehabilitation for the exercise    Continue Psychosocial Services   No Follow up required           Vocational Rehabilitation: Provide vocational rehab assistance to qualifying candidates.   Vocational Rehab Evaluation & Intervention: Vocational Rehab - 11/21/18 1515    Initial Vocational Rehab Evaluation & Intervention          Assessment shows need for Vocational Rehabilitation  No           Education: Education Goals: Education classes will be provided on a weekly basis, covering required topics. Participant will state understanding/return demonstration of topics presented.  Learning Barriers/Preferences:  Learning Barriers/Preferences - 11/21/18 1509    Learning Barriers/Preferences          Learning Barriers  Sight   glasses   Learning Preferences  Pictoral;Written Material           Education Topics: Count Your Pulse:  -Group instruction provided by verbal instruction, demonstration, patient participation and written materials to support subject.  Instructors address importance of being able to find your pulse and how to count your pulse when at home without a heart monitor.  Patients get hands on experience counting their pulse with staff help and individually.   Heart Attack, Angina, and Risk Factor Modification:  -Group instruction provided by verbal instruction, video, and written materials to support subject.  Instructors address signs and symptoms of angina and heart attacks.    Also discuss risk factors for heart disease and how to make changes to improve heart health risk factors.   Functional Fitness:  -Group instruction provided by verbal instruction, demonstration, patient  participation, and written materials to support subject.  Instructors address safety measures for doing things around the house.  Discuss how to get up and down off the floor, how to pick things up properly, how to safely get out of a chair without assistance, and balance training.   Meditation and Mindfulness:  -Group instruction provided by verbal instruction, patient participation, and written materials to support subject.  Instructor addresses importance of mindfulness and meditation practice to help reduce stress and improve awareness.  Instructor also leads participants through a meditation exercise.    Stretching for Flexibility and Mobility:  -Group instruction provided by verbal instruction, patient participation, and written materials to support subject.  Instructors lead participants through series of stretches that are designed to increase flexibility thus improving mobility.  These stretches are additional exercise for major muscle groups that are typically performed during regular warm up and cool down.   Hands Only CPR:  -Group verbal, video, and participation provides a basic overview of AHA guidelines for community CPR. Role-play of emergencies allow participants the opportunity to practice calling for help and chest compression technique with discussion of AED use.   Hypertension: -Group verbal and written instruction that provides a basic overview of hypertension including the most recent diagnostic guidelines, risk factor reduction with self-care instructions and medication management.    Nutrition I class: Heart Healthy Eating:  -Group instruction provided by PowerPoint slides, verbal discussion, and written materials to support subject matter. The instructor gives an explanation and review of the Therapeutic Lifestyle Changes diet recommendations, which includes a discussion on lipid goals, dietary fat, sodium, fiber, plant stanol/sterol esters, sugar, and the components of  a well-balanced, healthy diet.   Nutrition II class: Lifestyle Skills:  -Group instruction provided by PowerPoint slides, verbal discussion, and written materials to support subject matter. The instructor gives an explanation and review of label reading, grocery shopping for heart health, heart healthy recipe modifications, and ways to make healthier choices when eating out.   Diabetes Question & Answer:  -Group instruction provided by PowerPoint slides, verbal discussion, and written materials to support subject matter. The instructor gives an explanation and review of diabetes co-morbidities, pre- and post-prandial blood glucose goals, pre-exercise blood glucose goals, signs, symptoms, and treatment of hypoglycemia and hyperglycemia, and foot care basics.   Diabetes Blitz:  -Group instruction provided by PowerPoint slides, verbal discussion, and written materials to support subject matter. The instructor gives an explanation and review of the physiology behind type 1 and type  2 diabetes, diabetes medications and rational behind using different medications, pre- and post-prandial blood glucose recommendations and Hemoglobin A1c goals, diabetes diet, and exercise including blood glucose guidelines for exercising safely.    Portion Distortion:  -Group instruction provided by PowerPoint slides, verbal discussion, written materials, and food models to support subject matter. The instructor gives an explanation of serving size versus portion size, changes in portions sizes over the last 20 years, and what consists of a serving from each food group.   Stress Management:  -Group instruction provided by verbal instruction, video, and written materials to support subject matter.  Instructors review role of stress in heart disease and how to cope with stress positively.     Exercising on Your Own:  -Group instruction provided by verbal instruction, power point, and written materials to support  subject.  Instructors discuss benefits of exercise, components of exercise, frequency and intensity of exercise, and end points for exercise.  Also discuss use of nitroglycerin and activating EMS.  Review options of places to exercise outside of rehab.  Review guidelines for sex with heart disease. Flowsheet Row CARDIAC REHAB PHASE II EXERCISE from 01/17/2019 in [place]  Date  01/17/19  Instruction Review Code  2- Demonstrated Understanding      Cardiac Drugs I:  -Group instruction provided by verbal instruction and written materials to support subject.  Instructor reviews cardiac drug classes: antiplatelets, anticoagulants, beta blockers, and statins.  Instructor discusses reasons, side effects, and lifestyle considerations for each drug class.   Cardiac Drugs II:  -Group instruction provided by verbal instruction and written materials to support subject.  Instructor reviews cardiac drug classes: angiotensin converting enzyme inhibitors (ACE-I), angiotensin II receptor blockers (ARBs), nitrates, and calcium channel blockers.  Instructor discusses reasons, side effects, and lifestyle considerations for each drug class. Flowsheet Row CARDIAC REHAB PHASE II EXERCISE from 01/17/2019 in [place]  Date  01/03/19  Instruction Review Code  2- Demonstrated Understanding      Anatomy and Physiology of the Circulatory System:  Group verbal and written instruction and models provide basic cardiac anatomy and physiology, with the coronary electrical and arterial systems. Review of: AMI, Angina, Valve disease, Heart Failure, Peripheral Artery Disease, Cardiac Arrhythmia, Pacemakers, and the ICD. Flowsheet Row CARDIAC REHAB PHASE II EXERCISE from 01/17/2019 in [place]  Date  12/27/18  Instruction Review Code  2- Demonstrated Understanding      Other Education:  -Group or individual verbal, written, or  video instructions that support the educational goals of the cardiac rehab program.   Holiday Eating Survival Tips:  -Group instruction provided by PowerPoint slides, verbal discussion, and written materials to support subject matter. The instructor gives patients tips, tricks, and techniques to help them not only survive but enjoy the holidays despite the onslaught of food that accompanies the holidays.   Knowledge Questionnaire Score: Knowledge Questionnaire Score - 11/21/18 1509    Knowledge Questionnaire Score          Pre Score  19/24           Core Components/Risk Factors/Patient Goals at Admission: Personal Goals and Risk Factors at Admission - 11/21/18 1515    Core Components/Risk Factors/Patient Goals on Admission           Weight Management  Weight Gain;Yes    Intervention  Weight Management: Develop a combined nutrition and exercise program designed to reach desired caloric intake, while maintaining appropriate  intake of nutrient and fiber, sodium and fats, and appropriate energy expenditure required for the weight goal.;Weight Management: Provide education and appropriate resources to help participant work on and attain dietary goals.    Expected Outcomes  Weight Gain: Understanding of general recommendations for a high calorie, high protein meal plan that promotes weight gain by distributing calorie intake throughout the day with the consumption for 4-5 meals, snacks, and/or supplements;Weight Maintenance: Understanding of the daily nutrition guidelines, which includes 25-35% calories from fat, 7% or less cal from saturated fats, less than 211m cholesterol, less than 1.5gm of sodium, & 5 or more servings of fruits and vegetables daily;Understanding recommendations for meals to include 15-35% energy as protein, 25-35% energy from fat, 35-60% energy from carbohydrates, less than 2041mof dietary cholesterol, 20-35 gm of total fiber daily;Understanding of distribution of calorie  intake throughout the day with the consumption of 4-5 meals/snacks    Hypertension  Yes    Intervention  Provide education on lifestyle modifcations including regular physical activity/exercise, weight management, moderate sodium restriction and increased consumption of fresh fruit, vegetables, and low fat dairy, alcohol moderation, and smoking cessation.;Monitor prescription use compliance.    Expected Outcomes  Short Term: Continued assessment and intervention until BP is < 140/9013mG in hypertensive participants. < 130/51m40m in hypertensive participants with diabetes, heart failure or chronic kidney disease.;Long Term: Maintenance of blood pressure at goal levels.    Lipids  Yes    Intervention  Provide education and support for participant on nutrition & aerobic/resistive exercise along with prescribed medications to achieve LDL <70mg31mL >40mg.7mExpected Outcomes  Short Term: Participant states understanding of desired cholesterol values and is compliant with medications prescribed. Participant is following exercise prescription and nutrition guidelines.;Long Term: Cholesterol controlled with medications as prescribed, with individualized exercise RX and with personalized nutrition plan. Value goals: LDL < 70mg, 22m> 40 mg.    Stress  Yes    Intervention  Offer individual and/or small group education and counseling on adjustment to heart disease, stress management and health-related lifestyle change. Teach and support self-help strategies.;Refer participants experiencing significant psychosocial distress to appropriate mental health specialists for further evaluation and treatment. When possible, include family members and significant others in education/counseling sessions.    Expected Outcomes  Short Term: Participant demonstrates changes in health-related behavior, relaxation and other stress management skills, ability to obtain effective social support, and compliance with psychotropic  medications if prescribed.;Long Term: Emotional wellbeing is indicated by absence of clinically significant psychosocial distress or social isolation.           Core Components/Risk Factors/Patient Goals Review:  Goals and Risk Factor Review    Core Components/Risk Factors/Patient Goals Review    Row Name 11/27/18 1425 12/28/18 0836 01/23/19 1353   Personal Goals Review  Weight Management/Obesity;Hypertension;Lipids;Stress  Weight Management/Obesity;Hypertension;Lipids;Stress  Weight Management/Obesity;Hypertension;Lipids;Stress   Review  pt with multiple CAD RF demonstrates willingness to participate in CR program. pt eager to increase strength/stamina and decrease abdominal fat.    pt with multiple CAD RF demonstrates willingness to participate in CR program. pt eager to increase strength/stamina and decrease abdominal fat.  pt has continued DOE wtih more than usual activity.  pt is doing some stretching and walking at home.  pt lives alone.   pt with multiple CAD RF demonstrates willingness to participate in CR program. pt eager to increase strength/stamina and decrease abdominal fat.  pt has continued DOE wtih more than usual activity.  pt  is doing some stretching and walking at home.  pt lives alone. pt has added 30 minute HEP to home routine. 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     Expected Outcomes  pt will participate in CR exercise, nutrition and lifstyle modification to decrease overall RF.   pt will participate in CR exercise, nutrition and lifstyle modification to decrease overall RF.   pt will participate in CR exercise, nutrition and lifstyle modification to decrease overall RF.           Core Components/Risk Factors/Patient Goals at Discharge (Final Review):  Goals and Risk Factor Review - 01/23/19 1353    Core Components/Risk Factors/Patient Goals Review          Personal Goals Review  Weight Management/Obesity;Hypertension;Lipids;Stress    Review  pt with multiple CAD RF  demonstrates willingness to participate in CR program. pt eager to increase strength/stamina and decrease abdominal fat.  pt has continued DOE wtih more than usual activity.  pt is doing some stretching and walking at home.  pt lives alone. pt has added 30 minute HEP to home routine. 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      Expected Outcomes  pt will participate in CR exercise, nutrition and lifstyle modification to decrease overall RF.            ITP Comments: ITP Comments    Row Name 11/21/18 1501 11/27/18 1423 12/28/18 0832 01/23/19 1353   ITP Comments  Dr Fransico Him, Medical Director  pt started group exercise program. pt tolerated light activity without difficulty. pt oriented to exercise equipment and safety routine. understanding verbalized.   30 day ITP review. pt with good attendance and participation.  pt demonstrates increasing willingness to participate in group exercise setting.    30 day ITP review. pt with good attendance and participation.  pt demonstrates increasing willingness to participate in group exercise setting.        Comments:

## 2019-01-26 ENCOUNTER — Inpatient Hospital Stay (HOSPITAL_BASED_OUTPATIENT_CLINIC_OR_DEPARTMENT_OTHER): Payer: Medicare Other | Admitting: Hematology

## 2019-01-26 ENCOUNTER — Inpatient Hospital Stay: Payer: Medicare Other | Attending: Hematology

## 2019-01-26 ENCOUNTER — Telehealth: Payer: Self-pay | Admitting: Hematology

## 2019-01-26 ENCOUNTER — Encounter (HOSPITAL_COMMUNITY): Payer: Medicare Other

## 2019-01-26 ENCOUNTER — Encounter: Payer: Self-pay | Admitting: Hematology

## 2019-01-26 VITALS — BP 139/102 | HR 103 | Temp 97.7°F | Resp 20 | Ht 64.0 in | Wt 126.8 lb

## 2019-01-26 DIAGNOSIS — N951 Menopausal and female climacteric states: Secondary | ICD-10-CM

## 2019-01-26 DIAGNOSIS — C50412 Malignant neoplasm of upper-outer quadrant of left female breast: Secondary | ICD-10-CM

## 2019-01-26 DIAGNOSIS — I119 Hypertensive heart disease without heart failure: Secondary | ICD-10-CM | POA: Diagnosis not present

## 2019-01-26 DIAGNOSIS — M545 Low back pain: Secondary | ICD-10-CM

## 2019-01-26 DIAGNOSIS — D539 Nutritional anemia, unspecified: Secondary | ICD-10-CM | POA: Diagnosis not present

## 2019-01-26 DIAGNOSIS — Z7901 Long term (current) use of anticoagulants: Secondary | ICD-10-CM | POA: Diagnosis not present

## 2019-01-26 DIAGNOSIS — D7589 Other specified diseases of blood and blood-forming organs: Secondary | ICD-10-CM

## 2019-01-26 DIAGNOSIS — I4891 Unspecified atrial fibrillation: Secondary | ICD-10-CM

## 2019-01-26 DIAGNOSIS — I2510 Atherosclerotic heart disease of native coronary artery without angina pectoris: Secondary | ICD-10-CM | POA: Diagnosis not present

## 2019-01-26 DIAGNOSIS — Z170 Estrogen receptor positive status [ER+]: Secondary | ICD-10-CM | POA: Diagnosis not present

## 2019-01-26 DIAGNOSIS — Z720 Tobacco use: Secondary | ICD-10-CM

## 2019-01-26 DIAGNOSIS — Z9181 History of falling: Secondary | ICD-10-CM

## 2019-01-26 DIAGNOSIS — M810 Age-related osteoporosis without current pathological fracture: Secondary | ICD-10-CM

## 2019-01-26 LAB — VITAMIN B12: Vitamin B-12: 178 pg/mL — ABNORMAL LOW (ref 180–914)

## 2019-01-26 LAB — CBC WITH DIFFERENTIAL/PLATELET
ABS IMMATURE GRANULOCYTES: 0.01 10*3/uL (ref 0.00–0.07)
Basophils Absolute: 0 10*3/uL (ref 0.0–0.1)
Basophils Relative: 1 %
Eosinophils Absolute: 0 10*3/uL (ref 0.0–0.5)
Eosinophils Relative: 1 %
HCT: 38.2 % (ref 36.0–46.0)
Hemoglobin: 12.6 g/dL (ref 12.0–15.0)
IMMATURE GRANULOCYTES: 0 %
Lymphocytes Relative: 20 %
Lymphs Abs: 0.7 10*3/uL (ref 0.7–4.0)
MCH: 35.7 pg — ABNORMAL HIGH (ref 26.0–34.0)
MCHC: 33 g/dL (ref 30.0–36.0)
MCV: 108.2 fL — ABNORMAL HIGH (ref 80.0–100.0)
Monocytes Absolute: 0.3 10*3/uL (ref 0.1–1.0)
Monocytes Relative: 9 %
NEUTROS PCT: 69 %
Neutro Abs: 2.4 10*3/uL (ref 1.7–7.7)
Platelets: 124 10*3/uL — ABNORMAL LOW (ref 150–400)
RBC: 3.53 MIL/uL — ABNORMAL LOW (ref 3.87–5.11)
RDW: 12.9 % (ref 11.5–15.5)
WBC: 3.5 10*3/uL — ABNORMAL LOW (ref 4.0–10.5)
nRBC: 0 % (ref 0.0–0.2)

## 2019-01-26 LAB — COMPREHENSIVE METABOLIC PANEL
ALT: 19 U/L (ref 0–44)
AST: 29 U/L (ref 15–41)
Albumin: 3.7 g/dL (ref 3.5–5.0)
Alkaline Phosphatase: 54 U/L (ref 38–126)
Anion gap: 9 (ref 5–15)
BUN: 19 mg/dL (ref 8–23)
CO2: 24 mmol/L (ref 22–32)
Calcium: 8.7 mg/dL — ABNORMAL LOW (ref 8.9–10.3)
Chloride: 108 mmol/L (ref 98–111)
Creatinine, Ser: 0.84 mg/dL (ref 0.44–1.00)
GFR calc Af Amer: >60 mL/min (ref >60)
GFR calc non Af Amer: >60 mL/min (ref >60)
Glucose, Bld: 91 mg/dL (ref 70–99)
POTASSIUM: 4.3 mmol/L (ref 3.5–5.1)
Sodium: 141 mmol/L (ref 135–145)
Total Bilirubin: 1 mg/dL (ref 0.3–1.2)
Total Protein: 7.2 g/dL (ref 6.5–8.1)

## 2019-01-26 NOTE — Telephone Encounter (Signed)
Gave avs and calendar ° °

## 2019-01-29 ENCOUNTER — Encounter (HOSPITAL_COMMUNITY): Payer: Medicare Other

## 2019-01-29 ENCOUNTER — Encounter (HOSPITAL_COMMUNITY)
Admission: RE | Admit: 2019-01-29 | Discharge: 2019-01-29 | Disposition: A | Discharge status: Home / Self Care | Payer: Medicare Other | Source: Ambulatory Visit | Attending: Interventional Cardiology | Admitting: Interventional Cardiology

## 2019-01-29 DIAGNOSIS — Z9889 Other specified postprocedural states: Secondary | ICD-10-CM

## 2019-01-29 DIAGNOSIS — Z951 Presence of aortocoronary bypass graft: Secondary | ICD-10-CM | POA: Diagnosis not present

## 2019-01-29 LAB — FOLATE RBC
Folate, Hemolysate: >620 ng/mL
Folate, RBC: >1713 ng/mL (ref >498)
HEMATOCRIT: 36.2 % (ref 34.0–46.6)

## 2019-01-30 ENCOUNTER — Other Ambulatory Visit: Payer: Self-pay | Admitting: Hematology

## 2019-01-30 DIAGNOSIS — E538 Deficiency of other specified B group vitamins: Secondary | ICD-10-CM

## 2019-01-30 LAB — METHYLMALONIC ACID, SERUM: Methylmalonic Acid, Quantitative: 257 nmol/L (ref 0–378)

## 2019-01-30 NOTE — Progress Notes (Signed)
Left message for pt to call back  °

## 2019-01-31 ENCOUNTER — Encounter (HOSPITAL_COMMUNITY): Payer: Medicare Other

## 2019-01-31 ENCOUNTER — Encounter (HOSPITAL_COMMUNITY)
Admission: RE | Admit: 2019-01-31 | Discharge: 2019-01-31 | Disposition: A | Discharge status: Home / Self Care | Payer: Medicare Other | Source: Ambulatory Visit | Attending: Interventional Cardiology | Admitting: Interventional Cardiology

## 2019-01-31 ENCOUNTER — Other Ambulatory Visit: Payer: Self-pay

## 2019-01-31 ENCOUNTER — Telehealth: Payer: Self-pay

## 2019-01-31 DIAGNOSIS — Z951 Presence of aortocoronary bypass graft: Secondary | ICD-10-CM

## 2019-01-31 DIAGNOSIS — Z9889 Other specified postprocedural states: Secondary | ICD-10-CM

## 2019-01-31 NOTE — Telephone Encounter (Signed)
Spoke with patient regarding lab results, Per Dr. Margit Banda, B12 is slightly low, recommend B12 1000 mcg daily, also calcium is low, instructed to take OTC calcium supplement 600 mg daily, patient verbalized an understanding.

## 2019-02-02 ENCOUNTER — Encounter (HOSPITAL_COMMUNITY): Payer: Medicare Other

## 2019-02-05 ENCOUNTER — Telehealth (HOSPITAL_COMMUNITY): Payer: Self-pay | Admitting: Family Medicine

## 2019-02-05 ENCOUNTER — Telehealth (HOSPITAL_COMMUNITY): Payer: Self-pay | Admitting: Cardiac Rehabilitation

## 2019-02-05 ENCOUNTER — Encounter (HOSPITAL_COMMUNITY): Payer: Medicare Other

## 2019-02-05 NOTE — Telephone Encounter (Signed)
Phone call to pt to notify of CR Phase II departmental closure for 2 weeks.  Pt verbalized understanding.  Andi Hence, RN, BSN Cardiac Pulmonary Rehab

## 2019-02-07 ENCOUNTER — Encounter (HOSPITAL_COMMUNITY): Payer: Medicare Other

## 2019-02-09 ENCOUNTER — Encounter (HOSPITAL_COMMUNITY): Payer: Medicare Other

## 2019-02-12 ENCOUNTER — Encounter (HOSPITAL_COMMUNITY): Payer: Medicare Other

## 2019-02-13 ENCOUNTER — Telehealth (HOSPITAL_COMMUNITY): Payer: Self-pay | Admitting: Cardiac Rehabilitation

## 2019-02-13 ENCOUNTER — Encounter (HOSPITAL_COMMUNITY): Payer: Self-pay | Admitting: Cardiac Rehabilitation

## 2019-02-13 DIAGNOSIS — Z951 Presence of aortocoronary bypass graft: Secondary | ICD-10-CM

## 2019-02-13 DIAGNOSIS — Z9889 Other specified postprocedural states: Secondary | ICD-10-CM

## 2019-02-13 NOTE — Telephone Encounter (Signed)
Phone call to pt to advise of outpatient cardiac rehab departmental closing additional 30 days for COVID 19 precautions.  Left message on answering machine.  Jedediah Noda, RN, BSN Cardiac Pulmonary Rehab 

## 2019-02-13 NOTE — Progress Notes (Signed)
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  acetaminophen (TYLENOL) 325 MG tablet, Take 2 tablets (650 mg total) by mouth every 6 (six) hours as needed for mild pain or fever., Disp: , Rfl:  .  allopurinol (ZYLOPRIM) 100 MG tablet, Take 200 mg by mouth every evening. , Disp: , Rfl:  .  aspirin EC 81 MG EC tablet, Take 1 tablet (81 mg total) by mouth daily., Disp: , Rfl:  .  furosemide (LASIX) 20 MG tablet, Take 20 mg by mouth daily as needed (for swelling/fluid retention.). , Disp: , Rfl:  .  LIALDA 1.2 G EC tablet, Take 4.8 g by mouth daily. , Disp: , Rfl:  .  metoprolol tartrate (LOPRESSOR) 100 MG tablet, Take 1 tablet (100 mg total) by mouth 2 (two) times daily., Disp: , Rfl:  .  potassium chloride SA (K-DUR,KLOR-CON) 20 MEQ tablet, Take 1 tablet (20 mEq total) by mouth daily., Disp: 90 tablet, Rfl: 3 .  Rivaroxaban (XARELTO) 15 MG TABS tablet, Take 1 tablet (15 mg total) by mouth daily with supper., Disp: 90 tablet, Rfl: 1 .  simvastatin (ZOCOR) 20 MG tablet, Take 20 mg by mouth every evening. , Disp: , Rfl:  .  tamoxifen (NOLVADEX) 20 MG tablet, Take 20 mg by mouth daily., Disp: , Rfl:   Past Medical History: Past Medical History:  Diagnosis Date  . Arrhythmia   . Atypical atrial flutter ([place]) 08/14/2018   Post-operative  . Breast cancer of upper-outer quadrant of left female  breast ([place]) 02/02/2016  . CAD (coronary artery disease) 06/20/2018   LHC 7/19: pLAD 65/90, oD1 90, mLCx 85, OM2 50, oRCA 30, EF 50-55 >> s/p CABG in 9/19 (L-LAD)  . Colon polyp 02/2012  . Dupuytren contracture    bilateral hands  . Family history of breast cancer   . Hyperlipidemia   . Hypertension   . Mitral regurgitation 11/07/2013   Echo 7/19: Mild LVH, EF 60-65, no RWMA, mod ot severe MR, massive LAE, PASP 33 // TEE 7/19:  Mild conc LVH, EF 60-65, no RWMA, severe MR with mild post leaflet prolapse, massive // s/p MV repair 07/2018  . On continuous oral anticoagulation 08/22/2015   Started on Xarelto 08/12/2015   . Osteopenia   . Persistent atrial fibrillation 08/22/2015   Started late August or early September 2016 // s/p Maze procedure 07/2018  . Radiation Therapy 04/21/16-05/19/16   left breast 47.72 Gy, boosted to 10 Gy  . 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 S/P Maze operation for atrial fibrillation 08/10/2018   Complete bilateral atrial lesion set using bipolar radiofrequency and cryothermy ablation with clipping of LA appendage  . 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 Ulcerative colitis ([place]   .  Wears glasses     Tobacco Use: Social History   Tobacco Use  Smoking Status Former Smoker  . Packs/day: 1.00  . Years: 20.00  . Pack years: 20.00  . Types: Cigarettes  . Last attempt to quit: 11/22/1992  . Years since quitting: 26.2  Smokeless Tobacco Never Used    Labs: Recent Chemical engineer    Labs for ITP Cardiac and Pulmonary Rehab Latest Ref Rng & Units 08/10/2018 08/10/2018 08/10/2018 08/10/2018 08/11/2018   Hemoglobin A1c 4.8 - 5.6 % - - - - -   PHART 7.350 - 7.450 7.305(L) 7.348(L) 7.314(L) - -   PCO2ART 32.0 - 48.0 mmHg 42.3 32.8 42.9 - -   HCO3 20.0 - 28.0 mmol/L 21.5 18.0(L) 21.8 - -   TCO2 22 - 32 mmol/L  23 19(L) _0 ACIDBASEDEF 0.0 - 2.0 mmol/L 5.0(H) 7.0(H) 4.0(H) - -   O2SAT % 98.0 94.0 98.0 - -      Capillary Blood Glucose: Lab Results  Component Value Date   GLUCAP 88 08/14/2018   GLUCAP 94 08/13/2018   GLUCAP 87 08/13/2018   GLUCAP 101 (H) 08/13/2018   GLUCAP 103 (H) 08/13/2018     Exercise Target Goals: Exercise Program Goal: Individual exercise prescription set using results from initial 6 min walk test and THRR while considering  patient's activity barriers and safety.   Exercise Prescription Goal: Initial exercise prescription builds to 30-45 minutes a day of aerobic activity, 2-3 days per week.  Home exercise guidelines will be given to patient during program as part of exercise prescription that the participant will acknowledge.  Activity Barriers & Risk Stratification: Activity Barriers & Cardiac Risk Stratification - 11/21/18 1351    Activity Barriers & Cardiac Risk Stratification          Activity Barriers  Back Problems    Cardiac Risk Stratification  High           6 Minute Walk: 6 Minute Walk    6 Minute Walk    Row Name 11/21/18 1400   Phase  Initial   Distance  1303 feet   Walk Time  6 minutes   # of Rest Breaks  0   MPH  2.47   METS  2.69   RPE  9   Perceived Dyspnea   0   VO2 Peak  9.43   Symptoms  No   Resting HR  95 bpm   Resting BP  104/62   Resting Oxygen Saturation   95 %   Exercise Oxygen Saturation  during 6 min walk  93 %   Max Ex. HR  119 bpm   Max Ex. BP  118/80   2 Minute Post BP  104/62          Oxygen Initial Assessment:   Oxygen Re-Evaluation:   Oxygen Discharge (Final Oxygen Re-Evaluation):   Initial Exercise Prescription: Initial Exercise Prescription - 11/21/18 1500    Date of Initial Exercise RX and Referring Provider          Date  11/21/18    Referring Provider  [name]., MD    Expected Discharge Date  02/26/19        Recumbant Bike          Level  2    Watts  15    Minutes  10     METs  2.8        NuStep  Level  2    SPM  85    Minutes  10    METs  2.5        Track          Laps  9    Minutes  10    METs  2.57        Prescription Details          Frequency (times per week)  2    Duration  Progress to 30 minutes of continuous aerobic without signs/symptoms of physical distress        Intensity          THRR 40-80% of Max Heartrate  56-113    Ratings of Perceived Exertion  11-13    Perceived Dyspnea  0-4        Progression          Progression  Continue to progress workloads to maintain intensity without signs/symptoms of physical distress.        Resistance Training          Training Prescription  Yes    Weight  2lbs    Reps  10-15           Perform Capillary Blood Glucose checks as needed.  Exercise Prescription Changes: Exercise Prescription Changes    Response to Exercise    Row Name 11/27/18 1329 12/25/18 1500 01/16/19 1400 01/31/19 0859   Blood Pressure (Admit)  120/80  120/70  120/80  120/68   Blood Pressure (Exercise)  132/80  120/78  130/70  140/80   Blood Pressure (Exit)  120/80  108/70  106/68  106/62   Heart Rate (Admit)  97 bpm  92 bpm  82 bpm  99 bpm   Heart Rate (Exercise)  104 bpm  104 bpm  115 bpm  114 bpm   Heart Rate (Exit)  100 bpm  91 bpm  95 bpm  101 bpm   Rating of Perceived Exertion (Exercise)  _0 Symptoms  none  none  none  none   Duration  Progress to 30 minutes of  aerobic without signs/symptoms of physical distress  Progress to 30 minutes of  aerobic without signs/symptoms of physical distress  Progress to 30 minutes of  aerobic without signs/symptoms of physical distress  Progress to 30 minutes of  aerobic without signs/symptoms of physical distress   Intensity  THRR unchanged  THRR unchanged  THRR unchanged  THRR unchanged       Progression    Row Name 11/27/18 1329 12/25/18 1500 01/16/19 1400 01/31/19 0859   Progression  Continue to progress workloads to maintain intensity  without signs/symptoms of physical distress.  Continue to progress workloads to maintain intensity without signs/symptoms of physical distress.  Continue to progress workloads to maintain intensity without signs/symptoms of physical distress.  Continue to progress workloads to maintain intensity without signs/symptoms of physical distress.   Average METs  1.9  2.3  2.5  2.4       Resistance Training    Row Name 11/27/18 1329 12/25/18 1500 01/16/19 1400 01/31/19 0859   Training Prescription  Yes  Yes  Yes  No   Weight  2lbs  2lbs  3 lbs.   no documentation   Reps  10-15  10-15  10-15  no documentation   Time  10 Minutes  10 Minutes  10 Minutes  no documentation       Interval Training  Row Name 11/27/18 1329 12/25/18 1500 01/16/19 1400 01/31/19 0859   Interval Training  No  No  No  No       Recumbant Bike    Row Name 11/27/18 1329 12/25/18 1500 01/16/19 1400 01/31/19 0859   Level  _0 Watts  _1 Minutes  _2 METs  2  2.3  2.6  2.5       NuStep    Row Name 11/27/18 1329 12/25/18 1500 01/16/19 1400 01/31/19 0859   Level  _3 SPM  85  85  85  85   Minutes  _4 METs  1.8  1.8  2.1  2.1       Track    Row Name 11/27/18 1329 12/25/18 1500 01/16/19 1400 01/31/19 0859   Laps  no documentation  _5 Minutes  no documentation  _6 METs  no documentation  2.74  3.76  3.76          Exercise Comments: Exercise Comments    Row Name 11/27/18 1424 12/28/18 1041 01/23/19 1119 02/09/19 0900   Exercise Comments  Patient tolerated 1st session of exercise well without c/o.  Reviewed METs and goals with Pt. Tolerating exercise well.   Reviewed METs and goals with Pt. Tolerating exercise well.   Unable to review METs and goals with Pt due to closure of Cardiac Rehab for [place].       Exercise Goals and Review: Exercise Goals    Exercise Goals    Row Name 11/21/18 1352   Increase Physical Activity  Yes    Intervention  Provide advice, education, support and counseling about physical activity/exercise needs.;Develop an individualized exercise prescription for aerobic and resistive training based on initial evaluation findings, risk stratification, comorbidities and participant's personal goals.   Expected Outcomes  Short Term: Attend rehab on a regular basis to increase amount of physical activity.;Long Term: Add in home exercise to make exercise part of routine and to increase amount of physical activity.;Long Term: Exercising regularly at least 3-5 days a week.   Increase Strength and Stamina  Yes   Intervention  Provide advice, education, support and counseling about physical activity/exercise needs.;Develop an individualized exercise prescription for aerobic and resistive training based on initial evaluation findings, risk stratification, comorbidities and participant's personal goals.   Expected Outcomes  Short Term: Increase workloads from initial exercise prescription for resistance, speed, and METs.;Short Term: Perform resistance training exercises routinely during rehab and add in resistance training at home;Long Term: Improve cardiorespiratory fitness, muscular endurance and strength as measured by increased METs and functional capacity (6MWT)   Able to understand and use rate of perceived exertion (RPE) scale  Yes   Intervention  Provide education and explanation on how to use RPE scale   Expected Outcomes  Short Term: Able to use RPE daily in rehab to express subjective intensity level;Long Term:  Able to use RPE to guide intensity level when exercising independently   Knowledge and understanding of Target Heart Rate Range (THRR)  Yes   Intervention  Provide education and explanation of THRR including how the numbers were predicted and where they are located for reference   Expected Outcomes  Short Term: Able to state/look up  THRR;Long Term: Able to use THRR to govern intensity when exercising  independently;Short Term: Able to use daily as guideline for intensity in rehab   Understanding of Exercise Prescription  Yes   Intervention  Provide education, explanation, and written materials on patient's individual exercise prescription   Expected Outcomes  Short Term: Able to explain program exercise prescription;Long Term: Able to explain home exercise prescription to exercise independently          Exercise Goals Re-Evaluation : Exercise Goals Re-Evaluation    Exercise Goal Re-Evaluation    Row Name 11/27/18 1424 12/28/18 1039 01/23/19 1118 02/09/19 0900   Exercise Goals Review  Increase Physical Activity;Able to understand and use rate of perceived exertion (RPE) scale  Increase Physical Activity;Increase Strength and Stamina;Understanding of Exercise Prescription;Knowledge and understanding of Target Heart Rate Range (THRR);Able to understand and use rate of perceived exertion (RPE) scale;Able to check pulse independently  Increase Physical Activity;Increase Strength and Stamina;Understanding of Exercise Prescription;Knowledge and understanding of Target Heart Rate Range (THRR);Able to understand and use rate of perceived exertion (RPE) scale;Able to check pulse independently  Increase Physical Activity;Increase Strength and Stamina;Understanding of Exercise Prescription;Knowledge and understanding of Target Heart Rate Range (THRR);Able to understand and use rate of perceived exertion (RPE) scale;Able to check pulse independently   Comments  Patient able to understand and use RPE scale appropriately.  Reviewed METs and goals with Pt. Pt MET level is a 2.3 and is increasing gradually. Pt is not currently exercising at home. Encouraged Pt to walk 2-3 days per week for 30 minutes in addition to the program.   Reviewed METs and goals with Pt. Pt MET level is a 2.5 and is increasing gradually. Pt is walking 2-3 days per week for 30 minutes in addition to the program.   Unable to review METs and  goals with Pt due to closure of Cardiac Rehab for [place]. Pt MET level is 2.4. Pt is progressing well.    Expected Outcomes  Increase workloads as tolerated to help improve cardiorespiratory fitness.  Will continue to monitor and progress Pt as tolerated.   Will continue to monitor and progress Pt as tolerated.   Will continue to monitor and progress Pt as tolerated.           Discharge Exercise Prescription (Final Exercise Prescription Changes): Exercise Prescription Changes - 01/31/19 0859    Response to Exercise          Blood Pressure (Admit)  120/68    Blood Pressure (Exercise)  140/80    Blood Pressure (Exit)  106/62    Heart Rate (Admit)  99 bpm    Heart Rate (Exercise)  114 bpm    Heart Rate (Exit)  101 bpm    Rating of Perceived Exertion (Exercise)  11    Symptoms  none    Duration  Progress to 30 minutes of  aerobic without signs/symptoms of physical distress    Intensity  THRR unchanged        Progression          Progression  Continue to progress workloads to maintain intensity without signs/symptoms of physical distress.    Average METs  2.4        Resistance Training          Training Prescription  No        Interval Training          Interval Training  No        Recumbant Bike  Level  2    Watts  15    Minutes  10    METs  2.5        NuStep          Level  2    SPM  85    Minutes  20    METs  2.1        Track          Laps  16    Minutes  10    METs  3.76           Nutrition:  Target Goals: Understanding of nutrition guidelines, daily intake of sodium <1561m, cholesterol <2032m calories 30% from fat and 7% or less from saturated fats, daily to have 5 or more servings of fruits and vegetables.  Biometrics: Pre Biometrics - 11/21/18 1331    Pre Biometrics          Height  _0  (1.6 m)    Weight  123 lb 10.9 oz (56.1 kg)    Waist Circumference  31.75 inches    Hip Circumference  36.5 inches    Waist to Hip Ratio  0.87  %    BMI (Calculated)  21.91    Triceps Skinfold  11 mm    % Body Fat  31 %    Grip Strength  24.5 kg    Flexibility  --   not performed: back issues    Single Leg Stand  4.28 seconds            Nutrition Therapy Plan and Nutrition Goals: Nutrition Therapy & Goals - 11/24/18 1158    Nutrition Therapy          Diet  heart healthy        Personal Nutrition Goals          Nutrition Goal  Pt to eat minimum of 3 meals daily    Personal Goal #2  pt to continue to follow heart healthy diet        Intervention Plan          Intervention  Prescribe, educate and counsel regarding individualized specific dietary modifications aiming towards targeted core components such as weight, hypertension, lipid management, diabetes, heart failure and other comorbidities.    Expected Outcomes  Long Term Goal: Adherence to prescribed nutrition plan.           Nutrition Assessments: Nutrition Assessments - 11/21/18 1347    MEDFICTS Scores          Pre Score  6           Nutrition Goals Re-Evaluation:   Nutrition Goals Re-Evaluation:   Nutrition Goals Discharge (Final Nutrition Goals Re-Evaluation):   Psychosocial: Target Goals: Acknowledge presence or absence of significant depression and/or stress, maximize coping skills, provide positive support system. Participant is able to verbalize types and ability to use techniques and skills needed for reducing stress and depression.  Initial Review & Psychosocial Screening: Initial Psych Review & Screening - 11/21/18 1502    Initial Review          Current issues with  Current Stress Concerns    Source of Stress Concerns  Family    Comments  Pat's husband passed away last year from brain [place] Yes   PaFraser Dinas her sister for support in town and a son who  lives 2 hours away       Barriers          Psychosocial barriers to participate in program  The patient should benefit  from training in stress management and relaxation.        Screening Interventions          Interventions  Encouraged to exercise    Expected Outcomes  Long Term Goal: Stressors or current issues are controlled or eliminated.           Quality of Life Scores: Quality of Life - 11/21/18 1513    Quality of Life          Select  Quality of Life        Quality of Life Scores          Health/Function Pre  28.5 %    Socioeconomic Pre  30 %    Psych/Spiritual Pre  30 %    Family Pre  30 %    GLOBAL Pre  29.4 %          Scores of 19 and below usually indicate a poorer quality of life in these areas.  A difference of  2-3 points is a clinically meaningful difference.  A difference of 2-3 points in the total score of the Quality of Life Index has been associated with significant improvement in overall quality of life, self-image, physical symptoms, and general health in studies assessing change in quality of life.  PHQ-9: Recent Review Flowsheet Data    Depression screen [facility] 2/9 11/27/2018 12/30/2016 06/24/2016   Decreased Interest 0 0 0   Down, Depressed, Hopeless 0 - 0   PHQ - 2 Score 0 0 0     Interpretation of Total Score  Total Score Depression Severity:  1-4 = Minimal depression, 5-9 = Mild depression, 10-14 = Moderate depression, 15-19 = Moderately severe depression, 20-27 = Severe depression   Psychosocial Evaluation and Intervention: Psychosocial Evaluation - 11/27/18 1424    Psychosocial Evaluation & Interventions          Interventions  Encouraged to exercise with the program and follow exercise prescription;Stress management education;Relaxation education    Comments  pt currently exhibits normal grief pattern.  otherwise no psychosocial needs identified, no interventions necessary. pt enjoys spending time with people and animals.     Expected Outcomes  pt will exhibit positive outlook with good coping skills.      Continue Psychosocial Services   No Follow up required            Psychosocial Re-Evaluation: Psychosocial Re-Evaluation    Psychosocial Re-Evaluation    [place] Name 12/28/18 [phone] 01/23/19 1353 02/07/19 1211   Current issues with  None Identified  None Identified  None Identified   Comments  no psychosocial needs identified, no interventions necessary.   no psychosocial needs identified, no interventions necessary.   no psychosocial needs identified, no interventions necessary.    Expected Outcomes  pt will exhibit positive outlook with good coping skills.   pt will exhibit positive outlook with good coping skills.   pt will exhibit positive outlook with good coping skills.    Interventions  Encouraged to attend Cardiac Rehabilitation for the exercise  Encouraged to attend Cardiac Rehabilitation for the exercise  Encouraged to attend Cardiac Rehabilitation for the exercise   Continue Psychosocial Services   No Follow up required  No Follow up required  No Follow up required  Psychosocial Discharge (Final Psychosocial Re-Evaluation): Psychosocial Re-Evaluation - 02/07/19 1211    Psychosocial Re-Evaluation          Current issues with  None Identified    Comments  no psychosocial needs identified, no interventions necessary.     Expected Outcomes  pt will exhibit positive outlook with good coping skills.     Interventions  Encouraged to attend Cardiac Rehabilitation for the exercise    Continue Psychosocial Services   No Follow up required           Vocational Rehabilitation: Provide vocational rehab assistance to qualifying candidates.   Vocational Rehab Evaluation & Intervention: Vocational Rehab - 11/21/18 1515    Initial Vocational Rehab Evaluation & Intervention          Assessment shows need for Vocational Rehabilitation  No           Education: Education Goals: Education classes will be provided on a weekly basis, covering required topics. Participant will state understanding/return demonstration of topics  presented.  Learning Barriers/Preferences: Learning Barriers/Preferences - 11/21/18 1509    Learning Barriers/Preferences          Learning Barriers  Sight   glasses   Learning Preferences  Pictoral;Written Material           Education Topics: Count Your Pulse:  -Group instruction provided by verbal instruction, demonstration, patient participation and written materials to support subject.  Instructors address importance of being able to find your pulse and how to count your pulse when at home without a heart monitor.  Patients get hands on experience counting their pulse with staff help and individually.   Heart Attack, Angina, and Risk Factor Modification:  -Group instruction provided by verbal instruction, video, and written materials to support subject.  Instructors address signs and symptoms of angina and heart attacks.    Also discuss risk factors for heart disease and how to make changes to improve heart health risk factors.   Functional Fitness:  -Group instruction provided by verbal instruction, demonstration, patient participation, and written materials to support subject.  Instructors address safety measures for doing things around the house.  Discuss how to get up and down off the floor, how to pick things up properly, how to safely get out of a chair without assistance, and balance training.   Meditation and Mindfulness:  -Group instruction provided by verbal instruction, patient participation, and written materials to support subject.  Instructor addresses importance of mindfulness and meditation practice to help reduce stress and improve awareness.  Instructor also leads participants through a meditation exercise.    Stretching for Flexibility and Mobility:  -Group instruction provided by verbal instruction, patient participation, and written materials to support subject.  Instructors lead participants through series of stretches that are designed to increase  flexibility thus improving mobility.  These stretches are additional exercise for major muscle groups that are typically performed during regular warm up and cool down.   Hands Only CPR:  -Group verbal, video, and participation provides a basic overview of AHA guidelines for community CPR. Role-play of emergencies allow participants the opportunity to practice calling for help and chest compression technique with discussion of AED use.   Hypertension: -Group verbal and written instruction that provides a basic overview of hypertension including the most recent diagnostic guidelines, risk factor reduction with self-care instructions and medication management.    Nutrition I class: Heart Healthy Eating:  -Group instruction provided by PowerPoint slides, verbal discussion, and written materials to support subject matter.  The instructor gives an explanation and review of the Therapeutic Lifestyle Changes diet recommendations, which includes a discussion on lipid goals, dietary fat, sodium, fiber, plant stanol/sterol esters, sugar, and the components of a well-balanced, healthy diet.   Nutrition II class: Lifestyle Skills:  -Group instruction provided by PowerPoint slides, verbal discussion, and written materials to support subject matter. The instructor gives an explanation and review of label reading, grocery shopping for heart health, heart healthy recipe modifications, and ways to make healthier choices when eating out.   Diabetes Question & Answer:  -Group instruction provided by PowerPoint slides, verbal discussion, and written materials to support subject matter. The instructor gives an explanation and review of diabetes co-morbidities, pre- and post-prandial blood glucose goals, pre-exercise blood glucose goals, signs, symptoms, and treatment of hypoglycemia and hyperglycemia, and foot care basics.   Diabetes Blitz:  -Group instruction provided by PowerPoint slides, verbal discussion, and  written materials to support subject matter. The instructor gives an explanation and review of the physiology behind type 1 and type 2 diabetes, diabetes medications and rational behind using different medications, pre- and post-prandial blood glucose recommendations and Hemoglobin A1c goals, diabetes diet, and exercise including blood glucose guidelines for exercising safely.    Portion Distortion:  -Group instruction provided by PowerPoint slides, verbal discussion, written materials, and food models to support subject matter. The instructor gives an explanation of serving size versus portion size, changes in portions sizes over the last 20 years, and what consists of a serving from each food group.   Stress Management:  -Group instruction provided by verbal instruction, video, and written materials to support subject matter.  Instructors review role of stress in heart disease and how to cope with stress positively.     Exercising on Your Own:  -Group instruction provided by verbal instruction, power point, and written materials to support subject.  Instructors discuss benefits of exercise, components of exercise, frequency and intensity of exercise, and end points for exercise.  Also discuss use of nitroglycerin and activating EMS.  Review options of places to exercise outside of rehab.  Review guidelines for sex with heart disease. Flowsheet Row CARDIAC REHAB PHASE II EXERCISE from 01/17/2019 in [place]  Date  01/17/19  Instruction Review Code  2- Demonstrated Understanding      Cardiac Drugs I:  -Group instruction provided by verbal instruction and written materials to support subject.  Instructor reviews cardiac drug classes: antiplatelets, anticoagulants, beta blockers, and statins.  Instructor discusses reasons, side effects, and lifestyle considerations for each drug class.   Cardiac Drugs II:  -Group instruction provided by verbal instruction and  written materials to support subject.  Instructor reviews cardiac drug classes: angiotensin converting enzyme inhibitors (ACE-I), angiotensin II receptor blockers (ARBs), nitrates, and calcium channel blockers.  Instructor discusses reasons, side effects, and lifestyle considerations for each drug class. Flowsheet Row CARDIAC REHAB PHASE II EXERCISE from 01/17/2019 in [place]  Date  01/03/19  Instruction Review Code  2- Demonstrated Understanding      Anatomy and Physiology of the Circulatory System:  Group verbal and written instruction and models provide basic cardiac anatomy and physiology, with the coronary electrical and arterial systems. Review of: AMI, Angina, Valve disease, Heart Failure, Peripheral Artery Disease, Cardiac Arrhythmia, Pacemakers, and the ICD. Flowsheet Row CARDIAC REHAB PHASE II EXERCISE from 01/17/2019 in [place]  Date  12/27/18  Instruction Review Code  2- Demonstrated Understanding  Other Education:  -Group or individual verbal, written, or video instructions that support the educational goals of the cardiac rehab program.   Holiday Eating Survival Tips:  -Group instruction provided by PowerPoint slides, verbal discussion, and written materials to support subject matter. The instructor gives patients tips, tricks, and techniques to help them not only survive but enjoy the holidays despite the onslaught of food that accompanies the holidays.   Knowledge Questionnaire Score: Knowledge Questionnaire Score - 11/21/18 1509    Knowledge Questionnaire Score          Pre Score  19/24           Core Components/Risk Factors/Patient Goals at Admission: Personal Goals and Risk Factors at Admission - 11/21/18 1515    Core Components/Risk Factors/Patient Goals on Admission           Weight Management  Weight Gain;Yes    Intervention  Weight Management: Develop a combined nutrition and exercise  program designed to reach desired caloric intake, while maintaining appropriate intake of nutrient and fiber, sodium and fats, and appropriate energy expenditure required for the weight goal.;Weight Management: Provide education and appropriate resources to help participant work on and attain dietary goals.    Expected Outcomes  Weight Gain: Understanding of general recommendations for a high calorie, high protein meal plan that promotes weight gain by distributing calorie intake throughout the day with the consumption for 4-5 meals, snacks, and/or supplements;Weight Maintenance: Understanding of the daily nutrition guidelines, which includes 25-35% calories from fat, 7% or less cal from saturated fats, less than 211m cholesterol, less than 1.5gm of sodium, & 5 or more servings of fruits and vegetables daily;Understanding recommendations for meals to include 15-35% energy as protein, 25-35% energy from fat, 35-60% energy from carbohydrates, less than 205mof dietary cholesterol, 20-35 gm of total fiber daily;Understanding of distribution of calorie intake throughout the day with the consumption of 4-5 meals/snacks    Hypertension  Yes    Intervention  Provide education on lifestyle modifcations including regular physical activity/exercise, weight management, moderate sodium restriction and increased consumption of fresh fruit, vegetables, and low fat dairy, alcohol moderation, and smoking cessation.;Monitor prescription use compliance.    Expected Outcomes  Short Term: Continued assessment and intervention until BP is < 140/9059mG in hypertensive participants. < 130/49m59m in hypertensive participants with diabetes, heart failure or chronic kidney disease.;Long Term: Maintenance of blood pressure at goal levels.    Lipids  Yes    Intervention  Provide education and support for participant on nutrition & aerobic/resistive exercise along with prescribed medications to achieve LDL <70mg28mL >40mg.89m Expected Outcomes  Short Term: Participant states understanding of desired cholesterol values and is compliant with medications prescribed. Participant is following exercise prescription and nutrition guidelines.;Long Term: Cholesterol controlled with medications as prescribed, with individualized exercise RX and with personalized nutrition plan. Value goals: LDL < 70mg, 49m> 40 mg.    Stress  Yes    Intervention  Offer individual and/or small group education and counseling on adjustment to heart disease, stress management and health-related lifestyle change. Teach and support self-help strategies.;Refer participants experiencing significant psychosocial distress to appropriate mental health specialists for further evaluation and treatment. When possible, include family members and significant others in education/counseling sessions.    Expected Outcomes  Short Term: Participant demonstrates changes in health-related behavior, relaxation and other stress management skills, ability to obtain effective social support, and compliance with psychotropic medications if prescribed.;Long Term: Emotional wellbeing is  indicated by absence of clinically significant psychosocial distress or social isolation.           Core Components/Risk Factors/Patient Goals Review:  Goals and Risk Factor Review    Core Components/Risk Factors/Patient Goals Review    Row Name 11/27/18 1425 12/28/18 0836 01/23/19 1353 02/07/19 1211   Personal Goals Review  Weight Management/Obesity;Hypertension;Lipids;Stress  Weight Management/Obesity;Hypertension;Lipids;Stress  Weight Management/Obesity;Hypertension;Lipids;Stress  Weight Management/Obesity;Hypertension;Lipids;Stress   Review  pt with multiple CAD RF demonstrates willingness to participate in CR program. pt eager to increase strength/stamina and decrease abdominal fat.    pt with multiple CAD RF demonstrates willingness to participate in CR program. pt eager to increase  strength/stamina and decrease abdominal fat.  pt has continued DOE wtih more than usual activity.  pt is doing some stretching and walking at home.  pt lives alone.   pt with multiple CAD RF demonstrates willingness to participate in CR program. pt eager to increase strength/stamina and decrease abdominal fat.  pt has continued DOE wtih more than usual activity.  pt is doing some stretching and walking at home.  pt lives alone. pt has added 30 minute HEP to home routine. 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    pt with multiple CAD RF demonstrates willingness to participate in CR program. pt eager to increase strength/stamina and decrease abdominal fat.  pt has continued DOE wtih more than usual activity.  pt is doing some stretching and walking at home.  pt lives alone. pt has added 30 minute HEP to home routine. 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   pt currently on hold for departmental closing for COVID 19 precautions.    Expected Outcomes  pt will participate in CR exercise, nutrition and lifstyle modification to decrease overall RF.   pt will participate in CR exercise, nutrition and lifstyle modification to decrease overall RF.   pt will participate in CR exercise, nutrition and lifstyle modification to decrease overall RF.   pt will participate in CR exercise, nutrition and lifstyle modification to decrease overall RF.           Core Components/Risk Factors/Patient Goals at Discharge (Final Review):  Goals and Risk Factor Review - 02/07/19 1211    Core Components/Risk Factors/Patient Goals Review          Personal Goals Review  Weight Management/Obesity;Hypertension;Lipids;Stress    Review  pt with multiple CAD RF demonstrates willingness to participate in CR program. pt eager to increase strength/stamina and decrease abdominal fat.  pt has continued DOE wtih more than usual activity.  pt is doing some stretching and walking at home.  pt lives alone. pt has added 30 minute HEP to home  routine. 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   pt currently on hold for departmental closing for COVID 19 precautions.     Expected Outcomes  pt will participate in CR exercise, nutrition and lifstyle modification to decrease overall RF.            ITP Comments: ITP Comments    Row Name 11/21/18 1501 11/27/18 1423 12/28/18 0832 01/23/19 1353 02/07/19 1211   ITP Comments  Dr Fransico Him, Medical Director  pt started group exercise program. pt tolerated light activity without difficulty. pt oriented to exercise equipment and safety routine. understanding verbalized.   30 day ITP review. pt with good attendance and participation.  pt demonstrates increasing willingness to participate in group exercise setting.    30 day ITP review. pt with good attendance and  participation.  pt demonstrates increasing willingness to participate in group exercise setting.    30 day ITP review. pt with good attendance and participation.  pt demonstrates increasing willingness to participate in group exercise setting.   pt currently on hold for departmental closing for COVID 19 precautions.       Comments:  Andi Hence, RN, BSN Cardiac Pulmonary Rehab

## 2019-02-13 NOTE — Telephone Encounter (Signed)
Phone call to pt to advise of outpatient cardiac rehab departmental closing additional 30 days for COVID 19 precautions.  Understanding verbalized. Andi Hence, RN, BSN Cardiac Pulmonary Rehab

## 2019-02-14 ENCOUNTER — Encounter (HOSPITAL_COMMUNITY): Payer: Medicare Other

## 2019-02-15 ENCOUNTER — Ambulatory Visit (HOSPITAL_COMMUNITY): Payer: Medicare Other | Admitting: Nurse Practitioner

## 2019-02-16 ENCOUNTER — Encounter (HOSPITAL_COMMUNITY): Payer: Medicare Other

## 2019-02-19 ENCOUNTER — Encounter (HOSPITAL_COMMUNITY): Payer: Medicare Other

## 2019-02-21 ENCOUNTER — Encounter (HOSPITAL_COMMUNITY): Payer: Medicare Other

## 2019-02-23 ENCOUNTER — Encounter (HOSPITAL_COMMUNITY): Payer: Medicare Other

## 2019-02-26 ENCOUNTER — Encounter (HOSPITAL_COMMUNITY): Payer: Medicare Other

## 2019-02-28 ENCOUNTER — Encounter (HOSPITAL_COMMUNITY): Payer: Medicare Other

## 2019-02-28 ENCOUNTER — Other Ambulatory Visit: Payer: Self-pay

## 2019-02-28 MED ORDER — METOPROLOL TARTRATE 100 MG PO TABS: 100.0000 mg | ORAL_TABLET | Freq: Two times a day (BID) | ORAL | 3 refills | Status: DC

## 2019-03-01 ENCOUNTER — Telehealth (HOSPITAL_COMMUNITY): Payer: Self-pay | Admitting: Cardiac Rehabilitation

## 2019-03-01 MED ORDER — METOPROLOL TARTRATE 100 MG PO TABS: 100.0000 mg | ORAL_TABLET | Freq: Two times a day (BID) | ORAL | 3 refills | Status: DC

## 2019-03-01 NOTE — Addendum Note (Signed)
Addended by: Derl Barrow on: 03/01/2019 02:44 PM   Modules accepted: Orders

## 2019-03-01 NOTE — Telephone Encounter (Signed)
[facility] pharmacy requesting correction on first Rx that was sent in, there was not enough tablets dispensed for pt to take BID. The correction was resent in the the pharmacy. Confirmation received.

## 2019-03-01 NOTE — Telephone Encounter (Signed)
Pt phone call to inform of continued Outpatient Cardiac Rehab departmental closure for COVID 19 precautions. Future opening date to be determined. Pt instructed to continue exercising on her own. Pt also advised to notify PCP or cardiology for any symptoms, questions or concerns.  Left message on answering machine.  Andi Hence, RN, BSN Cardiac Pulmonary Rehab

## 2019-03-09 ENCOUNTER — Ambulatory Visit
Admission: RE | Admit: 2019-03-09 | Discharge: 2019-03-09 | Disposition: A | Discharge status: Home / Self Care | Payer: Medicare Other | Source: Ambulatory Visit | Attending: Hematology | Admitting: Hematology

## 2019-03-09 ENCOUNTER — Other Ambulatory Visit: Payer: Self-pay

## 2019-03-09 DIAGNOSIS — Z170 Estrogen receptor positive status [ER+]: Principal | ICD-10-CM

## 2019-03-09 DIAGNOSIS — C50412 Malignant neoplasm of upper-outer quadrant of left female breast: Secondary | ICD-10-CM

## 2019-03-12 ENCOUNTER — Other Ambulatory Visit: Payer: Self-pay | Admitting: Nurse Practitioner

## 2019-03-12 DIAGNOSIS — C50412 Malignant neoplasm of upper-outer quadrant of left female breast: Secondary | ICD-10-CM

## 2019-03-12 DIAGNOSIS — Z170 Estrogen receptor positive status [ER+]: Principal | ICD-10-CM

## 2019-03-12 MED ORDER — TAMOXIFEN CITRATE 20 MG PO TABS: 20.0000 mg | ORAL_TABLET | Freq: Every day | ORAL | 3 refills | Status: DC

## 2019-03-19 ENCOUNTER — Ambulatory Visit (HOSPITAL_COMMUNITY): Payer: Medicare Other | Admitting: Physician Assistant

## 2019-03-23 ENCOUNTER — Telehealth: Payer: Self-pay | Admitting: Interventional Cardiology

## 2019-03-23 NOTE — Telephone Encounter (Signed)
F/U Message            Patient is returning [place] call, patient would like a call back.

## 2019-03-23 NOTE — Telephone Encounter (Signed)
lmom to call back to switch appointment from office to virtual appointment , need to get consent

## 2019-03-23 NOTE — Telephone Encounter (Signed)
Patient set up for MyChart?  No patient does not have computer or smart phone  Is patient using Smartphone/computer/tablet? No flip phone  Did audio/video work  Does patient need telephone visit?yes   Best phone number to use? [phone]  Special Instructions? Patient has scale but no bp cuff, will have medication ready      Virtual Visit Pre-Appointment Phone Call  "(Name), I am calling you today to discuss your upcoming appointment. We are currently trying to limit exposure to the virus that causes COVID-19 by seeing patients at home rather than in the office."  1. "What is the BEST phone number to call the day of the visit?" - include this in appointment notes  2. Do you have or have access to (through a family member/friend) a smartphone with video capability that we can use for your visit?" a. If yes - list this number in appt notes as cell (if different from BEST phone #) and list the appointment type as a VIDEO visit in appointment notes b. If no - list the appointment type as a PHONE visit in appointment notes  3. Confirm consent - "In the setting of the current Covid19 crisis, you are scheduled for a (phone or video) visit with your provider on (date) at (time).  Just as we do with many in-office visits, in order for you to participate in this visit, we must obtain consent.  If you'd like, I can send this to your mychart (if signed up) or email for you to review.  Otherwise, I can obtain your verbal consent now.  All virtual visits are billed to your insurance company just like a normal visit would be.  By agreeing to a virtual visit, we'd like you to understand that the technology does not allow for your provider to perform an examination, and thus may limit your provider's ability to fully assess your condition. If your provider identifies any concerns that need to be evaluated in person, we will make arrangements to do so.  Finally, though the technology is pretty good, we  cannot assure that it will always work on either your or our end, and in the setting of a video visit, we may have to convert it to a phone-only visit.  In either situation, we cannot ensure that we have a secure connection.  Are you willing to proceed?" STAFF: Did the patient verbally acknowledge consent to telehealth visit? Document YES/NO here: yes  4. Advise patient to be prepared - "Two hours prior to your appointment, go ahead and check your blood pressure, pulse, oxygen saturation, and your weight (if you have the equipment to check those) and write them all down. When your visit starts, your provider will ask you for this information. If you have an Apple Watch or Kardia device, please plan to have heart rate information ready on the day of your appointment. Please have a pen and paper handy nearby the day of the visit as well."  5. Give patient instructions for MyChart download to smartphone OR [email or web address] as below if video visit (depending on what platform provider is using)  6. Inform patient they will receive a phone call 15 minutes prior to their appointment time (may be from unknown caller ID) so they should be prepared to answer    TELEPHONE CALL NOTE  Michele Green has been deemed a candidate for a follow-up tele-health visit to limit community exposure during the Covid-19 pandemic. I spoke with the patient via phone to ensure  availability of phone/video source, confirm preferred email & phone number, and discuss instructions and expectations.  I reminded Michele Green to be prepared with any vital sign and/or heart rhythm information that could potentially be obtained via home monitoring, at the time of her visit. I reminded Michele Green to expect a phone call prior to her visit.  Howie Ill 03/23/2019 2:41 PM   INSTRUCTIONS FOR DOWNLOADING THE MYCHART APP TO SMARTPHONE  - The patient must first make sure to have activated MyChart and know their login  information - If Apple, go to [employer] and type in MyChart in the search bar and download the app. If Android, ask patient to go to [employer] and type in [facility] in the search bar and download the app. The app is free but as with any other app downloads, their phone may require them to verify saved payment information or Apple/Android password.  - The patient will need to then log into the app with their MyChart username and password, and select [facility] as their healthcare provider to link the account. When it is time for your visit, go to the MyChart app, find appointments, and click Begin Video Visit. Be sure to Select Allow for your device to access the Microphone and Camera for your visit. You will then be connected, and your provider will be with you shortly.  **If they have any issues connecting, or need assistance please contact MyChart service desk [phone])**  **If using a computer, in order to ensure the best quality for their visit they will need to use either of the following Internet Browsers: [employer], or Google Chrome**  IF USING DOXIMITY or DOXY.ME - The patient will receive a link just prior to their visit by text.     FULL LENGTH CONSENT FOR TELE-HEALTH VISIT   I hereby voluntarily request, consent and authorize [facility] and its employed or contracted physicians, physician assistants, nurse practitioners or other licensed health care professionals (the Practitioner), to provide me with telemedicine health care services (the Services") as deemed necessary by the treating Practitioner. I acknowledge and consent to receive the Services by the Practitioner via telemedicine. I understand that the telemedicine visit will involve communicating with the Practitioner through live audiovisual communication technology and the disclosure of certain medical information by electronic transmission. I acknowledge that I have been given the opportunity to  request an in-person assessment or other available alternative prior to the telemedicine visit and am voluntarily participating in the telemedicine visit.  I understand that I have the right to withhold or withdraw my consent to the use of telemedicine in the course of my care at any time, without affecting my right to future care or treatment, and that the Practitioner or I may terminate the telemedicine visit at any time. I understand that I have the right to inspect all information obtained and/or recorded in the course of the telemedicine visit and may receive copies of available information for a reasonable fee.  I understand that some of the potential risks of receiving the Services via telemedicine include:   Delay or interruption in medical evaluation due to technological equipment failure or disruption;  Information transmitted may not be sufficient (e.g. poor resolution of images) to allow for appropriate medical decision making by the Practitioner; and/or   In rare instances, security protocols could fail, causing a breach of personal health information.  Furthermore, I acknowledge that it is my responsibility to provide information  about my medical history, conditions and care that is complete and accurate to the best of my ability. I acknowledge that Practitioner's advice, recommendations, and/or decision may be based on factors not within their control, such as incomplete or inaccurate data provided by me or distortions of diagnostic images or specimens that may result from electronic transmissions. I understand that the practice of medicine is not an exact science and that Practitioner makes no warranties or guarantees regarding treatment outcomes. I acknowledge that I will receive a copy of this consent concurrently upon execution via email to the email address I last provided but may also request a printed copy by calling the office of [facility].    I understand that my insurance  will be billed for this visit.   I have read or had this consent read to me.  I understand the contents of this consent, which adequately explains the benefits and risks of the Services being provided via telemedicine.   I have been provided ample opportunity to ask questions regarding this consent and the Services and have had my questions answered to my satisfaction.  I give my informed consent for the services to be provided through the use of telemedicine in my medical care  By participating in this telemedicine visit I agree to the above.

## 2019-03-28 NOTE — Progress Notes (Signed)
Virtual Visit via Video Note   This visit type was conducted due to national recommendations for restrictions regarding the COVID-19 Pandemic (e.g. social distancing) in an effort to limit this patient's exposure and mitigate transmission in our community.  Due to her co-morbid illnesses, this patient is at least at moderate risk for complications without adequate follow up.  This format is felt to be most appropriate for this patient at this time.  All issues noted in this document were discussed and addressed.  A limited physical exam was performed with this format.  Please refer to the patient's chart for her consent to telehealth for [facility].   Date:  03/28/2019   ID:  Michele Green, DOB [birth year], MRN [id number]  Patient Location: Home Provider Location: Office  PCP:  [name], MD  Cardiologist:  Sinclair Grooms, MD  Electrophysiologist:  Constance Haw, MD   Evaluation Performed:  Follow-Up Visit  Chief Complaint: Coronary bypass grafting x1, and mitral valve repair, atrial fibrillation status post maze  History of Present Illness:    Michele Green is a 78 y.o. female with CAD s/p CABG is 23 July 2018, severe MR s/p repair September 2019, breast cancer, HTN, history of atrial fibrillation prior to open heart surgery, Maze procedure with out atrial appendage ligation September 2019, recurrent A. fib and a flutter post surgery despite amiodarone therapy and cardioversion November 2019.   He denies ongoing cardiac symptoms.  Her breathing has improved.  No orthopnea, PND, significant palpitations, lower extremity swelling, chest pain, or syncopal episodes.  She was having significant nasal bleeding and eventually Xarelto dose was decreased to 15 mg.  She has been continued on a baby aspirin per day.  No significant bleeding problems at this time.  She has had no falls, blood in the urine or stool, or other complaints.  There have been no neurological  complaints.  She is getting out in her yard and working from time to time.  Appetite is good.  The patient does not have symptoms concerning for COVID-19 infection (fever, chills, cough, or new shortness of breath).    Past Medical History:  Diagnosis Date  . Arrhythmia   . Atypical atrial flutter ([place]) 08/14/2018   Post-operative  . Breast cancer of upper-outer quadrant of left female breast ([place]) 02/02/2016  . CAD (coronary artery disease) 06/20/2018   LHC 7/19: pLAD 65/90, oD1 90, mLCx 85, OM2 50, oRCA 30, EF 50-55 >> s/p CABG in 9/19 (L-LAD)  . Colon polyp 02/2012  . Dupuytren contracture    bilateral hands  . Family history of breast cancer   . Hyperlipidemia   . Hypertension   . Mitral regurgitation 11/07/2013   Echo 7/19: Mild LVH, EF 60-65, no RWMA, mod ot severe MR, massive LAE, PASP 33 // TEE 7/19:  Mild conc LVH, EF 60-65, no RWMA, severe MR with mild post leaflet prolapse, massive // s/p MV repair 07/2018  . On continuous oral anticoagulation 08/22/2015   Started on Xarelto 08/12/2015   . Osteopenia   . Persistent atrial fibrillation 08/22/2015   Started late August or early September 2016 // s/p Maze procedure 07/2018  . Personal history of radiation therapy   . Radiation Therapy 04/21/16-05/19/16   left breast 47.72 Gy, boosted to 10 Gy  . 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 S/P Maze operation for atrial fibrillation 08/10/2018   Complete bilateral atrial lesion set using bipolar radiofrequency and  cryothermy ablation with clipping of LA appendage  . S/P MVR (mitral valve repair) 08/10/2018   Complex valvuloplasty including Gore-tex neochord placement x8, Plication of Lateral Commissure and 57m Sorin Memo 4D Ring Annuloplasty SN# [id number] . Ulcerative colitis ([place]   . Wears glasses    Past Surgical History:  Procedure Laterality Date  . BREAST BIOPSY Left 01/28/2016  . BREAST LUMPECTOMY Left 02/23/2016  . BREAST LUMPECTOMY WITH RADIOACTIVE SEED LOCALIZATION Left  02/23/2016   Procedure: BREAST LUMPECTOMY WITH RADIOACTIVE SEED LOCALIZATION;  Surgeon: BExcell Seltzer MD;  [place] [facility]  Service: General;  Laterality: Left;  . CARDIAC CATHETERIZATION    . CARDIOVERSION N/A 10/02/2015   Procedure: CARDIOVERSION;  Surgeon: MJerline Pain MD;  [facility];  Service: Cardiovascular;  Laterality: N/A;  . CARDIOVERSION N/A 10/05/2018   Procedure: CARDIOVERSION;  Surgeon: RFay Records MD;  [facility]  Service: Cardiovascular;  Laterality: N/A;  . CLIPPING OF ATRIAL APPENDAGE  08/10/2018   Procedure: CLIPPING OF ATRIAL APPENDAGE;  Surgeon: ORexene Alberts MD;  [place] [facility]  Service: Open Heart Surgery;;  . COLONOSCOPY    . CORONARY ARTERY BYPASS GRAFT N/A 08/10/2018   Procedure: CORONARY ARTERY BYPASS GRAFTING (CABG) x 1, LIMA-LAD,  USING LEFT INTERNAL MAMMARY ARTERY. HARVESTED RIGHT GREATER SAPHENOUS VEIN ENDOSCOPICALLY;  Surgeon: ORexene Alberts MD;  [place] [facility]  Service: Open Heart Surgery;  Laterality: N/A;  . DUPUYTREN CONTRACTURE RELEASE  2001   leftx2  . DMathews  right  . DUPUYTREN CONTRACTURE RELEASE Right 05/02/2014   Procedure: EXCISION DUPUYTRENS RIGHT PALMAR/SMALL ;  Surgeon: RCammie Sickle MD;  [place] [facility]  Service: Orthopedics;  Laterality: Right;  .Marland KitchenMAZE N/A 08/10/2018   Procedure: MAZE;  Surgeon: ORexene Alberts MD;  [place] [facility]  Service: Open Heart Surgery;  Laterality: N/A;  . MITRAL VALVE REPAIR N/A 08/10/2018   Procedure: MITRAL VALVE REPAIR (MVR);  Surgeon: ORexene Alberts MD;  [place] [facility]  Service: Open Heart Surgery;  Laterality: N/A;  glutaraldehyde  . RIGHT/LEFT HEART CATH AND CORONARY ANGIOGRAPHY N/A 06/20/2018   Procedure: RIGHT/LEFT HEART CATH AND CORONARY ANGIOGRAPHY;  Surgeon: SBelva Crome MD;  [facility];  Service: Cardiovascular;  Laterality: N/A;  . TEE WITHOUT CARDIOVERSION N/A  06/20/2018   Procedure: TRANSESOPHAGEAL ECHOCARDIOGRAM (TEE);  Surgeon: HPixie Casino MD;  [facility];  Service: Cardiovascular;  Laterality: N/A;  . TEE WITHOUT CARDIOVERSION N/A 08/10/2018   Procedure: TRANSESOPHAGEAL ECHOCARDIOGRAM (TEE);  Surgeon: ORexene Alberts MD;  [place] [facility] [place] [facility]  Service: Open Heart Surgery;  Laterality: N/A;  . TONSILLECTOMY  age 69     No outpatient medications have been marked as taking for the 03/29/19 encounter (Appointment) with SBelva Crome MD.     Allergies:   Shellfish allergy   Social History   Tobacco Use  . Smoking status: Former Smoker    Packs/day: 1.00    Years: 20.00    Pack years: 20.00    Types: Cigarettes    Last attempt to quit: 11/22/1992    Years since quitting: 26.3  . Smokeless tobacco: Never Used  Substance Use Topics  . Alcohol use: Yes    Alcohol/week: 3.0 standard drinks    Types: 3 Standard drinks or equivalent per week    Comment: social  . Drug use: No     Family Hx: The patient's family history includes  Breast cancer in her sister; Breast cancer (age of onset: 31) in her mother; Cirrhosis in her mother; Heart attack in her father; Heart failure in her father; Kidney Stones in her sister. There is no history of Osteoporosis.  ROS:   Please see the history of present illness.    Overall she is pleased that things are finally improving.  Appetite is stable. All other systems reviewed and are negative.   Prior CV studies:   The following studies were reviewed today:  No new imaging or functional data  Labs/Other Tests and Data Reviewed:    EKG:  No ECG reviewed.  Recent Labs: 05/30/2018: NT-Pro BNP 5,122; TSH 1.200 08/16/2018: Magnesium 1.9 01/26/2019: ALT 19; BUN 19; Creatinine, Ser 0.84; Hemoglobin 12.6; Platelets 124; Potassium 4.3; Sodium 141   Recent Lipid Panel No results found for: CHOL, TRIG, HDL, CHOLHDL, LDLCALC, LDLDIRECT  Wt Readings from Last 3 Encounters:  01/26/19 126 lb 12.8 oz  (57.5 kg)  12/29/18 125 lb 3.2 oz (56.8 kg)  12/18/18 124 lb 8 oz (56.5 kg)     Objective:    Vital Signs:  There were no vitals taken for this visit.   VITAL SIGNS:  reviewed  ASSESSMENT & PLAN:    1. S/P mitral valve repair + CABG x1 + maze procedure   2. S/P CABG x 1   3. Essential hypertension   4. Chronic diastolic HF (heart failure) ([place])   5. Long term (current) use of anticoagulants   6. Persistent atrial fibrillation   7. Hyperlipidemia, unspecified hyperlipidemia type   8. High risk medication use   9. 2019 novel coronavirus disease (COVID-19)    PLAN: 1. Patient will have cardiac auscultation when she returns in 3 months. 2. No chest pains suggest angina. 3. We discussed target blood pressure of 130/80. 4. No clinical symptoms to suggest diastolic heart failure/volume overload. 5. No bleeding on current anticoagulation regimen with aspirin 81 mg/day and Xarelto 15 mg daily 6. Not bothered by palpitations or excessive fatigue. 7. LDL target should be less than 70.  Plan face-to-face office visit in 3 months.  Overall education and awareness concerning primary/secondary risk prevention was discussed in detail: LDL less than 70, hemoglobin A1c less than 7, blood pressure target less than 130/80 mmHg, >150 minutes of moderate aerobic activity per week, avoidance of smoking, weight control (via diet and exercise), and continued surveillance/management of/for obstructive sleep apnea.    COVID-19 Education: The signs and symptoms of COVID-19 were discussed with the patient and how to seek care for testing (follow up with PCP or arrange E-visit).  The importance of social distancing was discussed today.  Time:   Today, I have spent 15 minutes with the patient with telehealth technology discussing the above problems.     Medication Adjustments/Labs and Tests Ordered: Current medicines are reviewed at length with the patient today.  Concerns regarding medicines are  outlined above.   Tests Ordered: No orders of the defined types were placed in this encounter.   Medication Changes: No orders of the defined types were placed in this encounter.   Disposition:  Follow up in 3 month(s)  Signed, Sinclair Grooms, MD  03/28/2019 9:07 PM    [place]

## 2019-03-29 ENCOUNTER — Telehealth (INDEPENDENT_AMBULATORY_CARE_PROVIDER_SITE_OTHER): Payer: Medicare Other | Admitting: Interventional Cardiology

## 2019-03-29 ENCOUNTER — Encounter: Payer: Self-pay | Admitting: Interventional Cardiology

## 2019-03-29 ENCOUNTER — Other Ambulatory Visit: Payer: Self-pay

## 2019-03-29 VITALS — Ht 64.0 in | Wt 129.0 lb

## 2019-03-29 DIAGNOSIS — Z9889 Other specified postprocedural states: Secondary | ICD-10-CM | POA: Diagnosis not present

## 2019-03-29 DIAGNOSIS — I5032 Chronic diastolic (congestive) heart failure: Secondary | ICD-10-CM

## 2019-03-29 DIAGNOSIS — E785 Hyperlipidemia, unspecified: Secondary | ICD-10-CM

## 2019-03-29 DIAGNOSIS — I4819 Other persistent atrial fibrillation: Secondary | ICD-10-CM

## 2019-03-29 DIAGNOSIS — Z7901 Long term (current) use of anticoagulants: Secondary | ICD-10-CM

## 2019-03-29 DIAGNOSIS — Z79899 Other long term (current) drug therapy: Secondary | ICD-10-CM

## 2019-03-29 DIAGNOSIS — U071 COVID-19: Secondary | ICD-10-CM

## 2019-03-29 DIAGNOSIS — I10 Essential (primary) hypertension: Secondary | ICD-10-CM

## 2019-03-29 DIAGNOSIS — Z951 Presence of aortocoronary bypass graft: Secondary | ICD-10-CM

## 2019-03-29 NOTE — Patient Instructions (Signed)
Medication Instructions:  Your physician recommends that you continue on your current medications as directed. Please refer to the Current Medication list given to you today.  If you need a refill on your cardiac medications before your next appointment, please call your pharmacy.   Lab work: None If you have labs (blood work) drawn today and your tests are completely normal, you will receive your results only by: [name] MyChart Message (if you have MyChart) OR . A paper copy in the mail If you have any lab test that is abnormal or we need to change your treatment, we will call you to review the results.  Testing/Procedures: None  Follow-Up: At [facility], you and your health needs are our priority.  As part of our continuing mission to provide you with exceptional heart care, we have created designated Provider Care Teams.  These Care Teams include your primary Cardiologist (physician) and Advanced Practice Providers (APPs -  Physician Assistants and Nurse Practitioners) who all work together to provide you with the care you need, when you need it. You will need a follow up appointment in 4 months.  Please call our office 2 months in advance to schedule this appointment.  You may see Sinclair Grooms, MD or one of the following Advanced Practice Providers on your designated Care Team:   Truitt Merle, NP Cecilie Kicks, NP . Kathyrn Drown, NP  Any Other Special Instructions Will Be Listed Below (If Applicable).

## 2019-04-17 ENCOUNTER — Telehealth (HOSPITAL_COMMUNITY): Payer: Self-pay

## 2019-04-17 NOTE — Telephone Encounter (Signed)
Phone call made to Pt to provide information about virtual cardiac rehab. Pt does not have smart device. Pt requested handouts be mailed to her and finish her sessions once cardiac rehab opens. Will mail handouts to Pt.

## 2019-05-09 ENCOUNTER — Telehealth: Payer: Self-pay

## 2019-05-09 NOTE — Telephone Encounter (Signed)
Patient calls wanting to know if it is okay for her GYN to give her an estrogen creme, consulted with Dr. Burr Medico, called the patient back and informed okay to use. Patient verbalized an understanding

## 2019-05-14 ENCOUNTER — Other Ambulatory Visit: Payer: Self-pay | Admitting: Obstetrics and Gynecology

## 2019-05-14 DIAGNOSIS — R109 Unspecified abdominal pain: Secondary | ICD-10-CM

## 2019-05-14 DIAGNOSIS — R3915 Urgency of urination: Secondary | ICD-10-CM

## 2019-05-14 DIAGNOSIS — R102 Pelvic and perineal pain: Secondary | ICD-10-CM

## 2019-05-15 ENCOUNTER — Telehealth: Payer: Self-pay | Admitting: Cardiology

## 2019-05-15 NOTE — Telephone Encounter (Signed)
Message from [facility]  Correspondence # [id number]  For amiodarone and dig, both of these meds were stopped 12/18/18 by Dr. Curt Bears,  I left a message with [facility]

## 2019-05-18 ENCOUNTER — Telehealth: Payer: Self-pay | Admitting: Interventional Cardiology

## 2019-05-18 NOTE — Telephone Encounter (Signed)
PharmD to review how long to hold Xarelto prior to surgery.

## 2019-05-18 NOTE — Telephone Encounter (Signed)
° °  [facility] Pre-operative Risk Assessment    Request for surgical clearance:  1. What type of surgery is being performed? DNC   2. When is this surgery scheduled? 05-24-19   3. What type of clearance is required (medical clearance vs. Pharmacy clearance to hold med vs. Both)? Medical Clearance and Pharmacy  4. Are there any medications that need to be held prior to surgery and how long? Can pt stop his Xarelto? If so for how many days before surgery   5. Practice name and name of physician performing surgery?  Dr Tacey Ruiz   6. What is your office phone number [phone]    7.   What is your office fax number [phone]  8.   Anesthesia type (None, local, MAC, general) ?  Propofol  Michele Green 05/18/2019, 11:32 AM  _________________________________________________________________   (provider comments below)

## 2019-05-18 NOTE — Telephone Encounter (Signed)
Patient with diagnosis of afib on xarelto for anticoagulation.    Procedure: [facility] Date of procedure: 05/24/2019  CHADS2-VASc score of  5 (CHF, HTN, AGE, DM2, stroke/tia x 2, CAD, AGE, female)  CrCl 49 ml/min  Per office protocol, patient can hold Xarelto for 1-2 days prior to procedure.

## 2019-05-19 ENCOUNTER — Other Ambulatory Visit: Payer: Self-pay | Admitting: Cardiology

## 2019-05-21 NOTE — Telephone Encounter (Signed)
Please refill.  Thank you

## 2019-05-21 NOTE — Telephone Encounter (Signed)
   Primary Cardiologist: Sinclair Grooms, MD  Chart reviewed as part of pre-operative protocol coverage. Patient was contacted 05/21/2019 in reference to pre-operative risk assessment for pending surgery as outlined below.  Michele Green was last seen on 03/29/19 by [name].  Since that day, Michele Green has done well. She can complete more than 4.0 METS without anginal symptoms. No changes in her cardiac history since her MVR and CABG in 07/2018. She will continue ASA without interruption.  Per our pharmacy staff: Patient with diagnosis of afib on xarelto for anticoagulation.    Procedure: [facility] Date of procedure: 05/24/2019  CHADS2-VASc score of  5 (CHF, HTN, AGE, DM2, stroke/tia x 2, CAD, AGE, female)  CrCl 49 ml/min  Per office protocol, patient can hold Xarelto for 1-2 days prior to procedure.   Therefore, based on ACC/AHA guidelines, the patient would be at acceptable risk for the planned procedure without further cardiovascular testing.   I will route this recommendation to the requesting party via Epic fax function and remove from pre-op pool.  Please call with questions.  Tami Lin Duke, PA 05/21/2019, 11:05 AM

## 2019-05-22 ENCOUNTER — Telehealth (HOSPITAL_COMMUNITY): Payer: Self-pay | Admitting: *Deleted

## 2019-05-22 NOTE — Addendum Note (Signed)
Encounter addended by: Noel Christmas, RN on: 05/22/2019 3:40 PM  Actions taken: Clinical Note Signed

## 2019-05-22 NOTE — Progress Notes (Signed)
Discharge Progress Report  Patient Details  Name: Michele Green MRN: [id number] Date of Birth: 04/03/1941 Referring Provider:     [facility] 11/21/2018 in [place]  Referring Provider  [name]., MD       Number of Visits: 21  Reason for Discharge:  Patient reached a stable level of exercise. Patient independent in their exercise. Patient has met program and personal goals.  Smoking History:  Social History   Tobacco Use  Smoking Status Former Smoker  . Packs/day: 1.00  . Years: 20.00  . Pack years: 20.00  . Types: Cigarettes  . Quit date: 11/22/1992  . Years since quitting: 26.5  Smokeless Tobacco Never Used    Diagnosis:  S/P CABG x 1 08/10/18  S/P MVR (mitral valve repair) 08/10/18 Maze Procedure, Atrial Appendage Clip  ADL UCSD:   Initial Exercise Prescription:   Discharge Exercise Prescription (Final Exercise Prescription Changes): Exercise Prescription Changes - 01/31/19 0859      Response to Exercise   Blood Pressure (Admit)  120/68    Blood Pressure (Exercise)  140/80    Blood Pressure (Exit)  106/62    Heart Rate (Admit)  99 bpm    Heart Rate (Exercise)  114 bpm    Heart Rate (Exit)  101 bpm    Rating of Perceived Exertion (Exercise)  11    Symptoms  none    Duration  Progress to 30 minutes of  aerobic without signs/symptoms of physical distress    Intensity  THRR unchanged      Progression   Progression  Continue to progress workloads to maintain intensity without signs/symptoms of physical distress.    Average METs  2.4      Resistance Training   Training Prescription  No      Interval Training   Interval Training  No      Recumbant Bike   Level  2    Watts  15    Minutes  10    METs  2.5      NuStep   Level  2    SPM  85    Minutes  20    METs  2.1      Track   Laps  16    Minutes  10    METs  3.76       Functional Capacity:   Psychological, QOL,  Others - Outcomes: PHQ 2/9: Depression screen [facility] 2/9 11/27/2018 12/30/2016 06/24/2016  Decreased Interest 0 0 0  Down, Depressed, Hopeless 0 - 0  PHQ - 2 Score 0 0 0  Some recent data might be hidden    Quality of Life:   Personal Goals: Goals established at orientation with interventions provided to work toward goal.    Personal Goals Discharge: Goals and Risk Factor Review    Row Name 11/27/18 1425 12/28/18 0836 01/23/19 1353 02/07/19 1211       Core Components/Risk Factors/Patient Goals Review   Personal Goals Review  Weight Management/Obesity;Hypertension;Lipids;Stress  Weight Management/Obesity;Hypertension;Lipids;Stress  Weight Management/Obesity;Hypertension;Lipids;Stress  Weight Management/Obesity;Hypertension;Lipids;Stress    Review  pt with multiple CAD RF demonstrates willingness to participate in CR program. pt eager to increase strength/stamina and decrease abdominal fat.    pt with multiple CAD RF demonstrates willingness to participate in CR program. pt eager to increase strength/stamina and decrease abdominal fat.  pt has continued DOE wtih more than usual activity.  pt is doing some stretching and  walking at home.  pt lives alone.   pt with multiple CAD RF demonstrates willingness to participate in CR program. pt eager to increase strength/stamina and decrease abdominal fat.  pt has continued DOE wtih more than usual activity.  pt is doing some stretching and walking at home.  pt lives alone. pt has added 30 minute HEP to home routine. 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    pt with multiple CAD RF demonstrates willingness to participate in CR program. pt eager to increase strength/stamina and decrease abdominal fat.  pt has continued DOE wtih more than usual activity.  pt is doing some stretching and walking at home.  pt lives alone. pt has added 30 minute HEP to home routine. 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   pt currently on hold for departmental closing for COVID  19 precautions.     Expected Outcomes  pt will participate in CR exercise, nutrition and lifstyle modification to decrease overall RF.   pt will participate in CR exercise, nutrition and lifstyle modification to decrease overall RF.   pt will participate in CR exercise, nutrition and lifstyle modification to decrease overall RF.   pt will participate in CR exercise, nutrition and lifstyle modification to decrease overall RF.        Exercise Goals and Review:   Exercise Goals Re-Evaluation: Exercise Goals Re-Evaluation    Row Name 11/27/18 1424 12/28/18 1039 01/23/19 1118 02/09/19 0900       Exercise Goal Re-Evaluation   Exercise Goals Review  Increase Physical Activity;Able to understand and use rate of perceived exertion (RPE) scale  Increase Physical Activity;Increase Strength and Stamina;Understanding of Exercise Prescription;Knowledge and understanding of Target Heart Rate Range (THRR);Able to understand and use rate of perceived exertion (RPE) scale;Able to check pulse independently  Increase Physical Activity;Increase Strength and Stamina;Understanding of Exercise Prescription;Knowledge and understanding of Target Heart Rate Range (THRR);Able to understand and use rate of perceived exertion (RPE) scale;Able to check pulse independently  Increase Physical Activity;Increase Strength and Stamina;Understanding of Exercise Prescription;Knowledge and understanding of Target Heart Rate Range (THRR);Able to understand and use rate of perceived exertion (RPE) scale;Able to check pulse independently    Comments  Patient able to understand and use RPE scale appropriately.  Reviewed METs and goals with Pt. Pt MET level is a 2.3 and is increasing gradually. Pt is not currently exercising at home. Encouraged Pt to walk 2-3 days per week for 30 minutes in addition to the program.   Reviewed METs and goals with Pt. Pt MET level is a 2.5 and is increasing gradually. Pt is walking 2-3 days per week for 30 minutes  in addition to the program.   Unable to review METs and goals with Pt due to closure of Cardiac Rehab for [place]. Pt MET level is 2.4. Pt is progressing well.     Expected Outcomes  Increase workloads as tolerated to help improve cardiorespiratory fitness.  Will continue to monitor and progress Pt as tolerated.   Will continue to monitor and progress Pt as tolerated.   Will continue to monitor and progress Pt as tolerated.        Nutrition & Weight - Outcomes:    Nutrition: Nutrition Therapy & Goals - 11/24/18 1158      Nutrition Therapy   Diet  heart healthy      Personal Nutrition Goals   Nutrition Goal  Pt to eat minimum of 3 meals daily    Personal Goal #2  pt to continue to follow heart healthy diet      Intervention Plan   Intervention  Prescribe, educate and counsel regarding individualized specific dietary modifications aiming towards targeted core components such as weight, hypertension, lipid management, diabetes, heart failure and other comorbidities.    Expected Outcomes  Long Term Goal: Adherence to prescribed nutrition plan.       Nutrition Discharge:   Education Questionnaire Score:   Goals reviewed with patient; copy given to patient.

## 2019-05-22 NOTE — Telephone Encounter (Signed)
-----   Message from [name], MD sent at 05/22/2019 11:20 AM EDT ----- Regarding: RE: Ok to return to on site cardiac rehab She should continue with remote. ----- Message ----- From: Rowe Pavy, RN Sent: 05/21/2019  12:29 PM EDT To: [name], MD Subject: Ok to return to on site cardiac rehab           [name]  We are preparing for current patients to phase in reentry to in facility cardiac rehab. A great deal of planning with advisement from our Medical Director - Dr. Radford Pax, CV Service line leadership, Infection Disease Control, Facilities, security,recommendations from American Association of Cardiac and Pulmonary Rehab (AACVPR) with the goal for optimal patient safety. Patients will have strict guidelines and criteria they must adhere and follow.  Pt will have to complete screenings prior to their scheduled appointment and  again prior to entry into gym area.  Your pt,  Michele Green expressed great interest in returning to in facility cardiac rehab. Pt has a Covid risk score 7(high)  .  Do you feel this pt is appropriate to resume in facility cardiac rehab?  Any additional restrictions you feel are appropriate for this pt?   Thank you and we appreciate your valued input.  Cherre Huger, BSN Cardiac and Training and development officer

## 2019-05-27 ENCOUNTER — Other Ambulatory Visit: Payer: Self-pay | Admitting: Cardiology

## 2019-05-28 NOTE — Telephone Encounter (Signed)
Left message for pt to call back.  Request is for Furosemide 41m QD but chart says prescribed as 224mQD PRN for swelling.  Need to clarify with pt how she is taking.

## 2019-05-29 ENCOUNTER — Other Ambulatory Visit: Payer: Medicare Other

## 2019-05-29 NOTE — Telephone Encounter (Signed)
Ok

## 2019-05-30 ENCOUNTER — Ambulatory Visit
Admission: RE | Admit: 2019-05-30 | Discharge: 2019-05-30 | Disposition: A | Discharge status: Home / Self Care | Payer: Medicare Other | Source: Ambulatory Visit | Attending: Obstetrics and Gynecology | Admitting: Obstetrics and Gynecology

## 2019-05-30 DIAGNOSIS — R3915 Urgency of urination: Secondary | ICD-10-CM

## 2019-05-30 DIAGNOSIS — R102 Pelvic and perineal pain: Secondary | ICD-10-CM

## 2019-05-30 DIAGNOSIS — R109 Unspecified abdominal pain: Secondary | ICD-10-CM

## 2019-05-30 MED ORDER — IOPAMIDOL (ISOVUE-300) INJECTION 61%
100.0000 mL | Freq: Once | INTRAVENOUS | Status: AC | PRN
  Administered 2019-05-30: 100 mL via INTRAVENOUS

## 2019-05-31 NOTE — Telephone Encounter (Signed)
Spoke with pt and she takes 68m QD PRN.  States she did not request the refill and has Furosemide at home.  Advised I will refuse medication refill and to let [place]now when she does need one.  Pt appreciative for call.

## 2019-06-05 ENCOUNTER — Telehealth: Payer: Self-pay | Admitting: Interventional Cardiology

## 2019-06-05 NOTE — Telephone Encounter (Signed)
° °  ° °  [facility] Pre-operative Risk Assessment    Request for surgical clearance:  1. What type of surgery is being performed? bladder biposy  2. When is this surgery scheduled? 06/20/19  3. What type of clearance is required (medical clearance vs. Pharmacy clearance to hold med vs. Both)? pharmacy  4. Are there any medications that need to be held prior to surgery and how long? xarelto for  2 days and aspirin for 5 days  5. Practice name and name of physician performing surgery? Dr. Louis Meckel, [facility]  6. What is your office phone number : [phone] x [phone]   7.   What is your office fax number: [phone]   8.   Anesthesia type (None, local, MAC, general) ? general   Michele Green 06/05/2019, 2:27 PM  _________________________________________________________________   (provider comments below) - -

## 2019-06-05 NOTE — Telephone Encounter (Signed)
Okay to hold aspirin and Xarelto as required.

## 2019-06-05 NOTE — Telephone Encounter (Signed)
[name] may pt hold ASA for 5 days for bladder biopsy?  Just send answer to pre-op pool thanks.

## 2019-06-05 NOTE — Telephone Encounter (Signed)
Pharm please address xarelto thanks

## 2019-06-05 NOTE — Telephone Encounter (Signed)
Pt takes Xarelto for afib with CHADS2VASc score of 6 (age x2, sex, CHF, HTN, CAD). Renal function is normal. Ok to hold Xarelto 2 days prior to biopsy.

## 2019-06-06 ENCOUNTER — Other Ambulatory Visit: Payer: Self-pay | Admitting: Urology

## 2019-06-16 ENCOUNTER — Other Ambulatory Visit (HOSPITAL_COMMUNITY)
Admission: RE | Admit: 2019-06-16 | Discharge: 2019-06-16 | Disposition: A | Discharge status: Home / Self Care | Payer: Medicare Other | Source: Ambulatory Visit | Attending: Urology | Admitting: Urology

## 2019-06-16 DIAGNOSIS — Z1159 Encounter for screening for other viral diseases: Secondary | ICD-10-CM | POA: Diagnosis present

## 2019-06-16 LAB — SARS CORONAVIRUS 2 (TAT 6-24 HRS): SARS Coronavirus 2: NEGATIVE

## 2019-06-18 ENCOUNTER — Other Ambulatory Visit: Payer: Self-pay

## 2019-06-18 ENCOUNTER — Encounter (HOSPITAL_BASED_OUTPATIENT_CLINIC_OR_DEPARTMENT_OTHER): Payer: Self-pay | Admitting: *Deleted

## 2019-06-18 NOTE — Progress Notes (Signed)
Spoke with patient via telephone for pre op interview. NPO after MN. Patient to take Lopressor and Lialda with a sip of water AM of surgery. Current EKG in chart and Epic. Patient will need Istat 8 AM of surgery. Arrival time 0530.

## 2019-06-20 ENCOUNTER — Ambulatory Visit (HOSPITAL_BASED_OUTPATIENT_CLINIC_OR_DEPARTMENT_OTHER)
Admission: RE | Admit: 2019-06-20 | Discharge: 2019-06-20 | Disposition: A | Discharge status: Home / Self Care | Payer: Medicare Other | Attending: Urology | Admitting: Urology

## 2019-06-20 ENCOUNTER — Ambulatory Visit (HOSPITAL_BASED_OUTPATIENT_CLINIC_OR_DEPARTMENT_OTHER): Payer: Medicare Other | Admitting: Anesthesiology

## 2019-06-20 ENCOUNTER — Encounter (HOSPITAL_BASED_OUTPATIENT_CLINIC_OR_DEPARTMENT_OTHER): Payer: Self-pay

## 2019-06-20 ENCOUNTER — Encounter (HOSPITAL_BASED_OUTPATIENT_CLINIC_OR_DEPARTMENT_OTHER)
Admission: RE | Disposition: A | Discharge status: Home / Self Care | Payer: Self-pay | Source: Home / Self Care | Attending: Urology

## 2019-06-20 DIAGNOSIS — Z7982 Long term (current) use of aspirin: Secondary | ICD-10-CM | POA: Diagnosis not present

## 2019-06-20 DIAGNOSIS — R300 Dysuria: Secondary | ICD-10-CM | POA: Diagnosis not present

## 2019-06-20 DIAGNOSIS — I2510 Atherosclerotic heart disease of native coronary artery without angina pectoris: Secondary | ICD-10-CM | POA: Insufficient documentation

## 2019-06-20 DIAGNOSIS — Z79899 Other long term (current) drug therapy: Secondary | ICD-10-CM | POA: Insufficient documentation

## 2019-06-20 DIAGNOSIS — R310 Gross hematuria: Secondary | ICD-10-CM

## 2019-06-20 DIAGNOSIS — Z951 Presence of aortocoronary bypass graft: Secondary | ICD-10-CM | POA: Diagnosis not present

## 2019-06-20 DIAGNOSIS — Z7901 Long term (current) use of anticoagulants: Secondary | ICD-10-CM | POA: Diagnosis not present

## 2019-06-20 DIAGNOSIS — N3021 Other chronic cystitis with hematuria: Secondary | ICD-10-CM | POA: Insufficient documentation

## 2019-06-20 DIAGNOSIS — Z87440 Personal history of urinary (tract) infections: Secondary | ICD-10-CM | POA: Insufficient documentation

## 2019-06-20 DIAGNOSIS — N3289 Other specified disorders of bladder: Secondary | ICD-10-CM | POA: Diagnosis not present

## 2019-06-20 DIAGNOSIS — Z87891 Personal history of nicotine dependence: Secondary | ICD-10-CM | POA: Insufficient documentation

## 2019-06-20 DIAGNOSIS — I10 Essential (primary) hypertension: Secondary | ICD-10-CM | POA: Diagnosis not present

## 2019-06-20 HISTORY — PX: CYSTOSCOPY W/ RETROGRADES: SHX1426

## 2019-06-20 HISTORY — PX: CYSTOSCOPY WITH HYDRODISTENSION AND BIOPSY: SHX5127

## 2019-06-20 LAB — POCT I-STAT, CHEM 8
BUN: 15 mg/dL (ref 8–23)
Calcium, Ion: 1.32 mmol/L (ref 1.15–1.40)
Chloride: 107 mmol/L (ref 98–111)
Creatinine, Ser: 0.7 mg/dL (ref 0.44–1.00)
Glucose, Bld: 84 mg/dL (ref 70–99)
HCT: 34 % — ABNORMAL LOW (ref 36.0–46.0)
Hemoglobin: 11.6 g/dL — ABNORMAL LOW (ref 12.0–15.0)
Potassium: 4 mmol/L (ref 3.5–5.1)
Sodium: 144 mmol/L (ref 135–145)
TCO2: 23 mmol/L (ref 22–32)

## 2019-06-20 SURGERY — CYSTOSCOPY, WITH BLADDER HYDRODISTENSION AND BIOPSY
Anesthesia: General

## 2019-06-20 MED ORDER — PHENYLEPHRINE 40 MCG/ML (10ML) SYRINGE FOR IV PUSH (FOR BLOOD PRESSURE SUPPORT)
PREFILLED_SYRINGE | INTRAVENOUS | Status: AC
  Filled 2019-06-20: qty 10

## 2019-06-20 MED ORDER — PHENYLEPHRINE HCL (PRESSORS) 10 MG/ML IV SOLN
INTRAVENOUS | Status: AC
  Filled 2019-06-20: qty 1

## 2019-06-20 MED ORDER — LIDOCAINE HCL (CARDIAC) PF 100 MG/5ML IV SOSY
PREFILLED_SYRINGE | INTRAVENOUS | Status: DC | PRN
  Administered 2019-06-20: 40 mg via INTRAVENOUS

## 2019-06-20 MED ORDER — PHENAZOPYRIDINE HCL 200 MG PO TABS: 200.0000 mg | ORAL_TABLET | Freq: Three times a day (TID) | ORAL | 0 refills | Status: DC | PRN

## 2019-06-20 MED ORDER — SODIUM CHLORIDE 0.9 % IV SOLN
INTRAVENOUS | Status: DC | PRN
  Administered 2019-06-20: 50 ug/min via INTRAVENOUS

## 2019-06-20 MED ORDER — PROPOFOL 10 MG/ML IV BOLUS
INTRAVENOUS | Status: AC
  Filled 2019-06-20: qty 20

## 2019-06-20 MED ORDER — LIDOCAINE 2% (20 MG/ML) 5 ML SYRINGE
INTRAMUSCULAR | Status: AC
  Filled 2019-06-20: qty 5

## 2019-06-20 MED ORDER — GLYCOPYRROLATE 0.2 MG/ML IJ SOLN
INTRAMUSCULAR | Status: DC | PRN
  Administered 2019-06-20: 0.1 mg via INTRAVENOUS

## 2019-06-20 MED ORDER — BELLADONNA ALKALOIDS-OPIUM 16.2-60 MG RE SUPP
RECTAL | Status: DC | PRN
  Administered 2019-06-20: 1 via RECTAL

## 2019-06-20 MED ORDER — BELLADONNA ALKALOIDS-OPIUM 16.2-60 MG RE SUPP
RECTAL | Status: AC
  Filled 2019-06-20: qty 1

## 2019-06-20 MED ORDER — CIPROFLOXACIN IN D5W 400 MG/200ML IV SOLN
INTRAVENOUS | Status: AC
  Filled 2019-06-20: qty 200

## 2019-06-20 MED ORDER — LACTATED RINGERS IV SOLN
INTRAVENOUS | Status: DC
  Administered 2019-06-20: 07:00:00 via INTRAVENOUS
  Filled 2019-06-20: qty 1000

## 2019-06-20 MED ORDER — GLYCOPYRROLATE PF 0.2 MG/ML IJ SOSY
PREFILLED_SYRINGE | INTRAMUSCULAR | Status: AC
  Filled 2019-06-20: qty 1

## 2019-06-20 MED ORDER — IOHEXOL 300 MG/ML  SOLN
INTRAMUSCULAR | Status: DC | PRN
  Administered 2019-06-20: 08:00:00 14 mL via URETHRAL

## 2019-06-20 MED ORDER — PROPOFOL 10 MG/ML IV BOLUS
INTRAVENOUS | Status: DC | PRN
  Administered 2019-06-20 (×2): 30 mg via INTRAVENOUS
  Administered 2019-06-20: 20 mg via INTRAVENOUS
  Administered 2019-06-20: 100 mg via INTRAVENOUS

## 2019-06-20 MED ORDER — ACETAMINOPHEN 500 MG PO TABS
1000.0000 mg | ORAL_TABLET | Freq: Once | ORAL | Status: AC
  Administered 2019-06-20: 1000 mg via ORAL
  Filled 2019-06-20: qty 2

## 2019-06-20 MED ORDER — FENTANYL CITRATE (PF) 100 MCG/2ML IJ SOLN
25.0000 ug | INTRAMUSCULAR | Status: DC | PRN
  Filled 2019-06-20: qty 1

## 2019-06-20 MED ORDER — ONDANSETRON HCL 4 MG/2ML IJ SOLN
INTRAMUSCULAR | Status: AC
  Filled 2019-06-20: qty 2

## 2019-06-20 MED ORDER — ACETAMINOPHEN 500 MG PO TABS
ORAL_TABLET | ORAL | Status: AC
  Filled 2019-06-20: qty 2

## 2019-06-20 MED ORDER — ONDANSETRON HCL 4 MG/2ML IJ SOLN
INTRAMUSCULAR | Status: DC | PRN
  Administered 2019-06-20: 4 mg via INTRAVENOUS

## 2019-06-20 MED ORDER — PHENYLEPHRINE HCL (PRESSORS) 10 MG/ML IV SOLN
INTRAVENOUS | Status: DC | PRN
  Administered 2019-06-20 (×7): 40 ug via INTRAVENOUS

## 2019-06-20 MED ORDER — FENTANYL CITRATE (PF) 100 MCG/2ML IJ SOLN
INTRAMUSCULAR | Status: DC | PRN
  Administered 2019-06-20: 25 ug via INTRAVENOUS
  Administered 2019-06-20 (×2): 12.5 ug via INTRAVENOUS
  Administered 2019-06-20 (×2): 25 ug via INTRAVENOUS

## 2019-06-20 MED ORDER — TRAMADOL HCL 50 MG PO TABS: 50.0000 mg | ORAL_TABLET | Freq: Four times a day (QID) | ORAL | 0 refills | Status: DC | PRN

## 2019-06-20 MED ORDER — CIPROFLOXACIN IN D5W 400 MG/200ML IV SOLN
400.0000 mg | INTRAVENOUS | Status: AC
  Administered 2019-06-20: 400 mg via INTRAVENOUS
  Filled 2019-06-20: qty 200

## 2019-06-20 MED ORDER — FENTANYL CITRATE (PF) 100 MCG/2ML IJ SOLN
INTRAMUSCULAR | Status: AC
  Filled 2019-06-20: qty 2

## 2019-06-20 MED ORDER — STERILE WATER FOR IRRIGATION IR SOLN
Status: DC | PRN
  Administered 2019-06-20 (×3): 3000 mL

## 2019-06-20 SURGICAL SUPPLY — 25 items
BAG DRAIN URO-CYSTO SKYTR STRL (DRAIN) ×4 IMPLANT
BAG DRN UROCATH (DRAIN) ×2
BASKET LASER NITINOL 1.9FR (BASKET) IMPLANT
BSKT STON RTRVL 120 1.9FR (BASKET)
CATH URET 5FR 28IN OPEN ENDED (CATHETERS) ×4 IMPLANT
CATH URET DUAL LUMEN 6-10FR 50 (CATHETERS) IMPLANT
CLOTH BEACON ORANGE TIMEOUT ST (SAFETY) ×4 IMPLANT
ELECT REM PT RETURN 9FT ADLT (ELECTROSURGICAL) ×4
ELECTRODE REM PT RTRN 9FT ADLT (ELECTROSURGICAL) ×2 IMPLANT
EXTRACTOR STONE 1.7FRX115CM (UROLOGICAL SUPPLIES) IMPLANT
FIBER LASER TRAC TIP (UROLOGICAL SUPPLIES) IMPLANT
GLOVE BIO SURGEON STRL SZ7.5 (GLOVE) ×4 IMPLANT
GOWN STRL REUS W/ TWL XL LVL3 (GOWN DISPOSABLE) ×2 IMPLANT
GOWN STRL REUS W/TWL XL LVL3 (GOWN DISPOSABLE) ×8 IMPLANT
GUIDEWIRE ANG ZIPWIRE 038X150 (WIRE) IMPLANT
GUIDEWIRE STR DUAL SENSOR (WIRE) ×4 IMPLANT
IV NS IRRIG 3000ML ARTHROMATIC (IV SOLUTION) ×8 IMPLANT
KIT TURNOVER CYSTO (KITS) ×4 IMPLANT
MANIFOLD NEPTUNE II (INSTRUMENTS) ×4 IMPLANT
NS IRRIG 500ML POUR BTL (IV SOLUTION) ×4 IMPLANT
PACK CYSTO (CUSTOM PROCEDURE TRAY) ×4 IMPLANT
TUBE CONNECTING 12'X1/4 (SUCTIONS)
TUBE CONNECTING 12X1/4 (SUCTIONS) IMPLANT
TUBING UROLOGY SET (TUBING) ×4 IMPLANT
WATER STERILE IRR 3000ML UROMA (IV SOLUTION) ×4 IMPLANT

## 2019-06-20 NOTE — Anesthesia Postprocedure Evaluation (Signed)
Anesthesia Post Note  Patient: Michele Green  Procedure(s) Performed: CYSTOSCOPY BLADDER  BIOPSY WITH FULGERATION (N/A ) CYSTOSCOPY WITH RETROGRADE PYELOGRAM (Bilateral )     Patient location during evaluation: PACU Anesthesia Type: General Level of consciousness: awake and alert Pain management: pain level controlled Vital Signs Assessment: post-procedure vital signs reviewed and stable Respiratory status: spontaneous breathing, nonlabored ventilation, respiratory function stable and patient connected to nasal cannula oxygen Cardiovascular status: blood pressure returned to baseline and stable Postop Assessment: no apparent nausea or vomiting Anesthetic complications: no    Last Vitals:  Vitals:   06/20/19 0915 06/20/19 0948  BP: 107/69 117/80  Pulse: 100 99  Resp: 15 18  Temp: (!) 36.3 C 36.4 C  SpO2: 100% 98%    Last Pain:  Vitals:   06/20/19 0948  TempSrc: Oral  PainSc: 0-No pain                 Rosalin Buster L Tandy Grawe

## 2019-06-20 NOTE — Transfer of Care (Signed)
Immediate Anesthesia Transfer of Care Note  Patient: Michele Green  Procedure(s) Performed: CYSTOSCOPY BLADDER  BIOPSY WITH FULGERATION (N/A ) CYSTOSCOPY WITH RETROGRADE PYELOGRAM (Bilateral )  Patient Location: PACU  Anesthesia Type:General  Level of Consciousness: awake, alert , oriented and patient cooperative  Airway & Oxygen Therapy: Patient Spontanous Breathing and Patient connected to nasal cannula oxygen  Post-op Assessment: Report given to RN and Post -op Vital signs reviewed and stable  Post vital signs: Reviewed and stable  Last Vitals:  Vitals Value Taken Time  BP 143/89 06/20/19 0845  Temp    Pulse 98 06/20/19 0846  Resp 10 06/20/19 0846  SpO2 100 % 06/20/19 0846  Vitals shown include unvalidated device data.  Last Pain:  Vitals:   06/20/19 0602  TempSrc:   PainSc: 0-No pain      Patients Stated Pain Goal: 5 (19/47/12 5271)  Complications: No apparent anesthesia complications

## 2019-06-20 NOTE — Interval H&P Note (Signed)
History and Physical Interval Note:  06/20/2019 7:03 AM  Michele Green  has presented today for surgery, with the diagnosis of abnormal bladder.  The various methods of treatment have been discussed with the patient and family. After consideration of risks, benefits and other options for treatment, the patient has consented to  Procedure(s): CYSTOSCOPY BLADDER  BIOPSY POSSIBLE HYDRODISTENSION (N/A) CYSTOSCOPY WITH RETROGRADE PYELOGRAM (Bilateral) as a surgical intervention.  The patient's history has been reviewed, patient examined, no change in status, stable for surgery.  I have reviewed the patient's chart and labs.  Questions were answered to the patient's satisfaction.     Ardis Hughs

## 2019-06-20 NOTE — Anesthesia Preprocedure Evaluation (Addendum)
Anesthesia Evaluation  Patient identified by MRN, date of birth, ID band Patient awake    Reviewed: Allergy & Precautions, NPO status , Patient's Chart, lab work & pertinent test results, reviewed documented beta blocker date and time   Airway Mallampati: III  TM Distance: >3 FB Neck ROM: Full  Mouth opening: Limited Mouth Opening  Dental no notable dental hx. (+) Teeth Intact, Dental Advisory Given   Pulmonary neg pulmonary ROS, former smoker,    Pulmonary exam normal breath sounds clear to auscultation       Cardiovascular hypertension, Pt. on home beta blockers and Pt. 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 Normal LV EF, trivial MR. Mild-moderate TR with RV-RA gradient of 37 mmHg. Trivial posterior pericardial effusion.   Neuro/Psych negative neurological ROS  negative psych ROS   GI/Hepatic negative GI ROS, Neg liver ROS,   Endo/Other  negative endocrine ROS  Renal/GU negative Renal ROS  negative genitourinary   Musculoskeletal negative musculoskeletal ROS (+)   Abdominal   Peds  Hematology  (+) Blood dyscrasia (on xarelto, last dose Sat 7/25), ,   Anesthesia Other Findings   Reproductive/Obstetrics negative OB ROS                            Anesthesia Physical Anesthesia Plan  ASA: III  Anesthesia Plan: General   Post-op Pain Management:    Induction: Intravenous  PONV Risk Score and Plan: 3 and Treatment may vary due to age or medical condition, Ondansetron and Dexamethasone  Airway Management Planned: LMA  Additional Equipment:   Intra-op Plan:   Post-operative Plan: Extubation in OR  Informed Consent: I have reviewed the  patients History and Physical, chart, labs and discussed the procedure including the risks, benefits and alternatives for the proposed anesthesia with the patient or authorized representative who has indicated his/her understanding and acceptance.     Dental advisory given  Plan Discussed with: CRNA  Anesthesia Plan Comments:         Anesthesia Quick Evaluation

## 2019-06-20 NOTE — Discharge Instructions (Signed)
Transurethral Bladder Biopsy   Definition:  Transurethral Resection of the Bladder Tumor is a surgical procedure used to diagnose and remove tumors within the bladder. TURBT is the most common treatment for early stage bladder cancer.  General instructions:     Your recent bladder surgery requires very little post hospital care but some definite precautions.  Despite the fact that no skin incisions were used, the area around the bladder incisions are raw and covered with scabs to promote healing and prevent bleeding. Certain precautions are needed to insure that the scabs are not disturbed over the next 2-4 weeks while the healing proceeds.  Because the raw surface inside your bladder and the irritating effects of urine you may expect frequency of urination and/or urgency (a stronger desire to urinate) and perhaps even getting up at night more often. This will usually resolve or improve slowly over the healing period. You may see some blood in your urine over the first 6 weeks. Do not be alarmed, even if the urine was clear for a while. Get off your feet and drink lots of fluids until clearing occurs. If you start to pass clots or don't improve call us.  Diet:  You may return to your normal diet immediately. Because of the raw surface of your bladder, alcohol, spicy foods, foods high in acid and drinks with caffeine may cause irritation or frequency and should be used in moderation. To keep your urine flowing freely and avoid constipation, drink plenty of fluids during the day (8-10 glasses). Tip: Avoid cranberry juice because it is very acidic.  Activity:  Your physical activity doesn't need to be restricted. However, if you are very active, you may see some blood in the urine. We suggest that you reduce your activity under the circumstances until the bleeding has stopped.  Bowels:  It is important to keep your bowels regular during the postoperative period. Straining with bowel movements  can cause bleeding. A bowel movement every other day is reasonable. Use a mild laxative if needed, such as milk of magnesia 2-3 tablespoons, or 2 Dulcolax tablets. Call if you continue to have problems. If you had been taking narcotics for pain, before, during or after your surgery, you may be constipated. Take a laxative if necessary.    Medication:  You should resume your pre-surgery medications unless told not to. In addition you may be given an antibiotic to prevent or treat infection. Antibiotics are not always necessary. All medication should be taken as prescribed until the bottles are finished unless you are having an unusual reaction to one of the drugs.  Resume Xarelto in 48 hours, unless there is on-going blood in your urine.     Post Anesthesia Home Care Instructions  Activity: Get plenty of rest for the remainder of the day. A responsible individual must stay with you for 24 hours following the procedure.  For the next 24 hours, DO NOT: -Drive a car -Paediatric nurse -Drink alcoholic beverages -Take any medication unless instructed by your physician -Make any legal decisions or sign important papers.  Meals: Start with liquid foods such as gelatin or soup. Progress to regular foods as tolerated. Avoid greasy, spicy, heavy foods. If nausea and/or vomiting occur, drink only clear liquids until the nausea and/or vomiting subsides. Call your physician if vomiting continues.  Special Instructions/Symptoms: Your throat may feel dry or sore from the anesthesia or the breathing tube placed in your throat during surgery. If this causes discomfort, gargle with warm salt  water. The discomfort should disappear within 24 hours.

## 2019-06-20 NOTE — Anesthesia Procedure Notes (Signed)
Procedure Name: LMA Insertion Date/Time: 06/20/2019 7:38 AM Performed by: Justice Rocher, CRNA Pre-anesthesia Checklist: Patient identified, Emergency Drugs available, Suction available and Patient being monitored Patient Re-evaluated:Patient Re-evaluated prior to induction Oxygen Delivery Method: Circle system utilized Preoxygenation: Pre-oxygenation with 100% oxygen Induction Type: IV induction Ventilation: Mask ventilation without difficulty LMA: LMA inserted LMA Size: 4.0 Number of attempts: 1 Airway Equipment and Method: Bite block Placement Confirmation: positive ETCO2 and breath sounds checked- equal and bilateral Tube secured with: Tape Dental Injury: Teeth and Oropharynx as per pre-operative assessment

## 2019-06-20 NOTE — H&P (Signed)
f/u for recurrent urinary tract infections  HPI: Michele Green is a 78 year-old female established patient who is here today for interval evaluation of recurrent UTIs.  The patient was last seen May 2020.   The patient has been treated for a UTI since last visit. In fact, the patient has been treated 1 time(s) for infection. The patient is not currently taking suppressive antibiotics.   In the past the patient has not taken suppressive abx. She is not also taking cranberry tablets. The patient is not taking a probiotic.   The patient has not noted significant changes in their urinary tract symptoms since their last visit. She feels as if she does empty her bladder with each void. She does urinate more frequently than once every 2 hours in the daytime. She does have frequency and urgency. The patient denies having urinary incontinence. She does have burning or discomfort when she urinates. The patient has not noticed visible blood in their urine since the last visit.   The patient does not have a history of constipation.   She is seen today for recurrent urinary tract infections. She has had some difficulty over the past several years with dysuria and has been treated empirically with antibiotics. A culture in 4/20 was positive for E coli. The she most recently reported a 1 week history of dysuria, frequency and urgency on 04/12/19 with nitrite positive urine. The culture results were not supplied. She was treated empirically with nitrofurantoin.   The patient saw Dr. Karsten Ro initially in May and a clean catch urine was negative, despite symptoms. She continued to have pain and associated LUTs and a CT scan was performed. This demonstrated thickened and enhancing urothelium.     ALLERGIES: No Known Drug Allergies    MEDICATIONS: Allopurinol 100 mg tablet  Aspirin  Metoprolol Tartrate  Phenazopyridine Hcl 200 mg tablet 1 tablet PO Q 6 H  Bactrim  Furosemide 20 mg tablet  Potassium Chloride   Sulfasalazine  Tylenol 325 mg capsule  Xarelto 20 mg tablet  Zocor 20 mg tablet     GU PSH: None   NON-GU PSH: CABG (coronary artery bypass grafting)     GU PMH: Chronic cystitis (w/o hematuria), She appears to have had infections that have not cleared with antibiotics. She just completed a course of nitrofurantoin. I have obtained a catheterized specimen for culture today and will treat according to sensitivities. - 04/19/2019 Postmenopausal atrophic vaginitis, She seems to have a lot of irritation of the inner labia and urethral meatal region. I would typically use a topical estrogen however she has had previous breast cancer therefore I am going to refer her to a gynecologist to see they have any options for topical therapy as I think this is her primary problem. - 04/19/2019    NON-GU PMH: Pyuria/other UA findings, She did have some pyuria and bacteriuria and her clean-catch specimen also had a lot of epithelial cells so I obtained a catheterized specimen. - 04/19/2019    FAMILY HISTORY: 1 son - Runs in Family   SOCIAL HISTORY: Marital Status: Widowed Preferred Language: English; Ethnicity: Not Hispanic Or Latino; Race: White Current Smoking Status: Patient does not smoke anymore.   Tobacco Use Assessment Completed: Used Tobacco in last 30 days? Types of alcohol consumed: Wine.  Does not use drugs. Drinks 1 caffeinated drink per day.    REVIEW OF SYSTEMS:    GU Review Female:   Patient reports hard to postpone urination and burning /pain with  urination. Patient denies frequent urination, get up at night to urinate, leakage of urine, stream starts and stops, trouble starting your stream, have to strain to urinate, and being pregnant.  Gastrointestinal (Upper):   Patient denies nausea, vomiting, and indigestion/ heartburn.  Gastrointestinal (Lower):   Patient denies diarrhea and constipation.  Constitutional:   Patient denies fever, night sweats, weight loss, and fatigue.  Skin:    Patient reports skin rash/ lesion and itching.   Eyes:   Patient denies blurred vision and double vision.  Ears/ Nose/ Throat:   Patient denies sore throat and sinus problems.  Hematologic/Lymphatic:   Patient denies swollen glands and easy bruising.  Cardiovascular:   Patient denies leg swelling and chest pains.  Respiratory:   Patient denies cough and shortness of breath.  Endocrine:   Patient denies excessive thirst.  Musculoskeletal:   Patient reports back pain. Patient denies joint pain.  Neurological:   Patient denies dizziness and headaches.  Psychologic:   Patient denies depression and anxiety.   Notes: PT has rash on legs, PCP prescribed an Abx ( Bactrim)     VITAL SIGNS:      06/04/2019 07:51 AM  Weight 124 lb / 56.25 kg  Height 64 in / 162.56 cm  BP 107/71 mmHg  Heart Rate 106 /min  Temperature 98.2 F / 36.7 C  BMI 21.3 kg/m   GU PHYSICAL EXAMINATION:    Vagina: Diffuse perivaginal wall tenderness   MULTI-SYSTEM PHYSICAL EXAMINATION:       PAST DATA REVIEWED:  Source Of History:  Patient  Records Review:   Previous Doctor Records, Previous Patient Records, POC Tool  X-Ray Review: C.T. Abdomen/Pelvis: Reviewed Films. Discussed With Patient.     PROCEDURES:         Flexible Cystoscopy - 52000  Risks, benefits, and some of the potential complications of the procedure were discussed at length with the patient including infection, bleeding, voiding discomfort, urinary retention, fever, chills, sepsis, and others. All questions were answered. Informed consent was obtained. Antibiotic prophylaxis was given. Sterile technique and intraurethral analgesia were used.  Meatus:  Normal size. Normal location. Normal condition.  Urethra:  No hypermobility. No leakage.  Ureteral Orifices:  Normal location. Normal size. Normal shape. Effluxed clear urine.  Bladder:  She around the trigonal region in the posterior wall of the patient's bladder she had significant inflammation and  erythema. There was some sloughed tissue on the surface of these areas. There no discrete lesions.       The lower urinary tract was carefully examined. The procedure was well-tolerated and without complications. Antibiotic instructions were given. Instructions were given to call the office immediately for bloody urine, difficulty urinating, urinary retention, painful or frequent urination, fever, chills, nausea, vomiting or other illness. The patient stated that she understood these instructions and would comply with them.         Urinalysis w/Scope Dipstick Dipstick Cont'd Micro  Color: Yellow Bilirubin: Neg mg/dL WBC/hpf: >60/hpf  Appearance: Cloudy Ketones: Neg mg/dL RBC/hpf: 0 - 2/hpf  Specific Gravity: 1.015 Blood: Trace ery/uL Bacteria: Few (10-25/hpf)  pH: 5.5 Protein: 1+ mg/dL Cystals: NS (Not Seen)  Glucose: Neg mg/dL Urobilinogen: 0.2 mg/dL Casts: NS (Not Seen)    Nitrites: Neg Trichomonas: Not Present    Leukocyte Esterase: 3+ leu/uL Mucous: Not Present      Epithelial Cells: 0 - 5/hpf      Yeast: NS (Not Seen)      Sperm: Not Present    ASSESSMENT:  ICD-10 Details  1 GU:   Chronic cystitis (w/o hematuria) - N30.20   2   Pelvic/perineal pain - R10.2    PLAN:            Medications New Meds: Valium 10 mg tablet 1 tablet Per Vagina Q HS   #30  2 Refill(s)            Orders Labs Urine Culture          Schedule Return Visit/Planned Activity: ASAP - Schedule Surgery          Document Letter(s):  Created for Patient: Clinical Summary         Notes:   The patient has diffuse inflammation with some ulcerative areas within the bladder. The etiology of this is unclear, but may represent either interstitial cystitis, cis, or severe recurrence urinary tract infections. She also has surrounding pelvic muscle pain. I discussed this with her in detail.  I recommended that we go to the operating room for a biopsy of these areas with fulguration. Will also perform  bilateral retrograde pyelograms. We may also decide to perform hydrodistention depending on the findings. Once we have ruled out CIS, I think the patient would benefit from a low dose daily suppressive antibiotic. I have also gone over the IC dietary guidelines to help minimize irritation and burning. She will continue with the [place] 0. I have also prescribed her Myrbetriq as well as intravaginal Valium.

## 2019-06-20 NOTE — Op Note (Signed)
Preoperative diagnosis:  1. Dysuria, severe irritative voiding symptoms 2. Hematuria 3. Abnormal bladder mucosa  Postoperative diagnosis:  1. Same  Procedure: 1. Cystoscopy, bilateral retrograde pyelograms with interpretation 2. Bladder biopsy and fulguration, less than 5 mm  Surgeon: Ardis Hughs, MD  Anesthesia: General  Complications: None  Intraoperative findings:  #1: 5 cc of Omnipaque contrast was instilled through a 5 [place] open-ended ureteral catheter at into the distal left ureteral orifice under fluoroscopic guidance.  This demonstrated a normal caliber ureter with no filling defects, hydroureteronephrosis or significant abnormalities. #2: 5 cc of Omnipaque contrast was instilled into the patient's right ureteral orifice with a 5 [place] open-ended ureteral catheter demonstrating a normal caliber ureter with no filling defects or hydroureteronephrosis.  The calyces were sharp.  There were no significant abnormalities. #3: The patient had some erythema along the trigonal region and left posterior lateral wall of the bladder.  5 separate biopsies were taken within these areas and then the area was copiously fulgurated.  EBL: Minimal  Specimens: Bladder biopsies and 5 separate areas along the trigone and left posterior lateral region  Indication: Michele Green is a 78 y.o. patient with severe irritative voiding symptoms and hematuria.  Cystoscopy in clinic demonstrated ulcerative mucosal lesions with associated erythema.  After reviewing the management options for treatment, he elected to proceed with the above surgical procedure(s). We have discussed the potential benefits and risks of the procedure, side effects of the proposed treatment, the likelihood of the patient achieving the goals of the procedure, and any potential problems that might occur during the procedure or recuperation. Informed consent has been obtained.  Description of procedure:  The patient was  taken to the operating room and general anesthesia was induced.  The patient was placed in the dorsal lithotomy position, prepped and draped in the usual sterile fashion, and preoperative antibiotics were administered. A preoperative time-out was performed.   30 degrees cystoscope was then gently passed through the patient's urethra with a 21 French sheath under visual guidance.  Cystoscopy was then performed with the above findings.  I then exchanged the 30 degree lens for the 70 degree lens and repeated the exam with no additional findings.  I then repassed the 30 degree lens and performed retrograde pyelograms with the above findings.  I then used the cold cup biopsy forceps and biopsied the areas in the trigone and the posterior lateral wall on the left side.  These areas were then fulgurated with the Bugbee cautery and hemostasis was achieved.  I then fulgurated the other areas of abnormality in and around the biopsy regions.  Once I was satisfied that all the areas had been fulgurated and there is no ongoing bleeding I emptied the bladder and remove the scope.  I placed a B&O suppository in the patient's rectum.  She was subsequently extubated return to the PACU in good condition.  Disposition: The patient will be discharged home and follow up with me as scheduled in 2 weeks.  Ardis Hughs, M.D.

## 2019-06-21 ENCOUNTER — Encounter (HOSPITAL_BASED_OUTPATIENT_CLINIC_OR_DEPARTMENT_OTHER): Payer: Self-pay | Admitting: Urology

## 2019-07-20 ENCOUNTER — Other Ambulatory Visit: Payer: Self-pay | Admitting: Interventional Cardiology

## 2019-07-20 NOTE — Telephone Encounter (Addendum)
Xarelto 62m refill request received; pt is 78yrs old, wt-57.5kg, Cre[facility]0.92 [facility], last Telemedicine visit by Dr. STamala Julianon 03/29/2019, Diagnosis-Afib, CrCl-44.266mmin; will send in refill to requested pharmacy.

## 2019-07-23 ENCOUNTER — Other Ambulatory Visit: Payer: Self-pay | Admitting: Pharmacist

## 2019-07-23 ENCOUNTER — Telehealth: Payer: Self-pay | Admitting: Pharmacist

## 2019-07-23 NOTE — Telephone Encounter (Signed)
Patient's insurance company notified us that the patient was newly prescribed meloxicam by her urologist. She is also taking Xarelto. I spoke with the patient and confirmed that she takes the meloxicam only when needed. I advised her to continue to take it sparingly as it increases the risk of bleeds, and I reviewed the signs of bleeding with her. I also told her that we sent in her Xarelto refill last Friday (8/28). She verbalized understanding.  Richardine Service, PharmD PGY1 Pharmacy Resident 07/23/2019  10:46 AM

## 2019-07-23 NOTE — Telephone Encounter (Signed)
rx for xarelto already sent in on 8/28

## 2019-07-24 ENCOUNTER — Telehealth: Payer: Self-pay

## 2019-07-24 NOTE — Telephone Encounter (Signed)
LVMTCB regarding upcoming appointment with Dr Tamala Julian. Need to change from in office visit to virtual appointment.

## 2019-07-25 ENCOUNTER — Telehealth: Payer: Self-pay

## 2019-07-25 NOTE — Progress Notes (Signed)
Error

## 2019-07-25 NOTE — Progress Notes (Signed)
Virtual Visit via Telephone Note   This visit type was conducted due to national recommendations for restrictions regarding the COVID-19 Pandemic (e.g. social distancing) in an effort to limit this patient's exposure and mitigate transmission in our community.  Due to her co-morbid illnesses, this patient is at least at moderate risk for complications without adequate follow up.  This format is felt to be most appropriate for this patient at this time.  The patient did not have access to video technology/had technical difficulties with video requiring transitioning to audio format only (telephone).  All issues noted in this document were discussed and addressed.  No physical exam could be performed with this format.  Please refer to the patient's chart for her  consent to telehealth for [facility].   Date:  07/25/2019   ID:  Michele Green, DOB 1941/09/24, MRN [id number]  Patient Location: Home Provider Location: Home  PCP:  [name], MD  Cardiologist:  Sinclair Grooms, MD  Electrophysiologist:  Constance Haw, MD   Evaluation Performed:  Follow-Up Visit  Chief Complaint:  MVR/AF/CAD  History of Present Illness:    Michele Green is a 78 y.o. female with with a hx of  CAD s/p CABG xi [place]--> LAD and severeMR s/p repairSeptember 2019, breast cancer, HTN, history of atrial fibrillation prior to open heart surgery, Maze procedure with out atrial appendage ligation September 2019, recurrent A. fib and a flutter post surgery despite amiodarone therapy and cardioversion November 2019.  She is doing well.  She has refused to come into the office doing the pandemic.  Her breathing is excellent.  She has had no palpitations to suggest atrial fibrillation.  She is having no lower extremity swelling.  No transient neurological complaints.  She is now 1 year out from complex open heart surgery including LIMA to LAD and mitral valve repair.  The patient does not have symptoms  concerning for COVID-19 infection (fever, chills, cough, or new shortness of breath).    Past Medical History:  Diagnosis Date  . Arrhythmia   . Atypical atrial flutter ([place]) 08/14/2018   Post-operative  . Breast cancer of upper-outer quadrant of left female breast ([place]) 02/02/2016  . CAD (coronary artery disease) 06/20/2018   LHC 7/19: pLAD 65/90, oD1 90, mLCx 85, OM2 50, oRCA 30, EF 50-55 >> s/p CABG in 9/19 (L-LAD)  . Colon polyp 02/2012  . Dupuytren contracture    bilateral hands  . Family history of breast cancer   . Hyperlipidemia   . Hypertension   . Mitral regurgitation 11/07/2013   Echo 7/19: Mild LVH, EF 60-65, no RWMA, mod ot severe MR, massive LAE, PASP 33 // TEE 7/19:  Mild conc LVH, EF 60-65, no RWMA, severe MR with mild post leaflet prolapse, massive // s/p MV repair 07/2018  . On continuous oral anticoagulation 08/22/2015   Started on Xarelto 08/12/2015   . Osteopenia   . Persistent atrial fibrillation 08/22/2015   Started late August or early September 2016 // s/p Maze procedure 07/2018  . Personal history of radiation therapy   . Radiation Therapy 04/21/16-05/19/16   left breast 47.72 Gy, boosted to 10 Gy  . 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 S/P Maze operation for atrial fibrillation 08/10/2018   Complete bilateral atrial lesion set using bipolar radiofrequency and cryothermy ablation with clipping of LA appendage  . S/P MVR (mitral valve repair) 08/10/2018   Complex valvuloplasty including Gore-tex neochord  placement x8, Plication of Lateral Commissure and 54m Sorin Memo 4D Ring Annuloplasty SN# [id number] . [place]cerative [place]   . Wears glasses    Past Surgical History:  Procedure Laterality Date  . BREAST BIOPSY Left 01/28/2016  . BREAST LUMPECTOMY Left 02/23/2016  . BREAST LUMPECTOMY WITH RADIOACTIVE SEED LOCALIZATION Left 02/23/2016   Procedure: BREAST LUMPECTOMY WITH RADIOACTIVE SEED LOCALIZATION;  Surgeon: BExcell Seltzer MD;  [place]  Service: General;  Laterality: Left;  . CARDIAC CATHETERIZATION    . CARDIOVERSION N/A 10/02/2015   Procedure: CARDIOVERSION;  Surgeon: MJerline Pain MD;  [facility];  Service: Cardiovascular;  Laterality: N/A;  . CARDIOVERSION N/A 10/05/2018   Procedure: CARDIOVERSION;  Surgeon: RFay Records MD;  [place]  Service: Cardiovascular;  Laterality: N/A;  . CLIPPING OF ATRIAL APPENDAGE  08/10/2018   Procedure: CLIPPING OF ATRIAL APPENDAGE;  Surgeon: ORexene Alberts MD;  [place]  Service: Open Heart Surgery;;  . COLONOSCOPY    . CORONARY ARTERY BYPASS GRAFT N/A 08/10/2018   Procedure: CORONARY ARTERY BYPASS GRAFTING (CABG) x 1, LIMA-LAD,  USING LEFT INTERNAL MAMMARY ARTERY. HARVESTED RIGHT GREATER SAPHENOUS VEIN ENDOSCOPICALLY;  Surgeon: ORexene Alberts MD;  [place]  Service: Open Heart Surgery;  Laterality: N/A;  . CYSTOSCOPY W/ RETROGRADES Bilateral 06/20/2019   Procedure: CYSTOSCOPY WITH RETROGRADE PYELOGRAM;  Surgeon: HArdis Hughs MD;  [facility]  Service: [facility];  Laterality: Bilateral;  . CYSTOSCOPY WITH HYDRODISTENSION AND BIOPSY N/A 06/20/2019   Procedure: CYSTOSCOPY BLADDER  BIOPSY WITH FULGERATION;  Surgeon: HArdis Hughs MD;  [facility]  Service: Urology;  Laterality: N/A;  . DUPUYTREN CONTRACTURE RELEASE  2001   leftx2  . [place]  [place]  . DUPUYTREN CONTRACTURE RELEASE Right 05/02/2014   Procedure: EXCISION DUPUYTRENS RIGHT PALMAR/SMALL ;  Surgeon: RCammie Sickle MD;  [place]  Service: Orthopedics;  Laterality: Right;  .Marland KitchenMAZE N/A 08/10/2018   Procedure: MAZE;  Surgeon: ORexene Alberts MD;  [place]  Service: Open Heart Surgery;  Laterality: N/A;  . MITRAL VALVE REPAIR N/A 08/10/2018   Procedure: MITRAL VALVE REPAIR (MVR);  Surgeon: ORexene Alberts MD;  [place]  Service: Open  Heart Surgery;  Laterality: N/A;  glutaraldehyde  . RIGHT/LEFT HEART CATH AND CORONARY ANGIOGRAPHY N/A 06/20/2018   Procedure: RIGHT/LEFT HEART CATH AND CORONARY ANGIOGRAPHY;  Surgeon: SBelva Crome MD;  [place] [facility] [facility];  Service: Cardiovascular;  Laterality: N/A;  . TEE WITHOUT CARDIOVERSION N/A 06/20/2018   Procedure: TRANSESOPHAGEAL ECHOCARDIOGRAM (TEE);  Surgeon: HPixie Casino MD;  [facility];  Service: Cardiovascular;  Laterality: N/A;  . TEE WITHOUT CARDIOVERSION N/A 08/10/2018   Procedure: TRANSESOPHAGEAL ECHOCARDIOGRAM (TEE);  Surgeon: ORexene Alberts MD;  [place]  Service: Open Heart Surgery;  Laterality: N/A;  . TONSILLECTOMY  age 48     No outpatient medications have been marked as taking for the 07/26/19 encounter (Appointment) with SBelva Crome MD.     Allergies:   Shellfish allergy   Social History   Tobacco Use  . Smoking status: Former Smoker    Packs/day: 1.00    Years: 20.00    Pack years: 20.00    Types: Cigarettes    Quit date: 11/22/1992    Years since quitting: 26.6  . Smokeless tobacco: Never Used  Substance Use Topics  .  Alcohol use: Yes    Alcohol/week: 3.0 standard drinks    Types: 3 Standard drinks or equivalent per week    Comment: social  . Drug use: No     Family Hx: The patient's family history includes Breast cancer in her sister; Breast cancer (age of onset: 60) in her mother; Cirrhosis in her mother; Heart attack in her father; Heart failure in her father; Kidney Stones in her sister. There is no history of Osteoporosis.  ROS:   Please see the history of present illness.    She is ambulatory.  She is trying to get some exercise in.  She has a lot of other medical problems including muscle aches and pains.  She has not had dark tarry appearing bowel movements. All other systems reviewed and are negative.   Prior CV studies:   The following studies were reviewed today:  No new functional data   Labs/Other Tests and Data Reviewed:    EKG:  No ECG reviewed.  Recent Labs: 08/16/2018: Magnesium 1.9 01/26/2019: ALT 19; Platelets 124 06/20/2019: BUN 15; Creatinine, Ser 0.70; Hemoglobin 11.6; Potassium 4.0; Sodium 144   Recent Lipid Panel No results found for: CHOL, TRIG, HDL, CHOLHDL, LDLCALC, LDLDIRECT  Wt Readings from Last 3 Encounters:  06/20/19 131 lb 8 oz (59.6 kg)  03/29/19 129 lb (58.5 kg)  01/26/19 126 lb 12.8 oz (57.5 kg)     Objective:    Vital Signs:  There were no vitals taken for this visit.   VITAL SIGNS:  reviewed vital signs were not provided  ASSESSMENT & PLAN:    1. S/P mitral valve repair + CABG x1 + maze procedure   2. Essential hypertension   3. S/P CABG x 1   4. Chronic diastolic HF (heart failure) ([place])   5. Long term (current) use of anticoagulants   6. Persistent atrial fibrillation   7. Educated About Covid-19 Virus Infection     PLAN:  1. Clinically doing well based upon her on assessment.  No shortness of breath.  Exertional tolerance has dramatically improved. 2. Do not know blood pressure today.  She will record some and call us with results. 3. No symptoms of angina.  Secondary prevention discussed. 4. No symptoms or clinical findings to suggest volume overload. 5. We have decided to stop aspirin.  Continue Xarelto, low-dose 50 mg daily. 6. Atrial fibrillation has not recurred since maze procedure and left atrial appendage ligation followed by electrical cardioversion postop on amiodarone.  Amiodarone has since been discontinued 3 months ago without recurrence. 7. Educated concerning COVID-19 prevention.  Masking, washing, and waiting.  COVID-19 Education: The signs and symptoms of COVID-19 were discussed with the patient and how to seek care for testing (follow up with PCP or arrange E-visit).  The importance of social distancing was discussed today.  Time:   Today, I have spent 15 minutes with the patient with telehealth technology  discussing the above problems.     Medication Adjustments/Labs and Tests Ordered: Current medicines are reviewed at length with the patient today.  Concerns regarding medicines are outlined above.   Tests Ordered: No orders of the defined types were placed in this encounter.   Medication Changes: No orders of the defined types were placed in this encounter.   Follow Up:  In Person in 6 month(s)  Signed, Sinclair Grooms, MD  07/25/2019 7:47 PM    [place]

## 2019-07-25 NOTE — Telephone Encounter (Signed)
Called pt and went over meds for appt 07/26/19

## 2019-07-26 ENCOUNTER — Other Ambulatory Visit: Payer: Self-pay

## 2019-07-26 ENCOUNTER — Encounter: Payer: Self-pay | Admitting: Interventional Cardiology

## 2019-07-26 ENCOUNTER — Telehealth (INDEPENDENT_AMBULATORY_CARE_PROVIDER_SITE_OTHER): Payer: Medicare Other | Admitting: Interventional Cardiology

## 2019-07-26 VITALS — Ht 64.0 in | Wt 126.0 lb

## 2019-07-26 DIAGNOSIS — I4819 Other persistent atrial fibrillation: Secondary | ICD-10-CM

## 2019-07-26 DIAGNOSIS — Z9889 Other specified postprocedural states: Secondary | ICD-10-CM | POA: Diagnosis not present

## 2019-07-26 DIAGNOSIS — Z951 Presence of aortocoronary bypass graft: Secondary | ICD-10-CM

## 2019-07-26 DIAGNOSIS — I5032 Chronic diastolic (congestive) heart failure: Secondary | ICD-10-CM

## 2019-07-26 DIAGNOSIS — I10 Essential (primary) hypertension: Secondary | ICD-10-CM

## 2019-07-26 DIAGNOSIS — Z7901 Long term (current) use of anticoagulants: Secondary | ICD-10-CM

## 2019-07-26 DIAGNOSIS — Z7189 Other specified counseling: Secondary | ICD-10-CM

## 2019-07-26 NOTE — Patient Instructions (Signed)
Medication Instructions:  1) DISCONTINUE Aspirin  If you need a refill on your cardiac medications before your next appointment, please call your pharmacy.   Lab work: none If you have labs (blood work) drawn today and your tests are completely normal, you will receive your results only by: [name] MyChart Message (if you have MyChart) OR . A paper copy in the mail If you have any lab test that is abnormal or we need to change your treatment, we will call you to review the results.  Testing/Procedures: None  Follow-Up: At [facility], you and your health needs are our priority.  As part of our continuing mission to provide you with exceptional heart care, we have created designated Provider Care Teams.  These Care Teams include your primary Cardiologist (physician) and Advanced Practice Providers (APPs -  Physician Assistants and Nurse Practitioners) who all work together to provide you with the care you need, when you need it. You will need a follow up appointment in 6 months.  Please call our office 2 months in advance to schedule this appointment.  You may see Sinclair Grooms, MD or one of the following Advanced Practice Providers on your designated Care Team:   Truitt Merle, NP Cecilie Kicks, NP . Kathyrn Drown, NP  Any Other Special Instructions Will Be Listed Below (If Applicable).

## 2019-08-01 ENCOUNTER — Other Ambulatory Visit: Payer: Self-pay | Admitting: Nurse Practitioner

## 2019-08-01 ENCOUNTER — Encounter: Payer: Self-pay | Admitting: Nurse Practitioner

## 2019-08-01 ENCOUNTER — Inpatient Hospital Stay: Payer: Medicare Other

## 2019-08-01 ENCOUNTER — Other Ambulatory Visit: Payer: Self-pay

## 2019-08-01 ENCOUNTER — Inpatient Hospital Stay: Payer: Medicare Other | Attending: Nurse Practitioner | Admitting: Nurse Practitioner

## 2019-08-01 ENCOUNTER — Telehealth: Payer: Self-pay | Admitting: Nurse Practitioner

## 2019-08-01 VITALS — BP 110/83 | HR 100 | Temp 98.5°F | Resp 18 | Ht 64.0 in | Wt 127.2 lb

## 2019-08-01 DIAGNOSIS — C50412 Malignant neoplasm of upper-outer quadrant of left female breast: Secondary | ICD-10-CM | POA: Insufficient documentation

## 2019-08-01 DIAGNOSIS — Z7901 Long term (current) use of anticoagulants: Secondary | ICD-10-CM | POA: Diagnosis not present

## 2019-08-01 DIAGNOSIS — N951 Menopausal and female climacteric states: Secondary | ICD-10-CM | POA: Insufficient documentation

## 2019-08-01 DIAGNOSIS — I119 Hypertensive heart disease without heart failure: Secondary | ICD-10-CM | POA: Insufficient documentation

## 2019-08-01 DIAGNOSIS — M810 Age-related osteoporosis without current pathological fracture: Secondary | ICD-10-CM | POA: Diagnosis not present

## 2019-08-01 DIAGNOSIS — I4891 Unspecified atrial fibrillation: Secondary | ICD-10-CM | POA: Diagnosis not present

## 2019-08-01 DIAGNOSIS — Z170 Estrogen receptor positive status [ER+]: Secondary | ICD-10-CM

## 2019-08-01 DIAGNOSIS — I25810 Atherosclerosis of coronary artery bypass graft(s) without angina pectoris: Secondary | ICD-10-CM | POA: Insufficient documentation

## 2019-08-01 DIAGNOSIS — D7589 Other specified diseases of blood and blood-forming organs: Secondary | ICD-10-CM

## 2019-08-01 DIAGNOSIS — E538 Deficiency of other specified B group vitamins: Secondary | ICD-10-CM

## 2019-08-01 LAB — CBC WITH DIFFERENTIAL/PLATELET
Abs Immature Granulocytes: 0.01 10*3/uL (ref 0.00–0.07)
Basophils Absolute: 0.1 10*3/uL (ref 0.0–0.1)
Basophils Relative: 1 %
Eosinophils Absolute: 0.4 10*3/uL (ref 0.0–0.5)
Eosinophils Relative: 6 %
HCT: 34.6 % — ABNORMAL LOW (ref 36.0–46.0)
Hemoglobin: 11.6 g/dL — ABNORMAL LOW (ref 12.0–15.0)
Immature Granulocytes: 0 %
Lymphocytes Relative: 12 %
Lymphs Abs: 0.7 10*3/uL (ref 0.7–4.0)
MCH: 37.2 pg — ABNORMAL HIGH (ref 26.0–34.0)
MCHC: 33.5 g/dL (ref 30.0–36.0)
MCV: 110.9 fL — ABNORMAL HIGH (ref 80.0–100.0)
Monocytes Absolute: 0.5 10*3/uL (ref 0.1–1.0)
Monocytes Relative: 8 %
Neutro Abs: 4.5 10*3/uL (ref 1.7–7.7)
Neutrophils Relative %: 73 %
Platelets: 138 10*3/uL — ABNORMAL LOW (ref 150–400)
RBC: 3.12 MIL/uL — ABNORMAL LOW (ref 3.87–5.11)
RDW: 12.6 % (ref 11.5–15.5)
WBC: 6.2 10*3/uL (ref 4.0–10.5)
nRBC: 0 % (ref 0.0–0.2)

## 2019-08-01 LAB — CMP (CANCER CENTER ONLY)
ALT: 10 U/L (ref 0–44)
AST: 19 U/L (ref 15–41)
Albumin: 3.5 g/dL (ref 3.5–5.0)
Alkaline Phosphatase: 57 U/L (ref 38–126)
Anion gap: 9 (ref 5–15)
BUN: 11 mg/dL (ref 8–23)
CO2: 24 mmol/L (ref 22–32)
Calcium: 8 mg/dL — ABNORMAL LOW (ref 8.9–10.3)
Chloride: 106 mmol/L (ref 98–111)
Creatinine: 0.92 mg/dL (ref 0.44–1.00)
GFR, Est AFR Am: >60 mL/min (ref >60)
GFR, Estimated: 59 mL/min — ABNORMAL LOW (ref >60)
Glucose, Bld: 91 mg/dL (ref 70–99)
Potassium: 3.8 mmol/L (ref 3.5–5.1)
Sodium: 139 mmol/L (ref 135–145)
Total Bilirubin: 0.6 mg/dL (ref 0.3–1.2)
Total Protein: 6.6 g/dL (ref 6.5–8.1)

## 2019-08-01 LAB — VITAMIN B12: Vitamin B-12: 629 pg/mL (ref 180–914)

## 2019-08-01 NOTE — Telephone Encounter (Signed)
Scheduled appt per 9/9 los - gave patient AVS and calender per los.

## 2019-08-01 NOTE — Progress Notes (Signed)
Michele Green   [phone] [phone]   Clinic Follow up Note   Patient Care Team: [name], MD as PCP - General (Family Medicine) [name], MD as PCP - Cardiology (Cardiology) Constance Haw, MD as PCP - Electrophysiology (Cardiology) 08/01/2019  CHIEF COMPLAINT: f/u left breast cancer    SUMMARY OF ONCOLOGIC HISTORY: Oncology History Overview Note  Breast cancer of upper-outer quadrant of left female breast [facility])   Staging form: Breast, AJCC 7th Edition   - Clinical stage from 01/28/2016: Stage IA (T1a, N0, M0) - Signed by Truitt Merle, MD on 02/03/2016   - Pathologic stage from 02/23/2016: Stage IA (T1b, N0, cM0) - Signed by Truitt Merle, MD on 06/14/2016      Breast cancer of upper-outer quadrant of left female breast ([place])  01/14/2016 Mammogram   Screening mammogram showed possible mass with distortion in the left breast.   01/23/2016 Imaging   Diagnostic mammogram and ultrasound of the left breast showed a 3-4 mm shadowing mass at 1:00 in the left breast, no adenopathy.   01/28/2016 Initial Diagnosis   Breast cancer of upper-outer quadrant of left female breast ([place])   01/28/2016 Initial Biopsy   Left breast mass biopsy showed invasive ductal carcinoma, grade 1.   01/28/2016 Receptors her2   Your 100% positive, PR 100% positive, HER-2 negative, Ki-67 5%   02/23/2016 Surgery   Left breast lumpectomy, and sentinel lymph node biopsy   02/23/2016 Pathology Results   Left breast lumpectomy showed a 1.0 cm invasive ductal carcinoma, grade 1, no lymphvascular invasion, invasive carcinoma is broadly less than 0.1 cm to the anterior inferior margin, ADH, ALH, 1 node(-). Left inferior reexcision margin showed LCIS    04/21/2016 - 05/19/2016 Radiation Therapy   adjuvant breast radiation    06/10/2016 -  Anti-estrogen oral therapy   Anastrozole 1 mg daily.  Changed to Tamoxifen due to osteoporosis on 09/30/17, which was held in Sep 2019 for her open  heart surgery and restarted in 10/2018     02/03/2017 Mammogram   Bilateral diagnostic mammogram 02/03/17 IMPRESSION: Expected surgical changes in the upper-outer quadrant of the left breast. No mammographic evidence of malignancy in the bilateral breasts.   02/20/2018 Mammogram   02/20/2018 Mammogram IMPRESSION: No evidence of malignancy within either breast. Stable postsurgical changes within the left breast.   03/09/2019 Mammogram   IMPRESSION: No mammographic evidence of malignancy.   RECOMMENDATION: Annual diagnostic mammography.     CURRENT THERAPY: CURRENT THERAPY:  1. Anastrozole 85m daily, started in 05/2016, changed to Tamoxifen due to osteoporosis on 09/30/17, which was held in Sep 2019 for her open heart surgeryand restarted in 10/2018 2. Prolia injections every 6 months with Dr. sBrigitte Pulse INTERVAL HISTORY: Ms. SHaussreturns for f/u as scheduled. She was last seen by Dr. FBurr Medicoin 01/2019. She has ongoing urinary issues, recently had a bladder biopsy that was negative. She gets periodic "bladder injections" and has bleeding/discharge after that. This is not abnormal for her. She continues tamoxifen. Tolerates well with mild hot flashes. Denies bone or joint pain. Appetite is normal for her. Denies change in bowel habits. Denies changes or concerns in her breast such as new lump, nipple discharge, or inversion. She is on prolia per Dr. SBrigitte Pulse She takes folic acid and BU20daily. She is not on calcium.    MEDICAL HISTORY:  Past Medical History:  Diagnosis Date  . Arrhythmia   . Atypical atrial flutter ([place] 08/14/2018  Post-operative  . Breast cancer of upper-outer quadrant of left female breast ([place]) 02/02/2016  . CAD (coronary artery disease) 06/20/2018   LHC 7/19: pLAD 65/90, oD1 90, mLCx 85, OM2 50, oRCA 30, EF 50-55 >> s/p CABG in 9/19 (L-LAD)  . Colon polyp 02/2012  . Dupuytren contracture    bilateral hands  . Family history of breast cancer   . Hyperlipidemia   .  Hypertension   . Mitral regurgitation 11/07/2013   Echo 7/19: Mild LVH, EF 60-65, no RWMA, mod ot severe MR, massive LAE, PASP 33 // TEE 7/19:  Mild conc LVH, EF 60-65, no RWMA, severe MR with mild post leaflet prolapse, massive // s/p MV repair 07/2018  . On continuous oral anticoagulation 08/22/2015   Started on Xarelto 08/12/2015   . Osteopenia   . Persistent atrial fibrillation 08/22/2015   Started late August or early September 2016 // s/p Maze procedure 07/2018  . Personal history of radiation therapy   . Radiation Therapy 04/21/16-05/19/16   left breast 47.72 Gy, boosted to 10 Gy  . 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 S/P Maze operation for atrial fibrillation 08/10/2018   Complete bilateral atrial lesion set using bipolar radiofrequency and cryothermy ablation with clipping of LA appendage  . S/P MVR (mitral valve repair) 08/10/2018   Complex valvuloplasty including Gore-tex neochord placement x8, Plication of Lateral Commissure and 34m Sorin Memo 4D Ring Annuloplasty SN# [id number] . Ulcerative colitis ([place]   . Wears glasses     SURGICAL HISTORY: Past Surgical History:  Procedure Laterality Date  . BREAST BIOPSY Left 01/28/2016  . BREAST LUMPECTOMY Left 02/23/2016  . BREAST LUMPECTOMY WITH RADIOACTIVE SEED LOCALIZATION Left 02/23/2016   Procedure: BREAST LUMPECTOMY WITH RADIOACTIVE SEED LOCALIZATION;  Surgeon: BExcell Seltzer MD;  [place]  Service: General;  Laterality: Left;  . CARDIAC CATHETERIZATION    . CARDIOVERSION N/A 10/02/2015   Procedure: CARDIOVERSION;  Surgeon: MJerline Pain MD;  [facility]  Service: Cardiovascular;  Laterality: N/A;  . CARDIOVERSION N/A 10/05/2018   Procedure: CARDIOVERSION;  Surgeon: RFay Records MD;  [place]  Service: Cardiovascular;  Laterality: N/A;  . CLIPPING OF ATRIAL APPENDAGE  08/10/2018   Procedure: CLIPPING OF ATRIAL APPENDAGE;  Surgeon: ORexene Alberts MD;  [place]   Service: Open Heart Surgery;;  . COLONOSCOPY    . CORONARY ARTERY BYPASS GRAFT N/A 08/10/2018   Procedure: CORONARY ARTERY BYPASS GRAFTING (CABG) x 1, LIMA-LAD,  USING LEFT INTERNAL MAMMARY ARTERY. HARVESTED RIGHT GREATER SAPHENOUS VEIN ENDOSCOPICALLY;  Surgeon: ORexene Alberts MD;  [place]  Service: Open Heart Surgery;  Laterality: N/A;  . CYSTOSCOPY W/ RETROGRADES Bilateral 06/20/2019   Procedure: CYSTOSCOPY WITH RETROGRADE PYELOGRAM;  Surgeon: HArdis Hughs MD;  [facility]  Service: Urology;  Laterality: Bilateral;  . CYSTOSCOPY WITH HYDRODISTENSION AND BIOPSY N/A 06/20/2019   Procedure: CYSTOSCOPY BLADDER  BIOPSY WITH FULGERATION;  Surgeon: HArdis Hughs MD;  [facility]  Service: Urology;  Laterality: N/A;  . DUPUYTREN CONTRACTURE RELEASE  2001   leftx2  . DDaisy  right  . DUPUYTREN CONTRACTURE RELEASE Right 05/02/2014   Procedure: EXCISION DUPUYTRENS RIGHT PALMAR/SMALL ;  Surgeon: RCammie Sickle MD;  [place]  Service: Orthopedics;  Laterality: Right;  .Marland KitchenMAZE N/A 08/10/2018   Procedure: MAZE;  Surgeon: ORexene Alberts MD;  [facility];  Service: Open Heart Surgery;  Laterality: N/A;  . MITRAL VALVE REPAIR N/A 08/10/2018   Procedure: MITRAL VALVE REPAIR (MVR);  Surgeon: Rexene Alberts, MD;  Location: [place];  Service: Open Heart Surgery;  Laterality: N/A;  glutaraldehyde  . RIGHT/LEFT HEART CATH AND CORONARY ANGIOGRAPHY N/A 06/20/2018   Procedure: RIGHT/LEFT HEART CATH AND CORONARY ANGIOGRAPHY;  Surgeon: [name], MD;  Location: [place] CV [facility];  Service: Cardiovascular;  Laterality: N/A;  . TEE WITHOUT CARDIOVERSION N/A 06/20/2018   Procedure: TRANSESOPHAGEAL ECHOCARDIOGRAM (TEE);  Surgeon: Pixie Casino, MD;  [facility];  Service: Cardiovascular;  Laterality: N/A;  . TEE WITHOUT CARDIOVERSION N/A 08/10/2018   Procedure:  TRANSESOPHAGEAL ECHOCARDIOGRAM (TEE);  Surgeon: Rexene Alberts, MD;  Location: [place];  Service: Open Heart Surgery;  Laterality: N/A;  . TONSILLECTOMY  age 70    I have reviewed the social history and family history with the patient and they are unchanged from previous note.  ALLERGIES:  is allergic to shellfish allergy.  MEDICATIONS:  Current Outpatient Medications  Medication Sig Dispense Refill  . acetaminophen (TYLENOL) 325 MG tablet Take 2 tablets (650 mg total) by mouth every 6 (six) hours as needed for mild pain or fever.    [name] allopurinol (ZYLOPRIM) 100 MG tablet Take 200 mg by mouth every evening.     . furosemide (LASIX) 20 MG tablet Take 20 mg by mouth daily as needed (for swelling/fluid retention.).     [name] KLOR-CON M20 20 MEQ tablet TAKE 1 TABLET BY MOUTH EVERY DAY 90 tablet 3  . mesalamine (APRISO) 0.375 g 24 hr capsule     . metoprolol tartrate (LOPRESSOR) 100 MG tablet Take 1 tablet (100 mg total) by mouth 2 (two) times daily. 180 tablet 3  . phenazopyridine (PYRIDIUM) 200 MG tablet Take 1 tablet (200 mg total) by mouth 3 (three) times daily as needed for pain. 10 tablet 0  . simvastatin (ZOCOR) 20 MG tablet Take 20 mg by mouth every evening.     . tamoxifen (NOLVADEX) 20 MG tablet Take 1 tablet (20 mg total) by mouth daily. 90 tablet 3  . traMADol (ULTRAM) 50 MG tablet Take 1-2 tablets (50-100 mg total) by mouth every 6 (six) hours as needed for moderate pain. 15 tablet 0  . XARELTO 15 MG TABS tablet TAKE 1 TABLET BY MOUTH  DAILY WITH SUPPER 90 tablet 2   No current facility-administered medications for this visit.     PHYSICAL EXAMINATION: ECOG PERFORMANCE STATUS: 1 - Symptomatic but completely ambulatory  Vitals:   08/01/19 1156  BP: 110/83  Pulse: 100  Resp: 18  Temp: 98.5 F (36.9 C)  SpO2: 99%   Filed Weights   08/01/19 1156  Weight: 127 lb 3.2 oz (57.7 kg)    GENERAL:alert, no distress and comfortable SKIN: no rash. Generalized bruising to arms EYES:  sclera clear LYMPH:  no palpable cervical or supraclavicular lymphadenopathy LUNGS: clear to auscultation with normal breathing effort HEART: regular rate & rhythm, no lower extremity edema NEURO: alert & oriented x 3 with fluent speech, normal gait Breast exam: s/p left lumpectomy. Incision is completely healed. No skin hyperpigmentation. No palpable mass in either breast or axilla that I could appreciate  LABORATORY DATA:  I have reviewed the data as listed CBC Latest Ref Rng & Units 08/01/2019 06/20/2019 01/26/2019  WBC 4.0 - 10.5 K/uL 6.2 - 3.5(L)  Hemoglobin 12.0 - 15.0 g/dL 11.6(L) 11.6(L) 12.6  Hematocrit 36.0 -  46.0 % 34.6(L) 34.0(L) 38.2  Platelets 150 - 400 K/uL 138(L) - 124(L)     CMP Latest Ref Rng & Units 08/01/2019 06/20/2019 01/26/2019  Glucose 70 - 99 mg/dL 91 84 91  BUN 8 - 23 mg/dL _0 Creatinine 0.44 - 1.00 mg/dL 0.92 0.70 0.84  Sodium 135 - 145 mmol/L 139 144 141  Potassium 3.5 - 5.1 mmol/L 3.8 4.0 4.3  Chloride 98 - 111 mmol/L 106 107 108  CO2 22 - 32 mmol/L 24 - 24  Calcium 8.9 - 10.3 mg/dL 8.0(L) - 8.7(L)  Total Protein 6.5 - 8.1 g/dL 6.6 - 7.2  Total Bilirubin 0.3 - 1.2 mg/dL 0.6 - 1.0  Alkaline Phos 38 - 126 U/L 57 - 54  AST 15 - 41 U/L 19 - 29  ALT 0 - 44 U/L 10 - 19      RADIOGRAPHIC STUDIES: I have personally reviewed the radiological images as listed and agreed with the findings in the report. No results found.   ASSESSMENT & PLAN: Michele DABBS is a 78 y.o. female with   1. Breast cancer of upper-outer quadrant of left female breast, G1 invasive ductal carcinoma, pT1bN0M0, stage IA, ER+/PR+/HER2- -Diagnosed in 01/2016. S/p left breast lumpectomy and adjuvant radiation.  -She started anti-estrogen therapy with anastrozole in 05/2016. Due to osteoporosis she was switched to Tamoxifen therapy. Tolerable with mild hot flashes. Plan to complete in summer 2022.  -mammogram in 02/2019 is negative for malignancy  -Ms. Pevey is clinically doing well  today. Breast exam is unremarkable. Labs stable. No clinical concern for recurrence. Continue breast cancer surveillance and tamoxifen -CT in 05/2019 ordered by GYN for urinary issues showed a thickened endometrial stripe. She reports bleeding/discharge after bladder injections which is not abnormal for her.  -given the risk of endometrial cancer on tamoxifen, I sent a message to Dr. Julien Girt to determine if she needs further gyn f/u. Awaiting response.  -she will return for lab and f/u with Dr. Burr Medico in 6 months   2. Osteoporosis  -She had spinal fracture after a mild fall in 2018, she recovered well. -07/15/17 bone density shows osteoporosis with the lowest T-Score in the left femur neck, -3.3, which has definitely gotten worse since 2014, when her T score was -2.2 -continue Prolia injections per Dr. Brigitte Pulse.  -She has not started calcium or vitamin D supplement, I encouraged her to do so -she is not interested in repeating DEXA at this time, she will f/u with Dr. Brigitte Pulse   3. HTN,CAD,AF, mitral regurgitation  -F/u with her primary care physician and cardiologist. -She iss/p CABGx1,mitral valve repair and maze operation for atrial fibrillationon 08/10/2018.  -on Xarelto, f/u with PCP, cardiology   4. Genetics testing was negative  5. Macrocytic anemia  -Developed anemia after her open heart surgery, and had macrocytosis even before her open heart surgery -she is on I77 and folic acid supplements -Hgb 11.6 today, B12 in normal range (629); intrinsic factor, folate, MMA pending  -will f/u on outstanding labs   PLAN: -Labs, mammogram reviewed -folate, MMA, intrinsic factor pending, will f/u -Breast exam benign -Continue surveillance, tamoxifen -F/u with PCP and other providers  -Message to Dr. Julien Girt re: CT findings  All questions were answered. The patient knows to call the clinic with any problems, questions or concerns. No barriers to learning was detected.     Alla Feeling, NP  08/01/19

## 2019-08-02 LAB — FOLATE RBC
Folate, Hemolysate: >620 ng/mL
Folate, RBC: >1792 ng/mL (ref >498)
Hematocrit: 34.6 % (ref 34.0–46.6)

## 2019-08-02 LAB — INTRINSIC FACTOR ANTIBODIES: Intrinsic Factor: 1 AU/mL (ref 0.0–1.1)

## 2019-08-03 LAB — METHYLMALONIC ACID, SERUM: Methylmalonic Acid, Quantitative: 117 nmol/L (ref 0–378)

## 2019-08-06 ENCOUNTER — Telehealth: Payer: Self-pay | Admitting: *Deleted

## 2019-08-06 NOTE — Telephone Encounter (Signed)
-----   Message from Alla Feeling, NP sent at 08/06/2019 10:17 AM EDT ----- Please let her know J53 and folic acid are in normal range. Encourage her to take calcium supplement once daily as I mentioned in her visit. Will continue monitoring.  Thanks, Regan Rakers

## 2019-08-06 NOTE — Telephone Encounter (Signed)
Per Cira Rue, NP, called pt about normal K81 and folic acid results. Also advised to take calcium supplement x1 daily. Pt verbalized understanding.

## 2019-08-06 NOTE — Progress Notes (Signed)
Awaiting callback from pt.

## 2019-08-20 ENCOUNTER — Telehealth: Payer: Self-pay

## 2019-08-20 ENCOUNTER — Ambulatory Visit: Payer: Medicare Other | Admitting: Thoracic Surgery (Cardiothoracic Vascular Surgery)

## 2019-08-20 NOTE — Telephone Encounter (Signed)
Michele Green latest office visit to Dr. Julien Girt office [phone] asking Dr. Julien Girt to do additional GYN work up since patient's most recent CT scan showed a thickened endometrial stripe (and patient is currently on Tamoxifen). Received confirmation that fax went through successfully. Will continue to support as needed.

## 2019-09-03 ENCOUNTER — Other Ambulatory Visit: Payer: Self-pay

## 2019-09-03 ENCOUNTER — Ambulatory Visit: Payer: Medicare Other | Admitting: Thoracic Surgery (Cardiothoracic Vascular Surgery)

## 2019-09-03 ENCOUNTER — Encounter: Payer: Self-pay | Admitting: Thoracic Surgery (Cardiothoracic Vascular Surgery)

## 2019-09-03 ENCOUNTER — Other Ambulatory Visit: Payer: Self-pay | Admitting: Thoracic Surgery (Cardiothoracic Vascular Surgery)

## 2019-09-03 VITALS — BP 106/74 | HR 121 | Temp 97.0°F | Resp 16 | Ht 64.0 in | Wt 127.0 lb

## 2019-09-03 DIAGNOSIS — Z8679 Personal history of other diseases of the circulatory system: Secondary | ICD-10-CM | POA: Diagnosis not present

## 2019-09-03 DIAGNOSIS — Z951 Presence of aortocoronary bypass graft: Secondary | ICD-10-CM | POA: Diagnosis not present

## 2019-09-03 DIAGNOSIS — Z9889 Other specified postprocedural states: Secondary | ICD-10-CM

## 2019-09-03 NOTE — Patient Instructions (Signed)
Continue all previous medications without any changes at this time  Check your pulse and blood pressure on a regular basis and keep a log for your records.  Discussed with your cardiologist and/or primary care physician whether or not your blood pressure medications should be adjusted.

## 2019-09-03 NOTE — Progress Notes (Signed)
[address]       [place] [address]             [phone]     CARDIOTHORACIC SURGERY OFFICE NOTE  Primary Cardiologist is Sinclair Grooms, MD  Primary Electrophysiologist is Will Alesia Morin, MD PCP is [name], MD   HPI:  Patient is a 78 year old female with history of mitral regurgitation, long-standing persistent atrial fibrillation on oral anticoagulation, hypertension, chronic diastolic congestive heart failure, previous spine surgery for management ofcompression fracture of the lumbar spine, and remote history of breast cancer whoreturns to the office today forroutine follow-up approximately 1 year status post mitral valve repair, coronary artery bypass grafting x1, and Maze procedure on August 10, 2018.  Follow-up echocardiogram performed October 03, 2018 revealed normal left ventricular systolic function with ejection fraction estimated 60 to 65%.  Mitral valve repair appeared intact with "trivial" residual mitral regurgitation.  Mean transvalvular gradient across the mitral valve was estimated 5 mmHg.  She was last seen here in our office on November 20, 2018 at which time she was doing well.  She was seen by Dr. Curt Bears last January and notably in atrial flutter at that time.  She has remained on chronic anticoagulation using Xarelto and had a virtual telemedicine follow-up visit with [name] 1 month ago at which time the patient reported feeling quite well.  Patient returns for office today and reports that from a cardiac standpoint she continues to do well.  She has had problems with chronic cystitis and recurrent bladder infections.  She denies any symptoms of tachycardia, palpitations, chest discomfort, or shortness of breath.  She is fairly active physically and she specifically denies any symptoms of exertional shortness of breath.  Overall she states that she feels "much improved" in comparison with how she felt prior to her surgery.  She  has not had problems with long-term anticoagulation and she has been compliant with her medications.   Current Outpatient Medications  Medication Sig Dispense Refill  . acetaminophen (TYLENOL) 325 MG tablet Take 2 tablets (650 mg total) by mouth every 6 (six) hours as needed for mild pain or fever.    [name] allopurinol (ZYLOPRIM) 100 MG tablet Take 200 mg by mouth every evening.     . furosemide (LASIX) 20 MG tablet Take 20 mg by mouth daily as needed (for swelling/fluid retention.).     [name] KLOR-CON M20 20 MEQ tablet TAKE 1 TABLET BY MOUTH EVERY DAY 90 tablet 3  . mesalamine (APRISO) 0.375 g 24 hr capsule Take 4 g by mouth daily.     . metoprolol tartrate (LOPRESSOR) 100 MG tablet Take 1 tablet (100 mg total) by mouth 2 (two) times daily. 180 tablet 3  . phenazopyridine (PYRIDIUM) 200 MG tablet Take 1 tablet (200 mg total) by mouth 3 (three) times daily as needed for pain. 10 tablet 0  . simvastatin (ZOCOR) 20 MG tablet Take 20 mg by mouth every evening.     . tamoxifen (NOLVADEX) 20 MG tablet Take 1 tablet (20 mg total) by mouth daily. 90 tablet 3  . traMADol (ULTRAM) 50 MG tablet Take 1-2 tablets (50-100 mg total) by mouth every 6 (six) hours as needed for moderate pain. 15 tablet 0  . XARELTO 15 MG TABS tablet TAKE 1 TABLET BY MOUTH  DAILY WITH SUPPER 90 tablet 2   No current facility-administered medications for this visit.       Physical Exam:  BP 106/74 (BP Location: Right Arm, Patient Position: Sitting, Cuff Size: Normal)   Pulse (!) 121   Temp (!) 97 F (36.1 C)   Resp 16   Ht 5' 4"  (1.626 m)   Wt 127 lb (57.6 kg)   SpO2 96% Comment: ON RA  BMI 21.80 kg/m   General:  Well appearing  Chest:   Clear to auscultation  CV:   Tachycardic without murmur  Incisions:  Completely healed  Abdomen:  Soft nontender  Extremities:  Warm and well-perfused  Diagnostic Tests:  2 channel telemetry rhythm strip demonstrates what appears to be atrial flutter with heart rate 120-130.     Transthoracic Echocardiography  Patient:    Michele Green, Michele Green MR #:       [id number] Study Date: 10/03/2018 Gender:     F Age:        50 Height:     165.1 cm Weight:     54.3 kg BSA:        1.57 m^2 Pt. Status: Room:   ATTENDING    Carylon Perches  REFERRING    Cecilie Kicks R  SONOGRAPHER  Cindy Hazy, RDCS  PERFORMING   [facility], Outpatient  cc:  ------------------------------------------------------------------- LV EF: 60% -   65%  ------------------------------------------------------------------- Indications:      I05.9 Mitral valve disorder.  ------------------------------------------------------------------- History:   PMH:  Acquired from the patient and from the patient&'s chart.  PMH:  Atrial flutter. Coronary artery disease. Atrial fibrillation.  Risk factors:  Hypertension.  ------------------------------------------------------------------- Study Conclusions  - Left ventricle: The cavity size was normal. Systolic function was   normal. The estimated ejection fraction was in the range of 60%   to 65%. Wall motion was normal; there were no regional wall   motion abnormalities. - Aortic valve: There was no significant regurgitation. - Mitral valve: S/P MV repair. An annular ring prosthesis was   present. There was trivial perivalvular and trivial central   regurgitation. Mean gradient (D): 5 mm Hg. - Left atrium: The atrium was massively dilated. - Right ventricle: Systolic function was normal. - Atrial septum: No defect or patent foramen ovale was identified. - Tricuspid valve: There was mild-moderate regurgitation. Peak   RV-RA gradient (S): 37 mm Hg. - Pulmonic valve: There was mild regurgitation. - Pulmonary arteries: Systolic pressure was mildly to moderately   increased. - Pericardium, extracardiac: A trivial pericardial effusion was   identified posterior to the heart.  Impressions:  - S/P mitral valve  annuloplasty with 28 mm Memo 4D ring. Normal LV   EF, trivial MR. Mild-moderate TR with RV-RA gradient of 37 mmHg.   Trivial posterior pericardial effusion.  ------------------------------------------------------------------- Labs, prior tests, procedures, and surgery: Status post Mitral valve replacement-28 mm Sorin Memo 4D Ring Annuloplasty.  Coronary artery bypass grafting.  ------------------------------------------------------------------- Study data:   Study status:  Routine.  Procedure:  The patient reported no pain pre or post test. Transthoracic echocardiography for left ventricular function evaluation, for right ventricular function evaluation, and for assessment of valvular function. Image quality was adequate.  Study completion:  There were no complications.          Transthoracic echocardiography.  M-mode, complete 2D, spectral Doppler, and color Doppler.  Birthdate: Patient birthdate: [birth year].  Age:  Patient is 78 yr old.  Sex: Gender: female.    BMI: 19.9 kg/m^2.  Blood pressure:     120/70 Patient status:  Outpatient.  Study date:  Study date: 10/03/2018. Study time: 04:13 PM.  Location:  [facility]  -------------------------------------------------------------------  ------------------------------------------------------------------- Left ventricle:  The cavity size was normal. Systolic function was normal. The estimated ejection fraction was in the range of 60% to 65%. Wall motion was normal; there were no regional wall motion abnormalities.  ------------------------------------------------------------------- Aortic valve:   Trileaflet; mildly thickened leaflets. Mobility was not restricted.  Doppler:  Transvalvular velocity was within the normal range. There was no stenosis. There was no significant regurgitation.  ------------------------------------------------------------------- Aorta:  Aortic root: The aortic root was normal in size.   ------------------------------------------------------------------- Mitral valve:  S/P MV repair. An annular ring prosthesis was present. Leaflet separation was normal. Mobility was not restricted.  Doppler:  Transvalvular velocity was within the normal range. There was no evidence for stenosis. There was trivial perivalvular and trivial central regurgitation.    Valve area by pressure half-time: 2.5 cm^2. Indexed valve area by pressure half-time: 1.59 cm^2/m^2. Valve area by continuity equation (using LVOT flow): 1.7 cm^2. Indexed valve area by continuity equation (using LVOT flow): 1.08 cm^2/m^2.    Mean gradient (D): 5 mm Hg. Peak gradient (D): 8 mm Hg.  ------------------------------------------------------------------- Left atrium:  The atrium was massively dilated.  ------------------------------------------------------------------- Atrial septum:  No defect or patent foramen ovale was identified.   ------------------------------------------------------------------- Right ventricle:  The cavity size was normal. Wall thickness was normal. Systolic function was normal.  ------------------------------------------------------------------- Pulmonic valve:   Poorly visualized.  The valve appears to be grossly normal.    Doppler:  Transvalvular velocity was within the normal range. There was no evidence for stenosis. There was mild regurgitation.  ------------------------------------------------------------------- Tricuspid valve:   Structurally normal valve.    Doppler: Transvalvular velocity was within the normal range. There was mild-moderate regurgitation.  ------------------------------------------------------------------- Pulmonary artery:   The main pulmonary artery was normal-sized. Systolic pressure was mildly to moderately increased.  ------------------------------------------------------------------- Right atrium:  The atrium was normal in size.   ------------------------------------------------------------------- Pericardium:  A trivial pericardial effusion was identified posterior to the heart.  ------------------------------------------------------------------- Systemic veins: Inferior vena cava: Not well visualized.  ------------------------------------------------------------------- Measurements   Left ventricle                           Value          Reference  LV ID, ED, PLAX chordal          (L)     42    mm       43 - 52  LV ID, ES, PLAX chordal                  25    mm       23 - 38  LV fx shortening, PLAX chordal           40    %        >=29  LV PW thickness, ED                      9     mm       ----------  IVS/LV PW ratio, ED                      1.11           <=1.3  Stroke volume, 2D  56    ml       ----------  Stroke volume/bsa, 2D                    36    ml/m^2   ----------  LV e&', lateral                           10.8  cm/s     ----------  LV E/e&', lateral                         12.87          ----------  LV e&', medial                            7.79  cm/s     ----------  LV E/e&', medial                          17.84          ----------  LV e&', average                           9.3   cm/s     ----------  LV E/e&', average                         14.95          ----------    Ventricular septum                       Value          Reference  IVS thickness, ED                        10    mm       ----------    LVOT                                     Value          Reference  LVOT ID, S                               20    mm       ----------  LVOT area                                3.14  cm^2     ----------  LVOT ID                                  20    mm       ----------  LVOT peak velocity, S                    97.9  cm/s     ----------  LVOT mean velocity, S                    68.2  cm/s     ----------  LVOT VTI, S                              17.7  cm        ----------  Stroke volume (SV), LVOT DP              55.6  ml       ----------  Stroke index (SV/bsa), LVOT DP           35.3  ml/m^2   ----------    Aorta                                    Value          Reference  Aortic root ID, ED                       32    mm       ----------    Left atrium                              Value          Reference  LA ID, A-P, ES                           53    mm       ----------  LA ID/bsa, A-P                   (H)     3.37  cm/m^2   <=2.2  LA volume, S                             88.1  ml       ----------  LA volume/bsa, S                         56    ml/m^2   ----------  LA volume, ES, 1-p A4C                   65.2  ml       ----------  LA volume/bsa, ES, 1-p A4C               41.4  ml/m^2   ----------  LA volume, ES, 1-p A2C                   109   ml       ----------  LA volume/bsa, ES, 1-p A2C               69.3  ml/m^2   ----------    Mitral valve                             Value          Reference  Mitral E-wave peak velocity              139   cm/s     ----------  Mitral mean velocity, D                  104   cm/s     ----------  Mitral deceleration time         (H)     401   ms       150 - 230  Mitral pressure half-time                94    ms       ----------  Mitral mean gradient, D                  5     mm Hg    ----------  Mitral peak gradient, D                  8     mm Hg    ----------  Mitral valve area, PHT, DP               2.5   cm^2     ----------  Mitral valve area/bsa, PHT, DP           1.59  cm^2/m^2 ----------  Mitral valve area, LVOT                  1.7   cm^2     ----------  continuity  Mitral valve area/bsa, LVOT              1.08  cm^2/m^2 ----------  continuity  Mitral annulus VTI, D                    32.7  cm       ----------    Tricuspid valve                          Value          Reference  Tricuspid regurg peak velocity           305   cm/s     ----------  Tricuspid peak RV-RA gradient            37     mm Hg    ----------    Right atrium                             Value          Reference  RA ID, S-I, ES, A4C                      47.7  mm       34 - 49  RA area, ES, A4C                         14.7  cm^2     8.3 - 19.5  RA volume, ES, A/L                       37.6  ml       ----------  RA volume/bsa, ES, A/L                   23.9  ml/m^2   ----------    Right ventricle                          Value          Reference  RV ID, minor axis, ED, A4C base  36    mm       ----------  RV s&', lateral, S                        7.57  cm/s     ----------  Legend: (L)  and  (H)  mark values outside specified reference range.  ------------------------------------------------------------------- Prepared and Electronically Authenticated by  Buford Dresser 2019-11-13T07:40:05   Impression:  Patient appears to remain in atrial flutter with elevated heart rate.  She otherwise appears to be doing quite well and she specifically denies any symptoms of congestive heart failure.  I suspect that she may have an atypical flutter based upon the appearance of her most recent twelve-lead EKG performed last January.  This is not uncommon following Maze procedure and frequently can be refractory to medical therapy.  Given her elevated heart rate I remain concerned about the possibility of developing tachycardia mediated cardiomyopathy.    Plan:  I have discussed my concerns regarding the patient's heart rhythm with her at length in the office today.  She is currently taking metoprolol 100 mg by mouth twice daily.  I am reluctant to push this dose much further in favor sending her directly for formal evaluation to involve at the very least 12-lead EKG and follow-up echocardiogram.  I have offered to make arrangements for her to be seen in follow-up by Dr. Curt Bears or [name] as soon as practical.  We have not recommended any changes to her current medications at this time but I have  encouraged the patient to check her pulse and blood pressure on a regular basis.  All of her questions have been addressed.  I spent in excess of 15 minutes during the conduct of this office consultation and >50% of this time involved direct face-to-face encounter with the patient for counseling and/or coordination of their care.   Valentina Gu. Roxy Manns, MD 09/03/2019 1:14 PM

## 2019-09-05 ENCOUNTER — Other Ambulatory Visit: Payer: Self-pay

## 2019-09-05 ENCOUNTER — Ambulatory Visit (HOSPITAL_COMMUNITY): Payer: Medicare Other | Attending: Internal Medicine

## 2019-09-05 DIAGNOSIS — I2510 Atherosclerotic heart disease of native coronary artery without angina pectoris: Secondary | ICD-10-CM | POA: Insufficient documentation

## 2019-09-05 DIAGNOSIS — I119 Hypertensive heart disease without heart failure: Secondary | ICD-10-CM | POA: Diagnosis not present

## 2019-09-05 DIAGNOSIS — E785 Hyperlipidemia, unspecified: Secondary | ICD-10-CM | POA: Diagnosis not present

## 2019-09-05 DIAGNOSIS — Z951 Presence of aortocoronary bypass graft: Secondary | ICD-10-CM | POA: Diagnosis not present

## 2019-09-05 DIAGNOSIS — I340 Nonrheumatic mitral (valve) insufficiency: Secondary | ICD-10-CM | POA: Diagnosis not present

## 2019-09-05 DIAGNOSIS — Z952 Presence of prosthetic heart valve: Secondary | ICD-10-CM | POA: Diagnosis not present

## 2019-09-05 DIAGNOSIS — Z853 Personal history of malignant neoplasm of breast: Secondary | ICD-10-CM | POA: Diagnosis not present

## 2019-09-05 DIAGNOSIS — Z9889 Other specified postprocedural states: Secondary | ICD-10-CM | POA: Diagnosis not present

## 2019-09-06 NOTE — Progress Notes (Signed)
Thanks Cub![name]  Anderson Malta please have Mrs. Michele Green to come in to discuss Atrial flutter and management options.

## 2019-09-06 NOTE — Progress Notes (Signed)
Will,  I read your note from December 18, 2018. Is rate control our only option? I started Digoxin but it was discontinue when she was seen at your January visit. I am happy to see her in the office if there is nothing further to offer from EP standpoint.  Also, she may not let us do anything else.

## 2019-09-07 ENCOUNTER — Other Ambulatory Visit: Payer: Self-pay | Admitting: Nurse Practitioner

## 2019-09-07 DIAGNOSIS — Z170 Estrogen receptor positive status [ER+]: Secondary | ICD-10-CM

## 2019-09-07 DIAGNOSIS — C50412 Malignant neoplasm of upper-outer quadrant of left female breast: Secondary | ICD-10-CM

## 2019-09-14 IMAGING — CR DG CHEST 2V
2 series · 2 of 2 positions shown · non-contrast
Comparison: Radiographs May 31, 2018.

CLINICAL DATA: Mitral valve insufficiency. Coronary artery disease.

EXAM:
CHEST - 2 VIEW

[w chest pa]
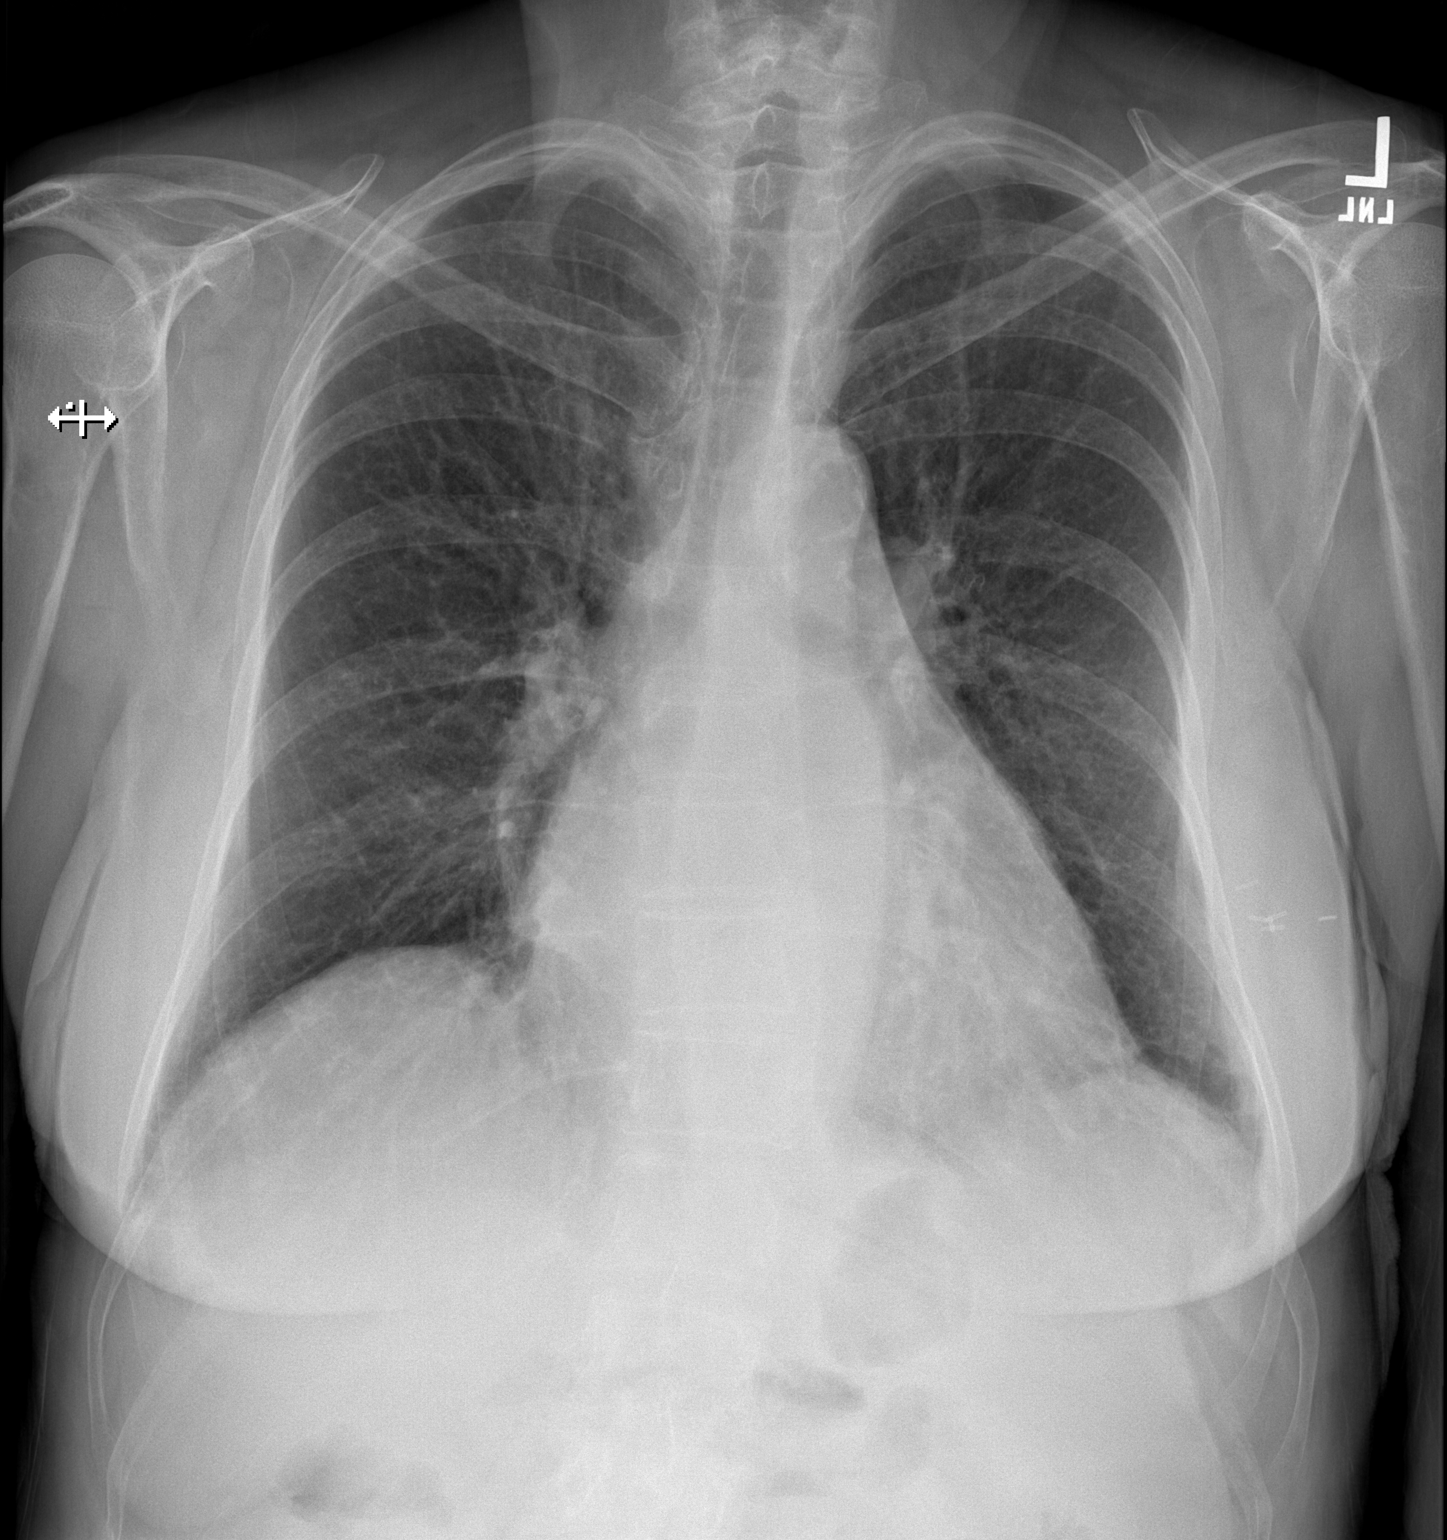

[w chest lat]
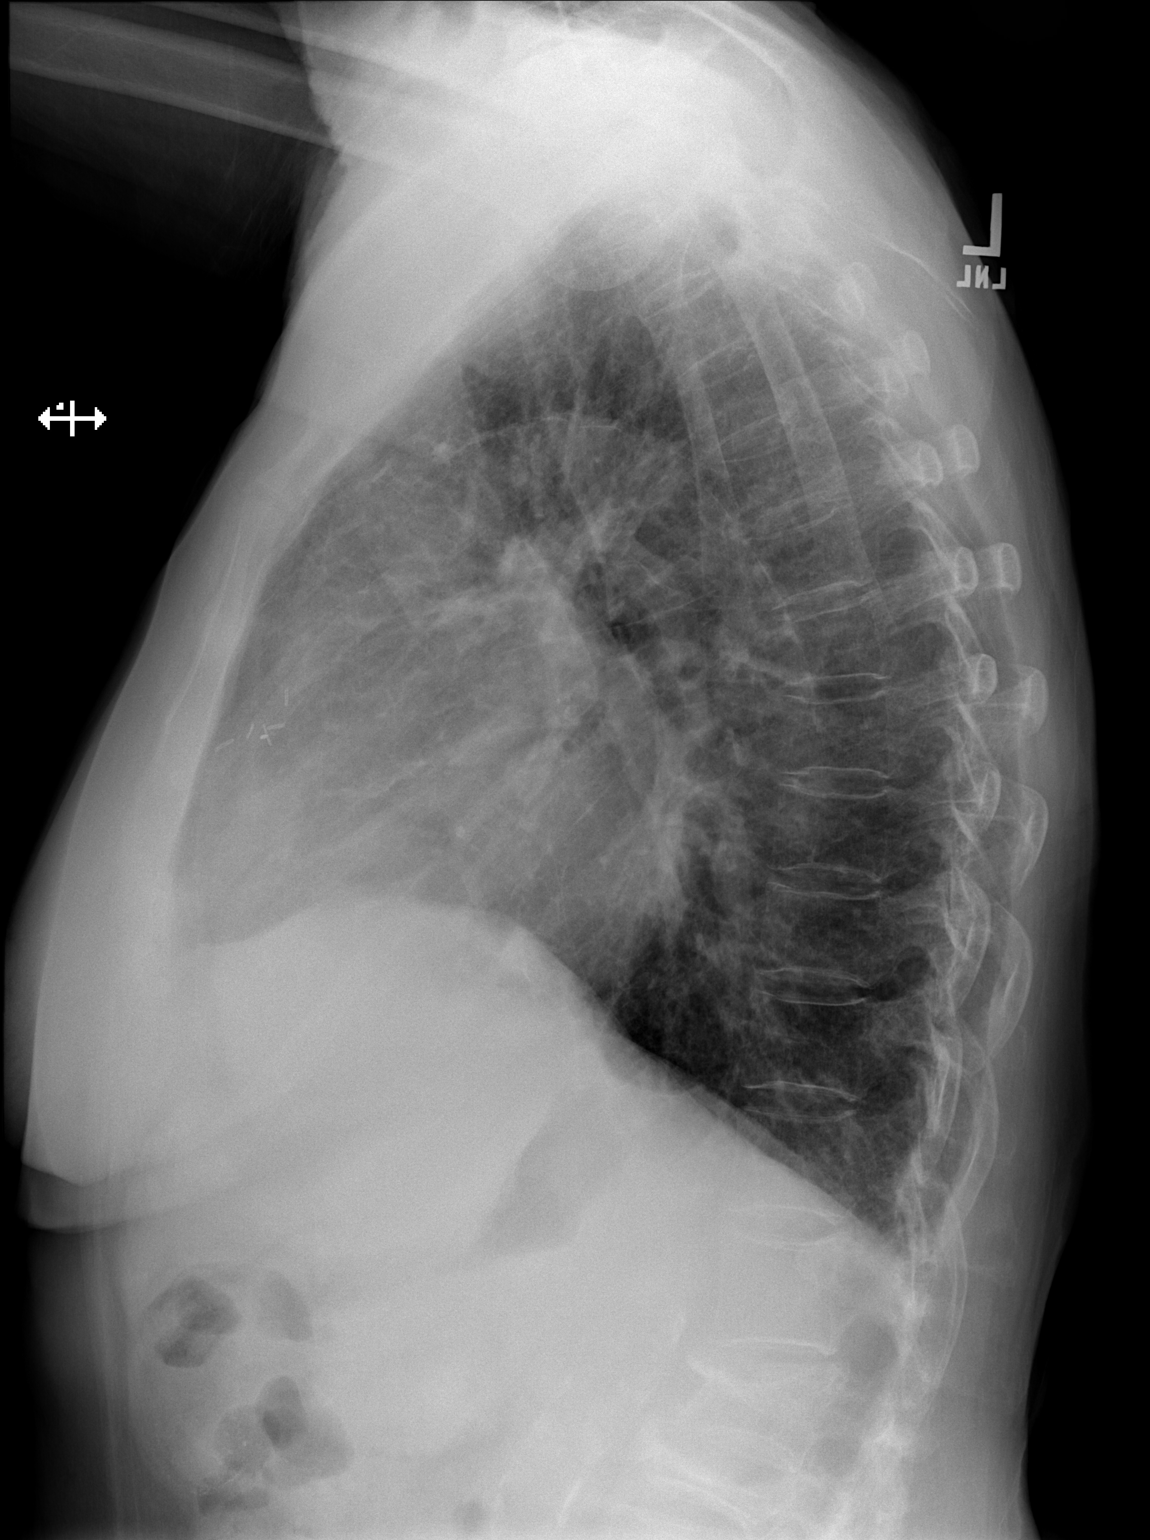

[2 of 2 positions shown; findings below may reference images not displayed]

FINDINGS: Stable cardiomediastinal silhouette. No pneumothorax or pleural
effusion is noted. Both lungs are clear. The visualized skeletal
structures are unremarkable.
IMPRESSION: No active cardiopulmonary disease.

## 2019-09-19 IMAGING — DX DG CHEST 1V PORT
1 series · 1 of 1 positions shown · non-contrast
Comparison: 08/12/2018 and prior radiographs

CLINICAL DATA: PICC line placement.

EXAM:
PORTABLE CHEST 1 VIEW

[chest ap]
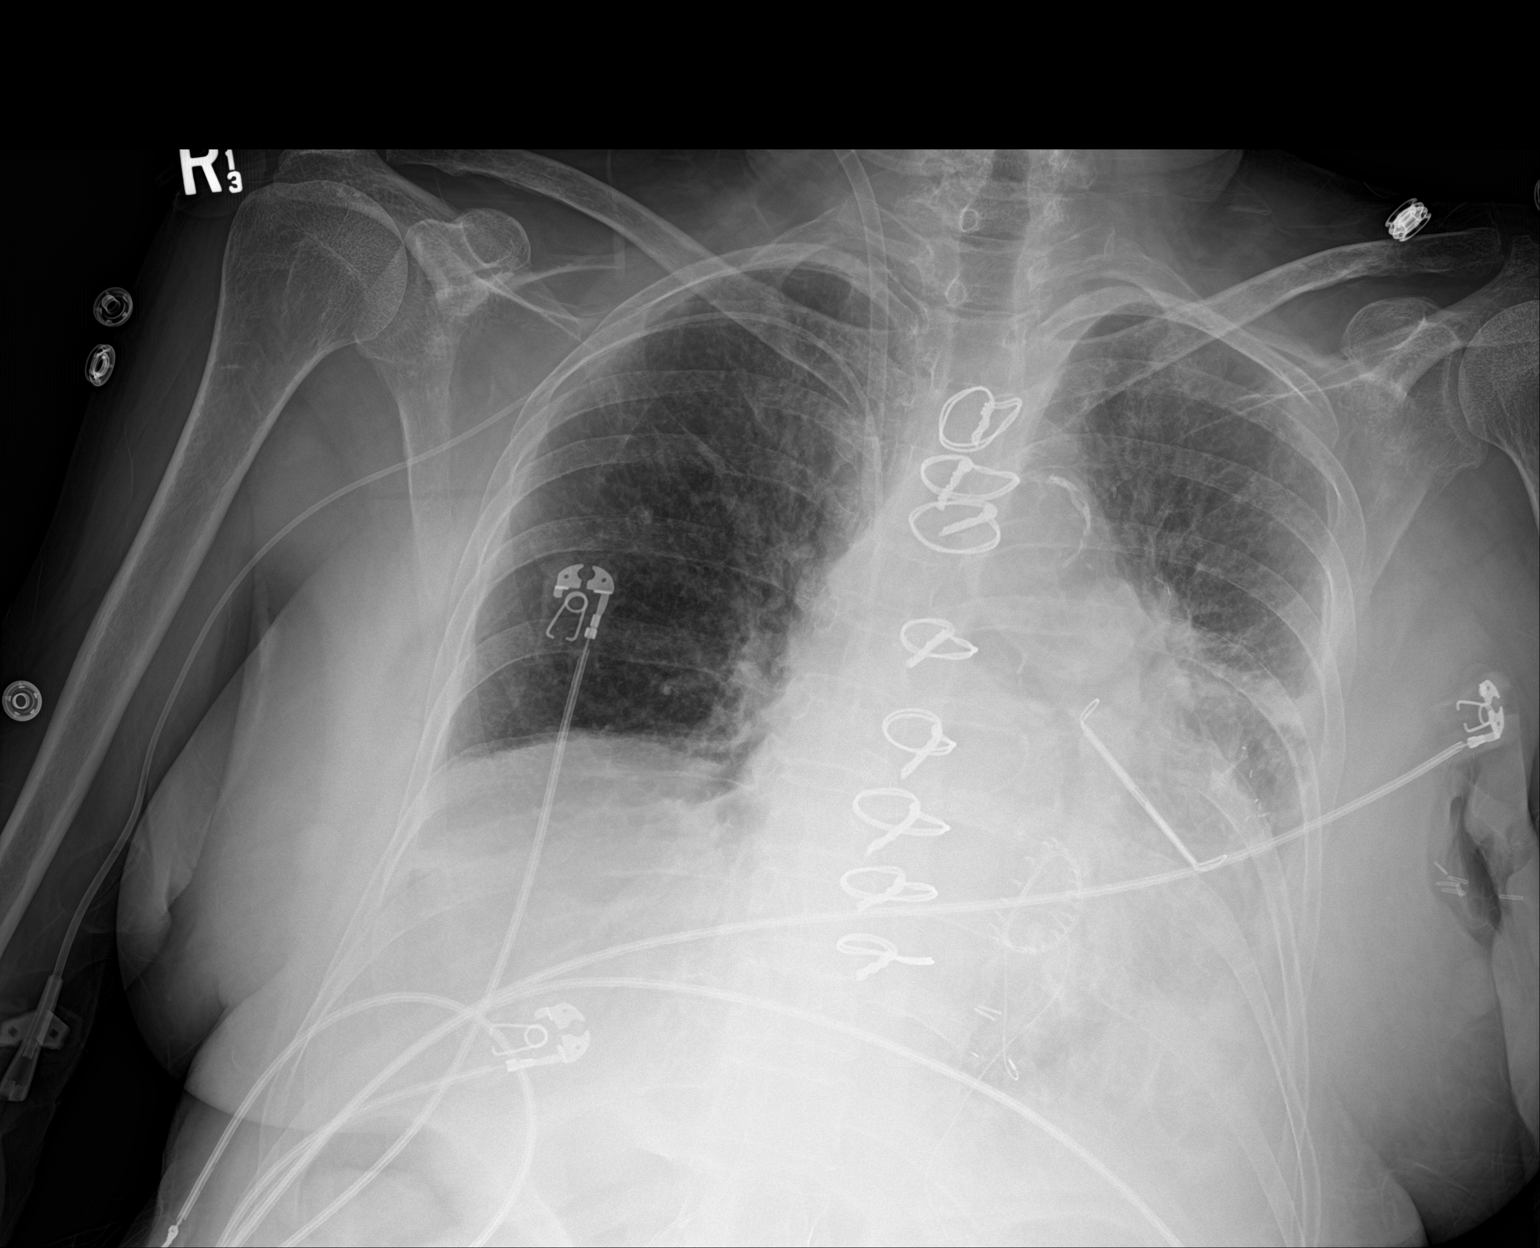

[1 of 1 positions shown; findings below may reference images not displayed]

FINDINGS: A RIGHT PICC line is in place with tip at the SUPERIOR cavoatrial
junction.

A RIGHT IJ central venous catheter sheath is noted.

CABG/mitral valve replacement and LEFT atrial clip again noted.

LEFT mid and lower lung opacities/atelectasis again noted.

There has been interval removal of mediastinal and thoracostomy
tubes.

There is no evidence of pneumothorax.
IMPRESSION: RIGHT PICC line with tip overlying the SUPERIOR cavoatrial junction.

Mediastinal and LEFT thoracostomy tube removal. No pneumothorax.
Continued LEFT lung opacity/atelectasis.

## 2019-09-19 IMAGING — DX DG CHEST 1V PORT
1 series · 1 of 1 positions shown · non-contrast
Comparison: 08/11/2018 and earlier.

CLINICAL DATA: 77-year-old female status post CABG, mitral valve
repair, Maze procedure postoperative day 2.

EXAM:
PORTABLE CHEST 1 VIEW

[chest]
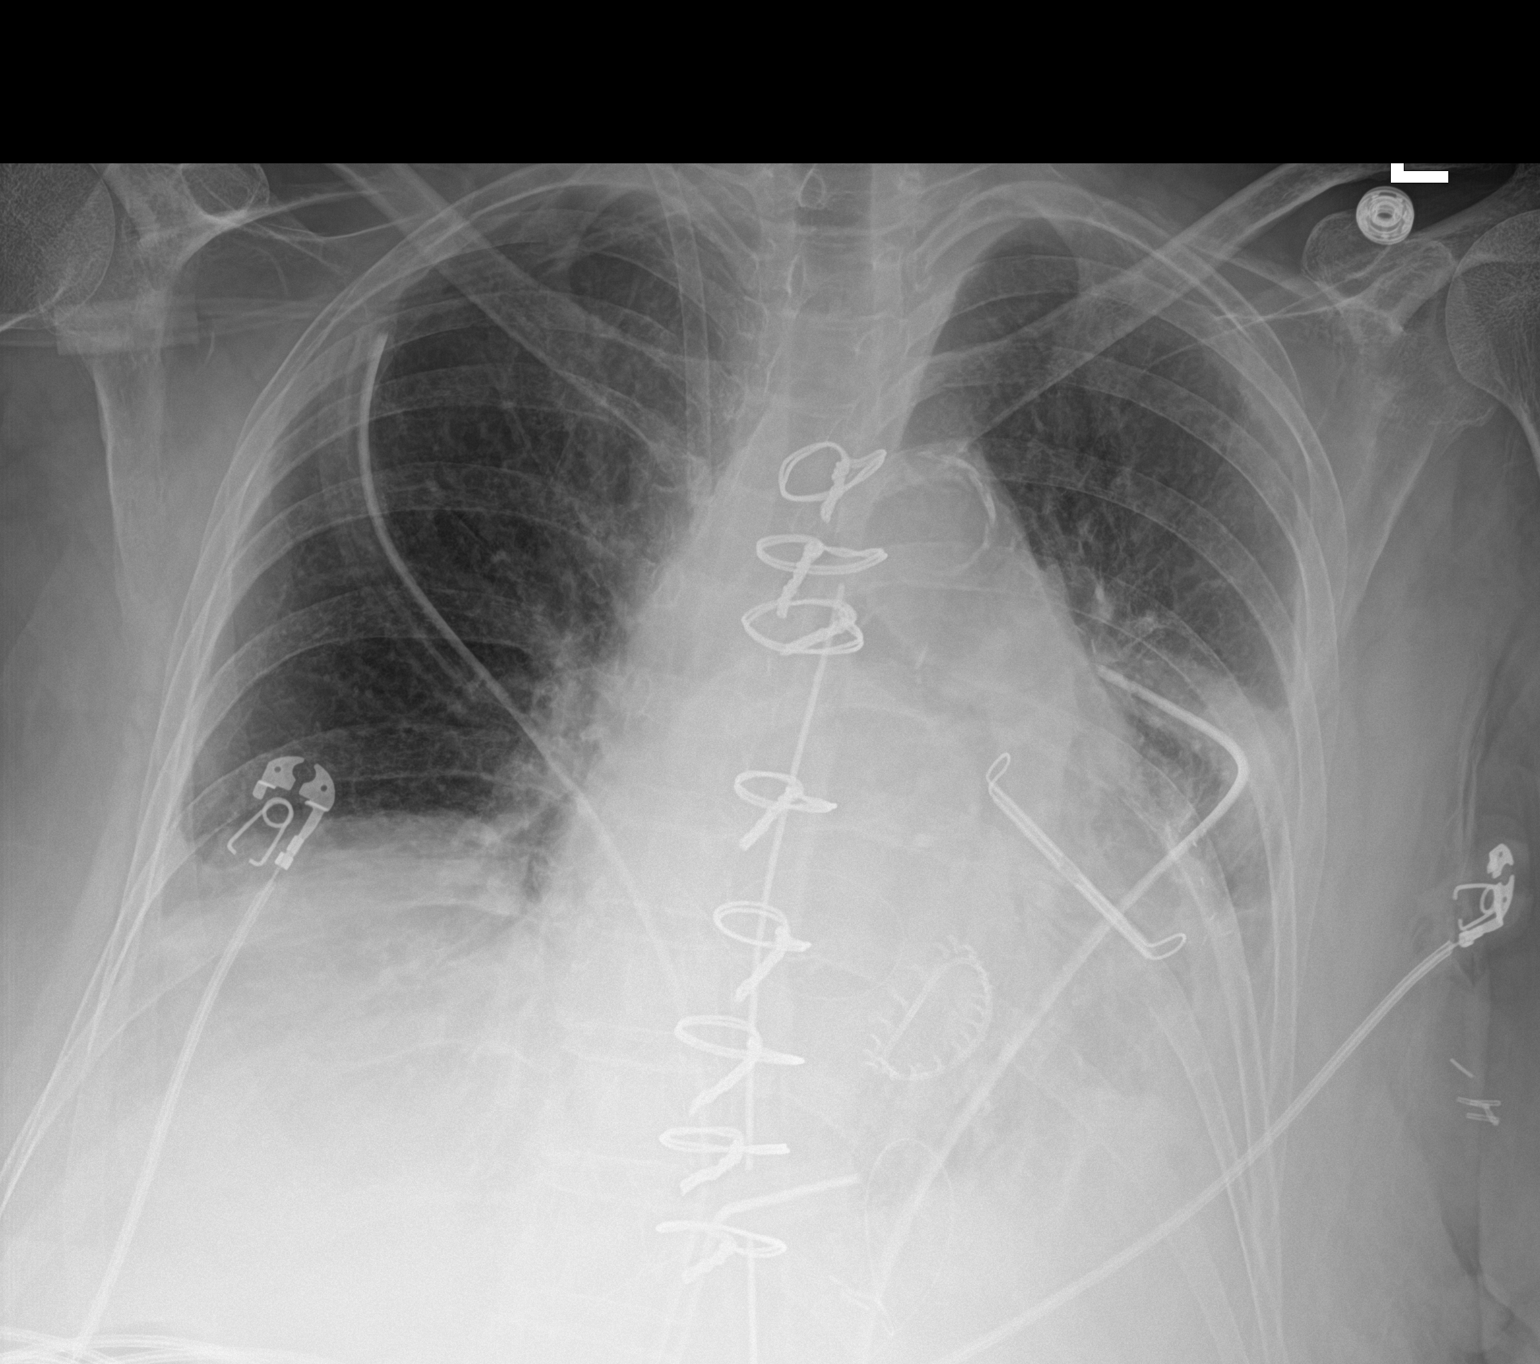

[1 of 1 positions shown; findings below may reference images not displayed]

FINDINGS: Portable AP semi upright view at [id number] hours. Swan-Ganz catheter has
been removed. Right IJ introducer sheath remains. Stable bilateral
chest tubes and mediastinal tube.

Mildly improved lung volumes. No pneumothorax or pulmonary edema.
Probable small pleural effusions. Continued bibasilar atelectasis.
Stable cardiac size and mediastinal contours. Calcified aortic
atherosclerosis. Visualized tracheal air column is within normal
limits. Paucity bowel gas in the upper abdomen.
IMPRESSION: 1. Swan-Ganz catheter removed, otherwise stable lines and tubes.
2. Mildly improved lung volumes. Continued atelectasis and probable
small pleural effusions.

## 2019-09-21 IMAGING — DX DG CHEST 2V
2 series · 2 of 2 positions shown · non-contrast
Comparison: 08/13/2018

CLINICAL DATA: Status post open heart surgery for valve repair

EXAM:
CHEST - 2 VIEW

[chest pa]
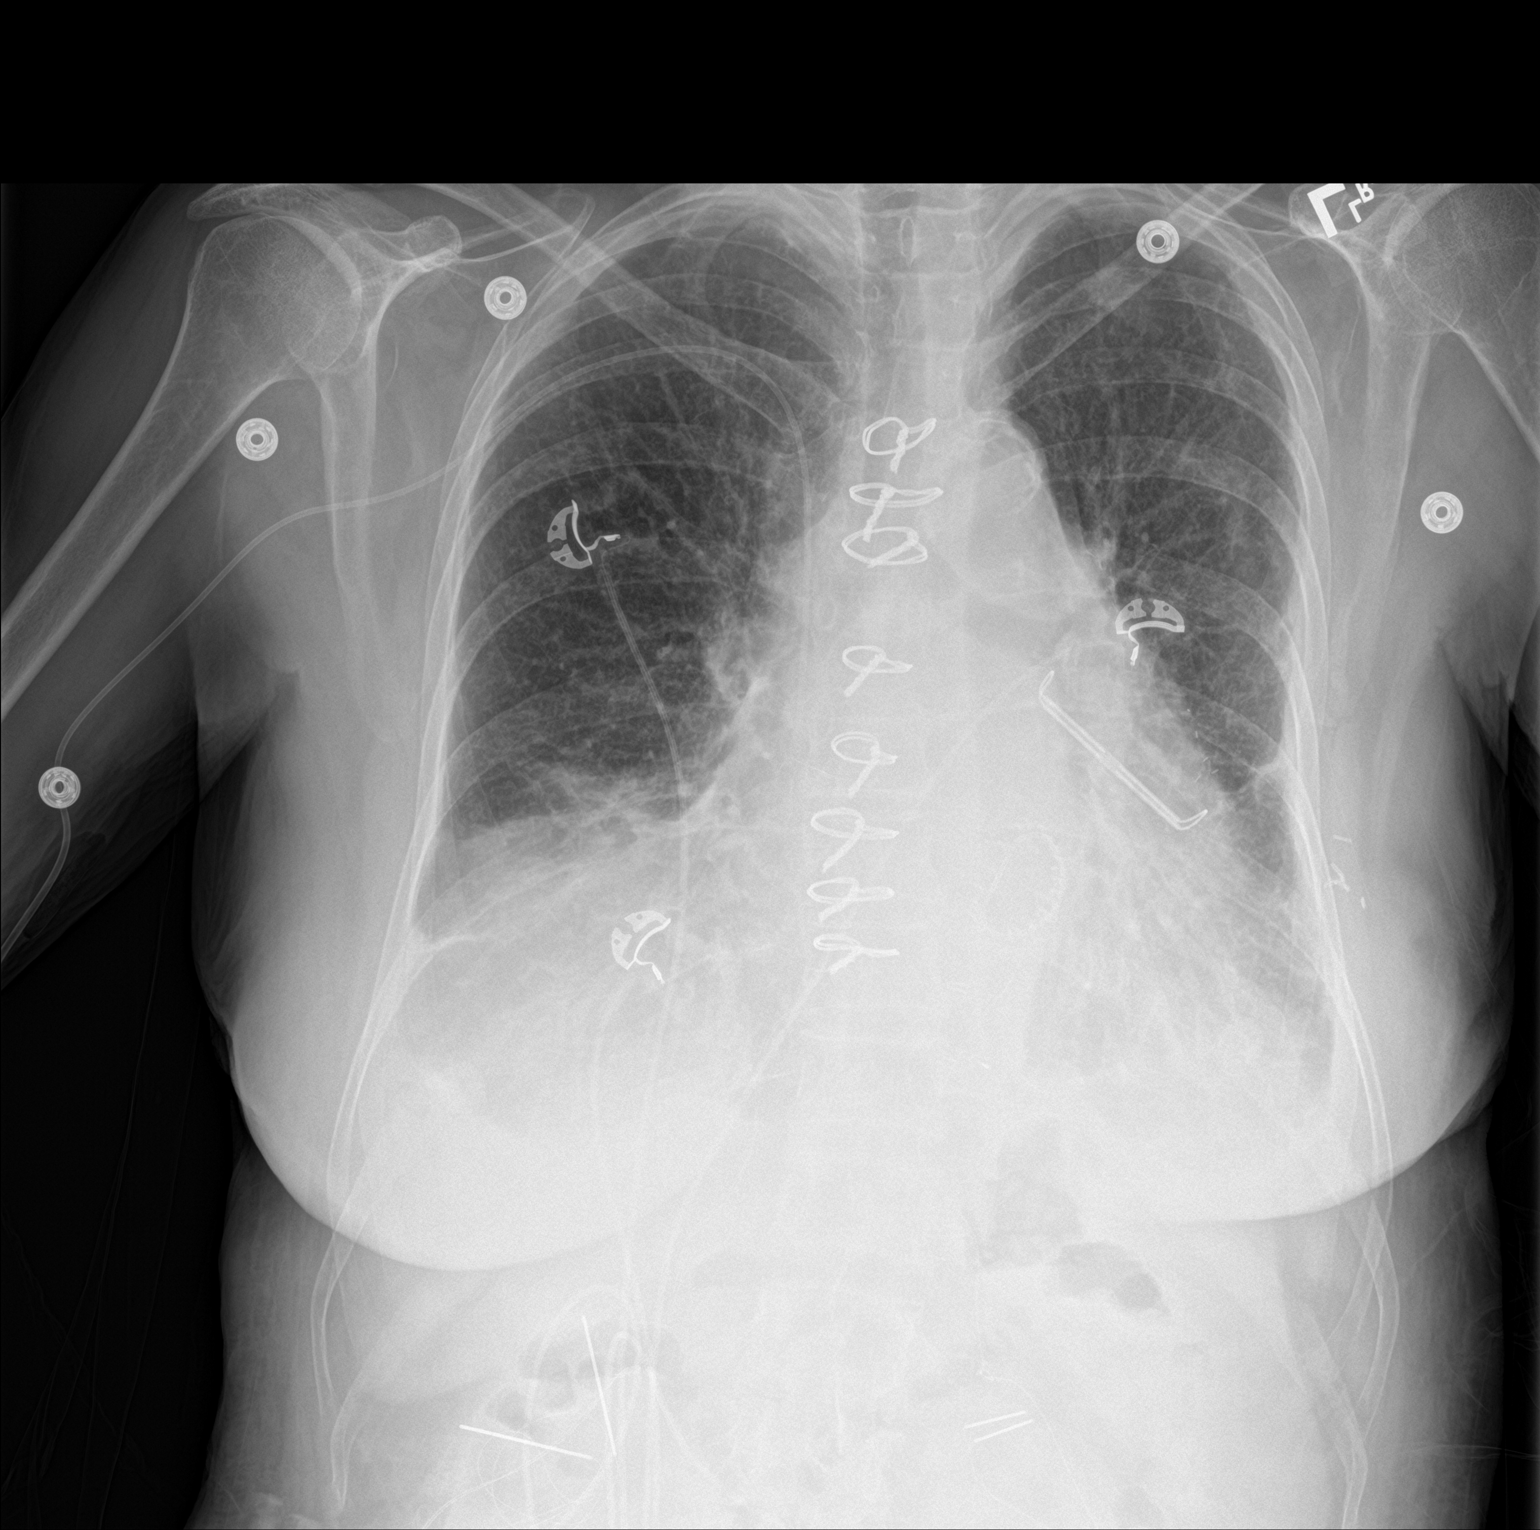

[chest lat]
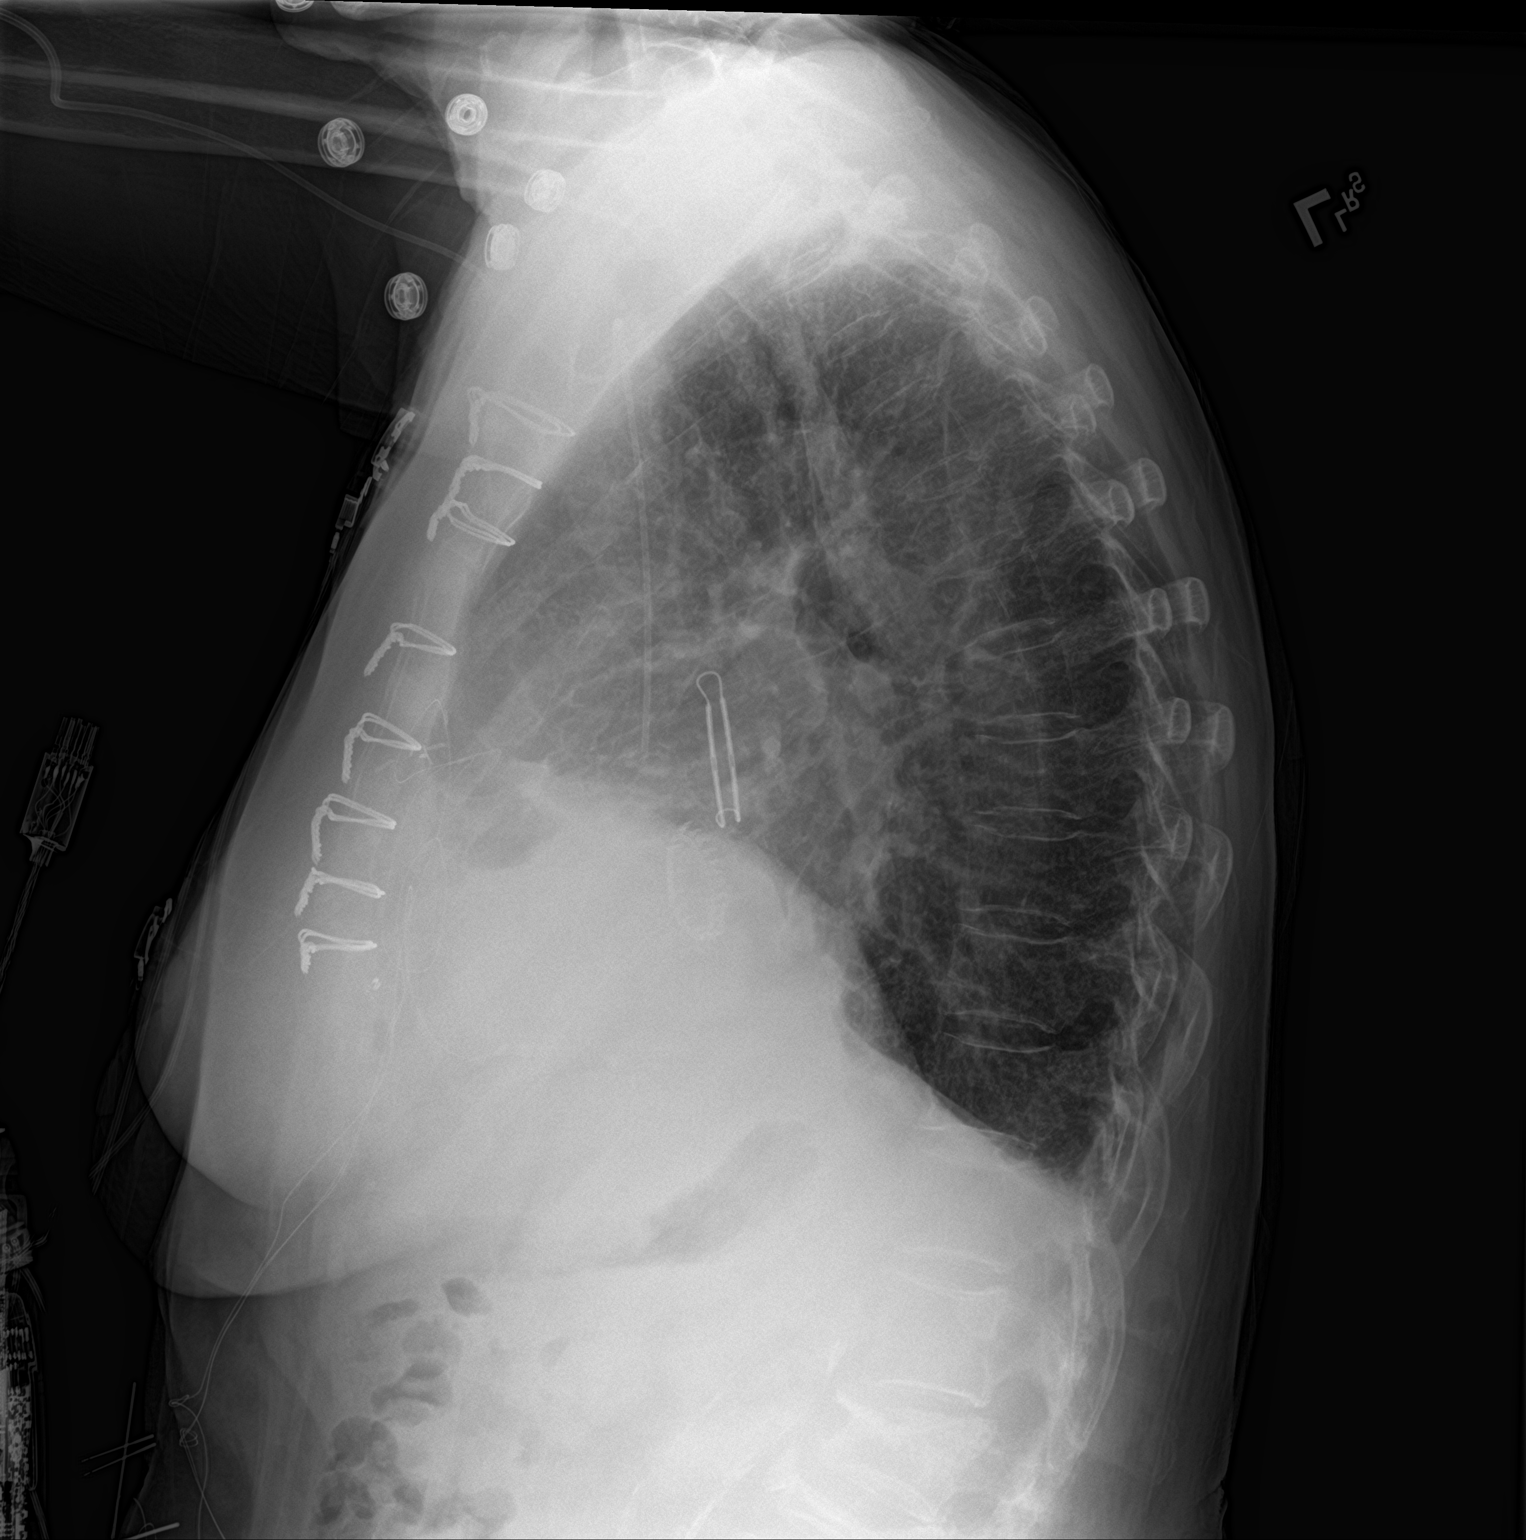

[2 of 2 positions shown; findings below may reference images not displayed]

FINDINGS: There is a right arm PICC line with tip at the cavoatrial junction.
Status post median sternotomy. Prosthetic mitral valve. Bilateral
pleural effusions and bibasilar atelectasis is again noted. Mild
pulmonary edema.
IMPRESSION: 1. Mild CHF.
2. Bibasilar atelectasis.

## 2019-09-25 ENCOUNTER — Ambulatory Visit: Payer: Medicare Other | Admitting: Cardiology

## 2019-09-25 ENCOUNTER — Other Ambulatory Visit: Payer: Self-pay

## 2019-09-25 ENCOUNTER — Encounter: Payer: Self-pay | Admitting: Cardiology

## 2019-09-25 VITALS — BP 126/80 | HR 128 | Ht 64.0 in | Wt 134.0 lb

## 2019-09-25 DIAGNOSIS — Z79899 Other long term (current) drug therapy: Secondary | ICD-10-CM | POA: Diagnosis not present

## 2019-09-25 DIAGNOSIS — I484 Atypical atrial flutter: Secondary | ICD-10-CM | POA: Diagnosis not present

## 2019-09-25 DIAGNOSIS — Z01812 Encounter for preprocedural laboratory examination: Secondary | ICD-10-CM | POA: Diagnosis not present

## 2019-09-25 MED ORDER — AMIODARONE HCL 200 MG PO TABS: 200.0000 mg | ORAL_TABLET | Freq: Every day | ORAL | 3 refills | Status: DC

## 2019-09-25 MED ORDER — AMIODARONE HCL 200 MG PO TABS: ORAL_TABLET | ORAL | 0 refills | Status: DC

## 2019-09-25 NOTE — Progress Notes (Signed)
Electrophysiology Office Note   Date:  09/25/2019   ID:  Michele Green, DOB 10-08-1941, MRN [id number]  PCP:  [name], MD  Cardiologist:  Dr Tamala Julian Primary Electrophysiologist: Dr Curt Bears    No chief complaint on file.    History of Present Illness: Michele Green is a 78 y.o. female who is being seen today for the evaluation of atrial fibrillation at the request of [name], MD. She has a PMH significant for CAD s/p CABG, MR s/p repair, breast cancer, and HTN. She reports that she has had atrial fibrillation for many years and has been fairly asymptomatic. She had a Maze procedure at the time of her CABG and MV repair surgery on 08/10/18. Since then, she has also had a DCCV on 10/05/18 but again went back into rate controlled atrial fibrillation. She is in atrial flutter today. She reports that she continues to feel stronger after surgery. Patient is able to exercise at cardiac rehab without issue.  Today, denies symptoms of palpitations, chest pain, orthopnea, PND, lower extremity edema, claudication, dizziness, presyncope, syncope, bleeding, or neurologic sequela. The patient is tolerating medications without difficulties.  Main complaint today is dyspnea on exertion.  She has been in atrial flutter for the last few weeks at least.  She says that her heart rate has been fast which is abnormal for her.  She otherwise feels well without major complaint at this time.   Past Medical History:  Diagnosis Date  . Arrhythmia   . Atypical atrial flutter ([place]) 08/14/2018   Post-operative  . Breast cancer of upper-outer quadrant of left female breast ([place]) 02/02/2016  . CAD (coronary artery disease) 06/20/2018   LHC 7/19: pLAD 65/90, oD1 90, mLCx 85, OM2 50, oRCA 30, EF 50-55 >> s/p CABG in 9/19 (L-LAD)  . Colon polyp 02/2012  . Dupuytren contracture    bilateral hands  . Family history of breast cancer   . Hyperlipidemia   . Hypertension   . Mitral regurgitation 11/07/2013    Echo 7/19: Mild LVH, EF 60-65, no RWMA, mod ot severe MR, massive LAE, PASP 33 // TEE 7/19:  Mild conc LVH, EF 60-65, no RWMA, severe MR with mild post leaflet prolapse, massive // s/p MV repair 07/2018  . On continuous oral anticoagulation 08/22/2015   Started on Xarelto 08/12/2015   . Osteopenia   . Persistent atrial fibrillation ([place]) 08/22/2015   Started late August or early September 2016 // s/p Maze procedure 07/2018  . Personal history of radiation therapy   . Radiation Therapy 04/21/16-05/19/16   left breast 47.72 Gy, boosted to 10 Gy  . 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 S/P Maze operation for atrial fibrillation 08/10/2018   Complete bilateral atrial lesion set using bipolar radiofrequency and cryothermy ablation with clipping of LA appendage  . S/P MVR (mitral valve repair) 08/10/2018   Complex valvuloplasty including Gore-tex neochord placement x8, Plication of Lateral Commissure and 87m Sorin Memo 4D Ring Annuloplasty SN# [id number] . Ulcerative colitis ([place]   . Wears glasses    Past Surgical History:  Procedure Laterality Date  . BREAST BIOPSY Left 01/28/2016  . BREAST LUMPECTOMY Left 02/23/2016  . BREAST LUMPECTOMY WITH RADIOACTIVE SEED LOCALIZATION Left 02/23/2016   Procedure: BREAST LUMPECTOMY WITH RADIOACTIVE SEED LOCALIZATION;  Surgeon: BExcell Seltzer MD;  Location[place]  Service: General;  Laterality: Left;  . CARDIAC CATHETERIZATION    . CARDIOVERSION N/A 10/02/2015  Procedure: CARDIOVERSION;  Surgeon: Jerline Pain, MD;  Location[facility];  Service: Cardiovascular;  Laterality: N/A;  . CARDIOVERSION N/A 10/05/2018   Procedure: CARDIOVERSION;  Surgeon: Fay Records, MD;  Location: [place];  Service: Cardiovascular;  Laterality: N/A;  . CLIPPING OF ATRIAL APPENDAGE  08/10/2018   Procedure: CLIPPING OF ATRIAL APPENDAGE;  Surgeon: Rexene Alberts, MD;  Location: [place];  Service: Open Heart Surgery;;  . COLONOSCOPY    . CORONARY  ARTERY BYPASS GRAFT N/A 08/10/2018   Procedure: CORONARY ARTERY BYPASS GRAFTING (CABG) x 1, LIMA-LAD,  USING LEFT INTERNAL MAMMARY ARTERY. HARVESTED RIGHT GREATER SAPHENOUS VEIN ENDOSCOPICALLY;  Surgeon: Rexene Alberts, MD;  Location: [place];  Service: Open Heart Surgery;  Laterality: N/A;  . CYSTOSCOPY W/ RETROGRADES Bilateral 06/20/2019   Procedure: CYSTOSCOPY WITH RETROGRADE PYELOGRAM;  Surgeon: Ardis Hughs, MD;  Location: [facility];  Service: Urology;  Laterality: Bilateral;  . CYSTOSCOPY WITH HYDRODISTENSION AND BIOPSY N/A 06/20/2019   Procedure: CYSTOSCOPY BLADDER  BIOPSY WITH FULGERATION;  Surgeon: Ardis Hughs, MD;  Location: [facility];  Service: Urology;  Laterality: N/A;  . DUPUYTREN CONTRACTURE RELEASE  2001   leftx2  . [place]   right  . DUPUYTREN CONTRACTURE RELEASE Right 05/02/2014   Procedure: EXCISION DUPUYTRENS RIGHT PALMAR/SMALL ;  Surgeon: Cammie Sickle, MD;  Location: [place];  Service: Orthopedics;  Laterality: Right;  [name] MAZE N/A 08/10/2018   Procedure: MAZE;  Surgeon: Rexene Alberts, MD;  Location: [place];  Service: Open Heart Surgery;  Laterality: N/A;  . MITRAL VALVE REPAIR N/A 08/10/2018   Procedure: MITRAL VALVE REPAIR (MVR);  Surgeon: Rexene Alberts, MD;  Location: [place];  Service: Open Heart Surgery;  Laterality: N/A;  glutaraldehyde  . RIGHT/LEFT HEART CATH AND CORONARY ANGIOGRAPHY N/A 06/20/2018   Procedure: RIGHT/LEFT HEART CATH AND CORONARY ANGIOGRAPHY;  Surgeon: [name], MD;  Location: [place] [facility];  Service: Cardiovascular;  Laterality: N/A;  . TEE WITHOUT CARDIOVERSION N/A 06/20/2018   Procedure: TRANSESOPHAGEAL ECHOCARDIOGRAM (TEE);  Surgeon: Pixie Casino, MD;  Location: [facility] ENDOSCOPY;  Service: Cardiovascular;  Laterality: N/A;  . TEE WITHOUT CARDIOVERSION N/A 08/10/2018   Procedure: TRANSESOPHAGEAL ECHOCARDIOGRAM (TEE);  Surgeon: Rexene Alberts,  MD;  Location: [place];  Service: Open Heart Surgery;  Laterality: N/A;  . TONSILLECTOMY  age 12     Current Outpatient Medications  Medication Sig Dispense Refill  . acetaminophen (TYLENOL) 325 MG tablet Take 2 tablets (650 mg total) by mouth every 6 (six) hours as needed for mild pain or fever.    [name] allopurinol (ZYLOPRIM) 100 MG tablet Take 200 mg by mouth every evening.     . furosemide (LASIX) 20 MG tablet Take 20 mg by mouth daily as needed (for swelling/fluid retention.).     [name] KLOR-CON M20 20 MEQ tablet TAKE 1 TABLET BY MOUTH EVERY DAY 90 tablet 3  . mesalamine (APRISO) 0.375 g 24 hr capsule Take 4 g by mouth daily.     . metoprolol tartrate (LOPRESSOR) 100 MG tablet Take 1 tablet (100 mg total) by mouth 2 (two) times daily. 180 tablet 3  . phenazopyridine (PYRIDIUM) 200 MG tablet Take 1 tablet (200 mg total) by mouth 3 (three) times daily as needed for pain. 10 tablet 0  . simvastatin (ZOCOR) 20 MG tablet Take 20 mg by mouth every evening.     . tamoxifen (NOLVADEX) 20 MG  tablet TAKE 1 TABLET BY MOUTH EVERY DAY 90 tablet 1  . traMADol (ULTRAM) 50 MG tablet Take 1-2 tablets (50-100 mg total) by mouth every 6 (six) hours as needed for moderate pain. 15 tablet 0  . XARELTO 15 MG TABS tablet TAKE 1 TABLET BY MOUTH  DAILY WITH SUPPER 90 tablet 2  . amiodarone (PACERONE) 200 MG tablet Take 2 tablets (400 mg total) TWICE a day for 2 weeks, then take 1 tablet (200 mg total) TWICE a day for 2 weeks, then take 1 tablet ONCE daily 84 tablet 0  . amiodarone (PACERONE) 200 MG tablet Take 1 tablet (200 mg total) by mouth daily. 90 tablet 3   No current facility-administered medications for this visit.     Allergies:   Shellfish allergy   Social History:  The patient  reports that she quit smoking about 26 years ago. Her smoking use included cigarettes. She has a 20.00 pack-year smoking history. She has never used smokeless tobacco. She reports current alcohol use of about 3.0 standard drinks of  alcohol per week. She reports that she does not use drugs.   Family History:  The patient's family history includes Breast cancer in her sister; Breast cancer (age of onset: 25) in her mother; Cirrhosis in her mother; Heart attack in her father; Heart failure in her father; Kidney Stones in her sister.    ROS:  Please see the history of present illness.   Otherwise, review of systems is positive for none.   All other systems are reviewed and negative.   PHYSICAL EXAM: VS:  BP 126/80   Pulse (!) 128   Ht 5' 4"  (1.626 m)   Wt 134 lb (60.8 kg)   SpO2 99%   BMI 23.00 kg/m  , BMI Body mass index is 23 kg/m. GEN: Well nourished, well developed, in no acute distress  HEENT: normal  Neck: no JVD, carotid bruits, or masses Cardiac: Tachycardic, regular; no murmurs, rubs, or gallops,no edema  Respiratory:  clear to auscultation bilaterally, normal work of breathing GI: soft, nontender, nondistended, + BS MS: no deformity or atrophy  Skin: warm and dry Neuro:  Strength and sensation are intact Psych: euthymic mood, full affect  EKG:  EKG is ordered today. Personal review of the ekg ordered shows atrial flutter, rate 128   Recent Labs: 08/01/2019: ALT 10; BUN 11; Creatinine 0.92; Hemoglobin 11.6; Platelets 138; Potassium 3.8; Sodium 139    Lipid Panel  No results found for: CHOL, TRIG, HDL, CHOLHDL, VLDL, LDLCALC, LDLDIRECT   Wt Readings from Last 3 Encounters:  09/25/19 134 lb (60.8 kg)  09/03/19 127 lb (57.6 kg)  08/01/19 127 lb 3.2 oz (57.7 kg)      Other studies Reviewed: Additional studies/ records that were reviewed today include: TTE 09/05/2019 Review of the above records today demonstrates:   1. Left ventricular ejection fraction, by visual estimation, is 60 to 65%. The left ventricle has normal function. There is moderately increased left ventricular hypertrophy.  2. Global right ventricle has moderately reduced systolic function.The right ventricular size is normal. No  increase in right ventricular wall thickness.  3. Left atrial size was severely dilated.  4. Right atrial size was normal.  5. The mitral valve has been repaired/replaced. Mild mitral valve regurgitation. Mild-moderate mitral stenosis.  6. MV peak gradient, 19.4 mmHg. HR 117 during this study.  7. The tricuspid valve is grossly normal. Tricuspid valve regurgitation is mild.  8. The aortic valve is tricuspid Aortic  valve regurgitation was not visualized by color flow Doppler.  9. The pulmonic valve was grossly normal. Pulmonic valve regurgitation is trivial by color flow Doppler. 10. Moderately elevated pulmonary artery systolic pressure. 11. The inferior vena cava is dilated in size with <50% respiratory variability, suggesting right atrial pressure of 15 mmHg.   ASSESSMENT AND PLAN:  1.  Persistent atrial fibrillation Status post maze at 08/10/2018 with cardioversion 10/05/2018 with early return to atrial fibrillation.  Left atrium is severely dilated.  Had previously planned for a rate control strategy.    This patients CHA2DS2-VASc Score and unadjusted Ischemic Stroke Rate (% per year) is equal to 7.2 % stroke rate/year from a score of 5  Above score calculated as 1 point each if present [CHF, HTN, DM, Vascular=MI/PAD/Aortic Plaque, Age if 65-74, or Female] Above score calculated as 2 points each if present [Age > 75, or Stroke/TIA/TE]   2. CAD Status post CABG x1.  No obvious angina.  3. HTN Stable.  No changes at this time.  4. HLD Continue statin  5.  Atypical atrial flutter: Unfortunately her left atrium is severely dilated.  She is started having shortness of breath that is potentially due to her atrial flutter.  She has had a maze when she had her valve surgery.  We Sanaz Scarlett thus plan for amiodarone loading and cardioversion in 2 weeks.  If this makes her feel significantly better and she stays in rhythm, Brion Hedges likely continue amiodarone, otherwise Haygen Zebrowski potentially plan for  ablation.  Hopefully we can avoid further invasive procedures.  Current medicines are reviewed at length with the patient today.   The patient does not have concerns regarding her medicines.  The following changes were made today: Start amiodarone  Labs/ tests ordered today include:  Orders Placed This Encounter  Procedures  . TSH  . Basic metabolic panel  . CBC  . EKG 12-Lead    Disposition:   FU with Kimberlie Csaszar 3 months.   Signed, Reinette Cuneo Meredith Leeds, MD  09/25/2019 2:14 PM     [facility] [address] [place] [address] [phone] (office) [phone] (fax)

## 2019-09-25 NOTE — Patient Instructions (Signed)
Medication Instructions:  Your physician has recommended you make the following change in your medication:  1. START Amiodarone  - take 2 tablets (400 mg total) TWICE a day for 2 weeks, then  - take 1 tablet (200 mg total) TWICE a day for 2 weeks, then  - take 1 tablet (200 mg total) ONCE a day  * If you need a refill on your cardiac medications before your next appointment, please call your pharmacy.   Labwork: Today: TSH, BMET & CBC If you have labs (blood work) drawn today and your tests are completely normal, you will receive your results only by:  [place] (if you have MyChart) OR  A paper copy in the mail If you have any lab test that is abnormal or we need to change your treatment, we will call you to review the results.  Testing/Procedures: Your physician has recommended that you have a Cardioversion (DCCV). Electrical Cardioversion uses a jolt of electricity to your heart either through paddles or wired patches attached to your chest. This is a controlled, usually prescheduled, procedure. Defibrillation is done under light anesthesia in the hospital, and you usually go home the day of the procedure. This is done to get your heart back into a normal rhythm. You are not awake for the procedure. Please see the instruction sheet given to you today.  Cardioversion Instructions  COVID TEST-- On 10/09/19 @ 12:00 pm - You will go to [facility] hospital ([place]) for your Covid testing.   This is a drive thru test site.  There will be multiple testing areas.  Be sure to share with the first checkpoint that you are there for pre-procedure/surgery testing. This will put you into the right (yellow) lane that leads to the PAT testing team. Stay in your car and the nurse team will come to your car to test you.  After you are tested please go home and self quarantine until the day of your procedure.    You are scheduled for a cardioversion on 10/12/19  with Dr.  Oval Linsey or associates. Please go to [facility]  at 8:00 am. Enter through the [place] not have any food or drink after midnight the night before this procedure.  Hold your Lasix the morning of your procedure. You may take your remaining medicines with a sip of water on the day of your procedure.  You will need someone to drive you home following your procedure.    Follow-Up: At [facility], you and your health needs are our priority.  As part of our continuing mission to provide you with exceptional heart care, we have created designated Provider Care Teams.  These Care Teams include your primary Cardiologist (physician) and Advanced Practice Providers (APPs -  Physician Assistants and Nurse Practitioners) who all work together to provide you with the care you need, when you need it.  You will need a follow up appointment in 3 months.  Please call our office 2 months in advance to schedule this appointment.  You may see Dr Curt Bears or one of the following Advanced Practice Providers on your designated Care Team:    Chanetta Marshall, NP  Tommye Standard, PA-C  Oda Kilts, [place]  Thank you for choosing [facility]!!   Trinidad Curet, RN [phone]  Any Other Special Instructions Will Be Listed Below (If Applicable).  Electrical Cardioversion Electrical cardioversion is the delivery of a jolt of electricity to change the rhythm of the heart. Sticky patches or metal paddles are placed on the chest to deliver the electricity from a device. This is done to restore a normal rhythm. A rhythm that is too fast or not regular keeps the heart from pumping well. Electrical cardioversion is done in an emergency if:   There is low or no blood pressure as a result of the heart rhythm.    Normal rhythm must be restored as fast as possible to protect the brain and heart from further damage.    It may save a life. Cardioversion may be done for heart  rhythms that are not immediately life threatening, such as atrial fibrillation or flutter, in which:   The heart is beating too fast or is not regular.    Medicine to change the rhythm has not worked.    It is safe to wait in order to allow time for preparation.  Symptoms of the abnormal rhythm are bothersome.  The risk of stroke and other serious problems can be reduced.  LET [facility] CARE PROVIDER KNOW ABOUT:   Any allergies you have.  All medicines you are taking, including vitamins, herbs, eye drops, creams, and over-the-counter medicines.  Previous problems you or members of your family have had with the use of anesthetics.    Any blood disorders you have.    Previous surgeries you have had.    Medical conditions you have.   RISKS AND COMPLICATIONS  Generally, this is a safe procedure. However, problems can occur and include:   Breathing problems related to the anesthetic used.  A blood clot that breaks free and travels to other parts of your body. This could cause a stroke or other problems. The risk of this is lowered by use of blood-thinning medicine (anticoagulant) prior to the procedure.  Cardiac arrest (rare).   BEFORE THE PROCEDURE   You may have tests to detect blood clots in your heart and to evaluate heart function.   You may start taking anticoagulants so your blood does not clot as easily.    Medicines may be given to help stabilize your heart rate and rhythm.   PROCEDURE  You will be given medicine through an IV tube to reduce discomfort and make you sleepy (sedative).    An electrical shock will be delivered.   AFTER THE PROCEDURE Your heart rhythm will be watched to make sure it does not change. You will need someone to drive you home.

## 2019-09-26 LAB — CBC
Hematocrit: 32.4 % — ABNORMAL LOW (ref 34.0–46.6)
Hemoglobin: 10.9 g/dL — ABNORMAL LOW (ref 11.1–15.9)
MCH: 35 pg — ABNORMAL HIGH (ref 26.6–33.0)
MCHC: 33.6 g/dL (ref 31.5–35.7)
MCV: 104 fL — ABNORMAL HIGH (ref 79–97)
Platelets: 124 10*3/uL — ABNORMAL LOW (ref 150–450)
RBC: 3.11 x10E6/uL — ABNORMAL LOW (ref 3.77–5.28)
RDW: 12.1 % (ref 11.7–15.4)
WBC: 6.5 10*3/uL (ref 3.4–10.8)

## 2019-09-26 LAB — BASIC METABOLIC PANEL
BUN/Creatinine Ratio: 18 (ref 12–28)
BUN: 15 mg/dL (ref 8–27)
CO2: 23 mmol/L (ref 20–29)
Calcium: 8.4 mg/dL — ABNORMAL LOW (ref 8.7–10.3)
Chloride: 106 mmol/L (ref 96–106)
Creatinine, Ser: 0.84 mg/dL (ref 0.57–1.00)
GFR calc Af Amer: 76 mL/min/{1.73_m2} (ref >59)
GFR calc non Af Amer: 66 mL/min/{1.73_m2} (ref >59)
Glucose: 114 mg/dL — ABNORMAL HIGH (ref 65–99)
Potassium: 4.4 mmol/L (ref 3.5–5.2)
Sodium: 145 mmol/L — ABNORMAL HIGH (ref 134–144)

## 2019-09-26 LAB — TSH: TSH: 0.882 u[IU]/mL (ref 0.450–4.500)

## 2019-09-27 ENCOUNTER — Telehealth: Payer: Self-pay | Admitting: Cardiology

## 2019-09-27 NOTE — Telephone Encounter (Signed)
New Message   Patient is returning call in reference to lab results.

## 2019-09-27 NOTE — Telephone Encounter (Signed)
Notes recorded by Constance Haw, MD on 09/26/2019 at 8:45 AM EST  Stable labs   The patient has been notified of the result and verbalized understanding.  All questions (if any) were answered. Nuala Alpha, LPN [id number]/7542 60:67 PM

## 2019-09-28 ENCOUNTER — Telehealth: Payer: Self-pay | Admitting: Cardiology

## 2019-09-28 NOTE — Telephone Encounter (Signed)
Pt asking if she is to continue Amiodarone.  Pt advised to continue taking and rationale given. Patient verbalized understanding and agreeable to plan.

## 2019-09-28 NOTE — Telephone Encounter (Signed)
New message   I called pt to schedule f/u appt with Dr. Curt Bears. She asked if she is to take her medication still.

## 2019-10-09 ENCOUNTER — Other Ambulatory Visit (HOSPITAL_COMMUNITY)
Admission: RE | Admit: 2019-10-09 | Discharge: 2019-10-09 | Disposition: A | Discharge status: Home / Self Care | Payer: Medicare Other | Source: Ambulatory Visit | Attending: Cardiovascular Disease | Admitting: Cardiovascular Disease

## 2019-10-09 DIAGNOSIS — Z01812 Encounter for preprocedural laboratory examination: Secondary | ICD-10-CM | POA: Diagnosis present

## 2019-10-09 DIAGNOSIS — Z20828 Contact with and (suspected) exposure to other viral communicable diseases: Secondary | ICD-10-CM | POA: Diagnosis not present

## 2019-10-10 LAB — NOVEL CORONAVIRUS, NAA (HOSP ORDER, SEND-OUT TO REF LAB; TAT 18-24 HRS): SARS-CoV-2, NAA: NOT DETECTED

## 2019-10-11 ENCOUNTER — Telehealth: Payer: Self-pay | Admitting: Cardiology

## 2019-10-11 NOTE — Telephone Encounter (Signed)
New Message  Patient is having a procedure on 10/12/19 and states that she has quit taking the xarelto 2 days prior to procedure. She states that she was advised that the procedure may have to be cancelled. Please call to discuss.

## 2019-10-11 NOTE — Progress Notes (Signed)
Pt returned missed phone call. While reviewing pre-op instructions with the patient it was noted that she had held her Xarelto x 2 days. Explained to pt that she would have to call Dr. Thompson Caul office to discuss how he wanted her to proceed for her scheduled Cardioversion. Pt aware that procedure might have to be rescheduled, pt verbalized understanding. Pt states that she has remained in quarantine and is not experiencing and s/s of COVID since her screening. All questions answered appropriately. Jobe Igo, RN

## 2019-10-11 NOTE — Progress Notes (Signed)
Attempted pre op call, no answer, unable to leave voicemail. Jobe Igo, RN

## 2019-10-11 NOTE — Telephone Encounter (Signed)
Pt reports that she stopped her Xarelto yesterday b/c "I have had to stop for all my other procedures". Explained to pt that for this procedure she CANNOT stop the medication and that is why she did not get instructions to stop. Advised to check w/ MD prior to stopping this medication in the future. Pt aware that DCCV for tomorrow will have to be cancelled d/t this and rationale explained.  Pt disappointed to hear this news. Aware that I will discuss with Dr. Curt Bears tomorrow and call her by next week with advisement.  Dr. Curt Bears - do you want me to reschedule this procedure or have pt come in for EKG to determine rhythm?  We started Amiodarone beginning of this month.

## 2019-10-11 NOTE — H&P (View-Only) (Signed)
Pt returned missed phone call. While reviewing pre-op instructions with the patient it was noted that she had held her Xarelto x 2 days. Explained to pt that she would have to call Dr. Thompson Caul office to discuss how he wanted her to proceed for her scheduled Cardioversion. Pt aware that procedure might have to be rescheduled, pt verbalized understanding. Pt states that she has remained in quarantine and is not experiencing and s/s of COVID since her screening. All questions answered appropriately. Jobe Igo, RN

## 2019-10-12 ENCOUNTER — Encounter (HOSPITAL_COMMUNITY): Admission: RE | Payer: Self-pay | Source: Home / Self Care

## 2019-10-12 ENCOUNTER — Ambulatory Visit (HOSPITAL_COMMUNITY): Admission: RE | Admit: 2019-10-12 | Payer: Medicare Other | Source: Home / Self Care | Admitting: Cardiovascular Disease

## 2019-10-12 SURGERY — CARDIOVERSION
Anesthesia: General

## 2019-10-18 ENCOUNTER — Other Ambulatory Visit: Payer: Self-pay | Admitting: Cardiology

## 2019-10-22 ENCOUNTER — Telehealth: Payer: Self-pay

## 2019-10-22 NOTE — Telephone Encounter (Signed)
Follow Up:   Pt would like to know what was decided about her procedure that was postpone please.

## 2019-10-22 NOTE — Telephone Encounter (Signed)
Michele Green, medical assist, informed pt that I would call her this week after discussing w/ Dr. Curt Bears.  Delayed d/t holiday week last week.

## 2019-10-22 NOTE — Telephone Encounter (Signed)
Spoke with pt about clarifying her refill for amiodarone 200, pt asked about a canceled procedure and what was expected to do next. I spoke with Barbera Setters [facility] nurse) and let the pt know she will receive a call from her when Dr. Curt Bears decides what he wants to do next. Pt verbalized her understanding and thanked me for the call.

## 2019-10-26 NOTE — Telephone Encounter (Signed)
Called pt to discuss rescheduling procedure. We discussed coming in for an EKG to determine if need to still arrange procedure. Pt prefers this. Scheduled to come Tuesday for an EKG. Patient verbalized understanding and agreeable to plan.

## 2019-10-30 ENCOUNTER — Ambulatory Visit (INDEPENDENT_AMBULATORY_CARE_PROVIDER_SITE_OTHER): Payer: Medicare Other | Admitting: *Deleted

## 2019-10-30 VITALS — HR 88 | Wt 131.2 lb

## 2019-10-30 DIAGNOSIS — Z01812 Encounter for preprocedural laboratory examination: Secondary | ICD-10-CM

## 2019-10-30 DIAGNOSIS — I4819 Other persistent atrial fibrillation: Secondary | ICD-10-CM

## 2019-10-30 NOTE — Progress Notes (Signed)
1.  Reason for visit: EKG post  Amiodarone start/determine rhythm if DCCV still needed  2.  Name of MD requesting visit:  Cam[place]  3. H&P:  See epic  4.  ROS related to problem:  See epic  5.  Assessment and plan per MD:  EKG showing AFib PR 182 ms, QRS 92 ms, QT/QTc 374/452 ms  Dr. Curt Bears reviewed EKG. Order to reschedule DCCV.

## 2019-10-30 NOTE — Patient Instructions (Addendum)
Medication Instructions:  Your physician recommends that you continue on your current medications as directed. Please refer to the Current Medication list given to you today.  * If you need a refill on your cardiac medications before your next appointment, please call your pharmacy.   Labwork: Today: BMET & CBC If you have labs (blood work) drawn today and your tests are completely normal, you will receive your results only by:  [place] (if you have MyChart) OR  A paper copy in the mail If you have any lab test that is abnormal or we need to change your treatment, we will call you to review the results.  Testing/Procedures: Your physician has recommended that you have a Cardioversion (DCCV). Electrical Cardioversion uses a jolt of electricity to your heart either through paddles or wired patches attached to your chest. This is a controlled, usually prescheduled, procedure. Defibrillation is done under light anesthesia in the hospital, and you usually go home the day of the procedure. This is done to get your heart back into a normal rhythm. You are not awake for the procedure. Please see the instruction below.  Follow-Up: The office will call to arrange follow up   Thank you for choosing [facility]!!   Trinidad Curet, RN [phone]  Any Other Special Instructions Will Be Listed Below (If Applicable).  Your provider has recommended a cardioversion.    COVID TEST-- On 10/30/19 @ 10:30 am -  You will go to [facility] hospital ([place]) for your Covid testing.   This is a drive thru test site.  There will be multiple testing areas.  Be sure to share with the first checkpoint that you are there for pre-procedure/surgery testing. This will put you into the right (yellow) lane that leads to the PAT testing team. Stay in your car and the nurse team will come to your car to test you.  After you are tested please go home and self quarantine until the day of  your procedure.    You are scheduled for a cardioversion on 11/05/19 with Dr. [name] or associates. Please go to [facility], [address] through the [employer] [facility] at 10:30 am Do not have any food or drink after midnight the night before.  Hold your Lasix the morning of this procedure.  You may take your remaining medicines with a sip of water on the day of your procedure.  You will need someone to drive you home following your procedure.   Call the [place] office at [phone] if you have any questions, problems or concerns.      Electrical Cardioversion Electrical cardioversion is the delivery of a jolt of electricity to change the rhythm of the heart. Sticky patches or metal paddles are placed on the chest to deliver the electricity from a device. This is done to restore a normal rhythm. A rhythm that is too fast or not regular keeps the heart from pumping well. Electrical cardioversion is done in an emergency if:   There is low or no blood pressure as a result of the heart rhythm.    Normal rhythm must be restored as fast as possible to protect the brain and heart from further damage.    It may save a life. Cardioversion may be done for heart rhythms that are not immediately life threatening, such as atrial fibrillation or flutter, in which:   The heart is beating too fast or is not regular.  Medicine to change the rhythm has not worked.    It is safe to wait in order to allow time for preparation.  Symptoms of the abnormal rhythm are bothersome.  The risk of stroke and other serious problems can be reduced.  LET [facility] CARE PROVIDER KNOW ABOUT:   Any allergies you have.  All medicines you are taking, including vitamins, herbs, eye drops, creams, and over-the-counter medicines.  Previous problems you or members of your family have had with the use of anesthetics.    Any blood disorders you have.    Previous surgeries you have  had.    Medical conditions you have.   RISKS AND COMPLICATIONS  Generally, this is a safe procedure. However, problems can occur and include:   Breathing problems related to the anesthetic used.  A blood clot that breaks free and travels to other parts of your body. This could cause a stroke or other problems. The risk of this is lowered by use of blood-thinning medicine (anticoagulant) prior to the procedure.  Cardiac arrest (rare).   BEFORE THE PROCEDURE   You may have tests to detect blood clots in your heart and to evaluate heart function.   You may start taking anticoagulants so your blood does not clot as easily.    Medicines may be given to help stabilize your heart rate and rhythm.   PROCEDURE  You will be given medicine through an IV tube to reduce discomfort and make you sleepy (sedative).    An electrical shock will be delivered.   AFTER THE PROCEDURE Your heart rhythm will be watched to make sure it does not change. You will need someone to drive you home.

## 2019-10-31 LAB — BASIC METABOLIC PANEL
BUN/Creatinine Ratio: 22 (ref 12–28)
BUN: 21 mg/dL (ref 8–27)
CO2: 23 mmol/L (ref 20–29)
Calcium: 8.9 mg/dL (ref 8.7–10.3)
Chloride: 103 mmol/L (ref 96–106)
Creatinine, Ser: 0.96 mg/dL (ref 0.57–1.00)
GFR calc Af Amer: 65 mL/min/{1.73_m2} (ref >59)
GFR calc non Af Amer: 56 mL/min/{1.73_m2} — ABNORMAL LOW (ref >59)
Glucose: 83 mg/dL (ref 65–99)
Potassium: 4 mmol/L (ref 3.5–5.2)
Sodium: 140 mmol/L (ref 134–144)

## 2019-10-31 LAB — CBC
Hematocrit: 38.7 % (ref 34.0–46.6)
Hemoglobin: 13 g/dL (ref 11.1–15.9)
MCH: 35.2 pg — ABNORMAL HIGH (ref 26.6–33.0)
MCHC: 33.6 g/dL (ref 31.5–35.7)
MCV: 105 fL — ABNORMAL HIGH (ref 79–97)
Platelets: 120 10*3/uL — ABNORMAL LOW (ref 150–450)
RBC: 3.69 x10E6/uL — ABNORMAL LOW (ref 3.77–5.28)
RDW: 12.4 % (ref 11.7–15.4)
WBC: 8.9 10*3/uL (ref 3.4–10.8)

## 2019-11-02 ENCOUNTER — Other Ambulatory Visit (HOSPITAL_COMMUNITY)
Admission: RE | Admit: 2019-11-02 | Discharge: 2019-11-02 | Disposition: A | Discharge status: Home / Self Care | Payer: Medicare Other | Source: Ambulatory Visit | Attending: Cardiology | Admitting: Cardiology

## 2019-11-02 DIAGNOSIS — Z20828 Contact with and (suspected) exposure to other viral communicable diseases: Secondary | ICD-10-CM | POA: Diagnosis not present

## 2019-11-02 DIAGNOSIS — Z01812 Encounter for preprocedural laboratory examination: Secondary | ICD-10-CM | POA: Insufficient documentation

## 2019-11-02 LAB — SARS CORONAVIRUS 2 (TAT 6-24 HRS): SARS Coronavirus 2: NEGATIVE

## 2019-11-05 ENCOUNTER — Ambulatory Visit (HOSPITAL_COMMUNITY)
Admission: RE | Admit: 2019-11-05 | Discharge: 2019-11-05 | Disposition: A | Discharge status: Home / Self Care | Payer: Medicare Other | Attending: Cardiology | Admitting: Cardiology

## 2019-11-05 ENCOUNTER — Ambulatory Visit (HOSPITAL_COMMUNITY): Payer: Medicare Other | Admitting: Certified Registered Nurse Anesthetist

## 2019-11-05 ENCOUNTER — Other Ambulatory Visit: Payer: Self-pay

## 2019-11-05 ENCOUNTER — Encounter (HOSPITAL_COMMUNITY)
Admission: RE | Disposition: A | Discharge status: Home / Self Care | Payer: Self-pay | Source: Home / Self Care | Attending: Cardiology

## 2019-11-05 ENCOUNTER — Encounter (HOSPITAL_COMMUNITY): Payer: Self-pay | Admitting: Cardiology

## 2019-11-05 DIAGNOSIS — M8580 Other specified disorders of bone density and structure, unspecified site: Secondary | ICD-10-CM | POA: Diagnosis not present

## 2019-11-05 DIAGNOSIS — I2510 Atherosclerotic heart disease of native coronary artery without angina pectoris: Secondary | ICD-10-CM | POA: Diagnosis not present

## 2019-11-05 DIAGNOSIS — I4891 Unspecified atrial fibrillation: Secondary | ICD-10-CM

## 2019-11-05 DIAGNOSIS — I484 Atypical atrial flutter: Secondary | ICD-10-CM | POA: Insufficient documentation

## 2019-11-05 DIAGNOSIS — I10 Essential (primary) hypertension: Secondary | ICD-10-CM | POA: Insufficient documentation

## 2019-11-05 DIAGNOSIS — Z87891 Personal history of nicotine dependence: Secondary | ICD-10-CM | POA: Diagnosis not present

## 2019-11-05 DIAGNOSIS — Z7901 Long term (current) use of anticoagulants: Secondary | ICD-10-CM | POA: Diagnosis not present

## 2019-11-05 DIAGNOSIS — Z79899 Other long term (current) drug therapy: Secondary | ICD-10-CM | POA: Diagnosis not present

## 2019-11-05 DIAGNOSIS — E785 Hyperlipidemia, unspecified: Secondary | ICD-10-CM | POA: Insufficient documentation

## 2019-11-05 DIAGNOSIS — Z951 Presence of aortocoronary bypass graft: Secondary | ICD-10-CM | POA: Insufficient documentation

## 2019-11-05 DIAGNOSIS — Z8249 Family history of ischemic heart disease and other diseases of the circulatory system: Secondary | ICD-10-CM | POA: Insufficient documentation

## 2019-11-05 DIAGNOSIS — I4819 Other persistent atrial fibrillation: Secondary | ICD-10-CM | POA: Diagnosis present

## 2019-11-05 HISTORY — PX: CARDIOVERSION: SHX1299

## 2019-11-05 SURGERY — CARDIOVERSION
Anesthesia: General

## 2019-11-05 MED ORDER — SODIUM CHLORIDE 0.9 % IV SOLN
INTRAVENOUS | Status: DC | PRN
  Administered 2019-11-05: 11:00:00 via INTRAVENOUS

## 2019-11-05 MED ORDER — LIDOCAINE 2% (20 MG/ML) 5 ML SYRINGE
INTRAMUSCULAR | Status: DC | PRN
  Administered 2019-11-05: 60 mg via INTRAVENOUS

## 2019-11-05 MED ORDER — PROPOFOL 10 MG/ML IV BOLUS
INTRAVENOUS | Status: DC | PRN
  Administered 2019-11-05: 60 mg via INTRAVENOUS

## 2019-11-05 NOTE — Discharge Instructions (Signed)

## 2019-11-05 NOTE — Transfer of Care (Signed)
Immediate Anesthesia Transfer of Care Note  Patient: Michele Green  Procedure(s) Performed: CARDIOVERSION (N/A )  Patient Location: Endoscopy Unit  Anesthesia Type:General  Level of Consciousness: drowsy and patient cooperative  Airway & Oxygen Therapy: Patient Spontanous Breathing  Post-op Assessment: Report given to RN, Post -op Vital signs reviewed and stable and Patient moving all extremities X 4  Post vital signs: Reviewed and stable  Last Vitals:  Vitals Value Taken Time  BP    Temp    Pulse    Resp    SpO2      Last Pain:  Vitals:   11/05/19 1045  TempSrc: Oral  PainSc: 0-No pain         Complications: No apparent anesthesia complications

## 2019-11-05 NOTE — Anesthesia Postprocedure Evaluation (Signed)
Anesthesia Post Note  Patient: Michele Green  Procedure(s) Performed: CARDIOVERSION (N/A )     Patient location during evaluation: PACU Anesthesia Type: General Level of consciousness: sedated Pain management: pain level controlled Vital Signs Assessment: post-procedure vital signs reviewed and stable Respiratory status: spontaneous breathing and respiratory function stable Cardiovascular status: stable Postop Assessment: no apparent nausea or vomiting Anesthetic complications: no    Last Vitals:  Vitals:   11/05/19 1140 11/05/19 1142  BP: (!) 111/52 (!) 111/52  Pulse: 64 65  Resp: 15 11  Temp:    SpO2: 99% 99%    Last Pain:  Vitals:   11/05/19 1142  TempSrc:   PainSc: 0-No pain                 Michele Green DANIEL

## 2019-11-05 NOTE — CV Procedure (Signed)
    Cardioversion Note  Michele Green [id number] [birth year]  Procedure: DC Cardioversion Indications: atrial fibrillation  Procedure Details Consent: Obtained Time Out: Verified patient identification, verified procedure, site/side was marked, verified correct patient position, special equipment/implants available, Radiology Safety Procedures followed,  medications/allergies/relevent history reviewed, required imaging and test results available.  Performed  The patient has been on adequate anticoagulation.  The patient received IV propofol 60 mg and iv lidocaine 60 mg administered by anesthesia staff for deep sedation.  Synchronous cardioversion was performed at 120 joules.  The cardioversion was successful.   Complications: No apparent complications Patient did tolerate procedure well.   [name], MD, [facility] 11/05/2019, 11:09 AM

## 2019-11-05 NOTE — Anesthesia Preprocedure Evaluation (Signed)
Anesthesia Evaluation  Patient identified by MRN, date of birth, ID band Patient awake    Reviewed: Allergy & Precautions, NPO status , Patient's Chart, lab work & pertinent test results, reviewed documented beta blocker date and time   Airway Mallampati: II  TM Distance: >3 FB Neck ROM: Full    Dental no notable dental hx. (+) Dental Advisory Given   Pulmonary former smoker,    Pulmonary exam normal breath sounds clear to auscultation       Cardiovascular hypertension, Pt. 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 Normal LV EF, trivial MR. Mild-moderate TR with RV-RA gradient of 37 mmHg. Trivial posterior pericardial effusion.   Neuro/Psych negative neurological ROS  negative psych ROS   GI/Hepatic Neg liver ROS, Ulcerative colitis    Endo/Other  negative endocrine ROS  Renal/GU negative Renal ROS     Musculoskeletal Gout   Abdominal   Peds  Hematology  (+) anemia , INR: 3.1   Anesthesia Other Findings   Reproductive/Obstetrics                             Anesthesia Physical  Anesthesia Plan  ASA: III  Anesthesia Plan: General   Post-op Pain Management:    Induction: Intravenous  PONV Risk Score and Plan: 3 and Propofol infusion and Treatment may vary due to age or medical condition  Airway Management Planned: Mask  Additional Equipment:   Intra-op Plan:   Post-operative Plan:   Informed Consent: I have reviewed the patients History and Physical, chart, labs and discussed the procedure including the risks, benefits and alternatives for the proposed anesthesia with the patient or authorized representative who has indicated his/her understanding and acceptance.     Dental  advisory given  Plan Discussed with: CRNA  Anesthesia Plan Comments:         Anesthesia Quick Evaluation

## 2019-11-05 NOTE — Interval H&P Note (Signed)
History and Physical Interval Note:  11/05/2019 10:37 AM  Michele Green  has presented today for surgery, with the diagnosis of AFIB.  The various methods of treatment have been discussed with the patient and family. After consideration of risks, benefits and other options for treatment, the patient has consented to  Procedure(s): CARDIOVERSION (N/A) as a surgical intervention.  The patient's history has been reviewed, patient examined, no change in status, stable for surgery.  I have reviewed the patient's chart and labs.  Questions were answered to the patient's satisfaction.     [name]

## 2019-12-14 ENCOUNTER — Ambulatory Visit: Payer: Medicare Other | Attending: Internal Medicine

## 2019-12-14 ENCOUNTER — Other Ambulatory Visit: Payer: Self-pay

## 2019-12-14 DIAGNOSIS — Z23 Encounter for immunization: Secondary | ICD-10-CM | POA: Insufficient documentation

## 2019-12-14 NOTE — Progress Notes (Signed)
   Covid-19 Vaccination Clinic  Name:  [name]    MRN: [id number] DOB: [birth year]  12/14/2019  [name] was observed post Covid-19 immunization for 15 minutes without incidence. She was provided with Vaccine Information Sheet and instruction to access the V-Safe system.   [name] was instructed to call 911 with any severe reactions post vaccine: [name] Difficulty breathing  . Swelling of your face and throat  . A fast heartbeat  . A bad rash all over your body  . Dizziness and weakness    Immunizations Administered    Name Date Dose VIS Date Route   Pfizer COVID-19 Vaccine 12/14/2019 10:51 AM 0.3 mL 11/02/2019 Intramuscular   Manufacturer: [place]   Lot: [id number]   [place]: [id number]

## 2020-01-04 ENCOUNTER — Ambulatory Visit: Payer: Medicare Other | Attending: Internal Medicine

## 2020-01-04 DIAGNOSIS — Z23 Encounter for immunization: Secondary | ICD-10-CM | POA: Insufficient documentation

## 2020-01-04 NOTE — Progress Notes (Signed)
   Covid-19 Vaccination Clinic  Name:  MARYPAT KIMMET    MRN: [id number] DOB: 1941-11-01  01/04/2020  Ms. Yono was observed post Covid-19 immunization for 15 minutes without incidence. She was provided with Vaccine Information Sheet and instruction to access the V-Safe system.   Ms. Isley was instructed to call 911 with any severe reactions post vaccine: [name] Difficulty breathing  . Swelling of your face and throat  . A fast heartbeat  . A bad rash all over your body  . Dizziness and weakness    Immunizations Administered    Name Date Dose VIS Date Route   Pfizer COVID-19 Vaccine 01/04/2020  4:28 PM 0.3 mL 11/02/2019 Intramuscular   Manufacturer: [place]   Lot: [id number]   [place]: [id number]

## 2020-01-15 ENCOUNTER — Encounter: Payer: Self-pay | Admitting: Cardiology

## 2020-01-15 ENCOUNTER — Ambulatory Visit: Payer: Medicare Other | Admitting: Cardiology

## 2020-01-15 ENCOUNTER — Other Ambulatory Visit: Payer: Self-pay

## 2020-01-15 VITALS — BP 118/80 | HR 122 | Ht 64.0 in | Wt 133.0 lb

## 2020-01-15 DIAGNOSIS — I484 Atypical atrial flutter: Secondary | ICD-10-CM | POA: Diagnosis not present

## 2020-01-15 MED ORDER — DILTIAZEM HCL ER COATED BEADS 180 MG PO CP24: 180.0000 mg | ORAL_CAPSULE | Freq: Every day | ORAL | 3 refills | Status: DC

## 2020-01-15 MED ORDER — ATORVASTATIN CALCIUM 20 MG PO TABS: 20.0000 mg | ORAL_TABLET | Freq: Every day | ORAL | 3 refills | Status: DC

## 2020-01-15 NOTE — Addendum Note (Signed)
Addended by: Stanton Kidney on: 01/15/2020 11:23 AM   Modules accepted: Orders

## 2020-01-15 NOTE — Patient Instructions (Addendum)
Medication Instructions:  Your physician has recommended you make the following change in your medication:  1. STOP Amiodarone 2. START Diltiazem 180 mg once daily 3. STOP Simvastatin 4. START Atorvastatin 20 mg once daily  *If you need a refill on your cardiac medications before your next appointment, please call your pharmacy*  Lab Work: None ordered If you have labs (blood work) drawn today and your tests are completely normal, you will receive your results only by: [name] MyChart Message (if you have MyChart) OR . A paper copy in the mail If you have any lab test that is abnormal or we need to change your treatment, we will call you to review the results.  Testing/Procedures: None ordered  Follow-Up: At [facility], you and your health needs are our priority.  As part of our continuing mission to provide you with exceptional heart care, we have created designated Provider Care Teams.  These Care Teams include your primary Cardiologist (physician) and Advanced Practice Providers (APPs -  Physician Assistants and Nurse Practitioners) who all work together to provide you with the care you need, when you need it.  Your next appointment:   3 month(s)  The format for your next appointment:   In Person  Provider:   Allegra Lai, MD  Other Instructions  PLease call the office in several week and let Michele Grulke, RN know how your heart rates have been doing.  Diltiazem Oral Tablets What is this medicine? DILTIAZEM (dil TYE a zem) is a calcium channel blocker. It relaxes your blood vessels and decreases the amount of work the heart has to do. It treats and/or prevents chest pain (also called angina). This medicine may be used for other purposes; ask your health care provider or pharmacist if you have questions. COMMON BRAND NAME(S): Cardizem What should I tell my health care provider before I take this medicine? They need to know if you have any of these conditions:  heart  attack  heart disease  irregular heartbeat or rhythm  low blood pressure  an unusual or allergic reaction to diltiazem, other drugs, foods, dyes, or preservatives  pregnant or trying to get pregnant  breast-feeding How should I use this medicine? Take this drug by mouth. Take it as directed on the prescription label at the same time every day. Keep taking it unless your health care provider tells you to stop. Talk to your health care provider about the use of this drug in children. Special care may be needed. Overdosage: If you think you have taken too much of this medicine contact a poison control center or emergency room at once. NOTE: This medicine is only for you. Do not share this medicine with others. What if I miss a dose? If you miss a dose, take it as soon as you can. If it is almost time for your next dose, take only that dose. Do not take double or extra doses. What may interact with this medicine? Do not take this medicine with any of the following:  cisapride  hawthorn  pimozide  ranolazine  red yeast rice This medicine may also interact with the following medications:  buspirone  carbamazepine  cimetidine  cyclosporine  digoxin  local anesthetics or general anesthetics  lovastatin  medicines for anxiety or difficulty sleeping like midazolam and triazolam  medicines for high blood pressure or heart problems  quinidine  rifampin, rifabutin, or rifapentine This list may not describe all possible interactions. Give your health care provider a list of  all the medicines, herbs, non-prescription drugs, or dietary supplements you use. Also tell them if you smoke, drink alcohol, or use illegal drugs. Some items may interact with your medicine. What should I watch for while using this medicine? Visit your health care provider for regular checks on your progress. Check your blood pressure as directed. Ask your health care provider what your blood pressure  should be. Also, find out when you should contact him or her. Do not treat yourself for coughs, colds, or pain while you are using this drug without asking your health care provider for advice. Some drugs may increase your blood pressure. This drug may cause serious skin reactions. They can happen weeks to months after starting the drug. Contact your health care provider right away if you notice fevers or flu-like symptoms with a rash. The rash may be red or purple and then turn into blisters or peeling of the skin. Or, you might notice a red rash with swelling of the face, lips or lymph nodes in your neck or under your arms. You may get drowsy or dizzy. Do not drive, use machinery, or do anything that needs mental alertness until you know how this drug affects you. Do not stand up or sit up quickly, especially if you are an older patient. This reduces the risk of dizzy or fainting spells. What side effects may I notice from receiving this medicine? Side effects that you should report to your doctor or health care provider as soon as possible:  allergic reactions (skin rash, itching or hives; swelling of the face, lips, or tongue)  heart failure (trouble breathing; fast, irregular heartbeat; sudden weight gain; swelling of the ankles, feet, hands; unusually weak or tired)  heartbeat rhythm changes (trouble breathing; chest pain; dizziness; fast, irregular heartbeat; feeling faint or lightheaded, falls)  liver injury (dark yellow or brown urine; general ill feeling or flu-like symptoms; loss of appetite, right upper belly pain; unusually weak or tired, yellowing of the eyes or skin)  low blood pressure (dizziness; feeling faint or lightheaded, falls; unusually weak or tired)  redness, blistering, peeling, or loosening of the skin, including inside the mouth Side effects that usually do not require medical attention (report to your doctor or health care provider if they continue or are  bothersome):  changes in sex drive or performance  depressed mood  headache  sudden weight gain  nausea  sudden weight gain  trouble sleeping This list may not describe all possible side effects. Call your doctor for medical advice about side effects. You may report side effects to FDA at 1-800-FDA-1088. Where should I keep my medicine? Keep out of the reach of children and pets. Store at room temperature between 15 and 30 degrees C (59 and 86 degrees F). Protect from moisture. Keep the container tightly closed. Throw away any unused drug after the expiration date. NOTE: This sheet is a summary. It may not cover all possible information. If you have questions about this medicine, talk to your doctor, pharmacist, or health care provider.  2020 Elsevier/Gold Standard (2019-08-14 18:13:24)

## 2020-01-15 NOTE — Progress Notes (Signed)
Electrophysiology Office Note   Date:  01/15/2020   ID:  Michele Green, DOB Mar 24, 1941, MRN [id number]  PCP:  [name], MD  Cardiologist:  Dr Tamala Julian Primary Electrophysiologist: Dr Curt Bears    No chief complaint on file.    History of Present Illness: Michele Green is a 79 y.o. female who is being seen today for the evaluation of atrial fibrillation at the request of [name], MD. She has a PMH significant for CAD s/p CABG, MR s/p repair, breast cancer, and HTN. She reports that she has had atrial fibrillation for many years and has been fairly asymptomatic. She had a Maze procedure at the time of her CABG and MV repair surgery on 08/10/18. Since then, she has also had a DCCV on 10/05/18 but again went back into rate controlled atrial fibrillation. She is in atrial flutter today. She reports that she continues to feel stronger after surgery. Patient is able to exercise at cardiac rehab without issue.  Today, denies symptoms of palpitations, chest pain, shortness of breath, orthopnea, PND, lower extremity edema, claudication, dizziness, presyncope, syncope, bleeding, or neurologic sequela. The patient is tolerating medications without difficulties.  Since her cardioversion, she has felt well.  She is actually not had much in the way of symptoms from her atrial flutter.  She has a few ECGs in the chart that shows sinus rhythm, but unfortunately her heart rate has gone back up and she is in atrial flutter today.  She is able to do all her daily activities without restriction and is unaware of her arrhythmia.   Past Medical History:  Diagnosis Date  . Arrhythmia   . Atypical atrial flutter ([place]) 08/14/2018   Post-operative  . Breast cancer of upper-outer quadrant of left female breast ([place]) 02/02/2016  . CAD (coronary artery disease) 06/20/2018   LHC 7/19: pLAD 65/90, oD1 90, mLCx 85, OM2 50, oRCA 30, EF 50-55 >> s/p CABG in 9/19 (L-LAD)  . Colon polyp 02/2012  . Dupuytren  contracture    bilateral hands  . Family history of breast cancer   . Hyperlipidemia   . Hypertension   . Mitral regurgitation 11/07/2013   Echo 7/19: Mild LVH, EF 60-65, no RWMA, mod ot severe MR, massive LAE, PASP 33 // TEE 7/19:  Mild conc LVH, EF 60-65, no RWMA, severe MR with mild post leaflet prolapse, massive // s/p MV repair 07/2018  . On continuous oral anticoagulation 08/22/2015   Started on Xarelto 08/12/2015   . Osteopenia   . Persistent atrial fibrillation ([place]) 08/22/2015   Started late August or early September 2016 // s/p Maze procedure 07/2018  . Personal history of radiation therapy   . Radiation Therapy 04/21/16-05/19/16   left breast 47.72 Gy, boosted to 10 Gy  . 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 S/P Maze operation for atrial fibrillation 08/10/2018   Complete bilateral atrial lesion set using bipolar radiofrequency and cryothermy ablation with clipping of LA appendage  . S/P MVR (mitral valve repair) 08/10/2018   Complex valvuloplasty including Gore-tex neochord placement x8, Plication of Lateral Commissure and 36m Sorin Memo 4D Ring Annuloplasty SN# [id number] . [place]   . Wears glasses    Past Surgical History:  Procedure Laterality Date  . BREAST BIOPSY Left 01/28/2016  . BREAST LUMPECTOMY Left 02/23/2016  . BREAST LUMPECTOMY WITH RADIOACTIVE SEED LOCALIZATION Left 02/23/2016   Procedure: BREAST LUMPECTOMY WITH RADIOACTIVE SEED LOCALIZATION;  Surgeon: BExcell Seltzer  MD;  Location: [place];  Service: General;  Laterality: Left;  . CARDIAC CATHETERIZATION    . CARDIOVERSION N/A 10/02/2015   Procedure: CARDIOVERSION;  Surgeon: Jerline Pain, MD;  Location: [facility] ENDOSCOPY;  Service: Cardiovascular;  Laterality: N/A;  . CARDIOVERSION N/A 10/05/2018   Procedure: CARDIOVERSION;  Surgeon: Fay Records, MD;  Location: [facility] ENDOSCOPY;  Service: Cardiovascular;  Laterality: N/A;  . CARDIOVERSION N/A 11/05/2019   Procedure:  CARDIOVERSION;  Surgeon: Dorothy Spark, MD;  Location: [place];  Service: Cardiovascular;  Laterality: N/A;  . CLIPPING OF ATRIAL APPENDAGE  08/10/2018   Procedure: CLIPPING OF ATRIAL APPENDAGE;  Surgeon: Rexene Alberts, MD;  Location: [place];  Service: Open Heart Surgery;;  . COLONOSCOPY    . CORONARY ARTERY BYPASS GRAFT N/A 08/10/2018   Procedure: CORONARY ARTERY BYPASS GRAFTING (CABG) x 1, LIMA-LAD,  USING LEFT INTERNAL MAMMARY ARTERY. HARVESTED RIGHT GREATER SAPHENOUS VEIN ENDOSCOPICALLY;  Surgeon: Rexene Alberts, MD;  Location: [place];  Service: Open Heart Surgery;  Laterality: N/A;  . CYSTOSCOPY W/ RETROGRADES Bilateral 06/20/2019   Procedure: CYSTOSCOPY WITH RETROGRADE PYELOGRAM;  Surgeon: Ardis Hughs, MD;  Location: [facility];  Service: Urology;  Laterality: Bilateral;  . CYSTOSCOPY WITH HYDRODISTENSION AND BIOPSY N/A 06/20/2019   Procedure: CYSTOSCOPY BLADDER  BIOPSY WITH FULGERATION;  Surgeon: Ardis Hughs, MD;  Location: [facility];  Service: Urology;  Laterality: N/A;  . DUPUYTREN CONTRACTURE RELEASE  2001   leftx2  . [place]   right  . DUPUYTREN CONTRACTURE RELEASE Right 05/02/2014   Procedure: EXCISION DUPUYTRENS RIGHT PALMAR/SMALL ;  Surgeon: Cammie Sickle, MD;  Location: [place];  Service: Orthopedics;  Laterality: Right;  [name] MAZE N/A 08/10/2018   Procedure: MAZE;  Surgeon: Rexene Alberts, MD;  Location: [place];  Service: Open Heart Surgery;  Laterality: N/A;  . MITRAL VALVE REPAIR N/A 08/10/2018   Procedure: MITRAL VALVE REPAIR (MVR);  Surgeon: Rexene Alberts, MD;  Location: [place];  Service: Open Heart Surgery;  Laterality: N/A;  glutaraldehyde  . RIGHT/LEFT HEART CATH AND CORONARY ANGIOGRAPHY N/A 06/20/2018   Procedure: RIGHT/LEFT HEART CATH AND CORONARY ANGIOGRAPHY;  Surgeon: [name], MD;  Location: [place] CV LAB;  Service: Cardiovascular;  Laterality: N/A;  .  TEE WITHOUT CARDIOVERSION N/A 06/20/2018   Procedure: TRANSESOPHAGEAL ECHOCARDIOGRAM (TEE);  Surgeon: Pixie Casino, MD;  Location: [facility] ENDOSCOPY;  Service: Cardiovascular;  Laterality: N/A;  . TEE WITHOUT CARDIOVERSION N/A 08/10/2018   Procedure: TRANSESOPHAGEAL ECHOCARDIOGRAM (TEE);  Surgeon: Rexene Alberts, MD;  Location: [place];  Service: Open Heart Surgery;  Laterality: N/A;  . TONSILLECTOMY  age 45     Current Outpatient Medications  Medication Sig Dispense Refill  . acetaminophen (TYLENOL) 500 MG tablet Take 500-1,000 mg by mouth every 6 (six) hours as needed for moderate pain or headache.    . allopurinol (ZYLOPRIM) 100 MG tablet Take 200 mg by mouth every evening.     [name] amiodarone (PACERONE) 200 MG tablet Take 200 mg by mouth daily.    . furosemide (LASIX) 20 MG tablet Take 20 mg by mouth daily as needed (for swelling/fluid retention.).     [name] KLOR-CON M20 20 MEQ tablet TAKE 1 TABLET BY MOUTH EVERY DAY (Patient taking differently: Take 20 mEq by mouth daily. ) 90 tablet 3  . mesalamine (APRISO) 0.375 g 24 hr capsule Take 1.5 g by mouth daily.     [name]  metoprolol tartrate (LOPRESSOR) 100 MG tablet Take 1 tablet (100 mg total) by mouth 2 (two) times daily. 180 tablet 3  . phenazopyridine (PYRIDIUM) 200 MG tablet Take 1 tablet (200 mg total) by mouth 3 (three) times daily as needed for pain. 10 tablet 0  . simvastatin (ZOCOR) 20 MG tablet Take 20 mg by mouth every evening.     . tamoxifen (NOLVADEX) 20 MG tablet TAKE 1 TABLET BY MOUTH EVERY DAY (Patient taking differently: Take 20 mg by mouth daily. ) 90 tablet 1  . traMADol (ULTRAM) 50 MG tablet Take 1-2 tablets (50-100 mg total) by mouth every 6 (six) hours as needed for moderate pain. 15 tablet 0  . XARELTO 15 MG TABS tablet TAKE 1 TABLET BY MOUTH  DAILY WITH SUPPER (Patient taking differently: Take 15 mg by mouth at bedtime. ) 90 tablet 2   No current facility-administered medications for this visit.    Allergies:   Shellfish allergy     Social History:  The patient  reports that she quit smoking about 27 years ago. Her smoking use included cigarettes. She has a 20.00 pack-year smoking history. She has never used smokeless tobacco. She reports current alcohol use of about 3.0 standard drinks of alcohol per week. She reports that she does not use drugs.   Family History:  The patient's family history includes Breast cancer in her sister; Breast cancer (age of onset: 25) in her mother; Cirrhosis in her mother; Heart attack in her father; Heart failure in her father; Kidney Stones in her sister.    ROS:  Please see the history of present illness.   Otherwise, review of systems is positive for none.   All other systems are reviewed and negative.   PHYSICAL EXAM: VS:  BP 118/80   Pulse (!) 122   Ht 5' 4"  (1.626 m)   Wt 133 lb (60.3 kg)   SpO2 98%   BMI 22.83 kg/m  , BMI Body mass index is 22.83 kg/m. GEN: Well nourished, well developed, in no acute distress  HEENT: normal  Neck: no JVD, carotid bruits, or masses Cardiac: Tachycardic, regular; no murmurs, rubs, or gallops,no edema  Respiratory:  clear to auscultation bilaterally, normal work of breathing GI: soft, nontender, nondistended, + BS MS: no deformity or atrophy  Skin: warm and dry Neuro:  Strength and sensation are intact Psych: euthymic mood, full affect  EKG:  EKG is ordered today. Personal review of the ekg ordered shows atrial flutter, rate 122   Recent Labs: 08/01/2019: ALT 10 09/25/2019: TSH 0.882 10/30/2019: BUN 21; Creatinine, Ser 0.96; Hemoglobin 13.0; Platelets 120; Potassium 4.0; Sodium 140    Lipid Panel  No results found for: CHOL, TRIG, HDL, CHOLHDL, VLDL, LDLCALC, LDLDIRECT   Wt Readings from Last 3 Encounters:  01/15/20 133 lb (60.3 kg)  11/05/19 127 lb (57.6 kg)  10/30/19 131 lb 3.2 oz (59.5 kg)      Other studies Reviewed: Additional studies/ records that were reviewed today include: TTE 09/05/2019 Review of the above records  today demonstrates:   1. Left ventricular ejection fraction, by visual estimation, is 60 to 65%. The left ventricle has normal function. There is moderately increased left ventricular hypertrophy.  2. Global right ventricle has moderately reduced systolic function.The right ventricular size is normal. No increase in right ventricular wall thickness.  3. Left atrial size was severely dilated.  4. Right atrial size was normal.  5. The mitral valve has been repaired/replaced. Mild mitral valve regurgitation.  Mild-moderate mitral stenosis.  6. MV peak gradient, 19.4 mmHg. HR 117 during this study.  7. The tricuspid valve is grossly normal. Tricuspid valve regurgitation is mild.  8. The aortic valve is tricuspid Aortic valve regurgitation was not visualized by color flow Doppler.  9. The pulmonic valve was grossly normal. Pulmonic valve regurgitation is trivial by color flow Doppler. 10. Moderately elevated pulmonary artery systolic pressure. 11. The inferior vena cava is dilated in size with <50% respiratory variability, suggesting right atrial pressure of 15 mmHg.   ASSESSMENT AND PLAN:  1.  Persistent atrial fibrillation Status post maze 08/10/2018 with cardioversion 10/05/2018.  Left atrium is severely dilated.  Currently on amiodarone, metoprolol, Xarelto.  CHA2DS2-VASc of 5.  Unfortunately she has gone back into atrial flutter.  I do think that rhythm control would be quite difficult.  Due to that we Teya Otterson stop her amiodarone and have a rate control strategy.  We Jeidy Hoerner start her on diltiazem 180 mg.   2. CAD Status post CABG x1.  No current chest pain.  3. HTN Currently well controlled  4. HLD Continue statin  5.  Atypical atrial flutter: Atrium is severely dilated.  Status post cardioversion 11/05/2019.  Unfortunately she has gone back into atrial flutter.  We Brogan Martis plan for a rate control strategy and stop amiodarone.  Current medicines are reviewed at length with the patient today.     The patient does not have concerns regarding her medicines.  The following changes were made today: Stop amiodarone, start diltiazem 180 mg  Labs/ tests ordered today include:  Orders Placed This Encounter  Procedures  . EKG 12-Lead    Disposition:   FU with Finnbar Cedillos 3 months.   Signed, Allexus Ovens Meredith Leeds, MD  01/15/2020 11:08 AM     [facility] [address] [place] [address] [phone] (office) [phone] (fax)

## 2020-01-22 ENCOUNTER — Telehealth: Payer: Self-pay | Admitting: Hematology

## 2020-01-22 NOTE — Telephone Encounter (Signed)
Changed OV to telephone visit per 3/2 sch msg. Pt is aware of appt date and time.

## 2020-01-22 NOTE — Telephone Encounter (Signed)
Moved appt to [place] due to feng being on call.  Spoke with pt and she is aware of the new appt time.

## 2020-01-23 ENCOUNTER — Other Ambulatory Visit: Payer: Self-pay | Admitting: Nurse Practitioner

## 2020-01-23 DIAGNOSIS — C50412 Malignant neoplasm of upper-outer quadrant of left female breast: Secondary | ICD-10-CM

## 2020-01-23 NOTE — Progress Notes (Signed)
Added Mg to routine labs CMP/CBC per cardiology and pharmacy, as patient is on amiodarone

## 2020-01-30 ENCOUNTER — Inpatient Hospital Stay: Payer: Medicare Other | Attending: Nurse Practitioner | Admitting: Nurse Practitioner

## 2020-01-30 ENCOUNTER — Ambulatory Visit: Payer: Medicare Other | Admitting: Hematology

## 2020-01-30 ENCOUNTER — Other Ambulatory Visit: Payer: Medicare Other

## 2020-01-30 ENCOUNTER — Encounter: Payer: Self-pay | Admitting: Nurse Practitioner

## 2020-01-30 DIAGNOSIS — C50412 Malignant neoplasm of upper-outer quadrant of left female breast: Secondary | ICD-10-CM | POA: Diagnosis not present

## 2020-01-30 DIAGNOSIS — Z170 Estrogen receptor positive status [ER+]: Secondary | ICD-10-CM | POA: Diagnosis not present

## 2020-01-30 NOTE — Progress Notes (Signed)
[place]   Telephone:([phone]) [phone] Fax:([phone]) [phone]   Clinic Follow up Note   Patient Care Team: [name], MD as PCP - General (Family Medicine) [name], MD as PCP - Cardiology (Cardiology) Constance Haw, MD as PCP - Electrophysiology (Cardiology) 01/30/2020  I connected with Michele Green on 01/30/20 at 3:10 PM by telephone visit and verified that I am speaking with the correct person using two identifiers.   I discussed the limitations, risks, security and privacy concerns of performing an evaluation and management service by telemedicine and the availability of in-person appointments. I also discussed with the patient that there may be a patient responsible charge related to this service. The patient expressed understanding and agreed to proceed.   Other persons participating in the visit and their role in the encounter: None  Patient's location: Home Provider's location: [place]: CC: F/u stage IA left breast cancer ER+/PR+/HER2- diagnosed in 01/2016, s/p lumpectomy and adjuvant radiation   SUMMARY OF ONCOLOGIC HISTORY: Oncology History Overview Note  Breast cancer of upper-outer quadrant of left female breast [facility])   Staging form: Breast, AJCC 7th Edition   - Clinical stage from 01/28/2016: Stage IA (T1a, N0, M0) - Signed by Truitt Merle, MD on 02/03/2016   - Pathologic stage from 02/23/2016: Stage IA (T1b, N0, cM0) - Signed by Truitt Merle, MD on 06/14/2016      Breast cancer of upper-outer quadrant of left female breast ([place])  01/14/2016 Mammogram   Screening mammogram showed possible mass with distortion in the left breast.   01/23/2016 Imaging   Diagnostic mammogram and ultrasound of the left breast showed a 3-4 mm shadowing mass at 1:00 in the left breast, no adenopathy.   01/28/2016 Initial Diagnosis   Breast cancer of upper-outer quadrant of left female breast ([place])   01/28/2016 Initial Biopsy   Left breast mass biopsy  showed invasive ductal carcinoma, grade 1.   01/28/2016 Receptors her2   Your 100% positive, PR 100% positive, HER-2 negative, Ki-67 5%   02/23/2016 Surgery   Left breast lumpectomy, and sentinel lymph node biopsy   02/23/2016 Pathology Results   Left breast lumpectomy showed a 1.0 cm invasive ductal carcinoma, grade 1, no lymphvascular invasion, invasive carcinoma is broadly less than 0.1 cm to the anterior inferior margin, ADH, ALH, 1 node(-). Left inferior reexcision margin showed LCIS    04/21/2016 - 05/19/2016 Radiation Therapy   adjuvant breast radiation    06/10/2016 -  Anti-estrogen oral therapy   Anastrozole 1 mg daily.  Changed to Tamoxifen due to osteoporosis on 09/30/17, which was held in Sep 2019 for her open heart surgery and restarted in 10/2018     02/03/2017 Mammogram   Bilateral diagnostic mammogram 02/03/17 IMPRESSION: Expected surgical changes in the upper-outer quadrant of the left breast. No mammographic evidence of malignancy in the bilateral breasts.   02/20/2018 Mammogram   02/20/2018 Mammogram IMPRESSION: No evidence of malignancy within either breast. Stable postsurgical changes within the left breast.   03/09/2019 Mammogram   IMPRESSION: No mammographic evidence of malignancy.   RECOMMENDATION: Annual diagnostic mammography.     CURRENT THERAPY: Tamoxifen   INTERVAL HISTORY:I called Michele Green today for our scheduled virtual f/u, she identified herself by name and DOB. She is doing well, continues tamoxifen with occasional tolerable hot flash. Denies changes in her breasts such as new lump/mass or nipple discharge. Denies new bone/joint pain. Denies change in appetite, energy level, bowel habits. No  recent infection, cough, chest pain, dyspnea. Denies bleeding. Denies swelling in her legs.   MEDICAL HISTORY:  Past Medical History:  Diagnosis Date  . Arrhythmia   . Atypical atrial flutter ([place]) 08/14/2018   Post-operative  . Breast cancer of upper-outer  quadrant of left female breast ([place]) 02/02/2016  . CAD (coronary artery disease) 06/20/2018   LHC 7/19: pLAD 65/90, oD1 90, mLCx 85, OM2 50, oRCA 30, EF 50-55 >> s/p CABG in 9/19 (L-LAD)  . Colon polyp 02/2012  . Dupuytren contracture    bilateral hands  . Family history of breast cancer   . Hyperlipidemia   . Hypertension   . Mitral regurgitation 11/07/2013   Echo 7/19: Mild LVH, EF 60-65, no RWMA, mod ot severe MR, massive LAE, PASP 33 // TEE 7/19:  Mild conc LVH, EF 60-65, no RWMA, severe MR with mild post leaflet prolapse, massive // s/p MV repair 07/2018  . On continuous oral anticoagulation 08/22/2015   Started on Xarelto 08/12/2015   . Osteopenia   . Persistent atrial fibrillation ([facility]) 08/22/2015   Started late August or early September 2016 // s/p Maze procedure 07/2018  . Personal history of radiation therapy   . Radiation Therapy 04/21/16-05/19/16   left breast 47.72 Gy, boosted to 10 Gy  . 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 S/P Maze operation for atrial fibrillation 08/10/2018   Complete bilateral atrial lesion set using bipolar radiofrequency and cryothermy ablation with clipping of LA appendage  . S/P MVR (mitral valve repair) 08/10/2018   Complex valvuloplasty including Gore-tex neochord placement x8, Plication of Lateral Commissure and 60m Sorin Memo 4D Ring Annuloplasty SN# [id number] . [place]   . Wears glasses     SURGICAL HISTORY: Past Surgical History:  Procedure Laterality Date  . BREAST BIOPSY Left 01/28/2016  . BREAST LUMPECTOMY Left 02/23/2016  . BREAST LUMPECTOMY WITH RADIOACTIVE SEED LOCALIZATION Left 02/23/2016   Procedure: BREAST LUMPECTOMY WITH RADIOACTIVE SEED LOCALIZATION;  Surgeon: BExcell Seltzer MD;  [place]  Service: General;  Laterality: Left;  . CARDIAC CATHETERIZATION    . CARDIOVERSION N/A 10/02/2015   Procedure: CARDIOVERSION;  Surgeon: MJerline Pain MD;  [facility];  Service:  Cardiovascular;  Laterality: N/A;  . CARDIOVERSION N/A 10/05/2018   Procedure: CARDIOVERSION;  Surgeon: RFay Records MD;  [facility];  Service: Cardiovascular;  Laterality: N/A;  . CARDIOVERSION N/A 11/05/2019   Procedure: CARDIOVERSION;  Surgeon: NDorothy Spark MD;  [place]  Service: Cardiovascular;  Laterality: N/A;  . CLIPPING OF ATRIAL APPENDAGE  08/10/2018   Procedure: CLIPPING OF ATRIAL APPENDAGE;  Surgeon: ORexene Alberts MD;  [place] [facility]  Service: Open Heart Surgery;;  . COLONOSCOPY    . CORONARY ARTERY BYPASS GRAFT N/A 08/10/2018   Procedure: CORONARY ARTERY BYPASS GRAFTING (CABG) x 1, LIMA-LAD,  USING LEFT INTERNAL MAMMARY ARTERY. HARVESTED RIGHT GREATER SAPHENOUS VEIN ENDOSCOPICALLY;  Surgeon: ORexene Alberts MD;  [place]  Service: Open Heart Surgery;  Laterality: N/A;  . CYSTOSCOPY W/ RETROGRADES Bilateral 06/20/2019   Procedure: CYSTOSCOPY WITH RETROGRADE PYELOGRAM;  Surgeon: HArdis Hughs MD;  [facility]  Service: Urology;  Laterality: Bilateral;  . CYSTOSCOPY WITH HYDRODISTENSION AND BIOPSY N/A 06/20/2019   Procedure: CYSTOSCOPY BLADDER  BIOPSY WITH FULGERATION;  Surgeon: HArdis Hughs MD;  [facility]  Service: Urology;  Laterality: N/A;  . DUPUYTREN CONTRACTURE RELEASE  2001   leftx2  . [place]   right  . DUPUYTREN CONTRACTURE RELEASE Right 05/02/2014   Procedure: EXCISION DUPUYTRENS RIGHT PALMAR/SMALL ;  Surgeon: Cammie Sickle, MD;  Location: [place];  Service: Orthopedics;  Laterality: Right;  [name] MAZE N/A 08/10/2018   Procedure: MAZE;  Surgeon: Rexene Alberts, MD;  Location: [place];  Service: Open Heart Surgery;  Laterality: N/A;  . MITRAL VALVE REPAIR N/A 08/10/2018   Procedure: MITRAL VALVE REPAIR (MVR);  Surgeon: Rexene Alberts, MD;  Location: [place];  Service: Open Heart Surgery;  Laterality: N/A;   glutaraldehyde  . RIGHT/LEFT HEART CATH AND CORONARY ANGIOGRAPHY N/A 06/20/2018   Procedure: RIGHT/LEFT HEART CATH AND CORONARY ANGIOGRAPHY;  Surgeon: [name], MD;  Location: [place] [facility];  Service: Cardiovascular;  Laterality: N/A;  . TEE WITHOUT CARDIOVERSION N/A 06/20/2018   Procedure: TRANSESOPHAGEAL ECHOCARDIOGRAM (TEE);  Surgeon: Pixie Casino, MD;  [facility];  Service: Cardiovascular;  Laterality: N/A;  . TEE WITHOUT CARDIOVERSION N/A 08/10/2018   Procedure: TRANSESOPHAGEAL ECHOCARDIOGRAM (TEE);  Surgeon: Rexene Alberts, MD;  Location: [place];  Service: Open Heart Surgery;  Laterality: N/A;  . TONSILLECTOMY  age 79    I have reviewed the social history and family history with the patient and they are unchanged from previous note.  ALLERGIES:  is allergic to shellfish allergy.  MEDICATIONS:  Current Outpatient Medications  Medication Sig Dispense Refill  . acetaminophen (TYLENOL) 500 MG tablet Take 500-1,000 mg by mouth every 6 (six) hours as needed for moderate pain or headache.    . allopurinol (ZYLOPRIM) 100 MG tablet Take 200 mg by mouth every evening.     [name] amiodarone (PACERONE) 200 MG tablet Take 200 mg by mouth daily.    [name] atorvastatin (LIPITOR) 20 MG tablet Take 1 tablet (20 mg total) by mouth daily. 30 tablet 3  . diltiazem (CARDIZEM CD) 180 MG 24 hr capsule Take 1 capsule (180 mg total) by mouth daily. 30 capsule 3  . furosemide (LASIX) 20 MG tablet Take 20 mg by mouth daily as needed (for swelling/fluid retention.).     [name] KLOR-CON M20 20 MEQ tablet TAKE 1 TABLET BY MOUTH EVERY DAY (Patient taking differently: Take 20 mEq by mouth daily. ) 90 tablet 3  . mesalamine (APRISO) 0.375 g 24 hr capsule Take 1.5 g by mouth daily.     . metoprolol tartrate (LOPRESSOR) 100 MG tablet Take 1 tablet (100 mg total) by mouth 2 (two) times daily. 180 tablet 3  . phenazopyridine (PYRIDIUM) 200 MG tablet Take 1 tablet (200 mg total) by mouth 3 (three) times daily as  needed for pain. 10 tablet 0  . tamoxifen (NOLVADEX) 20 MG tablet TAKE 1 TABLET BY MOUTH EVERY DAY (Patient taking differently: Take 20 mg by mouth daily. ) 90 tablet 1  . traMADol (ULTRAM) 50 MG tablet Take 1-2 tablets (50-100 mg total) by mouth every 6 (six) hours as needed for moderate pain. 15 tablet 0  . XARELTO 15 MG TABS tablet TAKE 1 TABLET BY MOUTH  DAILY WITH SUPPER (Patient taking differently: Take 15 mg by mouth at bedtime. ) 90 tablet 2   No current facility-administered medications for this visit.    PHYSICAL EXAMINATION:  There were no vitals filed for this visit. There were no vitals filed for this visit.  Patient appeared well over the phone. Speech clear and intact. No cough or conversational dyspnea.  LABORATORY DATA:  No labs for today's visit   RADIOGRAPHIC STUDIES: No results found.   ASSESSMENT & PLAN: Michele Green a 79 y.o.femalewith   1. Breast cancer of upper-outer quadrant of left female breast, G1 invasive ductal carcinoma, pT1bN0M0, stage IA, ER+/PR+/HER2- -Diagnosed in 01/2016. S/p left breast lumpectomy and adjuvant radiation.  -She started anti-estrogen therapywith anastrozole in 05/2016. Due to osteoporosis she was switched to Tamoxifen therapy. Plan to complete in summer 2022.  -mammogram in 02/2019 negative for malignancy  -CT in 05/2019 ordered by GYN for urinary issues showed a thickened endometrial stripe. She reports bleeding/discharge after bladder injections which is not abnormal for her. Given the risk of endometrial cancer on tamoxifen, I previously sent a message to Dr. Julien Girt to determine if she needs further gyn f/u. I did not hear back. She denies further bleeding.  -continue surveillance   2. Osteoporosis  -She had spinal fracture after a mild fall in 2018, she recovered well. -07/15/17 bone density shows osteoporosis with the lowest T-Score in the left femur neck, -3.3, which has definitely gotten worse since 2014, when her T  score was -2.2 -continueProliainjections per Dr. Brigitte Pulse.  -Not interested in repeating DEXA, she will f/u with Dr. Brigitte Pulse  -Not discussed today  3. HTN,CAD,AF, mitral regurgitation  -F/u with her primary care physician and cardiologist. -She iss/p CABGx1,mitral valve repair and maze operation for atrial fibrillationon9/19/2019.  -on Xarelto  4. Geneticstesting was negative  5. Macrocytic anemia  -Developed anemia after her open heart surgery, and had macrocytosis even before her open heart surgery -she is on P10 and folic acid supplements -not discussed today   Disposition:  Michele Green appears well. She appeared in a hurry to end today's call. I recommend to continue breast cancer surveillance, tamoxifen, and call breast center to schedule annual mammogram due in 02/2020. I placed the order today. I recommend for her to return for lab and breast exam in 3 months. She knows to call us if she has any new concerns or questions.    Orders Placed This Encounter  Procedures  . MM DIAG BREAST TOMO BILATERAL    Standing Status:   Future    Standing Expiration Date:   01/29/2021    Order Specific Question:   Reason for Exam (SYMPTOM  OR DIAGNOSIS REQUIRED)    Answer:   h/o left breast cancer, s/p lumpectomy and radiation    Order Specific Question:   Preferred imaging location?    Answer:   [facility]   I spent 6 minutes non-face-to-face time on today's call. No barriers to learning were detected. I did not feel she needed in-person evaluation at this time.     Alla Feeling, NP 01/30/20

## 2020-01-31 ENCOUNTER — Telehealth: Payer: Self-pay | Admitting: Nurse Practitioner

## 2020-01-31 NOTE — Telephone Encounter (Signed)
Scheduled appt per 3/10 los.  Sent a message to HIM pool to get a calendar mailed out.

## 2020-02-11 ENCOUNTER — Ambulatory Visit: Payer: Medicare Other | Admitting: Interventional Cardiology

## 2020-02-11 ENCOUNTER — Encounter: Payer: Self-pay | Admitting: Interventional Cardiology

## 2020-02-11 ENCOUNTER — Other Ambulatory Visit: Payer: Self-pay

## 2020-02-11 VITALS — BP 94/54 | HR 84 | Ht 64.0 in | Wt 129.8 lb

## 2020-02-11 DIAGNOSIS — I10 Essential (primary) hypertension: Secondary | ICD-10-CM | POA: Diagnosis not present

## 2020-02-11 DIAGNOSIS — I5032 Chronic diastolic (congestive) heart failure: Secondary | ICD-10-CM

## 2020-02-11 DIAGNOSIS — I484 Atypical atrial flutter: Secondary | ICD-10-CM | POA: Diagnosis not present

## 2020-02-11 DIAGNOSIS — Z7901 Long term (current) use of anticoagulants: Secondary | ICD-10-CM

## 2020-02-11 DIAGNOSIS — Z9889 Other specified postprocedural states: Secondary | ICD-10-CM

## 2020-02-11 DIAGNOSIS — Z7189 Other specified counseling: Secondary | ICD-10-CM

## 2020-02-11 DIAGNOSIS — I4819 Other persistent atrial fibrillation: Secondary | ICD-10-CM | POA: Diagnosis not present

## 2020-02-11 DIAGNOSIS — Z951 Presence of aortocoronary bypass graft: Secondary | ICD-10-CM

## 2020-02-11 NOTE — Patient Instructions (Signed)
Medication Instructions:  Your physician recommends that you continue on your current medications as directed. Please refer to the Current Medication list given to you today.  *If you need a refill on your cardiac medications before your next appointment, please call your pharmacy*   Lab Work: None If you have labs (blood work) drawn today and your tests are completely normal, you will receive your results only by: [name] MyChart Message (if you have MyChart) OR . A paper copy in the mail If you have any lab test that is abnormal or we need to change your treatment, we will call you to review the results.   Testing/Procedures: None   Follow-Up: At [facility], you and your health needs are our priority.  As part of our continuing mission to provide you with exceptional heart care, we have created designated Provider Care Teams.  These Care Teams include your primary Cardiologist (physician) and Advanced Practice Providers (APPs -  Physician Assistants and Nurse Practitioners) who all work together to provide you with the care you need, when you need it.  We recommend signing up for the patient portal called "MyChart".  Sign up information is provided on this After Visit Summary.  MyChart is used to connect with patients for Virtual Visits (Telemedicine).  Patients are able to view lab/test results, encounter notes, upcoming appointments, etc.  Non-urgent messages can be sent to your provider as well.   To learn more about what you can do with MyChart, go to [email or web address].    Your next appointment:   6 month(s)  The format for your next appointment:   In Person  Provider:   You may see Sinclair Grooms, MD or one of the following Advanced Practice Providers on your designated Care Team:    Truitt Merle, NP  Cecilie Kicks, NP  Kathyrn Drown, NP    Other Instructions

## 2020-02-11 NOTE — Progress Notes (Signed)
Cardiology Office Note:    Date:  02/11/2020   ID:  Michele Green, DOB 02-17-1941, MRN [id number]  PCP:  [name], MD  Cardiologist:  Sinclair Grooms, MD   Referring MD: [name], MD   Chief Complaint  Patient presents with  . Atrial Fibrillation    History of Present Illness:    Michele Green is a 79 y.o. female with a hx of CAD s/p CABG xi [place]--> LAD and severeMR s/p repairSeptember 2019, breast cancer, HTN, history of atrial fibrillation prior to open heart surgery, Maze procedure with out atrial appendage ligation September 2019, recurrent A. fib and a flutter post surgery despite amiodarone therapy and cardioversion November 2019.  Decision made by Dr. Curt Bears in March 2021 to pursue rate control rather than any further attempts at rhythm control.  On a rate control strategy rather than rhythm control as noted above.  She feels okay.  Diltiazem was most recently added and amiodarone discontinued.  Blood pressure is relatively low.  She is not short of breath, denies orthopnea and PND, and is not using Lasix.  No angina or significant palpitations.  Past Medical History:  Diagnosis Date  . Arrhythmia   . Atypical atrial flutter ([place]) 08/14/2018   Post-operative  . Breast cancer of upper-outer quadrant of left female breast ([place]) 02/02/2016  . CAD (coronary artery disease) 06/20/2018   LHC 7/19: pLAD 65/90, oD1 90, mLCx 85, OM2 50, oRCA 30, EF 50-55 >> s/p CABG in 9/19 (L-LAD)  . Colon polyp 02/2012  . Dupuytren contracture    bilateral hands  . Family history of breast cancer   . Hyperlipidemia   . Hypertension   . Mitral regurgitation 11/07/2013   Echo 7/19: Mild LVH, EF 60-65, no RWMA, mod ot severe MR, massive LAE, PASP 33 // TEE 7/19:  Mild conc LVH, EF 60-65, no RWMA, severe MR with mild post leaflet prolapse, massive // s/p MV repair 07/2018  . On continuous oral anticoagulation 08/22/2015   Started on Xarelto 08/12/2015   . Osteopenia   .  Persistent atrial fibrillation ([place]) 08/22/2015   Started late August or early September 2016 // s/p Maze procedure 07/2018  . Personal history of radiation therapy   . Radiation Therapy 04/21/16-05/19/16   left breast 47.72 Gy, boosted to 10 Gy  . 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 S/P Maze operation for atrial fibrillation 08/10/2018   Complete bilateral atrial lesion set using bipolar radiofrequency and cryothermy ablation with clipping of LA appendage  . S/P MVR (mitral valve repair) 08/10/2018   Complex valvuloplasty including Gore-tex neochord placement x8, Plication of Lateral Commissure and 63m Sorin Memo 4D Ring Annuloplasty SN# [id number] . [place]cerative [place] ([place]   . Wears glasses     Past Surgical History:  Procedure Laterality Date  . BREAST BIOPSY Left 01/28/2016  . BREAST LUMPECTOMY Left 02/23/2016  . BREAST LUMPECTOMY WITH RADIOACTIVE SEED LOCALIZATION Left 02/23/2016   Procedure: BREAST LUMPECTOMY WITH RADIOACTIVE SEED LOCALIZATION;  Surgeon: BExcell Seltzer MD;  [facility] [facility]  Service: General;  Laterality: Left;  . CARDIAC CATHETERIZATION    . CARDIOVERSION N/A 10/02/2015   Procedure: CARDIOVERSION;  Surgeon: MJerline Pain MD;  [facility];  Service: Cardiovascular;  Laterality: N/A;  . CARDIOVERSION N/A 10/05/2018   Procedure: CARDIOVERSION;  Surgeon: RFay Records MD;  [facility];  Service: Cardiovascular;  Laterality: N/A;  . CARDIOVERSION N/A 11/05/2019  Procedure: CARDIOVERSION;  Surgeon: Dorothy Spark, MD;  Location: [place];  Service: Cardiovascular;  Laterality: N/A;  . CLIPPING OF ATRIAL APPENDAGE  08/10/2018   Procedure: CLIPPING OF ATRIAL APPENDAGE;  Surgeon: Rexene Alberts, MD;  Location: [place];  Service: Open Heart Surgery;;  . COLONOSCOPY    . CORONARY ARTERY BYPASS GRAFT N/A 08/10/2018   Procedure: CORONARY ARTERY BYPASS GRAFTING (CABG) x 1, LIMA-LAD,  USING LEFT INTERNAL MAMMARY ARTERY.  HARVESTED RIGHT GREATER SAPHENOUS VEIN ENDOSCOPICALLY;  Surgeon: Rexene Alberts, MD;  Location: [place];  Service: Open Heart Surgery;  Laterality: N/A;  . CYSTOSCOPY W/ RETROGRADES Bilateral 06/20/2019   Procedure: CYSTOSCOPY WITH RETROGRADE PYELOGRAM;  Surgeon: Ardis Hughs, MD;  Location[facility];  Service: Urology;  Laterality: Bilateral;  . CYSTOSCOPY WITH HYDRODISTENSION AND BIOPSY N/A 06/20/2019   Procedure: CYSTOSCOPY BLADDER  BIOPSY WITH FULGERATION;  Surgeon: Ardis Hughs, MD;  Location: [facility];  Service: Urology;  Laterality: N/A;  . DUPUYTREN CONTRACTURE RELEASE  2001   leftx2  . [place]   right  . DUPUYTREN CONTRACTURE RELEASE Right 05/02/2014   Procedure: EXCISION DUPUYTRENS RIGHT PALMAR/SMALL ;  Surgeon: Cammie Sickle, MD;  Location: [place];  Service: Orthopedics;  Laterality: Right;  [name] MAZE N/A 08/10/2018   Procedure: MAZE;  Surgeon: Rexene Alberts, MD;  Location: [place];  Service: Open Heart Surgery;  Laterality: N/A;  . MITRAL VALVE REPAIR N/A 08/10/2018   Procedure: MITRAL VALVE REPAIR (MVR);  Surgeon: Rexene Alberts, MD;  Location: [place];  Service: Open Heart Surgery;  Laterality: N/A;  glutaraldehyde  . RIGHT/LEFT HEART CATH AND CORONARY ANGIOGRAPHY N/A 06/20/2018   Procedure: RIGHT/LEFT HEART CATH AND CORONARY ANGIOGRAPHY;  Surgeon: [name], MD;  Location: [place] [facility];  Service: Cardiovascular;  Laterality: N/A;  . TEE WITHOUT CARDIOVERSION N/A 06/20/2018   Procedure: TRANSESOPHAGEAL ECHOCARDIOGRAM (TEE);  Surgeon: Pixie Casino, MD;  Location: [facility];  Service: Cardiovascular;  Laterality: N/A;  . TEE WITHOUT CARDIOVERSION N/A 08/10/2018   Procedure: TRANSESOPHAGEAL ECHOCARDIOGRAM (TEE);  Surgeon: Rexene Alberts, MD;  Location: [place];  Service: Open Heart Surgery;  Laterality: N/A;  . TONSILLECTOMY  age 66    Current Medications: Current Meds   Medication Sig  . acetaminophen (TYLENOL) 500 MG tablet Take 500-1,000 mg by mouth every 6 (six) hours as needed for moderate pain or headache.  . allopurinol (ZYLOPRIM) 100 MG tablet Take 200 mg by mouth every evening.   [name] atorvastatin (LIPITOR) 20 MG tablet Take 1 tablet (20 mg total) by mouth daily.  [name] diltiazem (CARDIZEM CD) 180 MG 24 hr capsule Take 1 capsule (180 mg total) by mouth daily.  . furosemide (LASIX) 20 MG tablet Take 20 mg by mouth daily as needed (for swelling/fluid retention.).   [name] KLOR-CON M20 20 MEQ tablet TAKE 1 TABLET BY MOUTH EVERY DAY  . mesalamine (APRISO) 0.375 g 24 hr capsule Take 1.5 g by mouth daily.   . metoprolol tartrate (LOPRESSOR) 100 MG tablet Take 1 tablet (100 mg total) by mouth 2 (two) times daily.  . phenazopyridine (PYRIDIUM) 200 MG tablet Take 1 tablet (200 mg total) by mouth 3 (three) times daily as needed for pain.  . tamoxifen (NOLVADEX) 20 MG tablet TAKE 1 TABLET BY MOUTH EVERY DAY  . traMADol (ULTRAM) 50 MG tablet Take 1-2 tablets (50-100 mg total) by mouth every 6 (six) hours as needed  for moderate pain.  [name] XARELTO 15 MG TABS tablet TAKE 1 TABLET BY MOUTH  DAILY WITH SUPPER     Allergies:   Shellfish allergy   Social History   Socioeconomic History  . Marital status: Widowed    Spouse name: Not on file  . Number of children: 1  . Years of education: Not on file  . Highest education level: Not on file  Occupational History  . Not on file  Tobacco Use  . Smoking status: Former Smoker    Packs/day: 1.00    Years: 20.00    Pack years: 20.00    Types: Cigarettes    Quit date: 11/22/1992    Years since quitting: 27.2  . Smokeless tobacco: Never Used  Substance and Sexual Activity  . Alcohol use: Yes    Alcohol/week: 3.0 standard drinks    Types: 3 Standard drinks or equivalent per week    Comment: social  . Drug use: No  . Sexual activity: Not Currently    Partners: Male    Birth control/protection: Post-menopausal  Other Topics  Concern  . Not on file  Social History Narrative  . Not on file   Social Determinants of Health   Financial Resource Strain:   . Difficulty of Paying Living Expenses:   Food Insecurity:   . Worried About Charity fundraiser in the Last Year:   . Arboriculturist in the Last Year:   Transportation Needs:   . Film/video editor (Medical):   [name] Lack of Transportation (Non-Medical):   Physical Activity:   . Days of Exercise per Week:   . Minutes of Exercise per Session:   Stress:   . Feeling of Stress :   Social Connections:   . Frequency of Communication with Friends and Family:   . Frequency of Social Gatherings with Friends and Family:   . Attends Religious Services:   . Active Member of Clubs or Organizations:   . Attends Archivist Meetings:   [name] Marital Status:      Family History: The patient's family history includes Breast cancer in her sister; Breast cancer (age of onset: 15) in her mother; Cirrhosis in her mother; Heart attack in her father; Heart failure in her father; Kidney Stones in her sister. There is no history of Osteoporosis.  ROS:   Please see the history of present illness.    Does not have the energy that she had no heart was in rhythm.  Does not want to go through cardioversion again.  All other systems reviewed and are negative.  EKGs/Labs/Other Studies Reviewed:    The following studies were reviewed today: No new data  EKG:  EKG not repeated  Recent Labs: 08/01/2019: ALT 10 09/25/2019: TSH 0.882 10/30/2019: BUN 21; Creatinine, Ser 0.96; Hemoglobin 13.0; Platelets 120; Potassium 4.0; Sodium 140  Recent Lipid Panel No results found for: CHOL, TRIG, HDL, CHOLHDL, VLDL, LDLCALC, LDLDIRECT  Physical Exam:    VS:  BP (!) 94/54   Pulse 84   Ht 5' 4"  (1.626 m)   Wt 129 lb 12.8 oz (58.9 kg)   SpO2 96%   BMI 22.28 kg/m     Wt Readings from Last 3 Encounters:  02/11/20 129 lb 12.8 oz (58.9 kg)  01/15/20 133 lb (60.3 kg)  11/05/19 127  lb (57.6 kg)     GEN: Compatible with age. No acute distress HEENT: Normal NECK: No JVD. LYMPHATICS: No lymphadenopathy CARDIAC:  RRR without murmur,  gallop, or edema. VASCULAR:  Normal Pulses. No bruits. RESPIRATORY:  Clear to auscultation without rales, wheezing or rhonchi  ABDOMEN: Soft, non-tender, non-distended, No pulsatile mass, MUSCULOSKELETAL: No deformity  SKIN: Warm and dry NEUROLOGIC:  Alert and oriented x 3 PSYCHIATRIC:  Normal affect   ASSESSMENT:    1. Atypical atrial flutter (H[facility])   2. Persistent atrial fibrillation ([place])   3. S/P mitral valve repair + CABG x1 + maze procedure   4. Essential hypertension   5. S/P CABG x 1   6. Chronic diastolic HF (heart failure) ([place])   7. Long term (current) use of anticoagulants   8. Educated about COVID-19 virus infection    PLAN:    In order of problems listed above:  1. Rate control with Lopressor and diltiazem.  Blood pressure is marginal.  May need to cut diltiazem back to 120 mg/day.  She is not currently symptomatic so I will leave as is.  I have asked her to use Lasix only if absolutely necessary for swelling. 2. As above. 3. No auscultatory evidence of mitral regurgitation. 4. Lowish blood pressure.  Encouraged fluid intake. 5. Secondary prevention discussed including 150 minutes of moderate activity. 6. There is no evidence of volume overload.  Lasix is as needed. 7. Continue Xarelto.  Watch for bleeding. 8. COVID-19 vaccine has been received.  Social distancing and mask wearing continues.   Clinical follow-up with me in 6 months.   Medication Adjustments/Labs and Tests Ordered: Current medicines are reviewed at length with the patient today.  Concerns regarding medicines are outlined above.  No orders of the defined types were placed in this encounter.  No orders of the defined types were placed in this encounter.   There are no Patient Instructions on file for this visit.   Signed, Sinclair Grooms, MD  02/11/2020 2:19 PM    [facility]

## 2020-02-26 ENCOUNTER — Encounter: Payer: Self-pay | Admitting: Nurse Practitioner

## 2020-02-26 NOTE — Progress Notes (Signed)
I received and reviewed her recent labs from 01/21/2020 at [facility]. She has mild macrocytic anemia, Hgb 11.8, which has fluctuated over several years. CMP is normal. No new concerns. The results were scanned in. We will continue monitoring her labs at next routine office visit in 04/2020.   Cira Rue, NP

## 2020-02-28 ENCOUNTER — Other Ambulatory Visit: Payer: Self-pay | Admitting: Nurse Practitioner

## 2020-02-28 DIAGNOSIS — C50412 Malignant neoplasm of upper-outer quadrant of left female breast: Secondary | ICD-10-CM

## 2020-02-28 DIAGNOSIS — Z170 Estrogen receptor positive status [ER+]: Secondary | ICD-10-CM

## 2020-03-03 ENCOUNTER — Other Ambulatory Visit: Payer: Self-pay

## 2020-03-08 ENCOUNTER — Other Ambulatory Visit: Payer: Self-pay | Admitting: Interventional Cardiology

## 2020-03-14 ENCOUNTER — Ambulatory Visit
Admission: RE | Admit: 2020-03-14 | Discharge: 2020-03-14 | Disposition: A | Discharge status: Home / Self Care | Payer: Medicare Other | Source: Ambulatory Visit | Attending: Nurse Practitioner | Admitting: Nurse Practitioner

## 2020-03-14 ENCOUNTER — Other Ambulatory Visit: Payer: Self-pay

## 2020-03-14 DIAGNOSIS — C50412 Malignant neoplasm of upper-outer quadrant of left female breast: Secondary | ICD-10-CM

## 2020-03-14 DIAGNOSIS — Z170 Estrogen receptor positive status [ER+]: Secondary | ICD-10-CM

## 2020-03-23 ENCOUNTER — Other Ambulatory Visit: Payer: Self-pay | Admitting: Interventional Cardiology

## 2020-03-24 NOTE — Telephone Encounter (Signed)
Pt last saw Dr Tamala Julian 02/11/20, last labs 10/30/19 Creat 0.96, age 79, weight 58.9kg, CrCl 42.73, based on CrCl pt is on appropriate dosage of Xarelto 67m QD.  Will refill rx.

## 2020-04-01 ENCOUNTER — Telehealth: Payer: Self-pay | Admitting: Interventional Cardiology

## 2020-04-01 MED ORDER — AMOXICILLIN 500 MG PO TABS: 2000.0000 mg | ORAL_TABLET | ORAL | 1 refills | Status: DC | PRN

## 2020-04-01 NOTE — Telephone Encounter (Addendum)
   Primary Cardiologist: Sinclair Grooms, MD, EP - Dr. Curt Bears  Chart reviewed as part of pre-operative protocol coverage. Pt with history of CAD s/p CABG, severe MR s/p repair, breast CA, HTN, persistent atrial fib, HLD, UC.   SBE prophylaxis is required for the patient for all dental cleanings or dental procedures due to history of mitral valve repair. Will route to [facility] callback staff please notify patient and call in amoxicillin 576m tablets disp #4 with 1 refill - take 4 tablets by mouth 30-60 minutes prior to dental cleanings/procedures. (Please confirm no allergy to amoxicillin - none listed.)   It is also generally accepted that for simple extractions and dental cleanings, there is no need to interrupt blood thinner therapy.   I will route this recommendation to the requesting party via Epic fax function and remove from pre-op pool.  Please call with questions.  DCharlie Pitter PA-C 04/01/2020, 11:10 AM

## 2020-04-01 NOTE — Telephone Encounter (Signed)
I s/w the pt and advised an Rx for Amoxicillin has been sent in to [facility] on  [address]. I went over the directions Amoxicillin 500 mg tab; take 4 tabs 30-60 minutes prior to dental cleaning/procedure. I confirmed with the pt that she does not have any allergies to Amoxicillin that she is aware of. Pt thanked me for the call and the help. I will also send notes to [facility]. I will remove from the pre op call back pool.

## 2020-04-01 NOTE — Telephone Encounter (Signed)
I s/w the pt and confirmed her dental procedure for tomorrow. I asked the pt who is the dentist who will be doing her cleaning. Pt states dental office is [facility]. I then called the dental office [phone]. I did confirm with dental practice as well pt is having a dental cleaning only. Per dental office pt has had SBE in the past though they are not requiring any SB

## 2020-04-01 NOTE — Telephone Encounter (Signed)
   Pt c/o medication issue:  1. Name of Medication: antibiotic  2. How are you currently taking this medication (dosage and times per day)?   3. Are you having a reaction (difficulty breathing--STAT)?   4. What is your medication issue? Pt said she has teeth cleaning appt tomorrow. And was advised by her dentist to ask [name] to prescribed her antibiotic she can take prior her cleaning  Please advise

## 2020-04-01 NOTE — Addendum Note (Signed)
Addended by: Michae Kava on: 04/01/2020 11:32 AM   Modules accepted: Orders

## 2020-04-01 NOTE — Telephone Encounter (Signed)
Addendum: per dental office they are not requiring SBE for dental cleaning. Pt has had valve surgery in the past. I advised dental office I will send information to our pre op team for evaluation and we will send recommendations to fax number listed.      [facility] Pre-operative Risk Assessment    Request for surgical clearance: PT CALLED AND ASKED IF SHE IS NEEDING SBE  1. What type of surgery is being performed? DENTAL CLEANING    2. When is this surgery scheduled? 04/02/20   3. What type of clearance is required (medical clearance vs. Pharmacy clearance to hold med vs. Both)? MEDICAL  4. Are there any medications that need to be held prior to surgery and how long? NONE   5. Practice name and name of physician performing surgery? [facility]   6. What is your office phone number [phone]   7.   What is your office fax number [phone]  8.   Anesthesia type (None, local, MAC, general) ? NONE    Michele Green 04/01/2020, 10:34 AM  _________________________________________________________________   (provider comments below)

## 2020-04-07 ENCOUNTER — Telehealth: Payer: Self-pay | Admitting: Interventional Cardiology

## 2020-04-07 ENCOUNTER — Other Ambulatory Visit: Payer: Self-pay | Admitting: Cardiology

## 2020-04-07 ENCOUNTER — Encounter: Payer: Self-pay | Admitting: Interventional Cardiology

## 2020-04-07 NOTE — Telephone Encounter (Signed)
Patient calling back.   °

## 2020-04-07 NOTE — Telephone Encounter (Signed)
Called patient and advised that if she is having bleeding from her rectum or in her stool that she should contact the doctor that treats her colitis. She took amoxicillin 2000 mg as prescribed prior to dental appointment and she thinks this caused the diarrhea. I advised that since she takes Xarelto, she will need to have follow-up to determine if she is having a GI bleed. I advised that she should have a GI evaluation and that our office should be contacted if it is felt she should hold or stop Xarelto. States she does not want to have a stroke. She agrees to call GI doctor for evaluation and thanked me for the help.

## 2020-04-07 NOTE — Telephone Encounter (Signed)
error 

## 2020-04-07 NOTE — Telephone Encounter (Signed)
Pt c/o medication issue:  1. Name of Medication: amoxicillin (AMOXIL) 500 MG tablet  2. How are you currently taking this medication (dosage and times per day)? n/a  3. Are you having a reaction (difficulty breathing--STAT)? yes  4. What is your medication issue? Patient took this medication as directed before her dental cleaning but states she is still having diarrhea. She states that it is mostly blood. She is feeling dizzy and weak. She has taken over the counter medication for diarrhea but that does not seem to be helping. She states she has Colitis and is unsure if that has kicked in.

## 2020-04-08 NOTE — Telephone Encounter (Signed)
Thanks

## 2020-04-30 NOTE — Progress Notes (Signed)
[facility]   [phone] Fax:([phone]) [phone]   Clinic Follow up Note   Patient Care Team: [name], MD as PCP - General (Family Medicine) [name], MD as PCP - Cardiology (Cardiology) Constance Haw, MD as PCP - Electrophysiology (Cardiology)  Date of Service:  05/01/2020  CHIEF COMPLAINT: Follow up left breast cancer  SUMMARY OF ONCOLOGIC HISTORY: Oncology History Overview Note  Breast cancer of upper-outer quadrant of left female breast [facility])   Staging form: Breast, AJCC 7th Edition   - Clinical stage from 01/28/2016: Stage IA (T1a, N0, M0) - Signed by Truitt Merle, MD on 02/03/2016   - Pathologic stage from 02/23/2016: Stage IA (T1b, N0, cM0) - Signed by Truitt Merle, MD on 06/14/2016      Breast cancer of upper-outer quadrant of left female breast ([place])  01/14/2016 Mammogram   Screening mammogram showed possible mass with distortion in the left breast.   01/23/2016 Imaging   Diagnostic mammogram and ultrasound of the left breast showed a 3-4 mm shadowing mass at 1:00 in the left breast, no adenopathy.   01/28/2016 Initial Diagnosis   Breast cancer of upper-outer quadrant of left female breast ([place])   01/28/2016 Initial Biopsy   Left breast mass biopsy showed invasive ductal carcinoma, grade 1.   01/28/2016 Receptors her2   Your 100% positive, PR 100% positive, HER-2 negative, Ki-67 5%   02/23/2016 Surgery   Left breast lumpectomy, and sentinel lymph node biopsy   02/23/2016 Pathology Results   Left breast lumpectomy showed a 1.0 cm invasive ductal carcinoma, grade 1, no lymphvascular invasion, invasive carcinoma is broadly less than 0.1 cm to the anterior inferior margin, ADH, ALH, 1 node(-). Left inferior reexcision margin showed LCIS    04/21/2016 - 05/19/2016 Radiation Therapy   adjuvant breast radiation    06/10/2016 -  Anti-estrogen oral therapy   Anastrozole 1 mg daily.  Changed to Tamoxifen due to osteoporosis on 09/30/17, which was held  in Sep 2019 for her open heart surgery and restarted in 10/2018     02/03/2017 Mammogram   Bilateral diagnostic mammogram 02/03/17 IMPRESSION: Expected surgical changes in the upper-outer quadrant of the left breast. No mammographic evidence of malignancy in the bilateral breasts.   02/20/2018 Mammogram   02/20/2018 Mammogram IMPRESSION: No evidence of malignancy within either breast. Stable postsurgical changes within the left breast.   03/09/2019 Mammogram   IMPRESSION: No mammographic evidence of malignancy.   RECOMMENDATION: Annual diagnostic mammography.      CURRENT THERAPY:  1. Anastrozole 63m daily, started in 05/2016, changed to Tamoxifen due to osteoporosis on 09/30/17, which was held in Sep 2019 for her open heart surgeryand restarted in 10/2018. Plan to complete 05/2021.  2. Prolia injections every 6 months with Dr. sBrigitte Pulse INTERVAL HISTORY:  Michele OSTROSKYis here for a follow up of left breast cancer. She was last seen by me in 01/2019. She has been seen by NP Lacie in interim, last in 01/2020. She presents to the clinic alone. She has C. Diff and treated with antibiotics.  Then after teeth cleaning she was given antibiotics which caused her colitis which she has still has now. She has loose BM 4-5 times a day. She is trying to drink enough water. Imodium helped her some. She notes she is tolerating Tamoxifen well. She denies vaginal bleeding or discharge and no exacerbated joint pain. She has diffuse extremity bruising from Xarelto. She still lives alone and takes care of herself.  She has sister in town and son who lives 2 hours away. She notes she falls occasionally. She has cane and walker but not use it much.     REVIEW OF SYSTEMS:   Constitutional: Denies fevers, chills or abnormal weight loss Eyes: Denies blurriness of vision Ears, nose, mouth, throat, and face: Denies mucositis or sore throat Respiratory: Denies cough, dyspnea or wheezes Cardiovascular: Denies  palpitation, chest discomfort or lower extremity swelling Gastrointestinal:  Denies nausea, heartburn or change in bowel habits (+) Colitis/diarrhea  Skin: Denies abnormal skin rashes Lymphatics: Denies new lymphadenopathy (+) easy bruising or b/l arms.  Neurological:Denies numbness, tingling or new weaknesses Behavioral/Psych: Mood is stable, no new changes  All other systems were reviewed with the patient and are negative.  MEDICAL HISTORY:  Past Medical History:  Diagnosis Date  . Arrhythmia   . Atypical atrial flutter ([place]) 08/14/2018   Post-operative  . Breast cancer of upper-outer quadrant of left female breast ([place]) 02/02/2016  . CAD (coronary artery disease) 06/20/2018   LHC 7/19: pLAD 65/90, oD1 90, mLCx 85, OM2 50, oRCA 30, EF 50-55 >> s/p CABG in 9/19 (L-LAD)  . Colon polyp 02/2012  . Dupuytren contracture    bilateral hands  . Family history of breast cancer   . Hyperlipidemia   . Hypertension   . Mitral regurgitation 11/07/2013   Echo 7/19: Mild LVH, EF 60-65, no RWMA, mod ot severe MR, massive LAE, PASP 33 // TEE 7/19:  Mild conc LVH, EF 60-65, no RWMA, severe MR with mild post leaflet prolapse, massive // s/p MV repair 07/2018  . On continuous oral anticoagulation 08/22/2015   Started on Xarelto 08/12/2015   . Osteopenia   . Persistent atrial fibrillation ([place]) 08/22/2015   Started late August or early September 2016 // s/p Maze procedure 07/2018  . Personal history of radiation therapy   . Radiation Therapy 04/21/16-05/19/16   left breast 47.72 Gy, boosted to 10 Gy  . 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 S/P Maze operation for atrial fibrillation 08/10/2018   Complete bilateral atrial lesion set using bipolar radiofrequency and cryothermy ablation with clipping of LA appendage  . S/P MVR (mitral valve repair) 08/10/2018   Complex valvuloplasty including Gore-tex neochord placement x8, Plication of Lateral Commissure and 7m Sorin Memo 4D Ring Annuloplasty SN# [id number]   . Ulcerative colitis [place] [employer]   . Wears glasses     SURGICAL HISTORY: Past Surgical History:  Procedure Laterality Date  . BREAST BIOPSY Left 01/28/2016  . BREAST LUMPECTOMY Left 02/23/2016  . BREAST LUMPECTOMY WITH RADIOACTIVE SEED LOCALIZATION Left 02/23/2016   Procedure: BREAST LUMPECTOMY WITH RADIOACTIVE SEED LOCALIZATION;  Surgeon: BExcell Seltzer MD;  [place] [facility]  Service: General;  Laterality: Left;  . CARDIAC CATHETERIZATION    . CARDIOVERSION N/A 10/02/2015   Procedure: CARDIOVERSION;  Surgeon: MJerline Pain MD;  [facility]  Service: Cardiovascular;  Laterality: N/A;  . CARDIOVERSION N/A 10/05/2018   Procedure: CARDIOVERSION;  Surgeon: RFay Records MD;  [facility];  Service: Cardiovascular;  Laterality: N/A;  . CARDIOVERSION N/A 11/05/2019   Procedure: CARDIOVERSION;  Surgeon: NDorothy Spark MD;  [place]  Service: Cardiovascular;  Laterality: N/A;  . CLIPPING OF ATRIAL APPENDAGE  08/10/2018   Procedure: CLIPPING OF ATRIAL APPENDAGE;  Surgeon: ORexene Alberts MD;  [place] [facility]  Service: Open Heart Surgery;;  . COLONOSCOPY    . CORONARY ARTERY BYPASS GRAFT N/A  08/10/2018   Procedure: CORONARY ARTERY BYPASS GRAFTING (CABG) x 1, LIMA-LAD,  USING LEFT INTERNAL MAMMARY ARTERY. HARVESTED RIGHT GREATER SAPHENOUS VEIN ENDOSCOPICALLY;  Surgeon: Rexene Alberts, MD;  Location: [facility];  Service: Open Heart Surgery;  Laterality: N/A;  . CYSTOSCOPY W/ RETROGRADES Bilateral 06/20/2019   Procedure: CYSTOSCOPY WITH RETROGRADE PYELOGRAM;  Surgeon: Ardis Hughs, MD;  [facility];  Service: Urology;  Laterality: Bilateral;  . CYSTOSCOPY WITH HYDRODISTENSION AND BIOPSY N/A 06/20/2019   Procedure: CYSTOSCOPY BLADDER  BIOPSY WITH FULGERATION;  Surgeon: Ardis Hughs, MD;  Location: [facility];  Service: Urology;  Laterality: N/A;  . DUPUYTREN CONTRACTURE RELEASE  2001    leftx2  . [place]   right  . DUPUYTREN CONTRACTURE RELEASE Right 05/02/2014   Procedure: EXCISION DUPUYTRENS RIGHT PALMAR/SMALL ;  Surgeon: Cammie Sickle, MD;  Location: [place];  Service: Orthopedics;  Laterality: Right;  [name] MAZE N/A 08/10/2018   Procedure: MAZE;  Surgeon: Rexene Alberts, MD;  Location: [place];  Service: Open Heart Surgery;  Laterality: N/A;  . MITRAL VALVE REPAIR N/A 08/10/2018   Procedure: MITRAL VALVE REPAIR (MVR);  Surgeon: Rexene Alberts, MD;  Location: [place];  Service: Open Heart Surgery;  Laterality: N/A;  glutaraldehyde  . RIGHT/LEFT HEART CATH AND CORONARY ANGIOGRAPHY N/A 06/20/2018   Procedure: RIGHT/LEFT HEART CATH AND CORONARY ANGIOGRAPHY;  Surgeon: [name], MD;  Location: [place] CV LAB;  Service: Cardiovascular;  Laterality: N/A;  . TEE WITHOUT CARDIOVERSION N/A 06/20/2018   Procedure: TRANSESOPHAGEAL ECHOCARDIOGRAM (TEE);  Surgeon: Pixie Casino, MD;  [facility];  Service: Cardiovascular;  Laterality: N/A;  . TEE WITHOUT CARDIOVERSION N/A 08/10/2018   Procedure: TRANSESOPHAGEAL ECHOCARDIOGRAM (TEE);  Surgeon: Rexene Alberts, MD;  Location: [place];  Service: Open Heart Surgery;  Laterality: N/A;  . TONSILLECTOMY  age 17    I have reviewed the social history and family history with the patient and they are unchanged from previous note.  ALLERGIES:  is allergic to shellfish allergy.  MEDICATIONS:  Current Outpatient Medications  Medication Sig Dispense Refill  . tamoxifen (NOLVADEX) 20 MG tablet TAKE 1 TABLET BY MOUTH EVERY DAY 90 tablet 1  . acetaminophen (TYLENOL) 500 MG tablet Take 500-1,000 mg by mouth every 6 (six) hours as needed for moderate pain or headache.    . allopurinol (ZYLOPRIM) 100 MG tablet Take 200 mg by mouth every evening.     [name] amoxicillin (AMOXIL) 500 MG tablet Take 4 tablets (2,000 mg total) by mouth as needed (TO BE USED FOR DENTAL PROCEDURE). Take 4 tabs 30-60  minutes before dental cleaning/procedure 4 tablet 1  . atorvastatin (LIPITOR) 20 MG tablet TAKE 1 TABLET BY MOUTH EVERY DAY 90 tablet 3  . diltiazem (CARDIZEM CD) 180 MG 24 hr capsule Take 1 capsule (180 mg total) by mouth daily. 30 capsule 3  . furosemide (LASIX) 20 MG tablet Take 20 mg by mouth daily as needed (for swelling/fluid retention.).     [name] KLOR-CON M20 20 MEQ tablet TAKE 1 TABLET BY MOUTH EVERY DAY 90 tablet 3  . mesalamine (APRISO) 0.375 g 24 hr capsule Take 1.5 g by mouth daily.     . metoprolol tartrate (LOPRESSOR) 100 MG tablet TAKE 1 TABLET BY MOUTH TWICE A DAY 180 tablet 3  . phenazopyridine (PYRIDIUM) 200 MG tablet Take 1 tablet (200 mg total) by mouth 3 (three) times daily as needed for  pain. 10 tablet 0  . traMADol (ULTRAM) 50 MG tablet Take 1-2 tablets (50-100 mg total) by mouth every 6 (six) hours as needed for moderate pain. 15 tablet 0  . XARELTO 15 MG TABS tablet TAKE 1 TABLET BY MOUTH  DAILY WITH SUPPER 90 tablet 1   No current facility-administered medications for this visit.    PHYSICAL EXAMINATION: ECOG PERFORMANCE STATUS: 1 - Symptomatic but completely ambulatory  Vitals:   05/01/20 1301  BP: 96/71  Pulse: (!) 103  Resp: 19  Temp: 97.9 F (36.6 C)  SpO2: 100%   Filed Weights   05/01/20 1301  Weight: 120 lb 9.6 oz (54.7 kg)    GENERAL:alert, no distress and comfortable SKIN: skin color, texture, turgor are normal, no rashes or significant lesions (+) Diffuse bruising of b/l arms EYES: normal, Conjunctiva are pink and non-injected, sclera clear  NECK: supple, thyroid normal size, non-tender, without nodularity LYMPH:  no palpable lymphadenopathy in the cervical, axillary  LUNGS: clear to auscultation and percussion with normal breathing effort HEART: regular rate & rhythm and no murmurs and no lower extremity edema ABDOMEN:abdomen soft, non-tender and normal bowel sounds Musculoskeletal:no cyanosis of digits and no clubbing  NEURO: alert & oriented  x 3 with fluent speech, no focal motor/sensory deficits BREAST: S/p left lumpectomy: Surgical incision healed well. No palpable mass, nodules or adenopathy bilaterally. Breast exam benign.  LABORATORY DATA:  I have reviewed the data as listed CBC Latest Ref Rng & Units 10/30/2019 09/25/2019 08/01/2019  WBC 3.4 - 10.8 x10E3/uL 8.9 6.5 6.2  Hemoglobin 11.1 - 15.9 g/dL 13.0 10.9(L) 11.6(L)  Hematocrit 34.0 - 46.6 % 38.7 32.4(L) 34.6(L)  Platelets 150 - 450 x10E3/uL 120(L) 124(L) 138(L)     CMP Latest Ref Rng & Units 10/30/2019 09/25/2019 08/01/2019  Glucose 65 - 99 mg/dL 83 114(H) 91  BUN 8 - 27 mg/dL _0 Creatinine 0.57 - 1.00 mg/dL 0.96 0.84 0.92  Sodium 134 - 144 mmol/L 140 145(H) 139  Potassium 3.5 - 5.2 mmol/L 4.0 4.4 3.8  Chloride 96 - 106 mmol/L 103 106 106  CO2 20 - 29 mmol/L _1 Calcium 8.7 - 10.3 mg/dL 8.9 8.4(L) 8.0(L)  Total Protein 6.5 - 8.1 g/dL - - 6.6  Total Bilirubin 0.3 - 1.2 mg/dL - - 0.6  Alkaline Phos 38 - 126 U/L - - 57  AST 15 - 41 U/L - - 19  ALT 0 - 44 U/L - - 10      RADIOGRAPHIC STUDIES: I have personally reviewed the radiological images as listed and agreed with the findings in the report. No results found.   ASSESSMENT & PLAN:  WENDELIN READER is a 79 y.o. female with    1. Breast cancer of upper-outer quadrant of left female breast, G1 invasive ductal carcinoma, pT1bN0M0, stage IA, ER+/PR+/HER2- -She was diagnosed in 01/2016. She is s/p left breast lumpectomy and adjuvant radiation.  -She started anti-estrogen therapy with anastrozole in 05/2016. Due to osteoporosis she was switched to Tamoxifen therapy. Tolerable with mild hot flashes. Plan to complete in summer 2022.  -She is clinically doing well. Lab reviewed from her PCP office, her CBC and CMP are within normal limits except mild anemia. Her physical exam and her 02/2020 mammogram were unremarkable. There is no clinical concern for recurrence. -Next mammogram in 02/2021 -Continue  Tamoxifen. Plan for a total of 5 years adjuvant antiestrogen therapy, until Oct 2022. -F/u in 1 year  2. Osteoporosis, Leg weakness, Occasional Falls -She had spinal fracture after a mild fall in 2018, she recovered well. -07/15/17 bone density shows osteoporosis with the lowest T-Score in the left femur neck, -3.3, which has definitely gotten worse since 2014, when her T score was -2.2 -Continue Prolia injections with Dr. Brigitte Pulse. I strongly encouraged her to start calcium supplement and continue Vitamin D.  -Per her 01/2020 visit with NP Lacie, she is not interested in repeating DEXA, she will f/u with Dr. Brigitte Pulse -She has some weakness of her legs and occasionally falls. She has cane but does not use it much. I recommend PT to help her gain weight. I also recommend she work on gaining weight. Given she wants at home PT, [facility].   3. HTN,CAD,AF, mitral regurgitation  -She'll continue follow-up with her primary care physician and cardiologist. -She iss/p CABGx1,mitral valve repair and maze operation for atrial fibrillationon 08/10/2018. Recovered well.  -Currently undergoing cardiac rehab  -She was previously on Coumadin, now on Xarelto. This does cause diffuse easy bruising. I encouraged her to avoid fall.   4. Genetics testing was negative  5. Macrocytic anemia  -Developed anemia after her open heart surgery, resolved now  -She has had microcytosis even before her open heart surgery -folic acid, V35, methylmalonic acid level were WNL in Sep 2020  6. Recent Infections  -with recent antibiotic use she develops C. Diff Colitis with diarrhea. She has completed course of antibiotics. She has loose stool 4-5 times daily.  -I recommend she increase use of imodium to 2 tabs at a time. I recommend she drink more water.   Plan -Continue Tamoxifen  -Lab and f/u in 1 year  -Mammogram in 02/2021   No problem-specific Assessment & Plan notes found  for this encounter.   Orders Placed This Encounter  Procedures  . MM DIAG BREAST TOMO BILATERAL    Standing Status:   Future    Standing Expiration Date:   05/01/2021    Order Specific Question:   Reason for Exam (SYMPTOM  OR DIAGNOSIS REQUIRED)    Answer:   screening    Order Specific Question:   Preferred imaging location?    Answer:   [facility]   All questions were answered. The patient knows to call the clinic with any problems, questions or concerns. No barriers to learning was detected. The total time spent in the appointment was 25 minutes.     Truitt Merle, MD 05/01/2020   I, Joslyn Devon, am acting as scribe for Truitt Merle, MD.   I have reviewed the above documentation for accuracy and completeness, and I agree with the above.

## 2020-05-01 ENCOUNTER — Inpatient Hospital Stay: Payer: Medicare Other

## 2020-05-01 ENCOUNTER — Encounter: Payer: Self-pay | Admitting: Hematology

## 2020-05-01 ENCOUNTER — Inpatient Hospital Stay: Payer: Medicare Other | Attending: Nurse Practitioner | Admitting: Hematology

## 2020-05-01 ENCOUNTER — Other Ambulatory Visit: Payer: Self-pay

## 2020-05-01 ENCOUNTER — Telehealth: Payer: Self-pay | Admitting: Hematology

## 2020-05-01 VITALS — BP 96/71 | HR 103 | Temp 97.9°F | Resp 19 | Ht 64.0 in | Wt 120.6 lb

## 2020-05-01 DIAGNOSIS — C50412 Malignant neoplasm of upper-outer quadrant of left female breast: Secondary | ICD-10-CM | POA: Diagnosis present

## 2020-05-01 DIAGNOSIS — I2510 Atherosclerotic heart disease of native coronary artery without angina pectoris: Secondary | ICD-10-CM | POA: Diagnosis not present

## 2020-05-01 DIAGNOSIS — M810 Age-related osteoporosis without current pathological fracture: Secondary | ICD-10-CM | POA: Insufficient documentation

## 2020-05-01 DIAGNOSIS — K529 Noninfective gastroenteritis and colitis, unspecified: Secondary | ICD-10-CM | POA: Insufficient documentation

## 2020-05-01 DIAGNOSIS — R296 Repeated falls: Secondary | ICD-10-CM | POA: Diagnosis not present

## 2020-05-01 DIAGNOSIS — I4891 Unspecified atrial fibrillation: Secondary | ICD-10-CM | POA: Diagnosis not present

## 2020-05-01 DIAGNOSIS — I340 Nonrheumatic mitral (valve) insufficiency: Secondary | ICD-10-CM | POA: Insufficient documentation

## 2020-05-01 DIAGNOSIS — Z923 Personal history of irradiation: Secondary | ICD-10-CM | POA: Insufficient documentation

## 2020-05-01 DIAGNOSIS — I119 Hypertensive heart disease without heart failure: Secondary | ICD-10-CM | POA: Diagnosis not present

## 2020-05-01 DIAGNOSIS — Z170 Estrogen receptor positive status [ER+]: Secondary | ICD-10-CM | POA: Diagnosis not present

## 2020-05-01 NOTE — Telephone Encounter (Signed)
Scheduled appt per 6/10 los.  Pt did not want to schedule a lab with her f/u appt.  Informed the MD nurse.  Printed calendar and avs.

## 2020-05-07 ENCOUNTER — Other Ambulatory Visit: Payer: Self-pay | Admitting: Physician Assistant

## 2020-05-07 DIAGNOSIS — R109 Unspecified abdominal pain: Secondary | ICD-10-CM

## 2020-05-07 DIAGNOSIS — K625 Hemorrhage of anus and rectum: Secondary | ICD-10-CM

## 2020-05-07 DIAGNOSIS — K51919 Ulcerative colitis, unspecified with unspecified complications: Secondary | ICD-10-CM

## 2020-05-07 DIAGNOSIS — R197 Diarrhea, unspecified: Secondary | ICD-10-CM

## 2020-05-13 ENCOUNTER — Ambulatory Visit: Payer: Medicare Other | Admitting: Cardiology

## 2020-05-13 ENCOUNTER — Encounter: Payer: Self-pay | Admitting: Cardiology

## 2020-05-13 ENCOUNTER — Other Ambulatory Visit: Payer: Self-pay

## 2020-05-13 VITALS — BP 128/68 | HR 88 | Ht 64.0 in | Wt 121.0 lb

## 2020-05-13 DIAGNOSIS — I484 Atypical atrial flutter: Secondary | ICD-10-CM

## 2020-05-13 MED ORDER — DILTIAZEM HCL ER COATED BEADS 240 MG PO CP24
240.0000 mg | ORAL_CAPSULE | Freq: Every day | ORAL | 3 refills | Status: DC
Start: 2020-05-13 — End: 2020-05-19

## 2020-05-13 NOTE — Progress Notes (Addendum)
Electrophysiology Office Note   Date:  05/19/2020   ID:  SCHYLAR Green, DOB Jun 02, 1941, MRN [id number]  PCP:  [name], MD  Cardiologist:  Dr Tamala Julian Primary Electrophysiologist: Dr Curt Bears    No chief complaint on file.    History of Present Illness: Michele Green is a 79 y.o. female who is being seen today for the evaluation of atrial fibrillation at the request of [name], MD. She has a PMH significant for CAD s/p CABG, MR s/p repair, breast cancer, and HTN. She reports that she has had atrial fibrillation for many years and has been fairly asymptomatic. She had a Maze procedure at the time of her CABG and MV repair surgery on 08/10/18. Since then, she has also had a DCCV on 10/05/18 but again went back into rate controlled atrial fibrillation. She is in atrial flutter today. She reports that she continues to feel stronger after surgery. Patient is able to exercise at cardiac rehab without issue.  Today, denies symptoms of palpitations, chest pain, shortness of breath, orthopnea, PND, lower extremity edema, claudication, dizziness, presyncope, syncope, bleeding, or neurologic sequela. The patient is tolerating medications without difficulties.  She continues to have issues with her atrial flutter.  Had a prior appointment she was feeling well, but now is more short of breath.  She gets short of breath when walking to her mailbox.  She was taken off of her amiodarone at her last visit as she was not having symptoms from her atrial flutter.   Past Medical History:  Diagnosis Date  . Arrhythmia   . Atypical atrial flutter ([place]) 08/14/2018   Post-operative  . Breast cancer of upper-outer quadrant of left female breast ([place]) 02/02/2016  . CAD (coronary artery disease) 06/20/2018   LHC 7/19: pLAD 65/90, oD1 90, mLCx 85, OM2 50, oRCA 30, EF 50-55 >> s/p CABG in 9/19 (L-LAD)  . Colon polyp 02/2012  . Dupuytren contracture    bilateral hands  . Family history of breast  cancer   . Hyperlipidemia   . Hypertension   . Mitral regurgitation 11/07/2013   Echo 7/19: Mild LVH, EF 60-65, no RWMA, mod ot severe MR, massive LAE, PASP 33 // TEE 7/19:  Mild conc LVH, EF 60-65, no RWMA, severe MR with mild post leaflet prolapse, massive // s/p MV repair 07/2018  . On continuous oral anticoagulation 08/22/2015   Started on Xarelto 08/12/2015   . Osteopenia   . Persistent atrial fibrillation ([place]) 08/22/2015   Started late August or early September 2016 // s/p Maze procedure 07/2018  . Personal history of radiation therapy   . Radiation Therapy 04/21/16-05/19/16   left breast 47.72 Gy, boosted to 10 Gy  . 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 S/P Maze operation for atrial fibrillation 08/10/2018   Complete bilateral atrial lesion set using bipolar radiofrequency and cryothermy ablation with clipping of LA appendage  . S/P MVR (mitral valve repair) 08/10/2018   Complex valvuloplasty including Gore-tex neochord placement x8, Plication of Lateral Commissure and 8m Sorin Memo 4D Ring Annuloplasty SN# [id number] . Ulcerative colitis ([place]   . Wears glasses    Past Surgical History:  Procedure Laterality Date  . BREAST BIOPSY Left 01/28/2016  . BREAST LUMPECTOMY Left 02/23/2016  . BREAST LUMPECTOMY WITH RADIOACTIVE SEED LOCALIZATION Left 02/23/2016   Procedure: BREAST LUMPECTOMY WITH RADIOACTIVE SEED LOCALIZATION;  Surgeon: BExcell Seltzer MD;  [facility]  Service: General;  Laterality:  Left;  . CARDIAC CATHETERIZATION    . CARDIOVERSION N/A 10/02/2015   Procedure: CARDIOVERSION;  Surgeon: Jerline Pain, MD;  Location[facility] ENDOSCOPY;  Service: Cardiovascular;  Laterality: N/A;  . CARDIOVERSION N/A 10/05/2018   Procedure: CARDIOVERSION;  Surgeon: Fay Records, MD;  Location: [facility] ENDOSCOPY;  Service: Cardiovascular;  Laterality: N/A;  . CARDIOVERSION N/A 11/05/2019   Procedure: CARDIOVERSION;  Surgeon: Dorothy Spark, MD;  Location: [place];   Service: Cardiovascular;  Laterality: N/A;  . CLIPPING OF ATRIAL APPENDAGE  08/10/2018   Procedure: CLIPPING OF ATRIAL APPENDAGE;  Surgeon: Rexene Alberts, MD;  Location: [place];  Service: Open Heart Surgery;;  . COLONOSCOPY    . CORONARY ARTERY BYPASS GRAFT N/A 08/10/2018   Procedure: CORONARY ARTERY BYPASS GRAFTING (CABG) x 1, LIMA-LAD,  USING LEFT INTERNAL MAMMARY ARTERY. HARVESTED RIGHT GREATER SAPHENOUS VEIN ENDOSCOPICALLY;  Surgeon: Rexene Alberts, MD;  Location: [place];  Service: Open Heart Surgery;  Laterality: N/A;  . CYSTOSCOPY W/ RETROGRADES Bilateral 06/20/2019   Procedure: CYSTOSCOPY WITH RETROGRADE PYELOGRAM;  Surgeon: Ardis Hughs, MD;  Location: [facility];  Service: Urology;  Laterality: Bilateral;  . CYSTOSCOPY WITH HYDRODISTENSION AND BIOPSY N/A 06/20/2019   Procedure: CYSTOSCOPY BLADDER  BIOPSY WITH FULGERATION;  Surgeon: Ardis Hughs, MD;  Location: [facility];  Service: Urology;  Laterality: N/A;  . DUPUYTREN CONTRACTURE RELEASE  2001   leftx2  . [place]   right  . DUPUYTREN CONTRACTURE RELEASE Right 05/02/2014   Procedure: EXCISION DUPUYTRENS RIGHT PALMAR/SMALL ;  Surgeon: Cammie Sickle, MD;  Location: [place];  Service: Orthopedics;  Laterality: Right;  [name] MAZE N/A 08/10/2018   Procedure: MAZE;  Surgeon: Rexene Alberts, MD;  Location: [place];  Service: Open Heart Surgery;  Laterality: N/A;  . MITRAL VALVE REPAIR N/A 08/10/2018   Procedure: MITRAL VALVE REPAIR (MVR);  Surgeon: Rexene Alberts, MD;  Location: [place];  Service: Open Heart Surgery;  Laterality: N/A;  glutaraldehyde  . RIGHT/LEFT HEART CATH AND CORONARY ANGIOGRAPHY N/A 06/20/2018   Procedure: RIGHT/LEFT HEART CATH AND CORONARY ANGIOGRAPHY;  Surgeon: [name], MD;  Location: [place] CV LAB;  Service: Cardiovascular;  Laterality: N/A;  . TEE WITHOUT CARDIOVERSION N/A 06/20/2018   Procedure: TRANSESOPHAGEAL  ECHOCARDIOGRAM (TEE);  Surgeon: Pixie Casino, MD;  Location[facility]COPY;  Service: Cardiovascular;  Laterality: N/A;  . TEE WITHOUT CARDIOVERSION N/A 08/10/2018   Procedure: TRANSESOPHAGEAL ECHOCARDIOGRAM (TEE);  Surgeon: Rexene Alberts, MD;  Location: [place];  Service: Open Heart Surgery;  Laterality: N/A;  . TONSILLECTOMY  age 42     Current Outpatient Medications  Medication Sig Dispense Refill  . acetaminophen (TYLENOL) 500 MG tablet Take 500-1,000 mg by mouth every 6 (six) hours as needed for moderate pain or headache.    . allopurinol (ZYLOPRIM) 100 MG tablet Take 200 mg by mouth daily.     [name] amoxicillin (AMOXIL) 500 MG tablet Take 4 tablets (2,000 mg total) by mouth as needed (TO BE USED FOR DENTAL PROCEDURE). Take 4 tabs 30-60 minutes before dental cleaning/procedure 4 tablet 1  . atorvastatin (LIPITOR) 20 MG tablet TAKE 1 TABLET BY MOUTH EVERY DAY 90 tablet 3  . furosemide (LASIX) 20 MG tablet Take 20 mg by mouth daily as needed (for swelling/fluid retention.).     [name] KLOR-CON M20 20 MEQ tablet TAKE 1 TABLET BY MOUTH EVERY DAY (Patient not taking: Reported on 05/17/2020) 90  tablet 3  . mesalamine (APRISO) 0.375 g 24 hr capsule Take 1.5 g by mouth daily.     . metoprolol tartrate (LOPRESSOR) 100 MG tablet TAKE 1 TABLET BY MOUTH TWICE A DAY 180 tablet 3  . phenazopyridine (PYRIDIUM) 200 MG tablet Take 1 tablet (200 mg total) by mouth 3 (three) times daily as needed for pain. 10 tablet 0  . predniSONE (STERAPRED UNI-PAK 21 TAB) 10 MG (21) TBPK tablet Take 10 mg by mouth daily. Take as directed - for C-Diff    . tamoxifen (NOLVADEX) 20 MG tablet TAKE 1 TABLET BY MOUTH EVERY DAY 90 tablet 1  . traMADol (ULTRAM) 50 MG tablet Take 1-2 tablets (50-100 mg total) by mouth every 6 (six) hours as needed for moderate pain. 15 tablet 0  . XARELTO 15 MG TABS tablet TAKE 1 TABLET BY MOUTH  DAILY WITH SUPPER (Patient taking differently: Take 15 mg by mouth daily with supper. ) 90 tablet 1  . calcium  gluconate 500 MG tablet Take 1 tablet (500 mg total) by mouth daily for 5 days. 5 tablet 0  . cephALEXin (KEFLEX) 500 MG capsule Take 1 capsule (500 mg total) by mouth 3 (three) times daily. 20 capsule 0  . diltiazem (CARDIZEM CD) 240 MG 24 hr capsule Take 1 capsule (240 mg total) by mouth daily. 30 capsule 3   No current facility-administered medications for this visit.    Allergies:   Shellfish allergy   Social History:  The patient  reports that she quit smoking about 27 years ago. Her smoking use included cigarettes. She has a 20.00 pack-year smoking history. She has never used smokeless tobacco. She reports current alcohol use of about 3.0 standard drinks of alcohol per week. She reports that she does not use drugs.   Family History:  The patient's family history includes Breast cancer in her sister; Breast cancer (age of onset: 45) in her mother; Cirrhosis in her mother; Heart attack in her father; Heart failure in her father; Kidney Stones in her sister.   ROS:  Please see the history of present illness.   Otherwise, review of systems is positive for none.   All other systems are reviewed and negative.   PHYSICAL EXAM: VS:  BP 128/68   Pulse 88   Ht 5' 4"  (1.626 m)   Wt 121 lb (54.9 kg)   SpO2 93%   BMI 20.77 kg/m  , BMI Body mass index is 20.77 kg/m. GEN: Well nourished, well developed, in no acute distress  HEENT: normal  Neck: no JVD, carotid bruits, or masses Cardiac: Irregular, tachycardic; no murmurs, rubs, or gallops,no edema  Respiratory:  clear to auscultation bilaterally, normal work of breathing GI: soft, nontender, nondistended, + BS MS: no deformity or atrophy  Skin: warm and dry Neuro:  Strength and sensation are intact Psych: euthymic mood, full affect  EKG:  EKG is ordered today. Personal review of the ekg ordered shows atypical atrial flutter  Recent Labs: 09/25/2019: TSH 0.882 05/17/2020: ALT 62; B Natriuretic Peptide 237.4; BUN 20; Creatinine, Ser 0.87;  Hemoglobin 8.2; Platelets 160; Potassium 4.4; Sodium 138    Lipid Panel  No results found for: CHOL, TRIG, HDL, CHOLHDL, VLDL, LDLCALC, LDLDIRECT   Wt Readings from Last 3 Encounters:  05/13/20 121 lb (54.9 kg)  05/01/20 120 lb 9.6 oz (54.7 kg)  02/11/20 129 lb 12.8 oz (58.9 kg)      Other studies Reviewed: Additional studies/ records that were reviewed today include: TTE  09/05/2019 Review of the above records today demonstrates:   1. Left ventricular ejection fraction, by visual estimation, is 60 to 65%. The left ventricle has normal function. There is moderately increased left ventricular hypertrophy.  2. Global right ventricle has moderately reduced systolic function.The right ventricular size is normal. No increase in right ventricular wall thickness.  3. Left atrial size was severely dilated.  4. Right atrial size was normal.  5. The mitral valve has been repaired/replaced. Mild mitral valve regurgitation. Mild-moderate mitral stenosis.  6. MV peak gradient, 19.4 mmHg. HR 117 during this study.  7. The tricuspid valve is grossly normal. Tricuspid valve regurgitation is mild.  8. The aortic valve is tricuspid Aortic valve regurgitation was not visualized by color flow Doppler.  9. The pulmonic valve was grossly normal. Pulmonic valve regurgitation is trivial by color flow Doppler. 10. Moderately elevated pulmonary artery systolic pressure. 11. The inferior vena cava is dilated in size with <50% respiratory variability, suggesting right atrial pressure of 15 mmHg.   ASSESSMENT AND PLAN:  1.  Persistent atrial fibrillation Status post maze 08/10/2018 with cardioversion 10/05/2018.  Left atrium is severely dilated.  Currently on metoprolol, Xarelto.  CHA2DS2-VASc of 5.  Ashely Joshua increase diltiazem for improved rate control.  2. CAD Status post CABG x1.  No current chest pain.  3. HTN Well controlled  4. HLD Continue statin  5.  Atypical atrial flutter: Left atrium is severely  dilated.  Status post cardioversion 11/05/2019.  Unfortunately she has gone back into atrial flutter and is now on a rate control strategy.  She is in atrial flutter today which I fear has become somewhat permanent as it is quite atypical.  She has not an ablation candidate.  She has failed amiodarone.  Kazaria Gaertner increase diltiazem to 240 mg to better control her rates.  Current medicines are reviewed at length with the patient today.   The patient does not have concerns regarding her medicines.  The following changes were made today: Increase diltiazem  Labs/ tests ordered today include:  Orders Placed This Encounter  Procedures  . EKG 12-Lead    Disposition:   FU with Abdulraheem Pineo 6 months.   Signed, Jase Reep Meredith Leeds, MD  05/19/2020 2:49 PM     [place] [address] [place] [address] [phone] (office) [phone] (fax)

## 2020-05-13 NOTE — Patient Instructions (Signed)
Medication Instructions:  Your physician has recommended you make the following change in your medication:  1. INCREASE Diltiazem to 240 mg once daily  *If you need a refill on your cardiac medications before your next appointment, please call your pharmacy*   Lab Work: None ordered If you have labs (blood work) drawn today and your tests are completely normal, you will receive your results only by: [name] MyChart Message (if you have MyChart) OR . A paper copy in the mail If you have any lab test that is abnormal or we need to change your treatment, we will call you to review the results.   Testing/Procedures: None ordered   Follow-Up: At [facility], you and your health needs are our priority.  As part of our continuing mission to provide you with exceptional heart care, we have created designated Provider Care Teams.  These Care Teams include your primary Cardiologist (physician) and Advanced Practice Providers (APPs -  Physician Assistants and Nurse Practitioners) who all work together to provide you with the care you need, when you need it.  We recommend signing up for the patient portal called "MyChart".  Sign up information is provided on this After Visit Summary.  MyChart is used to connect with patients for Virtual Visits (Telemedicine).  Patients are able to view lab/test results, encounter notes, upcoming appointments, etc.  Non-urgent messages can be sent to your provider as well.   To learn more about what you can do with MyChart, go to [email or web address].    Your next appointment:   6 month(s)  The format for your next appointment:   In Person  Provider:   You may see  one of the following Advanced Practice Providers on your designated Care Team:    Chanetta Marshall, NP  Tommye Standard, PA-C  Legrand Como "[place]" [place], [place]    Thank you for choosing [facility]!!   Trinidad Curet, RN [phone]    Other Instructions

## 2020-05-17 ENCOUNTER — Encounter (HOSPITAL_COMMUNITY): Payer: Self-pay | Admitting: Emergency Medicine

## 2020-05-17 ENCOUNTER — Emergency Department (HOSPITAL_COMMUNITY): Payer: Medicare Other

## 2020-05-17 ENCOUNTER — Emergency Department (HOSPITAL_COMMUNITY)
Admission: EM | Admit: 2020-05-17 | Discharge: 2020-05-17 | Disposition: A | Discharge status: Home / Self Care | Payer: Medicare Other | Attending: Emergency Medicine | Admitting: Emergency Medicine

## 2020-05-17 DIAGNOSIS — E860 Dehydration: Secondary | ICD-10-CM | POA: Diagnosis not present

## 2020-05-17 DIAGNOSIS — R42 Dizziness and giddiness: Secondary | ICD-10-CM | POA: Diagnosis present

## 2020-05-17 DIAGNOSIS — Z7901 Long term (current) use of anticoagulants: Secondary | ICD-10-CM | POA: Diagnosis not present

## 2020-05-17 DIAGNOSIS — I4891 Unspecified atrial fibrillation: Secondary | ICD-10-CM | POA: Diagnosis not present

## 2020-05-17 DIAGNOSIS — Z79899 Other long term (current) drug therapy: Secondary | ICD-10-CM | POA: Insufficient documentation

## 2020-05-17 DIAGNOSIS — N3001 Acute cystitis with hematuria: Secondary | ICD-10-CM | POA: Insufficient documentation

## 2020-05-17 DIAGNOSIS — R5383 Other fatigue: Secondary | ICD-10-CM | POA: Diagnosis not present

## 2020-05-17 DIAGNOSIS — I2510 Atherosclerotic heart disease of native coronary artery without angina pectoris: Secondary | ICD-10-CM | POA: Diagnosis not present

## 2020-05-17 DIAGNOSIS — D649 Anemia, unspecified: Secondary | ICD-10-CM | POA: Diagnosis not present

## 2020-05-17 DIAGNOSIS — Z87891 Personal history of nicotine dependence: Secondary | ICD-10-CM | POA: Diagnosis not present

## 2020-05-17 DIAGNOSIS — I10 Essential (primary) hypertension: Secondary | ICD-10-CM | POA: Diagnosis not present

## 2020-05-17 LAB — COMPREHENSIVE METABOLIC PANEL
ALT: 62 U/L — ABNORMAL HIGH (ref 0–44)
AST: 53 U/L — ABNORMAL HIGH (ref 15–41)
Albumin: 2.8 g/dL — ABNORMAL LOW (ref 3.5–5.0)
Alkaline Phosphatase: 37 U/L — ABNORMAL LOW (ref 38–126)
Anion gap: 9 (ref 5–15)
BUN: 20 mg/dL (ref 8–23)
CO2: 22 mmol/L (ref 22–32)
Calcium: 7.8 mg/dL — ABNORMAL LOW (ref 8.9–10.3)
Chloride: 107 mmol/L (ref 98–111)
Creatinine, Ser: 0.87 mg/dL (ref 0.44–1.00)
GFR calc Af Amer: >60 mL/min (ref >60)
GFR calc non Af Amer: >60 mL/min (ref >60)
Glucose, Bld: 115 mg/dL — ABNORMAL HIGH (ref 70–99)
Potassium: 4.4 mmol/L (ref 3.5–5.1)
Sodium: 138 mmol/L (ref 135–145)
Total Bilirubin: 1 mg/dL (ref 0.3–1.2)
Total Protein: 5.7 g/dL — ABNORMAL LOW (ref 6.5–8.1)

## 2020-05-17 LAB — CBC WITH DIFFERENTIAL/PLATELET
Abs Immature Granulocytes: 0.31 10*3/uL — ABNORMAL HIGH (ref 0.00–0.07)
Basophils Absolute: 0 10*3/uL (ref 0.0–0.1)
Basophils Relative: 0 %
Eosinophils Absolute: 0 10*3/uL (ref 0.0–0.5)
Eosinophils Relative: 0 %
HCT: 27.4 % — ABNORMAL LOW (ref 36.0–46.0)
Hemoglobin: 8.2 g/dL — ABNORMAL LOW (ref 12.0–15.0)
Immature Granulocytes: 2 %
Lymphocytes Relative: 8 %
Lymphs Abs: 1.1 10*3/uL (ref 0.7–4.0)
MCH: 32.9 pg (ref 26.0–34.0)
MCHC: 29.9 g/dL — ABNORMAL LOW (ref 30.0–36.0)
MCV: 110 fL — ABNORMAL HIGH (ref 80.0–100.0)
Monocytes Absolute: 0.6 10*3/uL (ref 0.1–1.0)
Monocytes Relative: 5 %
Neutro Abs: 11.7 10*3/uL — ABNORMAL HIGH (ref 1.7–7.7)
Neutrophils Relative %: 85 %
Platelets: 160 10*3/uL (ref 150–400)
RBC: 2.49 MIL/uL — ABNORMAL LOW (ref 3.87–5.11)
RDW: 13.3 % (ref 11.5–15.5)
WBC: 13.7 10*3/uL — ABNORMAL HIGH (ref 4.0–10.5)
nRBC: 0.4 % — ABNORMAL HIGH (ref 0.0–0.2)

## 2020-05-17 LAB — URINALYSIS, ROUTINE W REFLEX MICROSCOPIC
Bilirubin Urine: NEGATIVE
Glucose, UA: NEGATIVE mg/dL
Ketones, ur: NEGATIVE mg/dL
Nitrite: NEGATIVE
Protein, ur: NEGATIVE mg/dL
Specific Gravity, Urine: 1.01 (ref 1.005–1.030)
WBC, UA: >50 WBC/hpf — ABNORMAL HIGH (ref 0–5)
pH: 6 (ref 5.0–8.0)

## 2020-05-17 LAB — TROPONIN I (HIGH SENSITIVITY)
Troponin I (High Sensitivity): 12 ng/L (ref <18)
Troponin I (High Sensitivity): 12 ng/L (ref <18)

## 2020-05-17 LAB — BRAIN NATRIURETIC PEPTIDE: B Natriuretic Peptide: 237.4 pg/mL — ABNORMAL HIGH (ref 0.0–100.0)

## 2020-05-17 LAB — POC OCCULT BLOOD, ED: Fecal Occult Bld: NEGATIVE

## 2020-05-17 MED ORDER — CEPHALEXIN 500 MG PO CAPS
500.0000 mg | ORAL_CAPSULE | Freq: Three times a day (TID) | ORAL | 0 refills | Status: DC
Start: 2020-05-17 — End: 2020-08-14

## 2020-05-17 MED ORDER — CALCIUM CARBONATE ANTACID 500 MG PO CHEW
1.0000 | CHEWABLE_TABLET | Freq: Once | ORAL | Status: AC
  Administered 2020-05-17: 200 mg via ORAL
  Filled 2020-05-17: qty 1

## 2020-05-17 MED ORDER — CALCIUM GLUCONATE 500 MG PO TABS: 1.0000 | ORAL_TABLET | Freq: Every day | ORAL | 0 refills | Status: DC

## 2020-05-17 MED ORDER — SODIUM CHLORIDE 0.9 % IV SOLN
1.0000 g | Freq: Once | INTRAVENOUS | Status: AC
  Administered 2020-05-17: 1 g via INTRAVENOUS
  Filled 2020-05-17: qty 10

## 2020-05-17 NOTE — Discharge Instructions (Signed)
Please take antibiotics as prescribed.  Please follow-up with your primary care doctor early next week for follow-up on urine culture and for recheck of hemoglobin.  You are mildly anemic today which is consistent with your baseline chronic anemia.  Please continue take your iron supplement.  Also the mildly low calcium I have prescribed you a calcium supplement to take once daily.  Please drink plenty of water to stay hydrated.  Please return to ED for any new or concerning symptoms.

## 2020-05-17 NOTE — ED Provider Notes (Signed)
[facility] DEPT Provider Note   CSN: [id number] Arrival date & time: 05/17/20  1059     History Chief Complaint  Patient presents with  . Dizziness    Michele Green is a 79 y.o. female.  HPI 79 year old female with a past medical history of atrial fibrillation Cardizem and metoprolol and Xarelto 50 mg tablets daily.  She also takes Lasix 20 mg as needed for leg swelling.  Patient presents emergency department today for evaluation of bilateral lower extremity weakness.   Patient states that she was recently seen by her cardiologist and had her Cardizem dose increased from 180 mg extended release to 240.  She states that she was on her way home from that very appointment before she took her extended release increased dose of Cardizem when she had a mechanical fall in a [place] and states she fell onto her left shoulder.  She had some achy pain in that area since.  She has some bruising that she has noticed on her arm she states that she usually has bruises.  This is normal for her.  She states she has a history of anemia and is concern for low blood counts today.  She states that she has had no episodes of vertiginous symptoms but states that today when she was getting out of bed she felt that she was concerned that she was going to be lightheaded but was not when standing.  She states that instead she felt "weak "in bilateral lower extremities.  She denies any other focal weakness.  She denies any upper extremity or grip strength decreased.  She denies any lightheadedness, nausea, vomiting, chest pain or shortness of breath.  Patient is chronically in atrial fibrillation she has had Maze procedure done as well as DC cardioversion however seems to be chronically in A. fib.  She is rate controlled on metoprolol and Cardizem.  Has no other recent interventions other than increasing Cardizem.  Patient states she did not strike her head when she fell on the 22nd of  this month which is when she had her Cardizem increased.   Patient takes iron supplements for her chronic anemia and states that her stool is dark but has not changed at all recently.  She denies any vaginal bleeding.    Past Medical History:  Diagnosis Date  . Arrhythmia   . Atypical atrial flutter ([place]) 08/14/2018   Post-operative  . Breast cancer of upper-outer quadrant of left female breast ([place]) 02/02/2016  . CAD (coronary artery disease) 06/20/2018   LHC 7/19: pLAD 65/90, oD1 90, mLCx 85, OM2 50, oRCA 30, EF 50-55 >> s/p CABG in 9/19 (L-LAD)  . Colon polyp 02/2012  . Dupuytren contracture    bilateral hands  . Family history of breast cancer   . Hyperlipidemia   . Hypertension   . Mitral regurgitation 11/07/2013   Echo 7/19: Mild LVH, EF 60-65, no RWMA, mod ot severe MR, massive LAE, PASP 33 // TEE 7/19:  Mild conc LVH, EF 60-65, no RWMA, severe MR with mild post leaflet prolapse, massive // s/p MV repair 07/2018  . On continuous oral anticoagulation 08/22/2015   Started on Xarelto 08/12/2015   . Osteopenia   . Persistent atrial fibrillation ([place]) 08/22/2015   Started late August or early September 2016 // s/p Maze procedure 07/2018  . Personal history of radiation therapy   . Radiation Therapy 04/21/16-05/19/16   left breast 47.72 Gy, boosted to 10 Gy  . 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 S/P Maze operation for atrial fibrillation 08/10/2018   Complete bilateral atrial lesion set using bipolar radiofrequency and cryothermy ablation with clipping of LA appendage  . S/P MVR (mitral valve repair) 08/10/2018   Complex valvuloplasty including Gore-tex neochord placement x8, Plication of Lateral Commissure and 76m Sorin Memo 4D Ring Annuloplasty SN# [id number] . Ulcerative colitis ([facility]   . Wears glasses     Patient Active Problem List   Diagnosis Date Noted  . Long term (current) use of anticoagulants 08/28/2018  . A[place]ical atrial flutter ([place] 08/14/2018  . S/P CABG x 1  08/10/2018  . 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 S/P Maze operation for atrial fibrillation 08/10/2018  . Chronic diastolic HF (heart failure) ([place] 06/20/2018  . Osteoporosis 09/29/2017  . Loosening of hardware in spine ([place] 08/23/2017  . Spinal stenosis of lumbar region 08/11/2016  . Unspecified cord compression ([place] 08/11/2016  . Unstable burst fracture of third lumbar vertebra ([facility] [place] 08/11/2016  . Genetic testing 03/17/2016  . Family history of breast cancer   . Breast cancer of upper-outer quadrant of left female breast ([place] 02/02/2016  . On amiodarone therapy 09/09/2015  . On continuous oral anticoagulation 08/22/2015  . Persistent atrial fibrillation ([place] 08/22/2015  . Essential hypertension 11/07/2013  . Mitral regurgitation 11/07/2013    Past Surgical History:  Procedure Laterality Date  . BREAST BIOPSY Left 01/28/2016  . BREAST LUMPECTOMY Left 02/23/2016  . BREAST LUMPECTOMY WITH RADIOACTIVE SEED LOCALIZATION Left 02/23/2016   Procedure: BREAST LUMPECTOMY WITH RADIOACTIVE SEED LOCALIZATION;  Surgeon: BExcell Seltzer MD;  [place]  Service: General;  Laterality: Left;  . CARDIAC CATHETERIZATION    . CARDIOVERSION N/A 10/02/2015   Procedure: CARDIOVERSION;  Surgeon: MJerline Pain MD;  [facility];  Service: Cardiovascular;  Laterality: N/A;  . CARDIOVERSION N/A 10/05/2018   Procedure: CARDIOVERSION;  Surgeon: RFay Records MD;  [facility];  Service: Cardiovascular;  Laterality: N/A;  . CARDIOVERSION N/A 11/05/2019   Procedure: CARDIOVERSION;  Surgeon: NDorothy Spark MD;  Location[place]  Service: Cardiovascular;  Laterality: N/A;  . CLIPPING OF ATRIAL APPENDAGE  08/10/2018   Procedure: CLIPPING OF ATRIAL APPENDAGE;  Surgeon: ORexene Alberts MD;  [place]  Service: Open Heart Surgery;;  . COLONOSCOPY    . CORONARY ARTERY BYPASS GRAFT N/A 08/10/2018   Procedure: CORONARY ARTERY BYPASS GRAFTING  (CABG) x 1, LIMA-LAD,  USING LEFT INTERNAL MAMMARY ARTERY. HARVESTED RIGHT GREATER SAPHENOUS VEIN ENDOSCOPICALLY;  Surgeon: ORexene Alberts MD;  [place]  Service: Open Heart Surgery;  Laterality: N/A;  . CYSTOSCOPY W/ RETROGRADES Bilateral 06/20/2019   Procedure: CYSTOSCOPY WITH RETROGRADE PYELOGRAM;  Surgeon: HArdis Hughs MD;  [facility]  Service: Urology;  Laterality: Bilateral;  . CYSTOSCOPY WITH HYDRODISTENSION AND BIOPSY N/A 06/20/2019   Procedure: CYSTOSCOPY BLADDER  BIOPSY WITH FULGERATION;  Surgeon: HArdis Hughs MD;  [facility]  Service: Urology;  Laterality: N/A;  . DUPUYTREN CONTRACTURE RELEASE  2001   leftx2  . [place]  right  . DUPUYTREN CONTRACTURE RELEASE Right 05/02/2014   Procedure: EXCISION DUPUYTRENS RIGHT PALMAR/SMALL ;  Surgeon: RCammie Sickle MD;  [place]  Service: Orthopedics;  Laterality: Right;  .Marland KitchenMAZE N/A 08/10/2018   Procedure: MAZE;  Surgeon: ORexene Alberts MD;  [place]  Service: Open  Heart Surgery;  Laterality: N/A;  . MITRAL VALVE REPAIR N/A 08/10/2018   Procedure: MITRAL VALVE REPAIR (MVR);  Surgeon: Rexene Alberts, MD;  Location: [facility];  Service: Open Heart Surgery;  Laterality: N/A;  glutaraldehyde  . RIGHT/LEFT HEART CATH AND CORONARY ANGIOGRAPHY N/A 06/20/2018   Procedure: RIGHT/LEFT HEART CATH AND CORONARY ANGIOGRAPHY;  Surgeon: [name], MD;  Location: [place] [facility];  Service: Cardiovascular;  Laterality: N/A;  . TEE WITHOUT CARDIOVERSION N/A 06/20/2018   Procedure: TRANSESOPHAGEAL ECHOCARDIOGRAM (TEE);  Surgeon: Pixie Casino, MD;  [facility];  Service: Cardiovascular;  Laterality: N/A;  . TEE WITHOUT CARDIOVERSION N/A 08/10/2018   Procedure: TRANSESOPHAGEAL ECHOCARDIOGRAM (TEE);  Surgeon: Rexene Alberts, MD;  [place];  Service: Open Heart Surgery;  Laterality: N/A;  .  TONSILLECTOMY  age 49     OB History    Gravida  1   Para  1   Term  1   Preterm      AB      Living  1     SAB      TAB      Ectopic      Multiple      Live Births              Family History  Problem Relation Age of Onset  . Cirrhosis Mother   . Breast cancer Mother 10  . Heart failure Father   . Heart attack Father   . Breast cancer Sister        early 61's  . Kidney Stones Sister        loss of kidney due to stones  . Osteoporosis Neg Hx     Social History   Tobacco Use  . Smoking status: Former Smoker    Packs/day: 1.00    Years: 20.00    Pack years: 20.00    Types: Cigarettes    Quit date: 11/22/1992    Years since quitting: 27.5  . Smokeless tobacco: Never Used  Vaping Use  . Vaping Use: Never used  Substance Use Topics  . Alcohol use: Yes    Alcohol/week: 3.0 standard drinks    Types: 3 Standard drinks or equivalent per week    Comment: social  . Drug use: No    Home Medications Prior to Admission medications   Medication Sig Start Date End Date Taking? Authorizing Provider  acetaminophen (TYLENOL) 500 MG tablet Take 500-1,000 mg by mouth every 6 (six) hours as needed for moderate pain or headache.   Yes [provider]  allopurinol (ZYLOPRIM) 100 MG tablet Take 200 mg by mouth daily.  06/21/13  Yes [provider]  amoxicillin (AMOXIL) 500 MG tablet Take 4 tablets (2,000 mg total) by mouth as needed (TO BE USED FOR DENTAL PROCEDURE). Take 4 tabs 30-60 minutes before dental cleaning/procedure 04/01/20  Yes [name], MD  atorvastatin (LIPITOR) 20 MG tablet TAKE 1 TABLET BY MOUTH EVERY DAY 04/07/20  Yes Camnitz, Ocie Doyne, MD  diltiazem (CARDIZEM CD) 240 MG 24 hr capsule Take 1 capsule (240 mg total) by mouth daily. 05/13/20  Yes Camnitz, Will Hassell Done, MD  furosemide (LASIX) 20 MG tablet Take 20 mg by mouth daily as needed (for swelling/fluid retention.).    Yes [provider]  mesalamine (APRISO) 0.375 g 24  hr capsule Take 1.5 g by mouth daily.  06/01/19  Yes [provider]  metoprolol tartrate (LOPRESSOR) 100 MG tablet TAKE 1 TABLET  BY MOUTH TWICE A DAY 03/10/20  Yes [name], MD  phenazopyridine (PYRIDIUM) 200 MG tablet Take 1 tablet (200 mg total) by mouth 3 (three) times daily as needed for pain. 06/20/19  Yes Ardis Hughs, MD  predniSONE (STERAPRED UNI-PAK 21 TAB) 10 MG (21) TBPK tablet Take 10 mg by mouth daily. Take as directed - for C-Diff   Yes [provider]  tamoxifen (NOLVADEX) 20 MG tablet TAKE 1 TABLET BY MOUTH EVERY DAY 03/03/20  Yes Truitt Merle, MD  traMADol (ULTRAM) 50 MG tablet Take 1-2 tablets (50-100 mg total) by mouth every 6 (six) hours as needed for moderate pain. 06/20/19  Yes Ardis Hughs, MD  XARELTO 15 MG TABS tablet TAKE 1 TABLET BY MOUTH  DAILY WITH SUPPER Patient taking differently: Take 15 mg by mouth daily with supper.  03/24/20  Yes [name], MD  calcium gluconate 500 MG tablet Take 1 tablet (500 mg total) by mouth daily for 5 days. 05/17/20 05/22/20  Tedd Sias, PA  cephALEXin (KEFLEX) 500 MG capsule Take 1 capsule (500 mg total) by mouth 3 (three) times daily. 05/17/20   Tedd Sias, PA  KLOR-CON M20 20 MEQ tablet TAKE 1 TABLET BY MOUTH EVERY DAY Patient not taking: Reported on 05/17/2020 05/21/19   [name], MD    Allergies    Shellfish allergy  Review of Systems   Review of Systems  Constitutional: Positive for fatigue. Negative for chills and fever.  HENT: Negative for congestion.   Eyes: Negative for pain.  Respiratory: Negative for cough and shortness of breath.   Cardiovascular: Negative for chest pain and leg swelling.  Gastrointestinal: Negative for abdominal pain, diarrhea, nausea and vomiting.  Genitourinary: Negative for dysuria.  Musculoskeletal: Negative for myalgias.  Skin: Negative for rash.  Neurological: Negative for dizziness and headaches.       Bilateral lower extremity weakness     Physical Exam Updated Vital Signs BP 108/64   Pulse 95   Temp 97.8 F (36.6 C) (Oral)   Resp 19   SpO2 100%   Physical Exam Vitals and nursing note reviewed.  Constitutional:      General: She is not in acute distress.    Comments: Pleasant 79 year old female appears stated age sitting comfortably in bed able answer questions appropriately follow commands.  HENT:     Head: Normocephalic and atraumatic.     Comments: Head is atraumatic.    Nose: Nose normal.     Mouth/Throat:     Mouth: Mucous membranes are moist.  Eyes:     General: No scleral icterus. Neck:     Comments: Full range of motion of neck, no midline tenderness. Cardiovascular:     Rate and Rhythm: Normal rate and regular rhythm.     Pulses: Normal pulses.     Heart sounds: Normal heart sounds.  Pulmonary:     Effort: Pulmonary effort is normal. No respiratory distress.     Breath sounds: No wheezing.  Abdominal:     Palpations: Abdomen is soft.     Tenderness: There is no abdominal tenderness. There is no right CVA tenderness, left CVA tenderness, guarding or rebound.  Musculoskeletal:     Cervical back: Normal range of motion.     Right lower leg: No edema.     Left lower leg: No edema.  Skin:    General: Skin is warm and dry.     Capillary Refill: Capillary refill takes less  than 2 seconds.     Comments: Diffuse bruising on bilateral upper and lower extremities.  Neurological:     Mental Status: She is alert. Mental status is at baseline.     Comments: Alert and oriented to self, place, time and event.   Speech is fluent, clear without dysarthria or dysphasia.   Strength 5/5 in upper/lower extremities  Sensation intact in upper/lower extremities   Normal gait.  Negative Romberg. No pronator drift.  Normal finger-to-nose and feet tapping.  CN I not tested  CN II grossly intact visual fields bilaterally. Did not visualize posterior eye.   CN III, IV, VI PERRLA and EOMs intact bilaterally   CN V Intact sensation to sharp and light touch to the face  CN VII facial movements symmetric  CN VIII not tested  CN IX, X no uvula deviation, symmetric rise of soft palate  CN XI 5/5 SCM and trapezius strength bilaterally  CN XII Midline tongue protrusion, symmetric L/R movements   Psychiatric:        Mood and Affect: Mood normal.        Behavior: Behavior normal.     ED Results / Procedures / Treatments   Labs (all labs ordered are listed, but only abnormal results are displayed) Labs Reviewed  CBC WITH DIFFERENTIAL/PLATELET - Abnormal; Notable for the following components:      Result Value   WBC 13.7 (*)    RBC 2.49 (*)    Hemoglobin 8.2 (*)    HCT 27.4 (*)    MCV 110.0 (*)    MCHC 29.9 (*)    nRBC 0.4 (*)    Neutro Abs 11.7 (*)    Abs Immature Granulocytes 0.31 (*)    All other components within normal limits  COMPREHENSIVE METABOLIC PANEL - Abnormal; Notable for the following components:   Glucose, Bld 115 (*)    Calcium 7.8 (*)    Total Protein 5.7 (*)    Albumin 2.8 (*)    AST 53 (*)    ALT 62 (*)    Alkaline Phosphatase 37 (*)    All other components within normal limits  BRAIN NATRIURETIC PEPTIDE - Abnormal; Notable for the following components:   B Natriuretic Peptide 237.4 (*)    All other components within normal limits  URINALYSIS, ROUTINE W REFLEX MICROSCOPIC - Abnormal; Notable for the following components:   APPearance HAZY (*)    Hgb urine dipstick MODERATE (*)    Leukocytes,Ua LARGE (*)    WBC, UA >50 (*)    Bacteria, UA MANY (*)    All other components within normal limits  URINE CULTURE  POC OCCULT BLOOD, ED  TROPONIN I (HIGH SENSITIVITY)  TROPONIN I (HIGH SENSITIVITY)    EKG EKG Interpretation  Date/Time:  Saturday May 17 2020 11:28:21 EDT Ventricular Rate:  83 PR Interval:    QRS Duration: 94 QT Interval:  361 QTC Calculation: 425 R Axis:   77 Text Interpretation: Atrial fibrillation RSR' in V1 or V2, probably normal variant  Repol abnrm suggests ischemia, diffuse leads Since last tracing more diffuse ST segment depression Confirmed by Daleen Bo [phone]) on 05/17/2020 12:52:42 PM   Radiology DG Chest Portable 1 View  Result Date: 05/17/2020 CLINICAL DATA:  Pt fell 2 days prior. Small bruise on lateral aspect of left shoulder with minimal pain. EXAM: PORTABLE CHEST 1 VIEW COMPARISON:  09/11/2018 FINDINGS: Stable changes from previous cardiac surgery and valve replacement. Cardiac silhouette is borderline enlarged. No mediastinal or hilar  masses. Lungs are clear.  No pleural effusion or pneumothorax. Surgical clips in the left breast consistent with previous left breast surgery. Skeletal structures are demineralized but grossly intact. IMPRESSION: No acute cardiopulmonary disease. Electronically Signed   By: Lajean Manes M.D.   On: 05/17/2020 13:08   DG Shoulder Left Portable  Result Date: 05/17/2020 CLINICAL DATA:  Pt fell 2 days prior. Small bruise on lateral aspect of left shoulder with minimal pain. EXAM: LEFT SHOULDER COMPARISON:  None. FINDINGS: There is no evidence of fracture or dislocation. There is no evidence of arthropathy or other focal bone abnormality. Soft tissues are unremarkable. IMPRESSION: Negative. Electronically Signed   By: Lajean Manes M.D.   On: 05/17/2020 13:06    Procedures Procedures (including critical care time)  Medications Ordered in ED Medications  cefTRIAXone (ROCEPHIN) 1 g in sodium chloride 0.9 % 100 mL IVPB (0 g Intravenous Stopped 05/17/20 1614)  calcium carbonate (TUMS - dosed in mg elemental calcium) chewable tablet 200 mg of elemental calcium (200 mg of elemental calcium Oral Given 05/17/20 1623)    ED Course  I have reviewed the triage vital signs and the nursing notes.  Pertinent labs & imaging results that were available during my care of the patient were reviewed by me and considered in my medical decision making (see chart for details).  Patient is 79 year old  female with past medical history detailed below significant for A. fib on rate control.  She had recent increase in her Cardizem dose.  Has felt weak and somewhat lightheaded since that time.  Physical exam is unremarkable here she was ambulatory and had normal vital signs with no tachycardia or hypotension.  She is not orthostatic and her heart rate and blood pressure stayed unchanged during this exam.  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  She does not appear fluid overloaded today has no lower extremity edema.  Very mild JVD no crackles.  Troponin within normal limits.  Patient has evidence of UTI and urinalysis with many bacteria, WBC greater than 50 and large leukocytes with moderate hemoglobin.  Urinalysis sent for culture.  She does have mild leukocytosis of 13..  She does have some anemia today with a hemoglobin of 8.2 however if very minimal symptoms as she has demonstrated today I discussed my today physician Dr. Eulis Foster who agreed medication for transfusion at this time.  Patient does have a CMP with notable hypo-Cal which I replaced with.  Calcium carbonate here and discharged with several days of calcium supplementation.  She will continue take her iron at home.  EKG with stable A. fib reviewed by attending physician myself.  No acute abnormality.  Patient agreeable plan to discharge at this time.  She has ambulated and tolerated p.o. without difficulty.  She has received 1 g of Rocephin and will be discharged with Keflex urine culture pending at this time she will follow up closely with her primary care doctor.  Clinical Course as of May 17 2024  Sat May 17, 2020  1336 Chest x-ray reviewed by myself.  No infiltrate or acute abnormality.  Agree of radiology read normal x-ray.   [WF]  1336 Left shoulder x-ray negative for any notable fracture.  Clavicle and proximal humerus appear intact.  This  is consistent with my exam which had very low suspicion for fracture.  Agree w radiology read.   [WF]    Clinical Course User Index [WF]  Tedd Sias, [place]   MDM Rules/Calculators/A&P                          Final Clinical Impression(s) / ED Diagnoses Final diagnoses:  Other fatigue  Anemia, unspecified type  Hypocalcemia  Dehydration  Acute cystitis with hematuria    Rx / DC Orders ED Discharge Orders         Ordered    cephALEXin (KEFLEX) 500 MG capsule  3 times daily     Discontinue  Reprint     05/17/20 1614    calcium gluconate 500 MG tablet  Daily     Discontinue  Reprint     05/17/20 [employer], Kirsten Mckone S, [place] 05/17/20 2026    Daleen Bo, MD 05/17/20 2037

## 2020-05-17 NOTE — ED Notes (Signed)
ED Provider at bedside. 

## 2020-05-17 NOTE — ED Provider Notes (Signed)
  Face-to-face evaluation   History: She complains of a sensation of dizziness for several days.  She recently had her Cardizem dose increased because of high heart rate.  She presents complaining of 3 days of intermittent dizziness, specified as a spinning sensation.  She denies headache, chest pain, cough, shortness of breath, abdominal or back pain.  She had a recent fall, without injury, 4 days ago.  Physical exam: Alert elderly female.  No dysarthria aphasia no pronator drift.  No ataxia.  No nystagmus.  Negative test of skew.  Normal head impulse testing.  Medical screening examination/treatment/procedure(s) were conducted as a shared visit with non-physician practitioner(s) and myself.  I personally evaluated the patient during the encounter    Daleen Bo, MD 05/17/20 2037

## 2020-05-17 NOTE — ED Notes (Signed)
Patient reports she is calling sister to provide transport home.

## 2020-05-17 NOTE — ED Triage Notes (Addendum)
Per EMS, patient from home, c/o dizziness x3 days ago. Reports medication change x3 days ago. States when she takes medication she is not dizzy. Unable to state which medication. Denies dizziness at this time because she took medication today. Denies any complaints.  Upon further assessment, patient reports since having Cardizem dose increased she has worsening dizziness. States she lives alone and has fear of falling due to dizziness.

## 2020-05-17 NOTE — ED Notes (Signed)
XR at bedside

## 2020-05-17 NOTE — ED Notes (Signed)
Purewick applied to patient.

## 2020-05-19 ENCOUNTER — Telehealth: Payer: Self-pay | Admitting: Interventional Cardiology

## 2020-05-19 MED ORDER — DILTIAZEM HCL ER COATED BEADS 180 MG PO CP24
180.0000 mg | ORAL_CAPSULE | Freq: Every day | ORAL | 1 refills | Status: DC
Start: 2020-05-19 — End: 2020-08-14

## 2020-05-19 NOTE — Telephone Encounter (Signed)
This is prescribed by Dr. Curt Bears.

## 2020-05-19 NOTE — Telephone Encounter (Signed)
Spoke to pt's sister, DPR on file. Sister reports that "she is going through a lot with her" and trying to help her w/ her medical things. Informed that Dr Curt Bears recommends going back down to Diltiazem 180 mg once daily. Sister is agreeable to plan and will inform pt of recommendation. Aware I will send in updated Rx to pharmacy

## 2020-05-19 NOTE — Telephone Encounter (Signed)
New message   Pt c/o medication issue:  1. Name of Medication: diltiazem (CARDIZEM CD) 240 MG 24 hr capsule  2. How are you currently taking this medication (dosage and times per day)?as written  3. Are you having a reaction (difficulty breathing--STAT)? No   4. What is your medication issue? Patient states that this medication is causing dizziness. Please call.

## 2020-05-20 LAB — URINE CULTURE: Culture: >=100000 — AB

## 2020-05-21 ENCOUNTER — Telehealth: Payer: Self-pay | Admitting: Emergency Medicine

## 2020-05-21 NOTE — Telephone Encounter (Signed)
Post ED Visit - Positive Culture Follow-up  Culture report reviewed by antimicrobial stewardship pharmacist: [place] Team []  Elenor Quinones, Pharm.D. []  Heide Guile, Pharm.D., BCPS AQ-ID []  Parks Neptune, Pharm.D., BCPS []  Alycia Rossetti, Pharm.D., BCPS []  [place], Pharm.D., BCPS, AAHIVP []  Legrand Como, Pharm.D., BCPS, AAHIVP []  Salome Arnt, PharmD, BCPS []  Johnnette Gourd, PharmD, BCPS []  Hughes Better, PharmD, BCPS []  Leeroy Cha, PharmD []  Laqueta Linden, PharmD, BCPS []  Albertina Parr, PharmD  [place] Team []  Leodis Sias, PharmD []  Lindell Spar, PharmD []  Royetta Asal, PharmD []  Graylin Shiver, Rph []  Rema Fendt) Glennon Mac, PharmD []  Arlyn Dunning, PharmD []  Netta Cedars, PharmD []  Dia Sitter, PharmD []  Leone Haven, PharmD []  Gretta Arab, PharmD []  Theodis Shove, PharmD []  Peggyann Juba, PharmD []  Reuel Boom, PharmD [facility] PharmD   Positive urine culture Treated with cephalexin, organism sensitive to the same and no further patient follow-up is required at this time.  Hazle Nordmann 05/21/2020, 12:32 PM

## 2020-05-22 ENCOUNTER — Ambulatory Visit
Admission: RE | Admit: 2020-05-22 | Discharge: 2020-05-22 | Disposition: A | Discharge status: Home / Self Care | Payer: Medicare Other | Source: Ambulatory Visit | Attending: Physician Assistant | Admitting: Physician Assistant

## 2020-05-22 ENCOUNTER — Other Ambulatory Visit: Payer: Self-pay

## 2020-05-22 DIAGNOSIS — K51919 Ulcerative colitis, unspecified with unspecified complications: Secondary | ICD-10-CM

## 2020-05-22 DIAGNOSIS — K625 Hemorrhage of anus and rectum: Secondary | ICD-10-CM

## 2020-05-22 DIAGNOSIS — R197 Diarrhea, unspecified: Secondary | ICD-10-CM

## 2020-05-22 DIAGNOSIS — R109 Unspecified abdominal pain: Secondary | ICD-10-CM

## 2020-05-22 MED ORDER — IOPAMIDOL (ISOVUE-300) INJECTION 61%
100.0000 mL | Freq: Once | INTRAVENOUS | Status: AC | PRN
  Administered 2020-05-22: 100 mL via INTRAVENOUS

## 2020-05-29 ENCOUNTER — Other Ambulatory Visit (HOSPITAL_COMMUNITY): Payer: Self-pay | Admitting: *Deleted

## 2020-05-30 ENCOUNTER — Ambulatory Visit (HOSPITAL_COMMUNITY)
Admission: RE | Admit: 2020-05-30 | Discharge: 2020-05-30 | Disposition: A | Discharge status: Home / Self Care | Payer: Medicare Other | Source: Ambulatory Visit | Attending: Gastroenterology | Admitting: Gastroenterology

## 2020-05-30 ENCOUNTER — Other Ambulatory Visit: Payer: Self-pay

## 2020-05-30 DIAGNOSIS — D500 Iron deficiency anemia secondary to blood loss (chronic): Secondary | ICD-10-CM | POA: Insufficient documentation

## 2020-05-30 MED ORDER — SODIUM CHLORIDE 0.9 % IV SOLN
510.0000 mg | INTRAVENOUS | Status: DC
  Administered 2020-05-30: 510 mg via INTRAVENOUS
  Filled 2020-05-30: qty 17

## 2020-05-30 NOTE — Discharge Instructions (Signed)

## 2020-06-06 ENCOUNTER — Encounter (HOSPITAL_COMMUNITY): Payer: Medicare Other

## 2020-06-06 ENCOUNTER — Encounter (HOSPITAL_COMMUNITY)
Admission: RE | Admit: 2020-06-06 | Discharge: 2020-06-06 | Disposition: A | Discharge status: Home / Self Care | Payer: Medicare Other | Source: Ambulatory Visit | Attending: Gastroenterology | Admitting: Gastroenterology

## 2020-06-06 ENCOUNTER — Other Ambulatory Visit: Payer: Self-pay

## 2020-06-06 DIAGNOSIS — D509 Iron deficiency anemia, unspecified: Secondary | ICD-10-CM | POA: Diagnosis present

## 2020-06-06 MED ORDER — SODIUM CHLORIDE 0.9 % IV SOLN
510.0000 mg | INTRAVENOUS | Status: DC
  Administered 2020-06-06: 510 mg via INTRAVENOUS
  Filled 2020-06-06: qty 17

## 2020-06-20 ENCOUNTER — Other Ambulatory Visit: Payer: Self-pay | Admitting: Gastroenterology

## 2020-06-30 ENCOUNTER — Ambulatory Visit: Payer: Medicare Other | Admitting: Thoracic Surgery (Cardiothoracic Vascular Surgery)

## 2020-06-30 ENCOUNTER — Telehealth: Payer: Self-pay | Admitting: *Deleted

## 2020-06-30 NOTE — Telephone Encounter (Signed)
° °  Primary Cardiologist: Sinclair Grooms, MD  Chart reviewed as part of pre-operative protocol coverage. Given past medical history and time since last visit, based on ACC/AHA guidelines, Michele Green would be at acceptable risk for the planned procedure without further cardiovascular testing.   Patient with diagnosis of afib on Xarelto for anticoagulation.    Procedure: colonoscopy Date of procedure: 07/14/20  CHADS2-VASc score of 6 (age x2, sex, CHF, HTN, CAD)  CrCl 41m/min (on appropriate dose of Xarelto) Platelet count 160K  Request is to hold DOAC for 3 days. Per office protocol, patient can hold Xarelto for 2 days prior to procedure based on bleed risk of procedure.  I will route this recommendation to the requesting party via Epic fax function and remove from pre-op pool.  Please call with questions.  JJossie Ng [name] NP-C    06/30/2020, 10:35 AM [place]Progress[address] Office ([phone] [phone]

## 2020-06-30 NOTE — Telephone Encounter (Signed)
   [facility] Pre-operative Risk Assessment    HEARTCARE STAFF: - Please ensure there is not already an duplicate clearance open for this procedure. - Under Visit Info/Reason for Call, type in Other and utilize the format Clearance MM/DD/YY or Clearance TBD. Do not use dashes or single digits. - If request is for dental extraction, please clarify the # of teeth to be extracted.  Request for surgical clearance:  1. What type of surgery is being performed? COLONOSCOPY   2. When is this surgery scheduled? 07/14/20   3. What type of clearance is required (medical clearance vs. Pharmacy clearance to hold med vs. Both)? BOTH  4. Are there any medications that need to be held prior to surgery and how long? XARELTO x 3 DAYS PER REQUESTING PROVIDER   5. Practice name and name of physician performing surgery? [facility]; DR. Therisa Green   6. What is the office phone number? [phone]   7.   What is the office fax number? [phone]  8.   Anesthesia type (None, local, MAC, general) ? PROPOFOL   Michele Green 06/30/2020, 8:58 AM  _________________________________________________________________   (provider comments below)

## 2020-06-30 NOTE — Telephone Encounter (Signed)
Patient with diagnosis of afib on Xarelto for anticoagulation.    Procedure: colonoscopy Date of procedure: 07/14/20  CHADS2-VASc score of 6 (age x2, sex, CHF, HTN, CAD)  CrCl 49m/min (on appropriate dose of Xarelto) Platelet count 160K  Request is to hold DOAC for 3 days. Per office protocol, patient can hold Xarelto for 2 days prior to procedure based on bleed risk of procedure.

## 2020-07-09 NOTE — Progress Notes (Signed)
Recieved call from patient stating she was getting multiple calls from hospital.  Did not want any more in regards to appointment on 07/14/20. Went over details for procedure and patient understands and agrees to all.

## 2020-07-10 ENCOUNTER — Other Ambulatory Visit (HOSPITAL_COMMUNITY)
Admission: RE | Admit: 2020-07-10 | Discharge: 2020-07-10 | Disposition: A | Discharge status: Home / Self Care | Payer: Medicare Other | Source: Ambulatory Visit | Attending: Gastroenterology | Admitting: Gastroenterology

## 2020-07-10 DIAGNOSIS — Z01812 Encounter for preprocedural laboratory examination: Secondary | ICD-10-CM | POA: Diagnosis present

## 2020-07-10 DIAGNOSIS — Z20822 Contact with and (suspected) exposure to covid-19: Secondary | ICD-10-CM | POA: Diagnosis not present

## 2020-07-10 LAB — SARS CORONAVIRUS 2 (TAT 6-24 HRS): SARS Coronavirus 2: NEGATIVE

## 2020-07-14 ENCOUNTER — Encounter (HOSPITAL_COMMUNITY)
Admission: RE | Disposition: A | Discharge status: Home / Self Care | Payer: Self-pay | Source: Home / Self Care | Attending: Gastroenterology

## 2020-07-14 ENCOUNTER — Other Ambulatory Visit: Payer: Self-pay

## 2020-07-14 ENCOUNTER — Encounter (HOSPITAL_COMMUNITY): Payer: Self-pay | Admitting: Gastroenterology

## 2020-07-14 ENCOUNTER — Ambulatory Visit (HOSPITAL_COMMUNITY): Payer: Medicare Other | Admitting: Certified Registered Nurse Anesthetist

## 2020-07-14 ENCOUNTER — Ambulatory Visit (HOSPITAL_COMMUNITY)
Admission: RE | Admit: 2020-07-14 | Discharge: 2020-07-14 | Disposition: A | Discharge status: Home / Self Care | Payer: Medicare Other | Attending: Gastroenterology | Admitting: Gastroenterology

## 2020-07-14 DIAGNOSIS — Z853 Personal history of malignant neoplasm of breast: Secondary | ICD-10-CM | POA: Insufficient documentation

## 2020-07-14 DIAGNOSIS — M109 Gout, unspecified: Secondary | ICD-10-CM | POA: Diagnosis not present

## 2020-07-14 DIAGNOSIS — M810 Age-related osteoporosis without current pathological fracture: Secondary | ICD-10-CM | POA: Insufficient documentation

## 2020-07-14 DIAGNOSIS — Z8719 Personal history of other diseases of the digestive system: Secondary | ICD-10-CM | POA: Insufficient documentation

## 2020-07-14 DIAGNOSIS — M1990 Unspecified osteoarthritis, unspecified site: Secondary | ICD-10-CM | POA: Diagnosis not present

## 2020-07-14 DIAGNOSIS — Z7901 Long term (current) use of anticoagulants: Secondary | ICD-10-CM | POA: Diagnosis not present

## 2020-07-14 DIAGNOSIS — E785 Hyperlipidemia, unspecified: Secondary | ICD-10-CM | POA: Diagnosis not present

## 2020-07-14 DIAGNOSIS — Z880 Allergy status to penicillin: Secondary | ICD-10-CM | POA: Diagnosis not present

## 2020-07-14 DIAGNOSIS — K5730 Diverticulosis of large intestine without perforation or abscess without bleeding: Secondary | ICD-10-CM | POA: Diagnosis not present

## 2020-07-14 DIAGNOSIS — I4891 Unspecified atrial fibrillation: Secondary | ICD-10-CM | POA: Insufficient documentation

## 2020-07-14 DIAGNOSIS — K648 Other hemorrhoids: Secondary | ICD-10-CM | POA: Insufficient documentation

## 2020-07-14 DIAGNOSIS — Z79899 Other long term (current) drug therapy: Secondary | ICD-10-CM | POA: Insufficient documentation

## 2020-07-14 DIAGNOSIS — Z7983 Long term (current) use of bisphosphonates: Secondary | ICD-10-CM | POA: Insufficient documentation

## 2020-07-14 DIAGNOSIS — Z951 Presence of aortocoronary bypass graft: Secondary | ICD-10-CM | POA: Insufficient documentation

## 2020-07-14 DIAGNOSIS — Z881 Allergy status to other antibiotic agents status: Secondary | ICD-10-CM | POA: Diagnosis not present

## 2020-07-14 DIAGNOSIS — Z87891 Personal history of nicotine dependence: Secondary | ICD-10-CM | POA: Insufficient documentation

## 2020-07-14 DIAGNOSIS — K529 Noninfective gastroenteritis and colitis, unspecified: Secondary | ICD-10-CM | POA: Diagnosis not present

## 2020-07-14 DIAGNOSIS — I10 Essential (primary) hypertension: Secondary | ICD-10-CM | POA: Insufficient documentation

## 2020-07-14 DIAGNOSIS — Z7952 Long term (current) use of systemic steroids: Secondary | ICD-10-CM | POA: Diagnosis not present

## 2020-07-14 DIAGNOSIS — K6389 Other specified diseases of intestine: Secondary | ICD-10-CM | POA: Diagnosis not present

## 2020-07-14 DIAGNOSIS — K51911 Ulcerative colitis, unspecified with rectal bleeding: Secondary | ICD-10-CM | POA: Diagnosis present

## 2020-07-14 DIAGNOSIS — Z79810 Long term (current) use of selective estrogen receptor modulators (SERMs): Secondary | ICD-10-CM | POA: Diagnosis not present

## 2020-07-14 HISTORY — PX: BIOPSY: SHX5522

## 2020-07-14 HISTORY — PX: HEMOSTASIS CLIP PLACEMENT: SHX6857

## 2020-07-14 HISTORY — PX: POLYPECTOMY: SHX5525

## 2020-07-14 HISTORY — PX: COLONOSCOPY WITH PROPOFOL: SHX5780

## 2020-07-14 SURGERY — COLONOSCOPY WITH PROPOFOL
Anesthesia: General

## 2020-07-14 MED ORDER — LACTATED RINGERS IV SOLN
INTRAVENOUS | Status: DC
  Administered 2020-07-14: 1000 mL via INTRAVENOUS

## 2020-07-14 MED ORDER — PROPOFOL 10 MG/ML IV BOLUS
INTRAVENOUS | Status: DC | PRN
  Administered 2020-07-14: 30 mg via INTRAVENOUS

## 2020-07-14 MED ORDER — SODIUM CHLORIDE 0.9 % IV SOLN: INTRAVENOUS | Status: DC

## 2020-07-14 MED ORDER — PHENYLEPHRINE 40 MCG/ML (10ML) SYRINGE FOR IV PUSH (FOR BLOOD PRESSURE SUPPORT)
PREFILLED_SYRINGE | INTRAVENOUS | Status: DC | PRN
  Administered 2020-07-14 (×3): 80 ug via INTRAVENOUS

## 2020-07-14 MED ORDER — PROPOFOL 500 MG/50ML IV EMUL
INTRAVENOUS | Status: DC | PRN
  Administered 2020-07-14: 100 ug/kg/min via INTRAVENOUS

## 2020-07-14 SURGICAL SUPPLY — 22 items

## 2020-07-14 NOTE — Anesthesia Preprocedure Evaluation (Addendum)
Anesthesia Evaluation  Patient identified by MRN, date of birth, ID band Patient awake    Reviewed: Allergy & Precautions, NPO status , Patient's Chart, lab work & pertinent test results, reviewed documented beta blocker date and time   Airway Mallampati: II  TM Distance: >3 FB Neck ROM: Full    Dental no notable dental hx. (+) Dental Advisory Given   Pulmonary former smoker,    Pulmonary exam normal breath sounds clear to auscultation       Cardiovascular hypertension, Pt. on medications and Pt. on home beta blockers + CAD and + CABG  Normal cardiovascular exam+ dysrhythmias Atrial Fibrillation + Valvular Problems/Murmurs  Rhythm:Irregular Rate:Tachycardia  ECG: A-flutter, rate 87  ECHO: LV EF: 60% -   65% S/P mitral valve annuloplasty with 28 mm Memo 4D ring. Normal LV EF, trivial MR. Mild-moderate TR with RV-RA gradient of 37 mmHg. Trivial posterior pericardial effusion.   Neuro/Psych negative neurological ROS  negative psych ROS   GI/Hepatic Neg liver ROS, Ulcerative colitis    Endo/Other  negative endocrine ROS  Renal/GU negative Renal ROS  negative genitourinary   Musculoskeletal Gout   Abdominal Normal abdominal exam  (+)   Peds  Hematology  (+) anemia , INR: 3.1   Anesthesia Other Findings   Patient Name:  Michele Green Date of Exam: 09/05/2019  Medical Rec #: [id number]    Height:    64.0 in  Accession #:  [id number]    Weight:    127.0 lb  Date of Birth: June 17, 1941    BSA:     1.61 m  Patient Age:  79 years     BP:      104/69 mmHg  Patient Gender: F        HR:      117 bpm.  Exam Location: [place]   Procedure: 2D Echo, Cardiac Doppler and Color Doppler   Indications:  I05.9 Mitral valve disorder; Z98.890 S/P mitral valve  repair;    History:    Patient has prior history of Echocardiogram examinations,  most          recent 10/03/2018. CAD, Prior CABG; Arrythmias:Atrial  Flutter         and Atrial Fibrillation Risk Factors:Hypertension and         Dyslipidemia. Mitral Valve: A 28 mm Sorin Memo 4D ring         transannular ring valve is present in the mitral position.  MAZE         procedure. History of left breast cancer.    Sonographer:  Diamond Nickel RCS  Referring Phys: [place]    1. Left ventricular ejection fraction, by visual estimation, is 60 to  65%. The left ventricle has normal function. There is moderately increased  left ventricular hypertrophy.  2. Global right ventricle has moderately reduced systolic function.The  right ventricular size is normal. No increase in right ventricular wall  thickness.  3. Left atrial size was severely dilated.  4. Right atrial size was normal.  5. The mitral valve has been repaired/replaced. Mild mitral valve  regurgitation. Mild-moderate mitral stenosis.  6. MV peak gradient, 19.4 mmHg. HR 117 during this study.  7. The tricuspid valve is grossly normal. Tricuspid valve regurgitation  is mild.  8. The aortic valve is tricuspid Aortic valve regurgitation was not  visualized by color flow Doppler.  9. The pulmonic valve was grossly normal. Pulmonic valve regurgitation is  trivial by color  flow Doppler.  10. Moderately elevated pulmonary artery systolic pressure.  11. The inferior vena cava is dilated in size with <50% respiratory  variability, suggesting right atrial pressure of 15 mmHg.   In comparison to the previous echocardiogram(s): 09/2018: LVEF 60-65%,  mean MV gradient 5 mmHg.  FINDINGS  Left Ventricle: Left ventricular ejection fraction, by visual estimation,  is 60 to 65%. The left ventricle has normal function. There is moderately  increased left ventricular hypertrophy.   Right Ventricle: The right ventricular size is normal. No increase in  right  ventricular wall thickness. Global RV systolic function is has  moderately reduced systolic function. The tricuspid regurgitant velocity  is 3.21 m/s, and with an assumed right  atrial pressure of 15 mmHg, the estimated right ventricular systolic  pressure is moderately elevated at 56.2 mmHg.   Left Atrium: Left atrial size was severely dilated.   Right Atrium: Right atrial size was normal in size   Pericardium: There is no evidence of pericardial effusion.   Mitral Valve: The mitral valve has been repaired/replaced. Mild-moderate  mitral valve stenosis by observation. MV peak gradient, 19.4 mmHg. Mild  mitral valve regurgitation.   Tricuspid Valve: The tricuspid valve is grossly normal. Tricuspid valve  regurgitation is mild by color flow Doppler.   Aortic Valve: The aortic valve is tricuspid. Aortic valve regurgitation  was not visualized by color flow Doppler.   Pulmonic Valve: The pulmonic valve was grossly normal. Pulmonic valve  regurgitation is trivial by color flow Doppler.   Aorta: The aortic root and ascending aorta are structurally normal, with  no evidence of dilitation.   Venous: The inferior vena cava is dilated in size with less than 50%  respiratory variability, suggesting right atrial pressure of 15 mmHg.   IAS/Shunts: No atrial level shunt detected by color flow Doppler.      LEFT VENTRICLE  PLAX 2D  LVIDd:     3.90 cm  LVIDs:     2.41 cm  LV PW:     0.89 cm  LV IVS:    1.43 cm  LVOT diam:   2.00 cm  LV SV:     46 ml  LV SV Index:  28.19  LVOT Area:   3.14 cm     RIGHT VENTRICLE  RV Basal diam: 2.75 cm  TAPSE (M-mode): 0.4 cm  RVSP:      49.2 mmHg   LEFT ATRIUM       Index    RIGHT ATRIUM      Index  LA diam:    4.80 cm 2.98 cm/m RA Pressure: 8.00 mmHg  LA Vol (A2C):  118.0 ml 73.16 ml/m RA Area:   16.10 cm  LA Vol (A4C):  82.8 ml 51.33 ml/m RA Volume:  38.80 ml 24.05 ml/m  LA  Biplane Vol: 99.8 ml 61.87 ml/m  AORTIC VALVE  LVOT Vmax:  81.43 cm/s  LVOT Vmean: 52.967 cm/s  LVOT VTI:  0.158 m    AORTA  Ao Root diam: 3.00 cm   MITRAL VALVE       TRICUSPID VALVE  MV Area (PHT): 3.33 cm  TR Peak grad:  41.2 mmHg  MV Peak grad: 19.4 mmHg TR Vmax:    337.00 cm/s  MV Mean grad: 9.0 mmHg  Estimated RAP: 8.00 mmHg  MV Vmax:    2.20 m/s  RVSP:      49.2 mmHg  MV Vmean:   129.0 cm/s  MV VTI:    0.36 m  SHUNTS  MV PHT:    66.00 msec Systemic VTI: 0.16 m  MR Peak grad: 83.5 mmHg  Systemic Diam: 2.00 cm  MR Mean grad: 54.0 mmHg  MR Vmax:   457.00 cm/s  MR Vmean:   347.0 cm/s     Lyman Bishop MD  Electronically signed by Lyman Bishop MD  Signature Date/Time: 09/05/2019/4:40:11 PM      Final  Imaging Info  ECHOCARDIOGRAM COMPLETE (Order #[id number]) on 09/05/19 Study History  ECHOCARDIOGRAM COMPLETE (Order #[id number]) on 09/05/19 [employer] Images  Show images for ECHOCARDIOGRAM COMPLETE Images on [employer]  Show images for August Saucer D "PAT" Performing Technologist/Nurse  Performing Technologist/Nurse: Valinda Hoar, RCS Reason for Exam Priority: STAT Dx: S/P mitral valve repair + CABG x1 + maze procedure (Z98.890 (ICD-10-CM)) Surgical History  Surgical History   Procedure Laterality Date Comment Source CARDIAC CATHETERIZATION     CORONARY ARTERY BYPASS GRAFT N/A 08/10/2018 Procedure: CORONARY ARTERY BYPASS GRAFTING (CABG) x 1, LIMA-LAD, USING LEFT INTERNAL MAMMARY ARTERY. HARVESTED RIGHT GREATER SAPHENOUS VEIN ENDOSCOPICALLY; Surgeon: Rexene Alberts, MD; Location: [place]; Service: Open Heart Surgery; Laterality: N/A;   Other Surgical History   Procedure Laterality Date Comment Source BREAST BIOPSY Left 01/28/2016   BREAST LUMPECTOMY Left 02/23/2016   BREAST LUMPECTOMY WITH RADIOACTIVE SEED LOCALIZATION Left 02/23/2016 Procedure: BREAST LUMPECTOMY WITH RADIOACTIVE  SEED LOCALIZATION; Surgeon: [name], MD; Location: [place]; Service: General; Laterality: Left;  CARDIOVERSION N/A 10/02/2015 Procedure: CARDIOVERSION; Surgeon: Jerline Pain, MD; Location: [facility] ENDOSCOPY; Service: Cardiovascular; Laterality: N/A;  CARDIOVERSION N/A 10/05/2018 Procedure: CARDIOVERSION; Surgeon: Fay Records, MD; Location: [facility] ENDOSCOPY; Service: Cardiovascular; Laterality: N/A;  CARDIOVERSION N/A 11/05/2019 Procedure: CARDIOVERSION; Surgeon: Dorothy Spark, MD; Location: [place]; Service: Cardiovascular; Laterality: N/A;  CLIPPING OF ATRIAL APPENDAGE  08/10/2018 Procedure: CLIPPING OF ATRIAL APPENDAGE; Surgeon: Rexene Alberts, MD; Location: [place]; Service: Open Heart Surgery;;  COLONOSCOPY     CYSTOSCOPY W/ RETROGRADES Bilateral 06/20/2019 Procedure: CYSTOSCOPY WITH RETROGRADE PYELOGRAM; Surgeon: Ardis Hughs, MD; Location: [facility]; Service: Urology; Laterality: Bilateral;  CYSTOSCOPY WITH HYDRODISTENSION AND BIOPSY N/A 06/20/2019 Procedure: CYSTOSCOPY BLADDER BIOPSY WITH FULGERATION; Surgeon: Ardis Hughs, MD; Location: [facility]; Service: Urology; Laterality: N/A;  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  DUPUYTREN CONTRACTURE RELEASE Right 05/02/2014 Procedure: EXCISION DUPUYTRENS RIGHT PALMAR/SMALL ; Surgeon: Cammie Sickle, MD; Location: [place]; Service: Orthopedics; Laterality: Right;  MAZE N/A 08/10/2018 Procedure: MAZE; Surgeon: Rexene Alberts, MD; Location: [place]; Service: Open Heart Surgery; Laterality: N/A;  MITRAL VALVE REPAIR N/A 08/10/2018 Procedure: MITRAL VALVE REPAIR (MVR); Surgeon: Rexene Alberts, MD; Location: [place]; Service: Open Heart Surgery; Laterality: N/A; glutaraldehyde  RIGHT/LEFT HEART CATH AND CORONARY ANGIOGRAPHY N/A 06/20/2018 Procedure: RIGHT/LEFT HEART CATH AND  CORONARY ANGIOGRAPHY; Surgeon: [name], MD; Location: [place] [facility]; Service: Cardiovascular; Laterality: N/A;  TEE WITHOUT CARDIOVERSION N/A 06/20/2018 Procedure: TRANSESOPHAGEAL ECHOCARDIOGRAM (TEE); Surgeon: Pixie Casino, MD; Location: [facility] ENDOSCOPY; Service: Cardiovascular; Laterality: N/A;  TEE WITHOUT CARDIOVERSION N/A 08/10/2018 Procedure: TRANSESOPHAGEAL ECHOCARDIOGRAM (TEE); Surgeon: Rexene Alberts, MD; Location: [place]; Service: Open Heart Surgery; Laterality: N/A;  TONSILLECTOMY  age 57    Implants   Prosthesis/Implant Heart Ck Quick Load 6 Pouch - [id number] - Implanted Heart  Inventory item: CK QUICK LOAD 6 POUCH Model/Cat number: [id number] Manufacturer: [employer] Lot number: [id number] Area Of Implantation: Heart   As of 08/10/2018  Status: Implanted  Cor-Knot Mini Combo Kit - [id number] - Implanted Heart  Inventory item: COR-KNOT MINI COMBO KIT Model/Cat number: [id number] Manufacturer: [employer] Lot number: [id number] Area Of Implantation: Heart   As of 08/10/2018  Status: Implanted    Ring Mitral Memo 4d 28 - [id number] - Implanted Heart  Inventory item: RING MITRAL MEMO 4D 28 Model/Cat number: [id number] Serial number: [id number] Manufacturer: [employer] Area Of Implantation: Heart   As of 08/10/2018  Status: Implanted    Ck Quick Load 6 Pouch - [id number] - Implanted Heart  Inventory item: CK QUICK LOAD 6 POUCH Model/Cat number: [id number] Manufacturer: [employer] Lot number: [id number] Area Of Implantation: Heart   As of 08/10/2018  Status: Implanted    Implant 8 Screws, Rods, And Crosslink - Discarded As of 08/23/2017  Status: Discarded    Encounter-Level Documents - 09/05/2019:  Electronic signature on 09/05/2019 2:04 PM - E-signed Electronic signature on 09/05/2019 2:04 PM - Princess Perna   Order-Level Documents - 09/05/2019:  Scan on 09/05/2019 4:41 PM by Default, Provider, MD   Resulted  by:  Signed Date/Time  Phone Pager Pixie Casino 09/05/2019 4:40 PM [phone]  External Result Report  External Result Report ECHOCARDIOGRAM COMPLETE: Patient Communication  Not Released Not seen    Reproductive/Obstetrics                            Anesthesia Physical  Anesthesia Plan  ASA: II  Anesthesia Plan: General   Post-op Pain Management:    Induction:   PONV Risk Score and Plan: Propofol infusion and Treatment may vary due to age or medical condition  Airway Management Planned: Mask, Nasal Cannula, Natural Airway and Simple Face Mask  Additional Equipment: None  Intra-op Plan:   Post-operative Plan:   Informed Consent: I have reviewed the patients History and Physical, chart, labs and discussed the procedure including the risks, benefits and alternatives for the proposed anesthesia with the patient or authorized representative who has indicated his/her understanding and acceptance.       Plan Discussed with:   Anesthesia Plan Comments:         Anesthesia Quick Evaluation

## 2020-07-14 NOTE — Interval H&P Note (Signed)
History and Physical Interval Note: 79/female with history of UC , history of C diff colitis, with persistent anemia,last colonoscopy in 2013, last dose of xarelto 3 days ago.  07/14/2020 1:39 PM  Michele Green  has presented today for surgery, with the diagnosis of Ulcerative colitis with rectal bleeding.  The various methods of treatment have been discussed with the patient and family. After consideration of risks, benefits and other options for treatment, the patient has consented to  Procedure(s): COLONOSCOPY WITH PROPOFOL (N/A) as a surgical intervention.  The patient's history has been reviewed, patient examined, no change in status, stable for surgery.  I have reviewed the patient's chart and labs.  Questions were answered to the patient's satisfaction.     Ronnette Juniper

## 2020-07-14 NOTE — Op Note (Signed)
[facility] Patient Name: Michele Green Procedure Date: 07/14/2020 MRN: [id number] Attending MD: Ronnette Juniper , MD Date of Birth: 1941-06-30 CSN: [id number] Age: 79 Admit Type: Outpatient Procedure:                Colonoscopy Indications:              Last colonoscopy: 2013, Diarrhea, Rectal bleeding Providers:                Ronnette Juniper, MD, Clyde Lundborg, RN, Tyna Jaksch                            Technician, Stephanie [place] CRNA Referring MD:             Jennye Boroughs Medicines:                Monitored Anesthesia Care Complications:            No immediate complications. Estimated blood loss:                            Minimal. Estimated Blood Loss:     Estimated blood loss was minimal. Procedure:                Pre-Anesthesia Assessment:                           - Prior to the procedure, a History and Physical                            was performed, and patient medications and                            allergies were reviewed. The patient's tolerance of                            previous anesthesia was also reviewed. The risks                            and benefits of the procedure and the sedation                            options and risks were discussed with the patient.                            All questions were answered, and informed consent                            was obtained. Prior Anticoagulants: The patient has                            taken Xarelto (rivaroxaban), last dose was 2 days                            prior to procedure. ASA Grade Assessment: III - A  patient with severe systemic disease. After                            reviewing the risks and benefits, the patient was                            deemed in satisfactory condition to undergo the                            procedure.                           After obtaining informed consent, the colonoscope                            was passed  under direct vision. Throughout the                            procedure, the patient's blood pressure, pulse, and                            oxygen saturations were monitored continuously. The                            [id number]-[id number] ([id number]) Olympus pediatric colonscope                            was introduced through the anus and advanced to the                            the cecum, identified by appendiceal orifice and                            ileocecal valve. The colonoscopy was performed                            without difficulty. The patient tolerated the                            procedure well. The quality of the bowel                            preparation was adequate to identify polyps 6 mm                            and larger in size. Scope In: 1:48:02 PM Scope Out: 2:17:46 PM Scope Withdrawal Time: 0 hours 26 minutes 28 seconds  Total Procedure Duration: 0 hours 29 minutes 44 seconds  Findings:      The perianal and digital rectal examinations were normal.      Normal mucosa was found in the cecum. Biopsies were taken with a cold       forceps for histology.      Normal mucosa was found in the ascending colon. Biopsies were taken with       a cold forceps  for histology.      A localized area of mildly congested, erythematous and ulcerated mucosa       was found at the hepatic flexure. Biopsies were taken with a cold       forceps for histology.      A patchy area of mildly congested, erythematous, nodular and thickened       folds of the mucosa was found in the transverse colon. Biopsies were       taken with a cold forceps for histology.      Four sessile polyps were found in the transverse colon. The polyps were       4 to 9 mm in size. These polyps were removed with a hot snare and cold       biopsy forceps. Resection and retrieval were complete. To stop active       bleeding, one hemostatic clip was successfully placed (MR conditional).       There was no  bleeding at the end of the procedure.      A diffuse area of moderately altered vascular, congested, erythematous,       friable (with contact bleeding), nodular, pseudopolypoid and ulcerated       mucosa was found in the descending colon. Biopsies were taken with a       cold forceps for histology.      A diffuse area of severely congested, erythematous, friable (with       contact bleeding), inflamed, nodular, pseudopolypoid and ulcerated       mucosa was found in the sigmoid colon. Biopsies were taken with a cold       forceps for histology.      A patchy area of mildly congested, erythematous, inflamed and nodular       mucosa was found in the rectum. Biopsies were taken with a cold forceps       for histology.      A few small and large-mouthed diverticula were found in the transverse       colon, hepatic flexure and ascending colon.      Anal papilla(e) were hypertrophied.      Non-bleeding internal hemorrhoids were found during retroflexion. The       hemorrhoids were small. Impression:               - Normal mucosa in the cecum. Biopsied.                           - Normal mucosa in the ascending colon. Biopsied.                           - Congested, erythematous and ulcerated mucosa at                            the hepatic flexure. Biopsied.                           - Congested, erythematous, nodular and thickened                            folds of the mucosa in the transverse colon.  Biopsied.                           - Four 4 to 9 mm polyps in the transverse colon,                            removed with a hot snare. Resected and retrieved.                            Clip (MR conditional) was placed.                           - Altered vascular, congested, erythematous,                            friable (with contact bleeding), nodular,                            pseudopolypoid and ulcerated mucosa in the                            descending  colon. Biopsied.                           - Congested, erythematous, friable (with contact                            bleeding), inflamed, nodular, pseudopolypoid and                            ulcerated mucosa in the sigmoid colon. Biopsied.                           - Congested, erythematous, inflamed and nodular                            mucosa in the rectum. Biopsied.                           - Diverticulosis in the transverse colon, at the                            hepatic flexure and in the ascending colon.                           - Anal papilla(e) were hypertrophied.                           - Non-bleeding internal hemorrhoids. Moderate Sedation:      Patient did not receive moderate sedation for this procedure, but       instead received monitored anesthesia care. Recommendation:           - Patient has a contact number available for                            emergencies. The signs  and symptoms of potential                            delayed complications were discussed with the                            patient. Return to normal activities tomorrow.                            Written discharge instructions were provided to the                            patient.                           - Resume regular diet.                           - Continue present medications.                           - Await pathology results.                           - Repeat colonoscopy for surveillance based on                            pathology results.                           - Resume Xarelto (rivaroxaban) at prior dose                            tomorrow.                           - Will likely need to start biologics or                            immunomodulators for esclation of therapy of UC. Procedure Code(s):        --- Professional ---                           [phone], Colonoscopy, flexible; with removal of                            tumor(s), polyp(s), or other lesion(s) by  snare                            technique                           [id number], 37, Colonoscopy, flexible; with biopsy,                            single or multiple Diagnosis Code(s):        --- Professional ---  K64.8, Other hemorrhoids                           K92.2, Gastrointestinal hemorrhage, unspecified                           K52.9, Noninfective gastroenteritis and colitis,                            unspecified                           K63.89, Other specified diseases of intestine                           K63.3, Ulcer of intestine                           K62.89, Other specified diseases of anus and rectum                           K63.5, Polyp of colon                           R19.7, Diarrhea, unspecified                           K62.5, Hemorrhage of anus and rectum                           K57.30, Diverticulosis of large intestine without                            perforation or abscess without bleeding CPT copyright 2019 American Medical Association. All rights reserved. The codes documented in this report are preliminary and upon coder review may  be revised to meet current compliance requirements. Ronnette Juniper, MD 07/14/2020 2:28:09 PM This report has been signed electronically. Number of Addenda: 0

## 2020-07-14 NOTE — Brief Op Note (Signed)
07/14/2020  2:28 PM  PATIENT:  Michele Green  79 y.o. female  PRE-OPERATIVE DIAGNOSIS:  Ulcerative colitis with rectal bleeding  POST-OPERATIVE DIAGNOSIS:  cecum, ascending colon, hepatic flexure, transverse colon, descending colon, and rectal biopsies for ulcerative colitis, transverse colon polyps, transverse colon clip  PROCEDURE:  Procedure(s): COLONOSCOPY WITH PROPOFOL (N/A) BIOPSY HEMOSTASIS CLIP PLACEMENT POLYPECTOMY  SURGEON:  Surgeon(s) and Role:    Ronnette Juniper, MD - Primary  PHYSICIAN ASSISTANT:   ASSISTANTS:Stacy Rosana Berger, RN, Tyna Jaksch, Tech  ANESTHESIA:   MAC  EBL:  5 mL   BLOOD ADMINISTERED:none  DRAINS: none   LOCAL MEDICATIONS USED:  NONE  SPECIMEN:  Biopsy / Limited Resection  DISPOSITION OF SPECIMEN:  PATHOLOGY  COUNTS:  YES  TOURNIQUET:  * No tourniquets in log *  DICTATION: .Dragon Dictation  PLAN OF CARE: Discharge to home after PACU  PATIENT DISPOSITION:  PACU - hemodynamically stable.   Delay start of Pharmacological VTE agent (>24hrs) due to surgical blood loss or risk of bleeding: yes

## 2020-07-14 NOTE — Anesthesia Postprocedure Evaluation (Signed)
Anesthesia Post Note  Patient: Michele Green  Procedure(s) Performed: COLONOSCOPY WITH PROPOFOL (N/A ) BIOPSY HEMOSTASIS CLIP PLACEMENT POLYPECTOMY     Patient location during evaluation: Endoscopy Anesthesia Type: General Level of consciousness: awake Pain management: pain level controlled Vital Signs Assessment: post-procedure vital signs reviewed and stable Respiratory status: spontaneous breathing Cardiovascular status: blood pressure returned to baseline and stable Postop Assessment: no apparent nausea or vomiting Anesthetic complications: no   No complications documented.  Last Vitals:  Vitals:   07/14/20 1428 07/14/20 1439  BP: (!) 92/49 103/65  Pulse: 97 (!) 101  Resp: (!) 22 (!) 23  Temp: 36.4 C   SpO2: 99% 99%    Last Pain:  Vitals:   07/14/20 1439  TempSrc:   PainSc: 0-No pain   Pain Goal:                   Huston Foley

## 2020-07-14 NOTE — H&P (Signed)
History of Present Illness  General:          79 year old female with history of left-sided ulcerative colitis on prednisone 20 milligrams daily, mesalamine 0.375 grams 4 tablets daily and mesalamine suppository., history of C diff infection status post vancomycin treatment(she was given antibiotics prior to dental work a few months ago and has history of recurrent UTIs), acute blood loss anemia, received IV iron infusion X 2.        Labs from 05/28/20 showed hemoglobin 9.3, WBC 12.2, platelet 165        labs from 05/22/20 showed hemoglobin 8.8, WBC 15.9, platelet 178        C diff was negative in 05/06/20        hemoglobin was 9, MCV 104.8 with a platelet of 180 on 05/03/20        CT abdomen from 05/22/20 showed acute segmental colitis in sigmoid, no abscissa perforation with normal remainder of colon and small intestine.        Last colonoscopy was performed in 2013 by Dr. Amedeo Plenty was removal of hyperplastic polyp from transverse and was noted to have moderately active chronic colitis in rectum and sigmoid.        She gets dizzy but it wont last long, she is afraid to drive her car.        Diarrhea has resolved, she has about 3 Bms usually in the morning, stools are dark as she takes oral iron.        There was a time she has up to 4 loose Bms.        She denies nocturnal diarrhea.        She has lost weight, states she has no appetite, has swelling in her both feet.        She has atrial fibrillation and is on xarelto(Dr.Smith is her cardiologist), denies CHF, she had open heart surgery 2-3 years ago.        Denies abdominal or rectal pain, denies nausea or vomiting, acid reflux or heartburn, denies difficulty or pain on swallowing.     Current Medications  Taking  .predniSONE 10 MG Tablet 1 tablet once a day    .Rowasa(Mesalamine) 4 GM Kit as directed Rectal     .Florastor(Saccharomyces boulardii) 250 MG Capsule 1 capsule Orally Twice a day    .Iron (Ferrous Sulfate) 325 (65 Fe) MG Tablet 1  tablet Orally Once a day    .Lasix(Furosemide) 20 MG Tablet 1 tablet Orally as needed    .Metoprolol Tartrate 100 MG Tablet 1 tablet with food Orally Twice a day    .Vancomycin HCl 125 MG Capsule 1 capsule Orally four times a day    .Mesalamine ER 0.375 GM Capsule Extended Release 24 Hour 4 capsules in the morning Orally Once a day    .Vitamin D 50 MCG (2000 UT) Tablet 2 tablets Orally Once a day    .Allopurinol 100 MG Tablet 2 tablet Orally once a day    .Potassium Chloride 20 MEQ Tablet Extended Release 1 tablet with food Orally Once a day    .Xarelto(Rivaroxaban) 15 MG Tablet 1 tablet with food Orally Once a day    .Phenazopyridine HCl 200 MG Tablet 1 tablet after meals Orally Three times a day    .Simvastatin 20 MG Tablet 1 tablet every evening Orally Once a day    .Tamoxifen Citrate 10 MG Tablet 1 tablet (55m) Orally Once a day    .Prolia(Denosumab) 60 MG/ML Solution 60 mg  Subcutaneous every 6 month    .Vitamin B12 1000 MCG Tablet Extended Release 1 tablet Orally Once a day, Notes: per pt is out, would like re-fill    .Acetaminophen 325 MG Capsule 2 tablet as needed Orally every 6 hrs, Notes: PRN    Medication List reviewed and reconciled with the patient         Past Medical History        Hypertension.         Hyperlipidemia.         Mitral regurgitation - Mitral regurg.         Osteoporosis.         DJD.         [facility] - Dr Therisa Doyne.         Abnormal fasting glucose.         Left breast cancer dx 01/2016.         Afib - failed cardioversion 09/2015 - dr Tamala Julian.         Gout.         [facility] - Dr Louis Meckel.        Surgical History         tonsils and adenoidectomy          colonoscopy-[facility] 3/05         pinky finger released bilaterally multiple times          Colonoscopy 02/2012         left breat lumpectomy 01/2016         back surgery for lumbar compression fracture 06/2016         Mitral valve repair, CABG, [place] - Dr. Roxy Manns  07/2018         benign bladder bx 05/2019         heart shocked back into rhythm 2020       Family History   Father: deceased, diagnosed with Coronary artery disease   Mother: deceased 31 yrs, cancer, breast (in her 50s), NASH, cirrhosis(medations)   Maternal Grand Mother: deceased, diagnosed with Colon cancer   Sister 1: alive 45 yrs, cancer, breast (in her 25s), diagnosed with Breast cancer in 2002 at the age of 67   Sister 2: alive, [place]   2 sister(s) .    Negative family hx of colon polyps and kidney disease.       Social History  General:   Tobacco use      cigarettes: Former smoker    Quit in year 1990's    Tobacco history last updated 06/20/2020    Vaping No no EXPOSURE TO PASSIVE SMOKE.  Alcohol: yes, occasionally, 1-2 per week, wine, liquor.  Caffeine: yes, 2 servings daily.  no Recreational drug use.  no DIET.  Exercise: yes, walks 3x week approx 1 mile (65mn).  Marital Status: married, [place]  Children: 1 son, 2 grandchildren.  OCCUPATION: retired bInsurance claims handler      Allergies   high dose flu shot : SOB (has done well with normal) - Allergy   Augmentin: diarrhea (03/2017) - Side Effects       Hospitalization/Major Diagnostic Procedure   back surgery 06/2016   Back surgery 07/2017   Mitral valve repair, CABG, M[place] - Dr. ORoxy Manns09/2019   not in the past year, no recent ER visits 04/2020   Iron infusion x2 05/2020       Review of Systems  GI PROCEDURE:         no Pacemaker/ AICD, no. no Artificial  heart valves. no MI/heart attack. Abnormal heart rhythm  YES, YES, Atrial Fibrillation. no Angina. no CVA. no Hypertension. no Hypotension. no Asthma, COPD. no Sleep apnea. no Seizure disorders. no Artificial joints. no Severe DJD. no Diabetes. no Significant headaches. Vertigo  YES. Depression/anxiety  YES. no Abnormal bleeding. no Kidney Disease. no Liver disease, no. no Chance of pregnancy. no Blood transfusion. no Method of Birth Control. no  Birth control pills.           Vital Signs  Wt 124.6, Wt change -2.4 lb, Ht 64, BMI 21.39, Temp 97.5, Pulse sitting 83, BP sitting 90/55.     Examination  Gastroenterology::       GENERAL APPEARANCE:  Elderly, uses a walker for ambulation, frail appearing,multiple bruises noted.        CARDIOVASCULAR irregular rhythm, normal rate.        RESPIRATORY Breath sounds normal. Respiration even and unlabored.        ABDOMEN No masses palpated. Liver and spleen not palpated, normal. Bowel sounds normal, Abdomen not distended.        EXTREMITIES:  Bilateral ankle swelling.        NEURO:  Alert, oriented to time, place and person.        PSYCH: mood/affect normal.       Assessments     1. Ulcerative colitis with rectal bleeding, unspecified location - K51.911 (Primary)     Treatment   1. Ulcerative colitis with rectal bleeding, unspecified location        IMAGING: Colonoscopy              Powell,Amy 06/20/2020 12:38:38 PM > Pt scheduled at [facility] 8/23 - gave instructions, rx and consents. Spoke with Myriam Jacobson at [employer]. Faxed request to Dr Tamala Julian for [facility] hold   Notes: Last colonoscopy was in 2013. Will need to get clearance from Dr.Smith to hold xarelto for at least 3 days prior to the procedure. Patient is on Rowasa enema as needed, mesalamine 4 pills a day andprednisone 10 milligrams daily without much improvement in symptoms. Despite being on oral iron and receiving to iron infusions her hemoglobin is still less around 9 and she is on Xarelto for atrial fibrillation. Recommend to proceed with a colonoscopy. Patient wants a low volume prep. Will plan on prescribing Suprep and scheduling the patient as an outpatient at [facility]

## 2020-07-14 NOTE — Discharge Instructions (Signed)
YOU HAD AN ENDOSCOPIC PROCEDURE TODAY: Refer to the procedure report and other information in the discharge instructions given to you for any specific questions about what was found during the examination. If this information does not answer your questions, please call [facility] office at [phone] to clarify.   YOU SHOULD EXPECT: Some feelings of bloating in the abdomen. Passage of more gas than usual. Walking can help get rid of the air that was put into your GI tract during the procedure and reduce the bloating. If you had a lower endoscopy (such as a colonoscopy or flexible sigmoidoscopy) you may notice spotting of blood in your stool or on the toilet paper. Some abdominal soreness may be present for a day or two, also.  DIET: Your first meal following the procedure should be a light meal and then it is ok to progress to your normal diet. A half-sandwich or bowl of soup is an example of a good first meal. Heavy or fried foods are harder to digest and may make you feel nauseous or bloated. Drink plenty of fluids but you should avoid alcoholic beverages for 24 hours. If you had a esophageal dilation, please see attached instructions for diet.    ACTIVITY: Your care partner should take you home directly after the procedure. You should plan to take it easy, moving slowly for the rest of the day. You can resume normal activity the day after the procedure however YOU SHOULD NOT DRIVE, use power tools, machinery or perform tasks that involve climbing or major physical exertion for 24 hours (because of the sedation medicines used during the test).   SYMPTOMS TO REPORT IMMEDIATELY: A gastroenterologist can be reached at any hour. Please call [phone]  for any of the following symptoms:  . Following lower endoscopy (colonoscopy, flexible sigmoidoscopy) Excessive amounts of blood in the stool  Significant tenderness, worsening of abdominal pains  Swelling of the abdomen that is new, acute  Fever of 100  or higher   FOLLOW UP:  If any biopsies were taken you will be contacted by phone or by letter within the next 1-3 weeks. Call [phone]  if you have not heard about the biopsies in 3 weeks.  Please also call with any specific questions about appointments or follow up tests.

## 2020-07-14 NOTE — Transfer of Care (Signed)
Immediate Anesthesia Transfer of Care Note  Patient: Michele Green  Procedure(s) Performed: COLONOSCOPY WITH PROPOFOL (N/A ) BIOPSY HEMOSTASIS CLIP PLACEMENT POLYPECTOMY  Patient Location: PACU and Endoscopy Unit  Anesthesia Type:MAC  Level of Consciousness: awake, alert  and oriented  Airway & Oxygen Therapy: Patient Spontanous Breathing  Post-op Assessment: Report given to RN and Post -op Vital signs reviewed and stable  Post vital signs: Reviewed and stable  Last Vitals:  Vitals Value Taken Time  BP 172/115 07/14/20 1423  Temp    Pulse 57 07/14/20 1424  Resp 22 07/14/20 1424  SpO2 100 % 07/14/20 1424  Vitals shown include unvalidated device data.  Last Pain:  Vitals:   07/14/20 1227  TempSrc: Oral  PainSc: 0-No pain         Complications: No complications documented.

## 2020-07-15 ENCOUNTER — Encounter (HOSPITAL_COMMUNITY): Payer: Self-pay | Admitting: Gastroenterology

## 2020-07-15 LAB — SURGICAL PATHOLOGY

## 2020-07-22 ENCOUNTER — Other Ambulatory Visit: Payer: Self-pay

## 2020-07-22 ENCOUNTER — Ambulatory Visit
Admission: RE | Admit: 2020-07-22 | Discharge: 2020-07-22 | Disposition: A | Discharge status: Home / Self Care | Payer: Medicare Other | Source: Ambulatory Visit | Attending: Gastroenterology | Admitting: Gastroenterology

## 2020-07-22 ENCOUNTER — Other Ambulatory Visit: Payer: Self-pay | Admitting: Gastroenterology

## 2020-07-22 DIAGNOSIS — K51919 Ulcerative colitis, unspecified with unspecified complications: Secondary | ICD-10-CM

## 2020-07-25 ENCOUNTER — Encounter (HOSPITAL_COMMUNITY): Payer: Medicare Other

## 2020-07-29 ENCOUNTER — Other Ambulatory Visit: Payer: Self-pay

## 2020-07-29 MED ORDER — AMOXICILLIN 500 MG PO TABS
2000.0000 mg | ORAL_TABLET | ORAL | 1 refills | Status: DC | PRN
Start: 2020-07-29 — End: 2020-08-17

## 2020-07-29 NOTE — Telephone Encounter (Signed)
Ok to fill 

## 2020-07-29 NOTE — Telephone Encounter (Signed)
Pt's medication was sent to pt's pharmacy as requested. Confirmation received.  °

## 2020-07-29 NOTE — Telephone Encounter (Signed)
Pt calling requesting a refill on amoxicillin for a dental procedure. Would [name] like to refill this medication? Please address

## 2020-08-13 NOTE — Progress Notes (Signed)
Cardiology Office Note:    Date:  08/14/2020   ID:  Michele Green, DOB 04-07-1941, MRN [id number]  PCP:  [name], MD  Cardiologist:  Sinclair Grooms, MD   Referring MD: [name], MD   Chief Complaint  Patient presents with  . Atrial Fibrillation  . Coronary Artery Disease  . Advice Only    Dizziness and ankle swelling    History of Present Illness:    Michele Green is a 79 y.o. female with a hx of CAD s/p CABGxi [place]--> LAD andsevereMR s/p repairSeptember 2019, breast cancer, HTN, history of atrial fibrillation prior to open heart surgery, Maze procedure with out atrial appendage ligation September 2019, recurrent A. fib and a flutter post surgery despite amiodarone therapy and cardioversion November 2019.  Decision made by Dr. Curt Bears in March 2021 to pursue rate control rather than any further attempts at rhythm control.  Recent note by Dr. Curt Bears 05/13/2020: "  Atypical atrial flutter: Left atrium is severely dilated.  Status post cardioversion 11/05/2019.  Unfortunately she has gone back into atrial flutter and is now on a rate control strategy.  She is in atrial flutter today which I fear has become somewhat permanent as it is quite atypical.  She has not an ablation candidate.  She has failed amiodarone.  Will increase diltiazem to 240 mg to better control her rates."  She is having exertional fatigue, frequent dizziness, and her legs feel weak and wobbly when she walks.  She also notices pedal edema.  She is on a steroid Dosepak.  She only has 2 prednisone 10 mg tablets remaining.  Past Medical History:  Diagnosis Date  . Arrhythmia   . Atypical atrial flutter ([place]) 08/14/2018   Post-operative  . Breast cancer of upper-outer quadrant of left female breast ([place]) 02/02/2016  . CAD (coronary artery disease) 06/20/2018   LHC 7/19: pLAD 65/90, oD1 90, mLCx 85, OM2 50, oRCA 30, EF 50-55 >> s/p CABG in 9/19 (L-LAD)  . Colon polyp 02/2012  . Dupuytren  contracture    bilateral hands  . Family history of breast cancer   . Hyperlipidemia   . Hypertension   . Mitral regurgitation 11/07/2013   Echo 7/19: Mild LVH, EF 60-65, no RWMA, mod ot severe MR, massive LAE, PASP 33 // TEE 7/19:  Mild conc LVH, EF 60-65, no RWMA, severe MR with mild post leaflet prolapse, massive // s/p MV repair 07/2018  . On continuous oral anticoagulation 08/22/2015   Started on Xarelto 08/12/2015   . Osteopenia   . Persistent atrial fibrillation ([place]) 08/22/2015   Started late August or early September 2016 // s/p Maze procedure 07/2018  . Personal history of radiation therapy   . Radiation Therapy 04/21/16-05/19/16   left breast 47.72 Gy, boosted to 10 Gy  . 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 S/P Maze operation for atrial fibrillation 08/10/2018   Complete bilateral atrial lesion set using bipolar radiofrequency and cryothermy ablation with clipping of LA appendage  . S/P MVR (mitral valve repair) 08/10/2018   Complex valvuloplasty including Gore-tex neochord placement x8, Plication of Lateral Commissure and 59m Sorin Memo 4D Ring Annuloplasty SN# [id number] . [place]   . Wears glasses     Past Surgical History:  Procedure Laterality Date  . BIOPSY  07/14/2020   Procedure: BIOPSY;  Surgeon: KRonnette Juniper MD;  Location: [facility];  Service: Gastroenterology;;  . BREAST BIOPSY Left 01/28/2016  .  BREAST LUMPECTOMY Left 02/23/2016  . BREAST LUMPECTOMY WITH RADIOACTIVE SEED LOCALIZATION Left 02/23/2016   Procedure: BREAST LUMPECTOMY WITH RADIOACTIVE SEED LOCALIZATION;  Surgeon: [name], MD;  Location: [place];  Service: General;  Laterality: Left;  . CARDIAC CATHETERIZATION    . CARDIOVERSION N/A 10/02/2015   Procedure: CARDIOVERSION;  Surgeon: Jerline Pain, MD;  Location: [facility] ENDOSCOPY;  Service: Cardiovascular;  Laterality: N/A;  . CARDIOVERSION N/A 10/05/2018   Procedure: CARDIOVERSION;  Surgeon: Fay Records, MD;   Location: [facility] ENDOSCOPY;  Service: Cardiovascular;  Laterality: N/A;  . CARDIOVERSION N/A 11/05/2019   Procedure: CARDIOVERSION;  Surgeon: Dorothy Spark, MD;  Location: [place];  Service: Cardiovascular;  Laterality: N/A;  . CLIPPING OF ATRIAL APPENDAGE  08/10/2018   Procedure: CLIPPING OF ATRIAL APPENDAGE;  Surgeon: Rexene Alberts, MD;  Location: [place];  Service: Open Heart Surgery;;  . COLONOSCOPY    . COLONOSCOPY WITH PROPOFOL N/A 07/14/2020   Procedure: COLONOSCOPY WITH PROPOFOL;  Surgeon: Ronnette Juniper, MD;  Location: [facility];  Service: Gastroenterology;  Laterality: N/A;  . CORONARY ARTERY BYPASS GRAFT N/A 08/10/2018   Procedure: CORONARY ARTERY BYPASS GRAFTING (CABG) x 1, LIMA-LAD,  USING LEFT INTERNAL MAMMARY ARTERY. HARVESTED RIGHT GREATER SAPHENOUS VEIN ENDOSCOPICALLY;  Surgeon: Rexene Alberts, MD;  Location: [place];  Service: Open Heart Surgery;  Laterality: N/A;  . CYSTOSCOPY W/ RETROGRADES Bilateral 06/20/2019   Procedure: CYSTOSCOPY WITH RETROGRADE PYELOGRAM;  Surgeon: Ardis Hughs, MD;  Location: [facility];  Service: Urology;  Laterality: Bilateral;  . CYSTOSCOPY WITH HYDRODISTENSION AND BIOPSY N/A 06/20/2019   Procedure: CYSTOSCOPY BLADDER  BIOPSY WITH FULGERATION;  Surgeon: Ardis Hughs, MD;  Location: [facility];  Service: Urology;  Laterality: N/A;  . DUPUYTREN CONTRACTURE RELEASE  2001   leftx2  . [place]   right  . DUPUYTREN CONTRACTURE RELEASE Right 05/02/2014   Procedure: EXCISION DUPUYTRENS RIGHT PALMAR/SMALL ;  Surgeon: Cammie Sickle, MD;  Location: [place];  Service: Orthopedics;  Laterality: Right;  . HEMOSTASIS CLIP PLACEMENT  07/14/2020   Procedure: HEMOSTASIS CLIP PLACEMENT;  Surgeon: Ronnette Juniper, MD;  Location: [facility];  Service: Gastroenterology;;  . MAZE N/A 08/10/2018   Procedure: MAZE;  Surgeon: Rexene Alberts, MD;  Location: [place];  Service: Open  Heart Surgery;  Laterality: N/A;  . MITRAL VALVE REPAIR N/A 08/10/2018   Procedure: MITRAL VALVE REPAIR (MVR);  Surgeon: Rexene Alberts, MD;  Location: [place];  Service: Open Heart Surgery;  Laterality: N/A;  glutaraldehyde  . POLYPECTOMY  07/14/2020   Procedure: POLYPECTOMY;  Surgeon: Ronnette Juniper, MD;  Location: [facility];  Service: Gastroenterology;;  . RIGHT/LEFT HEART CATH AND CORONARY ANGIOGRAPHY N/A 06/20/2018   Procedure: RIGHT/LEFT HEART CATH AND CORONARY ANGIOGRAPHY;  Surgeon: [name], MD;  Location: [place] [facility];  Service: Cardiovascular;  Laterality: N/A;  . TEE WITHOUT CARDIOVERSION N/A 06/20/2018   Procedure: TRANSESOPHAGEAL ECHOCARDIOGRAM (TEE);  Surgeon: Pixie Casino, MD;  Location: [facility] ENDOSCOPY;  Service: Cardiovascular;  Laterality: N/A;  . TEE WITHOUT CARDIOVERSION N/A 08/10/2018   Procedure: TRANSESOPHAGEAL ECHOCARDIOGRAM (TEE);  Surgeon: Rexene Alberts, MD;  Location: [place];  Service: Open Heart Surgery;  Laterality: N/A;  . TONSILLECTOMY  age 36    Current Medications: Current Meds  Medication Sig  . acetaminophen (TYLENOL) 500 MG tablet Take 500-1,000 mg by mouth every 6 (six) hours as needed for moderate pain or headache.  [name]  allopurinol (ZYLOPRIM) 100 MG tablet Take 200 mg by mouth every evening.   [name] amoxicillin (AMOXIL) 500 MG tablet Take 4 tablets (2,000 mg total) by mouth as needed (TO BE USED FOR DENTAL PROCEDURE). Take 4 tabs 30-60 minutes before dental cleaning/procedure  . atorvastatin (LIPITOR) 20 MG tablet TAKE 1 TABLET BY MOUTH EVERY DAY  . furosemide (LASIX) 20 MG tablet Take 20 mg by mouth daily as needed (for swelling/fluid retention.).   [name] KLOR-CON M20 20 MEQ tablet TAKE 1 TABLET BY MOUTH EVERY DAY  . mesalamine (APRISO) 0.375 g 24 hr capsule Take 1.5 g by mouth daily.   . metoprolol tartrate (LOPRESSOR) 100 MG tablet TAKE 1 TABLET BY MOUTH TWICE A DAY  . phenazopyridine (PYRIDIUM) 200 MG tablet Take 1 tablet (200 mg total) by mouth 3  (three) times daily as needed for pain.  . predniSONE (STERAPRED UNI-PAK 21 TAB) 10 MG (21) TBPK tablet Take 20 mg by mouth daily. Take as directed - for C-Diff   . tamoxifen (NOLVADEX) 20 MG tablet TAKE 1 TABLET BY MOUTH EVERY DAY  . traMADol (ULTRAM) 50 MG tablet Take 1-2 tablets (50-100 mg total) by mouth every 6 (six) hours as needed for moderate pain.  [name] XARELTO 15 MG TABS tablet TAKE 1 TABLET BY MOUTH  DAILY WITH SUPPER  . [DISCONTINUED] diltiazem (CARDIZEM CD) 180 MG 24 hr capsule Take 1 capsule (180 mg total) by mouth daily.     Allergies:   Shellfish allergy   Social History   Socioeconomic History  . Marital status: Widowed    Spouse name: Not on file  . Number of children: 1  . Years of education: Not on file  . Highest education level: Not on file  Occupational History  . Not on file  Tobacco Use  . Smoking status: Former Smoker    Packs/day: 1.00    Years: 20.00    Pack years: 20.00    Types: Cigarettes    Quit date: 11/22/1992    Years since quitting: 27.7  . Smokeless tobacco: Never Used  Vaping Use  . Vaping Use: Never used  Substance and Sexual Activity  . Alcohol use: Yes    Alcohol/week: 3.0 standard drinks    Types: 3 Standard drinks or equivalent per week    Comment: social  . Drug use: No  . Sexual activity: Not Currently    Partners: Male    Birth control/protection: Post-menopausal  Other Topics Concern  . Not on file  Social History Narrative  . Not on file   Social Determinants of Health   Financial Resource Strain:   . Difficulty of Paying Living Expenses: Not on file  Food Insecurity:   . Worried About Charity fundraiser in the Last Year: Not on file  . Ran Out of Food in the Last Year: Not on file  Transportation Needs:   . Lack of Transportation (Medical): Not on file  . Lack of Transportation (Non-Medical): Not on file  Physical Activity:   . Days of Exercise per Week: Not on file  . Minutes of Exercise per Session: Not on file    Stress:   . Feeling of Stress : Not on file  Social Connections:   . Frequency of Communication with Friends and Family: Not on file  . Frequency of Social Gatherings with Friends and Family: Not on file  . Attends Religious Services: Not on file  . Active Member of Clubs or Organizations: Not on file  .  Attends Archivist Meetings: Not on file  . Marital Status: Not on file     Family History: The patient's family history includes Breast cancer in her sister; Breast cancer (age of onset: 51) in her mother; Cirrhosis in her mother; Heart attack in her father; Heart failure in her father; Kidney Stones in her sister. There is no history of Osteoporosis.  ROS:   Please see the history of present illness.    Concerned that she may have colon cancer.  She has been advised to have resection at [facility] she has follow-up at that using her age as a reason to avoid major invasive procedures.  Appetite has been poor.  Still lives independently.  Has not resorted to walking with a 4 prong walker.  All other systems reviewed and are negative.  EKGs/Labs/Other Studies Reviewed:    The following studies were reviewed today: No new data  EKG:  EKG not repeated  Recent Labs: 09/25/2019: TSH 0.882 05/17/2020: ALT 62; B Natriuretic Peptide 237.4; BUN 20; Creatinine, Ser 0.87; Hemoglobin 8.2; Platelets 160; Potassium 4.4; Sodium 138  Recent Lipid Panel No results found for: CHOL, TRIG, HDL, CHOLHDL, VLDL, LDLCALC, LDLDIRECT  Physical Exam:    VS:  BP (!) 96/58   Pulse 73   Ht 5' 4"  (1.626 m)   Wt 126 lb 6.4 oz (57.3 kg)   SpO2 99%   BMI 21.70 kg/m     Wt Readings from Last 3 Encounters:  08/14/20 126 lb 6.4 oz (57.3 kg)  07/14/20 124 lb (56.2 kg)  05/30/20 115 lb (52.2 kg)     GEN: Elderly and frail with multiple bruises on forearms.. No acute distress HEENT: Normal NECK: No JVD. LYMPHATICS: No lymphadenopathy CARDIAC: Irregularly irregular RR with controlled rate and  without murmur or gallop.  Edema.  His pedal. VASCULAR:  Normal Pulses. No bruits. RESPIRATORY:  Clear to auscultation without rales, wheezing or rhonchi  ABDOMEN: Soft, non-tender, non-distended, No pulsatile mass, MUSCULOSKELETAL: No deformity  SKIN: Warm and dry NEUROLOGIC:  Alert and oriented x 3 PSYCHIATRIC:  Normal affect   ASSESSMENT:    1. S/P mitral valve repair + CABG x1 + maze procedure   2. Persistent atrial fibrillation ([place])   3. Essential hypertension   4. Chronic diastolic HF (heart failure) ([place])   5. Long term (current) use of anticoagulants   6. S/P CABG x 1   7. Educated about COVID-19 virus infection   8. Dizziness   9. Ankle edema    PLAN:    In order of problems listed above:  1. No significant murmur heard on exam 2. Rate is controlled on moderate to high-dose beta-blocker and calcium channel blocker therapy which she is not tolerating well because of low blood pressures with 85 and 654 mmHg systolic.  Her heart rates however have been controlled less than 80 bpm based on her data.  Because of dizziness and exertional fatigue I have decided to discontinue diltiazem which will allow a little higher blood pressure, may help resolve edema, and if heart rate is too fast, we will add Lanoxin on the next office visit in 2 to 4 weeks.  She should call if she worsens. 3. Discontinue diltiazem.  Add Lanoxin if heart rate increases at the time of the next office visit. 4. Continue furosemide 20 mg as needed and continue Lopressor 100 mg twice daily.  Diltiazem was discontinued. 5. Continue Xarelto daily.  Monitor for bleeding. 6. Denies angina. 7. Vaccinated  practicing medication. 8. Probably related to low blood pressure.  Diltiazem discontinued 9. Uncertain etiology but possibly related to calcium channel blocker.  She is elderly, frail, and is dealing with a blood pressure that is far too long.  Lasix, metoprolol, and diltiazem are all contributing.  Have decided  to discontinue diltiazem and if heart rate increases unacceptably, add digoxin.   Medication Adjustments/Labs and Tests Ordered: Current medicines are reviewed at length with the patient today.  Concerns regarding medicines are outlined above.  No orders of the defined types were placed in this encounter.  No orders of the defined types were placed in this encounter.   Patient Instructions  Medication Instructions:  1) DISCONTINUE Diltiazem.  Continue to monitor your blood pressure.  *If you need a refill on your cardiac medications before your next appointment, please call your pharmacy*   Lab Work: None If you have labs (blood work) drawn today and your tests are completely normal, you will receive your results only by: [name] MyChart Message (if you have MyChart) OR . A paper copy in the mail If you have any lab test that is abnormal or we need to change your treatment, we will call you to review the results.   Testing/Procedures: None   Follow-Up: At [facility], you and your health needs are our priority.  As part of our continuing mission to provide you with exceptional heart care, we have created designated Provider Care Teams.  These Care Teams include your primary Cardiologist (physician) and Advanced Practice Providers (APPs -  Physician Assistants and Nurse Practitioners) who all work together to provide you with the care you need, when you need it.  We recommend signing up for the patient portal called "MyChart".  Sign up information is provided on this After Visit Summary.  MyChart is used to connect with patients for Virtual Visits (Telemedicine).  Patients are able to view lab/test results, encounter notes, upcoming appointments, etc.  Non-urgent messages can be sent to your provider as well.   To learn more about what you can do with MyChart, go to [email or web address].    Your next appointment:   1 month(s)  The format for your next appointment:   In  Person  Provider:   You may see Sinclair Grooms, MD or one of the following Advanced Practice Providers on your designated Care Team:    Truitt Merle, NP  Cecilie Kicks, NP  Kathyrn Drown, NP    Other Instructions      Signed, Sinclair Grooms, MD  08/14/2020 1:45 PM    [place]

## 2020-08-14 ENCOUNTER — Ambulatory Visit: Payer: Medicare Other | Admitting: Interventional Cardiology

## 2020-08-14 ENCOUNTER — Other Ambulatory Visit: Payer: Self-pay

## 2020-08-14 ENCOUNTER — Encounter: Payer: Self-pay | Admitting: Interventional Cardiology

## 2020-08-14 VITALS — BP 96/58 | HR 73 | Ht 64.0 in | Wt 126.4 lb

## 2020-08-14 DIAGNOSIS — I4819 Other persistent atrial fibrillation: Secondary | ICD-10-CM

## 2020-08-14 DIAGNOSIS — I10 Essential (primary) hypertension: Secondary | ICD-10-CM

## 2020-08-14 DIAGNOSIS — I5032 Chronic diastolic (congestive) heart failure: Secondary | ICD-10-CM | POA: Diagnosis not present

## 2020-08-14 DIAGNOSIS — Z7901 Long term (current) use of anticoagulants: Secondary | ICD-10-CM

## 2020-08-14 DIAGNOSIS — Z9889 Other specified postprocedural states: Secondary | ICD-10-CM | POA: Diagnosis not present

## 2020-08-14 DIAGNOSIS — Z7189 Other specified counseling: Secondary | ICD-10-CM

## 2020-08-14 DIAGNOSIS — Z951 Presence of aortocoronary bypass graft: Secondary | ICD-10-CM

## 2020-08-14 DIAGNOSIS — M25473 Effusion, unspecified ankle: Secondary | ICD-10-CM

## 2020-08-14 DIAGNOSIS — R42 Dizziness and giddiness: Secondary | ICD-10-CM

## 2020-08-14 NOTE — Patient Instructions (Signed)
Medication Instructions:  1) DISCONTINUE Diltiazem.  Continue to monitor your blood pressure.  *If you need a refill on your cardiac medications before your next appointment, please call your pharmacy*   Lab Work: None If you have labs (blood work) drawn today and your tests are completely normal, you will receive your results only by: [name] MyChart Message (if you have MyChart) OR . A paper copy in the mail If you have any lab test that is abnormal or we need to change your treatment, we will call you to review the results.   Testing/Procedures: None   Follow-Up: At [facility], you and your health needs are our priority.  As part of our continuing mission to provide you with exceptional heart care, we have created designated Provider Care Teams.  These Care Teams include your primary Cardiologist (physician) and Advanced Practice Providers (APPs -  Physician Assistants and Nurse Practitioners) who all work together to provide you with the care you need, when you need it.  We recommend signing up for the patient portal called "MyChart".  Sign up information is provided on this After Visit Summary.  MyChart is used to connect with patients for Virtual Visits (Telemedicine).  Patients are able to view lab/test results, encounter notes, upcoming appointments, etc.  Non-urgent messages can be sent to your provider as well.   To learn more about what you can do with MyChart, go to [email or web address].    Your next appointment:   1 month(s)  The format for your next appointment:   In Person  Provider:   You may see Sinclair Grooms, MD or one of the following Advanced Practice Providers on your designated Care Team:    Truitt Merle, NP  Cecilie Kicks, NP  Kathyrn Drown, NP    Other Instructions

## 2020-08-16 ENCOUNTER — Emergency Department (HOSPITAL_BASED_OUTPATIENT_CLINIC_OR_DEPARTMENT_OTHER): Payer: Medicare Other

## 2020-08-16 ENCOUNTER — Other Ambulatory Visit: Payer: Self-pay

## 2020-08-16 ENCOUNTER — Inpatient Hospital Stay (HOSPITAL_BASED_OUTPATIENT_CLINIC_OR_DEPARTMENT_OTHER)
Admission: EM | Admit: 2020-08-16 | Discharge: 2020-08-23 | DRG: 515 | Disposition: A | Discharge status: Skilled Nursing Facility | Payer: Medicare Other | Attending: Internal Medicine | Admitting: Internal Medicine

## 2020-08-16 ENCOUNTER — Encounter (HOSPITAL_BASED_OUTPATIENT_CLINIC_OR_DEPARTMENT_OTHER): Payer: Self-pay | Admitting: Emergency Medicine

## 2020-08-16 DIAGNOSIS — Z952 Presence of prosthetic heart valve: Secondary | ICD-10-CM

## 2020-08-16 DIAGNOSIS — S22069A Unspecified fracture of T7-T8 vertebra, initial encounter for closed fracture: Secondary | ICD-10-CM

## 2020-08-16 DIAGNOSIS — I110 Hypertensive heart disease with heart failure: Secondary | ICD-10-CM | POA: Diagnosis present

## 2020-08-16 DIAGNOSIS — Z91013 Allergy to seafood: Secondary | ICD-10-CM

## 2020-08-16 DIAGNOSIS — J9601 Acute respiratory failure with hypoxia: Secondary | ICD-10-CM | POA: Diagnosis present

## 2020-08-16 DIAGNOSIS — K5190 Ulcerative colitis, unspecified, without complications: Secondary | ICD-10-CM | POA: Diagnosis present

## 2020-08-16 DIAGNOSIS — M8088XA Other osteoporosis with current pathological fracture, vertebra(e), initial encounter for fracture: Secondary | ICD-10-CM | POA: Diagnosis not present

## 2020-08-16 DIAGNOSIS — Z951 Presence of aortocoronary bypass graft: Secondary | ICD-10-CM

## 2020-08-16 DIAGNOSIS — I4892 Unspecified atrial flutter: Secondary | ICD-10-CM

## 2020-08-16 DIAGNOSIS — Z66 Do not resuscitate: Secondary | ICD-10-CM | POA: Diagnosis not present

## 2020-08-16 DIAGNOSIS — Z7901 Long term (current) use of anticoagulants: Secondary | ICD-10-CM

## 2020-08-16 DIAGNOSIS — M720 Palmar fascial fibromatosis [Dupuytren]: Secondary | ICD-10-CM | POA: Diagnosis present

## 2020-08-16 DIAGNOSIS — E861 Hypovolemia: Secondary | ICD-10-CM | POA: Diagnosis present

## 2020-08-16 DIAGNOSIS — I484 Atypical atrial flutter: Secondary | ICD-10-CM | POA: Diagnosis present

## 2020-08-16 DIAGNOSIS — Z79899 Other long term (current) drug therapy: Secondary | ICD-10-CM

## 2020-08-16 DIAGNOSIS — Z923 Personal history of irradiation: Secondary | ICD-10-CM

## 2020-08-16 DIAGNOSIS — I5032 Chronic diastolic (congestive) heart failure: Secondary | ICD-10-CM | POA: Diagnosis present

## 2020-08-16 DIAGNOSIS — Z7952 Long term (current) use of systemic steroids: Secondary | ICD-10-CM

## 2020-08-16 DIAGNOSIS — I4819 Other persistent atrial fibrillation: Secondary | ICD-10-CM | POA: Diagnosis present

## 2020-08-16 DIAGNOSIS — I2510 Atherosclerotic heart disease of native coronary artery without angina pectoris: Secondary | ICD-10-CM | POA: Diagnosis present

## 2020-08-16 DIAGNOSIS — I4891 Unspecified atrial fibrillation: Secondary | ICD-10-CM | POA: Diagnosis present

## 2020-08-16 DIAGNOSIS — M109 Gout, unspecified: Secondary | ICD-10-CM | POA: Diagnosis present

## 2020-08-16 DIAGNOSIS — I959 Hypotension, unspecified: Secondary | ICD-10-CM | POA: Diagnosis not present

## 2020-08-16 DIAGNOSIS — Z20822 Contact with and (suspected) exposure to covid-19: Secondary | ICD-10-CM | POA: Diagnosis present

## 2020-08-16 DIAGNOSIS — Z79810 Long term (current) use of selective estrogen receptor modulators (SERMs): Secondary | ICD-10-CM

## 2020-08-16 DIAGNOSIS — E785 Hyperlipidemia, unspecified: Secondary | ICD-10-CM | POA: Diagnosis present

## 2020-08-16 DIAGNOSIS — I10 Essential (primary) hypertension: Secondary | ICD-10-CM | POA: Diagnosis present

## 2020-08-16 DIAGNOSIS — D6959 Other secondary thrombocytopenia: Secondary | ICD-10-CM | POA: Diagnosis present

## 2020-08-16 DIAGNOSIS — R6510 Systemic inflammatory response syndrome (SIRS) of non-infectious origin without acute organ dysfunction: Secondary | ICD-10-CM | POA: Diagnosis present

## 2020-08-16 DIAGNOSIS — M549 Dorsalgia, unspecified: Secondary | ICD-10-CM | POA: Diagnosis not present

## 2020-08-16 DIAGNOSIS — Z853 Personal history of malignant neoplasm of breast: Secondary | ICD-10-CM

## 2020-08-16 DIAGNOSIS — D72829 Elevated white blood cell count, unspecified: Secondary | ICD-10-CM | POA: Diagnosis present

## 2020-08-16 DIAGNOSIS — I248 Other forms of acute ischemic heart disease: Secondary | ICD-10-CM | POA: Diagnosis present

## 2020-08-16 DIAGNOSIS — Z515 Encounter for palliative care: Secondary | ICD-10-CM

## 2020-08-16 DIAGNOSIS — Z8679 Personal history of other diseases of the circulatory system: Secondary | ICD-10-CM

## 2020-08-16 DIAGNOSIS — K5900 Constipation, unspecified: Secondary | ICD-10-CM | POA: Diagnosis present

## 2020-08-16 DIAGNOSIS — I5033 Acute on chronic diastolic (congestive) heart failure: Secondary | ICD-10-CM | POA: Diagnosis present

## 2020-08-16 DIAGNOSIS — Z7189 Other specified counseling: Secondary | ICD-10-CM

## 2020-08-16 DIAGNOSIS — S22060A Wedge compression fracture of T7-T8 vertebra, initial encounter for closed fracture: Secondary | ICD-10-CM

## 2020-08-16 DIAGNOSIS — F419 Anxiety disorder, unspecified: Secondary | ICD-10-CM | POA: Diagnosis present

## 2020-08-16 DIAGNOSIS — Z87891 Personal history of nicotine dependence: Secondary | ICD-10-CM

## 2020-08-16 LAB — CBC WITH DIFFERENTIAL/PLATELET
Abs Immature Granulocytes: 0.2 10*3/uL — ABNORMAL HIGH (ref 0.00–0.07)
Basophils Absolute: 0 10*3/uL (ref 0.0–0.1)
Basophils Relative: 0 %
Eosinophils Absolute: 0 10*3/uL (ref 0.0–0.5)
Eosinophils Relative: 0 %
HCT: 43.6 % (ref 36.0–46.0)
Hemoglobin: 13.7 g/dL (ref 12.0–15.0)
Immature Granulocytes: 1 %
Lymphocytes Relative: 9 %
Lymphs Abs: 1.5 10*3/uL (ref 0.7–4.0)
MCH: 33 pg (ref 26.0–34.0)
MCHC: 31.4 g/dL (ref 30.0–36.0)
MCV: 105.1 fL — ABNORMAL HIGH (ref 80.0–100.0)
Monocytes Absolute: 1.1 10*3/uL — ABNORMAL HIGH (ref 0.1–1.0)
Monocytes Relative: 6 %
Neutro Abs: 13.6 10*3/uL — ABNORMAL HIGH (ref 1.7–7.7)
Neutrophils Relative %: 84 %
Platelets: 157 10*3/uL (ref 150–400)
RBC: 4.15 MIL/uL (ref 3.87–5.11)
RDW: 16.3 % — ABNORMAL HIGH (ref 11.5–15.5)
WBC: 16.4 10*3/uL — ABNORMAL HIGH (ref 4.0–10.5)
nRBC: 0 % (ref 0.0–0.2)

## 2020-08-16 LAB — RESPIRATORY PANEL BY RT PCR (FLU A&B, COVID)
Influenza A by PCR: NEGATIVE
Influenza B by PCR: NEGATIVE
SARS Coronavirus 2 by RT PCR: NEGATIVE

## 2020-08-16 LAB — COMPREHENSIVE METABOLIC PANEL
ALT: 37 U/L (ref 0–44)
AST: 34 U/L (ref 15–41)
Albumin: 3.7 g/dL (ref 3.5–5.0)
Alkaline Phosphatase: 44 U/L (ref 38–126)
Anion gap: 13 (ref 5–15)
BUN: 20 mg/dL (ref 8–23)
CO2: 22 mmol/L (ref 22–32)
Calcium: 8.9 mg/dL (ref 8.9–10.3)
Chloride: 103 mmol/L (ref 98–111)
Creatinine, Ser: 0.82 mg/dL (ref 0.44–1.00)
GFR calc Af Amer: >60 mL/min (ref >60)
GFR calc non Af Amer: >60 mL/min (ref >60)
Glucose, Bld: 111 mg/dL — ABNORMAL HIGH (ref 70–99)
Potassium: 4.2 mmol/L (ref 3.5–5.1)
Sodium: 138 mmol/L (ref 135–145)
Total Bilirubin: 0.7 mg/dL (ref 0.3–1.2)
Total Protein: 7.1 g/dL (ref 6.5–8.1)

## 2020-08-16 LAB — BRAIN NATRIURETIC PEPTIDE: B Natriuretic Peptide: 482 pg/mL — ABNORMAL HIGH (ref 0.0–100.0)

## 2020-08-16 LAB — TROPONIN I (HIGH SENSITIVITY): Troponin I (High Sensitivity): 24 ng/L — ABNORMAL HIGH (ref <18)

## 2020-08-16 MED ORDER — MORPHINE SULFATE (PF) 4 MG/ML IV SOLN
4.0000 mg | Freq: Once | INTRAVENOUS | Status: AC
  Administered 2020-08-16: 4 mg via INTRAMUSCULAR
  Filled 2020-08-16: qty 1

## 2020-08-16 MED ORDER — ONDANSETRON 4 MG PO TBDP
4.0000 mg | ORAL_TABLET | Freq: Once | ORAL | Status: AC
  Administered 2020-08-16: 4 mg via ORAL
  Filled 2020-08-16: qty 1

## 2020-08-16 MED ORDER — DILTIAZEM HCL 30 MG PO TABS: 30.0000 mg | ORAL_TABLET | Freq: Once | ORAL | Status: DC

## 2020-08-16 MED ORDER — PROMETHAZINE HCL 25 MG/ML IJ SOLN
INTRAMUSCULAR | Status: AC
  Filled 2020-08-16: qty 1

## 2020-08-16 MED ORDER — DILTIAZEM HCL-DEXTROSE 125-5 MG/125ML-% IV SOLN (PREMIX)
5.0000 mg/h | INTRAVENOUS | Status: DC
  Administered 2020-08-16: 5 mg/h via INTRAVENOUS
  Filled 2020-08-16: qty 125

## 2020-08-16 MED ORDER — HYDROCODONE-ACETAMINOPHEN 5-325 MG PO TABS
2.0000 | ORAL_TABLET | Freq: Once | ORAL | Status: AC
  Administered 2020-08-16: 2 via ORAL
  Filled 2020-08-16: qty 2

## 2020-08-16 MED ORDER — RIVAROXABAN 15 MG PO TABS
ORAL_TABLET | ORAL | Status: AC
  Administered 2020-08-16: 15 mg via ORAL
  Filled 2020-08-16: qty 1

## 2020-08-16 MED ORDER — HYDROCODONE-ACETAMINOPHEN 5-325 MG PO TABS: 1.0000 | ORAL_TABLET | ORAL | 0 refills | Status: DC | PRN

## 2020-08-16 MED ORDER — METOPROLOL TARTRATE 25 MG PO TABS
100.0000 mg | ORAL_TABLET | Freq: Once | ORAL | Status: AC
  Administered 2020-08-16: 100 mg via ORAL
  Filled 2020-08-16: qty 4

## 2020-08-16 MED ORDER — RIVAROXABAN 15 MG PO TABS: 15.0000 mg | ORAL_TABLET | Freq: Once | ORAL | Status: AC

## 2020-08-16 MED ORDER — PROMETHAZINE HCL 25 MG/ML IJ SOLN
6.2500 mg | Freq: Once | INTRAMUSCULAR | Status: AC
  Administered 2020-08-16: 6.25 mg via INTRAMUSCULAR

## 2020-08-16 MED ORDER — MORPHINE SULFATE (PF) 4 MG/ML IV SOLN
4.0000 mg | Freq: Once | INTRAVENOUS | Status: AC
  Administered 2020-08-16: 4 mg via INTRAVENOUS
  Filled 2020-08-16: qty 1

## 2020-08-16 MED ORDER — FENTANYL CITRATE (PF) 100 MCG/2ML IJ SOLN
50.0000 ug | Freq: Once | INTRAMUSCULAR | Status: AC
  Administered 2020-08-16: 50 ug via INTRAVENOUS
  Filled 2020-08-16: qty 2

## 2020-08-16 MED ORDER — FUROSEMIDE 10 MG/ML IJ SOLN
20.0000 mg | Freq: Once | INTRAMUSCULAR | Status: AC
  Administered 2020-08-16: 20 mg via INTRAVENOUS
  Filled 2020-08-16: qty 2

## 2020-08-16 MED ORDER — DILTIAZEM LOAD VIA INFUSION
10.0000 mg | Freq: Once | INTRAVENOUS | Status: AC
  Administered 2020-08-16: 10 mg via INTRAVENOUS
  Filled 2020-08-16: qty 10

## 2020-08-16 NOTE — ED Triage Notes (Signed)
Pt here with bilateral thoracic back pain

## 2020-08-16 NOTE — ED Notes (Signed)
Patient transported to X-ray 

## 2020-08-16 NOTE — ED Provider Notes (Signed)
°  Physical Exam  BP 129/84    Pulse 66    Resp 18    SpO2 100%   Physical Exam  ED Course/Procedures     .Critical Care Performed by: Gareth Morgan, MD Authorized by: Gareth Morgan, MD   Critical care provider statement:    Critical care time (minutes):  30   Critical care was time spent personally by me on the following activities:  Discussions with consultants, evaluation of patient's response to treatment, examination of patient, ordering and performing treatments and interventions, ordering and review of laboratory studies, ordering and review of radiographic studies, pulse oximetry, re-evaluation of patient's condition, obtaining history from patient or surrogate and review of old charts    MDM  79yo female with hx of CAD s/p CABG s/p CABG and severe MR s/p repair September 2019, breast cancer, hypertension, atrial fibrillation (despite prior Maze, atrial appendage ligation, amiodarone) with plan for rate control who presented with acute onset back pain.  Please see Dr. Elyse Hsu note for prior history, physical and care.  CT completed showed T7 fracture with 75% height loss.  Plan is for MRI for further evaluation.   MRI thoracic spine shows fracture with slight retropulsion, however without any significant canal stenosis.  Her neurologic exam is intact.  Spoke with Dr. Marcello Moores of neurosurgery who recommends 2-week follow-up.  She has a TLSO brace at home.  Family and patient discussed concerns about her ability to take care of herself living alone with pain from her thoracic fracture.  Plan was to attempt to ambulate with pain control, however on repeat vital signs, she was found to be in atrial fibrillation with RVR with a rate up to 150, and oxygen saturation down to 85% on room air.  Further history obtained--she denies feeling shortness of breath, but notes that she saw [name] a few days ago and stopped diltiazem (with plan if HR increases unacceptably to add digoxin.)   Suspect her atrial fibrillation with variable rates and elevated rate are secondary to recent discontinuation of her diltiazem.  Suspect volume overload related to her atrial fibrillation with increased rate resulting in acute hypoxic respiratory failure.  BNP elevated from prior.  Slight elevation of troponin consistent with strain related to tachycardia.  Initially ordered diltiazem bolus and drip.  Was given 20 mg of Lasix IV.  Plan to admit to [facility] for further treatment of volume overload, atrial fibrillation, as well as continued care need for physical therapy and placement for pain related to acute T7 compression fracture.      Gareth Morgan, MD 08/17/20 1521

## 2020-08-16 NOTE — ED Notes (Signed)
Called Ruby at [facility] regarding neurosurgery consult

## 2020-08-16 NOTE — ED Provider Notes (Signed)
[facility] EMERGENCY DEPARTMENT Provider Note   CSN: [id number] Arrival date & time: 08/16/20  1131     History Chief Complaint  Patient presents with   Back Pain    Michele Green is a 79 y.o. female.  Pt presents to the ED today with pain in her back.  She said it started last night after moving.  No fall.  She has a TLSO brace from prior injuries that she's put on to help with the pain. Pt said the pain is severe.          Past Medical History:  Diagnosis Date   Arrhythmia    Atypical atrial flutter ([place]) 08/14/2018   Post-operative   Breast cancer of upper-outer quadrant of left female breast ([place]) 02/02/2016   CAD (coronary artery disease) 06/20/2018   LHC 7/19: pLAD 65/90, oD1 90, mLCx 85, OM2 50, oRCA 30, EF 50-55 >> s/p CABG in 9/19 (L-LAD)   Colon polyp 02/2012   Dupuytren contracture    bilateral hands   Family history of breast cancer    Hyperlipidemia    Hypertension    Mitral regurgitation 11/07/2013   Echo 7/19: Mild LVH, EF 60-65, no RWMA, mod ot severe MR, massive LAE, PASP 33 // TEE 7/19:  Mild conc LVH, EF 60-65, no RWMA, severe MR with mild post leaflet prolapse, massive // s/p MV repair 07/2018   On continuous oral anticoagulation 08/22/2015   Started on Xarelto 08/12/2015    Osteopenia    Persistent atrial fibrillation ([place]) 08/22/2015   Started late August or early September 2016 // s/p Maze procedure 07/2018   Personal history of radiation therapy    Radiation Therapy 04/21/16-05/19/16   left breast 47.72 Gy, boosted to 10 Gy   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   LIMA to LAD   S/P Maze operation for atrial fibrillation 08/10/2018   Complete bilateral atrial lesion set using bipolar radiofrequency and cryothermy ablation with clipping of LA appendage   S/P MVR (mitral valve repair) 08/10/2018   Complex valvuloplasty including Gore-tex neochord placement x8, Plication of Lateral Commissure and 75m Sorin Memo 4D Ring Annuloplasty SN#  [id number]  Ulcerative colitis ([place]    Wears glasses     Patient Active Problem List   Diagnosis Date Noted   Long term (current) use of anticoagulants 08/28/2018   Atypical atrial flutter ([place] 08/14/2018   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   S/P mitral valve repair 08/10/2018   S/P Maze operation for atrial fibrillation 08/10/2018   Chronic diastolic HF (heart failure) ([place] 06/20/2018   Osteoporosis 09/29/2017   Loosening of hardware in spine ([place] 08/23/2017   Spinal stenosis of lumbar region 08/11/2016   Unspecified cord compression ([place] 08/11/2016   Unstable burst fracture of third lumbar vertebra ([place] 08/11/2016   Genetic testing 03/17/2016   Family history of breast cancer    Breast cancer of upper-outer quadrant of left female breast ([place] 02/02/2016   On amiodarone therapy 09/09/2015   On continuous oral anticoagulation 08/22/2015   Persistent atrial fibrillation ([place] 08/22/2015   Essential hypertension 11/07/2013   Mitral regurgitation 11/07/2013    Past Surgical History:  Procedure Laterality Date   BIOPSY  07/14/2020   Procedure: BIOPSY;  Surgeon: KRonnette Juniper MD;  [facility];  Service: Gastroenterology;;   BREAST BIOPSY Left 01/28/2016   BREAST LUMPECTOMY Left 02/23/2016   BREAST LUMPECTOMY WITH RADIOACTIVE SEED LOCALIZATION Left 02/23/2016   Procedure: BREAST LUMPECTOMY WITH RADIOACTIVE SEED LOCALIZATION;  Surgeon: [name], MD;  Location: [facility];  Service: General;  Laterality: Left;   CARDIAC CATHETERIZATION     CARDIOVERSION N/A 10/02/2015   Procedure: CARDIOVERSION;  Surgeon: Jerline Pain, MD;  Location: [facility];  Service: Cardiovascular;  Laterality: N/A;   CARDIOVERSION N/A 10/05/2018   Procedure: CARDIOVERSION;  Surgeon: Fay Records, MD;  Location: [facility] ENDOSCOPY;  Service: Cardiovascular;  Laterality: N/A;   CARDIOVERSION N/A 11/05/2019   Procedure: CARDIOVERSION;  Surgeon: Dorothy Spark, MD;  Location: [facility] ENDOSCOPY;  Service: Cardiovascular;  Laterality: N/A;   CLIPPING OF ATRIAL APPENDAGE  08/10/2018   Procedure: CLIPPING OF ATRIAL APPENDAGE;  Surgeon: Rexene Alberts, MD;  Location: [facility];  Service: Open Heart Surgery;;   COLONOSCOPY     COLONOSCOPY WITH PROPOFOL N/A 07/14/2020   Procedure: COLONOSCOPY WITH PROPOFOL;  Surgeon: Ronnette Juniper, MD;  [facility];  Service: Gastroenterology;  Laterality: N/A;   CORONARY ARTERY BYPASS GRAFT N/A 08/10/2018   Procedure: CORONARY ARTERY BYPASS GRAFTING (CABG) x 1, LIMA-LAD,  USING LEFT INTERNAL MAMMARY ARTERY. HARVESTED RIGHT GREATER SAPHENOUS VEIN ENDOSCOPICALLY;  Surgeon: Rexene Alberts, MD;  Location: [facility];  Service: Open Heart Surgery;  Laterality: N/A;   CYSTOSCOPY W/ RETROGRADES Bilateral 06/20/2019   Procedure: CYSTOSCOPY WITH RETROGRADE PYELOGRAM;  Surgeon: Ardis Hughs, MD;  Location: [facility];  Service: Urology;  Laterality: Bilateral;   CYSTOSCOPY WITH HYDRODISTENSION AND BIOPSY N/A 06/20/2019   Procedure: CYSTOSCOPY BLADDER  BIOPSY WITH FULGERATION;  Surgeon: Ardis Hughs, MD;  Location: [facility];  Service: Urology;  Laterality: N/A;   DUPUYTREN CONTRACTURE RELEASE  2001   leftx2   DUPUYTREN CONTRACTURE RELEASE  1990   right   DUPUYTREN CONTRACTURE RELEASE Right 05/02/2014   Procedure: EXCISION DUPUYTRENS RIGHT PALMAR/SMALL ;  Surgeon: Cammie Sickle, MD;  Location: [facility];  Service: Orthopedics;  Laterality: Right;   HEMOSTASIS CLIP PLACEMENT  07/14/2020   Procedure: HEMOSTASIS CLIP PLACEMENT;  Surgeon: Ronnette Juniper, MD;  [facility];  Service: Gastroenterology;;   MAZE N/A 08/10/2018   Procedure: MAZE;  Surgeon: Rexene Alberts, MD;  Location: [facility];  Service: Open Heart Surgery;  Laterality: N/A;   MITRAL VALVE REPAIR N/A 08/10/2018   Procedure: MITRAL VALVE REPAIR (MVR);  Surgeon: Rexene Alberts, MD;  Location: [facility];  Service: Open Heart Surgery;  Laterality: N/A;  glutaraldehyde   POLYPECTOMY  07/14/2020   Procedure: POLYPECTOMY;  Surgeon: Ronnette Juniper, MD;  [facility];  Service: Gastroenterology;;   RIGHT/LEFT HEART CATH AND CORONARY ANGIOGRAPHY N/A 06/20/2018   Procedure: RIGHT/LEFT HEART CATH AND CORONARY ANGIOGRAPHY;  Surgeon: [name], MD;  Location: [facility];  Service: Cardiovascular;  Laterality: N/A;   TEE WITHOUT CARDIOVERSION N/A 06/20/2018   Procedure: TRANSESOPHAGEAL ECHOCARDIOGRAM (TEE);  Surgeon: Pixie Casino, MD;  Location: [facility] ENDOSCOPY;  Service: Cardiovascular;  Laterality: N/A;   TEE WITHOUT CARDIOVERSION N/A 08/10/2018   Procedure: TRANSESOPHAGEAL ECHOCARDIOGRAM (TEE);  Surgeon: Rexene Alberts, MD;  Location: [facility];  Service: Open Heart Surgery;  Laterality: N/A;   TONSILLECTOMY  age 16     OB History    Gravida  1   Para  1   Term  1   Preterm      AB      Living  1     SAB      TAB      Ectopic  Multiple      Live Births              Family History  Problem Relation Age of Onset   Cirrhosis Mother    Breast cancer Mother 73   Heart failure Father    Heart attack Father    Breast cancer Sister        early 67's   Kidney Stones Sister        loss of kidney due to stones   Osteoporosis Neg Hx     Social History   Tobacco Use   Smoking status: Former Smoker    Packs/day: 1.00    Years: 20.00    Pack years: 20.00    Types: Cigarettes    Quit date: 11/22/1992    Years since quitting: 27.7   Smokeless tobacco: Never Used  Vaping Use   Vaping Use: Never used  Substance Use Topics   Alcohol use: Yes    Alcohol/week: 3.0 standard drinks    Types: 3 Standard drinks or equivalent per week    Comment: social   Drug use: No    Home Medications Prior to Admission medications   Medication Sig Start Date End Date Taking? Authorizing Provider  acetaminophen (TYLENOL) 500 MG tablet Take 500-1,000 mg  by mouth every 6 (six) hours as needed for moderate pain or headache.    [provider]  allopurinol (ZYLOPRIM) 100 MG tablet Take 200 mg by mouth every evening.  06/21/13   [provider]  amoxicillin (AMOXIL) 500 MG tablet Take 4 tablets (2,000 mg total) by mouth as needed (TO BE USED FOR DENTAL PROCEDURE). Take 4 tabs 30-60 minutes before dental cleaning/procedure 07/29/20   [name], MD  atorvastatin (LIPITOR) 20 MG tablet TAKE 1 TABLET BY MOUTH EVERY DAY 04/07/20   Camnitz, Ocie Doyne, MD  furosemide (LASIX) 20 MG tablet Take 20 mg by mouth daily as needed (for swelling/fluid retention.).     [provider]  HYDROcodone-acetaminophen (NORCO/VICODIN) 5-325 MG tablet Take 1 tablet by mouth every 4 (four) hours as needed. 08/16/20   Isla Pence, MD  KLOR-CON M20 20 MEQ tablet TAKE 1 TABLET BY MOUTH EVERY DAY 05/21/19   [name], MD  mesalamine (APRISO) 0.375 g 24 hr capsule Take 1.5 g by mouth daily.  06/01/19   [provider]  metoprolol tartrate (LOPRESSOR) 100 MG tablet TAKE 1 TABLET BY MOUTH TWICE A DAY 03/10/20   [name], MD  phenazopyridine (PYRIDIUM) 200 MG tablet Take 1 tablet (200 mg total) by mouth 3 (three) times daily as needed for pain. 06/20/19   Ardis Hughs, MD  predniSONE (STERAPRED UNI-PAK 21 TAB) 10 MG (21) TBPK tablet Take 20 mg by mouth daily. Take as directed - for C-Diff     [provider]  tamoxifen (NOLVADEX) 20 MG tablet TAKE 1 TABLET BY MOUTH EVERY DAY 03/03/20   Truitt Merle, MD  traMADol (ULTRAM) 50 MG tablet Take 1-2 tablets (50-100 mg total) by mouth every 6 (six) hours as needed for moderate pain. 06/20/19   Ardis Hughs, MD  XARELTO 15 MG TABS tablet TAKE 1 TABLET BY MOUTH  DAILY WITH SUPPER 03/24/20   [name], MD    Allergies    Shellfish allergy  Review of Systems   Review of Systems  Musculoskeletal: Positive for back pain.  All other systems reviewed and are  negative.   Physical Exam Updated Vital Signs BP 129/84  Pulse 66    Resp 18    SpO2 100%   Physical Exam Vitals and nursing note reviewed.  Constitutional:      Appearance: Normal appearance.  HENT:     Head: Normocephalic and atraumatic.     Right Ear: External ear normal.     Left Ear: External ear normal.     Nose: Nose normal.     Mouth/Throat:     Mouth: Mucous membranes are moist.     Pharynx: Oropharynx is clear.  Eyes:     Extraocular Movements: Extraocular movements intact.     Conjunctiva/sclera: Conjunctivae normal.     Pupils: Pupils are equal, round, and reactive to light.  Cardiovascular:     Rate and Rhythm: Tachycardia present. Rhythm irregular.     Pulses: Normal pulses.     Heart sounds: Normal heart sounds.  Pulmonary:     Effort: Pulmonary effort is normal.     Breath sounds: Normal breath sounds.  Abdominal:     General: Abdomen is flat. Bowel sounds are normal.     Palpations: Abdomen is soft.  Musculoskeletal:     Cervical back: Normal range of motion and neck supple.       Back:  Skin:    General: Skin is warm.     Capillary Refill: Capillary refill takes less than 2 seconds.  Neurological:     General: No focal deficit present.     Mental Status: She is alert and oriented to person, place, and time.  Psychiatric:        Mood and Affect: Mood normal.        Behavior: Behavior normal.     ED Results / Procedures / Treatments   Labs (all labs ordered are listed, but only abnormal results are displayed) Labs Reviewed - No data to display  EKG EKG Interpretation  Date/Time:  Saturday August 16 2020 11:53:59 EDT Ventricular Rate:  124 PR Interval:    QRS Duration: 80 QT Interval:  340 QTC Calculation: 488 R Axis:   79 Text Interpretation: Atrial flutter with variable A-V block ST & T wave abnormality, consider anterolateral ischemia Abnormal ECG Since last tracing rate faster Confirmed by Isla Pence [phone]) on 08/16/2020  2:29:26 PM   Radiology DG Thoracic Spine W/Swimmers  Result Date: 08/16/2020 CLINICAL DATA:  79 year old female presenting with upper back pain. EXAM: THORACIC SPINE - 3 VIEWS COMPARISON:  Chest radiograph from May 31 21, CT abdomen pelvis from 05/22/2020, and chest radiograph from 05/17/2020 FINDINGS: Interval anterior wedge compression deformity of the T7 vertebral body with associated focal kyphosis at this level. There is approximately 75% anterior vertebral body height loss. Unchanged chronic compression fractures of the L1-L2 vertebral bodies. Diffuse osteopenia. Similar appearing thoracic and abdominal aortic atherosclerotic calcifications. Median sternotomy wires in place. Left atrial appendage clip and mitral prosthesis in place. IMPRESSION: Acute compression fracture of the T7 vertebral body with approximately 75% anterior vertebral body height loss. Consider thoracic spine MRI for further characterization. Diffuse osteopenia. Aortic Atherosclerosis (ICD10-I70.0). These results were called by telephone at the time of interpretation on 08/16/2020 at 2:28 pm to provider [facility] , who verbally acknowledged these results. Electronically Signed   By: Ruthann Cancer MD   On: 08/16/2020 14:29    Procedures Procedures (including critical care time)  Medications Ordered in ED Medications  morphine 4 MG/ML injection 4 mg (4 mg Intramuscular Given 08/16/20 1318)  ondansetron (ZOFRAN-ODT) disintegrating tablet 4 mg (4 mg Oral Given 08/16/20  1318)  promethazine (PHENERGAN) injection 6.25 mg (6.25 mg Intramuscular Given 08/16/20 1427)  morphine 4 MG/ML injection 4 mg (4 mg Intravenous Given 08/16/20 1514)    ED Course  I have reviewed the triage vital signs and the nursing notes.  Pertinent labs & imaging results that were available during my care of the patient were reviewed by me and considered in my medical decision making (see chart for details).    MDM Rules/Calculators/A&P                           Radiology recommended a MRI thoracic spine, so that has been ordered.  If no cord involvement, pt can go home with pain meds.  She has a TLSO brace.  She is signed out to Dr. Billy Fischer at shift change.  Final Clinical Impression(s) / ED Diagnoses Final diagnoses:  Closed wedge compression fracture of T7 vertebra, initial encounter ([place])    Rx / DC Orders ED Discharge Orders         Ordered    HYDROcodone-acetaminophen (NORCO/VICODIN) 5-325 MG tablet  Every 4 hours PRN        08/16/20 1534           Isla Pence, MD 08/16/20 1535

## 2020-08-16 NOTE — ED Notes (Signed)
O2 2L [place] applied; pt is alert, appears uncomfortable d/t pain, restless.

## 2020-08-17 DIAGNOSIS — M8088XA Other osteoporosis with current pathological fracture, vertebra(e), initial encounter for fracture: Secondary | ICD-10-CM | POA: Diagnosis present

## 2020-08-17 DIAGNOSIS — Z91013 Allergy to seafood: Secondary | ICD-10-CM | POA: Diagnosis not present

## 2020-08-17 DIAGNOSIS — I4819 Other persistent atrial fibrillation: Secondary | ICD-10-CM

## 2020-08-17 DIAGNOSIS — I10 Essential (primary) hypertension: Secondary | ICD-10-CM | POA: Diagnosis not present

## 2020-08-17 DIAGNOSIS — Z7952 Long term (current) use of systemic steroids: Secondary | ICD-10-CM | POA: Diagnosis not present

## 2020-08-17 DIAGNOSIS — I110 Hypertensive heart disease with heart failure: Secondary | ICD-10-CM | POA: Diagnosis present

## 2020-08-17 DIAGNOSIS — I4891 Unspecified atrial fibrillation: Secondary | ICD-10-CM

## 2020-08-17 DIAGNOSIS — I959 Hypotension, unspecified: Secondary | ICD-10-CM | POA: Diagnosis not present

## 2020-08-17 DIAGNOSIS — S22069A Unspecified fracture of T7-T8 vertebra, initial encounter for closed fracture: Secondary | ICD-10-CM | POA: Diagnosis not present

## 2020-08-17 DIAGNOSIS — Z515 Encounter for palliative care: Secondary | ICD-10-CM | POA: Diagnosis not present

## 2020-08-17 DIAGNOSIS — K5190 Ulcerative colitis, unspecified, without complications: Secondary | ICD-10-CM | POA: Diagnosis present

## 2020-08-17 DIAGNOSIS — I484 Atypical atrial flutter: Secondary | ICD-10-CM | POA: Diagnosis present

## 2020-08-17 DIAGNOSIS — I2510 Atherosclerotic heart disease of native coronary artery without angina pectoris: Secondary | ICD-10-CM | POA: Diagnosis present

## 2020-08-17 DIAGNOSIS — I5033 Acute on chronic diastolic (congestive) heart failure: Secondary | ICD-10-CM | POA: Diagnosis present

## 2020-08-17 DIAGNOSIS — I5032 Chronic diastolic (congestive) heart failure: Secondary | ICD-10-CM | POA: Diagnosis not present

## 2020-08-17 DIAGNOSIS — Z87891 Personal history of nicotine dependence: Secondary | ICD-10-CM | POA: Diagnosis not present

## 2020-08-17 DIAGNOSIS — Z952 Presence of prosthetic heart valve: Secondary | ICD-10-CM | POA: Diagnosis not present

## 2020-08-17 DIAGNOSIS — F419 Anxiety disorder, unspecified: Secondary | ICD-10-CM | POA: Diagnosis present

## 2020-08-17 DIAGNOSIS — E785 Hyperlipidemia, unspecified: Secondary | ICD-10-CM | POA: Diagnosis present

## 2020-08-17 DIAGNOSIS — D6959 Other secondary thrombocytopenia: Secondary | ICD-10-CM | POA: Diagnosis present

## 2020-08-17 DIAGNOSIS — Z20822 Contact with and (suspected) exposure to covid-19: Secondary | ICD-10-CM | POA: Diagnosis present

## 2020-08-17 DIAGNOSIS — Z951 Presence of aortocoronary bypass graft: Secondary | ICD-10-CM | POA: Diagnosis not present

## 2020-08-17 DIAGNOSIS — R6510 Systemic inflammatory response syndrome (SIRS) of non-infectious origin without acute organ dysfunction: Secondary | ICD-10-CM | POA: Diagnosis present

## 2020-08-17 DIAGNOSIS — S22060A Wedge compression fracture of T7-T8 vertebra, initial encounter for closed fracture: Secondary | ICD-10-CM | POA: Diagnosis not present

## 2020-08-17 DIAGNOSIS — Z923 Personal history of irradiation: Secondary | ICD-10-CM | POA: Diagnosis not present

## 2020-08-17 DIAGNOSIS — M549 Dorsalgia, unspecified: Secondary | ICD-10-CM | POA: Diagnosis present

## 2020-08-17 DIAGNOSIS — Z66 Do not resuscitate: Secondary | ICD-10-CM | POA: Diagnosis not present

## 2020-08-17 DIAGNOSIS — E861 Hypovolemia: Secondary | ICD-10-CM | POA: Diagnosis present

## 2020-08-17 DIAGNOSIS — Z7901 Long term (current) use of anticoagulants: Secondary | ICD-10-CM | POA: Diagnosis not present

## 2020-08-17 DIAGNOSIS — Z7189 Other specified counseling: Secondary | ICD-10-CM | POA: Diagnosis not present

## 2020-08-17 DIAGNOSIS — J9601 Acute respiratory failure with hypoxia: Secondary | ICD-10-CM | POA: Diagnosis present

## 2020-08-17 DIAGNOSIS — T148XXA Other injury of unspecified body region, initial encounter: Secondary | ICD-10-CM | POA: Diagnosis not present

## 2020-08-17 DIAGNOSIS — I4892 Unspecified atrial flutter: Secondary | ICD-10-CM | POA: Diagnosis not present

## 2020-08-17 DIAGNOSIS — I248 Other forms of acute ischemic heart disease: Secondary | ICD-10-CM | POA: Diagnosis present

## 2020-08-17 DIAGNOSIS — Z853 Personal history of malignant neoplasm of breast: Secondary | ICD-10-CM | POA: Diagnosis not present

## 2020-08-17 LAB — CBC WITH DIFFERENTIAL/PLATELET
Abs Immature Granulocytes: 0.24 10*3/uL — ABNORMAL HIGH (ref 0.00–0.07)
Basophils Absolute: 0 10*3/uL (ref 0.0–0.1)
Basophils Relative: 0 %
Eosinophils Absolute: 0.1 10*3/uL (ref 0.0–0.5)
Eosinophils Relative: 1 %
HCT: 39.9 % (ref 36.0–46.0)
Hemoglobin: 12.8 g/dL (ref 12.0–15.0)
Immature Granulocytes: 2 %
Lymphocytes Relative: 15 %
Lymphs Abs: 1.6 10*3/uL (ref 0.7–4.0)
MCH: 33.1 pg (ref 26.0–34.0)
MCHC: 32.1 g/dL (ref 30.0–36.0)
MCV: 103.1 fL — ABNORMAL HIGH (ref 80.0–100.0)
Monocytes Absolute: 0.9 10*3/uL (ref 0.1–1.0)
Monocytes Relative: 8 %
Neutro Abs: 8 10*3/uL — ABNORMAL HIGH (ref 1.7–7.7)
Neutrophils Relative %: 74 %
Platelets: 128 10*3/uL — ABNORMAL LOW (ref 150–400)
RBC: 3.87 MIL/uL (ref 3.87–5.11)
RDW: 15.9 % — ABNORMAL HIGH (ref 11.5–15.5)
WBC: 10.7 10*3/uL — ABNORMAL HIGH (ref 4.0–10.5)
nRBC: 0 % (ref 0.0–0.2)

## 2020-08-17 LAB — MRSA PCR SCREENING: MRSA by PCR: NEGATIVE

## 2020-08-17 LAB — GLUCOSE, CAPILLARY: Glucose-Capillary: 91 mg/dL (ref 70–99)

## 2020-08-17 LAB — TROPONIN I (HIGH SENSITIVITY)
Troponin I (High Sensitivity): 27 ng/L — ABNORMAL HIGH (ref <18)
Troponin I (High Sensitivity): 33 ng/L — ABNORMAL HIGH (ref <18)
Troponin I (High Sensitivity): 37 ng/L — ABNORMAL HIGH (ref <18)

## 2020-08-17 LAB — BASIC METABOLIC PANEL
Anion gap: 12 (ref 5–15)
BUN: 16 mg/dL (ref 8–23)
CO2: 24 mmol/L (ref 22–32)
Calcium: 8.4 mg/dL — ABNORMAL LOW (ref 8.9–10.3)
Chloride: 105 mmol/L (ref 98–111)
Creatinine, Ser: 0.81 mg/dL (ref 0.44–1.00)
GFR calc Af Amer: >60 mL/min (ref >60)
GFR calc non Af Amer: >60 mL/min (ref >60)
Glucose, Bld: 83 mg/dL (ref 70–99)
Potassium: 3.9 mmol/L (ref 3.5–5.1)
Sodium: 141 mmol/L (ref 135–145)

## 2020-08-17 LAB — URINALYSIS, ROUTINE W REFLEX MICROSCOPIC
Bilirubin Urine: NEGATIVE
Glucose, UA: NEGATIVE mg/dL
Hgb urine dipstick: NEGATIVE
Ketones, ur: NEGATIVE mg/dL
Leukocytes,Ua: NEGATIVE
Nitrite: NEGATIVE
Protein, ur: NEGATIVE mg/dL
Specific Gravity, Urine: 1.011 (ref 1.005–1.030)
pH: 5 (ref 5.0–8.0)

## 2020-08-17 LAB — CBG MONITORING, ED: Glucose-Capillary: 92 mg/dL (ref 70–99)

## 2020-08-17 MED ORDER — FUROSEMIDE 20 MG PO TABS: 20.0000 mg | ORAL_TABLET | Freq: Every day | ORAL | Status: DC | PRN

## 2020-08-17 MED ORDER — AMIODARONE IV BOLUS ONLY 150 MG/100ML
150.0000 mg | Freq: Once | INTRAVENOUS | Status: AC
  Administered 2020-08-17: 150 mg via INTRAVENOUS
  Filled 2020-08-17: qty 100

## 2020-08-17 MED ORDER — HYDROMORPHONE HCL 1 MG/ML IJ SOLN
0.5000 mg | INTRAMUSCULAR | Status: DC | PRN
  Administered 2020-08-17: 0.5 mg via INTRAVENOUS
  Filled 2020-08-17: qty 0.5

## 2020-08-17 MED ORDER — ALLOPURINOL 100 MG PO TABS
200.0000 mg | ORAL_TABLET | Freq: Every evening | ORAL | Status: DC
  Administered 2020-08-17 – 2020-08-22 (×6): 200 mg via ORAL
  Filled 2020-08-17 (×6): qty 2

## 2020-08-17 MED ORDER — POTASSIUM CHLORIDE CRYS ER 20 MEQ PO TBCR
20.0000 meq | EXTENDED_RELEASE_TABLET | Freq: Every day | ORAL | Status: DC
  Administered 2020-08-17 – 2020-08-20 (×4): 20 meq via ORAL
  Filled 2020-08-17 (×4): qty 1

## 2020-08-17 MED ORDER — ENOXAPARIN SODIUM 40 MG/0.4ML ~~LOC~~ SOLN
40.0000 mg | SUBCUTANEOUS | Status: DC
  Administered 2020-08-17 – 2020-08-18 (×2): 40 mg via SUBCUTANEOUS
  Filled 2020-08-17 (×2): qty 0.4

## 2020-08-17 MED ORDER — SENNOSIDES-DOCUSATE SODIUM 8.6-50 MG PO TABS
2.0000 | ORAL_TABLET | Freq: Two times a day (BID) | ORAL | Status: DC
  Administered 2020-08-17 – 2020-08-20 (×5): 2 via ORAL
  Filled 2020-08-17 (×11): qty 2

## 2020-08-17 MED ORDER — METOPROLOL TARTRATE 50 MG PO TABS
50.0000 mg | ORAL_TABLET | Freq: Two times a day (BID) | ORAL | Status: DC
  Administered 2020-08-17 – 2020-08-18 (×4): 50 mg via ORAL
  Filled 2020-08-17 (×4): qty 1

## 2020-08-17 MED ORDER — METOPROLOL TARTRATE 50 MG PO TABS: 50.0000 mg | ORAL_TABLET | Freq: Two times a day (BID) | ORAL | Status: DC

## 2020-08-17 MED ORDER — OXYCODONE HCL 5 MG PO TABS
5.0000 mg | ORAL_TABLET | ORAL | Status: DC | PRN
  Administered 2020-08-17 – 2020-08-18 (×4): 5 mg via ORAL
  Filled 2020-08-17 (×4): qty 1

## 2020-08-17 MED ORDER — RIVAROXABAN 15 MG PO TABS: 15.0000 mg | ORAL_TABLET | Freq: Every day | ORAL | Status: DC

## 2020-08-17 MED ORDER — MESALAMINE ER 250 MG PO CPCR
1500.0000 mg | ORAL_CAPSULE | Freq: Every day | ORAL | Status: DC
  Administered 2020-08-17 – 2020-08-23 (×7): 1500 mg via ORAL
  Filled 2020-08-17 (×7): qty 6

## 2020-08-17 MED ORDER — ACETAMINOPHEN 500 MG PO TABS
500.0000 mg | ORAL_TABLET | Freq: Four times a day (QID) | ORAL | Status: DC | PRN
  Administered 2020-08-17 – 2020-08-19 (×4): 1000 mg via ORAL
  Filled 2020-08-17 (×4): qty 2

## 2020-08-17 MED ORDER — METOPROLOL TARTRATE 100 MG PO TABS: 100.0000 mg | ORAL_TABLET | Freq: Two times a day (BID) | ORAL | Status: DC

## 2020-08-17 MED ORDER — ATORVASTATIN CALCIUM 10 MG PO TABS
20.0000 mg | ORAL_TABLET | Freq: Every day | ORAL | Status: DC
  Administered 2020-08-17 – 2020-08-23 (×7): 20 mg via ORAL
  Filled 2020-08-17 (×8): qty 2

## 2020-08-17 MED ORDER — TAMOXIFEN CITRATE 10 MG PO TABS
20.0000 mg | ORAL_TABLET | Freq: Every day | ORAL | Status: DC
  Administered 2020-08-17 – 2020-08-20 (×4): 20 mg via ORAL
  Filled 2020-08-17 (×4): qty 2

## 2020-08-17 NOTE — Progress Notes (Signed)
°   08/17/20 2221  Assess: MEWS Score  Temp (!) 97.5 F (36.4 C)  BP 116/74  Pulse Rate (!) 113  Assess: MEWS Score  MEWS Temp 0  MEWS Systolic 0  MEWS Pulse 2  MEWS RR 0  MEWS LOC 0  MEWS Score 2  MEWS Score Color Yellow  Assess: if the MEWS score is Yellow or Red  Were vital signs taken at a resting state? Yes  Focused Assessment No change from prior assessment  Early Detection of Sepsis Score *See Row Information* Low  MEWS guidelines implemented *See Row Information* No, previously yellow, continue vital signs every 4 hours  Document  Patient Outcome Stabilized after interventions  Progress note created (see row info) Yes

## 2020-08-17 NOTE — NC FL2 (Signed)
[place] MEDICAID FL2 LEVEL OF CARE SCREENING TOOL     IDENTIFICATION  Patient Name: Michele Green Birthdate: 1941/02/28 Sex: female Admission Date (Current Location): 08/16/2020  [facility] and [place] Number:  Herbalist and Address:  [facility], [place] [address] [place], [place] [address]      Provider Number:    Attending Physician Name and Address:  Flora Lipps, MD  Relative Name and Phone Number:  Seward Meth [phone]    Current Level of Care: SNF Recommended Level of Care: [place] Prior Approval Number:    Date Approved/Denied:   PASRR Number: [id number]  Discharge Plan: SNF    Current Diagnoses: Patient Active Problem List   Diagnosis Date Noted  . Spinal fracture of T7 vertebra ([place]) 08/17/2020  . Atrial fibrillation with RVR ([place]) 08/16/2020  . Long term (current) use of anticoagulants 08/28/2018  . Atypical atrial flutter ([place]) 08/14/2018  . 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 S/P Maze operation for atrial fibrillation 08/10/2018  . Chronic diastolic HF (heart failure) ([place]) 06/20/2018  . Osteoporosis 09/29/2017  . Loosening of hardware in spine ([place]) 08/23/2017  . Spinal stenosis of lumbar region 08/11/2016  . Unspecified cord compression ([place]) 08/11/2016  . Unstable burst fracture of third lumbar vertebra ([place]) 08/11/2016  . Genetic testing 03/17/2016  . Family history of breast cancer   . Breast cancer of upper-outer quadrant of left female breast ([place]) 02/02/2016  . On amiodarone therapy 09/09/2015  . On continuous oral anticoagulation 08/22/2015  . Persistent atrial fibrillation ([place]) 08/22/2015  . Essential hypertension 11/07/2013  . Mitral regurgitation 11/07/2013    Orientation RESPIRATION BLADDER Height & Weight     Time, Self, Situation, Place  Normal Continent Weight: 123 lb 7.3 oz (56 kg) Height:  5' 4"  (162.6 cm)  BEHAVIORAL SYMPTOMS/MOOD  NEUROLOGICAL BOWEL NUTRITION STATUS      Continent    AMBULATORY STATUS COMMUNICATION OF NEEDS Skin   Limited Assist Verbally Normal                       Personal Care Assistance Level of Assistance  Bathing, Feeding, Dressing Bathing Assistance: Limited assistance Feeding assistance: Independent Dressing Assistance: Limited assistance     Functional Limitations Info  Sight, Hearing, Speech Sight Info: Adequate Hearing Info: Adequate Speech Info: Adequate    SPECIAL CARE FACTORS FREQUENCY  PT (By licensed PT), OT (By licensed OT)     PT Frequency: 5x per week OT Frequency: 5x per week            Contractures Contractures Info: Not present    Additional Factors Info  Code Status Code Status Info: full code             Current Medications (08/17/2020):  This is the current hospital active medication list Current Facility-Administered Medications  Medication Dose Route Frequency Provider Last Rate Last Admin  . acetaminophen (TYLENOL) tablet 500-1,000 mg  500-1,000 mg Oral Q6H PRN Irene Pap N, DO   1,000 mg at 08/17/20 1315  . allopurinol (ZYLOPRIM) tablet 200 mg  200 mg Oral QPM Hall, Carole N, DO      . atorvastatin (LIPITOR) tablet 20 mg  20 mg Oral Daily [place], Archie Patten N, DO   20 mg at 08/17/20 0843  . enoxaparin (LOVENOX) injection 40 mg  40 mg Subcutaneous Q24H Hall, Carole N, DO   40 mg at  08/17/20 0844  . furosemide (LASIX) tablet 20 mg  20 mg Oral Daily PRN Irene Pap N, DO      . HYDROmorphone (DILAUDID) injection 0.5 mg  0.5 mg Intravenous Q4H PRN Irene Pap N, DO   0.5 mg at 08/17/20 0328  . mesalamine (PENTASA) CR capsule 1,500 mg  1,500 mg Oral Daily Irene Pap N, DO   1,500 mg at 08/17/20 0845  . metoprolol tartrate (LOPRESSOR) tablet 50 mg  50 mg Oral BID Irene Pap N, DO   50 mg at 08/17/20 0844  . oxyCODONE (Oxy IR/ROXICODONE) immediate release tablet 5 mg  5 mg Oral Q3H PRN Irene Pap N, DO   5 mg at 08/17/20 1150  . potassium  chloride SA (KLOR-CON) CR tablet 20 mEq  20 mEq Oral Daily Irene Pap N, DO   20 mEq at 08/17/20 0844  . senna-docusate (Senokot-S) tablet 2 tablet  2 tablet Oral BID Irene Pap N, DO   2 tablet at 08/17/20 0844  . tamoxifen (NOLVADEX) tablet 20 mg  20 mg Oral Daily Irene Pap N, DO   20 mg at 08/17/20 0845     Discharge Medications: Please see discharge summary for a list of discharge medications.  Relevant Imaging Results:  Relevant Lab Results:   Additional Information SSN: [id number]  Elliot Gurney [place], [place]

## 2020-08-17 NOTE — H&P (Addendum)
History and Physical  Michele Green ZOX:[id number] DOB: [birth year] DOA: 08/16/2020  Referring physician: Dr. Myna Hidalgo  PCP: [name], MD  Outpatient Specialists: Cardiology Patient coming from: Home  Chief Complaint: Severe upper back pain after rolling in bed.  HPI: Michele Green is a 79 y.o. female with medical history significant for breast cancer, osteoporosis, persistent A. fib on Xarelto, essential hypertension, mitral valve repair, coronary artery disease status post CABG, chronic diastolic CHF who presented to [facility] ED due to severe upper back pain after rolling over in bed the night prior to her presentation.  Denies any trauma.  Has had intermittent nausea at home, no dysuria or abdominal pain.  No chest pain.  She is found to have an acute or subacute T7 compression fracture on MRI.  Normal cord signal intensity, no cord lesions or syrinx.  Mild paraspinal hematoma at T7.  This is complicated by A. fib with RVR, mild pulmonary edema on chest x-ray, and new hypoxia for which she was transferred to [facility] for further evaluation and management.   Neurosurgery recommended outpatient follow-up for T7 fracture.  Currently RVR is controlled on Cardizem drip.  Troponin S is mildly elevated, peaked at 27.  She is alert and oriented x3, she denies any anginal symptoms.  TRH was asked to admit.    ED Course: Direct transfer from [facility] to [facility].  Review of Systems: Review of systems as noted in the HPI. All other systems reviewed and are negative.   Past Medical History:  Diagnosis Date  . Arrhythmia   . Atypical atrial flutter ([place]) 08/14/2018   Post-operative  . Breast cancer of upper-outer quadrant of left female breast ([place]) 02/02/2016  . CAD (coronary artery disease) 06/20/2018   LHC 7/19: pLAD 65/90, oD1 90, mLCx 85, OM2 50, oRCA 30, EF 50-55 >> s/p CABG in 9/19 (L-LAD)  . Colon polyp 02/2012  . Dupuytren contracture    bilateral hands  . Family history of breast  cancer   . Hyperlipidemia   . Hypertension   . Mitral regurgitation 11/07/2013   Echo 7/19: Mild LVH, EF 60-65, no RWMA, mod ot severe MR, massive LAE, PASP 33 // TEE 7/19:  Mild conc LVH, EF 60-65, no RWMA, severe MR with mild post leaflet prolapse, massive // s/p MV repair 07/2018  . On continuous oral anticoagulation 08/22/2015   Started on Xarelto 08/12/2015   . Osteopenia   . Persistent atrial fibrillation ([place]) 08/22/2015   Started late August or early September 2016 // s/p Maze procedure 07/2018  . Personal history of radiation therapy   . Radiation Therapy 04/21/16-05/19/16   left breast 47.72 Gy, boosted to 10 Gy  . 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 S/P Maze operation for atrial fibrillation 08/10/2018   Complete bilateral atrial lesion set using bipolar radiofrequency and cryothermy ablation with clipping of LA appendage  . S/P MVR (mitral valve repair) 08/10/2018   Complex valvuloplasty including Gore-tex neochord placement x8, Plication of Lateral Commissure and 76m Sorin Memo 4D Ring Annuloplasty SN# [id number] . Ulcerative colitis ([place]   . Wears glasses    Past Surgical History:  Procedure Laterality Date  . BIOPSY  07/14/2020   Procedure: BIOPSY;  Surgeon: KRonnette Juniper MD;  Location: [facility] ENDOSCOPY;  Service: Gastroenterology;;  . BREAST BIOPSY Left 01/28/2016  . BREAST LUMPECTOMY Left 02/23/2016  . BREAST LUMPECTOMY WITH RADIOACTIVE SEED LOCALIZATION Left 02/23/2016   Procedure: BREAST LUMPECTOMY WITH RADIOACTIVE  SEED LOCALIZATION;  Surgeon: [name], MD;  Location: [place];  Service: General;  Laterality: Left;  . CARDIAC CATHETERIZATION    . CARDIOVERSION N/A 10/02/2015   Procedure: CARDIOVERSION;  Surgeon: Jerline Pain, MD;  Location: [facility] ENDOSCOPY;  Service: Cardiovascular;  Laterality: N/A;  . CARDIOVERSION N/A 10/05/2018   Procedure: CARDIOVERSION;  Surgeon: Fay Records, MD;  Location: [facility] ENDOSCOPY;  Service: Cardiovascular;  Laterality:  N/A;  . CARDIOVERSION N/A 11/05/2019   Procedure: CARDIOVERSION;  Surgeon: Dorothy Spark, MD;  Location: [place];  Service: Cardiovascular;  Laterality: N/A;  . CLIPPING OF ATRIAL APPENDAGE  08/10/2018   Procedure: CLIPPING OF ATRIAL APPENDAGE;  Surgeon: Rexene Alberts, MD;  Location: [place];  Service: Open Heart Surgery;;  . COLONOSCOPY    . COLONOSCOPY WITH PROPOFOL N/A 07/14/2020   Procedure: COLONOSCOPY WITH PROPOFOL;  Surgeon: Ronnette Juniper, MD;  Location: [facility]OS[facility];  Service: Gastroenterology;  Laterality: N/A;  . CORONARY ARTERY BYPASS GRAFT N/A 08/10/2018   Procedure: CORONARY ARTERY BYPASS GRAFTING (CABG) x 1, LIMA-LAD,  USING LEFT INTERNAL MAMMARY ARTERY. HARVESTED RIGHT GREATER SAPHENOUS VEIN ENDOSCOPICALLY;  Surgeon: Rexene Alberts, MD;  Location: [place];  Service: Open Heart Surgery;  Laterality: N/A;  . CYSTOSCOPY W/ RETROGRADES Bilateral 06/20/2019   Procedure: CYSTOSCOPY WITH RETROGRADE PYELOGRAM;  Surgeon: Ardis Hughs, MD;  Location: [facility];  Service: Urology;  Laterality: Bilateral;  . CYSTOSCOPY WITH HYDRODISTENSION AND BIOPSY N/A 06/20/2019   Procedure: CYSTOSCOPY BLADDER  BIOPSY WITH FULGERATION;  Surgeon: Ardis Hughs, MD;  Location: [facility];  Service: Urology;  Laterality: N/A;  . DUPUYTREN CONTRACTURE RELEASE  2001   leftx2  . [place]   right  . DUPUYTREN CONTRACTURE RELEASE Right 05/02/2014   Procedure: EXCISION DUPUYTRENS RIGHT PALMAR/SMALL ;  Surgeon: Cammie Sickle, MD;  Location: [place];  Service: Orthopedics;  Laterality: Right;  . HEMOSTASIS CLIP PLACEMENT  07/14/2020   Procedure: HEMOSTASIS CLIP PLACEMENT;  Surgeon: Ronnette Juniper, MD;  Location: [facility]OS[facility];  Service: Gastroenterology;;  . MAZE N/A 08/10/2018   Procedure: MAZE;  Surgeon: Rexene Alberts, MD;  Location: [place];  Service: Open Heart Surgery;  Laterality: N/A;  . MITRAL VALVE REPAIR N/A  08/10/2018   Procedure: MITRAL VALVE REPAIR (MVR);  Surgeon: Rexene Alberts, MD;  Location: [place];  Service: Open Heart Surgery;  Laterality: N/A;  glutaraldehyde  . POLYPECTOMY  07/14/2020   Procedure: POLYPECTOMY;  Surgeon: Ronnette Juniper, MD;  Location: [facility];  Service: Gastroenterology;;  . RIGHT/LEFT HEART CATH AND CORONARY ANGIOGRAPHY N/A 06/20/2018   Procedure: RIGHT/LEFT HEART CATH AND CORONARY ANGIOGRAPHY;  Surgeon: [name], MD;  Location: [place] CV LAB;  Service: Cardiovascular;  Laterality: N/A;  . TEE WITHOUT CARDIOVERSION N/A 06/20/2018   Procedure: TRANSESOPHAGEAL ECHOCARDIOGRAM (TEE);  Surgeon: Pixie Casino, MD;  Location[facility] ENDOSCOPY;  Service: Cardiovascular;  Laterality: N/A;  . TEE WITHOUT CARDIOVERSION N/A 08/10/2018   Procedure: TRANSESOPHAGEAL ECHOCARDIOGRAM (TEE);  Surgeon: Rexene Alberts, MD;  Location: [place];  Service: Open Heart Surgery;  Laterality: N/A;  . TONSILLECTOMY  age 38    Social History:  reports that she quit smoking about 27 years ago. Her smoking use included cigarettes. She has a 20.00 pack-year smoking history. She has never used smokeless tobacco. She reports current alcohol use of about 3.0 standard drinks of alcohol per week. She reports that she does not use  drugs.   Allergies  Allergen Reactions  . Shellfish Allergy Itching    Family History  Problem Relation Age of Onset  . Cirrhosis Mother   . Breast cancer Mother 23  . Heart failure Father   . Heart attack Father   . Breast cancer Sister        early 41's  . Kidney Stones Sister        loss of kidney due to stones  . Osteoporosis Neg Hx       Prior to Admission medications   Medication Sig Start Date End Date Taking? Authorizing Provider  acetaminophen (TYLENOL) 500 MG tablet Take 500-1,000 mg by mouth every 6 (six) hours as needed for moderate pain or headache.    [provider]  allopurinol (ZYLOPRIM) 100 MG tablet Take 200 mg by mouth every  evening.  06/21/13   [provider]  amoxicillin (AMOXIL) 500 MG tablet Take 4 tablets (2,000 mg total) by mouth as needed (TO BE USED FOR DENTAL PROCEDURE). Take 4 tabs 30-60 minutes before dental cleaning/procedure 07/29/20   [name], MD  atorvastatin (LIPITOR) 20 MG tablet TAKE 1 TABLET BY MOUTH EVERY DAY 04/07/20   Camnitz, Ocie Doyne, MD  furosemide (LASIX) 20 MG tablet Take 20 mg by mouth daily as needed (for swelling/fluid retention.).     [provider]  HYDROcodone-acetaminophen (NORCO/VICODIN) 5-325 MG tablet Take 1 tablet by mouth every 4 (four) hours as needed. 08/16/20   Isla Pence, MD  KLOR-CON M20 20 MEQ tablet TAKE 1 TABLET BY MOUTH EVERY DAY 05/21/19   [name], MD  mesalamine (APRISO) 0.375 g 24 hr capsule Take 1.5 g by mouth daily.  06/01/19   [provider]  metoprolol tartrate (LOPRESSOR) 100 MG tablet TAKE 1 TABLET BY MOUTH TWICE A DAY 03/10/20   [name], MD  phenazopyridine (PYRIDIUM) 200 MG tablet Take 1 tablet (200 mg total) by mouth 3 (three) times daily as needed for pain. 06/20/19   Ardis Hughs, MD  predniSONE (STERAPRED UNI-PAK 21 TAB) 10 MG (21) TBPK tablet Take 20 mg by mouth daily. Take as directed - for C-Diff     [provider]  tamoxifen (NOLVADEX) 20 MG tablet TAKE 1 TABLET BY MOUTH EVERY DAY 03/03/20   Truitt Merle, MD  traMADol (ULTRAM) 50 MG tablet Take 1-2 tablets (50-100 mg total) by mouth every 6 (six) hours as needed for moderate pain. 06/20/19   Ardis Hughs, MD  XARELTO 15 MG TABS tablet TAKE 1 TABLET BY MOUTH  DAILY WITH SUPPER 03/24/20   [name], MD    Physical Exam: BP 114/66 (BP Location: Right Arm)   Pulse (!) 107   Temp 97.6 F (36.4 C)   Resp 18   Ht 5' 4"  (1.626 m)   Wt 56 kg   SpO2 99%   BMI 21.19 kg/m   . General: 79 y.o. year-old female well developed well nourished in no acute distress.  Alert and oriented x3. . Cardiovascular: Irregular rate and rhythm with no  rubs or gallops.  No thyromegaly or JVD noted.  No lower extremity edema. 2/4 pulses in all 4 extremities. [name] Respiratory: Mild rales at bases no wheezing noted.  Poor inspiratory effort.   . Abdomen: Soft nontender nondistended with normal bowel sounds x4 quadrants. . Muskuloskeletal: No cyanosis, clubbing or edema noted bilaterally . Neuro: CN II-XII intact, strength, sensation, reflexes . Skin: Bruising noted in upper extremities. [name] Psychiatry:  Judgement and insight appear normal. Mood is appropriate for condition and setting          Labs on Admission:  Basic Metabolic Panel: Recent Labs  Lab 08/16/20 1452  NA 138  K 4.2  CL 103  CO2 22  GLUCOSE 111*  BUN 20  CREATININE 0.82  CALCIUM 8.9   Liver Function Tests: Recent Labs  Lab 08/16/20 1452  AST 34  ALT 37  ALKPHOS 44  BILITOT 0.7  PROT 7.1  ALBUMIN 3.7   No results for input(s): LIPASE, AMYLASE in the last 168 hours. No results for input(s): AMMONIA in the last 168 hours. CBC: Recent Labs  Lab 08/16/20 1452  WBC 16.4*  NEUTROABS 13.6*  HGB 13.7  HCT 43.6  MCV 105.1*  PLT 157   Cardiac Enzymes: No results for input(s): CKTOTAL, CKMB, CKMBINDEX, TROPONINI in the last 168 hours.  BNP (last 3 results) Recent Labs    05/17/20 1230 08/16/20 1452  BNP 237.4* 482.0*    ProBNP (last 3 results) No results for input(s): PROBNP in the last 8760 hours.  CBG: Recent Labs  Lab 08/17/20 0100  GLUCAP 92    Radiological Exams on Admission: DG Chest 2 View  Result Date: 08/16/2020 CLINICAL DATA:  Hypoxia.  Mid back pain. EXAM: CHEST - 2 VIEW COMPARISON:  Radiograph 07/22/2020 FINDINGS: Lower lung volumes from prior exam. Post median sternotomy. CABG with left atrial clipping. Prosthetic cardiac valve. Similar cardiomegaly to prior exam. Unchanged mediastinal contours. Mild increase in left basilar atelectasis from prior exam. Bronchovascular crowding versus vascular congestion. No pneumothorax or pleural  effusion. Midthoracic vertebral compression which was assessed on radiograph and MRI earlier today. IMPRESSION: 1. Lower lung volumes from prior exam with bronchovascular crowding versus vascular congestion. 2. Mild increase in left basilar atelectasis. Electronically Signed   By: Keith Rake M.D.   On: 08/16/2020 21:20   DG Thoracic Spine W/Swimmers  Result Date: 08/16/2020 CLINICAL DATA:  79 year old female presenting with upper back pain. EXAM: THORACIC SPINE - 3 VIEWS COMPARISON:  Chest radiograph from May 31 21, CT abdomen pelvis from 05/22/2020, and chest radiograph from 05/17/2020 FINDINGS: Interval anterior wedge compression deformity of the T7 vertebral body with associated focal kyphosis at this level. There is approximately 75% anterior vertebral body height loss. Unchanged chronic compression fractures of the L1-L2 vertebral bodies. Diffuse osteopenia. Similar appearing thoracic and abdominal aortic atherosclerotic calcifications. Median sternotomy wires in place. Left atrial appendage clip and mitral prosthesis in place. IMPRESSION: Acute compression fracture of the T7 vertebral body with approximately 75% anterior vertebral body height loss. Consider thoracic spine MRI for further characterization. Diffuse osteopenia. Aortic Atherosclerosis (ICD10-I70.0). These results were called by telephone at the time of interpretation on 08/16/2020 at 2:28 pm to provider [facility] , who verbally acknowledged these results. Electronically Signed   By: Ruthann Cancer MD   On: 08/16/2020 14:29   MR THORACIC SPINE WO CONTRAST  Result Date: 08/16/2020 CLINICAL DATA:  Mid back pain for 3 days. EXAM: MRI THORACIC SPINE WITHOUT CONTRAST TECHNIQUE: Multiplanar, multisequence MR imaging of the thoracic spine was performed. No intravenous contrast was administered. COMPARISON:  Thoracic spine films, same date. FINDINGS: Alignment: Normal overall alignment of the thoracic vertebral bodies. Slightly exaggerated  thoracic kyphosis due to the significant T7 compression fracture. Vertebrae: T7 compression fracture appears acute or subacute. The vertebral body is significantly compressed anteriorly approximately 75%. There is mild retropulsion involving the posteroinferior aspect of the vertebral body but no significant canal  stenosis. Remote compression fractures noted in the lumbar spine. The other thoracic vertebral bodies are maintained. Cord:  Normal cord signal intensity.  No cord lesions or syrinx. Paraspinal and other soft tissues: Mild paraspinal hematoma at T7. There also small pleural effusions and bibasilar atelectasis. Disc levels: No significant thoracic disc protrusions, spinal or foraminal stenosis. Mild retropulsion at T7 with mild canal narrowing at T7-8 but no significant canal stenosis or cord compression. IMPRESSION: 1. Acute or subacute T7 compression fracture with mild retropulsion involving the posteroinferior aspect of the vertebral body but no significant canal stenosis. 2. Remote compression fractures of the lumbar spine. 3. No significant thoracic disc protrusions, spinal or foraminal stenosis. Electronically Signed   By: Marijo Sanes M.D.   On: 08/16/2020 18:02    EKG: I independently viewed the EKG done and my findings are as followed: A. fib rate of 127.  Assessment/Plan Present on Admission: . Atrial fibrillation with RVR (HCC)  Active Problems:   Atrial fibrillation with RVR (HCC)  Persistent A. fib with RVR, currently on diltiazem drip Presented at [facility] ED with A. fib RVR, was started on diltiazem drip, titrating down Rate is now controlled on diltiazem drip, wean off as tolerated. Resume home metoprolol, lower dose 50 mg twice daily to avoid hypotension Up-titrate p.o. metoprolol to home dose of 100 mg twice daily when BP is stable. Home diltiazem has been discontinued by her cardiologist outpatient. On home Xarelto for CVA prevention, held due to paraspinal hematoma at T7.   Obtain neurosurgery and cardiology input on when to restart DOAC.  Newly diagnosed acute/subacute T7 compression fracture/Mild paraspinal hematoma at T7 No cord involvement She is on DOAC, would likely require repeat imaging to monitor paraspinal hematoma if back on DOAC. Defer to neurosurgery and cardiology to restart anticoagulant, obtain both specialists input. TLSO brace Pain control with IV dilaudid as needed for severe pain, oxycodone as needed for moderate pain, Tylenol as needed for mild pain.  Added bowel regimen to avoid opioid-induced constipation.  Elevated troponin, likely demand ischemia in the setting of A. fib with RVR Troponin S mildly elevated, 27 She denies any anginal symptoms. No evidence of acute ischemia on twelve-lead EKG Cycle troponin x2  Acute hypoxic respiratory failure likely secondary to mild pulmonary edema and bibasilar atelectasis Not on oxygen supplementation at baseline O2 saturation 88% on room air Currently on 2 L to maintain O2 saturation greater than 90% Personally reviewed chest x-ray done on admission which showed bibasilar atelectasis and mild increase in pulmonary vascularity suggestive of mild pulmonary edema. Resume home Lasix as needed Start incentive spirometer  SIRS/Leukocytosis/neutrophilia, rule out active infective process. Presented with WBC 16.4K, heart rate 126. No evidence of pneumonia on chest x-ray, personally reviewed UA and urine culture are pending, she has a history of urinary tract infection Start Rocephin if UA is positive for pyuria and follow urine culture to narrow down antibiotics.  Intermittent Nausea prior to admission Vomited x1 in the ED at [place] out UTI, UA/Ucx pending Bowel regimen in place for constipation IV anti-emetics  Chronic diastolic CHF Euvolemic on exam. Last 2D echo done on 09/05/2019 showed LVEF 60-65%, left atrial size was severely dilated. BNP 482 from 237 previously Resume home  Beta-blocker and home PRN lasix Monitor strict I's and O's and daily weight  Coronary artery disease status post CABG Denies any anginal symptoms. Not on home antiplatelet, on Xarelto for persistent A. Fib, held as stated above. Resume home Lipitor  Hyperlipidemia/gout/breast cancer  Stable Resume home regimen    DVT prophylaxis: SCDs, SQ Lovenox daily (she is on Tamoxifen which increases her risks of VTE) Xarelto held due to paraspinal hematoma at T7.  Defer to neurosurgery and cardiology to restart.  Code Status: Full code, as stated by the patient herself.  Family Communication: None at bedside  Disposition Plan: Admit to cardiac telemetry  Consults called: Consult neurosurgery and cardiology regarding her DOAC.  Admission status: Observation status   Status is: Observation    Dispo: The patient is from: Home.               Anticipated d/c is to: Home.               Anticipated d/c date is: 08/18/2020.               Patient currently not medically stable for discharge due to ongoing management of A. fib with RVR, and newly diagnosed compression fracture.       Kayleen Memos MD [facility] Pager [phone]  If 7PM-7AM, please contact night-coverage [email or web address] Password [email or web address]  08/17/2020, 2:28 AM

## 2020-08-17 NOTE — Evaluation (Signed)
Physical Therapy Evaluation Patient Details Name: Michele Green MRN: [id number] DOB: Jun 10, 1941 Today's Date: 08/17/2020   History of Present Illness  Pt is an 79 y.o. female admitted 08/16/20 with svere upper back pain after rolling over in bed the night before admission. MRI with acute or subacute T7 compression fx, mild paraspinal hematoma. Complicated by afib with RVR, mild pulmonary edema, new hypoxia. Neurosx recommending outpatient follow up for T7 fx. PMH includes breast CA, osteoporosis, persistent afib on Xarelto, HTN, MVR, CAD s/p CABG, CHF.    Clinical Impression  Pt presents with an overall decrease in functional mobility secondary to above. PTA, pt mod indep with rollator, lives alone, drives. Today, pt reports feeling "drunk" and "scattered" which she attributes to "too much pain medicine." Pt with intermittent confusion, decreased attention, and poor awareness, limiting ability to process education regarding current condition, back precautions and brace wear. Pt requiring minA for transfers and taking steps with RW; limited by generalized weakness, pain and impaired balance; at high risk for falls. Pt would benefit from continued acute PT services to maximize functional mobility and independence prior to d/c with SNF-level therapies. Pt contradicting herself saying, "I need rehab," then stating she has to go home although family cannot provide assist.     Follow Up Recommendations SNF;Supervision/Assistance - 24 hour    Equipment Recommendations   (TBD)    Recommendations for Other Services OT consult     Precautions / Restrictions Precautions Precautions: Back;Other (comment) Precaution Booklet Issued: No Precaution Comments: Watch HR/O2 Required Braces or Orthoses: Spinal Brace Spinal Brace: Thoracolumbosacral orthotic;Applied in sitting position Restrictions Weight Bearing Restrictions: No      Mobility  Bed Mobility Overal bed mobility: Needs Assistance Bed  Mobility: Rolling;Sidelying to Sit Rolling: Min assist Sidelying to sit: Min assist;HOB elevated       General bed mobility comments: Educ on and cues for log roll technique; heavy reliance on bed rail, minA for trunk elevation  Transfers Overall transfer level: Needs assistance Equipment used: Rolling walker (2 wheeled) Transfers: Sit to/from Stand Sit to Stand: Min assist         General transfer comment: Cues for hand placement. Pt stating, "I can't do anything," but ultimately able to stand multiple times from bed and recliner with minA for trunk elevation and balance; repeated cues for sequencing and hand placement  Ambulation/Gait Ambulation/Gait assistance: Min assist Gait Distance (Feet): 2 Feet Assistive device: Rolling walker (2 wheeled) Gait Pattern/deviations: Step-to pattern;Trunk flexed;Leaning posteriorly;Antalgic Gait velocity: Decreased   General Gait Details: Slow, unsteady gait with RW and minA for stability; pt reports "my legs will give out" - slight instability noted, but no buckling. Further mobility limited by c/o pain, dizziness, fatigue  Stairs            Wheelchair Mobility    Modified Rankin (Stroke Patients Only)       Balance Overall balance assessment: Needs assistance   Sitting balance-Leahy Scale: Fair       Standing balance-Leahy Scale: Poor Standing balance comment: Reliant on UE support or external assist                             Pertinent Vitals/Pain Pain Assessment: Faces Faces Pain Scale: Hurts a little bit Pain Location: Back Pain Descriptors / Indicators: Discomfort;Guarding Pain Intervention(s): Monitored during session    Home Living Family/patient expects to be discharged to:: Private residence Living Arrangements: Alone Available Help at Discharge:  Family;Available PRN/intermittently Type of Home: House Home Access: Stairs to enter     Home Layout: Two level;Able to live on main level with  bedroom/bathroom Home Equipment: Walker - 2 wheels;Grab bars - tub/shower;Shower seat;Walker - 4 wheels Additional Comments: Sister lives nearby but pt reports not able to provide assist    Prior Function Level of Independence: Independent with assistive device(s)         Comments: Lives alone. Mod indep with intermittent use of rollator     Hand Dominance        Extremity/Trunk Assessment   Upper Extremity Assessment Upper Extremity Assessment: Generalized weakness    Lower Extremity Assessment Lower Extremity Assessment: Generalized weakness       Communication   Communication: No difficulties  Cognition Arousal/Alertness: Awake/alert Behavior During Therapy: Anxious Overall Cognitive Status: Impaired/Different from baseline Area of Impairment: Orientation;Attention;Memory;Following commands;Safety/judgement;Awareness;Problem solving                 Orientation Level: Disoriented to;Place Current Attention Level: Sustained Memory: Decreased short-term memory;Decreased recall of precautions Following Commands: Follows one step commands inconsistently;Follows one step commands with increased time Safety/Judgement: Decreased awareness of safety;Decreased awareness of deficits Awareness: Intellectual;Emergent Problem Solving: Requires verbal cues General Comments: Pt reports feeling "drunk" and "scatterred" which she attributes to "too much pain medicine." Tangential with speech, asking unrelated questions and switching through topics, requiring frequent cues to reorient to current conversation, poor attention. Moments of confusion thinking HOB angle reader was showing her purewick was full, stating a cat was on the roof, etc. Poor awareness when discussing d/c plans      General Comments General comments (skin integrity, edema, etc.): Poor attention with no teach back regarding brace wear and back precautions - will need to reeducate on this. SpO2 down to 87% on  RA, 99% on 1L; BP 109/76, HR up to 128 (pt reports, "I feel like I'm in afib")    Exercises     Assessment/Plan    PT Assessment Patient needs continued PT services  PT Problem List Decreased strength;Decreased activity tolerance;Decreased balance;Decreased mobility;Decreased cognition;Decreased knowledge of use of DME;Decreased safety awareness;Decreased knowledge of precautions;Cardiopulmonary status limiting activity;Pain       PT Treatment Interventions DME instruction;Gait training;Stair training;Functional mobility training;Therapeutic activities;Therapeutic exercise;Balance training;Cognitive remediation;Patient/family education    PT Goals (Current goals can be found in the Care Plan section)  Acute Rehab PT Goals Patient Stated Goal: "I want rehab... my sister won't be able to help me... but I have to go home for those shots in my stomach..." PT Goal Formulation: With patient Time For Goal Achievement: 08/31/20 Potential to Achieve Goals: Fair    Frequency Min 5X/week   Barriers to discharge Decreased caregiver support      Co-evaluation               AM-PAC PT "6 Clicks" Mobility  Outcome Measure Help needed turning from your back to your side while in a flat bed without using bedrails?: A Little Help needed moving from lying on your back to sitting on the side of a flat bed without using bedrails?: A Little Help needed moving to and from a bed to a chair (including a wheelchair)?: A Little Help needed standing up from a chair using your arms (e.g., wheelchair or bedside chair)?: A Little Help needed to walk in hospital room?: A Little Help needed climbing 3-5 steps with a railing? : A Lot 6 Click Score: 17    End of Session Equipment  Utilized During Treatment: Gait belt Activity Tolerance: Patient tolerated treatment well;Patient limited by pain;Patient limited by fatigue Patient left: in chair;with call bell/phone within reach;with chair alarm set Nurse  Communication: Mobility status PT Visit Diagnosis: Other abnormalities of gait and mobility (R26.89);Muscle weakness (generalized) (M62.81)    Time: 8682-5749 PT Time Calculation (min) (ACUTE ONLY): 24 min   Charges:   PT Evaluation $PT Eval Moderate Complexity: 1 Mod PT Treatments $Therapeutic Activity: 8-22 mins      Mabeline Caras, PT, DPT Acute Rehabilitation Services  Pager [phone] Office [phone]  Derry Lory 08/17/2020, 9:16 AM

## 2020-08-17 NOTE — Progress Notes (Signed)
Pt son Lindamarie Maclachlan would like to stay updated on discharge care and to stay involved in discharge planning and SNF placement  Phone is [phone]

## 2020-08-17 NOTE — TOC Initial Note (Addendum)
Transition of Care [facility]) - Initial/Assessment Note    Patient Details  Name: Michele Green MRN: [id number] Date of Birth: 09-24-1941  Transition of [facility]) CM/SW Contact:    Elliot Gurney [place], Edith [place] Phone Number: [phone] 08/17/2020, 3:52 PM  Clinical Narrative:                 Pt is an 79 y.o. female admitted 08/16/20 with severe upper back pain after rolling over in bed the night before admission.  SNF recommendation discussed with patient. Patient agitated at first, stating thoughts that her son recommended SNF. Intermittent confusion observed. Patient was then able to discuss that she wanted to  go home but understands that she does not have any one to help provide care as she lives alone. Patient did state that she does have a home health aid coming out 2 hours in the morning and 2 hours in the evening but cannot afford any additional hours. Patient also has a sister and neighbor that check on her as well as an adult son that lives approximately 1.5 hours away. However no one to provide the daily consistency she needs. Patient agreeable to SNF but has declined fax out to [facility]. SNF process discussed, PASRR to be completed, FL2 to be faxed out for bed availability. Bed offers to be presented to patient for choice. Patient's sister and son called while at bedside. Voicemail left requesting a return call.  Letti Towell [address] LCSW Transition of Care [phone]   Expected Discharge Plan: Skilled Nursing Facility Barriers to Discharge: No Barriers Identified   Patient Goals and CMS Choice Patient states their goals for this hospitalization and ongoing recovery are:: "I want to go home"      Expected Discharge Plan and Services Expected Discharge Plan: [place] In-house Referral: Clinical Social Work     Living arrangements for the past 2 months: Single Family Home                                      Prior Living  Arrangements/Services Living arrangements for the past 2 months: Single Family Home Lives with:: Self Patient language and need for interpreter reviewed:: Yes Do you feel safe going back to the place where you live?: No   "I can go home but not right now, I would need someone to help me"  Need for Family Participation in Patient Care: Yes (Comment) Care giver support system in place?: No (comment) Current home services: Other (comment) (2 hours in the morning, 2 hours in the evening aid came from [facility])    Activities of Daily Living      Permission Sought/Granted   Permission granted to share information with : Yes, Verbal Permission Granted        Permission granted to share info w Relationship: Coralyn Helling     Emotional Assessment Appearance:: Appears stated age Attitude/Demeanor/Rapport: Engaged Affect (typically observed): Agitated Orientation: : Oriented to Self, Oriented to Place, Oriented to  Time Alcohol / Substance Use: Not Applicable Psych Involvement: No (comment)  Admission diagnosis:  Upper back pain [M54.9] Atrial fibrillation with RVR ([place]) [I48.91] Closed wedge compression fracture of T7 vertebra, initial encounter ([place]) [S22.060A] Spinal fracture of T7 vertebra ([place]) [S22.069A] Patient Active Problem List   Diagnosis Date Noted  . Spinal fracture of T7 vertebra ([place]) 08/17/2020  . Atrial fibrillation with RVR ([place]) 08/16/2020  . Long term (current)  use of anticoagulants 08/28/2018  . Atypical atrial flutter ([place]) 08/14/2018  . 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 S/P Maze operation for atrial fibrillation 08/10/2018  . Chronic diastolic HF (heart failure) ([place]) 06/20/2018  . Osteoporosis 09/29/2017  . Loosening of hardware in spine ([place]) 08/23/2017  . Spinal stenosis of lumbar region 08/11/2016  . Unspecified cord compression ([place]) 08/11/2016  . Unstable burst fracture of third lumbar vertebra ([place]) 08/11/2016   . Genetic testing 03/17/2016  . Family history of breast cancer   . Breast cancer of upper-outer quadrant of left female breast ([place]) 02/02/2016  . On amiodarone therapy 09/09/2015  . On continuous oral anticoagulation 08/22/2015  . Persistent atrial fibrillation ([place]) 08/22/2015  . Essential hypertension 11/07/2013  . Mitral regurgitation 11/07/2013   PCP:  [name], MD Pharmacy:   [id number] [place]Phone: [phone]: [phone] [address] [place]Phone: [phone]: [phone]    Social Determinants of Health (SDOH) Interventions    Readmission Risk Interventions No flowsheet data found.

## 2020-08-17 NOTE — Progress Notes (Signed)
Pt has had 2 episodes of increased heartrate with activity today .Pt increased to 160 and sustained for brief periods then returned to regular rate. During second episode pt increased to 160 for 5 minutes then sustained in 130-150 range afterward.  MD notified  Will continue to monitor

## 2020-08-17 NOTE — Progress Notes (Signed)
Same day note  Patient seen and examined at bedside.  Patient was admitted to the hospital for back pain, afib with rvr  At the time of my evaluation, patient complains of back pain even on slight movement of the back.  Physical examination reveals female not in obvious distress.  Tenderness on palpation of the lower thoracic spine.  Laboratory data and imaging was reviewed  Assessment and Plan.  Acute/subacute T7 compression fracture/Mild paraspinal hematoma at T7. Off anticoagulation. TSLO brace, analgesia.  Spoke with neurosurgery on-call, Dr. Marcello Moores.  He recommended off of anticoagulation for couple of days if patient is actively hurting.  No neurosurgical intervention planned.  Could benefit from IR to look into the subacute fracture.  She does have multiple old fractures.  Patient would from bisphosphonates since the patient is not on it and is on chronic steroids as well.  Acute hypoxic respiratory failure secondary to mild pulmonary edema and bibasilar atelectasis.  Oxygen was 88% on room air initially.  Continue Lasix.  Currently on 2 L of nasal cannula oxygen but  Demand ischemia.  Mild elevated troponin.  A. fib with RVR.  EKG unremarkable.  Patient will be seen by cardiology today.  Atrial fibrillation with RVR.  Initially required Cardizem drip.  On metoprolol.  Dose of metoprolol has been modified to 50 twice daily.  Cardizem has been discontinued as outpatient.  Xarelto on hold due to paraspinous hematoma.  Spoke with cardiology regarding anticoagulants atrial fibrillation and paraspinal hematoma.     Leukocytosis/SIRS.  We will continue to monitor.  Chronic diastolic congestive heart failure.  History of coronary artery disease status post CABG.  Last today echo on 09/05/2019 showed LV ejection fraction of 60 to 65% with left atrial dilatation.  Continue beta-blockers.  Intake and output charting.  Hyperlipidemia.  No Charge  Signed,  Delila Pereyra, MD [facility]

## 2020-08-17 NOTE — Progress Notes (Signed)
Neurosurgery  Reviewed patient's MRI.  79 yo F with multiple medical comorbidities including cardiac disease was found to have an acute T7 osteoporotic compression fracture.  As it is in the semi-rigid midthoracic spine, bracing is not likely needed.  If she continues to have severe pain, IR can be consulted for consideration of kyphoplasty.  If she is not felt to be a good candidate for this, a clamshell brace to be worn when out of bed may provide some palliation.  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  She likely would benefit from more aggressive osteoporosis treatments as an outpatient.  She can f/u in neurosurgery clinic in 2 weeks with an x-ray performed in clinic.

## 2020-08-17 NOTE — ED Notes (Signed)
Dr. Randal Buba and primary nurse  aware of elevated troponin level.

## 2020-08-17 NOTE — Progress Notes (Signed)
Orthopedic Tech Progress Note Patient Details:  Michele Green November 11, 1941 [id number]  Patient ID: Michele Green, female   DOB: 1941/11/22, 79 y.o.   MRN: [id number] Pt has brace according to rn.  Karolee Stamps 08/17/2020, 6:46 AM

## 2020-08-17 NOTE — Consult Note (Addendum)
Cardiology Consultation:   Patient ID: Michele Green MRN: [id number]; DOB: [birth year]  Admit date: 08/16/2020 Date of Consult: 08/17/2020  Primary Care Provider: [name], MD [facility] Cardiologist: Michele Grooms, MD  [place] Electrophysiologist:  Michele Meredith Leeds, MD    Patient Profile:   Michele Green is a 79 y.o. Green with a hx of CAD s/p CABG with LIMA-LAD and severe MR s/p repair September 2019, breast cancer, HTN, afib prior to open heart surgery, Maze procedure without atrial appendage ligation September 2019, recurrent Afib/flutter post surgery despite amiodarone therapy and cardioversion November 2019 with plan to rate control who is being seen today for the evaluation of anticoagulation in the setting of spinal hematoma at the request of Michele Green.  History of Present Illness:   Michele Green is followed by Dr. Tamala Green and Dr. Myrtie Green. Seen by Dr. Curt Green on 05/13/20 for atypical aflutter. Left atrium still severely dilated. She had cardioversion 11/05/2019 and unfortunately went back into  Aflutter now on rate control. She was in aflutter. dilt was increased to 218m for better rate control. Last seen 08/14/20 by Dr. STamala Julianreporting exertional fatigue with dizziness and weakness. Had some pedal edema however was on steroids. Also was having hypotension and dilt was discontinued. Lanoxin was added. Also on Lopressor 100 mg daily and lasix 20 mg daily  The last echo 08/2019 showed LVEF 60-65%, moderate LVH, moderately reduced RV function, severely dilated LA, MV repair with mild MR, mild to mod stenosis, MV peak gradient 19.46mg, mild TR.  [facility] for severe back pain after rolling over in bed. She did not hit her head. She was found to have an acute or subacute T7 compression fracture on MRI, normal cord signal intensity, mild paraspinal hematoma at T7. CXR showed mild pulmonary edema. Also noted to be in afib RVR  started on IV cardizem. Hs troponin peaked at 27. [facility] saw who recommend OP follow-up for T7 fracture.     Past Medical History:  Diagnosis Date  . Arrhythmia   . Atypical atrial flutter ([facility]/23/2019   Post-operative  . Breast cancer of upper-outer quadrant of left Green breast ([facility]3/13/2017  . CAD (coronary artery disease) 06/20/2018   LHC 7/19: pLAD 65/90, oD1 90, mLCx 85, OM2 50, oRCA 30, EF 50-55 >> s/p CABG in 9/19 (L-LAD)  . Colon polyp 02/2012  . Dupuytren contracture    bilateral hands  . Family history of breast cancer   . Hyperlipidemia   . Hypertension   . Mitral regurgitation 11/07/2013   Echo 7/19: Mild LVH, EF 60-65, no RWMA, mod ot severe MR, massive LAE, PASP 33 // TEE 7/19:  Mild conc LVH, EF 60-65, no RWMA, severe MR with mild post leaflet prolapse, massive // s/p MV repair 07/2018  . On continuous oral anticoagulation 08/22/2015   Started on Xarelto 08/12/2015   . Osteopenia   . Persistent atrial fibrillation ([facility]09/30/2016   Started late August or early September 2016 // s/p Maze procedure 07/2018  . Personal history of radiation therapy   . Radiation Therapy 04/21/16-05/19/16   left breast 47.72 Gy, boosted to 10 Gy  . 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 S/P Maze operation for atrial fibrillation 08/10/2018   Complete bilateral atrial lesion set using bipolar radiofrequency and cryothermy ablation with clipping of LA appendage  . S/P MVR (  mitral valve repair) 08/10/2018   Complex valvuloplasty including Gore-tex neochord placement x8, Plication of Lateral Commissure and 83m Sorin Memo 4D Ring Annuloplasty SN# [id number] . Ulcerative colitis (HD[place]   . Wears glasses     Past Surgical History:  Procedure Laterality Date  . BIOPSY  07/14/2020   Procedure: BIOPSY;  Surgeon: KRonnette Juniper MD;  [facility];  Service: Gastroenterology;;  . BREAST BIOPSY Left 01/28/2016  . BREAST LUMPECTOMY  Left 02/23/2016  . BREAST LUMPECTOMY WITH RADIOACTIVE SEED LOCALIZATION Left 02/23/2016   Procedure: BREAST LUMPECTOMY WITH RADIOACTIVE SEED LOCALIZATION;  Surgeon: BExcell Seltzer MD;  [place]  Service: General;  Laterality: Left;  . CARDIAC CATHETERIZATION    . CARDIOVERSION N/A 10/02/2015   Procedure: CARDIOVERSION;  Surgeon: MJerline Pain MD;  [facility];  Service: Cardiovascular;  Laterality: N/A;  . CARDIOVERSION N/A 10/05/2018   Procedure: CARDIOVERSION;  Surgeon: RFay Records MD;  [facility];  Service: Cardiovascular;  Laterality: N/A;  . CARDIOVERSION N/A 11/05/2019   Procedure: CARDIOVERSION;  Surgeon: NDorothy Spark MD;  [place]  Service: Cardiovascular;  Laterality: N/A;  . CLIPPING OF ATRIAL APPENDAGE  08/10/2018   Procedure: CLIPPING OF ATRIAL APPENDAGE;  Surgeon: ORexene Alberts MD;  [place]  Service: Open Heart Surgery;;  . COLONOSCOPY    . COLONOSCOPY WITH PROPOFOL N/A 07/14/2020   Procedure: COLONOSCOPY WITH PROPOFOL;  Surgeon: KRonnette Juniper MD;  [facility];  Service: Gastroenterology;  Laterality: N/A;  . CORONARY ARTERY BYPASS GRAFT N/A 08/10/2018   Procedure: CORONARY ARTERY BYPASS GRAFTING (CABG) x 1, LIMA-LAD,  USING LEFT INTERNAL MAMMARY ARTERY. HARVESTED RIGHT GREATER SAPHENOUS VEIN ENDOSCOPICALLY;  Surgeon: ORexene Alberts MD;  [place]  Service: Open Heart Surgery;  Laterality: N/A;  . CYSTOSCOPY W/ RETROGRADES Bilateral 06/20/2019   Procedure: CYSTOSCOPY WITH RETROGRADE PYELOGRAM;  Surgeon: HArdis Hughs MD;  [facility]  Service: Urology;  Laterality: Bilateral;  . CYSTOSCOPY WITH HYDRODISTENSION AND BIOPSY N/A 06/20/2019   Procedure: CYSTOSCOPY BLADDER  BIOPSY WITH FULGERATION;  Surgeon: HArdis Hughs MD;  [facility]  Service: Urology;  Laterality: N/A;  . DUPUYTREN CONTRACTURE RELEASE  2001    [place]x2  . [place]  [place]  . DUPUYTREN CONTRACTURE RELEASE Right 05/02/2014   Procedure: EXCISION DUPUYTRENS RIGHT PALMAR/SMALL ;  Surgeon: RCammie Sickle MD;  [place]  Service: Orthopedics;  Laterality: Right;  . HEMOSTASIS CLIP PLACEMENT  07/14/2020   Procedure: HEMOSTASIS CLIP PLACEMENT;  Surgeon: KRonnette Juniper MD;  [facility];  Service: Gastroenterology;;  . MAZE N/A 08/10/2018   Procedure: MAZE;  Surgeon: ORexene Alberts MD;  [place]  Service: Open Heart Surgery;  Laterality: N/A;  . MITRAL VALVE REPAIR N/A 08/10/2018   Procedure: MITRAL VALVE REPAIR (MVR);  Surgeon: ORexene Alberts MD;  [place]  Service: Open Heart Surgery;  Laterality: N/A;  glutaraldehyde  . POLYPECTOMY  07/14/2020   Procedure: POLYPECTOMY;  Surgeon: KRonnette Juniper MD;  [facility];  Service: Gastroenterology;;  . RIGHT/LEFT HEART CATH AND CORONARY ANGIOGRAPHY N/A 06/20/2018   Procedure: RIGHT/LEFT HEART CATH AND CORONARY ANGIOGRAPHY;  Surgeon: SBelva Crome MD;  [facility];  Service: Cardiovascular;  Laterality: N/A;  . TEE WITHOUT CARDIOVERSION N/A 06/20/2018   Procedure: TRANSESOPHAGEAL ECHOCARDIOGRAM (TEE);  Surgeon: HPixie Casino MD;  [facility];  Service: Cardiovascular;  Laterality: N/A;  . TEE WITHOUT CARDIOVERSION N/A 08/10/2018   Procedure: TRANSESOPHAGEAL ECHOCARDIOGRAM (TEE);  Surgeon: Rexene Alberts, MD;  Location: [place];  Service: Open Heart Surgery;  Laterality: N/A;  . TONSILLECTOMY  age 70     Home Medications:  Prior to Admission medications   Medication Sig Start Date End Date Taking? Authorizing Provider  acetaminophen (TYLENOL) 325 MG tablet Take 325 mg by mouth every 6 (six) hours as needed for mild pain or fever.   Yes [provider]  allopurinol (ZYLOPRIM) 100 MG tablet Take 200 mg by mouth every evening.  06/21/13  Yes [provider]    atorvastatin (LIPITOR) 20 MG tablet TAKE 1 TABLET BY MOUTH EVERY DAY Patient taking differently: Take 20 mg by mouth daily.  04/07/20  Yes Camnitz, Michele Hassell Done, MD  diltiazem (CARDIZEM CD) 180 MG 24 hr capsule Take 1 capsule by mouth daily. 08/14/20  Yes [provider]  furosemide (LASIX) 20 MG tablet Take 20 mg by mouth daily as needed (for swelling/fluid retention.).    Yes [provider]  KLOR-CON M20 20 MEQ tablet TAKE 1 TABLET BY MOUTH EVERY DAY Patient taking differently: Take 20 mEq by mouth daily.  05/21/19  Yes [name], MD  mesalamine (APRISO) 0.375 g 24 hr capsule Take 1.5 g by mouth daily.  06/01/19  Yes [provider]  metoprolol tartrate (LOPRESSOR) 100 MG tablet TAKE 1 TABLET BY MOUTH TWICE A DAY Patient taking differently: Take 100 mg by mouth 2 (two) times daily.  03/10/20  Yes [name], MD  nitrofurantoin (MACRODANTIN) 50 MG capsule Take 50 mg by mouth at bedtime.   Yes [provider]  phenazopyridine (PYRIDIUM) 200 MG tablet Take 1 tablet (200 mg total) by mouth 3 (three) times daily as needed for pain. 06/20/19  Yes Ardis Hughs, MD  predniSONE (DELTASONE) 10 MG tablet Take 20 mg by mouth daily. 08/11/20  Yes [provider]  tamoxifen (NOLVADEX) 20 MG tablet TAKE 1 TABLET BY MOUTH EVERY DAY Patient taking differently: Take 20 mg by mouth daily.  03/03/20  Yes Truitt Merle, MD  traMADol (ULTRAM) 50 MG tablet Take 1-2 tablets (50-100 mg total) by mouth every 6 (six) hours as needed for moderate pain. 06/20/19  Yes Ardis Hughs, MD  XARELTO 15 MG TABS tablet TAKE 1 TABLET BY MOUTH  DAILY WITH SUPPER Patient taking differently: Take 15 mg by mouth daily.  03/24/20  Yes [name], MD  acetaminophen (TYLENOL) 500 MG tablet Take 500-1,000 mg by mouth every 6 (six) hours as needed for moderate pain or headache. Patient not taking: Reported on 08/17/2020    [provider]  HYDROcodone-acetaminophen  (NORCO/VICODIN) 5-325 MG tablet Take 1 tablet by mouth every 4 (four) hours as needed. 08/16/20   Isla Pence, MD    Inpatient Medications: Scheduled Meds: . allopurinol  200 mg Oral QPM  . atorvastatin  20 mg Oral Daily  . enoxaparin (LOVENOX) injection  40 mg Subcutaneous Q24H  . mesalamine  1,500 mg Oral Daily  . metoprolol tartrate  50 mg Oral BID  . potassium chloride SA  20 mEq Oral Daily  . senna-docusate  2 tablet Oral BID  . tamoxifen  20 mg Oral Daily   Continuous Infusions:  PRN Meds: acetaminophen, furosemide, HYDROmorphone (DILAUDID) injection, oxyCODONE  Allergies:    Allergies  Allergen Reactions  . Shellfish Allergy Itching    Social History:   Social  History   Socioeconomic History  . Marital status: Widowed    Spouse name: Not on file  . Number of children: 1  . Years of education: Not on file  . Highest education level: Not on file  Occupational History  . Not on file  Tobacco Use  . Smoking status: Former Smoker    Packs/day: 1.00    Years: 20.00    Pack years: 20.00    Types: Cigarettes    Quit date: 11/22/1992    Years since quitting: 27.7  . Smokeless tobacco: Never Used  Vaping Use  . Vaping Use: Never used  Substance and Sexual Activity  . Alcohol use: Yes    Alcohol/week: 3.0 standard drinks    Types: 3 Standard drinks or equivalent per week    Comment: social  . Drug use: No  . Sexual activity: Not Currently    Partners: Male    Birth control/protection: Post-menopausal  Other Topics Concern  . Not on file  Social History Narrative  . Not on file   Social Determinants of Health   Financial Resource Strain:   . Difficulty of Paying Living Expenses: Not on file  Food Insecurity:   . Worried About Charity fundraiser in the Last Year: Not on file  . Ran Out of Food in the Last Year: Not on file  Transportation Needs:   . Lack of Transportation (Medical): Not on file  . Lack of Transportation (Non-Medical): Not on file    Physical Activity:   . Days of Exercise per Week: Not on file  . Minutes of Exercise per Session: Not on file  Stress:   . Feeling of Stress : Not on file  Social Connections:   . Frequency of Communication with Friends and Family: Not on file  . Frequency of Social Gatherings with Friends and Family: Not on file  . Attends Religious Services: Not on file  . Active Member of Clubs or Organizations: Not on file  . Attends Archivist Meetings: Not on file  . Marital Status: Not on file  Intimate Partner Violence:   . Fear of Current or Ex-Partner: Not on file  . Emotionally Abused: Not on file  . Physically Abused: Not on file  . Sexually Abused: Not on file    Family History:   Family History  Problem Relation Age of Onset  . Cirrhosis Mother   . Breast cancer Mother 65  . Heart failure Father   . Heart attack Father   . Breast cancer Sister        early 28's  . Kidney Stones Sister        loss of kidney due to stones  . Osteoporosis Neg Hx      ROS:  Please see the history of present illness.  All other ROS reviewed and negative.     Physical Exam/Data:   Vitals:   08/17/20 0051 08/17/20 0200 08/17/20 0209 08/17/20 0226  BP: 120/90  114/66   Pulse: 84  (!) 55 (!) 107  Resp: 15  18   Temp:   97.6 F (36.4 C)   TempSrc:      SpO2: 100%  99%   Weight:  56 kg    Height:  5' 4"  (1.626 m)      Intake/Output Summary (Last 24 hours) at 08/17/2020 1111 Last data filed at 08/17/2020 0400 Gross per 24 hour  Intake 117.77 ml  Output 300 ml  Net -182.23 ml  Last 3 Weights 08/17/2020 08/14/2020 07/14/2020  Weight (lbs) 123 lb 7.3 oz 126 lb 6.4 oz 124 lb  Weight (kg) 56 kg 57.335 kg 56.246 kg     Body mass index is 21.19 kg/m.  General:  Well nourished, well developed, in no acute distress HEENT: normal Lymph: no adenopathy Neck: no JVD Endocrine:  No thryomegaly Vascular: No carotid bruits; FA pulses 2+ bilaterally without bruits  Cardiac:  normal S1,  S2; RRR; no murmur  Lungs:  clear to auscultation bilaterally, no wheezing, rhonchi or rales  Abd: soft, nontender, no hepatomegaly  Ext: no edema Musculoskeletal:  No deformities, BUE and BLE strength normal and equal Skin: warm and dry  Neuro:  CNs 2-12 intact, no focal abnormalities noted Psych:  Normal affect   EKG:  The EKG was personally reviewed and demonstrates:   Telemetry:  Telemetry was personally reviewed and demonstrates:    Relevant CV Studies:  Echo 08/2019 1. Left ventricular ejection fraction, by visual estimation, is 60 to  65%. The left ventricle has normal function. There is moderately increased  left ventricular hypertrophy.  2. Global right ventricle has moderately reduced systolic function.The  right ventricular size is normal. No increase in right ventricular wall  thickness.  3. Left atrial size was severely dilated.  4. Right atrial size was normal.  5. The mitral valve has been repaired/replaced. Mild mitral valve  regurgitation. Mild-moderate mitral stenosis.  6. MV peak gradient, 19.4 mmHg. HR 117 during this study.  7. The tricuspid valve is grossly normal. Tricuspid valve regurgitation  is mild.  8. The aortic valve is tricuspid Aortic valve regurgitation was not  visualized by color flow Doppler.  9. The pulmonic valve was grossly normal. Pulmonic valve regurgitation is  trivial by color flow Doppler.  10. Moderately elevated pulmonary artery systolic pressure.  11. The inferior vena cava is dilated in size with <50% respiratory  variability, suggesting right atrial pressure of 15 mmHg.   Cardiac cath 05/2018  Severe mitral regurgitation with marked enlargement of the left atrium.  Mild pulmonary hypertension with mean pulmonary arterial pressure 28 mmHg.  Phasic pressure 45/16 mmHg  Three-vessel coronary calcification with significant greater than 80% distal circumflex, 90% first diagonal ostium, and 80 to 90% mid LAD.  Normal LV  systolic function with EF 50 to 55%.  LVEDP 11 mmHg  Cardiac output 4.3 L/min/cardiac index 2.7 L/min/m  RECOMMENDATIONS:   Referred to Dr. Remus Loffler for consideration of surgical mitral valve repair left atrial appendage ligation, consideration of Maze procedure, and coronary revascularization.  Coronary Diagrams  Diagnostic Dominance: Right      Laboratory Data:  High Sensitivity Troponin:   Recent Labs  Lab 08/16/20 1452 08/16/20 2352 08/17/20 0539 08/17/20 0614  TROPONINIHS 24* 27* 37* 33*     Chemistry Recent Labs  Lab 08/16/20 1452 08/17/20 0614  NA 138 141  K 4.2 3.9  CL 103 105  CO2 22 24  GLUCOSE 111* 83  BUN 20 16  CREATININE 0.82 0.81  CALCIUM 8.9 8.4*  GFRNONAA >60 >60  GFRAA >60 >60  ANIONGAP 13 12    Recent Labs  Lab 08/16/20 1452  PROT 7.1  ALBUMIN 3.7  AST 34  ALT 37  ALKPHOS 44  BILITOT 0.7   Hematology Recent Labs  Lab 08/16/20 1452 08/17/20 0614  WBC 16.4* 10.7*  RBC 4.15 3.87  HGB 13.7 12.8  HCT 43.6 39.9  MCV 105.1* 103.1*  MCH 33.0 33.1  MCHC 31.4 32.1  RDW 16.3* 15.9*  PLT 157 128*   BNP Recent Labs  Lab 08/16/20 1452  BNP 482.0*    DDimer No results for input(s): DDIMER in the last 168 hours.   Radiology/Studies:  DG Chest 2 View  Result Date: 08/16/2020 CLINICAL DATA:  Hypoxia.  Mid back pain. EXAM: CHEST - 2 VIEW COMPARISON:  Radiograph 07/22/2020 FINDINGS: Lower lung volumes from prior exam. Post median sternotomy. CABG with left atrial clipping. Prosthetic cardiac valve. Similar cardiomegaly to prior exam. Unchanged mediastinal contours. Mild increase in left basilar atelectasis from prior exam. Bronchovascular crowding versus vascular congestion. No pneumothorax or pleural effusion. Midthoracic vertebral compression which was assessed on radiograph and MRI earlier today. IMPRESSION: 1. Lower lung volumes from prior exam with bronchovascular crowding versus vascular congestion. 2. Mild increase in left  basilar atelectasis. Electronically Signed   By: Keith Rake M.D.   On: 08/16/2020 21:20   DG Thoracic Spine W/Swimmers  Result Date: 08/16/2020 CLINICAL DATA:  79 year old Green presenting with upper back pain. EXAM: THORACIC SPINE - 3 VIEWS COMPARISON:  Chest radiograph from May 31 21, CT abdomen pelvis from 05/22/2020, and chest radiograph from 05/17/2020 FINDINGS: Interval anterior wedge compression deformity of the T7 vertebral body with associated focal kyphosis at this level. There is approximately 75% anterior vertebral body height loss. Unchanged chronic compression fractures of the L1-L2 vertebral bodies. Diffuse osteopenia. Similar appearing thoracic and abdominal aortic atherosclerotic calcifications. Median sternotomy wires in place. Left atrial appendage clip and mitral prosthesis in place. IMPRESSION: Acute compression fracture of the T7 vertebral body with approximately 75% anterior vertebral body height loss. Consider thoracic spine MRI for further characterization. Diffuse osteopenia. Aortic Atherosclerosis (ICD10-I70.0). These results were called by telephone at the time of interpretation on 08/16/2020 at 2:28 pm to provider [facility] , who verbally acknowledged these results. Electronically Signed   By: Ruthann Cancer MD   On: 08/16/2020 14:29   MR THORACIC SPINE WO CONTRAST  Result Date: 08/16/2020 CLINICAL DATA:  Mid back pain for 3 days. EXAM: MRI THORACIC SPINE WITHOUT CONTRAST TECHNIQUE: Multiplanar, multisequence MR imaging of the thoracic spine was performed. No intravenous contrast was administered. COMPARISON:  Thoracic spine films, same date. FINDINGS: Alignment: Normal overall alignment of the thoracic vertebral bodies. Slightly exaggerated thoracic kyphosis due to the significant T7 compression fracture. Vertebrae: T7 compression fracture appears acute or subacute. The vertebral body is significantly compressed anteriorly approximately 75%. There is mild retropulsion  involving the posteroinferior aspect of the vertebral body but no significant canal stenosis. Remote compression fractures noted in the lumbar spine. The other thoracic vertebral bodies are maintained. Cord:  Normal cord signal intensity.  No cord lesions or syrinx. Paraspinal and other soft tissues: Mild paraspinal hematoma at T7. There also small pleural effusions and bibasilar atelectasis. Disc levels: No significant thoracic disc protrusions, spinal or foraminal stenosis. Mild retropulsion at T7 with mild canal narrowing at T7-8 but no significant canal stenosis or cord compression. IMPRESSION: 1. Acute or subacute T7 compression fracture with mild retropulsion involving the posteroinferior aspect of the vertebral body but no significant canal stenosis. 2. Remote compression fractures of the lumbar spine. 3. No significant thoracic disc protrusions, spinal or foraminal stenosis. Electronically Signed   By: Marijo Sanes M.D.   On: 08/16/2020 18:02   {   Assessment and Plan:   Persistent atypical Afib/flutter Patient has very complicated history with afib. She has failed multiple therapies including Maze procedure and cardioversion. Not a ablation candidate. LA severely dilated -  on Xarelto for stroke prophylaxis>>held for spinal hematoma - continue rate control with IV dilt, Lopressor - CHADSVASC of 5. Michele discuss a/c with MD  T7 compression fracture with mild paraspinal hematoma - anticoagulation held as above - Michele discuss restarting with MD  Elevated troponin with known CAD s/p CABG - HS troponin 37>33 - Denies chest pain - likely demand ischemia int he setting of afib RVR - continue statin and BB  Chronic diastolic CHF - BNP 716, imaging with mild pulmonary edema - Last echo showed preserved EF - Home lasix 20 mg restarted  Acute hypoxic respiratory failure - 2L O2 - home lasix restarted   For questions or updates, please contact [facility] Please consult [email or web address]  for contact info under    Signed, Cadence Arlyss Repress  08/17/2020 11:11 AM   Cardiology Attending  Patient seen and examined. Agree with above. The patient has developed a spinal hematoma in the setting of chronic anti-coagulation. Her CHADSVASC score is high and [name] need to be back on Xarelto when ever neurosurgery says it is safe. She is not a candidate for rhythm control as she has failed amio and MAZE. Use IV cardizem if needed for rate control. Her exam does not suggest that she is markedly volume overloaded. IV lasix as needed.  Royston Sinner Evani Shrider,MD

## 2020-08-18 DIAGNOSIS — I5032 Chronic diastolic (congestive) heart failure: Secondary | ICD-10-CM

## 2020-08-18 DIAGNOSIS — I10 Essential (primary) hypertension: Secondary | ICD-10-CM

## 2020-08-18 DIAGNOSIS — T148XXA Other injury of unspecified body region, initial encounter: Secondary | ICD-10-CM

## 2020-08-18 DIAGNOSIS — I248 Other forms of acute ischemic heart disease: Secondary | ICD-10-CM

## 2020-08-18 DIAGNOSIS — I4892 Unspecified atrial flutter: Secondary | ICD-10-CM

## 2020-08-18 DIAGNOSIS — Z951 Presence of aortocoronary bypass graft: Secondary | ICD-10-CM

## 2020-08-18 DIAGNOSIS — Z9889 Other specified postprocedural states: Secondary | ICD-10-CM

## 2020-08-18 DIAGNOSIS — Z8679 Personal history of other diseases of the circulatory system: Secondary | ICD-10-CM

## 2020-08-18 LAB — CBC WITH DIFFERENTIAL/PLATELET
Abs Immature Granulocytes: 0.08 10*3/uL — ABNORMAL HIGH (ref 0.00–0.07)
Basophils Absolute: 0 10*3/uL (ref 0.0–0.1)
Basophils Relative: 0 %
Eosinophils Absolute: 0.1 10*3/uL (ref 0.0–0.5)
Eosinophils Relative: 1 %
HCT: 38.4 % (ref 36.0–46.0)
Hemoglobin: 12.3 g/dL (ref 12.0–15.0)
Immature Granulocytes: 1 %
Lymphocytes Relative: 19 %
Lymphs Abs: 1.3 10*3/uL (ref 0.7–4.0)
MCH: 33.7 pg (ref 26.0–34.0)
MCHC: 32 g/dL (ref 30.0–36.0)
MCV: 105.2 fL — ABNORMAL HIGH (ref 80.0–100.0)
Monocytes Absolute: 0.6 10*3/uL (ref 0.1–1.0)
Monocytes Relative: 8 %
Neutro Abs: 4.8 10*3/uL (ref 1.7–7.7)
Neutrophils Relative %: 71 %
Platelets: 103 10*3/uL — ABNORMAL LOW (ref 150–400)
RBC: 3.65 MIL/uL — ABNORMAL LOW (ref 3.87–5.11)
RDW: 15.4 % (ref 11.5–15.5)
WBC: 6.8 10*3/uL (ref 4.0–10.5)
nRBC: 0 % (ref 0.0–0.2)

## 2020-08-18 LAB — BASIC METABOLIC PANEL
Anion gap: 7 (ref 5–15)
BUN: 14 mg/dL (ref 8–23)
CO2: 23 mmol/L (ref 22–32)
Calcium: 8.1 mg/dL — ABNORMAL LOW (ref 8.9–10.3)
Chloride: 108 mmol/L (ref 98–111)
Creatinine, Ser: 0.86 mg/dL (ref 0.44–1.00)
GFR calc Af Amer: >60 mL/min (ref >60)
GFR calc non Af Amer: >60 mL/min (ref >60)
Glucose, Bld: 84 mg/dL (ref 70–99)
Potassium: 3.9 mmol/L (ref 3.5–5.1)
Sodium: 138 mmol/L (ref 135–145)

## 2020-08-18 LAB — URINE CULTURE

## 2020-08-18 LAB — PROTIME-INR
INR: 1.2 (ref 0.8–1.2)
Prothrombin Time: 15.2 seconds (ref 11.4–15.2)

## 2020-08-18 MED ORDER — ENOXAPARIN SODIUM 40 MG/0.4ML ~~LOC~~ SOLN: 40.0000 mg | SUBCUTANEOUS | Status: DC

## 2020-08-18 MED ORDER — OXYCODONE HCL 5 MG PO TABS
5.0000 mg | ORAL_TABLET | ORAL | Status: DC | PRN
  Administered 2020-08-18 – 2020-08-23 (×11): 5 mg via ORAL
  Filled 2020-08-18 (×11): qty 1

## 2020-08-18 MED ORDER — PANTOPRAZOLE SODIUM 40 MG PO TBEC
40.0000 mg | DELAYED_RELEASE_TABLET | Freq: Every day | ORAL | Status: DC
  Administered 2020-08-18 – 2020-08-23 (×6): 40 mg via ORAL
  Filled 2020-08-18 (×6): qty 1

## 2020-08-18 MED ORDER — TRAMADOL HCL 50 MG PO TABS: 50.0000 mg | ORAL_TABLET | Freq: Four times a day (QID) | ORAL | Status: DC | PRN

## 2020-08-18 MED ORDER — METOPROLOL TARTRATE 25 MG PO TABS
25.0000 mg | ORAL_TABLET | Freq: Once | ORAL | Status: AC
  Administered 2020-08-18: 25 mg via ORAL
  Filled 2020-08-18: qty 1

## 2020-08-18 MED ORDER — ALPRAZOLAM 0.25 MG PO TABS
0.2500 mg | ORAL_TABLET | Freq: Three times a day (TID) | ORAL | Status: DC | PRN
  Administered 2020-08-18 – 2020-08-21 (×3): 0.25 mg via ORAL
  Filled 2020-08-18 (×4): qty 1

## 2020-08-18 MED ORDER — CEFAZOLIN SODIUM-DEXTROSE 2-4 GM/100ML-% IV SOLN
2.0000 g | INTRAVENOUS | Status: AC
  Administered 2020-08-19: 2 g via INTRAVENOUS

## 2020-08-18 MED ORDER — ACETAMINOPHEN 325 MG PO TABS: 325.0000 mg | ORAL_TABLET | Freq: Four times a day (QID) | ORAL | Status: DC | PRN

## 2020-08-18 MED ORDER — FUROSEMIDE 20 MG PO TABS
20.0000 mg | ORAL_TABLET | Freq: Every day | ORAL | Status: DC
  Administered 2020-08-18 – 2020-08-19 (×2): 20 mg via ORAL
  Filled 2020-08-18 (×3): qty 1

## 2020-08-18 MED ORDER — PREDNISONE 20 MG PO TABS
20.0000 mg | ORAL_TABLET | Freq: Every day | ORAL | Status: DC
  Administered 2020-08-20 – 2020-08-23 (×4): 20 mg via ORAL
  Filled 2020-08-18 (×5): qty 1

## 2020-08-18 MED ORDER — HEPARIN (PORCINE) 25000 UT/250ML-% IV SOLN
650.0000 [IU]/h | INTRAVENOUS | Status: AC
  Administered 2020-08-18: 650 [IU]/h via INTRAVENOUS
  Filled 2020-08-18: qty 250

## 2020-08-18 MED ORDER — METOPROLOL TARTRATE 100 MG PO TABS
100.0000 mg | ORAL_TABLET | Freq: Two times a day (BID) | ORAL | Status: DC
  Administered 2020-08-18 – 2020-08-19 (×2): 100 mg via ORAL
  Filled 2020-08-18 (×2): qty 1

## 2020-08-18 MED ORDER — ONDANSETRON HCL 4 MG/2ML IJ SOLN: 4.0000 mg | Freq: Four times a day (QID) | INTRAMUSCULAR | Status: DC | PRN

## 2020-08-18 MED ORDER — METOPROLOL TARTRATE 50 MG PO TABS: 75.0000 mg | ORAL_TABLET | Freq: Two times a day (BID) | ORAL | Status: DC

## 2020-08-18 MED FILL — Diltiazem HCl IV For Soln 100 MG: INTRAVENOUS | Qty: 100 | Status: AC

## 2020-08-18 NOTE — Progress Notes (Signed)
   08/18/20 0646  Assess: MEWS Score  Temp 97.9 F (36.6 C)  BP 94/71  Pulse Rate (!) 135  Resp 16  SpO2 100 %  O2 Device Nasal Cannula  O2 Flow Rate (L/min) 2 L/min  Assess: MEWS Score  MEWS Temp 0  MEWS Systolic 1  MEWS Pulse 3  MEWS RR 0  MEWS LOC 0  MEWS Score 4  MEWS Score Color Red  Assess: if the MEWS score is Yellow or Red  Were vital signs taken at a resting state? Yes  Focused Assessment Change from prior assessment (see assessment flowsheet)  Early Detection of Sepsis Score *See Row Information* Low  MEWS guidelines implemented *See Row Information* Yes  Take Vital Signs  Increase Vital Sign Frequency  Red: Q 1hr X 4 then Q 4hr X 4, if remains red, continue Q 4hrs  Notify: Provider  Provider Name/Title Pokhrel   Date Provider Notified 08/18/20  Time Provider Notified 0700  Notification Type Page  Notification Reason Change in status;Other (Comment) (HR went back to 130's )  Document  Progress note created (see row info) Yes

## 2020-08-18 NOTE — Progress Notes (Signed)
Chief Complaint: Patient was seen in consultation today for T7 compression fx  Referring Physician(s): Dr. Flora Lipps  Supervising Physician: Luanne Bras  Patient Status: [facility] - In-pt  History of Present Illness: Michele Green is a 79 y.o. female admitted with acute mid upper back pain. Found to have acute T7 compression fx with mild associated paraspinal hematoma. Neurosurgery consulted, no urgent surgical intervention required but recommended IR consult for consideration of vertebroplasty. Pt reports prior hx of back fractures and known/treated osteoporosis. IR asked to eval. PMHx, meds, labs, imaging, allergies reviewed. Normally on Xarelto for A. Fib, this has been held since admission C/o back pain 8-9/10 mostly with movement, relieved with pain meds and rest.   Past Medical History:  Diagnosis Date  . Arrhythmia   . Atypical atrial flutter ([place]) 08/14/2018   Post-operative  . Breast cancer of upper-outer quadrant of left female breast ([place]) 02/02/2016  . CAD (coronary artery disease) 06/20/2018   LHC 7/19: pLAD 65/90, oD1 90, mLCx 85, OM2 50, oRCA 30, EF 50-55 >> s/p CABG in 9/19 (L-LAD)  . Colon polyp 02/2012  . Dupuytren contracture    bilateral hands  . Family history of breast cancer   . Hyperlipidemia   . Hypertension   . Mitral regurgitation 11/07/2013   Echo 7/19: Mild LVH, EF 60-65, no RWMA, mod ot severe MR, massive LAE, PASP 33 // TEE 7/19:  Mild conc LVH, EF 60-65, no RWMA, severe MR with mild post leaflet prolapse, massive // s/p MV repair 07/2018  . On continuous oral anticoagulation 08/22/2015   Started on Xarelto 08/12/2015   . Osteopenia   . Persistent atrial fibrillation ([place]) 08/22/2015   Started late August or early September 2016 // s/p Maze procedure 07/2018  . Personal history of radiation therapy   . Radiation Therapy 04/21/16-05/19/16   left breast 47.72 Gy, boosted to 10 Gy  . 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 S/P Maze  operation for atrial fibrillation 08/10/2018   Complete bilateral atrial lesion set using bipolar radiofrequency and cryothermy ablation with clipping of LA appendage  . S/P MVR (mitral valve repair) 08/10/2018   Complex valvuloplasty including Gore-tex neochord placement x8, Plication of Lateral Commissure and 52m Sorin Memo 4D Ring Annuloplasty SN# [id number] . [place] ([place]   . Wears glasses     Past Surgical History:  Procedure Laterality Date  . BIOPSY  07/14/2020   Procedure: BIOPSY;  Surgeon: KRonnette Juniper MD;  Location: [facility];  Service: Gastroenterology;;  . BREAST BIOPSY Left 01/28/2016  . BREAST LUMPECTOMY Left 02/23/2016  . BREAST LUMPECTOMY WITH RADIOACTIVE SEED LOCALIZATION Left 02/23/2016   Procedure: BREAST LUMPECTOMY WITH RADIOACTIVE SEED LOCALIZATION;  Surgeon: BExcell Seltzer MD;  [place]  Service: General;  Laterality: Left;  . CARDIAC CATHETERIZATION    . CARDIOVERSION N/A 10/02/2015   Procedure: CARDIOVERSION;  Surgeon: MJerline Pain MD;  [facility];  Service: Cardiovascular;  Laterality: N/A;  . CARDIOVERSION N/A 10/05/2018   Procedure: CARDIOVERSION;  Surgeon: RFay Records MD;  [facility];  Service: Cardiovascular;  Laterality: N/A;  . CARDIOVERSION N/A 11/05/2019   Procedure: CARDIOVERSION;  Surgeon: NDorothy Spark MD;  [place]  Service: Cardiovascular;  Laterality: N/A;  . CLIPPING OF ATRIAL APPENDAGE  08/10/2018   Procedure: CLIPPING OF ATRIAL APPENDAGE;  Surgeon: ORexene Alberts MD;  [place]  Service: Open Heart Surgery;;  . COLONOSCOPY    .  COLONOSCOPY WITH PROPOFOL N/A 07/14/2020   Procedure: COLONOSCOPY WITH PROPOFOL;  Surgeon: Ronnette Juniper, MD;  [facility];  Service: Gastroenterology;  Laterality: N/A;  . CORONARY ARTERY BYPASS GRAFT N/A 08/10/2018   Procedure: CORONARY ARTERY BYPASS GRAFTING (CABG) x 1, LIMA-LAD,  USING LEFT INTERNAL MAMMARY ARTERY.  HARVESTED RIGHT GREATER SAPHENOUS VEIN ENDOSCOPICALLY;  Surgeon: Rexene Alberts, MD;  Location: [place];  Service: Open Heart Surgery;  Laterality: N/A;  . CYSTOSCOPY W/ RETROGRADES Bilateral 06/20/2019   Procedure: CYSTOSCOPY WITH RETROGRADE PYELOGRAM;  Surgeon: Ardis Hughs, MD;  [facility];  Service: Urology;  Laterality: Bilateral;  . CYSTOSCOPY WITH HYDRODISTENSION AND BIOPSY N/A 06/20/2019   Procedure: CYSTOSCOPY BLADDER  BIOPSY WITH FULGERATION;  Surgeon: Ardis Hughs, MD;  Location[facility];  Service: Urology;  Laterality: N/A;  . DUPUYTREN CONTRACTURE RELEASE  2001   leftx2  . [place]   right  . DUPUYTREN CONTRACTURE RELEASE Right 05/02/2014   Procedure: EXCISION DUPUYTRENS RIGHT PALMAR/SMALL ;  Surgeon: Cammie Sickle, MD;  Location: [place];  Service: Orthopedics;  Laterality: Right;  . HEMOSTASIS CLIP PLACEMENT  07/14/2020   Procedure: HEMOSTASIS CLIP PLACEMENT;  Surgeon: Ronnette Juniper, MD;  [facility];  Service: Gastroenterology;;  . MAZE N/A 08/10/2018   Procedure: MAZE;  Surgeon: Rexene Alberts, MD;  Location: [place];  Service: Open Heart Surgery;  Laterality: N/A;  . MITRAL VALVE REPAIR N/A 08/10/2018   Procedure: MITRAL VALVE REPAIR (MVR);  Surgeon: Rexene Alberts, MD;  Location: [place];  Service: Open Heart Surgery;  Laterality: N/A;  glutaraldehyde  . POLYPECTOMY  07/14/2020   Procedure: POLYPECTOMY;  Surgeon: Ronnette Juniper, MD;  [facility];  Service: Gastroenterology;;  . RIGHT/LEFT HEART CATH AND CORONARY ANGIOGRAPHY N/A 06/20/2018   Procedure: RIGHT/LEFT HEART CATH AND CORONARY ANGIOGRAPHY;  Surgeon: [name], MD;  Location: [place] [facility];  Service: Cardiovascular;  Laterality: N/A;  . TEE WITHOUT CARDIOVERSION N/A 06/20/2018   Procedure: TRANSESOPHAGEAL ECHOCARDIOGRAM (TEE);  Surgeon: Pixie Casino, MD;  Location[facility];  Service:  Cardiovascular;  Laterality: N/A;  . TEE WITHOUT CARDIOVERSION N/A 08/10/2018   Procedure: TRANSESOPHAGEAL ECHOCARDIOGRAM (TEE);  Surgeon: Rexene Alberts, MD;  Location: [place];  Service: Open Heart Surgery;  Laterality: N/A;  . TONSILLECTOMY  age 52    Allergies: Shellfish allergy  Medications:  Current Facility-Administered Medications:  .  acetaminophen (TYLENOL) tablet 500-1,000 mg, 500-1,000 mg, Oral, Q6H PRN, Irene Pap N, DO, 1,000 mg at 08/18/20 0252 .  allopurinol (ZYLOPRIM) tablet 200 mg, 200 mg, Oral, QPM, Hall, Carole N, DO, 200 mg at 08/17/20 1629 .  atorvastatin (LIPITOR) tablet 20 mg, 20 mg, Oral, Daily, Hall, Carole N, DO, 20 mg at 08/18/20 9678 .  [START ON 08/19/2020] ceFAZolin (ANCEF) IVPB 2g/100 mL premix, 2 g, Intravenous, On Call, Kewanda Poland, Lennette Bihari, PA-C .  [START ON 08/20/2020] enoxaparin (LOVENOX) injection 40 mg, 40 mg, Subcutaneous, Q24H, Vernee Baines, PA-C .  furosemide (LASIX) tablet 20 mg, 20 mg, Oral, Daily, Donato Heinz, MD, 20 mg at 08/18/20 1009 .  HYDROmorphone (DILAUDID) injection 0.5 mg, 0.5 mg, Intravenous, Q4H PRN, Irene Pap N, DO, 0.5 mg at 08/17/20 0328 .  mesalamine (PENTASA) CR capsule 1,500 mg, 1,500 mg, Oral, Daily, Hall, Carole N, DO, 1,500 mg at 08/18/20 0825 .  metoprolol tartrate (LOPRESSOR) tablet 75 mg, 75 mg, Oral, BID, Donato Heinz, MD .  oxyCODONE (  Oxy IR/ROXICODONE) immediate release tablet 5 mg, 5 mg, Oral, Q3H PRN, Pokhrel, Laxman, MD, 5 mg at 08/18/20 1323 .  potassium chloride SA (KLOR-CON) CR tablet 20 mEq, 20 mEq, Oral, Daily, Irene Pap N, DO, 20 mEq at 08/18/20 0824 .  senna-docusate (Senokot-S) tablet 2 tablet, 2 tablet, Oral, BID, Kayleen Memos, DO, 2 tablet at 08/18/20 1914 .  tamoxifen (NOLVADEX) tablet 20 mg, 20 mg, Oral, Daily, Irene Pap N, DO, 20 mg at 08/18/20 7829    Family History  Problem Relation Age of Onset  . Cirrhosis Mother   . Breast cancer Mother 33  . Heart failure Father   .  Heart attack Father   . Breast cancer Sister        early 75's  . Kidney Stones Sister        loss of kidney due to stones  . Osteoporosis Neg Hx     Social History   Socioeconomic History  . Marital status: Widowed    Spouse name: Not on file  . Number of children: 1  . Years of education: Not on file  . Highest education level: Not on file  Occupational History  . Not on file  Tobacco Use  . Smoking status: Former Smoker    Packs/day: 1.00    Years: 20.00    Pack years: 20.00    Types: Cigarettes    Quit date: 11/22/1992    Years since quitting: 27.7  . Smokeless tobacco: Never Used  Vaping Use  . Vaping Use: Never used  Substance and Sexual Activity  . Alcohol use: Yes    Alcohol/week: 3.0 standard drinks    Types: 3 Standard drinks or equivalent per week    Comment: social  . Drug use: No  . Sexual activity: Not Currently    Partners: Male    Birth control/protection: Post-menopausal  Other Topics Concern  . Not on file  Social History Narrative  . Not on file   Social Determinants of Health   Financial Resource Strain:   . Difficulty of Paying Living Expenses: Not on file  Food Insecurity:   . Worried About Charity fundraiser in the Last Year: Not on file  . Ran Out of Food in the Last Year: Not on file  Transportation Needs:   . Lack of Transportation (Medical): Not on file  . Lack of Transportation (Non-Medical): Not on file  Physical Activity:   . Days of Exercise per Week: Not on file  . Minutes of Exercise per Session: Not on file  Stress:   . Feeling of Stress : Not on file  Social Connections:   . Frequency of Communication with Friends and Family: Not on file  . Frequency of Social Gatherings with Friends and Family: Not on file  . Attends Religious Services: Not on file  . Active Member of Clubs or Organizations: Not on file  . Attends Archivist Meetings: Not on file  . Marital Status: Not on file    Review of Systems: A 12  point ROS discussed and pertinent positives are indicated in the HPI above.  All other systems are negative.  Review of Systems  Vital Signs: BP 104/65 (BP Location: Right Arm)   Pulse 91   Temp (!) 97.4 F (36.3 C) (Oral)   Resp 18   Ht 5' 4"  (1.626 m)   Wt 55.9 kg   SpO2 98%   BMI 21.15 kg/m   Physical Exam Constitutional:  Appearance: Normal appearance.  HENT:     Mouth/Throat:     Mouth: Mucous membranes are moist.     Pharynx: Oropharynx is clear.  Cardiovascular:     Rate and Rhythm: Normal rate. Rhythm irregular.     Heart sounds: Normal heart sounds.  Pulmonary:     Effort: Pulmonary effort is normal. No respiratory distress.     Breath sounds: Normal breath sounds.  Musculoskeletal:     Comments: Tender mid thoracic spine between scapula  Skin:    General: Skin is warm and dry.  Neurological:     General: No focal deficit present.     Mental Status: She is alert and oriented to person, place, and time.  Psychiatric:        Mood and Affect: Mood normal.        Thought Content: Thought content normal.        Judgment: Judgment normal.     Imaging: DG Chest 2 View  Result Date: 08/16/2020 CLINICAL DATA:  Hypoxia.  Mid back pain. EXAM: CHEST - 2 VIEW COMPARISON:  Radiograph 07/22/2020 FINDINGS: Lower lung volumes from prior exam. Post median sternotomy. CABG with left atrial clipping. Prosthetic cardiac valve. Similar cardiomegaly to prior exam. Unchanged mediastinal contours. Mild increase in left basilar atelectasis from prior exam. Bronchovascular crowding versus vascular congestion. No pneumothorax or pleural effusion. Midthoracic vertebral compression which was assessed on radiograph and MRI earlier today. IMPRESSION: 1. Lower lung volumes from prior exam with bronchovascular crowding versus vascular congestion. 2. Mild increase in left basilar atelectasis. Electronically Signed   By: Keith Rake M.D.   On: 08/16/2020 21:20   DG Chest 2  View  Result Date: 07/22/2020 CLINICAL DATA:  Ulcerative colitis EXAM: CHEST - 2 VIEW COMPARISON:  05/17/2020 FINDINGS: Post sternotomy changes with valve prosthesis and atrial appendage clip. Patchy atelectasis or scar at the lingula. No consolidation or effusion. Stable cardiomediastinal silhouette with aortic atherosclerosis. No pneumothorax. IMPRESSION: Patchy atelectasis or scar at the lingula. Electronically Signed   By: Donavan Foil M.D.   On: 07/22/2020 21:39   DG Thoracic Spine W/Swimmers  Result Date: 08/16/2020 CLINICAL DATA:  79 year old female presenting with upper back pain. EXAM: THORACIC SPINE - 3 VIEWS COMPARISON:  Chest radiograph from May 31 21, CT abdomen pelvis from 05/22/2020, and chest radiograph from 05/17/2020 FINDINGS: Interval anterior wedge compression deformity of the T7 vertebral body with associated focal kyphosis at this level. There is approximately 75% anterior vertebral body height loss. Unchanged chronic compression fractures of the L1-L2 vertebral bodies. Diffuse osteopenia. Similar appearing thoracic and abdominal aortic atherosclerotic calcifications. Median sternotomy wires in place. Left atrial appendage clip and mitral prosthesis in place. IMPRESSION: Acute compression fracture of the T7 vertebral body with approximately 75% anterior vertebral body height loss. Consider thoracic spine MRI for further characterization. Diffuse osteopenia. Aortic Atherosclerosis (ICD10-I70.0). These results were called by telephone at the time of interpretation on 08/16/2020 at 2:28 pm to provider [facility] , who verbally acknowledged these results. Electronically Signed   By: Ruthann Cancer MD   On: 08/16/2020 14:29   MR THORACIC SPINE WO CONTRAST  Result Date: 08/16/2020 CLINICAL DATA:  Mid back pain for 3 days. EXAM: MRI THORACIC SPINE WITHOUT CONTRAST TECHNIQUE: Multiplanar, multisequence MR imaging of the thoracic spine was performed. No intravenous contrast was  administered. COMPARISON:  Thoracic spine films, same date. FINDINGS: Alignment: Normal overall alignment of the thoracic vertebral bodies. Slightly exaggerated thoracic kyphosis due to the significant T7 compression  fracture. Vertebrae: T7 compression fracture appears acute or subacute. The vertebral body is significantly compressed anteriorly approximately 75%. There is mild retropulsion involving the posteroinferior aspect of the vertebral body but no significant canal stenosis. Remote compression fractures noted in the lumbar spine. The other thoracic vertebral bodies are maintained. Cord:  Normal cord signal intensity.  No cord lesions or syrinx. Paraspinal and other soft tissues: Mild paraspinal hematoma at T7. There also small pleural effusions and bibasilar atelectasis. Disc levels: No significant thoracic disc protrusions, spinal or foraminal stenosis. Mild retropulsion at T7 with mild canal narrowing at T7-8 but no significant canal stenosis or cord compression. IMPRESSION: 1. Acute or subacute T7 compression fracture with mild retropulsion involving the posteroinferior aspect of the vertebral body but no significant canal stenosis. 2. Remote compression fractures of the lumbar spine. 3. No significant thoracic disc protrusions, spinal or foraminal stenosis. Electronically Signed   By: Marijo Sanes M.D.   On: 08/16/2020 18:02    Labs:  CBC: Recent Labs    05/17/20 1229 08/16/20 1452 08/17/20 0614 08/18/20 0459  WBC 13.7* 16.4* 10.7* 6.8  HGB 8.2* 13.7 12.8 12.3  HCT 27.4* 43.6 39.9 38.4  PLT 160 157 128* 103*    COAGS: Recent Labs    08/18/20 1218  INR 1.2    BMP: Recent Labs    05/17/20 1229 08/16/20 1452 08/17/20 0614 08/18/20 0459  NA 138 138 141 138  K 4.4 4.2 3.9 3.9  CL 107 103 105 108  CO2 22 22 24 23   GLUCOSE 115* 111* 83 84  BUN 20 20 16 14   CALCIUM 7.8* 8.9 8.4* 8.1*  CREATININE 0.87 0.82 0.81 0.86  GFRNONAA >60 >60 >60 >60  GFRAA >60 >60 >60 >60     LIVER FUNCTION TESTS: Recent Labs    05/17/20 1229 08/16/20 1452  BILITOT 1.0 0.7  AST 53* 34  ALT 62* 37  ALKPHOS 37* 44  PROT 5.7* 7.1  ALBUMIN 2.8* 3.7    TUMOR MARKERS: No results for input(s): AFPTM, CEA, CA199, CHROMGRNA in the last 8760 hours.  Assessment and Plan: Acute symptomatic T7 compression fracture. Imaging reviewed with Dr. Estanislado Pandy, amenable to vertebroplasty. Will make NPO p MN. Cont to hold Xarelto and also hold Lovenox tomorrow.  Risks and benefits of T7 VP were discussed with the patient including, but not limited to education regarding the natural healing process of compression fractures without intervention, bleeding, infection, cement migration which may cause spinal cord damage, paralysis, pulmonary embolism or even death.  This interventional procedure involves the use of X-rays and because of the nature of the planned procedure, it is possible that we will have prolonged use of X-ray fluoroscopy.  Potential radiation risks to you include (but are not limited to) the following: - A slightly elevated risk for cancer  several years later in life. This risk is typically less than 0.5% percent. This risk is low in comparison to the normal incidence of human cancer, which is 33% for women and 50% for men according to the [place]. - Radiation induced injury can include skin redness, resembling a rash, tissue breakdown / ulcers and hair loss (which can be temporary or permanent).   The likelihood of either of these occurring depends on the difficulty of the procedure and whether you are sensitive to radiation due to previous procedures, disease, or genetic conditions.   IF your procedure requires a prolonged use of radiation, you will be notified and given written instructions for  further action.  It is your responsibility to monitor the irradiated area for the 2 weeks following the procedure and to notify your physician if you are  concerned that you have suffered a radiation induced injury.    All of the patient's questions were answered, patient is agreeable to proceed.  Consent signed and in chart.    Thank you for this interesting consult.  I greatly enjoyed meeting Michele Green and look forward to participating in their care.  A copy of this report was sent to the requesting provider on this date.  Electronically Signed: Ascencion Dike, PA-C 08/18/2020, 2:15 PM   I spent a total of 20 minutes in face to face in clinical consultation, greater than 50% of which was counseling/coordinating care for T7 VP

## 2020-08-18 NOTE — Progress Notes (Signed)
[place] for Heparin  Indication: atrial fibrillation  Allergies  Allergen Reactions  . Shellfish Allergy Itching    Patient Measurements: Height: 5' 4"  (162.6 cm) Weight: 55.9 kg (123 lb 3.2 oz) IBW/kg (Calculated) : 54.7  Vital Signs: Temp: 97.4 F (36.3 C) (09/27 1154) Temp Source: Oral (09/27 1154) BP: 104/65 (09/27 1154) Pulse Rate: 91 (09/27 1154)  Labs: Recent Labs    08/16/20 1452 08/16/20 1452 08/16/20 2352 08/17/20 0539 08/17/20 0614 08/18/20 0459 08/18/20 1218  HGB 13.7   < >  --   --  12.8 12.3  --   HCT 43.6  --   --   --  39.9 38.4  --   PLT 157  --   --   --  128* 103*  --   LABPROT  --   --   --   --   --   --  15.2  INR  --   --   --   --   --   --  1.2  CREATININE 0.82  --   --   --  0.81 0.86  --   TROPONINIHS 24*   < > 27* 37* 33*  --   --    < > = values in this interval not displayed.    Estimated Creatinine Clearance: 44.3 mL/min (by C-G formula based on SCr of 0.86 mg/dL).  Assessment: 79 year old female on Xarelto prior to admission admitted with T7 compression fracture with plans for IR vertebroplasty planned for 9/28  Heparin to stop at 4 am  Goal of Therapy:  Heparin level 0.3-0.7 units/ml Monitor platelets by anticoagulation protocol: Yes   Plan:  Heparin at 650 units / hr until 4 am 9/28 Follow up plans post IR  Thank you Anette Guarneri, PharmD [phone]  08/18/2020,5:06 PM

## 2020-08-18 NOTE — Progress Notes (Addendum)
PROGRESS NOTE  Michele Green ZDG:[id number] DOB: 04-12-1941 DOA: 08/16/2020 PCP: [name], MD   LOS: 1 day   Brief narrative: As per HPI, Michele Green is a 79 y.o. female with past medical history significant for breast cancer, osteoporosis, persistent A. fib on Xarelto, essential hypertension, mitral valve repair, coronary artery disease status post CABG, chronic diastolic CHF presented too [facility] ED due to severe upper back pain after rolling over in bed the night prior to her presentation.  Denied any trauma.    She was found to have an acute or subacute T7 compression fracture on MRI.  Normal cord signal intensity, no cord lesions or syrinx.  Mild paraspinal hematoma at T7.    Patient also had atrial fibrillation with RVR, mild pulmonary edema on chest x-ray, and new hypoxia for which she was transferred to [facility] for further evaluation and management.  Troponin was mildly elevated, peaked at 27.  Patient was then admitted to hospital for further evaluation and treatment.  Assessment/Plan:  Principal Problem:   Spinal fracture of T7 vertebra (HCC) Active Problems:   Essential hypertension   Persistent atrial fibrillation (HCC)   Chronic diastolic HF (heart failure) (HCC)   S/P CABG x 1   S/P Maze operation for atrial fibrillation   Atrial fibrillation with RVR (HCC)   Acute/subacute T7 compression fracture/Mild paraspinal hematoma at T7. Off anticoagulation., analgesia.  I had spoken with with neurosurgery on-call, Dr. Marcello Moores.    No neurosurgical intervention planned.  Could benefit from IR to look into the subacute fracture.  Since this is paraspinal hematoma, could anticoagulate. She would like to consider IR guided kyphoplasty.  Will request IR consultation for She does have multiple old fractures.   Patient is on chronic steroids.  She takes Prolia vitamin D and calcium supplements as outpatient.  Acute hypoxic respiratory failure secondary to mild pulmonary  edema and bibasilar atelectasis.  Oxygen was 88% on room air initially.  Continue Lasix.  Currently on 2 L of nasal cannula oxygen but  Demand ischemia.  Mild elevated troponin.  A. fib with RVR.  EKG unremarkable.  Cardiology on board.  Atrial fibrillation with RVR.status post maze procedure for atrial fibrillation.  Initially required Cardizem drip.  On metoprolol.  Dose of metoprolol has been modified to 50 twice daily.  Xarelto on hold due to paraspinous hematoma.  received one dose of amiodarone iv yesterday.  Cardiology to manage.       Leukocytosis/SIRS.    Reactive.  WBC of 6.8 today.  Chronic diastolic congestive heart failure.  History of coronary artery disease status post CABG, status post maze procedure for atrial fibrillation..  Last today echo on 09/05/2019 showed LV ejection fraction of 60 to 65% with left atrial dilatation.  Continue beta-blockers.  Intake and output charting.  Hyperlipidemia. On lipitor.  DVT prophylaxis: enoxaparin (LOVENOX) injection 40 mg Start: 08/17/20 0600 Place and maintain sequential compression device Start: 08/17/20 0302   Code Status: Full code  Family Communication: Spoke with the patient's sister at bedside at length with Status is: Inpatient  Remains inpatient appropriate because:IV treatments appropriate due to intensity of illness or inability to take PO, Inpatient level of care appropriate due to severity of illness and A. fib.  RVR, paraspinal hematoma   Dispo:  Patient From: Home  Planned Disposition: Home  Expected discharge date: 2 to 3 days  medically stable for discharge: No  Consultants:  Cardiology  Neurosurgery verbally consulted  Interventional radiology  Procedures:  None  Antibiotics:  . None  Anti-infectives (From admission, onward)   None     Subjective: Today, patient was seen and examined at bedside.  Patient did have a A. fib with RVR.  Chest pain or shortness of breath.  Complains of back pain.   Has been using Biofreeze gel.  Wishes to have her back looked into.   Objective: Vitals:   08/18/20 0028 08/18/20 0646  BP: 103/74 94/71  Pulse:  (!) 135  Resp: 20 16  Temp: 97.7 F (36.5 C) 97.9 F (36.6 C)  SpO2: 99% 100%    Intake/Output Summary (Last 24 hours) at 08/18/2020 0714 Last data filed at 08/18/2020 0650 Gross per 24 hour  Intake 360 ml  Output 300 ml  Net 60 ml   Filed Weights   08/17/20 0200 08/18/20 0028  Weight: 56 kg 55.9 kg   Body mass index is 21.15 kg/m.   Physical Exam:  GENERAL: Patient is alert awake and oriented. Not in obvious distress.  On nasal cannula oxygen HENT: No scleral pallor or icterus. Pupils equally reactive to light. Oral mucosa is moist NECK: is supple, no gross swelling noted. CHEST:   Diminished breath sounds bilaterally.  CABG scar noted.  Tenderness over the thoracic spine in the mid region. CVS: S1 and S2 heard, no murmur.  Tachycardia, irregular rhythm ABDOMEN: Soft, non-tender, bowel sounds are present. EXTREMITIES: No edema. CNS: Cranial nerves are intact. No focal motor deficits. SKIN: warm and dry without rashes.  Data Review: I have personally reviewed the following laboratory data and studies,  CBC: Recent Labs  Lab 08/16/20 1452 08/17/20 0614 08/18/20 0459  WBC 16.4* 10.7* 6.8  NEUTROABS 13.6* 8.0* 4.8  HGB 13.7 12.8 12.3  HCT 43.6 39.9 38.4  MCV 105.1* 103.1* 105.2*  PLT 157 128* 793*   Basic Metabolic Panel: Recent Labs  Lab 08/16/20 1452 08/17/20 0614 08/18/20 0459  NA 138 141 138  K 4.2 3.9 3.9  CL 103 105 108  CO2 22 24 23   GLUCOSE 111* 83 84  BUN 20 16 14   CREATININE 0.82 0.81 0.86  CALCIUM 8.9 8.4* 8.1*   Liver Function Tests: Recent Labs  Lab 08/16/20 1452  AST 34  ALT 37  ALKPHOS 44  BILITOT 0.7  PROT 7.1  ALBUMIN 3.7   No results for input(s): LIPASE, AMYLASE in the last 168 hours. No results for input(s): AMMONIA in the last 168 hours. Cardiac Enzymes: No results for  input(s): CKTOTAL, CKMB, CKMBINDEX, TROPONINI in the last 168 hours. BNP (last 3 results) Recent Labs    05/17/20 1230 08/16/20 1452  BNP 237.4* 482.0*    ProBNP (last 3 results) No results for input(s): PROBNP in the last 8760 hours.  CBG: Recent Labs  Lab 08/17/20 0100 08/17/20 1710  GLUCAP 92 91   Recent Results (from the past 240 hour(s))  Respiratory Panel by RT PCR (Flu A&B, Covid) - Nasopharyngeal Swab     Status: None   Collection Time: 08/16/20  7:24 PM   Specimen: Nasopharyngeal Swab  Result Value Ref Range Status   SARS Coronavirus 2 by RT PCR NEGATIVE NEGATIVE Final    Comment: (NOTE) SARS-CoV-2 target nucleic acids are NOT DETECTED.  The SARS-CoV-2 RNA is generally detectable in upper respiratoy specimens during the acute phase of infection. The lowest concentration of SARS-CoV-2 viral copies this assay can detect is 131 copies/mL. A negative result does not preclude SARS-Cov-2 infection and should not be used as the  sole basis for treatment or other patient management decisions. A negative result may occur with  improper specimen collection/handling, submission of specimen other than nasopharyngeal swab, presence of viral mutation(s) within the areas targeted by this assay, and inadequate number of viral copies (<131 copies/mL). A negative result must be combined with clinical observations, patient history, and epidemiological information. The expected result is Negative.  Fact Sheet for Patients:  PinkCheek.be  Fact Sheet for Healthcare Providers:  GravelBags.it  This test is no t yet approved or cleared by the [place] FDA and  has been authorized for detection and/or diagnosis of SARS-CoV-2 by FDA under an Emergency Use Authorization (EUA). This EUA will remain  in effect (meaning this test can be used) for the duration of the COVID-19 declaration under Section 564(b)(1) of the Act, 21  U.S.C. section 360bbb-3(b)(1), unless the authorization is terminated or revoked sooner.     Influenza A by PCR NEGATIVE NEGATIVE Final   Influenza B by PCR NEGATIVE NEGATIVE Final    Comment: (NOTE) The Xpert Xpress SARS-CoV-2/FLU/RSV assay is intended as an aid in  the diagnosis of influenza from Nasopharyngeal swab specimens and  should not be used as a sole basis for treatment. Nasal washings and  aspirates are unacceptable for Xpert Xpress SARS-CoV-2/FLU/RSV  testing.  Fact Sheet for Patients: PinkCheek.be  Fact Sheet for Healthcare Providers: GravelBags.it  This test is not yet approved or cleared by the [place] FDA and  has been authorized for detection and/or diagnosis of SARS-CoV-2 by  FDA under an Emergency Use Authorization (EUA). This EUA will remain  in effect (meaning this test can be used) for the duration of the  Covid-19 declaration under Section 564(b)(1) of the Act, 21  U.S.C. section 360bbb-3(b)(1), unless the authorization is  terminated or revoked. Performed at [facility], [address] [place], [place] [address]   MRSA PCR Screening     Status: None   Collection Time: 08/17/20  4:36 AM   Specimen: Nasal Mucosa; Nasopharyngeal  Result Value Ref Range Status   MRSA by PCR NEGATIVE NEGATIVE Final    Comment:        The GeneXpert MRSA Assay (FDA approved for NASAL specimens only), is one component of a comprehensive MRSA colonization surveillance program. It is not intended to diagnose MRSA infection nor to guide or monitor treatment for MRSA infections. Performed at [facility], [place] [address], [place], [place] [address]   Culture, blood (routine x 2)     Status: None (Preliminary result)   Collection Time: 08/17/20  6:14 AM   Specimen: BLOOD RIGHT HAND  Result Value Ref Range Status   Specimen Description BLOOD RIGHT HAND  Final   Special Requests   Final     BOTTLES DRAWN AEROBIC ONLY Blood Culture results may not be optimal due to an inadequate volume of blood received in culture bottles   Culture   Final    NO GROWTH < 24 HOURS Performed at [facility], [place] [address] [place], [place] [address]    Report Status PENDING  Incomplete  Culture, blood (routine x 2)     Status: None (Preliminary result)   Collection Time: 08/17/20  6:25 AM   Specimen: BLOOD RIGHT HAND  Result Value Ref Range Status   Specimen Description BLOOD RIGHT HAND  Final   Special Requests   Final    BOTTLES DRAWN AEROBIC ONLY Blood Culture results may not be optimal due  to an inadequate volume of blood received in culture bottles   Culture   Final    NO GROWTH < 24 HOURS Performed at [facility] [address], [place], [place] [address]    Report Status PENDING  Incomplete     Studies: DG Chest 2 View  Result Date: 08/16/2020 CLINICAL DATA:  Hypoxia.  Mid back pain. EXAM: CHEST - 2 VIEW COMPARISON:  Radiograph 07/22/2020 FINDINGS: Lower lung volumes from prior exam. Post median sternotomy. CABG with left atrial clipping. Prosthetic cardiac valve. Similar cardiomegaly to prior exam. Unchanged mediastinal contours. Mild increase in left basilar atelectasis from prior exam. Bronchovascular crowding versus vascular congestion. No pneumothorax or pleural effusion. Midthoracic vertebral compression which was assessed on radiograph and MRI earlier today. IMPRESSION: 1. Lower lung volumes from prior exam with bronchovascular crowding versus vascular congestion. 2. Mild increase in left basilar atelectasis. Electronically Signed   By: Keith Rake M.D.   On: 08/16/2020 21:20   DG Thoracic Spine W/Swimmers  Result Date: 08/16/2020 CLINICAL DATA:  79 year old female presenting with upper back pain. EXAM: THORACIC SPINE - 3 VIEWS COMPARISON:  Chest radiograph from May 31 21, CT abdomen pelvis from 05/22/2020, and chest radiograph from 05/17/2020 FINDINGS: Interval  anterior wedge compression deformity of the T7 vertebral body with associated focal kyphosis at this level. There is approximately 75% anterior vertebral body height loss. Unchanged chronic compression fractures of the L1-L2 vertebral bodies. Diffuse osteopenia. Similar appearing thoracic and abdominal aortic atherosclerotic calcifications. Median sternotomy wires in place. Left atrial appendage clip and mitral prosthesis in place. IMPRESSION: Acute compression fracture of the T7 vertebral body with approximately 75% anterior vertebral body height loss. Consider thoracic spine MRI for further characterization. Diffuse osteopenia. Aortic Atherosclerosis (ICD10-I70.0). These results were called by telephone at the time of interpretation on 08/16/2020 at 2:28 pm to provider [facility] , who verbally acknowledged these results. Electronically Signed   By: Ruthann Cancer MD   On: 08/16/2020 14:29   MR THORACIC SPINE WO CONTRAST  Result Date: 08/16/2020 CLINICAL DATA:  Mid back pain for 3 days. EXAM: MRI THORACIC SPINE WITHOUT CONTRAST TECHNIQUE: Multiplanar, multisequence MR imaging of the thoracic spine was performed. No intravenous contrast was administered. COMPARISON:  Thoracic spine films, same date. FINDINGS: Alignment: Normal overall alignment of the thoracic vertebral bodies. Slightly exaggerated thoracic kyphosis due to the significant T7 compression fracture. Vertebrae: T7 compression fracture appears acute or subacute. The vertebral body is significantly compressed anteriorly approximately 75%. There is mild retropulsion involving the posteroinferior aspect of the vertebral body but no significant canal stenosis. Remote compression fractures noted in the lumbar spine. The other thoracic vertebral bodies are maintained. Cord:  Normal cord signal intensity.  No cord lesions or syrinx. Paraspinal and other soft tissues: Mild paraspinal hematoma at T7. There also small pleural effusions and bibasilar  atelectasis. Disc levels: No significant thoracic disc protrusions, spinal or foraminal stenosis. Mild retropulsion at T7 with mild canal narrowing at T7-8 but no significant canal stenosis or cord compression. IMPRESSION: 1. Acute or subacute T7 compression fracture with mild retropulsion involving the posteroinferior aspect of the vertebral body but no significant canal stenosis. 2. Remote compression fractures of the lumbar spine. 3. No significant thoracic disc protrusions, spinal or foraminal stenosis. Electronically Signed   By: Marijo Sanes M.D.   On: 08/16/2020 18:02      Flora Lipps, MD  [facility] 08/18/2020

## 2020-08-18 NOTE — Progress Notes (Signed)
0730 Received pt with a red MEWS due to increase HR. V/s monitored, meds given, seen by Cards and attending MD with orders.

## 2020-08-18 NOTE — Evaluation (Signed)
Occupational Therapy Evaluation Patient Details Name: Michele Green MRN: [id number] DOB: 22-Oct-1941 Today's Date: 08/18/2020    History of Present Illness Pt is an 79 y.o. female admitted 08/16/20 with svere upper back pain after rolling over in bed the night before admission. MRI with acute or subacute T7 compression fx, mild paraspinal hematoma. Complicated by afib with RVR, mild pulmonary edema, new hypoxia. Neurosx recommending outpatient follow up for T7 fx. PMH includes breast CA, osteoporosis, persistent afib on Xarelto, HTN, MVR, CAD s/p CABG, CHF.   Clinical Impression   Patient was admitted with the above diagnosis.  Currently back pain is limiting her ability to participate fully with self care, toilet skills and functional mobility.  Prior to this hospitalization she was living alone, driving short distances, used a [id number] for mobility, and was able to care for herself.  OT will continue to follow in the acute setting.  Of note, she had improved mentation this date compared to her PT eval, and is moving better despite continued pain.  Given her lack of assistance at home, SNF level referral is appropriate.       Follow Up Recommendations  SNF    Equipment Recommendations  None recommended by OT    Recommendations for Other Services       Precautions / Restrictions Precautions Precautions: Back;Other (comment) Precaution Booklet Issued: No Required Braces or Orthoses: Spinal Brace Spinal Brace: Thoracolumbosacral orthotic;Applied in sitting position Restrictions Weight Bearing Restrictions: No      Mobility Bed Mobility Overal bed mobility: Needs Assistance   Rolling: Supervision Sidelying to sit: Min assist;HOB elevated          Transfers Overall transfer level: Needs assistance   Transfers: Sit to/from Stand;Stand Pivot Transfers Sit to Stand: Min guard Stand pivot transfers: Min guard       General transfer comment: cues not pull up on RW.  Patient was  able to place the brace in sitting.    Balance Overall balance assessment: Needs assistance Sitting-balance support: No upper extremity supported Sitting balance-Leahy Scale: Good     Standing balance support: Bilateral upper extremity supported Standing balance-Leahy Scale: Fair                             ADL either performed or assessed with clinical judgement   ADL Overall ADL's : Needs assistance/impaired Eating/Feeding: Independent;Bed level   Grooming: Wash/dry hands;Wash/dry face;Set up;Sitting   Upper Body Bathing: Minimal assistance;Sitting   Lower Body Bathing: Moderate assistance;Sit to/from stand   Upper Body Dressing : Minimal assistance;Sitting   Lower Body Dressing: Moderate assistance;Sit to/from stand               Functional mobility during ADLs: Minimal assistance General ADL Comments: able to perform sit to stand times two.  Walked with RW 5' up and backwards - purewick in place.  Patient will not remove due to incontinence.     Vision Baseline Vision/History: Wears glasses Wears Glasses: At all times Patient Visual Report: No change from baseline       Perception     Praxis      Pertinent Vitals/Pain Pain Assessment: 0-10 Pain Score: 5  Pain Location: Back Pain Descriptors / Indicators: Discomfort;Guarding Pain Intervention(s): Monitored during session;Repositioned     Hand Dominance Right   Extremity/Trunk Assessment Upper Extremity Assessment Upper Extremity Assessment: Overall WFL for tasks assessed   Lower Extremity Assessment Lower Extremity Assessment: Defer to PT evaluation  Cervical / Trunk Assessment Cervical / Trunk Assessment: Normal   Communication Communication Communication: No difficulties   Cognition Arousal/Alertness: Awake/alert Behavior During Therapy: WFL for tasks assessed/performed Overall Cognitive Status: Within Functional Limits for tasks assessed                                  General Comments: Improved mentation noted this date.  ? meds for PT eval.   General Comments       Exercises     Shoulder Instructions      Home Living Family/patient expects to be discharged to:: Skilled nursing facility Living Arrangements: Alone Available Help at Discharge: Family;Available PRN/intermittently Type of Home: House Home Access: Stairs to enter     Home Layout: Two level;Able to live on main level with bedroom/bathroom     Bathroom Shower/Tub: Walk-in shower   Bathroom Toilet: Handicapped height     Home Equipment: Environmental consultant - 2 wheels;Grab bars - tub/shower;Shower seat;Walker - 4 wheels;Adaptive equipment   Additional Comments: Sister lives nearby but pt reports not able to provide assist      Prior Functioning/Environment Level of Independence: Independent with assistive device(s)        Comments: Lives alone. Mod indep with intermittent use of rollator        OT Problem List: Decreased strength;Decreased activity tolerance;Impaired balance (sitting and/or standing);Pain      OT Treatment/Interventions: Self-care/ADL training;Therapeutic exercise;Therapeutic activities;Balance training    OT Goals(Current goals can be found in the care plan section) Acute Rehab OT Goals Patient Stated Goal: I have to go to rehab, this pain is too much. OT Goal Formulation: With patient Time For Goal Achievement: 09/01/20 Potential to Achieve Goals: Good ADL Goals Pt Will Perform Lower Body Bathing: with modified independence;sit to/from stand;with adaptive equipment Pt Will Perform Lower Body Dressing: with modified independence;sit to/from stand;with adaptive equipment Pt Will Transfer to Toilet: with modified independence;ambulating Pt/caregiver will Perform Home Exercise Program: Increased strength;With theraband;With written HEP provided  OT Frequency: Min 2X/week   Barriers to D/C: Decreased caregiver support          Co-evaluation               AM-PAC OT "6 Clicks" Daily Activity     Outcome Measure Help from another person eating meals?: None Help from another person taking care of personal grooming?: A Little Help from another person toileting, which includes using toliet, bedpan, or urinal?: A Little Help from another person bathing (including washing, rinsing, drying)?: A Lot Help from another person to put on and taking off regular upper body clothing?: A Little Help from another person to put on and taking off regular lower body clothing?: A Lot 6 Click Score: 17   End of Session Equipment Utilized During Treatment: Rolling walker;Other (comment) (TLSO) Nurse Communication: Other (comment) (position of patient)  Activity Tolerance: Patient tolerated treatment well Patient left: in bed;with call bell/phone within reach  OT Visit Diagnosis: Unsteadiness on feet (R26.81);Other abnormalities of gait and mobility (R26.89);Muscle weakness (generalized) (M62.81);History of falling (Z91.81);Dizziness and giddiness (R42);Pain Pain - part of body:  (Back)                Time: 2353-6144 OT Time Calculation (min): 25 min Charges:  OT General Charges $OT Visit: 1 Visit OT Evaluation $OT Eval Moderate Complexity: 1 Mod OT Treatments $Therapeutic Activity: 8-22 mins  08/18/2020  Rich, OTR/L  Acute Rehabilitation Services  Office:  [phone]   Metta Clines 08/18/2020, 9:55 AM

## 2020-08-18 NOTE — Progress Notes (Signed)
CSW met with patient at bedside, current offers discussed. Patient stated she does not want to go to [facility] or [facility]. Patient expressed the need to be close to her sister and neighbors. Patient then asked about availability of a SNF in [facility] [place] advised she wasn't sure of that SNF but would research. CSW went over [place] offer and provided medicare.gov ratings list. Patient stated her sister and son would make the final determination on where she is placed. CSW advised she would follow up with patient tomorrow.

## 2020-08-18 NOTE — Progress Notes (Addendum)
Pt's HR from 120's -150's , non sustained, MD in the unit, made aware, no new orders.

## 2020-08-18 NOTE — Progress Notes (Signed)
Pt's HR sustaining to 150's, Cards paged, awaiting to call back.

## 2020-08-18 NOTE — Progress Notes (Signed)
   08/18/20 2007  Assess: MEWS Score  Temp 97.6 F (36.4 C)  BP (!) 83/65  Pulse Rate (!) 116  Resp 18  SpO2 99 %  Assess: MEWS Score  MEWS Temp 0  MEWS Systolic 1  MEWS Pulse 2  MEWS RR 0  MEWS LOC 0  MEWS Score 3  MEWS Score Color Yellow  Assess: if the MEWS score is Yellow or Red  Were vital signs taken at a resting state? Yes  Focused Assessment No change from prior assessment  Early Detection of Sepsis Score *See Row Information* Low  MEWS guidelines implemented *See Row Information* No, previously yellow, continue vital signs every 4 hours  Treat  MEWS Interventions Administered scheduled meds/treatments  Pain Scale 0-10  Pain Score 0  Pain Type Acute pain

## 2020-08-18 NOTE — Plan of Care (Signed)
  Problem: Education: Goal: Knowledge of disease or condition will improve Outcome: Progressing Goal: Understanding of medication regimen will improve Outcome: Progressing Goal: Individualized Educational Video(s) Outcome: Progressing

## 2020-08-18 NOTE — Progress Notes (Signed)
Progress Note  Patient Name: Michele Green Date of Encounter: 08/18/2020  [facility] Cardiologist: Sinclair Grooms, MD   Subjective   Reports intermittent back pain  Inpatient Medications    Scheduled Meds: . allopurinol  200 mg Oral QPM  . atorvastatin  20 mg Oral Daily  . enoxaparin (LOVENOX) injection  40 mg Subcutaneous Q24H  . mesalamine  1,500 mg Oral Daily  . metoprolol tartrate  50 mg Oral BID  . potassium chloride SA  20 mEq Oral Daily  . senna-docusate  2 tablet Oral BID  . tamoxifen  20 mg Oral Daily   Continuous Infusions:  PRN Meds: acetaminophen, furosemide, HYDROmorphone (DILAUDID) injection, oxyCODONE   Vital Signs    Vitals:   08/18/20 0646 08/18/20 0802 08/18/20 0803 08/18/20 0815  BP: 94/71 98/64 (!) 97/55 95/78  Pulse: (!) 135 60 (!) 55 77  Resp: 16     Temp: 97.9 F (36.6 C)     TempSrc: Oral     SpO2: 100% 100% 100% 100%  Weight:      Height:        Intake/Output Summary (Last 24 hours) at 08/18/2020 0831 Last data filed at 08/18/2020 0802 Gross per 24 hour  Intake 580 ml  Output 300 ml  Net 280 ml   Last 3 Weights 08/18/2020 08/17/2020 08/14/2020  Weight (lbs) 123 lb 3.2 oz 123 lb 7.3 oz 126 lb 6.4 oz  Weight (kg) 55.883 kg 56 kg 57.335 kg      Telemetry    Atrial flutter with variable conduction, yesterday was in 4-1 conduction with rates around 80, yesterday evening went up to 2-1 conduction with rates 160, since then has been variable conduction with rates 100s to 140s.- Personally Reviewed  ECG    No new EKG- Personally Reviewed  Physical Exam   GEN: No acute distress.   Neck: No JVD Cardiac: irregular, tachycardic, no murmurs Respiratory: Clear to auscultation bilaterally. GI: Soft, nontender MS: No edema Neuro:  Nonfocal  Psych: Normal affect   Labs    High Sensitivity Troponin:   Recent Labs  Lab 08/16/20 1452 08/16/20 2352 08/17/20 0539 08/17/20 0614  TROPONINIHS 24* 27* 37* 33*       Chemistry Recent Labs  Lab 08/16/20 1452 08/17/20 0614 08/18/20 0459  NA 138 141 138  K 4.2 3.9 3.9  CL 103 105 108  CO2 22 24 23   GLUCOSE 111* 83 84  BUN 20 16 14   CREATININE 0.82 0.81 0.86  CALCIUM 8.9 8.4* 8.1*  PROT 7.1  --   --   ALBUMIN 3.7  --   --   AST 34  --   --   ALT 37  --   --   ALKPHOS 44  --   --   BILITOT 0.7  --   --   GFRNONAA >60 >60 >60  GFRAA >60 >60 >60  ANIONGAP 13 12 7      Hematology Recent Labs  Lab 08/16/20 1452 08/17/20 0614 08/18/20 0459  WBC 16.4* 10.7* 6.8  RBC 4.15 3.87 3.65*  HGB 13.7 12.8 12.3  HCT 43.6 39.9 38.4  MCV 105.1* 103.1* 105.2*  MCH 33.0 33.1 33.7  MCHC 31.4 32.1 32.0  RDW 16.3* 15.9* 15.4  PLT 157 128* 103*    BNP Recent Labs  Lab 08/16/20 1452  BNP 482.0*     DDimer No results for input(s): DDIMER in the last 168 hours.   Radiology    DG  Chest 2 View  Result Date: 08/16/2020 CLINICAL DATA:  Hypoxia.  Mid back pain. EXAM: CHEST - 2 VIEW COMPARISON:  Radiograph 07/22/2020 FINDINGS: Lower lung volumes from prior exam. Post median sternotomy. CABG with left atrial clipping. Prosthetic cardiac valve. Similar cardiomegaly to prior exam. Unchanged mediastinal contours. Mild increase in left basilar atelectasis from prior exam. Bronchovascular crowding versus vascular congestion. No pneumothorax or pleural effusion. Midthoracic vertebral compression which was assessed on radiograph and MRI earlier today. IMPRESSION: 1. Lower lung volumes from prior exam with bronchovascular crowding versus vascular congestion. 2. Mild increase in left basilar atelectasis. Electronically Signed   By: Keith Rake M.D.   On: 08/16/2020 21:20   DG Thoracic Spine W/Swimmers  Result Date: 08/16/2020 CLINICAL DATA:  79 year old female presenting with upper back pain. EXAM: THORACIC SPINE - 3 VIEWS COMPARISON:  Chest radiograph from May 31 21, CT abdomen pelvis from 05/22/2020, and chest radiograph from 05/17/2020 FINDINGS: Interval  anterior wedge compression deformity of the T7 vertebral body with associated focal kyphosis at this level. There is approximately 75% anterior vertebral body height loss. Unchanged chronic compression fractures of the L1-L2 vertebral bodies. Diffuse osteopenia. Similar appearing thoracic and abdominal aortic atherosclerotic calcifications. Median sternotomy wires in place. Left atrial appendage clip and mitral prosthesis in place. IMPRESSION: Acute compression fracture of the T7 vertebral body with approximately 75% anterior vertebral body height loss. Consider thoracic spine MRI for further characterization. Diffuse osteopenia. Aortic Atherosclerosis (ICD10-I70.0). These results were called by telephone at the time of interpretation on 08/16/2020 at 2:28 pm to provider [facility] , who verbally acknowledged these results. Electronically Signed   By: Ruthann Cancer MD   On: 08/16/2020 14:29   MR THORACIC SPINE WO CONTRAST  Result Date: 08/16/2020 CLINICAL DATA:  Mid back pain for 3 days. EXAM: MRI THORACIC SPINE WITHOUT CONTRAST TECHNIQUE: Multiplanar, multisequence MR imaging of the thoracic spine was performed. No intravenous contrast was administered. COMPARISON:  Thoracic spine films, same date. FINDINGS: Alignment: Normal overall alignment of the thoracic vertebral bodies. Slightly exaggerated thoracic kyphosis due to the significant T7 compression fracture. Vertebrae: T7 compression fracture appears acute or subacute. The vertebral body is significantly compressed anteriorly approximately 75%. There is mild retropulsion involving the posteroinferior aspect of the vertebral body but no significant canal stenosis. Remote compression fractures noted in the lumbar spine. The other thoracic vertebral bodies are maintained. Cord:  Normal cord signal intensity.  No cord lesions or syrinx. Paraspinal and other soft tissues: Mild paraspinal hematoma at T7. There also small pleural effusions and bibasilar  atelectasis. Disc levels: No significant thoracic disc protrusions, spinal or foraminal stenosis. Mild retropulsion at T7 with mild canal narrowing at T7-8 but no significant canal stenosis or cord compression. IMPRESSION: 1. Acute or subacute T7 compression fracture with mild retropulsion involving the posteroinferior aspect of the vertebral body but no significant canal stenosis. 2. Remote compression fractures of the lumbar spine. 3. No significant thoracic disc protrusions, spinal or foraminal stenosis. Electronically Signed   By: Marijo Sanes M.D.   On: 08/16/2020 18:02    Cardiac Studies   Echo 09/05/19: 1. Left ventricular ejection fraction, by visual estimation, is 60 to  65%. The left ventricle has normal function. There is moderately increased  left ventricular hypertrophy.  2. Global right ventricle has moderately reduced systolic function.The  right ventricular size is normal. No increase in right ventricular wall  thickness.  3. Left atrial size was severely dilated.  4. Right atrial size was normal.  5. The mitral valve has been repaired/replaced. Mild mitral valve  regurgitation. Mild-moderate mitral stenosis.  6. MV peak gradient, 19.4 mmHg. HR 117 during this study.  7. The tricuspid valve is grossly normal. Tricuspid valve regurgitation  is mild.  8. The aortic valve is tricuspid Aortic valve regurgitation was not  visualized by color flow Doppler.  9. The pulmonic valve was grossly normal. Pulmonic valve regurgitation is  trivial by color flow Doppler.  10. Moderately elevated pulmonary artery systolic pressure.  11. The inferior vena cava is dilated in size with <50% respiratory  variability, suggesting right atrial pressure of 15 mmHg.   Patient Profile     79 y.o. female with a hx of CAD s/p CABG with LIMA-LAD and severe MR s/p repair September 2019, breast cancer, HTN, afib prior to open heart surgery, Maze procedure without atrial appendage ligation  September 2019, recurrent Afib/flutter post surgery despite amiodarone therapy and cardioversion November 2019 with plan to rate control who is being seen today for the evaluation of anticoagulation in the setting of spinal hematoma at the request of Dr. Louanne Belton.  Assessment & Plan    Persistent atypical Afib/flutter: She has failed multiple therapies including Maze procedure and cardioversion. Not a ablation candidate. LA severely dilated.  Currently in AFL with RVR - CHADSVASC of 5.  On Xarelto >>held for spinal hematoma.  Restart once OK with neurosurgery - Increase metoprolol to 75 mg BID for rate control  T7 compression fracture with mild paraspinal hematoma: anticoagulation held as above - Restart AC once OK per neurosurgery  Elevated troponin with known CAD s/p CABG:HS troponin 37>33.  Denies chest pain - likely demand ischemia in the setting of afib RVR - continue statin and BB  Chronic diastolic CHF: BNP 585, imaging with mild pulmonary edema - Last echo showed preserved EF - Home lasix 20 mg restarted  Acute hypoxic respiratory failure: 2L O2 - home lasix restarted   For questions or updates, please contact [facility] Please consult [email or web address] for contact info under        Signed, Donato Heinz, MD  08/18/2020, 8:31 AM

## 2020-08-18 NOTE — Progress Notes (Signed)
Physical Therapy Treatment Patient Details Name: Michele Green MRN: [id number] DOB: 01/09/1941 Today's Date: 08/18/2020    History of Present Illness Pt is an 79 y.o. female admitted 08/16/20 with svere upper back pain after rolling over in bed the night before admission. MRI with acute or subacute T7 compression fx, mild paraspinal hematoma. Complicated by afib with RVR, mild pulmonary edema, new hypoxia. Neurosx recommending outpatient follow up for T7 fx. PMH includes breast CA, osteoporosis, persistent afib on Xarelto, HTN, MVR, CAD s/p CABG, CHF.    PT Comments    Pt participated in session; pt with increased questions regarding transitioning to SNF, therapist providing education regarding performing exercises during down time and asking staff to assist with mobility to be OOB as much as possible; pt limited during session due to pain; pt unable to perform second trials of ambulation due to pain; pt continues to demonstrate deficits in balance, strength, coordination, endurance and gait and will benefit from skilled PT to address deficits to maximize independence with functional mobility prior to discharge.    Follow Up Recommendations  SNF     Equipment Recommendations       Recommendations for Other Services       Precautions / Restrictions Precautions Precautions: Back;Other (comment) Precaution Booklet Issued: No Precaution Comments: Watch HR/O2 Required Braces or Orthoses: Spinal Brace Spinal Brace: Thoracolumbosacral orthotic;Applied in sitting position Restrictions Weight Bearing Restrictions: No    Mobility  Bed Mobility Overal bed mobility: Needs Assistance Bed Mobility: Rolling;Sidelying to Sit Rolling: Supervision Sidelying to sit: Min assist;HOB elevated       General bed mobility comments: continued education and demonstration on log roll technique; pt requiring HOB elevated to assist with movement as well as for pain management; pt verbalized  understanding of log roll, but unable to attempt due to pain  Transfers Overall transfer level: Needs assistance Equipment used: Rolling walker (2 wheeled) Transfers: Sit to/from [facility] Sit to Stand: Min guard Stand pivot transfers: Min guard       General transfer comment: pt able to don/doff brace sitting SOB: pt able to correctly perform sit<>stand wtih RW  Ambulation/Gait Ambulation/Gait assistance: Min assist Gait Distance (Feet): 18 Feet Assistive device: Rolling walker (2 wheeled)   Gait velocity: Decreased   General Gait Details: decreased velocity; verbal cueing needed for safety with RW negotiation, forward flexed posture   Stairs             Wheelchair Mobility    Modified Rankin (Stroke Patients Only)       Balance Overall balance assessment: Needs assistance Sitting-balance support: No upper extremity supported;Feet supported Sitting balance-Leahy Scale: Good     Standing balance support: Bilateral upper extremity supported Standing balance-Leahy Scale: Fair Standing balance comment: reliant on UE support                            Cognition Arousal/Alertness: Awake/alert Behavior During Therapy: WFL for tasks assessed/performed Overall Cognitive Status: Within Functional Limits for tasks assessed                                 General Comments: Improved mentation noted this date.  ? meds for PT eval.      Exercises      General Comments General comments (skin integrity, edema, etc.): pt HR between 55-80 during session      Pertinent Vitals/Pain  Pain Assessment: 0-10 Pain Score: 4  Pain Location: Back Pain Descriptors / Indicators: Discomfort;Guarding Pain Intervention(s): Repositioned;Heat applied    Home Living Family/patient expects to be discharged to:: Skilled nursing facility Living Arrangements: Alone Available Help at Discharge: Family;Available PRN/intermittently Type of  Home: House Home Access: Stairs to enter   Home Layout: Two level;Able to live on main level with bedroom/bathroom Home Equipment: Walker - 2 wheels;Grab bars - tub/shower;Shower seat;Walker - 4 wheels;Adaptive equipment Additional Comments: Sister lives nearby but pt reports not able to provide assist    Prior Function Level of Independence: Independent with assistive device(s)      Comments: Lives alone. Mod indep with intermittent use of rollator   PT Goals (current goals can now be found in the care plan section) Acute Rehab PT Goals Patient Stated Goal: I have to go to rehab, this pain is too much. PT Goal Formulation: With patient Time For Goal Achievement: 08/31/20 Potential to Achieve Goals: Fair Progress towards PT goals: Progressing toward goals    Frequency    Min 3X/week (pt has decided to go to SNF for additional rehab)      PT Plan Frequency needs to be updated    Co-evaluation              AM-PAC PT "6 Clicks" Mobility   Outcome Measure  Help needed turning from your back to your side while in a flat bed without using bedrails?: A Little Help needed moving from lying on your back to sitting on the side of a flat bed without using bedrails?: A Little Help needed moving to and from a bed to a chair (including a wheelchair)?: A Little Help needed standing up from a chair using your arms (e.g., wheelchair or bedside chair)?: A Little Help needed to walk in hospital room?: A Little Help needed climbing 3-5 steps with a railing? : A Lot 6 Click Score: 17    End of Session Equipment Utilized During Treatment: Gait belt;Back brace Activity Tolerance: Patient tolerated treatment well;Patient limited by pain;Patient limited by fatigue Patient left: in chair;with call bell/phone within reach;with chair alarm set Nurse Communication: Mobility status PT Visit Diagnosis: Other abnormalities of gait and mobility (R26.89);Muscle weakness (generalized) (M62.81)      Time: 0973-5329 PT Time Calculation (min) (ACUTE ONLY): 27 min  Charges:  $Gait Training: 8-22 mins $Therapeutic Activity: 8-22 mins                     Lyanne Co, DPT Acute Rehabilitation Services [phone]   [place] 08/18/2020, 12:32 PM

## 2020-08-18 NOTE — Progress Notes (Signed)
HR has been in the 120-130's. Pt stated that she is very anxious of the procedure for tomorrow.  MD made aware. MEWS score 2.

## 2020-08-19 ENCOUNTER — Other Ambulatory Visit: Payer: Self-pay | Admitting: Hematology

## 2020-08-19 ENCOUNTER — Inpatient Hospital Stay (HOSPITAL_COMMUNITY): Payer: Medicare Other

## 2020-08-19 DIAGNOSIS — Z170 Estrogen receptor positive status [ER+]: Secondary | ICD-10-CM

## 2020-08-19 DIAGNOSIS — S22069A Unspecified fracture of T7-T8 vertebra, initial encounter for closed fracture: Secondary | ICD-10-CM

## 2020-08-19 HISTORY — PX: IR VERTEBROPLASTY CERV/THOR BX INC UNI/BIL INC/INJECT/IMAGING: IMG5515

## 2020-08-19 LAB — CBC WITH DIFFERENTIAL/PLATELET
Abs Immature Granulocytes: 0.08 10*3/uL — ABNORMAL HIGH (ref 0.00–0.07)
Basophils Absolute: 0 10*3/uL (ref 0.0–0.1)
Basophils Relative: 0 %
Eosinophils Absolute: 0.1 10*3/uL (ref 0.0–0.5)
Eosinophils Relative: 1 %
HCT: 38.3 % (ref 36.0–46.0)
Hemoglobin: 12.1 g/dL (ref 12.0–15.0)
Immature Granulocytes: 1 %
Lymphocytes Relative: 24 %
Lymphs Abs: 1.5 10*3/uL (ref 0.7–4.0)
MCH: 33.4 pg (ref 26.0–34.0)
MCHC: 31.6 g/dL (ref 30.0–36.0)
MCV: 105.8 fL — ABNORMAL HIGH (ref 80.0–100.0)
Monocytes Absolute: 0.5 10*3/uL (ref 0.1–1.0)
Monocytes Relative: 8 %
Neutro Abs: 4.1 10*3/uL (ref 1.7–7.7)
Neutrophils Relative %: 66 %
Platelets: 103 10*3/uL — ABNORMAL LOW (ref 150–400)
RBC: 3.62 MIL/uL — ABNORMAL LOW (ref 3.87–5.11)
RDW: 15.2 % (ref 11.5–15.5)
WBC: 6.2 10*3/uL (ref 4.0–10.5)
nRBC: 0 % (ref 0.0–0.2)

## 2020-08-19 LAB — BASIC METABOLIC PANEL
Anion gap: 9 (ref 5–15)
BUN: 10 mg/dL (ref 8–23)
CO2: 25 mmol/L (ref 22–32)
Calcium: 8.2 mg/dL — ABNORMAL LOW (ref 8.9–10.3)
Chloride: 105 mmol/L (ref 98–111)
Creatinine, Ser: 0.73 mg/dL (ref 0.44–1.00)
GFR calc Af Amer: >60 mL/min (ref >60)
GFR calc non Af Amer: >60 mL/min (ref >60)
Glucose, Bld: 81 mg/dL (ref 70–99)
Potassium: 3.7 mmol/L (ref 3.5–5.1)
Sodium: 139 mmol/L (ref 135–145)

## 2020-08-19 LAB — MAGNESIUM: Magnesium: 1.8 mg/dL (ref 1.7–2.4)

## 2020-08-19 MED ORDER — HEPARIN (PORCINE) 25000 UT/250ML-% IV SOLN
650.0000 [IU]/h | INTRAVENOUS | Status: AC
  Administered 2020-08-19: 650 [IU]/h via INTRAVENOUS
  Filled 2020-08-19: qty 250

## 2020-08-19 MED ORDER — SODIUM CHLORIDE 0.9 % IV SOLN: INTRAVENOUS | Status: AC

## 2020-08-19 MED ORDER — CEFAZOLIN SODIUM-DEXTROSE 2-4 GM/100ML-% IV SOLN
INTRAVENOUS | Status: AC
  Filled 2020-08-19: qty 100

## 2020-08-19 MED ORDER — TOBRAMYCIN SULFATE 1.2 G IJ SOLR
INTRAMUSCULAR | Status: AC
  Filled 2020-08-19: qty 1.2

## 2020-08-19 MED ORDER — MIDAZOLAM HCL 2 MG/2ML IJ SOLN
INTRAMUSCULAR | Status: AC | PRN
  Administered 2020-08-19: 1 mg via INTRAVENOUS

## 2020-08-19 MED ORDER — BUPIVACAINE HCL (PF) 0.5 % IJ SOLN
INTRAMUSCULAR | Status: AC
  Filled 2020-08-19: qty 30

## 2020-08-19 MED ORDER — ENSURE ENLIVE PO LIQD
237.0000 mL | Freq: Two times a day (BID) | ORAL | Status: DC
  Administered 2020-08-20 – 2020-08-23 (×5): 237 mL via ORAL

## 2020-08-19 MED ORDER — METOPROLOL SUCCINATE ER 100 MG PO TB24
100.0000 mg | ORAL_TABLET | Freq: Two times a day (BID) | ORAL | Status: DC
  Administered 2020-08-19: 100 mg via ORAL
  Filled 2020-08-19 (×2): qty 1

## 2020-08-19 MED ORDER — FENTANYL CITRATE (PF) 100 MCG/2ML IJ SOLN
INTRAMUSCULAR | Status: AC | PRN
  Administered 2020-08-19: 25 ug via INTRAVENOUS

## 2020-08-19 MED ORDER — ADULT MULTIVITAMIN W/MINERALS CH
1.0000 | ORAL_TABLET | Freq: Every day | ORAL | Status: DC
  Administered 2020-08-20 – 2020-08-23 (×4): 1 via ORAL
  Filled 2020-08-19 (×4): qty 1

## 2020-08-19 MED ORDER — FENTANYL CITRATE (PF) 100 MCG/2ML IJ SOLN
INTRAMUSCULAR | Status: AC
Start: 2020-08-19 — End: 2020-08-20
  Filled 2020-08-19: qty 2

## 2020-08-19 MED ORDER — MIDAZOLAM HCL 2 MG/2ML IJ SOLN
INTRAMUSCULAR | Status: AC
  Filled 2020-08-19: qty 2

## 2020-08-19 MED ORDER — ACETAMINOPHEN 500 MG PO TABS
500.0000 mg | ORAL_TABLET | Freq: Four times a day (QID) | ORAL | Status: DC | PRN
  Administered 2020-08-20 – 2020-08-23 (×3): 1000 mg via ORAL
  Filled 2020-08-19 (×3): qty 2

## 2020-08-19 MED ORDER — IOHEXOL 300 MG/ML  SOLN: 50.0000 mL | Freq: Once | INTRAMUSCULAR | Status: DC | PRN

## 2020-08-19 MED ORDER — GELATIN ABSORBABLE 12-7 MM EX MISC
CUTANEOUS | Status: AC
  Filled 2020-08-19: qty 1

## 2020-08-19 MED FILL — Diltiazem HCl 100 MG/100ML in Dextrose 5% IV Soln (1 MG/ML): INTRAVENOUS | Qty: 100 | Status: AC

## 2020-08-19 NOTE — Progress Notes (Signed)
Progress Note  Patient Name: Michele Green Date of Encounter: 08/19/2020  [facility] Cardiologist: [name], MD   Subjective   Having back pain this morning, otherwise no complaints.  Inpatient Medications    Scheduled Meds: . allopurinol  200 mg Oral QPM  . atorvastatin  20 mg Oral Daily  . feeding supplement (ENSURE ENLIVE)  237 mL Oral BID BM  . furosemide  20 mg Oral Daily  . mesalamine  1,500 mg Oral Daily  . metoprolol tartrate  100 mg Oral BID  . multivitamin with minerals  1 tablet Oral Daily  . pantoprazole  40 mg Oral Daily  . potassium chloride SA  20 mEq Oral Daily  . predniSONE  20 mg Oral Daily  . senna-docusate  2 tablet Oral BID  . tamoxifen  20 mg Oral Daily   Continuous Infusions: .  ceFAZolin (ANCEF) IV     PRN Meds: acetaminophen, ALPRAZolam, HYDROmorphone (DILAUDID) injection, ondansetron (ZOFRAN) IV, oxyCODONE   Vital Signs    Vitals:   08/19/20 0038 08/19/20 0442 08/19/20 0843 08/19/20 0846  BP: 99/74 108/81 108/79 108/79  Pulse: (!) 114 (!) 128 (!) 115   Resp: 18 20    Temp: 97.8 F (36.6 C) 98 F (36.7 C)    TempSrc: Oral Oral    SpO2: 100% 100%    Weight: 56.2 kg     Height:        Intake/Output Summary (Last 24 hours) at 08/19/2020 0920 Last data filed at 08/19/2020 0600 Gross per 24 hour  Intake 311.6 ml  Output 1050 ml  Net -738.4 ml   Last 3 Weights 08/19/2020 08/18/2020 08/17/2020  Weight (lbs) 123 lb 12.8 oz 123 lb 3.2 oz 123 lb 7.3 oz  Weight (kg) 56.155 kg 55.883 kg 56 kg      Telemetry    Atrial flutter with variable conduction, rates up to 150s yesterday, currently stable at 110s.- Personally Reviewed  ECG    No new EKG- Personally Reviewed  Physical Exam   GEN: No acute distress.   Neck: No JVD Cardiac: irregular, tachycardic, no murmurs Respiratory: Clear to auscultation bilaterally. GI: Soft, nontender MS: No edema Neuro:  Nonfocal  Psych: Normal affect   Labs    High Sensitivity  Troponin:   Recent Labs  Lab 08/16/20 1452 08/16/20 2352 08/17/20 0539 08/17/20 0614  TROPONINIHS 24* 27* 37* 33*      Chemistry Recent Labs  Lab 08/16/20 1452 08/16/20 1452 08/17/20 0614 08/18/20 0459 08/19/20 0454  NA 138   < > 141 138 139  K 4.2   < > 3.9 3.9 3.7  CL 103   < > 105 108 105  CO2 22   < > 24 23 25   GLUCOSE 111*   < > 83 84 81  BUN 20   < > 16 14 10   CREATININE 0.82   < > 0.81 0.86 0.73  CALCIUM 8.9   < > 8.4* 8.1* 8.2*  PROT 7.1  --   --   --   --   ALBUMIN 3.7  --   --   --   --   AST 34  --   --   --   --   ALT 37  --   --   --   --   ALKPHOS 44  --   --   --   --   BILITOT 0.7  --   --   --   --  GFRNONAA >60   < > >60 >60 >60  GFRAA >60   < > >60 >60 >60  ANIONGAP 13   < > 12 7 9    < > = values in this interval not displayed.     Hematology Recent Labs  Lab 08/17/20 0614 08/18/20 0459 08/19/20 0454  WBC 10.7* 6.8 6.2  RBC 3.87 3.65* 3.62*  HGB 12.8 12.3 12.1  HCT 39.9 38.4 38.3  MCV 103.1* 105.2* 105.8*  MCH 33.1 33.7 33.4  MCHC 32.1 32.0 31.6  RDW 15.9* 15.4 15.2  PLT 128* 103* 103*    BNP Recent Labs  Lab 08/16/20 1452  BNP 482.0*     DDimer No results for input(s): DDIMER in the last 168 hours.   Radiology    No results found.  Cardiac Studies   Echo 09/05/19: 1. Left ventricular ejection fraction, by visual estimation, is 60 to  65%. The left ventricle has normal function. There is moderately increased  left ventricular hypertrophy.  2. Global right ventricle has moderately reduced systolic function.The  right ventricular size is normal. No increase in right ventricular wall  thickness.  3. Left atrial size was severely dilated.  4. Right atrial size was normal.  5. The mitral valve has been repaired/replaced. Mild mitral valve  regurgitation. Mild-moderate mitral stenosis.  6. MV peak gradient, 19.4 mmHg. HR 117 during this study.  7. The tricuspid valve is grossly normal. Tricuspid valve regurgitation    is mild.  8. The aortic valve is tricuspid Aortic valve regurgitation was not  visualized by color flow Doppler.  9. The pulmonic valve was grossly normal. Pulmonic valve regurgitation is  trivial by color flow Doppler.  10. Moderately elevated pulmonary artery systolic pressure.  11. The inferior vena cava is dilated in size with <50% respiratory  variability, suggesting right atrial pressure of 15 mmHg.   Patient Profile     79 y.o. female with a hx of CAD s/p CABG with LIMA-LAD and severe MR s/p repair September 2019, breast cancer, HTN, afib prior to open heart surgery, Maze procedure without atrial appendage ligation September 2019, recurrent Afib/flutter post surgery despite amiodarone therapy and cardioversion November 2019 with plan to rate control who is being seen today for the evaluation of anticoagulation in the setting of spinal hematoma at the request of Dr. Louanne Belton.  Assessment & Plan    Persistent atypical Afib/flutter: She has failed multiple therapies including Maze procedure and cardioversion. Not a ablation candidate. LA severely dilated.  Currently in AFL with RVR - CHADSVASC of 5.  On Xarelto >>held for spinal hematoma.  OK to restart AC per neurosurgery, started heparin gtt as planning vertebroplasty with IR today - Metoprolol increased to 100 mg BID.  Rates improved.  Will switch to XL  T7 compression fracture with mild paraspinal hematoma: anticoagulation held initiallly, currently on heparin gtt - Restart AC once able post vertebroplasty  Elevated troponin with known CAD s/p CABG:HS troponin 37>33.  Denies chest pain - likely demand ischemia in the setting of afib RVR - continue statin and BB  Chronic diastolic CHF: BNP 469, imaging with mild pulmonary edema - Last echo showed preserved EF - Home lasix 20 mg restarted  Acute hypoxic respiratory failure: 2L O2 - home lasix restarted   For questions or updates, please contact [facility] Please  consult [email or web address] for contact info under        Signed, Donato Heinz, MD  08/19/2020, 9:20 AM

## 2020-08-19 NOTE — Procedures (Signed)
S/P T 7 VP S.Gwen Sarvis MD

## 2020-08-19 NOTE — Plan of Care (Signed)
  Problem: Education: Goal: Knowledge of disease or condition will improve Outcome: Progressing Goal: Understanding of medication regimen will improve Outcome: Progressing Goal: Individualized Educational Video(s) Outcome: Progressing

## 2020-08-19 NOTE — Progress Notes (Signed)
Ok to resume IV heparin for afib at 8pm per Dr. Estanislado Pandy. Resume previous rate of 650 units/hr and start daily HL (targe 0.3-0.7) and CBC in AM.   Delron Comer S. Alford Highland, PharmD, BCPS Clinical Staff Pharmacist [email or web address]

## 2020-08-19 NOTE — Progress Notes (Addendum)
PROGRESS NOTE  Michele Green:[id number] DOB: 1941/03/08 DOA: 08/16/2020 PCP: [name], MD   LOS: 2 days   Brief narrative: As per HPI,  Michele Green is a 79 y.o. female with past medical history significant for breast cancer, osteoporosis, persistent A. fib on Xarelto, essential hypertension, mitral valve repair, coronary artery disease status post CABG, chronic diastolic CHF presented too [facility] ED due to severe upper back pain after rolling over in bed the night prior to her presentation.  Denied any trauma.    She was found to have an acute or subacute T7 compression fracture on MRI.  Normal cord signal intensity, no cord lesions or syrinx.  Mild paraspinal hematoma at T7.    Patient also had atrial fibrillation with RVR, mild pulmonary edema on chest x-ray, and new hypoxia for which she was transferred to [facility] for further evaluation and management.  Troponin was mildly elevated, peaked at 27.  Patient was then admitted to hospital for further evaluation and treatment.  Assessment/Plan:  Principal Problem:   Spinal fracture of T7 vertebra (HCC) Active Problems:   Essential hypertension   Persistent atrial fibrillation (HCC)   Chronic diastolic HF (heart failure) (HCC)   S/P CABG x 1   S/P Maze operation for atrial fibrillation   Atrial fibrillation with RVR (HCC)   Acute/subacute T7 compression fracture/Mild paraspinal hematoma at T7. Off anticoagulation.  Patient is awaiting for IR guided kyphoplasty today.  No obvious recommendation from neurosurgical site.    Patient is on chronic steroids.  She takes Prolia vitamin D and calcium supplements as outpatient.  Spoke with neurosurgery on-call and since this is paraspinal hematoma, no contraindication for anticoagulation.  Acute hypoxic respiratory failure secondary to mild pulmonary edema and bibasilar atelectasis.  Oxygen was 88% on room air initially.  Continue p.o. Lasix.  Currently on 2 L of nasal  cannula oxygen.  Blood cultures negative in 2 days.  No leukocytosis.  Demand ischemia.  Mild elevated troponin.  A. fib with RVR.  EKG unremarkable.  Cardiology on board.  Atrial fibrillation with RVR.status post maze procedure for atrial fibrillation.  Initially required Cardizem drip.  On metoprolol.  Cardiology on board.  Still mild RVR.  Patient does have a back pain and anxiety which could be exacerbating as well. Dose of metoprolol has been modified to 50 twice daily.  Xarelto on hold due to planned kyphoplasty.  Was on heparin drip and will be on hold for potential kyphoplasty today.  Okay to restart Xarelto anticoagulation if okay after procedure.      Leukocytosis/SIRS.    Reactive.   Blood cultures negative in 2 days.  No leukocytosis.  Urine culture negative.  Chronic diastolic congestive heart failure.  History of coronary artery disease status post CABG, status post maze procedure for atrial fibrillation..  Last today echo on 09/05/2019 showed LV ejection fraction of 60 to 65% with left atrial dilatation.  Continue beta-blockers.  Intake and output charting.  Hyperlipidemia. On lipitor.  DVT prophylaxis: Place and maintain sequential compression device Start: 08/17/20 0302 Heparin drip  Code Status: Full code  Family Communication: None today.  Spoke with the patient's sister at bedside at length yesterday  Status is: Inpatient  Remains inpatient appropriate because:IV treatments appropriate due to intensity of illness or inability to take PO, Inpatient level of care appropriate due to severity of illness and A. fib.  RVR, paraspinal hematoma, kyphoplasty planned for today   Dispo:  Patient From: Home  Planned Disposition: [place] as per physical therapy recommendations on 927  Expected discharge date: 2 to 3 days  medically stable for discharge: No  Consultants:  Cardiology  Neurosurgery verbally consulted  Interventional  radiology  Procedures:  None yet  Antibiotics:  . Preprocedure antibiotic  Anti-infectives (From admission, onward)   Start     Dose/Rate Route Frequency Ordered Stop   08/19/20 0600  ceFAZolin (ANCEF) IVPB 2g/100 mL premix        2 g 200 mL/hr over 30 Minutes Intravenous On call 08/18/20 1414 08/20/20 0600     Subjective: Today, patient was seen and examined at bedside.  Complains of back pain.  Feels a little anxious about the procedure.  Denies chest pain palpitation, dizziness, lightheadedness but has mild shortness of breath   Objective: Vitals:   08/19/20 0038 08/19/20 0442  BP: 99/74 108/81  Pulse: (!) 114 (!) 128  Resp: 18 20  Temp: 97.8 F (36.6 C) 98 F (36.7 C)  SpO2: 100% 100%    Intake/Output Summary (Last 24 hours) at 08/19/2020 0735 Last data filed at 08/19/2020 0600 Gross per 24 hour  Intake 551.6 ml  Output 1050 ml  Net -498.4 ml   Filed Weights   08/17/20 0200 08/18/20 0028 08/19/20 0038  Weight: 56 kg 55.9 kg 56.2 kg   Body mass index is 21.25 kg/m.   Physical Exam: GENERAL: Patient is alert awake and oriented. Not in obvious distress.  On nasal cannula oxygen HENT: No scleral pallor or icterus. Pupils equally reactive to light. Oral mucosa is moist NECK: is supple, no gross swelling noted. CHEST:   Diminished breath sounds bilaterally.  CABG scar noted.  Tenderness over the thoracic spine in the mid region.  Coarse breath sounds noted CVS: S1 and S2 heard, no murmur.  Tachycardia with irregularly irregular rhythm. ABDOMEN: Soft, non-tender, bowel sounds are present. EXTREMITIES: No edema. CNS: Cranial nerves are intact. No focal motor deficits. SKIN: warm and dry without rashes.  Data Review: I have personally reviewed the following laboratory data and studies,  CBC: Recent Labs  Lab 08/16/20 1452 08/17/20 0614 08/18/20 0459 08/19/20 0454  WBC 16.4* 10.7* 6.8 6.2  NEUTROABS 13.6* 8.0* 4.8 4.1  HGB 13.7 12.8 12.3 12.1  HCT 43.6  39.9 38.4 38.3  MCV 105.1* 103.1* 105.2* 105.8*  PLT 157 128* 103* 619*   Basic Metabolic Panel: Recent Labs  Lab 08/16/20 1452 08/17/20 0614 08/18/20 0459 08/19/20 0454  NA 138 141 138 139  K 4.2 3.9 3.9 3.7  CL 103 105 108 105  CO2 22 24 23 25   GLUCOSE 111* 83 84 81  BUN 20 16 14 10   CREATININE 0.82 0.81 0.86 0.73  CALCIUM 8.9 8.4* 8.1* 8.2*  MG  --   --   --  1.8   Liver Function Tests: Recent Labs  Lab 08/16/20 1452  AST 34  ALT 37  ALKPHOS 44  BILITOT 0.7  PROT 7.1  ALBUMIN 3.7   No results for input(s): LIPASE, AMYLASE in the last 168 hours. No results for input(s): AMMONIA in the last 168 hours. Cardiac Enzymes: No results for input(s): CKTOTAL, CKMB, CKMBINDEX, TROPONINI in the last 168 hours. BNP (last 3 results) Recent Labs    05/17/20 1230 08/16/20 1452  BNP 237.4* 482.0*    ProBNP (last 3 results) No results for input(s): PROBNP in the last 8760 hours.  CBG: Recent Labs  Lab 08/17/20 0100 08/17/20 1710  GLUCAP 92 91  Recent Results (from the past 240 hour(s))  Respiratory Panel by RT PCR (Flu A&B, Covid) - Nasopharyngeal Swab     Status: None   Collection Time: 08/16/20  7:24 PM   Specimen: Nasopharyngeal Swab  Result Value Ref Range Status   SARS Coronavirus 2 by RT PCR NEGATIVE NEGATIVE Final    Comment: (NOTE) SARS-CoV-2 target nucleic acids are NOT DETECTED.  The SARS-CoV-2 RNA is generally detectable in upper respiratoy specimens during the acute phase of infection. The lowest concentration of SARS-CoV-2 viral copies this assay can detect is 131 copies/mL. A negative result does not preclude SARS-Cov-2 infection and should not be used as the sole basis for treatment or other patient management decisions. A negative result may occur with  improper specimen collection/handling, submission of specimen other than nasopharyngeal swab, presence of viral mutation(s) within the areas targeted by this assay, and inadequate number of  viral copies (<131 copies/mL). A negative result must be combined with clinical observations, patient history, and epidemiological information. The expected result is Negative.  Fact Sheet for Patients:  PinkCheek.be  Fact Sheet for Healthcare Providers:  GravelBags.it  This test is no t yet approved or cleared by the [place] FDA and  has been authorized for detection and/or diagnosis of SARS-CoV-2 by FDA under an Emergency Use Authorization (EUA). This EUA will remain  in effect (meaning this test can be used) for the duration of the COVID-19 declaration under Section 564(b)(1) of the Act, 21 U.S.C. section 360bbb-3(b)(1), unless the authorization is terminated or revoked sooner.     Influenza A by PCR NEGATIVE NEGATIVE Final   Influenza B by PCR NEGATIVE NEGATIVE Final    Comment: (NOTE) The Xpert Xpress SARS-CoV-2/FLU/RSV assay is intended as an aid in  the diagnosis of influenza from Nasopharyngeal swab specimens and  should not be used as a sole basis for treatment. Nasal washings and  aspirates are unacceptable for Xpert Xpress SARS-CoV-2/FLU/RSV  testing.  Fact Sheet for Patients: PinkCheek.be  Fact Sheet for Healthcare Providers: GravelBags.it  This test is not yet approved or cleared by the [place] FDA and  has been authorized for detection and/or diagnosis of SARS-CoV-2 by  FDA under an Emergency Use Authorization (EUA). This EUA will remain  in effect (meaning this test can be used) for the duration of the  Covid-19 declaration under Section 564(b)(1) of the Act, 21  U.S.C. section 360bbb-3(b)(1), unless the authorization is  terminated or revoked. Performed at [facility], [address] [place], [place] [address]   Culture, Urine     Status: Abnormal   Collection Time: 08/17/20  2:27 AM   Specimen: Urine, Random  Result  Value Ref Range Status   Specimen Description URINE, RANDOM  Final   Special Requests   Final    NONE Performed at [facility], [place] [address], [place], [place] [address]    Culture MULTIPLE SPECIES PRESENT, SUGGEST RECOLLECTION (A)  Final   Report Status 08/18/2020 FINAL  Final  MRSA PCR Screening     Status: None   Collection Time: 08/17/20  4:36 AM   Specimen: Nasal Mucosa; Nasopharyngeal  Result Value Ref Range Status   MRSA by PCR NEGATIVE NEGATIVE Final    Comment:        The GeneXpert MRSA Assay (FDA approved for NASAL specimens only), is one component of a comprehensive MRSA colonization surveillance program. It is not intended to diagnose MRSA infection nor to guide or monitor  treatment for MRSA infections. Performed at [facility], [place] [address] [place], [place] [address]   Culture, blood (routine x 2)     Status: None (Preliminary result)   Collection Time: 08/17/20  6:14 AM   Specimen: BLOOD RIGHT HAND  Result Value Ref Range Status   Specimen Description BLOOD RIGHT HAND  Final   Special Requests   Final    BOTTLES DRAWN AEROBIC ONLY Blood Culture results may not be optimal due to an inadequate volume of blood received in culture bottles   Culture   Final    NO GROWTH 2 DAYS Performed at [facility], [place] [address] [place], [place] [address]    Report Status PENDING  Incomplete  Culture, blood (routine x 2)     Status: None (Preliminary result)   Collection Time: 08/17/20  6:25 AM   Specimen: BLOOD RIGHT HAND  Result Value Ref Range Status   Specimen Description BLOOD RIGHT HAND  Final   Special Requests   Final    BOTTLES DRAWN AEROBIC ONLY Blood Culture results may not be optimal due to an inadequate volume of blood received in culture bottles   Culture   Final    NO GROWTH 2 DAYS Performed at [facility], [place] [address], [place], [place] [address]    Report Status PENDING  Incomplete     Studies: No results  found.    Flora Lipps, MD  [facility] 08/19/2020

## 2020-08-19 NOTE — Progress Notes (Signed)
Initial Nutrition Assessment  DOCUMENTATION CODES:   Not applicable  INTERVENTION:   -Ensure Enlive po BID, each supplement provides 350 kcal and 20 grams of protein -MVI with minerals daily  NUTRITION DIAGNOSIS:   Inadequate oral intake related to decreased appetite as evidenced by meal completion < 50%.  GOAL:   Patient will meet greater than or equal to 90% of their needs  MONITOR:   PO intake, Supplement acceptance, Labs, Weight trends, Skin, I & O's  REASON FOR ASSESSMENT:   Malnutrition Screening Tool    ASSESSMENT:   Michele Green is a 79 y.o. female with medical history significant for breast cancer, osteoporosis, persistent A. fib on Xarelto, essential hypertension, mitral valve repair, coronary artery disease status post CABG, chronic diastolic CHF who presented to [facility] ED due to severe upper back pain after rolling over in bed the night prior to her presentation  Pt admitted with a-fib with RVR and newly diagnosed acute/subacute T7 compression fracture/ mild paraspinal hematoma at 7.   Reviewed I/O's: -498 ml x 24 hours and -521 ml since admission  UOP: 1.1 L x 24 hours  Attempted to speak with pt via call to hospital room phone, however, unable to reach.   Per neurosurgery notes, no plan for neurosurgical interventions at this time.   Pt with decreased appetite; noted meal completion 20-60%.   Reviewed wt hx; wt has been stable over the past 9 months.   Medications reviewed and include potassium chloride, prednisone, and senna-docusate.   Per TOC notes, plan for SNF placement once medically stable.   Labs reviewed.   Diet Order:   Diet Order            Diet NPO time specified Except for: Sips with Meds  Diet effective midnight                 EDUCATION NEEDS:   No education needs have been identified at this time  Skin:  Skin Assessment: Reviewed RN Assessment  Last BM:  Unknown  Height:   Ht Readings from Last 1 Encounters:   08/17/20 5' 4"  (1.626 m)    Weight:   Wt Readings from Last 1 Encounters:  08/19/20 56.2 kg    Ideal Body Weight:  54.5 kg  BMI:  Body mass index is 21.25 kg/m.  Estimated Nutritional Needs:   Kcal:  1700-1900  Protein:  75-90 grams  Fluid:  > 1.7 L    Loistine Chance, RD, LDN, [place] Registered Dietitian II Certified Diabetes Care and Education Specialist Please refer to [facility] for RD and/or RD on-call/weekend/after hours pager

## 2020-08-19 NOTE — Progress Notes (Signed)
PT Cancellation Note  Patient Details Name: Michele Green MRN: [id number] DOB: 1941/11/17   Cancelled Treatment:    Reason Eval/Treat Not Completed: Medical issues which prohibited therapy (HR issues and going for vertebroplasty today. HOLD today. )   Godfrey Pick Tryson Lumley 08/19/2020, 9:25 AM Nalin Mazzocco W,PT Acute Rehabilitation Services Pager:  [phone]  Office:  [phone]

## 2020-08-19 NOTE — Progress Notes (Signed)
Pt came back from IR, alert and oriented no complaints any pain at this time.

## 2020-08-19 NOTE — Progress Notes (Signed)
CSW spoke with sister of pt, Seward Meth [phone], went over offers that were available. Ms. Mervyn Skeeters stated she woud research the offers with pt's son and have a discussion.

## 2020-08-20 ENCOUNTER — Encounter (HOSPITAL_COMMUNITY): Payer: Self-pay | Admitting: Internal Medicine

## 2020-08-20 DIAGNOSIS — I959 Hypotension, unspecified: Secondary | ICD-10-CM

## 2020-08-20 LAB — BASIC METABOLIC PANEL
Anion gap: 9 (ref 5–15)
BUN: 10 mg/dL (ref 8–23)
CO2: 25 mmol/L (ref 22–32)
Calcium: 8 mg/dL — ABNORMAL LOW (ref 8.9–10.3)
Chloride: 103 mmol/L (ref 98–111)
Creatinine, Ser: 0.8 mg/dL (ref 0.44–1.00)
GFR calc Af Amer: >60 mL/min (ref >60)
GFR calc non Af Amer: >60 mL/min (ref >60)
Glucose, Bld: 95 mg/dL (ref 70–99)
Potassium: 3.7 mmol/L (ref 3.5–5.1)
Sodium: 137 mmol/L (ref 135–145)

## 2020-08-20 LAB — CBC
HCT: 39.4 % (ref 36.0–46.0)
Hemoglobin: 12.2 g/dL (ref 12.0–15.0)
MCH: 32.5 pg (ref 26.0–34.0)
MCHC: 31 g/dL (ref 30.0–36.0)
MCV: 105.1 fL — ABNORMAL HIGH (ref 80.0–100.0)
Platelets: 120 10*3/uL — ABNORMAL LOW (ref 150–400)
RBC: 3.75 MIL/uL — ABNORMAL LOW (ref 3.87–5.11)
RDW: 15 % (ref 11.5–15.5)
WBC: 7.2 10*3/uL (ref 4.0–10.5)
nRBC: 0 % (ref 0.0–0.2)

## 2020-08-20 LAB — MAGNESIUM: Magnesium: 1.7 mg/dL (ref 1.7–2.4)

## 2020-08-20 LAB — HEPARIN LEVEL (UNFRACTIONATED): Heparin Unfractionated: 0.29 IU/mL — ABNORMAL LOW (ref 0.30–0.70)

## 2020-08-20 MED ORDER — SODIUM CHLORIDE 0.9 % IV BOLUS: 250.0000 mL | Freq: Once | INTRAVENOUS | Status: DC

## 2020-08-20 MED ORDER — SODIUM CHLORIDE 0.9 % IV BOLUS
250.0000 mL | Freq: Once | INTRAVENOUS | Status: AC
  Administered 2020-08-20: 250 mL via INTRAVENOUS

## 2020-08-20 MED ORDER — DIGOXIN 0.25 MG/ML IJ SOLN
0.2500 mg | INTRAMUSCULAR | Status: AC
  Administered 2020-08-20: 0.25 mg via INTRAVENOUS
  Filled 2020-08-20: qty 2

## 2020-08-20 MED ORDER — BUPIVACAINE HCL (PF) 0.25 % IJ SOLN
INTRAMUSCULAR | Status: AC | PRN
  Administered 2020-08-20: 20 mL

## 2020-08-20 MED ORDER — DIGOXIN 0.25 MG/ML IJ SOLN
0.1250 mg | Freq: Four times a day (QID) | INTRAMUSCULAR | Status: AC
  Administered 2020-08-20 – 2020-08-21 (×2): 0.125 mg via INTRAVENOUS
  Filled 2020-08-20 (×2): qty 2

## 2020-08-20 MED ORDER — METOPROLOL TARTRATE 100 MG PO TABS
100.0000 mg | ORAL_TABLET | Freq: Two times a day (BID) | ORAL | Status: DC
  Administered 2020-08-21 – 2020-08-23 (×5): 100 mg via ORAL
  Filled 2020-08-20 (×5): qty 1

## 2020-08-20 MED ORDER — DIGOXIN 125 MCG PO TABS
0.1250 mg | ORAL_TABLET | Freq: Every day | ORAL | Status: DC
  Administered 2020-08-21 – 2020-08-23 (×3): 0.125 mg via ORAL
  Filled 2020-08-20 (×3): qty 1

## 2020-08-20 MED ORDER — HEPARIN (PORCINE) 25000 UT/250ML-% IV SOLN
700.0000 [IU]/h | INTRAVENOUS | Status: DC
  Administered 2020-08-20 – 2020-08-21 (×2): 700 [IU]/h via INTRAVENOUS
  Filled 2020-08-20: qty 250

## 2020-08-20 NOTE — Progress Notes (Signed)
   08/20/20 2036  Assess: MEWS Score  Temp 98 F (36.7 C)  BP 109/79  Pulse Rate 98  Resp 18  Level of Consciousness Alert  SpO2 96 %  O2 Device Nasal Cannula  O2 Flow Rate (L/min) 2 L/min  Assess: MEWS Score  MEWS Temp 0  MEWS Systolic 0  MEWS Pulse 0  MEWS RR 0  MEWS LOC 0  MEWS Score 0  MEWS Score Color Green  Assess: if the MEWS score is Yellow or Red  Were vital signs taken at a resting state? Yes  Focused Assessment No change from prior assessment  Early Detection of Sepsis Score *See Row Information* Low  MEWS guidelines implemented *See Row Information* No, previously red, continue vital signs every 4 hours

## 2020-08-20 NOTE — Progress Notes (Signed)
ANTICOAGULATION CONSULT NOTE - Follow Up Consult  Pharmacy Consult for Heparin Indication: atrial fibrillation  Allergies  Allergen Reactions  . Shellfish Allergy Itching    Patient Measurements: Height: 5' 4"  (162.6 cm) Weight: 54.4 kg (119 lb 14.9 oz) IBW/kg (Calculated) : 54.7 Heparin Dosing Weight:  54.4 kg  Vital Signs: Temp: 97.9 F (36.6 C) (09/29 1142) Temp Source: Oral (09/29 1142) BP: 104/74 (09/29 1142) Pulse Rate: 82 (09/29 1142)  Labs: Recent Labs    08/18/20 0459 08/18/20 0459 08/18/20 1218 08/19/20 0454 08/20/20 1004  HGB 12.3   < >  --  12.1 12.2  HCT 38.4  --   --  38.3 39.4  PLT 103*  --   --  103* 120*  LABPROT  --   --  15.2  --   --   INR  --   --  1.2  --   --   HEPARINUNFRC  --   --   --   --  0.29*  CREATININE 0.86  --   --  0.73 0.80   < > = values in this interval not displayed.    Estimated Creatinine Clearance: 47.4 mL/min (by C-G formula based on SCr of 0.8 mg/dL).   Assessment:  Anticoag: SCDs, LMWH>IV heparin for afib  PTA Xarelto on hold d/t paraspinal hematoma. CHADS2VASc 5. In aflutter - 9/28: Vertebroplasty. Resume heparin post-IR - Hgb 12.32. Plts up to 120. Hep level 0.29  Goal of Therapy:  Heparin level 0.3-0.7 units/ml Monitor platelets by anticoagulation protocol: Yes   Plan:  Increase IV heparn to 700 units/hr F/U resuming Xarelto Daily HL and CBC   Adriel Desrosier S. Alford Highland, PharmD, BCPS Clinical Staff Pharmacist [email or web address] Alford Highland, Rajeev Escue Stillinger 08/20/2020,12:32 PM

## 2020-08-20 NOTE — Progress Notes (Signed)
PROGRESS NOTE    Michele Green   EKC:[id number]  DOB: 1941/06/28  DOA: 08/16/2020 PCP: [name], MD   Brief Narrative:  Michele Green is a 79 y.o.femalewith past medical history significant forbreast cancer, osteoporosis,persistent A. fib on Xarelto, essential hypertension,mitral valve repair,coronary artery disease status post CABG, chronic diastolic CHF presented too [facility] ED due tosevere upper back pain after rolling over in bed the night prior to her presentation. Denied any trauma.  She was found to have an acuteor subacuteT7 compression fracture on MRI. Normal cord signal intensity, no cord lesions or syrinx. Mild paraspinal hematoma at T7.   Patient also had atrial fibrillation with RVR,mildpulmonary edema on chest x-ray,and newhypoxiafor which she was transferred to [facility] further evaluation and management. Troponin was mildly elevated, peaked at 27.  Patient was then admitted to hospital for further evaluation and treatment.   Subjective: Having pain in between her shoulder blades and in her IV.    Assessment & Plan:   Principal Problem:   Spinal fracture of T7 vertebra - osteoporosis - has undergone a vertebroplasty on 9/28 -Follow-up on PT eval -Continue pain medications as needed  Active Problems:    Persistent atrial fibrillation with RVR -Cardiology attempting to [facility] is having some low blood pressures which are making it difficult to manage her rate -Digoxin being initiated today -250 cc normal saline bolus given and Lasix held by cardiology -Continue anticoagulation with IV heparin for now  Acute thrombocytopenia -Noted on 5/05-WPVXY uncertain-continue to follow    Chronic diastolic HF (heart failure)  -Compensated-Lasix being held by cardiology today due to low blood pressure-I will DC potassium as well    S/P CABG x 1   S/P Maze operation for atrial fibrillation  Ulcerative colitis Continue prednisone  and mesalamine  Breast cancer -Her oncologist Dr. Annamaria Boots has contacted me and states that it is okay to hold tamoxifen  for 1 to 2 months to prevent DVT   Time spent in minutes: 35 DVT prophylaxis: Heparin infusion Code Status: Full code Family Communication:  Disposition Plan:  Status is: Inpatient  Remains inpatient appropriate because:Treating A. fib with RVR, needs PT eval   Dispo:  Patient From: Home  Planned Disposition: [place]  Expected discharge date: 08/22/20  Medically stable for discharge: No       Consultants:   Cardiology  IR  Neurosurgery Procedures:   Kyphoplasty Antimicrobials:  Anti-infectives (From admission, onward)   Start     Dose/Rate Route Frequency Ordered Stop   08/19/20 1314  tobramycin (NEBCIN) 1.2 g powder       Note to Pharmacy: Falls, Heather   : cabinet override      08/19/20 1314 08/20/20 0129   08/19/20 0600  ceFAZolin (ANCEF) IVPB 2g/100 mL premix        2 g 200 mL/hr over 30 Minutes Intravenous On call 08/18/20 1414 08/19/20 1359       Objective: Vitals:   08/20/20 0856 08/20/20 1142 08/20/20 1342 08/20/20 1344  BP: (!) 74/64 104/74 (!) 84/62 (!) 84/62  Pulse: (!) 133 82 (!) 126 (!) 126  Resp: 15 18  18   Temp: 98.2 F (36.8 C) 97.9 F (36.6 C)  97.9 F (36.6 C)  TempSrc: Oral Oral    SpO2: 99% 98%    Weight:      Height:        Intake/Output Summary (Last 24 hours) at 08/20/2020 1723 Last data filed at 08/20/2020 0900 Gross per  24 hour  Intake 304.16 ml  Output 650 ml  Net -345.84 ml   Filed Weights   08/18/20 0028 08/19/20 0038 08/20/20 0449  Weight: 55.9 kg 56.2 kg 54.4 kg    Examination: General exam: Appears comfortable  HEENT: PERRLA, oral mucosa moist, no sclera icterus or thrush Respiratory system: Clear to auscultation. Respiratory effort normal. Cardiovascular system: S1 & S2 heard, irregularly irregular rate and rhythm with heart rate greater than 120 Gastrointestinal system:  Abdomen soft, non-tender, nondistended. Normal bowel sounds. Central nervous system: Alert and oriented. No focal neurological deficits. Extremities: No cyanosis, clubbing or edema Skin: No rashes or ulcers Psychiatry:  Mood & affect appropriate.     Data Reviewed: I have personally reviewed following labs and imaging studies  CBC: Recent Labs  Lab 08/16/20 1452 08/17/20 0614 08/18/20 0459 08/19/20 0454 08/20/20 1004  WBC 16.4* 10.7* 6.8 6.2 7.2  NEUTROABS 13.6* 8.0* 4.8 4.1  --   HGB 13.7 12.8 12.3 12.1 12.2  HCT 43.6 39.9 38.4 38.3 39.4  MCV 105.1* 103.1* 105.2* 105.8* 105.1*  PLT 157 128* 103* 103* 786*   Basic Metabolic Panel: Recent Labs  Lab 08/16/20 1452 08/17/20 0614 08/18/20 0459 08/19/20 0454 08/20/20 1004  NA 138 141 138 139 137  K 4.2 3.9 3.9 3.7 3.7  CL 103 105 108 105 103  CO2 22 24 23 25 25   GLUCOSE 111* 83 84 81 95  BUN 20 16 14 10 10   CREATININE 0.82 0.81 0.86 0.73 0.80  CALCIUM 8.9 8.4* 8.1* 8.2* 8.0*  MG  --   --   --  1.8 1.7   GFR: Estimated Creatinine Clearance: 47.4 mL/min (by C-G formula based on SCr of 0.8 mg/dL). Liver Function Tests: Recent Labs  Lab 08/16/20 1452  AST 34  ALT 37  ALKPHOS 44  BILITOT 0.7  PROT 7.1  ALBUMIN 3.7   No results for input(s): LIPASE, AMYLASE in the last 168 hours. No results for input(s): AMMONIA in the last 168 hours. Coagulation Profile: Recent Labs  Lab 08/18/20 1218  INR 1.2   Cardiac Enzymes: No results for input(s): CKTOTAL, CKMB, CKMBINDEX, TROPONINI in the last 168 hours. BNP (last 3 results) No results for input(s): PROBNP in the last 8760 hours. HbA1C: No results for input(s): HGBA1C in the last 72 hours. CBG: Recent Labs  Lab 08/17/20 0100 08/17/20 1710  GLUCAP 92 91   Lipid Profile: No results for input(s): CHOL, HDL, LDLCALC, TRIG, CHOLHDL, LDLDIRECT in the last 72 hours. Thyroid Function Tests: No results for input(s): TSH, T4TOTAL, FREET4, T3FREE, THYROIDAB in the last  72 hours. Anemia Panel: No results for input(s): VITAMINB12, FOLATE, FERRITIN, TIBC, IRON, RETICCTPCT in the last 72 hours. Urine analysis:    Component Value Date/Time   COLORURINE YELLOW 08/17/2020 [place] 08/17/2020 0434   LABSPEC 1.011 08/17/2020 0434   PHURINE 5.0 08/17/2020 0434   GLUCOSEU NEGATIVE 08/17/2020 0434   HGBUR NEGATIVE 08/17/2020 0434   BILIRUBINUR NEGATIVE 08/17/2020 0434   KETONESUR NEGATIVE 08/17/2020 0434   PROTEINUR NEGATIVE 08/17/2020 0434   NITRITE NEGATIVE 08/17/2020 0434   LEUKOCYTESUR NEGATIVE 08/17/2020 0434   Sepsis Labs: @LABRCNTIP (procalcitonin:4,lacticidven:4) ) Recent Results (from the past 240 hour(s))  Respiratory Panel by RT PCR (Flu A&B, Covid) - Nasopharyngeal Swab     Status: None   Collection Time: 08/16/20  7:24 PM   Specimen: Nasopharyngeal Swab  Result Value Ref Range Status   SARS Coronavirus 2 by RT PCR NEGATIVE NEGATIVE Final  Comment: (NOTE) SARS-CoV-2 target nucleic acids are NOT DETECTED.  The SARS-CoV-2 RNA is generally detectable in upper respiratoy specimens during the acute phase of infection. The lowest concentration of SARS-CoV-2 viral copies this assay can detect is 131 copies/mL. A negative result does not preclude SARS-Cov-2 infection and should not be used as the sole basis for treatment or other patient management decisions. A negative result may occur with  improper specimen collection/handling, submission of specimen other than nasopharyngeal swab, presence of viral mutation(s) within the areas targeted by this assay, and inadequate number of viral copies (<131 copies/mL). A negative result must be combined with clinical observations, patient history, and epidemiological information. The expected result is Negative.  Fact Sheet for Patients:  PinkCheek.be  Fact Sheet for Healthcare Providers:  GravelBags.it  This test is no t yet  approved or cleared by the [place] FDA and  has been authorized for detection and/or diagnosis of SARS-CoV-2 by FDA under an Emergency Use Authorization (EUA). This EUA will remain  in effect (meaning this test can be used) for the duration of the COVID-19 declaration under Section 564(b)(1) of the Act, 21 U.S.C. section 360bbb-3(b)(1), unless the authorization is terminated or revoked sooner.     Influenza A by PCR NEGATIVE NEGATIVE Final   Influenza B by PCR NEGATIVE NEGATIVE Final    Comment: (NOTE) The Xpert Xpress SARS-CoV-2/FLU/RSV assay is intended as an aid in  the diagnosis of influenza from Nasopharyngeal swab specimens and  should not be used as a sole basis for treatment. Nasal washings and  aspirates are unacceptable for Xpert Xpress SARS-CoV-2/FLU/RSV  testing.  Fact Sheet for Patients: PinkCheek.be  Fact Sheet for Healthcare Providers: GravelBags.it  This test is not yet approved or cleared by the [place] FDA and  has been authorized for detection and/or diagnosis of SARS-CoV-2 by  FDA under an Emergency Use Authorization (EUA). This EUA will remain  in effect (meaning this test can be used) for the duration of the  Covid-19 declaration under Section 564(b)(1) of the Act, 21  U.S.C. section 360bbb-3(b)(1), unless the authorization is  terminated or revoked. Performed at [facility], [address] [place], [place] [address]   Culture, Urine     Status: Abnormal   Collection Time: 08/17/20  2:27 AM   Specimen: Urine, Random  Result Value Ref Range Status   Specimen Description URINE, RANDOM  Final   Special Requests   Final    NONE Performed at [facility], [place] [address], [place], [place] [address]    Culture MULTIPLE SPECIES PRESENT, SUGGEST RECOLLECTION (A)  Final   Report Status 08/18/2020 FINAL  Final  MRSA PCR Screening     Status: None   Collection Time: 08/17/20   4:36 AM   Specimen: Nasal Mucosa; Nasopharyngeal  Result Value Ref Range Status   MRSA by PCR NEGATIVE NEGATIVE Final    Comment:        The GeneXpert MRSA Assay (FDA approved for NASAL specimens only), is one component of a comprehensive MRSA colonization surveillance program. It is not intended to diagnose MRSA infection nor to guide or monitor treatment for MRSA infections. Performed at [facility], [place] [address] [place], [place] [address]   Culture, blood (routine x 2)     Status: None (Preliminary result)   Collection Time: 08/17/20  6:14 AM   Specimen: BLOOD RIGHT HAND  Result Value Ref Range Status   Specimen Description BLOOD RIGHT  HAND  Final   Special Requests   Final    BOTTLES DRAWN AEROBIC ONLY Blood Culture results may not be optimal due to an inadequate volume of blood received in culture bottles   Culture   Final    NO GROWTH 3 DAYS Performed at [facility], [place] [address] [place], [place] [address]    Report Status PENDING  Incomplete  Culture, blood (routine x 2)     Status: None (Preliminary result)   Collection Time: 08/17/20  6:25 AM   Specimen: BLOOD RIGHT HAND  Result Value Ref Range Status   Specimen Description BLOOD RIGHT HAND  Final   Special Requests   Final    BOTTLES DRAWN AEROBIC ONLY Blood Culture results may not be optimal due to an inadequate volume of blood received in culture bottles   Culture   Final    NO GROWTH 3 DAYS Performed at [facility], [place] [address] [place], [place] [address]    Report Status PENDING  Incomplete         Radiology Studies: IR VERTEBROPLASTY CERV/THOR BX INC UNI/BIL INC/INJECT/IMAGING  Result Date: 08/20/2020 INDICATION: Severe midthoracic pain secondary to compression fracture at T7. EXAM: VERTEBROPLASTY AT T7 MEDICATIONS: As antibiotic prophylaxis, Ancef 2 g IV was ordered pre-procedure and administered intravenously within 1 hour of incision. ANESTHESIA/SEDATION: Moderate  (conscious) sedation was employed during this procedure. A total of Versed 1 mg and Fentanyl 25 mcg was administered intravenously. Moderate Sedation Time: 28 minutes. The patient's level of consciousness and vital signs were monitored continuously by radiology nursing throughout the procedure under my direct supervision. FLUOROSCOPY TIME:  Fluoroscopy Time: 9 minutes 12 seconds (357 mGy) COMPLICATIONS: None immediate. TECHNIQUE: Informed written consent was obtained from the patient after a thorough discussion of the procedural risks, benefits and alternatives. All questions were addressed. Maximal Sterile Barrier Technique was utilized including caps, mask, sterile gowns, sterile gloves, sterile drape, hand hygiene and skin antiseptic. A timeout was performed prior to the initiation of the procedure. PROCEDURE: The patient was placed prone on the fluoroscopic table. Nasal oxygen was administered. Physiologic monitoring was performed throughout the duration of the procedure. The skin overlying the thoracic region was prepped and draped in the usual sterile fashion. The T7 vertebral body was identified and the right pedicle was infiltrated with 0.25% Bupivacaine. This was then followed by the advancement of a 13-gauge Cook needle through the right pedicle into the anterior one-third at T7. A gentle contrast injection demonstrated a trabecular pattern of contrast with simultaneous opacification of the paraspinous veins. Gelfoam pledgets were then infused through the 13 gauge Cook spinal needle prior to the delivery of the methylmethacrylate mixture. At this time, methylmethacrylate mixture was reconstituted. Under biplane intermittent fluoroscopy, the methylmethacrylate was then injected into the T7 vertebral body with filling of the vertebral body. No extravasation was noted into the disk spaces or posteriorly into the spinal canal. No epidural venous contamination was seen. The needle was then removed. Hemostasis  was achieved at the skin entry site. There were no acute complications. Patient tolerated the procedure well. The patient was then returned to the floor in stable and good condition. IMPRESSION: 1. Status post vertebral body augmentation for painful compression fracture at T7 using vertebroplasty technique. Electronically Signed   By: Luanne Bras M.D.   On: 08/19/2020 16:21      Scheduled Meds: . allopurinol  200 mg Oral QPM  . atorvastatin  20 mg Oral Daily  .  digoxin  0.125 mg Intravenous Q6H  . [START ON 08/21/2020] digoxin  0.125 mg Oral Daily  . feeding supplement (ENSURE ENLIVE)  237 mL Oral BID BM  . mesalamine  1,500 mg Oral Daily  . metoprolol tartrate  100 mg Oral BID  . multivitamin with minerals  1 tablet Oral Daily  . pantoprazole  40 mg Oral Daily  . potassium chloride SA  20 mEq Oral Daily  . predniSONE  20 mg Oral Daily  . senna-docusate  2 tablet Oral BID  . tamoxifen  20 mg Oral Daily   Continuous Infusions: . heparin 700 Units/hr (08/20/20 1240)     LOS: 3 days      Debbe Odea, MD [facility] Pager: [email or web address] 08/20/2020, 5:23 PM

## 2020-08-20 NOTE — Progress Notes (Signed)
Progress Note  Patient Name: Michele Green Date of Encounter: 08/20/2020  [facility] Cardiologist: [name], MD   Subjective   SBP down to 70s this am.  Asymptomatic.  Reports back pain.    Inpatient Medications    Scheduled Meds:  allopurinol  200 mg Oral QPM   atorvastatin  20 mg Oral Daily   feeding supplement (ENSURE ENLIVE)  237 mL Oral BID BM   mesalamine  1,500 mg Oral Daily   metoprolol succinate  100 mg Oral BID   multivitamin with minerals  1 tablet Oral Daily   pantoprazole  40 mg Oral Daily   potassium chloride SA  20 mEq Oral Daily   predniSONE  20 mg Oral Daily   senna-docusate  2 tablet Oral BID   tamoxifen  20 mg Oral Daily   Continuous Infusions:  heparin     PRN Meds: acetaminophen, ALPRAZolam, HYDROmorphone (DILAUDID) injection, iohexol, ondansetron (ZOFRAN) IV, oxyCODONE   Vital Signs    Vitals:   08/19/20 1651 08/19/20 1957 08/20/20 0449 08/20/20 0856  BP: 100/67 109/70 (!) 84/66 (!) 74/64  Pulse: (!) 111 (!) 52 61 (!) 133  Resp: 16 20 18 15   Temp: 98 F (36.7 C) (!) 97.3 F (36.3 C) 98.3 F (36.8 C) 98.2 F (36.8 C)  TempSrc: Oral Oral Oral Oral  SpO2: 100% 98% 96% 99%  Weight:   54.4 kg   Height:        Intake/Output Summary (Last 24 hours) at 08/20/2020 1010 Last data filed at 08/20/2020 0900 Gross per 24 hour  Intake 484.16 ml  Output 2050 ml  Net -1565.84 ml   Last 3 Weights 08/20/2020 08/19/2020 08/18/2020  Weight (lbs) 119 lb 14.9 oz 123 lb 12.8 oz 123 lb 3.2 oz  Weight (kg) 54.4 kg 56.155 kg 55.883 kg      Telemetry    Atrial flutter with variable conduction, rates 110-150s.- Personally Reviewed  ECG    No new EKG- Personally Reviewed  Physical Exam   GEN: No acute distress.   Neck: No JVD Cardiac: irregular, tachycardic, no murmurs Respiratory: Clear to auscultation bilaterally. GI: Soft, nontender MS: No edema Neuro:  Nonfocal  Psych: Normal affect   Labs    High Sensitivity  Troponin:   Recent Labs  Lab 08/16/20 1452 08/16/20 2352 08/17/20 0539 08/17/20 0614  TROPONINIHS 24* 27* 37* 33*      Chemistry Recent Labs  Lab 08/16/20 1452 08/16/20 1452 08/17/20 0614 08/18/20 0459 08/19/20 0454  NA 138   < > 141 138 139  K 4.2   < > 3.9 3.9 3.7  CL 103   < > 105 108 105  CO2 22   < > 24 23 25   GLUCOSE 111*   < > 83 84 81  BUN 20   < > 16 14 10   CREATININE 0.82   < > 0.81 0.86 0.73  CALCIUM 8.9   < > 8.4* 8.1* 8.2*  PROT 7.1  --   --   --   --   ALBUMIN 3.7  --   --   --   --   AST 34  --   --   --   --   ALT 37  --   --   --   --   ALKPHOS 44  --   --   --   --   BILITOT 0.7  --   --   --   --  GFRNONAA >60   < > >60 >60 >60  GFRAA >60   < > >60 >60 >60  ANIONGAP 13   < > 12 7 9    < > = values in this interval not displayed.     Hematology Recent Labs  Lab 08/17/20 0614 08/18/20 0459 08/19/20 0454  WBC 10.7* 6.8 6.2  RBC 3.87 3.65* 3.62*  HGB 12.8 12.3 12.1  HCT 39.9 38.4 38.3  MCV 103.1* 105.2* 105.8*  MCH 33.1 33.7 33.4  MCHC 32.1 32.0 31.6  RDW 15.9* 15.4 15.2  PLT 128* 103* 103*    BNP Recent Labs  Lab 08/16/20 1452  BNP 482.0*     DDimer No results for input(s): DDIMER in the last 168 hours.   Radiology    No results found.  Cardiac Studies   Echo 09/05/19: 1. Left ventricular ejection fraction, by visual estimation, is 60 to  65%. The left ventricle has normal function. There is moderately increased  left ventricular hypertrophy.  2. Global right ventricle has moderately reduced systolic function.The  right ventricular size is normal. No increase in right ventricular wall  thickness.  3. Left atrial size was severely dilated.  4. Right atrial size was normal.  5. The mitral valve has been repaired/replaced. Mild mitral valve  regurgitation. Mild-moderate mitral stenosis.  6. MV peak gradient, 19.4 mmHg. HR 117 during this study.  7. The tricuspid valve is grossly normal. Tricuspid valve regurgitation    is mild.  8. The aortic valve is tricuspid Aortic valve regurgitation was not  visualized by color flow Doppler.  9. The pulmonic valve was grossly normal. Pulmonic valve regurgitation is  trivial by color flow Doppler.  10. Moderately elevated pulmonary artery systolic pressure.  11. The inferior vena cava is dilated in size with <50% respiratory  variability, suggesting right atrial pressure of 15 mmHg.   Patient Profile     79 y.o. female with a hx of CAD s/p CABG with LIMA-LAD and severe MR s/p repair September 2019, breast cancer, HTN, afib prior to open heart surgery, Maze procedure without atrial appendage ligation September 2019, recurrent Afib/flutter post surgery despite amiodarone therapy and cardioversion November 2019 with plan to rate control who is being seen today for the evaluation of anticoagulation in the setting of spinal hematoma at the request of Dr. Louanne Belton.  Assessment & Plan    Hypotension: SBP down to 70s this AM.  She is asymptomatic.  Suspect may be hypovolemic as net negative 1.2L yesterday on PO lasix 20, likely due to being NPO for procedure.  Will give 250 cc bolus.  Will hold AM metoprolol and start digoxin for rate control.    Persistent atypical Afib/flutter: She has failed multiple therapies including Maze procedure, amio and cardioversion. Not a ablation candidate. LA severely dilated.  Currently in AFL with RVR - CHADSVASC of 5.  On Xarelto >>held for spinal hematoma.  OK to restart AC per neurosurgery, started heparin gtt  - Metoprolol increased to 100 mg BID, got XL dose last night.  Hypotensive this morning, AM dose held.  Will start digoxin given hypotension, discuss with pharmacy   T7 compression fracture with mild paraspinal hematoma: anticoagulation held initiallly, currently on heparin gtt.  S/p vertebroplasty yesterday - Heparin gtt restarted  Elevated troponin with known CAD s/p CABG:HS troponin 37>33.  Denies chest pain - likely demand  ischemia in the setting of afib RVR - continue statin and BB  Chronic diastolic CHF: BNP 161, imaging with  mild pulmonary edema.  Home lasix was restarted, now appearing mildly hypovolemic and hypotensive - Home lasix 20 mg restarted, will hold as above.  Given gentle IV fluid bolus given hypotension as above  Acute hypoxic respiratory failure: SpO2 99% on 2L O2   For questions or updates, please contact [place] Please consult [email or web address] for contact info under        Signed, Donato Heinz, MD  08/20/2020, 10:10 AM

## 2020-08-20 NOTE — Progress Notes (Signed)
Nutrition Follow-up  DOCUMENTATION CODES:   Not applicable  INTERVENTION:   -Continue Ensure Enlive po BID, each supplement provides 350 kcal and 20 grams of protein -Continue MVI with minerals daily -Magic cup TID with meals, each supplement provides 290 kcal and 9 grams of protein -Feeding assistance with meals -Manager's check for all meal trays  NUTRITION DIAGNOSIS:   Inadequate oral intake related to decreased appetite as evidenced by meal completion < 50%.  Ongoing  GOAL:   Patient will meet greater than or equal to 90% of their needs  Progressing   MONITOR:   PO intake, Supplement acceptance, Labs, Weight trends, Skin, I & O's  REASON FOR ASSESSMENT:   Malnutrition Screening Tool    ASSESSMENT:   Michele Green is a 79 y.o. female with medical history significant for breast cancer, osteoporosis, persistent A. fib on Xarelto, essential hypertension, mitral valve repair, coronary artery disease status post CABG, chronic diastolic CHF who presented to [facility] ED due to severe upper back pain after rolling over in bed the night prior to her presentation  9/28- s/p T 7 VP  Reviewed I/O's: +1.2 L x 24 hours and +1.7 L since admission  UOP: 1.7 L x 24 hours  Spoke with pt at bedside, who had multiple complaints at time of visit. She reports feeling poorly secondary to back pain, which has not resolved from procedure yesterday.   Per pt, she typically has a good appetite and consumes 2 meals per day PTA. Intake has been variable here; noted meal completion 25%. Pt reports she only received a muffin for breakfast and has a hard time opening containers on her meal trays. Per MAR, she is consuming Ensure Enlive supplements.   She denies any weight loss. Reviewed wt hx; wt has been stable over the past 9 months.   Pt shares with this RD that she was very active and independent PTA and she feels very anxious about being in the hospital and having to go to SNF at discharge.  RD actively listened to pt concerns. RD provided pt with hair comb per her request and seemed to be in better spirits when this RD provided it to her.   Medications reviewed and include prednisone and senokot.   Labs reviewed.   NUTRITION - FOCUSED PHYSICAL EXAM:    Most Recent Value  Orbital Region No depletion  Upper Arm Region No depletion  Thoracic and Lumbar Region No depletion  Buccal Region No depletion  Temple Region No depletion  Clavicle Bone Region No depletion  Clavicle and Acromion Bone Region No depletion  Scapular Bone Region No depletion  Dorsal Hand No depletion  Patellar Region No depletion  Anterior Thigh Region No depletion  Posterior Calf Region No depletion  Edema (RD Assessment) None  Hair Reviewed  Eyes Reviewed  Mouth Reviewed  Skin Reviewed  Nails Reviewed       Diet Order:   Diet Order            Diet Heart Room service appropriate? Yes; Fluid consistency: Thin  Diet effective now                 EDUCATION NEEDS:   No education needs have been identified at this time  Skin:  Skin Assessment: Reviewed RN Assessment  Last BM:  Unknown  Height:   Ht Readings from Last 1 Encounters:  08/17/20 5' 4"  (1.626 m)    Weight:   Wt Readings from Last 1 Encounters:  08/20/20  54.4 kg    Ideal Body Weight:  54.5 kg  BMI:  Body mass index is 20.59 kg/m.  Estimated Nutritional Needs:   Kcal:  1700-1900  Protein:  75-90 grams  Fluid:  > 1.7 L    Loistine Chance, RD, LDN, [place] Registered Dietitian II Certified Diabetes Care and Education Specialist Please refer to [facility] for RD and/or RD on-call/weekend/after hours pager

## 2020-08-20 NOTE — Progress Notes (Signed)
PT Cancellation Note  Patient Details Name: TRANICE LADUKE MRN: [id number] DOB: October 05, 1941   Cancelled Treatment:     PT check with RN Nira Conn stating pt can work with therapy; Pt states RN Shirlee Limerick from 9/28 said do not get up and move around and do not do exercises, Pt refused PT  Lyanne Co, DPT Acute Rehabilitation Services [phone]   [place] 08/20/2020, 11:08 AM

## 2020-08-20 NOTE — Care Management Note (Signed)
At 1200 pt complained of pain from IV site rt ac. Taped tubing down, offered to have IV site changed so that it would not hurt. Pt refused. At 1345 pt said that she was pain free

## 2020-08-21 LAB — CBC
HCT: 33.7 % — ABNORMAL LOW (ref 36.0–46.0)
Hemoglobin: 10.7 g/dL — ABNORMAL LOW (ref 12.0–15.0)
MCH: 32.8 pg (ref 26.0–34.0)
MCHC: 31.8 g/dL (ref 30.0–36.0)
MCV: 103.4 fL — ABNORMAL HIGH (ref 80.0–100.0)
Platelets: 97 10*3/uL — ABNORMAL LOW (ref 150–400)
RBC: 3.26 MIL/uL — ABNORMAL LOW (ref 3.87–5.11)
RDW: 14.5 % (ref 11.5–15.5)
WBC: 3.7 10*3/uL — ABNORMAL LOW (ref 4.0–10.5)
nRBC: 0 % (ref 0.0–0.2)

## 2020-08-21 LAB — HEPARIN LEVEL (UNFRACTIONATED): Heparin Unfractionated: 0.32 IU/mL (ref 0.30–0.70)

## 2020-08-21 NOTE — Progress Notes (Signed)
ANTICOAGULATION CONSULT NOTE - Follow Up Consult  Pharmacy Consult for Heparin Indication: atrial fibrillation  Allergies  Allergen Reactions  . Shellfish Allergy Itching    Patient Measurements: Height: 5' 4"  (162.6 cm) Weight: 55.4 kg (122 lb 2.2 oz) IBW/kg (Calculated) : 54.7 Heparin Dosing Weight:  54.4 kg  Vital Signs: Temp: 98 F (36.7 C) (09/30 0815) Temp Source: Oral (09/30 0454) BP: 100/60 (09/30 0815) Pulse Rate: 121 (09/30 0815)  Labs: Recent Labs    08/18/20 1218 08/19/20 0454 08/19/20 0454 08/20/20 1004 08/21/20 0839  HGB  --  12.1   < > 12.2 10.7*  HCT  --  38.3  --  39.4 33.7*  PLT  --  103*  --  120* 97*  LABPROT 15.2  --   --   --   --   INR 1.2  --   --   --   --   HEPARINUNFRC  --   --   --  0.29* 0.32  CREATININE  --  0.73  --  0.80  --    < > = values in this interval not displayed.    Estimated Creatinine Clearance: 47.6 mL/min (by C-G formula based on SCr of 0.8 mg/dL).   Assessment:  Anticoag: SCDs, LMWH>IV heparin for afib  PTA Xarelto on hold d/t paraspinal hematoma. CHADS2VASc 5. In aflutter - 9/28: Vertebroplasty. Resume heparin post-IR - Hgb 10.7 down. Plts only 97. Hep level 0.32 in goal  Goal of Therapy:  Heparin level 0.3-0.7 units/ml Monitor platelets by anticoagulation protocol: Yes   Plan:  Con't IV heparn to 700 units/hr Daily HL and CBC F/U resuming Xarelto   Chenita Ruda S. Alford Highland, PharmD, BCPS Clinical Staff Pharmacist [email or web address] Alford Highland, [employer] 08/21/2020,10:56 AM

## 2020-08-21 NOTE — Progress Notes (Signed)
PT Cancellation Note  Patient Details Name: Michele Green MRN: [id number] DOB: [birth year]   Cancelled Treatment:     Pt HR 124, RN deferred till PM, messaged MD regarding elevated HR to determine if MD still wants pt OOB with therapy, will attempt in PM time permitting.  Lyanne Co, DPT Acute Rehabilitation Services [phone]   Michele Green 08/21/2020, 9:53 AM

## 2020-08-21 NOTE — Progress Notes (Signed)
   08/21/20 2255  Assess: MEWS Score  Pulse Rate (!) 122  Assess: MEWS Score  MEWS Temp 0  MEWS Systolic 0  MEWS Pulse 2  MEWS RR 0  MEWS LOC 0  MEWS Score 2  MEWS Score Color Yellow  Assess: if the MEWS score is Yellow or Red  Were vital signs taken at a resting state? Yes  Focused Assessment No change from prior assessment  Early Detection of Sepsis Score *See Row Information* Low  MEWS guidelines implemented *See Row Information* No, previously yellow, continue vital signs every 4 hours  Treat  MEWS Interventions Administered scheduled meds/treatments;Administered prn meds/treatments  Document  Patient Outcome Stabilized after interventions  Progress note created (see row info) Yes

## 2020-08-21 NOTE — Progress Notes (Signed)
   08/21/20 2143  Assess: MEWS Score  Temp 98.9 F (37.2 C)  BP 102/68  Pulse Rate (!) 124  Resp 18  SpO2 97 %  O2 Device Nasal Cannula  O2 Flow Rate (L/min) 2 L/min  Assess: MEWS Score  MEWS Temp 0  MEWS Systolic 0  MEWS Pulse 2  MEWS RR 0  MEWS LOC 0  MEWS Score 2  MEWS Score Color Yellow  Assess: if the MEWS score is Yellow or Red  Were vital signs taken at a resting state? Yes  Focused Assessment No change from prior assessment  Early Detection of Sepsis Score *See Row Information* Low  MEWS guidelines implemented *See Row Information* No, previously yellow, continue vital signs every 4 hours  Treat  MEWS Interventions Administered scheduled meds/treatments;Administered prn meds/treatments  Document  Patient Outcome Stabilized after interventions  Progress note created (see row info) Yes

## 2020-08-21 NOTE — Progress Notes (Signed)
PT worked with patient, Sat in the reclining chair for proximately 45 minutes. HR 130s Aflutter. Patient returned to bed. MD made aware. Will continue to monitor.

## 2020-08-21 NOTE — Progress Notes (Signed)
PROGRESS NOTE    [name]   BWL:[id number]  DOB: 1940/12/31  DOA: 08/16/2020 PCP: [name], MD   Brief Narrative:  Michele Green is a 79 y.o.femalewith past medical history significant forbreast cancer, osteoporosis,persistent A. Fib/ flutter s/p Maze on Xarelto and rate control, essential hypertension,mitral valve repair,coronary artery disease status post CABG, chronic diastolic CHF, ulcerative colitis presented too [facility] ED due tosevere upper back pain after rolling over in bed the night prior to her presentation. Denied any trauma.  She was found to have an acuteor subacuteT7 compression fracture on MRI. Normal cord signal intensity, no cord lesions or syrinx. Mild paraspinal hematoma at T7.   Patient also had atrial fibrillation with RVR,mildpulmonary edema on chest x-ray,and newhypoxiafor which she was transferred to [facility] further evaluation and management. Troponin was mildly elevated, peaked at 27.  Patient was then admitted to hospital for further evaluation and treatment.   Subjective: Pain in mid back.     Assessment & Plan:   Principal Problem:   Non traumatic spinal fracture of T7 vertebra - osteoporosis - has undergone a vertebroplasty on 9/28 -  PT recommending SNF -Continue pain medications as needed - of note, she is on Prednisone for UC  Active Problems:    Persistent atrial fibrillation with RVR  S/P Maze operation   -Cardiology attempting to [facility] is having some low blood pressures which are making it difficult to manage her rate- Dig loading per cardiology - on  IV heparin - I will switch to Xarelto now  Chronic thrombocytopenia    Chronic diastolic HF (heart failure)  -Compensated- lasix on hold as she is hypotensive    S/P CABG x 1  Ulcerative colitis Continue prednisone and mesalamine- patient states Dr Therisa Doyne increased her Prednisone to 20 mg and plans to f/u in Nov to see if it should be  reduced  Breast cancer -Her oncologist Dr. Burr Medico has contacted me and states that we should hold tamoxifen  for 1 to 2 months to prevent DVT   Time spent in minutes: 35 DVT prophylaxis: Heparin infusion Code Status: Full code Family Communication:  Disposition Plan:  Status is: Inpatient  Remains inpatient appropriate because:Treating A. fib with RVR, needs PT eval   Dispo:  Patient From: Home  Planned Disposition: [place]  Expected discharge date: 08/23/20  Medically stable for discharge: No       Consultants:   Cardiology  IR  Neurosurgery Procedures:   Kyphoplasty Antimicrobials:  Anti-infectives (From admission, onward)   Start     Dose/Rate Route Frequency Ordered Stop   08/19/20 1314  tobramycin (NEBCIN) 1.2 g powder       Note to Pharmacy: [place], Heather   : cabinet override      08/19/20 1314 08/20/20 0129   08/19/20 0600  ceFAZolin (ANCEF) IVPB 2g/100 mL premix        2 g 200 mL/hr over 30 Minutes Intravenous On call 08/18/20 1414 08/19/20 1359       Objective: Vitals:   08/21/20 0454 08/21/20 0815 08/21/20 1159 08/21/20 1430  BP: 113/80 100/60 97/76   Pulse: (!) 120 (!) 121 (!) 128 (!) 131  Resp: 18 15 14    Temp: 97.6 F (36.4 C) 98 F (36.7 C) 97.9 F (36.6 C)   TempSrc: Oral  Oral   SpO2: 97% 100% 100%   Weight: 55.4 kg     Height:        Intake/Output Summary (Last 24  hours) at 08/21/2020 1450 Last data filed at 08/21/2020 0600 Gross per 24 hour  Intake 361.33 ml  Output 300 ml  Net 61.33 ml   Filed Weights   08/19/20 0038 08/20/20 0449 08/21/20 0454  Weight: 56.2 kg 54.4 kg 55.4 kg    Examination: General exam: Appears comfortable  HEENT: PERRLA, oral mucosa moist, no sclera icterus or thrush Respiratory system: Clear to auscultation. Respiratory effort normal. Cardiovascular system: S1 & S2 heard, irregularly irregular rate and rhythm with heart rate greater than 120 Gastrointestinal system: Abdomen soft,  non-tender, nondistended. Normal bowel sounds. Central nervous system: Alert and oriented. No focal neurological deficits. Extremities: No cyanosis, clubbing or edema Skin: No rashes or ulcers Psychiatry:  Mood & affect appropriate.     Data Reviewed: I have personally reviewed following labs and imaging studies  CBC: Recent Labs  Lab 08/16/20 1452 08/16/20 1452 08/17/20 0614 08/18/20 0459 08/19/20 0454 08/20/20 1004 08/21/20 0839  WBC 16.4*   < > 10.7* 6.8 6.2 7.2 3.7*  NEUTROABS 13.6*  --  8.0* 4.8 4.1  --   --   HGB 13.7   < > 12.8 12.3 12.1 12.2 10.7*  HCT 43.6   < > 39.9 38.4 38.3 39.4 33.7*  MCV 105.1*   < > 103.1* 105.2* 105.8* 105.1* 103.4*  PLT 157   < > 128* 103* 103* 120* 97*   < > = values in this interval not displayed.   Basic Metabolic Panel: Recent Labs  Lab 08/16/20 1452 08/17/20 0614 08/18/20 0459 08/19/20 0454 08/20/20 1004  NA 138 141 138 139 137  K 4.2 3.9 3.9 3.7 3.7  CL 103 105 108 105 103  CO2 22 24 23 25 25   GLUCOSE 111* 83 84 81 95  BUN 20 16 14 10 10   CREATININE 0.82 0.81 0.86 0.73 0.80  CALCIUM 8.9 8.4* 8.1* 8.2* 8.0*  MG  --   --   --  1.8 1.7   GFR: Estimated Creatinine Clearance: 47.6 mL/min (by C-G formula based on SCr of 0.8 mg/dL). Liver Function Tests: Recent Labs  Lab 08/16/20 1452  AST 34  ALT 37  ALKPHOS 44  BILITOT 0.7  PROT 7.1  ALBUMIN 3.7   No results for input(s): LIPASE, AMYLASE in the last 168 hours. No results for input(s): AMMONIA in the last 168 hours. Coagulation Profile: Recent Labs  Lab 08/18/20 1218  INR 1.2   Cardiac Enzymes: No results for input(s): CKTOTAL, CKMB, CKMBINDEX, TROPONINI in the last 168 hours. BNP (last 3 results) No results for input(s): PROBNP in the last 8760 hours. HbA1C: No results for input(s): HGBA1C in the last 72 hours. CBG: Recent Labs  Lab 08/17/20 0100 08/17/20 1710  GLUCAP 92 91   Lipid Profile: No results for input(s): CHOL, HDL, LDLCALC, TRIG, CHOLHDL,  LDLDIRECT in the last 72 hours. Thyroid Function Tests: No results for input(s): TSH, T4TOTAL, FREET4, T3FREE, THYROIDAB in the last 72 hours. Anemia Panel: No results for input(s): VITAMINB12, FOLATE, FERRITIN, TIBC, IRON, RETICCTPCT in the last 72 hours. Urine analysis:    Component Value Date/Time   COLORURINE YELLOW 08/17/2020 [place] 08/17/2020 0434   LABSPEC 1.011 08/17/2020 [place] 5.0 08/17/2020 [place] 08/17/2020 0434   HGBUR NEGATIVE 08/17/2020 [place] 08/17/2020 [place] 08/17/2020 0434   PROTEINUR NEGATIVE 08/17/2020 0434   NITRITE NEGATIVE 08/17/2020 0434   LEUKOCYTESUR NEGATIVE 08/17/2020 0434   Sepsis  Labs: @LABRCNTIP (procalcitonin:4,lacticidven:4) ) Recent Results (from the past 240 hour(s))  Respiratory Panel by RT PCR (Flu A&B, Covid) - Nasopharyngeal Swab     Status: None   Collection Time: 08/16/20  7:24 PM   Specimen: Nasopharyngeal Swab  Result Value Ref Range Status   SARS Coronavirus 2 by RT PCR NEGATIVE NEGATIVE Final    Comment: (NOTE) SARS-CoV-2 target nucleic acids are NOT DETECTED.  The SARS-CoV-2 RNA is generally detectable in upper respiratoy specimens during the acute phase of infection. The lowest concentration of SARS-CoV-2 viral copies this assay can detect is 131 copies/mL. A negative result does not preclude SARS-Cov-2 infection and should not be used as the sole basis for treatment or other patient management decisions. A negative result may occur with  improper specimen collection/handling, submission of specimen other than nasopharyngeal swab, presence of viral mutation(s) within the areas targeted by this assay, and inadequate number of viral copies (<131 copies/mL). A negative result must be combined with clinical observations, patient history, and epidemiological information. The expected result is Negative.  Fact Sheet for Patients:    PinkCheek.be  Fact Sheet for Healthcare Providers:  GravelBags.it  This test is no t yet approved or cleared by the [place] FDA and  has been authorized for detection and/or diagnosis of SARS-CoV-2 by FDA under an Emergency Use Authorization (EUA). This EUA will remain  in effect (meaning this test can be used) for the duration of the COVID-19 declaration under Section 564(b)(1) of the Act, 21 U.S.C. section 360bbb-3(b)(1), unless the authorization is terminated or revoked sooner.     Influenza A by PCR NEGATIVE NEGATIVE Final   Influenza B by PCR NEGATIVE NEGATIVE Final    Comment: (NOTE) The Xpert Xpress SARS-CoV-2/FLU/RSV assay is intended as an aid in  the diagnosis of influenza from Nasopharyngeal swab specimens and  should not be used as a sole basis for treatment. Nasal washings and  aspirates are unacceptable for Xpert Xpress SARS-CoV-2/FLU/RSV  testing.  Fact Sheet for Patients: PinkCheek.be  Fact Sheet for Healthcare Providers: GravelBags.it  This test is not yet approved or cleared by the [place] FDA and  has been authorized for detection and/or diagnosis of SARS-CoV-2 by  FDA under an Emergency Use Authorization (EUA). This EUA will remain  in effect (meaning this test can be used) for the duration of the  Covid-19 declaration under Section 564(b)(1) of the Act, 21  U.S.C. section 360bbb-3(b)(1), unless the authorization is  terminated or revoked. Performed at [facility], [address] [place], [place] [address]   Culture, Urine     Status: Abnormal   Collection Time: 08/17/20  2:27 AM   Specimen: Urine, Random  Result Value Ref Range Status   Specimen Description URINE, RANDOM  Final   Special Requests   Final    NONE Performed at [facility], [place] [address], [place],  [address]    Culture MULTIPLE  SPECIES PRESENT, SUGGEST RECOLLECTION (A)  Final   Report Status 08/18/2020 FINAL  Final  MRSA PCR Screening     Status: None   Collection Time: 08/17/20  4:36 AM   Specimen: Nasal Mucosa; Nasopharyngeal  Result Value Ref Range Status   MRSA by PCR NEGATIVE NEGATIVE Final    Comment:        The GeneXpert MRSA Assay (FDA approved for NASAL specimens only), is one component of a comprehensive MRSA colonization surveillance program. It is not intended to diagnose MRSA infection nor  to guide or monitor treatment for MRSA infections. Performed at [facility], [place] [address] [place], [place] [address]   Culture, blood (routine x 2)     Status: None (Preliminary result)   Collection Time: 08/17/20  6:14 AM   Specimen: BLOOD RIGHT HAND  Result Value Ref Range Status   Specimen Description BLOOD RIGHT HAND  Final   Special Requests   Final    BOTTLES DRAWN AEROBIC ONLY Blood Culture results may not be optimal due to an inadequate volume of blood received in culture bottles   Culture   Final    NO GROWTH 4 DAYS Performed at [facility], [place] [address] [place], [place] [address]    Report Status PENDING  Incomplete  Culture, blood (routine x 2)     Status: None (Preliminary result)   Collection Time: 08/17/20  6:25 AM   Specimen: BLOOD RIGHT HAND  Result Value Ref Range Status   Specimen Description BLOOD RIGHT HAND  Final   Special Requests   Final    BOTTLES DRAWN AEROBIC ONLY Blood Culture results may not be optimal due to an inadequate volume of blood received in culture bottles   Culture   Final    NO GROWTH 4 DAYS Performed at [facility], [place] [address], [place], [place] [address]    Report Status PENDING  Incomplete         Radiology Studies: No results found.    Scheduled Meds: . allopurinol  200 mg Oral QPM  . atorvastatin  20 mg Oral Daily  . digoxin  0.125 mg Oral Daily  . feeding supplement (ENSURE ENLIVE)  237 mL Oral BID BM  .  mesalamine  1,500 mg Oral Daily  . metoprolol tartrate  100 mg Oral BID  . multivitamin with minerals  1 tablet Oral Daily  . pantoprazole  40 mg Oral Daily  . predniSONE  20 mg Oral Daily  . senna-docusate  2 tablet Oral BID   Continuous Infusions: . heparin 700 Units/hr (08/21/20 1102)     LOS: 4 days      Debbe Odea, MD [facility] Pager: [email or web address] 08/21/2020, 2:50 PM

## 2020-08-21 NOTE — Progress Notes (Signed)
Physical Therapy Treatment Patient Details Name: Michele Green MRN: [id number] DOB: September 12, 1941 Today's Date: 08/21/2020    History of Present Illness Pt is an 79 y.o. female admitted 08/16/20 with svere upper back pain after rolling over in bed the night before admission. MRI with acute or subacute T7 compression fx, mild paraspinal hematoma. Complicated by afib with RVR, mild pulmonary edema, new hypoxia. Neurosx recommending outpatient follow up for T7 fx. PMH includes breast CA, osteoporosis, persistent afib on Xarelto, HTN, MVR, CAD s/p CABG, CHF. Vertebroplasty on 9/28; increased HR during hospital stay    PT Comments    Pt with improved participation in session; MD stated pt must get into the chair today; pt performed with min guard assist; pt continues to be limited with functional mobility task due to elevated heart rate and increased anxiety with movement; pt continues to demonstrate deficits in balance, strength, coordination, bed mobility, gait, safety and endurance; pt will benefit from skilled PT to address deficits and maximize independence with functional mobility prior to discharge.    Follow Up Recommendations  SNF     Equipment Recommendations       Recommendations for Other Services       Precautions / Restrictions Precautions Precautions: Back;Other (comment) Precaution Booklet Issued: No Precaution Comments: Watch HR/O2 Required Braces or Orthoses: Spinal Brace Spinal Brace: Thoracolumbosacral orthotic;Applied in sitting position Restrictions Weight Bearing Restrictions: No    Mobility  Bed Mobility Overal bed mobility: Needs Assistance Bed Mobility: Supine to Sit   Sidelying to sit: Supervision;HOB elevated       General bed mobility comments: pt with improved log roll technique, able to recall using it; pt requiring min A and HOB elevated  Transfers Overall transfer level: Needs assistance Equipment used: Rolling walker (2 wheeled) Transfers:  Sit to/from [employer] Transfers   Stand pivot transfers: Min guard       General transfer comment: pt able to don/doff brace sitting SOB: pt able to correctly perform sit<>stand wtih RW min guard and perform stand step transfer to chair wtih RW with min guard  Ambulation/Gait                 Stairs             Wheelchair Mobility    Modified Rankin (Stroke Patients Only)       Balance                                            Cognition Arousal/Alertness: Awake/alert Behavior During Therapy: WFL for tasks assessed/performed Overall Cognitive Status: Within Functional Limits for tasks assessed                                        Exercises      General Comments        Pertinent Vitals/Pain Pain Assessment: Faces Faces Pain Scale: Hurts a little bit Pain Location: Back Pain Descriptors / Indicators: Discomfort;Guarding    Home Living                      Prior Function            PT Goals (current goals can now be found in the care plan section) Acute Rehab PT Goals Patient Stated  Goal: I have to go to rehab, this pain is too much. PT Goal Formulation: With patient Time For Goal Achievement: 08/31/20 Potential to Achieve Goals: Fair Progress towards PT goals: Progressing toward goals    Frequency    Min 2X/week      PT Plan Current plan remains appropriate    Co-evaluation              AM-PAC PT "6 Clicks" Mobility   Outcome Measure  Help needed turning from your back to your side while in a flat bed without using bedrails?: A Little Help needed moving from lying on your back to sitting on the side of a flat bed without using bedrails?: A Little Help needed moving to and from a bed to a chair (including a wheelchair)?: A Little Help needed standing up from a chair using your arms (e.g., wheelchair or bedside chair)?: A Little Help needed to walk in hospital room?: A  Little Help needed climbing 3-5 steps with a railing? : A Lot 6 Click Score: 17    End of Session Equipment Utilized During Treatment: Gait belt;Back brace Activity Tolerance: Patient tolerated treatment well;Patient limited by pain;Patient limited by fatigue Patient left: in chair;with call bell/phone within reach;with family/visitor present Nurse Communication: Mobility status PT Visit Diagnosis: Other abnormalities of gait and mobility (R26.89);Muscle weakness (generalized) (M62.81)     Time: 0931-1216 PT Time Calculation (min) (ACUTE ONLY): 17 min  Charges:  $Therapeutic Activity: 8-22 mins                     Lyanne Co, DPT Acute Rehabilitation Services [phone]   [place] 08/21/2020, 2:34 PM

## 2020-08-21 NOTE — Progress Notes (Signed)
Progress Note  Patient Name: Michele Green Date of Encounter: 08/21/2020  [facility] Cardiologist: Sinclair Grooms, MD   Subjective   Denies any dyspnea  Inpatient Medications    Scheduled Meds: . allopurinol  200 mg Oral QPM  . atorvastatin  20 mg Oral Daily  . digoxin  0.125 mg Oral Daily  . feeding supplement (ENSURE ENLIVE)  237 mL Oral BID BM  . mesalamine  1,500 mg Oral Daily  . metoprolol tartrate  100 mg Oral BID  . multivitamin with minerals  1 tablet Oral Daily  . pantoprazole  40 mg Oral Daily  . predniSONE  20 mg Oral Daily  . senna-docusate  2 tablet Oral BID   Continuous Infusions: . heparin 700 Units/hr (08/20/20 1240)   PRN Meds: acetaminophen, ALPRAZolam, HYDROmorphone (DILAUDID) injection, iohexol, ondansetron (ZOFRAN) IV, oxyCODONE   Vital Signs    Vitals:   08/20/20 2036 08/21/20 0031 08/21/20 0454 08/21/20 0815  BP: 109/79 104/72 113/80 100/60  Pulse: 98 (!) 118 (!) 120 (!) 121  Resp: 18 18 18 15   Temp: 98 F (36.7 C) 98.7 F (37.1 C) 97.6 F (36.4 C) 98 F (36.7 C)  TempSrc: Oral Oral Oral   SpO2: 96% 98% 97% 100%  Weight:   55.4 kg   Height:        Intake/Output Summary (Last 24 hours) at 08/21/2020 0825 Last data filed at 08/21/2020 0600 Gross per 24 hour  Intake 481.33 ml  Output 650 ml  Net -168.67 ml   Last 3 Weights 08/21/2020 08/20/2020 08/19/2020  Weight (lbs) 122 lb 2.2 oz 119 lb 14.9 oz 123 lb 12.8 oz  Weight (kg) 55.4 kg 54.4 kg 56.155 kg      Telemetry    Atrial flutter with rates 120s.- Personally Reviewed  ECG    No new EKG- Personally Reviewed  Physical Exam   GEN: No acute distress.   Neck: No JVD Cardiac: regular, tachycardic, no murmurs Respiratory: Clear to auscultation bilaterally. GI: Soft, nontender MS: No edema Neuro:  Nonfocal  Psych: Normal affect   Labs    High Sensitivity Troponin:   Recent Labs  Lab 08/16/20 1452 08/16/20 2352 08/17/20 0539 08/17/20 0614  TROPONINIHS 24*  27* 37* 33*      Chemistry Recent Labs  Lab 08/16/20 1452 08/17/20 0614 08/18/20 0459 08/19/20 0454 08/20/20 1004  NA 138   < > 138 139 137  K 4.2   < > 3.9 3.7 3.7  CL 103   < > 108 105 103  CO2 22   < > 23 25 25   GLUCOSE 111*   < > 84 81 95  BUN 20   < > 14 10 10   CREATININE 0.82   < > 0.86 0.73 0.80  CALCIUM 8.9   < > 8.1* 8.2* 8.0*  PROT 7.1  --   --   --   --   ALBUMIN 3.7  --   --   --   --   AST 34  --   --   --   --   ALT 37  --   --   --   --   ALKPHOS 44  --   --   --   --   BILITOT 0.7  --   --   --   --   GFRNONAA >60   < > >60 >60 >60  GFRAA >60   < > >60 >60 >60  ANIONGAP  13   < > 7 9 9    < > = values in this interval not displayed.     Hematology Recent Labs  Lab 08/18/20 0459 08/19/20 0454 08/20/20 1004  WBC 6.8 6.2 7.2  RBC 3.65* 3.62* 3.75*  HGB 12.3 12.1 12.2  HCT 38.4 38.3 39.4  MCV 105.2* 105.8* 105.1*  MCH 33.7 33.4 32.5  MCHC 32.0 31.6 31.0  RDW 15.4 15.2 15.0  PLT 103* 103* 120*    BNP Recent Labs  Lab 08/16/20 1452  BNP 482.0*     DDimer No results for input(s): DDIMER in the last 168 hours.   Radiology    IR VERTEBROPLASTY CERV/THOR BX INC UNI/BIL INC/INJECT/IMAGING  Result Date: 08/20/2020 INDICATION: Severe midthoracic pain secondary to compression fracture at T7. EXAM: VERTEBROPLASTY AT T7 MEDICATIONS: As antibiotic prophylaxis, Ancef 2 g IV was ordered pre-procedure and administered intravenously within 1 hour of incision. ANESTHESIA/SEDATION: Moderate (conscious) sedation was employed during this procedure. A total of Versed 1 mg and Fentanyl 25 mcg was administered intravenously. Moderate Sedation Time: 28 minutes. The patient's level of consciousness and vital signs were monitored continuously by radiology nursing throughout the procedure under my direct supervision. FLUOROSCOPY TIME:  Fluoroscopy Time: 9 minutes 12 seconds (756 mGy) COMPLICATIONS: None immediate. TECHNIQUE: Informed written consent was obtained from the  patient after a thorough discussion of the procedural risks, benefits and alternatives. All questions were addressed. Maximal Sterile Barrier Technique was utilized including caps, mask, sterile gowns, sterile gloves, sterile drape, hand hygiene and skin antiseptic. A timeout was performed prior to the initiation of the procedure. PROCEDURE: The patient was placed prone on the fluoroscopic table. Nasal oxygen was administered. Physiologic monitoring was performed throughout the duration of the procedure. The skin overlying the thoracic region was prepped and draped in the usual sterile fashion. The T7 vertebral body was identified and the right pedicle was infiltrated with 0.25% Bupivacaine. This was then followed by the advancement of a 13-gauge Cook needle through the right pedicle into the anterior one-third at T7. A gentle contrast injection demonstrated a trabecular pattern of contrast with simultaneous opacification of the paraspinous veins. Gelfoam pledgets were then infused through the 13 gauge Cook spinal needle prior to the delivery of the methylmethacrylate mixture. At this time, methylmethacrylate mixture was reconstituted. Under biplane intermittent fluoroscopy, the methylmethacrylate was then injected into the T7 vertebral body with filling of the vertebral body. No extravasation was noted into the disk spaces or posteriorly into the spinal canal. No epidural venous contamination was seen. The needle was then removed. Hemostasis was achieved at the skin entry site. There were no acute complications. Patient tolerated the procedure well. The patient was then returned to the floor in stable and good condition. IMPRESSION: 1. Status post vertebral body augmentation for painful compression fracture at T7 using vertebroplasty technique. Electronically Signed   By: Luanne Bras M.D.   On: 08/19/2020 16:21    Cardiac Studies   Echo 09/05/19: 1. Left ventricular ejection fraction, by visual  estimation, is 60 to  65%. The left ventricle has normal function. There is moderately increased  left ventricular hypertrophy.  2. Global right ventricle has moderately reduced systolic function.The  right ventricular size is normal. No increase in right ventricular wall  thickness.  3. Left atrial size was severely dilated.  4. Right atrial size was normal.  5. The mitral valve has been repaired/replaced. Mild mitral valve  regurgitation. Mild-moderate mitral stenosis.  6. MV peak gradient, 19.4 mmHg.  HR 117 during this study.  7. The tricuspid valve is grossly normal. Tricuspid valve regurgitation  is mild.  8. The aortic valve is tricuspid Aortic valve regurgitation was not  visualized by color flow Doppler.  9. The pulmonic valve was grossly normal. Pulmonic valve regurgitation is  trivial by color flow Doppler.  10. Moderately elevated pulmonary artery systolic pressure.  11. The inferior vena cava is dilated in size with <50% respiratory  variability, suggesting right atrial pressure of 15 mmHg.   Patient Profile     79 y.o. female with a hx of CAD s/p CABG with LIMA-LAD and severe MR s/p repair September 2019, breast cancer, HTN, afib prior to open heart surgery, Maze procedure without atrial appendage ligation September 2019, recurrent Afib/flutter post surgery despite amiodarone therapy and cardioversion November 2019 with plan to rate control who is being seen today for the evaluation of anticoagulation in the setting of spinal hematoma at the request of Dr. Louanne Belton.  Assessment & Plan    Hypotension: SBP down to 70s yesterday, improved to 100/60 this AM.  She is asymptomatic.  Suspect hypovolemic as net negative 1.2L prior day on PO lasix 20, likely due to being NPO for procedure.  Given 250 cc bolus 9/29.  Appears resolved.  Persistent atypical Afib/flutter: She has failed multiple therapies including Maze procedure, amio and cardioversion. Not a ablation candidate.  LA severely dilated.  Currently in AFL with RVR - CHADSVASC of 5.  On Xarelto >>held for spinal hematoma.  OK to restart AC per neurosurgery, started heparin gtt .  Recommend transitioning to Xarelto if OK per neurosurgery/IR - Metoprolol 100 mg BID - Given soft BP, loaded with digoxin yesterday and will continue digoxin 0.125 mg daily, check digoxin level in 1 week  T7 compression fracture with mild paraspinal hematoma: anticoagulation held initiallly, currently on heparin gtt.  S/p vertebroplasty yesterday - Heparin gtt restarted, can transition to home Xarelto once OK form IR/neurosurgery perspective  Elevated troponin with known CAD s/p CABG:HS troponin 37>33.  Denies chest pain - likely demand ischemia in the setting of afib RVR - continue statin and BB  Chronic diastolic CHF: BNP 557, imaging with mild pulmonary edema.  Home lasix was restarted, held 9/29 due to hypotension - Holding lasix given hypotension as above   For questions or updates, please contact [place] Please consult [email or web address] for contact info under        Signed, Donato Heinz, MD  08/21/2020, 8:25 AM

## 2020-08-22 ENCOUNTER — Telehealth: Payer: Self-pay | Admitting: *Deleted

## 2020-08-22 DIAGNOSIS — M549 Dorsalgia, unspecified: Secondary | ICD-10-CM

## 2020-08-22 DIAGNOSIS — S22060A Wedge compression fracture of T7-T8 vertebra, initial encounter for closed fracture: Secondary | ICD-10-CM

## 2020-08-22 DIAGNOSIS — Z7189 Other specified counseling: Secondary | ICD-10-CM

## 2020-08-22 DIAGNOSIS — Z515 Encounter for palliative care: Secondary | ICD-10-CM

## 2020-08-22 LAB — CULTURE, BLOOD (ROUTINE X 2)
Culture: NO GROWTH
Culture: NO GROWTH

## 2020-08-22 LAB — CBC
HCT: 36.7 % (ref 36.0–46.0)
Hemoglobin: 12.1 g/dL (ref 12.0–15.0)
MCH: 33.9 pg (ref 26.0–34.0)
MCHC: 33 g/dL (ref 30.0–36.0)
MCV: 102.8 fL — ABNORMAL HIGH (ref 80.0–100.0)
Platelets: 103 10*3/uL — ABNORMAL LOW (ref 150–400)
RBC: 3.57 MIL/uL — ABNORMAL LOW (ref 3.87–5.11)
RDW: 14.5 % (ref 11.5–15.5)
WBC: 5.3 10*3/uL (ref 4.0–10.5)
nRBC: 0 % (ref 0.0–0.2)

## 2020-08-22 LAB — VITAMIN D 25 HYDROXY (VIT D DEFICIENCY, FRACTURES): Vit D, 25-Hydroxy: 22.13 ng/mL — ABNORMAL LOW (ref 30–100)

## 2020-08-22 LAB — HEPARIN LEVEL (UNFRACTIONATED): Heparin Unfractionated: 0.17 IU/mL — ABNORMAL LOW (ref 0.30–0.70)

## 2020-08-22 LAB — SARS CORONAVIRUS 2 BY RT PCR (HOSPITAL ORDER, PERFORMED IN ~~LOC~~ HOSPITAL LAB): SARS Coronavirus 2: NEGATIVE

## 2020-08-22 MED ORDER — RIVAROXABAN 15 MG PO TABS
15.0000 mg | ORAL_TABLET | Freq: Every day | ORAL | Status: DC
  Administered 2020-08-22 – 2020-08-23 (×2): 15 mg via ORAL
  Filled 2020-08-22 (×2): qty 1

## 2020-08-22 MED ORDER — OXYCODONE HCL 5 MG PO TABS
5.0000 mg | ORAL_TABLET | ORAL | 0 refills | Status: DC | PRN
Start: 2020-08-22 — End: 2020-09-12

## 2020-08-22 NOTE — Discharge Instructions (Signed)
1.No stooping ,or bending or lifting more than 10 lbs for 2 weeks. 2.Use walker to ambulate for 2 weeks. 3.No driving for 2 weeks. 4.RTC 2 weeks    Information on my medicine - XARELTO (Rivaroxaban)  Why was Xarelto prescribed for you? Xarelto was prescribed for you to reduce the risk of a blood clot forming that can cause a stroke if you have a medical condition called atrial fibrillation (a type of irregular heartbeat).  What do you need to know about xarelto ? Take your Xarelto ONCE DAILY at the same time every day with your evening meal. If you have difficulty swallowing the tablet whole, you may crush it and mix in applesauce just prior to taking your dose.  Take Xarelto exactly as prescribed by your doctor and DO NOT stop taking Xarelto without talking to the doctor who prescribed the medication.  Stopping without other stroke prevention medication to take the place of Xarelto may increase your risk of developing a clot that causes a stroke.  Refill your prescription before you run out.  After discharge, you should have regular check-up appointments with your healthcare provider that is prescribing your Xarelto.  In the future your dose may need to be changed if your kidney function or weight changes by a significant amount.  What do you do if you miss a dose? If you are taking Xarelto ONCE DAILY and you miss a dose, take it as soon as you remember on the same day then continue your regularly scheduled once daily regimen the next day. Do not take two doses of Xarelto at the same time or on the same day.   Important Safety Information A possible side effect of Xarelto is bleeding. You should call your healthcare provider right away if you experience any of the following: ? Bleeding from an injury or your nose that does not stop. ? Unusual colored urine (red or dark brown) or unusual colored stools (red or black). ? Unusual bruising for unknown reasons. ? A serious fall or  if you hit your head (even if there is no bleeding).  Some medicines may interact with Xarelto and might increase your risk of bleeding while on Xarelto. To help avoid this, consult your healthcare provider or pharmacist prior to using any new prescription or non-prescription medications, including herbals, vitamins, non-steroidal anti-inflammatory drugs (NSAIDs) and supplements.  This website has more information on Xarelto: [email or web address]

## 2020-08-22 NOTE — Progress Notes (Signed)
Occupational Therapy Treatment Patient Details Name: MAYZEE REICHENBACH MRN: [id number] DOB: 1941-06-19 Today's Date: 08/22/2020    History of present illness Pt is an 79 y.o. female admitted 08/16/20 with svere upper back pain after rolling over in bed the night before admission. MRI with acute or subacute T7 compression fx, mild paraspinal hematoma. Complicated by afib with RVR, mild pulmonary edema, new hypoxia. Neurosx recommending outpatient follow up for T7 fx. PMH includes breast CA, osteoporosis, persistent afib on Xarelto, HTN, MVR, CAD s/p CABG, CHF. Vertebroplasty on   OT comments  Pt endorses soreness in her back. Transferred to [facility] and back to bed with hand held assist and performed seated grooming at EOB. Pt stated she got up to chair yesterday and "it felt great," but declined remaining up in chair at end of session. Pt agreed to OOB to chair with nursing staff later today.   Follow Up Recommendations  SNF    Equipment Recommendations  None recommended by OT    Recommendations for Other Services      Precautions / Restrictions Precautions Precautions: Back;Fall;Other (comment) Precaution Booklet Issued: No Precaution Comments: watch HR Required Braces or Orthoses: Spinal Brace Spinal Brace: Thoracolumbosacral orthotic;Applied in sitting position       Mobility Bed Mobility Overal bed mobility: Needs Assistance Bed Mobility: Sidelying to Sit;Sit to Sidelying   Sidelying to sit: Supervision;HOB elevated     Sit to sidelying: Supervision General bed mobility comments: recalled log roll technique  Transfers Overall transfer level: Needs assistance Equipment used: 1 person hand held assist Transfers: Sit to/from [facility] Sit to Stand: Min guard Stand pivot transfers: Min assist       General transfer comment: hand held assist to transfer to [facility] and back to chair    Balance Overall balance assessment: Needs assistance   Sitting  balance-Leahy Scale: Good       Standing balance-Leahy Scale: Poor Standing balance comment: reliant on UE support                           ADL either performed or assessed with clinical judgement   ADL Overall ADL's : Needs assistance/impaired     Grooming: Wash/dry hands;Set up;Sitting                   Toilet Transfer: Minimal assistance;Stand-pivot;BSC   Toileting- Clothing Manipulation and Hygiene: Set up;Sitting/lateral lean         General ADL Comments: pt declined up to chair     Vision       Perception     Praxis      Cognition Arousal/Alertness: Awake/alert Behavior During Therapy: WFL for tasks assessed/performed Overall Cognitive Status: Within Functional Limits for tasks assessed                                 General Comments: pt realistic that she may not be able to manage on her own at home, reports her son is looking for an ALF         Exercises     Shoulder Instructions       General Comments      Pertinent Vitals/ Pain       Pain Assessment: Faces Faces Pain Scale: Hurts a little bit Pain Location: Back Pain Descriptors / Indicators: Sore  Home Living  Prior Functioning/Environment              Frequency  Min 2X/week        Progress Toward Goals  OT Goals(current goals can now be found in the care plan section)  Progress towards OT goals: Progressing toward goals  Acute Rehab OT Goals Patient Stated Goal: rehab and then return home if she is able OT Goal Formulation: With patient Time For Goal Achievement: 09/01/20 Potential to Achieve Goals: Good  Plan Discharge plan remains appropriate    Co-evaluation                 AM-PAC OT "6 Clicks" Daily Activity     Outcome Measure   Help from another person eating meals?: None Help from another person taking care of personal grooming?: A Little Help from another  person toileting, which includes using toliet, bedpan, or urinal?: A Little Help from another person bathing (including washing, rinsing, drying)?: A Lot Help from another person to put on and taking off regular upper body clothing?: A Little Help from another person to put on and taking off regular lower body clothing?: A Lot 6 Click Score: 17    End of Session Equipment Utilized During Treatment: Gait belt  OT Visit Diagnosis: Unsteadiness on feet (R26.81);Other abnormalities of gait and mobility (R26.89);Muscle weakness (generalized) (M62.81);History of falling (Z91.81);Dizziness and giddiness (R42);Pain   Activity Tolerance Patient tolerated treatment well   Patient Left in bed;with call bell/phone within reach   Nurse Communication          Time: 5003-7048 OT Time Calculation (min): 16 min  Charges: OT General Charges $OT Visit: 1 Visit OT Treatments $Self Care/Home Management : 8-22 mins  Nestor Lewandowsky, OTR/L Acute Rehabilitation Services Pager: [phone] Office: [phone]   Malka So 08/22/2020, 9:10 AM

## 2020-08-22 NOTE — Progress Notes (Signed)
   08/21/20 2319  Assess: MEWS Score  Temp 97.7 F (36.5 C)  BP 105/75  Pulse Rate (!) 120  Resp 20  SpO2 98 %  O2 Device Nasal Cannula  O2 Flow Rate (L/min) 1 L/min  Assess: MEWS Score  MEWS Temp 0  MEWS Systolic 0  MEWS Pulse 2  MEWS RR 0  MEWS LOC 0  MEWS Score 2  MEWS Score Color Yellow  Assess: if the MEWS score is Yellow or Red  Were vital signs taken at a resting state? Yes  Focused Assessment No change from prior assessment  Early Detection of Sepsis Score *See Row Information* Low  MEWS guidelines implemented *See Row Information* No, previously yellow, continue vital signs every 4 hours  Document  Patient Outcome Stabilized after interventions  Progress note created (see row info) Yes

## 2020-08-22 NOTE — Progress Notes (Signed)
ANTICOAGULATION CONSULT NOTE - Initial Consult  Pharmacy Consult for Xarelto Indication: atrial fibrillation  Allergies  Allergen Reactions  . Shellfish Allergy Itching    Patient Measurements: Height: 5' 4"  (162.6 cm) Weight: 56.2 kg (123 lb 14.4 oz) IBW/kg (Calculated) : 54.7  Vital Signs: Temp: 97.6 F (36.4 C) (10/01 0512) Temp Source: Oral (10/01 0512) BP: 116/78 (10/01 0512) Pulse Rate: 121 (10/01 0512)  Labs: Recent Labs    08/20/20 1004 08/20/20 1004 08/21/20 0839 08/22/20 0615  HGB 12.2   < > 10.7* 12.1  HCT 39.4  --  33.7* 36.7  PLT 120*  --  97* 103*  HEPARINUNFRC 0.29*  --  0.32 0.17*  CREATININE 0.80  --   --   --    < > = values in this interval not displayed.    Estimated Creatinine Clearance: 47.6 mL/min (by C-G formula based on SCr of 0.8 mg/dL).   Medical History: Past Medical History:  Diagnosis Date  . Arrhythmia   . Atypical atrial flutter ([place]) 08/14/2018   Post-operative  . Breast cancer of upper-outer quadrant of left female breast ([place]) 02/02/2016  . CAD (coronary artery disease) 06/20/2018   LHC 7/19: pLAD 65/90, oD1 90, mLCx 85, OM2 50, oRCA 30, EF 50-55 >> s/p CABG in 9/19 (L-LAD)  . Colon polyp 02/2012  . Dupuytren contracture    bilateral hands  . Family history of breast cancer   . Hyperlipidemia   . Hypertension   . Mitral regurgitation 11/07/2013   Echo 7/19: Mild LVH, EF 60-65, no RWMA, mod ot severe MR, massive LAE, PASP 33 // TEE 7/19:  Mild conc LVH, EF 60-65, no RWMA, severe MR with mild post leaflet prolapse, massive // s/p MV repair 07/2018  . On continuous oral anticoagulation 08/22/2015   Started on Xarelto 08/12/2015   . Osteopenia   . Persistent atrial fibrillation ([place]) 08/22/2015   Started late August or early September 2016 // s/p Maze procedure 07/2018  . Personal history of radiation therapy   . Radiation Therapy 04/21/16-05/19/16   left breast 47.72 Gy, boosted to 10 Gy  . 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   LIMA to LAD   . S/P Maze operation for atrial fibrillation 08/10/2018   Complete bilateral atrial lesion set using bipolar radiofrequency and cryothermy ablation with clipping of LA appendage  . S/P MVR (mitral valve repair) 08/10/2018   Complex valvuloplasty including Gore-tex neochord placement x8, Plication of Lateral Commissure and 32m Sorin Memo 4D Ring Annuloplasty SN# [id number] . [place]   . Wears glasses     Assessment: Anticoag: SCDs, LMWH>IV heparin for afib  PTA Xarelto on hold d/t paraspinal hematoma. CHADS2VASc 5. In aflutter. Resume low dose Xarelto 10/1, noted thrombocytopenia stable. Hgb up to 12.1 - 9/28: Vertebroplasty. Resume heparin post-IR  Goal of Therapy:  Therapeutic oral anticoagulation   Plan:  D/c IV heparin Xarelto 159mfor CrCl <50 Started digoxin load 9/29. Level in 1 week.   Kalissa Grays S. RoAlford HighlandPharmD, BCPS Clinical Staff Pharmacist [email or web address] RoAlford HighlandCrThe Timken Company0/11/2019,7:47 AM

## 2020-08-22 NOTE — Progress Notes (Signed)
CSW spoke with pt's son [name], stated he is not going to be back in town if pt dc tomorrow. [name] stated his aunt Seward Meth [phone] will be the point of contact at dc.

## 2020-08-22 NOTE — Consult Note (Signed)
Palliative Medicine Inpatient Consult Note  Reason for consult:  Goals of Care  HPI:  Per intake H&P --> Michele Green is a 79 y.o.femalewithpastmedical history significant forbreast cancer, osteoporosis,persistent A. Fib/ flutter s/p Maze on Xarelto and rate control, essential hypertension,mitral valve repair,coronary artery disease status post CABG, chronic diastolic CHF, [facility] to[facility] upper back pain after rolling over in bed the night prior to her presentation. Deniedany trauma.She wasfound to have an acuteor subacuteT7 compression fracture on MRI. Normal cord signal intensity, no cord lesions or syrinx. Mild paraspinal hematoma at T7.Patient also had atrial fibrillation withRVR,mildpulmonary edema on chest x-ray,and newhypoxiafor which she was transferred to [facility] further evaluation and management. Troponinwasmildly elevated, peaked at 27.Patient was then admitted to hospital for further evaluation and treatment.  Palliative care was asked to see Michele Green based upon her 12 month mortality score and comorbidities.  Clinical Assessment/Goals of Care: I have reviewed medical records including EPIC notes, labs and imaging, received report from bedside RN, assessed the patient who was alert and oriented lying in bed.    I met with Michele Green to further discuss diagnosis prognosis, GOC, EOL wishes, disposition and options.   I introduced Palliative Medicine as specialized medical care for people living with serious illness. It focuses on providing relief from the symptoms and stress of a serious illness. The goal is to improve quality of life for both the patient and the family.  I asked Michele Green to tell me about herself. She shares that she is from [place]. She is a widow - her husband passed away. They share one son who lives in the [place] area. 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 She use to enjoy going out to eat with friends prior to the pandemic. 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 [place]. She is a faithful woman and practices within the [employer] denomination.  In terms of functional status, Michele Green endorses that she is 1 but lives independently and has much quality to her life. She shares that her hope is to go to rehabilitation so that she may function independently as before. She remains active in the community and is still driving.   A detailed discussion was had today regarding advanced directives, patient has a living will on file though no POA documentation. By default of the state her son would be her surrogate decisions maker - she is in agreement with this. She states that he lives two hours away though he is able to travel if she requires him.   Concepts specific to code status, artifical feeding and hydration, continued IV antibiotics and rehospitalization was had. I completed a MOST form today. The patient and family outlined their wishes for the following treatment decisions:  Cardiopulmonary Resuscitation: Do Not Attempt Resuscitation (DNR/No CPR)  Medical Interventions: Limited Additional Interventions: Use medical treatment, IV fluids and cardiac monitoring as indicated, DO NOT USE intubation or mechanical ventilation. May consider use of less invasive airway support such as BiPAP or CPAP. Also provide comfort measures. Transfer to the hospital if indicated. Avoid intensive care.   Antibiotics: Determine use of limitation of antibiotics when infection occurs  IV Fluids: IV fluids for a defined trial period  Feeding Tube: No feeding tube   The difference between a aggressive medical intervention path  and a  palliative comfort care path for this patient at this time was had. Values and goals of care important to  patient and family were attempted to be elicited. Patient endorses being fiercly independent and hoping to get back to that. We discussed the plan for her to go to [place] [facility] to gain strength. She is hopeful to remain self sufficient and functional for as long as possible.   Discussed the importance of continued conversation with family and their  medical providers regarding overall plan of care and treatment options, ensuring decisions are within the context of the patients values and GOCs.  Decision Maker: Michele Green (son) [phone]  SUMMARY OF RECOMMENDATIONS   DNAR/DNI  Treat what is Treatable  Spiritual Support  Goal: To transition to [facility] place to gain strength  Code Status/Advance Care Planning: DNAR/DNI   Palliative Prophylaxis:   Oral Care, Mobility  Additional Recommendations (Limitations, Scope, Preferences):  Treat What is Treatable   Psycho-social/Spiritual:   Desire for further Chaplaincy support: Yes - [facility]  Additional Recommendations: Education on chronic disease - heart failure   Prognosis: Increased 12 month mortality risk based upon comorbidities - plan for rehabilitation  Discharge Planning: Discharge to [place]: 40-50%   This conversation/these recommendations were discussed with patient primary care team, Dr. Wynelle Cleveland  Time In: 1600 Time Out: 1710 Total Time: 70 Greater than 50%  of this time was spent counseling and coordinating care related to the above assessment and plan.  [place] Team Team Cell Phone: [phone] Please utilize secure chat with additional questions, if there is no response within 30 minutes please call the above phone number  Palliative Medicine Team providers are available by phone from 7am to 7pm daily and can be reached through the team cell phone.  Should this patient require assistance outside of these hours, please call the patient's attending  physician.

## 2020-08-22 NOTE — Telephone Encounter (Signed)
LM on sister's phone for Pat to hold tamoxifen due to risk of thrombosis. We will schedule a follow up in 1-2 months to decide about restarting.   Pt to be transferred to [facility] post hospitalization  Message to scheduler

## 2020-08-22 NOTE — Progress Notes (Signed)
PROGRESS NOTE    Michele Green   PXT:[id number]  DOB: Dec 18, 1940  DOA: 08/16/2020 PCP: [name], MD   Brief Narrative:  Michele Green is a 79 y.o.femalewith past medical history significant forbreast cancer, osteoporosis,persistent A. Fib/ flutter s/p Maze on Xarelto and rate control, essential hypertension,mitral valve repair,coronary artery disease status post CABG, chronic diastolic CHF, ulcerative colitis presented too [facility] ED due tosevere upper back pain after rolling over in bed the night prior to her presentation. Denied any trauma.  She was found to have an acuteor subacuteT7 compression fracture on MRI. Normal cord signal intensity, no cord lesions or syrinx. Mild paraspinal hematoma at T7.   Patient also had atrial fibrillation with RVR,mildpulmonary edema on chest x-ray,and newhypoxiafor which she was transferred to [facility] further evaluation and management. Troponin was mildly elevated, peaked at 27.  Patient was then admitted to hospital for further evaluation and treatment.   Subjective: No new complaints.     Assessment & Plan:   Principal Problem:   Non traumatic spinal fracture of T7 vertebra in setting of osteoporosis - NS and IR consulted - has undergone a vertebroplasty on 9/28- continues to have "soreness" in the area- pain is more 4/10 now. -  PT recommending SNF -Continue pain medications as needed (Oxycodone) - start Calcium, check Vit D level and PCP can consider starting bisphophonate in the next couple of weeks - of note, she is on Prednisone for UC - Dr Marcello Moores from [facility] states that she can follow up with their group for an xray in 2 wks  Active Problems:    Persistent atrial fibrillation with RVR  S/P Maze operation   -Cardiology attempting to [facility] is having some low blood pressures which are making it difficult to manage her rate- Dig loading per cardiology - on  IV heparin for procedure on 9/28-  switched to Xarelto today - Cardiology would like to keep her at least 1 more day  Chronic thrombocytopenia    Chronic diastolic HF (heart failure)  -Compensated- lasix on hold as she is hypotensive    S/P CABG x 1  Ulcerative colitis Continue prednisone and mesalamine- patient states Dr Therisa Doyne increased her Prednisone to 20 mg and plans to f/u in Nov to see if it should be reduced  Breast cancer -Her oncologist Dr. Burr Medico has contacted me and states that we should hold tamoxifen  for 1 to 2 months to prevent DVT  Disposition: son,  feel she would benefit from ALF after SNF- he states that the patient has declined this in the past and prefers to live alone> I have updated her son today in regards to her medical issues. He asks that I send him an email with the updates as well. His mother has given me permission to do this.    Time spent in minutes: 35 DVT prophylaxis: Heparin infusion Code Status: Full code Family Communication: plan discussed with son,  Michele Green Disposition Plan:  Status is: Inpatient  Remains inpatient appropriate because:Treating A. fib with RVR, needs at least 1 more day per cardiology as HR still high   Dispo:  Patient From: Home  Planned Disposition: [place]  Expected discharge date: 08/23/20  Medically stable for discharge: No  Consultants:   Cardiology  IR  NS  Procedures:   Kyphoplasty 08/19/20 Antimicrobials:  Anti-infectives (From admission, onward)   Start     Dose/Rate Route Frequency Ordered Stop   08/19/20 1314  tobramycin (NEBCIN) 1.2  g powder       Note to Pharmacy: [place], Heather   : cabinet override      08/19/20 1314 08/20/20 0129   08/19/20 0600  ceFAZolin (ANCEF) IVPB 2g/100 mL premix        2 g 200 mL/hr over 30 Minutes Intravenous On call 08/18/20 1414 08/19/20 1359       Objective: Vitals:   08/21/20 2319 08/22/20 0512 08/22/20 0824 08/22/20 1138  BP: 105/75 116/78 114/80 112/81  Pulse: (!) 120  (!) 121 (!) 122 (!) 122  Resp: 20 18 18 17   Temp: 97.7 F (36.5 C) 97.6 F (36.4 C)  97.6 F (36.4 C)  TempSrc: Oral Oral    SpO2: 98% 98% 99% 99%  Weight:  56.2 kg    Height:        Intake/Output Summary (Last 24 hours) at 08/22/2020 1622 Last data filed at 08/22/2020 1343 Gross per 24 hour  Intake 954.63 ml  Output 1451 ml  Net -496.37 ml   Filed Weights   08/20/20 0449 08/21/20 0454 08/22/20 0512  Weight: 54.4 kg 55.4 kg 56.2 kg    Examination: General exam: Appears comfortable  HEENT: PERRLA, oral mucosa moist, no sclera icterus or thrush Respiratory system: Clear to auscultation. Respiratory effort normal. Cardiovascular system: S1 & S2 heard,  No murmurs - IIRR, HR in 110-120s Gastrointestinal system: Abdomen soft, non-tender, nondistended. Normal bowel sounds  Central nervous system: Alert and oriented. No focal neurological deficits. Extremities: No cyanosis, clubbing or edema Skin: No rashes or ulcers Psychiatry:  Mood & affect appropriate.     Data Reviewed: I have personally reviewed following labs and imaging studies  CBC: Recent Labs  Lab 08/16/20 1452 08/16/20 1452 08/17/20 0614 08/17/20 0614 08/18/20 0459 08/19/20 0454 08/20/20 1004 08/21/20 0839 08/22/20 0615  WBC 16.4*   < > 10.7*   < > 6.8 6.2 7.2 3.7* 5.3  NEUTROABS 13.6*  --  8.0*  --  4.8 4.1  --   --   --   HGB 13.7   < > 12.8   < > 12.3 12.1 12.2 10.7* 12.1  HCT 43.6   < > 39.9   < > 38.4 38.3 39.4 33.7* 36.7  MCV 105.1*   < > 103.1*   < > 105.2* 105.8* 105.1* 103.4* 102.8*  PLT 157   < > 128*   < > 103* 103* 120* 97* 103*   < > = values in this interval not displayed.   Basic Metabolic Panel: Recent Labs  Lab 08/16/20 1452 08/17/20 0614 08/18/20 0459 08/19/20 0454 08/20/20 1004  NA 138 141 138 139 137  K 4.2 3.9 3.9 3.7 3.7  CL 103 105 108 105 103  CO2 22 24 23 25 25   GLUCOSE 111* 83 84 81 95  BUN 20 16 14 10 10   CREATININE 0.82 0.81 0.86 0.73 0.80  CALCIUM 8.9 8.4* 8.1*  8.2* 8.0*  MG  --   --   --  1.8 1.7   GFR: Estimated Creatinine Clearance: 47.6 mL/min (by C-G formula based on SCr of 0.8 mg/dL). Liver Function Tests: Recent Labs  Lab 08/16/20 1452  AST 34  ALT 37  ALKPHOS 44  BILITOT 0.7  PROT 7.1  ALBUMIN 3.7   No results for input(s): LIPASE, AMYLASE in the last 168 hours. No results for input(s): AMMONIA in the last 168 hours. Coagulation Profile: Recent Labs  Lab 08/18/20 1218  INR 1.2   Cardiac Enzymes: No results  for input(s): CKTOTAL, CKMB, CKMBINDEX, TROPONINI in the last 168 hours. BNP (last 3 results) No results for input(s): PROBNP in the last 8760 hours. HbA1C: No results for input(s): HGBA1C in the last 72 hours. CBG: Recent Labs  Lab 08/17/20 0100 08/17/20 1710  GLUCAP 92 91   Lipid Profile: No results for input(s): CHOL, HDL, LDLCALC, TRIG, CHOLHDL, LDLDIRECT in the last 72 hours. Thyroid Function Tests: No results for input(s): TSH, T4TOTAL, FREET4, T3FREE, THYROIDAB in the last 72 hours. Anemia Panel: No results for input(s): VITAMINB12, FOLATE, FERRITIN, TIBC, IRON, RETICCTPCT in the last 72 hours. Urine analysis:    Component Value Date/Time   COLORURINE YELLOW 08/17/2020 [place] 08/17/2020 0434   LABSPEC 1.011 08/17/2020 0434   PHURINE 5.0 08/17/2020 0434   GLUCOSEU NEGATIVE 08/17/2020 0434   HGBUR NEGATIVE 08/17/2020 0434   BILIRUBINUR NEGATIVE 08/17/2020 [place] 08/17/2020 0434   PROTEINUR NEGATIVE 08/17/2020 0434   NITRITE NEGATIVE 08/17/2020 0434   LEUKOCYTESUR NEGATIVE 08/17/2020 0434   Sepsis Labs: @LABRCNTIP (procalcitonin:4,lacticidven:4) ) Recent Results (from the past 240 hour(s))  Respiratory Panel by RT PCR (Flu A&B, Covid) - Nasopharyngeal Swab     Status: None   Collection Time: 08/16/20  7:24 PM   Specimen: Nasopharyngeal Swab  Result Value Ref Range Status   SARS Coronavirus 2 by RT PCR NEGATIVE NEGATIVE Final    Comment: (NOTE) SARS-CoV-2  target nucleic acids are NOT DETECTED.  The SARS-CoV-2 RNA is generally detectable in upper respiratoy specimens during the acute phase of infection. The lowest concentration of SARS-CoV-2 viral copies this assay can detect is 131 copies/mL. A negative result does not preclude SARS-Cov-2 infection and should not be used as the sole basis for treatment or other patient management decisions. A negative result may occur with  improper specimen collection/handling, submission of specimen other than nasopharyngeal swab, presence of viral mutation(s) within the areas targeted by this assay, and inadequate number of viral copies (<131 copies/mL). A negative result must be combined with clinical observations, patient history, and epidemiological information. The expected result is Negative.  Fact Sheet for Patients:  PinkCheek.be  Fact Sheet for Healthcare Providers:  GravelBags.it  This test is no t yet approved or cleared by the [place] FDA and  has been authorized for detection and/or diagnosis of SARS-CoV-2 by FDA under an Emergency Use Authorization (EUA). This EUA will remain  in effect (meaning this test can be used) for the duration of the COVID-19 declaration under Section 564(b)(1) of the Act, 21 U.S.C. section 360bbb-3(b)(1), unless the authorization is terminated or revoked sooner.     Influenza A by PCR NEGATIVE NEGATIVE Final   Influenza B by PCR NEGATIVE NEGATIVE Final    Comment: (NOTE) The Xpert Xpress SARS-CoV-2/FLU/RSV assay is intended as an aid in  the diagnosis of influenza from Nasopharyngeal swab specimens and  should not be used as a sole basis for treatment. Nasal washings and  aspirates are unacceptable for Xpert Xpress SARS-CoV-2/FLU/RSV  testing.  Fact Sheet for Patients: PinkCheek.be  Fact Sheet for Healthcare  Providers: GravelBags.it  This test is not yet approved or cleared by the [place] FDA and  has been authorized for detection and/or diagnosis of SARS-CoV-2 by  FDA under an Emergency Use Authorization (EUA). This EUA will remain  in effect (meaning this test can be used) for the duration of the  Covid-19 declaration under Section 564(b)(1) of the Act, 21  U.S.C. section 360bbb-3(b)(1), unless the authorization  is  terminated or revoked. Performed at [facility], [address] [place], [place] [address]   Culture, Urine     Status: Abnormal   Collection Time: 08/17/20  2:27 AM   Specimen: Urine, Random  Result Value Ref Range Status   Specimen Description URINE, RANDOM  Final   Special Requests   Final    NONE Performed at [facility], [place] [address], [place], [place] [address]    Culture MULTIPLE SPECIES PRESENT, SUGGEST RECOLLECTION (A)  Final   Report Status 08/18/2020 FINAL  Final  MRSA PCR Screening     Status: None   Collection Time: 08/17/20  4:36 AM   Specimen: Nasal Mucosa; Nasopharyngeal  Result Value Ref Range Status   MRSA by PCR NEGATIVE NEGATIVE Final    Comment:        The GeneXpert MRSA Assay (FDA approved for NASAL specimens only), is one component of a comprehensive MRSA colonization surveillance program. It is not intended to diagnose MRSA infection nor to guide or monitor treatment for MRSA infections. Performed at [facility], [place] [address] [place], [place] [address]   Culture, blood (routine x 2)     Status: None   Collection Time: 08/17/20  6:14 AM   Specimen: BLOOD RIGHT HAND  Result Value Ref Range Status   Specimen Description BLOOD RIGHT HAND  Final   Special Requests   Final    BOTTLES DRAWN AEROBIC ONLY Blood Culture results may not be optimal due to an inadequate volume of blood received in culture bottles   Culture   Final    NO GROWTH 5 DAYS Performed at [place], [place] [address], [place], [place] [address]    Report Status 08/22/2020 FINAL  Final  Culture, blood (routine x 2)     Status: None   Collection Time: 08/17/20  6:25 AM   Specimen: BLOOD RIGHT HAND  Result Value Ref Range Status   Specimen Description BLOOD RIGHT HAND  Final   Special Requests   Final    BOTTLES DRAWN AEROBIC ONLY Blood Culture results may not be optimal due to an inadequate volume of blood received in culture bottles   Culture   Final    NO GROWTH 5 DAYS Performed at [facility], [place] [address], [place], [place] [address]    Report Status 08/22/2020 FINAL  Final  SARS Coronavirus 2 by RT PCR (hospital order, performed in [facility] hospital lab) Nasopharyngeal Nasopharyngeal Swab     Status: None   Collection Time: 08/22/20 11:25 AM   Specimen: Nasopharyngeal Swab  Result Value Ref Range Status   SARS Coronavirus 2 NEGATIVE NEGATIVE Final    Comment: (NOTE) SARS-CoV-2 target nucleic acids are NOT DETECTED.  The SARS-CoV-2 RNA is generally detectable in upper and lower respiratory specimens during the acute phase of infection. The lowest concentration of SARS-CoV-2 viral copies this assay can detect is 250 copies / mL. A negative result does not preclude SARS-CoV-2 infection and should not be used as the sole basis for treatment or other patient management decisions.  A negative result may occur with improper specimen collection / handling, submission of specimen other than nasopharyngeal swab, presence of viral mutation(s) within the areas targeted by this assay, and inadequate number of viral copies (<250 copies / mL). A negative result must be combined with clinical observations, patient history, and epidemiological information.  Fact Sheet for Patients:   StrictlyIdeas.no  Fact Sheet for Healthcare Providers: BankingDealers.co.za  This test is not yet approved or  cleared by the [place] FDA  and has been authorized for detection and/or diagnosis of SARS-CoV-2 by FDA under an Emergency Use Authorization (EUA).  This EUA will remain in effect (meaning this test can be used) for the duration of the COVID-19 declaration under Section 564(b)(1) of the Act, 21 U.S.C. section 360bbb-3(b)(1), unless the authorization is terminated or revoked sooner.  Performed at [facility], [place] [address] [place], [place] [address]          Radiology Studies: No results found.    Scheduled Meds: . allopurinol  200 mg Oral QPM  . atorvastatin  20 mg Oral Daily  . digoxin  0.125 mg Oral Daily  . feeding supplement (ENSURE ENLIVE)  237 mL Oral BID BM  . mesalamine  1,500 mg Oral Daily  . metoprolol tartrate  100 mg Oral BID  . multivitamin with minerals  1 tablet Oral Daily  . pantoprazole  40 mg Oral Daily  . predniSONE  20 mg Oral Daily  . rivaroxaban  15 mg Oral Daily  . senna-docusate  2 tablet Oral BID   Continuous Infusions:    LOS: 5 days      Debbe Odea, MD [facility] Pager: [email or web address] 08/22/2020, 4:22 PM

## 2020-08-22 NOTE — Progress Notes (Signed)
Progress Note  Patient Name: Michele Green Date of Encounter: 08/22/2020  [facility] Cardiologist: Sinclair Grooms, MD   Subjective   Denies any dyspnea or chest pain  Inpatient Medications    Scheduled Meds: . allopurinol  200 mg Oral QPM  . atorvastatin  20 mg Oral Daily  . digoxin  0.125 mg Oral Daily  . feeding supplement (ENSURE ENLIVE)  237 mL Oral BID BM  . mesalamine  1,500 mg Oral Daily  . metoprolol tartrate  100 mg Oral BID  . multivitamin with minerals  1 tablet Oral Daily  . pantoprazole  40 mg Oral Daily  . predniSONE  20 mg Oral Daily  . rivaroxaban  15 mg Oral Daily  . senna-docusate  2 tablet Oral BID   Continuous Infusions:  PRN Meds: acetaminophen, ALPRAZolam, HYDROmorphone (DILAUDID) injection, iohexol, ondansetron (ZOFRAN) IV, oxyCODONE   Vital Signs    Vitals:   08/21/20 2255 08/21/20 2319 08/22/20 0512 08/22/20 0824  BP:  105/75 116/78 114/80  Pulse: (!) 122 (!) 120 (!) 121 (!) 122  Resp:  20 18 18   Temp:  97.7 F (36.5 C) 97.6 F (36.4 C)   TempSrc:  Oral Oral   SpO2:  98% 98% 99%  Weight:   56.2 kg   Height:        Intake/Output Summary (Last 24 hours) at 08/22/2020 0857 Last data filed at 08/22/2020 0700 Gross per 24 hour  Intake 274.63 ml  Output 2050 ml  Net -1775.37 ml   Last 3 Weights 08/22/2020 08/21/2020 08/20/2020  Weight (lbs) 123 lb 14.4 oz 122 lb 2.2 oz 119 lb 14.9 oz  Weight (kg) 56.201 kg 55.4 kg 54.4 kg      Telemetry    Atrial flutter with rates 80-120s.- Personally Reviewed  ECG    No new EKG- Personally Reviewed  Physical Exam   GEN: No acute distress.   Neck: No JVD Cardiac: regular, tachycardic, no murmurs Respiratory: crackles right base GI: Soft, nontender MS: No edema Neuro:  Nonfocal  Psych: Normal affect   Labs    High Sensitivity Troponin:   Recent Labs  Lab 08/16/20 1452 08/16/20 2352 08/17/20 0539 08/17/20 0614  TROPONINIHS 24* 27* 37* 33*      Chemistry Recent Labs    Lab 08/16/20 1452 08/17/20 0614 08/18/20 0459 08/19/20 0454 08/20/20 1004  NA 138   < > 138 139 137  K 4.2   < > 3.9 3.7 3.7  CL 103   < > 108 105 103  CO2 22   < > 23 25 25   GLUCOSE 111*   < > 84 81 95  BUN 20   < > 14 10 10   CREATININE 0.82   < > 0.86 0.73 0.80  CALCIUM 8.9   < > 8.1* 8.2* 8.0*  PROT 7.1  --   --   --   --   ALBUMIN 3.7  --   --   --   --   AST 34  --   --   --   --   ALT 37  --   --   --   --   ALKPHOS 44  --   --   --   --   BILITOT 0.7  --   --   --   --   GFRNONAA >60   < > >60 >60 >60  GFRAA >60   < > >60 >60 >60  ANIONGAP 13   < >  7 9 9    < > = values in this interval not displayed.     Hematology Recent Labs  Lab 08/20/20 1004 08/21/20 0839 08/22/20 0615  WBC 7.2 3.7* 5.3  RBC 3.75* 3.26* 3.57*  HGB 12.2 10.7* 12.1  HCT 39.4 33.7* 36.7  MCV 105.1* 103.4* 102.8*  MCH 32.5 32.8 33.9  MCHC 31.0 31.8 33.0  RDW 15.0 14.5 14.5  PLT 120* 97* 103*    BNP Recent Labs  Lab 08/16/20 1452  BNP 482.0*     DDimer No results for input(s): DDIMER in the last 168 hours.   Radiology    No results found.  Cardiac Studies   Echo 09/05/19: 1. Left ventricular ejection fraction, by visual estimation, is 60 to  65%. The left ventricle has normal function. There is moderately increased  left ventricular hypertrophy.  2. Global right ventricle has moderately reduced systolic function.The  right ventricular size is normal. No increase in right ventricular wall  thickness.  3. Left atrial size was severely dilated.  4. Right atrial size was normal.  5. The mitral valve has been repaired/replaced. Mild mitral valve  regurgitation. Mild-moderate mitral stenosis.  6. MV peak gradient, 19.4 mmHg. HR 117 during this study.  7. The tricuspid valve is grossly normal. Tricuspid valve regurgitation  is mild.  8. The aortic valve is tricuspid Aortic valve regurgitation was not  visualized by color flow Doppler.  9. The pulmonic valve was  grossly normal. Pulmonic valve regurgitation is  trivial by color flow Doppler.  10. Moderately elevated pulmonary artery systolic pressure.  11. The inferior vena cava is dilated in size with <50% respiratory  variability, suggesting right atrial pressure of 15 mmHg.   Patient Profile     79 y.o. female with a hx of CAD s/p CABG with LIMA-LAD and severe MR s/p repair September 2019, breast cancer, HTN, afib prior to open heart surgery, Maze procedure without atrial appendage ligation September 2019, recurrent Afib/flutter post surgery despite amiodarone therapy and cardioversion November 2019 with plan to rate control who is being seen today for the evaluation of anticoagulation in the setting of spinal hematoma at the request of Dr. Louanne Belton.  Assessment & Plan    Persistent atypical Afib/flutter: She has failed multiple therapies including Maze procedure, amio and cardioversion. Not a ablation candidate. LA severely dilated.  Currently in AFL with RVR - CHADSVASC of 5.  On Xarelto >>held for spinal hematoma.  OK to restart AC per neurosurgery, started heparin gtt, transition today to Xarelto - Metoprolol 100 mg BID - Given soft BP, loaded with digoxin 9/29 and will continue digoxin 0.125 mg daily, check digoxin level in 1 week -Rates appear improved, no longer having RVR to 150s and overnight was down to 80s.  Currently in 120s.  Continue metoprolol and digoxin.  Hypotension: SBP down to 70s 9/29, asymptomatic.  Suspect hypovolemic as net negative 1.2L prior day on PO lasix 20, likely due to being NPO for procedure.  Given 250 cc bolus 9/29.  Appears resolved.  T7 compression fracture with mild paraspinal hematoma: anticoagulation held initiallly.  S/p vertebroplasty 9/29 - Heparin gtt restarted, transitioning to Xarelto today  Elevated troponin with known CAD s/p CABG: HS troponin 37>33.  Denies chest pain - likely demand ischemia in the setting of aflutter RVR - continue statin and  BB  Chronic diastolic CHF: BNP 448, imaging with mild pulmonary edema.  Home lasix was restarted, held 9/29 due to hypotension - Holding lasix given  hypotension as above.  Hypotension resolved, will continue to monitor, will likely need intermittent PO lasix   For questions or updates, please contact [place] Please consult [email or web address] for contact info under        Signed, Donato Heinz, MD  08/22/2020, 8:57 AM

## 2020-08-22 NOTE — Progress Notes (Signed)
   08/22/20 2052  Assess: MEWS Score  Temp 97.6 F (36.4 C)  BP 107/79  Pulse Rate (!) 123  Resp 17  SpO2 100 %  O2 Device Nasal Cannula  Assess: MEWS Score  MEWS Temp 0  MEWS Systolic 0  MEWS Pulse 2  MEWS RR 0  MEWS LOC 0  MEWS Score 2  MEWS Score Color Yellow  Assess: if the MEWS score is Yellow or Red  Were vital signs taken at a resting state? Yes  Focused Assessment No change from prior assessment  Early Detection of Sepsis Score *See Row Information* Low  MEWS guidelines implemented *See Row Information* No, previously yellow, continue vital signs every 4 hours  Treat  MEWS Interventions Administered scheduled meds/treatments  Take Vital Signs  Increase Vital Sign Frequency   (Chronic)  Escalate  MEWS: Escalate Yellow: discuss with charge nurse/RN and consider discussing with provider and RRT  Notify: Charge Nurse/RN  Name of Charge Nurse/RN Notified Danae Chen RN  Date Charge Nurse/RN Notified 08/22/20  Time Charge Nurse/RN Notified 2000  Document  Patient Outcome  (HR trending Down)

## 2020-08-23 DIAGNOSIS — Z7189 Other specified counseling: Secondary | ICD-10-CM

## 2020-08-23 MED ORDER — DIGOXIN 125 MCG PO TABS: 0.1250 mg | ORAL_TABLET | Freq: Every day | ORAL | Status: DC

## 2020-08-23 MED ORDER — VITAMIN D (ERGOCALCIFEROL) 1.25 MG (50000 UNIT) PO CAPS: 50000.0000 [IU] | ORAL_CAPSULE | ORAL | Status: DC

## 2020-08-23 MED ORDER — CALCIUM GLUCONATE 500 MG PO TABS: 1.0000 | ORAL_TABLET | Freq: Three times a day (TID) | ORAL | Status: DC

## 2020-08-23 MED ORDER — ADULT MULTIVITAMIN W/MINERALS CH: 1.0000 | ORAL_TABLET | Freq: Every day | ORAL | Status: DC

## 2020-08-23 MED ORDER — SENNOSIDES-DOCUSATE SODIUM 8.6-50 MG PO TABS: 2.0000 | ORAL_TABLET | Freq: Two times a day (BID) | ORAL | Status: DC

## 2020-08-23 MED ORDER — POTASSIUM CHLORIDE CRYS ER 20 MEQ PO TBCR: 20.0000 meq | EXTENDED_RELEASE_TABLET | Freq: Every day | ORAL | 3 refills | Status: DC | PRN

## 2020-08-23 MED ORDER — ENSURE ENLIVE PO LIQD: 237.0000 mL | Freq: Two times a day (BID) | ORAL | 12 refills | Status: DC

## 2020-08-23 NOTE — Discharge Summary (Signed)
Physician Discharge Summary  Michele Green BJS:[id number] DOB: 1941/06/04 DOA: 08/16/2020  PCP: [name], MD  Admit date: 08/16/2020 Discharge date: 08/23/2020  Admitted From: home Disposition:  SNF  Recommendations for Outpatient Follow-up:  1. F/u daily weights and resume Lasix as needed   Discharge Condition:  stable   CODE STATUS:  DNR   Diet recommendation:  Heart healthy, 1800 cc fluid restriction Consultations:  cardiology  Procedures/Studies: . 2 D ECHO   Discharge Diagnoses:  Principal Problem:   Spinal fracture of T7 vertebra ([place]) Active Problems:   Atrial fibrillation with RVR (HCC)   Acute on Chronic diastolic HF (heart failure) (HCC) Osteoporosis- not treated in the past   Essential hypertension   S/P CABG x 1   S/P Maze operation for atrial fibrillation   Palliative care by specialist   Goals of care, counseling/discussion     Brief Summary: Michele Green is a 79 y.o.femalewithpastmedical history significant forbreast cancer, osteoporosis,persistent A. Fib/ flutter s/p Maze on Xarelto and rate control, essential hypertension,mitral valve repair,coronary artery disease status post CABG, chronic diastolic CHF, ulcer[facility] after rolling over in bed the night prior to her presentation. Deniedany trauma.She wasfound to have an acuteor subacuteT7 compression fracture on MRI. Normal cord signal intensity, no cord lesions or syrinx. Mild paraspinal hematoma at T7.Patient also had atrial fibrillation withRVR,mildpulmonary edema on chest x-ray,and newhypoxiafor which she was transferred to [facility] further evaluation and management. Troponinwasmildly elevated, peaked at 27.Patient was then admitted to hospital for further evaluation and treatment.  Subjective: noted to be sitting up in a chair today. No new complaints.   Hospital Course:  Principal  Problem:   Non traumatic spinal fracture of T7 vertebra in setting of osteoporosis - NS and IR consulted - has undergone a vertebroplasty on 9/28- continues to have "soreness" in the area- pain is   4/10 now. -  PT recommending SNF -Continue pain medications as needed (Oxycodone) - start Calcium and Vit D- Vit D level is 22.13 - PCP can consider starting bisphophonate in the next couple of weeks - of note, she is on Prednisone for UC which is likely adding to her osteoporosis - Dr Marcello Moores from [facility] states that she can follow up with their group for an xray in 2 wks  Active Problems:    Persistent atrial fibrillation with RVR  S/P Maze operation   - initially given Amio bolus and Cardizem IV -Cardiology attempting to manage-  low blood pressures are making it difficult to manage her rate- Cardizem discontinued- continue Metoprolol- - Dig loading per cardiology- now on 0.125 mg daily - on  IV heparin for procedure on 9/28- switched to Xarelto 10/1 -She is in A-fib and HR today is in 70s at rest    Acute on Chronic diastolic HF (heart failure) with acute respiratory failure - presented with acute hypoxia due to pulmonary edema- given IV Lasix followed by oral Lasix - Lasix then held as she was hypotensive and remains on hold - pulse ox is 99% on room air - can use Lasix PRN for weight gain - please check daily weights at facility  CAD  S/P CABG x 1 - cont Lipitor- Xarelto being used in place of Aspirin  Ulcerative colitis Continue prednisone and mesalamine- patient states Dr Therisa Doyne increased her Prednisone to 20 mg and plans to f/u in Nov to see if it should be reduced - no abdominal pain or diarrhea  noted in the hospital  Breast cancer -Her oncologist Dr. Burr Medico has contacted me and states that we should hold tamoxifen  for 1 to 2 months to prevent DVT  Chronic thrombocytopenia  Disposition: Her son feels she would benefit from ALF after SNF- he states that the patient has  declined this in the past and prefers to live alone> I have updated her son in regards to her medical issues.    Discharge Exam: Vitals:   08/23/20 0047 08/23/20 0629  BP: (!) 134/91 121/68  Pulse: (!) 40 62  Resp: 18 17  Temp:  97.8 F (36.6 C)  SpO2: 97% 98%   Vitals:   08/22/20 2052 08/23/20 0000 08/23/20 0047 08/23/20 0629  BP: 107/79  (!) 134/91 121/68  Pulse: (!) 123  (!) 40 62  Resp: 17  18 17   Temp: 97.6 F (36.4 C) 97.8 F (36.6 C)  97.8 F (36.6 C)  TempSrc: Oral   Oral  SpO2: 100%  97% 98%  Weight:    54 kg  Height:        General: Pt is alert, awake, not in acute distress Cardiovascular: IIRR, S1/S2 +, no rubs, no gallops Respiratory: CTA bilaterally, no wheezing, no rhonchi Abdominal: Soft, NT, ND, bowel sounds + Extremities: no edema, no cyanosis   Discharge Instructions  Discharge Instructions    Diet - low sodium heart healthy   Complete by: As directed    Increase activity slowly   Complete by: As directed    No wound care   Complete by: As directed      Allergies as of 08/23/2020      Reactions   Shellfish Allergy Itching      Medication List    STOP taking these medications   diltiazem 180 MG 24 hr capsule Commonly known as: CARDIZEM CD   phenazopyridine 200 MG tablet Commonly known as: Pyridium   tamoxifen 20 MG tablet Commonly known as: NOLVADEX   traMADol 50 MG tablet Commonly known as: Ultram     TAKE these medications   acetaminophen 500 MG tablet Commonly known as: TYLENOL Take 500-1,000 mg by mouth every 6 (six) hours as needed for moderate pain or headache. What changed: Another medication with the same name was removed. Continue taking this medication, and follow the directions you see here.   allopurinol 100 MG tablet Commonly known as: ZYLOPRIM Take 200 mg by mouth every evening.   atorvastatin 20 MG tablet Commonly known as: LIPITOR TAKE 1 TABLET BY MOUTH EVERY DAY   digoxin 0.125 MG tablet Commonly  known as: LANOXIN Take 1 tablet (0.125 mg total) by mouth daily. Start taking on: August 24, 2020   feeding supplement (ENSURE ENLIVE) Liqd Take 237 mLs by mouth 2 (two) times daily between meals.   furosemide 20 MG tablet Commonly known as: LASIX Take 20 mg by mouth daily as needed (for swelling/fluid retention.).   mesalamine 0.375 g 24 hr capsule Commonly known as: APRISO Take 1.5 g by mouth daily.   metoprolol tartrate 100 MG tablet Commonly known as: LOPRESSOR TAKE 1 TABLET BY MOUTH TWICE A DAY   multivitamin with minerals Tabs tablet Take 1 tablet by mouth daily. Start taking on: August 24, 2020   nitrofurantoin 50 MG capsule Commonly known as: MACRODANTIN Take 50 mg by mouth at bedtime.   oxyCODONE 5 MG immediate release tablet Commonly known as: Oxy IR/ROXICODONE Take 1 tablet (5 mg total) by mouth every 4 (four) hours as needed for  moderate pain (Use for breakthrough moderate pain).   potassium chloride SA 20 MEQ tablet Commonly known as: Klor-Con M20 Take 1 tablet (20 mEq total) by mouth daily as needed. Take only when taking a Lasix What changed:   how much to take  when to take this  reasons to take this  additional instructions   predniSONE 10 MG tablet Commonly known as: DELTASONE Take 20 mg by mouth daily.   senna-docusate 8.6-50 MG tablet Commonly known as: Senokot-S Take 2 tablets by mouth 2 (two) times daily.   Xarelto 15 MG Tabs tablet Generic drug: Rivaroxaban TAKE 1 TABLET BY MOUTH  DAILY WITH SUPPER What changed: See the new instructions.       Contact information for follow-up providers    Nicholes Stairs, MD. Schedule an appointment as soon as possible for a visit in 2 days.   Specialty: Orthopedic Surgery Why: As needed Contact information: [address] [place] [address] [phone]        [name], MD Follow up.   Specialty: Cardiology Why: We will arrange for follow-up with Dr. Thompson Caul  team within the next 2 wks, and contact you. Contact information: [address] [place] [address] [phone]            Contact information for after-discharge care    Destination    [facility]erred [facility] .   Service: Skilled Nursing Contact information: [place] [address] [phone]                 Allergies  Allergen Reactions  . Shellfish Allergy Itching      DG Chest 2 View  Result Date: 08/16/2020 CLINICAL DATA:  Hypoxia.  Mid back pain. EXAM: CHEST - 2 VIEW COMPARISON:  Radiograph 07/22/2020 FINDINGS: Lower lung volumes from prior exam. Post median sternotomy. CABG with left atrial clipping. Prosthetic cardiac valve. Similar cardiomegaly to prior exam. Unchanged mediastinal contours. Mild increase in left basilar atelectasis from prior exam. Bronchovascular crowding versus vascular congestion. No pneumothorax or pleural effusion. Midthoracic vertebral compression which was assessed on radiograph and MRI earlier today. IMPRESSION: 1. Lower lung volumes from prior exam with bronchovascular crowding versus vascular congestion. 2. Mild increase in left basilar atelectasis. Electronically Signed   By: Keith Rake M.D.   On: 08/16/2020 21:20   DG Thoracic Spine W/Swimmers  Result Date: 08/16/2020 CLINICAL DATA:  79 year old female presenting with upper back pain. EXAM: THORACIC SPINE - 3 VIEWS COMPARISON:  Chest radiograph from May 31 21, CT abdomen pelvis from 05/22/2020, and chest radiograph from 05/17/2020 FINDINGS: Interval anterior wedge compression deformity of the T7 vertebral body with associated focal kyphosis at this level. There is approximately 75% anterior vertebral body height loss. Unchanged chronic compression fractures of the L1-L2 vertebral bodies. Diffuse osteopenia. Similar appearing thoracic and abdominal aortic atherosclerotic calcifications. Median sternotomy wires in place. Left atrial  appendage clip and mitral prosthesis in place. IMPRESSION: Acute compression fracture of the T7 vertebral body with approximately 75% anterior vertebral body height loss. Consider thoracic spine MRI for further characterization. Diffuse osteopenia. Aortic Atherosclerosis (ICD10-I70.0). These results were called by telephone at the time of interpretation on 08/16/2020 at 2:28 pm to provider [facility] , who verbally acknowledged these results. Electronically Signed   By: Ruthann Cancer MD   On: 08/16/2020 14:29   MR THORACIC SPINE WO CONTRAST  Result Date: 08/16/2020 CLINICAL DATA:  Mid back pain for 3 days.  EXAM: MRI THORACIC SPINE WITHOUT CONTRAST TECHNIQUE: Multiplanar, multisequence MR imaging of the thoracic spine was performed. No intravenous contrast was administered. COMPARISON:  Thoracic spine films, same date. FINDINGS: Alignment: Normal overall alignment of the thoracic vertebral bodies. Slightly exaggerated thoracic kyphosis due to the significant T7 compression fracture. Vertebrae: T7 compression fracture appears acute or subacute. The vertebral body is significantly compressed anteriorly approximately 75%. There is mild retropulsion involving the posteroinferior aspect of the vertebral body but no significant canal stenosis. Remote compression fractures noted in the lumbar spine. The other thoracic vertebral bodies are maintained. Cord:  Normal cord signal intensity.  No cord lesions or syrinx. Paraspinal and other soft tissues: Mild paraspinal hematoma at T7. There also small pleural effusions and bibasilar atelectasis. Disc levels: No significant thoracic disc protrusions, spinal or foraminal stenosis. Mild retropulsion at T7 with mild canal narrowing at T7-8 but no significant canal stenosis or cord compression. IMPRESSION: 1. Acute or subacute T7 compression fracture with mild retropulsion involving the posteroinferior aspect of the vertebral body but no significant canal stenosis. 2. Remote  compression fractures of the lumbar spine. 3. No significant thoracic disc protrusions, spinal or foraminal stenosis. Electronically Signed   By: Marijo Sanes M.D.   On: 08/16/2020 18:02   IR VERTEBROPLASTY CERV/THOR BX INC UNI/BIL INC/INJECT/IMAGING  Result Date: 08/20/2020 INDICATION: Severe midthoracic pain secondary to compression fracture at T7. EXAM: VERTEBROPLASTY AT T7 MEDICATIONS: As antibiotic prophylaxis, Ancef 2 g IV was ordered pre-procedure and administered intravenously within 1 hour of incision. ANESTHESIA/SEDATION: Moderate (conscious) sedation was employed during this procedure. A total of Versed 1 mg and Fentanyl 25 mcg was administered intravenously. Moderate Sedation Time: 28 minutes. The patient's level of consciousness and vital signs were monitored continuously by radiology nursing throughout the procedure under my direct supervision. FLUOROSCOPY TIME:  Fluoroscopy Time: 9 minutes 12 seconds (670 mGy) COMPLICATIONS: None immediate. TECHNIQUE: Informed written consent was obtained from the patient after a thorough discussion of the procedural risks, benefits and alternatives. All questions were addressed. Maximal Sterile Barrier Technique was utilized including caps, mask, sterile gowns, sterile gloves, sterile drape, hand hygiene and skin antiseptic. A timeout was performed prior to the initiation of the procedure. PROCEDURE: The patient was placed prone on the fluoroscopic table. Nasal oxygen was administered. Physiologic monitoring was performed throughout the duration of the procedure. The skin overlying the thoracic region was prepped and draped in the usual sterile fashion. The T7 vertebral body was identified and the right pedicle was infiltrated with 0.25% Bupivacaine. This was then followed by the advancement of a 13-gauge Cook needle through the right pedicle into the anterior one-third at T7. A gentle contrast injection demonstrated a trabecular pattern of contrast with  simultaneous opacification of the paraspinous veins. Gelfoam pledgets were then infused through the 13 gauge Cook spinal needle prior to the delivery of the methylmethacrylate mixture. At this time, methylmethacrylate mixture was reconstituted. Under biplane intermittent fluoroscopy, the methylmethacrylate was then injected into the T7 vertebral body with filling of the vertebral body. No extravasation was noted into the disk spaces or posteriorly into the spinal canal. No epidural venous contamination was seen. The needle was then removed. Hemostasis was achieved at the skin entry site. There were no acute complications. Patient tolerated the procedure well. The patient was then returned to the floor in stable and good condition. IMPRESSION: 1. Status post vertebral body augmentation for painful compression fracture at T7 using vertebroplasty technique. Electronically Signed   By: Corky Downs.D.  On: 08/19/2020 16:21     The results of significant diagnostics from this hospitalization (including imaging, microbiology, ancillary and laboratory) are listed below for reference.     Microbiology: Recent Results (from the past 240 hour(s))  Respiratory Panel by RT PCR (Flu A&B, Covid) - Nasopharyngeal Swab     Status: None   Collection Time: 08/16/20  7:24 PM   Specimen: Nasopharyngeal Swab  Result Value Ref Range Status   SARS Coronavirus 2 by RT PCR NEGATIVE NEGATIVE Final    Comment: (NOTE) SARS-CoV-2 target nucleic acids are NOT DETECTED.  The SARS-CoV-2 RNA is generally detectable in upper respiratoy specimens during the acute phase of infection. The lowest concentration of SARS-CoV-2 viral copies this assay can detect is 131 copies/mL. A negative result does not preclude SARS-Cov-2 infection and should not be used as the sole basis for treatment or other patient management decisions. A negative result may occur with  improper specimen collection/handling, submission of specimen  other than nasopharyngeal swab, presence of viral mutation(s) within the areas targeted by this assay, and inadequate number of viral copies (<131 copies/mL). A negative result must be combined with clinical observations, patient history, and epidemiological information. The expected result is Negative.  Fact Sheet for Patients:  PinkCheek.be  Fact Sheet for Healthcare Providers:  GravelBags.it  This test is no t yet approved or cleared by the [place] FDA and  has been authorized for detection and/or diagnosis of SARS-CoV-2 by FDA under an Emergency Use Authorization (EUA). This EUA will remain  in effect (meaning this test can be used) for the duration of the COVID-19 declaration under Section 564(b)(1) of the Act, 21 U.S.C. section 360bbb-3(b)(1), unless the authorization is terminated or revoked sooner.     Influenza A by PCR NEGATIVE NEGATIVE Final   Influenza B by PCR NEGATIVE NEGATIVE Final    Comment: (NOTE) The Xpert Xpress SARS-CoV-2/FLU/RSV assay is intended as an aid in  the diagnosis of influenza from Nasopharyngeal swab specimens and  should not be used as a sole basis for treatment. Nasal washings and  aspirates are unacceptable for Xpert Xpress SARS-CoV-2/FLU/RSV  testing.  Fact Sheet for Patients: PinkCheek.be  Fact Sheet for Healthcare Providers: GravelBags.it  This test is not yet approved or cleared by the [place] FDA and  has been authorized for detection and/or diagnosis of SARS-CoV-2 by  FDA under an Emergency Use Authorization (EUA). This EUA will remain  in effect (meaning this test can be used) for the duration of the  Covid-19 declaration under Section 564(b)(1) of the Act, 21  U.S.C. section 360bbb-3(b)(1), unless the authorization is  terminated or revoked. Performed at [facility], [address]  [place], [place] [address]   Culture, Urine     Status: Abnormal   Collection Time: 08/17/20  2:27 AM   Specimen: Urine, Random  Result Value Ref Range Status   Specimen Description URINE, RANDOM  Final   Special Requests   Final    NONE Performed at [facility], [place] [address] [place], [place] [address]    Culture MULTIPLE SPECIES PRESENT, SUGGEST RECOLLECTION (A)  Final   Report Status 08/18/2020 FINAL  Final  MRSA PCR Screening     Status: None   Collection Time: 08/17/20  4:36 AM   Specimen: Nasal Mucosa; Nasopharyngeal  Result Value Ref Range Status   MRSA by PCR NEGATIVE NEGATIVE Final    Comment:        The GeneXpert  MRSA Assay (FDA approved for NASAL specimens only), is one component of a comprehensive MRSA colonization surveillance program. It is not intended to diagnose MRSA infection nor to guide or monitor treatment for MRSA infections. Performed at [facility], [place] [address], [place], [place] [address]   Culture, blood (routine x 2)     Status: None   Collection Time: 08/17/20  6:14 AM   Specimen: BLOOD RIGHT HAND  Result Value Ref Range Status   Specimen Description BLOOD RIGHT HAND  Final   Special Requests   Final    BOTTLES DRAWN AEROBIC ONLY Blood Culture results may not be optimal due to an inadequate volume of blood received in culture bottles   Culture   Final    NO GROWTH 5 DAYS Performed at [facility], [place] [address], [place], [place] [address]    Report Status 08/22/2020 FINAL  Final  Culture, blood (routine x 2)     Status: None   Collection Time: 08/17/20  6:25 AM   Specimen: BLOOD RIGHT HAND  Result Value Ref Range Status   Specimen Description BLOOD RIGHT HAND  Final   Special Requests   Final    BOTTLES DRAWN AEROBIC ONLY Blood Culture results may not be optimal due to an inadequate volume of blood received in culture bottles   Culture   Final    NO GROWTH 5 DAYS Performed at [facility], [place] [address],  [place], [place] [address]    Report Status 08/22/2020 FINAL  Final  SARS Coronavirus 2 by RT PCR (hospital order, performed in [facility] hospital lab) Nasopharyngeal Nasopharyngeal Swab     Status: None   Collection Time: 08/22/20 11:25 AM   Specimen: Nasopharyngeal Swab  Result Value Ref Range Status   SARS Coronavirus 2 NEGATIVE NEGATIVE Final    Comment: (NOTE) SARS-CoV-2 target nucleic acids are NOT DETECTED.  The SARS-CoV-2 RNA is generally detectable in upper and lower respiratory specimens during the acute phase of infection. The lowest concentration of SARS-CoV-2 viral copies this assay can detect is 250 copies / mL. A negative result does not preclude SARS-CoV-2 infection and should not be used as the sole basis for treatment or other patient management decisions.  A negative result may occur with improper specimen collection / handling, submission of specimen other than nasopharyngeal swab, presence of viral mutation(s) within the areas targeted by this assay, and inadequate number of viral copies (<250 copies / mL). A negative result must be combined with clinical observations, patient history, and epidemiological information.  Fact Sheet for Patients:   StrictlyIdeas.no  Fact Sheet for Healthcare Providers: BankingDealers.co.za  This test is not yet approved or  cleared by the [place] FDA and has been authorized for detection and/or diagnosis of SARS-CoV-2 by FDA under an Emergency Use Authorization (EUA).  This EUA will remain in effect (meaning this test can be used) for the duration of the COVID-19 declaration under Section 564(b)(1) of the Act, 21 U.S.C. section 360bbb-3(b)(1), unless the authorization is terminated or revoked sooner.  Performed at [facility], [place] [address], [place], [place] [address]      Labs: BNP (last 3 results) Recent Labs    05/17/20 1230 08/16/20 1452  BNP 237.4* 482.0*    Basic Metabolic Panel: Recent Labs  Lab 08/16/20 1452 08/17/20 0614 08/18/20 0459 08/19/20 0454 08/20/20 1004  NA 138 141 138 139 137  K 4.2 3.9 3.9 3.7 3.7  CL  103 105 108 105 103  CO2 22 24 23 25 25   GLUCOSE 111* 83 84 81 95  BUN 20 16 14 10 10   CREATININE 0.82 0.81 0.86 0.73 0.80  CALCIUM 8.9 8.4* 8.1* 8.2* 8.0*  MG  --   --   --  1.8 1.7   Liver Function Tests: Recent Labs  Lab 08/16/20 1452  AST 34  ALT 37  ALKPHOS 44  BILITOT 0.7  PROT 7.1  ALBUMIN 3.7   No results for input(s): LIPASE, AMYLASE in the last 168 hours. No results for input(s): AMMONIA in the last 168 hours. CBC: Recent Labs  Lab 08/16/20 1452 08/16/20 1452 08/17/20 0614 08/17/20 0614 08/18/20 0459 08/19/20 0454 08/20/20 1004 08/21/20 0839 08/22/20 0615  WBC 16.4*   < > 10.7*   < > 6.8 6.2 7.2 3.7* 5.3  NEUTROABS 13.6*  --  8.0*  --  4.8 4.1  --   --   --   HGB 13.7   < > 12.8   < > 12.3 12.1 12.2 10.7* 12.1  HCT 43.6   < > 39.9   < > 38.4 38.3 39.4 33.7* 36.7  MCV 105.1*   < > 103.1*   < > 105.2* 105.8* 105.1* 103.4* 102.8*  PLT 157   < > 128*   < > 103* 103* 120* 97* 103*   < > = values in this interval not displayed.   Cardiac Enzymes: No results for input(s): CKTOTAL, CKMB, CKMBINDEX, TROPONINI in the last 168 hours. BNP: Invalid input(s): POCBNP CBG: Recent Labs  Lab 08/17/20 0100 08/17/20 1710  GLUCAP 92 91   D-Dimer No results for input(s): DDIMER in the last 72 hours. Hgb A1c No results for input(s): HGBA1C in the last 72 hours. Lipid Profile No results for input(s): CHOL, HDL, LDLCALC, TRIG, CHOLHDL, LDLDIRECT in the last 72 hours. Thyroid function studies No results for input(s): TSH, T4TOTAL, T3FREE, THYROIDAB in the last 72 hours.  Invalid input(s): FREET3 Anemia work up No results for input(s): VITAMINB12, FOLATE, FERRITIN, TIBC, IRON, RETICCTPCT in the last 72 hours. Urinalysis    Component Value Date/Time   COLORURINE YELLOW 08/17/2020 [place] 08/17/2020 0434   LABSPEC 1.011 08/17/2020 0434   PHURINE 5.0 08/17/2020 0434   GLUCOSEU NEGATIVE 08/17/2020 0434   HGBUR NEGATIVE 08/17/2020 0434   BILIRUBINUR NEGATIVE 08/17/2020 [place] 08/17/2020 0434   PROTEINUR NEGATIVE 08/17/2020 0434   NITRITE NEGATIVE 08/17/2020 0434   LEUKOCYTESUR NEGATIVE 08/17/2020 0434   Sepsis Labs Invalid input(s): PROCALCITONIN,  WBC,  LACTICIDVEN Microbiology Recent Results (from the past 240 hour(s))  Respiratory Panel by RT PCR (Flu A&B, Covid) - Nasopharyngeal Swab     Status: None   Collection Time: 08/16/20  7:24 PM   Specimen: Nasopharyngeal Swab  Result Value Ref Range Status   SARS Coronavirus 2 by RT PCR NEGATIVE NEGATIVE Final    Comment: (NOTE) SARS-CoV-2 target nucleic acids are NOT DETECTED.  The SARS-CoV-2 RNA is generally detectable in upper respiratoy specimens during the acute phase of infection. The lowest concentration of SARS-CoV-2 viral copies this assay can detect is 131 copies/mL. A negative result does not preclude SARS-Cov-2 infection and should not be used as the sole basis for treatment or other patient management decisions. A negative result may occur with  improper specimen collection/handling, submission of specimen other than nasopharyngeal swab, presence of viral mutation(s) within the areas targeted by this assay, and inadequate number of viral  copies (<131 copies/mL). A negative result must be combined with clinical observations, patient history, and epidemiological information. The expected result is Negative.  Fact Sheet for Patients:  PinkCheek.be  Fact Sheet for Healthcare Providers:  GravelBags.it  This test is no t yet approved or cleared by the [place] FDA and  has been authorized for detection and/or diagnosis of SARS-CoV-2 by FDA under an Emergency Use Authorization (EUA). This EUA will remain  in  effect (meaning this test can be used) for the duration of the COVID-19 declaration under Section 564(b)(1) of the Act, 21 U.S.C. section 360bbb-3(b)(1), unless the authorization is terminated or revoked sooner.     Influenza A by PCR NEGATIVE NEGATIVE Final   Influenza B by PCR NEGATIVE NEGATIVE Final    Comment: (NOTE) The Xpert Xpress SARS-CoV-2/FLU/RSV assay is intended as an aid in  the diagnosis of influenza from Nasopharyngeal swab specimens and  should not be used as a sole basis for treatment. Nasal washings and  aspirates are unacceptable for Xpert Xpress SARS-CoV-2/FLU/RSV  testing.  Fact Sheet for Patients: PinkCheek.be  Fact Sheet for Healthcare Providers: GravelBags.it  This test is not yet approved or cleared by the [place] FDA and  has been authorized for detection and/or diagnosis of SARS-CoV-2 by  FDA under an Emergency Use Authorization (EUA). This EUA will remain  in effect (meaning this test can be used) for the duration of the  Covid-19 declaration under Section 564(b)(1) of the Act, 21  U.S.C. section 360bbb-3(b)(1), unless the authorization is  terminated or revoked. Performed at [facility], [address] [place], [place] [address]   Culture, Urine     Status: Abnormal   Collection Time: 08/17/20  2:27 AM   Specimen: Urine, Random  Result Value Ref Range Status   Specimen Description URINE, RANDOM  Final   Special Requests   Final    NONE Performed at [facility], [place] [address], [place], [place] [address]    Culture MULTIPLE SPECIES PRESENT, SUGGEST RECOLLECTION (A)  Final   Report Status 08/18/2020 FINAL  Final  MRSA PCR Screening     Status: None   Collection Time: 08/17/20  4:36 AM   Specimen: Nasal Mucosa; Nasopharyngeal  Result Value Ref Range Status   MRSA by PCR NEGATIVE NEGATIVE Final    Comment:        The GeneXpert MRSA Assay (FDA approved for NASAL  specimens only), is one component of a comprehensive MRSA colonization surveillance program. It is not intended to diagnose MRSA infection nor to guide or monitor treatment for MRSA infections. Performed at [facility], [place] [address], [place], [place] [address]   Culture, blood (routine x 2)     Status: None   Collection Time: 08/17/20  6:14 AM   Specimen: BLOOD RIGHT HAND  Result Value Ref Range Status   Specimen Description BLOOD RIGHT HAND  Final   Special Requests   Final    BOTTLES DRAWN AEROBIC ONLY Blood Culture results may not be optimal due to an inadequate volume of blood received in culture bottles   Culture   Final    NO GROWTH 5 DAYS Performed at [facility], [place] [address], [place], [place] [address]    Report Status 08/22/2020 FINAL  Final  Culture, blood (routine x 2)     Status: None   Collection Time: 08/17/20  6:25 AM   Specimen: BLOOD RIGHT HAND  Result Value  Ref Range Status   Specimen Description BLOOD RIGHT HAND  Final   Special Requests   Final    BOTTLES DRAWN AEROBIC ONLY Blood Culture results may not be optimal due to an inadequate volume of blood received in culture bottles   Culture   Final    NO GROWTH 5 DAYS Performed at [facility], [place] [address] [place], [place] [address]    Report Status 08/22/2020 FINAL  Final  SARS Coronavirus 2 by RT PCR (hospital order, performed in [facility] hospital lab) Nasopharyngeal Nasopharyngeal Swab     Status: None   Collection Time: 08/22/20 11:25 AM   Specimen: Nasopharyngeal Swab  Result Value Ref Range Status   SARS Coronavirus 2 NEGATIVE NEGATIVE Final    Comment: (NOTE) SARS-CoV-2 target nucleic acids are NOT DETECTED.  The SARS-CoV-2 RNA is generally detectable in upper and lower respiratory specimens during the acute phase of infection. The lowest concentration of SARS-CoV-2 viral copies this assay can detect is 250 copies / mL. A negative result does not preclude SARS-CoV-2  infection and should not be used as the sole basis for treatment or other patient management decisions.  A negative result may occur with improper specimen collection / handling, submission of specimen other than nasopharyngeal swab, presence of viral mutation(s) within the areas targeted by this assay, and inadequate number of viral copies (<250 copies / mL). A negative result must be combined with clinical observations, patient history, and epidemiological information.  Fact Sheet for Patients:   StrictlyIdeas.no  Fact Sheet for Healthcare Providers: BankingDealers.co.za  This test is not yet approved or  cleared by the [place] FDA and has been authorized for detection and/or diagnosis of SARS-CoV-2 by FDA under an Emergency Use Authorization (EUA).  This EUA will remain in effect (meaning this test can be used) for the duration of the COVID-19 declaration under Section 564(b)(1) of the Act, 21 U.S.C. section 360bbb-3(b)(1), unless the authorization is terminated or revoked sooner.  Performed at [facility], [place] [address] [place], [place] [address]      Time coordinating discharge in minutes: 65  SIGNED:   Debbe Odea, MD  [facility] 08/23/2020, 9:26 AM

## 2020-08-23 NOTE — Progress Notes (Signed)
Progress Note  Patient Name: ICEIS KNAB Date of Encounter: 08/23/2020  [facility] Cardiologist: Sinclair Grooms, MD   Subjective   No CP, dyspnea or palpitations; complains of back pain  Inpatient Medications    Scheduled Meds: . allopurinol  200 mg Oral QPM  . atorvastatin  20 mg Oral Daily  . digoxin  0.125 mg Oral Daily  . feeding supplement (ENSURE ENLIVE)  237 mL Oral BID BM  . mesalamine  1,500 mg Oral Daily  . metoprolol tartrate  100 mg Oral BID  . multivitamin with minerals  1 tablet Oral Daily  . pantoprazole  40 mg Oral Daily  . predniSONE  20 mg Oral Daily  . rivaroxaban  15 mg Oral Daily  . senna-docusate  2 tablet Oral BID   Continuous Infusions:  PRN Meds: acetaminophen, ALPRAZolam, HYDROmorphone (DILAUDID) injection, iohexol, ondansetron (ZOFRAN) IV, oxyCODONE   Vital Signs    Vitals:   08/22/20 2052 08/23/20 0000 08/23/20 0047 08/23/20 0629  BP: 107/79  (!) 134/91 121/68  Pulse: (!) 123  (!) 40 62  Resp: 17  18 17   Temp: 97.6 F (36.4 C) 97.8 F (36.6 C)  97.8 F (36.6 C)  TempSrc: Oral   Oral  SpO2: 100%  97% 98%  Weight:    54 kg  Height:        Intake/Output Summary (Last 24 hours) at 08/23/2020 0639 Last data filed at 08/23/2020 6301 Gross per 24 hour  Intake 715 ml  Output 1701 ml  Net -986 ml   Last 3 Weights 08/23/2020 08/22/2020 08/21/2020  Weight (lbs) 119 lb 123 lb 14.4 oz 122 lb 2.2 oz  Weight (kg) 53.978 kg 56.201 kg 55.4 kg      Telemetry    Atrial flutter with mildly elevated rate (110-120) - Personally Reviewed  Physical Exam   GEN: No acute distress.   Neck: No JVD Cardiac: irregular Respiratory: Clear to auscultation bilaterally. GI: Soft, nontender, non-distended  MS: No edema Neuro:  Nonfocal  Psych: Normal affect   Labs    High Sensitivity Troponin:   Recent Labs  Lab 08/16/20 1452 08/16/20 2352 08/17/20 0539 08/17/20 0614  TROPONINIHS 24* 27* 37* 33*      Chemistry Recent Labs  Lab  08/16/20 1452 08/17/20 0614 08/18/20 0459 08/19/20 0454 08/20/20 1004  NA 138   < > 138 139 137  K 4.2   < > 3.9 3.7 3.7  CL 103   < > 108 105 103  CO2 22   < > 23 25 25   GLUCOSE 111*   < > 84 81 95  BUN 20   < > 14 10 10   CREATININE 0.82   < > 0.86 0.73 0.80  CALCIUM 8.9   < > 8.1* 8.2* 8.0*  PROT 7.1  --   --   --   --   ALBUMIN 3.7  --   --   --   --   AST 34  --   --   --   --   ALT 37  --   --   --   --   ALKPHOS 44  --   --   --   --   BILITOT 0.7  --   --   --   --   GFRNONAA >60   < > >60 >60 >60  GFRAA >60   < > >60 >60 >60  ANIONGAP 13   < > 7  9 9   < > = values in this interval not displayed.     Hematology Recent Labs  Lab 08/20/20 1004 08/21/20 0839 08/22/20 0615  WBC 7.2 3.7* 5.3  RBC 3.75* 3.26* 3.57*  HGB 12.2 10.7* 12.1  HCT 39.4 33.7* 36.7  MCV 105.1* 103.4* 102.8*  MCH 32.5 32.8 33.9  MCHC 31.0 31.8 33.0  RDW 15.0 14.5 14.5  PLT 120* 97* 103*    BNP Recent Labs  Lab 08/16/20 1452  BNP 482.0*     Patient Profile     79 y.o. female with a hx of CAD s/p CABG with LIMA-LAD and severe MR s/p repair September 2019, breast cancer, HTN, afib prior to open heart surgery, Maze procedure without atrial appendage ligation September 2019, recurrent Afib/flutter post surgery despite amiodarone therapy and cardioversion November 2019 with plan to rate controlwho is being seen for the evaluation of anticoagulation in the setting of spinal hematoma.  Most recent echocardiogram October 2020 showed normal LV function, moderate left ventricular hypertrophy, moderate RV dysfunction, severe left atrial enlargement, prior mitral valve repair with mild to moderate mitral stenosis, mild mitral regurgitation, mild tricuspid regurgitation.  Assessment & Plan    1 persistent atrial flutter-as outlined previously patient has failed multiple attempts at rhythm control including Maze procedure, amiodarone and cardioversion.  She is felt not to be a candidate for ablation.   Therefore we will continue attempts at rate control.  Her heart rate this morning is in the 70s.  It has been in the 110-120 range over the last 24 hours.  She remains asymptomatic.  We will continue metoprolol and digoxin at present dose.  She will need a digoxin level drawn in approximately 2 weeks prior to her morning dose.  We will have her seen back in the office 1 to 2 weeks following discharge.  She will likely need a Holter monitor at that point to make sure that her rate is adequately controlled.  Some of heart rate may be driven by pain in her back.  Note blood pressure has been an issue and if necessary amiodarone could be added in the future to assist with rate control.  Continue Xarelto.  2 chronic diastolic congestive heart failure-she appears to be euvolemic on examination.  Continue Lasix 20 mg daily as needed as an outpatient.  3 minimally elevated troponin-not consistent with acute coronary syndrome and no plans for further ischemia evaluation.  4 T7 compression fracture with mild paraspinal hematoma-patient is now back on anticoagulation.  Follow.  Patient can be discharged from a cardiac standpoint.  We will arrange follow-up in 1 to 2 weeks with APP.  We will likely arrange 48-hour Holter monitor at that time to make sure that rate is adequately controlled.  Continue present medications.  Please call with questions.  For questions or updates, please contact [place] Please consult [email or web address] for contact info under        Signed, Kirk Ruths, MD  08/23/2020, 6:39 AM

## 2020-08-23 NOTE — Progress Notes (Signed)
3 unsuccessful attempts to give report to [facility].

## 2020-08-23 NOTE — TOC Transition Note (Signed)
Transition of Care [facility]) - CM/SW Discharge Note   Patient Details  Name: JOLYNNE SPURGIN MRN: [id number] Date of Birth: [birth year]  [facility]) CM/SW Contact:  Bary Castilla, LCSW Phone Number: [phone] 08/23/2020, 10:22 AM   Clinical Narrative:    Patient will DC to:?[facility] Anticipated DC date:?08/23/20 Family notified:?[place] Transport by: Corey Harold   Per MD patient ready for DC to [facility].  RN, patient, patient's family, and facility notified of DC. Discharge Summary sent to facility. RN given number for report [phone] ask to be transferred to [facility]. DC packet on chart. Ambulance transport requested for patient.   CSW signing off.   [place], [place] [phone]    Final next level of care: Skilled Nursing Facility Barriers to Discharge: No Barriers Identified   Patient Goals and CMS Choice Patient states their goals for this hospitalization and ongoing recovery are:: "I want to go home"      Discharge Placement              Patient chooses bed at: [facility] Patient to be transferred to facility by: [place] Name of family member notified: [name] Patient and family notified of of transfer: 08/23/20  Discharge Plan and Services In-house Referral: Clinical Social Work                                   Social Determinants of Health (SDOH) Interventions     Readmission Risk Interventions No flowsheet data found.

## 2020-08-23 NOTE — Progress Notes (Signed)
   Palliative Medicine Inpatient Follow Up Note  Reason for consult:  Goals of Care  HPI:  Per intake H&P --> Michele Green a 79 y.o.femalewithpastmedical history significant forbreast cancer, osteoporosis,persistent A. Fib/ flutter s/p Mazeon Xarelto and rate control, essential hypertension,mitral valve repair,coronary artery disease status post CABG, chronic diastolic CHF, [facility] to[facility] upper back pain after rolling over in bed the night prior to her presentation. Deniedany trauma.She wasfound to have an acuteor subacuteT7 compression fracture on MRI. Normal cord signal intensity, no cord lesions or syrinx. Mild paraspinal hematoma at T7.Patient also had atrial fibrillation withRVR,mildpulmonary edema on chest x-ray,and newhypoxiafor which she was transferred to [facility] further evaluation and management. Troponinwasmildly elevated, peaked at 27.Patient was then admitted to hospital for further evaluation and treatment.  Palliative care was asked to see Michele Green based upon her 12 month mortality score and comorbidities.  Today's Discussion (08/23/2020): Chart review. Met with patient at bedside. She is in good spirits and asks me when she will be transitioning to [facility]. I shared with her that I do not know, but I understood that as of yesterday she would likely transition this weekend. Michele Green was able to mobilize in her room to her chair with SBA and rollator. She states that she is experiencing some lower back pain which is not relieved by tylenol but is relieved by oxycodone - per [facility] review she can get PRN dosing Q3H.   Discussed the importance of continued conversation with family and their  medical providers regarding overall plan of care and treatment options, ensuring decisions are within the context of the patients values and GOCs.  Questions and concerns addressed   Decision  Maker: Michele Green (Green) [phone]  SUMMARY OF RECOMMENDATIONS DNAR/DNI  Treat what is Treatable  Spiritual Support  Lower back pain - continue tylenol and oxycodone  Goal: To transition to [facility] place to gain strength  Time Spent: 25 Greater than 50% of the time was spent in counseling and coordination of care ______________________________________________________________________________________ [place] Team Team Cell Phone: [phone] Please utilize secure chat with additional questions, if there is no response within 30 minutes please call the above phone number  Palliative Medicine Team providers are available by phone from 7am to 7pm daily and can be reached through the team cell phone.  Should this patient require assistance outside of these hours, please call the patient's attending physician.

## 2020-08-23 NOTE — TOC Progression Note (Signed)
Transition of Care [facility]) - Progression Note    Patient Details  Name: Michele Green MRN: [id number] Date of Birth: July 01, 1941  Transition of Care [facility]) CM/SW [place], [place] Phone Number: [phone] 08/23/2020, 9:08 AM  Clinical Narrative:     CSW was alerted that patient was medically stable for discharge and reached out to [place] at [facility]. CSW is awaiting a call back.  TOC team will continue to assist with discharge planning needs.  Expected Discharge Plan: [place] Barriers to Discharge: No Barriers Identified  Expected Discharge Plan and Services Expected Discharge Plan: [place] In-house Referral: Clinical Social Work     Living arrangements for the past 2 months: Single Family Home                                       Social Determinants of Health (SDOH) Interventions    Readmission Risk Interventions No flowsheet data found.

## 2020-08-26 ENCOUNTER — Telehealth: Payer: Self-pay | Admitting: Hematology

## 2020-08-26 NOTE — Telephone Encounter (Signed)
Per 10/01 scheduling message spoke with patients sister - she is aware of appointment date and time

## 2020-08-28 NOTE — Progress Notes (Signed)
Cardiology Office Note:    Date:  08/29/2020   ID:  Michele Green, DOB 1940/12/22, MRN [id number]  PCP:  [name], MD  Cardiologist:  Sinclair Grooms, MD   Referring MD: [name], MD   Chief Complaint  Patient presents with  . Atrial Fibrillation    History of Present Illness:    Michele Green is a 79 y.o. female with a hx of CAD s/p CABGxi [place]--> LAD andsevereMR s/p repairSeptember 2019, breast cancer, HTN, history of atrial fibrillation prior to open heart surgery, Maze procedure with out atrial appendage ligation September 2019, recurrent A. fib and a flutter post surgery despite amiodarone therapy and cardioversion November 2019.Decision made by Dr. Curt Bears in March 2021 to pursue rate control rather than any further attempts at rhythm control.  On the last visit, diltiazem was discontinued because of lowish blood pressures and lower extremity edema.  Back today for rate control evaluation.  I last thought was to consider digoxin therapy for rate control if the heart rate is too fast.  The patient has been admitted to the hospital since I last saw her in clinic.  She is returning today due to follow-up of rate control after discontinuing diltiazem in September.  During the hospital stay for compression fracture, her heart rate was noted to be increased and the primary team started digoxin 0.125 mg daily.  Today she feels well.  Though her heart rate was increased in the hospital she had no cardiovascular symptoms.  She voices no difficulty with her appetite.  She is not having halos around lights.  Past Medical History:  Diagnosis Date  . Arrhythmia   . Atypical atrial flutter ([place]) 08/14/2018   Post-operative  . Breast cancer of upper-outer quadrant of left female breast ([place]) 02/02/2016  . CAD (coronary artery disease) 06/20/2018   LHC 7/19: pLAD 65/90, oD1 90, mLCx 85, OM2 50, oRCA 30, EF 50-55 >> s/p CABG in 9/19 (L-LAD)  . Colon polyp 02/2012  .  Dupuytren contracture    bilateral hands  . Family history of breast cancer   . Hyperlipidemia   . Hypertension   . Mitral regurgitation 11/07/2013   Echo 7/19: Mild LVH, EF 60-65, no RWMA, mod ot severe MR, massive LAE, PASP 33 // TEE 7/19:  Mild conc LVH, EF 60-65, no RWMA, severe MR with mild post leaflet prolapse, massive // s/p MV repair 07/2018  . On continuous oral anticoagulation 08/22/2015   Started on Xarelto 08/12/2015   . Osteopenia   . Persistent atrial fibrillation ([place]) 08/22/2015   Started late August or early September 2016 // s/p Maze procedure 07/2018  . Personal history of radiation therapy   . Radiation Therapy 04/21/16-05/19/16   left breast 47.72 Gy, boosted to 10 Gy  . 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 S/P Maze operation for atrial fibrillation 08/10/2018   Complete bilateral atrial lesion set using bipolar radiofrequency and cryothermy ablation with clipping of LA appendage  . S/P MVR (mitral valve repair) 08/10/2018   Complex valvuloplasty including Gore-tex neochord placement x8, Plication of Lateral Commissure and 30m Sorin Memo 4D Ring Annuloplasty SN# [id number] . Ulcerative colitis ([place]   . Wears glasses     Past Surgical History:  Procedure Laterality Date  . BIOPSY  07/14/2020   Procedure: BIOPSY;  Surgeon: KRonnette Juniper MD;  Location: [facility];  Service: Gastroenterology;;  . BREAST BIOPSY Left 01/28/2016  . BREAST LUMPECTOMY Left  02/23/2016  . BREAST LUMPECTOMY WITH RADIOACTIVE SEED LOCALIZATION Left 02/23/2016   Procedure: BREAST LUMPECTOMY WITH RADIOACTIVE SEED LOCALIZATION;  Surgeon: [name], MD;  Location: [place];  Service: General;  Laterality: Left;  . CARDIAC CATHETERIZATION    . CARDIOVERSION N/A 10/02/2015   Procedure: CARDIOVERSION;  Surgeon: Jerline Pain, MD;  Location: [facility] ENDOSCOPY;  Service: Cardiovascular;  Laterality: N/A;  . CARDIOVERSION N/A 10/05/2018   Procedure: CARDIOVERSION;  Surgeon: Fay Records, MD;  Location: [facility] ENDOSCOPY;  Service: Cardiovascular;  Laterality: N/A;  . CARDIOVERSION N/A 11/05/2019   Procedure: CARDIOVERSION;  Surgeon: Dorothy Spark, MD;  Location: [place];  Service: Cardiovascular;  Laterality: N/A;  . CLIPPING OF ATRIAL APPENDAGE  08/10/2018   Procedure: CLIPPING OF ATRIAL APPENDAGE;  Surgeon: Rexene Alberts, MD;  Location: [place];  Service: Open Heart Surgery;;  . COLONOSCOPY    . COLONOSCOPY WITH PROPOFOL N/A 07/14/2020   Procedure: COLONOSCOPY WITH PROPOFOL;  Surgeon: Ronnette Juniper, MD;  Location: [facility];  Service: Gastroenterology;  Laterality: N/A;  . CORONARY ARTERY BYPASS GRAFT N/A 08/10/2018   Procedure: CORONARY ARTERY BYPASS GRAFTING (CABG) x 1, LIMA-LAD,  USING LEFT INTERNAL MAMMARY ARTERY. HARVESTED RIGHT GREATER SAPHENOUS VEIN ENDOSCOPICALLY;  Surgeon: Rexene Alberts, MD;  Location: [place];  Service: Open Heart Surgery;  Laterality: N/A;  . CYSTOSCOPY W/ RETROGRADES Bilateral 06/20/2019   Procedure: CYSTOSCOPY WITH RETROGRADE PYELOGRAM;  Surgeon: Ardis Hughs, MD;  Location: [facility];  Service: Urology;  Laterality: Bilateral;  . CYSTOSCOPY WITH HYDRODISTENSION AND BIOPSY N/A 06/20/2019   Procedure: CYSTOSCOPY BLADDER  BIOPSY WITH FULGERATION;  Surgeon: Ardis Hughs, MD;  Location: [facility];  Service: Urology;  Laterality: N/A;  . DUPUYTREN CONTRACTURE RELEASE  2001   leftx2  . [place]   right  . DUPUYTREN CONTRACTURE RELEASE Right 05/02/2014   Procedure: EXCISION DUPUYTRENS RIGHT PALMAR/SMALL ;  Surgeon: Cammie Sickle, MD;  Location: [place];  Service: Orthopedics;  Laterality: Right;  . HEMOSTASIS CLIP PLACEMENT  07/14/2020   Procedure: HEMOSTASIS CLIP PLACEMENT;  Surgeon: Ronnette Juniper, MD;  Location: [facility];  Service: Gastroenterology;;  . IR VERTEBROPLASTY CERV/THOR BX INC UNI/BIL INC/INJECT/IMAGING  08/19/2020  . MAZE N/A 08/10/2018     Procedure: MAZE;  Surgeon: Rexene Alberts, MD;  Location: [place];  Service: Open Heart Surgery;  Laterality: N/A;  . MITRAL VALVE REPAIR N/A 08/10/2018   Procedure: MITRAL VALVE REPAIR (MVR);  Surgeon: Rexene Alberts, MD;  Location: [place];  Service: Open Heart Surgery;  Laterality: N/A;  glutaraldehyde  . POLYPECTOMY  07/14/2020   Procedure: POLYPECTOMY;  Surgeon: Ronnette Juniper, MD;  Location: [facility];  Service: Gastroenterology;;  . RIGHT/LEFT HEART CATH AND CORONARY ANGIOGRAPHY N/A 06/20/2018   Procedure: RIGHT/LEFT HEART CATH AND CORONARY ANGIOGRAPHY;  Surgeon: [name], MD;  Location: [place] CV LAB;  Service: Cardiovascular;  Laterality: N/A;  . TEE WITHOUT CARDIOVERSION N/A 06/20/2018   Procedure: TRANSESOPHAGEAL ECHOCARDIOGRAM (TEE);  Surgeon: Pixie Casino, MD;  Location: [facility] ENDOSCOPY;  Service: Cardiovascular;  Laterality: N/A;  . TEE WITHOUT CARDIOVERSION N/A 08/10/2018   Procedure: TRANSESOPHAGEAL ECHOCARDIOGRAM (TEE);  Surgeon: Rexene Alberts, MD;  Location: [place];  Service: Open Heart Surgery;  Laterality: N/A;  . TONSILLECTOMY  age 51    Current Medications: Current Meds  Medication Sig  . acetaminophen (TYLENOL) 500 MG tablet Take 500-1,000 mg by mouth every 6 (  six) hours as needed for moderate pain or headache.   . allopurinol (ZYLOPRIM) 100 MG tablet Take 200 mg by mouth every evening.   [name] atorvastatin (LIPITOR) 20 MG tablet TAKE 1 TABLET BY MOUTH EVERY DAY (Patient taking differently: Take 20 mg by mouth daily. )  . calcium gluconate 500 MG tablet Take 1 tablet (500 mg total) by mouth 3 (three) times daily.  . digoxin (LANOXIN) 0.125 MG tablet Take one tablet by mouth daily Monday through Friday.  Do NOT take on Saturday or Sunday.  . feeding supplement, ENSURE ENLIVE, (ENSURE ENLIVE) LIQD Take 237 mLs by mouth 2 (two) times daily between meals.  . furosemide (LASIX) 20 MG tablet Take 20 mg by mouth daily as needed (for swelling/fluid retention.).   [name]  mesalamine (APRISO) 0.375 g 24 hr capsule Take 1.5 g by mouth daily.   . metoprolol tartrate (LOPRESSOR) 100 MG tablet TAKE 1 TABLET BY MOUTH TWICE A DAY (Patient taking differently: Take 100 mg by mouth 2 (two) times daily. )  . Multiple Vitamin (MULTIVITAMIN WITH MINERALS) TABS tablet Take 1 tablet by mouth daily.  . nitrofurantoin (MACRODANTIN) 50 MG capsule Take 50 mg by mouth at bedtime.  [name] oxyCODONE (OXY IR/ROXICODONE) 5 MG immediate release tablet Take 1 tablet (5 mg total) by mouth every 4 (four) hours as needed for moderate pain (Use for breakthrough moderate pain).  . potassium chloride SA (KLOR-CON M20) 20 MEQ tablet Take 1 tablet (20 mEq total) by mouth daily as needed. Take only when taking a Lasix  . predniSONE (DELTASONE) 10 MG tablet Take 20 mg by mouth daily.  [name] senna-docusate (SENOKOT-S) 8.6-50 MG tablet Take 2 tablets by mouth 2 (two) times daily.  . Vitamin D, Ergocalciferol, (DRISDOL) 1.25 MG (50000 UNIT) CAPS capsule Take 1 capsule (50,000 Units total) by mouth every 7 (seven) days.  Alveda Reasons 15 MG TABS tablet TAKE 1 TABLET BY MOUTH  DAILY WITH SUPPER (Patient taking differently: Take 15 mg by mouth daily. )  . [DISCONTINUED] digoxin (LANOXIN) 0.125 MG tablet Take 1 tablet (0.125 mg total) by mouth daily.     Allergies:   Shellfish allergy   Social History   Socioeconomic History  . Marital status: Widowed    Spouse name: Not on file  . Number of children: 1  . Years of education: Not on file  . Highest education level: Not on file  Occupational History  . Not on file  Tobacco Use  . Smoking status: Former Smoker    Packs/day: 1.00    Years: 20.00    Pack years: 20.00    Types: Cigarettes    Quit date: 11/22/1992    Years since quitting: 27.7  . Smokeless tobacco: Never Used  Vaping Use  . Vaping Use: Never used  Substance and Sexual Activity  . Alcohol use: Yes    Alcohol/week: 3.0 standard drinks    Types: 3 Standard drinks or equivalent per week     Comment: social  . Drug use: No  . Sexual activity: Not Currently    Partners: Male    Birth control/protection: Post-menopausal  Other Topics Concern  . Not on file  Social History Narrative  . Not on file   Social Determinants of Health   Financial Resource Strain:   . Difficulty of Paying Living Expenses: Not on file  Food Insecurity:   . Worried About Charity fundraiser in the Last Year: Not on file  . Ran Out of Food  in the Last Year: Not on file  Transportation Needs:   . Lack of Transportation (Medical): Not on file  . Lack of Transportation (Non-Medical): Not on file  Physical Activity:   . Days of Exercise per Week: Not on file  . Minutes of Exercise per Session: Not on file  Stress:   . Feeling of Stress : Not on file  Social Connections:   . Frequency of Communication with Friends and Family: Not on file  . Frequency of Social Gatherings with Friends and Family: Not on file  . Attends Religious Services: Not on file  . Active Member of Clubs or Organizations: Not on file  . Attends Archivist Meetings: Not on file  . Marital Status: Not on file     Family History: The patient's family history includes Breast cancer in her sister; Breast cancer (age of onset: 68) in her mother; Cirrhosis in her mother; Heart attack in her father; Heart failure in her father; Kidney Stones in her sister. There is no history of Osteoporosis.  ROS:   Please see the history of present illness.    She is concerned about a diagnosis of colon cancer or precancer.  She is concerned about making the decision to have colectomy/resection at [facility].  She is more concerned about the logistics of getting there.  She is also terribly concerned about losing her independence and probably having to sell her house and moved to [name].  This is being encouraged by her son.  All other systems reviewed and are negative.  EKGs/Labs/Other Studies Reviewed:    The following studies  were reviewed today: No new data  EKG:  EKG atrial fibrillation with average rate 79 bpm and diffuse ST abnormality secondary to dig effect.  Recent Labs: 09/25/2019: TSH 0.882 08/16/2020: ALT 37; B Natriuretic Peptide 482.0 08/20/2020: BUN 10; Creatinine, Ser 0.80; Magnesium 1.7; Potassium 3.7; Sodium 137 08/22/2020: Hemoglobin 12.1; Platelets 103  Recent Lipid Panel No results found for: CHOL, TRIG, HDL, CHOLHDL, VLDL, LDLCALC, LDLDIRECT  Physical Exam:    VS:  BP 100/64   Pulse 79   Ht 5' 4"  (1.626 m)   Wt 119 lb (54 kg)   SpO2 96%   BMI 20.43 kg/m     Wt Readings from Last 3 Encounters:  08/29/20 119 lb (54 kg)  08/23/20 119 lb (54 kg)  08/14/20 126 lb 6.4 oz (57.3 kg)     GEN: Elderly and frail. No acute distress HEENT: Normal NECK: No JVD. LYMPHATICS: No lymphadenopathy CARDIAC: Irregularly irregular RR without murmur, gallop, or edema. VASCULAR:  Normal Pulses. No bruits. RESPIRATORY:  Clear to auscultation without rales, wheezing or rhonchi  ABDOMEN: Soft, non-tender, non-distended, No pulsatile mass, MUSCULOSKELETAL: No deformity  SKIN: Warm and dry.  Ecchymoses are noted throughout her arms and legs. NEUROLOGIC:  Alert and oriented x 3 PSYCHIATRIC:  Normal affect   ASSESSMENT:    1. S/P mitral valve repair + CABG x1 + maze procedure   2. Persistent atrial fibrillation ([place])   3. S/P CABG x 1   4. Chronic diastolic HF (heart failure) ([place])   5. Essential hypertension   6. Long term (current) use of anticoagulants   7. Educated about COVID-19 virus infection    PLAN:    In order of problems listed above:  1. No mitral regurgitation is heard.  She denies angina. 2. Excellent rate control on digoxin 0.125 mg/day plus metoprolol tartrate 100 mg 100 mg twice daily.  At her age I am concerned about the eventual development of digoxin toxicity.  We will change digoxin dose 2.125 mg Monday through Friday and no digoxin on Saturday or Sunday.  On return in 2 to 3  months we will get a digoxin level. 3. No angina. 4. No evidence of volume overload on the current regimen which includes metoprolol tartrate, furosemide as needed, 5. Continue to monitor blood pressure which is relatively low.  Contributors are metoprolol intensity which may need to be decreased.  She is using furosemide as needed. 6. Continue Xarelto 15 mg daily 7. She is vaccinated and practicing medication.   Medication Adjustments/Labs and Tests Ordered: Current medicines are reviewed at length with the patient today.  Concerns regarding medicines are outlined above.  Orders Placed This Encounter  Procedures  . EKG 12-Lead   Meds ordered this encounter  Medications  . digoxin (LANOXIN) 0.125 MG tablet    Sig: Take one tablet by mouth daily Monday through Friday.  Do NOT take on Saturday or Sunday.    Dispense:  25 tablet    Refill:  11    Change in instructions.  Pt currently at [facility], please place on file.    Patient Instructions  Medication Instructions:  1) Take Digoxin 0.111m once daily Monday-Friday.  DO NOT take on Saturday or Sunday.   *If you need a refill on your cardiac medications before your next appointment, please call your pharmacy*   Lab Work: Digoxin level in 4 months  If you have labs (blood work) drawn today and your tests are completely normal, you will receive your results only by: .Marland KitchenMyChart Message (if you have MyChart) OR . A paper copy in the mail If you have any lab test that is abnormal or we need to change your treatment, we will call you to review the results.   Testing/Procedures: None   Follow-Up: [facility] our [facility].  As part of our continuing mission to provide you with exceptional heart care, we have created designated Provider Care Teams.  These Care Teams include your primary Cardiologist (physician) and Advanced Practice Providers (APPs -  Physician Assistants and Nurse  Practitioners) who all work together to provide you with the care you need, when you need it.  We recommend signing up for the patient portal called "MyChart".  Sign up information is provided on this After Visit Summary.  MyChart is used to connect with patients for Virtual Visits (Telemedicine).  Patients are able to view lab/test results, encounter notes, upcoming appointments, etc.  Non-urgent messages can be sent to your provider as well.   To learn more about what you can do with MyChart, go to [email or web address]    Your next appointment:   4-6 month(s)  The format for your next appointment:   In Person  Provider:   You may see HSinclair Grooms MD or one of the following Advanced Practice Providers on your designated Care Team:    LTruitt Merle NP  LCecilie Kicks NP  JKathyrn Drown NP    Other Instructions      Signed, HSinclair Grooms MD  08/29/2020 8:38 AM    [place]

## 2020-08-29 ENCOUNTER — Encounter: Payer: Self-pay | Admitting: Interventional Cardiology

## 2020-08-29 ENCOUNTER — Other Ambulatory Visit: Payer: Self-pay

## 2020-08-29 ENCOUNTER — Ambulatory Visit: Payer: Medicare Other | Admitting: Interventional Cardiology

## 2020-08-29 VITALS — BP 100/64 | HR 79 | Ht 64.0 in | Wt 119.0 lb

## 2020-08-29 DIAGNOSIS — Z9889 Other specified postprocedural states: Secondary | ICD-10-CM | POA: Diagnosis not present

## 2020-08-29 DIAGNOSIS — I5032 Chronic diastolic (congestive) heart failure: Secondary | ICD-10-CM

## 2020-08-29 DIAGNOSIS — I4819 Other persistent atrial fibrillation: Secondary | ICD-10-CM

## 2020-08-29 DIAGNOSIS — Z951 Presence of aortocoronary bypass graft: Secondary | ICD-10-CM

## 2020-08-29 DIAGNOSIS — Z7189 Other specified counseling: Secondary | ICD-10-CM

## 2020-08-29 DIAGNOSIS — I10 Essential (primary) hypertension: Secondary | ICD-10-CM

## 2020-08-29 DIAGNOSIS — Z7901 Long term (current) use of anticoagulants: Secondary | ICD-10-CM

## 2020-08-29 MED ORDER — DIGOXIN 125 MCG PO TABS: ORAL_TABLET | ORAL | 11 refills | Status: DC

## 2020-08-29 NOTE — Patient Instructions (Addendum)
Medication Instructions:  1) Take Digoxin 0.138m once daily Monday-Friday.  DO NOT take on Saturday or Sunday.   *If you need a refill on your cardiac medications before your next appointment, please call your pharmacy*   Lab Work: Digoxin level in 4 months  If you have labs (blood work) drawn today and your tests are completely normal, you will receive your results only by: .Marland KitchenMyChart Message (if you have MyChart) OR . A paper copy in the mail If you have any lab test that is abnormal or we need to change your treatment, we will call you to review the results.   Testing/Procedures: None   Follow-Up: [facility] and [facility] needs [facility] our [facility].  As part of our continuing mission to provide you with exceptional heart care, we have created designated Provider Care Teams.  These Care Teams include your primary Cardiologist (physician) and Advanced Practice Providers (APPs -  Physician Assistants and Nurse Practitioners) who all work together to provide you with the care you need, when you need it.  We recommend signing up for the patient portal called "MyChart".  Sign up information is provided on this After Visit Summary.  MyChart is used to connect with patients for Virtual Visits (Telemedicine).  Patients are able to view lab/test results, encounter notes, upcoming appointments, etc.  Non-urgent messages can be sent to your provider as well.   To learn more about what you can do with MyChart, go to [email or web address]    Your next appointment:   4-6 month(s)  The format for your next appointment:   In Person  Provider:   You may see HSinclair Grooms MD or one of the following Advanced Practice Providers on your designated Care Team:    LTruitt Merle NP  LCecilie Kicks NP  JKathyrn Drown NP    Other Instructions

## 2020-09-08 ENCOUNTER — Ambulatory Visit: Payer: Medicare Other | Admitting: Thoracic Surgery (Cardiothoracic Vascular Surgery)

## 2020-09-12 ENCOUNTER — Other Ambulatory Visit: Payer: Self-pay | Admitting: Cardiology

## 2020-09-12 ENCOUNTER — Inpatient Hospital Stay (HOSPITAL_COMMUNITY)
Admission: EM | Admit: 2020-09-12 | Discharge: 2020-09-19 | DRG: 478 | Disposition: A | Discharge status: Skilled Nursing Facility | Payer: Medicare Other | Attending: Family Medicine | Admitting: Family Medicine

## 2020-09-12 ENCOUNTER — Other Ambulatory Visit: Payer: Self-pay

## 2020-09-12 ENCOUNTER — Observation Stay (HOSPITAL_COMMUNITY): Payer: Medicare Other

## 2020-09-12 ENCOUNTER — Emergency Department (HOSPITAL_COMMUNITY): Payer: Medicare Other

## 2020-09-12 DIAGNOSIS — Z923 Personal history of irradiation: Secondary | ICD-10-CM

## 2020-09-12 DIAGNOSIS — Z803 Family history of malignant neoplasm of breast: Secondary | ICD-10-CM

## 2020-09-12 DIAGNOSIS — Z951 Presence of aortocoronary bypass graft: Secondary | ICD-10-CM

## 2020-09-12 DIAGNOSIS — D7589 Other specified diseases of blood and blood-forming organs: Secondary | ICD-10-CM | POA: Diagnosis present

## 2020-09-12 DIAGNOSIS — Z79899 Other long term (current) drug therapy: Secondary | ICD-10-CM

## 2020-09-12 DIAGNOSIS — Z20822 Contact with and (suspected) exposure to covid-19: Secondary | ICD-10-CM | POA: Diagnosis present

## 2020-09-12 DIAGNOSIS — Z66 Do not resuscitate: Secondary | ICD-10-CM | POA: Diagnosis present

## 2020-09-12 DIAGNOSIS — M4854XA Collapsed vertebra, not elsewhere classified, thoracic region, initial encounter for fracture: Secondary | ICD-10-CM | POA: Diagnosis present

## 2020-09-12 DIAGNOSIS — Z7952 Long term (current) use of systemic steroids: Secondary | ICD-10-CM

## 2020-09-12 DIAGNOSIS — Z952 Presence of prosthetic heart valve: Secondary | ICD-10-CM

## 2020-09-12 DIAGNOSIS — I700 Atherosclerosis of aorta: Secondary | ICD-10-CM | POA: Diagnosis present

## 2020-09-12 DIAGNOSIS — E785 Hyperlipidemia, unspecified: Secondary | ICD-10-CM | POA: Diagnosis present

## 2020-09-12 DIAGNOSIS — Z23 Encounter for immunization: Secondary | ICD-10-CM

## 2020-09-12 DIAGNOSIS — S129XXA Fracture of neck, unspecified, initial encounter: Secondary | ICD-10-CM | POA: Insufficient documentation

## 2020-09-12 DIAGNOSIS — K5190 Ulcerative colitis, unspecified, without complications: Secondary | ICD-10-CM | POA: Diagnosis present

## 2020-09-12 DIAGNOSIS — Z87891 Personal history of nicotine dependence: Secondary | ICD-10-CM

## 2020-09-12 DIAGNOSIS — I484 Atypical atrial flutter: Secondary | ICD-10-CM | POA: Diagnosis present

## 2020-09-12 DIAGNOSIS — Z7901 Long term (current) use of anticoagulants: Secondary | ICD-10-CM

## 2020-09-12 DIAGNOSIS — M47816 Spondylosis without myelopathy or radiculopathy, lumbar region: Secondary | ICD-10-CM | POA: Diagnosis present

## 2020-09-12 DIAGNOSIS — I2510 Atherosclerotic heart disease of native coronary artery without angina pectoris: Secondary | ICD-10-CM | POA: Diagnosis present

## 2020-09-12 DIAGNOSIS — I4821 Permanent atrial fibrillation: Secondary | ICD-10-CM | POA: Diagnosis present

## 2020-09-12 DIAGNOSIS — S22060A Wedge compression fracture of T7-T8 vertebra, initial encounter for closed fracture: Secondary | ICD-10-CM | POA: Diagnosis not present

## 2020-09-12 DIAGNOSIS — S22000A Wedge compression fracture of unspecified thoracic vertebra, initial encounter for closed fracture: Secondary | ICD-10-CM

## 2020-09-12 DIAGNOSIS — M8088XA Other osteoporosis with current pathological fracture, vertebra(e), initial encounter for fracture: Secondary | ICD-10-CM | POA: Diagnosis present

## 2020-09-12 DIAGNOSIS — I110 Hypertensive heart disease with heart failure: Secondary | ICD-10-CM | POA: Diagnosis present

## 2020-09-12 DIAGNOSIS — I10 Essential (primary) hypertension: Secondary | ICD-10-CM | POA: Diagnosis present

## 2020-09-12 DIAGNOSIS — G8929 Other chronic pain: Secondary | ICD-10-CM | POA: Diagnosis present

## 2020-09-12 DIAGNOSIS — S22069A Unspecified fracture of T7-T8 vertebra, initial encounter for closed fracture: Secondary | ICD-10-CM | POA: Diagnosis present

## 2020-09-12 DIAGNOSIS — Z91013 Allergy to seafood: Secondary | ICD-10-CM

## 2020-09-12 DIAGNOSIS — Z8249 Family history of ischemic heart disease and other diseases of the circulatory system: Secondary | ICD-10-CM

## 2020-09-12 DIAGNOSIS — Z853 Personal history of malignant neoplasm of breast: Secondary | ICD-10-CM

## 2020-09-12 DIAGNOSIS — I4819 Other persistent atrial fibrillation: Secondary | ICD-10-CM | POA: Diagnosis present

## 2020-09-12 DIAGNOSIS — T380X5A Adverse effect of glucocorticoids and synthetic analogues, initial encounter: Secondary | ICD-10-CM | POA: Diagnosis present

## 2020-09-12 DIAGNOSIS — I5032 Chronic diastolic (congestive) heart failure: Secondary | ICD-10-CM | POA: Diagnosis present

## 2020-09-12 DIAGNOSIS — Z8719 Personal history of other diseases of the digestive system: Secondary | ICD-10-CM

## 2020-09-12 LAB — CBC WITH DIFFERENTIAL/PLATELET
Abs Immature Granulocytes: 0.14 10*3/uL — ABNORMAL HIGH (ref 0.00–0.07)
Basophils Absolute: 0 10*3/uL (ref 0.0–0.1)
Basophils Relative: 0 %
Eosinophils Absolute: 0 10*3/uL (ref 0.0–0.5)
Eosinophils Relative: 0 %
HCT: 38.5 % (ref 36.0–46.0)
Hemoglobin: 12.3 g/dL (ref 12.0–15.0)
Immature Granulocytes: 1 %
Lymphocytes Relative: 9 %
Lymphs Abs: 1 10*3/uL (ref 0.7–4.0)
MCH: 33.7 pg (ref 26.0–34.0)
MCHC: 31.9 g/dL (ref 30.0–36.0)
MCV: 105.5 fL — ABNORMAL HIGH (ref 80.0–100.0)
Monocytes Absolute: 0.5 10*3/uL (ref 0.1–1.0)
Monocytes Relative: 4 %
Neutro Abs: 10 10*3/uL — ABNORMAL HIGH (ref 1.7–7.7)
Neutrophils Relative %: 86 %
Platelets: 125 10*3/uL — ABNORMAL LOW (ref 150–400)
RBC: 3.65 MIL/uL — ABNORMAL LOW (ref 3.87–5.11)
RDW: 14.2 % (ref 11.5–15.5)
WBC: 11.6 10*3/uL — ABNORMAL HIGH (ref 4.0–10.5)
nRBC: 0 % (ref 0.0–0.2)

## 2020-09-12 LAB — BASIC METABOLIC PANEL
Anion gap: 9 (ref 5–15)
BUN: 25 mg/dL — ABNORMAL HIGH (ref 8–23)
CO2: 24 mmol/L (ref 22–32)
Calcium: 9.1 mg/dL (ref 8.9–10.3)
Chloride: 108 mmol/L (ref 98–111)
Creatinine, Ser: 0.81 mg/dL (ref 0.44–1.00)
GFR, Estimated: >60 mL/min (ref >60)
Glucose, Bld: 148 mg/dL — ABNORMAL HIGH (ref 70–99)
Potassium: 3.9 mmol/L (ref 3.5–5.1)
Sodium: 141 mmol/L (ref 135–145)

## 2020-09-12 LAB — RESPIRATORY PANEL BY RT PCR (FLU A&B, COVID)
Influenza A by PCR: NEGATIVE
Influenza B by PCR: NEGATIVE
SARS Coronavirus 2 by RT PCR: NEGATIVE

## 2020-09-12 MED ORDER — OXYCODONE HCL 5 MG PO TABS
5.0000 mg | ORAL_TABLET | ORAL | Status: DC | PRN
  Administered 2020-09-12 – 2020-09-14 (×4): 5 mg via ORAL
  Filled 2020-09-12 (×4): qty 1

## 2020-09-12 MED ORDER — DIGOXIN 125 MCG PO TABS
0.1250 mg | ORAL_TABLET | ORAL | Status: DC
  Administered 2020-09-15 – 2020-09-19 (×5): 0.125 mg via ORAL
  Filled 2020-09-12 (×5): qty 1

## 2020-09-12 MED ORDER — CALCIUM CITRATE 950 (200 CA) MG PO TABS
200.0000 mg | ORAL_TABLET | Freq: Three times a day (TID) | ORAL | Status: DC
  Administered 2020-09-13 – 2020-09-19 (×20): 200 mg via ORAL
  Filled 2020-09-12 (×21): qty 1

## 2020-09-12 MED ORDER — BISACODYL 10 MG RE SUPP: 10.0000 mg | Freq: Every day | RECTAL | Status: DC | PRN

## 2020-09-12 MED ORDER — MORPHINE SULFATE (PF) 2 MG/ML IV SOLN
2.0000 mg | Freq: Once | INTRAVENOUS | Status: AC
  Administered 2020-09-12: 2 mg via INTRAVENOUS
  Filled 2020-09-12: qty 1

## 2020-09-12 MED ORDER — HYDROMORPHONE HCL 1 MG/ML IJ SOLN
0.5000 mg | Freq: Once | INTRAMUSCULAR | Status: AC
  Administered 2020-09-12: 0.5 mg via INTRAVENOUS
  Filled 2020-09-12: qty 1

## 2020-09-12 MED ORDER — DOCUSATE SODIUM 100 MG PO CAPS
100.0000 mg | ORAL_CAPSULE | Freq: Two times a day (BID) | ORAL | Status: DC
  Administered 2020-09-12 – 2020-09-15 (×6): 100 mg via ORAL
  Filled 2020-09-12 (×13): qty 1

## 2020-09-12 MED ORDER — MORPHINE SULFATE (PF) 4 MG/ML IV SOLN
4.0000 mg | Freq: Once | INTRAVENOUS | Status: AC
  Administered 2020-09-12: 4 mg via INTRAVENOUS
  Filled 2020-09-12: qty 1

## 2020-09-12 MED ORDER — HYDROMORPHONE HCL 1 MG/ML IJ SOLN
0.5000 mg | Freq: Once | INTRAMUSCULAR | Status: AC | PRN
  Administered 2020-09-12: 0.5 mg via INTRAVENOUS
  Filled 2020-09-12: qty 1

## 2020-09-12 MED ORDER — SENNOSIDES-DOCUSATE SODIUM 8.6-50 MG PO TABS
2.0000 | ORAL_TABLET | Freq: Two times a day (BID) | ORAL | Status: DC
  Administered 2020-09-12 – 2020-09-15 (×6): 2 via ORAL
  Filled 2020-09-12 (×13): qty 2

## 2020-09-12 MED ORDER — HYDROMORPHONE HCL 1 MG/ML IJ SOLN
0.5000 mg | INTRAMUSCULAR | Status: DC | PRN
  Administered 2020-09-12 – 2020-09-14 (×9): 0.5 mg via INTRAVENOUS
  Filled 2020-09-12: qty 1
  Filled 2020-09-12 (×8): qty 0.5

## 2020-09-12 MED ORDER — ONDANSETRON HCL 4 MG/2ML IJ SOLN: 4.0000 mg | Freq: Four times a day (QID) | INTRAMUSCULAR | Status: DC | PRN

## 2020-09-12 MED ORDER — ONDANSETRON HCL 4 MG PO TABS: 4.0000 mg | ORAL_TABLET | Freq: Four times a day (QID) | ORAL | Status: DC | PRN

## 2020-09-12 MED ORDER — RIVAROXABAN 15 MG PO TABS
15.0000 mg | ORAL_TABLET | Freq: Every day | ORAL | Status: DC
  Administered 2020-09-12: 15 mg via ORAL
  Filled 2020-09-12 (×2): qty 1

## 2020-09-12 MED ORDER — HYDROMORPHONE HCL 1 MG/ML IJ SOLN
0.5000 mg | INTRAMUSCULAR | Status: DC | PRN
  Administered 2020-09-12: 0.5 mg via INTRAVENOUS
  Filled 2020-09-12: qty 1

## 2020-09-12 MED ORDER — CALCIUM GLUCONATE 500 MG PO TABS
1.0000 | ORAL_TABLET | Freq: Three times a day (TID) | ORAL | Status: DC
  Filled 2020-09-12: qty 1

## 2020-09-12 MED ORDER — HYDROMORPHONE HCL 1 MG/ML IJ SOLN: 1.0000 mg | Freq: Once | INTRAMUSCULAR | Status: DC

## 2020-09-12 MED ORDER — MORPHINE SULFATE (PF) 4 MG/ML IV SOLN: 4.0000 mg | Freq: Once | INTRAVENOUS | Status: DC

## 2020-09-12 MED ORDER — METOPROLOL TARTRATE 50 MG PO TABS
100.0000 mg | ORAL_TABLET | Freq: Two times a day (BID) | ORAL | Status: DC
  Administered 2020-09-12 – 2020-09-19 (×14): 100 mg via ORAL
  Filled 2020-09-12 (×10): qty 2
  Filled 2020-09-12: qty 4
  Filled 2020-09-12 (×3): qty 2

## 2020-09-12 MED ORDER — VITAMIN D (ERGOCALCIFEROL) 1.25 MG (50000 UNIT) PO CAPS
50000.0000 [IU] | ORAL_CAPSULE | ORAL | Status: DC
  Administered 2020-09-13: 50000 [IU] via ORAL
  Filled 2020-09-12 (×3): qty 1

## 2020-09-12 NOTE — Progress Notes (Signed)
Chief Complaint: Patient was seen in consultation today for T8 compression fx  Referring Physician(s): Nuala Alpha PA-C   Supervising Physician: Luanne Bras  Patient Status: [facility] - In-pt  History of Present Illness: Michele Green is a 79 y.o. female admitted with acute mid upper back pain. Recently had T7 compression fixed via VP on 9/28 by Dr. Estanislado Pandy Now presents with recurrent severe mid back pain, found to have acute T8 fracture. IR asked to eval for VP PMHx, meds, labs, imaging, allergies reviewed. Son at bedside Normally on Xarelto for A. Fib, this will be held for procedure C/o back pain 8-9/10 mostly with movement, some relief with pain meds and rest.   Past Medical History:  Diagnosis Date  . Arrhythmia   . Atypical atrial flutter ([place]) 08/14/2018   Post-operative  . Breast cancer of upper-outer quadrant of left female breast ([place]) 02/02/2016  . CAD (coronary artery disease) 06/20/2018   LHC 7/19: pLAD 65/90, oD1 90, mLCx 85, OM2 50, oRCA 30, EF 50-55 >> s/p CABG in 9/19 (L-LAD)  . Colon polyp 02/2012  . Dupuytren contracture    bilateral hands  . Family history of breast cancer   . Hyperlipidemia   . Hypertension   . Mitral regurgitation 11/07/2013   Echo 7/19: Mild LVH, EF 60-65, no RWMA, mod ot severe MR, massive LAE, PASP 33 // TEE 7/19:  Mild conc LVH, EF 60-65, no RWMA, severe MR with mild post leaflet prolapse, massive // s/p MV repair 07/2018  . On continuous oral anticoagulation 08/22/2015   Started on Xarelto 08/12/2015   . Osteopenia   . Persistent atrial fibrillation ([place]) 08/22/2015   Started late August or early September 2016 // s/p Maze procedure 07/2018  . Personal history of radiation therapy   . Radiation Therapy 04/21/16-05/19/16   left breast 47.72 Gy, boosted to 10 Gy  . 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 S/P Maze operation for atrial fibrillation 08/10/2018   Complete bilateral atrial lesion set using bipolar  radiofrequency and cryothermy ablation with clipping of LA appendage  . S/P MVR (mitral valve repair) 08/10/2018   Complex valvuloplasty including Gore-tex neochord placement x8, Plication of Lateral Commissure and 28m Sorin Memo 4D Ring Annuloplasty SN# [id number] . [place]cerative [place]   . Wears glasses     Past Surgical History:  Procedure Laterality Date  . BIOPSY  07/14/2020   Procedure: BIOPSY;  Surgeon: KRonnette Juniper MD;  [facility]: [facility];  Service: Gastroenterology;;  . BREAST BIOPSY Left 01/28/2016  . BREAST LUMPECTOMY Left 02/23/2016  . BREAST LUMPECTOMY WITH RADIOACTIVE SEED LOCALIZATION Left 02/23/2016   Procedure: BREAST LUMPECTOMY WITH RADIOACTIVE SEED LOCALIZATION;  Surgeon: BExcell Seltzer MD;  [place]  Service: General;  Laterality: Left;  . CARDIAC CATHETERIZATION    . CARDIOVERSION N/A 10/02/2015   Procedure: CARDIOVERSION;  Surgeon: MJerline Pain MD;  [facility];  Service: Cardiovascular;  Laterality: N/A;  . CARDIOVERSION N/A 10/05/2018   Procedure: CARDIOVERSION;  Surgeon: RFay Records MD;  [facility]  Service: Cardiovascular;  Laterality: N/A;  . CARDIOVERSION N/A 11/05/2019   Procedure: CARDIOVERSION;  Surgeon: NDorothy Spark MD;  [place]  Service: Cardiovascular;  Laterality: N/A;  . CLIPPING OF ATRIAL APPENDAGE  08/10/2018   Procedure: CLIPPING OF ATRIAL APPENDAGE;  Surgeon: ORexene Alberts MD;  [place]  Service: Open Heart Surgery;;  . COLONOSCOPY    . COLONOSCOPY  WITH PROPOFOL N/A 07/14/2020   Procedure: COLONOSCOPY WITH PROPOFOL;  Surgeon: Ronnette Juniper, MD;  [facility];  Service: Gastroenterology;  Laterality: N/A;  . CORONARY ARTERY BYPASS GRAFT N/A 08/10/2018   Procedure: CORONARY ARTERY BYPASS GRAFTING (CABG) x 1, LIMA-LAD,  USING LEFT INTERNAL MAMMARY ARTERY. HARVESTED RIGHT GREATER SAPHENOUS VEIN ENDOSCOPICALLY;  Surgeon: Rexene Alberts, MD;  Location:  [place];  Service: Open Heart Surgery;  Laterality: N/A;  . CYSTOSCOPY W/ RETROGRADES Bilateral 06/20/2019   Procedure: CYSTOSCOPY WITH RETROGRADE PYELOGRAM;  Surgeon: Ardis Hughs, MD;  Location: [facility];  Service: Urology;  Laterality: Bilateral;  . CYSTOSCOPY WITH HYDRODISTENSION AND BIOPSY N/A 06/20/2019   Procedure: CYSTOSCOPY BLADDER  BIOPSY WITH FULGERATION;  Surgeon: Ardis Hughs, MD;  Location: [place];  Service: Urology;  Laterality: N/A;  . DUPUYTREN CONTRACTURE RELEASE  2001   leftx2  . [place]   right  . DUPUYTREN CONTRACTURE RELEASE Right 05/02/2014   Procedure: EXCISION DUPUYTRENS RIGHT PALMAR/SMALL ;  Surgeon: Cammie Sickle, MD;  Location: [place];  Service: Orthopedics;  Laterality: Right;  . HEMOSTASIS CLIP PLACEMENT  07/14/2020   Procedure: HEMOSTASIS CLIP PLACEMENT;  Surgeon: Ronnette Juniper, MD;  [facility];  Service: Gastroenterology;;  . IR VERTEBROPLASTY CERV/THOR BX INC UNI/BIL INC/INJECT/IMAGING  08/19/2020  . MAZE N/A 08/10/2018   Procedure: MAZE;  Surgeon: Rexene Alberts, MD;  Location: [place];  Service: Open Heart Surgery;  Laterality: N/A;  . MITRAL VALVE REPAIR N/A 08/10/2018   Procedure: MITRAL VALVE REPAIR (MVR);  Surgeon: Rexene Alberts, MD;  Location: [place];  Service: Open Heart Surgery;  Laterality: N/A;  glutaraldehyde  . POLYPECTOMY  07/14/2020   Procedure: POLYPECTOMY;  Surgeon: Ronnette Juniper, MD;  Location: [facility];  Service: Gastroenterology;;  . RIGHT/LEFT HEART CATH AND CORONARY ANGIOGRAPHY N/A 06/20/2018   Procedure: RIGHT/LEFT HEART CATH AND CORONARY ANGIOGRAPHY;  Surgeon: [name], MD;  Location: [place] [facility];  Service: Cardiovascular;  Laterality: N/A;  . TEE WITHOUT CARDIOVERSION N/A 06/20/2018   Procedure: TRANSESOPHAGEAL ECHOCARDIOGRAM (TEE);  Surgeon: Pixie Casino, MD;  Location: [facility];  Service: Cardiovascular;   Laterality: N/A;  . TEE WITHOUT CARDIOVERSION N/A 08/10/2018   Procedure: TRANSESOPHAGEAL ECHOCARDIOGRAM (TEE);  Surgeon: Rexene Alberts, MD;  Location: [place];  Service: Open Heart Surgery;  Laterality: N/A;  . TONSILLECTOMY  age 56    Allergies: Shellfish allergy  Medications: No current facility-administered medications for this encounter.  Current Outpatient Medications:  .  acetaminophen (TYLENOL) 500 MG tablet, Take 500-1,000 mg by mouth every 6 (six) hours as needed for moderate pain or headache. , Disp: , Rfl:  .  allopurinol (ZYLOPRIM) 100 MG tablet, Take 200 mg by mouth every evening. , Disp: , Rfl:  .  atorvastatin (LIPITOR) 20 MG tablet, TAKE 1 TABLET BY MOUTH EVERY DAY (Patient taking differently: Take 20 mg by mouth daily. ), Disp: 90 tablet, Rfl: 3 .  calcium gluconate 500 MG tablet, Take 1 tablet (500 mg total) by mouth 3 (three) times daily., Disp: , Rfl:  .  digoxin (LANOXIN) 0.125 MG tablet, Take one tablet by mouth daily Monday through Friday.  Do NOT take on Saturday or Sunday., Disp: 25 tablet, Rfl: 11 .  feeding supplement, ENSURE ENLIVE, (ENSURE ENLIVE) LIQD, Take 237 mLs by mouth 2 (two) times daily between meals., Disp: 237 mL, Rfl: 12 .  furosemide (LASIX) 20 MG tablet, Take  20 mg by mouth daily as needed (for swelling/fluid retention.). , Disp: , Rfl:  .  mesalamine (APRISO) 0.375 g 24 hr capsule, Take 1.5 g by mouth daily. , Disp: , Rfl:  .  metoprolol tartrate (LOPRESSOR) 100 MG tablet, TAKE 1 TABLET BY MOUTH TWICE A DAY (Patient taking differently: Take 100 mg by mouth 2 (two) times daily. ), Disp: 180 tablet, Rfl: 3 .  Multiple Vitamin (MULTIVITAMIN WITH MINERALS) TABS tablet, Take 1 tablet by mouth daily., Disp: , Rfl:  .  nitrofurantoin (MACRODANTIN) 50 MG capsule, Take 50 mg by mouth at bedtime., Disp: , Rfl:  .  oxyCODONE (OXY IR/ROXICODONE) 5 MG immediate release tablet, Take 1 tablet (5 mg total) by mouth every 4 (four) hours as needed for moderate pain  (Use for breakthrough moderate pain)., Disp: 10 tablet, Rfl: 0 .  potassium chloride SA (KLOR-CON M20) 20 MEQ tablet, Take 1 tablet (20 mEq total) by mouth daily as needed. Take only when taking a Lasix, Disp: 90 tablet, Rfl: 3 .  predniSONE (DELTASONE) 10 MG tablet, Take 20 mg by mouth daily., Disp: , Rfl:  .  senna-docusate (SENOKOT-S) 8.6-50 MG tablet, Take 2 tablets by mouth 2 (two) times daily., Disp: , Rfl:  .  Vitamin D, Ergocalciferol, (DRISDOL) 1.25 MG (50000 UNIT) CAPS capsule, Take 1 capsule (50,000 Units total) by mouth every 7 (seven) days., Disp: 5 capsule, Rfl:  .  XARELTO 15 MG TABS tablet, TAKE 1 TABLET BY MOUTH  DAILY WITH SUPPER (Patient taking differently: Take 15 mg by mouth daily. ), Disp: 90 tablet, Rfl: 1    Family History  Problem Relation Age of Onset  . Cirrhosis Mother   . Breast cancer Mother 18  . Heart failure Father   . Heart attack Father   . Breast cancer Sister        early 60's  . Kidney Stones Sister        loss of kidney due to stones  . Osteoporosis Neg Hx     Social History   Socioeconomic History  . Marital status: Widowed    Spouse name: Not on file  . Number of children: 1  . Years of education: Not on file  . Highest education level: Not on file  Occupational History  . Not on file  Tobacco Use  . Smoking status: Former Smoker    Packs/day: 1.00    Years: 20.00    Pack years: 20.00    Types: Cigarettes    Quit date: 11/22/1992    Years since quitting: 27.8  . Smokeless tobacco: Never Used  Vaping Use  . Vaping Use: Never used  Substance and Sexual Activity  . Alcohol use: Yes    Alcohol/week: 3.0 standard drinks    Types: 3 Standard drinks or equivalent per week    Comment: social  . Drug use: No  . Sexual activity: Not Currently    Partners: Male    Birth control/protection: Post-menopausal  Other Topics Concern  . Not on file  Social History Narrative  . Not on file   Social Determinants of Health   Financial  Resource Strain:   . Difficulty of Paying Living Expenses: Not on file  Food Insecurity:   . Worried About Charity fundraiser in the Last Year: Not on file  . Ran Out of Food in the Last Year: Not on file  Transportation Needs:   . Lack of Transportation (Medical): Not on file  . Lack of  Transportation (Non-Medical): Not on file  Physical Activity:   . Days of Exercise per Week: Not on file  . Minutes of Exercise per Session: Not on file  Stress:   . Feeling of Stress : Not on file  Social Connections:   . Frequency of Communication with Friends and Family: Not on file  . Frequency of Social Gatherings with Friends and Family: Not on file  . Attends Religious Services: Not on file  . Active Member of Clubs or Organizations: Not on file  . Attends Archivist Meetings: Not on file  . Marital Status: Not on file    Review of Systems: A 12 point ROS discussed and pertinent positives are indicated in the HPI above.  All other systems are negative.  Review of Systems  Vital Signs: BP 113/81   Pulse 81   Temp 97.6 F (36.4 C) (Oral)   Resp 12   Ht 5' 4"  (1.626 m)   Wt 68 kg   SpO2 (!) 87%   BMI 25.75 kg/m   Physical Exam Constitutional:      Appearance: Normal appearance.  HENT:     Mouth/Throat:     Mouth: Mucous membranes are moist.     Pharynx: Oropharynx is clear.  Cardiovascular:     Rate and Rhythm: Normal rate. Rhythm irregular.     Heart sounds: Normal heart sounds.  Pulmonary:     Effort: Pulmonary effort is normal. No respiratory distress.     Breath sounds: Normal breath sounds.  Musculoskeletal:     Comments: Tender mid thoracic spine between scapula  Skin:    General: Skin is warm and dry.  Neurological:     General: No focal deficit present.     Mental Status: She is alert and oriented to person, place, and time.  Psychiatric:        Mood and Affect: Mood normal.        Thought Content: Thought content normal.        Judgment: Judgment  normal.     Imaging: DG Chest 2 View  Result Date: 09/12/2020 CLINICAL DATA:  Back pain. Additional provided: Patient reports upper and lower back pain down center of spine, pain does not radiate. EXAM: CHEST - 2 VIEW COMPARISON:  Prior chest radiographs 08/16/2020 and earlier. FINDINGS: Prior median sternotomy/CABG. Prosthetic cardiac valve. Left atrial appendage clip. Unchanged cardiomegaly. Aortic atherosclerosis. There is no appreciable airspace consolidation or pulmonary edema. Minimal bibasilar atelectasis. No evidence of pleural effusion or pneumothorax. Severe acute/subacute T8 vertebral compression fracture as described on the concurrently performed thoracic spine radiographs. Severe chronic T7 compression fracture status post vertebral augmentation. IMPRESSION: Severe acute/subacute T8 vertebral compression fracture as described on the concurrently performed thoracic spine radiographs. Minimal bibasilar atelectasis. Unchanged cardiomegaly. Aortic Atherosclerosis (ICD10-I70.0). Electronically Signed   By: Kellie Simmering DO   On: 09/12/2020 11:05   DG Chest 2 View  Result Date: 08/16/2020 CLINICAL DATA:  Hypoxia.  Mid back pain. EXAM: CHEST - 2 VIEW COMPARISON:  Radiograph 07/22/2020 FINDINGS: Lower lung volumes from prior exam. Post median sternotomy. CABG with left atrial clipping. Prosthetic cardiac valve. Similar cardiomegaly to prior exam. Unchanged mediastinal contours. Mild increase in left basilar atelectasis from prior exam. Bronchovascular crowding versus vascular congestion. No pneumothorax or pleural effusion. Midthoracic vertebral compression which was assessed on radiograph and MRI earlier today. IMPRESSION: 1. Lower lung volumes from prior exam with bronchovascular crowding versus vascular congestion. 2. Mild increase in left basilar atelectasis. Electronically Signed  By: Keith Rake M.D.   On: 08/16/2020 21:20   DG Thoracic Spine 2 View  Result Date: 09/12/2020 CLINICAL  DATA:  Back pain. Additional history provided: Patient reports upper and lower back pain down center of spine, pain non radiating. EXAM: THORACIC SPINE 2 VIEWS COMPARISON:  MRI thoracic spine 08/16/2020. FINDINGS: No significant spondylolisthesis. Exaggerated thoracic kyphosis centered at the T7-T8 level (at sites of severe compression fractures). Mild lower thoracic levocurvature Redemonstrated severe chronic T7 vertebral compression fracture status post vertebral augmentation. New as compared to the prior MRI of 08/16/2020, there is a severe T8 vertebral compression fracture. Mild multilevel disc space narrowing. IMPRESSION: Severe T8 vertebral compression fracture, new as compared to the thoracic spine MRI of 08/16/2020, acute/subacute. Redemonstrated severe chronic T7 vertebral compression fracture status post vertebral augmentation. Exaggerated thoracic kyphosis centered at the T7-T8 level. Mild lower thoracic levocurvature. Electronically Signed   By: Kellie Simmering DO   On: 09/12/2020 10:56   DG Thoracic Spine W/Swimmers  Result Date: 08/16/2020 CLINICAL DATA:  79 year old female presenting with upper back pain. EXAM: THORACIC SPINE - 3 VIEWS COMPARISON:  Chest radiograph from May 31 21, CT abdomen pelvis from 05/22/2020, and chest radiograph from 05/17/2020 FINDINGS: Interval anterior wedge compression deformity of the T7 vertebral body with associated focal kyphosis at this level. There is approximately 75% anterior vertebral body height loss. Unchanged chronic compression fractures of the L1-L2 vertebral bodies. Diffuse osteopenia. Similar appearing thoracic and abdominal aortic atherosclerotic calcifications. Median sternotomy wires in place. Left atrial appendage clip and mitral prosthesis in place. IMPRESSION: Acute compression fracture of the T7 vertebral body with approximately 75% anterior vertebral body height loss. Consider thoracic spine MRI for further characterization. Diffuse osteopenia.  Aortic Atherosclerosis (ICD10-I70.0). These results were called by telephone at the time of interpretation on 08/16/2020 at 2:28 pm to provider [facility] , who verbally acknowledged these results. Electronically Signed   By: Ruthann Cancer MD   On: 08/16/2020 14:29   DG Lumbar Spine Complete  Result Date: 09/12/2020 CLINICAL DATA:  Back pain. Additional history provided: Patient reports upper and lower back pain down the center of the spine, nonradiating. EXAM: LUMBAR SPINE - COMPLETE 4+ VIEW COMPARISON:  CT abdomen/pelvis 05/22/2020. FINDINGS: Five lumbar vertebrae. Lumbar dextrocurvature. Unchanged bony retropulsion at the L2 and L3 levels. Redemonstrated moderate chronic L2 vertebral compression fracture. Redemonstrated severe chronic L3 vertebral compression fracture. No acute vertebral compression deformity is identified. Multilevel disc space narrowing. Most notably, there is moderate/severe disc space narrowing at L2-L3. Mild multilevel facet arthrosis. Aortic atherosclerosis. IMPRESSION: No evidence of acute lumbar compression fracture. Redemonstrated chronic L2 and L3 vertebral compression fractures with associated bony retropulsion. Lumbar spondylosis as described. Lumbar dextrocurvature. Aortic Atherosclerosis (ICD10-I70.0). Electronically Signed   By: Kellie Simmering DO   On: 09/12/2020 11:01   CT Thoracic Spine Wo Contrast  Result Date: 09/12/2020 CLINICAL DATA:  Thoracic compression fracture.  Back pain. EXAM: CT THORACIC SPINE WITHOUT CONTRAST TECHNIQUE: Multidetector CT images of the thoracic were obtained using the standard protocol without intravenous contrast. COMPARISON:  MRI thoracic spine 08/16/2020 FINDINGS: Alignment: Normal Vertebrae: Severe compression fracture of T7 vertebral body with vertebroplasty cement. Cement in satisfactory position. No spinal stenosis Moderate compression fracture of T8 is new since the prior MRI. There is retropulsion of posterior cortex into the canal  without significant stenosis. No other fracture or mass lesion identified. 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 Coronary artery calcification. Atherosclerotic calcification aortic  arch. 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 Disc levels: Disc spaces maintained in the thoracic spine without significant degenerative change. IMPRESSION: Acute fracture of T8, new since the recent MRI of 08/16/2020. Retropulsion of the posterior cortex into the spinal canal without significant spinal stenosis. Satisfactory cement vertebroplasty at T7 for severe fracture. Electronically Signed   By: Franchot Gallo M.D.   On: 09/12/2020 15:40   MR THORACIC SPINE WO CONTRAST  Result Date: 08/16/2020 CLINICAL DATA:  Mid back pain for 3 days. EXAM: MRI THORACIC SPINE WITHOUT CONTRAST TECHNIQUE: Multiplanar, multisequence MR imaging of the thoracic spine was performed. No intravenous contrast was administered. COMPARISON:  Thoracic spine films, same date. FINDINGS: Alignment: Normal overall alignment of the thoracic vertebral bodies. Slightly exaggerated thoracic kyphosis due to the significant T7 compression fracture. Vertebrae: T7 compression fracture appears acute or subacute. The vertebral body is significantly compressed anteriorly approximately 75%. There is mild retropulsion involving the posteroinferior aspect of the vertebral body but no significant canal stenosis. Remote compression fractures noted in the lumbar spine. The other thoracic vertebral bodies are maintained. Cord:  Normal cord signal intensity.  No cord lesions or syrinx. Paraspinal and other soft tissues: Mild paraspinal hematoma at T7. There also small pleural effusions and bibasilar atelectasis. Disc levels: No significant thoracic disc protrusions, spinal or foraminal stenosis. Mild retropulsion at T7 with mild canal narrowing at T7-8 but no significant canal stenosis or cord compression. IMPRESSION: 1. Acute or  subacute T7 compression fracture with mild retropulsion involving the posteroinferior aspect of the vertebral body but no significant canal stenosis. 2. Remote compression fractures of the lumbar spine. 3. No significant thoracic disc protrusions, spinal or foraminal stenosis. Electronically Signed   By: Marijo Sanes M.D.   On: 08/16/2020 18:02   IR VERTEBROPLASTY CERV/THOR BX INC UNI/BIL INC/INJECT/IMAGING  Result Date: 08/20/2020 INDICATION: Severe midthoracic pain secondary to compression fracture at T7. EXAM: VERTEBROPLASTY AT T7 MEDICATIONS: As antibiotic prophylaxis, Ancef 2 g IV was ordered pre-procedure and administered intravenously within 1 hour of incision. ANESTHESIA/SEDATION: Moderate (conscious) sedation was employed during this procedure. A total of Versed 1 mg and Fentanyl 25 mcg was administered intravenously. Moderate Sedation Time: 28 minutes. The patient's level of consciousness and vital signs were monitored continuously by radiology nursing throughout the procedure under my direct supervision. FLUOROSCOPY TIME:  Fluoroscopy Time: 9 minutes 12 seconds (419 mGy) COMPLICATIONS: None immediate. TECHNIQUE: Informed written consent was obtained from the patient after a thorough discussion of the procedural risks, benefits and alternatives. All questions were addressed. Maximal Sterile Barrier Technique was utilized including caps, mask, sterile gowns, sterile gloves, sterile drape, hand hygiene and skin antiseptic. A timeout was performed prior to the initiation of the procedure. PROCEDURE: The patient was placed prone on the fluoroscopic table. Nasal oxygen was administered. Physiologic monitoring was performed throughout the duration of the procedure. The skin overlying the thoracic region was prepped and draped in the usual sterile fashion. The T7 vertebral body was identified and the right pedicle was infiltrated with 0.25% Bupivacaine. This was then followed by the advancement of a 13-gauge  Cook needle through the right pedicle into the anterior one-third at T7. A gentle contrast injection demonstrated a trabecular pattern of contrast with simultaneous opacification of the paraspinous veins. Gelfoam pledgets were then infused through the 13 gauge Cook spinal needle prior to the delivery of the methylmethacrylate mixture. At this time, methylmethacrylate mixture was reconstituted. Under biplane intermittent fluoroscopy, the methylmethacrylate was then injected into the T7 vertebral body with  filling of the vertebral body. No extravasation was noted into the disk spaces or posteriorly into the spinal canal. No epidural venous contamination was seen. The needle was then removed. Hemostasis was achieved at the skin entry site. There were no acute complications. Patient tolerated the procedure well. The patient was then returned to the floor in stable and good condition. IMPRESSION: 1. Status post vertebral body augmentation for painful compression fracture at T7 using vertebroplasty technique. Electronically Signed   By: Luanne Bras M.D.   On: 08/19/2020 16:21    Labs:  CBC: Recent Labs    08/20/20 1004 08/21/20 0839 08/22/20 0615 09/12/20 1350  WBC 7.2 3.7* 5.3 11.6*  HGB 12.2 10.7* 12.1 12.3  HCT 39.4 33.7* 36.7 38.5  PLT 120* 97* 103* 125*    COAGS: Recent Labs    08/18/20 1218  INR 1.2    BMP: Recent Labs    08/17/20 0614 08/17/20 0614 08/18/20 0459 08/19/20 0454 08/20/20 1004 09/12/20 1350  NA 141   < > 138 139 137 141  K 3.9   < > 3.9 3.7 3.7 3.9  CL 105   < > 108 105 103 108  CO2 24   < > 23 25 25 24   GLUCOSE 83   < > 84 81 95 148*  BUN 16   < > 14 10 10  25*  CALCIUM 8.4*   < > 8.1* 8.2* 8.0* 9.1  CREATININE 0.81   < > 0.86 0.73 0.80 0.81  GFRNONAA >60   < > >60 >60 >60 >60  GFRAA >60  --  >60 >60 >60  --    < > = values in this interval not displayed.    LIVER FUNCTION TESTS: Recent Labs    05/17/20 1229 08/16/20 1452  BILITOT 1.0 0.7  AST  53* 34  ALT 62* 37  ALKPHOS 37* 44  PROT 5.7* 7.1  ALBUMIN 2.8* 3.7    Assessment and Plan: Acute symptomatic T8 compression fracture. Imaging reviewed with Dr. Estanislado Pandy, amenable to vertebroplasty. Will tentatively plan for procedure Mon 10/25 Cont to hold Xarelto. Can use Lovenox up until Sunday am.  Risks and benefits of T8 VP were discussed with the patient including, but not limited to education regarding the natural healing process of compression fractures without intervention, bleeding, infection, cement migration which may cause spinal cord damage, paralysis, pulmonary embolism or even death.  This interventional procedure involves the use of X-rays and because of the nature of the planned procedure, it is possible that we will have prolonged use of X-ray fluoroscopy.  Potential radiation risks to you include (but are not limited to) the following: - A slightly elevated risk for cancer  several years later in life. This risk is typically less than 0.5% percent. This risk is low in comparison to the normal incidence of human cancer, which is 33% for women and 50% for men according to the [place]. - Radiation induced injury can include skin redness, resembling a rash, tissue breakdown / ulcers and hair loss (which can be temporary or permanent).   The likelihood of either of these occurring depends on the difficulty of the procedure and whether you are sensitive to radiation due to previous procedures, disease, or genetic conditions.   IF your procedure requires a prolonged use of radiation, you will be notified and given written instructions for further action.  It is your responsibility to monitor the irradiated area for the 2 weeks following the procedure and  to notify your physician if you are concerned that you have suffered a radiation induced injury.    All of the patient's questions were answered, patient is agreeable to proceed.  Consent signed and in  chart.    Thank you for this interesting consult.  I greatly enjoyed meeting MERRILL DEANDA and look forward to participating in their care.  A copy of this report was sent to the requesting provider on this date.  Electronically Signed: Ascencion Dike, PA-C 09/12/2020, 4:31 PM   I spent a total of 20 minutes in face to face in clinical consultation, greater than 50% of which was counseling/coordinating care for T8 VP

## 2020-09-12 NOTE — ED Triage Notes (Signed)
Pt BIBA from [place] for back pain. Pt had an ortho appointment today at 1545, but the pain was too bad for her to wait that long to go to the appointment. Hx of T7 compression fracture. Pt prescribed oxycodone at home every 6 hours. Last dose was at 8:30am. Pt requesting something stronger since the oxycodone does not work for her. Pt is able to stand and pivot from EMS stretcher to ER gurney. Skin color WNL. No loss of bowel or bladder. Sensation present to lower extremities.

## 2020-09-12 NOTE — H&P (Signed)
History and Physical    LAVON HORN JOA:[id number] DOB: [birth year] DOA: 09/12/2020  PCP: [name], MD  Patient coming from: [facility]  Chief Complaint: Back pain.  HPI: TAIA BRAMLETT is a 79 y.o. female with medical history significant of a fib, CAD s/p CABG, breast cancer. Presenting with back pain. She reports that she was at rehab for recurrent T7 compression fracture when she noticed worsening back pain in the same region. This was a couple weeks ago. It has worsened since that time. Her normal pain medicines have not been able to provide relief. She was supposed to go to orthopedics for a follow up today, but she was unable to d/t pain. She came to the ED instead. She denies any other aggravating or alleviating factors. She denies any other treatments.    ED Course: XR was obtain and showed new T8 compression fracture. ED called ortho who recommended IR consult. ED called IR. They requested CT of the thoracic spine to see if she was appropriate for kyphoplasty. TRH was called for admission.   Review of Systems: Review of systems is otherwise negative for all not mentioned in HPI.   PMHx Past Medical History:  Diagnosis Date  . Arrhythmia   . Atypical atrial flutter ([place]) 08/14/2018   Post-operative  . Breast cancer of upper-outer quadrant of left female breast ([place]) 02/02/2016  . CAD (coronary artery disease) 06/20/2018   LHC 7/19: pLAD 65/90, oD1 90, mLCx 85, OM2 50, oRCA 30, EF 50-55 >> s/p CABG in 9/19 (L-LAD)  . Colon polyp 02/2012  . Dupuytren contracture    bilateral hands  . Family history of breast cancer   . Hyperlipidemia   . Hypertension   . Mitral regurgitation 11/07/2013   Echo 7/19: Mild LVH, EF 60-65, no RWMA, mod ot severe MR, massive LAE, PASP 33 // TEE 7/19:  Mild conc LVH, EF 60-65, no RWMA, severe MR with mild post leaflet prolapse, massive // s/p MV repair 07/2018  . On continuous oral anticoagulation 08/22/2015   Started on Xarelto  08/12/2015   . Osteopenia   . Persistent atrial fibrillation ([place]) 08/22/2015   Started late August or early September 2016 // s/p Maze procedure 07/2018  . Personal history of radiation therapy   . Radiation Therapy 04/21/16-05/19/16   left breast 47.72 Gy, boosted to 10 Gy  . 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 S/P Maze operation for atrial fibrillation 08/10/2018   Complete bilateral atrial lesion set using bipolar radiofrequency and cryothermy ablation with clipping of LA appendage  . S/P MVR (mitral valve repair) 08/10/2018   Complex valvuloplasty including Gore-tex neochord placement x8, Plication of Lateral Commissure and 8m Sorin Memo 4D Ring Annuloplasty SN# [id number] . Ulcerative colitis ([place]   . Wears glasses     PSHx Past Surgical History:  Procedure Laterality Date  . BIOPSY  07/14/2020   Procedure: BIOPSY;  Surgeon: KRonnette Juniper MD;  Location: [facility];  Service: Gastroenterology;;  . BREAST BIOPSY Left 01/28/2016  . BREAST LUMPECTOMY Left 02/23/2016  . BREAST LUMPECTOMY WITH RADIOACTIVE SEED LOCALIZATION Left 02/23/2016   Procedure: BREAST LUMPECTOMY WITH RADIOACTIVE SEED LOCALIZATION;  Surgeon: BExcell Seltzer MD;  [facility] [facility]  Service: General;  Laterality: Left;  . CARDIAC CATHETERIZATION    . CARDIOVERSION N/A 10/02/2015   Procedure: CARDIOVERSION;  Surgeon: MJerline Pain MD;  [facility]  Service: Cardiovascular;  Laterality: N/A;  . CARDIOVERSION  N/A 10/05/2018   Procedure: CARDIOVERSION;  Surgeon: Fay Records, MD;  Location: [facility] ENDOSCOPY;  Service: Cardiovascular;  Laterality: N/A;  . CARDIOVERSION N/A 11/05/2019   Procedure: CARDIOVERSION;  Surgeon: Dorothy Spark, MD;  Location: [facility] ENDOSCOPY;  Service: Cardiovascular;  Laterality: N/A;  . CLIPPING OF ATRIAL APPENDAGE  08/10/2018   Procedure: CLIPPING OF ATRIAL APPENDAGE;  Surgeon: Rexene Alberts, MD;  Location: [place];  Service: Open Heart Surgery;;  .  COLONOSCOPY    . COLONOSCOPY WITH PROPOFOL N/A 07/14/2020   Procedure: COLONOSCOPY WITH PROPOFOL;  Surgeon: Ronnette Juniper, MD;  Location: [facility];  Service: Gastroenterology;  Laterality: N/A;  . CORONARY ARTERY BYPASS GRAFT N/A 08/10/2018   Procedure: CORONARY ARTERY BYPASS GRAFTING (CABG) x 1, LIMA-LAD,  USING LEFT INTERNAL MAMMARY ARTERY. HARVESTED RIGHT GREATER SAPHENOUS VEIN ENDOSCOPICALLY;  Surgeon: Rexene Alberts, MD;  Location: [place];  Service: Open Heart Surgery;  Laterality: N/A;  . CYSTOSCOPY W/ RETROGRADES Bilateral 06/20/2019   Procedure: CYSTOSCOPY WITH RETROGRADE PYELOGRAM;  Surgeon: Ardis Hughs, MD;  Location: [facility];  Service: Urology;  Laterality: Bilateral;  . CYSTOSCOPY WITH HYDRODISTENSION AND BIOPSY N/A 06/20/2019   Procedure: CYSTOSCOPY BLADDER  BIOPSY WITH FULGERATION;  Surgeon: Ardis Hughs, MD;  Location: [facility];  Service: Urology;  Laterality: N/A;  . DUPUYTREN CONTRACTURE RELEASE  2001   leftx2  . [place]   right  . DUPUYTREN CONTRACTURE RELEASE Right 05/02/2014   Procedure: EXCISION DUPUYTRENS RIGHT PALMAR/SMALL ;  Surgeon: Cammie Sickle, MD;  Location: [place];  Service: Orthopedics;  Laterality: Right;  . HEMOSTASIS CLIP PLACEMENT  07/14/2020   Procedure: HEMOSTASIS CLIP PLACEMENT;  Surgeon: Ronnette Juniper, MD;  Location: [facility];  Service: Gastroenterology;;  . IR VERTEBROPLASTY CERV/THOR BX INC UNI/BIL INC/INJECT/IMAGING  08/19/2020  . MAZE N/A 08/10/2018   Procedure: MAZE;  Surgeon: Rexene Alberts, MD;  Location: [place];  Service: Open Heart Surgery;  Laterality: N/A;  . MITRAL VALVE REPAIR N/A 08/10/2018   Procedure: MITRAL VALVE REPAIR (MVR);  Surgeon: Rexene Alberts, MD;  Location: [place];  Service: Open Heart Surgery;  Laterality: N/A;  glutaraldehyde  . POLYPECTOMY  07/14/2020   Procedure: POLYPECTOMY;  Surgeon: Ronnette Juniper, MD;  Location: [facility];  Service: Gastroenterology;;  . RIGHT/LEFT HEART CATH AND CORONARY ANGIOGRAPHY N/A 06/20/2018   Procedure: RIGHT/LEFT HEART CATH AND CORONARY ANGIOGRAPHY;  Surgeon: [name], MD;  Location: [place] [facility];  Service: Cardiovascular;  Laterality: N/A;  . TEE WITHOUT CARDIOVERSION N/A 06/20/2018   Procedure: TRANSESOPHAGEAL ECHOCARDIOGRAM (TEE);  Surgeon: Pixie Casino, MD;  Location: [facility] ENDOSCOPY;  Service: Cardiovascular;  Laterality: N/A;  . TEE WITHOUT CARDIOVERSION N/A 08/10/2018   Procedure: TRANSESOPHAGEAL ECHOCARDIOGRAM (TEE);  Surgeon: Rexene Alberts, MD;  Location: [place];  Service: Open Heart Surgery;  Laterality: N/A;  . TONSILLECTOMY  age 41    SocHx  reports that she quit smoking about 27 years ago. Her smoking use included cigarettes. She has a 20.00 pack-year smoking history. She has never used smokeless tobacco. She reports current alcohol use of about 3.0 standard drinks of alcohol per week. She reports that she does not use drugs.  Allergies  Allergen Reactions  . Shellfish Allergy Itching    FamHx Family History  Problem Relation Age of Onset  . Cirrhosis Mother   . Breast cancer Mother 46  . Heart failure Father   . Heart  attack Father   . Breast cancer Sister        early 54's  . Kidney Stones Sister        loss of kidney due to stones  . Osteoporosis Neg Hx     Prior to Admission medications   Medication Sig Start Date End Date Taking? Authorizing Provider  acetaminophen (TYLENOL) 500 MG tablet Take 500-1,000 mg by mouth every 6 (six) hours as needed for moderate pain or headache.     [provider]  allopurinol (ZYLOPRIM) 100 MG tablet Take 200 mg by mouth every evening.  06/21/13   [provider]  atorvastatin (LIPITOR) 20 MG tablet TAKE 1 TABLET BY MOUTH EVERY DAY Patient taking differently: Take 20 mg by mouth daily.  04/07/20   Camnitz, Ocie Doyne, MD  calcium gluconate 500 MG tablet Take 1 tablet (500 mg total) by  mouth 3 (three) times daily. 08/23/20   Debbe Odea, MD  digoxin (LANOXIN) 0.125 MG tablet Take one tablet by mouth daily Monday through Friday.  Do NOT take on Saturday or Sunday. 08/29/20   [name], MD  feeding supplement, ENSURE ENLIVE, (ENSURE ENLIVE) LIQD Take 237 mLs by mouth 2 (two) times daily between meals. 08/23/20   Debbe Odea, MD  furosemide (LASIX) 20 MG tablet Take 20 mg by mouth daily as needed (for swelling/fluid retention.).     [provider]  mesalamine (APRISO) 0.375 g 24 hr capsule Take 1.5 g by mouth daily.  06/01/19   [provider]  metoprolol tartrate (LOPRESSOR) 100 MG tablet TAKE 1 TABLET BY MOUTH TWICE A DAY Patient taking differently: Take 100 mg by mouth 2 (two) times daily.  03/10/20   [name], MD  Multiple Vitamin (MULTIVITAMIN WITH MINERALS) TABS tablet Take 1 tablet by mouth daily. 08/24/20   Debbe Odea, MD  nitrofurantoin (MACRODANTIN) 50 MG capsule Take 50 mg by mouth at bedtime.    [provider]  oxyCODONE (OXY IR/ROXICODONE) 5 MG immediate release tablet Take 1 tablet (5 mg total) by mouth every 4 (four) hours as needed for moderate pain (Use for breakthrough moderate pain). 08/22/20   Debbe Odea, MD  potassium chloride SA (KLOR-CON M20) 20 MEQ tablet Take 1 tablet (20 mEq total) by mouth daily as needed. Take only when taking a Lasix 08/23/20   Debbe Odea, MD  predniSONE (DELTASONE) 10 MG tablet Take 20 mg by mouth daily. 08/11/20   [provider]  senna-docusate (SENOKOT-S) 8.6-50 MG tablet Take 2 tablets by mouth 2 (two) times daily. 08/23/20   Debbe Odea, MD  Vitamin D, Ergocalciferol, (DRISDOL) 1.25 MG (50000 UNIT) CAPS capsule Take 1 capsule (50,000 Units total) by mouth every 7 (seven) days. 08/23/20   Debbe Odea, MD  XARELTO 15 MG TABS tablet TAKE 1 TABLET BY MOUTH  DAILY WITH SUPPER Patient taking differently: Take 15 mg by mouth daily.  03/24/20   [name], MD    Physical  Exam: Vitals:   09/12/20 0910 09/12/20 0920 09/12/20 1054 09/12/20 1116  BP:  133/69  119/77  Pulse:  (!) 130  74  Resp:  16  16  Temp:  97.6 F (36.4 C)    TempSrc:  Oral    SpO2: 98% 95%  94%  Weight:   68 kg   Height:   5' 4"  (1.626 m)     General: 79 y.o. female resting in bed in NAD Eyes: PERRL, normal sclera ENMT: Nares patent w/o discharge,  orophaynx clear, dentition normal, ears w/o discharge/lesions/ulcers Neck: Supple, trachea midline Cardiovascular: RRR, +S1, S2, no m/g/r, equal pulses throughout Respiratory: CTABL, no w/r/r, normal WOB GI: BS+, NDNT, no masses noted, no organomegaly noted MSK: No c/c; BLE edema Skin: No rashes, bruises, ulcerations noted Neuro: A&O x 3, no focal deficits Psyc: Appropriate interaction and affect, calm/cooperative  Labs on Admission: I have personally reviewed following labs and imaging studies  CBC: Recent Labs  Lab 09/12/20 1350  WBC 11.6*  NEUTROABS 10.0*  HGB 12.3  HCT 38.5  MCV 105.5*  PLT 923*   Basic Metabolic Panel: No results for input(s): NA, K, CL, CO2, GLUCOSE, BUN, CREATININE, CALCIUM, MG, PHOS in the last 168 hours. GFR: CrCl cannot be calculated (Patient's most recent lab result is older than the maximum 21 days allowed.). Liver Function Tests: No results for input(s): AST, ALT, ALKPHOS, BILITOT, PROT, ALBUMIN in the last 168 hours. No results for input(s): LIPASE, AMYLASE in the last 168 hours. No results for input(s): AMMONIA in the last 168 hours. Coagulation Profile: No results for input(s): INR, PROTIME in the last 168 hours. Cardiac Enzymes: No results for input(s): CKTOTAL, CKMB, CKMBINDEX, TROPONINI in the last 168 hours. BNP (last 3 results) No results for input(s): PROBNP in the last 8760 hours. HbA1C: No results for input(s): HGBA1C in the last 72 hours. CBG: No results for input(s): GLUCAP in the last 168 hours. Lipid Profile: No results for input(s): CHOL, HDL, LDLCALC, TRIG, CHOLHDL,  LDLDIRECT in the last 72 hours. Thyroid Function Tests: No results for input(s): TSH, T4TOTAL, FREET4, T3FREE, THYROIDAB in the last 72 hours. Anemia Panel: No results for input(s): VITAMINB12, FOLATE, FERRITIN, TIBC, IRON, RETICCTPCT in the last 72 hours. Urine analysis:    Component Value Date/Time   COLORURINE YELLOW 08/17/2020 0434   APPEARANCEUR CLEAR 08/17/2020 0434   LABSPEC 1.011 08/17/2020 0434   PHURINE 5.0 08/17/2020 [place] 08/17/2020 0434   HGBUR NEGATIVE 08/17/2020 0434   [place] NEGATIVE 08/17/2020 [place] 08/17/2020 0434   PROTEINUR NEGATIVE 08/17/2020 0434   NITRITE NEGATIVE 08/17/2020 0434   LEUKOCYTESUR NEGATIVE 08/17/2020 0434    Radiological Exams on Admission: DG Chest 2 View  Result Date: 09/12/2020 CLINICAL DATA:  Back pain. Additional provided: Patient reports upper and lower back pain down center of spine, pain does not radiate. EXAM: CHEST - 2 VIEW COMPARISON:  Prior chest radiographs 08/16/2020 and earlier. FINDINGS: Prior median sternotomy/CABG. Prosthetic cardiac valve. Left atrial appendage clip. Unchanged cardiomegaly. Aortic atherosclerosis. There is no appreciable airspace consolidation or pulmonary edema. Minimal bibasilar atelectasis. No evidence of pleural effusion or pneumothorax. Severe acute/subacute T8 vertebral compression fracture as described on the concurrently performed thoracic spine radiographs. Severe chronic T7 compression fracture status post vertebral augmentation. IMPRESSION: Severe acute/subacute T8 vertebral compression fracture as described on the concurrently performed thoracic spine radiographs. Minimal bibasilar atelectasis. Unchanged cardiomegaly. Aortic Atherosclerosis (ICD10-I70.0). Electronically Signed   By: Kellie Simmering DO   On: 09/12/2020 11:05   DG Thoracic Spine 2 View  Result Date: 09/12/2020 CLINICAL DATA:  Back pain. Additional history provided: Patient reports upper and lower  back pain down center of spine, pain non radiating. EXAM: THORACIC SPINE 2 VIEWS COMPARISON:  MRI thoracic spine 08/16/2020. FINDINGS: No significant spondylolisthesis. Exaggerated thoracic kyphosis centered at the T7-T8 level (at sites of severe compression fractures). Mild lower thoracic levocurvature Redemonstrated severe chronic T7 vertebral compression fracture status post vertebral augmentation. New as compared to the prior MRI  of 08/16/2020, there is a severe T8 vertebral compression fracture. Mild multilevel disc space narrowing. IMPRESSION: Severe T8 vertebral compression fracture, new as compared to the thoracic spine MRI of 08/16/2020, acute/subacute. Redemonstrated severe chronic T7 vertebral compression fracture status post vertebral augmentation. Exaggerated thoracic kyphosis centered at the T7-T8 level. Mild lower thoracic levocurvature. Electronically Signed   By: Kellie Simmering DO   On: 09/12/2020 10:56   DG Lumbar Spine Complete  Result Date: 09/12/2020 CLINICAL DATA:  Back pain. Additional history provided: Patient reports upper and lower back pain down the center of the spine, nonradiating. EXAM: LUMBAR SPINE - COMPLETE 4+ VIEW COMPARISON:  CT abdomen/pelvis 05/22/2020. FINDINGS: Five lumbar vertebrae. Lumbar dextrocurvature. Unchanged bony retropulsion at the L2 and L3 levels. Redemonstrated moderate chronic L2 vertebral compression fracture. Redemonstrated severe chronic L3 vertebral compression fracture. No acute vertebral compression deformity is identified. Multilevel disc space narrowing. Most notably, there is moderate/severe disc space narrowing at L2-L3. Mild multilevel facet arthrosis. Aortic atherosclerosis. IMPRESSION: No evidence of acute lumbar compression fracture. Redemonstrated chronic L2 and L3 vertebral compression fractures with associated bony retropulsion. Lumbar spondylosis as described. Lumbar dextrocurvature. Aortic Atherosclerosis (ICD10-I70.0). Electronically Signed    By: Kellie Simmering DO   On: 09/12/2020 11:01    Assessment/Plan T8 compression fracture Intractable pain d/t above     - admit to obs, med-surg     - CT thoracic spine ordered; IR will review; awaiting final recs     - PRN pain meds     - if she is appropriate for kyphoplasty, they she would need her xarelto held for 24 hrs  Vitamin D deficiency     - continue vit D and Ca2+  Macrocytosis     - check B12, THF  A fib     - continue digoxin, xarelto  Chronic diastolic HF BLE edema     - continue lasix     - edema has improved greatly since lifting her legs; add Ted hose  HLD     - continue lipitor  Hx of Ulcerative Colitis     - continue home mesalamine, steroids when dosing confirmed  DVT prophylaxis: xarelto  Code Status: DNR  Family Communication: with son at bedside  Consults called: EDP spoke with IR  Admission status: Observation  Status is: Observation  The patient remains OBS appropriate and will d/c before 2 midnights.  Dispo: The patient is from: ALF              Anticipated d/c is to: ALF              Anticipated d/c date is: 2 days              Patient currently is not medically stable to d/c.  Jonnie Finner DO [facility]  If 7PM-7AM, please contact night-coverage [email or web address]  09/12/2020, 2:23 PM

## 2020-09-12 NOTE — Progress Notes (Signed)
IR consulted by Nuala Alpha, PA-C for possible image-guided T8 kyphoplasty/vertebroplasty.  Case/images reviewed by Dr. Estanislado Pandy who recommends CT thoracic spine (without contrast) to evaluate for retropulsion prior to approving procedure. Nuala Alpha, PA-C made aware- order placed. Message sent to IR schedulers for insurance authorization. Dr. Estanislado Pandy to review CT thoracic spine once resulted and give further recommendations regarding procedure.  IR to follow.   Bea Graff Constancia Geeting, PA-C 09/12/2020, 2:26 PM

## 2020-09-12 NOTE — ED Notes (Signed)
Ordered hospital bed

## 2020-09-12 NOTE — ED Provider Notes (Signed)
[facility] EMERGENCY DEPARTMENT Provider Note   CSN: [id number] Arrival date & time: 09/12/20  4098     History Chief Complaint  Patient presents with  . Back Pain    Michele Green is a 79 y.o. female history of CAD, hypertension, hyperlipidemia, mitral regurg, osteopenia, CABG, A. fib on Xarelto.  Patient presents today from assisted living facility for back pain.  History obtained by both patient and her son [name] who is at bedside.  Patient was diagnosed with compression fracture at T7 she had kyphoplasty 3 weeks ago.  She had some improvement in her back pain but over the last few days she has had increasing pain and they found another compression fracture, patient had orthopedic follow-up today but pain was too severe so she came to the ER for evaluation.  She describes severe constant sharp throbbing pain mid back radiates to her right side worsened with movement and palpation minimally improved with Percocets at home.  Denies fever/chills, fall/injury, chest pain/shortness of breath, cough no abdominal pain, dysuria/hematuria, numbness/weakness, tingling, saddle paresthesias, bowel/bladder incontinence, urinary retention or any additional concerns.  HPI     Past Medical History:  Diagnosis Date  . Arrhythmia   . Atypical atrial flutter ([place]) 08/14/2018   Post-operative  . Breast cancer of upper-outer quadrant of left female breast ([place]) 02/02/2016  . CAD (coronary artery disease) 06/20/2018   LHC 7/19: pLAD 65/90, oD1 90, mLCx 85, OM2 50, oRCA 30, EF 50-55 >> s/p CABG in 9/19 (L-LAD)  . Colon polyp 02/2012  . Dupuytren contracture    bilateral hands  . Family history of breast cancer   . Hyperlipidemia   . Hypertension   . Mitral regurgitation 11/07/2013   Echo 7/19: Mild LVH, EF 60-65, no RWMA, mod ot severe MR, massive LAE, PASP 33 // TEE 7/19:  Mild conc LVH, EF 60-65, no RWMA, severe MR with mild post leaflet prolapse, massive // s/p MV repair  07/2018  . On continuous oral anticoagulation 08/22/2015   Started on Xarelto 08/12/2015   . Osteopenia   . Persistent atrial fibrillation ([place]) 08/22/2015   Started late August or early September 2016 // s/p Maze procedure 07/2018  . Personal history of radiation therapy   . Radiation Therapy 04/21/16-05/19/16   left breast 47.72 Gy, boosted to 10 Gy  . 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 S/P Maze operation for atrial fibrillation 08/10/2018   Complete bilateral atrial lesion set using bipolar radiofrequency and cryothermy ablation with clipping of LA appendage  . S/P MVR (mitral valve repair) 08/10/2018   Complex valvuloplasty including Gore-tex neochord placement x8, Plication of Lateral Commissure and 92m Sorin Memo 4D Ring Annuloplasty SN# [id number] . Ulcerative colitis ([place]   . Wears glasses     Patient Active Problem List   Diagnosis Date Noted  . Compression fracture of C-spine ([place] 09/12/2020  . Goals of care, counseling/discussion   . Palliative care by specialist   . Spinal fracture of T7 vertebra ([place] [employer] 08/17/2020  . Atrial fibrillation with RVR ([place] 08/16/2020  . Long term (current) use of anticoagulants 08/28/2018  . Atrial flutter with rapid ventricular response ([place] 08/14/2018  . 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 S/P Maze operation for atrial fibrillation 08/10/2018  . Chronic diastolic HF (heart failure) ([place] 06/20/2018  . Osteoporosis 09/29/2017  . Loosening of hardware in spine ([place] 08/23/2017  . Spinal stenosis  of lumbar region 08/11/2016  . Unspecified cord compression ([place]) 08/11/2016  . Unstable burst fracture of third lumbar vertebra ([place]) 08/11/2016  . Genetic testing 03/17/2016  . Family history of breast cancer   . Breast cancer of upper-outer quadrant of left female breast ([place]) 02/02/2016  . On amiodarone therapy 09/09/2015  . On continuous oral anticoagulation 08/22/2015  . Persistent atrial fibrillation ([place])  08/22/2015  . Essential hypertension 11/07/2013  . Mitral regurgitation 11/07/2013    Past Surgical History:  Procedure Laterality Date  . BIOPSY  07/14/2020   Procedure: BIOPSY;  Surgeon: Ronnette Juniper, MD;  Location: [facility];  Service: Gastroenterology;;  . BREAST BIOPSY Left 01/28/2016  . BREAST LUMPECTOMY Left 02/23/2016  . BREAST LUMPECTOMY WITH RADIOACTIVE SEED LOCALIZATION Left 02/23/2016   Procedure: BREAST LUMPECTOMY WITH RADIOACTIVE SEED LOCALIZATION;  Surgeon: [name], MD;  Location: [place];  Service: General;  Laterality: Left;  . CARDIAC CATHETERIZATION    . CARDIOVERSION N/A 10/02/2015   Procedure: CARDIOVERSION;  Surgeon: Jerline Pain, MD;  Location: [facility] ENDOSCOPY;  Service: Cardiovascular;  Laterality: N/A;  . CARDIOVERSION N/A 10/05/2018   Procedure: CARDIOVERSION;  Surgeon: Fay Records, MD;  Location: [facility] ENDOSCOPY;  Service: Cardiovascular;  Laterality: N/A;  . CARDIOVERSION N/A 11/05/2019   Procedure: CARDIOVERSION;  Surgeon: Dorothy Spark, MD;  Location: [place];  Service: Cardiovascular;  Laterality: N/A;  . CLIPPING OF ATRIAL APPENDAGE  08/10/2018   Procedure: CLIPPING OF ATRIAL APPENDAGE;  Surgeon: Rexene Alberts, MD;  Location: [place];  Service: Open Heart Surgery;;  . COLONOSCOPY    . COLONOSCOPY WITH PROPOFOL N/A 07/14/2020   Procedure: COLONOSCOPY WITH PROPOFOL;  Surgeon: Ronnette Juniper, MD;  Location: [facility];  Service: Gastroenterology;  Laterality: N/A;  . CORONARY ARTERY BYPASS GRAFT N/A 08/10/2018   Procedure: CORONARY ARTERY BYPASS GRAFTING (CABG) x 1, LIMA-LAD,  USING LEFT INTERNAL MAMMARY ARTERY. HARVESTED RIGHT GREATER SAPHENOUS VEIN ENDOSCOPICALLY;  Surgeon: Rexene Alberts, MD;  Location: [place];  Service: Open Heart Surgery;  Laterality: N/A;  . CYSTOSCOPY W/ RETROGRADES Bilateral 06/20/2019   Procedure: CYSTOSCOPY WITH RETROGRADE PYELOGRAM;  Surgeon: Ardis Hughs, MD;  Location[facility];  Service: Urology;  Laterality: Bilateral;  . CYSTOSCOPY WITH HYDRODISTENSION AND BIOPSY N/A 06/20/2019   Procedure: CYSTOSCOPY BLADDER  BIOPSY WITH FULGERATION;  Surgeon: Ardis Hughs, MD;  Location: [facility];  Service: Urology;  Laterality: N/A;  . DUPUYTREN CONTRACTURE RELEASE  2001   leftx2  . [place]   right  . DUPUYTREN CONTRACTURE RELEASE Right 05/02/2014   Procedure: EXCISION DUPUYTRENS RIGHT PALMAR/SMALL ;  Surgeon: Cammie Sickle, MD;  Location: [place];  Service: Orthopedics;  Laterality: Right;  . HEMOSTASIS CLIP PLACEMENT  07/14/2020   Procedure: HEMOSTASIS CLIP PLACEMENT;  Surgeon: Ronnette Juniper, MD;  Location: [facility];  Service: Gastroenterology;;  . IR VERTEBROPLASTY CERV/THOR BX INC UNI/BIL INC/INJECT/IMAGING  08/19/2020  . MAZE N/A 08/10/2018   Procedure: MAZE;  Surgeon: Rexene Alberts, MD;  Location: [place];  Service: Open Heart Surgery;  Laterality: N/A;  . MITRAL VALVE REPAIR N/A 08/10/2018   Procedure: MITRAL VALVE REPAIR (MVR);  Surgeon: Rexene Alberts, MD;  Location: [place];  Service: Open Heart Surgery;  Laterality: N/A;  glutaraldehyde  . POLYPECTOMY  07/14/2020   Procedure: POLYPECTOMY;  Surgeon: Ronnette Juniper, MD;  Location: [facility];  Service: Gastroenterology;;  . RIGHT/LEFT HEART CATH AND CORONARY ANGIOGRAPHY  N/A 06/20/2018   Procedure: RIGHT/LEFT HEART CATH AND CORONARY ANGIOGRAPHY;  Surgeon: [name], MD;  Location: [place] CV LAB;  Service: Cardiovascular;  Laterality: N/A;  . TEE WITHOUT CARDIOVERSION N/A 06/20/2018   Procedure: TRANSESOPHAGEAL ECHOCARDIOGRAM (TEE);  Surgeon: Pixie Casino, MD;  Location: [facility] ENDOSCOPY;  Service: Cardiovascular;  Laterality: N/A;  . TEE WITHOUT CARDIOVERSION N/A 08/10/2018   Procedure: TRANSESOPHAGEAL ECHOCARDIOGRAM (TEE);  Surgeon: Rexene Alberts, MD;  Location: [place];  Service: Open Heart Surgery;  Laterality: N/A;  .  TONSILLECTOMY  age 89     OB History    Gravida  1   Para  1   Term  1   Preterm      AB      Living  1     SAB      TAB      Ectopic      Multiple      Live Births              Family History  Problem Relation Age of Onset  . Cirrhosis Mother   . Breast cancer Mother 31  . Heart failure Father   . Heart attack Father   . Breast cancer Sister        early 59's  . Kidney Stones Sister        loss of kidney due to stones  . Osteoporosis Neg Hx     Social History   Tobacco Use  . Smoking status: Former Smoker    Packs/day: 1.00    Years: 20.00    Pack years: 20.00    Types: Cigarettes    Quit date: 11/22/1992    Years since quitting: 27.8  . Smokeless tobacco: Never Used  Vaping Use  . Vaping Use: Never used  Substance Use Topics  . Alcohol use: Yes    Alcohol/week: 3.0 standard drinks    Types: 3 Standard drinks or equivalent per week    Comment: social  . Drug use: No    Home Medications Prior to Admission medications   Medication Sig Start Date End Date Taking? Authorizing Provider  acetaminophen (TYLENOL) 500 MG tablet Take 500-1,000 mg by mouth every 6 (six) hours as needed for moderate pain or headache.     [provider]  allopurinol (ZYLOPRIM) 100 MG tablet Take 200 mg by mouth every evening.  06/21/13   [provider]  atorvastatin (LIPITOR) 20 MG tablet TAKE 1 TABLET BY MOUTH EVERY DAY Patient taking differently: Take 20 mg by mouth daily.  04/07/20   Camnitz, Ocie Doyne, MD  calcium gluconate 500 MG tablet Take 1 tablet (500 mg total) by mouth 3 (three) times daily. 08/23/20   Debbe Odea, MD  digoxin (LANOXIN) 0.125 MG tablet Take one tablet by mouth daily Monday through Friday.  Do NOT take on Saturday or Sunday. 08/29/20   [name], MD  feeding supplement, ENSURE ENLIVE, (ENSURE ENLIVE) LIQD Take 237 mLs by mouth 2 (two) times daily between meals. 08/23/20   Debbe Odea, MD  furosemide (LASIX) 20 MG tablet  Take 20 mg by mouth daily as needed (for swelling/fluid retention.).     [provider]  mesalamine (APRISO) 0.375 g 24 hr capsule Take 1.5 g by mouth daily.  06/01/19   [provider]  metoprolol tartrate (LOPRESSOR) 100 MG tablet TAKE 1 TABLET BY MOUTH TWICE A DAY Patient taking differently: Take 100 mg by mouth 2 (two) times daily.  03/10/20   [name], MD  Multiple Vitamin (MULTIVITAMIN WITH MINERALS) TABS tablet Take 1 tablet by mouth daily. 08/24/20   Debbe Odea, MD  nitrofurantoin (MACRODANTIN) 50 MG capsule Take 50 mg by mouth at bedtime.    [provider]  oxyCODONE (OXY IR/ROXICODONE) 5 MG immediate release tablet Take 1 tablet (5 mg total) by mouth every 4 (four) hours as needed for moderate pain (Use for breakthrough moderate pain). 08/22/20   Debbe Odea, MD  potassium chloride SA (KLOR-CON M20) 20 MEQ tablet Take 1 tablet (20 mEq total) by mouth daily as needed. Take only when taking a Lasix 08/23/20   Debbe Odea, MD  predniSONE (DELTASONE) 10 MG tablet Take 20 mg by mouth daily. 08/11/20   [provider]  senna-docusate (SENOKOT-S) 8.6-50 MG tablet Take 2 tablets by mouth 2 (two) times daily. 08/23/20   Debbe Odea, MD  Vitamin D, Ergocalciferol, (DRISDOL) 1.25 MG (50000 UNIT) CAPS capsule Take 1 capsule (50,000 Units total) by mouth every 7 (seven) days. 08/23/20   Debbe Odea, MD  XARELTO 15 MG TABS tablet TAKE 1 TABLET BY MOUTH  DAILY WITH SUPPER Patient taking differently: Take 15 mg by mouth daily.  03/24/20   [name], MD    Allergies    Shellfish allergy  Review of Systems   Review of Systems Ten systems are reviewed and are negative for acute change except as noted in the HPI  Physical Exam Updated Vital Signs BP 119/77 (BP Location: Right Arm)   Pulse 74   Temp 97.6 F (36.4 C) (Oral)   Resp 16   Ht 5' 4"  (1.626 m)   Wt 68 kg   SpO2 94%   BMI 25.75 kg/m   Physical Exam Constitutional:      General: She  is not in acute distress.    Appearance: Normal appearance. She is well-developed. She is not ill-appearing or diaphoretic.  HENT:     Head: Normocephalic and atraumatic.  Eyes:     General: Vision grossly intact. Gaze aligned appropriately.     Pupils: Pupils are equal, round, and reactive to light.  Neck:     Trachea: Trachea and phonation normal.  Cardiovascular:     Pulses:          Dorsalis pedis pulses are 1+ on the right side and 1+ on the left side.  Pulmonary:     Effort: Pulmonary effort is normal. No respiratory distress.  Abdominal:     General: There is no distension.     Palpations: Abdomen is soft.     Tenderness: There is no abdominal tenderness. There is no guarding or rebound.  Musculoskeletal:        General: Normal range of motion.     Cervical back: Normal range of motion.     Right lower leg: 1+ Edema present.     Left lower leg: 1+ Edema present.     Comments: Diffuse lower thoracic spinal tenderness to palpation primarily right paraspinal muscles compared to midline or left.  No midline C/L spinal tenderness to palpation, no paraspinal muscle tenderness, no deformity, crepitus, or step-off noted.  Well-healed surgical scars.  Feet:     Right foot:     Protective Sensation: 3 sites tested. 3 sites sensed.     Left foot:     Protective Sensation: 3 sites tested. 3 sites sensed.  Skin:    General: Skin is warm and dry.  Neurological:  Mental Status: She is alert.     GCS: GCS eye subscore is 4. GCS verbal subscore is 5. GCS motor subscore is 6.     Comments: Speech is clear and goal oriented, follows commands Major Cranial nerves without deficit, no facial droop Moves extremities without ataxia, coordination intact  Psychiatric:        Behavior: Behavior normal.     ED Results / Procedures / Treatments   Labs (all labs ordered are listed, but only abnormal results are displayed) Labs Reviewed  CBC WITH DIFFERENTIAL/PLATELET - Abnormal; Notable for  the following components:      Result Value   WBC 11.6 (*)    RBC 3.65 (*)    MCV 105.5 (*)    Platelets 125 (*)    Neutro Abs 10.0 (*)    Abs Immature Granulocytes 0.14 (*)    All other components within normal limits  BASIC METABOLIC PANEL - Abnormal; Notable for the following components:   Glucose, Bld 148 (*)    BUN 25 (*)    All other components within normal limits  RESPIRATORY PANEL BY RT PCR (FLU A&B, COVID)    EKG EKG Interpretation  Date/Time:  Friday September 12 2020 09:29:58 EDT Ventricular Rate:  124 PR Interval:    QRS Duration: 86 QT Interval:  288 QTC Calculation: 413 R Axis:   70 Text Interpretation: Atrial fibrillation with rapid ventricular response Marked ST abnormality, possible inferior subendocardial injury Abnormal ECG similar to prior Confirmed by Davonna Belling [id number]) on 09/12/2020 9:46:29 AM   Radiology DG Chest 2 View  Result Date: 09/12/2020 CLINICAL DATA:  Back pain. Additional provided: Patient reports upper and lower back pain down center of spine, pain does not radiate. EXAM: CHEST - 2 VIEW COMPARISON:  Prior chest radiographs 08/16/2020 and earlier. FINDINGS: Prior median sternotomy/CABG. Prosthetic cardiac valve. Left atrial appendage clip. Unchanged cardiomegaly. Aortic atherosclerosis. There is no appreciable airspace consolidation or pulmonary edema. Minimal bibasilar atelectasis. No evidence of pleural effusion or pneumothorax. Severe acute/subacute T8 vertebral compression fracture as described on the concurrently performed thoracic spine radiographs. Severe chronic T7 compression fracture status post vertebral augmentation. IMPRESSION: Severe acute/subacute T8 vertebral compression fracture as described on the concurrently performed thoracic spine radiographs. Minimal bibasilar atelectasis. Unchanged cardiomegaly. Aortic Atherosclerosis (ICD10-I70.0). Electronically Signed   By: Kellie Simmering DO   On: 09/12/2020 11:05   DG Thoracic Spine 2  View  Result Date: 09/12/2020 CLINICAL DATA:  Back pain. Additional history provided: Patient reports upper and lower back pain down center of spine, pain non radiating. EXAM: THORACIC SPINE 2 VIEWS COMPARISON:  MRI thoracic spine 08/16/2020. FINDINGS: No significant spondylolisthesis. Exaggerated thoracic kyphosis centered at the T7-T8 level (at sites of severe compression fractures). Mild lower thoracic levocurvature Redemonstrated severe chronic T7 vertebral compression fracture status post vertebral augmentation. New as compared to the prior MRI of 08/16/2020, there is a severe T8 vertebral compression fracture. Mild multilevel disc space narrowing. IMPRESSION: Severe T8 vertebral compression fracture, new as compared to the thoracic spine MRI of 08/16/2020, acute/subacute. Redemonstrated severe chronic T7 vertebral compression fracture status post vertebral augmentation. Exaggerated thoracic kyphosis centered at the T7-T8 level. Mild lower thoracic levocurvature. Electronically Signed   By: Kellie Simmering DO   On: 09/12/2020 10:56   DG Lumbar Spine Complete  Result Date: 09/12/2020 CLINICAL DATA:  Back pain. Additional history provided: Patient reports upper and lower back pain down the center of the spine, nonradiating. EXAM: LUMBAR SPINE - COMPLETE 4+ VIEW  COMPARISON:  CT abdomen/pelvis 05/22/2020. FINDINGS: Five lumbar vertebrae. Lumbar dextrocurvature. Unchanged bony retropulsion at the L2 and L3 levels. Redemonstrated moderate chronic L2 vertebral compression fracture. Redemonstrated severe chronic L3 vertebral compression fracture. No acute vertebral compression deformity is identified. Multilevel disc space narrowing. Most notably, there is moderate/severe disc space narrowing at L2-L3. Mild multilevel facet arthrosis. Aortic atherosclerosis. IMPRESSION: No evidence of acute lumbar compression fracture. Redemonstrated chronic L2 and L3 vertebral compression fractures with associated bony  retropulsion. Lumbar spondylosis as described. Lumbar dextrocurvature. Aortic Atherosclerosis (ICD10-I70.0). Electronically Signed   By: Kellie Simmering DO   On: 09/12/2020 11:01    Procedures Procedures (including critical care time)  Medications Ordered in ED Medications  morphine 2 MG/ML injection 2 mg (2 mg Intravenous Given 09/12/20 0950)  morphine 4 MG/ML injection 4 mg (4 mg Intravenous Given 09/12/20 1052)  HYDROmorphone (DILAUDID) injection 0.5 mg (0.5 mg Intravenous Given 09/12/20 1139)  HYDROmorphone (DILAUDID) injection 0.5 mg (0.5 mg Intravenous Given 09/12/20 1349)    ED Course  I have reviewed the triage vital signs and the nursing notes.  Pertinent labs & imaging results that were available during my care of the patient were reviewed by me and considered in my medical decision making (see chart for details).  Clinical Course as of Sep 13 1447  Fri Sep 12, 2020  1343 Dr. Jacqualyn Posey   [BM]    Clinical Course User Index [BM] Gari Crown   MDM Rules/Calculators/A&P                         Additional history obtained from: 1. Nursing notes from this visit. 2. Family, son at bedside. -------------- 79 year old female with a T7 compression fracture that was repaired 3 weeks ago presents today for worsening pain.  She had a another compression fractures seen on outpatient x-ray, she has been taking Percocet at home without relief due to severity of pain she could not wait for her orthopedic office visit today and came to the ER for evaluation and pain control.  On initial evaluation she have tenderness of the thoracic spine and right paraspinal muscles, no evidence of infection well-healed surgical scars.  No neurologic complaints or symptoms suggestive of cauda equina.  She also has no abdominal tenderness, no chest pain or shortness of breath, NVI to all 4 extremities.  She was noted to be tachycardic on initial evaluation this is thought to be secondary to her acute  pain.  EKG was obtained and reviewed by Dr. Alvino Chapel showed no acute changes.  She was given morphine with improvement of her pain and resolution of tachycardia.  X-rays were obtained of the thoracic and lumbar spine shows the T8 compression fracture which is suspected be the cause of her pain today.  At 11:20 AM I spoke with Hilbert Odor, PA-C with orthopedics who is coming to evaluate patient. - Chest x-ray:  IMPRESSION:  Severe acute/subacute T8 vertebral compression fracture as described  on the concurrently performed thoracic spine radiographs.    Minimal bibasilar atelectasis.    Unchanged cardiomegaly.    Aortic Atherosclerosis (ICD10-I70.0).   DG Thoracic Spine:  IMPRESSION:  Severe T8 vertebral compression fracture, new as compared to the  thoracic spine MRI of 08/16/2020, acute/subacute.    Redemonstrated severe chronic T7 vertebral compression fracture  status post vertebral augmentation.    Exaggerated thoracic kyphosis centered at the T7-T8 level.    Mild lower thoracic levocurvature.   DG Lumbar Spine:  IMPRESSION:  No evidence of acute lumbar compression fracture.    Redemonstrated chronic L2 and L3 vertebral compression fractures  with associated bony retropulsion.    Lumbar spondylosis as described.    Lumbar dextrocurvature.    Aortic Atherosclerosis (ICD10-I70.0).  ----------- Discussed case with Orion Crook, PA-C orthopedics.  Advised interventional radiology consultation for repair. - 1:43 PM: Discussed case with IR Dr. Earleen Newport, advises they will see patient in consult for repair, hospitalist admission for pain control. - Patient reevaluated resting comfortably no acute distress states understanding of care plan is agreeable no further concerns or complaints, pain controlled following Dilaudid.  Basic labs CBC, BMP and Covid test ordered. - Case discussed with Dr. Marylyn Ishihara, patient accepted to hospitalist service.  I have reviewed and  interpreted the following labs.  CBC shows mild leukocytosis suspect this is from her pain low suspicion for infection at this time, no anemia.  BMP shows no emergent electrolyte derangement, AKI or gap.    Note: Portions of this report may have been transcribed using voice recognition software. Every effort was made to ensure accuracy; however, inadvertent computerized transcription errors may still be present. Final Clinical Impression(s) / ED Diagnoses Final diagnoses:  Compression fracture of T8 vertebra, initial encounter [facility])    Rx / DC Orders ED Discharge Orders    None       Gari Crown 09/12/20 1448    Davonna Belling, MD 09/12/20 1546

## 2020-09-12 NOTE — ED Notes (Signed)
Pt resting in bed. PA at bedside

## 2020-09-12 NOTE — ED Notes (Signed)
Pt resting in bed. Pain improving. Will continue to monitor.

## 2020-09-12 NOTE — ED Notes (Signed)
Pt resting in bed. NADN

## 2020-09-12 NOTE — ED Notes (Signed)
Pt assisted with female urinal

## 2020-09-12 NOTE — ED Notes (Signed)
Michele Green, son, [phone] would like an update when available

## 2020-09-12 NOTE — ED Notes (Signed)
Received patient in handoff. Pt reporting abating but still present upper back pain. Applied purewick external catheter for comfort. Will continue to monitor. AFVSS

## 2020-09-13 ENCOUNTER — Encounter (HOSPITAL_COMMUNITY): Payer: Self-pay | Admitting: Internal Medicine

## 2020-09-13 DIAGNOSIS — Z853 Personal history of malignant neoplasm of breast: Secondary | ICD-10-CM | POA: Diagnosis not present

## 2020-09-13 DIAGNOSIS — E785 Hyperlipidemia, unspecified: Secondary | ICD-10-CM | POA: Diagnosis present

## 2020-09-13 DIAGNOSIS — I5032 Chronic diastolic (congestive) heart failure: Secondary | ICD-10-CM | POA: Diagnosis present

## 2020-09-13 DIAGNOSIS — Z20822 Contact with and (suspected) exposure to covid-19: Secondary | ICD-10-CM | POA: Diagnosis present

## 2020-09-13 DIAGNOSIS — Z66 Do not resuscitate: Secondary | ICD-10-CM | POA: Diagnosis present

## 2020-09-13 DIAGNOSIS — S22069A Unspecified fracture of T7-T8 vertebra, initial encounter for closed fracture: Secondary | ICD-10-CM | POA: Diagnosis present

## 2020-09-13 DIAGNOSIS — Z923 Personal history of irradiation: Secondary | ICD-10-CM | POA: Diagnosis not present

## 2020-09-13 DIAGNOSIS — Z23 Encounter for immunization: Secondary | ICD-10-CM | POA: Diagnosis not present

## 2020-09-13 DIAGNOSIS — Z79899 Other long term (current) drug therapy: Secondary | ICD-10-CM | POA: Diagnosis not present

## 2020-09-13 DIAGNOSIS — S22060A Wedge compression fracture of T7-T8 vertebra, initial encounter for closed fracture: Secondary | ICD-10-CM | POA: Diagnosis present

## 2020-09-13 DIAGNOSIS — S22000A Wedge compression fracture of unspecified thoracic vertebra, initial encounter for closed fracture: Secondary | ICD-10-CM | POA: Diagnosis not present

## 2020-09-13 DIAGNOSIS — Z7952 Long term (current) use of systemic steroids: Secondary | ICD-10-CM | POA: Diagnosis not present

## 2020-09-13 DIAGNOSIS — M4854XA Collapsed vertebra, not elsewhere classified, thoracic region, initial encounter for fracture: Secondary | ICD-10-CM | POA: Diagnosis not present

## 2020-09-13 DIAGNOSIS — I110 Hypertensive heart disease with heart failure: Secondary | ICD-10-CM | POA: Diagnosis present

## 2020-09-13 DIAGNOSIS — Z91013 Allergy to seafood: Secondary | ICD-10-CM | POA: Diagnosis not present

## 2020-09-13 DIAGNOSIS — Z803 Family history of malignant neoplasm of breast: Secondary | ICD-10-CM | POA: Diagnosis not present

## 2020-09-13 DIAGNOSIS — K5190 Ulcerative colitis, unspecified, without complications: Secondary | ICD-10-CM | POA: Diagnosis present

## 2020-09-13 DIAGNOSIS — I484 Atypical atrial flutter: Secondary | ICD-10-CM | POA: Diagnosis present

## 2020-09-13 DIAGNOSIS — Z87891 Personal history of nicotine dependence: Secondary | ICD-10-CM | POA: Diagnosis not present

## 2020-09-13 DIAGNOSIS — D7589 Other specified diseases of blood and blood-forming organs: Secondary | ICD-10-CM | POA: Diagnosis present

## 2020-09-13 DIAGNOSIS — I2510 Atherosclerotic heart disease of native coronary artery without angina pectoris: Secondary | ICD-10-CM | POA: Diagnosis present

## 2020-09-13 DIAGNOSIS — I4819 Other persistent atrial fibrillation: Secondary | ICD-10-CM | POA: Diagnosis not present

## 2020-09-13 DIAGNOSIS — M8088XA Other osteoporosis with current pathological fracture, vertebra(e), initial encounter for fracture: Secondary | ICD-10-CM | POA: Diagnosis present

## 2020-09-13 DIAGNOSIS — Z8719 Personal history of other diseases of the digestive system: Secondary | ICD-10-CM | POA: Diagnosis not present

## 2020-09-13 DIAGNOSIS — I10 Essential (primary) hypertension: Secondary | ICD-10-CM | POA: Diagnosis not present

## 2020-09-13 DIAGNOSIS — Z8249 Family history of ischemic heart disease and other diseases of the circulatory system: Secondary | ICD-10-CM | POA: Diagnosis not present

## 2020-09-13 DIAGNOSIS — Z7901 Long term (current) use of anticoagulants: Secondary | ICD-10-CM | POA: Diagnosis not present

## 2020-09-13 DIAGNOSIS — I4821 Permanent atrial fibrillation: Secondary | ICD-10-CM | POA: Diagnosis present

## 2020-09-13 DIAGNOSIS — Z951 Presence of aortocoronary bypass graft: Secondary | ICD-10-CM | POA: Diagnosis not present

## 2020-09-13 LAB — COMPREHENSIVE METABOLIC PANEL WITH GFR
ALT: 29 U/L (ref 0–44)
AST: 26 U/L (ref 15–41)
Albumin: 2.9 g/dL — ABNORMAL LOW (ref 3.5–5.0)
Alkaline Phosphatase: 43 U/L (ref 38–126)
Anion gap: 10 (ref 5–15)
BUN: 21 mg/dL (ref 8–23)
CO2: 23 mmol/L (ref 22–32)
Calcium: 9.1 mg/dL (ref 8.9–10.3)
Chloride: 107 mmol/L (ref 98–111)
Creatinine, Ser: 0.65 mg/dL (ref 0.44–1.00)
GFR, Estimated: >60 mL/min
Glucose, Bld: 100 mg/dL — ABNORMAL HIGH (ref 70–99)
Potassium: 4 mmol/L (ref 3.5–5.1)
Sodium: 140 mmol/L (ref 135–145)
Total Bilirubin: 1 mg/dL (ref 0.3–1.2)
Total Protein: 5.4 g/dL — ABNORMAL LOW (ref 6.5–8.1)

## 2020-09-13 LAB — CBC
HCT: 37.1 % (ref 36.0–46.0)
Hemoglobin: 11.9 g/dL — ABNORMAL LOW (ref 12.0–15.0)
MCH: 33.9 pg (ref 26.0–34.0)
MCHC: 32.1 g/dL (ref 30.0–36.0)
MCV: 105.7 fL — ABNORMAL HIGH (ref 80.0–100.0)
Platelets: 128 10*3/uL — ABNORMAL LOW (ref 150–400)
RBC: 3.51 MIL/uL — ABNORMAL LOW (ref 3.87–5.11)
RDW: 14.3 % (ref 11.5–15.5)
WBC: 8.5 10*3/uL (ref 4.0–10.5)
nRBC: 0 % (ref 0.0–0.2)

## 2020-09-13 LAB — VITAMIN B12: Vitamin B-12: 789 pg/mL (ref 180–914)

## 2020-09-13 LAB — FOLATE: Folate: 31.1 ng/mL (ref >5.9)

## 2020-09-13 MED ORDER — ATORVASTATIN CALCIUM 10 MG PO TABS
20.0000 mg | ORAL_TABLET | Freq: Every day | ORAL | Status: DC
  Administered 2020-09-13 – 2020-09-19 (×7): 20 mg via ORAL
  Filled 2020-09-13 (×7): qty 2

## 2020-09-13 MED ORDER — HYDROMORPHONE HCL 1 MG/ML IJ SOLN
0.5000 mg | Freq: Once | INTRAMUSCULAR | Status: AC
  Administered 2020-09-13: 0.5 mg via INTRAVENOUS
  Filled 2020-09-13: qty 0.5

## 2020-09-13 MED ORDER — ALLOPURINOL 100 MG PO TABS
200.0000 mg | ORAL_TABLET | Freq: Every evening | ORAL | Status: DC
  Administered 2020-09-13 – 2020-09-19 (×7): 200 mg via ORAL
  Filled 2020-09-13 (×7): qty 2

## 2020-09-13 MED ORDER — INFLUENZA VAC A&B SA ADJ QUAD 0.5 ML IM PRSY
0.5000 mL | PREFILLED_SYRINGE | INTRAMUSCULAR | Status: DC
  Filled 2020-09-13: qty 0.5

## 2020-09-13 MED ORDER — MESALAMINE 400 MG PO CPDR
800.0000 mg | DELAYED_RELEASE_CAPSULE | Freq: Two times a day (BID) | ORAL | Status: DC
  Administered 2020-09-13 – 2020-09-14 (×2): 800 mg via ORAL
  Filled 2020-09-13 (×3): qty 2

## 2020-09-13 MED ORDER — ENOXAPARIN SODIUM 40 MG/0.4ML ~~LOC~~ SOLN
40.0000 mg | SUBCUTANEOUS | Status: AC
  Administered 2020-09-13 – 2020-09-14 (×2): 40 mg via SUBCUTANEOUS
  Filled 2020-09-13 (×2): qty 0.4

## 2020-09-13 NOTE — Progress Notes (Addendum)
ANTICOAGULATION CONSULT NOTE - Initial Consult  Pharmacy Consult for enoxaparin Indication: atrial fibrillation  Allergies  Allergen Reactions  . Shellfish Allergy Itching    Patient Measurements: Height: 5' 4"  (162.6 cm) Weight: 68 kg (150 lb) IBW/kg (Calculated) : 54.7  Vital Signs: Temp: 97.7 F (36.5 C) (10/23 0735) Temp Source: Oral (10/23 0735) BP: 127/77 (10/23 0735) Pulse Rate: 92 (10/23 0735)  Labs: Recent Labs    09/12/20 1350 09/13/20 0159  HGB 12.3 11.9*  HCT 38.5 37.1  PLT 125* 128*  CREATININE 0.81 0.65    Estimated Creatinine Clearance: 52.2 mL/min (by C-G formula based on SCr of 0.65 mg/dL).   Medical History: Past Medical History:  Diagnosis Date  . Arrhythmia   . Atypical atrial flutter ([place]) 08/14/2018   Post-operative  . Breast cancer of upper-outer quadrant of left female breast ([place]) 02/02/2016  . CAD (coronary artery disease) 06/20/2018   LHC 7/19: pLAD 65/90, oD1 90, mLCx 85, OM2 50, oRCA 30, EF 50-55 >> s/p CABG in 9/19 (L-LAD)  . Colon polyp 02/2012  . Dupuytren contracture    bilateral hands  . Family history of breast cancer   . Hyperlipidemia   . Hypertension   . Mitral regurgitation 11/07/2013   Echo 7/19: Mild LVH, EF 60-65, no RWMA, mod ot severe MR, massive LAE, PASP 33 // TEE 7/19:  Mild conc LVH, EF 60-65, no RWMA, severe MR with mild post leaflet prolapse, massive // s/p MV repair 07/2018  . On continuous oral anticoagulation 08/22/2015   Started on Xarelto 08/12/2015   . Osteopenia   . Persistent atrial fibrillation ([place]) 08/22/2015   Started late August or early September 2016 // s/p Maze procedure 07/2018  . Personal history of radiation therapy   . Radiation Therapy 04/21/16-05/19/16   left breast 47.72 Gy, boosted to 10 Gy  . 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 S/P Maze operation for atrial fibrillation 08/10/2018   Complete bilateral atrial lesion set using bipolar radiofrequency and cryothermy ablation with clipping  of LA appendage  . 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 Ulcerative colitis ([place]   . Wears glasses     Medications:  Scheduled:  . calcium citrate  200 mg of elemental calcium Oral TID  . [START ON 09/15/2020] digoxin  0.125 mg Oral Q MTWThF  . docusate sodium  100 mg Oral BID  . [START ON 09/14/2020] influenza vaccine adjuvanted  0.5 mL Intramuscular Tomorrow-1000  . metoprolol tartrate  100 mg Oral BID  . senna-docusate  2 tablet Oral BID  . Vitamin D (Ergocalciferol)  50,000 Units Oral Q7 days    Assessment: [id number] admitted on 09/12/20 from ALF for worsening back pain, found to have new T8 compression fracture. On Xarelto 15 mg daily for PAF, last dose taken 10/21. Vertebroplasty tentatively planned for Monday 09/15/20. Pharmacy consulted to dose enoxaparin.  Bridging with full-dose enoxaparin is not needed since only prophylactic anticoagulation for Afib is interrupted. Will place patient on prophylactic enoxaparin. Per IR note, will schedule last dose of enoxaparin on Sunday AM.  Renal function stable, SCr 0.65. Hgb 11.9, Plt 128 (chronically low in 120s).   Goal of Therapy:  Monitor platelets by anticoagulation protocol: Yes   Plan:  Enoxaparin 40 mg SQ once daily on 10/23 and 10/24 Will monitor CBC F/u surgery  plans and resuming Xarelto post-op  Berenice Bouton, PharmD PGY2 Pharmacy Resident Phone between 7 am - 3:30 pm: [phone]  Please check AMION for all [facility] phone numbers After 10:00 PM, call Welcome [phone]  09/13/2020,8:34 AM

## 2020-09-13 NOTE — Progress Notes (Signed)
Pain management has not been effective- client states pain has been a 10 all day that with the dilaudid being given on three hour scehedule-possibly being reflected in her BP being soft-follow up being done for last recorded BP- notify on call phys. revaluate pain management

## 2020-09-13 NOTE — Progress Notes (Signed)
PROGRESS NOTE    Michele Green  OVA:[id number] DOB: 05/17/1941 DOA: 09/12/2020 PCP: [name], MD   Brief Narrative:  HPI: Michele Green is a 79 y.o. female with medical history significant of a fib, CAD s/p CABG, breast cancer. Presenting with back pain. She reports that she was at rehab for recurrent T7 compression fracture when she noticed worsening back pain in the same region. This was a couple weeks ago. It has worsened since that time. Her normal pain medicines have not been able to provide relief. She was supposed to go to orthopedics for a follow up today, but she was unable to d/t pain. She came to the ED instead. She denies any other aggravating or alleviating factors. She denies any other treatments.    ED Course: XR was obtain and showed new T8 compression fracture. ED called ortho who recommended IR consult. ED called IR. They requested CT of the thoracic spine to see if she was appropriate for kyphoplasty. TRH was called for admission.   Assessment & Plan:   Principal Problem:   Compression fracture of thoracic spine, non-traumatic (HCC) Active Problems:   Essential hypertension   On continuous oral anticoagulation   Persistent atrial fibrillation (HCC)   Chronic diastolic HF (heart failure) (HCC)   T8 compression fracture: Evaluated by IR.  Plan for T8 vertebroplasty on 09/15/2020.  Vitamin D deficiency     - continue vit D and Ca2+  Macrocytosis: B12 and folate normal.  Permanent A fib: Controlled     - continue digoxin, will hold Xarelto per IR recommendation and place her on Lovenox in the meantime. Please stop Lovenox after Sunday morning's dose per IR recommendation.  Chronic diastolic HF BLE edema     - continue lasix and TED hose.  HLD     - continue lipitor  Hx of Ulcerative Colitis     - continue home mesalamine, steroids when dosing confirmed  DVT prophylaxis: Place TED hose Start: 09/12/20 1728   Code Status: DNR  Family  Communication: None present at bedside.  Plan of care discussed with patient in length and he verbalized understanding and agreed with it.  Status is: Observation  The patient will require care spanning > 2 midnights and should be moved to inpatient because: Ongoing active pain requiring inpatient pain management  Dispo: The patient is from: Home              Anticipated d/c is to: SNF              Anticipated d/c date is: 3 days              Patient currently is not medically stable to d/c.        Estimated body mass index is 25.75 kg/m as calculated from the following:   Height as of this encounter: 5' 4"  (1.626 m).   Weight as of this encounter: 68 kg.      Nutritional status:               Consultants:   IR and orthopedics  Procedures:   None  Antimicrobials:  Anti-infectives (From admission, onward)   None         Subjective: Seen and examined. Continues to complain of low back pain, no different than yesterday. No other complaint. Any sort of movement hurts her.  Objective: Vitals:   09/12/20 2300 09/13/20 0021 09/13/20 0337 09/13/20 0735  BP: 123/90 130/87 102/71 127/77  Pulse: (!) 104  100 96 92  Resp: 10 18 17 18   Temp:  97.9 F (36.6 C) 97.7 F (36.5 C) 97.7 F (36.5 C)  TempSrc:  Oral Oral Oral  SpO2: 97% 100% 100% 95%  Weight:      Height:       No intake or output data in the 24 hours ending 09/13/20 0809 Filed Weights   09/12/20 1054  Weight: 68 kg    Examination:  General exam: Appears calm and comfortable  Respiratory system: Clear to auscultation. Respiratory effort normal. Cardiovascular system: S1 & S2 heard, RRR. No JVD, murmurs, rubs, gallops or clicks. No pedal edema. Gastrointestinal system: Abdomen is nondistended, soft and nontender. No organomegaly or masses felt. Normal bowel sounds heard. Central nervous system: Alert and oriented. No focal neurological deficits. Extremities: Symmetric 5 x 5 power. Low  midline back tenderness.  Skin: No rashes, lesions or ulcers Psychiatry: Judgement and insight appear normal. Mood & affect appropriate.    Data Reviewed: I have personally reviewed following labs and imaging studies  CBC: Recent Labs  Lab 09/12/20 1350 09/13/20 0159  WBC 11.6* 8.5  NEUTROABS 10.0*  --   HGB 12.3 11.9*  HCT 38.5 37.1  MCV 105.5* 105.7*  PLT 125* 101*   Basic Metabolic Panel: Recent Labs  Lab 09/12/20 1350 09/13/20 0159  NA 141 140  K 3.9 4.0  CL 108 107  CO2 24 23  GLUCOSE 148* 100*  BUN 25* 21  CREATININE 0.81 0.65  CALCIUM 9.1 9.1   GFR: Estimated Creatinine Clearance: 52.2 mL/min (by C-G formula based on SCr of 0.65 mg/dL). Liver Function Tests: Recent Labs  Lab 09/13/20 0159  AST 26  ALT 29  ALKPHOS 43  BILITOT 1.0  PROT 5.4*  ALBUMIN 2.9*   No results for input(s): LIPASE, AMYLASE in the last 168 hours. No results for input(s): AMMONIA in the last 168 hours. Coagulation Profile: No results for input(s): INR, PROTIME in the last 168 hours. Cardiac Enzymes: No results for input(s): CKTOTAL, CKMB, CKMBINDEX, TROPONINI in the last 168 hours. BNP (last 3 results) No results for input(s): PROBNP in the last 8760 hours. HbA1C: No results for input(s): HGBA1C in the last 72 hours. CBG: No results for input(s): GLUCAP in the last 168 hours. Lipid Profile: No results for input(s): CHOL, HDL, LDLCALC, TRIG, CHOLHDL, LDLDIRECT in the last 72 hours. Thyroid Function Tests: No results for input(s): TSH, T4TOTAL, FREET4, T3FREE, THYROIDAB in the last 72 hours. Anemia Panel: Recent Labs    09/13/20 0159  VITAMINB12 789  FOLATE 31.1   Sepsis Labs: No results for input(s): PROCALCITON, LATICACIDVEN in the last 168 hours.  Recent Results (from the past 240 hour(s))  Respiratory Panel by RT PCR (Flu A&B, Covid) - Nasopharyngeal Swab     Status: None   Collection Time: 09/12/20  1:52 PM   Specimen: Nasopharyngeal Swab  Result Value Ref  Range Status   SARS Coronavirus 2 by RT PCR NEGATIVE NEGATIVE Final    Comment: (NOTE) SARS-CoV-2 target nucleic acids are NOT DETECTED.  The SARS-CoV-2 RNA is generally detectable in upper respiratoy specimens during the acute phase of infection. The lowest concentration of SARS-CoV-2 viral copies this assay can detect is 131 copies/mL. A negative result does not preclude SARS-Cov-2 infection and should not be used as the sole basis for treatment or other patient management decisions. A negative result may occur with  improper specimen collection/handling, submission of specimen other than nasopharyngeal swab, presence of viral mutation(s) within  the areas targeted by this assay, and inadequate number of viral copies (<131 copies/mL). A negative result must be combined with clinical observations, patient history, and epidemiological information. The expected result is Negative.  Fact Sheet for Patients:  PinkCheek.be  Fact Sheet for Healthcare Providers:  GravelBags.it  This test is no t yet approved or cleared by the [place] FDA and  has been authorized for detection and/or diagnosis of SARS-CoV-2 by FDA under an Emergency Use Authorization (EUA). This EUA will remain  in effect (meaning this test can be used) for the duration of the COVID-19 declaration under Section 564(b)(1) of the Act, 21 U.S.C. section 360bbb-3(b)(1), unless the authorization is terminated or revoked sooner.     Influenza A by PCR NEGATIVE NEGATIVE Final   Influenza B by PCR NEGATIVE NEGATIVE Final    Comment: (NOTE) The Xpert Xpress SARS-CoV-2/FLU/RSV assay is intended as an aid in  the diagnosis of influenza from Nasopharyngeal swab specimens and  should not be used as a sole basis for treatment. Nasal washings and  aspirates are unacceptable for Xpert Xpress SARS-CoV-2/FLU/RSV  testing.  Fact Sheet for  Patients: PinkCheek.be  Fact Sheet for Healthcare Providers: GravelBags.it  This test is not yet approved or cleared by the [place] FDA and  has been authorized for detection and/or diagnosis of SARS-CoV-2 by  FDA under an Emergency Use Authorization (EUA). This EUA will remain  in effect (meaning this test can be used) for the duration of the  Covid-19 declaration under Section 564(b)(1) of the Act, 21  U.S.C. section 360bbb-3(b)(1), unless the authorization is  terminated or revoked. Performed at [facility], [place] [address] [place], [place] [address]       Radiology Studies: DG Chest 2 View  Result Date: 09/12/2020 CLINICAL DATA:  Back pain. Additional provided: Patient reports upper and lower back pain down center of spine, pain does not radiate. EXAM: CHEST - 2 VIEW COMPARISON:  Prior chest radiographs 08/16/2020 and earlier. FINDINGS: Prior median sternotomy/CABG. Prosthetic cardiac valve. Left atrial appendage clip. Unchanged cardiomegaly. Aortic atherosclerosis. There is no appreciable airspace consolidation or pulmonary edema. Minimal bibasilar atelectasis. No evidence of pleural effusion or pneumothorax. Severe acute/subacute T8 vertebral compression fracture as described on the concurrently performed thoracic spine radiographs. Severe chronic T7 compression fracture status post vertebral augmentation. IMPRESSION: Severe acute/subacute T8 vertebral compression fracture as described on the concurrently performed thoracic spine radiographs. Minimal bibasilar atelectasis. Unchanged cardiomegaly. Aortic Atherosclerosis (ICD10-I70.0). Electronically Signed   By: Kellie Simmering DO   On: 09/12/2020 11:05   DG Thoracic Spine 2 View  Result Date: 09/12/2020 CLINICAL DATA:  Back pain. Additional history provided: Patient reports upper and lower back pain down center of spine, pain non radiating. EXAM: THORACIC SPINE 2  VIEWS COMPARISON:  MRI thoracic spine 08/16/2020. FINDINGS: No significant spondylolisthesis. Exaggerated thoracic kyphosis centered at the T7-T8 level (at sites of severe compression fractures). Mild lower thoracic levocurvature Redemonstrated severe chronic T7 vertebral compression fracture status post vertebral augmentation. New as compared to the prior MRI of 08/16/2020, there is a severe T8 vertebral compression fracture. Mild multilevel disc space narrowing. IMPRESSION: Severe T8 vertebral compression fracture, new as compared to the thoracic spine MRI of 08/16/2020, acute/subacute. Redemonstrated severe chronic T7 vertebral compression fracture status post vertebral augmentation. Exaggerated thoracic kyphosis centered at the T7-T8 level. Mild lower thoracic levocurvature. Electronically Signed   By: Kellie Simmering DO   On: 09/12/2020 10:56   DG Lumbar Spine Complete  Result  Date: 09/12/2020 CLINICAL DATA:  Back pain. Additional history provided: Patient reports upper and lower back pain down the center of the spine, nonradiating. EXAM: LUMBAR SPINE - COMPLETE 4+ VIEW COMPARISON:  CT abdomen/pelvis 05/22/2020. FINDINGS: Five lumbar vertebrae. Lumbar dextrocurvature. Unchanged bony retropulsion at the L2 and L3 levels. Redemonstrated moderate chronic L2 vertebral compression fracture. Redemonstrated severe chronic L3 vertebral compression fracture. No acute vertebral compression deformity is identified. Multilevel disc space narrowing. Most notably, there is moderate/severe disc space narrowing at L2-L3. Mild multilevel facet arthrosis. Aortic atherosclerosis. IMPRESSION: No evidence of acute lumbar compression fracture. Redemonstrated chronic L2 and L3 vertebral compression fractures with associated bony retropulsion. Lumbar spondylosis as described. Lumbar dextrocurvature. Aortic Atherosclerosis (ICD10-I70.0). Electronically Signed   By: Kellie Simmering DO   On: 09/12/2020 11:01   CT Thoracic Spine Wo  Contrast  Result Date: 09/12/2020 CLINICAL DATA:  Thoracic compression fracture.  Back pain. EXAM: CT THORACIC SPINE WITHOUT CONTRAST TECHNIQUE: Multidetector CT images of the thoracic were obtained using the standard protocol without intravenous contrast. COMPARISON:  MRI thoracic spine 08/16/2020 FINDINGS: Alignment: Normal Vertebrae: Severe compression fracture of T7 vertebral body with vertebroplasty cement. Cement in satisfactory position. No spinal stenosis Moderate compression fracture of T8 is new since the prior MRI. There is retropulsion of posterior cortex into the canal without significant stenosis. No other fracture or mass lesion identified. 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 Coronary artery calcification. Atherosclerotic calcification aortic arch. 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 Disc levels: Disc spaces maintained in the thoracic spine without significant degenerative change. IMPRESSION: Acute fracture of T8, new since the recent MRI of 08/16/2020. Retropulsion of the posterior cortex into the spinal canal without significant spinal stenosis. Satisfactory cement vertebroplasty at T7 for severe fracture. Electronically Signed   By: Franchot Gallo M.D.   On: 09/12/2020 15:40    Scheduled Meds: . calcium citrate  200 mg of elemental calcium Oral TID  . [START ON 09/15/2020] digoxin  0.125 mg Oral Q MTWThF  . docusate sodium  100 mg Oral BID  . [START ON 09/14/2020] influenza vaccine adjuvanted  0.5 mL Intramuscular Tomorrow-1000  . metoprolol tartrate  100 mg Oral BID  . Rivaroxaban  15 mg Oral Q supper  . senna-docusate  2 tablet Oral BID  . Vitamin D (Ergocalciferol)  50,000 Units Oral Q7 days   Continuous Infusions:   LOS: 0 days   Time spent: 32 minutes   Darliss Cheney, MD [facility]  09/13/2020, 8:09 AM   To contact the attending provider between 7A-7P or the covering provider during after hours 7P-7A,  please log into the web site [email or web address].

## 2020-09-13 NOTE — Plan of Care (Signed)

## 2020-09-14 ENCOUNTER — Encounter (HOSPITAL_COMMUNITY): Payer: Self-pay | Admitting: Family Medicine

## 2020-09-14 DIAGNOSIS — M4854XA Collapsed vertebra, not elsewhere classified, thoracic region, initial encounter for fracture: Secondary | ICD-10-CM | POA: Diagnosis not present

## 2020-09-14 LAB — DIGOXIN LEVEL: Digoxin Level: 0.5 ng/mL — ABNORMAL LOW (ref 1.0–2.0)

## 2020-09-14 MED ORDER — MESALAMINE 400 MG PO CPDR
800.0000 mg | DELAYED_RELEASE_CAPSULE | Freq: Two times a day (BID) | ORAL | Status: DC
  Administered 2020-09-14 – 2020-09-19 (×10): 800 mg via ORAL
  Filled 2020-09-14 (×11): qty 2

## 2020-09-14 MED ORDER — HYDROMORPHONE HCL 1 MG/ML IJ SOLN
1.0000 mg | INTRAMUSCULAR | Status: DC | PRN
  Administered 2020-09-14 – 2020-09-19 (×12): 1 mg via INTRAVENOUS
  Filled 2020-09-14 (×13): qty 1

## 2020-09-14 MED ORDER — METOPROLOL TARTRATE 5 MG/5ML IV SOLN
5.0000 mg | INTRAVENOUS | Status: AC | PRN
  Administered 2020-09-14 – 2020-09-15 (×2): 5 mg via INTRAVENOUS
  Filled 2020-09-14 (×2): qty 5

## 2020-09-14 MED ORDER — MESALAMINE ER 0.375 G PO CP24: 1.5000 g | ORAL_CAPSULE | Freq: Every day | ORAL | Status: DC

## 2020-09-14 MED ORDER — HYDROCODONE-ACETAMINOPHEN 5-325 MG PO TABS
1.0000 | ORAL_TABLET | ORAL | Status: DC | PRN
  Administered 2020-09-14 – 2020-09-17 (×10): 1 via ORAL
  Filled 2020-09-14 (×10): qty 1

## 2020-09-14 MED ORDER — TRAMADOL HCL 50 MG PO TABS
50.0000 mg | ORAL_TABLET | Freq: Four times a day (QID) | ORAL | Status: DC | PRN
  Administered 2020-09-14 – 2020-09-15 (×3): 50 mg via ORAL
  Filled 2020-09-14 (×3): qty 1

## 2020-09-14 MED ORDER — TAMOXIFEN CITRATE 10 MG PO TABS
20.0000 mg | ORAL_TABLET | Freq: Every day | ORAL | Status: DC
  Filled 2020-09-14: qty 2

## 2020-09-14 MED ORDER — SODIUM CHLORIDE 0.9 % IV SOLN: INTRAVENOUS | Status: DC

## 2020-09-14 MED ORDER — PREDNISONE 20 MG PO TABS
20.0000 mg | ORAL_TABLET | Freq: Every day | ORAL | Status: DC
  Administered 2020-09-14 – 2020-09-19 (×6): 20 mg via ORAL
  Filled 2020-09-14 (×6): qty 1

## 2020-09-14 NOTE — Progress Notes (Signed)
PROGRESS NOTE    TEREZA GILHAM  AQT:[id number] DOB: December 09, 1940 DOA: 09/12/2020 PCP: [name], MD     Brief Narrative:  Thi Klich Scottis a 79 y.o.femalewith medical history significant ofa fib, CAD s/p CABG, breast cancer. Presenting with back pain. She reports that she was at rehab for recurrent T7 compression fracture when she noticed worsening back pain in the same region. This was a couple weeks ago. It has worsened since that time. Her normal pain medicines have not been able to provide relief. She was supposed to go to orthopedics for a follow up today, but she was unable to due to pain. She came to the ED instead. She denies any other aggravating or alleviating factors. She denies any other treatments.XR was obtain and showed new T8 compression fracture, IR consulted for kyphoplasty.   New events last 24 hours / Subjective: Continues to have severe pain in her back.  Assessment & Plan:   Principal Problem:   Compression fracture of thoracic spine, non-traumatic (HCC) Active Problems:   Essential hypertension   On continuous oral anticoagulation   Persistent atrial fibrillation (HCC)   Chronic diastolic HF (heart failure) (HCC)   T8 vertebral fracture (HCC)   T8 compression fracture -Planning for kyphoplasty 10/25 by IR -Continue pain control -Will need PT OT  A. fib RVR -Patient has history of permanent atrial fibrillation.  On Xarelto at baseline which is on hold pending her procedure tomorrow -Patient's heart rate elevated this afternoon, EKG obtained which was independently reviewed on 10/24 reveals atrial fibrillation with RVR, heart rate 140s -Patient is on digoxin and metoprolol, continue  Chronic diastolic heart failure -Without acute exacerbation at this time  Hyperlipidemia -Continue Lipitor   Ulcerative colitis -Continue mesalamine, prednisone.  On Humira     DVT prophylaxis: Xarelto on hold due to planned procedure tomorrow Place TED  hose Start: 09/12/20 1728  Code Status: DNR Family Communication: No family at bedside Disposition Plan:  Status is: Inpatient  Remains inpatient appropriate because:Ongoing active pain requiring inpatient pain management and IV treatments appropriate due to intensity of illness or inability to take PO   Dispo: The patient is from: ALF              Anticipated d/c is to: ALF              Anticipated d/c date is: 2 days              Patient currently is not medically stable to d/c.  Planning for kyphoplasty with IR 10/25.      Consultants:   IR  Procedures:   None  Antimicrobials:  Anti-infectives (From admission, onward)   None        Objective: Vitals:   09/13/20 2239 09/14/20 0442 09/14/20 0741 09/14/20 1050  BP: 120/70 106/74 90/63 131/71  Pulse: 76 (!) 109 87 (!) 109  Resp:  15 16   Temp:  98.6 F (37 C) 97.8 F (36.6 C) 97.8 F (36.6 C)  TempSrc:  Oral Oral Oral  SpO2: 100% 96% (!) 88% 93%  Weight:      Height:        Intake/Output Summary (Last 24 hours) at 09/14/2020 1215 Last data filed at 09/13/2020 1500 Gross per 24 hour  Intake 200 ml  Output --  Net 200 ml   Filed Weights   09/12/20 1054  Weight: 68 kg    Examination:  General exam: Appears calm and uncomfortable  Respiratory system: Clear  to auscultation. Respiratory effort normal. No respiratory distress. No conversational dyspnea.  Cardiovascular system: S1 & S2 heard, irregular rhythm. No murmurs. No pedal edema. Gastrointestinal system: Abdomen is nondistended, soft and nontender. Normal bowel sounds heard. Central nervous system: Alert and oriented. No focal neurological deficits. Speech clear.  Extremities: Symmetric in appearance  Skin: No rashes, lesions or ulcers on exposed skin  Psychiatry: Judgement and insight appear normal. Mood & affect appropriate.   Data Reviewed: I have personally reviewed following labs and imaging studies  CBC: Recent Labs  Lab 09/12/20 1350  09/13/20 0159  WBC 11.6* 8.5  NEUTROABS 10.0*  --   HGB 12.3 11.9*  HCT 38.5 37.1  MCV 105.5* 105.7*  PLT 125* 767*   Basic Metabolic Panel: Recent Labs  Lab 09/12/20 1350 09/13/20 0159  NA 141 140  K 3.9 4.0  CL 108 107  CO2 24 23  GLUCOSE 148* 100*  BUN 25* 21  CREATININE 0.81 0.65  CALCIUM 9.1 9.1   GFR: Estimated Creatinine Clearance: 52.2 mL/min (by C-G formula based on SCr of 0.65 mg/dL). Liver Function Tests: Recent Labs  Lab 09/13/20 0159  AST 26  ALT 29  ALKPHOS 43  BILITOT 1.0  PROT 5.4*  ALBUMIN 2.9*   No results for input(s): LIPASE, AMYLASE in the last 168 hours. No results for input(s): AMMONIA in the last 168 hours. Coagulation Profile: No results for input(s): INR, PROTIME in the last 168 hours. Cardiac Enzymes: No results for input(s): CKTOTAL, CKMB, CKMBINDEX, TROPONINI in the last 168 hours. BNP (last 3 results) No results for input(s): PROBNP in the last 8760 hours. HbA1C: No results for input(s): HGBA1C in the last 72 hours. CBG: No results for input(s): GLUCAP in the last 168 hours. Lipid Profile: No results for input(s): CHOL, HDL, LDLCALC, TRIG, CHOLHDL, LDLDIRECT in the last 72 hours. Thyroid Function Tests: No results for input(s): TSH, T4TOTAL, FREET4, T3FREE, THYROIDAB in the last 72 hours. Anemia Panel: Recent Labs    09/13/20 0159  VITAMINB12 789  FOLATE 31.1   Sepsis Labs: No results for input(s): PROCALCITON, LATICACIDVEN in the last 168 hours.  Recent Results (from the past 240 hour(s))  Respiratory Panel by RT PCR (Flu A&B, Covid) - Nasopharyngeal Swab     Status: None   Collection Time: 09/12/20  1:52 PM   Specimen: Nasopharyngeal Swab  Result Value Ref Range Status   SARS Coronavirus 2 by RT PCR NEGATIVE NEGATIVE Final    Comment: (NOTE) SARS-CoV-2 target nucleic acids are NOT DETECTED.  The SARS-CoV-2 RNA is generally detectable in upper respiratoy specimens during the acute phase of infection. The  lowest concentration of SARS-CoV-2 viral copies this assay can detect is 131 copies/mL. A negative result does not preclude SARS-Cov-2 infection and should not be used as the sole basis for treatment or other patient management decisions. A negative result may occur with  improper specimen collection/handling, submission of specimen other than nasopharyngeal swab, presence of viral mutation(s) within the areas targeted by this assay, and inadequate number of viral copies (<131 copies/mL). A negative result must be combined with clinical observations, patient history, and epidemiological information. The expected result is Negative.  Fact Sheet for Patients:  PinkCheek.be  Fact Sheet for Healthcare Providers:  GravelBags.it  This test is no t yet approved or cleared by the [place] FDA and  has been authorized for detection and/or diagnosis of SARS-CoV-2 by FDA under an Emergency Use Authorization (EUA). This EUA will remain  in effect (  meaning this test can be used) for the duration of the COVID-19 declaration under Section 564(b)(1) of the Act, 21 U.S.C. section 360bbb-3(b)(1), unless the authorization is terminated or revoked sooner.     Influenza A by PCR NEGATIVE NEGATIVE Final   Influenza B by PCR NEGATIVE NEGATIVE Final    Comment: (NOTE) The Xpert Xpress SARS-CoV-2/FLU/RSV assay is intended as an aid in  the diagnosis of influenza from Nasopharyngeal swab specimens and  should not be used as a sole basis for treatment. Nasal washings and  aspirates are unacceptable for Xpert Xpress SARS-CoV-2/FLU/RSV  testing.  Fact Sheet for Patients: PinkCheek.be  Fact Sheet for Healthcare Providers: GravelBags.it  This test is not yet approved or cleared by the [place] FDA and  has been authorized for detection and/or diagnosis of SARS-CoV-2 by  FDA under  an Emergency Use Authorization (EUA). This EUA will remain  in effect (meaning this test can be used) for the duration of the  Covid-19 declaration under Section 564(b)(1) of the Act, 21  U.S.C. section 360bbb-3(b)(1), unless the authorization is  terminated or revoked. Performed at [facility], [place] [address], [place], [place] [address]       Radiology Studies: CT Thoracic Spine Wo Contrast  Result Date: 09/12/2020 CLINICAL DATA:  Thoracic compression fracture.  Back pain. EXAM: CT THORACIC SPINE WITHOUT CONTRAST TECHNIQUE: Multidetector CT images of the thoracic were obtained using the standard protocol without intravenous contrast. COMPARISON:  MRI thoracic spine 08/16/2020 FINDINGS: Alignment: Normal Vertebrae: Severe compression fracture of T7 vertebral body with vertebroplasty cement. Cement in satisfactory position. No spinal stenosis Moderate compression fracture of T8 is new since the prior MRI. There is retropulsion of posterior cortex into the canal without significant stenosis. No other fracture or mass lesion identified. 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 Coronary artery calcification. Atherosclerotic calcification aortic arch. 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 Disc levels: Disc spaces maintained in the thoracic spine without significant degenerative change. IMPRESSION: Acute fracture of T8, new since the recent MRI of 08/16/2020. Retropulsion of the posterior cortex into the spinal canal without significant spinal stenosis. Satisfactory cement vertebroplasty at T7 for severe fracture. Electronically Signed   By: Franchot Gallo M.D.   On: 09/12/2020 15:40      Scheduled Meds: . allopurinol  200 mg Oral QPM  . atorvastatin  20 mg Oral q1800  . calcium citrate  200 mg of elemental calcium Oral TID  . [START ON 09/15/2020] digoxin  0.125 mg Oral Q MTWThF  . docusate sodium  100 mg Oral BID  . influenza vaccine  adjuvanted  0.5 mL Intramuscular Tomorrow-1000  . Mesalamine  800 mg Oral BID  . metoprolol tartrate  100 mg Oral BID  . senna-docusate  2 tablet Oral BID  . Vitamin D (Ergocalciferol)  50,000 Units Oral Q7 days   Continuous Infusions:   LOS: 1 day      Time spent: 35 minutes   Dessa Phi, DO [facility] 09/14/2020, 12:15 PM   Available via Epic secure chat 7am-7pm After these hours, please refer to coverage provider listed on [email or web address]

## 2020-09-15 ENCOUNTER — Inpatient Hospital Stay (HOSPITAL_COMMUNITY): Payer: Medicare Other

## 2020-09-15 ENCOUNTER — Other Ambulatory Visit: Payer: Self-pay

## 2020-09-15 DIAGNOSIS — M4854XA Collapsed vertebra, not elsewhere classified, thoracic region, initial encounter for fracture: Secondary | ICD-10-CM | POA: Diagnosis not present

## 2020-09-15 HISTORY — PX: IR VERTEBROPLASTY CERV/THOR BX INC UNI/BIL INC/INJECT/IMAGING: IMG5515

## 2020-09-15 LAB — BASIC METABOLIC PANEL
Anion gap: 11 (ref 5–15)
BUN: 12 mg/dL (ref 8–23)
CO2: 19 mmol/L — ABNORMAL LOW (ref 22–32)
Calcium: 9.2 mg/dL (ref 8.9–10.3)
Chloride: 109 mmol/L (ref 98–111)
Creatinine, Ser: 0.65 mg/dL (ref 0.44–1.00)
GFR, Estimated: >60 mL/min (ref >60)
Glucose, Bld: 120 mg/dL — ABNORMAL HIGH (ref 70–99)
Potassium: 4.2 mmol/L (ref 3.5–5.1)
Sodium: 139 mmol/L (ref 135–145)

## 2020-09-15 LAB — CBC
HCT: 36.5 % (ref 36.0–46.0)
Hemoglobin: 11.8 g/dL — ABNORMAL LOW (ref 12.0–15.0)
MCH: 33.9 pg (ref 26.0–34.0)
MCHC: 32.3 g/dL (ref 30.0–36.0)
MCV: 104.9 fL — ABNORMAL HIGH (ref 80.0–100.0)
Platelets: 116 10*3/uL — ABNORMAL LOW (ref 150–400)
RBC: 3.48 MIL/uL — ABNORMAL LOW (ref 3.87–5.11)
RDW: 13.8 % (ref 11.5–15.5)
WBC: 6.8 10*3/uL (ref 4.0–10.5)
nRBC: 0 % (ref 0.0–0.2)

## 2020-09-15 LAB — PROTIME-INR
INR: 1.1 (ref 0.8–1.2)
Prothrombin Time: 13.4 seconds (ref 11.4–15.2)

## 2020-09-15 MED ORDER — GELATIN ABSORBABLE 12-7 MM EX MISC
CUTANEOUS | Status: AC
  Filled 2020-09-15: qty 1

## 2020-09-15 MED ORDER — FENTANYL CITRATE (PF) 100 MCG/2ML IJ SOLN
INTRAMUSCULAR | Status: AC | PRN
Start: 2020-09-15 — End: 2020-09-15
  Administered 2020-09-15: 25 ug via INTRAVENOUS

## 2020-09-15 MED ORDER — METOPROLOL TARTRATE 5 MG/5ML IV SOLN
5.0000 mg | INTRAVENOUS | Status: AC | PRN
  Administered 2020-09-15 – 2020-09-16 (×2): 5 mg via INTRAVENOUS
  Filled 2020-09-15 (×2): qty 5

## 2020-09-15 MED ORDER — MIDAZOLAM HCL 2 MG/2ML IJ SOLN
INTRAMUSCULAR | Status: AC | PRN
  Administered 2020-09-15: 1 mg via INTRAVENOUS

## 2020-09-15 MED ORDER — CEFAZOLIN SODIUM-DEXTROSE 2-4 GM/100ML-% IV SOLN
INTRAVENOUS | Status: AC
  Administered 2020-09-15: 2 g via INTRAVENOUS
  Filled 2020-09-15: qty 100

## 2020-09-15 MED ORDER — FENTANYL CITRATE (PF) 100 MCG/2ML IJ SOLN
INTRAMUSCULAR | Status: AC
  Filled 2020-09-15: qty 2

## 2020-09-15 MED ORDER — IOHEXOL 300 MG/ML  SOLN
50.0000 mL | Freq: Once | INTRAMUSCULAR | Status: AC | PRN
  Administered 2020-09-15: 2 mL

## 2020-09-15 MED ORDER — CEFAZOLIN SODIUM-DEXTROSE 2-4 GM/100ML-% IV SOLN: 2.0000 g | Freq: Once | INTRAVENOUS | Status: AC

## 2020-09-15 MED ORDER — LIDOCAINE HCL 1 % IJ SOLN
INTRAMUSCULAR | Status: AC
  Filled 2020-09-15: qty 20

## 2020-09-15 MED ORDER — BUPIVACAINE HCL (PF) 0.5 % IJ SOLN
INTRAMUSCULAR | Status: AC
  Administered 2020-09-15: 20 mL
  Filled 2020-09-15: qty 30

## 2020-09-15 MED ORDER — MIDAZOLAM HCL 2 MG/2ML IJ SOLN
INTRAMUSCULAR | Status: AC
  Filled 2020-09-15: qty 2

## 2020-09-15 MED ORDER — TOBRAMYCIN SULFATE 1.2 G IJ SOLR
INTRAMUSCULAR | Status: AC
  Filled 2020-09-15: qty 1.2

## 2020-09-15 NOTE — Progress Notes (Signed)
Pt back from procedure, Pt alert/oriented in no apparent distress. No complaints.

## 2020-09-15 NOTE — Procedures (Signed)
S/P T 8  VP with biopsy. S.Keri Tavella MD

## 2020-09-15 NOTE — Progress Notes (Signed)
PROGRESS NOTE    Michele Green  HKV:[id number] DOB: February 06, 1941 DOA: 09/12/2020 PCP: [name], MD     Brief Narrative:  Michele Green a 79 y.o.femalewith medical history significant ofa fib, CAD s/p CABG, breast cancer. Presenting with back pain. She reports that she was at rehab for recurrent T7 compression fracture when she noticed worsening back pain in the same region. This was a couple weeks ago. It has worsened since that time. Her normal pain medicines have not been able to provide relief. She was supposed to go to orthopedics for a follow up today, but she was unable to due to pain. She came to the ED instead. She denies any other aggravating or alleviating factors. She denies any other treatments.XR was obtain and showed new T8 compression fracture, IR consulted for kyphoplasty.   New events last 24 hours / Subjective: Having severe pain in her back, awaiting kyphoplasty.  On telemetry, her heart rate is low 100s  Assessment & Plan:   Principal Problem:   Compression fracture of thoracic spine, non-traumatic (HCC) Active Problems:   Essential hypertension   On continuous oral anticoagulation   Persistent atrial fibrillation (HCC)   Chronic diastolic HF (heart failure) (HCC)   T8 vertebral fracture (HCC)   T8 compression fracture -Planning for kyphoplasty 10/25 by IR -Continue pain control -Will need PT OT  A. fib RVR -Patient has history of permanent atrial fibrillation.  On Xarelto at baseline which is on hold pending her procedure  -Continue digoxin and metoprolol  Chronic diastolic heart failure -Without acute exacerbation at this time  Hyperlipidemia -Continue Lipitor   Ulcerative colitis -Continue mesalamine, prednisone.  On Humira     DVT prophylaxis: Xarelto on hold due to planned procedure  Place TED hose Start: 09/12/20 1728  Code Status: DNR Family Communication: No family at bedside Disposition Plan:  Status is:  Inpatient  Remains inpatient appropriate because:Ongoing active pain requiring inpatient pain management and IV treatments appropriate due to intensity of illness or inability to take PO   Dispo: The patient is from: ALF              Anticipated d/c is to: ALF              Anticipated d/c date is: 2 days              Patient currently is not medically stable to d/c.  Planning for kyphoplasty with IR 10/25.      Consultants:   IR  Procedures:   None  Antimicrobials:  Anti-infectives (From admission, onward)   None       Objective: Vitals:   09/14/20 1050 09/14/20 1600 09/14/20 2024 09/15/20 0553  BP: 131/71 107/83 120/79 124/86  Pulse: (!) 109 95 (!) 110 90  Resp:  16 18 18   Temp: 97.8 F (36.6 C) 98 F (36.7 C) (!) 97.5 F (36.4 C) 97.8 F (36.6 C)  TempSrc: Oral Oral Oral Oral  SpO2: 93% 91% 96% 97%  Weight:      Height:        Intake/Output Summary (Last 24 hours) at 09/15/2020 1014 Last data filed at 09/15/2020 0500 Gross per 24 hour  Intake 661.46 ml  Output 500 ml  Net 161.46 ml   Filed Weights   09/12/20 1054  Weight: 68 kg    Examination: General exam: Appears calm and uncomfortable  Respiratory system: Clear to auscultation. Respiratory effort normal. Cardiovascular system: S1 & S2 heard, irregular rhythm, tachycardic  rate 100s. No pedal edema. Gastrointestinal system: Abdomen is nondistended, soft and nontender. Normal bowel sounds heard. Central nervous system: Alert and oriented. Non focal exam. Speech clear  Extremities: Symmetric in appearance bilaterally  Skin: No rashes, lesions or ulcers on exposed skin  Psychiatry: Judgement and insight appear stable. Mood & affect poor.   Data Reviewed: I have personally reviewed following labs and imaging studies  CBC: Recent Labs  Lab 09/12/20 1350 09/13/20 0159 09/15/20 0055  WBC 11.6* 8.5 6.8  NEUTROABS 10.0*  --   --   HGB 12.3 11.9* 11.8*  HCT 38.5 37.1 36.5  MCV 105.5* 105.7*  104.9*  PLT 125* 128* 024*   Basic Metabolic Panel: Recent Labs  Lab 09/12/20 1350 09/13/20 0159 09/15/20 0055  NA 141 140 139  K 3.9 4.0 4.2  CL 108 107 109  CO2 24 23 19*  GLUCOSE 148* 100* 120*  BUN 25* 21 12  CREATININE 0.81 0.65 0.65  CALCIUM 9.1 9.1 9.2   GFR: Estimated Creatinine Clearance: 52.2 mL/min (by C-G formula based on SCr of 0.65 mg/dL). Liver Function Tests: Recent Labs  Lab 09/13/20 0159  AST 26  ALT 29  ALKPHOS 43  BILITOT 1.0  PROT 5.4*  ALBUMIN 2.9*   No results for input(s): LIPASE, AMYLASE in the last 168 hours. No results for input(s): AMMONIA in the last 168 hours. Coagulation Profile: Recent Labs  Lab 09/15/20 0804  INR 1.1   Cardiac Enzymes: No results for input(s): CKTOTAL, CKMB, CKMBINDEX, TROPONINI in the last 168 hours. BNP (last 3 results) No results for input(s): PROBNP in the last 8760 hours. HbA1C: No results for input(s): HGBA1C in the last 72 hours. CBG: No results for input(s): GLUCAP in the last 168 hours. Lipid Profile: No results for input(s): CHOL, HDL, LDLCALC, TRIG, CHOLHDL, LDLDIRECT in the last 72 hours. Thyroid Function Tests: No results for input(s): TSH, T4TOTAL, FREET4, T3FREE, THYROIDAB in the last 72 hours. Anemia Panel: Recent Labs    09/13/20 0159  VITAMINB12 789  FOLATE 31.1   Sepsis Labs: No results for input(s): PROCALCITON, LATICACIDVEN in the last 168 hours.  Recent Results (from the past 240 hour(s))  Respiratory Panel by RT PCR (Flu A&B, Covid) - Nasopharyngeal Swab     Status: None   Collection Time: 09/12/20  1:52 PM   Specimen: Nasopharyngeal Swab  Result Value Ref Range Status   SARS Coronavirus 2 by RT PCR NEGATIVE NEGATIVE Final    Comment: (NOTE) SARS-CoV-2 target nucleic acids are NOT DETECTED.  The SARS-CoV-2 RNA is generally detectable in upper respiratoy specimens during the acute phase of infection. The lowest concentration of SARS-CoV-2 viral copies this assay can detect  is 131 copies/mL. A negative result does not preclude SARS-Cov-2 infection and should not be used as the sole basis for treatment or other patient management decisions. A negative result may occur with  improper specimen collection/handling, submission of specimen other than nasopharyngeal swab, presence of viral mutation(s) within the areas targeted by this assay, and inadequate number of viral copies (<131 copies/mL). A negative result must be combined with clinical observations, patient history, and epidemiological information. The expected result is Negative.  Fact Sheet for Patients:  PinkCheek.be  Fact Sheet for Healthcare Providers:  GravelBags.it  This test is no t yet approved or cleared by the [place] FDA and  has been authorized for detection and/or diagnosis of SARS-CoV-2 by FDA under an Emergency Use Authorization (EUA). This EUA will remain  in effect (  meaning this test can be used) for the duration of the COVID-19 declaration under Section 564(b)(1) of the Act, 21 U.S.C. section 360bbb-3(b)(1), unless the authorization is terminated or revoked sooner.     Influenza A by PCR NEGATIVE NEGATIVE Final   Influenza B by PCR NEGATIVE NEGATIVE Final    Comment: (NOTE) The Xpert Xpress SARS-CoV-2/FLU/RSV assay is intended as an aid in  the diagnosis of influenza from Nasopharyngeal swab specimens and  should not be used as a sole basis for treatment. Nasal washings and  aspirates are unacceptable for Xpert Xpress SARS-CoV-2/FLU/RSV  testing.  Fact Sheet for Patients: PinkCheek.be  Fact Sheet for Healthcare Providers: GravelBags.it  This test is not yet approved or cleared by the [place] FDA and  has been authorized for detection and/or diagnosis of SARS-CoV-2 by  FDA under an Emergency Use Authorization (EUA). This EUA will remain  in effect  (meaning this test can be used) for the duration of the  Covid-19 declaration under Section 564(b)(1) of the Act, 21  U.S.C. section 360bbb-3(b)(1), unless the authorization is  terminated or revoked. Performed at [facility], [place] [address], [place], [place] [address]       Radiology Studies: No results found.    Scheduled Meds:  allopurinol  200 mg Oral QPM   atorvastatin  20 mg Oral q1800   calcium citrate  200 mg of elemental calcium Oral TID   digoxin  0.125 mg Oral Q MTWThF   docusate sodium  100 mg Oral BID   influenza vaccine adjuvanted  0.5 mL Intramuscular Tomorrow-1000   Mesalamine  800 mg Oral BID   metoprolol tartrate  100 mg Oral BID   predniSONE  20 mg Oral Daily   senna-docusate  2 tablet Oral BID   Vitamin D (Ergocalciferol)  50,000 Units Oral Q7 days   Continuous Infusions:  sodium chloride 75 mL/hr at 09/14/20 1402     LOS: 2 days      Time spent: 35 minutes   Dessa Phi, DO [facility] 09/15/2020, 10:14 AM   Available via Epic secure chat 7am-7pm After these hours, please refer to coverage provider listed on [email or web address]

## 2020-09-16 DIAGNOSIS — M4854XA Collapsed vertebra, not elsewhere classified, thoracic region, initial encounter for fracture: Secondary | ICD-10-CM | POA: Diagnosis not present

## 2020-09-16 MED ORDER — RIVAROXABAN 15 MG PO TABS
15.0000 mg | ORAL_TABLET | Freq: Every day | ORAL | Status: DC
  Administered 2020-09-16 – 2020-09-18 (×3): 15 mg via ORAL
  Filled 2020-09-16 (×4): qty 1

## 2020-09-16 NOTE — TOC Initial Note (Signed)
Transition of Care [facility]) - Initial/Assessment Note    Patient Details  Name: Michele Green MRN: [id number] Date of Birth: 12-19-1940  Transition of Care [facility]) CM/SW Contact:    Emeterio Reeve, [place] Phone Number: 09/16/2020, 3:31 PM  Clinical Narrative:                  CSW met with pt at bedside. CSW introduced self and explained her role at the hospital.  Pt reports she has recently moved into [place]. Pt reports she was independent.   CSW reviewed pt reccs of 24 hour supervision and [place] services. CSW sent notes to [facility] to review. [facility] will call back after its reviewed.   Expected Discharge Plan: Skilled Nursing Facility Barriers to Discharge: Continued Medical Work up   Patient Goals and CMS Choice Patient states their goals for this hospitalization and ongoing recovery are:: Return home CMS Medicare.gov Compare Post Acute Care list provided to:: Patient Choice offered to / list presented to : Patient  Expected Discharge Plan and Services Expected Discharge Plan: [place] arrangements for the past 2 months: [place]                                      Prior Living Arrangements/Services Living arrangements for the past 2 months: [place] Lives with:: Self Patient language and need for interpreter reviewed:: Yes Do you feel safe going back to the place where you live?: Yes      Need for Family Participation in Patient Care: Yes (Comment) Care giver support system in place?: Yes (comment)   Criminal Activity/Legal Involvement Pertinent to Current Situation/Hospitalization: No - Comment as needed  Activities of Daily Living Home Assistive Devices/Equipment: Eyeglasses, Environmental consultant (specify type), Cane (specify quad or straight) ADL Screening (condition at time of admission) Patient's cognitive ability adequate to safely complete daily activities?: Yes Is the patient deaf or  have difficulty hearing?: No Does the patient have difficulty seeing, even when wearing glasses/contacts?: No Does the patient have difficulty concentrating, remembering, or making decisions?: No Patient able to express need for assistance with ADLs?: Yes Does the patient have difficulty dressing or bathing?: No Independently performs ADLs?: Yes (appropriate for developmental age) Does the patient have difficulty walking or climbing stairs?: Yes Weakness of Legs: Both Weakness of Arms/Hands: None  Permission Sought/Granted         Permission granted to share info w AGENCY: [facility]        Emotional Assessment Appearance:: Appears stated age Attitude/Demeanor/Rapport: Engaged Affect (typically observed): Appropriate Orientation: : Oriented to Self, Oriented to Place, Oriented to  Time, Oriented to Situation Alcohol / Substance Use: Not Applicable Psych Involvement: No (comment)  Admission diagnosis:  Compression fracture of C-spine ([place]) [S12.9XXA] Compression fracture of body of thoracic vertebra ([place]) [S22.000A] Compression fracture of T8 vertebra, initial encounter ([place]) [S22.060A] T8 vertebral fracture ([place]) [S22.069A] Patient Active Problem List   Diagnosis Date Noted  . T8 vertebral fracture ([place]) 09/13/2020  . Compression fracture of C-spine ([place]) 09/12/2020  . Compression fracture of thoracic spine, non-traumatic ([place]) 09/12/2020  . Goals of care, counseling/discussion   . Palliative care by specialist   . Spinal fracture of T7 vertebra ([place]) 08/17/2020  . Atrial fibrillation with RVR ([place]) 08/16/2020  . Long term (current) use of anticoagulants 08/28/2018  . Atrial flutter  with rapid ventricular response ([place]) 08/14/2018  . 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 S/P Maze operation for atrial fibrillation 08/10/2018  . Chronic diastolic HF (heart failure) ([place]) 06/20/2018  . Osteoporosis 09/29/2017  . Loosening of hardware in spine  ([place]) 08/23/2017  . Spinal stenosis of lumbar region 08/11/2016  . Unspecified cord compression ([place]) 08/11/2016  . Unstable burst fracture of third lumbar vertebra ([place]) 08/11/2016  . Genetic testing 03/17/2016  . Family history of breast cancer   . Breast cancer of upper-outer quadrant of left female breast ([place]) 02/02/2016  . On amiodarone therapy 09/09/2015  . On continuous oral anticoagulation 08/22/2015  . Persistent atrial fibrillation ([place]) 08/22/2015  . Essential hypertension 11/07/2013  . Mitral regurgitation 11/07/2013   PCP:  [name], MD Pharmacy:   [id number] [place], [place] [address]Phone: [phone]: [phone] [address] [place]Phone: [phone]: [phone]    Social Determinants of Health (SDOH) Interventions    Readmission Risk Interventions No flowsheet data found.  MEmeterio Reeve LLatanya Presser LBenbowSocial Worker [phone]

## 2020-09-16 NOTE — Plan of Care (Signed)
  Problem: Education: Goal: Knowledge of General Education information will improve Description: Including pain rating scale, medication(s)/side effects and non-pharmacologic comfort measures Outcome: Progressing   Problem: Health Behavior/Discharge Planning: Goal: Ability to manage health-related needs will improve Outcome: Progressing   Problem: Clinical Measurements: Goal: Will remain free from infection Outcome: Progressing   Problem: Activity: Goal: Risk for activity intolerance will decrease Outcome: Progressing   Problem: Nutrition: Goal: Adequate nutrition will be maintained Outcome: Progressing   Problem: Coping: Goal: Level of anxiety will decrease Outcome: Progressing   Problem: Pain Managment: Goal: General experience of comfort will improve Outcome: Progressing

## 2020-09-16 NOTE — Evaluation (Signed)
Physical Therapy Evaluation Patient Details Name: Michele Green MRN: [id number] DOB: [birth year] Today's Date: 09/16/2020   History of Present Illness  Pt is 79 yo female with PMH including afib, CAD, CABG, and breast CA.  Pt with recent T7 compression fracture with vertebroplasty last month.  Pt now with new T8 compression fx and s/p vertebroplasty on 09/15/20.  Clinical Impression  Pt admitted with above diagnosis. Pt with good progress during PT evaluation today.  She was able to recall back precautions without cues.  She performed transfers with supervision and ambulation in room with min guard for safety and RW.  Pt with necessary DME and from ALF.  Pt was anxious and required cues for controlled breaths and relaxation during evaluation. Pt currently with functional limitations due to the deficits listed below (see PT Problem List). Pt will benefit from skilled PT to increase their independence and safety with mobility to allow discharge to the venue listed below.       Follow Up Recommendations Supervision/Assistance - 24 hour;Other (comment);Home health PT (return to ALF)    Equipment Recommendations  None recommended by PT    Recommendations for Other Services       Precautions / Restrictions Precautions Precautions: Back;Fall Precaution Comments: Pt recalled back precautions without cues (has recently been at rehab for prior vertebroplasty)      Mobility  Bed Mobility Overal bed mobility: Needs Assistance Bed Mobility: Sidelying to Sit   Sidelying to sit: Supervision;HOB elevated       General bed mobility comments: recalled log roll technique    Transfers Overall transfer level: Needs assistance Equipment used: Rolling walker (2 wheeled) Transfers: Sit to/from Stand Sit to Stand: Min guard         General transfer comment: min guard for safety; cues for hand placement  Ambulation/Gait Ambulation/Gait assistance: Min guard Gait Distance (Feet): 50  Feet Assistive device: Rolling walker (2 wheeled) Gait Pattern/deviations: Step-to pattern;Decreased stride length Gait velocity: Decreased   General Gait Details: min cues for RW proximity; min guard for safety  Stairs            Wheelchair Mobility    Modified Rankin (Stroke Patients Only)       Balance Overall balance assessment: Needs assistance Sitting-balance support: No upper extremity supported;Feet supported Sitting balance-Leahy Scale: Good     Standing balance support: Bilateral upper extremity supported Standing balance-Leahy Scale: Poor Standing balance comment: used RW for safety                             Pertinent Vitals/Pain Pain Assessment: 0-10 Pain Score: 5  Pain Location: Back Pain Descriptors / Indicators: Sore Pain Intervention(s): Limited activity within patient's tolerance;Monitored during session;Premedicated before session;Repositioned;Relaxation    Home Living Family/patient expects to be discharged to:: Assisted living Living Arrangements: Spouse/significant other Available Help at Discharge: Family;Available 24 hours/day Type of Home: Assisted living Home Access: Level entry     Home Layout: One level Home Equipment: Walker - 2 wheels;Walker - 4 wheels;Wheelchair - manual;Shower seat;Grab bars - toilet      Prior Function Level of Independence: Needs assistance   Gait / Transfers Assistance Needed: Pt reports up until the last month (previous vertebroplasty) she could ambulate in community without AD.  Since that time has been using w/c, rollator, or RW - depends on the day  ADL's / Homemaking Assistance Needed: Gets meals at facility; she is independent with ADLs  Hand Dominance   Dominant Hand: Right    Extremity/Trunk Assessment   Upper Extremity Assessment Upper Extremity Assessment: Overall WFL for tasks assessed    Lower Extremity Assessment Lower Extremity Assessment: Overall WFL for tasks  assessed (Grossly WFL, did not MMT due to back pain/recent procedure)    Cervical / Trunk Assessment Cervical / Trunk Assessment: Normal  Communication   Communication: No difficulties  Cognition Arousal/Alertness: Awake/alert Behavior During Therapy: Anxious Overall Cognitive Status: Within Functional Limits for tasks assessed                                 General Comments: Pt was able to recall back precautions without cues and alert and oriented x 4; pt was anxious and restless in bed      General Comments      Exercises     Assessment/Plan    PT Assessment Patient needs continued PT services  PT Problem List Decreased strength;Decreased activity tolerance;Decreased balance;Decreased mobility;Decreased cognition;Decreased knowledge of use of DME;Decreased safety awareness;Decreased knowledge of precautions;Cardiopulmonary status limiting activity;Pain       PT Treatment Interventions DME instruction;Gait training;Functional mobility training;Therapeutic activities;Therapeutic exercise;Balance training;Cognitive remediation;Patient/family education    PT Goals (Current goals can be found in the Care Plan section)  Acute Rehab PT Goals Patient Stated Goal: return home PT Goal Formulation: With patient Time For Goal Achievement: 09/30/20 Potential to Achieve Goals: Good    Frequency Min 3X/week   Barriers to discharge        Co-evaluation               AM-PAC PT "6 Clicks" Mobility  Outcome Measure Help needed turning from your back to your side while in a flat bed without using bedrails?: None Help needed moving from lying on your back to sitting on the side of a flat bed without using bedrails?: None Help needed moving to and from a bed to a chair (including a wheelchair)?: None Help needed standing up from a chair using your arms (e.g., wheelchair or bedside chair)?: A Little Help needed to walk in hospital room?: A Little Help needed  climbing 3-5 steps with a railing? : A Little 6 Click Score: 21    End of Session Equipment Utilized During Treatment: Gait belt Activity Tolerance: Patient tolerated treatment well Patient left: in chair;with chair alarm set;with call bell/phone within reach Nurse Communication: Mobility status PT Visit Diagnosis: Other abnormalities of gait and mobility (R26.89);Muscle weakness (generalized) (M62.81)    Time: 4097-3532 PT Time Calculation (min) (ACUTE ONLY): 33 min   Charges:   PT Evaluation $PT Eval Moderate Complexity: 1 Mod PT Treatments $Therapeutic Activity: 8-22 mins        Abran Richard, PT Acute Rehab Services Pager [phone] [facility] [place] 09/16/2020, 12:59 PM

## 2020-09-16 NOTE — Progress Notes (Signed)
PROGRESS NOTE    Michele Green  OZY:[id number] DOB: 1941-05-25 DOA: 09/12/2020 PCP: [name], MD     Brief Narrative:  Michele Green a 79 y.o.femalewith medical history significant ofa fib, CAD s/p CABG, breast cancer. Presenting with back pain. She reports that she was at rehab for recurrent T7 compression fracture when she noticed worsening back pain in the same region. This was a couple weeks ago. It has worsened since that time. Her normal pain medicines have not been able to provide relief. She was supposed to go to orthopedics for a follow up today, but she was unable to due to pain. She came to the ED instead. She denies any other aggravating or alleviating factors. She denies any other treatments.XR was obtain and showed new T8 compression fracture, IR consulted for kyphoplasty. Patient underwent kyphoplasty with bone biopsy 10/25.   New events last 24 hours / Subjective: Expresses that she did not like the proceduralist yesterday who performed kyphoplasty. Continues to have pain. Discussed with her that we do not have further procedures planned. She is agreeable to participating with PT today. HR in 100s on telemetry.   Assessment & Plan:   Principal Problem:   Compression fracture of thoracic spine, non-traumatic (HCC) Active Problems:   Essential hypertension   On continuous oral anticoagulation   Persistent atrial fibrillation (HCC)   Chronic diastolic HF (heart failure) (HCC)   T8 vertebral fracture (HCC)   T8 compression fracture -S/p kyphoplasty 10/25 by IR -Bone biopsy pending and will need follow up  -Continue pain control -PT OT   A. fib RVR -Patient has history of permanent atrial fibrillation -Continue digoxin and metoprolol -Continue xarelto - discussed with IR, may resume 24 hours after procedure   Chronic diastolic heart failure -Without acute exacerbation at this time  Hyperlipidemia -Continue Lipitor   Ulcerative  colitis -Continue mesalamine, prednisone.  On Humira     DVT prophylaxis: Resume xarelto tonight  Place TED hose Start: 09/12/20 1728  Code Status: DNR Family Communication: No family at bedside Disposition Plan:  Status is: Inpatient  Remains inpatient appropriate because:Unsafe d/c plan   Dispo: The patient is from: ALF              Anticipated d/c is to: ALF vs SNF, pending PT OT eval               Anticipated d/c date is: 1 day              Patient currently is not medically stable to d/c.  PT OT eval for dispo   Consultants:   IR  Procedures:   Kyphoplasty 10/25  Antimicrobials:  Anti-infectives (From admission, onward)   Start     Dose/Rate Route Frequency Ordered Stop   09/15/20 1545  ceFAZolin (ANCEF) IVPB 2g/100 mL premix        2 g 200 mL/hr over 30 Minutes Intravenous  Once 09/15/20 1446 09/15/20 1550   09/15/20 1512  tobramycin (NEBCIN) 1.2 g powder       Note to Pharmacy: Darcel Bayley   : cabinet override      09/15/20 1512 09/16/20 0314       Objective: Vitals:   09/15/20 1550 09/15/20 1555 09/15/20 2100 09/16/20 0500  BP: 133/81 131/83 (!) 88/56 113/67  Pulse: (!) 119 100 99 90  Resp: 18 17 17 16   Temp:   98 F (36.7 C) 97.9 F (36.6 C)  TempSrc:   Oral Oral  SpO2:  100% 100% 98% 97%  Weight:      Height:        Intake/Output Summary (Last 24 hours) at 09/16/2020 0933 Last data filed at 09/15/2020 1550 Gross per 24 hour  Intake 100 ml  Output --  Net 100 ml   Filed Weights   09/12/20 1054  Weight: 68 kg    Examination: General exam: Appears calm  Respiratory system: Clear to auscultation. Respiratory effort normal. Cardiovascular system: S1 & S2 heard, RRR. No pedal edema. Gastrointestinal system: Abdomen is nondistended, soft and nontender. Normal bowel sounds heard. Central nervous system: Alert and oriented. Non focal exam. Speech clear  Extremities: Symmetric in appearance bilaterally  Skin: No rashes, lesions or ulcers on  exposed skin  Psychiatry: Judgement and insight appear poor but stable  Data Reviewed: I have personally reviewed following labs and imaging studies  CBC: Recent Labs  Lab 09/12/20 1350 09/13/20 0159 09/15/20 0055  WBC 11.6* 8.5 6.8  NEUTROABS 10.0*  --   --   HGB 12.3 11.9* 11.8*  HCT 38.5 37.1 36.5  MCV 105.5* 105.7* 104.9*  PLT 125* 128* 038*   Basic Metabolic Panel: Recent Labs  Lab 09/12/20 1350 09/13/20 0159 09/15/20 0055  NA 141 140 139  K 3.9 4.0 4.2  CL 108 107 109  CO2 24 23 19*  GLUCOSE 148* 100* 120*  BUN 25* 21 12  CREATININE 0.81 0.65 0.65  CALCIUM 9.1 9.1 9.2   GFR: Estimated Creatinine Clearance: 52.2 mL/min (by C-G formula based on SCr of 0.65 mg/dL). Liver Function Tests: Recent Labs  Lab 09/13/20 0159  AST 26  ALT 29  ALKPHOS 43  BILITOT 1.0  PROT 5.4*  ALBUMIN 2.9*   No results for input(s): LIPASE, AMYLASE in the last 168 hours. No results for input(s): AMMONIA in the last 168 hours. Coagulation Profile: Recent Labs  Lab 09/15/20 0804  INR 1.1   Cardiac Enzymes: No results for input(s): CKTOTAL, CKMB, CKMBINDEX, TROPONINI in the last 168 hours. BNP (last 3 results) No results for input(s): PROBNP in the last 8760 hours. HbA1C: No results for input(s): HGBA1C in the last 72 hours. CBG: No results for input(s): GLUCAP in the last 168 hours. Lipid Profile: No results for input(s): CHOL, HDL, LDLCALC, TRIG, CHOLHDL, LDLDIRECT in the last 72 hours. Thyroid Function Tests: No results for input(s): TSH, T4TOTAL, FREET4, T3FREE, THYROIDAB in the last 72 hours. Anemia Panel: No results for input(s): VITAMINB12, FOLATE, FERRITIN, TIBC, IRON, RETICCTPCT in the last 72 hours. Sepsis Labs: No results for input(s): PROCALCITON, LATICACIDVEN in the last 168 hours.  Recent Results (from the past 240 hour(s))  Respiratory Panel by RT PCR (Flu A&B, Covid) - Nasopharyngeal Swab     Status: None   Collection Time: 09/12/20  1:52 PM    Specimen: Nasopharyngeal Swab  Result Value Ref Range Status   SARS Coronavirus 2 by RT PCR NEGATIVE NEGATIVE Final    Comment: (NOTE) SARS-CoV-2 target nucleic acids are NOT DETECTED.  The SARS-CoV-2 RNA is generally detectable in upper respiratoy specimens during the acute phase of infection. The lowest concentration of SARS-CoV-2 viral copies this assay can detect is 131 copies/mL. A negative result does not preclude SARS-Cov-2 infection and should not be used as the sole basis for treatment or other patient management decisions. A negative result may occur with  improper specimen collection/handling, submission of specimen other than nasopharyngeal swab, presence of viral mutation(s) within the areas targeted by this assay, and inadequate number of  viral copies (<131 copies/mL). A negative result must be combined with clinical observations, patient history, and epidemiological information. The expected result is Negative.  Fact Sheet for Patients:  PinkCheek.be  Fact Sheet for Healthcare Providers:  GravelBags.it  This test is no t yet approved or cleared by the [place] FDA and  has been authorized for detection and/or diagnosis of SARS-CoV-2 by FDA under an Emergency Use Authorization (EUA). This EUA will remain  in effect (meaning this test can be used) for the duration of the COVID-19 declaration under Section 564(b)(1) of the Act, 21 U.S.C. section 360bbb-3(b)(1), unless the authorization is terminated or revoked sooner.     Influenza A by PCR NEGATIVE NEGATIVE Final   Influenza B by PCR NEGATIVE NEGATIVE Final    Comment: (NOTE) The Xpert Xpress SARS-CoV-2/FLU/RSV assay is intended as an aid in  the diagnosis of influenza from Nasopharyngeal swab specimens and  should not be used as a sole basis for treatment. Nasal washings and  aspirates are unacceptable for Xpert Xpress SARS-CoV-2/FLU/RSV   testing.  Fact Sheet for Patients: PinkCheek.be  Fact Sheet for Healthcare Providers: GravelBags.it  This test is not yet approved or cleared by the [place] FDA and  has been authorized for detection and/or diagnosis of SARS-CoV-2 by  FDA under an Emergency Use Authorization (EUA). This EUA will remain  in effect (meaning this test can be used) for the duration of the  Covid-19 declaration under Section 564(b)(1) of the Act, 21  U.S.C. section 360bbb-3(b)(1), unless the authorization is  terminated or revoked. Performed at [facility], [place], [place], [place] [address]       Radiology Studies: No results found.    Scheduled Meds: . allopurinol  200 mg Oral QPM  . atorvastatin  20 mg Oral q1800  . calcium citrate  200 mg of elemental calcium Oral TID  . digoxin  0.125 mg Oral Q MTWThF  . docusate sodium  100 mg Oral BID  . influenza vaccine adjuvanted  0.5 mL Intramuscular Tomorrow-1000  . Mesalamine  800 mg Oral BID  . metoprolol tartrate  100 mg Oral BID  . predniSONE  20 mg Oral Daily  . senna-docusate  2 tablet Oral BID  . Vitamin D (Ergocalciferol)  50,000 Units Oral Q7 days   Continuous Infusions:    LOS: 3 days      Time spent: 20 minutes   Dessa Phi, DO [facility] 09/16/2020, 9:33 AM   Available via Epic secure chat 7am-7pm After these hours, please refer to coverage provider listed on [email or web address]

## 2020-09-17 DIAGNOSIS — S22000A Wedge compression fracture of unspecified thoracic vertebra, initial encounter for closed fracture: Secondary | ICD-10-CM | POA: Diagnosis not present

## 2020-09-17 DIAGNOSIS — I10 Essential (primary) hypertension: Secondary | ICD-10-CM

## 2020-09-17 DIAGNOSIS — S22060A Wedge compression fracture of T7-T8 vertebra, initial encounter for closed fracture: Secondary | ICD-10-CM | POA: Diagnosis not present

## 2020-09-17 DIAGNOSIS — R52 Pain, unspecified: Secondary | ICD-10-CM

## 2020-09-17 DIAGNOSIS — I4819 Other persistent atrial fibrillation: Secondary | ICD-10-CM | POA: Diagnosis not present

## 2020-09-17 MED ORDER — FENTANYL 12 MCG/HR TD PT72
1.0000 | MEDICATED_PATCH | TRANSDERMAL | Status: DC
  Administered 2020-09-17: 1 via TRANSDERMAL
  Filled 2020-09-17: qty 1

## 2020-09-17 MED ORDER — FENTANYL 25 MCG/HR TD PT72
1.0000 | MEDICATED_PATCH | TRANSDERMAL | Status: DC
Start: 2020-09-17 — End: 2020-09-17

## 2020-09-17 MED ORDER — CYCLOBENZAPRINE HCL 5 MG PO TABS
5.0000 mg | ORAL_TABLET | Freq: Three times a day (TID) | ORAL | Status: DC
  Administered 2020-09-17 – 2020-09-19 (×7): 5 mg via ORAL
  Filled 2020-09-17 (×7): qty 1

## 2020-09-17 MED ORDER — ACETAMINOPHEN 325 MG PO TABS
650.0000 mg | ORAL_TABLET | Freq: Four times a day (QID) | ORAL | Status: DC | PRN
  Administered 2020-09-17: 650 mg via ORAL
  Filled 2020-09-17 (×2): qty 2

## 2020-09-17 NOTE — Discharge Instructions (Signed)
1.No stooping  or bending or lifting more than  10 lbs for  2 weeks. 2.Use walker to ambulate for 2 weeks. 3.No driving for  2 weeks. 4 RTC PRN 2 weeks . 5 Biopsy report to be sent to requesting MD  Information on my medicine - XARELTO (Rivaroxaban)  This medication education was reviewed with me or my healthcare representative as part of my discharge preparation.   Why was Xarelto prescribed for you? Xarelto was prescribed for you to reduce the risk of a blood clot forming that can cause a stroke if you have a medical condition called atrial fibrillation (a type of irregular heartbeat).  What do you need to know about xarelto ? Take your Xarelto ONCE DAILY at the same time every day with your evening meal. If you have difficulty swallowing the tablet whole, you may crush it and mix in applesauce just prior to taking your dose.  Take Xarelto exactly as prescribed by your doctor and DO NOT stop taking Xarelto without talking to the doctor who prescribed the medication.  Stopping without other stroke prevention medication to take the place of Xarelto may increase your risk of developing a clot that causes a stroke.  Refill your prescription before you run out.  After discharge, you should have regular check-up appointments with your healthcare provider that is prescribing your Xarelto.  In the future your dose may need to be changed if your kidney function or weight changes by a significant amount.  What do you do if you miss a dose? If you are taking Xarelto ONCE DAILY and you miss a dose, take it as soon as you remember on the same day then continue your regularly scheduled once daily regimen the next day. Do not take two doses of Xarelto at the same time or on the same day.   Important Safety Information A possible side effect of Xarelto is bleeding. You should call your healthcare provider right away if you experience any of the following: ? Bleeding from an injury or your  nose that does not stop. ? Unusual colored urine (red or dark brown) or unusual colored stools (red or black). ? Unusual bruising for unknown reasons. ? A serious fall or if you hit your head (even if there is no bleeding).  Some medicines may interact with Xarelto and might increase your risk of bleeding while on Xarelto. To help avoid this, consult your healthcare provider or pharmacist prior to using any new prescription or non-prescription medications, including herbals, vitamins, non-steroidal anti-inflammatory drugs (NSAIDs) and supplements.  This website has more information on Xarelto: [email or web address]-benson.[email or web address]

## 2020-09-17 NOTE — Evaluation (Signed)
Occupational Therapy Evaluation Patient Details Name: Michele Green MRN: [id number] DOB: 08-Jun-1941 Today's Date: 09/17/2020    History of Present Illness Pt is 79 yo female with PMH including afib, CAD, CABG, and breast CA.  Pt with recent T7 compression fracture with vertebroplasty last month.  Pt now with new T8 compression fx and s/p vertebroplasty on 09/15/20.   Clinical Impression   PTA, pt was living at ALF alone, and reports she was independent with ADL/IADL and functional mobility with use of rollator since previous vertebroplasty. Pt currently requires minguard assistance for functional mobility at RW level, she requires minA for LB dressing. Pt verbalized decreased support available at home at d.c and verbalized preference to d/c to SNF prior to returning home. Due to decline in current level of function, pt would benefit from acute OT to address established goals to facilitate safe D/C to venue listed below. At this time, recommend SNF at ALF facility follow-up. However, if pt could have assistance from ALF for dressing/bathing feel she can d/c home with [facility] Will continue to follow acutely and progress appropriately.    Follow Up Recommendations  SNF;Supervision/Assistance - 24 hour;Home health OT    Equipment Recommendations  None recommended by OT    Recommendations for Other Services       Precautions / Restrictions Precautions Precautions: Back;Fall Precaution Booklet Issued: No Precaution Comments: Pt recalled back precautions without cues (has recently been at rehab for prior vertebroplasty) Restrictions Weight Bearing Restrictions: No      Mobility Bed Mobility Overal bed mobility: Needs Assistance Bed Mobility: Sidelying to Sit Rolling: Supervision Sidelying to sit: Supervision;HOB elevated     Sit to sidelying: Supervision General bed mobility comments: recalled log roll technique, completed with supervision for safety    Transfers Overall transfer  level: Needs assistance Equipment used: Rolling walker (2 wheeled) Transfers: Sit to/from [facility] Sit to Stand: Min guard Stand pivot transfers: Min guard       General transfer comment: min guard for safety; cues for hand placement    Balance Overall balance assessment: Needs assistance Sitting-balance support: No upper extremity supported;Feet supported Sitting balance-Leahy Scale: Good     Standing balance support: Bilateral upper extremity supported Standing balance-Leahy Scale: Poor Standing balance comment: reliant on BUE support on RW in standing                           ADL either performed or assessed with clinical judgement   ADL Overall ADL's : Needs assistance/impaired Eating/Feeding: Set up;Sitting Eating/Feeding Details (indicate cue type and reason): pt sat EOB to take medication Grooming: Set up;Sitting   Upper Body Bathing: Set up;Sitting   Lower Body Bathing: Minimal assistance;Sit to/from stand Lower Body Bathing Details (indicate cue type and reason): minA to access feet Upper Body Dressing : Set up;Sitting   Lower Body Dressing: Minimal assistance;Sit to/from stand Lower Body Dressing Details (indicate cue type and reason): minA to don ted hose, pt would benefit from AE for LB dressing, pt with increased pain with attempts to figure-4 Toilet Transfer: Stand-pivot;Min Fish farm manager Details (indicate cue type and reason): simulated Toileting- Clothing Manipulation and Hygiene: Sit to/from stand;Min guard       Functional mobility during ADLs: Rolling walker;Min guard General ADL Comments: pt limited by elevated HR up to 137bpm sitting EOB, 147bpm with 1x stand pivot, RN provided medication at start of session.      Vision Baseline Vision/History: Wears  glasses Wears Glasses: At all times Patient Visual Report: No change from baseline Vision Assessment?: No apparent visual deficits     Perception      Praxis      Pertinent Vitals/Pain Pain Assessment: 0-10 Pain Score: 8  Pain Location: Back Pain Descriptors / Indicators: Sore;Constant Pain Intervention(s): Limited activity within patient's tolerance;Monitored during session     Hand Dominance Right   Extremity/Trunk Assessment Upper Extremity Assessment Upper Extremity Assessment: Overall WFL for tasks assessed   Lower Extremity Assessment Lower Extremity Assessment: Overall WFL for tasks assessed   Cervical / Trunk Assessment Cervical / Trunk Assessment: Normal   Communication Communication Communication: No difficulties   Cognition Arousal/Alertness: Awake/alert Behavior During Therapy: Anxious Overall Cognitive Status: Within Functional Limits for tasks assessed                                     General Comments  HR 125-130bpm at rest, up to 137bpm with sitting EOB 147 with stand-pivot    Exercises     Shoulder Instructions      Home Living Family/patient expects to be discharged to:: Assisted living Living Arrangements: Spouse/significant other Available Help at Discharge: Family;Available 24 hours/day Type of Home: Assisted living Home Access: Level entry     Home Layout: One level     Bathroom Shower/Tub: Occupational psychologist: Handicapped height     Home Equipment: Environmental consultant - 2 wheels;Walker - 4 wheels;Wheelchair - manual;Shower seat;Grab bars - toilet   Additional Comments: Sister lives nearby but pt reports not able to provide assist      Prior Functioning/Environment Level of Independence: Needs assistance  Gait / Transfers Assistance Needed: Pt reports up until the last month (previous vertebroplasty) she could ambulate in community without AD.  Since that time has been using w/c, rollator, or RW - depends on the day ADL's / Homemaking Assistance Needed: Gets meals at facility; she is independent with ADLs   Comments: Lives alone, husband passed away 2 years  ago. Mod indep with intermittent use of rollator        OT Problem List: Decreased strength;Decreased activity tolerance;Impaired balance (sitting and/or standing);Pain      OT Treatment/Interventions: Self-care/ADL training;Therapeutic exercise;Therapeutic activities;Balance training    OT Goals(Current goals can be found in the care plan section) Acute Rehab OT Goals Patient Stated Goal: d/c to SNF for short-term rehab before d/cing home OT Goal Formulation: With patient Time For Goal Achievement: 10/01/20 Potential to Achieve Goals: Good ADL Goals Pt Will Perform Grooming: with modified independence;standing Pt Will Perform Lower Body Dressing: with modified independence;sit to/from stand;with adaptive equipment Pt Will Transfer to Toilet: with modified independence;ambulating Additional ADL Goal #1: Pt will demonstrate independence with 3/3 back precautions during ADL/IADL and functional mobility.  OT Frequency: Min 2X/week   Barriers to D/C: Decreased caregiver support  pt lives alone       Co-evaluation              AM-PAC OT "6 Clicks" Daily Activity     Outcome Measure Help from another person eating meals?: None Help from another person taking care of personal grooming?: A Little Help from another person toileting, which includes using toliet, bedpan, or urinal?: A Little Help from another person bathing (including washing, rinsing, drying)?: A Little Help from another person to put on and taking off regular upper body clothing?: A Little Help from  another person to put on and taking off regular lower body clothing?: A Little 6 Click Score: 19   End of Session Equipment Utilized During Treatment: Gait belt;Rolling walker Nurse Communication: Mobility status;Patient requests pain meds  Activity Tolerance: Treatment limited secondary to medical complications (Comment);Patient limited by pain (HR ) Patient left: in bed;with call bell/phone within reach;with bed  alarm set  OT Visit Diagnosis: Unsteadiness on feet (R26.81);Other abnormalities of gait and mobility (R26.89);Muscle weakness (generalized) (M62.81);History of falling (Z91.81);Dizziness and giddiness (R42);Pain Pain - part of body:  (Back)                Time: 9694-0982 OT Time Calculation (min): 32 min Charges:  OT General Charges $OT Visit: 1 Visit OT Evaluation $OT Eval Moderate Complexity: 1 Mod OT Treatments $Self Care/Home Management : 8-22 mins  Helene Kelp OTR/L Acute Rehabilitation Services Office: [phone]   Wyn Forster 09/17/2020, 11:15 AM

## 2020-09-17 NOTE — Progress Notes (Signed)
[facility]  PROGRESS NOTE  Michele Green EYC:[id number] DOB: 04-Aug-1941 DOA: 09/12/2020 PCP: [name], MD   Brief HPI:   79 year old female with medical history of atrial fibrillation, CAD s/p CABG, breast cancer presented with back pain.  She reportedly was at rehab for recurrent T7 compression fracture when she noticed back pain.  X-ray in the ED showed new T8 compression fracture.  IR was consulted for kyphoplasty.  Patient underwent kyphoplasty with bone biopsy on 09/15/2020.    Subjective   Patient seen and examined, complains of back pain, she says when Dilaudid wears off pain is 10/10 in intensity.  She is getting as needed medications.  Patient did get more than 60 mg of p.o. morphine equivalents in past 24 hours.   Assessment/Plan:     1. T8 compression fracture-patient has history of osteoporosis, and she has been getting high-dose prednisone for past few months which has been reduced to 20 mg p.o. daily for ulcerative colitis.  I feel that patient osteoporosis likely got worse from prednisone therapy which is leading to recurrent compression fractures.  She is s/p kyphoplasty, bone biopsy has been done and is currently pending. 2. Pain control-patient's pain is inadequately controlled with Dilaudid 1 mg IV every 3 hours as needed, Vicodin as needed.  She has received more than 60 mg of p.o. morphine equivalents in past 24 hours.  I will start her on low-dose fentanyl patch 12 mcg every 72 hours, will also start Flexeril 5 mg p.o. 3 times daily.  Continue Dilaudid 1 mg IV every 3 hours as needed. 3. Atrial fibrillation with RVR-patient has permanent atrial fibrillation, continue digoxin, metoprolol.  Anticoagulation with Xarelto. 4. Chronic diastolic CHF-euvolemic. 5. Hyperlipidemia-continue Lipitor 6. Also colitis-continue mesalamine, prednisone.  Patient is on Humira as outpatient.     COVID-19 Labs  No results for input(s): DDIMER, FERRITIN, LDH, CRP in  the last 72 hours.  Lab Results  Component Value Date   SARSCOV2NAA NEGATIVE 09/12/2020   SARSCOV2NAA NEGATIVE 08/22/2020   [place] NEGATIVE 08/16/2020   [place] NEGATIVE 07/10/2020     Scheduled medications:   . allopurinol  200 mg Oral QPM  . atorvastatin  20 mg Oral q1800  . calcium citrate  200 mg of elemental calcium Oral TID  . cyclobenzaprine  5 mg Oral TID  . digoxin  0.125 mg Oral Q MTWThF  . docusate sodium  100 mg Oral BID  . fentaNYL  1 patch Transdermal Q72H  . influenza vaccine adjuvanted  0.5 mL Intramuscular Tomorrow-1000  . Mesalamine  800 mg Oral BID  . metoprolol tartrate  100 mg Oral BID  . predniSONE  20 mg Oral Daily  . Rivaroxaban  15 mg Oral Q supper  . senna-docusate  2 tablet Oral BID  . Vitamin D (Ergocalciferol)  50,000 Units Oral Q7 days         CBG: No results for input(s): GLUCAP in the last 168 hours.  SpO2: 96 % O2 Flow Rate (L/min): 2 L/min    CBC: Recent Labs  Lab 09/12/20 1350 09/13/20 0159 09/15/20 0055  WBC 11.6* 8.5 6.8  NEUTROABS 10.0*  --   --   HGB 12.3 11.9* 11.8*  HCT 38.5 37.1 36.5  MCV 105.5* 105.7* 104.9*  PLT 125* 128* 116*    Basic Metabolic Panel: Recent Labs  Lab 09/12/20 1350 09/13/20 0159 09/15/20 0055  NA 141 140 139  K 3.9 4.0 4.2  CL 108 107 109  CO2 24 23 19*  GLUCOSE 148* 100* 120*  BUN 25* 21 12  CREATININE 0.81 0.65 0.65  CALCIUM 9.1 9.1 9.2     Liver Function Tests: Recent Labs  Lab 09/13/20 0159  AST 26  ALT 29  ALKPHOS 43  BILITOT 1.0  PROT 5.4*  ALBUMIN 2.9*     Antibiotics: Anti-infectives (From admission, onward)   Start     Dose/Rate Route Frequency Ordered Stop   09/15/20 1545  ceFAZolin (ANCEF) IVPB 2g/100 mL premix        2 g 200 mL/hr over 30 Minutes Intravenous  Once 09/15/20 1446 09/15/20 1550   09/15/20 1512  tobramycin (NEBCIN) 1.2 g powder       Note to Pharmacy: Darcel Bayley   : cabinet override      09/15/20 1512 09/16/20 0314       DVT  prophylaxis: Xarelto  Code Status: DNR  Family Communication: Discussed with patient's sister at bedside   Consultants:  IR  Procedures:  Kyphoplasty    Objective   Vitals:   09/16/20 1900 09/17/20 0300 09/17/20 0820 09/17/20 1552  BP: 93/68 112/76 114/68 118/73  Pulse: 87 80 77 72  Resp:   16 16  Temp: 98.9 F (37.2 C) 98.7 F (37.1 C) 97.9 F (36.6 C) 98.2 F (36.8 C)  TempSrc: Oral Oral Oral Oral  SpO2: 99% 98%  96%  Weight:      Height:        Intake/Output Summary (Last 24 hours) at 09/17/2020 1555 Last data filed at 09/17/2020 0820 Gross per 24 hour  Intake 720 ml  Output --  Net 720 ml    10/25 1901 - 10/27 0700 In: 480 [P.O.:480] Out: -   Filed Weights   09/12/20 1054  Weight: 68 kg    Physical Examination:   General-appears in no acute distress Heart-S1-S2, regular, no murmur auscultated Lungs-clear to auscultation bilaterally, no wheezing or crackles auscultated Abdomen-soft, nontender, no organomegaly Extremities-no edema in the lower extremities Neuro-alert, oriented x3, no focal deficit noted  Status is: Inpatient  Dispo: The patient is from: Skilled nursing facility              Anticipated d/c is to: Skilled nursing facility              Anticipated d/c date is: 09/19/2020              Patient currently not medically stable for discharge, does not have adequate pain control with IV opioids.  Barrier to discharge-inadequate pain control, pain medications being adjusted.            Data Reviewed:   Recent Results (from the past 240 hour(s))  Respiratory Panel by RT PCR (Flu A&B, Covid) - Nasopharyngeal Swab     Status: None   Collection Time: 09/12/20  1:52 PM   Specimen: Nasopharyngeal Swab  Result Value Ref Range Status   SARS Coronavirus 2 by RT PCR NEGATIVE NEGATIVE Final    Comment: (NOTE) SARS-CoV-2 target nucleic acids are NOT DETECTED.  The SARS-CoV-2 RNA is generally detectable in upper  respiratoy specimens during the acute phase of infection. The lowest concentration of SARS-CoV-2 viral copies this assay can detect is 131 copies/mL. A negative result does not preclude SARS-Cov-2 infection and should not be used as the sole basis for treatment or other patient management decisions. A negative result may occur with  improper specimen collection/handling, submission of specimen other than nasopharyngeal swab, presence of viral mutation(s) within the areas targeted  by this assay, and inadequate number of viral copies (<131 copies/mL). A negative result must be combined with clinical observations, patient history, and epidemiological information. The expected result is Negative.  Fact Sheet for Patients:  PinkCheek.be  Fact Sheet for Healthcare Providers:  GravelBags.it  This test is no t yet approved or cleared by the [place] FDA and  has been authorized for detection and/or diagnosis of SARS-CoV-2 by FDA under an Emergency Use Authorization (EUA). This EUA will remain  in effect (meaning this test can be used) for the duration of the COVID-19 declaration under Section 564(b)(1) of the Act, 21 U.S.C. section 360bbb-3(b)(1), unless the authorization is terminated or revoked sooner.     Influenza A by PCR NEGATIVE NEGATIVE Final   Influenza B by PCR NEGATIVE NEGATIVE Final    Comment: (NOTE) The Xpert Xpress SARS-CoV-2/FLU/RSV assay is intended as an aid in  the diagnosis of influenza from Nasopharyngeal swab specimens and  should not be used as a sole basis for treatment. Nasal washings and  aspirates are unacceptable for Xpert Xpress SARS-CoV-2/FLU/RSV  testing.  Fact Sheet for Patients: PinkCheek.be  Fact Sheet for Healthcare Providers: GravelBags.it  This test is not yet approved or cleared by the [place] FDA and  has been  authorized for detection and/or diagnosis of SARS-CoV-2 by  FDA under an Emergency Use Authorization (EUA). This EUA will remain  in effect (meaning this test can be used) for the duration of the  Covid-19 declaration under Section 564(b)(1) of the Act, 21  U.S.C. section 360bbb-3(b)(1), unless the authorization is  terminated or revoked. Performed at [facility], [place] [address], [place], [place] [address]     No results for input(s): LIPASE, AMYLASE in the last 168 hours. No results for input(s): AMMONIA in the last 168 hours.  Cardiac Enzymes: No results for input(s): CKTOTAL, CKMB, CKMBINDEX, TROPONINI in the last 168 hours. BNP (last 3 results) Recent Labs    05/17/20 1230 08/16/20 1452  BNP 237.4* 482.0*    ProBNP (last 3 results) No results for input(s): PROBNP in the last 8760 hours.  Studies:  IR VERTEBROPLASTY CERV/THOR BX INC UNI/BIL INC/INJECT/IMAGING  Result Date: 09/17/2020 INDICATION: Severe low thoracic back pain due to compression fracture at T8. EXAM: VERTEBROPLASTY AT T8 MEDICATIONS: As antibiotic prophylaxis, Ancef 2 g IV was ordered pre-procedure and administered intravenously within 1 hour of incision. ANESTHESIA/SEDATION: Moderate (conscious) sedation was employed during this procedure. A total of Versed 1 mg and Fentanyl 25 mcg was administered intravenously. Moderate Sedation Time: 32 minutes. The patient's level of consciousness and vital signs were monitored continuously by radiology nursing throughout the procedure under my direct supervision. FLUOROSCOPY TIME:  Fluoroscopy Time: 6 minutes 30 seconds (175 mGy) COMPLICATIONS: None immediate. TECHNIQUE: Informed written consent was obtained from the patient after a thorough discussion of the procedural risks, benefits and alternatives. All questions were addressed. Maximal Sterile Barrier Technique was utilized including caps, mask, sterile gowns, sterile gloves, sterile drape, hand hygiene and skin  antiseptic. A timeout was performed prior to the initiation of the procedure. PROCEDURE: The patient was placed prone on the fluoroscopic table. Nasal oxygen was administered. Physiologic monitoring was performed throughout the duration of the procedure. The skin overlying the thoracic region was prepped and draped in the usual sterile fashion. The T8 vertebral body was identified and the right pedicle was infiltrated with 0.25% Bupivacaine. This was then followed by the advancement of a 13-gauge Cook needle through the right pedicle  into the posterior one-third at T8. A 16 gauge core biopsy needle was then advanced under biplane intermittent fluoroscopy. A core biopsy sample was obtained and sent for analysis as per request of requesting MD. The 13 gauge Cook spinal needle was then advanced to the anterior 1/3. A gentle contrast injection demonstrated a trabecular pattern of contrast. At this time, methylmethacrylate mixture was reconstituted. Under biplane intermittent fluoroscopy, the methylmethacrylate was then injected into the T8 vertebral body with filling of the vertebral body. No extravasation was noted into the disk spaces or posteriorly into the spinal canal. No epidural venous contamination was seen. The needle was then removed. Hemostasis was achieved at the skin entry site. There were no acute complications. Patient tolerated the procedure well. The patient was transferred to the floor in stable condition. IMPRESSION: 1. Status post vertebral body augmentation for painful compression fracture at T8 using vertebroplasty technique. 2. Status post core biopsy at T8. 3. Core biopsy results to be sent to requesting MD. Electronically Signed   By: Luanne Bras M.D.   On: 09/16/2020 09:19       Barry   [facility] If 7PM-7AM, please contact night-coverage at [email or web address], Office  [phone]   09/17/2020, 3:55 PM  LOS: 4 days

## 2020-09-17 NOTE — Progress Notes (Signed)
Physical Therapy Treatment Patient Details Name: Michele Green MRN: [id number] DOB: 11-09-1941 Today's Date: 09/17/2020    History of Present Illness Pt is 79 yo female with PMH including afib, CAD, CABG, and breast CA.  Pt with recent T7 compression fracture with vertebroplasty last month.  Pt now with new T8 compression fx and s/p vertebroplasty on 09/15/20.    PT Comments    Pt making progress with mobility this afternoon, reporting pain score is improved from AM (pt attempted in AM but pain was too severe), able to progress gait distance to 40f using RW and min guard assist. Pt required minA for supine to EOB transfer using bed rail and needed minA for trunk rise. Pt min guard for sit<>stand from chair/bed heights. Pt with good recall of back precautions and compliant during mobility. Pt continues to benefit from PT services to progress toward functional mobility goals. D/C recs below remain appropriate.   Follow Up Recommendations  Supervision/Assistance - 24 hour;Other (comment);Home health PT (return to ALF)     Equipment Recommendations  None recommended by PT    Recommendations for Other Services       Precautions / Restrictions Precautions Precautions: Back;Fall Precaution Booklet Issued: No Precaution Comments: Pt recalled back precautions without cues (has recently been at rehab for prior vertebroplasty) Restrictions Weight Bearing Restrictions: No    Mobility  Bed Mobility Overal bed mobility: Needs Assistance Bed Mobility: Sidelying to Sit Rolling: Supervision Sidelying to sit: Min assist       General bed mobility comments: assist needed at shoulders to fully rise from sidelying and use of handrail to perform; pt remained up in chair at end of session  Transfers Overall transfer level: Needs assistance Equipment used: Rolling walker (2 wheeled) Transfers: Sit to/from Stand Sit to Stand: Min guard         General transfer comment: min guard for  safety; cues for hand placement  Ambulation/Gait Ambulation/Gait assistance: Min guard Gait Distance (Feet): 90 Feet Assistive device: Rolling walker (2 wheeled) Gait Pattern/deviations: Step-to pattern;Decreased stride length Gait velocity: Decreased Gait velocity interpretation: <1.31 ft/sec, indicative of household ambulator General Gait Details: min cues for RW proximity; min guard for safety   Stairs             Wheelchair Mobility    Modified Rankin (Stroke Patients Only)       Balance Overall balance assessment: Needs assistance Sitting-balance support: No upper extremity supported;Feet supported Sitting balance-Leahy Scale: Good     Standing balance support: Bilateral upper extremity supported Standing balance-Leahy Scale: Poor Standing balance comment: reliant on BUE support on RW in standing                            Cognition Arousal/Alertness: Awake/alert Behavior During Therapy: Anxious Overall Cognitive Status: Within Functional Limits for tasks assessed                         Following Commands: Follows one step commands consistently     Problem Solving: Requires verbal cues        Exercises      General Comments General comments (skin integrity, edema, etc.): HR 100-120 bpm seated in chair after gait trial, HR 60 bpm at rest (unsure of quality of resting pleth reading), SpO2 95% on RA      Pertinent Vitals/Pain Pain Assessment: 0-10 Pain Score: 4  Pain Location: Back Pain Descriptors /  Indicators: Sore Pain Intervention(s): Monitored during session;Premedicated before session;Repositioned    Home Living                      Prior Function            PT Goals (current goals can now be found in the care plan section) Acute Rehab PT Goals Patient Stated Goal: d/c to SNF for short-term rehab before d/cing home PT Goal Formulation: With patient Time For Goal Achievement: 09/30/20 Potential to  Achieve Goals: Good Progress towards PT goals: Progressing toward goals    Frequency    Min 3X/week      PT Plan Current plan remains appropriate    Co-evaluation              AM-PAC PT "6 Clicks" Mobility   Outcome Measure  Help needed turning from your back to your side while in a flat bed without using bedrails?: None Help needed moving from lying on your back to sitting on the side of a flat bed without using bedrails?: A Little Help needed moving to and from a bed to a chair (including a wheelchair)?: A Little Help needed standing up from a chair using your arms (e.g., wheelchair or bedside chair)?: A Little Help needed to walk in hospital room?: A Little Help needed climbing 3-5 steps with a railing? : A Little 6 Click Score: 19    End of Session Equipment Utilized During Treatment: Gait belt Activity Tolerance: Patient tolerated treatment well Patient left: in chair;with chair alarm set;with call bell/phone within reach Nurse Communication: Mobility status PT Visit Diagnosis: Other abnormalities of gait and mobility (R26.89);Muscle weakness (generalized) (M62.81)     Time: 0233-4356 PT Time Calculation (min) (ACUTE ONLY): 14 min  Charges:  $Gait Training: 8-22 mins                     Yani Coventry P., PTA Acute Rehabilitation Services Pager: [phone] Office: [place] 09/17/2020, 2:02 PM

## 2020-09-17 NOTE — Care Management Important Message (Signed)
Important Message  Patient Details  Name: Michele Green MRN: [id number] Date of Birth: 1941/03/11   Medicare Important Message Given:  Yes - Important Message mailed due to current National Emergency  Verbal consent obtained due to current National Emergency  Relationship to patient: Brother/Sister Contact Name: Lurline Del Call Date: 09/17/20  Time: 1046 Phone: [phone] Outcome: Spoke with contact Important Message mailed to: Patient address on file    Delorse Lek 09/17/2020, 10:47 AM

## 2020-09-18 ENCOUNTER — Ambulatory Visit: Payer: Medicare Other | Admitting: Interventional Cardiology

## 2020-09-18 DIAGNOSIS — S22000A Wedge compression fracture of unspecified thoracic vertebra, initial encounter for closed fracture: Secondary | ICD-10-CM | POA: Diagnosis not present

## 2020-09-18 DIAGNOSIS — I4819 Other persistent atrial fibrillation: Secondary | ICD-10-CM | POA: Diagnosis not present

## 2020-09-18 DIAGNOSIS — I10 Essential (primary) hypertension: Secondary | ICD-10-CM | POA: Diagnosis not present

## 2020-09-18 DIAGNOSIS — S22060A Wedge compression fracture of T7-T8 vertebra, initial encounter for closed fracture: Secondary | ICD-10-CM | POA: Diagnosis not present

## 2020-09-18 LAB — SURGICAL PATHOLOGY

## 2020-09-18 NOTE — Progress Notes (Signed)
Physical Therapy Treatment Patient Details Name: Michele Green MRN: [id number] DOB: [birth year] Today's Date: 09/18/2020    History of Present Illness Pt is 79 yo female with PMH including afib, CAD, CABG, and breast CA.  Pt with recent T7 compression fracture with vertebroplasty last month.  Pt now with new T8 compression fx and s/p vertebroplasty on 09/15/20.    PT Comments    Pt participated in ambulation only, pt refused exercises; pt requiring S-min guard during ambulation; pt requesting to return to prior SNF for additional therapy; pt demonstrating deficits in strength, coordination, gait, endurance and pain and will benefit from skilled PT to address deficits prior to discharge home    Follow Up Recommendations  Supervision/Assistance - 24 hour;Other (comment);Home health PT;SNF     Equipment Recommendations  None recommended by PT    Recommendations for Other Services       Precautions / Restrictions Precautions Precautions: Back;Fall Precaution Booklet Issued: No Precaution Comments: pt able to recall 2/3 back precautions but required assist to recall " no twisting" Restrictions Weight Bearing Restrictions: No    Mobility  Bed Mobility Overal bed mobility: Needs Assistance Bed Mobility: Supine to Sit;Sit to Supine     Supine to sit: Min assist Sit to supine: Min assist   General bed mobility comments: pt requiring assist pulling up on therapist for supine>sit; pt requiring assist with guiding B LEs for sit>supine  Transfers Overall transfer level: Needs assistance Equipment used: Rolling walker (2 wheeled) Transfers: Sit to/from Stand Sit to Stand: Supervision         General transfer comment: pt declined  Ambulation/Gait Ambulation/Gait assistance: Supervision;Min guard   Assistive device: Rolling walker (2 wheeled)       General Gait Details: cueing needed to improve proximity to [employer] Mobility     Modified Rankin (Stroke Patients Only)       Balance                                            Cognition Arousal/Alertness: Awake/alert Behavior During Therapy: Anxious (anxious in regards to pain and CLOF) Overall Cognitive Status: Within Functional Limits for tasks assessed                                 General Comments: overall WFL but internally distracted by pain and plan fro DC      Exercises      General Comments General comments (skin integrity, edema, etc.): pt reports concern about DC plan bc she is from ALF and is not sure if she could transition to SNF. pt reports son lives 2 hours away and sister works part time and she doesn't want to ask for help from her unless she has to. provided emotional support concerning DC plan      Pertinent Vitals/Pain Pain Assessment: 0-10 Pain Score: 4  Faces Pain Scale: Hurts even more Pain Location: Back Pain Descriptors / Indicators: Sore;Discomfort;Grimacing Pain Intervention(s): Monitored during session;Limited activity within patient's tolerance    Home Living                      Prior Function            PT  Goals (current goals can now be found in the care plan section) Acute Rehab PT Goals Patient Stated Goal: I want my son to look at the facilities PT Goal Formulation: With patient Time For Goal Achievement: 09/30/20 Potential to Achieve Goals: Good Progress towards PT goals: Progressing toward goals    Frequency    Min 3X/week      PT Plan Current plan remains appropriate    Co-evaluation              AM-PAC PT "6 Clicks" Mobility   Outcome Measure  Help needed turning from your back to your side while in a flat bed without using bedrails?: None Help needed moving from lying on your back to sitting on the side of a flat bed without using bedrails?: A Little Help needed moving to and from a bed to a chair (including a wheelchair)?: A Little Help  needed standing up from a chair using your arms (e.g., wheelchair or bedside chair)?: None Help needed to walk in hospital room?: A Little Help needed climbing 3-5 steps with a railing? : A Little 6 Click Score: 20    End of Session Equipment Utilized During Treatment: Gait belt Activity Tolerance: Patient tolerated treatment well Patient left: in bed;with call bell/phone within reach;with bed alarm set Nurse Communication: Mobility status PT Visit Diagnosis: Other abnormalities of gait and mobility (R26.89);Muscle weakness (generalized) (M62.81)     Time: 5825-1898 PT Time Calculation (min) (ACUTE ONLY): 16 min  Charges:  $Gait Training: 8-22 mins                     Lyanne Co, DPT Acute Rehabilitation Services [phone]   [place] 09/18/2020, 2:19 PM

## 2020-09-18 NOTE — Progress Notes (Signed)
PIV consult: L arm restricted, R forearm with large bruise, tender. No cephalic vein noted on ultrasound. 2.5 inch placed in R brachial vein (0.5cm deep) as pt required IV meds for pain control. Please consider alternative vascular access if prolonged IV medications will be required. RN made aware.

## 2020-09-18 NOTE — Progress Notes (Signed)
PT Cancellation Note  Patient Details Name: Michele Green MRN: [id number] DOB: 06-Oct-1941   Cancelled Treatment:     pt refused therapy stating she didn't sleep well and was in too much pain, will attempt in PM time permitting   Lyanne Co, DPT Acute Rehabilitation Services [phone]   [place] 09/18/2020, 12:56 PM

## 2020-09-18 NOTE — Progress Notes (Signed)
[facility]  PROGRESS NOTE  Michele Green EXB:[id number] DOB: 05-27-1941 DOA: 09/12/2020 PCP: [name], MD   Brief HPI:   79 year old female with medical history of atrial fibrillation, CAD s/p CABG, breast cancer presented with back pain.  She reportedly was at rehab for recurrent T7 compression fracture when she noticed back pain.  X-ray in the ED showed new T8 compression fracture.  IR was consulted for kyphoplasty.  Patient underwent kyphoplasty with bone biopsy on 09/15/2020.    Subjective   Patient seen and examined, upset this morning that she did not sleep well last night.  Does not want to be bothered and want to go back to sleep.   Assessment/Plan:     1. T8 compression fracture-patient has history of osteoporosis, and she has been getting high-dose prednisone for past few months which has been reduced to 20 mg p.o. daily for ulcerative colitis.  I feel that patient osteoporosis likely got worse from prednisone therapy which is leading to recurrent compression fractures.  She is s/p kyphoplasty, bone biopsy has been done and is currently pending. 2. Pain control-patient's pain was inadequately controlled with Dilaudid 1 mg IV every 3 hours as needed, Vicodin as needed.  She has received more than 60 mg of p.o. morphine equivalents so she was started on low-dose fentanyl patch 12 mcg every 72 hours.  Also started on Flexeril 5 mg p.o. 3 times daily.  Continue Dilaudid 1 mg IV every 3 hours as needed. 3. Atrial fibrillation with RVR-patient has permanent atrial fibrillation, continue digoxin, metoprolol.  Anticoagulation with Xarelto. 4. Chronic diastolic CHF-euvolemic. 5. Hyperlipidemia-continue Lipitor 6. Also colitis-continue mesalamine, prednisone.  Patient is on Humira as outpatient.     COVID-19 Labs  No results for input(s): DDIMER, FERRITIN, LDH, CRP in the last 72 hours.  Lab Results  Component Value Date   SARSCOV2NAA NEGATIVE 09/12/2020    SARSCOV2NAA NEGATIVE 08/22/2020   [place] NEGATIVE 08/16/2020   [place] NEGATIVE 07/10/2020     Scheduled medications:   . allopurinol  200 mg Oral QPM  . atorvastatin  20 mg Oral q1800  . calcium citrate  200 mg of elemental calcium Oral TID  . cyclobenzaprine  5 mg Oral TID  . digoxin  0.125 mg Oral Q MTWThF  . docusate sodium  100 mg Oral BID  . fentaNYL  1 patch Transdermal Q72H  . influenza vaccine adjuvanted  0.5 mL Intramuscular Tomorrow-1000  . Mesalamine  800 mg Oral BID  . metoprolol tartrate  100 mg Oral BID  . predniSONE  20 mg Oral Daily  . Rivaroxaban  15 mg Oral Q supper  . senna-docusate  2 tablet Oral BID  . Vitamin D (Ergocalciferol)  50,000 Units Oral Q7 days         CBG: No results for input(s): GLUCAP in the last 168 hours.  SpO2: 100 % O2 Flow Rate (L/min): 2 L/min    CBC: Recent Labs  Lab 09/12/20 1350 09/13/20 0159 09/15/20 0055  WBC 11.6* 8.5 6.8  NEUTROABS 10.0*  --   --   HGB 12.3 11.9* 11.8*  HCT 38.5 37.1 36.5  MCV 105.5* 105.7* 104.9*  PLT 125* 128* 116*    Basic Metabolic Panel: Recent Labs  Lab 09/12/20 1350 09/13/20 0159 09/15/20 0055  NA 141 140 139  K 3.9 4.0 4.2  CL 108 107 109  CO2 24 23 19*  GLUCOSE 148* 100* 120*  BUN 25* 21 12  CREATININE 0.81 0.65 0.65  CALCIUM  9.1 9.1 9.2     Liver Function Tests: Recent Labs  Lab 09/13/20 0159  AST 26  ALT 29  ALKPHOS 43  BILITOT 1.0  PROT 5.4*  ALBUMIN 2.9*     Antibiotics: Anti-infectives (From admission, onward)   Start     Dose/Rate Route Frequency Ordered Stop   09/15/20 1545  ceFAZolin (ANCEF) IVPB 2g/100 mL premix        2 g 200 mL/hr over 30 Minutes Intravenous  Once 09/15/20 1446 09/15/20 1550   09/15/20 1512  tobramycin (NEBCIN) 1.2 g powder       Note to Pharmacy: Darcel Bayley   : cabinet override      09/15/20 1512 09/16/20 0314       DVT prophylaxis: Xarelto  Code Status: DNR  Family Communication: Discussed with patient's  sister at bedside   Consultants:  IR  Procedures:  Kyphoplasty    Objective   Vitals:   09/17/20 1930 09/18/20 0530 09/18/20 0859 09/18/20 1105  BP: (!) 102/59 100/66 105/63 108/76  Pulse: 93 98 (!) 105 88  Resp: 18 19  17   Temp: 98.4 F (36.9 C) 98.3 F (36.8 C) 97.7 F (36.5 C) 97.9 F (36.6 C)  TempSrc: Oral Oral Oral Oral  SpO2: 97% 92% 95% 100%  Weight:      Height:        Intake/Output Summary (Last 24 hours) at 09/18/2020 1148 Last data filed at 09/18/2020 0076 Gross per 24 hour  Intake 680 ml  Output --  Net 680 ml    10/26 1901 - 10/28 0700 In: 960 [P.O.:960] Out: -   Filed Weights   09/12/20 1054  Weight: 68 kg    Physical Examination:   General-appears in no acute distress Heart-S1-S2, regular, no murmur auscultated Lungs-clear to auscultation bilaterally, no wheezing or crackles auscultated Abdomen-soft, nontender, no organomegaly Extremities-no edema in the lower extremities Neuro-alert, oriented x3, no focal deficit noted  Status is: Inpatient  Dispo: The patient is from: Skilled nursing facility              Anticipated d/c is to: Skilled nursing facility              Anticipated d/c date is: 09/19/2020              Patient currently not medically stable for discharge, does not have adequate pain control with IV opioids.  Barrier to discharge-inadequate pain control, pain medications being adjusted.            Data Reviewed:   Recent Results (from the past 240 hour(s))  Respiratory Panel by RT PCR (Flu A&B, Covid) - Nasopharyngeal Swab     Status: None   Collection Time: 09/12/20  1:52 PM   Specimen: Nasopharyngeal Swab  Result Value Ref Range Status   SARS Coronavirus 2 by RT PCR NEGATIVE NEGATIVE Final    Comment: (NOTE) SARS-CoV-2 target nucleic acids are NOT DETECTED.  The SARS-CoV-2 RNA is generally detectable in upper respiratoy specimens during the acute phase of infection. The lowest concentration of  SARS-CoV-2 viral copies this assay can detect is 131 copies/mL. A negative result does not preclude SARS-Cov-2 infection and should not be used as the sole basis for treatment or other patient management decisions. A negative result may occur with  improper specimen collection/handling, submission of specimen other than nasopharyngeal swab, presence of viral mutation(s) within the areas targeted by this assay, and inadequate number of viral copies (<131 copies/mL). A negative result  must be combined with clinical observations, patient history, and epidemiological information. The expected result is Negative.  Fact Sheet for Patients:  PinkCheek.be  Fact Sheet for Healthcare Providers:  GravelBags.it  This test is no t yet approved or cleared by the [place] FDA and  has been authorized for detection and/or diagnosis of SARS-CoV-2 by FDA under an Emergency Use Authorization (EUA). This EUA will remain  in effect (meaning this test can be used) for the duration of the COVID-19 declaration under Section 564(b)(1) of the Act, 21 U.S.C. section 360bbb-3(b)(1), unless the authorization is terminated or revoked sooner.     Influenza A by PCR NEGATIVE NEGATIVE Final   Influenza B by PCR NEGATIVE NEGATIVE Final    Comment: (NOTE) The Xpert Xpress SARS-CoV-2/FLU/RSV assay is intended as an aid in  the diagnosis of influenza from Nasopharyngeal swab specimens and  should not be used as a sole basis for treatment. Nasal washings and  aspirates are unacceptable for Xpert Xpress SARS-CoV-2/FLU/RSV  testing.  Fact Sheet for Patients: PinkCheek.be  Fact Sheet for Healthcare Providers: GravelBags.it  This test is not yet approved or cleared by the [place] FDA and  has been authorized for detection and/or diagnosis of SARS-CoV-2 by  FDA under an Emergency Use  Authorization (EUA). This EUA will remain  in effect (meaning this test can be used) for the duration of the  Covid-19 declaration under Section 564(b)(1) of the Act, 21  U.S.C. section 360bbb-3(b)(1), unless the authorization is  terminated or revoked. Performed at [facility], [place] [address], [place], [place] [address]      BNP (last 3 results) Recent Labs    05/17/20 1230 08/16/20 1452  BNP 237.4* 482.0*    ProBNP (last 3 results) No results for input(s): PROBNP in the last 8760 hours.  Studies:  No results found.     Oswald Hillock   [facility] If 7PM-7AM, please contact night-coverage at [email or web address], Office  [phone]   09/18/2020, 11:48 AM  LOS: 5 days

## 2020-09-18 NOTE — NC FL2 (Signed)
[place] MEDICAID FL2 LEVEL OF CARE SCREENING TOOL     IDENTIFICATION  Patient Name: Michele Green Birthdate: 01/27/1941 Sex: female Admission Date (Current Location): 09/12/2020  [facility], [place] and [place] Number:  Herbalist and Address:  [facility] [place] [facility], [place] [address], [place], [place] [address]      Provider Number: [id number]  Attending Physician Name and Address:  Oswald Hillock, MD  Relative Name and Phone Number:       Current Level of Care:   Recommended Level of Care: [place] Prior Approval Number:    Date Approved/Denied:   PASRR Number: [id number]  Discharge Plan: SNF    Current Diagnoses: Patient Active Problem List   Diagnosis Date Noted  . T8 vertebral fracture ([place]) 09/13/2020  . Compression fracture of C-spine ([place]) 09/12/2020  . Compression fracture of thoracic spine, non-traumatic ([place]) 09/12/2020  . Goals of care, counseling/discussion   . Palliative care by specialist   . Spinal fracture of T7 vertebra ([place]) 08/17/2020  . Atrial fibrillation with RVR ([place]) 08/16/2020  . Long term (current) use of anticoagulants 08/28/2018  . Atrial flutter with rapid ventricular response ([place]) 08/14/2018  . 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 S/P Maze operation for atrial fibrillation 08/10/2018  . Chronic diastolic HF (heart failure) ([place]) 06/20/2018  . Osteoporosis 09/29/2017  . Loosening of hardware in spine ([place]) 08/23/2017  . Spinal stenosis of lumbar region 08/11/2016  . Unspecified cord compression ([place]) 08/11/2016  . Unstable burst fracture of third lumbar vertebra ([place]) 08/11/2016  . Genetic testing 03/17/2016  . Family history of breast cancer   . Breast cancer of upper-outer quadrant of left female breast ([place]) 02/02/2016  . On amiodarone therapy 09/09/2015  . On continuous oral anticoagulation 08/22/2015  . Persistent atrial fibrillation ([place]) 08/22/2015  . Essential  hypertension 11/07/2013  . Mitral regurgitation 11/07/2013    Orientation RESPIRATION BLADDER Height & Weight     Self, Situation, Place, Time  Normal Continent Weight: 150 lb (68 kg) Height:  5' 4"  (162.6 cm)  BEHAVIORAL SYMPTOMS/MOOD NEUROLOGICAL BOWEL NUTRITION STATUS      Continent Diet (See discharge summary)  AMBULATORY STATUS COMMUNICATION OF NEEDS Skin   Extensive Assist Verbally Normal                       Personal Care Assistance Level of Assistance  Bathing, Feeding, Dressing   Feeding assistance: Independent Dressing Assistance: Maximum assistance     Functional Limitations Info  Sight, Hearing, Speech Sight Info: Adequate Hearing Info: Adequate Speech Info: Adequate    SPECIAL CARE FACTORS FREQUENCY  PT (By licensed PT), OT (By licensed OT)     PT Frequency: 5x a week OT Frequency: 5x a week            Contractures Contractures Info: Not present    Additional Factors Info  Code Status, Allergies Code Status Info: Full Allergies Info: Shellfish           Current Medications (09/18/2020):  This is the current hospital active medication list Current Facility-Administered Medications  Medication Dose Route Frequency Provider Last Rate Last Admin  . acetaminophen (TYLENOL) tablet 650 mg  650 mg Oral Q6H PRN Oswald Hillock, MD   650 mg at 09/17/20 2152  . allopurinol (ZYLOPRIM) tablet 200 mg  200 mg Oral QPM Darliss Cheney, MD   200 mg at 09/17/20 1703  .  atorvastatin (LIPITOR) tablet 20 mg  20 mg Oral q1800 Darliss Cheney, MD   20 mg at 09/17/20 1703  . bisacodyl (DULCOLAX) suppository 10 mg  10 mg Rectal Daily PRN Marylyn Ishihara, Tyrone A, DO      . calcium citrate (CALCITRATE - dosed in mg elemental calcium) tablet 200 mg of elemental calcium  200 mg of elemental calcium Oral TID Marylyn Ishihara, Tyrone A, DO   200 mg of elemental calcium at 09/18/20 0954  . cyclobenzaprine (FLEXERIL) tablet 5 mg  5 mg Oral TID Oswald Hillock, MD   5 mg at 09/18/20 0954  . digoxin  (LANOXIN) tablet 0.125 mg  0.125 mg Oral Q MTWThF Kyle, Tyrone A, DO   0.125 mg at 09/18/20 1005  . docusate sodium (COLACE) capsule 100 mg  100 mg Oral BID Marylyn Ishihara, Tyrone A, DO   100 mg at 09/15/20 2208  . fentaNYL (DURAGESIC) 12 MCG/HR 1 patch  1 patch Transdermal Q72H Oswald Hillock, MD   1 patch at 09/17/20 1418  . HYDROmorphone (DILAUDID) injection 1 mg  1 mg Intravenous Q3H PRN Dessa Phi, DO   1 mg at 09/18/20 1000  . influenza vaccine adjuvanted (FLUAD) injection 0.5 mL  0.5 mL Intramuscular Tomorrow-1000 Kyle, Tyrone A, DO      . Mesalamine (ASACOL) DR capsule 800 mg  800 mg Oral BID Dessa Phi, DO   800 mg at 09/18/20 0954  . metoprolol tartrate (LOPRESSOR) tablet 100 mg  100 mg Oral BID Marylyn Ishihara, Tyrone A, DO   100 mg at 09/18/20 0954  . ondansetron (ZOFRAN) tablet 4 mg  4 mg Oral Q6H PRN Marylyn Ishihara, Tyrone A, DO       Or  . ondansetron (ZOFRAN) injection 4 mg  4 mg Intravenous Q6H PRN Marylyn Ishihara, Tyrone A, DO      . predniSONE (DELTASONE) tablet 20 mg  20 mg Oral Daily Dessa Phi, DO   20 mg at 09/18/20 0954  . Rivaroxaban (XARELTO) tablet 15 mg  15 mg Oral Q supper Dessa Phi, DO   15 mg at 09/17/20 1655  . senna-docusate (Senokot-S) tablet 2 tablet  2 tablet Oral BID Marylyn Ishihara, Tyrone A, DO   2 tablet at 09/15/20 2208  . Vitamin D (Ergocalciferol) (DRISDOL) capsule 50,000 Units  50,000 Units Oral Q7 days Cherylann Ratel A, DO   50,000 Units at 09/13/20 5409     Discharge Medications: Please see discharge summary for a list of discharge medications.  Relevant Imaging Results:  Relevant Lab Results:   Additional Information SSN: [id number]  Emeterio Reeve, [place]

## 2020-09-18 NOTE — Progress Notes (Signed)
Occupational Therapy Treatment Patient Details Name: Michele Green MRN: [id number] DOB: 1940-12-27 Today's Date: 09/18/2020    History of present illness Pt is 79 yo female with PMH including afib, CAD, CABG, and breast CA.  Pt with recent T7 compression fracture with vertebroplasty last month.  Pt now with new T8 compression fx and s/p vertebroplasty on 09/15/20.   OT comments  Pt limited by pain this session declining OOB mobility. Pt currently requires s/u for UB ADLs from upright position in bed. Pt able to state 2/3 back precautions, reviewed compensatory methods for maintaining back precautions during ADLs with pt verbalizing understanding. Pt seemed internally distracted by pain and DC plan as pt wants to return to ALF but isn't sure she has enough support( see general comments for more info). Pt would continue to benefit from skilled occupational therapy while admitted and after d/c to address the below listed limitations in order to improve overall functional mobility and facilitate independence with BADL participation. DC plan remains appropriate, will follow acutely per POC.    Follow Up Recommendations  SNF;Supervision/Assistance - 24 hour;Home health OT    Equipment Recommendations  None recommended by OT    Recommendations for Other Services      Precautions / Restrictions Precautions Precautions: Back;Fall Precaution Booklet Issued: No Precaution Comments: pt able to recall 2/3 back precautions but required assist to recall " no twisting" Restrictions Weight Bearing Restrictions: No       Mobility Bed Mobility               General bed mobility comments: pt declined  Transfers                 General transfer comment: pt declined    Balance                                           ADL either performed or assessed with clinical judgement   ADL Overall ADL's : Needs assistance/impaired     Grooming: Wash/dry face;Oral  care;Sitting;Set up Grooming Details (indicate cue type and reason): set- up assist from upright position in bed               Lower Body Dressing Details (indicate cue type and reason): reviewed compensatory methods for LB dressing   Toilet Transfer Details (indicate cue type and reason): pt declined OOB transfer secondary to pain           General ADL Comments: pt limited by pain this session declining OOB mobility. pt continues to present with pain, decreased activity toleranc and back precautions impacting pts ability to complete BADLs     Vision       Perception     Praxis      Cognition Arousal/Alertness: Awake/alert Behavior During Therapy: Anxious (anxious in regards to pain and CLOF) Overall Cognitive Status: Within Functional Limits for tasks assessed                                 General Comments: overall WFL but internally distracted by pain and plan fro DC        Exercises     Shoulder Instructions       General Comments pt reports concern about DC plan bc she is from ALF and is not sure if she could  transition to their SNF. pt reports son lives 2 hours away and sister works part time and she doesn't want to ask for help from her unless she has to. provided emotional support concerning DC plan    Pertinent Vitals/ Pain       Pain Assessment: Faces Faces Pain Scale: Hurts even more Pain Location: Back Pain Descriptors / Indicators: Sore;Discomfort;Grimacing Pain Intervention(s): Monitored during session;Limited activity within patient's tolerance  Home Living                                          Prior Functioning/Environment              Frequency  Min 2X/week        Progress Toward Goals  OT Goals(current goals can now be found in the care plan section)  Progress towards OT goals: Progressing toward goals  Acute Rehab OT Goals Patient Stated Goal: d/c to SNF for short-term rehab before d/cing  home OT Goal Formulation: With patient Time For Goal Achievement: 10/01/20 Potential to Achieve Goals: Good  Plan Discharge plan remains appropriate;Frequency remains appropriate    Co-evaluation                 AM-PAC OT "6 Clicks" Daily Activity     Outcome Measure   Help from another person eating meals?: None Help from another person taking care of personal grooming?: A Little Help from another person toileting, which includes using toliet, bedpan, or urinal?: A Little Help from another person bathing (including washing, rinsing, drying)?: A Little Help from another person to put on and taking off regular upper body clothing?: A Little Help from another person to put on and taking off regular lower body clothing?: A Little 6 Click Score: 19    End of Session    OT Visit Diagnosis: Unsteadiness on feet (R26.81);Other abnormalities of gait and mobility (R26.89);Muscle weakness (generalized) (M62.81);History of falling (Z91.81);Dizziness and giddiness (R42);Pain Pain - part of body:  (back)   Activity Tolerance Patient limited by pain   Patient Left in bed;with call bell/phone within reach   Nurse Communication          Time: 5284-1324 OT Time Calculation (min): 11 min  Charges: OT General Charges $OT Visit: 1 Visit OT Treatments $Self Care/Home Management : 8-22 mins  [place]anier Clam., COTA/L Acute Rehabilitation Services [phone]    Michele Green 09/18/2020, 11:24 AM

## 2020-09-18 NOTE — TOC Progression Note (Signed)
Transition of Care [facility]) - Progression Note    Patient Details  Name: Michele Green MRN: [id number] Date of Birth: August 31, 1941  Transition of Care [facility]) CM/SW [place], [place] Phone Number: 09/18/2020, 1:52 PM  Clinical Narrative:     CSW gave pt bed offers. Pt reports she wants to return to [facility] because she was recently there. CSW reached out to [facility] and is waiting on them to confirm the can take pt.  Expected Discharge Plan: [place] Barriers to Discharge: Continued Medical Work up  Expected Discharge Plan and Services Expected Discharge Plan: [place] arrangements for the past 2 months: [place]                                       Social Determinants of Health (SDOH) Interventions    Readmission Risk Interventions No flowsheet data found.  Michele Green, Michele Green, [place] Social Worker [phone]

## 2020-09-19 DIAGNOSIS — S22000A Wedge compression fracture of unspecified thoracic vertebra, initial encounter for closed fracture: Secondary | ICD-10-CM | POA: Diagnosis not present

## 2020-09-19 DIAGNOSIS — I4819 Other persistent atrial fibrillation: Secondary | ICD-10-CM | POA: Diagnosis not present

## 2020-09-19 DIAGNOSIS — I10 Essential (primary) hypertension: Secondary | ICD-10-CM | POA: Diagnosis not present

## 2020-09-19 DIAGNOSIS — S22060A Wedge compression fracture of T7-T8 vertebra, initial encounter for closed fracture: Secondary | ICD-10-CM | POA: Diagnosis not present

## 2020-09-19 LAB — RESPIRATORY PANEL BY RT PCR (FLU A&B, COVID)
Influenza A by PCR: NEGATIVE
Influenza B by PCR: NEGATIVE
SARS Coronavirus 2 by RT PCR: NEGATIVE

## 2020-09-19 MED ORDER — OXYCODONE HCL 5 MG PO TABS: 5.0000 mg | ORAL_TABLET | ORAL | Status: DC | PRN

## 2020-09-19 MED ORDER — BISACODYL 10 MG RE SUPP: 10.0000 mg | Freq: Every day | RECTAL | 0 refills | Status: DC | PRN

## 2020-09-19 MED ORDER — FENTANYL 12 MCG/HR TD PT72
1.0000 | MEDICATED_PATCH | TRANSDERMAL | 0 refills | Status: DC
Start: 2020-09-20 — End: 2020-12-09

## 2020-09-19 MED ORDER — METOPROLOL TARTRATE 50 MG PO TABS: 75.0000 mg | ORAL_TABLET | Freq: Two times a day (BID) | ORAL | Status: DC

## 2020-09-19 MED ORDER — DOCUSATE SODIUM 100 MG PO CAPS
100.0000 mg | ORAL_CAPSULE | Freq: Two times a day (BID) | ORAL | 0 refills | Status: DC
Start: 2020-09-19 — End: 2021-01-28

## 2020-09-19 MED ORDER — SODIUM CHLORIDE 0.9 % IV SOLN
INTRAVENOUS | Status: DC
Start: 2020-09-19 — End: 2020-09-19

## 2020-09-19 MED ORDER — OXYCODONE HCL 5 MG PO TABS: 5.0000 mg | ORAL_TABLET | Freq: Four times a day (QID) | ORAL | 0 refills | Status: DC | PRN

## 2020-09-19 MED ORDER — CYCLOBENZAPRINE HCL 5 MG PO TABS: 5.0000 mg | ORAL_TABLET | Freq: Three times a day (TID) | ORAL | 0 refills | Status: DC | PRN

## 2020-09-19 MED ORDER — SODIUM CHLORIDE 0.9 % IV SOLN: INTRAVENOUS | Status: DC

## 2020-09-19 NOTE — Progress Notes (Signed)
Physical Therapy Treatment Patient Details Name: Michele Green MRN: [id number] DOB: October 07, 1941 Today's Date: 09/19/2020    History of Present Illness Pt is an 79 y.o. female admitted 09/12/20 with acute T8 compression fx; s/p vertebroplasty 10/25. Of note, pt with recent T7 compression fx s/p vertebroplasty 07/2020. Other PMH includes afib, CAD s/p CABG, breast CA.   PT Comments    Pt preparing for d/c to SNF this afternoon. Today's session focused on transfer and gait training with RW; pt demonstrates poor attention, requiring cues to maintain back precautions and attend to task; seems internally distracted by d/c plans. Remains limited by generalized weakness, decreased activity tolerance, pain and impaired cognition; at high risk for falls. Continue to recommend SNF-level therapies to maximize functional mobility and independence.    Follow Up Recommendations  SNF;Supervision for mobility/OOB     Equipment Recommendations  None recommended by PT    Recommendations for Other Services       Precautions / Restrictions Precautions Precautions: Back;Fall Restrictions Weight Bearing Restrictions: No    Mobility  Bed Mobility Overal bed mobility: Needs Assistance Bed Mobility: Rolling;Sidelying to Sit Rolling: Supervision Sidelying to sit: Min assist       General bed mobility comments: Cues for log roll, minA for HHA to elevate trunk; reliant on bed rail  Transfers Overall transfer level: Needs assistance Equipment used: Rolling walker (2 wheeled) Transfers: Sit to/from Stand Sit to Stand: Min guard         General transfer comment: Cues for hand placement, increased time preparing to stand  Ambulation/Gait Ambulation/Gait assistance: Supervision;Min guard Gait Distance (Feet): 120 Feet Assistive device: Rolling walker (2 wheeled) Gait Pattern/deviations: Step-through pattern;Decreased stride length Gait velocity: Decreased   General Gait Details: Intermittent  min guard for balance; cues to maintain proximity to RW and maintain back precautions   Stairs             Wheelchair Mobility    Modified Rankin (Stroke Patients Only)       Balance Overall balance assessment: Needs assistance Sitting-balance support: No upper extremity supported;Feet supported Sitting balance-Leahy Scale: Good     Standing balance support: Bilateral upper extremity supported Standing balance-Leahy Scale: Poor Standing balance comment: Reliant on UE support                            Cognition Arousal/Alertness: Awake/alert Behavior During Therapy: WFL for tasks assessed/performed Overall Cognitive Status: No family/caregiver present to determine baseline cognitive functioning Area of Impairment: Attention;Awareness                   Current Attention Level: Selective       Awareness: Emergent   General Comments: Pt talking fast requiring cues to attend to one topic/task before moving to next; multiple questions      Exercises      General Comments General comments (skin integrity, edema, etc.): Preparing for d/c to SNF this afternoon      Pertinent Vitals/Pain Pain Assessment: Faces Faces Pain Scale: Hurts a little bit Pain Location: Back Pain Descriptors / Indicators: Sore;Discomfort Pain Intervention(s): Monitored during session    Home Living                      Prior Function            PT Goals (current goals can now be found in the care plan section) Progress towards PT goals: Progressing toward  goals    Frequency    Min 3X/week      PT Plan Current plan remains appropriate    Co-evaluation              AM-PAC PT "6 Clicks" Mobility   Outcome Measure  Help needed turning from your back to your side while in a flat bed without using bedrails?: A Little Help needed moving from lying on your back to sitting on the side of a flat bed without using bedrails?: A Little Help needed  moving to and from a bed to a chair (including a wheelchair)?: A Little Help needed standing up from a chair using your arms (e.g., wheelchair or bedside chair)?: A Little Help needed to walk in hospital room?: A Little Help needed climbing 3-5 steps with a railing? : A Little 6 Click Score: 18    End of Session Equipment Utilized During Treatment: Gait belt Activity Tolerance: Patient tolerated treatment well Patient left: in chair;with call bell/phone within reach;with chair alarm set;with nursing/sitter in room Nurse Communication: Mobility status PT Visit Diagnosis: Other abnormalities of gait and mobility (R26.89);Muscle weakness (generalized) (M62.81)     Time: 8727-6184 PT Time Calculation (min) (ACUTE ONLY): 19 min  Charges:  $Gait Training: 8-22 mins                    Michele Green, PT, DPT Acute Rehabilitation Services  Pager [phone] Office [place] 09/19/2020, 5:49 PM

## 2020-09-19 NOTE — Discharge Summary (Signed)
Physician Discharge Summary  Michele Green DQQ:[id number] DOB: [birth year] DOA: 09/12/2020  PCP: [name], MD  Admit date: 09/12/2020 Discharge date: 09/19/2020  Time spent: 50* minutes  Recommendations for Outpatient Follow-up:  1. Continue physical therapy at skilled facility 2. Pain control-started on fentanyl patch 12 mcg every 72 hours, oxycodone 5 mg every 6 hours as needed, Flexeril 5 mg p.o. 3 times daily as needed for muscle spasm. 3. Follow-up gastroenterology as outpatient for ulcerative colitis.  Patient follows Dr. Acie Fredrickson [facility] 4. Oncology recommended to hold tamoxifen starting from 08-23-20 till 10-23-20 to prevent DVTs.  Discharge Diagnoses:  Principal Problem:   Compression fracture of thoracic spine, non-traumatic (HCC) Active Problems:   Essential hypertension   On continuous oral anticoagulation   Persistent atrial fibrillation (HCC)   Chronic diastolic HF (heart failure) (HCC)   T8 vertebral fracture (HCC)   Discharge Condition: Stable  Diet recommendation: Heart healthy diet  Filed Weights   09/12/20 1054  Weight: 68 kg    History of present illness:  79 year old female with medical history of atrial fibrillation, CAD s/p CABG, breast cancer presented with back pain.  She reportedly was at rehab for recurrent T7 compression fracture when she noticed back pain.  X-ray in the ED showed new T8 compression fracture.  IR was consulted for kyphoplasty.  Patient underwent kyphoplasty with bone biopsy on 09/15/2020.  Hospital Course:   1. T8 compression fracture-patient has history of osteoporosis, and she has been getting high-dose prednisone for past few months which has been reduced to 20 mg p.o. daily for ulcerative colitis.  I feel that patient osteoporosis likely got worse from prednisone therapy which is leading to recurrent compression fractures.  She is s/p kyphoplasty, bone biopsy was done which showed blood and hematopoietic marrow tissue.  No  malignancy identified. 2. Pain control-patient's pain was inadequately controlled with Dilaudid 1 mg IV every 3 hours as needed, Vicodin as needed.  She has received more than 60 mg of p.o. morphine equivalents so she was started on low-dose fentanyl patch 12 mcg every 72 hours.  Also started on Flexeril 5 mg p.o. 3 times daily.    Will start oxycodone 5 mg p.o. every 6 hours as needed for breakthrough pain.   3. Atrial fibrillation with RVR-patient has permanent atrial fibrillation, continue digoxin, metoprolol.  Anticoagulation with Xarelto. 4. Chronic diastolic CHF-euvolemic. 5. Hyperlipidemia-continue Lipitor 6. Ulcerative colitis-continue mesalamine, prednisone.  Patient is on Humira as outpatient.  Follow-up with gastroenterology as outpatient 7. History of breast cancer-tamoxifen is currently on hold at least for 2 months as per oncology recommendation.  Hold tamoxifen for now.    Procedures:  Kyphoplasty  Consultations:  IR  Discharge Exam: Vitals:   09/19/20 0730 09/19/20 1356  BP: 103/75 114/65  Pulse: 90 (!) 105  Resp: 17   Temp: 98 F (36.7 C) (!) 97.5 F (36.4 C)  SpO2: 93%     General: Appears in no acute distress Cardiovascular: S1-S2, regular, no murmur auscultated Respiratory: Clear to auscultation bilaterally  Discharge Instructions   Discharge Instructions    Diet - low sodium heart healthy   Complete by: As directed    Increase activity slowly   Complete by: As directed    No wound care   Complete by: As directed      Allergies as of 09/19/2020      Reactions   Shellfish Allergy Itching      Medication List    STOP taking these medications  nitrofurantoin 50 MG capsule Commonly known as: MACRODANTIN   oxyCODONE-acetaminophen 5-325 MG tablet Commonly known as: PERCOCET/ROXICET   traMADol 50 MG tablet Commonly known as: ULTRAM     TAKE these medications   acetaminophen 500 MG tablet Commonly known as: TYLENOL Take 500-1,000 mg by  mouth every 6 (six) hours as needed for moderate pain or headache.   allopurinol 100 MG tablet Commonly known as: ZYLOPRIM Take 200 mg by mouth every evening.   atorvastatin 20 MG tablet Commonly known as: LIPITOR TAKE 1 TABLET BY MOUTH EVERY DAY   bisacodyl 10 MG suppository Commonly known as: DULCOLAX Place 1 suppository (10 mg total) rectally daily as needed for moderate constipation.   cyclobenzaprine 5 MG tablet Commonly known as: FLEXERIL Take 1 tablet (5 mg total) by mouth 3 (three) times daily as needed for muscle spasms.   digoxin 0.125 MG tablet Commonly known as: LANOXIN Take one tablet by mouth daily Monday through Friday.  Do NOT take on Saturday or Sunday.   docusate sodium 100 MG capsule Commonly known as: COLACE Take 1 capsule (100 mg total) by mouth 2 (two) times daily.   feeding supplement Liqd Take 237 mLs by mouth 2 (two) times daily between meals.   fentaNYL 12 MCG/HR Commonly known as: [place] 1 patch onto the skin every 3 (three) days. Start taking on: September 20, 2020   Humira Pen 40 MG/0.4ML Pnkt Generic drug: Adalimumab Inject 40 mg into the skin every 14 (fourteen) days.   mesalamine 0.375 g 24 hr capsule Commonly known as: APRISO Take 1.5 g by mouth daily.   metoprolol tartrate 100 MG tablet Commonly known as: LOPRESSOR TAKE 1 TABLET BY MOUTH TWICE A DAY   multivitamin with minerals Tabs tablet Take 1 tablet by mouth daily.   oxyCODONE 5 MG immediate release tablet Commonly known as: Oxy IR/ROXICODONE Take 1 tablet (5 mg total) by mouth every 6 (six) hours as needed for severe pain.   potassium chloride SA 20 MEQ tablet Commonly known as: [place]-Con M20 Take 1 tablet (20 mEq total) by mouth daily as needed. Take only when taking a Lasix   predniSONE 10 MG tablet Commonly known as: DELTASONE Take 20 mg by mouth daily.   tamoxifen 20 MG tablet Commonly known as: NOLVADEX Take 20 mg by mouth daily.   Xarelto 15 MG Tabs  tablet Generic drug: Rivaroxaban TAKE 1 TABLET BY MOUTH  DAILY WITH SUPPER What changed: See the new instructions.      Allergies  Allergen Reactions  . Shellfish Allergy Itching    Contact information for after-discharge care    Destination    [facility]erred [facility] .   Service: Skilled Nursing Contact information: [place] [phone]                   The results of significant diagnostics from this hospitalization (including imaging, microbiology, ancillary and laboratory) are listed below for reference.    Significant Diagnostic Studies: DG Chest 2 View  Result Date: 09/12/2020 CLINICAL DATA:  Back pain. Additional provided: Patient reports upper and lower back pain down center of spine, pain does not radiate. EXAM: CHEST - 2 VIEW COMPARISON:  Prior chest radiographs 08/16/2020 and earlier. FINDINGS: Prior median sternotomy/CABG. Prosthetic cardiac valve. Left atrial appendage clip. Unchanged cardiomegaly. Aortic atherosclerosis. There is no appreciable airspace consolidation or pulmonary edema. Minimal bibasilar atelectasis. No evidence of pleural effusion or pneumothorax. Severe acute/subacute T8 vertebral compression fracture  as described on the concurrently performed thoracic spine radiographs. Severe chronic T7 compression fracture status post vertebral augmentation. IMPRESSION: Severe acute/subacute T8 vertebral compression fracture as described on the concurrently performed thoracic spine radiographs. Minimal bibasilar atelectasis. Unchanged cardiomegaly. Aortic Atherosclerosis (ICD10-I70.0). Electronically Signed   By: Kellie Simmering DO   On: 09/12/2020 11:05   DG Thoracic Spine 2 View  Result Date: 09/12/2020 CLINICAL DATA:  Back pain. Additional history provided: Patient reports upper and lower back pain down center of spine, pain non radiating. EXAM: THORACIC SPINE 2 VIEWS COMPARISON:  MRI thoracic spine  08/16/2020. FINDINGS: No significant spondylolisthesis. Exaggerated thoracic kyphosis centered at the T7-T8 level (at sites of severe compression fractures). Mild lower thoracic levocurvature Redemonstrated severe chronic T7 vertebral compression fracture status post vertebral augmentation. New as compared to the prior MRI of 08/16/2020, there is a severe T8 vertebral compression fracture. Mild multilevel disc space narrowing. IMPRESSION: Severe T8 vertebral compression fracture, new as compared to the thoracic spine MRI of 08/16/2020, acute/subacute. Redemonstrated severe chronic T7 vertebral compression fracture status post vertebral augmentation. Exaggerated thoracic kyphosis centered at the T7-T8 level. Mild lower thoracic levocurvature. Electronically Signed   By: Kellie Simmering DO   On: 09/12/2020 10:56   DG Lumbar Spine Complete  Result Date: 09/12/2020 CLINICAL DATA:  Back pain. Additional history provided: Patient reports upper and lower back pain down the center of the spine, nonradiating. EXAM: LUMBAR SPINE - COMPLETE 4+ VIEW COMPARISON:  CT abdomen/pelvis 05/22/2020. FINDINGS: Five lumbar vertebrae. Lumbar dextrocurvature. Unchanged bony retropulsion at the L2 and L3 levels. Redemonstrated moderate chronic L2 vertebral compression fracture. Redemonstrated severe chronic L3 vertebral compression fracture. No acute vertebral compression deformity is identified. Multilevel disc space narrowing. Most notably, there is moderate/severe disc space narrowing at L2-L3. Mild multilevel facet arthrosis. Aortic atherosclerosis. IMPRESSION: No evidence of acute lumbar compression fracture. Redemonstrated chronic L2 and L3 vertebral compression fractures with associated bony retropulsion. Lumbar spondylosis as described. Lumbar dextrocurvature. Aortic Atherosclerosis (ICD10-I70.0). Electronically Signed   By: Kellie Simmering DO   On: 09/12/2020 11:01   CT Thoracic Spine Wo Contrast  Result Date:  09/12/2020 CLINICAL DATA:  Thoracic compression fracture.  Back pain. EXAM: CT THORACIC SPINE WITHOUT CONTRAST TECHNIQUE: Multidetector CT images of the thoracic were obtained using the standard protocol without intravenous contrast. COMPARISON:  MRI thoracic spine 08/16/2020 FINDINGS: Alignment: Normal Vertebrae: Severe compression fracture of T7 vertebral body with vertebroplasty cement. Cement in satisfactory position. No spinal stenosis Moderate compression fracture of T8 is new since the prior MRI. There is retropulsion of posterior cortex into the canal without significant stenosis. No other fracture or mass lesion identified. 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 Coronary artery calcification. Atherosclerotic calcification aortic arch. 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 Disc levels: Disc spaces maintained in the thoracic spine without significant degenerative change. IMPRESSION: Acute fracture of T8, new since the recent MRI of 08/16/2020. Retropulsion of the posterior cortex into the spinal canal without significant spinal stenosis. Satisfactory cement vertebroplasty at T7 for severe fracture. Electronically Signed   By: Franchot Gallo M.D.   On: 09/12/2020 15:40   IR VERTEBROPLASTY CERV/THOR BX INC UNI/BIL INC/INJECT/IMAGING  Result Date: 09/17/2020 INDICATION: Severe low thoracic back pain due to compression fracture at T8. EXAM: VERTEBROPLASTY AT T8 MEDICATIONS: As antibiotic prophylaxis, Ancef 2 g IV was ordered pre-procedure and administered intravenously within 1 hour of incision. ANESTHESIA/SEDATION: Moderate (conscious) sedation was employed during this procedure. A total of Versed 1  mg and Fentanyl 25 mcg was administered intravenously. Moderate Sedation Time: 32 minutes. The patient's level of consciousness and vital signs were monitored continuously by radiology nursing throughout the procedure under my direct supervision. FLUOROSCOPY  TIME:  Fluoroscopy Time: 6 minutes 30 seconds (950 mGy) COMPLICATIONS: None immediate. TECHNIQUE: Informed written consent was obtained from the patient after a thorough discussion of the procedural risks, benefits and alternatives. All questions were addressed. Maximal Sterile Barrier Technique was utilized including caps, mask, sterile gowns, sterile gloves, sterile drape, hand hygiene and skin antiseptic. A timeout was performed prior to the initiation of the procedure. PROCEDURE: The patient was placed prone on the fluoroscopic table. Nasal oxygen was administered. Physiologic monitoring was performed throughout the duration of the procedure. The skin overlying the thoracic region was prepped and draped in the usual sterile fashion. The T8 vertebral body was identified and the right pedicle was infiltrated with 0.25% Bupivacaine. This was then followed by the advancement of a 13-gauge Cook needle through the right pedicle into the posterior one-third at T8. A 16 gauge core biopsy needle was then advanced under biplane intermittent fluoroscopy. A core biopsy sample was obtained and sent for analysis as per request of requesting MD. The 13 gauge Cook spinal needle was then advanced to the anterior 1/3. A gentle contrast injection demonstrated a trabecular pattern of contrast. At this time, methylmethacrylate mixture was reconstituted. Under biplane intermittent fluoroscopy, the methylmethacrylate was then injected into the T8 vertebral body with filling of the vertebral body. No extravasation was noted into the disk spaces or posteriorly into the spinal canal. No epidural venous contamination was seen. The needle was then removed. Hemostasis was achieved at the skin entry site. There were no acute complications. Patient tolerated the procedure well. The patient was transferred to the floor in stable condition. IMPRESSION: 1. Status post vertebral body augmentation for painful compression fracture at T8 using  vertebroplasty technique. 2. Status post core biopsy at T8. 3. Core biopsy results to be sent to requesting MD. Electronically Signed   By: Luanne Bras M.D.   On: 09/16/2020 09:19    Microbiology: Recent Results (from the past 240 hour(s))  Respiratory Panel by RT PCR (Flu A&B, Covid) - Nasopharyngeal Swab     Status: None   Collection Time: 09/12/20  1:52 PM   Specimen: Nasopharyngeal Swab  Result Value Ref Range Status   SARS Coronavirus 2 by RT PCR NEGATIVE NEGATIVE Final    Comment: (NOTE) SARS-CoV-2 target nucleic acids are NOT DETECTED.  The SARS-CoV-2 RNA is generally detectable in upper respiratoy specimens during the acute phase of infection. The lowest concentration of SARS-CoV-2 viral copies this assay can detect is 131 copies/mL. A negative result does not preclude SARS-Cov-2 infection and should not be used as the sole basis for treatment or other patient management decisions. A negative result may occur with  improper specimen collection/handling, submission of specimen other than nasopharyngeal swab, presence of viral mutation(s) within the areas targeted by this assay, and inadequate number of viral copies (<131 copies/mL). A negative result must be combined with clinical observations, patient history, and epidemiological information. The expected result is Negative.  Fact Sheet for Patients:  PinkCheek.be  Fact Sheet for Healthcare Providers:  GravelBags.it  This test is no t yet approved or cleared by the [place] FDA and  has been authorized for detection and/or diagnosis of SARS-CoV-2 by FDA under an Emergency Use Authorization (EUA). This EUA will remain  in effect (meaning this test  can be used) for the duration of the COVID-19 declaration under Section 564(b)(1) of the Act, 21 U.S.C. section 360bbb-3(b)(1), unless the authorization is terminated or revoked sooner.     Influenza A by  PCR NEGATIVE NEGATIVE Final   Influenza B by PCR NEGATIVE NEGATIVE Final    Comment: (NOTE) The Xpert Xpress SARS-CoV-2/FLU/RSV assay is intended as an aid in  the diagnosis of influenza from Nasopharyngeal swab specimens and  should not be used as a sole basis for treatment. Nasal washings and  aspirates are unacceptable for Xpert Xpress SARS-CoV-2/FLU/RSV  testing.  Fact Sheet for Patients: PinkCheek.be  Fact Sheet for Healthcare Providers: GravelBags.it  This test is not yet approved or cleared by the [place] FDA and  has been authorized for detection and/or diagnosis of SARS-CoV-2 by  FDA under an Emergency Use Authorization (EUA). This EUA will remain  in effect (meaning this test can be used) for the duration of the  Covid-19 declaration under Section 564(b)(1) of the Act, 21  U.S.C. section 360bbb-3(b)(1), unless the authorization is  terminated or revoked. Performed at [facility], [place] [address] [place], [place] [address]   Respiratory Panel by RT PCR (Flu A&B, Covid) - Nasopharyngeal Swab     Status: None   Collection Time: 09/19/20 11:30 AM   Specimen: Nasopharyngeal Swab  Result Value Ref Range Status   SARS Coronavirus 2 by RT PCR NEGATIVE NEGATIVE Final    Comment: (NOTE) SARS-CoV-2 target nucleic acids are NOT DETECTED.  The SARS-CoV-2 RNA is generally detectable in upper respiratoy specimens during the acute phase of infection. The lowest concentration of SARS-CoV-2 viral copies this assay can detect is 131 copies/mL. A negative result does not preclude SARS-Cov-2 infection and should not be used as the sole basis for treatment or other patient management decisions. A negative result may occur with  improper specimen collection/handling, submission of specimen other than nasopharyngeal swab, presence of viral mutation(s) within the areas targeted by this assay, and inadequate number of  viral copies (<131 copies/mL). A negative result must be combined with clinical observations, patient history, and epidemiological information. The expected result is Negative.  Fact Sheet for Patients:  PinkCheek.be  Fact Sheet for Healthcare Providers:  GravelBags.it  This test is no t yet approved or cleared by the [place] FDA and  has been authorized for detection and/or diagnosis of SARS-CoV-2 by FDA under an Emergency Use Authorization (EUA). This EUA will remain  in effect (meaning this test can be used) for the duration of the COVID-19 declaration under Section 564(b)(1) of the Act, 21 U.S.C. section 360bbb-3(b)(1), unless the authorization is terminated or revoked sooner.     Influenza A by PCR NEGATIVE NEGATIVE Final   Influenza B by PCR NEGATIVE NEGATIVE Final    Comment: (NOTE) The Xpert Xpress SARS-CoV-2/FLU/RSV assay is intended as an aid in  the diagnosis of influenza from Nasopharyngeal swab specimens and  should not be used as a sole basis for treatment. Nasal washings and  aspirates are unacceptable for Xpert Xpress SARS-CoV-2/FLU/RSV  testing.  Fact Sheet for Patients: PinkCheek.be  Fact Sheet for Healthcare Providers: GravelBags.it  This test is not yet approved or cleared by the [place] FDA and  has been authorized for detection and/or diagnosis of SARS-CoV-2 by  FDA under an Emergency Use Authorization (EUA). This EUA will remain  in effect (meaning this test can be used) for the duration of the  Covid-19 declaration under Section 564(b)(1) of the  Act, 21  U.S.C. section 360bbb-3(b)(1), unless the authorization is  terminated or revoked. Performed at [facility], [place] [address] [place], [place] [address]      Labs: Basic Metabolic Panel: Recent Labs  Lab 09/13/20 0159 09/15/20 0055  NA 140 139  K 4.0 4.2  CL  107 109  CO2 23 19*  GLUCOSE 100* 120*  BUN 21 12  CREATININE 0.65 0.65  CALCIUM 9.1 9.2   Liver Function Tests: Recent Labs  Lab 09/13/20 0159  AST 26  ALT 29  ALKPHOS 43  BILITOT 1.0  PROT 5.4*  ALBUMIN 2.9*   CBC: Recent Labs  Lab 09/13/20 0159 09/15/20 0055  WBC 8.5 6.8  HGB 11.9* 11.8*  HCT 37.1 36.5  MCV 105.7* 104.9*  PLT 128* 116*    BNP (last 3 results) Recent Labs    05/17/20 1230 08/16/20 1452  BNP 237.4* 482.0*        Signed:  Oswald Hillock MD.  [facility] 09/19/2020, 2:37 PM

## 2020-09-19 NOTE — Progress Notes (Signed)
Discharge summary packet/pertinent documents provided to [facility] staff. D/C to [facility] as ordered. Pt remains alert/oriented in no apparent distress. NO complaints.

## 2020-09-19 NOTE — TOC Transition Note (Signed)
Transition of Care [facility]) - CM/SW Discharge Note   Patient Details  Name: Michele Green MRN: [id number] Date of Birth: 15-Jan-1941  Transition of Care [facility] CM/SW Contact:  Emeterio Reeve, [place] Phone Number: 09/19/2020, 1:47 PM   Clinical Narrative:     Pt will discharge to [facility] via p[facility]. Pt was notified of transfer. Pts covid test is negative.  [facility] has been called.   Nurse to call report to [phone].  Final next level of care: Skilled Nursing Facility Barriers to Discharge: Barriers Resolved   Patient Goals and CMS Choice Patient states their goals for this hospitalization and ongoing recovery are:: Return home CMS Medicare.gov Compare Post Acute Care list provided to:: Patient Choice offered to / list presented to : Patient  Discharge Placement              Patient chooses bed at: [facility] Patient to be transferred to facility by: [facility] Name of family member notified: Pt was notified Patient and family notified of of transfer: 09/19/20  Discharge Plan and Services                                     Social Determinants of Health (SDOH) Interventions     Readmission Risk Interventions No flowsheet data found.  Emeterio Reeve, Latanya Presser, [place] Social Worker [phone]

## 2020-09-19 NOTE — Progress Notes (Signed)
Report given to Clare at [facility]. All questions and concerns were fully answred.

## 2020-09-22 ENCOUNTER — Ambulatory Visit: Payer: Medicare Other | Admitting: Thoracic Surgery (Cardiothoracic Vascular Surgery)

## 2020-09-23 ENCOUNTER — Other Ambulatory Visit: Payer: Self-pay | Admitting: Interventional Cardiology

## 2020-09-29 ENCOUNTER — Ambulatory Visit: Payer: Medicare Other | Admitting: Thoracic Surgery (Cardiothoracic Vascular Surgery)

## 2020-10-01 NOTE — Progress Notes (Signed)
[facility]   [phone] [phone]   Clinic Follow up Note   Patient Care Team: Shaw, Kimberlee, MD as PCP - General (Family Medicine) Smith, Henry W, MD as PCP - Cardiology (Cardiology) Camnitz, Will Martin, MD as PCP - Electrophysiology (Cardiology)  Date of Service:  10/02/2020  CHIEF COMPLAINT: Follow up left breast cancer  SUMMARY OF ONCOLOGIC HISTORY: Oncology History Overview Note  Breast cancer of upper-outer quadrant of left female breast (HCC)   Staging form: Breast, AJCC 7th Edition   - Clinical stage from 01/28/2016: Stage IA (T1a, N0, M0) - Signed by Lorrena Goranson, MD on 02/03/2016   - Pathologic stage from 02/23/2016: Stage IA (T1b, N0, cM0) - Signed by Treshon Stannard, MD on 06/14/2016      Breast cancer of upper-outer quadrant of left female breast (HCC)  01/14/2016 Mammogram   Screening mammogram showed possible mass with distortion in the left breast.   01/23/2016 Imaging   Diagnostic mammogram and ultrasound of the left breast showed a 3-4 mm shadowing mass at 1:00 in the left breast, no adenopathy.   01/28/2016 Initial Diagnosis   Breast cancer of upper-outer quadrant of left female breast (HCC)   01/28/2016 Initial Biopsy   Left breast mass biopsy showed invasive ductal carcinoma, grade 1.   01/28/2016 Receptors her2   Your 100% positive, PR 100% positive, HER-2 negative, Ki-67 5%   02/23/2016 Surgery   Left breast lumpectomy, and sentinel lymph node biopsy   02/23/2016 Pathology Results   Left breast lumpectomy showed a 1.0 cm invasive ductal carcinoma, grade 1, no lymphvascular invasion, invasive carcinoma is broadly less than 0.1 cm to the anterior inferior margin, ADH, ALH, 1 node(-). Left inferior reexcision margin showed LCIS    04/21/2016 - 05/19/2016 Radiation Therapy   adjuvant breast radiation    06/10/2016 -  Anti-estrogen oral therapy   Anastrozole 1 mg daily.  Changed to Tamoxifen due to osteoporosis on 09/30/17, which was held  in Sep 2019 for her open heart surgery and restarted in 10/2018     02/03/2017 Mammogram   Bilateral diagnostic mammogram 02/03/17 IMPRESSION: Expected surgical changes in the upper-outer quadrant of the left breast. No mammographic evidence of malignancy in the bilateral breasts.   02/20/2018 Mammogram   02/20/2018 Mammogram IMPRESSION: No evidence of malignancy within either breast. Stable postsurgical changes within the left breast.   03/09/2019 Mammogram   IMPRESSION: No mammographic evidence of malignancy.   RECOMMENDATION: Annual diagnostic mammography.      CURRENT THERAPY:  1. Anastrozole 1mg daily, started in 05/2016, changed to Tamoxifen due to osteoporosis on 09/30/17, which was held in Sep 2019 for her open heart surgeryand restarted in 10/2018. Plan to complete 05/2021.  2. Prolia injections every 6 months with Dr. shaw  INTERVAL HISTORY:  Michele Green is here for a follow up of left breast cancer. She was last seen by me 5 months ago. She presents to the clinic alone.  She came in a wheelchair. She had a colonoscopy 3 months ago by Dr. Karki, 1 biopsy showed high-grade dysplasia.  Dr. Karki discussed total colectomy, which patient declined.  She is quite distressed out by that.  She had compression fractrure in 07/2020 and underwent kyphoplasty, her pain is much better, she still wears braces. She is on Xarelto for atrial fibrillation, she has extensive skin bruises on her arms and hands. Otherwise she is clinically stable, no other concerns.   All other systems were reviewed with the   patient and are negative.  MEDICAL HISTORY:  Past Medical History:  Diagnosis Date  . Arrhythmia   . Atypical atrial flutter (HCC) 08/14/2018   Post-operative  . Breast cancer of upper-outer quadrant of left female breast (HCC) 02/02/2016  . CAD (coronary artery disease) 06/20/2018   LHC 7/19: pLAD 65/90, oD1 90, mLCx 85, OM2 50, oRCA 30, EF 50-55 >> s/p CABG in 9/19 (L-LAD)  .  Colon polyp 02/2012  . Dupuytren contracture    bilateral hands  . Family history of breast cancer   . Hyperlipidemia   . Hypertension   . Mitral regurgitation 11/07/2013   Echo 7/19: Mild LVH, EF 60-65, no RWMA, mod ot severe MR, massive LAE, PASP 33 // TEE 7/19:  Mild conc LVH, EF 60-65, no RWMA, severe MR with mild post leaflet prolapse, massive // s/p MV repair 07/2018  . On continuous oral anticoagulation 08/22/2015   Started on Xarelto 08/12/2015   . Osteopenia   . Persistent atrial fibrillation (HCC) 08/22/2015   Started late August or early September 2016 // s/p Maze procedure 07/2018  . Personal history of radiation therapy   . Radiation Therapy 04/21/16-05/19/16   left breast 47.72 Gy, boosted to 10 Gy  . 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 S/P Maze operation for atrial fibrillation 08/10/2018   Complete bilateral atrial lesion set using bipolar radiofrequency and cryothermy ablation with clipping of LA appendage  . S/P MVR (mitral valve repair) 08/10/2018   Complex valvuloplasty including Gore-tex neochord placement x8, Plication of Lateral Commissure and 28mm Sorin Memo 4D Ring Annuloplasty SN# [id number]  . Ulcerative colitis (HCC)   . Wears glasses     SURGICAL HISTORY: Past Surgical History:  Procedure Laterality Date  . BIOPSY  07/14/2020   Procedure: BIOPSY;  Surgeon: Karki, Arya, MD;  Location: [facility];  Service: Gastroenterology;;  . BREAST BIOPSY Left 01/28/2016  . BREAST LUMPECTOMY Left 02/23/2016  . BREAST LUMPECTOMY WITH RADIOACTIVE SEED LOCALIZATION Left 02/23/2016   Procedure: BREAST LUMPECTOMY WITH RADIOACTIVE SEED LOCALIZATION;  Surgeon: Benjamin Hoxworth, MD;  Location: [facility];  Service: General;  Laterality: Left;  . CARDIAC CATHETERIZATION    . CARDIOVERSION N/A 10/02/2015   Procedure: CARDIOVERSION;  Surgeon: Mark C Skains, MD;  Location: [facility];  Service: Cardiovascular;  Laterality: N/A;  . CARDIOVERSION N/A 10/05/2018    Procedure: CARDIOVERSION;  Surgeon: Ross, Paula V, MD;  Location: [facility];  Service: Cardiovascular;  Laterality: N/A;  . CARDIOVERSION N/A 11/05/2019   Procedure: CARDIOVERSION;  Surgeon: Nelson, Katarina H, MD;  Location: [facility];  Service: Cardiovascular;  Laterality: N/A;  . CLIPPING OF ATRIAL APPENDAGE  08/10/2018   Procedure: CLIPPING OF ATRIAL APPENDAGE;  Surgeon: Owen, Clarence H, MD;  Location: [facility];  Service: Open Heart Surgery;;  . COLONOSCOPY    . COLONOSCOPY WITH PROPOFOL N/A 07/14/2020   Procedure: COLONOSCOPY WITH PROPOFOL;  Surgeon: Karki, Arya, MD;  Location: [facility];  Service: Gastroenterology;  Laterality: N/A;  . CORONARY ARTERY BYPASS GRAFT N/A 08/10/2018   Procedure: CORONARY ARTERY BYPASS GRAFTING (CABG) x 1, LIMA-LAD,  USING LEFT INTERNAL MAMMARY ARTERY. HARVESTED RIGHT GREATER SAPHENOUS VEIN ENDOSCOPICALLY;  Surgeon: Owen, Clarence H, MD;  Location: [facility];  Service: Open Heart Surgery;  Laterality: N/A;  . CYSTOSCOPY W/ RETROGRADES Bilateral 06/20/2019   Procedure: CYSTOSCOPY WITH RETROGRADE PYELOGRAM;  Surgeon: Herrick, Benjamin W, MD;  Location: [facility];  Service: Urology;  Laterality: Bilateral;  . CYSTOSCOPY   WITH HYDRODISTENSION AND BIOPSY N/A 06/20/2019   Procedure: CYSTOSCOPY BLADDER  BIOPSY WITH FULGERATION;  Surgeon: Ardis Hughs, MD;  Location: [facility];  Service: Urology;  Laterality: N/A;  . DUPUYTREN CONTRACTURE RELEASE  2001   leftx2  . [place]   right  . DUPUYTREN CONTRACTURE RELEASE Right 05/02/2014   Procedure: EXCISION DUPUYTRENS RIGHT PALMAR/SMALL ;  Surgeon: Cammie Sickle, MD;  Location: [place];  Service: Orthopedics;  Laterality: Right;  . HEMOSTASIS CLIP PLACEMENT  07/14/2020   Procedure: HEMOSTASIS CLIP PLACEMENT;  Surgeon: Ronnette Juniper, MD;  Location: [facility];  Service: Gastroenterology;;  . IR VERTEBROPLASTY CERV/THOR BX INC UNI/BIL  INC/INJECT/IMAGING  08/19/2020  . IR VERTEBROPLASTY CERV/THOR BX INC UNI/BIL INC/INJECT/IMAGING  09/15/2020  . MAZE N/A 08/10/2018   Procedure: MAZE;  Surgeon: Rexene Alberts, MD;  Location: [place];  Service: Open Heart Surgery;  Laterality: N/A;  . MITRAL VALVE REPAIR N/A 08/10/2018   Procedure: MITRAL VALVE REPAIR (MVR);  Surgeon: Rexene Alberts, MD;  Location: [place];  Service: Open Heart Surgery;  Laterality: N/A;  glutaraldehyde  . POLYPECTOMY  07/14/2020   Procedure: POLYPECTOMY;  Surgeon: Ronnette Juniper, MD;  Location: [facility];  Service: Gastroenterology;;  . RIGHT/LEFT HEART CATH AND CORONARY ANGIOGRAPHY N/A 06/20/2018   Procedure: RIGHT/LEFT HEART CATH AND CORONARY ANGIOGRAPHY;  Surgeon: [name], MD;  Location: [place] [facility];  Service: Cardiovascular;  Laterality: N/A;  . TEE WITHOUT CARDIOVERSION N/A 06/20/2018   Procedure: TRANSESOPHAGEAL ECHOCARDIOGRAM (TEE);  Surgeon: Pixie Casino, MD;  Location: [facility];  Service: Cardiovascular;  Laterality: N/A;  . TEE WITHOUT CARDIOVERSION N/A 08/10/2018   Procedure: TRANSESOPHAGEAL ECHOCARDIOGRAM (TEE);  Surgeon: Rexene Alberts, MD;  Location: [place];  Service: Open Heart Surgery;  Laterality: N/A;  . TONSILLECTOMY  age 7    I have reviewed the social history and family history with the patient and they are unchanged from previous note.  ALLERGIES:  is allergic to shellfish allergy.  MEDICATIONS:  Current Outpatient Medications  Medication Sig Dispense Refill  . acetaminophen (TYLENOL) 500 MG tablet Take 500-1,000 mg by mouth every 6 (six) hours as needed for moderate pain or headache.     . allopurinol (ZYLOPRIM) 100 MG tablet Take 200 mg by mouth every evening.     [name] atorvastatin (LIPITOR) 20 MG tablet TAKE 1 TABLET BY MOUTH EVERY DAY (Patient taking differently: Take 20 mg by mouth daily. ) 90 tablet 3  . bisacodyl (DULCOLAX) 10 MG suppository Place 1 suppository (10 mg total) rectally daily as needed for moderate  constipation. 12 suppository 0  . cyclobenzaprine (FLEXERIL) 5 MG tablet Take 1 tablet (5 mg total) by mouth 3 (three) times daily as needed for muscle spasms. 30 tablet 0  . digoxin (LANOXIN) 0.125 MG tablet TAKE ONE TABLET BY MOUTH DAILY MONDAY THROUGH FRIDAY. DO NOT TAKE ON SATURDAY OR SUNDAY. 60 tablet 4  . docusate sodium (COLACE) 100 MG capsule Take 1 capsule (100 mg total) by mouth 2 (two) times daily. 10 capsule 0  . feeding supplement, ENSURE ENLIVE, (ENSURE ENLIVE) LIQD Take 237 mLs by mouth 2 (two) times daily between meals. 237 mL 12  . fentaNYL (DURAGESIC) 12 MCG/HR Place 1 patch onto the skin every 3 (three) days. 5 patch 0  . HUMIRA PEN 40 MG/0.4ML PNKT Inject 40 mg into the skin every 14 (fourteen) days.     . mesalamine (APRISO) 0.375 g 24  hr capsule Take 1.5 g by mouth daily.     . metoprolol tartrate (LOPRESSOR) 100 MG tablet TAKE 1 TABLET BY MOUTH TWICE A DAY 180 tablet 3  . Multiple Vitamin (MULTIVITAMIN WITH MINERALS) TABS tablet Take 1 tablet by mouth daily.    [name] oxyCODONE (OXY IR/ROXICODONE) 5 MG immediate release tablet Take 1 tablet (5 mg total) by mouth every 6 (six) hours as needed for severe pain. 5 tablet 0  . potassium chloride SA (KLOR-CON M20) 20 MEQ tablet Take 1 tablet (20 mEq total) by mouth daily as needed. Take only when taking a Lasix 90 tablet 3  . predniSONE (DELTASONE) 10 MG tablet Take 20 mg by mouth daily.    . tamoxifen (NOLVADEX) 20 MG tablet Take 20 mg by mouth daily. (Patient not taking: Reported on 09/14/2020)    . XARELTO 15 MG TABS tablet TAKE 1 TABLET BY MOUTH  DAILY WITH SUPPER (Patient taking differently: Take 15 mg by mouth daily. ) 90 tablet 1   No current facility-administered medications for this visit.    PHYSICAL EXAMINATION: ECOG PERFORMANCE STATUS: 3 - Symptomatic, >50% confined to bed  Vitals:   10/02/20 1043  BP: 114/73  Pulse: 66  Resp: 17  Temp: (!) 97.1 F (36.2 C)  SpO2: 99%   Filed Weights   10/02/20 1043  Weight:  128 lb 1.6 oz (58.1 kg)    GENERAL:alert, no distress and comfortable SKIN: skin color, texture, turgor are normal, no rashes or significant lesions except extensive ecchymosis on both arms and hands EYES: normal, Conjunctiva are pink and non-injected, sclera clear NECK: supple, thyroid normal size, non-tender, without nodularity Musculoskeletal:no cyanosis of digits and no clubbing  NEURO: alert & oriented x 3 with fluent speech, no focal motor/sensory deficits Patient declined breast exam.  LABORATORY DATA:  I have reviewed the data as listed CBC Latest Ref Rng & Units 09/15/2020 09/13/2020 09/12/2020  WBC 4.0 - 10.5 K/uL 6.8 8.5 11.6(H)  Hemoglobin 12.0 - 15.0 g/dL 11.8(L) 11.9(L) 12.3  Hematocrit 36 - 46 % 36.5 37.1 38.5  Platelets 150 - 400 K/uL 116(L) 128(L) 125(L)     CMP Latest Ref Rng & Units 09/15/2020 09/13/2020 09/12/2020  Glucose 70 - 99 mg/dL 120(H) 100(H) 148(H)  BUN 8 - 23 mg/dL 12 21 25(H)  Creatinine 0.44 - 1.00 mg/dL 0.65 0.65 0.81  Sodium 135 - 145 mmol/L 139 140 141  Potassium 3.5 - 5.1 mmol/L 4.2 4.0 3.9  Chloride 98 - 111 mmol/L 109 107 108  CO2 22 - 32 mmol/L 19(L) 23 24  Calcium 8.9 - 10.3 mg/dL 9.2 9.1 9.1  Total Protein 6.5 - 8.1 g/dL - 5.4(L) -  Total Bilirubin 0.3 - 1.2 mg/dL - 1.0 -  Alkaline Phos 38 - 126 U/L - 43 -  AST 15 - 41 U/L - 26 -  ALT 0 - 44 U/L - 29 -      RADIOGRAPHIC STUDIES: I have personally reviewed the radiological images as listed and agreed with the findings in the report. No results found.   ASSESSMENT & PLAN:  JEANANNE BEDWELL is a 79 y.o. female with   1. Breast cancer of upper-outer quadrant of left female breast, G1 invasive ductal carcinoma, pT1bN0M0, stage IA, ER+/PR+/HER2- -She was diagnosed in 01/2016. She is s/p left breast lumpectomy and adjuvant radiation.  -She started anti-estrogen therapywith anastrozole in 05/2016. Due to osteoporosis she was switched to Tamoxifen therapy. Tolerable with mild hot  flashes -She has completed a  total of 5 years antiestrogen therapy, plan to stop now. -She is clinically stable, lab reviewed, exam was unremarkable, no clinical concern for recurrence -Next mammogram in 02/2021 -We discussed breast cancer surveillance, she will continue mammogram annually. -Due to her advanced age and multiple medical comorbidities, she opted to see me as needed in the future   2.  Ulcerative colitis -f/u with GI Dr. Karki -Recent colonoscopy biopsy showed low-grade dysplasia.  Dr. Karki recommended total colectomy, patient declined. -I encouraged her to follow-up with Dr. Karki with colonoscopy every year for cancer screening.  3. HTN,CAD,AF, mitral regurgitation  -She'll continue follow-up with her primary care physician and cardiologist. -She iss/p CABGx1,mitral valve repair and maze operation for atrial fibrillationon9/19/2019.  R-She waspreviouslyon Coumadin, now on Xarelto.   4. Geneticstesting was negative  5. Macrocytic anemia and history of iron deficiency -Developed anemia after her open heart surgery, resolved now  -She has had microcytosis even before her open heart surgery -folic acid, B12, methylmalonic acid level were WNL in Sep 2020 -History of iron deficiency, probably related to ulcerative colitis -mild anemia is stable   Plan -She has completed 5 years of antiestrogen therapy, will stop tamoxifen. -She will continue annual mammogram, next in 02/2021 -See her as needed in the future -I spoke with her GI Dr. Karki today   No problem-specific Assessment & Plan notes found for this encounter.   Orders Placed This Encounter  Procedures  . MM Digital Screening    Standing Status:   Future    Standing Expiration Date:   10/02/2021    Order Specific Question:   Reason for Exam (SYMPTOM  OR DIAGNOSIS REQUIRED)    Answer:   screening    Order Specific Question:   Preferred imaging location?    [facility]   All  questions were answered. The patient knows to call the clinic with any problems, questions or concerns. No barriers to learning was detected. The total time spent in the appointment was 30 minutes.      , MD 10/02/2020   I, Amoya Bennett, am acting as scribe for  , MD.   I have reviewed the above documentation for accuracy and completeness, and I agree with the above.       

## 2020-10-02 ENCOUNTER — Other Ambulatory Visit: Payer: Self-pay

## 2020-10-02 ENCOUNTER — Inpatient Hospital Stay: Payer: Medicare Other | Attending: Hematology | Admitting: Hematology

## 2020-10-02 ENCOUNTER — Telehealth: Payer: Self-pay | Admitting: *Deleted

## 2020-10-02 ENCOUNTER — Encounter: Payer: Self-pay | Admitting: Hematology

## 2020-10-02 VITALS — BP 114/73 | HR 66 | Temp 97.1°F | Resp 17 | Ht 64.0 in | Wt 128.1 lb

## 2020-10-02 DIAGNOSIS — Z853 Personal history of malignant neoplasm of breast: Secondary | ICD-10-CM | POA: Diagnosis not present

## 2020-10-02 DIAGNOSIS — I10 Essential (primary) hypertension: Secondary | ICD-10-CM | POA: Insufficient documentation

## 2020-10-02 DIAGNOSIS — Z7901 Long term (current) use of anticoagulants: Secondary | ICD-10-CM | POA: Diagnosis not present

## 2020-10-02 DIAGNOSIS — Z1231 Encounter for screening mammogram for malignant neoplasm of breast: Secondary | ICD-10-CM

## 2020-10-02 DIAGNOSIS — D539 Nutritional anemia, unspecified: Secondary | ICD-10-CM | POA: Diagnosis not present

## 2020-10-02 DIAGNOSIS — Z170 Estrogen receptor positive status [ER+]: Secondary | ICD-10-CM

## 2020-10-02 DIAGNOSIS — I4891 Unspecified atrial fibrillation: Secondary | ICD-10-CM | POA: Insufficient documentation

## 2020-10-02 DIAGNOSIS — C50412 Malignant neoplasm of upper-outer quadrant of left female breast: Secondary | ICD-10-CM | POA: Diagnosis not present

## 2020-10-02 DIAGNOSIS — I340 Nonrheumatic mitral (valve) insufficiency: Secondary | ICD-10-CM | POA: Insufficient documentation

## 2020-10-02 DIAGNOSIS — K5190 Ulcerative colitis, unspecified, without complications: Secondary | ICD-10-CM | POA: Insufficient documentation

## 2020-10-02 DIAGNOSIS — I25810 Atherosclerosis of coronary artery bypass graft(s) without angina pectoris: Secondary | ICD-10-CM | POA: Insufficient documentation

## 2020-10-02 NOTE — Telephone Encounter (Signed)
Received call from pt's sister, Seward Meth with questions about visit today & a referral to a doctor that may be able to procedure instead of going to  [facility].  Discussed with Dr Burr Medico & referral not made yet but Dr Burr Medico will reach out to MD & get back with pt if procedure can be done.  [name] asked that Dr Burr Medico call her when she knows something.  Message to Dr Burr Medico.

## 2020-10-03 ENCOUNTER — Telehealth: Payer: Self-pay | Admitting: Hematology

## 2020-10-03 NOTE — Telephone Encounter (Signed)
F/u as needed per 11/11 los

## 2020-10-06 ENCOUNTER — Other Ambulatory Visit: Payer: Self-pay | Admitting: Interventional Cardiology

## 2020-10-06 ENCOUNTER — Ambulatory Visit: Payer: Medicare Other | Admitting: Thoracic Surgery (Cardiothoracic Vascular Surgery)

## 2020-10-06 ENCOUNTER — Encounter: Payer: Self-pay | Admitting: Thoracic Surgery (Cardiothoracic Vascular Surgery)

## 2020-10-06 ENCOUNTER — Other Ambulatory Visit: Payer: Self-pay

## 2020-10-06 VITALS — BP 110/72 | HR 110 | Temp 97.6°F | Resp 20 | Ht 64.0 in | Wt 126.0 lb

## 2020-10-06 DIAGNOSIS — Z951 Presence of aortocoronary bypass graft: Secondary | ICD-10-CM | POA: Diagnosis not present

## 2020-10-06 DIAGNOSIS — I4819 Other persistent atrial fibrillation: Secondary | ICD-10-CM

## 2020-10-06 DIAGNOSIS — Z9889 Other specified postprocedural states: Secondary | ICD-10-CM | POA: Diagnosis not present

## 2020-10-06 DIAGNOSIS — Z8679 Personal history of other diseases of the circulatory system: Secondary | ICD-10-CM

## 2020-10-06 NOTE — Telephone Encounter (Signed)
Prescription refill request for Xarelto received.   Iindication: Afibb Last office visit: Tamala Julian 08/29/2020 Weight: 58.1 kg  Age: 79 yo Scr: 0.65, 09/15/2020 CrCl: 62 ml/min

## 2020-10-06 NOTE — Progress Notes (Signed)
Michele Green [address]       [place] [id number]             [phone]     CARDIOTHORACIC SURGERY OFFICE NOTE  Primary Cardiologist is Michele Crome, MD Primary Electrophysiologist is Michele Alesia Morin, MD PCP is Michele Neer, MD   HPI:  Patient is a 79 year old female with history of mitral regurgitation, long-standing persistent atrial fibrillation on oral anticoagulation using Xarelto, hypertension, chronic diastolic congestive heart failure, previous spine surgery for management ofcompression fracture of the lumbar spine, and remote history of breast cancer whoreturns to the office today forroutine follow-up approximately 2 years status post mitral valve repair, coronary artery bypass grafting x1, and Maze procedure on August 10, 2018.  Patient was last seen here in our office on September 03, 2019 at which time her rhythm appeared to be atypical atrial flutter with elevated heart rate.  Transthoracic echocardiogram performed 2 days later revealed normal left ventricular systolic function with ejection fraction estimated 60 to 65%.  Mitral valve repair remained intact with very mild residual mitral regurgitation.  There was severe left atrial enlargement.  She was seen in follow-up by Michele Green and later underwent an attempted DC cardioversion, but she had early return of atrial flutter.  Since then she has been managed with long-term anticoagulation and medical therapy for rate control.  Patient returns her office today and reports that she is doing fairly well.  Over the past year she has undergone 2 surgical procedures for treatment of compression fractures of her spine.  Currently she states that she is doing much better and her back pain is under much better control.  She states that sometimes her heart rate is normal and sometimes it is fast.  She never has any symptoms of palpitations.  She has chronic symptoms of exertional shortness of breath that seem to be  relatively stable.  She is not very active physically but she is getting along acceptably well for her age.  She has not had any bleeding complications on long-term anticoagulation using Xarelto.   Current Outpatient Medications  Medication Sig Dispense Refill  . acetaminophen (TYLENOL) 500 MG tablet Take 500-1,000 mg by mouth every 6 (six) hours as needed for moderate pain or headache.     . allopurinol (ZYLOPRIM) 100 MG tablet Take 200 mg by mouth every evening.     [name] atorvastatin (LIPITOR) 20 MG tablet TAKE 1 TABLET BY MOUTH EVERY DAY (Patient taking differently: Take 20 mg by mouth daily. ) 90 tablet 3  . bisacodyl (DULCOLAX) 10 MG suppository Place 1 suppository (10 mg total) rectally daily as needed for moderate constipation. 12 suppository 0  . cyclobenzaprine (FLEXERIL) 5 MG tablet Take 1 tablet (5 mg total) by mouth 3 (three) times daily as needed for muscle spasms. 30 tablet 0  . digoxin (LANOXIN) 0.125 MG tablet TAKE ONE TABLET BY MOUTH DAILY MONDAY THROUGH FRIDAY. DO NOT TAKE ON SATURDAY OR SUNDAY. 60 tablet 4  . docusate sodium (COLACE) 100 MG capsule Take 1 capsule (100 mg total) by mouth 2 (two) times daily. 10 capsule 0  . feeding supplement, ENSURE ENLIVE, (ENSURE ENLIVE) LIQD Take 237 mLs by mouth 2 (two) times daily between meals. 237 mL 12  . fentaNYL (DURAGESIC) 12 MCG/HR Place 1 patch onto the skin every 3 (three) days. 5 patch 0  . HUMIRA PEN 40 MG/0.4ML PNKT Inject 40 mg into the skin every 14 (fourteen) days.     [name]  mesalamine (APRISO) 0.375 g 24 hr capsule Take 1.5 g by mouth daily.     . metoprolol tartrate (LOPRESSOR) 100 MG tablet TAKE 1 TABLET BY MOUTH TWICE A DAY 180 tablet 3  . Multiple Vitamin (MULTIVITAMIN WITH MINERALS) TABS tablet Take 1 tablet by mouth daily.    [name] oxyCODONE (OXY IR/ROXICODONE) 5 MG immediate release tablet Take 1 tablet (5 mg total) by mouth every 6 (six) hours as needed for severe pain. 5 tablet 0  . potassium chloride SA (KLOR-CON M20) 20 MEQ  tablet Take 1 tablet (20 mEq total) by mouth daily as needed. Take only when taking a Lasix 90 tablet 3  . predniSONE (DELTASONE) 10 MG tablet Take 20 mg by mouth daily.    Alveda Reasons 15 MG TABS tablet TAKE 1 TABLET BY MOUTH  DAILY WITH SUPPER (Patient taking differently: Take 15 mg by mouth daily. ) 90 tablet 1  . tamoxifen (NOLVADEX) 20 MG tablet Take 20 mg by mouth daily.  (Patient not taking: Reported on 10/06/2020)     No current facility-administered medications for this visit.      Physical Exam:   BP 110/72   Pulse (!) 110   Temp 97.6 F (36.4 C) (Skin)   Resp 20   Ht 5' 4"  (1.626 m)   Wt 126 lb (57.2 kg)   SpO2 96% Comment: RA  BMI 21.63 kg/m   General:  Elderly and frail  Chest:   Clear to auscultation  CV:   Tachycardic without murmur  Incisions:  n/a  Abdomen:  Soft nontender  Extremities:  Warm and well-perfused  Diagnostic Tests:  2 channel telemetry rhythm strip demonstrates what appears to be atrial flutter with heart rate 120-130   Impression:  Patient appears clinically stable approximately 2 years status post mitral valve repair and Maze procedure.  Postoperatively she developed atypical atrial flutter which has persisted and failed attempts at DC cardioversion and medical therapy with amiodarone.  In our office today her heart rate is somewhat elevated but the patient is clinically asymptomatic.  Most recent echocardiogram was approximately 1 year ago.  Plan:  We have not recommended any change the patient's current medications.  [name] continue to follow-up with Dr. Tamala Green and Michele Green for long-term management.  In the future [name] call and return to see [place] as needed.  All of her questions have been answered.    I spent in excess of 15 minutes during the conduct of this office consultation and >50% of this time involved direct face-to-face encounter with the patient for counseling and/or coordination of their care.    Michele Gu. Roxy Manns,  MD 10/06/2020 11:15 AM

## 2020-10-06 NOTE — Telephone Encounter (Signed)
Pt on Xarelto 82m daily, per dosing criteria she qualifies for xarelto 223m Spoke with [employer], because pt's Scr has been fluctuating will keep pt on Xarelto 1524maily. Pt has an appointment with Dr. SmiTamala Green 12/29/2020 put on appointment note for pt to have CBC and  Bmet to follow Scr trends.   Prescription refill for Xarelto 20m22mnt.

## 2020-10-06 NOTE — Patient Instructions (Addendum)

## 2020-10-28 ENCOUNTER — Other Ambulatory Visit: Payer: Self-pay | Admitting: Interventional Cardiology

## 2020-12-01 ENCOUNTER — Encounter (HOSPITAL_COMMUNITY): Payer: Self-pay

## 2020-12-01 ENCOUNTER — Inpatient Hospital Stay (HOSPITAL_COMMUNITY)
Admission: EM | Admit: 2020-12-01 | Discharge: 2020-12-11 | DRG: 177 | Disposition: A | Discharge status: Skilled Nursing Facility | Payer: Medicare Other | Source: Skilled Nursing Facility | Attending: Family Medicine | Admitting: Family Medicine

## 2020-12-01 ENCOUNTER — Emergency Department (HOSPITAL_COMMUNITY): Payer: Medicare Other

## 2020-12-01 DIAGNOSIS — I5032 Chronic diastolic (congestive) heart failure: Secondary | ICD-10-CM | POA: Diagnosis present

## 2020-12-01 DIAGNOSIS — I4819 Other persistent atrial fibrillation: Secondary | ICD-10-CM | POA: Diagnosis present

## 2020-12-01 DIAGNOSIS — E860 Dehydration: Secondary | ICD-10-CM | POA: Diagnosis present

## 2020-12-01 DIAGNOSIS — I10 Essential (primary) hypertension: Secondary | ICD-10-CM | POA: Diagnosis present

## 2020-12-01 DIAGNOSIS — R498 Other voice and resonance disorders: Secondary | ICD-10-CM | POA: Diagnosis not present

## 2020-12-01 DIAGNOSIS — I499 Cardiac arrhythmia, unspecified: Secondary | ICD-10-CM | POA: Diagnosis not present

## 2020-12-01 DIAGNOSIS — I110 Hypertensive heart disease with heart failure: Secondary | ICD-10-CM | POA: Diagnosis present

## 2020-12-01 DIAGNOSIS — J1282 Pneumonia due to coronavirus disease 2019: Secondary | ICD-10-CM

## 2020-12-01 DIAGNOSIS — Z803 Family history of malignant neoplasm of breast: Secondary | ICD-10-CM | POA: Diagnosis not present

## 2020-12-01 DIAGNOSIS — J9601 Acute respiratory failure with hypoxia: Secondary | ICD-10-CM | POA: Diagnosis not present

## 2020-12-01 DIAGNOSIS — Z923 Personal history of irradiation: Secondary | ICD-10-CM | POA: Diagnosis not present

## 2020-12-01 DIAGNOSIS — I4891 Unspecified atrial fibrillation: Secondary | ICD-10-CM | POA: Diagnosis not present

## 2020-12-01 DIAGNOSIS — I4821 Permanent atrial fibrillation: Secondary | ICD-10-CM | POA: Diagnosis present

## 2020-12-01 DIAGNOSIS — Z66 Do not resuscitate: Secondary | ICD-10-CM | POA: Diagnosis present

## 2020-12-01 DIAGNOSIS — I2510 Atherosclerotic heart disease of native coronary artery without angina pectoris: Secondary | ICD-10-CM | POA: Diagnosis not present

## 2020-12-01 DIAGNOSIS — U071 COVID-19: Principal | ICD-10-CM | POA: Diagnosis present

## 2020-12-01 DIAGNOSIS — Z743 Need for continuous supervision: Secondary | ICD-10-CM | POA: Diagnosis not present

## 2020-12-01 DIAGNOSIS — E785 Hyperlipidemia, unspecified: Secondary | ICD-10-CM | POA: Diagnosis present

## 2020-12-01 DIAGNOSIS — Z79899 Other long term (current) drug therapy: Secondary | ICD-10-CM | POA: Diagnosis not present

## 2020-12-01 DIAGNOSIS — M6281 Muscle weakness (generalized): Secondary | ICD-10-CM | POA: Diagnosis not present

## 2020-12-01 DIAGNOSIS — R0602 Shortness of breath: Secondary | ICD-10-CM | POA: Diagnosis not present

## 2020-12-01 DIAGNOSIS — Z515 Encounter for palliative care: Secondary | ICD-10-CM

## 2020-12-01 DIAGNOSIS — Z7901 Long term (current) use of anticoagulants: Secondary | ICD-10-CM

## 2020-12-01 DIAGNOSIS — C50412 Malignant neoplasm of upper-outer quadrant of left female breast: Secondary | ICD-10-CM | POA: Diagnosis present

## 2020-12-01 DIAGNOSIS — R5381 Other malaise: Secondary | ICD-10-CM | POA: Diagnosis not present

## 2020-12-01 DIAGNOSIS — Z952 Presence of prosthetic heart valve: Secondary | ICD-10-CM

## 2020-12-01 DIAGNOSIS — Z951 Presence of aortocoronary bypass graft: Secondary | ICD-10-CM

## 2020-12-01 DIAGNOSIS — L8990 Pressure ulcer of unspecified site, unspecified stage: Secondary | ICD-10-CM | POA: Insufficient documentation

## 2020-12-01 DIAGNOSIS — Z853 Personal history of malignant neoplasm of breast: Secondary | ICD-10-CM | POA: Diagnosis not present

## 2020-12-01 DIAGNOSIS — M8580 Other specified disorders of bone density and structure, unspecified site: Secondary | ICD-10-CM | POA: Diagnosis not present

## 2020-12-01 DIAGNOSIS — R0689 Other abnormalities of breathing: Secondary | ICD-10-CM | POA: Diagnosis not present

## 2020-12-01 DIAGNOSIS — L89151 Pressure ulcer of sacral region, stage 1: Secondary | ICD-10-CM | POA: Diagnosis present

## 2020-12-01 DIAGNOSIS — R279 Unspecified lack of coordination: Secondary | ICD-10-CM | POA: Diagnosis not present

## 2020-12-01 DIAGNOSIS — R2689 Other abnormalities of gait and mobility: Secondary | ICD-10-CM | POA: Diagnosis not present

## 2020-12-01 DIAGNOSIS — Z87891 Personal history of nicotine dependence: Secondary | ICD-10-CM | POA: Diagnosis not present

## 2020-12-01 DIAGNOSIS — M7989 Other specified soft tissue disorders: Secondary | ICD-10-CM

## 2020-12-01 DIAGNOSIS — J9611 Chronic respiratory failure with hypoxia: Secondary | ICD-10-CM

## 2020-12-01 DIAGNOSIS — R2681 Unsteadiness on feet: Secondary | ICD-10-CM | POA: Diagnosis not present

## 2020-12-01 LAB — CBC WITH DIFFERENTIAL/PLATELET
Abs Immature Granulocytes: 0.11 10*3/uL — ABNORMAL HIGH (ref 0.00–0.07)
Basophils Absolute: 0 10*3/uL (ref 0.0–0.1)
Basophils Relative: 0 %
Eosinophils Absolute: 0 10*3/uL (ref 0.0–0.5)
Eosinophils Relative: 0 %
HCT: 28.3 % — ABNORMAL LOW (ref 36.0–46.0)
Hemoglobin: 9 g/dL — ABNORMAL LOW (ref 12.0–15.0)
Immature Granulocytes: 2 %
Lymphocytes Relative: 9 %
Lymphs Abs: 0.6 10*3/uL — ABNORMAL LOW (ref 0.7–4.0)
MCH: 33.8 pg (ref 26.0–34.0)
MCHC: 31.8 g/dL (ref 30.0–36.0)
MCV: 106.4 fL — ABNORMAL HIGH (ref 80.0–100.0)
Monocytes Absolute: 0.3 10*3/uL (ref 0.1–1.0)
Monocytes Relative: 5 %
Neutro Abs: 5 10*3/uL (ref 1.7–7.7)
Neutrophils Relative %: 84 %
Platelets: 161 10*3/uL (ref 150–400)
RBC: 2.66 MIL/uL — ABNORMAL LOW (ref 3.87–5.11)
RDW: 14.6 % (ref 11.5–15.5)
WBC: 6 10*3/uL (ref 4.0–10.5)
nRBC: 0.8 % — ABNORMAL HIGH (ref 0.0–0.2)

## 2020-12-01 LAB — I-STAT CHEM 8, ED
BUN: 17 mg/dL (ref 8–23)
Calcium, Ion: 1.11 mmol/L — ABNORMAL LOW (ref 1.15–1.40)
Chloride: 109 mmol/L (ref 98–111)
Creatinine, Ser: 0.9 mg/dL (ref 0.44–1.00)
Glucose, Bld: 86 mg/dL (ref 70–99)
HCT: 32 % — ABNORMAL LOW (ref 36.0–46.0)
Hemoglobin: 10.9 g/dL — ABNORMAL LOW (ref 12.0–15.0)
Potassium: 2.8 mmol/L — ABNORMAL LOW (ref 3.5–5.1)
Sodium: 147 mmol/L — ABNORMAL HIGH (ref 135–145)
TCO2: 24 mmol/L (ref 22–32)

## 2020-12-01 LAB — COMPREHENSIVE METABOLIC PANEL
ALT: 22 U/L (ref 0–44)
AST: 27 U/L (ref 15–41)
Albumin: 2.4 g/dL — ABNORMAL LOW (ref 3.5–5.0)
Alkaline Phosphatase: 45 U/L (ref 38–126)
Anion gap: 14 (ref 5–15)
BUN: 19 mg/dL (ref 8–23)
CO2: 23 mmol/L (ref 22–32)
Calcium: 8.1 mg/dL — ABNORMAL LOW (ref 8.9–10.3)
Chloride: 108 mmol/L (ref 98–111)
Creatinine, Ser: 1.06 mg/dL — ABNORMAL HIGH (ref 0.44–1.00)
GFR, Estimated: 53 mL/min — ABNORMAL LOW (ref >60)
Glucose, Bld: 91 mg/dL (ref 70–99)
Potassium: 2.7 mmol/L — CL (ref 3.5–5.1)
Sodium: 145 mmol/L (ref 135–145)
Total Bilirubin: 1.9 mg/dL — ABNORMAL HIGH (ref 0.3–1.2)
Total Protein: 6 g/dL — ABNORMAL LOW (ref 6.5–8.1)

## 2020-12-01 LAB — PROCALCITONIN: Procalcitonin: 0.23 ng/mL

## 2020-12-01 LAB — C-REACTIVE PROTEIN: CRP: 11 mg/dL — ABNORMAL HIGH (ref <1.0)

## 2020-12-01 LAB — FERRITIN: Ferritin: 265 ng/mL (ref 11–307)

## 2020-12-01 LAB — TRIGLYCERIDES: Triglycerides: 131 mg/dL (ref <150)

## 2020-12-01 LAB — POC SARS CORONAVIRUS 2 AG -  ED: SARS Coronavirus 2 Ag: POSITIVE — AB

## 2020-12-01 LAB — D-DIMER, QUANTITATIVE: D-Dimer, Quant: 6.31 ug/mL-FEU — ABNORMAL HIGH (ref 0.00–0.50)

## 2020-12-01 LAB — FIBRINOGEN: Fibrinogen: 532 mg/dL — ABNORMAL HIGH (ref 210–475)

## 2020-12-01 LAB — LACTIC ACID, PLASMA: Lactic Acid, Venous: 1.3 mmol/L (ref 0.5–1.9)

## 2020-12-01 LAB — LACTATE DEHYDROGENASE: LDH: 386 U/L — ABNORMAL HIGH (ref 98–192)

## 2020-12-01 MED ORDER — FENTANYL 12 MCG/HR TD PT72
1.0000 | MEDICATED_PATCH | TRANSDERMAL | Status: DC
  Filled 2020-12-01: qty 1

## 2020-12-01 MED ORDER — DILTIAZEM HCL-DEXTROSE 125-5 MG/125ML-% IV SOLN (PREMIX)
5.0000 mg/h | INTRAVENOUS | Status: DC
  Administered 2020-12-01 – 2020-12-02 (×2): 15 mg/h via INTRAVENOUS
  Administered 2020-12-02: 10 mg/h via INTRAVENOUS
  Administered 2020-12-02: 15 mg/h via INTRAVENOUS
  Filled 2020-12-01 (×4): qty 125

## 2020-12-01 MED ORDER — DIGOXIN 125 MCG PO TABS
0.1250 mg | ORAL_TABLET | Freq: Every day | ORAL | Status: DC
  Administered 2020-12-01 – 2020-12-05 (×4): 0.125 mg via ORAL
  Filled 2020-12-01 (×5): qty 1

## 2020-12-01 MED ORDER — ACETAMINOPHEN 325 MG PO TABS
650.0000 mg | ORAL_TABLET | Freq: Four times a day (QID) | ORAL | Status: DC | PRN
  Administered 2020-12-09: 650 mg via ORAL
  Filled 2020-12-01: qty 2

## 2020-12-01 MED ORDER — OXYCODONE HCL 5 MG PO TABS
5.0000 mg | ORAL_TABLET | Freq: Four times a day (QID) | ORAL | Status: DC | PRN
  Administered 2020-12-02 – 2020-12-05 (×5): 5 mg via ORAL
  Filled 2020-12-01 (×5): qty 1

## 2020-12-01 MED ORDER — POTASSIUM CHLORIDE CRYS ER 20 MEQ PO TBCR
40.0000 meq | EXTENDED_RELEASE_TABLET | Freq: Once | ORAL | Status: AC
  Administered 2020-12-01: 40 meq via ORAL
  Filled 2020-12-01: qty 2

## 2020-12-01 MED ORDER — DILTIAZEM HCL 25 MG/5ML IV SOLN
10.0000 mg | Freq: Once | INTRAVENOUS | Status: AC
  Administered 2020-12-01: 10 mg via INTRAVENOUS
  Filled 2020-12-01: qty 5

## 2020-12-01 MED ORDER — ZINC SULFATE 220 (50 ZN) MG PO CAPS
220.0000 mg | ORAL_CAPSULE | Freq: Every day | ORAL | Status: DC
  Administered 2020-12-01 – 2020-12-04 (×4): 220 mg via ORAL
  Filled 2020-12-01 (×4): qty 1

## 2020-12-01 MED ORDER — GUAIFENESIN-DM 100-10 MG/5ML PO SYRP: 10.0000 mL | ORAL_SOLUTION | ORAL | Status: DC | PRN

## 2020-12-01 MED ORDER — POLYETHYLENE GLYCOL 3350 17 G PO PACK: 17.0000 g | PACK | Freq: Every day | ORAL | Status: DC | PRN

## 2020-12-01 MED ORDER — SODIUM CHLORIDE 0.9 % IV SOLN
100.0000 mg | Freq: Every day | INTRAVENOUS | Status: DC
  Administered 2020-12-02 – 2020-12-03 (×2): 100 mg via INTRAVENOUS
  Filled 2020-12-01 (×2): qty 20

## 2020-12-01 MED ORDER — DEXAMETHASONE SODIUM PHOSPHATE 10 MG/ML IJ SOLN
10.0000 mg | Freq: Once | INTRAMUSCULAR | Status: AC
  Administered 2020-12-01: 10 mg via INTRAVENOUS
  Filled 2020-12-01: qty 1

## 2020-12-01 MED ORDER — MESALAMINE ER 250 MG PO CPCR
1500.0000 mg | ORAL_CAPSULE | Freq: Every day | ORAL | Status: DC
  Administered 2020-12-01 – 2020-12-04 (×4): 1500 mg via ORAL
  Filled 2020-12-01 (×4): qty 6

## 2020-12-01 MED ORDER — ACETAMINOPHEN 325 MG PO TABS
650.0000 mg | ORAL_TABLET | Freq: Once | ORAL | Status: AC
  Administered 2020-12-01: 650 mg via ORAL
  Filled 2020-12-01: qty 2

## 2020-12-01 MED ORDER — ONDANSETRON HCL 4 MG/2ML IJ SOLN
4.0000 mg | Freq: Four times a day (QID) | INTRAMUSCULAR | Status: DC | PRN
  Filled 2020-12-01: qty 2

## 2020-12-01 MED ORDER — METHYLPREDNISOLONE SODIUM SUCC 40 MG IJ SOLR
40.0000 mg | Freq: Two times a day (BID) | INTRAMUSCULAR | Status: DC
  Administered 2020-12-01 – 2020-12-03 (×4): 40 mg via INTRAVENOUS
  Filled 2020-12-01 (×5): qty 1

## 2020-12-01 MED ORDER — SODIUM CHLORIDE 0.45 % IV SOLN: INTRAVENOUS | Status: DC

## 2020-12-01 MED ORDER — ATORVASTATIN CALCIUM 10 MG PO TABS
20.0000 mg | ORAL_TABLET | Freq: Every day | ORAL | Status: DC
  Administered 2020-12-01 – 2020-12-04 (×4): 20 mg via ORAL
  Filled 2020-12-01 (×4): qty 2

## 2020-12-01 MED ORDER — RIVAROXABAN 15 MG PO TABS
15.0000 mg | ORAL_TABLET | Freq: Every day | ORAL | Status: DC
  Administered 2020-12-02 – 2020-12-04 (×4): 15 mg via ORAL
  Filled 2020-12-01 (×4): qty 1

## 2020-12-01 MED ORDER — ASCORBIC ACID 500 MG PO TABS
500.0000 mg | ORAL_TABLET | Freq: Every day | ORAL | Status: DC
  Administered 2020-12-01 – 2020-12-04 (×4): 500 mg via ORAL
  Filled 2020-12-01 (×4): qty 1

## 2020-12-01 MED ORDER — HYDROCOD POLST-CPM POLST ER 10-8 MG/5ML PO SUER: 5.0000 mL | Freq: Two times a day (BID) | ORAL | Status: DC | PRN

## 2020-12-01 MED ORDER — METOPROLOL TARTRATE 25 MG PO TABS
100.0000 mg | ORAL_TABLET | Freq: Two times a day (BID) | ORAL | Status: DC
  Administered 2020-12-01 – 2020-12-02 (×2): 100 mg via ORAL
  Filled 2020-12-01 (×3): qty 4

## 2020-12-01 MED ORDER — ALBUTEROL SULFATE HFA 108 (90 BASE) MCG/ACT IN AERS
2.0000 | INHALATION_SPRAY | Freq: Four times a day (QID) | RESPIRATORY_TRACT | Status: DC
  Administered 2020-12-01 – 2020-12-02 (×5): 2 via RESPIRATORY_TRACT
  Filled 2020-12-01 (×3): qty 6.7

## 2020-12-01 MED ORDER — POTASSIUM CHLORIDE 10 MEQ/100ML IV SOLN
10.0000 meq | INTRAVENOUS | Status: AC
  Administered 2020-12-01 (×3): 10 meq via INTRAVENOUS
  Filled 2020-12-01 (×3): qty 100

## 2020-12-01 MED ORDER — SODIUM CHLORIDE 0.9 % IV SOLN
200.0000 mg | Freq: Once | INTRAVENOUS | Status: AC
  Administered 2020-12-01: 200 mg via INTRAVENOUS
  Filled 2020-12-01: qty 200

## 2020-12-01 MED ORDER — ONDANSETRON HCL 4 MG PO TABS: 4.0000 mg | ORAL_TABLET | Freq: Four times a day (QID) | ORAL | Status: DC | PRN

## 2020-12-01 NOTE — ED Provider Notes (Addendum)
[facility] DEPT Provider Note   CSN: [id number] Arrival date & time: 12/01/20  1404     History Chief Complaint  Patient presents with  . Shortness of Breath    covid    Michele Green is a 80 y.o. female history of a flutter on Xarelto, here presenting with shortness of breath and positive COVID.  Patient is from [name] assisted [facility].  Patient states that she has been very tired for the last several days.  She also has some palpitations.  She states that she has worsening shortness of breath as well.  She was noted to be tachycardic around 140s and hypoxic to 86% on room air.  Patient was put on 3 L nasal cannula and oxygen went up to 97%.  Patient is not on oxygen at baseline.  Patient was vaccinated and had booster shot as well.   The history is provided by the patient.       Past Medical History:  Diagnosis Date  . Arrhythmia   . Atypical atrial flutter ([place]) 08/14/2018   Post-operative  . Breast cancer of upper-outer quadrant of left female breast ([place]) 02/02/2016  . CAD (coronary artery disease) 06/20/2018   LHC 7/19: pLAD 65/90, oD1 90, mLCx 85, OM2 50, oRCA 30, EF 50-55 >> s/p CABG in 9/19 (L-LAD)  . Colon polyp 02/2012  . Dupuytren contracture    bilateral hands  . Family history of breast cancer   . Hyperlipidemia   . Hypertension   . Mitral regurgitation 11/07/2013   Echo 7/19: Mild LVH, EF 60-65, no RWMA, mod ot severe MR, massive LAE, PASP 33 // TEE 7/19:  Mild conc LVH, EF 60-65, no RWMA, severe MR with mild post leaflet prolapse, massive // s/p MV repair 07/2018  . On continuous oral anticoagulation 08/22/2015   Started on Xarelto 08/12/2015   . Osteopenia   . Persistent atrial fibrillation ([place]) 08/22/2015   Started late August or early September 2016 // s/p Maze procedure 07/2018  . Personal history of radiation therapy   . Radiation Therapy 04/21/16-05/19/16   left breast 47.72 Gy, boosted to 10 Gy  . 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 S/P Maze operation for atrial fibrillation 08/10/2018   Complete bilateral atrial lesion set using bipolar radiofrequency and cryothermy ablation with clipping of LA appendage  . 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 Ulcerative colitis ([place]   . Wears glasses     Patient Active Problem List   Diagnosis Date Noted  . T8 vertebral fracture ([place] 09/13/2020  . Compression fracture of C-spine ([place] 09/12/2020  . Compression fracture of thoracic spine, non-traumatic ([place] 09/12/2020  . Goals of care, counseling/discussion   . Palliative care by specialist   . Spinal fracture of T7 vertebra ([place] 08/17/2020  . Atrial fibrillation with RVR ([place] 08/16/2020  . Long term (current) use of anticoagulants 08/28/2018  . Atrial flutter with rapid ventricular response ([place] 08/14/2018  . 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 S/P Maze operation for atrial fibrillation 08/10/2018  . Chronic diastolic HF (heart failure) ([place] 06/20/2018  . Osteoporosis 09/29/2017  . Loosening of hardware in spine ([place] 08/23/2017  . Spinal stenosis of lumbar region 08/11/2016  . Unspecified cord compression ([place] 08/11/2016  . Unstable  burst fracture of third lumbar vertebra ([place]) 08/11/2016  . Genetic testing 03/17/2016  . Family history of breast cancer   . Breast cancer of upper-outer quadrant of left female breast ([name]) 02/02/2016  . On amiodarone therapy 09/09/2015  . On continuous oral anticoagulation 08/22/2015  . Persistent atrial fibrillation ([place]) 08/22/2015  . Essential hypertension 11/07/2013  . Mitral regurgitation 11/07/2013    Past Surgical History:  Procedure Laterality Date  . BIOPSY  07/14/2020   Procedure: BIOPSY;  Surgeon: Ronnette Juniper, MD;  Location: [facility];  Service:  Gastroenterology;;  . BREAST BIOPSY Left 01/28/2016  . BREAST LUMPECTOMY Left 02/23/2016  . BREAST LUMPECTOMY WITH RADIOACTIVE SEED LOCALIZATION Left 02/23/2016   Procedure: BREAST LUMPECTOMY WITH RADIOACTIVE SEED LOCALIZATION;  Surgeon: [name], MD;  Location: [place];  Service: General;  Laterality: Left;  . CARDIAC CATHETERIZATION    . CARDIOVERSION N/A 10/02/2015   Procedure: CARDIOVERSION;  Surgeon: Jerline Pain, MD;  Location: [facility] ENDOSCOPY;  Service: Cardiovascular;  Laterality: N/A;  . CARDIOVERSION N/A 10/05/2018   Procedure: CARDIOVERSION;  Surgeon: Fay Records, MD;  Location: [facility] ENDOSCOPY;  Service: Cardiovascular;  Laterality: N/A;  . CARDIOVERSION N/A 11/05/2019   Procedure: CARDIOVERSION;  Surgeon: Dorothy Spark, MD;  Location: [place];  Service: Cardiovascular;  Laterality: N/A;  . CLIPPING OF ATRIAL APPENDAGE  08/10/2018   Procedure: CLIPPING OF ATRIAL APPENDAGE;  Surgeon: Rexene Alberts, MD;  Location: [place];  Service: Open Heart Surgery;;  . COLONOSCOPY    . COLONOSCOPY WITH PROPOFOL N/A 07/14/2020   Procedure: COLONOSCOPY WITH PROPOFOL;  Surgeon: Ronnette Juniper, MD;  Location: [facility];  Service: Gastroenterology;  Laterality: N/A;  . CORONARY ARTERY BYPASS GRAFT N/A 08/10/2018   Procedure: CORONARY ARTERY BYPASS GRAFTING (CABG) x 1, LIMA-LAD,  USING LEFT INTERNAL MAMMARY ARTERY. HARVESTED RIGHT GREATER SAPHENOUS VEIN ENDOSCOPICALLY;  Surgeon: Rexene Alberts, MD;  Location: [place];  Service: Open Heart Surgery;  Laterality: N/A;  . CYSTOSCOPY W/ RETROGRADES Bilateral 06/20/2019   Procedure: CYSTOSCOPY WITH RETROGRADE PYELOGRAM;  Surgeon: Ardis Hughs, MD;  Location: [facility];  Service: Urology;  Laterality: Bilateral;  . CYSTOSCOPY WITH HYDRODISTENSION AND BIOPSY N/A 06/20/2019   Procedure: CYSTOSCOPY BLADDER  BIOPSY WITH FULGERATION;  Surgeon: Ardis Hughs, MD;  Location[facility];   Service: Urology;  Laterality: N/A;  . DUPUYTREN CONTRACTURE RELEASE  2001   leftx2  . [place]   right  . DUPUYTREN CONTRACTURE RELEASE Right 05/02/2014   Procedure: EXCISION DUPUYTRENS RIGHT PALMAR/SMALL ;  Surgeon: Cammie Sickle, MD;  Location: [place];  Service: Orthopedics;  Laterality: Right;  . HEMOSTASIS CLIP PLACEMENT  07/14/2020   Procedure: HEMOSTASIS CLIP PLACEMENT;  Surgeon: Ronnette Juniper, MD;  [facility];  Service: Gastroenterology;;  . IR VERTEBROPLASTY CERV/THOR BX INC UNI/BIL INC/INJECT/IMAGING  08/19/2020  . IR VERTEBROPLASTY CERV/THOR BX INC UNI/BIL INC/INJECT/IMAGING  09/15/2020  . MAZE N/A 08/10/2018   Procedure: MAZE;  Surgeon: Rexene Alberts, MD;  Location: [place];  Service: Open Heart Surgery;  Laterality: N/A;  . MITRAL VALVE REPAIR N/A 08/10/2018   Procedure: MITRAL VALVE REPAIR (MVR);  Surgeon: Rexene Alberts, MD;  Location: [place];  Service: Open Heart Surgery;  Laterality: N/A;  glutaraldehyde  . POLYPECTOMY  07/14/2020   Procedure: POLYPECTOMY;  Surgeon: Ronnette Juniper, MD;  Location[facility];  Service: Gastroenterology;;  . RIGHT/LEFT HEART CATH AND CORONARY ANGIOGRAPHY N/A 06/20/2018  Procedure: RIGHT/LEFT HEART CATH AND CORONARY ANGIOGRAPHY;  Surgeon: [name], MD;  Location: [place] CV LAB;  Service: Cardiovascular;  Laterality: N/A;  . TEE WITHOUT CARDIOVERSION N/A 06/20/2018   Procedure: TRANSESOPHAGEAL ECHOCARDIOGRAM (TEE);  Surgeon: Pixie Casino, MD;  Location: [facility] ENDOSCOPY;  Service: Cardiovascular;  Laterality: N/A;  . TEE WITHOUT CARDIOVERSION N/A 08/10/2018   Procedure: TRANSESOPHAGEAL ECHOCARDIOGRAM (TEE);  Surgeon: Rexene Alberts, MD;  Location: [place];  Service: Open Heart Surgery;  Laterality: N/A;  . TONSILLECTOMY  age 64     OB History    Gravida  1   Para  1   Term  1   Preterm      AB      Living  1     SAB      IAB      Ectopic      Multiple       Live Births              Family History  Problem Relation Age of Onset  . Cirrhosis Mother   . Breast cancer Mother 103  . Heart failure Father   . Heart attack Father   . Breast cancer Sister        early 72's  . Kidney Stones Sister        loss of kidney due to stones  . Osteoporosis Neg Hx     Social History   Tobacco Use  . Smoking status: Former Smoker    Packs/day: 1.00    Years: 20.00    Pack years: 20.00    Types: Cigarettes    Quit date: 11/22/1992    Years since quitting: 28.0  . Smokeless tobacco: Never Used  Vaping Use  . Vaping Use: Never used  Substance Use Topics  . Alcohol use: Yes    Alcohol/week: 3.0 standard drinks    Types: 3 Standard drinks or equivalent per week    Comment: social  . Drug use: No    Home Medications Prior to Admission medications   Medication Sig Start Date End Date Taking? Authorizing Provider  acetaminophen (TYLENOL) 500 MG tablet Take 500-1,000 mg by mouth every 6 (six) hours as needed for moderate pain or headache.     [provider]  allopurinol (ZYLOPRIM) 100 MG tablet Take 200 mg by mouth every evening.  06/21/13   [provider]  atorvastatin (LIPITOR) 20 MG tablet TAKE 1 TABLET BY MOUTH EVERY DAY Patient taking differently: Take 20 mg by mouth daily.  04/07/20   Camnitz, Ocie Doyne, MD  bisacodyl (DULCOLAX) 10 MG suppository Place 1 suppository (10 mg total) rectally daily as needed for moderate constipation. 09/19/20   Oswald Hillock, MD  cyclobenzaprine (FLEXERIL) 5 MG tablet Take 1 tablet (5 mg total) by mouth 3 (three) times daily as needed for muscle spasms. 09/19/20   Oswald Hillock, MD  digoxin (LANOXIN) 0.125 MG tablet TAKE ONE TABLET BY MOUTH DAILY MONDAY THROUGH FRIDAY. DO NOT TAKE ON SATURDAY OR SUNDAY. 10/28/20   [name], MD  docusate sodium (COLACE) 100 MG capsule Take 1 capsule (100 mg total) by mouth 2 (two) times daily. 09/19/20   Oswald Hillock, MD  feeding supplement, ENSURE  ENLIVE, (ENSURE ENLIVE) LIQD Take 237 mLs by mouth 2 (two) times daily between meals. 08/23/20   Debbe Odea, MD  fentaNYL (DURAGESIC) 12 MCG/HR Place 1 patch onto the skin every 3 (three) days. 09/20/20   Eleonore Chiquito  S, MD  HUMIRA PEN 40 MG/0.4ML PNKT Inject 40 mg into the skin every 14 (fourteen) days.  09/09/20   [provider]  mesalamine (APRISO) 0.375 g 24 hr capsule Take 1.5 g by mouth daily.  06/01/19   [provider]  metoprolol tartrate (LOPRESSOR) 100 MG tablet TAKE 1 TABLET BY MOUTH TWICE A DAY 03/10/20   [name], MD  Multiple Vitamin (MULTIVITAMIN WITH MINERALS) TABS tablet Take 1 tablet by mouth daily. 08/24/20   Debbe Odea, MD  oxyCODONE (OXY IR/ROXICODONE) 5 MG immediate release tablet Take 1 tablet (5 mg total) by mouth every 6 (six) hours as needed for severe pain. 09/19/20   Oswald Hillock, MD  potassium chloride SA (KLOR-CON M20) 20 MEQ tablet Take 1 tablet (20 mEq total) by mouth daily as needed. Take only when taking a Lasix 08/23/20   Debbe Odea, MD  predniSONE (DELTASONE) 10 MG tablet Take 20 mg by mouth daily. 08/11/20   [provider]  tamoxifen (NOLVADEX) 20 MG tablet Take 20 mg by mouth daily.  Patient not taking: Reported on 10/06/2020    [provider]  XARELTO 15 MG TABS tablet TAKE 1 TABLET BY MOUTH  DAILY WITH SUPPER 10/06/20   [name], MD    Allergies    Shellfish allergy  Review of Systems   Review of Systems  Respiratory: Positive for shortness of breath.   All other systems reviewed and are negative.   Physical Exam Updated Vital Signs BP (!) 118/101   Pulse 97   Temp 100.2 F (37.9 C) (Rectal)   Resp (!) 37   Ht 5' 4"  (1.626 m)   Wt 58.1 kg   SpO2 94%   BMI 21.97 kg/m   Physical Exam Vitals and nursing note reviewed.  HENT:     Head: Normocephalic.  Eyes:     Pupils: Pupils are equal, round, and reactive to light.  Cardiovascular:     Rate and Rhythm: Tachycardia present. Rhythm  irregular.  Pulmonary:     Comments: Tachypneic, bibasilar crackles  Musculoskeletal:        General: Normal range of motion.     Cervical back: Normal range of motion and neck supple.  Skin:    General: Skin is warm.     Capillary Refill: Capillary refill takes less than 2 seconds.  Neurological:     General: No focal deficit present.     Mental Status: She is alert and oriented to person, place, and time.  Psychiatric:        Mood and Affect: Mood normal.        Behavior: Behavior normal.     ED Results / Procedures / Treatments   Labs (all labs ordered are listed, but only abnormal results are displayed) Labs Reviewed  CBC WITH DIFFERENTIAL/PLATELET - Abnormal; Notable for the following components:      Result Value   RBC 2.66 (*)    Hemoglobin 9.0 (*)    HCT 28.3 (*)    MCV 106.4 (*)    nRBC 0.8 (*)    Lymphs Abs 0.6 (*)    Abs Immature Granulocytes 0.11 (*)    All other components within normal limits  I-STAT CHEM 8, ED - Abnormal; Notable for the following components:   Sodium 147 (*)    Potassium 2.8 (*)    Calcium, Ion 1.11 (*)    Hemoglobin 10.9 (*)    HCT 32.0 (*)    All other  components within normal limits  CULTURE, BLOOD (ROUTINE X 2)  CULTURE, BLOOD (ROUTINE X 2)  LACTIC ACID, PLASMA  LACTIC ACID, PLASMA  COMPREHENSIVE METABOLIC PANEL  PROCALCITONIN  LACTATE DEHYDROGENASE  FERRITIN  TRIGLYCERIDES  C-REACTIVE PROTEIN  D-DIMER, QUANTITATIVE (NOT AT [facility])  FIBRINOGEN  POC SARS CORONAVIRUS 2 AG -  ED    EKG EKG Interpretation  Date/Time:  Monday December 01 2020 14:20:14 EST Ventricular Rate:  190 PR Interval:    QRS Duration: 82 QT Interval:  248 QTC Calculation: 432 R Axis:   82 Text Interpretation: afib with RVR Multiform ventricular premature complexes Borderline right axis deviation Repolarization abnormality, prob rate related Confirmed by Wandra Arthurs [phone]) on 12/01/2020 2:34:40 PM   Radiology DG Chest Port 1 View  Result Date:  12/01/2020 CLINICAL DATA:  Shortness of breath EXAM: PORTABLE CHEST 1 VIEW COMPARISON:  09/12/2020 FINDINGS: Median sternotomy with prosthetic cardiac valve and left atrial appendage clip. Stable heart size. Atherosclerotic calcification of the aortic knob. Interval development of patchy opacities within the peripheral aspects of the mid to lower lung fields bilaterally, left worse than right. No large pleural fluid collection. No pneumothorax. IMPRESSION: Interval development of patchy opacities within the peripheral aspects of the mid to lower lung fields bilaterally, left worse than right. Findings suggestive of multifocal atypical/viral pneumonia. Electronically Signed   By: Davina Poke D.O.   On: 12/01/2020 16:00    Procedures Procedures (including critical care time)  CRITICAL CARE Performed by: Wandra Arthurs   Total critical care time: 40 minutes  Critical care time was exclusive of separately billable procedures and treating other patients.  Critical care was necessary to treat or prevent imminent or life-threatening deterioration.  Critical care was time spent personally by me on the following activities: development of treatment plan with patient and/or surrogate as well as nursing, discussions with consultants, evaluation of patient's response to treatment, examination of patient, obtaining history from patient or surrogate, ordering and performing treatments and interventions, ordering and review of laboratory studies, ordering and review of radiographic studies, pulse oximetry and re-evaluation of patient's condition.   Medications Ordered in ED Medications  dexamethasone (DECADRON) injection 10 mg (has no administration in time range)  remdesivir 200 mg in sodium chloride 0.9% 250 mL IVPB (has no administration in time range)    Followed by  remdesivir 100 mg in sodium chloride 0.9 % 100 mL IVPB (has no administration in time range)  diltiazem (CARDIZEM) 125 mg in dextrose  5% 125 mL (1 mg/mL) infusion (5 mg/hr Intravenous New Bag/Given 12/01/20 1625)  potassium chloride 10 mEq in 100 mL IVPB (has no administration in time range)  potassium chloride SA (KLOR-CON) CR tablet 40 mEq (has no administration in time range)  diltiazem (CARDIZEM) injection 10 mg (10 mg Intravenous Given 12/01/20 1524)  acetaminophen (TYLENOL) tablet 650 mg (650 mg Oral Given 12/01/20 1525)    ED Course  I have reviewed the triage vital signs and the nursing notes.  Pertinent labs & imaging results that were available during my care of the patient were reviewed by me and considered in my medical decision making (see chart for details).    MDM Rules/Calculators/A&P                         RONNISHA FELBER is a 80 y.o. female here presenting with tachycardia and shortness of breath.  Patient was vaccinated for COVID and booster shot already. However she  still had COVID and is in rapid A. fib.  She also has low-grade temperature and is hypoxic requiring 3 L nasal cannula.  We will get [place] preadmission labs and chest x-ray.  Will start on Cardizem.   4:34 PM Patient's K is 2.8. CXR showed viral pneumonia. She is on cardizem drip. She is also on 3 L [place]. Ordered decadron, remdesimir.    Final Clinical Impression(s) / ED Diagnoses Final diagnoses:  None    Rx / DC Orders ED Discharge Orders    None       Drenda Freeze, MD 12/01/20 1638    Drenda Freeze, MD 12/01/20 [phone]

## 2020-12-01 NOTE — H&P (Addendum)
History and Physical    RANEEN JAFFER PQZ:[id number] DOB: 1941-01-05 DOA: 12/01/2020  PCP: [name], MD   Patient coming from: ALF    Chief Complaint: Shortness of breath  HPI: Michele Green is a 80 y.o. female with medical history significant of permanent A. fib, ulcerative colitis, vertebral compression fracture who has been fully vaccinated presented from [facility] with complaints of shortness of breath and cough.  She has been sick for last 3 days.  She recently tested positive at ALF . she said couple of residents over there are also sick.  No report of fever. On presentation she was in A. fib with RVR.  Heart rate was in the range of 180s.  She was hypoxic on room air saturating 86% which improved to 97% on 3 L.  She was afebrile on presentation. Patient seen and examined at the bedside in the emergency department.  During my evaluation, she was still in A. fib with RVR with heart rate in the range of 140s. She was able to speak in full sentences, complains of some shortness of breath but did not appear to be in severe respiratory distress. She denies any abdominal pain, nausea, vomiting, diarrhea, fever, chest pain, palpitations, hematochezia or melena.  Complains of severe weakness. Patient was admitted and was discharged from here on 09/19/2020 when she presented with back pain and was found to have T8 compression fracture and underwent kyphoplasty.  ED Course: Remained in A. fib with RVR.    Chest x-ray showed patchy opacities within the peripheral aspects of the mid to lower lung fields bilaterally, left worse than a moderately right suggestive of  multifocal atypical/viral pneumonia.Started on remdesivir and solumedrol.  Review of Systems: As per HPI otherwise 10 point review of systems negative.    Past Medical History:  Diagnosis Date  . Arrhythmia   . Atypical atrial flutter ([place]) 08/14/2018   Post-operative  . Breast cancer of upper-outer quadrant of left  female breast ([place]) 02/02/2016  . CAD (coronary artery disease) 06/20/2018   LHC 7/19: pLAD 65/90, oD1 90, mLCx 85, OM2 50, oRCA 30, EF 50-55 >> s/p CABG in 9/19 (L-LAD)  . Colon polyp 02/2012  . Dupuytren contracture    bilateral hands  . Family history of breast cancer   . Hyperlipidemia   . Hypertension   . Mitral regurgitation 11/07/2013   Echo 7/19: Mild LVH, EF 60-65, no RWMA, mod ot severe MR, massive LAE, PASP 33 // TEE 7/19:  Mild conc LVH, EF 60-65, no RWMA, severe MR with mild post leaflet prolapse, massive // s/p MV repair 07/2018  . On continuous oral anticoagulation 08/22/2015   Started on Xarelto 08/12/2015   . Osteopenia   . Persistent atrial fibrillation ([place]) 08/22/2015   Started late August or early September 2016 // s/p Maze procedure 07/2018  . Personal history of radiation therapy   . Radiation Therapy 04/21/16-05/19/16   left breast 47.72 Gy, boosted to 10 Gy  . 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 S/P Maze operation for atrial fibrillation 08/10/2018   Complete bilateral atrial lesion set using bipolar radiofrequency and cryothermy ablation with clipping of LA appendage  . S/P MVR (mitral valve repair) 08/10/2018   Complex valvuloplasty including Gore-tex neochord placement x8, Plication of Lateral Commissure and 48m Sorin Memo 4D Ring Annuloplasty SN# [id number] . Ulcerative colitis ([place]   . Wears glasses     Past Surgical History:  Procedure Laterality  Date  . BIOPSY  07/14/2020   Procedure: BIOPSY;  Surgeon: Ronnette Juniper, MD;  Location: [facility];  Service: Gastroenterology;;  . BREAST BIOPSY Left 01/28/2016  . BREAST LUMPECTOMY Left 02/23/2016  . BREAST LUMPECTOMY WITH RADIOACTIVE SEED LOCALIZATION Left 02/23/2016   Procedure: BREAST LUMPECTOMY WITH RADIOACTIVE SEED LOCALIZATION;  Surgeon: [name], MD;  Location: [place];  Service: General;  Laterality: Left;  . CARDIAC CATHETERIZATION    . CARDIOVERSION N/A 10/02/2015    Procedure: CARDIOVERSION;  Surgeon: Jerline Pain, MD;  Location: [facility]COPY;  Service: Cardiovascular;  Laterality: N/A;  . CARDIOVERSION N/A 10/05/2018   Procedure: CARDIOVERSION;  Surgeon: Fay Records, MD;  Location: [facility] ENDOSCOPY;  Service: Cardiovascular;  Laterality: N/A;  . CARDIOVERSION N/A 11/05/2019   Procedure: CARDIOVERSION;  Surgeon: Dorothy Spark, MD;  Location: [place];  Service: Cardiovascular;  Laterality: N/A;  . CLIPPING OF ATRIAL APPENDAGE  08/10/2018   Procedure: CLIPPING OF ATRIAL APPENDAGE;  Surgeon: Rexene Alberts, MD;  Location: [place];  Service: Open Heart Surgery;;  . COLONOSCOPY    . COLONOSCOPY WITH PROPOFOL N/A 07/14/2020   Procedure: COLONOSCOPY WITH PROPOFOL;  Surgeon: Ronnette Juniper, MD;  [facility]: [facility];  Service: Gastroenterology;  Laterality: N/A;  . CORONARY ARTERY BYPASS GRAFT N/A 08/10/2018   Procedure: CORONARY ARTERY BYPASS GRAFTING (CABG) x 1, LIMA-LAD,  USING LEFT INTERNAL MAMMARY ARTERY. HARVESTED RIGHT GREATER SAPHENOUS VEIN ENDOSCOPICALLY;  Surgeon: Rexene Alberts, MD;  Location: [employer];  Service: Open Heart Surgery;  Laterality: N/A;  . CYSTOSCOPY W/ RETROGRADES Bilateral 06/20/2019   Procedure: CYSTOSCOPY WITH RETROGRADE PYELOGRAM;  Surgeon: Ardis Hughs, MD;  Location: [facility];  Service: Urology;  Laterality: Bilateral;  . CYSTOSCOPY WITH HYDRODISTENSION AND BIOPSY N/A 06/20/2019   Procedure: CYSTOSCOPY BLADDER  BIOPSY WITH FULGERATION;  Surgeon: Ardis Hughs, MD;  Location: [facility];  Service: Urology;  Laterality: N/A;  . DUPUYTREN CONTRACTURE RELEASE  2001   leftx2  . [place]   right  . DUPUYTREN CONTRACTURE RELEASE Right 05/02/2014   Procedure: EXCISION DUPUYTRENS RIGHT PALMAR/SMALL ;  Surgeon: Cammie Sickle, MD;  Location: [place];  Service: Orthopedics;  Laterality: Right;  . HEMOSTASIS CLIP PLACEMENT  07/14/2020   Procedure:  HEMOSTASIS CLIP PLACEMENT;  Surgeon: Ronnette Juniper, MD;  Location: [facility];  Service: Gastroenterology;;  . IR VERTEBROPLASTY CERV/THOR BX INC UNI/BIL INC/INJECT/IMAGING  08/19/2020  . IR VERTEBROPLASTY CERV/THOR BX INC UNI/BIL INC/INJECT/IMAGING  09/15/2020  . MAZE N/A 08/10/2018   Procedure: MAZE;  Surgeon: Rexene Alberts, MD;  Location: [place];  Service: Open Heart Surgery;  Laterality: N/A;  . MITRAL VALVE REPAIR N/A 08/10/2018   Procedure: MITRAL VALVE REPAIR (MVR);  Surgeon: Rexene Alberts, MD;  Location: [place];  Service: Open Heart Surgery;  Laterality: N/A;  glutaraldehyde  . POLYPECTOMY  07/14/2020   Procedure: POLYPECTOMY;  Surgeon: Ronnette Juniper, MD;  Location: [facility];  Service: Gastroenterology;;  . RIGHT/LEFT HEART CATH AND CORONARY ANGIOGRAPHY N/A 06/20/2018   Procedure: RIGHT/LEFT HEART CATH AND CORONARY ANGIOGRAPHY;  Surgeon: [name], MD;  Location: [place] CV LAB;  Service: Cardiovascular;  Laterality: N/A;  . TEE WITHOUT CARDIOVERSION N/A 06/20/2018   Procedure: TRANSESOPHAGEAL ECHOCARDIOGRAM (TEE);  Surgeon: Pixie Casino, MD;  Location: [facility] ENDOSCOPY;  Service: Cardiovascular;  Laterality: N/A;  . TEE WITHOUT CARDIOVERSION N/A 08/10/2018   Procedure: TRANSESOPHAGEAL ECHOCARDIOGRAM (TEE);  Surgeon: Rexene Alberts,  MD;  Location: [facility];  Service: Open Heart Surgery;  Laterality: N/A;  . TONSILLECTOMY  age 49     reports that she quit smoking about 28 years ago. Her smoking use included cigarettes. She has a 20.00 pack-year smoking history. She has never used smokeless tobacco. She reports current alcohol use of about 3.0 standard drinks of alcohol per week. She reports that she does not use drugs.  Allergies  Allergen Reactions  . Shellfish Allergy Itching    Family History  Problem Relation Age of Onset  . Cirrhosis Mother   . Breast cancer Mother 62  . Heart failure Father   . Heart attack Father   . Breast cancer Sister        early 43's  .  Kidney Stones Sister        loss of kidney due to stones  . Osteoporosis Neg Hx      Prior to Admission medications   Medication Sig Start Date End Date Taking? Authorizing Provider  acetaminophen (TYLENOL) 500 MG tablet Take 500-1,000 mg by mouth every 6 (six) hours as needed for moderate pain or headache.     [provider]  allopurinol (ZYLOPRIM) 100 MG tablet Take 200 mg by mouth every evening.  06/21/13   [provider]  atorvastatin (LIPITOR) 20 MG tablet TAKE 1 TABLET BY MOUTH EVERY DAY Patient taking differently: Take 20 mg by mouth daily.  04/07/20   Camnitz, Ocie Doyne, MD  bisacodyl (DULCOLAX) 10 MG suppository Place 1 suppository (10 mg total) rectally daily as needed for moderate constipation. 09/19/20   Oswald Hillock, MD  cyclobenzaprine (FLEXERIL) 5 MG tablet Take 1 tablet (5 mg total) by mouth 3 (three) times daily as needed for muscle spasms. 09/19/20   Oswald Hillock, MD  digoxin (LANOXIN) 0.125 MG tablet TAKE ONE TABLET BY MOUTH DAILY MONDAY THROUGH FRIDAY. DO NOT TAKE ON SATURDAY OR SUNDAY. 10/28/20   [name], MD  docusate sodium (COLACE) 100 MG capsule Take 1 capsule (100 mg total) by mouth 2 (two) times daily. 09/19/20   Oswald Hillock, MD  feeding supplement, ENSURE ENLIVE, (ENSURE ENLIVE) LIQD Take 237 mLs by mouth 2 (two) times daily between meals. 08/23/20   Debbe Odea, MD  fentaNYL (DURAGESIC) 12 MCG/HR Place 1 patch onto the skin every 3 (three) days. 09/20/20   Oswald Hillock, MD  HUMIRA PEN 40 MG/0.4ML PNKT Inject 40 mg into the skin every 14 (fourteen) days.  09/09/20   [provider]  mesalamine (APRISO) 0.375 g 24 hr capsule Take 1.5 g by mouth daily.  06/01/19   [provider]  metoprolol tartrate (LOPRESSOR) 100 MG tablet TAKE 1 TABLET BY MOUTH TWICE A DAY 03/10/20   [name], MD  Multiple Vitamin (MULTIVITAMIN WITH MINERALS) TABS tablet Take 1 tablet by mouth daily. 08/24/20   Debbe Odea, MD  oxyCODONE (OXY  IR/ROXICODONE) 5 MG immediate release tablet Take 1 tablet (5 mg total) by mouth every 6 (six) hours as needed for severe pain. 09/19/20   Oswald Hillock, MD  potassium chloride SA (KLOR-CON M20) 20 MEQ tablet Take 1 tablet (20 mEq total) by mouth daily as needed. Take only when taking a Lasix 08/23/20   Debbe Odea, MD  predniSONE (DELTASONE) 10 MG tablet Take 20 mg by mouth daily. 08/11/20   [provider]  tamoxifen (NOLVADEX) 20 MG tablet Take 20 mg by mouth daily.  Patient not taking: Reported on 10/06/2020  [provider]  XARELTO 15 MG TABS tablet TAKE 1 TABLET BY MOUTH  DAILY WITH SUPPER 10/06/20   [name], MD    Physical Exam: Vitals:   12/01/20 1530 12/01/20 1600 12/01/20 1615 12/01/20 1700  BP: 129/84 (!) 118/101  117/67  Pulse: (!) 137 (!) 34 97 (!) 144  Resp: (!) 35 (!) 31 (!) 37 (!) 31  Temp:      TempSrc:      SpO2: 98% 96% 94% 97%  Weight:      Height:        Constitutional: Pleasant elderly female Vitals:   12/01/20 1530 12/01/20 1600 12/01/20 1615 12/01/20 1700  BP: 129/84 (!) 118/101  117/67  Pulse: (!) 137 (!) 34 97 (!) 144  Resp: (!) 35 (!) 31 (!) 37 (!) 31  Temp:      TempSrc:      SpO2: 98% 96% 94% 97%  Weight:      Height:       Eyes: PERRL, lids and conjunctivae normal ENMT: Mucous membranes are dry.  Neck: normal, supple, no masses, no thyromegaly Respiratory: bilateral crackles.  Cardiovascular: A. fib with RVR, no murmurs / rubs / gallops. No extremity edema.  Abdomen: no tenderness, no masses palpated. No hepatosplenomegaly. Bowel sounds positive.  Musculoskeletal: no clubbing / cyanosis. No joint deformity upper and lower extremities.  Skin: no rashes, lesions, ulcers. No induration Neurologic: CN 2-12 grossly intact.  Strength 5/5 in all 4.  Psychiatric: Normal judgment and insight. Alert and oriented x 3. Normal mood.   Foley Catheter:None  Labs on Admission: I have personally reviewed following labs and imaging  studies  CBC: Recent Labs  Lab 12/01/20 1424 12/01/20 1541  WBC 6.0  --   NEUTROABS 5.0  --   HGB 9.0* 10.9*  HCT 28.3* 32.0*  MCV 106.4*  --   PLT 161  --    Basic Metabolic Panel: Recent Labs  Lab 12/01/20 1424 12/01/20 1541  NA 145 147*  K 2.7* 2.8*  CL 108 109  CO2 23  --   GLUCOSE 91 86  BUN 19 17  CREATININE 1.06* 0.90  CALCIUM 8.1*  --    GFR: Estimated Creatinine Clearance: 42.3 mL/min (by C-G formula based on SCr of 0.9 mg/dL). Liver Function Tests: Recent Labs  Lab 12/01/20 1424  AST 27  ALT 22  ALKPHOS 45  BILITOT 1.9*  PROT 6.0*  ALBUMIN 2.4*   No results for input(s): LIPASE, AMYLASE in the last 168 hours. No results for input(s): AMMONIA in the last 168 hours. Coagulation Profile: No results for input(s): INR, PROTIME in the last 168 hours. Cardiac Enzymes: No results for input(s): CKTOTAL, CKMB, CKMBINDEX, TROPONINI in the last 168 hours. BNP (last 3 results) No results for input(s): PROBNP in the last 8760 hours. HbA1C: No results for input(s): HGBA1C in the last 72 hours. CBG: No results for input(s): GLUCAP in the last 168 hours. Lipid Profile: Recent Labs    12/01/20 1424  TRIG 131   Thyroid Function Tests: No results for input(s): TSH, T4TOTAL, FREET4, T3FREE, THYROIDAB in the last 72 hours. Anemia Panel: No results for input(s): VITAMINB12, FOLATE, FERRITIN, TIBC, IRON, RETICCTPCT in the last 72 hours. Urine analysis:    Component Value Date/Time   COLORURINE YELLOW 08/17/2020 [place] 08/17/2020 0434   LABSPEC 1.011 08/17/2020 0434   PHURINE 5.0 08/17/2020 [place] 08/17/2020 0434   HGBUR NEGATIVE 08/17/2020 0434  [place] NEGATIVE 08/17/2020 [place] 08/17/2020 0434   PROTEINUR NEGATIVE 08/17/2020 0434   NITRITE NEGATIVE 08/17/2020 [place] 08/17/2020 0434    Radiological Exams on Admission: DG Chest Port 1 View  Result Date:  12/01/2020 CLINICAL DATA:  Shortness of breath EXAM: PORTABLE CHEST 1 VIEW COMPARISON:  09/12/2020 FINDINGS: Median sternotomy with prosthetic cardiac valve and left atrial appendage clip. Stable heart size. Atherosclerotic calcification of the aortic knob. Interval development of patchy opacities within the peripheral aspects of the mid to lower lung fields bilaterally, left worse than right. No large pleural fluid collection. No pneumothorax. IMPRESSION: Interval development of patchy opacities within the peripheral aspects of the mid to lower lung fields bilaterally, left worse than right. Findings suggestive of multifocal atypical/viral pneumonia. Electronically Signed   By: Davina Poke D.O.   On: 12/01/2020 16:00     Assessment/Plan Principal Problem:   Pneumonia due to COVID-19 virus Active Problems:   Essential hypertension   Persistent atrial fibrillation (HCC)   Breast cancer of upper-outer quadrant of left female breast (HCC)   Chronic diastolic HF (heart failure) (HCC)   Atrial fibrillation with RVR (HCC)    COVID-pneumonia: Presented with cough, shortness of breath from ALF.  She is fully vaccinated including the booster dose.  Chest x-ray as above. Started on remdesivir and Solu-Medrol.  Patient has high risk for deterioration. She was hypoxic on presentation.  Currently saturation is optimal on 3 L of oxygen. Continue bronchodilators as needed, continue cough medications continue to monitor inflammatory markers.  A. fib with RVR: Has history of permanent A. fib.  On metoprolol and digoxin at home.  Follows with [name].  Was in A. fib with RVR on presentation.  Most likely precipitated due to acute respiratory distress.  Started on Cardizem drip.  We'll continue digoxin and metoprolol also.  Monitor on telemetry. She is on Xarelto for anticoagulation  history of chronic diastolic congestive heart failure: Currently appears dehydrated.  She has been started on gentle  hypotonic fluid.  Hyperlipidemia: on Lipitor at home.  History of ulcerative colitis: On mesalamine, prednisone at home.  Also takes Humira as an outpatient.  Follows with gastroenterology.  History of breast cancer: Follows with oncology.  Was taking tamoxifen but is on hold as per her oncologist recommendation  Patient history of compression vertebral fracture: Continue pain management and supportive care.  Physical therapy evaluation when appropriate.    Severity of Illness: The appropriate patient status for this patient is INPATIENT.  DVT prophylaxis: Xarelto Code Status: Full Family Communication: Updated Son on phone  Consults called: None     Shelly Coss MD [facility]  12/01/2020, 5:30 PM

## 2020-12-01 NOTE — ED Triage Notes (Signed)
Pt BIB [facility] from [name] for [facility]. Covid positive 3 days ago. SHOB x2 days. A&Ox4, some diarrhea, no vomiting. Denies fever, no other complaints. Mildly tachycardic, hx of a-fib, has not taken heart medication today.    BP 114/90 RR 30 HR 140 SpO2 86% RA, up to 97% on 3L CBG 115 Temp 98.2

## 2020-12-01 NOTE — ED Notes (Signed)
Date and time results received: 12/01/20 1707 (use smartphrase ".now" to insert current time)  Test:k+ Critical Value: 2.7  Name of Provider Notified: yao  Orders Received? Or Actions Taken?:

## 2020-12-02 ENCOUNTER — Other Ambulatory Visit: Payer: Self-pay

## 2020-12-02 DIAGNOSIS — I5032 Chronic diastolic (congestive) heart failure: Secondary | ICD-10-CM

## 2020-12-02 DIAGNOSIS — I4891 Unspecified atrial fibrillation: Secondary | ICD-10-CM

## 2020-12-02 DIAGNOSIS — I4819 Other persistent atrial fibrillation: Secondary | ICD-10-CM

## 2020-12-02 DIAGNOSIS — L8990 Pressure ulcer of unspecified site, unspecified stage: Secondary | ICD-10-CM | POA: Insufficient documentation

## 2020-12-02 LAB — GLUCOSE, CAPILLARY
Glucose-Capillary: 136 mg/dL — ABNORMAL HIGH (ref 70–99)
Glucose-Capillary: 109 mg/dL — ABNORMAL HIGH (ref 70–99)
Glucose-Capillary: 111 mg/dL — ABNORMAL HIGH (ref 70–99)
Glucose-Capillary: 126 mg/dL — ABNORMAL HIGH (ref 70–99)

## 2020-12-02 LAB — COMPREHENSIVE METABOLIC PANEL
ALT: 23 U/L (ref 0–44)
AST: 36 U/L (ref 15–41)
Albumin: 2.3 g/dL — ABNORMAL LOW (ref 3.5–5.0)
Alkaline Phosphatase: 43 U/L (ref 38–126)
Anion gap: 12 (ref 5–15)
BUN: 20 mg/dL (ref 8–23)
CO2: 17 mmol/L — ABNORMAL LOW (ref 22–32)
Calcium: 7.7 mg/dL — ABNORMAL LOW (ref 8.9–10.3)
Chloride: 111 mmol/L (ref 98–111)
Creatinine, Ser: 0.89 mg/dL (ref 0.44–1.00)
GFR, Estimated: >60 mL/min (ref >60)
Glucose, Bld: 138 mg/dL — ABNORMAL HIGH (ref 70–99)
Potassium: 4.2 mmol/L (ref 3.5–5.1)
Sodium: 140 mmol/L (ref 135–145)
Total Bilirubin: 1.5 mg/dL — ABNORMAL HIGH (ref 0.3–1.2)
Total Protein: 5.7 g/dL — ABNORMAL LOW (ref 6.5–8.1)

## 2020-12-02 LAB — CBC WITH DIFFERENTIAL/PLATELET
Abs Immature Granulocytes: 0.09 10*3/uL — ABNORMAL HIGH (ref 0.00–0.07)
Basophils Absolute: 0 10*3/uL (ref 0.0–0.1)
Basophils Relative: 0 %
Eosinophils Absolute: 0 10*3/uL (ref 0.0–0.5)
Eosinophils Relative: 0 %
HCT: 33.3 % — ABNORMAL LOW (ref 36.0–46.0)
Hemoglobin: 10.4 g/dL — ABNORMAL LOW (ref 12.0–15.0)
Immature Granulocytes: 2 %
Lymphocytes Relative: 12 %
Lymphs Abs: 0.6 10*3/uL — ABNORMAL LOW (ref 0.7–4.0)
MCH: 34 pg (ref 26.0–34.0)
MCHC: 31.2 g/dL (ref 30.0–36.0)
MCV: 108.8 fL — ABNORMAL HIGH (ref 80.0–100.0)
Monocytes Absolute: 0.1 10*3/uL (ref 0.1–1.0)
Monocytes Relative: 2 %
Neutro Abs: 4 10*3/uL (ref 1.7–7.7)
Neutrophils Relative %: 84 %
Platelets: 162 10*3/uL (ref 150–400)
RBC: 3.06 MIL/uL — ABNORMAL LOW (ref 3.87–5.11)
RDW: 14.8 % (ref 11.5–15.5)
WBC: 4.7 10*3/uL (ref 4.0–10.5)
nRBC: 0.8 % — ABNORMAL HIGH (ref 0.0–0.2)

## 2020-12-02 LAB — MAGNESIUM: Magnesium: 2 mg/dL (ref 1.7–2.4)

## 2020-12-02 LAB — FERRITIN: Ferritin: 268 ng/mL (ref 11–307)

## 2020-12-02 LAB — PHOSPHORUS: Phosphorus: 3.4 mg/dL (ref 2.5–4.6)

## 2020-12-02 LAB — C-REACTIVE PROTEIN: CRP: 11.2 mg/dL — ABNORMAL HIGH (ref <1.0)

## 2020-12-02 LAB — MRSA PCR SCREENING: MRSA by PCR: NEGATIVE

## 2020-12-02 LAB — D-DIMER, QUANTITATIVE: D-Dimer, Quant: 7.89 ug/mL-FEU — ABNORMAL HIGH (ref 0.00–0.50)

## 2020-12-02 MED ORDER — FUROSEMIDE 10 MG/ML IJ SOLN
20.0000 mg | Freq: Once | INTRAMUSCULAR | Status: AC
  Administered 2020-12-02: 20 mg via INTRAVENOUS
  Filled 2020-12-02: qty 2

## 2020-12-02 MED ORDER — ORAL CARE MOUTH RINSE
15.0000 mL | Freq: Two times a day (BID) | OROMUCOSAL | Status: DC
  Administered 2020-12-02 – 2020-12-05 (×7): 15 mL via OROMUCOSAL

## 2020-12-02 MED ORDER — CHLORHEXIDINE GLUCONATE CLOTH 2 % EX PADS
6.0000 | MEDICATED_PAD | Freq: Every day | CUTANEOUS | Status: DC
  Administered 2020-12-02 – 2020-12-04 (×5): 6 via TOPICAL

## 2020-12-02 MED ORDER — LIDOCAINE-PRILOCAINE 2.5-2.5 % EX CREA
TOPICAL_CREAM | Freq: Once | CUTANEOUS | Status: AC
  Filled 2020-12-02: qty 5

## 2020-12-02 NOTE — TOC Initial Note (Signed)
Transition of Care [facility]) - Initial/Assessment Note    Patient Details  Name: Michele Green MRN: [id number] Date of Birth: [birth year]  Transition of Care [facility]) CM/SW Contact:    Leeroy Cha, RN Phone Number: 12/02/2020, 10:29 AM  Clinical Narrative:                 80 y.o. female with medical history significant of permanent A. fib, ulcerative colitis, vertebral compression fracture who has been fully vaccinated presented from [facility] with complaints of shortness of breath and cough.  She has been sick for last 3 days.  She recently tested positive at ALF . she said couple of residents over there are also sick.  No report of fever. On presentation she was in A. fib with RVR.  Heart rate was in the range of 180s.  She was hypoxic on room air saturating 86% which improved to 97% on 3 L.  She was afebrile on presentation. Patient seen and examined at the bedside in the emergency department.  During my evaluation, she was still in A. fib with RVR with heart rate in the range of 140s. She was able to speak in full sentences, complains of some shortness of breath but did not appear to be in severe respiratory distress. She denies any abdominal pain, nausea, vomiting, diarrhea, fever, chest pain, palpitations, hematochezia or melena.  Complains of severe weakness. Patient was admitted and was discharged from here on 09/19/2020 when she presented with back pain and was found to have T8 compression fracture and underwent kyphoplasty.  ED Course: Remained in A. fib with RVR.    Chest x-ray showed patchy opacities within the peripheral aspects of the mid to lower lung fields bilaterally, left worse than a moderately right suggestive of  multifocal atypical/viral pneumonia.Started on remdesivir and solumedrol. PLAN: TOP RETURN TO HOME WITH SELF CARE LIVES ALONE PROGRESSION AS ABOVE, [place] AY 4L/MIN STARTED [id number] DOWN FROM HFNC, REMAINS ON IV CARDIZEM FOR A.FIB, REMDESVIRI THROUGH  [phone]. Expected Discharge Plan: Home/Self Care Barriers to Discharge: Continued Medical Work up   Patient Goals and CMS Choice Patient states their goals for this hospitalization and ongoing recovery are:: to go back to the house CMS Medicare.gov Compare Post Acute Care list provided to:: Patient    Expected Discharge Plan and Services Expected Discharge Plan: Home/Self Care   Discharge Planning Services: CM Consult   Living arrangements for the past 2 months: Single Family Home                                      Prior Living Arrangements/Services Living arrangements for the past 2 months: Single Family Home Lives with:: Self Patient language and need for interpreter reviewed:: Yes Do you feel safe going back to the place where you live?: Yes      Need for Family Participation in Patient Care: Yes (Comment) Care giver support system in place?: Yes (comment)   Criminal Activity/Legal Involvement Pertinent to Current Situation/Hospitalization: No - Comment as needed  Activities of Daily Living Home Assistive Devices/Equipment: [place] Rile (specify type) ADL Screening (condition at time of admission) Patient's cognitive ability adequate to safely complete daily activities?: Yes Is the patient deaf or have difficulty hearing?: No Does the patient have difficulty seeing, even when wearing glasses/contacts?: No Does the patient have difficulty concentrating, remembering, or making decisions?: No Patient able to express need for assistance with ADLs?:  Yes Does the patient have difficulty dressing or bathing?: No Independently performs ADLs?: No Communication: Independent Dressing (OT): Needs assistance Is this a change from baseline?: Pre-admission baseline Grooming: Needs assistance Is this a change from baseline?: Pre-admission baseline Feeding: Independent Bathing: Needs assistance Is this a change from baseline?: Pre-admission baseline Toileting: Needs  assistance Is this a change from baseline?: Pre-admission baseline In/Out Bed: Needs assistance Is this a change from baseline?: Pre-admission baseline Walks in Home: Needs assistance Is this a change from baseline?: Pre-admission baseline Does the patient have difficulty walking or climbing stairs?: No Weakness of Legs: Both Weakness of Arms/Hands: None  Permission Sought/Granted                  Emotional Assessment Appearance:: Appears stated age Attitude/Demeanor/Rapport: Engaged Affect (typically observed): Calm Orientation: : Oriented to Place,Oriented to Self,Oriented to  Time,Oriented to Situation Alcohol / Substance Use: Not Applicable Psych Involvement: No (comment)  Admission diagnosis:  Atrial fibrillation with RVR ([place]) [I48.91] Pneumonia due to COVID-19 virus [U07.1, J12.82] COVID-19 [U07.1] Patient Active Problem List   Diagnosis Date Noted  . Pressure injury of skin 12/02/2020  . Pneumonia due to COVID-19 virus 12/01/2020  . T8 vertebral fracture ([place]) 09/13/2020  . Compression fracture of C-spine ([place]) 09/12/2020  . Compression fracture of thoracic spine, non-traumatic ([place]) 09/12/2020  . Goals of care, counseling/discussion   . Palliative care by specialist   . Spinal fracture of T7 vertebra ([place]) 08/17/2020  . Atrial fibrillation with RVR ([place]) 08/16/2020  . Long term (current) use of anticoagulants 08/28/2018  . Atrial flutter with rapid ventricular response ([place]) 08/14/2018  . 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 S/P Maze operation for atrial fibrillation 08/10/2018  . Chronic diastolic HF (heart failure) ([place]) 06/20/2018  . Osteoporosis 09/29/2017  . Loosening of hardware in spine ([place]) 08/23/2017  . Spinal stenosis of lumbar region 08/11/2016  . Unspecified cord compression ([place]) 08/11/2016  . Unstable burst fracture of third lumbar vertebra ([place]) 08/11/2016  . Genetic testing 03/17/2016  . Family history of breast  cancer   . Breast cancer of upper-outer quadrant of left female breast ([place]) 02/02/2016  . On amiodarone therapy 09/09/2015  . On continuous oral anticoagulation 08/22/2015  . Persistent atrial fibrillation ([place]) 08/22/2015  . Essential hypertension 11/07/2013  . Mitral regurgitation 11/07/2013   PCP:  [name], MD Pharmacy:   [id number] [place] [place] [address]: [phone]: [phone]    Social Determinants of Health (SDOH) Interventions    Readmission Risk Interventions No flowsheet data found.

## 2020-12-02 NOTE — Progress Notes (Signed)
[facility]  PROGRESS NOTE  Michele Green DJS:[id number] DOB: 01-Jul-1941 DOA: 12/01/2020 PCP: [name], MD   Brief HPI:   80 year old female with medical history of permanent atrial fibrillation, ulcerative colitis, vertebral compression fracture who has been fully vaccinated against COVID-19 presented from [facility] with complaints of shortness of breath.  Patient has been sick for last 3 days.  She was recently tested positive for COVID-19 at ALF.  On presentation she was found to be in A. fib with RVR, hypoxemic on room air with 86% sats which improved to 97% on 3 L/min.  Patient was recently discharged from the hospital on 09/19/2020 when she presented with back pain and was found to have T8 compression fracture and underwent kyphoplasty. SARS-CoV-2 antigen test was positive. Chest x-ray showed patchy opacities with peripheral aspects of the mid to lower lung fields bilaterally left more than right.  Suggesting multifocal pneumonia.  Patient started on remdesivir and Solu-Medrol.    Subjective   Patient seen and examined, she is currently in A. fib with RVR.  Denies shortness of breath.  Oxygen has been weaned off to 2 L/min.   Assessment/Plan:     1. Acute hypoxemic respiratory failure-secondary to COVID 19 pneumonia.  Patient has been fully vaccinated including booster dose for COVID-19.  Chest x-ray showed multifocal pneumonia.  Patient started on remdesivir and Solu-Medrol.  CRP is 11.2, D-dimer 7.89.  Patient is on Xarelto for anticoagulation.  Oxygen has been weaned off to 2 L/min.  Will give 1 dose of Lasix 20 mg IV.  Follow inflammatory markers in AM.  Continue incentive spirometry, flutter valve. 2. Atrial fibrillation with RVR-patient has history of permanent A. fib.  She is on metoprolol and digoxin at home.  Was in A. fib with RVR on presentation.  Likely from acute respiratory failure as above.  Continue Cardizem drip.  We will wean off Cardizem drip as  tolerated.  Continue Xarelto for anticoagulation. 3. History of chronic diastolic heart failure-stable, patient received Lasix as above.  Will discontinue IV fluids.  Follow BMP in am. 4. Hyperlipidemia-continue Lipitor. 5. History of ulcerative colitis-continue mesalamine.  Patient takes Humira as outpatient.  Follow-up gastroenterology as outpatient. 6. History of breast cancer-follows with oncology.  Patient was taking tamoxifen but is currently on hold as per oncologist recommendation. 7. History of vertebral compression fracture-patient had T8 compression fracture with kyphoplasty.  Continue pain management.     COVID-19 Labs  Recent Labs    12/01/20 1424 12/01/20 1633 12/02/20 0252  DDIMER  --  6.31* 7.89*  FERRITIN  --  265 268  LDH 386*  --   --   CRP  --  11.0* 11.2*    Lab Results  Component Value Date   SARSCOV2NAA NEGATIVE 09/19/2020   [place] NEGATIVE 09/12/2020   [place] NEGATIVE 08/22/2020   [place] NEGATIVE 08/16/2020     Scheduled medications:   . albuterol  2 puff Inhalation Q6H  . vitamin C  500 mg Oral Daily  . atorvastatin  20 mg Oral QHS  . Chlorhexidine Gluconate Cloth  6 each Topical Daily  . digoxin  0.125 mg Oral Daily  . fentaNYL  1 patch Transdermal Q72H  . mouth rinse  15 mL Mouth Rinse BID  . mesalamine  1,500 mg Oral QHS  . methylPREDNISolone (SOLU-MEDROL) injection  40 mg Intravenous Q12H  . metoprolol tartrate  100 mg Oral BID  . Rivaroxaban  15 mg Oral Q supper  . zinc sulfate  220 mg Oral Daily         CBG: Recent Labs  Lab 12/02/20 0156 12/02/20 0856 12/02/20 1229  GLUCAP 136* 109* 111*    SpO2: 95 % O2 Flow Rate (L/min): 2 L/min    CBC: Recent Labs  Lab 12/01/20 1424 12/01/20 1541 12/02/20 0252  WBC 6.0  --  4.7  NEUTROABS 5.0  --  4.0  HGB 9.0* 10.9* 10.4*  HCT 28.3* 32.0* 33.3*  MCV 106.4*  --  108.8*  PLT 161  --  376    Basic Metabolic Panel: Recent Labs  Lab 12/01/20 1424  12/01/20 1541 12/02/20 0252  NA 145 147* 140  K 2.7* 2.8* 4.2  CL 108 109 111  CO2 23  --  17*  GLUCOSE 91 86 138*  BUN 19 17 20   CREATININE 1.06* 0.90 0.89  CALCIUM 8.1*  --  7.7*  MG  --   --  2.0  PHOS  --   --  3.4     Liver Function Tests: Recent Labs  Lab 12/01/20 1424 12/02/20 0252  AST 27 36  ALT 22 23  ALKPHOS 45 43  BILITOT 1.9* 1.5*  PROT 6.0* 5.7*  ALBUMIN 2.4* 2.3*     Antibiotics: Anti-infectives (From admission, onward)   Start     Dose/Rate Route Frequency Ordered Stop   12/02/20 1000  remdesivir 100 mg in sodium chloride 0.9 % 100 mL IVPB       "Followed by" Linked Group Details   100 mg 200 mL/hr over 30 Minutes Intravenous Daily 12/01/20 1512 12/06/20 0959   12/01/20 1515  remdesivir 200 mg in sodium chloride 0.9% 250 mL IVPB       "Followed by" Linked Group Details   200 mg 580 mL/hr over 30 Minutes Intravenous Once 12/01/20 1512 12/01/20 1840       DVT prophylaxis: Xarelto  Code Status: DNR  Family Communication: No family at bedside   Consultants:    Procedures:      Objective   Vitals:   12/02/20 1217 12/02/20 1230 12/02/20 1330 12/02/20 1430  BP:      Pulse: (!) 123 (!) 123 (!) 123 (!) 122  Resp: (!) 31 (!) 24 (!) 35 (!) 30  Temp:      TempSrc:      SpO2: 97% 99% 94% 95%  Weight:      Height:        Intake/Output Summary (Last 24 hours) at 12/02/2020 1450 Last data filed at 12/02/2020 1350 Gross per 24 hour  Intake 731.5 ml  Output 250 ml  Net 481.5 ml    01/09 1901 - 01/11 0700 In: 561.4 [I.V.:271.3] Out: 250 [Urine:250]  Filed Weights   12/01/20 1419 12/02/20 0200  Weight: 58.1 kg 56.7 kg    Physical Examination:    General-appears in no acute distress  Heart-irregularly irregular, no murmur auscultated  Lungs-clear to auscultation bilaterally, no wheezing or crackles auscultated  Abdomen-soft, nontender, no organomegaly  Extremities-no edema in the lower extremities  Neuro-alert,  oriented x3, no focal deficit noted   Status is: Inpatient  Dispo: The patient is from: ALF              Anticipated d/c is to: ALF              Anticipated d/c date is: 12/04/2020              Patient currently not medically stable for discharge  Barrier to discharge-acute  hypoxemic respiratory failure  Pressure Injury 12/02/20 Anus Medial Stage 1 -  Intact skin with non-blanchable redness of a localized area usually over a bony prominence. (Active)  12/02/20 0030  Location: Anus  Location Orientation: Medial  Staging: Stage 1 -  Intact skin with non-blanchable redness of a localized area usually over a bony prominence.  Wound Description (Comments):   Present on Admission: Yes           Data Reviewed:   Recent Results (from the past 240 hour(s))  Blood Culture (routine x 2)     Status: None (Preliminary result)   Collection Time: 12/01/20  4:33 PM   Specimen: BLOOD  Result Value Ref Range Status   Specimen Description   Final    BLOOD RIGHT ANTECUBITAL Performed at [facility] [address] [place], [place] [address]    Special Requests   Final    BOTTLES DRAWN AEROBIC AND ANAEROBIC Blood Culture adequate volume Performed at [facility] [address] [place], [place] [address]    Culture   Final    NO GROWTH < 12 HOURS Performed at [facility] [address] [place], [place] [address]    Report Status PENDING  Incomplete  Blood Culture (routine x 2)     Status: None (Preliminary result)   Collection Time: 12/01/20  4:33 PM   Specimen: BLOOD  Result Value Ref Range Status   Specimen Description   Final    BLOOD BLOOD RIGHT FOREARM Performed at [facility] [address], [place], [place] [address]    Special Requests   Final    BOTTLES DRAWN AEROBIC AND ANAEROBIC Blood Culture adequate volume Performed at [facility] [address], [place], [place] [address]     Culture   Final    NO GROWTH < 12 HOURS Performed at [facility] [address], [place],  [address]    Report Status PENDING  Incomplete  MRSA PCR Screening     Status: None   Collection Time: 12/02/20  1:32 AM   Specimen: Nasopharyngeal  Result Value Ref Range Status   MRSA by PCR NEGATIVE NEGATIVE Final    Comment:        The GeneXpert MRSA Assay (FDA approved for NASAL specimens only), is one component of a comprehensive MRSA colonization surveillance program. It is not intended to diagnose MRSA infection nor to guide or monitor treatment for MRSA infections. Performed at [facility], [place] [address], [place],  [address]     BNP (last 3 results) Recent Labs    05/17/20 1230 08/16/20 1452  BNP 237.4* 482.0*    ProBNP (last 3 results) No results for input(s): PROBNP in the last 8760 hours.  Studies:  DG Chest Port 1 View  Result Date: 12/01/2020 CLINICAL DATA:  Shortness of breath EXAM: PORTABLE CHEST 1 VIEW COMPARISON:  09/12/2020 FINDINGS: Median sternotomy with prosthetic cardiac valve and left atrial appendage clip. Stable heart size. Atherosclerotic calcification of the aortic knob. Interval development of patchy opacities within the peripheral aspects of the mid to lower lung fields bilaterally, left worse than right. No large pleural fluid collection. No pneumothorax. IMPRESSION: Interval development of patchy opacities within the peripheral aspects of the mid to lower lung fields bilaterally, left worse than right. Findings suggestive of multifocal atypical/viral pneumonia. Electronically Signed   By: Davina Poke D.O.   On: 12/01/2020 16:00  Oswald Hillock   [facility] If 7PM-7AM, please contact night-coverage at [email or web address], Office  [phone]   12/02/2020, 2:50 PM  LOS: 1 day

## 2020-12-02 NOTE — Plan of Care (Signed)
  Problem: Education: Goal: Knowledge of risk factors and measures for prevention of condition will improve Outcome: Progressing   Problem: Coping: Goal: Psychosocial and spiritual needs will be supported Outcome: Progressing   Problem: Respiratory: Goal: Will maintain a patent airway Outcome: Progressing Goal: Complications related to the disease process, condition or treatment will be avoided or minimized Outcome: Progressing   

## 2020-12-02 NOTE — ED Notes (Signed)
Call floor for report, nurse unavailable to take report at this time, will try back in a few minutes

## 2020-12-03 DIAGNOSIS — J9611 Chronic respiratory failure with hypoxia: Secondary | ICD-10-CM

## 2020-12-03 DIAGNOSIS — I10 Essential (primary) hypertension: Secondary | ICD-10-CM

## 2020-12-03 DIAGNOSIS — J9601 Acute respiratory failure with hypoxia: Secondary | ICD-10-CM

## 2020-12-03 LAB — FERRITIN: Ferritin: 361 ng/mL — ABNORMAL HIGH (ref 11–307)

## 2020-12-03 LAB — COMPREHENSIVE METABOLIC PANEL
ALT: 23 U/L (ref 0–44)
AST: 28 U/L (ref 15–41)
Albumin: 2.4 g/dL — ABNORMAL LOW (ref 3.5–5.0)
Alkaline Phosphatase: 44 U/L (ref 38–126)
Anion gap: 14 (ref 5–15)
BUN: 33 mg/dL — ABNORMAL HIGH (ref 8–23)
CO2: 20 mmol/L — ABNORMAL LOW (ref 22–32)
Calcium: 7.8 mg/dL — ABNORMAL LOW (ref 8.9–10.3)
Chloride: 105 mmol/L (ref 98–111)
Creatinine, Ser: 1.04 mg/dL — ABNORMAL HIGH (ref 0.44–1.00)
GFR, Estimated: 54 mL/min — ABNORMAL LOW (ref >60)
Glucose, Bld: 158 mg/dL — ABNORMAL HIGH (ref 70–99)
Potassium: 3.7 mmol/L (ref 3.5–5.1)
Sodium: 139 mmol/L (ref 135–145)
Total Bilirubin: 1.2 mg/dL (ref 0.3–1.2)
Total Protein: 5.5 g/dL — ABNORMAL LOW (ref 6.5–8.1)

## 2020-12-03 LAB — CBC WITH DIFFERENTIAL/PLATELET
Abs Immature Granulocytes: 0.11 10*3/uL — ABNORMAL HIGH (ref 0.00–0.07)
Basophils Absolute: 0 10*3/uL (ref 0.0–0.1)
Basophils Relative: 0 %
Eosinophils Absolute: 0 10*3/uL (ref 0.0–0.5)
Eosinophils Relative: 0 %
HCT: 36.9 % (ref 36.0–46.0)
Hemoglobin: 12.1 g/dL (ref 12.0–15.0)
Immature Granulocytes: 2 %
Lymphocytes Relative: 7 %
Lymphs Abs: 0.6 10*3/uL — ABNORMAL LOW (ref 0.7–4.0)
MCH: 33.6 pg (ref 26.0–34.0)
MCHC: 32.8 g/dL (ref 30.0–36.0)
MCV: 102.5 fL — ABNORMAL HIGH (ref 80.0–100.0)
Monocytes Absolute: 0.3 10*3/uL (ref 0.1–1.0)
Monocytes Relative: 4 %
Neutro Abs: 6.4 10*3/uL (ref 1.7–7.7)
Neutrophils Relative %: 87 %
Platelets: 135 10*3/uL — ABNORMAL LOW (ref 150–400)
RBC: 3.6 MIL/uL — ABNORMAL LOW (ref 3.87–5.11)
RDW: 14.7 % (ref 11.5–15.5)
WBC: 7.4 10*3/uL (ref 4.0–10.5)
nRBC: 0.9 % — ABNORMAL HIGH (ref 0.0–0.2)

## 2020-12-03 LAB — PHOSPHORUS: Phosphorus: 4.1 mg/dL (ref 2.5–4.6)

## 2020-12-03 LAB — MAGNESIUM: Magnesium: 1.9 mg/dL (ref 1.7–2.4)

## 2020-12-03 LAB — C-REACTIVE PROTEIN: CRP: 6.6 mg/dL — ABNORMAL HIGH (ref <1.0)

## 2020-12-03 LAB — D-DIMER, QUANTITATIVE: D-Dimer, Quant: 3.56 ug/mL-FEU — ABNORMAL HIGH (ref 0.00–0.50)

## 2020-12-03 MED ORDER — AMIODARONE HCL IN DEXTROSE 360-4.14 MG/200ML-% IV SOLN
30.0000 mg/h | INTRAVENOUS | Status: DC
  Administered 2020-12-03: 30 mg/h via INTRAVENOUS

## 2020-12-03 MED ORDER — AMIODARONE HCL 200 MG PO TABS
400.0000 mg | ORAL_TABLET | Freq: Once | ORAL | Status: AC
  Administered 2020-12-03: 400 mg via ORAL
  Filled 2020-12-03: qty 2

## 2020-12-03 MED ORDER — QUETIAPINE FUMARATE 50 MG PO TABS: 25.0000 mg | ORAL_TABLET | Freq: Every evening | ORAL | Status: DC | PRN

## 2020-12-03 MED ORDER — SODIUM CHLORIDE 0.9 % IV SOLN: INTRAVENOUS | Status: AC

## 2020-12-03 MED ORDER — AMIODARONE HCL 200 MG PO TABS: 200.0000 mg | ORAL_TABLET | Freq: Every day | ORAL | Status: DC

## 2020-12-03 MED ORDER — AMIODARONE LOAD VIA INFUSION
150.0000 mg | Freq: Once | INTRAVENOUS | Status: AC
  Administered 2020-12-03: 150 mg via INTRAVENOUS
  Filled 2020-12-03: qty 83.34

## 2020-12-03 MED ORDER — SODIUM CHLORIDE 0.9 % IV BOLUS
500.0000 mL | Freq: Once | INTRAVENOUS | Status: AC
  Administered 2020-12-03: 500 mL via INTRAVENOUS

## 2020-12-03 MED ORDER — AMIODARONE HCL IN DEXTROSE 360-4.14 MG/200ML-% IV SOLN
60.0000 mg/h | INTRAVENOUS | Status: AC
  Administered 2020-12-03 (×2): 60 mg/h via INTRAVENOUS
  Filled 2020-12-03: qty 200

## 2020-12-03 MED ORDER — ALBUTEROL SULFATE HFA 108 (90 BASE) MCG/ACT IN AERS
2.0000 | INHALATION_SPRAY | Freq: Three times a day (TID) | RESPIRATORY_TRACT | Status: DC
  Filled 2020-12-03: qty 6.7

## 2020-12-03 MED ORDER — MELATONIN 3 MG PO TABS
3.0000 mg | ORAL_TABLET | Freq: Every day | ORAL | Status: DC
  Administered 2020-12-03 – 2020-12-04 (×2): 3 mg via ORAL
  Filled 2020-12-03 (×2): qty 1

## 2020-12-03 MED ORDER — METOPROLOL TARTRATE 25 MG PO TABS: 50.0000 mg | ORAL_TABLET | Freq: Two times a day (BID) | ORAL | Status: DC

## 2020-12-03 NOTE — Progress Notes (Signed)
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 Treated 2017 with lumpectomy and course of tomoxifen. No recurrrence with mammogram surveillance.  Restriction of left arm use for IV access lifted.

## 2020-12-03 NOTE — Progress Notes (Signed)
This IV [facility]ast nurse at bedside for IV start per consult, pt. A/O x1 w/ sitter at bedside, pt.  Informed of the IV start but pt. Declined ,stated "No!." explained procedure to pt. And that left arm is "ok " by MD for IV start, pt. Still declined the said procedure. RN at bedside and educated pt.,still pt. Declined. RN to notify MD.

## 2020-12-03 NOTE — Progress Notes (Signed)
Cross Cover Patient adamantly refusing IV access attempts. Oral amiodarone started to replace IV

## 2020-12-03 NOTE — Progress Notes (Signed)
Patient arm assessed with ultrasound for PIV; possible PICC vs midline.  Left arm restricted with large amount bruising.  Right arm very swollen with busing hematoma up to above mid upper arm.  With ultrasound not able to locate cephalic or basilic veins.  Only vein noticed is brachial in location not accessible for PICC or midline.  Would recommend central line insertion or referral to IR if venous access required.

## 2020-12-03 NOTE — Progress Notes (Signed)
[facility] PROGRESS NOTE    Progress Note  Michele Green  JOI:[id number] DOB: 11/05/1941 DOA: 12/01/2020 PCP: [name], MD     Brief Narrative:   Michele Green is an 80 y.o. female atrial fibrillation on anticoagulation, ulcerative colitis vertebral column of pressure infection was fully vaccinated presents from assisted living facility for 3 days of not feeling well tested positive, presentation was found to be in A. fib with RVR and hypoxia required 3 L of oxygen to keep saturations greater than 90%.  Chest x-ray showed bilateral patchy infiltrates. He was started on IV remdesivir and steroids.  Assessment/Plan:   Acute respiratory failure with hypoxia secondarily to Pneumonia due to COVID-19 virus: Chest x-ray showed bilateral pulmonary infiltrates. Try to keep saturations greater than 94%. She was started on IV remdesivir and steroids.  Inflammatory markers are trending down. She is requiring minimal oxygen overnight to keep saturations greater than 90%. This morning she has been weaned to room air satting 93%. She is at high risk of aspiration and developing acute confusional state.  A. fib with RVR: She is currently on digoxin, on admission was started on IV diltiazem and metoprolol. She is mildly hypotensive, with a rise in her creatinine we will give her a bolus of normal saline and load her up with amiodarone. Discontinue metoprolol. Continue Xarelto.  History of chronic diastolic heart failure: She received Lasix on admission IV fluids discontinued. Will give a bolus of normal saline as she is mildly hypotensive.  History of ulcerative colitis: Continue mesalamine patient is on Humira as an outpatient.  History of breast cancer: Follow-up with oncology as an outpatient.  History of vertebral compression fracture and T8 status post kyphosis plasty in previous admission: Continue pain management.  Essential hypertension Hold all antihypertensive  medication she is mildly hypotensive.  Pressure injury of skin stage I present on admission: RN Pressure Injury Documentation: Pressure Injury 12/02/20 Anus Medial Stage 1 -  Intact skin with non-blanchable redness of a localized area usually over a bony prominence. (Active)  12/02/20 0030  Location: Anus  Location Orientation: Medial  Staging: Stage 1 -  Intact skin with non-blanchable redness of a localized area usually over a bony prominence.  Wound Description (Comments):   Present on Admission: Yes    DVT prophylaxis: Xarelto Family Communication:none Status is: Inpatient  Remains inpatient appropriate because:Hemodynamically unstable   Dispo: The patient is from: ALF              Anticipated d/c is to: SNF              Anticipated d/c date is: > 3 days              Patient currently is not medically stable to d/c.        Code Status:     Code Status Orders  (From admission, onward)         Start     Ordered   12/01/20 1729  Do not attempt resuscitation (DNR)  Continuous       Question Answer Comment  In the event of cardiac or respiratory ARREST Do not call a "code blue"   In the event of cardiac or respiratory ARREST Do not perform Intubation, CPR, defibrillation or ACLS   In the event of cardiac or respiratory ARREST Use medication by any route, position, wound care, and other measures to relive pain and suffering. May use oxygen, suction and manual treatment of airway obstruction as  needed for comfort.      12/01/20 1728        Code Status History    Date Active Date Inactive Code Status Order ID Comments User Context   12/01/2020 1728 12/01/2020 1728 Full Code [id number]  Shelly Coss, MD ED   09/12/2020 1727 09/20/2020 0201 DNR [id number]  Jonnie Finner, DO ED   08/22/2020 1608 08/23/2020 1619 DNR [id number]  Rosezella Rumpf, NP Inpatient   08/17/2020 0354 08/22/2020 1608 Full Code [id number]  Kayleen Memos, DO Inpatient   08/10/2018 1428 08/17/2018  1741 Full Code [id number]  Rexene Alberts, MD Inpatient   06/20/2018 1312 06/20/2018 2022 Full Code [id number]  [name], MD Inpatient   08/23/2017 1333 08/24/2017 1731 Full Code [id number]  Traci Sermon, PA-C Inpatient   Advance Care Planning Activity        IV Access:    Peripheral IV   Procedures and diagnostic studies:   DG Chest Port 1 View  Result Date: 12/01/2020 CLINICAL DATA:  Shortness of breath EXAM: PORTABLE CHEST 1 VIEW COMPARISON:  09/12/2020 FINDINGS: Median sternotomy with prosthetic cardiac valve and left atrial appendage clip. Stable heart size. Atherosclerotic calcification of the aortic knob. Interval development of patchy opacities within the peripheral aspects of the mid to lower lung fields bilaterally, left worse than right. No large pleural fluid collection. No pneumothorax. IMPRESSION: Interval development of patchy opacities within the peripheral aspects of the mid to lower lung fields bilaterally, left worse than right. Findings suggestive of multifocal atypical/viral pneumonia. Electronically Signed   By: Davina Poke D.O.   On: 12/01/2020 16:00     Medical Consultants:    None.  Anti-Infectives:   none  Subjective:    Michele Green she relates she feels really tired she has not gotten any rest relates her breathing is about the same.  Objective:    Vitals:   12/03/20 0600 12/03/20 0610 12/03/20 0700 12/03/20 0710  BP: (!) 73/44 (!) 67/49 (!) 65/40 (!) 86/53  Pulse: (!) 124 (!) 123 (!) 125 (!) 126  Resp: (!) 40 (!) 24 (!) 21 (!) 23  Temp:      TempSrc:      SpO2: 94% 100% 93% 94%  Weight:      Height:       SpO2: 94 % O2 Flow Rate (L/min): 3 L/min   Intake/Output Summary (Last 24 hours) at 12/03/2020 0725 Last data filed at 12/03/2020 0700 Gross per 24 hour  Intake 540.78 ml  Output --  Net 540.78 ml   Filed Weights   12/01/20 1419 12/02/20 0200  Weight: 58.1 kg 56.7 kg    Exam: General exam: In no acute  distress. Respiratory system: Good air movement and clear to auscultation. Cardiovascular system: S1 & S2 heard, RRR. No JVD. Gastrointestinal system: Abdomen is nondistended, soft and nontender.  Extremities: No pedal edema. Skin: No rashes, lesions or ulcers Psychiatry: Judgement and insight appear normal. Mood & affect appropriate.   Data Reviewed:    Labs: Basic Metabolic Panel: Recent Labs  Lab 12/01/20 1424 12/01/20 1541 12/02/20 0252 12/03/20 0440  NA 145 147* 140 139  K 2.7* 2.8* 4.2 3.7  CL 108 109 111 105  CO2 23  --  17* 20*  GLUCOSE 91 86 138* 158*  BUN 19 17 20  33*  CREATININE 1.06* 0.90 0.89 1.04*  CALCIUM 8.1*  --  7.7* 7.8*  MG  --   --  2.0 1.9  PHOS  --   --  3.4 4.1   GFR Estimated Creatinine Clearance: 36.6 mL/min (A) (by C-G formula based on SCr of 1.04 mg/dL (H)). Liver Function Tests: Recent Labs  Lab 12/01/20 1424 12/02/20 0252 12/03/20 0440  AST 27 36 28  ALT 22 23 23   ALKPHOS 45 43 44  BILITOT 1.9* 1.5* 1.2  PROT 6.0* 5.7* 5.5*  ALBUMIN 2.4* 2.3* 2.4*   No results for input(s): LIPASE, AMYLASE in the last 168 hours. No results for input(s): AMMONIA in the last 168 hours. Coagulation profile No results for input(s): INR, PROTIME in the last 168 hours. COVID-19 Labs  Recent Labs    12/01/20 1424 12/01/20 1633 12/02/20 0252 12/03/20 0440  DDIMER  --  6.31* 7.89* 3.56*  FERRITIN  --  265 268  --   LDH 386*  --   --   --   CRP  --  11.0* 11.2*  --     Lab Results  Component Value Date   SARSCOV2NAA NEGATIVE 09/19/2020   [place] NEGATIVE 09/12/2020   [place] NEGATIVE 08/22/2020   [place] NEGATIVE 08/16/2020    CBC: Recent Labs  Lab 12/01/20 1424 12/01/20 1541 12/02/20 0252 12/03/20 0440  WBC 6.0  --  4.7 7.4  NEUTROABS 5.0  --  4.0 6.4  HGB 9.0* 10.9* 10.4* 12.1  HCT 28.3* 32.0* 33.3* 36.9  MCV 106.4*  --  108.8* 102.5*  PLT 161  --  162 135*   Cardiac Enzymes: No results for input(s): CKTOTAL, CKMB,  CKMBINDEX, TROPONINI in the last 168 hours. BNP (last 3 results) No results for input(s): PROBNP in the last 8760 hours. CBG: Recent Labs  Lab 12/02/20 0156 12/02/20 0856 12/02/20 1229 12/02/20 1705  GLUCAP 136* 109* 111* 126*   D-Dimer: Recent Labs    12/02/20 0252 12/03/20 0440  DDIMER 7.89* 3.56*   Hgb A1c: No results for input(s): HGBA1C in the last 72 hours. Lipid Profile: Recent Labs    12/01/20 1424  TRIG 131   Thyroid function studies: No results for input(s): TSH, T4TOTAL, T3FREE, THYROIDAB in the last 72 hours.  Invalid input(s): FREET3 Anemia work up: Recent Labs    12/01/20 1633 12/02/20 0252  FERRITIN 265 268   Sepsis Labs: Recent Labs  Lab 12/01/20 1424 12/01/20 1633 12/02/20 0252 12/03/20 0440  PROCALCITON 0.23  --   --   --   WBC 6.0  --  4.7 7.4  LATICACIDVEN  --  1.3  --   --    Microbiology Recent Results (from the past 240 hour(s))  Blood Culture (routine x 2)     Status: None (Preliminary result)   Collection Time: 12/01/20  4:33 PM   Specimen: BLOOD  Result Value Ref Range Status   Specimen Description   Final    BLOOD RIGHT ANTECUBITAL Performed at [facility], [place] [address], [place], [place] [address]    Special Requests   Final    BOTTLES DRAWN AEROBIC AND ANAEROBIC Blood Culture adequate volume Performed at [facility] [address], [place], [place] [address]    Culture   Final    NO GROWTH 2 DAYS Performed at [facility] [address] [place], [place] [address]    Report Status PENDING  Incomplete  Blood Culture (routine x 2)     Status: None (Preliminary result)   Collection Time: 12/01/20  4:33 PM   Specimen: BLOOD  Result Value Ref Range Status   Specimen Description   Final  BLOOD BLOOD RIGHT FOREARM Performed at [facility] [address], [place], [place] [address]    Special Requests   Final    BOTTLES DRAWN AEROBIC AND ANAEROBIC  Blood Culture adequate volume Performed at [facility] [address], [place], [place] [address]    Culture   Final    NO GROWTH 2 DAYS Performed at [facility] [address], [place], [place] [address]    Report Status PENDING  Incomplete  MRSA PCR Screening     Status: None   Collection Time: 12/02/20  1:32 AM   Specimen: Nasopharyngeal  Result Value Ref Range Status   MRSA by PCR NEGATIVE NEGATIVE Final    Comment:        The GeneXpert MRSA Assay (FDA approved for NASAL specimens only), is one component of a comprehensive MRSA colonization surveillance program. It is not intended to diagnose MRSA infection nor to guide or monitor treatment for MRSA infections. Performed at [facility], [place] [address] [place], [place] [address]      Medications:   . albuterol  2 puff Inhalation Q6H  . vitamin C  500 mg Oral Daily  . atorvastatin  20 mg Oral QHS  . Chlorhexidine Gluconate Cloth  6 each Topical Daily  . digoxin  0.125 mg Oral Daily  . fentaNYL  1 patch Transdermal Q72H  . mouth rinse  15 mL Mouth Rinse BID  . mesalamine  1,500 mg Oral QHS  . methylPREDNISolone (SOLU-MEDROL) injection  40 mg Intravenous Q12H  . metoprolol tartrate  100 mg Oral BID  . Rivaroxaban  15 mg Oral Q supper  . zinc sulfate  220 mg Oral Daily   Continuous Infusions: . diltiazem (CARDIZEM) infusion 10 mg/hr (12/03/20 0700)  . remdesivir 100 mg in NS 100 mL Stopped (12/02/20 0901)      LOS: 2 days   Charlynne Cousins  [facility]  12/03/2020, 7:25 AM

## 2020-12-04 ENCOUNTER — Inpatient Hospital Stay: Payer: Self-pay

## 2020-12-04 MED ORDER — PREDNISONE 50 MG PO TABS
50.0000 mg | ORAL_TABLET | Freq: Every day | ORAL | Status: DC
  Administered 2020-12-05: 50 mg via ORAL
  Filled 2020-12-04: qty 1

## 2020-12-04 MED ORDER — AMIODARONE LOAD VIA INFUSION
150.0000 mg | Freq: Once | INTRAVENOUS | Status: DC
  Filled 2020-12-04: qty 83.34

## 2020-12-04 MED ORDER — AMIODARONE HCL 200 MG PO TABS
400.0000 mg | ORAL_TABLET | Freq: Two times a day (BID) | ORAL | Status: DC
  Administered 2020-12-04 – 2020-12-05 (×3): 400 mg via ORAL
  Filled 2020-12-04 (×4): qty 2

## 2020-12-04 MED ORDER — AMIODARONE HCL IN DEXTROSE 360-4.14 MG/200ML-% IV SOLN: 60.0000 mg/h | INTRAVENOUS | Status: DC

## 2020-12-04 MED ORDER — AMIODARONE HCL IN DEXTROSE 360-4.14 MG/200ML-% IV SOLN: 30.0000 mg/h | INTRAVENOUS | Status: DC

## 2020-12-04 MED ORDER — SODIUM CHLORIDE 0.9 % IV SOLN: INTRAVENOUS | Status: AC

## 2020-12-04 MED ORDER — SODIUM CHLORIDE 0.9 % IV BOLUS: 1000.0000 mL | Freq: Once | INTRAVENOUS | Status: DC

## 2020-12-04 NOTE — Progress Notes (Signed)
Pt has had no IV access today due to refusing to be stuck. The PICC team came and tried to get her to agree to a PICC but she refused. The MD was made aware.

## 2020-12-04 NOTE — Progress Notes (Signed)
At bedside to insert PICC ,pt refuse the procedure .Explained the benefits of having PICC but still refused and stated she wants to go home. Called Son and stated "My Mom says No ,so its NO".Primary RN talked to son.

## 2020-12-04 NOTE — Progress Notes (Signed)
Pt is in afib and HR tacked up into the 140s. MD was made aware and the pt is still refusing an IV or a PICC be placed at this time. Will continue to monitor.

## 2020-12-04 NOTE — Progress Notes (Addendum)
[facility] PROGRESS NOTE    Progress Note  Michele Green  IOX:[id number] DOB: Jul 23, 1941 DOA: 12/01/2020 PCP: [name], MD     Brief Narrative:   Michele Green is an 80 y.o. female atrial fibrillation on anticoagulation, ulcerative colitis vertebral column of pressure infection was fully vaccinated presents from assisted living facility for 3 days of not feeling well tested positive, presentation was found to be in A. fib with RVR and hypoxia required 3 L of oxygen to keep saturations greater than 90%.  Chest x-ray showed bilateral patchy infiltrates. He was started on IV remdesivir and steroids.  Assessment/Plan:   Acute respiratory failure with hypoxia secondarily to Pneumonia due to COVID-19 virus: Still hypoxic requiring 3 L of oxygen keep saturations greater than 94%. Continue IV steroids and IV remdesivir. She lost her IV overnight, PICC line to be inserted today. She has refused labs inflammatory markers are pending this morning. She is at high risk of aspiration and developing acute confusional state. She has had poor urine output with decreased oral intake, restart IV fluids once line inserted. Patient relates she does not want to do this I will go ahead and speak with the family about trying to move towards comfort care I explained the patient that her renal continue to treat her, she will pass away in the hospital.  She relates she does not do this anymore. Will consult [facility].  A. fib with RVR: She was loaded with amiodarone but she lost her IV continue amiodarone, now on orally, off the diltiazem drip on metoprolol orally. She did not finish her amnio load we will go ahead and reload her with amiodarone. She is refusing her labs. She refused lines ans even a PICC line I have informed the son of this. If she does not want anymore treatment will have to move towards comfort care. Increase her her amio orally to 400 mg po BID. Continue  Xarelto.  History of chronic diastolic heart failure: She received Lasix on admission IV fluids discontinued. She refused iv line cannot provide fluid bolus.  History of ulcerative colitis: Continue mesalamine patient is on Humira as an outpatient.  History of breast cancer: Follow-up with oncology as an outpatient.  History of vertebral compression fracture and T8 status post kyphosis plasty in previous admission: Continue pain management.  Essential hypertension Blood pressure is improved continue to hold diltiazem and metoprolol continue to load her with amiodarone.  Pressure injury of skin stage I present on admission: RN Pressure Injury Documentation: Pressure Injury 12/02/20 Anus Medial Stage 1 -  Intact skin with non-blanchable redness of a localized area usually over a bony prominence. (Active)  12/02/20 0030  Location: Anus  Location Orientation: Medial  Staging: Stage 1 -  Intact skin with non-blanchable redness of a localized area usually over a bony prominence.  Wound Description (Comments):   Present on Admission: Yes    DVT prophylaxis: Xarelto Family Communication:none Status is: Inpatient  Remains inpatient appropriate because:Hemodynamically unstable   Dispo: The patient is from: ALF              Anticipated d/c is to: SNF              Anticipated d/c date is: > 3 days              Patient currently is not medically stable to d/c.        Code Status:     Code Status Orders  (From admission,  onward)         Start     Ordered   12/01/20 1729  Do not attempt resuscitation (DNR)  Continuous       Question Answer Comment  In the event of cardiac or respiratory ARREST Do not call a "code blue"   In the event of cardiac or respiratory ARREST Do not perform Intubation, CPR, defibrillation or ACLS   In the event of cardiac or respiratory ARREST Use medication by any route, position, wound care, and other measures to relive pain and suffering. May  use oxygen, suction and manual treatment of airway obstruction as needed for comfort.      12/01/20 1728        Code Status History    Date Active Date Inactive Code Status Order ID Comments User Context   12/01/2020 1728 12/01/2020 1728 Full Code [id number]  Shelly Coss, MD ED   09/12/2020 1727 09/20/2020 0201 DNR [id number]  Jonnie Finner, DO ED   08/22/2020 1608 08/23/2020 1619 DNR [id number]  Rosezella Rumpf, NP Inpatient   08/17/2020 0354 08/22/2020 1608 Full Code [id number]  Kayleen Memos, DO Inpatient   08/10/2018 1428 08/17/2018 1741 Full Code [id number]  Rexene Alberts, MD Inpatient   06/20/2018 1312 06/20/2018 2022 Full Code [id number]  [name], MD Inpatient   08/23/2017 1333 08/24/2017 1731 Full Code [id number]  Traci Sermon, PA-C Inpatient   Advance Care Planning Activity        IV Access:    Peripheral IV   Procedures and diagnostic studies:   [place] EKG SITE RITE  Result Date: 12/04/2020 If Site Rite image not attached, placement could not be confirmed due to current cardiac rhythm.    Medical Consultants:    None.  Anti-Infectives:   none  Subjective:    Michele Green relates she wants to stop doing days, she does not want any more lab draws she just wants to be left alone and comfortable. Objective:    Vitals:   12/04/20 0300 12/04/20 0400 12/04/20 0500 12/04/20 0600  BP: (!) 111/48 (!) 116/50 107/63 (!) 101/59  Pulse: (!) 112 (!) 116 (!) 108 (!) 112  Resp: 19 19 (!) 24 17  Temp:      TempSrc:      SpO2: 90% 93% (!) 86% 95%  Weight:      Height:       SpO2: 95 % O2 Flow Rate (L/min): 3 L/min   Intake/Output Summary (Last 24 hours) at 12/04/2020 0659 Last data filed at 12/04/2020 0500 Gross per 24 hour  Intake 1392.28 ml  Output 200 ml  Net 1192.28 ml   Filed Weights   12/01/20 1419 12/02/20 0200  Weight: 58.1 kg 56.7 kg    Exam: General exam: In no acute distress, she is refusing all her labs she relates she does not  want to be stick anymore. Respiratory system: Good air movement and clear to auscultation. Cardiovascular system: S1 & S2 heard, RRR. No JVD. Gastrointestinal system: Abdomen is nondistended, soft and nontender.  Extremities: Bruised arms bilaterally Skin: No rashes, lesions or ulcers Psychiatry: Judgement and insight appear normal.   Data Reviewed:    Labs: Basic Metabolic Panel: Recent Labs  Lab 12/01/20 1424 12/01/20 1541 12/02/20 0252 12/03/20 0440  NA 145 147* 140 139  K 2.7* 2.8* 4.2 3.7  CL 108 109 111 105  CO2 23  --  17* 20*  GLUCOSE 91 86 138* 158*  BUN 19  17 20 33*  CREATININE 1.06* 0.90 0.89 1.04*  CALCIUM 8.1*  --  7.7* 7.8*  MG  --   --  2.0 1.9  PHOS  --   --  3.4 4.1   GFR Estimated Creatinine Clearance: 36.6 mL/min (A) (by C-G formula based on SCr of 1.04 mg/dL (H)). Liver Function Tests: Recent Labs  Lab 12/01/20 1424 12/02/20 0252 12/03/20 0440  AST 27 36 28  ALT 22 23 23   ALKPHOS 45 43 44  BILITOT 1.9* 1.5* 1.2  PROT 6.0* 5.7* 5.5*  ALBUMIN 2.4* 2.3* 2.4*   No results for input(s): LIPASE, AMYLASE in the last 168 hours. No results for input(s): AMMONIA in the last 168 hours. Coagulation profile No results for input(s): INR, PROTIME in the last 168 hours. COVID-19 Labs  Recent Labs    12/01/20 1424 12/01/20 1633 12/02/20 0252 12/03/20 0440  DDIMER  --  6.31* 7.89* 3.56*  FERRITIN  --  265 268 361*  LDH 386*  --   --   --   CRP  --  11.0* 11.2* 6.6*    Lab Results  Component Value Date   SARSCOV2NAA NEGATIVE 09/19/2020   [place] NEGATIVE 09/12/2020   SARSCOV2NAA NEGATIVE 08/22/2020   [place] NEGATIVE 08/16/2020    CBC: Recent Labs  Lab 12/01/20 1424 12/01/20 1541 12/02/20 0252 12/03/20 0440  WBC 6.0  --  4.7 7.4  NEUTROABS 5.0  --  4.0 6.4  HGB 9.0* 10.9* 10.4* 12.1  HCT 28.3* 32.0* 33.3* 36.9  MCV 106.4*  --  108.8* 102.5*  PLT 161  --  162 135*   Cardiac Enzymes: No results for input(s): CKTOTAL, CKMB,  CKMBINDEX, TROPONINI in the last 168 hours. BNP (last 3 results) No results for input(s): PROBNP in the last 8760 hours. CBG: Recent Labs  Lab 12/02/20 0156 12/02/20 0856 12/02/20 1229 12/02/20 1705  GLUCAP 136* 109* 111* 126*   D-Dimer: Recent Labs    12/02/20 0252 12/03/20 0440  DDIMER 7.89* 3.56*   Hgb A1c: No results for input(s): HGBA1C in the last 72 hours. Lipid Profile: Recent Labs    12/01/20 1424  TRIG 131   Thyroid function studies: No results for input(s): TSH, T4TOTAL, T3FREE, THYROIDAB in the last 72 hours.  Invalid input(s): FREET3 Anemia work up: Recent Labs    12/02/20 0252 12/03/20 0440  FERRITIN 268 361*   Sepsis Labs: Recent Labs  Lab 12/01/20 1424 12/01/20 1633 12/02/20 0252 12/03/20 0440  PROCALCITON 0.23  --   --   --   WBC 6.0  --  4.7 7.4  LATICACIDVEN  --  1.3  --   --    Microbiology Recent Results (from the past 240 hour(s))  Blood Culture (routine x 2)     Status: None (Preliminary result)   Collection Time: 12/01/20  4:33 PM   Specimen: BLOOD  Result Value Ref Range Status   Specimen Description   Final    BLOOD RIGHT ANTECUBITAL Performed at [facility], [place] [address] [place], [place] [address]    Special Requests   Final    BOTTLES DRAWN AEROBIC AND ANAEROBIC Blood Culture adequate volume Performed at [place] [address], [place], [place] [address]    Culture   Final    NO GROWTH 2 DAYS Performed at [place] [facility] [address], [place], [place] [address]    Report Status PENDING  Incomplete  Blood Culture (routine x 2)  Status: None (Preliminary result)   Collection Time: 12/01/20  4:33 PM   Specimen: BLOOD  Result Value Ref Range Status   Specimen Description   Final    BLOOD BLOOD RIGHT FOREARM Performed at [facility] [address], [place], [place] [address]    Special Requests   Final    BOTTLES DRAWN AEROBIC AND ANAEROBIC  Blood Culture adequate volume Performed at [facility] [address] [place], [place] [address]    Culture   Final    NO GROWTH 2 DAYS Performed at [facility] [address] [place], [place] [address]    Report Status PENDING  Incomplete  MRSA PCR Screening     Status: None   Collection Time: 12/02/20  1:32 AM   Specimen: Nasopharyngeal  Result Value Ref Range Status   MRSA by PCR NEGATIVE NEGATIVE Final    Comment:        The GeneXpert MRSA Assay (FDA approved for NASAL specimens only), is one component of a comprehensive MRSA colonization surveillance program. It is not intended to diagnose MRSA infection nor to guide or monitor treatment for MRSA infections. Performed at [facility], [place] [address], [place], [place] [address]      Medications:   . albuterol  2 puff Inhalation TID  . amiodarone  200 mg Oral Daily  . vitamin C  500 mg Oral Daily  . atorvastatin  20 mg Oral QHS  . Chlorhexidine Gluconate Cloth  6 each Topical Daily  . digoxin  0.125 mg Oral Daily  . fentaNYL  1 patch Transdermal Q72H  . mouth rinse  15 mL Mouth Rinse BID  . melatonin  3 mg Oral QHS  . mesalamine  1,500 mg Oral QHS  . methylPREDNISolone (SOLU-MEDROL) injection  40 mg Intravenous Q12H  . Rivaroxaban  15 mg Oral Q supper  . zinc sulfate  220 mg Oral Daily   Continuous Infusions: . diltiazem (CARDIZEM) infusion Stopped (12/03/20 0811)  . remdesivir 100 mg in NS 100 mL Stopped (12/03/20 1047)      LOS: 3 days   Charlynne Cousins  [facility]  12/04/2020, 6:59 AM

## 2020-12-04 NOTE — Progress Notes (Signed)
Pt refused lab draw 2x this AM.

## 2020-12-05 MED ORDER — CHLORHEXIDINE GLUCONATE CLOTH 2 % EX PADS: 6.0000 | MEDICATED_PAD | Freq: Every day | CUTANEOUS | Status: DC

## 2020-12-05 MED ORDER — MORPHINE SULFATE (CONCENTRATE) 10 MG/0.5ML PO SOLN: 10.0000 mg | ORAL | Status: DC | PRN

## 2020-12-05 NOTE — Progress Notes (Addendum)
[facility] PROGRESS NOTE    Progress Note  Michele Green  RDE:[id number] DOB: 1941-01-19 DOA: 12/01/2020 PCP: [name], MD     Brief Narrative:   Michele Green is an 80 y.o. female atrial fibrillation on anticoagulation, ulcerative colitis vertebral column of pressure infection was fully vaccinated presents from assisted living facility for 3 days of not feeling well tested positive, presentation was found to be in A. fib with RVR and hypoxia required 3 L of oxygen to keep saturations greater than 90%.  Chest x-ray showed bilateral patchy infiltrates. He was started on IV remdesivir and steroids.  Assessment/Plan:   Acute respiratory failure with hypoxia secondarily to Pneumonia due to COVID-19 virus: Now this morning she is requiring 5 L of oxygen to keep saturations greater than 94%. She has not gotten her IV remdesivir, she is currently on oral steroids as she is refusing IV medication and labs. PICC line was ordered and risk and benefits were explained and she also refused a PICC line. Assess decision-making capacity based on the patient's ability to: - Reason she does not want to be pinched and she does not want to be in pain. - Communicate she is able to communicate the reasons why she is refusing her labs and IVs. - Understand the proposed treatment and that without it she might not survive. - Grasp the consequences of accepting or declining the suggested treatment with IV remdesivir PICC line and lab draws. She is at high risk of aspiration and developing acute confusional state. She has had poor oral intake eating less than 25% of her food over the last 3 days. Were moving towards hospice care, have consulted the social worker to see if we could place her today to skilled with hospice. Transfer to [place].  A. fib with RVR: She was started on amiodarone IV load, her LV infiltrated and she refused, we were not able to start her IV line. She is currently on  metoprolol and oral amiodarone load her heart rate continues to be in RVR She is refusing her labs. She refused lines ans even a PICC line I have informed the son of this.  She relates she does not want to continue to do those anymore. Continue Xarelto.  History of chronic diastolic heart failure: She is refusing treatment.  History of ulcerative colitis: Continue mesalamine patient is on Humira as an outpatient.  History of breast cancer: Follow-up with oncology as an outpatient.  History of vertebral compression fracture and T8 status post kyphosis plasty in previous admission: Continue pain management.  Essential hypertension She is refusing treatment.  Pressure injury of skin stage I present on admission: RN Pressure Injury Documentation: Pressure Injury 12/02/20 Anus Medial Stage 1 -  Intact skin with non-blanchable redness of a localized area usually over a bony prominence. (Active)  12/02/20 0030  Location: Anus  Location Orientation: Medial  Staging: Stage 1 -  Intact skin with non-blanchable redness of a localized area usually over a bony prominence.  Wound Description (Comments):   Present on Admission: Yes    DVT prophylaxis: Xarelto Family Communication:none Status is: Inpatient  Remains inpatient appropriate because:Hemodynamically unstable   Dispo: The patient is from: ALF              Anticipated d/c is to: SNF              Anticipated d/c date is: > 3 days  Patient currently is not medically stable to d/c.  She has refused all treatment, we will move towards hospice care.  Code Status:     Code Status Orders  (From admission, onward)         Start     Ordered   12/01/20 1729  Do not attempt resuscitation (DNR)  Continuous       Question Answer Comment  In the event of cardiac or respiratory ARREST Do not call a "code blue"   In the event of cardiac or respiratory ARREST Do not perform Intubation, CPR, defibrillation or ACLS   In the  event of cardiac or respiratory ARREST Use medication by any route, position, wound care, and other measures to relive pain and suffering. May use oxygen, suction and manual treatment of airway obstruction as needed for comfort.      12/01/20 1728        Code Status History    Date Active Date Inactive Code Status Order ID Comments User Context   12/01/2020 1728 12/01/2020 1728 Full Code [id number]  Shelly Coss, MD ED   09/12/2020 1727 09/20/2020 0201 DNR [id number]  Jonnie Finner, DO ED   08/22/2020 1608 08/23/2020 1619 DNR [id number]  Rosezella Rumpf, NP Inpatient   08/17/2020 0354 08/22/2020 1608 Full Code [id number]  Kayleen Memos, DO Inpatient   08/10/2018 1428 08/17/2018 1741 Full Code [id number]  Rexene Alberts, MD Inpatient   06/20/2018 1312 06/20/2018 2022 Full Code [id number]  [name], MD Inpatient   08/23/2017 1333 08/24/2017 1731 Full Code [id number]  Traci Sermon, PA-C Inpatient   Advance Care Planning Activity        IV Access:    Peripheral IV   Procedures and diagnostic studies:   [place] EKG SITE RITE  Result Date: 12/04/2020 If Site Rite image not attached, placement could not be confirmed due to current cardiac rhythm.    Medical Consultants:    None.  Anti-Infectives:   none  Subjective:    Michele Green relates she wants to go back to her apartment. Objective:    Vitals:   12/05/20 0100 12/05/20 0200 12/05/20 0300 12/05/20 0400  BP: (!) 139/40 (!) 134/30 (!) 134/44 (!) 141/46  Pulse: (!) 125 (!) 126 (!) 125 (!) 126  Resp: 19 19 (!) 21 (!) 22  Temp:      TempSrc:      SpO2: 90% 93% 91% 91%  Weight:      Height:       SpO2: 91 % O2 Flow Rate (L/min): 5 L/min   Intake/Output Summary (Last 24 hours) at 12/05/2020 0705 Last data filed at 12/04/2020 0955 Gross per 24 hour  Intake 120 ml  Output --  Net 120 ml   Filed Weights   12/01/20 1419 12/02/20 0200  Weight: 58.1 kg 56.7 kg    Exam: General exam: In no acute  distress, refused treatment. Respiratory system: Good air movement and clear to auscultation. Cardiovascular system: S1 & S2 heard, RRR. No JVD. Gastrointestinal system: Abdomen is nondistended, soft and nontender.  Extremities: No pedal edema. Skin: No rashes, lesions or ulcers Psychiatry: Judgement and insight appear normal. Mood & affect appropriate.  Data Reviewed:    Labs: Basic Metabolic Panel: Recent Labs  Lab 12/01/20 1424 12/01/20 1541 12/02/20 0252 12/03/20 0440  NA 145 147* 140 139  K 2.7* 2.8* 4.2 3.7  CL 108 109 111 105  CO2 23  --  17* 20*  GLUCOSE  91 86 138* 158*  BUN 19 17 20  33*  CREATININE 1.06* 0.90 0.89 1.04*  CALCIUM 8.1*  --  7.7* 7.8*  MG  --   --  2.0 1.9  PHOS  --   --  3.4 4.1   GFR Estimated Creatinine Clearance: 36.6 mL/min (A) (by C-G formula based on SCr of 1.04 mg/dL (H)). Liver Function Tests: Recent Labs  Lab 12/01/20 1424 12/02/20 0252 12/03/20 0440  AST 27 36 28  ALT 22 23 23   ALKPHOS 45 43 44  BILITOT 1.9* 1.5* 1.2  PROT 6.0* 5.7* 5.5*  ALBUMIN 2.4* 2.3* 2.4*   No results for input(s): LIPASE, AMYLASE in the last 168 hours. No results for input(s): AMMONIA in the last 168 hours. Coagulation profile No results for input(s): INR, PROTIME in the last 168 hours. COVID-19 Labs  Recent Labs    12/03/20 0440  DDIMER 3.56*  FERRITIN 361*  CRP 6.6*    Lab Results  Component Value Date   SARSCOV2NAA NEGATIVE 09/19/2020   [place] NEGATIVE 09/12/2020   SARSCOV2NAA NEGATIVE 08/22/2020   [place] NEGATIVE 08/16/2020    CBC: Recent Labs  Lab 12/01/20 1424 12/01/20 1541 12/02/20 0252 12/03/20 0440  WBC 6.0  --  4.7 7.4  NEUTROABS 5.0  --  4.0 6.4  HGB 9.0* 10.9* 10.4* 12.1  HCT 28.3* 32.0* 33.3* 36.9  MCV 106.4*  --  108.8* 102.5*  PLT 161  --  162 135*   Cardiac Enzymes: No results for input(s): CKTOTAL, CKMB, CKMBINDEX, TROPONINI in the last 168 hours. BNP (last 3 results) No results for input(s): PROBNP  in the last 8760 hours. CBG: Recent Labs  Lab 12/02/20 0156 12/02/20 0856 12/02/20 1229 12/02/20 1705  GLUCAP 136* 109* 111* 126*   D-Dimer: Recent Labs    12/03/20 0440  DDIMER 3.56*   Hgb A1c: No results for input(s): HGBA1C in the last 72 hours. Lipid Profile: No results for input(s): CHOL, HDL, LDLCALC, TRIG, CHOLHDL, LDLDIRECT in the last 72 hours. Thyroid function studies: No results for input(s): TSH, T4TOTAL, T3FREE, THYROIDAB in the last 72 hours.  Invalid input(s): FREET3 Anemia work up: Recent Labs    12/03/20 0440  FERRITIN 361*   Sepsis Labs: Recent Labs  Lab 12/01/20 1424 12/01/20 1633 12/02/20 0252 12/03/20 0440  PROCALCITON 0.23  --   --   --   WBC 6.0  --  4.7 7.4  LATICACIDVEN  --  1.3  --   --    Microbiology Recent Results (from the past 240 hour(s))  Blood Culture (routine x 2)     Status: None (Preliminary result)   Collection Time: 12/01/20  4:33 PM   Specimen: BLOOD  Result Value Ref Range Status   Specimen Description   Final    BLOOD RIGHT ANTECUBITAL Performed at [facility], [place] [address] [place], [place] [address]    Special Requests   Final    BOTTLES DRAWN AEROBIC AND ANAEROBIC Blood Culture adequate volume Performed at [facility] [address], [place], [place] [address]    Culture   Final    NO GROWTH 3 DAYS Performed at [facility], [place] [address] [place], [place] [address]    Report Status PENDING  Incomplete  Blood Culture (routine x 2)     Status: None (Preliminary result)   Collection Time: 12/01/20  4:33 PM   Specimen: BLOOD  Result Value Ref Range Status   Specimen Description  Final    BLOOD BLOOD RIGHT FOREARM Performed at [facility] [address] [place], [place] [address]    Special Requests   Final    BOTTLES DRAWN AEROBIC AND ANAEROBIC Blood Culture adequate volume Performed at [facility] [address], [place], [place] [address]    Culture   Final    NO GROWTH 3 DAYS Performed at [facility], [place] [address], [place], [place] [address]    Report Status PENDING  Incomplete  MRSA PCR Screening     Status: None   Collection Time: 12/02/20  1:32 AM   Specimen: Nasopharyngeal  Result Value Ref Range Status   MRSA by PCR NEGATIVE NEGATIVE Final    Comment:        The GeneXpert MRSA Assay (FDA approved for NASAL specimens only), is one component of a comprehensive MRSA colonization surveillance program. It is not intended to diagnose MRSA infection nor to guide or monitor treatment for MRSA infections. Performed at [facility], [place] [address], [place], [place] [address]      Medications:   . albuterol  2 puff Inhalation TID  . amiodarone  400 mg Oral BID  . vitamin C  500 mg Oral Daily  . atorvastatin  20 mg Oral QHS  . Chlorhexidine Gluconate Cloth  6 each Topical Daily  . digoxin  0.125 mg Oral Daily  . fentaNYL  1 patch Transdermal Q72H  . mouth rinse  15 mL Mouth Rinse BID  . melatonin  3 mg Oral QHS  . mesalamine  1,500 mg Oral QHS  . predniSONE  50 mg Oral Q breakfast  . Rivaroxaban  15 mg Oral Q supper  . zinc sulfate  220 mg Oral Daily   Continuous Infusions: . remdesivir 100 mg in NS 100 mL Stopped (12/03/20 1047)  . sodium chloride        LOS: 4 days   Charlynne Cousins  [facility]  12/05/2020, 7:05 AM

## 2020-12-05 NOTE — [place] FL2 (Signed)
[place] MEDICAID FL2 LEVEL OF CARE SCREENING TOOL     IDENTIFICATION  Patient Name: Michele Green Birthdate: 23-Dec-1940 Sex: female Admission Date (Current Location): 12/01/2020  [facility] and [place] Number:  Herbalist and Address:  [facility],  [place], [place]      Provider Number: [id number]  Attending Physician Name and Address:  Charlynne Cousins, MD  Relative Name and Phone Number:       Current Level of Care: Hospital Recommended Level of Care: [place] Prior Approval Number:    Date Approved/Denied:   PASRR Number: [id number]  Discharge Plan: SNF    Current Diagnoses: Patient Active Problem List   Diagnosis Date Noted  . Acute respiratory failure with hypoxia ([place]) 12/03/2020  . Pressure injury of skin 12/02/2020  . Pneumonia due to COVID-19 virus 12/01/2020  . T8 vertebral fracture ([place]) 09/13/2020  . Compression fracture of C-spine ([place]) 09/12/2020  . Compression fracture of thoracic spine, non-traumatic ([place]) 09/12/2020  . Goals of care, counseling/discussion   . Palliative care by specialist   . Spinal fracture of T7 vertebra ([place]) 08/17/2020  . Atrial fibrillation with RVR ([place]) 08/16/2020  . Long term (current) use of anticoagulants 08/28/2018  . Atrial flutter with rapid ventricular response ([place]) 08/14/2018  . 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 S/P Maze operation for atrial fibrillation 08/10/2018  . Chronic diastolic HF (heart failure) ([place]) 06/20/2018  . Osteoporosis 09/29/2017  . Loosening of hardware in spine ([place]) 08/23/2017  . Spinal stenosis of lumbar region 08/11/2016  . Unspecified cord compression ([place]) 08/11/2016  . Unstable burst fracture of third lumbar vertebra ([place]) 08/11/2016  . Genetic testing 03/17/2016  . Family history of breast cancer   . Breast cancer of upper-outer quadrant of left female breast ([place]) 02/02/2016  . On  amiodarone therapy 09/09/2015  . On continuous oral anticoagulation 08/22/2015  . Persistent atrial fibrillation ([place]) 08/22/2015  . Essential hypertension 11/07/2013  . Mitral regurgitation 11/07/2013    Orientation RESPIRATION BLADDER Height & Weight     Self,Time,Situation,Place  O2 Continent Weight: 56.7 kg Height:  5' 4"  (162.6 cm)  BEHAVIORAL SYMPTOMS/MOOD NEUROLOGICAL BOWEL NUTRITION STATUS      Continent Diet  AMBULATORY STATUS COMMUNICATION OF NEEDS Skin   Extensive Assist Verbally Normal                       Personal Care Assistance Level of Assistance  Bathing,Feeding,Dressing Bathing Assistance: Limited assistance Feeding assistance: Limited assistance Dressing Assistance: Limited assistance     Functional Limitations Info  Sight,Hearing,Speech Sight Info: Adequate Hearing Info: Adequate Speech Info: Adequate    SPECIAL CARE FACTORS FREQUENCY  PT (By licensed PT),OT (By licensed OT)     PT Frequency: 5 x weekly OT Frequency: 5 x weekly            Contractures Contractures Info: Not present    Additional Factors Info  Code Status Code Status Info: DNR             Current Medications (12/05/2020):  This is the current hospital active medication list Current Facility-Administered Medications  Medication Dose Route Frequency Provider Last Rate Last Admin  . acetaminophen (TYLENOL) tablet 650 mg  650 mg Oral Q6H PRN Adhikari, Amrit, MD      . albuterol (VENTOLIN HFA) 108 (90 Base) MCG/ACT inhaler 2 puff  2 puff Inhalation TID Aileen Fass,  Tammi Klippel, MD      . amiodarone (PACERONE) tablet 400 mg  400 mg Oral BID Charlynne Cousins, MD   400 mg at 12/04/20 2004  . digoxin (LANOXIN) tablet 0.125 mg  0.125 mg Oral Daily Shelly Coss, MD   0.125 mg at 12/04/20 0914  . guaiFENesin-dextromethorphan (ROBITUSSIN DM) 100-10 MG/5ML syrup 10 mL  10 mL Oral Q4H PRN Shelly Coss, MD      . MEDLINE mouth rinse  15 mL Mouth Rinse BID Shelly Coss, MD    15 mL at 12/04/20 2051  . melatonin tablet 3 mg  3 mg Oral QHS Charlynne Cousins, MD   3 mg at 12/04/20 2007  . mesalamine (PENTASA) CR capsule 1,500 mg  1,500 mg Oral QHS Shelly Coss, MD   1,500 mg at 12/04/20 2005  . ondansetron (ZOFRAN) tablet 4 mg  4 mg Oral Q6H PRN Shelly Coss, MD       Or  . ondansetron (ZOFRAN) injection 4 mg  4 mg Intravenous Q6H PRN Adhikari, Amrit, MD      . oxyCODONE (Oxy IR/ROXICODONE) immediate release tablet 5 mg  5 mg Oral Q6H PRN Shelly Coss, MD   5 mg at 12/04/20 1252  . polyethylene glycol (MIRALAX / GLYCOLAX) packet 17 g  17 g Oral Daily PRN Shelly Coss, MD      . predniSONE (DELTASONE) tablet 50 mg  50 mg Oral Q breakfast Charlynne Cousins, MD   50 mg at 12/05/20 0813  . QUEtiapine (SEROQUEL) tablet 25 mg  25 mg Oral QHS PRN Charlynne Cousins, MD      . Rivaroxaban Alveda Reasons) tablet 15 mg  15 mg Oral Q supper Shelly Coss, MD   15 mg at 12/04/20 1708  . sodium chloride 0.9 % bolus 1,000 mL  1,000 mL Intravenous Once Charlynne Cousins, MD         Discharge Medications: Please see discharge summary for a list of discharge medications.  Relevant Imaging Results:  Relevant Lab Results:   Additional Information    Leeroy Cha, RN

## 2020-12-05 NOTE — Progress Notes (Signed)
Palliative Care Brief Note  Discussed with Dr. Venetia Constable.  Patient and son are in agreement with plan for comfort care moving forward.    As goals are clear and no symptom management needs at this time, will hold on formal consult.  Please call or reconsult if we can be of assistance in the care of Michele Green moving forward.  Micheline Rough, MD [place] Palliative [facility] Team [phone]  NO CHARGE NOTE

## 2020-12-05 NOTE — TOC Progression Note (Signed)
Transition of Care [facility]) - Progression Note    Patient Details  Name: Michele Green MRN: [id number] Date of Birth: [birth year]  Transition of Care [facility] CM/SW Contact  Leeroy Cha, RN Phone Number: 12/05/2020, 8:19 AM  Clinical Narrative:    passar RXUMZY:[id number] A EEG:[phone] fl2 faxed out to area snf's/ Will start authorization through [employer]  Expected Discharge Plan: [place] Barriers to Discharge: Continued Medical Work up  Expected Discharge Plan and Services Expected Discharge Plan: [place]   Discharge Planning Services: CM Consult   Living arrangements for the past 2 months: Single Family Home                                       Social Determinants of Health (SDOH) Interventions    Readmission Risk Interventions No flowsheet data found.

## 2020-12-05 NOTE — Progress Notes (Signed)
I spoke to the patient about the risk and benefits of refusing treatment IV and lab's and she understands that if her heart continues at this rate it is going to fail.  And she will probably pass away in the hospital. Despite this she still relates she does not want any IV or labs drawn. Her son and patient has decided to move towards comfort care. I will go ahead and DC all labs's, continue medications for heart rate transferred her to a MedSurg floor. And uses medications to keep her comfortable. I have spoken to the son and he would like to speak to her.

## 2020-12-05 NOTE — Progress Notes (Signed)
Pt refused AM labs

## 2020-12-06 LAB — CULTURE, BLOOD (ROUTINE X 2)
Culture: NO GROWTH
Culture: NO GROWTH
Special Requests: ADEQUATE
Special Requests: ADEQUATE

## 2020-12-06 NOTE — TOC Progression Note (Signed)
Transition of Care [facility]) - Progression Note    Patient Details  Name: ASENCION GUISINGER MRN: [id number] Date of Birth: 1941/10/14  Transition of Care [facility]) CM/SW Contact  Lennart Pall, LCSW Phone Number: 12/06/2020, 3:56 PM  Clinical Narrative:    Received TOC order to assist with referrals for residential hospice.  Have reviewed with pt's son who reports pt and family are agreed on this plan.  Son prefers [facility], early years/pre).  I have explained that [facility] is not currently accepting COVID+ patients but we could refer to [facility] who can accept + to either their [employer] or [place] locations - son agreed.  Have spoken with Tye [place] with [facility] who will open case and reach out to son.  They do not currently have any available beds but she will alert when an opening occurs. (will update daily).     Expected Discharge Plan: [place] Barriers to Discharge: Continued Medical Work up  Expected Discharge Plan and Services Expected Discharge Plan: [place]   Discharge Planning Services: CM Consult   Living arrangements for the past 2 months: Single Family Home                                       Social Determinants of Health (SDOH) Interventions    Readmission Risk Interventions No flowsheet data found.

## 2020-12-06 NOTE — Progress Notes (Signed)
[facility] PROGRESS NOTE    Progress Note  Michele Green  WUX:[id number] DOB: [birth year] DOA: 12/01/2020 PCP: [name], MD     Brief Narrative:   Michele Green is an 80 y.o. female atrial fibrillation on anticoagulation, ulcerative colitis vertebral column of pressure infection was fully vaccinated presents from assisted living facility for 3 days of not feeling well tested positive, presentation was found to be in A. fib with RVR and hypoxia required 3 L of oxygen to keep saturations greater than 90%.  Chest x-ray showed bilateral patchy infiltrates. He was started on IV remdesivir and steroids.  Assessment/Plan:   Acute respiratory failure with hypoxia secondarily to Pneumonia due to COVID-19 virus: She started on 5 L oxygen to keep saturation greater than 94%. She refused third and fourth dose of IV remdesivir.  She does not want any more steroids. She related she does not any further treatment or lab draws. These were stopped it was discussed with the family were moving towards comfort care awaiting transfer to [facility].  She will need to go to a residential hospice facility. Will contact TOC to try to get her to a hospice facility she is eating less than 30% of her food once a day the other meals she is not touching them. She has the capacity to make medical decisions.  A. fib with RVR: Metoprolol, amiodarone digoxin and Xarelto were discontinued.  As the patient relates she does not want to continue to do this anymore.  She wants to be made comfortable and be left alone. This was discussed with the son and she agreed.  We will try to get her to a residential hospice facility.  History of chronic diastolic heart failure: She is refusing treatment.  History of ulcerative colitis: Continue mesalamine patient is on Humira as an outpatient.  History of breast cancer: Follow-up with oncology as an outpatient.  History of vertebral compression fracture and T8  status post kyphosis plasty in previous admission: Continue pain management.  Essential hypertension She is refusing treatment.  Pressure injury of skin stage I present on admission: RN Pressure Injury Documentation: Pressure Injury 12/02/20 Anus Medial Stage 1 -  Intact skin with non-blanchable redness of a localized area usually over a bony prominence. (Active)  12/02/20 0030  Location: Anus  Location Orientation: Medial  Staging: Stage 1 -  Intact skin with non-blanchable redness of a localized area usually over a bony prominence.  Wound Description (Comments):   Present on Admission: Yes    DVT prophylaxis: Xarelto Family Communication:none Status is: Inpatient  Remains inpatient appropriate because:Hemodynamically unstable   Dispo: The patient is from: ALF              Anticipated d/c is to: SNF              Anticipated d/c date is: > 3 days              Patient currently is not medically stable to d/c.  She has refused all treatment, we will move towards hospice care.  Code Status:     Code Status Orders  (From admission, onward)         Start     Ordered   12/01/20 1729  Do not attempt resuscitation (DNR)  Continuous       Question Answer Comment  In the event of cardiac or respiratory ARREST Do not call a "code blue"   In the event of cardiac or respiratory ARREST Do  not perform Intubation, CPR, defibrillation or ACLS   In the event of cardiac or respiratory ARREST Use medication by any route, position, wound care, and other measures to relive pain and suffering. May use oxygen, suction and manual treatment of airway obstruction as needed for comfort.      12/01/20 1728        Code Status History    Date Active Date Inactive Code Status Order ID Comments User Context   12/01/2020 1728 12/01/2020 1728 Full Code [id number]  Shelly Coss, MD ED   09/12/2020 1727 09/20/2020 0201 DNR [id number]  Jonnie Finner, DO ED   08/22/2020 1608 08/23/2020 1619 DNR [id number]   Rosezella Rumpf, NP Inpatient   08/17/2020 0354 08/22/2020 1608 Full Code [id number]  Kayleen Memos, DO Inpatient   08/10/2018 1428 08/17/2018 1741 Full Code [id number]  Rexene Alberts, MD Inpatient   06/20/2018 1312 06/20/2018 2022 Full Code [id number]  [name], MD Inpatient   08/23/2017 1333 08/24/2017 1731 Full Code [id number]  Traci Sermon, PA-C Inpatient   Advance Care Planning Activity        IV Access:    Peripheral IV   Procedures and diagnostic studies:   No results found.   Medical Consultants:    None.  Anti-Infectives:   none  Subjective:    Michele Green she has no new complaints this morning she related she is agreeable to go to a residential hospice facility. Objective:    Vitals:   12/05/20 1200 12/05/20 1300 12/05/20 1400 12/05/20 1600  BP: (!) 153/38 (!) 147/52 (!) 150/53 (!) 128/44  Pulse: (!) 125 (!) 124 (!) 124 (!) 122  Resp: 20 20 18  (!) 21  Temp:      TempSrc:      SpO2: 96% 94% 92% 94%  Weight:      Height:       SpO2: 94 % O2 Flow Rate (L/min): 5 L/min  No intake or output data in the 24 hours ending 12/06/20 0742 Filed Weights   12/01/20 1419 12/02/20 0200  Weight: 58.1 kg 56.7 kg    Exam: General exam: In no acute distress. Respiratory system: Good air movement and clear to auscultation. Cardiovascular system: S1 & S2 heard, RRR. No JVD. Gastrointestinal system: Abdomen is nondistended, soft and nontender.  Extremities: No pedal edema. Skin: No rashes, lesions or ulcers Psychiatry: Judgement and insight appear normal. Mood & affect appropriate.  Data Reviewed:    Labs: Basic Metabolic Panel: Recent Labs  Lab 12/01/20 1424 12/01/20 1541 12/02/20 0252 12/03/20 0440  NA 145 147* 140 139  K 2.7* 2.8* 4.2 3.7  CL 108 109 111 105  CO2 23  --  17* 20*  GLUCOSE 91 86 138* 158*  BUN 19 17 20  33*  CREATININE 1.06* 0.90 0.89 1.04*  CALCIUM 8.1*  --  7.7* 7.8*  MG  --   --  2.0 1.9  PHOS  --   --  3.4  4.1   GFR Estimated Creatinine Clearance: 36.6 mL/min (A) (by C-G formula based on SCr of 1.04 mg/dL (H)). Liver Function Tests: Recent Labs  Lab 12/01/20 1424 12/02/20 0252 12/03/20 0440  AST 27 36 28  ALT 22 23 23   ALKPHOS 45 43 44  BILITOT 1.9* 1.5* 1.2  PROT 6.0* 5.7* 5.5*  ALBUMIN 2.4* 2.3* 2.4*   No results for input(s): LIPASE, AMYLASE in the last 168 hours. No results for input(s): AMMONIA in the last 168 hours. Coagulation  profile No results for input(s): INR, PROTIME in the last 168 hours. COVID-19 Labs  No results for input(s): DDIMER, FERRITIN, LDH, CRP in the last 72 hours.  Lab Results  Component Value Date   SARSCOV2NAA NEGATIVE 09/19/2020   [place] NEGATIVE 09/12/2020   [place] NEGATIVE 08/22/2020   [place] NEGATIVE 08/16/2020    CBC: Recent Labs  Lab 12/01/20 1424 12/01/20 1541 12/02/20 0252 12/03/20 0440  WBC 6.0  --  4.7 7.4  NEUTROABS 5.0  --  4.0 6.4  HGB 9.0* 10.9* 10.4* 12.1  HCT 28.3* 32.0* 33.3* 36.9  MCV 106.4*  --  108.8* 102.5*  PLT 161  --  162 135*   Cardiac Enzymes: No results for input(s): CKTOTAL, CKMB, CKMBINDEX, TROPONINI in the last 168 hours. BNP (last 3 results) No results for input(s): PROBNP in the last 8760 hours. CBG: Recent Labs  Lab 12/02/20 0156 12/02/20 0856 12/02/20 1229 12/02/20 1705  GLUCAP 136* 109* 111* 126*   D-Dimer: No results for input(s): DDIMER in the last 72 hours. Hgb A1c: No results for input(s): HGBA1C in the last 72 hours. Lipid Profile: No results for input(s): CHOL, HDL, LDLCALC, TRIG, CHOLHDL, LDLDIRECT in the last 72 hours. Thyroid function studies: No results for input(s): TSH, T4TOTAL, T3FREE, THYROIDAB in the last 72 hours.  Invalid input(s): FREET3 Anemia work up: No results for input(s): VITAMINB12, FOLATE, FERRITIN, TIBC, IRON, RETICCTPCT in the last 72 hours. Sepsis Labs: Recent Labs  Lab 12/01/20 1424 12/01/20 1633 12/02/20 0252 12/03/20 0440   PROCALCITON 0.23  --   --   --   WBC 6.0  --  4.7 7.4  LATICACIDVEN  --  1.3  --   --    Microbiology Recent Results (from the past 240 hour(s))  Blood Culture (routine x 2)     Status: None (Preliminary result)   Collection Time: 12/01/20  4:33 PM   Specimen: BLOOD  Result Value Ref Range Status   Specimen Description   Final    BLOOD RIGHT ANTECUBITAL Performed at [facility], [place] [address] [place], [place] [address]    Special Requests   Final    BOTTLES DRAWN AEROBIC AND ANAEROBIC Blood Culture adequate volume Performed at [facility] [address], [place], [place] [address]    Culture   Final    NO GROWTH 4 DAYS Performed at [facility], [place] [address] [place], [place] [address]    Report Status PENDING  Incomplete  Blood Culture (routine x 2)     Status: None (Preliminary result)   Collection Time: 12/01/20  4:33 PM   Specimen: BLOOD  Result Value Ref Range Status   Specimen Description   Final    BLOOD BLOOD RIGHT FOREARM Performed at [facility] [address] [place], [place] [address]    Special Requests   Final    BOTTLES DRAWN AEROBIC AND ANAEROBIC Blood Culture adequate volume Performed at [facility] [address], [place], [place] [address]    Culture   Final    NO GROWTH 4 DAYS Performed at [facility], [place] [address], [place],  [address]    Report Status PENDING  Incomplete  MRSA PCR Screening     Status: None   Collection Time: 12/02/20  1:32 AM   Specimen: Nasopharyngeal  Result Value Ref Range Status   MRSA by PCR NEGATIVE NEGATIVE Final    Comment:  The GeneXpert MRSA Assay (FDA approved for NASAL specimens only), is one component of a comprehensive MRSA colonization surveillance program. It is not intended to diagnose MRSA infection nor to guide or monitor treatment for MRSA infections. Performed at [facility], [place] [address], [place], [place] [address]      Medications:    Continuous Infusions:     LOS: 5 days   Charlynne Cousins  [facility]  12/06/2020, 7:42 AM

## 2020-12-07 ENCOUNTER — Inpatient Hospital Stay: Payer: Self-pay

## 2020-12-07 LAB — CBC WITH DIFFERENTIAL/PLATELET
Abs Immature Granulocytes: 0.32 10*3/uL — ABNORMAL HIGH (ref 0.00–0.07)
Basophils Absolute: 0 10*3/uL (ref 0.0–0.1)
Basophils Relative: 0 %
Eosinophils Absolute: 0 10*3/uL (ref 0.0–0.5)
Eosinophils Relative: 0 %
HCT: 29.7 % — ABNORMAL LOW (ref 36.0–46.0)
Hemoglobin: 9.5 g/dL — ABNORMAL LOW (ref 12.0–15.0)
Immature Granulocytes: 5 %
Lymphocytes Relative: 10 %
Lymphs Abs: 0.6 10*3/uL — ABNORMAL LOW (ref 0.7–4.0)
MCH: 33.6 pg (ref 26.0–34.0)
MCHC: 32 g/dL (ref 30.0–36.0)
MCV: 104.9 fL — ABNORMAL HIGH (ref 80.0–100.0)
Monocytes Absolute: 0.5 10*3/uL (ref 0.1–1.0)
Monocytes Relative: 7 %
Neutro Abs: 4.9 10*3/uL (ref 1.7–7.7)
Neutrophils Relative %: 78 %
Platelets: 121 10*3/uL — ABNORMAL LOW (ref 150–400)
RBC: 2.83 MIL/uL — ABNORMAL LOW (ref 3.87–5.11)
RDW: 15.4 % (ref 11.5–15.5)
WBC: 6.3 10*3/uL (ref 4.0–10.5)
nRBC: 1.3 % — ABNORMAL HIGH (ref 0.0–0.2)

## 2020-12-07 LAB — BASIC METABOLIC PANEL
Anion gap: 3 — ABNORMAL LOW (ref 5–15)
BUN: 16 mg/dL (ref 8–23)
CO2: 28 mmol/L (ref 22–32)
Calcium: 8.1 mg/dL — ABNORMAL LOW (ref 8.9–10.3)
Chloride: 109 mmol/L (ref 98–111)
Creatinine, Ser: 0.78 mg/dL (ref 0.44–1.00)
GFR, Estimated: >60 mL/min (ref >60)
Glucose, Bld: 84 mg/dL (ref 70–99)
Potassium: 3 mmol/L — ABNORMAL LOW (ref 3.5–5.1)
Sodium: 140 mmol/L (ref 135–145)

## 2020-12-07 MED ORDER — DILTIAZEM HCL 60 MG PO TABS
60.0000 mg | ORAL_TABLET | Freq: Four times a day (QID) | ORAL | Status: DC
  Administered 2020-12-07 – 2020-12-08 (×3): 60 mg via ORAL
  Filled 2020-12-07 (×4): qty 1

## 2020-12-07 MED ORDER — ATORVASTATIN CALCIUM 20 MG PO TABS
20.0000 mg | ORAL_TABLET | Freq: Every day | ORAL | Status: DC
Start: 2020-12-07 — End: 2020-12-12
  Administered 2020-12-07 – 2020-12-10 (×4): 20 mg via ORAL
  Filled 2020-12-07 (×4): qty 1

## 2020-12-07 MED ORDER — METOPROLOL TARTRATE 50 MG PO TABS
100.0000 mg | ORAL_TABLET | Freq: Two times a day (BID) | ORAL | Status: DC
  Administered 2020-12-07 – 2020-12-09 (×3): 100 mg via ORAL
  Filled 2020-12-07 (×2): qty 4
  Filled 2020-12-07 (×3): qty 2

## 2020-12-07 MED ORDER — FUROSEMIDE 40 MG PO TABS
40.0000 mg | ORAL_TABLET | Freq: Two times a day (BID) | ORAL | Status: DC
  Administered 2020-12-07 – 2020-12-09 (×4): 40 mg via ORAL
  Filled 2020-12-07 (×4): qty 1

## 2020-12-07 MED ORDER — MESALAMINE ER 250 MG PO CPCR
1500.0000 mg | ORAL_CAPSULE | Freq: Every day | ORAL | Status: DC
  Administered 2020-12-07 – 2020-12-10 (×4): 1500 mg via ORAL
  Filled 2020-12-07 (×5): qty 6

## 2020-12-07 MED ORDER — METHYLPREDNISOLONE SODIUM SUCC 40 MG IJ SOLR
40.0000 mg | Freq: Two times a day (BID) | INTRAMUSCULAR | Status: DC
  Filled 2020-12-07: qty 1

## 2020-12-07 MED ORDER — DIGOXIN 125 MCG PO TABS
0.1250 mg | ORAL_TABLET | Freq: Every day | ORAL | Status: DC
  Administered 2020-12-07 – 2020-12-11 (×5): 0.125 mg via ORAL
  Filled 2020-12-07 (×6): qty 1

## 2020-12-07 MED ORDER — POTASSIUM CHLORIDE CRYS ER 20 MEQ PO TBCR
40.0000 meq | EXTENDED_RELEASE_TABLET | Freq: Two times a day (BID) | ORAL | Status: AC
  Administered 2020-12-07 (×2): 40 meq via ORAL
  Filled 2020-12-07 (×2): qty 2

## 2020-12-07 MED ORDER — FENTANYL 12 MCG/HR TD PT72
1.0000 | MEDICATED_PATCH | TRANSDERMAL | Status: DC
  Administered 2020-12-07 – 2020-12-10 (×2): 1 via TRANSDERMAL
  Filled 2020-12-07 (×2): qty 1

## 2020-12-07 MED ORDER — RIVAROXABAN 15 MG PO TABS
15.0000 mg | ORAL_TABLET | Freq: Every day | ORAL | Status: DC
Start: 2020-12-07 — End: 2020-12-12
  Administered 2020-12-07 – 2020-12-11 (×5): 15 mg via ORAL
  Filled 2020-12-07 (×5): qty 1

## 2020-12-07 MED ORDER — DILTIAZEM HCL ER COATED BEADS 180 MG PO CP24: 180.0000 mg | ORAL_CAPSULE | Freq: Every day | ORAL | Status: DC

## 2020-12-07 MED ORDER — MORPHINE SULFATE (CONCENTRATE) 10 MG/0.5ML PO SOLN: 10.0000 mg | ORAL | Status: DC | PRN

## 2020-12-07 MED ORDER — DILTIAZEM HCL 30 MG PO TABS: 30.0000 mg | ORAL_TABLET | Freq: Four times a day (QID) | ORAL | Status: DC

## 2020-12-07 MED ORDER — TAMOXIFEN CITRATE 10 MG PO TABS
20.0000 mg | ORAL_TABLET | Freq: Every day | ORAL | Status: DC
Start: 2020-12-07 — End: 2020-12-12
  Administered 2020-12-07 – 2020-12-11 (×5): 20 mg via ORAL
  Filled 2020-12-07 (×5): qty 2

## 2020-12-07 MED ORDER — DILTIAZEM HCL 60 MG PO TABS
60.0000 mg | ORAL_TABLET | Freq: Four times a day (QID) | ORAL | Status: DC
  Administered 2020-12-07: 60 mg via ORAL
  Filled 2020-12-07 (×2): qty 1

## 2020-12-07 NOTE — Progress Notes (Signed)
Consent obtained from pt to place PICC line. A&Ox3.  Left arm restricted due to mastectomy/breast cancer.  Right arm with kerlix wraps upper and lower. Upper arm unwrapped, hematoma color and size improving but remains quite evident, mostly edematous posterior aspect and dependant around elbow region.  Skin integrity poor in upper arm region, thin and fragile. Tourniquet applied to axilla region for less than one minute to assess and noted skin breakthrough weeping posterior aspect of distal upper arm.  Pt states she has been concerned about the arm " I told them I was going to lose this arm if they did not do something about it" also endorsing tenderness with even light touch.  Brachial vein present proximal to the previous IV site which infiltrated amiodarone.  basilic vein measures with 9% catheter occupancy for the PICC.  No cephalic or basilic vein appreciated due to size obviously too small.  Spoke with Dr Aileen Fass re assessment of skin and vessels.  Recommendations not to place any type of intravenous access in RUE due to same skin and tissue assessment continues to fingertips.  Recommendations to refer PICC placement to IR for central access only for patient to receive meds and labwork. Nurse and Pt son also notified of assessment and change in plan.

## 2020-12-07 NOTE — Plan of Care (Signed)
  Problem: Elimination: Goal: Will not experience complications related to urinary retention Outcome: Progressing   Problem: Safety: Goal: Ability to remain free from injury will improve Outcome: Progressing

## 2020-12-07 NOTE — Progress Notes (Signed)
[facility] PROGRESS NOTE    Progress Note  Michele Green  VEL:[id number] DOB: 03-Oct-1941 DOA: 12/01/2020 PCP: [name], MD     Brief Narrative:   Michele Green is an 80 y.o. female atrial fibrillation on anticoagulation, ulcerative colitis vertebral column of pressure infection was fully vaccinated presents from assisted living facility for 3 days of not feeling well tested positive, presentation was found to be in A. fib with RVR and hypoxia required 3 L of oxygen to keep saturations greater than 90%.  Chest x-ray showed bilateral patchy infiltrates. He was started on IV remdesivir and steroids.  Assessment/Plan:   Acute respiratory failure with hypoxia secondarily to Pneumonia due to COVID-19 virus: She is now starting greater 94% on 4 L of oxygen. She is now has changed her mind and has agreed to further lab draws, IV line and a PICC line. I have explained to her with this and means and she is in agreement to continue with her treatment she would like to continue to be a DNR/DNI I will not start her back on her IV remdesivir as her oxygen saturations are improving. Started back on low-dose steroid to complete total 10 days. Check a CBC and a basic metabolic panel.  A. fib with RVR: We will start her back on her metoprolol, digoxin and diltiazem. She is decided that she wants to be treated. Resume her Xarelto.  History of chronic diastolic heart failure: Resume back her medications.  History of ulcerative colitis: Continue mesalamine patient is on Humira as an outpatient.  History of breast cancer: Follow-up with oncology as an outpatient.  History of vertebral compression fracture and T8 status post kyphosis plasty in previous admission: Continue pain management.  Essential hypertension She is refusing treatment.  Pressure injury of skin stage I present on admission: RN Pressure Injury Documentation: Pressure Injury 12/02/20 Anus Medial Stage 1 -   Intact skin with non-blanchable redness of a localized area usually over a bony prominence. (Active)  12/02/20 0030  Location: Anus  Location Orientation: Medial  Staging: Stage 1 -  Intact skin with non-blanchable redness of a localized area usually over a bony prominence.  Wound Description (Comments):   Present on Admission: Yes    DVT prophylaxis: Xarelto Family Communication:none Status is: Inpatient  Remains inpatient appropriate because:Hemodynamically unstable   Dispo: The patient is from: ALF              Anticipated d/c is to: SNF              Anticipated d/c date is: > 3 days              Patient currently is not medically stable to d/c.  She will need a physical therapy evaluation and skilled nursing facility placement.  Code Status:     Code Status Orders  (From admission, onward)         Start     Ordered   12/01/20 1729  Do not attempt resuscitation (DNR)  Continuous       Question Answer Comment  In the event of cardiac or respiratory ARREST Do not call a "code blue"   In the event of cardiac or respiratory ARREST Do not perform Intubation, CPR, defibrillation or ACLS   In the event of cardiac or respiratory ARREST Use medication by any route, position, wound care, and other measures to relive pain and suffering. May use oxygen, suction and manual treatment of airway obstruction as needed for  comfort.      12/01/20 1728        Code Status History    Date Active Date Inactive Code Status Order ID Comments User Context   12/01/2020 1728 12/01/2020 1728 Full Code [id number]  Shelly Coss, MD ED   09/12/2020 1727 09/20/2020 0201 DNR [id number]  Jonnie Finner, DO ED   08/22/2020 1608 08/23/2020 1619 DNR [id number]  Rosezella Rumpf, NP Inpatient   08/17/2020 0354 08/22/2020 1608 Full Code [id number]  Kayleen Memos, DO Inpatient   08/10/2018 1428 08/17/2018 1741 Full Code [id number]  Rexene Alberts, MD Inpatient   06/20/2018 1312 06/20/2018 2022 Full Code  [id number]  [name], MD Inpatient   08/23/2017 1333 08/24/2017 1731 Full Code [id number]  Traci Sermon, PA-C Inpatient   Advance Care Planning Activity        IV Access:    Peripheral IV   Procedures and diagnostic studies:   No results found.   Medical Consultants:    None.  Anti-Infectives:   none  Subjective:    Michele Green relate this morning that she wants to continue to be alive.  She has 2 grandchildren which she would like to see grow up and she would like treatment for her medical condition. Objective:    Vitals:   12/05/20 1600 12/06/20 1814 12/06/20 2016 12/07/20 0516  BP: (!) 128/44 (!) 123/35 (!) 115/52 (!) 120/49  Pulse: (!) 122 (!) 139 88 67  Resp: (!) 21 (!) 24 (!) 22 17  Temp:  97.6 F (36.4 C) 97.7 F (36.5 C) 97.8 F (36.6 C)  TempSrc:  Oral Oral Oral  SpO2: 94% (!) 87% 94% 93%  Weight:      Height:       SpO2: 93 % O2 Flow Rate (L/min): 4 L/min   Intake/Output Summary (Last 24 hours) at 12/07/2020 0802 Last data filed at 12/06/2020 1700 Gross per 24 hour  Intake 960 ml  Output --  Net 960 ml   Filed Weights   12/01/20 1419 12/02/20 0200  Weight: 58.1 kg 56.7 kg    Exam: General exam: In no acute distress. Respiratory system: Good air movement and clear to auscultation. Cardiovascular system: S1 & S2 heard, RRR. No JVD. Gastrointestinal system: Abdomen is nondistended, soft and nontender.  Extremities: No pedal edema. Skin: No rashes, lesions or ulcers Psychiatry: Judgement and insight appear normal. Mood & affect appropriate.  Data Reviewed:    Labs: Basic Metabolic Panel: Recent Labs  Lab 12/01/20 1424 12/01/20 1541 12/02/20 0252 12/03/20 0440  NA 145 147* 140 139  K 2.7* 2.8* 4.2 3.7  CL 108 109 111 105  CO2 23  --  17* 20*  GLUCOSE 91 86 138* 158*  BUN 19 17 20  33*  CREATININE 1.06* 0.90 0.89 1.04*  CALCIUM 8.1*  --  7.7* 7.8*  MG  --   --  2.0 1.9  PHOS  --   --  3.4 4.1    GFR Estimated Creatinine Clearance: 36.6 mL/min (A) (by C-G formula based on SCr of 1.04 mg/dL (H)). Liver Function Tests: Recent Labs  Lab 12/01/20 1424 12/02/20 0252 12/03/20 0440  AST 27 36 28  ALT 22 23 23   ALKPHOS 45 43 44  BILITOT 1.9* 1.5* 1.2  PROT 6.0* 5.7* 5.5*  ALBUMIN 2.4* 2.3* 2.4*   No results for input(s): LIPASE, AMYLASE in the last 168 hours. No results for input(s): AMMONIA in the last 168 hours. Coagulation profile No  results for input(s): INR, PROTIME in the last 168 hours. COVID-19 Labs  No results for input(s): DDIMER, FERRITIN, LDH, CRP in the last 72 hours.  Lab Results  Component Value Date   SARSCOV2NAA NEGATIVE 09/19/2020   [place] NEGATIVE 09/12/2020   [place] NEGATIVE 08/22/2020   [place] NEGATIVE 08/16/2020    CBC: Recent Labs  Lab 12/01/20 1424 12/01/20 1541 12/02/20 0252 12/03/20 0440  WBC 6.0  --  4.7 7.4  NEUTROABS 5.0  --  4.0 6.4  HGB 9.0* 10.9* 10.4* 12.1  HCT 28.3* 32.0* 33.3* 36.9  MCV 106.4*  --  108.8* 102.5*  PLT 161  --  162 135*   Cardiac Enzymes: No results for input(s): CKTOTAL, CKMB, CKMBINDEX, TROPONINI in the last 168 hours. BNP (last 3 results) No results for input(s): PROBNP in the last 8760 hours. CBG: Recent Labs  Lab 12/02/20 0156 12/02/20 0856 12/02/20 1229 12/02/20 1705  GLUCAP 136* 109* 111* 126*   D-Dimer: No results for input(s): DDIMER in the last 72 hours. Hgb A1c: No results for input(s): HGBA1C in the last 72 hours. Lipid Profile: No results for input(s): CHOL, HDL, LDLCALC, TRIG, CHOLHDL, LDLDIRECT in the last 72 hours. Thyroid function studies: No results for input(s): TSH, T4TOTAL, T3FREE, THYROIDAB in the last 72 hours.  Invalid input(s): FREET3 Anemia work up: No results for input(s): VITAMINB12, FOLATE, FERRITIN, TIBC, IRON, RETICCTPCT in the last 72 hours. Sepsis Labs: Recent Labs  Lab 12/01/20 1424 12/01/20 1633 12/02/20 0252 12/03/20 0440  PROCALCITON  0.23  --   --   --   WBC 6.0  --  4.7 7.4  LATICACIDVEN  --  1.3  --   --    Microbiology Recent Results (from the past 240 hour(s))  Blood Culture (routine x 2)     Status: None   Collection Time: 12/01/20  4:33 PM   Specimen: BLOOD  Result Value Ref Range Status   Specimen Description   Final    BLOOD RIGHT ANTECUBITAL Performed at [facility], [place] [address], [place], [place] [address]    Special Requests   Final    BOTTLES DRAWN AEROBIC AND ANAEROBIC Blood Culture adequate volume Performed at [facility] [address] [place], [place] [address]    Culture   Final    NO GROWTH 5 DAYS Performed at [facility], [place] [address], [place], [place] [address]    Report Status 12/06/2020 FINAL  Final  Blood Culture (routine x 2)     Status: None   Collection Time: 12/01/20  4:33 PM   Specimen: BLOOD  Result Value Ref Range Status   Specimen Description   Final    BLOOD BLOOD RIGHT FOREARM Performed at [facility] [address], [place], [place] [address]    Special Requests   Final    BOTTLES DRAWN AEROBIC AND ANAEROBIC Blood Culture adequate volume Performed at [facility] [address] [place], [place] [address]    Culture   Final    NO GROWTH 5 DAYS Performed at [facility], [place] [address], [place], [place] [address]    Report Status 12/06/2020 FINAL  Final  MRSA PCR Screening     Status: None   Collection Time: 12/02/20  1:32 AM   Specimen: Nasopharyngeal  Result Value Ref Range Status   MRSA by PCR NEGATIVE NEGATIVE Final    Comment:        The GeneXpert  MRSA Assay (FDA approved for NASAL specimens only), is one component of a comprehensive MRSA colonization surveillance program. It is not intended to diagnose MRSA infection nor to guide or monitor treatment for MRSA infections. Performed at [facility], [place] [address], [place], [place]  [address]      Medications:    Continuous Infusions:     LOS: 6 days   Charlynne Cousins  [facility]  12/07/2020, 8:02 AM

## 2020-12-08 LAB — CBC WITH DIFFERENTIAL/PLATELET
Abs Immature Granulocytes: 0.46 10*3/uL — ABNORMAL HIGH (ref 0.00–0.07)
Basophils Absolute: 0 10*3/uL (ref 0.0–0.1)
Basophils Relative: 1 %
Eosinophils Absolute: 0 10*3/uL (ref 0.0–0.5)
Eosinophils Relative: 1 %
HCT: 30.5 % — ABNORMAL LOW (ref 36.0–46.0)
Hemoglobin: 10.4 g/dL — ABNORMAL LOW (ref 12.0–15.0)
Immature Granulocytes: 7 %
Lymphocytes Relative: 12 %
Lymphs Abs: 0.8 10*3/uL (ref 0.7–4.0)
MCH: 34.3 pg — ABNORMAL HIGH (ref 26.0–34.0)
MCHC: 34.1 g/dL (ref 30.0–36.0)
MCV: 100.7 fL — ABNORMAL HIGH (ref 80.0–100.0)
Monocytes Absolute: 0.5 10*3/uL (ref 0.1–1.0)
Monocytes Relative: 8 %
Neutro Abs: 4.8 10*3/uL (ref 1.7–7.7)
Neutrophils Relative %: 71 %
Platelets: 122 10*3/uL — ABNORMAL LOW (ref 150–400)
RBC: 3.03 MIL/uL — ABNORMAL LOW (ref 3.87–5.11)
RDW: 15.2 % (ref 11.5–15.5)
WBC: 6.7 10*3/uL (ref 4.0–10.5)
nRBC: 1.1 % — ABNORMAL HIGH (ref 0.0–0.2)

## 2020-12-08 LAB — BASIC METABOLIC PANEL
Anion gap: 13 (ref 5–15)
BUN: 20 mg/dL (ref 8–23)
CO2: 20 mmol/L — ABNORMAL LOW (ref 22–32)
Calcium: 8.4 mg/dL — ABNORMAL LOW (ref 8.9–10.3)
Chloride: 112 mmol/L — ABNORMAL HIGH (ref 98–111)
Creatinine, Ser: 0.94 mg/dL (ref 0.44–1.00)
GFR, Estimated: >60 mL/min (ref >60)
Glucose, Bld: 86 mg/dL (ref 70–99)
Potassium: 5.4 mmol/L — ABNORMAL HIGH (ref 3.5–5.1)
Sodium: 145 mmol/L (ref 135–145)

## 2020-12-08 MED ORDER — DILTIAZEM HCL 30 MG PO TABS
30.0000 mg | ORAL_TABLET | Freq: Four times a day (QID) | ORAL | Status: DC
  Administered 2020-12-09 – 2020-12-11 (×12): 30 mg via ORAL
  Filled 2020-12-08 (×14): qty 1

## 2020-12-08 NOTE — Evaluation (Signed)
Physical Therapy Evaluation Patient Details Name: Michele Green MRN: [id number] DOB: January 15, 1941 Today's Date: 12/08/2020   History of Present Illness  Michele Green is an 80 y.o. female atrial fibrillation on anticoagulation, ulcerative colitis vertebral column of pressure infection was fully vaccinated presents from assisted living facility for 3 days of not feeling well tested positive, presentation was found to be in A. fib with RVR and hypoxia required 3 L of oxygen to keep saturations greater than 90%.  Chest x-ray showed bilateral patchy infiltrates.After 3 days of treatment the patient decided she did not want any more lab draws, she did not any further IVs or medications. She wanted to be made comfortable.  Telemetry was discontinued she had removal of her IVs and she was just being treated with oral morphine and Ativan.  On 12/07/2020 she change her mind and decided she wanted everything done, she wanted an IV line placed and lab draws.  Regimen for her A. fib was started, as her saturations had improved, her SARS-CoV-2 treatment was stopped.  Clinical Impression  The patient is very frail. Patient did attempt to sit on bed edge very briefly. Patient's SPO2 on 2 l 955. Pt admitted with above diagnosis.   Pt currently with functional limitations due to the deficits listed below (see PT Problem List). Pt will benefit from skilled PT to increase their independence and safety with mobility to allow discharge to the venue listed below.       Follow Up Recommendations SNF    Equipment Recommendations  None recommended by PT    Recommendations for Other Services       Precautions / Restrictions Precautions Precautions: Fall Precaution Comments: fragile skin      Mobility  Bed Mobility Overal bed mobility: Needs Assistance Bed Mobility: Sidelying to Sit;Sit to Sidelying   Sidelying to sit: Max assist     Sit to sidelying: Max assist General bed mobility comments: with  encouragement, assisted patient to sitting on bed edge for ~ 3 seconds    Transfers                 General transfer comment: NT, patient reports wanting to rest.  Ambulation/Gait                Stairs            Wheelchair Mobility    Modified Rankin (Stroke Patients Only)       Balance Overall balance assessment: Needs assistance   Sitting balance-Leahy Scale: Poor Sitting balance - Comments: did not sit upright for long enough to assess,.                                     Pertinent Vitals/Pain Pain Assessment: Faces Faces Pain Scale: Hurts even more Pain Location: everywhere Pain Descriptors / Indicators: Discomfort;Moaning Pain Intervention(s): Monitored during session;Repositioned    Home Living Family/patient expects to be discharged to:: Skilled nursing facility                 Additional Comments: resided in [place] prior to adm.    Prior Function                 Hand Dominance        Extremity/Trunk Assessment   Upper Extremity Assessment Upper Extremity Assessment: Generalized weakness    Lower Extremity Assessment Lower Extremity Assessment: Generalized weakness  Communication   Communication: No difficulties  Cognition Arousal/Alertness: Awake/alert Behavior During Therapy: Flat affect Overall Cognitive Status: Difficult to assess                                 General Comments: patient did not want to answer many questions      General Comments      Exercises     Assessment/Plan    PT Assessment Patient needs continued PT services  PT Problem List Decreased strength;Decreased mobility;Decreased activity tolerance;Decreased knowledge of use of DME       PT Treatment Interventions Therapeutic activities;Therapeutic exercise;Functional mobility training    PT Goals (Current goals can be found in the Care Plan section)  Acute Rehab PT Goals Patient  Stated Goal: to sleep PT Goal Formulation: With patient Time For Goal Achievement: 12/22/20 Potential to Achieve Goals: Poor    Frequency Min 2X/week   Barriers to discharge        Co-evaluation               AM-PAC PT "6 Clicks" Mobility  Outcome Measure Help needed turning from your back to your side while in a flat bed without using bedrails?: Total Help needed moving from lying on your back to sitting on the side of a flat bed without using bedrails?: Total Help needed moving to and from a bed to a chair (including a wheelchair)?: Total Help needed standing up from a chair using your arms (e.g., wheelchair or bedside chair)?: Total Help needed to walk in hospital room?: Total Help needed climbing 3-5 steps with a railing? : Total 6 Click Score: 6    End of Session   Activity Tolerance: Patient limited by fatigue Patient left: in bed;with call bell/phone within reach Nurse Communication: Mobility status PT Visit Diagnosis: Unsteadiness on feet (R26.81);Difficulty in walking, not elsewhere classified (R26.2)    Time: 3361-2244 PT Time Calculation (min) (ACUTE ONLY): 13 min   Charges:   PT Evaluation $PT Eval Low Complexity: [place] PT Acute Rehabilitation Services Pager [phone] Office [phone]   Claretha Cooper 12/08/2020, 4:45 PM

## 2020-12-08 NOTE — Progress Notes (Signed)
[facility] PROGRESS NOTE    Progress Note  TATIONA STECH  PPG:[id number] DOB: Dec 14, 1940 DOA: 12/01/2020 PCP: [name], MD     Brief Narrative:   Michele Green is an 80 y.o. female atrial fibrillation on anticoagulation, ulcerative colitis vertebral column of pressure infection was fully vaccinated presents from assisted living facility for 3 days of not feeling well tested positive, presentation was found to be in A. fib with RVR and hypoxia required 3 L of oxygen to keep saturations greater than 90%.  Chest x-ray showed bilateral patchy infiltrates. He was started on IV remdesivir and steroids.  After 3 days of treatment the patient decided she did not want any more lab draws, she did not any further IVs or medications. She wanted to be made comfortable.  Telemetry was discontinued she had removal of her IVs and she was just being treated with oral morphine and Ativan.  On 12/07/2020 she change her mind and decided she wanted everything done, she wanted an IV line placed and lab draws.  Regimen for her A. fib was started, as her saturations had improved, her SARS-CoV-2 treatment was stopped.  Assessment/Plan:   Acute respiratory failure with hypoxia secondarily to Pneumonia due to COVID-19 virus: She is satting greater than 99% on 2 L I believe we could wean her to room air. At this point in time will not place a PICC line. Reconsult physical therapy and will get TOC involved for skilled nursing facility placement. She will need to go to skilled nursing facility with oxygen.  A. fib with RVR: Started back on metoprolol digoxin and diltiazem her heart rate is in the 40s will hold diltiazem resumed at a lower dose tomorrow morning.   Continue her Xarelto.  History of chronic diastolic heart failure: Resume back her medications.  History of ulcerative colitis: Continue mesalamine patient is on Humira as an outpatient.  History of breast cancer: Follow-up with  oncology as an outpatient.  History of vertebral compression fracture and T8 status post kyphosis plasty in previous admission: Continue pain management.  Essential hypertension Well-controlled continue current regimen  Pressure injury of skin stage I present on admission: RN Pressure Injury Documentation: Pressure Injury 12/02/20 Anus Medial Stage 1 -  Intact skin with non-blanchable redness of a localized area usually over a bony prominence. (Active)  12/02/20 0030  Location: Anus  Location Orientation: Medial  Staging: Stage 1 -  Intact skin with non-blanchable redness of a localized area usually over a bony prominence.  Wound Description (Comments):   Present on Admission: Yes    DVT prophylaxis: Xarelto Family Communication:none Status is: Inpatient  Remains inpatient appropriate because:Hemodynamically unstable   Dispo: The patient is from: ALF              Anticipated d/c is to: SNF              Anticipated d/c date is: > 3 days              Patient currently is not medically stable to d/c.  She will need a physical therapy evaluation and skilled nursing facility placement.  Code Status:     Code Status Orders  (From admission, onward)         Start     Ordered   12/01/20 1729  Do not attempt resuscitation (DNR)  Continuous       Question Answer Comment  In the event of cardiac or respiratory ARREST Do not call a "code  blue"   In the event of cardiac or respiratory ARREST Do not perform Intubation, CPR, defibrillation or ACLS   In the event of cardiac or respiratory ARREST Use medication by any route, position, wound care, and other measures to relive pain and suffering. May use oxygen, suction and manual treatment of airway obstruction as needed for comfort.      12/01/20 1728        Code Status History    Date Active Date Inactive Code Status Order ID Comments User Context   12/01/2020 1728 12/01/2020 1728 Full Code [id number]  Shelly Coss, MD ED    09/12/2020 1727 09/20/2020 0201 DNR [id number]  Jonnie Finner, DO ED   08/22/2020 1608 08/23/2020 1619 DNR [id number]  Rosezella Rumpf, NP Inpatient   08/17/2020 0354 08/22/2020 1608 Full Code [id number]  Kayleen Memos, DO Inpatient   08/10/2018 1428 08/17/2018 1741 Full Code [id number]  Rexene Alberts, MD Inpatient   06/20/2018 1312 06/20/2018 2022 Full Code [id number]  [name], MD Inpatient   08/23/2017 1333 08/24/2017 1731 Full Code [id number]  Traci Sermon, PA-C Inpatient   Advance Care Planning Activity        IV Access:    Peripheral IV   Procedures and diagnostic studies:   [place] EKG SITE RITE  Result Date: 12/07/2020 If Site Rite image not attached, placement could not be confirmed due to current cardiac rhythm.    Medical Consultants:    None.  Anti-Infectives:   none  Subjective:    Michele Green relates her breathing is significantly better. Objective:    Vitals:   12/07/20 2013 12/07/20 2036 12/08/20 0218 12/08/20 0327  BP: (!) 101/34  (!) 110/44 (!) 114/37  Pulse: (!) 53   (!) 44  Resp: 20   16  Temp: 98.1 F (36.7 C)   98.2 F (36.8 C)  TempSrc: Oral   Oral  SpO2: (!) 85% 90%  98%  Weight:      Height:       SpO2: 98 % O2 Flow Rate (L/min): 4 L/min   Intake/Output Summary (Last 24 hours) at 12/08/2020 0936 Last data filed at 12/07/2020 1538 Gross per 24 hour  Intake 240 ml  Output --  Net 240 ml   Filed Weights   12/01/20 1419 12/02/20 0200  Weight: 58.1 kg 56.7 kg    Exam: General exam: In no acute distress. Respiratory system: Good air movement and clear to auscultation. Cardiovascular system: S1 & S2 heard, RRR. No JVD. Gastrointestinal system: Abdomen is nondistended, soft and nontender.  Extremities: No pedal edema. Skin: No rashes, lesions or ulcers Psychiatry: Judgement and insight appear normal. Mood & affect appropriate.  Data Reviewed:    Labs: Basic Metabolic Panel: Recent Labs  Lab 12/01/20 1424  12/01/20 1541 12/02/20 0252 12/03/20 0440 12/07/20 1123 12/08/20 0309  NA 145 147* 140 139 140 145  K 2.7* 2.8* 4.2 3.7 3.0* 5.4*  CL 108 109 111 105 109 112*  CO2 23  --  17* 20* 28 20*  GLUCOSE 91 86 138* 158* 84 86  BUN 19 17 20  33* 16 20  CREATININE 1.06* 0.90 0.89 1.04* 0.78 0.94  CALCIUM 8.1*  --  7.7* 7.8* 8.1* 8.4*  MG  --   --  2.0 1.9  --   --   PHOS  --   --  3.4 4.1  --   --    GFR Estimated Creatinine Clearance: 40.5 mL/min (by C-G  formula based on SCr of 0.94 mg/dL). Liver Function Tests: Recent Labs  Lab 12/01/20 1424 12/02/20 0252 12/03/20 0440  AST 27 36 28  ALT 22 23 23   ALKPHOS 45 43 44  BILITOT 1.9* 1.5* 1.2  PROT 6.0* 5.7* 5.5*  ALBUMIN 2.4* 2.3* 2.4*   No results for input(s): LIPASE, AMYLASE in the last 168 hours. No results for input(s): AMMONIA in the last 168 hours. Coagulation profile No results for input(s): INR, PROTIME in the last 168 hours. COVID-19 Labs  No results for input(s): DDIMER, FERRITIN, LDH, CRP in the last 72 hours.  Lab Results  Component Value Date   SARSCOV2NAA NEGATIVE 09/19/2020   [place] NEGATIVE 09/12/2020   [place] NEGATIVE 08/22/2020   [place] NEGATIVE 08/16/2020    CBC: Recent Labs  Lab 12/01/20 1424 12/01/20 1541 12/02/20 0252 12/03/20 0440 12/07/20 1123 12/08/20 0309  WBC 6.0  --  4.7 7.4 6.3 6.7  NEUTROABS 5.0  --  4.0 6.4 4.9 4.8  HGB 9.0* 10.9* 10.4* 12.1 9.5* 10.4*  HCT 28.3* 32.0* 33.3* 36.9 29.7* 30.5*  MCV 106.4*  --  108.8* 102.5* 104.9* 100.7*  PLT 161  --  162 135* 121* 122*   Cardiac Enzymes: No results for input(s): CKTOTAL, CKMB, CKMBINDEX, TROPONINI in the last 168 hours. BNP (last 3 results) No results for input(s): PROBNP in the last 8760 hours. CBG: Recent Labs  Lab 12/02/20 0156 12/02/20 0856 12/02/20 1229 12/02/20 1705  GLUCAP 136* 109* 111* 126*   D-Dimer: No results for input(s): DDIMER in the last 72 hours. Hgb A1c: No results for input(s): HGBA1C in  the last 72 hours. Lipid Profile: No results for input(s): CHOL, HDL, LDLCALC, TRIG, CHOLHDL, LDLDIRECT in the last 72 hours. Thyroid function studies: No results for input(s): TSH, T4TOTAL, T3FREE, THYROIDAB in the last 72 hours.  Invalid input(s): FREET3 Anemia work up: No results for input(s): VITAMINB12, FOLATE, FERRITIN, TIBC, IRON, RETICCTPCT in the last 72 hours. Sepsis Labs: Recent Labs  Lab 12/01/20 1424 12/01/20 1633 12/02/20 0252 12/03/20 0440 12/07/20 1123 12/08/20 0309  PROCALCITON 0.23  --   --   --   --   --   WBC 6.0  --  4.7 7.4 6.3 6.7  LATICACIDVEN  --  1.3  --   --   --   --    Microbiology Recent Results (from the past 240 hour(s))  Blood Culture (routine x 2)     Status: None   Collection Time: 12/01/20  4:33 PM   Specimen: BLOOD  Result Value Ref Range Status   Specimen Description   Final    BLOOD RIGHT ANTECUBITAL Performed at [facility], [place] [address] [place], [place] [address]    Special Requests   Final    BOTTLES DRAWN AEROBIC AND ANAEROBIC Blood Culture adequate volume Performed at [facility] [address], [place], [place] [address]    Culture   Final    NO GROWTH 5 DAYS Performed at [facility], [place] [address] [place], [place] [address]    Report Status 12/06/2020 FINAL  Final  Blood Culture (routine x 2)     Status: None   Collection Time: 12/01/20  4:33 PM   Specimen: BLOOD  Result Value Ref Range Status   Specimen Description   Final    BLOOD BLOOD RIGHT FOREARM Performed at [facility] [address] [place], [place] [address]    Special Requests   Final  BOTTLES DRAWN AEROBIC AND ANAEROBIC Blood Culture adequate volume Performed at [facility] [address], [place], [place] [address]    Culture   Final    NO GROWTH 5 DAYS Performed at [facility], [place] [address] [place], [place] [address]    Report Status 12/06/2020  FINAL  Final  MRSA PCR Screening     Status: None   Collection Time: 12/02/20  1:32 AM   Specimen: Nasopharyngeal  Result Value Ref Range Status   MRSA by PCR NEGATIVE NEGATIVE Final    Comment:        The GeneXpert MRSA Assay (FDA approved for NASAL specimens only), is one component of a comprehensive MRSA colonization surveillance program. It is not intended to diagnose MRSA infection nor to guide or monitor treatment for MRSA infections. Performed at [facility], [place] [address] [place],  [address]      Medications:   . atorvastatin  20 mg Oral QHS  . digoxin  0.125 mg Oral Daily  . diltiazem  60 mg Oral Q6H  . fentaNYL  1 patch Transdermal Q72H  . furosemide  40 mg Oral BID  . mesalamine  1,500 mg Oral QHS  . metoprolol tartrate  100 mg Oral BID  . Rivaroxaban  15 mg Oral Q supper  . tamoxifen  20 mg Oral Daily   Continuous Infusions:     LOS: 7 days   Charlynne Cousins  [facility]  12/08/2020, 9:36 AM

## 2020-12-08 NOTE — TOC Progression Note (Signed)
Transition of Care [facility]) - Progression Note    Patient Details  Name: Michele Green MRN: [id number] Date of Birth: [birth year]  Transition of Care [facility]) CM/SW [place], [place] Phone Number: 12/08/2020, 1:21 PM  Clinical Narrative:   Followed up with patient subsequent to hearing from MD that she now wants to go to SNF for short term rehab, and is no longer wanting residential hospice care.  Michele Green confirmed this.  When told list of who is currently taking Covid + patients, she immediately said "[facility]." She gave me permission to call son, Michele Green, who also confirmed change in plans and agreed that [facility] is the facility they want as they are familiar with them from a previous admission.  Bed search initiated.  Heard back from [facility] saying they want her back, will let us know when a bed comes available. TOC will continue to follow during the course of hospitalization.  Addendum:  Warehouse manager number is [phone]     Expected Discharge Plan: Skilled Nursing Facility Barriers to Discharge: Other (comment) ([facility] has accepted, will take her when they have an opening)  Expected Discharge Plan and Services Expected Discharge Plan: [place]   Discharge Planning Services: CM Consult   Living arrangements for the past 2 months: Single Family Home                                       Social Determinants of Health (SDOH) Interventions    Readmission Risk Interventions No flowsheet data found.

## 2020-12-09 LAB — CBC WITH DIFFERENTIAL/PLATELET
Abs Immature Granulocytes: 0.32 10*3/uL — ABNORMAL HIGH (ref 0.00–0.07)
Basophils Absolute: 0 10*3/uL (ref 0.0–0.1)
Basophils Relative: 1 %
Eosinophils Absolute: 0 10*3/uL (ref 0.0–0.5)
Eosinophils Relative: 0 %
HCT: 35.2 % — ABNORMAL LOW (ref 36.0–46.0)
Hemoglobin: 11.2 g/dL — ABNORMAL LOW (ref 12.0–15.0)
Immature Granulocytes: 6 %
Lymphocytes Relative: 10 %
Lymphs Abs: 0.6 10*3/uL — ABNORMAL LOW (ref 0.7–4.0)
MCH: 33.7 pg (ref 26.0–34.0)
MCHC: 31.8 g/dL (ref 30.0–36.0)
MCV: 106 fL — ABNORMAL HIGH (ref 80.0–100.0)
Monocytes Absolute: 0.6 10*3/uL (ref 0.1–1.0)
Monocytes Relative: 10 %
Neutro Abs: 3.9 10*3/uL (ref 1.7–7.7)
Neutrophils Relative %: 73 %
Platelets: 100 10*3/uL — ABNORMAL LOW (ref 150–400)
RBC: 3.32 MIL/uL — ABNORMAL LOW (ref 3.87–5.11)
RDW: 15.1 % (ref 11.5–15.5)
WBC: 5.4 10*3/uL (ref 4.0–10.5)
nRBC: 0.9 % — ABNORMAL HIGH (ref 0.0–0.2)

## 2020-12-09 LAB — BASIC METABOLIC PANEL
Anion gap: 11 (ref 5–15)
BUN: 15 mg/dL (ref 8–23)
CO2: 30 mmol/L (ref 22–32)
Calcium: 8.4 mg/dL — ABNORMAL LOW (ref 8.9–10.3)
Chloride: 98 mmol/L (ref 98–111)
Creatinine, Ser: 0.86 mg/dL (ref 0.44–1.00)
GFR, Estimated: >60 mL/min (ref >60)
Glucose, Bld: 98 mg/dL (ref 70–99)
Potassium: 3.4 mmol/L — ABNORMAL LOW (ref 3.5–5.1)
Sodium: 139 mmol/L (ref 135–145)

## 2020-12-09 MED ORDER — FENTANYL 12 MCG/HR TD PT72: 1.0000 | MEDICATED_PATCH | TRANSDERMAL | 0 refills | Status: DC

## 2020-12-09 MED ORDER — METOPROLOL TARTRATE 50 MG PO TABS
50.0000 mg | ORAL_TABLET | Freq: Two times a day (BID) | ORAL | Status: DC
  Administered 2020-12-09 – 2020-12-10 (×2): 50 mg via ORAL
  Filled 2020-12-09 (×2): qty 1

## 2020-12-09 MED ORDER — OXYCODONE HCL 5 MG PO TABS: 5.0000 mg | ORAL_TABLET | Freq: Four times a day (QID) | ORAL | 0 refills | Status: DC | PRN

## 2020-12-09 MED ORDER — POTASSIUM CHLORIDE CRYS ER 20 MEQ PO TBCR
40.0000 meq | EXTENDED_RELEASE_TABLET | Freq: Two times a day (BID) | ORAL | Status: AC
  Administered 2020-12-09 (×2): 40 meq via ORAL
  Filled 2020-12-09 (×2): qty 2

## 2020-12-09 NOTE — Care Management Important Message (Signed)
Important Message  Patient Details IM Letter placed in Patients door [facility]. Name: Michele Green MRN: [id number] Date of Birth: 1941-03-18   Medicare Important Message Given:  Yes     Kerin Salen 12/09/2020, 2:02 PM

## 2020-12-09 NOTE — Progress Notes (Signed)
Physician Discharge Summary  Michele Green ZOX:[id number] DOB: 11/04/1941 DOA: 12/01/2020  PCP: [name], MD  Admit date: 12/01/2020 Discharge date: 12/09/2020  Admitted From: ALF Disposition:  SNF  Recommendations for Outpatient Follow-up:  1. Follow up with PCP in 1-2 weeks 2. Please obtain BMP/CBC in one week   Home Health:No Equipment/Devices:None  Discharge Condition:Stable CODE STATUS:DNR Diet recommendation: Heart Healthy / Carb Modified / Regular / Dysphagia   Brief/Interim Summary: 80 y.o. female atrial fibrillation on anticoagulation, ulcerative colitis vertebral column of pressure infection was fully vaccinated presents from assisted living facility for 3 days of not feeling well tested positive, presentation was found to be in A. fib with RVR and hypoxia required 3 L of oxygen to keep saturations greater than 90%.  Chest x-ray showed bilateral patchy infiltrates. He was started on IV remdesivir and steroids.  After 3 days of treatment the patient decided she did not want any more lab draws, she did not any further IVs or medications. She wanted to be made comfortable.  Telemetry was discontinued she had removal of her IVs and she was just being treated with oral morphine and Ativan.  On 12/07/2020 she change her mind and decided she wanted everything done, she wanted an IV line placed and lab draws.  Regimen for her A. fib was started, as her saturations had improved,.  Discharge Diagnoses:  Principal Problem:   Pneumonia due to COVID-19 virus Active Problems:   Essential hypertension   Persistent atrial fibrillation (HCC)   Breast cancer of upper-outer quadrant of left female breast (HCC)   Chronic diastolic HF (heart failure) (HCC)   Atrial fibrillation with RVR (HCC)   Pressure injury of skin   Acute respiratory failure with hypoxia (HCC) Acute respiratory failure with hypoxia secondary to COVID-19 pneumonia: She was started empirically on IV remdesivir and  steroids. After 3 days of treatment she related she did not want anything else done she refused her IV line and labs could not be drawn as she refused, her steroids were changed to oral, her capacity reassessed and she had the medical capacity to make her own medical decisions. After getting 3 days of oral therapy of all her medication (she could not get IV remdesivir as she did not had a peripheral IV) the patient related she wanted to resume therapy because her oxygen requirements were decreasing she went from 8 L to 2 L in 24 hours her remdesivir was not restarted.  She was continue steroids for 2 additional days and she was weaned to room air. Her appetite improved she started to work with physical therapy and she decided she wanted to fight this as she had 2 grandchildren that she wanted to see. Completed her treatment in house and she was discharged in stable condition to skilled nursing facility.  A. fib with RVR: She was continued on her metoprolol and diltiazem she got a bolus of IV amiodarone as her heart rate was hard to control and her RVR, she was continued on Xarelto metoprolol and diltiazem should continue as an outpatient.  History of chronic diastolic heart failure: No changes made to her medication continue current regimen.  History ulcerative colitis: Change made to her medication.  History of breast cancer: Follow-up with oncology as an outpatient.  History of vertebral compression fracture on T8 status post kyphoplasty: Continue current pain regimen.  Essential hypertension: No changes made to her medication.  Discharge Instructions  Discharge Instructions    Diet - low sodium heart  healthy   Complete by: As directed    Increase activity slowly   Complete by: As directed    No wound care   Complete by: As directed      Allergies as of 12/09/2020      Reactions   Shellfish Allergy Itching      Medication List    TAKE these medications   acetaminophen 500  MG tablet Commonly known as: TYLENOL Take 500-1,000 mg by mouth every 6 (six) hours as needed for moderate pain or headache.   alendronate 70 MG tablet Commonly known as: FOSAMAX Take 70 mg by mouth once a week.   allopurinol 100 MG tablet Commonly known as: ZYLOPRIM Take 200 mg by mouth every evening.   atorvastatin 20 MG tablet Commonly known as: LIPITOR TAKE 1 TABLET BY MOUTH EVERY DAY   bisacodyl 10 MG suppository Commonly known as: DULCOLAX Place 1 suppository (10 mg total) rectally daily as needed for moderate constipation.   cyclobenzaprine 5 MG tablet Commonly known as: FLEXERIL Take 1 tablet (5 mg total) by mouth 3 (three) times daily as needed for muscle spasms.   digoxin 0.125 MG tablet Commonly known as: LANOXIN TAKE ONE TABLET BY MOUTH DAILY MONDAY THROUGH FRIDAY. DO NOT TAKE ON SATURDAY OR SUNDAY. What changed: See the new instructions.   diltiazem 180 MG 24 hr capsule Commonly known as: CARDIZEM CD Take 180 mg by mouth daily.   docusate sodium 100 MG capsule Commonly known as: COLACE Take 1 capsule (100 mg total) by mouth 2 (two) times daily.   feeding supplement Liqd Take 237 mLs by mouth 2 (two) times daily between meals.   fentaNYL 12 MCG/HR Commonly known as: DURAGESIC Place 1 patch onto the skin every 3 (three) days. What changed: when to take this   mesalamine 0.375 g 24 hr capsule Commonly known as: APRISO Take 0.375 g by mouth daily.   metoprolol tartrate 100 MG tablet Commonly known as: LOPRESSOR TAKE 1 TABLET BY MOUTH TWICE A DAY   multivitamin with minerals Tabs tablet Take 1 tablet by mouth daily.   oxyCODONE 5 MG immediate release tablet Commonly known as: Oxy IR/ROXICODONE Take 1 tablet (5 mg total) by mouth every 6 (six) hours as needed for severe pain.   potassium chloride SA 20 MEQ tablet Commonly known as: Klor-Con M20 Take 1 tablet (20 mEq total) by mouth daily as needed. Take only when taking a Lasix   tamoxifen 20 MG  tablet Commonly known as: NOLVADEX Take 20 mg by mouth daily.   Xarelto 15 MG Tabs tablet Generic drug: Rivaroxaban TAKE 1 TABLET BY MOUTH  DAILY WITH SUPPER What changed: See the new instructions.            Durable Medical Equipment  (From admission, onward)         Start     Ordered   12/08/20 1009  For home use only DME oxygen  Once       Question Answer Comment  Length of Need 6 Months   Mode or (Route) Nasal cannula   Liters per Minute 2   Frequency Continuous (stationary and portable oxygen unit needed)   Oxygen delivery system Gas      01 /17/22 1009          Allergies  Allergen Reactions  . Shellfish Allergy Itching    Consultations:  None   Procedures/Studies: DG Chest Port 1 View  Result Date: 12/01/2020 CLINICAL DATA:  Shortness of breath EXAM: PORTABLE  CHEST 1 VIEW COMPARISON:  09/12/2020 FINDINGS: Median sternotomy with prosthetic cardiac valve and left atrial appendage clip. Stable heart size. Atherosclerotic calcification of the aortic knob. Interval development of patchy opacities within the peripheral aspects of the mid to lower lung fields bilaterally, left worse than right. No large pleural fluid collection. No pneumothorax. IMPRESSION: Interval development of patchy opacities within the peripheral aspects of the mid to lower lung fields bilaterally, left worse than right. Findings suggestive of multifocal atypical/viral pneumonia. Electronically Signed   By: Davina Poke D.O.   On: 12/01/2020 16:00   [place] EKG SITE RITE  Result Date: 12/07/2020 If Site Rite image not attached, placement could not be confirmed due to current cardiac rhythm.  [place] EKG SITE RITE  Result Date: 12/04/2020 If Site Rite image not attached, placement could not be confirmed due to current cardiac rhythm.     Subjective: No new complaints  Discharge Exam: Vitals:   12/08/20 0327 12/08/20 1250  BP: (!) 114/37 (!) 106/38  Pulse: (!) 44 (!) 57  Resp: 16 20   Temp: 98.2 F (36.8 C) 97.6 F (36.4 C)  SpO2: 98% 100%   Vitals:   12/07/20 2036 12/08/20 0218 12/08/20 0327 12/08/20 1250  BP:  (!) 110/44 (!) 114/37 (!) 106/38  Pulse:   (!) 44 (!) 57  Resp:   16 20  Temp:   98.2 F (36.8 C) 97.6 F (36.4 C)  TempSrc:   Oral Oral  SpO2: 90%  98% 100%  Weight:      Height:        General: Pt is alert, awake, not in acute distress Cardiovascular: RRR, S1/S2 +, no rubs, no gallops Respiratory: CTA bilaterally, no wheezing, no rhonchi Abdominal: Soft, NT, ND, bowel sounds + Extremities: no edema, no cyanosis    The results of significant diagnostics from this hospitalization (including imaging, microbiology, ancillary and laboratory) are listed below for reference.     Microbiology: Recent Results (from the past 240 hour(s))  Blood Culture (routine x 2)     Status: None   Collection Time: 12/01/20  4:33 PM   Specimen: BLOOD  Result Value Ref Range Status   Specimen Description   Final    BLOOD RIGHT ANTECUBITAL Performed at [facility] [address] [place], [place] [address]    Special Requests   Final    BOTTLES DRAWN AEROBIC AND ANAEROBIC Blood Culture adequate volume Performed at [facility] [address] [place], [place] [address]    Culture   Final    NO GROWTH 5 DAYS Performed at [facility], [place] [address] [place], [place] [address]    Report Status 12/06/2020 FINAL  Final  Blood Culture (routine x 2)     Status: None   Collection Time: 12/01/20  4:33 PM   Specimen: BLOOD  Result Value Ref Range Status   Specimen Description   Final    BLOOD BLOOD RIGHT FOREARM Performed at [facility] [address] [place], [place] [address]    Special Requests   Final    BOTTLES DRAWN AEROBIC AND ANAEROBIC Blood Culture adequate volume Performed at [facility] [address], [place], [place] [address]    Culture   Final    NO  GROWTH 5 DAYS Performed at [facility], [place] [address], [place],  [address]    Report Status 12/06/2020 FINAL  Final  MRSA PCR Screening  Status: None   Collection Time: 12/02/20  1:32 AM   Specimen: Nasopharyngeal  Result Value Ref Range Status   MRSA by PCR NEGATIVE NEGATIVE Final    Comment:        The GeneXpert MRSA Assay (FDA approved for NASAL specimens only), is one component of a comprehensive MRSA colonization surveillance program. It is not intended to diagnose MRSA infection nor to guide or monitor treatment for MRSA infections. Performed at [facility], [place] [address] [place], [place] [address]      Labs: BNP (last 3 results) Recent Labs    05/17/20 1230 08/16/20 1452  BNP 237.4* 604.5*   Basic Metabolic Panel: Recent Labs  Lab 12/03/20 0440 12/07/20 1123 12/08/20 0309 12/09/20 0412  NA 139 140 145 139  K 3.7 3.0* 5.4* 3.4*  CL 105 109 112* 98  CO2 20* 28 20* 30  GLUCOSE 158* 84 86 98  BUN 33* 16 20 15   CREATININE 1.04* 0.78 0.94 0.86  CALCIUM 7.8* 8.1* 8.4* 8.4*  MG 1.9  --   --   --   PHOS 4.1  --   --   --    Liver Function Tests: Recent Labs  Lab 12/03/20 0440  AST 28  ALT 23  ALKPHOS 44  BILITOT 1.2  PROT 5.5*  ALBUMIN 2.4*   No results for input(s): LIPASE, AMYLASE in the last 168 hours. No results for input(s): AMMONIA in the last 168 hours. CBC: Recent Labs  Lab 12/03/20 0440 12/07/20 1123 12/08/20 0309 12/09/20 0412  WBC 7.4 6.3 6.7 5.4  NEUTROABS 6.4 4.9 4.8 3.9  HGB 12.1 9.5* 10.4* 11.2*  HCT 36.9 29.7* 30.5* 35.2*  MCV 102.5* 104.9* 100.7* 106.0*  PLT 135* 121* 122* 100*   Cardiac Enzymes: No results for input(s): CKTOTAL, CKMB, CKMBINDEX, TROPONINI in the last 168 hours. BNP: Invalid input(s): POCBNP CBG: Recent Labs  Lab 12/02/20 1229 12/02/20 1705  GLUCAP 111* 126*   D-Dimer No results for input(s): DDIMER in the last 72 hours. Hgb A1c No results for input(s):  HGBA1C in the last 72 hours. Lipid Profile No results for input(s): CHOL, HDL, LDLCALC, TRIG, CHOLHDL, LDLDIRECT in the last 72 hours. Thyroid function studies No results for input(s): TSH, T4TOTAL, T3FREE, THYROIDAB in the last 72 hours.  Invalid input(s): FREET3 Anemia work up No results for input(s): VITAMINB12, FOLATE, FERRITIN, TIBC, IRON, RETICCTPCT in the last 72 hours. Urinalysis    Component Value Date/Time   COLORURINE YELLOW 08/17/2020 [place] 08/17/2020 0434   LABSPEC 1.011 08/17/2020 0434   PHURINE 5.0 08/17/2020 0434   GLUCOSEU NEGATIVE 08/17/2020 0434   HGBUR NEGATIVE 08/17/2020 0434   BILIRUBINUR NEGATIVE 08/17/2020 [place] 08/17/2020 0434   PROTEINUR NEGATIVE 08/17/2020 0434   NITRITE NEGATIVE 08/17/2020 0434   LEUKOCYTESUR NEGATIVE 08/17/2020 0434   Sepsis Labs Invalid input(s): PROCALCITONIN,  WBC,  LACTICIDVEN Microbiology Recent Results (from the past 240 hour(s))  Blood Culture (routine x 2)     Status: None   Collection Time: 12/01/20  4:33 PM   Specimen: BLOOD  Result Value Ref Range Status   Specimen Description   Final    BLOOD RIGHT ANTECUBITAL Performed at [facility], [place] [address], [place], [place] [address]    Special Requests   Final    BOTTLES DRAWN AEROBIC AND ANAEROBIC Blood Culture adequate volume Performed at [facility] [address] [place],  [address]  Culture   Final    NO GROWTH 5 DAYS Performed at [facility], [place] [address] [place], [place] [address]    Report Status 12/06/2020 FINAL  Final  Blood Culture (routine x 2)     Status: None   Collection Time: 12/01/20  4:33 PM   Specimen: BLOOD  Result Value Ref Range Status   Specimen Description   Final    BLOOD BLOOD RIGHT FOREARM Performed at [facility] [address], [place], [place] [address]    Special Requests   Final    BOTTLES DRAWN AEROBIC AND  ANAEROBIC Blood Culture adequate volume Performed at [facility] [address] [place], [place] [address]    Culture   Final    NO GROWTH 5 DAYS Performed at [facility], [place] [address] [place], [place] [address]    Report Status 12/06/2020 FINAL  Final  MRSA PCR Screening     Status: None   Collection Time: 12/02/20  1:32 AM   Specimen: Nasopharyngeal  Result Value Ref Range Status   MRSA by PCR NEGATIVE NEGATIVE Final    Comment:        The GeneXpert MRSA Assay (FDA approved for NASAL specimens only), is one component of a comprehensive MRSA colonization surveillance program. It is not intended to diagnose MRSA infection nor to guide or monitor treatment for MRSA infections. Performed at [facility], [place] [address], [place], [place] [address]      Time coordinating discharge: Over 30 minutes  SIGNED:   Charlynne Cousins, MD  [facility] 12/09/2020, 9:11 AM Pager   If 7PM-7AM, please contact night-coverage [email or web address] Password [email or web address]

## 2020-12-09 NOTE — Progress Notes (Addendum)
AMION page sent to Dr. Aileen Fass d/t to patient BP 84/48.  I spoke w/ Dr. Aileen Fass and I informed him that patient is laying in bed w/ no symptoms. He stated to continue to monitor patient and to see new orders.

## 2020-12-10 MED ORDER — PREDNISONE 20 MG PO TABS
40.0000 mg | ORAL_TABLET | Freq: Every day | ORAL | Status: DC
  Administered 2020-12-10 – 2020-12-11 (×2): 40 mg via ORAL
  Filled 2020-12-10 (×2): qty 2

## 2020-12-10 MED ORDER — METOPROLOL TARTRATE 25 MG PO TABS
25.0000 mg | ORAL_TABLET | Freq: Two times a day (BID) | ORAL | Status: DC
  Administered 2020-12-10 – 2020-12-11 (×2): 25 mg via ORAL
  Filled 2020-12-10 (×2): qty 1

## 2020-12-10 NOTE — Plan of Care (Signed)
  Problem: Activity: Goal: Risk for activity intolerance will decrease Outcome: Progressing   Problem: Nutrition: Goal: Adequate nutrition will be maintained Outcome: Progressing   Problem: Respiratory: Goal: Will maintain a patent airway Outcome: Progressing

## 2020-12-10 NOTE — Progress Notes (Signed)
Physical Therapy Treatment Patient Details Name: Michele Green MRN: [id number] DOB: 09-03-1941 Today's Date: 12/10/2020    History of Present Illness Michele Green is an 80 y.o. female atrial fibrillation on anticoagulation, ulcerative colitis vertebral column of pressure infection was fully vaccinated presents from assisted living facility for 3 days of not feeling well tested positive, presentation was found to be in A. fib with RVR and hypoxia required 3 L of oxygen to keep saturations greater than 90%.  Chest x-ray showed bilateral patchy infiltrates.After 3 days of treatment the patient decided she did not want any more lab draws, she did not any further IVs or medications. She wanted to be made comfortable.  Telemetry was discontinued she had removal of her IVs and she was just being treated with oral morphine and Ativan.  On 12/07/2020 she change her mind and decided she wanted everything done, she wanted an IV line placed and lab draws.  Regimen for her A. fib was started, as her saturations had improved, her SARS-CoV-2 treatment was stopped.    PT Comments    Pt tolerated increased activity level today, she sat at edge of bed for 3 minutes. Assisted pt with performing BUE/LE strengthening exercises. SaO2 91% on 2L O2 with activity, HR 124 max with activity. Pt expressed frustration at being so weak.   Follow Up Recommendations  SNF     Equipment Recommendations  None recommended by PT    Recommendations for Other Services       Precautions / Restrictions Precautions Precautions: Fall Precaution Comments: fragile skin Restrictions Weight Bearing Restrictions: No    Mobility  Bed Mobility Overal bed mobility: Needs Assistance Bed Mobility: Rolling;Sidelying to Sit;Sit to Supine Rolling: Min assist Sidelying to sit: Mod assist   Sit to supine: Max assist   General bed mobility comments: with encouragement, assisted patient to sitting on bed edge for  3 minutes,  sitting tolerance limited by onset of dizziness, no dynamap in room and pt reported she needed to return to supine so assisted her to bed (unable to obtain sitting BP). SaO2 91% on 2L O2 while sitting, HR 124 max during activity  Transfers                 General transfer comment: NT, patient reports wanting to rest.  Ambulation/Gait                 Stairs             Wheelchair Mobility    Modified Rankin (Stroke Patients Only)       Balance Overall balance assessment: Needs assistance Sitting-balance support: Feet supported;Single extremity supported Sitting balance-Leahy Scale: Poor                                      Cognition Arousal/Alertness: Awake/alert Behavior During Therapy: WFL for tasks assessed/performed Overall Cognitive Status: Within Functional Limits for tasks assessed                                 General Comments: pt frustrated with her situation "I can't do what I need to do bc I'm so weak".      Exercises General Exercises - Upper Extremity Shoulder Flexion: AROM;Both;10 reps;Supine General Exercises - Lower Extremity Ankle Circles/Pumps: AROM;Both;10 reps;Supine Long Arc Quad: AROM;Both;10 reps;Seated Heel Slides: AROM;Both;10 reps;Supine  General Comments General comments (skin integrity, edema, etc.): SaO2 91% on 2L O2 while sitting, HR 124 max during activity      Pertinent Vitals/Pain Pain Assessment: No/denies pain    Home Living                      Prior Function            PT Goals (current goals can now be found in the care plan section) Acute Rehab PT Goals Patient Stated Goal: to get stronger PT Goal Formulation: With patient Time For Goal Achievement: 12/22/20 Potential to Achieve Goals: Fair Progress towards PT goals: Progressing toward goals    Frequency    Min 2X/week      PT Plan Current plan remains appropriate    Co-evaluation               AM-PAC PT "6 Clicks" Mobility   Outcome Measure  Help needed turning from your back to your side while in a flat bed without using bedrails?: Total Help needed moving from lying on your back to sitting on the side of a flat bed without using bedrails?: A Lot Help needed moving to and from a bed to a chair (including a wheelchair)?: Total Help needed standing up from a chair using your arms (e.g., wheelchair or bedside chair)?: Total Help needed to walk in hospital room?: Total Help needed climbing 3-5 steps with a railing? : Total 6 Click Score: 7    End of Session   Activity Tolerance: Patient limited by fatigue Patient left: in bed;with call bell/phone within reach;with bed alarm set Nurse Communication: Mobility status PT Visit Diagnosis: Unsteadiness on feet (R26.81);Difficulty in walking, not elsewhere classified (R26.2)     Time: 0923-3007 PT Time Calculation (min) (ACUTE ONLY): 20 min  Charges:  $Therapeutic Activity: 8-22 mins                    Blondell Reveal Kistler PT 12/10/2020  Acute Rehabilitation Services Pager [phone] Office [phone]

## 2020-12-10 NOTE — Progress Notes (Signed)
PROGRESS NOTE    Michele Green  LYY:[id number] DOB: 1941-09-17 DOA: 12/01/2020 PCP: [name], MD   Brief Narrative: Michele Green is a 80 y.o. female with a history of atrial fibrillation on Xarelto, ulcerative colitis, hypertension. Patient presented secondary to shortness of breath and was found to have evidence of pneumonia secondary to COVID-19 infection.   Assessment & Plan:   Principal Problem:   Pneumonia due to COVID-19 virus Active Problems:   Essential hypertension   Persistent atrial fibrillation (HCC)   Breast cancer of upper-outer quadrant of left female breast (HCC)   Chronic diastolic HF (heart failure) (HCC)   Atrial fibrillation with RVR (HCC)   Pressure injury of skin   Acute respiratory failure with hypoxia (HCC)   Acute respiratory failure with hypoxia Secondary to COVID-19 infection with resultant pneumonia. Patient requiring as much as 5 L of oxygen via [place] and has been weaned to 2 L via  -Wean to room air as able -Keep O2 >85%  COVID-19 pneumonia Patient was treated with Remdesivir IV for three days in addition to steroids for another three days. Baricitinib not started. CRP and d-dimer trended down near admission; no repeat performed. -Restart prednisone 40 mg daily  Atrial fibrillation with RVR Likely related to not being on her home rate control medication. She is also on Xarelto as an outpatient. RVR resolved. -Continue metoprolol, digoxin, diltiazem -Continue Xarelto  Chronic diastolic heart failure Stable.  Primary hypertension Blood pressure somewhat soft in setting of rate control medications. Improved this morning. Metoprolol already decreased to 50 mg BID -Decrease to metoprolol 25 mg BID -Continue diltiazem  History of ulcerative colitis On mesalamine and Humira as an outpatient -Continue mesalamine  History of breast cancer Patient follows with oncology as an outpatient. No acute issues.   DVT prophylaxis:  Xarelto Code Status:   Code Status: DNR Family Communication: None at bedside Disposition Plan: Discharge to SNF when bed is available. Medically stable for discharge.   Consultants:   Palliative care medicine  Procedures:   None  Antimicrobials:  Remdesivir    Subjective: No issues this morning.  Objective: Vitals:   12/09/20 1740 12/10/20 1028 12/10/20 1030 12/10/20 1500  BP: 103/66 (!) 108/54 102/65 (!) 102/34  Pulse: 62 60 61 60  Resp: 20 20 20 16   Temp:  98 F (36.7 C) 98 F (36.7 C) 98.1 F (36.7 C)  TempSrc:  Oral Oral Oral  SpO2:  94% 94% 94%  Weight:      Height:        Intake/Output Summary (Last 24 hours) at 12/10/2020 1520 Last data filed at 12/10/2020 0941 Gross per 24 hour  Intake 354 ml  Output 200 ml  Net 154 ml   Filed Weights   12/01/20 1419 12/02/20 0200  Weight: 58.1 kg 56.7 kg    Examination:  General exam: Appears calm and comfortable Respiratory system: Diminished. Respiratory effort normal. Cardiovascular system: S1 & S2 heard, RRR. No murmurs, rubs, gallops or clicks. Gastrointestinal system: Abdomen is nondistended, soft and nontender. No organomegaly or masses felt. Normal bowel sounds heard. Central nervous system: Alert and oriented. No focal neurological deficits. Musculoskeletal: No edema. No calf tenderness Skin: No cyanosis. No rashes Psychiatry: Judgement and insight appear normal. Mood & affect appropriate.     Data Reviewed: I have personally reviewed following labs and imaging studies  CBC Lab Results  Component Value Date   WBC 5.4 12/09/2020   RBC 3.32 (L) 12/09/2020  HGB 11.2 (L) 12/09/2020   HCT 35.2 (L) 12/09/2020   MCV 106.0 (H) 12/09/2020   MCH 33.7 12/09/2020   PLT 100 (L) 12/09/2020   MCHC 31.8 12/09/2020   RDW 15.1 12/09/2020   LYMPHSABS 0.6 (L) 12/09/2020   MONOABS 0.6 12/09/2020   EOSABS 0.0 12/09/2020   BASOSABS 0.0 52/77/8242     Last metabolic panel Lab Results  Component Value  Date   NA 139 12/09/2020   K 3.4 (L) 12/09/2020   CL 98 12/09/2020   CO2 30 12/09/2020   BUN 15 12/09/2020   CREATININE 0.86 12/09/2020   GLUCOSE 98 12/09/2020   GFRNONAA >60 12/09/2020   GFRAA >60 08/20/2020   CALCIUM 8.4 (L) 12/09/2020   PHOS 4.1 12/03/2020   PROT 5.5 (L) 12/03/2020   ALBUMIN 2.4 (L) 12/03/2020   LABGLOB 2.7 06/14/2018   AGRATIO 1.5 06/14/2018   BILITOT 1.2 12/03/2020   ALKPHOS 44 12/03/2020   AST 28 12/03/2020   ALT 23 12/03/2020   ANIONGAP 11 12/09/2020    CBG (last 3)  No results for input(s): GLUCAP in the last 72 hours.   GFR: Estimated Creatinine Clearance: 44.3 mL/min (by C-G formula based on SCr of 0.86 mg/dL).  Coagulation Profile: No results for input(s): INR, PROTIME in the last 168 hours.  Recent Results (from the past 240 hour(s))  Blood Culture (routine x 2)     Status: None   Collection Time: 12/01/20  4:33 PM   Specimen: BLOOD  Result Value Ref Range Status   Specimen Description   Final    BLOOD RIGHT ANTECUBITAL Performed at [facility] [address], [place], [place] [address]    Special Requests   Final    BOTTLES DRAWN AEROBIC AND ANAEROBIC Blood Culture adequate volume Performed at [facility] [address] [place], [place] [address]    Culture   Final    NO GROWTH 5 DAYS Performed at [facility], [place] [address], [place], [place] [address]    Report Status 12/06/2020 FINAL  Final  Blood Culture (routine x 2)     Status: None   Collection Time: 12/01/20  4:33 PM   Specimen: BLOOD  Result Value Ref Range Status   Specimen Description   Final    BLOOD BLOOD RIGHT FOREARM Performed at [facility] [address] [place], [place] [address]    Special Requests   Final    BOTTLES DRAWN AEROBIC AND ANAEROBIC Blood Culture adequate volume Performed at [facility] [address], [place], [place] [address]    Culture   Final    NO  GROWTH 5 DAYS Performed at [facility], [place] [address] [place], [place] [address]    Report Status 12/06/2020 FINAL  Final  MRSA PCR Screening     Status: None   Collection Time: 12/02/20  1:32 AM   Specimen: Nasopharyngeal  Result Value Ref Range Status   MRSA by PCR NEGATIVE NEGATIVE Final    Comment:        The GeneXpert MRSA Assay (FDA approved for NASAL specimens only), is one component of a comprehensive MRSA colonization surveillance program. It is not intended to diagnose MRSA infection nor to guide or monitor treatment for MRSA infections. Performed at [facility], [place] [address] [place], [place] [address]         Radiology Studies: No results found.      Scheduled  Meds: . atorvastatin  20 mg Oral QHS  . digoxin  0.125 mg Oral Daily  . diltiazem  30 mg Oral Q6H  . fentaNYL  1 patch Transdermal Q72H  . mesalamine  1,500 mg Oral QHS  . metoprolol tartrate  50 mg Oral BID  . Rivaroxaban  15 mg Oral Q supper  . tamoxifen  20 mg Oral Daily   Continuous Infusions:   LOS: 9 days     Cordelia Poche, MD [facility] 12/10/2020, 3:20 PM  If 7PM-7AM, please contact night-coverage [email or web address]

## 2020-12-11 DIAGNOSIS — C50912 Malignant neoplasm of unspecified site of left female breast: Secondary | ICD-10-CM | POA: Diagnosis not present

## 2020-12-11 DIAGNOSIS — M6281 Muscle weakness (generalized): Secondary | ICD-10-CM | POA: Diagnosis not present

## 2020-12-11 DIAGNOSIS — R0602 Shortness of breath: Secondary | ICD-10-CM | POA: Diagnosis not present

## 2020-12-11 DIAGNOSIS — L89616 Pressure-induced deep tissue damage of right heel: Secondary | ICD-10-CM | POA: Diagnosis not present

## 2020-12-11 DIAGNOSIS — E782 Mixed hyperlipidemia: Secondary | ICD-10-CM | POA: Diagnosis not present

## 2020-12-11 DIAGNOSIS — J9601 Acute respiratory failure with hypoxia: Secondary | ICD-10-CM | POA: Diagnosis not present

## 2020-12-11 DIAGNOSIS — K5190 Ulcerative colitis, unspecified, without complications: Secondary | ICD-10-CM | POA: Diagnosis not present

## 2020-12-11 DIAGNOSIS — I509 Heart failure, unspecified: Secondary | ICD-10-CM | POA: Diagnosis not present

## 2020-12-11 DIAGNOSIS — R112 Nausea with vomiting, unspecified: Secondary | ICD-10-CM | POA: Diagnosis not present

## 2020-12-11 DIAGNOSIS — J9691 Respiratory failure, unspecified with hypoxia: Secondary | ICD-10-CM | POA: Diagnosis not present

## 2020-12-11 DIAGNOSIS — M8000XD Age-related osteoporosis with current pathological fracture, unspecified site, subsequent encounter for fracture with routine healing: Secondary | ICD-10-CM | POA: Diagnosis not present

## 2020-12-11 DIAGNOSIS — I4891 Unspecified atrial fibrillation: Secondary | ICD-10-CM | POA: Diagnosis not present

## 2020-12-11 DIAGNOSIS — R300 Dysuria: Secondary | ICD-10-CM | POA: Diagnosis not present

## 2020-12-11 DIAGNOSIS — R5381 Other malaise: Secondary | ICD-10-CM | POA: Diagnosis not present

## 2020-12-11 DIAGNOSIS — R279 Unspecified lack of coordination: Secondary | ICD-10-CM | POA: Diagnosis not present

## 2020-12-11 DIAGNOSIS — J1282 Pneumonia due to coronavirus disease 2019: Secondary | ICD-10-CM | POA: Diagnosis not present

## 2020-12-11 DIAGNOSIS — N390 Urinary tract infection, site not specified: Secondary | ICD-10-CM | POA: Diagnosis not present

## 2020-12-11 DIAGNOSIS — R262 Difficulty in walking, not elsewhere classified: Secondary | ICD-10-CM | POA: Diagnosis not present

## 2020-12-11 DIAGNOSIS — I5032 Chronic diastolic (congestive) heart failure: Secondary | ICD-10-CM | POA: Diagnosis not present

## 2020-12-11 DIAGNOSIS — R2689 Other abnormalities of gait and mobility: Secondary | ICD-10-CM | POA: Diagnosis not present

## 2020-12-11 DIAGNOSIS — J9600 Acute respiratory failure, unspecified whether with hypoxia or hypercapnia: Secondary | ICD-10-CM | POA: Diagnosis not present

## 2020-12-11 DIAGNOSIS — I480 Paroxysmal atrial fibrillation: Secondary | ICD-10-CM | POA: Diagnosis not present

## 2020-12-11 DIAGNOSIS — Z743 Need for continuous supervision: Secondary | ICD-10-CM | POA: Diagnosis not present

## 2020-12-11 DIAGNOSIS — I10 Essential (primary) hypertension: Secondary | ICD-10-CM | POA: Diagnosis not present

## 2020-12-11 DIAGNOSIS — M810 Age-related osteoporosis without current pathological fracture: Secondary | ICD-10-CM | POA: Diagnosis not present

## 2020-12-11 DIAGNOSIS — R2681 Unsteadiness on feet: Secondary | ICD-10-CM | POA: Diagnosis not present

## 2020-12-11 DIAGNOSIS — U071 COVID-19: Secondary | ICD-10-CM | POA: Diagnosis not present

## 2020-12-11 DIAGNOSIS — R498 Other voice and resonance disorders: Secondary | ICD-10-CM | POA: Diagnosis not present

## 2020-12-11 LAB — CBC
HCT: 31.1 % — ABNORMAL LOW (ref 36.0–46.0)
Hemoglobin: 10 g/dL — ABNORMAL LOW (ref 12.0–15.0)
MCH: 33.4 pg (ref 26.0–34.0)
MCHC: 32.2 g/dL (ref 30.0–36.0)
MCV: 104 fL — ABNORMAL HIGH (ref 80.0–100.0)
Platelets: 104 10*3/uL — ABNORMAL LOW (ref 150–400)
RBC: 2.99 MIL/uL — ABNORMAL LOW (ref 3.87–5.11)
RDW: 15 % (ref 11.5–15.5)
WBC: 5.9 10*3/uL (ref 4.0–10.5)
nRBC: 0.5 % — ABNORMAL HIGH (ref 0.0–0.2)

## 2020-12-11 LAB — COMPREHENSIVE METABOLIC PANEL
ALT: 26 U/L (ref 0–44)
AST: 43 U/L — ABNORMAL HIGH (ref 15–41)
Albumin: 2.7 g/dL — ABNORMAL LOW (ref 3.5–5.0)
Alkaline Phosphatase: 59 U/L (ref 38–126)
Anion gap: 13 (ref 5–15)
BUN: 22 mg/dL (ref 8–23)
CO2: 24 mmol/L (ref 22–32)
Calcium: 8.7 mg/dL — ABNORMAL LOW (ref 8.9–10.3)
Chloride: 98 mmol/L (ref 98–111)
Creatinine, Ser: 1 mg/dL (ref 0.44–1.00)
GFR, Estimated: 57 mL/min — ABNORMAL LOW (ref >60)
Glucose, Bld: 134 mg/dL — ABNORMAL HIGH (ref 70–99)
Potassium: 4.5 mmol/L (ref 3.5–5.1)
Sodium: 135 mmol/L (ref 135–145)
Total Bilirubin: 1.7 mg/dL — ABNORMAL HIGH (ref 0.3–1.2)
Total Protein: 5.9 g/dL — ABNORMAL LOW (ref 6.5–8.1)

## 2020-12-11 MED ORDER — PREDNISONE 10 MG PO TABS: ORAL_TABLET | ORAL | 0 refills | Status: AC

## 2020-12-11 MED ORDER — METOPROLOL TARTRATE 25 MG PO TABS: 25.0000 mg | ORAL_TABLET | Freq: Two times a day (BID) | ORAL | Status: DC

## 2020-12-11 NOTE — Plan of Care (Signed)
  Problem: Education: Goal: Knowledge of risk factors and measures for prevention of condition will improve Outcome: Progressing   Problem: Clinical Measurements: Goal: Respiratory complications will improve Outcome: Progressing   Problem: Respiratory: Goal: Complications related to the disease process, condition or treatment will be avoided or minimized Outcome: Progressing

## 2020-12-11 NOTE — TOC Transition Note (Signed)
Transition of Care [facility]) - CM/SW Discharge Note   Patient Details  Name: Michele Green MRN: [id number] Date of Birth: 10/14/1941  Transition of Care [facility] CM/SW Contact:  Trish Mage, LCSW Phone Number: 12/11/2020, 1:29 PM   Clinical Narrative:   Patient who has been stable for d/c since 1/18 now has a bed at [facility] d/c today. Family alerted. [facility] alerted.  PTAR arranged. Nursing, please call report to [phone], room 102P.  TOC sign off.    Final next level of care: Skilled Nursing Facility Barriers to Discharge: Barriers Resolved   Patient Goals and CMS Choice Patient states their goals for this hospitalization and ongoing recovery are:: to go back to the house CMS Medicare.gov Compare Post Acute Care list provided to:: Patient    Discharge Placement                       Discharge Plan and Services   Discharge Planning Services: CM Consult                                 Social Determinants of Health (SDOH) Interventions     Readmission Risk Interventions No flowsheet data found.

## 2020-12-11 NOTE — Discharge Summary (Signed)
Physician Discharge Summary  Michele Green ZOX:[id number] DOB: Dec 25, 1940 DOA: 12/01/2020  PCP: [name], MD  Admit date: 12/01/2020 Discharge date: 12/11/2020  Admitted From: ALF Disposition: SNF  Recommendations for Outpatient Follow-up:  1. Follow up with PCP in 1 week 2. Please obtain BMP/CBC in one week 3. Please follow up on the following pending results: None  Discharge Condition: Stable CODE STATUS: DNR Diet recommendation: Heart healthy   Brief/Interim Summary:  Admission HPI written by Shelly Coss, MD   Chief Complaint: Shortness of breath  HPI: Michele Green is a 80 y.o. female with medical history significant of permanent A. fib, ulcerative colitis, vertebral compression fracture who has been fully vaccinated presented from [facility] with complaints of shortness of breath and cough.  She has been sick for last 3 days.  She recently tested positive at ALF . she said couple of residents over there are also sick.  No report of fever. On presentation she was in A. fib with RVR.  Heart rate was in the range of 180s.  She was hypoxic on room air saturating 86% which improved to 97% on 3 L.  She was afebrile on presentation. Patient seen and examined at the bedside in the emergency department.  During my evaluation, she was still in A. fib with RVR with heart rate in the range of 140s. She was able to speak in full sentences, complains of some shortness of breath but did not appear to be in severe respiratory distress. She denies any abdominal pain, nausea, vomiting, diarrhea, fever, chest pain, palpitations, hematochezia or melena.  Complains of severe weakness. Patient was admitted and was discharged from here on 09/19/2020 when she presented with back pain and was found to have T8 compression fracture and underwent kyphoplasty.    Hospital course:  Acute respiratory failure with hypoxia Secondary to COVID-19 infection with resultant pneumonia.  Patient requiring as much as 5 L of oxygen via [place] and has been weaned to 2 L via [place]. Continue to wean to room air as able.  COVID-19 pneumonia Patient was treated with Remdesivir IV for three days in addition to steroids for another three days. Baricitinib not started. CRP and d-dimer trended down near admission; no repeat performed. Continue prednisone 40 mg with taper as prescribed.  Atrial fibrillation with RVR Likely related to not being on her home rate control medication. She is also on Xarelto as an outpatient. RVR resolved. Continue metoprolol, digoxin, diltiazem and Xarelto. Metoprolol dose decreased secondary to mild hypotension. Recommend continued to monitor heart rates/BP and titrate up metoprolol as able.  Chronic diastolic heart failure Stable.  Primary hypertension Blood pressure somewhat soft in setting of rate control medications. Improved this morning. Metoprolol already to 25 mg BID  History of ulcerative colitis On mesalamine and Humira as an outpatient  History of breast cancer Patient follows with oncology as an outpatient. No acute issues.  Pressure injury Bilateral buttock, POA  Discharge Diagnoses:  Principal Problem:   Pneumonia due to COVID-19 virus Active Problems:   Essential hypertension   Persistent atrial fibrillation (HCC)   Breast cancer of upper-outer quadrant of left female breast (HCC)   Chronic diastolic HF (heart failure) (HCC)   Atrial fibrillation with RVR (HCC)   Pressure injury of skin   Acute respiratory failure with hypoxia [facility])    Discharge Instructions  Discharge Instructions    Diet - low sodium heart healthy   Complete by: As directed    Increase  activity slowly   Complete by: As directed    No wound care   Complete by: As directed      Allergies as of 12/11/2020      Reactions   Shellfish Allergy Itching      Medication List    STOP taking these medications   potassium chloride SA 20 MEQ tablet Commonly  known as: [place]-Con M20     TAKE these medications   acetaminophen 500 MG tablet Commonly known as: TYLENOL Take 500-1,000 mg by mouth every 6 (six) hours as needed for moderate pain or headache.   alendronate 70 MG tablet Commonly known as: FOSAMAX Take 70 mg by mouth once a week.   allopurinol 100 MG tablet Commonly known as: ZYLOPRIM Take 200 mg by mouth every evening.   atorvastatin 20 MG tablet Commonly known as: LIPITOR TAKE 1 TABLET BY MOUTH EVERY DAY   bisacodyl 10 MG suppository Commonly known as: DULCOLAX Place 1 suppository (10 mg total) rectally daily as needed for moderate constipation.   cyclobenzaprine 5 MG tablet Commonly known as: FLEXERIL Take 1 tablet (5 mg total) by mouth 3 (three) times daily as needed for muscle spasms.   digoxin 0.125 MG tablet Commonly known as: LANOXIN TAKE ONE TABLET BY MOUTH DAILY MONDAY THROUGH FRIDAY. DO NOT TAKE ON SATURDAY OR SUNDAY. What changed: See the new instructions.   diltiazem 180 MG 24 hr capsule Commonly known as: CARDIZEM CD Take 180 mg by mouth daily.   docusate sodium 100 MG capsule Commonly known as: COLACE Take 1 capsule (100 mg total) by mouth 2 (two) times daily.   feeding supplement Liqd Take 237 mLs by mouth 2 (two) times daily between meals.   fentaNYL 12 MCG/HR Commonly known as: [place] 1 patch onto the skin every 3 (three) days. What changed: when to take this   mesalamine 0.375 g 24 hr capsule Commonly known as: APRISO Take 0.375 g by mouth daily.   metoprolol tartrate 25 MG tablet Commonly known as: LOPRESSOR Take 1 tablet (25 mg total) by mouth 2 (two) times daily. What changed:   medication strength  how much to take   multivitamin with minerals Tabs tablet Take 1 tablet by mouth daily.   oxyCODONE 5 MG immediate release tablet Commonly known as: Oxy IR/ROXICODONE Take 1 tablet (5 mg total) by mouth every 6 (six) hours as needed for severe pain.   predniSONE 10 MG  tablet Commonly known as: DELTASONE Take 4 tablets (40 mg total) by mouth daily with breakfast for 1 day, THEN 3 tablets (30 mg total) daily with breakfast for 2 days, THEN 2 tablets (20 mg total) daily with breakfast for 2 days, THEN 1 tablet (10 mg total) daily with breakfast for 2 days. Start taking on: December 12, 2020   tamoxifen 20 MG tablet Commonly known as: NOLVADEX Take 20 mg by mouth daily.   Xarelto 15 MG Tabs tablet Generic drug: Rivaroxaban TAKE 1 TABLET BY MOUTH  DAILY WITH SUPPER What changed: See the new instructions.            Durable Medical Equipment  (From admission, onward)         Start     Ordered   12/08/20 1009  For home use only DME oxygen  Once       Question Answer Comment  Length of Need 6 Months   Mode or (Route) Nasal cannula   Liters per Minute 2   Frequency Continuous (stationary and  portable oxygen unit needed)   Oxygen delivery system Gas      12/08/20 1009          Contact information for after-discharge care    Destination    [facility] Preferred SNF .   Service: Skilled Nursing Contact information: [place] [address] [phone]                 Allergies  Allergen Reactions  . Shellfish Allergy Itching    Consultations:  None   Procedures/Studies: DG Chest Port 1 View  Result Date: 12/01/2020 CLINICAL DATA:  Shortness of breath EXAM: PORTABLE CHEST 1 VIEW COMPARISON:  09/12/2020 FINDINGS: Median sternotomy with prosthetic cardiac valve and left atrial appendage clip. Stable heart size. Atherosclerotic calcification of the aortic knob. Interval development of patchy opacities within the peripheral aspects of the mid to lower lung fields bilaterally, left worse than right. No large pleural fluid collection. No pneumothorax. IMPRESSION: Interval development of patchy opacities within the peripheral aspects of the mid to lower lung fields bilaterally, left worse than right.  Findings suggestive of multifocal atypical/viral pneumonia. Electronically Signed   By: Davina Poke D.O.   On: 12/01/2020 16:00   [place] EKG SITE RITE  Result Date: 12/07/2020 If Site Rite image not attached, placement could not be confirmed due to current cardiac rhythm.  [place] EKG SITE RITE  Result Date: 12/04/2020 If Site Rite image not attached, placement could not be confirmed due to current cardiac rhythm.     Subjective: No issues this morning  Discharge Exam: Vitals:   12/11/20 0518 12/11/20 1300  BP: (!) 109/26 113/62  Pulse: 70 100  Resp: 19 18  Temp: (!) 97.5 F (36.4 C) 97.8 F (36.6 C)  SpO2: 95% 95%   Vitals:   12/10/20 1803 12/10/20 2014 12/11/20 0518 12/11/20 1300  BP: (!) 99/59 (!) 111/53 (!) 109/26 113/62  Pulse: 96 91 70 100  Resp: 20 19 19 18   Temp:  97.9 F (36.6 C) (!) 97.5 F (36.4 C) 97.8 F (36.6 C)  TempSrc:    Oral  SpO2: 96% 90% 95% 95%  Weight:      Height:         General exam: Appears calm and comfortable Respiratory system: Clear to auscultation. Respiratory effort normal. Cardiovascular system: S1 & S2 heard, Tachycardia, normal rhythm. No murmurs. Gastrointestinal system: Abdomen is nondistended, soft and nontender. No organomegaly or masses felt. Normal bowel sounds heard. Central nervous system: Alert and oriented. No focal neurological deficits. Musculoskeletal: No edema. No calf tenderness Skin: No cyanosis. No rashes Psychiatry: Judgement and insight appear normal. Mood & affect appropriate.   The results of significant diagnostics from this hospitalization (including imaging, microbiology, ancillary and laboratory) are listed below for reference.     Microbiology: Recent Results (from the past 240 hour(s))  Blood Culture (routine x 2)     Status: None   Collection Time: 12/01/20  4:33 PM   Specimen: BLOOD  Result Value Ref Range Status   Specimen Description   Final    BLOOD RIGHT ANTECUBITAL Performed at [facility] [address], [place], [place] [address]    Special Requests   Final    BOTTLES DRAWN AEROBIC AND ANAEROBIC Blood Culture adequate volume Performed at [facility] [address] [place], [place] [address]    Culture   Final    NO GROWTH 5 DAYS Performed at [facility], [address] [place], [place] [address]    Report Status 12/06/2020 FINAL  Final  Blood Culture (routine x 2)     Status: None   Collection Time: 12/01/20  4:33 PM   Specimen: BLOOD  Result Value Ref Range Status   Specimen Description   Final    BLOOD BLOOD RIGHT FOREARM Performed at [facility] [address] [place], [place] [address]    Special Requests   Final    BOTTLES DRAWN AEROBIC AND ANAEROBIC Blood Culture adequate volume Performed at [facility] [address], [place], [place] [address]    Culture   Final    NO GROWTH 5 DAYS Performed at [facility], [place] [address] [place], [place] [address]    Report Status 12/06/2020 FINAL  Final  MRSA PCR Screening     Status: None   Collection Time: 12/02/20  1:32 AM   Specimen: Nasopharyngeal  Result Value Ref Range Status   MRSA by PCR NEGATIVE NEGATIVE Final    Comment:        The GeneXpert MRSA Assay (FDA approved for NASAL specimens only), is one component of a comprehensive MRSA colonization surveillance program. It is not intended to diagnose MRSA infection nor to guide or monitor treatment for MRSA infections. Performed at [facility], [place] [address] [place], [place] [address]      Labs: BNP (last 3 results) Recent Labs    05/17/20 1230 08/16/20 1452  BNP 237.4* 440.1*   Basic Metabolic Panel: Recent Labs  Lab 12/07/20 1123 12/08/20 0309 12/09/20 0412 12/11/20 0420  NA 140 145 139 135  K 3.0* 5.4* 3.4* 4.5  CL 109 112* 98 98  CO2 28 20* 30 24  GLUCOSE 84 86 98 134*  BUN 16 20 15 22   CREATININE  0.78 0.94 0.86 1.00  CALCIUM 8.1* 8.4* 8.4* 8.7*   Liver Function Tests: Recent Labs  Lab 12/11/20 0420  AST 43*  ALT 26  ALKPHOS 59  BILITOT 1.7*  PROT 5.9*  ALBUMIN 2.7*   No results for input(s): LIPASE, AMYLASE in the last 168 hours. No results for input(s): AMMONIA in the last 168 hours. CBC: Recent Labs  Lab 12/07/20 1123 12/08/20 0309 12/09/20 0412 12/11/20 0420  WBC 6.3 6.7 5.4 5.9  NEUTROABS 4.9 4.8 3.9  --   HGB 9.5* 10.4* 11.2* 10.0*  HCT 29.7* 30.5* 35.2* 31.1*  MCV 104.9* 100.7* 106.0* 104.0*  PLT 121* 122* 100* 104*   Cardiac Enzymes: No results for input(s): CKTOTAL, CKMB, CKMBINDEX, TROPONINI in the last 168 hours. BNP: Invalid input(s): POCBNP CBG: No results for input(s): GLUCAP in the last 168 hours. D-Dimer No results for input(s): DDIMER in the last 72 hours. Hgb A1c No results for input(s): HGBA1C in the last 72 hours. Lipid Profile No results for input(s): CHOL, HDL, LDLCALC, TRIG, CHOLHDL, LDLDIRECT in the last 72 hours. Thyroid function studies No results for input(s): TSH, T4TOTAL, T3FREE, THYROIDAB in the last 72 hours.  Invalid input(s): FREET3 Anemia work up No results for input(s): VITAMINB12, FOLATE, FERRITIN, TIBC, IRON, RETICCTPCT in the last 72 hours. Urinalysis    Component Value Date/Time   COLORURINE YELLOW 08/17/2020 [place] 08/17/2020 0434   LABSPEC 1.011 08/17/2020 [place] 5.0 08/17/2020 [place] 08/17/2020 0434   HGBUR NEGATIVE 08/17/2020 [place] NEGATIVE 08/17/2020 [place] 08/17/2020 0434   PROTEINUR NEGATIVE 08/17/2020  [place] 08/17/2020 [place] 08/17/2020 0434   Sepsis Labs Invalid input(s): PROCALCITONIN,  WBC,  LACTICIDVEN Microbiology Recent Results (from the past 240 hour(s))  Blood Culture (routine x 2)     Status: None   Collection Time: 12/01/20  4:33 PM   Specimen: BLOOD  Result Value Ref  Range Status   Specimen Description   Final    BLOOD RIGHT ANTECUBITAL Performed at [facility] [address], [place], [place] [address]    Special Requests   Final    BOTTLES DRAWN AEROBIC AND ANAEROBIC Blood Culture adequate volume Performed at [facility] [address] [place], [place] [address]    Culture   Final    NO GROWTH 5 DAYS Performed at [facility], [place] [address], [place], [place] [address]    Report Status 12/06/2020 FINAL  Final  Blood Culture (routine x 2)     Status: None   Collection Time: 12/01/20  4:33 PM   Specimen: BLOOD  Result Value Ref Range Status   Specimen Description   Final    BLOOD BLOOD RIGHT FOREARM Performed at [facility] [address] [place], [place] [address]    Special Requests   Final    BOTTLES DRAWN AEROBIC AND ANAEROBIC Blood Culture adequate volume Performed at [facility] [address] [place], [place] [address]    Culture   Final    NO GROWTH 5 DAYS Performed at [facility], [place] [address] [place], [place] [address]    Report Status 12/06/2020 FINAL  Final  MRSA PCR Screening     Status: None   Collection Time: 12/02/20  1:32 AM   Specimen: Nasopharyngeal  Result Value Ref Range Status   MRSA by PCR NEGATIVE NEGATIVE Final    Comment:        The GeneXpert MRSA Assay (FDA approved for NASAL specimens only), is one component of a comprehensive MRSA colonization surveillance program. It is not intended to diagnose MRSA infection nor to guide or monitor treatment for MRSA infections. Performed at [facility], [place] [address] [place], [place] [address]      Time coordinating discharge: 35 minutes  SIGNED:   Cordelia Poche, MD [facility] 12/11/2020, 2:07 PM

## 2020-12-11 NOTE — Progress Notes (Signed)
Attempted to give report to receiving Rn at [facility] x 3.  Left message with Sec to notify receiving Rn to call this Rn in regard to discharge report

## 2020-12-11 NOTE — Progress Notes (Signed)
PROGRESS NOTE    [name]  ZTI:[id number] DOB: 1941/10/21 DOA: 12/01/2020 PCP: [name], MD   Brief Narrative: Michele Green is a 80 y.o. female with a history of atrial fibrillation on Xarelto, ulcerative colitis, hypertension. Patient presented secondary to shortness of breath and was found to have evidence of pneumonia secondary to COVID-19 infection.   Assessment & Plan:   Principal Problem:   Pneumonia due to COVID-19 virus Active Problems:   Essential hypertension   Persistent atrial fibrillation (HCC)   Breast cancer of upper-outer quadrant of left female breast (HCC)   Chronic diastolic HF (heart failure) (HCC)   Atrial fibrillation with RVR (HCC)   Pressure injury of skin   Acute respiratory failure with hypoxia (HCC)   Acute respiratory failure with hypoxia Secondary to COVID-19 infection with resultant pneumonia. Patient requiring as much as 5 L of oxygen via [place] and has been weaned to 2 L via [place] -Wean to room air as able -Keep O2 >85%  COVID-19 pneumonia Patient was treated with Remdesivir IV for three days in addition to steroids for another three days. Baricitinib not started. CRP and d-dimer trended down near admission; no repeat performed. -Continue prednisone 40 mg daily; complete 10 day total course  Atrial fibrillation with RVR Likely related to not being on her home rate control medication. She is also on Xarelto as an outpatient. RVR resolved. -Continue metoprolol, digoxin, diltiazem -Continue Xarelto  Chronic diastolic heart failure Stable.  Primary hypertension Blood pressure somewhat soft in setting of rate control medications. Improved this morning. Metoprolol already decreased to 50 mg BID -Continue metoprolol 25 mg BID -Continue diltiazem-Watch heart rates  History of ulcerative colitis On mesalamine and Humira as an outpatient -Continue mesalamine  History of breast cancer Patient follows with oncology as an  outpatient. No acute issues.  Pressure injury Bilateral buttock, POA   DVT prophylaxis: Xarelto Code Status:   Code Status: DNR Family Communication: None at bedside Disposition Plan: Discharge to SNF when bed is available. Medically stable for discharge.   Consultants:   Palliative care medicine  Procedures:   None  Antimicrobials:  Remdesivir    Subjective: Angry about PT having her in a chair for one hour yesterday. No other concerns.  Objective: Vitals:   12/10/20 1500 12/10/20 1803 12/10/20 2014 12/11/20 0518  BP: (!) 102/34 (!) 99/59 (!) 111/53 (!) 109/26  Pulse: 60 96 91 70  Resp: 16 20 19 19   Temp: 98.1 F (36.7 C)  97.9 F (36.6 C) (!) 97.5 F (36.4 C)  TempSrc: Oral     SpO2: 94% 96% 90% 95%  Weight:      Height:       No intake or output data in the 24 hours ending 12/11/20 1251 Filed Weights   12/01/20 1419 12/02/20 0200  Weight: 58.1 kg 56.7 kg    Examination:  General exam: Appears calm and comfortable Respiratory system: Clear to auscultation. Respiratory effort normal. Cardiovascular system: S1 & S2 heard, Tachycardia, normal rhythm. No murmurs. Gastrointestinal system: Abdomen is nondistended, soft and nontender. No organomegaly or masses felt. Normal bowel sounds heard. Central nervous system: Alert and oriented. No focal neurological deficits. Musculoskeletal: No edema. No calf tenderness Skin: No cyanosis. No rashes Psychiatry: Judgement and insight appear normal. Mood & affect appropriate.   Data Reviewed: I have personally reviewed following labs and imaging studies  CBC Lab Results  Component Value Date   WBC 5.9 12/11/2020   RBC 2.99 (L)  12/11/2020   HGB 10.0 (L) 12/11/2020   HCT 31.1 (L) 12/11/2020   MCV 104.0 (H) 12/11/2020   MCH 33.4 12/11/2020   PLT 104 (L) 12/11/2020   MCHC 32.2 12/11/2020   RDW 15.0 12/11/2020   LYMPHSABS 0.6 (L) 12/09/2020   MONOABS 0.6 12/09/2020   EOSABS 0.0 12/09/2020   BASOSABS 0.0  81/27/5170     Last metabolic panel Lab Results  Component Value Date   NA 135 12/11/2020   K 4.5 12/11/2020   CL 98 12/11/2020   CO2 24 12/11/2020   BUN 22 12/11/2020   CREATININE 1.00 12/11/2020   GLUCOSE 134 (H) 12/11/2020   GFRNONAA 57 (L) 12/11/2020   GFRAA >60 08/20/2020   CALCIUM 8.7 (L) 12/11/2020   PHOS 4.1 12/03/2020   PROT 5.9 (L) 12/11/2020   ALBUMIN 2.7 (L) 12/11/2020   LABGLOB 2.7 06/14/2018   AGRATIO 1.5 06/14/2018   BILITOT 1.7 (H) 12/11/2020   ALKPHOS 59 12/11/2020   AST 43 (H) 12/11/2020   ALT 26 12/11/2020   ANIONGAP 13 12/11/2020    CBG (last 3)  No results for input(s): GLUCAP in the last 72 hours.   GFR: Estimated Creatinine Clearance: 38.1 mL/min (by C-G formula based on SCr of 1 mg/dL).  Coagulation Profile: No results for input(s): INR, PROTIME in the last 168 hours.  Recent Results (from the past 240 hour(s))  Blood Culture (routine x 2)     Status: None   Collection Time: 12/01/20  4:33 PM   Specimen: BLOOD  Result Value Ref Range Status   Specimen Description   Final    BLOOD RIGHT ANTECUBITAL Performed at [facility] [address] [place], [place] [address]    Special Requests   Final    BOTTLES DRAWN AEROBIC AND ANAEROBIC Blood Culture adequate volume Performed at [facility] [address], [place], [place] [address]    Culture   Final    NO GROWTH 5 DAYS Performed at [facility], [place] [address], [place], [place] [address]    Report Status 12/06/2020 FINAL  Final  Blood Culture (routine x 2)     Status: None   Collection Time: 12/01/20  4:33 PM   Specimen: BLOOD  Result Value Ref Range Status   Specimen Description   Final    BLOOD BLOOD RIGHT FOREARM Performed at [facility] [address] [place], [place] [address]    Special Requests   Final    BOTTLES DRAWN AEROBIC AND ANAEROBIC Blood Culture adequate volume Performed at [facility] [address], [place], [place] [address]    Culture   Final    NO GROWTH 5 DAYS Performed at [facility], [place] [address] [place], [place] [address]    Report Status 12/06/2020 FINAL  Final  MRSA PCR Screening     Status: None   Collection Time: 12/02/20  1:32 AM   Specimen: Nasopharyngeal  Result Value Ref Range Status   MRSA by PCR NEGATIVE NEGATIVE Final    Comment:        The GeneXpert MRSA Assay (FDA approved for NASAL specimens only), is one component of a comprehensive MRSA colonization surveillance program. It is not intended to diagnose MRSA infection nor to guide or monitor treatment for MRSA infections. Performed at [facility], [place] [address], [place], [place] [address]         Radiology Studies: No results found.  Scheduled Meds: . atorvastatin  20 mg Oral QHS  . digoxin  0.125 mg Oral Daily  . diltiazem  30 mg Oral Q6H  . fentaNYL  1 patch Transdermal Q72H  . mesalamine  1,500 mg Oral QHS  . metoprolol tartrate  25 mg Oral BID  . predniSONE  40 mg Oral Q breakfast  . Rivaroxaban  15 mg Oral Q supper  . tamoxifen  20 mg Oral Daily   Continuous Infusions:   LOS: 10 days     Cordelia Poche, MD [facility] 12/11/2020, 12:51 PM  If 7PM-7AM, please contact night-coverage [email or web address]

## 2020-12-11 NOTE — Discharge Instructions (Signed)
10 Things You Can Do to Manage Your COVID-19 Symptoms at Home If you have possible or confirmed COVID-19: 1. Stay home except to get medical care. 2. Monitor your symptoms carefully. If your symptoms get worse, call your healthcare provider immediately. 3. Get rest and stay hydrated. 4. If you have a medical appointment, call the healthcare provider ahead of time and tell them that you have or may have COVID-19. 5. For medical emergencies, call 911 and notify the dispatch personnel that you have or may have COVID-19. 6. Cover your cough and sneezes with a tissue or use the inside of your elbow. 7. Wash your hands often with soap and water for at least 20 seconds or clean your hands with an alcohol-based hand sanitizer that contains at least 60% alcohol. 8. As much as possible, stay in a specific room and away from other people in your home. Also, you should use a separate bathroom, if available. If you need to be around other people in or outside of the home, wear a mask. 9. Avoid sharing personal items with other people in your household, like dishes, towels, and bedding. 10. Clean all surfaces that are touched often, like counters, tabletops, and doorknobs. Use household cleaning sprays or wipes according to the label instructions. [email or web address] 06/06/2020 This information is not intended to replace advice given to you by your health care provider. Make sure you discuss any questions you have with your health care provider. Document Revised: 09/22/2020 Document Reviewed: 09/22/2020 Elsevier Patient Education  2021 [place],  You were in the hospital with pneumonia from COVID-19. You still require oxygen but are doing much better. Please continue your oxygen as prescribed until you can wean off per your doctors instruction.

## 2020-12-11 NOTE — Progress Notes (Signed)
Report given to receiving nurse, Ptar in pts room for transport no complaints noted

## 2020-12-12 DIAGNOSIS — J1282 Pneumonia due to coronavirus disease 2019: Secondary | ICD-10-CM | POA: Diagnosis not present

## 2020-12-12 DIAGNOSIS — R262 Difficulty in walking, not elsewhere classified: Secondary | ICD-10-CM | POA: Diagnosis not present

## 2020-12-12 DIAGNOSIS — K5190 Ulcerative colitis, unspecified, without complications: Secondary | ICD-10-CM | POA: Diagnosis not present

## 2020-12-12 DIAGNOSIS — I10 Essential (primary) hypertension: Secondary | ICD-10-CM | POA: Diagnosis not present

## 2020-12-12 DIAGNOSIS — C50912 Malignant neoplasm of unspecified site of left female breast: Secondary | ICD-10-CM | POA: Diagnosis not present

## 2020-12-12 DIAGNOSIS — J9601 Acute respiratory failure with hypoxia: Secondary | ICD-10-CM | POA: Diagnosis not present

## 2020-12-12 DIAGNOSIS — E782 Mixed hyperlipidemia: Secondary | ICD-10-CM | POA: Diagnosis not present

## 2020-12-12 DIAGNOSIS — I4891 Unspecified atrial fibrillation: Secondary | ICD-10-CM | POA: Diagnosis not present

## 2020-12-12 DIAGNOSIS — I509 Heart failure, unspecified: Secondary | ICD-10-CM | POA: Diagnosis not present

## 2020-12-12 DIAGNOSIS — M8000XD Age-related osteoporosis with current pathological fracture, unspecified site, subsequent encounter for fracture with routine healing: Secondary | ICD-10-CM | POA: Diagnosis not present

## 2020-12-12 DIAGNOSIS — J9600 Acute respiratory failure, unspecified whether with hypoxia or hypercapnia: Secondary | ICD-10-CM | POA: Diagnosis not present

## 2020-12-12 DIAGNOSIS — M6281 Muscle weakness (generalized): Secondary | ICD-10-CM | POA: Diagnosis not present

## 2020-12-12 DIAGNOSIS — U071 COVID-19: Secondary | ICD-10-CM | POA: Diagnosis not present

## 2020-12-15 DIAGNOSIS — M8000XD Age-related osteoporosis with current pathological fracture, unspecified site, subsequent encounter for fracture with routine healing: Secondary | ICD-10-CM | POA: Diagnosis not present

## 2020-12-15 DIAGNOSIS — E782 Mixed hyperlipidemia: Secondary | ICD-10-CM | POA: Diagnosis not present

## 2020-12-15 DIAGNOSIS — C50912 Malignant neoplasm of unspecified site of left female breast: Secondary | ICD-10-CM | POA: Diagnosis not present

## 2020-12-15 DIAGNOSIS — M6281 Muscle weakness (generalized): Secondary | ICD-10-CM | POA: Diagnosis not present

## 2020-12-15 DIAGNOSIS — K5190 Ulcerative colitis, unspecified, without complications: Secondary | ICD-10-CM | POA: Diagnosis not present

## 2020-12-15 DIAGNOSIS — J9600 Acute respiratory failure, unspecified whether with hypoxia or hypercapnia: Secondary | ICD-10-CM | POA: Diagnosis not present

## 2020-12-15 DIAGNOSIS — I4891 Unspecified atrial fibrillation: Secondary | ICD-10-CM | POA: Diagnosis not present

## 2020-12-15 DIAGNOSIS — I10 Essential (primary) hypertension: Secondary | ICD-10-CM | POA: Diagnosis not present

## 2020-12-15 DIAGNOSIS — R262 Difficulty in walking, not elsewhere classified: Secondary | ICD-10-CM | POA: Diagnosis not present

## 2020-12-15 DIAGNOSIS — I509 Heart failure, unspecified: Secondary | ICD-10-CM | POA: Diagnosis not present

## 2020-12-17 DIAGNOSIS — I509 Heart failure, unspecified: Secondary | ICD-10-CM | POA: Diagnosis not present

## 2020-12-17 DIAGNOSIS — I4891 Unspecified atrial fibrillation: Secondary | ICD-10-CM | POA: Diagnosis not present

## 2020-12-17 DIAGNOSIS — U071 COVID-19: Secondary | ICD-10-CM | POA: Diagnosis not present

## 2020-12-18 DIAGNOSIS — C50912 Malignant neoplasm of unspecified site of left female breast: Secondary | ICD-10-CM | POA: Diagnosis not present

## 2020-12-18 DIAGNOSIS — M8000XD Age-related osteoporosis with current pathological fracture, unspecified site, subsequent encounter for fracture with routine healing: Secondary | ICD-10-CM | POA: Diagnosis not present

## 2020-12-18 DIAGNOSIS — J9600 Acute respiratory failure, unspecified whether with hypoxia or hypercapnia: Secondary | ICD-10-CM | POA: Diagnosis not present

## 2020-12-18 DIAGNOSIS — I10 Essential (primary) hypertension: Secondary | ICD-10-CM | POA: Diagnosis not present

## 2020-12-18 DIAGNOSIS — J9601 Acute respiratory failure with hypoxia: Secondary | ICD-10-CM | POA: Diagnosis not present

## 2020-12-18 DIAGNOSIS — I480 Paroxysmal atrial fibrillation: Secondary | ICD-10-CM | POA: Diagnosis not present

## 2020-12-18 DIAGNOSIS — I509 Heart failure, unspecified: Secondary | ICD-10-CM | POA: Diagnosis not present

## 2020-12-18 DIAGNOSIS — M6281 Muscle weakness (generalized): Secondary | ICD-10-CM | POA: Diagnosis not present

## 2020-12-18 DIAGNOSIS — E782 Mixed hyperlipidemia: Secondary | ICD-10-CM | POA: Diagnosis not present

## 2020-12-18 DIAGNOSIS — I5032 Chronic diastolic (congestive) heart failure: Secondary | ICD-10-CM | POA: Diagnosis not present

## 2020-12-18 DIAGNOSIS — I4891 Unspecified atrial fibrillation: Secondary | ICD-10-CM | POA: Diagnosis not present

## 2020-12-18 DIAGNOSIS — R300 Dysuria: Secondary | ICD-10-CM | POA: Diagnosis not present

## 2020-12-18 DIAGNOSIS — R262 Difficulty in walking, not elsewhere classified: Secondary | ICD-10-CM | POA: Diagnosis not present

## 2020-12-18 DIAGNOSIS — K5190 Ulcerative colitis, unspecified, without complications: Secondary | ICD-10-CM | POA: Diagnosis not present

## 2020-12-22 DIAGNOSIS — C50912 Malignant neoplasm of unspecified site of left female breast: Secondary | ICD-10-CM | POA: Diagnosis not present

## 2020-12-22 DIAGNOSIS — M6281 Muscle weakness (generalized): Secondary | ICD-10-CM | POA: Diagnosis not present

## 2020-12-22 DIAGNOSIS — J9600 Acute respiratory failure, unspecified whether with hypoxia or hypercapnia: Secondary | ICD-10-CM | POA: Diagnosis not present

## 2020-12-22 DIAGNOSIS — M8000XD Age-related osteoporosis with current pathological fracture, unspecified site, subsequent encounter for fracture with routine healing: Secondary | ICD-10-CM | POA: Diagnosis not present

## 2020-12-22 DIAGNOSIS — I509 Heart failure, unspecified: Secondary | ICD-10-CM | POA: Diagnosis not present

## 2020-12-22 DIAGNOSIS — I4891 Unspecified atrial fibrillation: Secondary | ICD-10-CM | POA: Diagnosis not present

## 2020-12-22 DIAGNOSIS — E782 Mixed hyperlipidemia: Secondary | ICD-10-CM | POA: Diagnosis not present

## 2020-12-22 DIAGNOSIS — I10 Essential (primary) hypertension: Secondary | ICD-10-CM | POA: Diagnosis not present

## 2020-12-22 DIAGNOSIS — K5190 Ulcerative colitis, unspecified, without complications: Secondary | ICD-10-CM | POA: Diagnosis not present

## 2020-12-22 DIAGNOSIS — R262 Difficulty in walking, not elsewhere classified: Secondary | ICD-10-CM | POA: Diagnosis not present

## 2020-12-24 DIAGNOSIS — R300 Dysuria: Secondary | ICD-10-CM | POA: Diagnosis not present

## 2020-12-24 DIAGNOSIS — N390 Urinary tract infection, site not specified: Secondary | ICD-10-CM | POA: Diagnosis not present

## 2020-12-28 NOTE — Progress Notes (Deleted)
Cardiology Office Note:    Date:  12/28/2020   ID:  MARLEAH BEEVER, DOB [birth year], MRN [id number]  PCP:  [name], MD  Cardiologist:  Sinclair Grooms, MD   Referring MD: [name], MD   No chief complaint on file.   History of Present Illness:    Michele Green is a 80 y.o. female with a hx of CAD s/p CABGxi [place]--> LAD andsevereMR s/p repairSeptember 2019, breast cancer, HTN, history of atrial fibrillation prior to open heart surgery, Maze procedure with out atrial appendage ligation September 2019, recurrent A. fib and a flutter post surgery despite amiodarone therapy and cardioversion November 2019.Decision made by Dr. Curt Bears in March 2021 to pursue rate control rather than any further attempts at rhythm control.  ***  Past Medical History:  Diagnosis Date  . Arrhythmia   . Atypical atrial flutter ([place]) 08/14/2018   Post-operative  . Breast cancer of upper-outer quadrant of left female breast ([place]) 02/02/2016  . CAD (coronary artery disease) 06/20/2018   LHC 7/19: pLAD 65/90, oD1 90, mLCx 85, OM2 50, oRCA 30, EF 50-55 >> s/p CABG in 9/19 (L-LAD)  . Colon polyp 02/2012  . Dupuytren contracture    bilateral hands  . Family history of breast cancer   . Hyperlipidemia   . Hypertension   . Mitral regurgitation 11/07/2013   Echo 7/19: Mild LVH, EF 60-65, no RWMA, mod ot severe MR, massive LAE, PASP 33 // TEE 7/19:  Mild conc LVH, EF 60-65, no RWMA, severe MR with mild post leaflet prolapse, massive // s/p MV repair 07/2018  . On continuous oral anticoagulation 08/22/2015   Started on Xarelto 08/12/2015   . Osteopenia   . Persistent atrial fibrillation ([place]) 08/22/2015   Started late August or early September 2016 // s/p Maze procedure 07/2018  . Personal history of radiation therapy   . Radiation Therapy 04/21/16-05/19/16   left breast 47.72 Gy, boosted to 10 Gy  . 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 S/P Maze operation for atrial fibrillation 08/10/2018    Complete bilateral atrial lesion set using bipolar radiofrequency and cryothermy ablation with clipping of LA appendage  . 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 Ulcerative colitis ([place]   . Wears glasses     Past Surgical History:  Procedure Laterality Date  . BIOPSY  07/14/2020   Procedure: BIOPSY;  Surgeon: KRonnette Juniper MD;  Location: [facility];  Service: Gastroenterology;;  . BREAST BIOPSY Left 01/28/2016  . BREAST LUMPECTOMY Left 02/23/2016  . BREAST LUMPECTOMY WITH RADIOACTIVE SEED LOCALIZATION Left 02/23/2016   Procedure: BREAST LUMPECTOMY WITH RADIOACTIVE SEED LOCALIZATION;  Surgeon: BExcell Seltzer MD;  [facility]  Service: General;  Laterality: Left;  . CARDIAC CATHETERIZATION    . CARDIOVERSION N/A 10/02/2015   Procedure: CARDIOVERSION;  Surgeon: MJerline Pain MD;  [facility];  Service: Cardiovascular;  Laterality: N/A;  . CARDIOVERSION N/A 10/05/2018   Procedure: CARDIOVERSION;  Surgeon: RFay Records MD;  [facility];  Service: Cardiovascular;  Laterality: N/A;  . CARDIOVERSION N/A 11/05/2019   Procedure: CARDIOVERSION;  Surgeon: NDorothy Spark MD;  [facility]  Service: Cardiovascular;  Laterality: N/A;  . CLIPPING OF ATRIAL APPENDAGE  08/10/2018   Procedure: CLIPPING OF ATRIAL APPENDAGE;  Surgeon: ORexene Alberts MD;  [facility];  Service: Open Heart Surgery;;  . COLONOSCOPY    . COLONOSCOPY WITH PROPOFOL N/A 07/14/2020   Procedure: COLONOSCOPY WITH PROPOFOL;  Surgeon: Ronnette Juniper, MD;  [facility]: [facility];  Service: Gastroenterology;  Laterality: N/A;  . CORONARY ARTERY BYPASS GRAFT N/A 08/10/2018   Procedure: CORONARY ARTERY BYPASS GRAFTING (CABG) x 1, LIMA-LAD,  USING LEFT INTERNAL MAMMARY ARTERY. HARVESTED RIGHT GREATER SAPHENOUS VEIN  ENDOSCOPICALLY;  Surgeon: Rexene Alberts, MD;  Location: [facility];  Service: Open Heart Surgery;  Laterality: N/A;  . CYSTOSCOPY W/ RETROGRADES Bilateral 06/20/2019   Procedure: CYSTOSCOPY WITH RETROGRADE PYELOGRAM;  Surgeon: Ardis Hughs, MD;  [facility];  Service: Urology;  Laterality: Bilateral;  . CYSTOSCOPY WITH HYDRODISTENSION AND BIOPSY N/A 06/20/2019   Procedure: CYSTOSCOPY BLADDER  BIOPSY WITH FULGERATION;  Surgeon: Ardis Hughs, MD;  Location[facility];  Service: Urology;  Laterality: N/A;  . DUPUYTREN CONTRACTURE RELEASE  2001   leftx2  . [place]   right  . DUPUYTREN CONTRACTURE RELEASE Right 05/02/2014   Procedure: EXCISION DUPUYTRENS RIGHT PALMAR/SMALL ;  Surgeon: Cammie Sickle, MD;  Location: [facility];  Service: Orthopedics;  Laterality: Right;  . HEMOSTASIS CLIP PLACEMENT  07/14/2020   Procedure: HEMOSTASIS CLIP PLACEMENT;  Surgeon: Ronnette Juniper, MD;  [facility];  Service: Gastroenterology;;  . IR VERTEBROPLASTY CERV/THOR BX INC UNI/BIL INC/INJECT/IMAGING  08/19/2020  . IR VERTEBROPLASTY CERV/THOR BX INC UNI/BIL INC/INJECT/IMAGING  09/15/2020  . MAZE N/A 08/10/2018   Procedure: MAZE;  Surgeon: Rexene Alberts, MD;  Location: [facility];  Service: Open Heart Surgery;  Laterality: N/A;  . MITRAL VALVE REPAIR N/A 08/10/2018   Procedure: MITRAL VALVE REPAIR (MVR);  Surgeon: Rexene Alberts, MD;  Location: [facility];  Service: Open Heart Surgery;  Laterality: N/A;  glutaraldehyde  . POLYPECTOMY  07/14/2020   Procedure: POLYPECTOMY;  Surgeon: Ronnette Juniper, MD;  Location[facility];  Service: Gastroenterology;;  . RIGHT/LEFT HEART CATH AND CORONARY ANGIOGRAPHY N/A 06/20/2018   Procedure: RIGHT/LEFT HEART CATH AND CORONARY ANGIOGRAPHY;  Surgeon: [name], MD;  Location: [facility];  Service: Cardiovascular;  Laterality: N/A;  . TEE WITHOUT CARDIOVERSION N/A 06/20/2018    Procedure: TRANSESOPHAGEAL ECHOCARDIOGRAM (TEE);  Surgeon: Pixie Casino, MD;  [facility];  Service: Cardiovascular;  Laterality: N/A;  . TEE WITHOUT CARDIOVERSION N/A 08/10/2018   Procedure: TRANSESOPHAGEAL ECHOCARDIOGRAM (TEE);  Surgeon: Rexene Alberts, MD;  Location: [facility];  Service: Open Heart Surgery;  Laterality: N/A;  . TONSILLECTOMY  age 40    Current Medications: No outpatient medications have been marked as taking for the 12/29/20 encounter (Appointment) with [name], MD.     Allergies:   Shellfish allergy   Social History   Socioeconomic History  . Marital status: Widowed    Spouse name: Not on file  . Number of children: 1  . Years of education: Not on file  . Highest education level: Not on file  Occupational History  . Not on file  Tobacco Use  . Smoking status: Former Smoker    Packs/day: 1.00    Years: 20.00    Pack years: 20.00    Types: Cigarettes    Quit date: 11/22/1992    Years since quitting: 28.1  . Smokeless tobacco: Never Used  Vaping Use  . Vaping Use: Never used  Substance and Sexual Activity  . Alcohol use: Yes  Alcohol/week: 3.0 standard drinks    Types: 3 Standard drinks or equivalent per week    Comment: social  . Drug use: No  . Sexual activity: Not Currently    Partners: Male    Birth control/protection: Post-menopausal  Other Topics Concern  . Not on file  Social History Narrative  . Not on file   Social Determinants of Health   Financial Resource Strain: Not on file  Food Insecurity: Not on file  Transportation Needs: Not on file  Physical Activity: Not on file  Stress: Not on file  Social Connections: Not on file     Family History: The patient's family history includes Breast cancer in her sister; Breast cancer (age of onset: 107) in her mother; Cirrhosis in her mother; Heart attack in her father; Heart failure in her father; Kidney Stones in her sister. There is no history of Osteoporosis.  ROS:    Please see the history of present illness.    *** All other systems reviewed and are negative.  EKGs/Labs/Other Studies Reviewed:    The following studies were reviewed today: ***  EKG:  EKG ***  Recent Labs: 08/16/2020: B Natriuretic Peptide 482.0 12/03/2020: Magnesium 1.9 12/11/2020: ALT 26; BUN 22; Creatinine, Ser 1.00; Hemoglobin 10.0; Platelets 104; Potassium 4.5; Sodium 135  Recent Lipid Panel    Component Value Date/Time   TRIG 131 12/01/2020 1424    Physical Exam:    VS:  There were no vitals taken for this visit.    Wt Readings from Last 3 Encounters:  12/02/20 125 lb (56.7 kg)  10/06/20 126 lb (57.2 kg)  10/02/20 128 lb 1.6 oz (58.1 kg)     GEN: ***. No acute distress HEENT: Normal NECK: No JVD. LYMPHATICS: No lymphadenopathy CARDIAC: *** murmur. RRR *** gallop, or edema. VASCULAR: *** Normal Pulses. No bruits. RESPIRATORY:  Clear to auscultation without rales, wheezing or rhonchi  ABDOMEN: Soft, non-tender, non-distended, No pulsatile mass, MUSCULOSKELETAL: No deformity  SKIN: Warm and dry NEUROLOGIC:  Alert and oriented x 3 PSYCHIATRIC:  Normal affect   ASSESSMENT:    1. S/P mitral valve repair + CABG x1 + maze procedure   2. Persistent atrial fibrillation ([place])   3. S/P CABG x 1   4. Chronic diastolic HF (heart failure) ([place])   5. Essential hypertension   6. Long term (current) use of anticoagulants   7. Educated about COVID-19 virus infection    PLAN:    In order of problems listed above:  1. ***   Medication Adjustments/Labs and Tests Ordered: Current medicines are reviewed at length with the patient today.  Concerns regarding medicines are outlined above.  No orders of the defined types were placed in this encounter.  No orders of the defined types were placed in this encounter.   There are no Patient Instructions on file for this visit.   Signed, Sinclair Grooms, MD  12/28/2020 6:56 PM    [place]

## 2020-12-29 ENCOUNTER — Ambulatory Visit: Payer: Medicare Other | Admitting: Interventional Cardiology

## 2020-12-29 DIAGNOSIS — Z7189 Other specified counseling: Secondary | ICD-10-CM

## 2020-12-29 DIAGNOSIS — Z951 Presence of aortocoronary bypass graft: Secondary | ICD-10-CM

## 2020-12-29 DIAGNOSIS — I5032 Chronic diastolic (congestive) heart failure: Secondary | ICD-10-CM

## 2020-12-29 DIAGNOSIS — Z9889 Other specified postprocedural states: Secondary | ICD-10-CM

## 2020-12-29 DIAGNOSIS — I10 Essential (primary) hypertension: Secondary | ICD-10-CM

## 2020-12-29 DIAGNOSIS — I4819 Other persistent atrial fibrillation: Secondary | ICD-10-CM

## 2020-12-29 DIAGNOSIS — Z7901 Long term (current) use of anticoagulants: Secondary | ICD-10-CM

## 2021-01-01 DIAGNOSIS — R112 Nausea with vomiting, unspecified: Secondary | ICD-10-CM | POA: Diagnosis not present

## 2021-01-01 DIAGNOSIS — I480 Paroxysmal atrial fibrillation: Secondary | ICD-10-CM | POA: Diagnosis not present

## 2021-01-05 DIAGNOSIS — M8000XD Age-related osteoporosis with current pathological fracture, unspecified site, subsequent encounter for fracture with routine healing: Secondary | ICD-10-CM | POA: Diagnosis not present

## 2021-01-05 DIAGNOSIS — I10 Essential (primary) hypertension: Secondary | ICD-10-CM | POA: Diagnosis not present

## 2021-01-05 DIAGNOSIS — R262 Difficulty in walking, not elsewhere classified: Secondary | ICD-10-CM | POA: Diagnosis not present

## 2021-01-05 DIAGNOSIS — M6281 Muscle weakness (generalized): Secondary | ICD-10-CM | POA: Diagnosis not present

## 2021-01-05 DIAGNOSIS — K5190 Ulcerative colitis, unspecified, without complications: Secondary | ICD-10-CM | POA: Diagnosis not present

## 2021-01-05 DIAGNOSIS — C50912 Malignant neoplasm of unspecified site of left female breast: Secondary | ICD-10-CM | POA: Diagnosis not present

## 2021-01-05 DIAGNOSIS — I509 Heart failure, unspecified: Secondary | ICD-10-CM | POA: Diagnosis not present

## 2021-01-05 DIAGNOSIS — E782 Mixed hyperlipidemia: Secondary | ICD-10-CM | POA: Diagnosis not present

## 2021-01-05 DIAGNOSIS — J9600 Acute respiratory failure, unspecified whether with hypoxia or hypercapnia: Secondary | ICD-10-CM | POA: Diagnosis not present

## 2021-01-05 DIAGNOSIS — I4891 Unspecified atrial fibrillation: Secondary | ICD-10-CM | POA: Diagnosis not present

## 2021-01-06 DIAGNOSIS — I4891 Unspecified atrial fibrillation: Secondary | ICD-10-CM | POA: Diagnosis not present

## 2021-01-06 DIAGNOSIS — M810 Age-related osteoporosis without current pathological fracture: Secondary | ICD-10-CM | POA: Diagnosis not present

## 2021-01-06 DIAGNOSIS — R262 Difficulty in walking, not elsewhere classified: Secondary | ICD-10-CM | POA: Diagnosis not present

## 2021-01-06 DIAGNOSIS — I5032 Chronic diastolic (congestive) heart failure: Secondary | ICD-10-CM | POA: Diagnosis not present

## 2021-01-08 DIAGNOSIS — J9600 Acute respiratory failure, unspecified whether with hypoxia or hypercapnia: Secondary | ICD-10-CM | POA: Diagnosis not present

## 2021-01-08 DIAGNOSIS — C50912 Malignant neoplasm of unspecified site of left female breast: Secondary | ICD-10-CM | POA: Diagnosis not present

## 2021-01-08 DIAGNOSIS — R262 Difficulty in walking, not elsewhere classified: Secondary | ICD-10-CM | POA: Diagnosis not present

## 2021-01-08 DIAGNOSIS — I509 Heart failure, unspecified: Secondary | ICD-10-CM | POA: Diagnosis not present

## 2021-01-08 DIAGNOSIS — I5032 Chronic diastolic (congestive) heart failure: Secondary | ICD-10-CM | POA: Diagnosis not present

## 2021-01-08 DIAGNOSIS — I4891 Unspecified atrial fibrillation: Secondary | ICD-10-CM | POA: Diagnosis not present

## 2021-01-08 DIAGNOSIS — L89616 Pressure-induced deep tissue damage of right heel: Secondary | ICD-10-CM | POA: Diagnosis not present

## 2021-01-08 DIAGNOSIS — E782 Mixed hyperlipidemia: Secondary | ICD-10-CM | POA: Diagnosis not present

## 2021-01-08 DIAGNOSIS — M8000XD Age-related osteoporosis with current pathological fracture, unspecified site, subsequent encounter for fracture with routine healing: Secondary | ICD-10-CM | POA: Diagnosis not present

## 2021-01-08 DIAGNOSIS — I10 Essential (primary) hypertension: Secondary | ICD-10-CM | POA: Diagnosis not present

## 2021-01-08 DIAGNOSIS — M6281 Muscle weakness (generalized): Secondary | ICD-10-CM | POA: Diagnosis not present

## 2021-01-08 DIAGNOSIS — J9691 Respiratory failure, unspecified with hypoxia: Secondary | ICD-10-CM | POA: Diagnosis not present

## 2021-01-08 DIAGNOSIS — K5190 Ulcerative colitis, unspecified, without complications: Secondary | ICD-10-CM | POA: Diagnosis not present

## 2021-01-09 DIAGNOSIS — M6281 Muscle weakness (generalized): Secondary | ICD-10-CM | POA: Diagnosis not present

## 2021-01-09 DIAGNOSIS — R498 Other voice and resonance disorders: Secondary | ICD-10-CM | POA: Diagnosis not present

## 2021-01-09 DIAGNOSIS — R2681 Unsteadiness on feet: Secondary | ICD-10-CM | POA: Diagnosis not present

## 2021-01-09 DIAGNOSIS — R2689 Other abnormalities of gait and mobility: Secondary | ICD-10-CM | POA: Diagnosis not present

## 2021-01-09 DIAGNOSIS — J9601 Acute respiratory failure with hypoxia: Secondary | ICD-10-CM | POA: Diagnosis not present

## 2021-01-09 DIAGNOSIS — R0602 Shortness of breath: Secondary | ICD-10-CM | POA: Diagnosis not present

## 2021-01-12 DIAGNOSIS — J9600 Acute respiratory failure, unspecified whether with hypoxia or hypercapnia: Secondary | ICD-10-CM | POA: Diagnosis not present

## 2021-01-12 DIAGNOSIS — S81812A Laceration without foreign body, left lower leg, initial encounter: Secondary | ICD-10-CM | POA: Diagnosis not present

## 2021-01-12 DIAGNOSIS — C50912 Malignant neoplasm of unspecified site of left female breast: Secondary | ICD-10-CM | POA: Diagnosis not present

## 2021-01-12 DIAGNOSIS — M8000XD Age-related osteoporosis with current pathological fracture, unspecified site, subsequent encounter for fracture with routine healing: Secondary | ICD-10-CM | POA: Diagnosis not present

## 2021-01-12 DIAGNOSIS — R262 Difficulty in walking, not elsewhere classified: Secondary | ICD-10-CM | POA: Diagnosis not present

## 2021-01-12 DIAGNOSIS — I5189 Other ill-defined heart diseases: Secondary | ICD-10-CM | POA: Diagnosis not present

## 2021-01-12 DIAGNOSIS — E782 Mixed hyperlipidemia: Secondary | ICD-10-CM | POA: Diagnosis not present

## 2021-01-12 DIAGNOSIS — S81811A Laceration without foreign body, right lower leg, initial encounter: Secondary | ICD-10-CM | POA: Diagnosis not present

## 2021-01-12 DIAGNOSIS — I10 Essential (primary) hypertension: Secondary | ICD-10-CM | POA: Diagnosis not present

## 2021-01-12 DIAGNOSIS — I4891 Unspecified atrial fibrillation: Secondary | ICD-10-CM | POA: Diagnosis not present

## 2021-01-12 DIAGNOSIS — M6281 Muscle weakness (generalized): Secondary | ICD-10-CM | POA: Diagnosis not present

## 2021-01-12 DIAGNOSIS — I9589 Other hypotension: Secondary | ICD-10-CM | POA: Diagnosis not present

## 2021-01-12 DIAGNOSIS — K5190 Ulcerative colitis, unspecified, without complications: Secondary | ICD-10-CM | POA: Diagnosis not present

## 2021-01-12 DIAGNOSIS — I509 Heart failure, unspecified: Secondary | ICD-10-CM | POA: Diagnosis not present

## 2021-01-12 DIAGNOSIS — S41112A Laceration without foreign body of left upper arm, initial encounter: Secondary | ICD-10-CM | POA: Diagnosis not present

## 2021-01-12 DIAGNOSIS — I480 Paroxysmal atrial fibrillation: Secondary | ICD-10-CM | POA: Diagnosis not present

## 2021-01-13 DIAGNOSIS — R2689 Other abnormalities of gait and mobility: Secondary | ICD-10-CM | POA: Diagnosis not present

## 2021-01-13 DIAGNOSIS — N390 Urinary tract infection, site not specified: Secondary | ICD-10-CM | POA: Diagnosis not present

## 2021-01-13 DIAGNOSIS — R498 Other voice and resonance disorders: Secondary | ICD-10-CM | POA: Diagnosis not present

## 2021-01-13 DIAGNOSIS — R2681 Unsteadiness on feet: Secondary | ICD-10-CM | POA: Diagnosis not present

## 2021-01-13 DIAGNOSIS — J9601 Acute respiratory failure with hypoxia: Secondary | ICD-10-CM | POA: Diagnosis not present

## 2021-01-13 DIAGNOSIS — I959 Hypotension, unspecified: Secondary | ICD-10-CM | POA: Diagnosis not present

## 2021-01-13 DIAGNOSIS — R0602 Shortness of breath: Secondary | ICD-10-CM | POA: Diagnosis not present

## 2021-01-13 DIAGNOSIS — M6281 Muscle weakness (generalized): Secondary | ICD-10-CM | POA: Diagnosis not present

## 2021-01-13 DIAGNOSIS — I509 Heart failure, unspecified: Secondary | ICD-10-CM | POA: Diagnosis not present

## 2021-01-14 DIAGNOSIS — S41112A Laceration without foreign body of left upper arm, initial encounter: Secondary | ICD-10-CM | POA: Diagnosis not present

## 2021-01-14 DIAGNOSIS — I5032 Chronic diastolic (congestive) heart failure: Secondary | ICD-10-CM | POA: Diagnosis not present

## 2021-01-14 DIAGNOSIS — U071 COVID-19: Secondary | ICD-10-CM | POA: Diagnosis not present

## 2021-01-14 DIAGNOSIS — J9601 Acute respiratory failure with hypoxia: Secondary | ICD-10-CM | POA: Diagnosis not present

## 2021-01-14 DIAGNOSIS — S81812A Laceration without foreign body, left lower leg, initial encounter: Secondary | ICD-10-CM | POA: Diagnosis not present

## 2021-01-16 ENCOUNTER — Emergency Department (HOSPITAL_COMMUNITY): Payer: Medicare Other

## 2021-01-16 ENCOUNTER — Other Ambulatory Visit: Payer: Self-pay

## 2021-01-16 ENCOUNTER — Encounter (HOSPITAL_COMMUNITY): Payer: Self-pay | Admitting: Emergency Medicine

## 2021-01-16 ENCOUNTER — Inpatient Hospital Stay (HOSPITAL_COMMUNITY)
Admission: EM | Admit: 2021-01-16 | Discharge: 2021-01-21 | DRG: 315 | Disposition: A | Discharge status: Skilled Nursing Facility | Payer: Medicare Other | Source: Skilled Nursing Facility | Attending: Internal Medicine | Admitting: Internal Medicine

## 2021-01-16 DIAGNOSIS — Z66 Do not resuscitate: Secondary | ICD-10-CM | POA: Diagnosis present

## 2021-01-16 DIAGNOSIS — S91312A Laceration without foreign body, left foot, initial encounter: Secondary | ICD-10-CM | POA: Diagnosis present

## 2021-01-16 DIAGNOSIS — T380X5A Adverse effect of glucocorticoids and synthetic analogues, initial encounter: Secondary | ICD-10-CM | POA: Diagnosis present

## 2021-01-16 DIAGNOSIS — S41112A Laceration without foreign body of left upper arm, initial encounter: Secondary | ICD-10-CM | POA: Diagnosis present

## 2021-01-16 DIAGNOSIS — Z8249 Family history of ischemic heart disease and other diseases of the circulatory system: Secondary | ICD-10-CM

## 2021-01-16 DIAGNOSIS — R5381 Other malaise: Secondary | ICD-10-CM | POA: Diagnosis present

## 2021-01-16 DIAGNOSIS — R778 Other specified abnormalities of plasma proteins: Secondary | ICD-10-CM | POA: Diagnosis not present

## 2021-01-16 DIAGNOSIS — Z743 Need for continuous supervision: Secondary | ICD-10-CM | POA: Diagnosis not present

## 2021-01-16 DIAGNOSIS — M8580 Other specified disorders of bone density and structure, unspecified site: Secondary | ICD-10-CM | POA: Diagnosis not present

## 2021-01-16 DIAGNOSIS — D638 Anemia in other chronic diseases classified elsewhere: Secondary | ICD-10-CM | POA: Diagnosis not present

## 2021-01-16 DIAGNOSIS — K5190 Ulcerative colitis, unspecified, without complications: Secondary | ICD-10-CM | POA: Diagnosis not present

## 2021-01-16 DIAGNOSIS — E273 Drug-induced adrenocortical insufficiency: Secondary | ICD-10-CM | POA: Diagnosis present

## 2021-01-16 DIAGNOSIS — C50412 Malignant neoplasm of upper-outer quadrant of left female breast: Secondary | ICD-10-CM | POA: Diagnosis present

## 2021-01-16 DIAGNOSIS — X58XXXA Exposure to other specified factors, initial encounter: Secondary | ICD-10-CM | POA: Diagnosis present

## 2021-01-16 DIAGNOSIS — I4819 Other persistent atrial fibrillation: Secondary | ICD-10-CM | POA: Diagnosis not present

## 2021-01-16 DIAGNOSIS — Z951 Presence of aortocoronary bypass graft: Secondary | ICD-10-CM

## 2021-01-16 DIAGNOSIS — Z952 Presence of prosthetic heart valve: Secondary | ICD-10-CM

## 2021-01-16 DIAGNOSIS — I2510 Atherosclerotic heart disease of native coronary artery without angina pectoris: Secondary | ICD-10-CM | POA: Diagnosis not present

## 2021-01-16 DIAGNOSIS — Z79818 Long term (current) use of other agents affecting estrogen receptors and estrogen levels: Secondary | ICD-10-CM

## 2021-01-16 DIAGNOSIS — Z7901 Long term (current) use of anticoagulants: Secondary | ICD-10-CM

## 2021-01-16 DIAGNOSIS — L89619 Pressure ulcer of right heel, unspecified stage: Secondary | ICD-10-CM | POA: Diagnosis not present

## 2021-01-16 DIAGNOSIS — J9611 Chronic respiratory failure with hypoxia: Secondary | ICD-10-CM | POA: Diagnosis present

## 2021-01-16 DIAGNOSIS — I959 Hypotension, unspecified: Secondary | ICD-10-CM | POA: Diagnosis not present

## 2021-01-16 DIAGNOSIS — E785 Hyperlipidemia, unspecified: Secondary | ICD-10-CM | POA: Diagnosis not present

## 2021-01-16 DIAGNOSIS — E872 Acidosis: Secondary | ICD-10-CM | POA: Diagnosis present

## 2021-01-16 DIAGNOSIS — M109 Gout, unspecified: Secondary | ICD-10-CM | POA: Diagnosis not present

## 2021-01-16 DIAGNOSIS — I441 Atrioventricular block, second degree: Secondary | ICD-10-CM | POA: Diagnosis not present

## 2021-01-16 DIAGNOSIS — E2740 Unspecified adrenocortical insufficiency: Secondary | ICD-10-CM | POA: Diagnosis present

## 2021-01-16 DIAGNOSIS — I5032 Chronic diastolic (congestive) heart failure: Secondary | ICD-10-CM | POA: Diagnosis not present

## 2021-01-16 DIAGNOSIS — E878 Other disorders of electrolyte and fluid balance, not elsewhere classified: Secondary | ICD-10-CM | POA: Diagnosis not present

## 2021-01-16 DIAGNOSIS — R0902 Hypoxemia: Secondary | ICD-10-CM | POA: Diagnosis not present

## 2021-01-16 DIAGNOSIS — E876 Hypokalemia: Secondary | ICD-10-CM | POA: Diagnosis not present

## 2021-01-16 DIAGNOSIS — G8929 Other chronic pain: Secondary | ICD-10-CM | POA: Diagnosis not present

## 2021-01-16 DIAGNOSIS — Z803 Family history of malignant neoplasm of breast: Secondary | ICD-10-CM

## 2021-01-16 DIAGNOSIS — Z91013 Allergy to seafood: Secondary | ICD-10-CM

## 2021-01-16 DIAGNOSIS — M720 Palmar fascial fibromatosis [Dupuytren]: Secondary | ICD-10-CM | POA: Diagnosis present

## 2021-01-16 DIAGNOSIS — Z8616 Personal history of COVID-19: Secondary | ICD-10-CM

## 2021-01-16 DIAGNOSIS — J90 Pleural effusion, not elsewhere classified: Secondary | ICD-10-CM | POA: Diagnosis not present

## 2021-01-16 DIAGNOSIS — Z20822 Contact with and (suspected) exposure to covid-19: Secondary | ICD-10-CM | POA: Diagnosis not present

## 2021-01-16 DIAGNOSIS — Z923 Personal history of irradiation: Secondary | ICD-10-CM

## 2021-01-16 DIAGNOSIS — M2550 Pain in unspecified joint: Secondary | ICD-10-CM | POA: Diagnosis not present

## 2021-01-16 DIAGNOSIS — I110 Hypertensive heart disease with heart failure: Secondary | ICD-10-CM | POA: Diagnosis present

## 2021-01-16 DIAGNOSIS — R6889 Other general symptoms and signs: Secondary | ICD-10-CM | POA: Diagnosis not present

## 2021-01-16 DIAGNOSIS — Z7983 Long term (current) use of bisphosphonates: Secondary | ICD-10-CM

## 2021-01-16 DIAGNOSIS — G894 Chronic pain syndrome: Secondary | ICD-10-CM | POA: Diagnosis present

## 2021-01-16 DIAGNOSIS — Z9981 Dependence on supplemental oxygen: Secondary | ICD-10-CM

## 2021-01-16 DIAGNOSIS — R0602 Shortness of breath: Secondary | ICD-10-CM | POA: Diagnosis not present

## 2021-01-16 DIAGNOSIS — I517 Cardiomegaly: Secondary | ICD-10-CM | POA: Diagnosis not present

## 2021-01-16 DIAGNOSIS — Z853 Personal history of malignant neoplasm of breast: Secondary | ICD-10-CM

## 2021-01-16 DIAGNOSIS — Z7401 Bed confinement status: Secondary | ICD-10-CM

## 2021-01-16 DIAGNOSIS — Z881 Allergy status to other antibiotic agents status: Secondary | ICD-10-CM

## 2021-01-16 DIAGNOSIS — Z79899 Other long term (current) drug therapy: Secondary | ICD-10-CM

## 2021-01-16 DIAGNOSIS — I9589 Other hypotension: Secondary | ICD-10-CM | POA: Diagnosis not present

## 2021-01-16 DIAGNOSIS — J9811 Atelectasis: Secondary | ICD-10-CM | POA: Diagnosis not present

## 2021-01-16 DIAGNOSIS — Z87891 Personal history of nicotine dependence: Secondary | ICD-10-CM

## 2021-01-16 DIAGNOSIS — L8990 Pressure ulcer of unspecified site, unspecified stage: Secondary | ICD-10-CM | POA: Diagnosis present

## 2021-01-16 DIAGNOSIS — R410 Disorientation, unspecified: Secondary | ICD-10-CM | POA: Diagnosis not present

## 2021-01-16 LAB — CBC WITH DIFFERENTIAL/PLATELET
Abs Immature Granulocytes: 0.06 10*3/uL (ref 0.00–0.07)
Basophils Absolute: 0.1 10*3/uL (ref 0.0–0.1)
Basophils Relative: 1 %
Eosinophils Absolute: 0.1 10*3/uL (ref 0.0–0.5)
Eosinophils Relative: 2 %
HCT: 35.4 % — ABNORMAL LOW (ref 36.0–46.0)
Hemoglobin: 10.9 g/dL — ABNORMAL LOW (ref 12.0–15.0)
Immature Granulocytes: 1 %
Lymphocytes Relative: 15 %
Lymphs Abs: 1 10*3/uL (ref 0.7–4.0)
MCH: 30.5 pg (ref 26.0–34.0)
MCHC: 30.8 g/dL (ref 30.0–36.0)
MCV: 99.2 fL (ref 80.0–100.0)
Monocytes Absolute: 0.5 10*3/uL (ref 0.1–1.0)
Monocytes Relative: 8 %
Neutro Abs: 4.7 10*3/uL (ref 1.7–7.7)
Neutrophils Relative %: 73 %
Platelets: 176 10*3/uL (ref 150–400)
RBC: 3.57 MIL/uL — ABNORMAL LOW (ref 3.87–5.11)
RDW: 15 % (ref 11.5–15.5)
WBC: 6.4 10*3/uL (ref 4.0–10.5)
nRBC: 0 % (ref 0.0–0.2)

## 2021-01-16 LAB — COMPREHENSIVE METABOLIC PANEL
ALT: 15 U/L (ref 0–44)
AST: 35 U/L (ref 15–41)
Albumin: 2.3 g/dL — ABNORMAL LOW (ref 3.5–5.0)
Alkaline Phosphatase: 81 U/L (ref 38–126)
Anion gap: 10 (ref 5–15)
BUN: 10 mg/dL (ref 8–23)
CO2: 26 mmol/L (ref 22–32)
Calcium: 8.2 mg/dL — ABNORMAL LOW (ref 8.9–10.3)
Chloride: 103 mmol/L (ref 98–111)
Creatinine, Ser: 0.78 mg/dL (ref 0.44–1.00)
GFR, Estimated: >60 mL/min (ref >60)
Glucose, Bld: 92 mg/dL (ref 70–99)
Potassium: 3.6 mmol/L (ref 3.5–5.1)
Sodium: 139 mmol/L (ref 135–145)
Total Bilirubin: 1.1 mg/dL (ref 0.3–1.2)
Total Protein: 5.8 g/dL — ABNORMAL LOW (ref 6.5–8.1)

## 2021-01-16 LAB — TROPONIN I (HIGH SENSITIVITY): Troponin I (High Sensitivity): 31 ng/L — ABNORMAL HIGH (ref <18)

## 2021-01-16 LAB — BRAIN NATRIURETIC PEPTIDE: B Natriuretic Peptide: 229 pg/mL — ABNORMAL HIGH (ref 0.0–100.0)

## 2021-01-16 MED ORDER — SODIUM CHLORIDE 0.9 % IV BOLUS
1000.0000 mL | Freq: Once | INTRAVENOUS | Status: AC
  Administered 2021-01-16: 1000 mL via INTRAVENOUS

## 2021-01-16 MED ORDER — IOHEXOL 350 MG/ML SOLN
100.0000 mL | Freq: Once | INTRAVENOUS | Status: AC | PRN
  Administered 2021-01-16: 100 mL via INTRAVENOUS

## 2021-01-16 NOTE — ED Provider Notes (Signed)
[facility] DEPT Provider Note   CSN: [id number] Arrival date & time: 01/16/21  1746     History Chief Complaint  Patient presents with  . Shortness of Breath    Michele TONKINSON is a 80 y.o. female.  80 yo F with a chief complaints of shortness of breath.  This been going on for the past few days to a week.  She was found by EMS off of her home oxygen.  She tells me that she has been on her oxygen and she is not sure what they are talking about.  Initially hypoxic but on her normal 2 L she is at 100%.  She denies cough congestion or fever.  Denies chest pain or pressure.  Denies nausea vomiting or diarrhea.  Denies abdominal pain.  She has been essentially bedbound since she had Covid at the beginning of January.  Not requiring oxygen prior but requiring oxygen now.  Cannot even sit on her own.  Had developed a wound to her right heel she thinks is improving.  The history is provided by the patient.  Illness Severity:  Moderate Onset quality:  Gradual Duration:  1 week Timing:  Constant Progression:  Worsening Chronicity:  New Associated symptoms: shortness of breath   Associated symptoms: no chest pain, no congestion, no fever, no headaches, no myalgias, no nausea, no rhinorrhea, no vomiting and no wheezing        Past Medical History:  Diagnosis Date  . Arrhythmia   . Atypical atrial flutter ([place]) 08/14/2018   Post-operative  . Breast cancer of upper-outer quadrant of left female breast ([place]) 02/02/2016  . CAD (coronary artery disease) 06/20/2018   LHC 7/19: pLAD 65/90, oD1 90, mLCx 85, OM2 50, oRCA 30, EF 50-55 >> s/p CABG in 9/19 (L-LAD)  . Colon polyp 02/2012  . Dupuytren contracture    bilateral hands  . Family history of breast cancer   . Hyperlipidemia   . Hypertension   . Mitral regurgitation 11/07/2013   Echo 7/19: Mild LVH, EF 60-65, no RWMA, mod ot severe MR, massive LAE, PASP 33 // TEE 7/19:  Mild conc LVH, EF 60-65, no RWMA,  severe MR with mild post leaflet prolapse, massive // s/p MV repair 07/2018  . On continuous oral anticoagulation 08/22/2015   Started on Xarelto 08/12/2015   . Osteopenia   . Persistent atrial fibrillation ([place]) 08/22/2015   Started late August or early September 2016 // s/p Maze procedure 07/2018  . Personal history of radiation therapy   . Radiation Therapy 04/21/16-05/19/16   left breast 47.72 Gy, boosted to 10 Gy  . 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 S/P Maze operation for atrial fibrillation 08/10/2018   Complete bilateral atrial lesion set using bipolar radiofrequency and cryothermy ablation with clipping of LA appendage  . S/P MVR (mitral valve repair) 08/10/2018   Complex valvuloplasty including Gore-tex neochord placement x8, Plication of Lateral Commissure and 63m Sorin Memo 4D Ring Annuloplasty SN# [id number] . Ulcerative colitis ([facility]   . Wears glasses     Patient Active Problem List   Diagnosis Date Noted  . Acute respiratory failure with hypoxia ([facility] 12/03/2020  . Pressure injury of skin 12/02/2020  . Pneumonia due to COVID-19 virus 12/01/2020  . T8 vertebral fracture ([facility] 09/13/2020  . Compression fracture of C-spine ([place] 09/12/2020  . Compression fracture of thoracic spine, non-traumatic ([facility] 09/12/2020  . Goals of care, counseling/discussion   .  Palliative care by specialist   . Spinal fracture of T7 vertebra ([place]) 08/17/2020  . Atrial fibrillation with RVR ([place]) 08/16/2020  . Long term (current) use of anticoagulants 08/28/2018  . Atrial flutter with rapid ventricular response ([place]) 08/14/2018  . 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 S/P Maze operation for atrial fibrillation 08/10/2018  . Chronic diastolic HF (heart failure) ([place]) 06/20/2018  . Osteoporosis 09/29/2017  . Loosening of hardware in spine ([place]) 08/23/2017  . Spinal stenosis of lumbar region 08/11/2016  . Unspecified cord compression ([place]) 08/11/2016  . Unstable burst  fracture of third lumbar vertebra ([place]) 08/11/2016  . Genetic testing 03/17/2016  . Family history of breast cancer   . Breast cancer of upper-outer quadrant of left female breast ([place]) 02/02/2016  . On amiodarone therapy 09/09/2015  . On continuous oral anticoagulation 08/22/2015  . Persistent atrial fibrillation ([place]) 08/22/2015  . Essential hypertension 11/07/2013  . Mitral regurgitation 11/07/2013    Past Surgical History:  Procedure Laterality Date  . BIOPSY  07/14/2020   Procedure: BIOPSY;  Surgeon: Ronnette Juniper, MD;  Location: [facility];  Service: Gastroenterology;;  . BREAST BIOPSY Left 01/28/2016  . BREAST LUMPECTOMY Left 02/23/2016  . BREAST LUMPECTOMY WITH RADIOACTIVE SEED LOCALIZATION Left 02/23/2016   Procedure: BREAST LUMPECTOMY WITH RADIOACTIVE SEED LOCALIZATION;  Surgeon: [name], MD;  Location: [facility];  Service: General;  Laterality: Left;  . CARDIAC CATHETERIZATION    . CARDIOVERSION N/A 10/02/2015   Procedure: CARDIOVERSION;  Surgeon: Jerline Pain, MD;  Location: [facility] ENDOSCOPY;  Service: Cardiovascular;  Laterality: N/A;  . CARDIOVERSION N/A 10/05/2018   Procedure: CARDIOVERSION;  Surgeon: Fay Records, MD;  Location: [facility] ENDOSCOPY;  Service: Cardiovascular;  Laterality: N/A;  . CARDIOVERSION N/A 11/05/2019   Procedure: CARDIOVERSION;  Surgeon: Dorothy Spark, MD;  Location: [facility];  Service: Cardiovascular;  Laterality: N/A;  . CLIPPING OF ATRIAL APPENDAGE  08/10/2018   Procedure: CLIPPING OF ATRIAL APPENDAGE;  Surgeon: Rexene Alberts, MD;  Location: [facility];  Service: Open Heart Surgery;;  . COLONOSCOPY    . COLONOSCOPY WITH PROPOFOL N/A 07/14/2020   Procedure: COLONOSCOPY WITH PROPOFOL;  Surgeon: Ronnette Juniper, MD;  Location: [facility];  Service: Gastroenterology;  Laterality: N/A;  . CORONARY ARTERY BYPASS GRAFT N/A 08/10/2018   Procedure: CORONARY ARTERY BYPASS GRAFTING (CABG) x 1, LIMA-LAD,  USING LEFT INTERNAL MAMMARY  ARTERY. HARVESTED RIGHT GREATER SAPHENOUS VEIN ENDOSCOPICALLY;  Surgeon: Rexene Alberts, MD;  Location: [facility];  Service: Open Heart Surgery;  Laterality: N/A;  . CYSTOSCOPY W/ RETROGRADES Bilateral 06/20/2019   Procedure: CYSTOSCOPY WITH RETROGRADE PYELOGRAM;  Surgeon: Ardis Hughs, MD;  Location: [facility];  Service: Urology;  Laterality: Bilateral;  . CYSTOSCOPY WITH HYDRODISTENSION AND BIOPSY N/A 06/20/2019   Procedure: CYSTOSCOPY BLADDER  BIOPSY WITH FULGERATION;  Surgeon: Ardis Hughs, MD;  [facility];  Service: Urology;  Laterality: N/A;  . DUPUYTREN CONTRACTURE RELEASE  2001   leftx2  . [place]   right  . DUPUYTREN CONTRACTURE RELEASE Right 05/02/2014   Procedure: EXCISION DUPUYTRENS RIGHT PALMAR/SMALL ;  Surgeon: Cammie Sickle, MD;  Location: [facility];  Service: Orthopedics;  Laterality: Right;  . HEMOSTASIS CLIP PLACEMENT  07/14/2020   Procedure: HEMOSTASIS CLIP PLACEMENT;  Surgeon: Ronnette Juniper, MD;  Location: [facility];  Service: Gastroenterology;;  . IR VERTEBROPLASTY CERV/THOR BX INC UNI/BIL INC/INJECT/IMAGING  08/19/2020  .  IR VERTEBROPLASTY CERV/THOR BX INC UNI/BIL INC/INJECT/IMAGING  09/15/2020  . MAZE N/A 08/10/2018   Procedure: MAZE;  Surgeon: Rexene Alberts, MD;  Location: [facility];  Service: Open Heart Surgery;  Laterality: N/A;  . MITRAL VALVE REPAIR N/A 08/10/2018   Procedure: MITRAL VALVE REPAIR (MVR);  Surgeon: Rexene Alberts, MD;  Location: [facility];  Service: Open Heart Surgery;  Laterality: N/A;  glutaraldehyde  . POLYPECTOMY  07/14/2020   Procedure: POLYPECTOMY;  Surgeon: Ronnette Juniper, MD;  Location: [facility];  Service: Gastroenterology;;  . RIGHT/LEFT HEART CATH AND CORONARY ANGIOGRAPHY N/A 06/20/2018   Procedure: RIGHT/LEFT HEART CATH AND CORONARY ANGIOGRAPHY;  Surgeon: [name], MD;  Location: [facility] CV LAB;  Service: Cardiovascular;  Laterality: N/A;   . TEE WITHOUT CARDIOVERSION N/A 06/20/2018   Procedure: TRANSESOPHAGEAL ECHOCARDIOGRAM (TEE);  Surgeon: Pixie Casino, MD;  Location: [facility] ENDOSCOPY;  Service: Cardiovascular;  Laterality: N/A;  . TEE WITHOUT CARDIOVERSION N/A 08/10/2018   Procedure: TRANSESOPHAGEAL ECHOCARDIOGRAM (TEE);  Surgeon: Rexene Alberts, MD;  Location: [facility];  Service: Open Heart Surgery;  Laterality: N/A;  . TONSILLECTOMY  age 70     OB History    Gravida  1   Para  1   Term  1   Preterm      AB      Living  1     SAB      IAB      Ectopic      Multiple      Live Births              Family History  Problem Relation Age of Onset  . Cirrhosis Mother   . Breast cancer Mother 43  . Heart failure Father   . Heart attack Father   . Breast cancer Sister        early 69's  . Kidney Stones Sister        loss of kidney due to stones  . Osteoporosis Neg Hx     Social History   Tobacco Use  . Smoking status: Former Smoker    Packs/day: 1.00    Years: 20.00    Pack years: 20.00    Types: Cigarettes    Quit date: 11/22/1992    Years since quitting: 28.1  . Smokeless tobacco: Never Used  Vaping Use  . Vaping Use: Never used  Substance Use Topics  . Alcohol use: Yes    Alcohol/week: 3.0 standard drinks    Types: 3 Standard drinks or equivalent per week    Comment: social  . Drug use: No    Home Medications Prior to Admission medications   Medication Sig Start Date End Date Taking? Authorizing Provider  acetaminophen (TYLENOL) 500 MG tablet Take 500-1,000 mg by mouth every 6 (six) hours as needed for moderate pain or headache.    Yes [provider]  alendronate (FOSAMAX) 70 MG tablet Take 70 mg by mouth once a week. 09/24/20  Yes [provider]  allopurinol (ZYLOPRIM) 100 MG tablet Take 200 mg by mouth every evening.  06/21/13  Yes [provider]  atorvastatin (LIPITOR) 20 MG tablet TAKE 1 TABLET BY MOUTH EVERY DAY Patient taking differently: Take 20 mg  by mouth daily. 04/07/20  Yes Camnitz, Will Hassell Done, MD  bisacodyl (DULCOLAX) 10 MG suppository Place 1 suppository (10 mg total) rectally daily as needed for moderate constipation. 09/19/20  Yes Oswald Hillock, MD  cephALEXin (KEFLEX) 500 MG capsule Take 500  mg by mouth 2 (two) times daily. 01/13/21  Yes [provider]  digoxin (LANOXIN) 0.125 MG tablet TAKE ONE TABLET BY MOUTH DAILY MONDAY THROUGH FRIDAY. DO NOT TAKE ON SATURDAY OR SUNDAY. Patient taking differently: Take 0.125 mg by mouth See admin instructions. Take one tablet by mouth daily Monday through Friday.  Do NOT take on Saturday or Sunday. 10/28/20  Yes [name], MD  diltiazem (CARDIZEM CD) 180 MG 24 hr capsule Take 180 mg by mouth daily. 11/05/20  Yes [provider]  fentaNYL (DURAGESIC) 12 MCG/HR Place 1 patch onto the skin every 3 (three) days. 12/09/20  Yes Charlynne Cousins, MD  hydrOXYzine (ATARAX/VISTARIL) 25 MG tablet Take 25 mg by mouth daily. 01/14/21  Yes [provider]  mesalamine (APRISO) 0.375 g 24 hr capsule Take 0.375 g by mouth daily. 06/01/19  Yes [provider]  metoprolol succinate (TOPROL-XL) 25 MG 24 hr tablet Take 25 mg by mouth daily. 01/14/21  Yes [provider]  metoprolol tartrate (LOPRESSOR) 25 MG tablet Take 1 tablet (25 mg total) by mouth 2 (two) times daily. 12/11/20  Yes Mariel Aloe, MD  tamoxifen (NOLVADEX) 20 MG tablet Take 20 mg by mouth daily.   Yes [provider]  XARELTO 15 MG TABS tablet TAKE 1 TABLET BY MOUTH  DAILY WITH SUPPER Patient taking differently: Take 15 mg by mouth daily. 10/06/20  Yes [name], MD  cyclobenzaprine (FLEXERIL) 5 MG tablet Take 1 tablet (5 mg total) by mouth 3 (three) times daily as needed for muscle spasms. 09/19/20   Oswald Hillock, MD  docusate sodium (COLACE) 100 MG capsule Take 1 capsule (100 mg total) by mouth 2 (two) times daily. 09/19/20   Oswald Hillock, MD  feeding supplement, ENSURE ENLIVE,  (ENSURE ENLIVE) LIQD Take 237 mLs by mouth 2 (two) times daily between meals. 08/23/20   Debbe Odea, MD  Multiple Vitamin (MULTIVITAMIN WITH MINERALS) TABS tablet Take 1 tablet by mouth daily. 08/24/20   Debbe Odea, MD  oxyCODONE (OXY IR/ROXICODONE) 5 MG immediate release tablet Take 1 tablet (5 mg total) by mouth every 6 (six) hours as needed for severe pain. 12/09/20   Charlynne Cousins, MD    Allergies    Shellfish allergy and Amoxicillin-pot clavulanate  Review of Systems   Review of Systems  Constitutional: Negative for chills and fever.  HENT: Negative for congestion and rhinorrhea.   Eyes: Negative for redness and visual disturbance.  Respiratory: Positive for shortness of breath. Negative for wheezing.   Cardiovascular: Negative for chest pain and palpitations.  Gastrointestinal: Negative for nausea and vomiting.  Genitourinary: Negative for dysuria and urgency.  Musculoskeletal: Negative for arthralgias and myalgias.  Skin: Positive for wound. Negative for pallor.  Neurological: Negative for dizziness and headaches.    Physical Exam Updated Vital Signs BP (!) 92/51   Pulse 81   Temp 98.8 F (37.1 C) (Rectal)   Resp (!) 24   Ht 5' 4"  (1.626 m)   Wt 51.7 kg   SpO2 97%   BMI 19.57 kg/m   Physical Exam Vitals and nursing note reviewed.  Constitutional:      General: She is not in acute distress.    Appearance: She is well-developed and well-nourished. She is not diaphoretic.  HENT:     Head: Normocephalic and atraumatic.  Eyes:     Extraocular Movements: EOM normal.     Pupils: Pupils are equal, round, and reactive to light.  Cardiovascular:     Rate and Rhythm: Normal rate and regular rhythm.     Heart sounds: No murmur heard. No friction rub. No gallop.   Pulmonary:     Effort: Pulmonary effort is normal.     Breath sounds: No wheezing or rales.  Abdominal:     General: There is no distension.     Palpations: Abdomen is soft.     Tenderness: There  is no abdominal tenderness.  Musculoskeletal:        General: No tenderness or edema.     Cervical back: Normal range of motion and neck supple.  Skin:    General: Skin is warm and dry.  Neurological:     Mental Status: She is alert and oriented to person, place, and time.  Psychiatric:        Mood and Affect: Mood and affect normal.        Behavior: Behavior normal.     ED Results / Procedures / Treatments   Labs (all labs ordered are listed, but only abnormal results are displayed) Labs Reviewed  CBC WITH DIFFERENTIAL/PLATELET - Abnormal; Notable for the following components:      Result Value   RBC 3.57 (*)    Hemoglobin 10.9 (*)    HCT 35.4 (*)    All other components within normal limits  COMPREHENSIVE METABOLIC PANEL - Abnormal; Notable for the following components:   Calcium 8.2 (*)    Total Protein 5.8 (*)    Albumin 2.3 (*)    All other components within normal limits  BRAIN NATRIURETIC PEPTIDE - Abnormal; Notable for the following components:   B Natriuretic Peptide 229.0 (*)    All other components within normal limits  TROPONIN I (HIGH SENSITIVITY) - Abnormal; Notable for the following components:   Troponin I (High Sensitivity) 31 (*)    All other components within normal limits    EKG None  Radiology CT Angio Chest PE W and/or Wo Contrast  Result Date: 01/16/2021 CLINICAL DATA:  Shortness of breath EXAM: CT ANGIOGRAPHY CHEST WITH CONTRAST TECHNIQUE: Multidetector CT imaging of the chest was performed using the standard protocol during bolus administration of intravenous contrast. Multiplanar CT image reconstructions and MIPs were obtained to evaluate the vascular anatomy. CONTRAST:  161m OMNIPAQUE IOHEXOL 350 MG/ML SOLN COMPARISON:  None. FINDINGS: Cardiovascular: There is a optimal opacification of the pulmonary arteries. There is no central,segmental, or subsegmental filling defects within the pulmonary arteries. There is moderate cardiomegaly present. No  pericardial effusion or thickening. No evidence right heart strain. There is normal three-vessel brachiocephalic anatomy without proximal stenosis. Scattered aortic atherosclerosis is seen. Mediastinum/Nodes: No hilar, mediastinal, or axillary adenopathy. Thyroid gland, trachea, and esophagus demonstrate no significant findings. Lungs/Pleura: Small bilateral pleural effusions are present, right greater than left. Interlobular septal thickening is noted. There is ground-glass streaky airspace opacity seen predominantly at both lung bases. No pneumothorax is noted. Upper Abdomen: No acute abnormalities present in the visualized portions of the upper abdomen. Contrast reflux seen within the IVC. Musculoskeletal: No chest wall abnormality. No acute or significant osseous findings. Overlying median sternotomy wires are present. Review of the MIP images confirms the above findings. IMPRESSION: 1. No central, segmental, or subsegmental pulmonary embolism 2. Small bilateral pleural effusions, right greater than left 3. Findings suggestive of interstitial edema 4. Ground-glass patchy airspace opacities at both lung bases which could be due to inflammatory etiology or atelectasis. 5.  Aortic Atherosclerosis (ICD10-I70.0). Electronically Signed   By: BKerby Moors  Avutu M.D.   On: 01/16/2021 22:13   DG Chest Port 1 View  Result Date: 01/16/2021 CLINICAL DATA:  Shortness of breath. EXAM: PORTABLE CHEST 1 VIEW COMPARISON:  December 01, 2020. FINDINGS: Stable cardiomediastinal silhouette. No pneumothorax is noted. Status post cardiac valve repair. Mild left basilar atelectasis, infiltrate or scarring is noted with probable small loculated left pleural effusion. Minimal right basilar subsegmental atelectasis is noted with small pleural effusion. Bony thorax is unremarkable. IMPRESSION: Mild left basilar atelectasis, infiltrate or scarring is noted with probable small loculated left pleural effusion. Minimal right basilar subsegmental  atelectasis is noted with small pleural effusion. Aortic Atherosclerosis (ICD10-I70.0). Electronically Signed   By: Marijo Conception M.D.   On: 01/16/2021 18:25    Procedures Procedures   Medications Ordered in ED Medications  iohexol (OMNIPAQUE) 350 MG/ML injection 100 mL (100 mLs Intravenous Contrast Given 01/16/21 2141)  sodium chloride 0.9 % bolus 1,000 mL (1,000 mLs Intravenous New Bag/Given 01/16/21 2229)    ED Course  I have reviewed the triage vital signs and the nursing notes.  Pertinent labs & imaging results that were available during my care of the patient were reviewed by me and considered in my medical decision making (see chart for details).    MDM Rules/Calculators/A&P                          80 yo F with a cc of sob.  Going on for the past few days to a week.  Not endorsing any other symptoms.  On xarelto, makes PE less likely.   Chest x-ray with concern for loculated left lower pleural effusion.  Lab work is largely unremarkable.  Troponin is less than her baseline.  Without chest pain or pressure feel no reason to repeat.  Will obtain a CT angiogram of the chest.  CT scan has returned with likely interstitial edema has the read though the patient appears clinically dehydrated.  Her blood pressures here have been somewhat soft we will give a bolus of IV fluids and reassess.  I asked the patient if she was symptomatic from this and she tells me she has been lightheaded since she contracted the coronavirus a couple months ago.  Signed out to Dr. Leonette Monarch, please see his note for further details of care in the ED.   The patients results and plan were reviewed and discussed.   Any x-rays performed were independently reviewed by myself.   Differential diagnosis were considered with the presenting HPI.  Medications  iohexol (OMNIPAQUE) 350 MG/ML injection 100 mL (100 mLs Intravenous Contrast Given 01/16/21 2141)  sodium chloride 0.9 % bolus 1,000 mL (1,000 mLs Intravenous  New Bag/Given 01/16/21 2229)    Vitals:   01/16/21 2050 01/16/21 2212 01/16/21 2222 01/16/21 2248  BP: 90/70 (!) 82/59 (!) 93/48 (!) 92/51  Pulse:  78 (!) 113 81  Resp:  (!) 22 (!) 23 (!) 24  Temp:      TempSrc:      SpO2:  97% 97% 97%  Weight:      Height:        Final diagnoses:  Shortness of breath    Admission/ observation were discussed with the admitting physician, patient and/or family and they are comfortable with the plan.     Final Clinical Impression(s) / ED Diagnoses Final diagnoses:  Shortness of breath    Rx / DC Orders ED Discharge Orders    None  Deno Etienne, DO 01/16/21 2328

## 2021-01-16 NOTE — ED Triage Notes (Signed)
Patient is from [facility] and presents with SOB for 2 days. Though she is a chronic O2 user (2L), when EMS arrived the patient was on room air. SPO2 at that time was 77%. Once the patient was placed back on 2L of O2, her sats were at 100%. HX AFib    EMS vitals: 97/56 BP 96-110 HR 18 RR 100% SPO2 on 2L nasal cannula

## 2021-01-17 ENCOUNTER — Encounter (HOSPITAL_COMMUNITY): Payer: Self-pay | Admitting: Family Medicine

## 2021-01-17 ENCOUNTER — Inpatient Hospital Stay (HOSPITAL_COMMUNITY): Payer: Medicare Other

## 2021-01-17 DIAGNOSIS — L89619 Pressure ulcer of right heel, unspecified stage: Secondary | ICD-10-CM

## 2021-01-17 DIAGNOSIS — S91312A Laceration without foreign body, left foot, initial encounter: Secondary | ICD-10-CM | POA: Diagnosis present

## 2021-01-17 DIAGNOSIS — G8929 Other chronic pain: Secondary | ICD-10-CM

## 2021-01-17 DIAGNOSIS — Z7401 Bed confinement status: Secondary | ICD-10-CM | POA: Diagnosis not present

## 2021-01-17 DIAGNOSIS — S41112A Laceration without foreign body of left upper arm, initial encounter: Secondary | ICD-10-CM | POA: Diagnosis present

## 2021-01-17 DIAGNOSIS — D638 Anemia in other chronic diseases classified elsewhere: Secondary | ICD-10-CM | POA: Diagnosis present

## 2021-01-17 DIAGNOSIS — R5381 Other malaise: Secondary | ICD-10-CM

## 2021-01-17 DIAGNOSIS — J9611 Chronic respiratory failure with hypoxia: Secondary | ICD-10-CM

## 2021-01-17 DIAGNOSIS — I2510 Atherosclerotic heart disease of native coronary artery without angina pectoris: Secondary | ICD-10-CM | POA: Diagnosis present

## 2021-01-17 DIAGNOSIS — I4819 Other persistent atrial fibrillation: Secondary | ICD-10-CM

## 2021-01-17 DIAGNOSIS — E785 Hyperlipidemia, unspecified: Secondary | ICD-10-CM | POA: Diagnosis present

## 2021-01-17 DIAGNOSIS — E878 Other disorders of electrolyte and fluid balance, not elsewhere classified: Secondary | ICD-10-CM | POA: Diagnosis present

## 2021-01-17 DIAGNOSIS — X58XXXA Exposure to other specified factors, initial encounter: Secondary | ICD-10-CM | POA: Diagnosis present

## 2021-01-17 DIAGNOSIS — E876 Hypokalemia: Secondary | ICD-10-CM | POA: Diagnosis not present

## 2021-01-17 DIAGNOSIS — E273 Drug-induced adrenocortical insufficiency: Secondary | ICD-10-CM | POA: Diagnosis present

## 2021-01-17 DIAGNOSIS — T380X5A Adverse effect of glucocorticoids and synthetic analogues, initial encounter: Secondary | ICD-10-CM | POA: Diagnosis present

## 2021-01-17 DIAGNOSIS — R0602 Shortness of breath: Secondary | ICD-10-CM | POA: Diagnosis present

## 2021-01-17 DIAGNOSIS — I110 Hypertensive heart disease with heart failure: Secondary | ICD-10-CM | POA: Diagnosis present

## 2021-01-17 DIAGNOSIS — I959 Hypotension, unspecified: Principal | ICD-10-CM

## 2021-01-17 DIAGNOSIS — I9589 Other hypotension: Secondary | ICD-10-CM | POA: Diagnosis not present

## 2021-01-17 DIAGNOSIS — E872 Acidosis: Secondary | ICD-10-CM | POA: Diagnosis present

## 2021-01-17 DIAGNOSIS — I5032 Chronic diastolic (congestive) heart failure: Secondary | ICD-10-CM | POA: Diagnosis not present

## 2021-01-17 DIAGNOSIS — I441 Atrioventricular block, second degree: Secondary | ICD-10-CM

## 2021-01-17 DIAGNOSIS — Z20822 Contact with and (suspected) exposure to covid-19: Secondary | ICD-10-CM | POA: Diagnosis present

## 2021-01-17 DIAGNOSIS — Z66 Do not resuscitate: Secondary | ICD-10-CM | POA: Diagnosis present

## 2021-01-17 DIAGNOSIS — M8580 Other specified disorders of bone density and structure, unspecified site: Secondary | ICD-10-CM | POA: Diagnosis present

## 2021-01-17 DIAGNOSIS — R7989 Other specified abnormal findings of blood chemistry: Secondary | ICD-10-CM | POA: Diagnosis present

## 2021-01-17 DIAGNOSIS — G894 Chronic pain syndrome: Secondary | ICD-10-CM | POA: Diagnosis present

## 2021-01-17 DIAGNOSIS — R778 Other specified abnormalities of plasma proteins: Secondary | ICD-10-CM

## 2021-01-17 DIAGNOSIS — K5190 Ulcerative colitis, unspecified, without complications: Secondary | ICD-10-CM

## 2021-01-17 DIAGNOSIS — M109 Gout, unspecified: Secondary | ICD-10-CM | POA: Diagnosis present

## 2021-01-17 LAB — CBC
HCT: 28.7 % — ABNORMAL LOW (ref 36.0–46.0)
Hemoglobin: 8.8 g/dL — ABNORMAL LOW (ref 12.0–15.0)
MCH: 31.2 pg (ref 26.0–34.0)
MCHC: 30.7 g/dL (ref 30.0–36.0)
MCV: 101.8 fL — ABNORMAL HIGH (ref 80.0–100.0)
Platelets: 145 10*3/uL — ABNORMAL LOW (ref 150–400)
RBC: 2.82 MIL/uL — ABNORMAL LOW (ref 3.87–5.11)
RDW: 15 % (ref 11.5–15.5)
WBC: 6.2 10*3/uL (ref 4.0–10.5)
nRBC: 0 % (ref 0.0–0.2)

## 2021-01-17 LAB — BASIC METABOLIC PANEL
Anion gap: 7 (ref 5–15)
BUN: 8 mg/dL (ref 8–23)
CO2: 22 mmol/L (ref 22–32)
Calcium: 6.8 mg/dL — ABNORMAL LOW (ref 8.9–10.3)
Chloride: 110 mmol/L (ref 98–111)
Creatinine, Ser: 0.59 mg/dL (ref 0.44–1.00)
GFR, Estimated: >60 mL/min (ref >60)
Glucose, Bld: 79 mg/dL (ref 70–99)
Potassium: 4 mmol/L (ref 3.5–5.1)
Sodium: 139 mmol/L (ref 135–145)

## 2021-01-17 LAB — DIGOXIN LEVEL: Digoxin Level: 2.3 ng/mL — ABNORMAL HIGH (ref 0.8–2.0)

## 2021-01-17 LAB — ECHOCARDIOGRAM COMPLETE
Area-P 1/2: 3.01 cm2
Height: 64 in
MV VTI: 0.83 cm2
S' Lateral: 2.6 cm
Weight: 2024.7 oz

## 2021-01-17 LAB — TSH: TSH: 1.974 u[IU]/mL (ref 0.350–4.500)

## 2021-01-17 LAB — SARS CORONAVIRUS 2 (TAT 6-24 HRS): SARS Coronavirus 2: NEGATIVE

## 2021-01-17 LAB — MAGNESIUM: Magnesium: 1.5 mg/dL — ABNORMAL LOW (ref 1.7–2.4)

## 2021-01-17 LAB — CORTISOL: Cortisol, Plasma: 11.7 ug/dL

## 2021-01-17 LAB — TROPONIN I (HIGH SENSITIVITY): Troponin I (High Sensitivity): 30 ng/L — ABNORMAL HIGH (ref <18)

## 2021-01-17 LAB — MRSA PCR SCREENING: MRSA by PCR: POSITIVE — AB

## 2021-01-17 MED ORDER — CHLORHEXIDINE GLUCONATE CLOTH 2 % EX PADS
6.0000 | MEDICATED_PAD | Freq: Every day | CUTANEOUS | Status: DC
  Administered 2021-01-18 – 2021-01-21 (×5): 6 via TOPICAL

## 2021-01-17 MED ORDER — SODIUM CHLORIDE 0.9 % IV SOLN: 1000.0000 mL | INTRAVENOUS | Status: DC

## 2021-01-17 MED ORDER — MUPIROCIN 2 % EX OINT
1.0000 "application " | TOPICAL_OINTMENT | Freq: Two times a day (BID) | CUTANEOUS | Status: DC
  Administered 2021-01-17 – 2021-01-21 (×9): 1 via NASAL
  Filled 2021-01-17 (×2): qty 22

## 2021-01-17 MED ORDER — SODIUM CHLORIDE 0.9 % IV BOLUS
500.0000 mL | Freq: Once | INTRAVENOUS | Status: AC
  Administered 2021-01-17: 500 mL via INTRAVENOUS

## 2021-01-17 MED ORDER — DOCUSATE SODIUM 100 MG PO CAPS
100.0000 mg | ORAL_CAPSULE | Freq: Two times a day (BID) | ORAL | Status: DC
  Administered 2021-01-17 – 2021-01-20 (×7): 100 mg via ORAL
  Filled 2021-01-17 (×9): qty 1

## 2021-01-17 MED ORDER — SODIUM CHLORIDE 0.9 % IV BOLUS (SEPSIS)
1000.0000 mL | Freq: Once | INTRAVENOUS | Status: AC
  Administered 2021-01-17: 1000 mL via INTRAVENOUS

## 2021-01-17 MED ORDER — POLYETHYLENE GLYCOL 3350 17 G PO PACK: 17.0000 g | PACK | Freq: Every day | ORAL | Status: DC | PRN

## 2021-01-17 MED ORDER — SODIUM CHLORIDE 0.9 % IV SOLN: INTRAVENOUS | Status: DC | PRN

## 2021-01-17 MED ORDER — TAMOXIFEN CITRATE 10 MG PO TABS
20.0000 mg | ORAL_TABLET | Freq: Every day | ORAL | Status: DC
  Administered 2021-01-17 – 2021-01-21 (×5): 20 mg via ORAL
  Filled 2021-01-17 (×5): qty 2

## 2021-01-17 MED ORDER — ACETAMINOPHEN 650 MG RE SUPP: 650.0000 mg | Freq: Four times a day (QID) | RECTAL | Status: DC | PRN

## 2021-01-17 MED ORDER — ENSURE ENLIVE PO LIQD
237.0000 mL | Freq: Two times a day (BID) | ORAL | Status: DC
  Administered 2021-01-17 – 2021-01-21 (×4): 237 mL via ORAL

## 2021-01-17 MED ORDER — ALLOPURINOL 100 MG PO TABS
200.0000 mg | ORAL_TABLET | Freq: Every evening | ORAL | Status: DC
  Administered 2021-01-17 – 2021-01-20 (×4): 200 mg via ORAL
  Filled 2021-01-17 (×4): qty 2

## 2021-01-17 MED ORDER — MAGNESIUM SULFATE 2 GM/50ML IV SOLN
2.0000 g | Freq: Once | INTRAVENOUS | Status: AC
  Administered 2021-01-17: 2 g via INTRAVENOUS
  Filled 2021-01-17: qty 50

## 2021-01-17 MED ORDER — THIAMINE HCL 100 MG PO TABS
100.0000 mg | ORAL_TABLET | Freq: Two times a day (BID) | ORAL | Status: DC
  Administered 2021-01-17 – 2021-01-21 (×9): 100 mg via ORAL
  Filled 2021-01-17 (×9): qty 1

## 2021-01-17 MED ORDER — HYDROCORTISONE NA SUCCINATE PF 100 MG IJ SOLR
100.0000 mg | Freq: Three times a day (TID) | INTRAMUSCULAR | Status: DC
  Administered 2021-01-17 – 2021-01-18 (×3): 100 mg via INTRAVENOUS
  Filled 2021-01-17 (×3): qty 2

## 2021-01-17 MED ORDER — MESALAMINE ER 0.375 G PO CP24: 0.3750 g | ORAL_CAPSULE | Freq: Every day | ORAL | Status: DC

## 2021-01-17 MED ORDER — MIDODRINE HCL 5 MG PO TABS
5.0000 mg | ORAL_TABLET | Freq: Three times a day (TID) | ORAL | Status: DC
  Administered 2021-01-17 – 2021-01-18 (×3): 5 mg via ORAL
  Filled 2021-01-17 (×3): qty 1

## 2021-01-17 MED ORDER — ADULT MULTIVITAMIN W/MINERALS CH
1.0000 | ORAL_TABLET | Freq: Every day | ORAL | Status: DC
  Administered 2021-01-17 – 2021-01-21 (×5): 1 via ORAL
  Filled 2021-01-17 (×5): qty 1

## 2021-01-17 MED ORDER — ACETAMINOPHEN 325 MG PO TABS
650.0000 mg | ORAL_TABLET | Freq: Four times a day (QID) | ORAL | Status: DC | PRN
  Administered 2021-01-17 – 2021-01-18 (×2): 650 mg via ORAL
  Filled 2021-01-17 (×2): qty 2

## 2021-01-17 MED ORDER — ONDANSETRON HCL 4 MG/2ML IJ SOLN: 4.0000 mg | Freq: Four times a day (QID) | INTRAMUSCULAR | Status: DC | PRN

## 2021-01-17 MED ORDER — ATORVASTATIN CALCIUM 20 MG PO TABS
20.0000 mg | ORAL_TABLET | Freq: Every day | ORAL | Status: DC
  Administered 2021-01-17 – 2021-01-21 (×5): 20 mg via ORAL
  Filled 2021-01-17 (×2): qty 1
  Filled 2021-01-17 (×2): qty 2
  Filled 2021-01-17: qty 1

## 2021-01-17 MED ORDER — ONDANSETRON HCL 4 MG PO TABS: 4.0000 mg | ORAL_TABLET | Freq: Four times a day (QID) | ORAL | Status: DC | PRN

## 2021-01-17 MED ORDER — RIVAROXABAN 15 MG PO TABS
15.0000 mg | ORAL_TABLET | Freq: Every day | ORAL | Status: DC
  Administered 2021-01-17 – 2021-01-21 (×5): 15 mg via ORAL
  Filled 2021-01-17 (×5): qty 1

## 2021-01-17 MED ORDER — SODIUM CHLORIDE 0.9 % IV SOLN: INTRAVENOUS | Status: DC

## 2021-01-17 NOTE — Progress Notes (Signed)
  Echocardiogram 2D Echocardiogram has been performed.  Michele Green 01/17/2021, 9:39 AM

## 2021-01-17 NOTE — Progress Notes (Addendum)
Date and time results received: 01/17/21 @ 0950   Test: MRSA PCR   Critical Value: positive   Name of Provider Notified: none initiated MRSA positive standing orders    Orders Received: yes MRSA PCR  protocol followed

## 2021-01-17 NOTE — Progress Notes (Signed)
Patient noted to have only 100 ml of urinary output since arriving on unit.  Bladder scan performed and showed  126 ml. While doing bladder scan patient voided in bed was unable to be measured.

## 2021-01-17 NOTE — Progress Notes (Signed)
Primary RN, Maudie Mercury obtained MD orders to place PIV on restricted arm (left) d/t severe redness, swelling and poor skin turgo on the right side.

## 2021-01-17 NOTE — H&P (Signed)
History and Physical    Michele Green IFO:[id number] DOB: December 05, 1940 DOA: 01/16/2021  PCP: [name], MD   Patient coming from: ALF   Chief Complaint: SOB, lightheadedness when sitting up   HPI: Michele Green is a 80 y.o. female with medical history significant for atrial fibrillation on Xarelto, history of breast cancer, CAD, history of Maze procedure and mitral valve repair, chronic pain, ulcerative colitis, and 2 L/min supplemental oxygen requirement, now presenting to the emergency department for evaluation of shortness of breath and lightheadedness when sitting up.  Patient was hospitalized with COVID-19 7 weeks ago, was discharged to an SNF on 12/11/2020 but is now back at ALF and reports that she has been bedbound ever since the hospital discharge and has now been able to even sit up in bed without assistance.  She has been requiring 2 L/min of supplemental oxygen since the hospital discharge but felt more short of breath over the past few days and has also been lightheaded when sitting up.  She denies any chest pain, denies fevers or chills, denies abdominal pain, dysuria, or flank pain.  Reports that she was told she had a UTI a few days ago and was given Keflex but notes that she was surprised to hear this as she did not have any symptoms.  ED Course: Upon arrival to the ED, patient is found to be afebrile, saturating mid to upper 90s on 2 L/min supplemental oxygen, tachycardic in the 676H, and with systolic blood pressure 20-947.  EKG features second-degree AV nodal block Mobitz type II.  Chemistry panel with albumin 2.3 and total protein 5.8.  CBC was stable normocytic anemia.  High-sensitivity troponin is 31 and BNP 229.  CTA chest is negative for PE but notable for small bilateral pleural effusions, suspected interstitial edema, and bibasilar groundglass opacities that could reflect atelectasis or inflammation.  Patient was given 3 L of saline in the ED.  Review of Systems:   All other systems reviewed and apart from HPI, are negative.  Past Medical History:  Diagnosis Date  . Arrhythmia   . Atypical atrial flutter ([place]) 08/14/2018   Post-operative  . Breast cancer of upper-outer quadrant of left female breast ([place]) 02/02/2016  . CAD (coronary artery disease) 06/20/2018   LHC 7/19: pLAD 65/90, oD1 90, mLCx 85, OM2 50, oRCA 30, EF 50-55 >> s/p CABG in 9/19 (L-LAD)  . Colon polyp 02/2012  . Dupuytren contracture    bilateral hands  . Family history of breast cancer   . Hyperlipidemia   . Hypertension   . Mitral regurgitation 11/07/2013   Echo 7/19: Mild LVH, EF 60-65, no RWMA, mod ot severe MR, massive LAE, PASP 33 // TEE 7/19:  Mild conc LVH, EF 60-65, no RWMA, severe MR with mild post leaflet prolapse, massive // s/p MV repair 07/2018  . On continuous oral anticoagulation 08/22/2015   Started on Xarelto 08/12/2015   . Osteopenia   . Persistent atrial fibrillation ([place]) 08/22/2015   Started late August or early September 2016 // s/p Maze procedure 07/2018  . Personal history of radiation therapy   . Radiation Therapy 04/21/16-05/19/16   left breast 47.72 Gy, boosted to 10 Gy  . 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 S/P Maze operation for atrial fibrillation 08/10/2018   Complete bilateral atrial lesion set using bipolar radiofrequency and cryothermy ablation with clipping of LA appendage  . S/P MVR (mitral valve repair) 08/10/2018   Complex valvuloplasty  including Gore-tex neochord placement x8, Plication of Lateral Commissure and 14m Sorin Memo 4D Ring Annuloplasty SN# [id number] . [place]   . Wears glasses     Past Surgical History:  Procedure Laterality Date  . BIOPSY  07/14/2020   Procedure: BIOPSY;  Surgeon: KRonnette Juniper MD;  [facility]: [facility];  Service: Gastroenterology;;  . BREAST BIOPSY Left 01/28/2016  . BREAST LUMPECTOMY Left 02/23/2016  . BREAST LUMPECTOMY WITH RADIOACTIVE SEED LOCALIZATION Left 02/23/2016   Procedure:  BREAST LUMPECTOMY WITH RADIOACTIVE SEED LOCALIZATION;  Surgeon: BExcell Seltzer MD;  [place] [facility]  Service: General;  Laterality: Left;  . CARDIAC CATHETERIZATION    . CARDIOVERSION N/A 10/02/2015   Procedure: CARDIOVERSION;  Surgeon: MJerline Pain MD;  [facility];  Service: Cardiovascular;  Laterality: N/A;  . CARDIOVERSION N/A 10/05/2018   Procedure: CARDIOVERSION;  Surgeon: RFay Records MD;  [facility];  Service: Cardiovascular;  Laterality: N/A;  . CARDIOVERSION N/A 11/05/2019   Procedure: CARDIOVERSION;  Surgeon: NDorothy Spark MD;  [place]  Service: Cardiovascular;  Laterality: N/A;  . CLIPPING OF ATRIAL APPENDAGE  08/10/2018   Procedure: CLIPPING OF ATRIAL APPENDAGE;  Surgeon: ORexene Alberts MD;  [place] [facility]  Service: Open Heart Surgery;;  . COLONOSCOPY    . COLONOSCOPY WITH PROPOFOL N/A 07/14/2020   Procedure: COLONOSCOPY WITH PROPOFOL;  Surgeon: KRonnette Juniper MD;  [facility];  Service: Gastroenterology;  Laterality: N/A;  . CORONARY ARTERY BYPASS GRAFT N/A 08/10/2018   Procedure: CORONARY ARTERY BYPASS GRAFTING (CABG) x 1, LIMA-LAD,  USING LEFT INTERNAL MAMMARY ARTERY. HARVESTED RIGHT GREATER SAPHENOUS VEIN ENDOSCOPICALLY;  Surgeon: ORexene Alberts MD;  [place]  Service: Open Heart Surgery;  Laterality: N/A;  . CYSTOSCOPY W/ RETROGRADES Bilateral 06/20/2019   Procedure: CYSTOSCOPY WITH RETROGRADE PYELOGRAM;  Surgeon: HArdis Hughs MD;  [facility]  Service: Urology;  Laterality: Bilateral;  . CYSTOSCOPY WITH HYDRODISTENSION AND BIOPSY N/A 06/20/2019   Procedure: CYSTOSCOPY BLADDER  BIOPSY WITH FULGERATION;  Surgeon: HArdis Hughs MD;  [facility]  Service: Urology;  Laterality: N/A;  . DUPUYTREN CONTRACTURE RELEASE  2001   leftx2  . DNectar  right  . DUPUYTREN CONTRACTURE RELEASE Right  05/02/2014   Procedure: EXCISION DUPUYTRENS RIGHT PALMAR/SMALL ;  Surgeon: RCammie Sickle MD;  [place]  Service: Orthopedics;  Laterality: Right;  . HEMOSTASIS CLIP PLACEMENT  07/14/2020   Procedure: HEMOSTASIS CLIP PLACEMENT;  Surgeon: KRonnette Juniper MD;  [facility];  Service: Gastroenterology;;  . IR VERTEBROPLASTY CERV/THOR BX INC UNI/BIL INC/INJECT/IMAGING  08/19/2020  . IR VERTEBROPLASTY CERV/THOR BX INC UNI/BIL INC/INJECT/IMAGING  09/15/2020  . MAZE N/A 08/10/2018   Procedure: MAZE;  Surgeon: ORexene Alberts MD;  [place]  Service: Open Heart Surgery;  Laterality: N/A;  . MITRAL VALVE REPAIR N/A 08/10/2018   Procedure: MITRAL VALVE REPAIR (MVR);  Surgeon: ORexene Alberts MD;  [place] [facility]  Service: Open Heart Surgery;  Laterality: N/A;  glutaraldehyde  . POLYPECTOMY  07/14/2020   Procedure: POLYPECTOMY;  Surgeon: KRonnette Juniper MD;  [facility];  Service: Gastroenterology;;  . RIGHT/LEFT HEART CATH AND CORONARY ANGIOGRAPHY N/A 06/20/2018   Procedure: RIGHT/LEFT HEART CATH AND CORONARY ANGIOGRAPHY;  Surgeon: SBelva Crome MD;  [place];  Service: Cardiovascular;  Laterality: N/A;  . TEE WITHOUT CARDIOVERSION N/A 06/20/2018  Procedure: TRANSESOPHAGEAL ECHOCARDIOGRAM (TEE);  Surgeon: Pixie Casino, MD;  [facility];  Service: Cardiovascular;  Laterality: N/A;  . TEE WITHOUT CARDIOVERSION N/A 08/10/2018   Procedure: TRANSESOPHAGEAL ECHOCARDIOGRAM (TEE);  Surgeon: Rexene Alberts, MD;  Location[place];  Service: Open Heart Surgery;  Laterality: N/A;  . TONSILLECTOMY  age 62    Social History:   reports that she quit smoking about 28 years ago. Her smoking use included cigarettes. She has a 20.00 pack-year smoking history. She has never used smokeless tobacco. She reports current alcohol use of about 3.0 standard drinks of alcohol per week. She reports that she does not use drugs.  Allergies   Allergen Reactions  . Shellfish Allergy Itching  . Amoxicillin-Pot Clavulanate Diarrhea    Family History  Problem Relation Age of Onset  . Cirrhosis Mother   . Breast cancer Mother 35  . Heart failure Father   . Heart attack Father   . Breast cancer Sister        early 64's  . Kidney Stones Sister        loss of kidney due to stones  . Osteoporosis Neg Hx      Prior to Admission medications   Medication Sig Start Date End Date Taking? Authorizing Provider  acetaminophen (TYLENOL) 500 MG tablet Take 500-1,000 mg by mouth every 6 (six) hours as needed for moderate pain or headache.    Yes [provider]  alendronate (FOSAMAX) 70 MG tablet Take 70 mg by mouth once a week. 09/24/20  Yes [provider]  allopurinol (ZYLOPRIM) 100 MG tablet Take 200 mg by mouth every evening.  06/21/13  Yes [provider]  atorvastatin (LIPITOR) 20 MG tablet TAKE 1 TABLET BY MOUTH EVERY DAY Patient taking differently: Take 20 mg by mouth daily. 04/07/20  Yes Camnitz, Will Hassell Done, MD  bisacodyl (DULCOLAX) 10 MG suppository Place 1 suppository (10 mg total) rectally daily as needed for moderate constipation. 09/19/20  Yes Oswald Hillock, MD  cephALEXin (KEFLEX) 500 MG capsule Take 500 mg by mouth 2 (two) times daily. 01/13/21  Yes [provider]  digoxin (LANOXIN) 0.125 MG tablet TAKE ONE TABLET BY MOUTH DAILY MONDAY THROUGH FRIDAY. DO NOT TAKE ON SATURDAY OR SUNDAY. Patient taking differently: Take 0.125 mg by mouth See admin instructions. Take one tablet by mouth daily Monday through Friday.  Do NOT take on Saturday or Sunday. 10/28/20  Yes [name], MD  diltiazem (CARDIZEM CD) 180 MG 24 hr capsule Take 180 mg by mouth daily. 11/05/20  Yes [provider]  fentaNYL (DURAGESIC) 12 MCG/HR Place 1 patch onto the skin every 3 (three) days. 12/09/20  Yes Charlynne Cousins, MD  hydrOXYzine (ATARAX/VISTARIL) 25 MG tablet Take 25 mg by mouth daily. 01/14/21  Yes  [provider]  mesalamine (APRISO) 0.375 g 24 hr capsule Take 0.375 g by mouth daily. 06/01/19  Yes [provider]  metoprolol succinate (TOPROL-XL) 25 MG 24 hr tablet Take 25 mg by mouth daily. 01/14/21  Yes [provider]  metoprolol tartrate (LOPRESSOR) 25 MG tablet Take 1 tablet (25 mg total) by mouth 2 (two) times daily. 12/11/20  Yes Mariel Aloe, MD  tamoxifen (NOLVADEX) 20 MG tablet Take 20 mg by mouth daily.   Yes [provider]  XARELTO 15 MG TABS tablet TAKE 1 TABLET BY MOUTH  DAILY WITH SUPPER Patient taking differently: Take 15 mg by mouth daily. 10/06/20  Yes [name], MD  cyclobenzaprine (FLEXERIL) 5 MG tablet Take 1 tablet (5 mg total) by mouth 3 (three) times daily as needed for muscle spasms. 09/19/20   Oswald Hillock, MD  docusate sodium (COLACE) 100 MG capsule Take 1 capsule (100 mg total) by mouth 2 (two) times daily. 09/19/20   Oswald Hillock, MD  feeding supplement, ENSURE ENLIVE, (ENSURE ENLIVE) LIQD Take 237 mLs by mouth 2 (two) times daily between meals. 08/23/20   Debbe Odea, MD  Multiple Vitamin (MULTIVITAMIN WITH MINERALS) TABS tablet Take 1 tablet by mouth daily. 08/24/20   Debbe Odea, MD  oxyCODONE (OXY IR/ROXICODONE) 5 MG immediate release tablet Take 1 tablet (5 mg total) by mouth every 6 (six) hours as needed for severe pain. 12/09/20   Charlynne Cousins, MD    Physical Exam: Vitals:   01/17/21 0200 01/17/21 0210 01/17/21 0230 01/17/21 0240  BP: (!) 81/51 (!) 91/46 (!) 80/50 (!) 81/44  Pulse: 77 (!) 51 72 (!) 117  Resp: (!) 23 (!) 24 (!) 24 (!) 22  Temp:      TempSrc:      SpO2: 96% 95% 100% 97%  Weight:      Height:        Constitutional: NAD, calm  Eyes: PERTLA, lids and conjunctivae normal ENMT: Mucous membranes are moist. Posterior pharynx clear of any exudate or lesions.   Neck: normal, supple, no masses, no thyromegaly Respiratory:  no wheezing, no cough. No accessory muscle use.   Cardiovascular: Rate ~100 and irregularly irregular. Trace LE edema bilaterally.  Abdomen: No distension, no tenderness, soft. Bowel sounds active.  Musculoskeletal: no clubbing / cyanosis. No joint deformity upper and lower extremities.   Skin: left heel wound, left upper arm skin tear and ecchymosis, upper chest ecchymoses. Warm, dry, well-perfused. Neurologic: CN 2-12 grossly intact. Sensation intact. Moving all extremities.  Psychiatric: Alert and oriented to person, place, and situation. Pleasant and cooperative.    Labs and Imaging on Admission: I have personally reviewed following labs and imaging studies  CBC: Recent Labs  Lab 01/16/21 1909  WBC 6.4  NEUTROABS 4.7  HGB 10.9*  HCT 35.4*  MCV 99.2  PLT 373   Basic Metabolic Panel: Recent Labs  Lab 01/16/21 1909  NA 139  K 3.6  CL 103  CO2 26  GLUCOSE 92  BUN 10  CREATININE 0.78  CALCIUM 8.2*   GFR: Estimated Creatinine Clearance: 44.3 mL/min (by C-G formula based on SCr of 0.78 mg/dL). Liver Function Tests: Recent Labs  Lab 01/16/21 1909  AST 35  ALT 15  ALKPHOS 81  BILITOT 1.1  PROT 5.8*  ALBUMIN 2.3*   No results for input(s): LIPASE, AMYLASE in the last 168 hours. No results for input(s): AMMONIA in the last 168 hours. Coagulation Profile: No results for input(s): INR, PROTIME in the last 168 hours. Cardiac Enzymes: No results for input(s): CKTOTAL, CKMB, CKMBINDEX, TROPONINI in the last 168 hours. BNP (last 3 results) No results for input(s): PROBNP in the last 8760 hours. HbA1C: No results for input(s): HGBA1C in the last 72 hours. CBG: No results for input(s): GLUCAP in the last 168 hours. Lipid Profile: No results for input(s): CHOL, HDL, LDLCALC, TRIG, CHOLHDL, LDLDIRECT in the last 72 hours. Thyroid Function Tests: No results for input(s): TSH, T4TOTAL, FREET4, T3FREE, THYROIDAB in the last 72 hours. Anemia Panel: No results for input(s): VITAMINB12, FOLATE, FERRITIN, TIBC, IRON,  RETICCTPCT in the last 72 hours. Urine analysis:    Component Value Date/Time  COLORURINE YELLOW 08/17/2020 [place] 08/17/2020 0434   LABSPEC 1.011 08/17/2020 [place] 5.0 08/17/2020 [place] 08/17/2020 0434   HGBUR NEGATIVE 08/17/2020 [place] NEGATIVE 08/17/2020 [place] 08/17/2020 0434   PROTEINUR NEGATIVE 08/17/2020 0434   NITRITE NEGATIVE 08/17/2020 0434   LEUKOCYTESUR NEGATIVE 08/17/2020 0434   Sepsis Labs: @LABRCNTIP (procalcitonin:4,lacticidven:4) )No results found for this or any previous visit (from the past 240 hour(s)).   Radiological Exams on Admission: CT Angio Chest PE W and/or Wo Contrast  Result Date: 01/16/2021 CLINICAL DATA:  Shortness of breath EXAM: CT ANGIOGRAPHY CHEST WITH CONTRAST TECHNIQUE: Multidetector CT imaging of the chest was performed using the standard protocol during bolus administration of intravenous contrast. Multiplanar CT image reconstructions and MIPs were obtained to evaluate the vascular anatomy. CONTRAST:  131m OMNIPAQUE IOHEXOL 350 MG/ML SOLN COMPARISON:  None. FINDINGS: Cardiovascular: There is a optimal opacification of the pulmonary arteries. There is no central,segmental, or subsegmental filling defects within the pulmonary arteries. There is moderate cardiomegaly present. No pericardial effusion or thickening. No evidence right heart strain. There is normal three-vessel brachiocephalic anatomy without proximal stenosis. Scattered aortic atherosclerosis is seen. Mediastinum/Nodes: No hilar, mediastinal, or axillary adenopathy. Thyroid gland, trachea, and esophagus demonstrate no significant findings. Lungs/Pleura: Small bilateral pleural effusions are present, right greater than left. Interlobular septal thickening is noted. There is ground-glass streaky airspace opacity seen predominantly at both lung bases. No pneumothorax is noted. Upper Abdomen: No acute abnormalities present  in the visualized portions of the upper abdomen. Contrast reflux seen within the IVC. Musculoskeletal: No chest wall abnormality. No acute or significant osseous findings. Overlying median sternotomy wires are present. Review of the MIP images confirms the above findings. IMPRESSION: 1. No central, segmental, or subsegmental pulmonary embolism 2. Small bilateral pleural effusions, right greater than left 3. Findings suggestive of interstitial edema 4. Ground-glass patchy airspace opacities at both lung bases which could be due to inflammatory etiology or atelectasis. 5.  Aortic Atherosclerosis (ICD10-I70.0). Electronically Signed   By: BPrudencio PairM.D.   On: 01/16/2021 22:13   DG Chest Port 1 View  Result Date: 01/16/2021 CLINICAL DATA:  Shortness of breath. EXAM: PORTABLE CHEST 1 VIEW COMPARISON:  December 01, 2020. FINDINGS: Stable cardiomediastinal silhouette. No pneumothorax is noted. Status post cardiac valve repair. Mild left basilar atelectasis, infiltrate or scarring is noted with probable small loculated left pleural effusion. Minimal right basilar subsegmental atelectasis is noted with small pleural effusion. Bony thorax is unremarkable. IMPRESSION: Mild left basilar atelectasis, infiltrate or scarring is noted with probable small loculated left pleural effusion. Minimal right basilar subsegmental atelectasis is noted with small pleural effusion. Aortic Atherosclerosis (ICD10-I70.0). Electronically Signed   By: JMarijo ConceptionM.D.   On: 01/16/2021 18:25    EKG: Independently reviewed. 2nd degree AV block, Mobitz type II.   Assessment/Plan   1. Hypotension  - Presents from ALF with SOB, debility, and lightheadedness when sitting up and has been hypotensive with SBP in 80s  - She is alert, denies chest pain, does not appear to be septic, is not bleeding, and CTA chest negative for PE  - BP improving with IVF resuscitation in ED  - Hold antihypertensives, check cortisol, continue cardiac  monitoring, check echo    2. Atrial fibrillation; ?Mobitz type II  - Patient with chronic a fib on Xarelto  - Initial EKG concerning for 2nd degree AV block Mobitz type II but does not  appear to be bradycardic or symptomatic  - Continue cardiac monitoring, check digoxin level, hold digoxin and nodal blocking agents for now, check TSH, echocardiogram  3. Chronic hypoxic respiratory failure  - She has required supplemental O2 since contracting COVID 7 weeks ago, stable on 2 Lpm  - There is question of edema on CTA chest though she required IVF in ED for hypotension  - Continue supplemental O2, consider attempt at diuresis if BP allows   4. Elevated troponin  - HS troponin is 31 without chest pain  - Check a second troponin, continue statin, follow-up echo findings   5. Ulcerative colitis  - Quiescent  - Continue mesalamine    6. Chronic pain  - No pain complaints on admission  - Hold narcotics until BP improves   7. Debility  - Patient reports being bedbound since she contracted COVID 6 weeks ago  - She had been at [facility] but recently returned to ALF where she reports being too weak to sit up on her own  - Consult PT   8. Chronic diastolic CHF  - Question of edema on CTA chest and BNP 229, similar to priors  - EF was 60-65% in October 2020 and she had moderate LVH, moderately reduced RV systolic fxn, severe LAE, mild MR, mild-moderate MS, mild TR, and moderate elevation in PA pressures  - She was given fluid boluses in ED for hypotension  - Monitor weight and I/Os, updating echo as above, consider diuresing if BP improves     9. Pressure injury, skin tear  - Left heal wound and proximal LUE skin tears present on arrival  - Continue wound care    DVT prophylaxis: Xarelto  Code Status: DNR, confirmed with patient in ED  Level of Care: Level of care: Stepdown Family Communication: None present on admission  Disposition Plan:  Patient is from: ALF  Anticipated d/c is to: SNF   Anticipated d/c date is: 01/19/21 Patient currently: Hypotensive Consults called: None  Admission status: Inpatient     Vianne Bulls, MD [facility]  01/17/2021, 3:14 AM

## 2021-01-17 NOTE — ED Provider Notes (Addendum)
I assumed care of this patient.  Please see previous provider note for further details of Hx, PE.  Briefly patient is a 80 y.o. female who presented here for SOB. No pneumonia or PE. Looks dehydrated and has low BPs. Plan for additional IVF and reassess..  Noted to be in [place] and still with soft BPs bw 80s-100s. Given addt'l fluids and admitted to medicine.    [name]1-3 Lead EKG Interpretation Performed by: Fatima Blank, MD Authorized by: Fatima Blank, MD     Interpretation: abnormal     ECG rate:  115   ECG rate assessment: tachycardic     Rhythm: atrial fibrillation     Ectopy: none   .Critical Care Performed by: Fatima Blank, MD Authorized by: Fatima Blank, MD   Critical care provider statement:    Critical care time (minutes):  30   Critical care was necessary to treat or prevent imminent or life-threatening deterioration of the following conditions:  Dehydration   Critical care was time spent personally by me on the following activities:  Discussions with consultants, evaluation of patient's response to treatment, examination of patient, ordering and performing treatments and interventions, ordering and review of laboratory studies, ordering and review of radiographic studies, pulse oximetry, re-evaluation of patient's condition, obtaining history from patient or surrogate and review of old charts        Aharon Carriere, Grayce Sessions, MD 01/17/21 [phone]

## 2021-01-17 NOTE — Evaluation (Signed)
Physical Therapy Evaluation Patient Details Name: Michele Green MRN: [id number] DOB: 11-26-1940 Today's Date: 01/17/2021   History of Present Illness  Michele Green is a 80 y.o. female with medical history significant for atrial fibrillation on Xarelto, history of breast cancer, CAD, history of Maze procedure and mitral valve repair, chronic pain, ulcerative colitis, and 2 L/min supplemental oxygen requirement, now presenting to the emergency department for evaluation of shortness of breath and lightheadedness when sitting up.  Patient was hospitalized with COVID-19 7 weeks ago, was discharged to an SNF on 12/11/2020 but is now back at ALF and reports that she has been bedbound ever since the hospital discharge and has now been able to even sit up in bed without assistance.  She has been requiring 2 L/min of supplemental oxygen since the hospital discharge but felt more short of breath over the past few days and has also been lightheaded when sitting up.  She denies any chest pain, denies fevers or chills, denies abdominal pain, dysuria, or flank pain.  Reports that she was told she had a UTI a few days ago and was given Keflex but notes that she was surprised to hear this as she did not have any symptoms  Clinical Impression   Pt was very fatigued from being in ER all last night, but still willing to participate and try. Pt able to sit EOB and sit tot stand several times withside steps to head of bed. Pt did request to lie back down and IV tea in to fix IV placement. Pt just recently DC from [facility] to her ALF for maybe 1-2 days and did not do well. Will see if patient able to return to ALF with HHPT (depending on how much assist they can provide int eh beginning) or if she will need to return to [facility] again prior to ALF.     Follow Up Recommendations SNF (depending pt progress or back to ALF with HHPT?)    Equipment Recommendations       Recommendations for Other Services        Precautions / Restrictions Precautions Precautions: None Precaution Comments: be careful, pt has very thin skin      Mobility  Bed Mobility Overal bed mobility: Needs Assistance Bed Mobility: Supine to Sit;Sit to Supine     Supine to sit: Mod assist Sit to supine: Mod assist   General bed mobility comments: help with upper body and LEs using pad under patient to help minmize sheer on skin    Transfers Overall transfer level: Needs assistance Equipment used: Rolling walker (2 wheeled) Transfers: Sit to/from Stand Sit to Stand: Min guard Stand pivot transfers: Min guard       General transfer comment: was able to also take several side steps to head of bed for positioning  and sit to stand several times to change bed linens. tolerated this well, until IV slipped out. Was very fatigued from long night in the ED as well and wanted to rest.  Ambulation/Gait                Stairs            Wheelchair Mobility    Modified Rankin (Stroke Patients Only)       Balance  Pertinent Vitals/Pain Pain Assessment: Faces Pain Score: 5  Faces Pain Scale: Hurts little more Pain Location: pains with all movement , " all over" Pain Descriptors / Indicators: Aching Pain Intervention(s): Monitored during session;Repositioned    Home Living Family/patient expects to be discharged to:: Assisted living               Home Equipment: Walker - 2 wheels;Walker - 4 wheels;Wheelchair - manual;Shower seat;Grab bars - toilet Additional Comments: pt was in independent home at [facility] prior to [facility] admission, then went to [facility] , then recently to ALF at [facility] only for 1-2 days and did nto do well.    Prior Function Level of Independence: Independent with assistive device(s)         Comments: Pt stated in [facility] she was using RW and ambulting, however also states" I can' do anything now" .  Unclear of patient true PLOF upon DC from SNF.     Hand Dominance        Extremity/Trunk Assessment        Lower Extremity Assessment Lower Extremity Assessment: Generalized weakness (pt has wekeness grossly 3+/5 BLE and limited DF on R ankel due to pain with heel sore she stated. So this tells me she must having been lying around a lot.)       Communication   Communication: No difficulties  Cognition Arousal/Alertness: Awake/alert Behavior During Therapy: WFL for tasks assessed/performed Overall Cognitive Status: Within Functional Limits for tasks assessed                                        General Comments      Exercises     Assessment/Plan    PT Assessment Patient needs continued PT services  PT Problem List Decreased strength;Decreased activity tolerance;Decreased balance;Decreased mobility       PT Treatment Interventions DME instruction;Gait training;Functional mobility training;Therapeutic activities;Therapeutic exercise;Patient/family education    PT Goals (Current goals can be found in the Care Plan section)  Acute Rehab PT Goals Patient Stated Goal: I want to walk and move around again PT Goal Formulation: With patient Time For Goal Achievement: 01/30/21 Potential to Achieve Goals: Good    Frequency Min 3X/week   Barriers to discharge        Co-evaluation               AM-PAC PT "6 Clicks" Mobility  Outcome Measure Help needed turning from your back to your side while in a flat bed without using bedrails?: A Little Help needed moving from lying on your back to sitting on the side of a flat bed without using bedrails?: A Little Help needed moving to and from a bed to a chair (including a wheelchair)?: A Little Help needed standing up from a chair using your arms (e.g., wheelchair or bedside chair)?: A Lot Help needed to walk in hospital room?: A Lot Help needed climbing 3-5 steps with a railing? : A Lot 6 Click Score:  15    End of Session   Activity Tolerance: Patient limited by fatigue Patient left: in bed;with call bell/phone within reach;with nursing/sitter in room Nurse Communication: Mobility status PT Visit Diagnosis: Muscle weakness (generalized) (M62.81)    Time: 4008-6761 PT Time Calculation (min) (ACUTE ONLY): 27 min   Charges:   PT Evaluation $PT Eval Moderate Complexity: 1 Mod PT Treatments $Therapeutic Activity: 8-22 mins  Gatha Mayer, [place], MPT Acute Rehabilitation Services Office: [phone] Pager: [phone] 01/17/2021   Clide Dales 01/17/2021, 5:58 PM

## 2021-01-17 NOTE — Progress Notes (Addendum)
PROGRESS NOTE    ONESTI BONFIGLIO  YOV:[id number] DOB: 06-24-1941 DOA: 01/16/2021 PCP: [name], MD    Brief Narrative:  Mrs. Briddell was admitted to the hospital with the working diagnosis of hypotension.  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  Recent hospitalization for COVID-19 viral pneumonia, discharged to skilled nursing facility 12/11/2020, and posteriorly to assisted living facility.  Reported few days of worsening dyspnea despite supplemental oxygen per nasal cannula, apparently she was diagnosed with urinary tract infection and received cephalexin.  On her initial physical examination her oximetry was in the upper 90s on 2 L/min per nasal cannula, heart rate 741, systolic blood pressure 28/78, 91/46.  Lungs had no wheezing, rhonchi or rales, heart S1-S2 present, irregularly irregular, abdomen soft, trace bilateral lower extremity edema.  Sodium 139, potassium 3.6, chloride 103, bicarb 26, glucose 92, BUN 10, creatinine 0.78, AST 35, ALT 15, troponin I 31-30, white count 6.4, hemoglobin 10.9, hematocrit 35.4, platelets 176. SARS COVID-19 is pending.  Chest radiograph with small bilateral pleural effusions, left lower lobe interstitial infiltrate, increased lung markings at the apices bilaterally. CT chest negative for pulmonary embolism, small bilateral pleural effusions, groundglass opacities predominantly at bases/left side.  EKG 111 bpm, normal axis, second-degree AV block type II, no significant ST segment or T wave changes.  Assessment & Plan:   Principal Problem:   Hypotension Active Problems:   Persistent atrial fibrillation (HCC)   Chronic diastolic HF (heart failure) (HCC)   Pressure injury of skin   Chronic respiratory failure with hypoxia (HCC)    Elevated troponin   Debility   Chronic pain   Ulcerative colitis ([place])   1. Hypotension. Likely multifactorial, patient with appropriate mentation and renal function stable, warm extremities, showing appropriate perfusion.  SP 4 Liters of isotonic saline infusion with MAP in around 55 mmHg. Patient has been on systemic steroids for COVID 19 viral pneumonia for about 13 days, daily dose 40 mg, then taper over 3 days.  Echocardiogram from 08/2019 with preserved LV systolic function 60 to 67% EF. Troponin elevation due to hypotension no ACS pattern, she is chest pain free.   Will start patient on midodrine, continue isotonic fluids at 50 ml per hours, add stress dose steroids, and follow on serum cortisol requested on admission.  Monitor mentation and urine output, keep MAP around 60 mmHg   2. Paroxysmal atrial fibrillation, second degree AV block. Continue telemetry monitoring, hold on digoxin and diltiazem. Anticoagulation with rivaroxaban.   3. Chronic diastolic heart failure. No clinical signs of exacerbation, or low cardiac output.  Continue to hold on antihypertensive medications, will check echocardiogram.   4. Ulcerative colitis. No diarrhea or abdominal pain, no signs of exacerbation. Continue with mesalamine.   5. Ambulatory dysfunction, chronic pain syndrome, left heal and proximal LUE skin tears. Present on admission, continue skin care per protocol.  Add multivitamins and thiamine, consult nutrition.   6. Dyslipidemia. Continue with atorvastatin.   7. Hx of breast cancer/ chronic anemia. Continue with tamoxifen. Hgb is 8,8 and hct at 28,7.  No indication for PRBC transfusion.   8. Hypomagnesemia. Mg 1,5 with K at 4,0 and renal function with serum cr at 0,59. Continue isotonic saline at low rate 50 ml per hr, add 2 grams Mag sulfate, and follow renal functio in am.    Patient continue  to be at high risk for worsening hypotension   Status is: Inpatient  Remains  inpatient appropriate because:IV treatments appropriate due to intensity of illness or inability to take PO   Dispo: The patient is from: ALF              Anticipated d/c is to: ALF              Patient currently is not medically stable to d/c.   Difficult to place patient No   DVT prophylaxis: rivaroxaban   Code Status:   DNR   Family Communication:  Patient will talk to her sister.      Subjective: Patient continue to be very weak and deconditioned, no nausea or vomiting, no chest pain, no abdominal pain or diarrhea. Positive dyspnea on exertion, poor mobility and low po intake.   Objective: Vitals:   01/17/21 0700 01/17/21 0730 01/17/21 0800 01/17/21 0818  BP: (!) 82/45 (!) 87/57 (!) 92/54   Pulse: (!) 109 97 93   Resp: 16 16 18    Temp:  98.3 F (36.8 C)  98.4 F (36.9 C)  TempSrc:  Oral  Axillary  SpO2: 98% 100% 98%   Weight:      Height:        Intake/Output Summary (Last 24 hours) at 01/17/2021 2878 Last data filed at 01/17/2021 0700 Gross per 24 hour  Intake 468.23 ml  Output --  Net 468.23 ml   Filed Weights   01/16/21 1818 01/17/21 0500  Weight: 51.7 kg 57.4 kg    Examination:   General: deconditioned and ill looking appearing  Neurology: Awake and alert, non focal  E ENT: positive pallor, no icterus, oral mucosa dry.  Cardiovascular: No JVD. S1-S2 present, rhythmic, no gallops, rubs, or murmurs. No lower extremity edema. Pulmonary: positive breath sounds bilaterally, scattered rales on anterior auscultation. Gastrointestinal. Abdomen soft and non tender Skin. Lower extremities wounds with dressings in place.  Musculoskeletal: no joint deformities     Data Reviewed: I have personally reviewed following labs and imaging studies  CBC: Recent Labs  Lab 01/16/21 1909 01/17/21 0524  WBC 6.4 6.2  NEUTROABS 4.7  --   HGB 10.9* 8.8*  HCT 35.4* 28.7*  MCV 99.2 101.8*  PLT 176 676*   Basic Metabolic Panel: Recent Labs  Lab 01/16/21 1909  01/17/21 0524  NA 139 139  K 3.6 4.0  CL 103 110  CO2 26 22  GLUCOSE 92 79  BUN 10 8  CREATININE 0.78 0.59  CALCIUM 8.2* 6.8*  MG  --  1.5*   GFR: Estimated Creatinine Clearance: 46.8 mL/min (by C-G formula based on SCr of 0.59 mg/dL). Liver Function Tests: Recent Labs  Lab 01/16/21 1909  AST 35  ALT 15  ALKPHOS 81  BILITOT 1.1  PROT 5.8*  ALBUMIN 2.3*   No results for input(s): LIPASE, AMYLASE in the last 168 hours. No results for input(s): AMMONIA in the last 168 hours. Coagulation Profile: No results for input(s): INR, PROTIME in the last 168 hours. Cardiac Enzymes: No results for input(s): CKTOTAL, CKMB, CKMBINDEX, TROPONINI in the last 168 hours. BNP (last 3 results) No results for input(s): PROBNP in the last 8760 hours. HbA1C: No results for input(s): HGBA1C in the last 72 hours. CBG: No results for input(s): GLUCAP in the last 168 hours. Lipid Profile: No results for input(s): CHOL, HDL, LDLCALC, TRIG, CHOLHDL, LDLDIRECT in the last 72 hours. Thyroid Function Tests: Recent Labs    01/17/21 0256  TSH 1.974   Anemia Panel: No results for input(s): VITAMINB12, FOLATE, FERRITIN, TIBC, IRON, RETICCTPCT in the last 72 hours.    Radiology Studies: I have reviewed all of the imaging during this hospital visit personally     Scheduled Meds: . allopurinol  200 mg Oral QPM  . atorvastatin  20 mg Oral Daily  . Chlorhexidine Gluconate Cloth  6 each Topical Daily  . docusate sodium  100 mg Oral BID  . feeding supplement  237 mL Oral BID BM  . mesalamine  0.375 g Oral Daily  . multivitamin with minerals  1 tablet Oral Daily  . Rivaroxaban  15 mg Oral Daily  . tamoxifen  20 mg Oral Daily   Continuous Infusions: . sodium chloride    . sodium chloride 100 mL/hr at 01/17/21 0720     LOS: 0 days        Petrita Blunck Gerome Apley, MD

## 2021-01-18 DIAGNOSIS — I5032 Chronic diastolic (congestive) heart failure: Secondary | ICD-10-CM | POA: Diagnosis not present

## 2021-01-18 DIAGNOSIS — J9611 Chronic respiratory failure with hypoxia: Secondary | ICD-10-CM | POA: Diagnosis not present

## 2021-01-18 DIAGNOSIS — I959 Hypotension, unspecified: Secondary | ICD-10-CM | POA: Diagnosis not present

## 2021-01-18 DIAGNOSIS — G8929 Other chronic pain: Secondary | ICD-10-CM | POA: Diagnosis not present

## 2021-01-18 LAB — CBC
HCT: 26.8 % — ABNORMAL LOW (ref 36.0–46.0)
Hemoglobin: 8.5 g/dL — ABNORMAL LOW (ref 12.0–15.0)
MCH: 30.5 pg (ref 26.0–34.0)
MCHC: 31.7 g/dL (ref 30.0–36.0)
MCV: 96.1 fL (ref 80.0–100.0)
Platelets: 143 10*3/uL — ABNORMAL LOW (ref 150–400)
RBC: 2.79 MIL/uL — ABNORMAL LOW (ref 3.87–5.11)
RDW: 14.8 % (ref 11.5–15.5)
WBC: 5.2 10*3/uL (ref 4.0–10.5)
nRBC: 0 % (ref 0.0–0.2)

## 2021-01-18 LAB — BASIC METABOLIC PANEL
Anion gap: 8 (ref 5–15)
BUN: 9 mg/dL (ref 8–23)
CO2: 19 mmol/L — ABNORMAL LOW (ref 22–32)
Calcium: 7.2 mg/dL — ABNORMAL LOW (ref 8.9–10.3)
Chloride: 114 mmol/L — ABNORMAL HIGH (ref 98–111)
Creatinine, Ser: 0.62 mg/dL (ref 0.44–1.00)
GFR, Estimated: >60 mL/min (ref >60)
Glucose, Bld: 145 mg/dL — ABNORMAL HIGH (ref 70–99)
Potassium: 4.2 mmol/L (ref 3.5–5.1)
Sodium: 141 mmol/L (ref 135–145)

## 2021-01-18 LAB — MAGNESIUM: Magnesium: 2.2 mg/dL (ref 1.7–2.4)

## 2021-01-18 MED ORDER — METOPROLOL TARTRATE 25 MG PO TABS
25.0000 mg | ORAL_TABLET | Freq: Two times a day (BID) | ORAL | Status: DC
  Administered 2021-01-18 – 2021-01-19 (×3): 25 mg via ORAL
  Filled 2021-01-18 (×3): qty 1

## 2021-01-18 MED ORDER — HYDROXYZINE HCL 25 MG PO TABS
25.0000 mg | ORAL_TABLET | Freq: Once | ORAL | Status: AC
  Administered 2021-01-18: 25 mg via ORAL
  Filled 2021-01-18: qty 1

## 2021-01-18 MED ORDER — HYDROCORTISONE NA SUCCINATE PF 100 MG IJ SOLR
50.0000 mg | Freq: Three times a day (TID) | INTRAMUSCULAR | Status: DC
  Administered 2021-01-18 – 2021-01-19 (×5): 50 mg via INTRAVENOUS
  Filled 2021-01-18 (×6): qty 2

## 2021-01-18 NOTE — Progress Notes (Addendum)
PROGRESS NOTE    Michele Green  KZL:[id number] DOB: 1941/04/28 DOA: 01/16/2021 PCP: [name], MD    Brief Narrative:  Mrs. Michele Green was admitted to the hospital with the working diagnosis of hypotension due to adrenal insufficiency.   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  Recent hospitalization for COVID-19 viral pneumonia, discharged to skilled nursing facility 12/11/2020, and posteriorly to assisted living facility.  Reported few days of worsening dyspnea despite supplemental oxygen per nasal cannula, apparently she was diagnosed with urinary tract infection and received cephalexin.  On her initial physical examination her oximetry was in the upper 90s on 2 L/min per nasal cannula, heart rate 390, systolic blood pressure 30/09, 91/46.  Lungs had no wheezing, rhonchi or rales, heart S1-S2 present, irregularly irregular, abdomen soft, trace bilateral lower extremity edema.  Sodium 139, potassium 3.6, chloride 103, bicarb 26, glucose 92, BUN 10, creatinine 0.78, AST 35, ALT 15, troponin I 31-30, white count 6.4, hemoglobin 10.9, hematocrit 35.4, platelets 176. SARS COVID-19 is pending.  Chest radiograph with small bilateral pleural effusions, left lower lobe interstitial infiltrate, increased lung markings at the apices bilaterally. CT chest negative for pulmonary embolism, small bilateral pleural effusions, groundglass opacities predominantly at bases/left side.  EKG 111 bpm, normal axis, second-degree AV block type II, no significant ST segment or T wave changes.  Patient was placed on isotonic IV fluids (4 L isotonic saline bolus), stress dose steroids and midodrine with improvement in her blood pressure.  Troponin elevation due to hypotension no ACS pattern.     Assessment & Plan:   Principal Problem:   Hypotension Active Problems:   Persistent atrial fibrillation (HCC)   Chronic diastolic HF (heart failure) (HCC)   Pressure injury of skin   Chronic respiratory failure with hypoxia (HCC)   Elevated troponin   Debility   Chronic pain   Ulcerative colitis ([place])    1. Hypotension adrenal insuficiency. Serum cortisol 11.7 inappropriate low for patient's condition.  Urine output has been low down to 100 cc (documented), but mentation is adequate.  Blood pressure systolic 233 to 007 mmHg this am, positive tachycardia 118 to 120.  Echocardiogram with preserved LV (60 to 62%) and RV systolic function. RSVP is 52.9 mmHg.   Decrease steroids to 50 mg methylprednisolone q 8 hrs and discontinue IV fluids and midodrine. Continue telemetry monitoring. Ok to transfer from stepdown to telemetry ward. .   2. Paroxysmal atrial fibrillation, second degree AV block. will resume metoprolol to prevent RVR. Telemetry personally reviewed patient has been sinus tachycardia. At home patient on metoprolol, diltiazem and digoxin.   Continue anticoagulation with rivaroxaban.   3. Chronic diastolic heart failure/ sp mitral valve repair (2019). No clinical signs of exacerbation, or low cardiac output.  Follow up echocardiogram with preserved LV systolic function.   4. Ulcerative colitis. No active diarrhea or abdominal pain, no signs of exacerbation. On mesalamine.   5. Ambulatory dysfunction, chronic pain syndrome, left heal and proximal LUE skin tears. Continue with multivitamins and thiamine. Patient will need SNF at discharge. Continue with local skin care.    6. Dyslipidemia. On atorvastatin.   7. Hx of breast cancer/ chronic anemia. On tamoxifen.  Stable cell count with hgb at 8,5 and hct at 26,8  8. Hypomagnesemia/ hyperchloremic non gap metabolic acidosis. Stable renal function with serum cr at  0,62 with K at 4,2 and serum bicarbonate at  19, Mg is 2,2. Cl 114 Discontinue IV isotonic saline.   9. Gout. No exacerbation, continue with allopurinol.   Patient continue to be at high risk for worsening hypotension   Status is: Inpatient  Remains inpatient appropriate because:IV treatments appropriate due to intensity of illness or inability to take PO   Dispo: The patient is from: ALF              Anticipated d/c is to: SNF              Patient currently is not medically stable to d/c.   Difficult to place patient No    DVT prophylaxis: rivaroxaban   Code Status:   DNR   Family Communication:  I spoke over the phone with the patient's son about patient's  condition, plan of care, prognosis and all questions were addressed.      Subjective: Patient is feeling better, no nausea or vomiting no chest pain or dyspnea, no palpitation, continue to be very weak and deconditioned, mild confusion but no agitation.   Objective: Vitals:   01/18/21 0640 01/18/21 0645 01/18/21 0650 01/18/21 0813  BP:      Pulse: (!) 118 (!) 118 (!) 118   Resp: 12 11 14    Temp:    98 F (36.7 C)  TempSrc:    Axillary  SpO2: 100% 100% 100%   Weight:      Height:        Intake/Output Summary (Last 24 hours) at 01/18/2021 0831 Last data filed at 01/18/2021 0600 Gross per 24 hour  Intake 1633.8 ml  Output 100 ml  Net 1533.8 ml   Filed Weights   01/16/21 1818 01/17/21 0500 01/18/21 0500  Weight: 51.7 kg 57.4 kg 59.6 kg    Examination:   General: deconditioned  Neurology: Awake and alert, non focal  E ENT: no pallor, no icterus, oral mucosa moist Cardiovascular: No JVD. S1-S2 present, rhythmic, tachycardic no gallops, rubs, or murmurs. trace lower extremity edema. Pulmonary: positive breath sounds bilaterally, Gastrointestinal. Abdomen soft and non tender Skin. Left foot ulcers with dressing in place.  Musculoskeletal: no joint deformities     Data Reviewed: I have personally reviewed following labs and imaging  studies  CBC: Recent Labs  Lab 01/16/21 1909 01/17/21 0524 01/18/21 0325  WBC 6.4 6.2 5.2  NEUTROABS 4.7  --   --   HGB 10.9* 8.8* 8.5*  HCT 35.4* 28.7* 26.8*  MCV 99.2 101.8* 96.1  PLT 176 145* 196*   Basic Metabolic Panel: Recent Labs  Lab 01/16/21 1909 01/17/21 0524 01/18/21 0325  NA 139 139 141  K 3.6 4.0 4.2  CL 103 110 114*  CO2 26 22 19*  GLUCOSE 92 79 145*  BUN 10 8 9   CREATININE 0.78 0.59 0.62  CALCIUM 8.2* 6.8* 7.2*  MG  --  1.5* 2.2   GFR: Estimated Creatinine Clearance: 46.8 mL/min (by C-G formula based on SCr of 0.62 mg/dL). Liver Function Tests: Recent Labs  Lab 01/16/21 1909  AST 35  ALT 15  ALKPHOS 81  BILITOT 1.1  PROT 5.8*  ALBUMIN 2.3*   No results for input(s): LIPASE, AMYLASE in the last 168 hours. No results for input(s): AMMONIA in the last 168 hours. Coagulation Profile: No results for input(s): INR, PROTIME in the last 168 hours. Cardiac Enzymes: No results for input(s): CKTOTAL, CKMB, CKMBINDEX, TROPONINI in the last 168 hours. BNP (last 3 results) No  results for input(s): PROBNP in the last 8760 hours. HbA1C: No results for input(s): HGBA1C in the last 72 hours. CBG: No results for input(s): GLUCAP in the last 168 hours. Lipid Profile: No results for input(s): CHOL, HDL, LDLCALC, TRIG, CHOLHDL, LDLDIRECT in the last 72 hours. Thyroid Function Tests: Recent Labs    01/17/21 0256  TSH 1.974   Anemia Panel: No results for input(s): VITAMINB12, FOLATE, FERRITIN, TIBC, IRON, RETICCTPCT in the last 72 hours.    Radiology Studies: I have reviewed all of the imaging during this hospital visit personally     Scheduled Meds: . allopurinol  200 mg Oral QPM  . atorvastatin  20 mg Oral Daily  . Chlorhexidine Gluconate Cloth  6 each Topical Daily  . docusate sodium  100 mg Oral BID  . feeding supplement  237 mL Oral BID BM  . hydrocortisone sod succinate (SOLU-CORTEF) inj  100 mg Intravenous Q8H  . mesalamine  0.375 g Oral  Daily  . midodrine  5 mg Oral TID WC  . multivitamin with minerals  1 tablet Oral Daily  . mupirocin ointment  1 application Nasal BID  . Rivaroxaban  15 mg Oral Daily  . tamoxifen  20 mg Oral Daily  . thiamine  100 mg Oral BID   Continuous Infusions: . sodium chloride    . sodium chloride 50 mL/hr at 01/18/21 0315     LOS: 1 day        Mauricio Gerome Apley, MD

## 2021-01-18 NOTE — Progress Notes (Signed)
   01/18/21 1146  Assess: MEWS Score  Temp 97.9 F (36.6 C)  BP 102/63  Pulse Rate (!) 122  Resp 20  Level of Consciousness Alert  SpO2 100 %  Assess: MEWS Score  MEWS Temp 0  MEWS Systolic 0  MEWS Pulse 2  MEWS RR 0  MEWS LOC 0  MEWS Score 2  MEWS Score Color Yellow  Assess: if the MEWS score is Yellow or Red  Were vital signs taken at a resting state? Yes  Focused Assessment No change from prior assessment  Early Detection of Sepsis Score *See Row Information* Low  MEWS guidelines implemented *See Row Information* No, previously yellow, continue vital signs every 4 hours  Treat  Pain Scale 0-10  Pain Score 0  Take Vital Signs  Increase Vital Sign Frequency  Yellow: Q 2hr X 2 then Q 4hr X 2, if remains yellow, continue Q 4hrs (cont q 4 from ICU)  Escalate  MEWS: Escalate  (cont. q4 vitals from 2nd floor)  Notify: Charge Nurse/RN  Name of Charge Nurse/RN Notified Myriam Jacobson  Date Charge Nurse/RN Notified 01/18/21  Time Charge Nurse/RN Notified 1202  Notify: Provider  Provider Name/Title provider already aware  Document  Progress note created (see row info) Yes

## 2021-01-19 DIAGNOSIS — G8929 Other chronic pain: Secondary | ICD-10-CM | POA: Diagnosis not present

## 2021-01-19 DIAGNOSIS — R5381 Other malaise: Secondary | ICD-10-CM | POA: Diagnosis not present

## 2021-01-19 DIAGNOSIS — I5032 Chronic diastolic (congestive) heart failure: Secondary | ICD-10-CM | POA: Diagnosis not present

## 2021-01-19 DIAGNOSIS — I959 Hypotension, unspecified: Secondary | ICD-10-CM | POA: Diagnosis not present

## 2021-01-19 LAB — BASIC METABOLIC PANEL
Anion gap: 6 (ref 5–15)
BUN: 14 mg/dL (ref 8–23)
CO2: 23 mmol/L (ref 22–32)
Calcium: 7.8 mg/dL — ABNORMAL LOW (ref 8.9–10.3)
Chloride: 110 mmol/L (ref 98–111)
Creatinine, Ser: 0.76 mg/dL (ref 0.44–1.00)
GFR, Estimated: >60 mL/min (ref >60)
Glucose, Bld: 116 mg/dL — ABNORMAL HIGH (ref 70–99)
Potassium: 3.6 mmol/L (ref 3.5–5.1)
Sodium: 139 mmol/L (ref 135–145)

## 2021-01-19 MED ORDER — METOPROLOL TARTRATE 5 MG/5ML IV SOLN: 5.0000 mg | INTRAVENOUS | Status: DC | PRN

## 2021-01-19 MED ORDER — METOPROLOL TARTRATE 50 MG PO TABS
50.0000 mg | ORAL_TABLET | Freq: Two times a day (BID) | ORAL | Status: DC
  Administered 2021-01-19: 50 mg via ORAL
  Filled 2021-01-19: qty 1

## 2021-01-19 MED ORDER — DILTIAZEM HCL ER COATED BEADS 180 MG PO CP24
180.0000 mg | ORAL_CAPSULE | Freq: Every day | ORAL | Status: DC
  Administered 2021-01-19 – 2021-01-21 (×3): 180 mg via ORAL
  Filled 2021-01-19 (×3): qty 1

## 2021-01-19 MED ORDER — METOPROLOL TARTRATE 25 MG PO TABS
25.0000 mg | ORAL_TABLET | Freq: Once | ORAL | Status: AC
  Administered 2021-01-19: 25 mg via ORAL
  Filled 2021-01-19: qty 1

## 2021-01-19 MED ORDER — MIDODRINE HCL 5 MG PO TABS
5.0000 mg | ORAL_TABLET | Freq: Three times a day (TID) | ORAL | Status: DC
  Administered 2021-01-19 – 2021-01-20 (×3): 5 mg via ORAL
  Filled 2021-01-19 (×3): qty 1

## 2021-01-19 MED ORDER — METOPROLOL TARTRATE 25 MG PO TABS: 25.0000 mg | ORAL_TABLET | Freq: Three times a day (TID) | ORAL | Status: DC

## 2021-01-19 NOTE — Progress Notes (Addendum)
PROGRESS NOTE    Michele Green  GYB:[id number] DOB: Feb 18, 1941 DOA: 01/16/2021 PCP: [name], MD    Brief Narrative:  Michele Green was admitted to the hospital with the working diagnosis of hypotension due to adrenal insufficiency.   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 Recent hospitalization for COVID-19 viral pneumonia, discharged to skilled nursing facility 12/11/2020,and posteriorly to assisted living facility. Reported few days of worsening dyspnea despite supplemental oxygen per nasal cannula, apparently she was diagnosed with urinary tract infection and received cephalexin. On her initial physical examination her oximetry was in the upper 90s on 2 L/min per nasal cannula, heart rate 876, systolic blood pressure 81/15, 91/46. Lungs had no wheezing, rhonchi or rales, heart S1-S2 present, irregularly irregular, abdomen soft, trace bilateral lower extremity edema.  Sodium 139, potassium 3.6, chloride 103, bicarb 26, glucose 92, BUN 10, creatinine 0.78, AST 35, ALT 15, troponin I 31-30, white count 6.4, hemoglobin 10.9, hematocrit 35.4, platelets 176. SARS COVID-19 is pending.  Chest radiograph with small bilateral pleural effusions, left lower lobe interstitial infiltrate, increased lung markings at the apices bilaterally. CT chest negative for pulmonary embolism, small bilateral pleural effusions, groundglass opacities predominantly at bases/left side.  EKG 111 bpm, normal axis, second-degree AV block type II, no significant ST segment or T wave changes.  Patient was placed on isotonic IV fluids (4 L isotonic saline bolus), stress dose steroids and midodrine with improvement in her blood pressure.  Troponin elevation due to hypotension no ACS pattern.      Assessment & Plan:   Principal Problem:   Hypotension Active Problems:   Persistent atrial fibrillation (HCC)   Chronic diastolic HF (heart failure) (HCC)   Pressure injury of skin   Chronic respiratory failure with hypoxia (HCC)   Elevated troponin   Debility   Chronic pain   Ulcerative colitis ([place])   1. Hypotension adrenal insuficiency. Serum cortisol 11.7 inappropriate low for patient's condition.  Echocardiogram with preserved LV (60 to 72%) and RV systolic function. RSVP is 52.9 mmHg.   Blood pressure 115 to 620 mmHg systolic, HR continue to be elevated 110 to 120.   Continue with systemic corticosteroids with 50 mg methylprednisolone q 8 hr. Out of bed to chair tid with meals, continue PT and OT. Nutrition consult.   2. Paroxysmal atrial fibrillation, second degree AV block. At home patient on metoprolol, diltiazem and digoxin.   Persistent sinus tachycardia, tolerated well metoprolol 25 mg po bid, will resume diltiazem 180 mg daily. Continue to hold on digoxin for now.   Anticoagulation with rivaroxaban.   3. Chronic diastolic heart failure/ sp mitral valve repair (2019). No clinical signs of exacerbation, or low cardiac output.  Continue to hold on diuretic therapy.   4. Ulcerative colitis. No active diarrhea or abdominal pain, no signs of exacerbation. Continue with mesalamine.   5. Ambulatory dysfunction, chronic pain syndrome, left heal and proximal LUE skin tears. On multivitamins and thiamine.  Local skin care.   Consult TOC for SNF placement.   6. Dyslipidemia. Continue with atorvastatin.   7. Hx of breast cancer/ chronic disease anemia. Continue with tamoxifen. Cell count has been stable.   8. Hypomagnesemia/ hyperchloremic non gap metabolic acidosis. now off IV fluids, with improved hemodynamics. Renal function continue to be stable with serum cr at 0,76, K at 3,6 and bicarbonate  at 95.  Recheck blood work in 48 hrs if needed.    9. Gout. No signs of acute exacerbation, continue with allopurinol.    Status is: Inpatient  Remains inpatient appropriate because:IV treatments appropriate due to intensity of illness or inability to take PO   Dispo: The patient is from: ALF              Anticipated d/c is to: SNF              Patient currently is not medically stable to d/c.   Difficult to place patient No   DVT prophylaxis: rivaroxaban   Code Status:    DNR   Family Communication:  No family at the bedside     Subjective: Patient is feeling better but continue to be very weak and deconditioned not at her baseline, poor appetite. No dyspnea or chest pain, no nausea or vomiting.   Objective: Vitals:   01/18/21 2017 01/18/21 2354 01/19/21 0457 01/19/21 0600  BP: 115/78 115/83 104/71   Pulse: (!) 122 (!) 122 (!) 123   Resp: 20 16 18    Temp: (!) 97.5 F (36.4 C) 97.6 F (36.4 C) (!) 97.5 F (36.4 C)   TempSrc: Oral Oral Oral   SpO2: 99% 97% 96%   Weight:    60 kg  Height:        Intake/Output Summary (Last 24 hours) at 01/19/2021 0823 Last data filed at 01/18/2021 1800 Gross per 24 hour  Intake 120 ml  Output 100 ml  Net 20 ml   Filed Weights   01/17/21 0500 01/18/21 0500 01/19/21 0600  Weight: 57.4 kg 59.6 kg 60 kg    Examination:   General: deconditioned and ill looking appearing.  Neurology: Awake and alert, non focal  E ENT: mild pallor, no icterus, oral mucosa moist Cardiovascular: No JVD. S1-S2 present, rhythmic, no gallops, rubs, or murmurs. Trace non pitting bilateral lower extremity edema. Left upper extremity edema.  Pulmonary: positive breath sounds bilaterally, with no wheezing, rhonchi or rales. Gastrointestinal. Abdomen soft and non tender Skin. Multiple skin ecchymosis.  Musculoskeletal: no joint deformities     Data Reviewed: I have personally reviewed following labs and imaging studies  CBC: Recent Labs  Lab 01/16/21 1909 01/17/21 0524 01/18/21 0325  WBC 6.4  6.2 5.2  NEUTROABS 4.7  --   --   HGB 10.9* 8.8* 8.5*  HCT 35.4* 28.7* 26.8*  MCV 99.2 101.8* 96.1  PLT 176 145* 347*   Basic Metabolic Panel: Recent Labs  Lab 01/16/21 1909 01/17/21 0524 01/18/21 0325 01/19/21 0447  NA 139 139 141 139  K 3.6 4.0 4.2 3.6  CL 103 110 114* 110  CO2 26 22 19* 23  GLUCOSE 92 79 145* 116*  BUN 10 8 9 14   CREATININE 0.78 0.59 0.62 0.76  CALCIUM 8.2* 6.8* 7.2* 7.8*  MG  --  1.5* 2.2  --    GFR: Estimated Creatinine Clearance: 46.8 mL/min (by C-G formula based on SCr of 0.76 mg/dL). Liver Function Tests: Recent Labs  Lab 01/16/21 1909  AST 35  ALT 15  ALKPHOS 81  BILITOT 1.1  PROT 5.8*  ALBUMIN 2.3*   No results for input(s): LIPASE, AMYLASE in the last 168 hours. No results for input(s): AMMONIA in the last 168 hours. Coagulation Profile: No results for input(s): INR, PROTIME in the last 168 hours. Cardiac Enzymes: No results for input(s): CKTOTAL, CKMB, CKMBINDEX, TROPONINI in the last 168 hours. BNP (last 3 results)  No results for input(s): PROBNP in the last 8760 hours. HbA1C: No results for input(s): HGBA1C in the last 72 hours. CBG: No results for input(s): GLUCAP in the last 168 hours. Lipid Profile: No results for input(s): CHOL, HDL, LDLCALC, TRIG, CHOLHDL, LDLDIRECT in the last 72 hours. Thyroid Function Tests: Recent Labs    01/17/21 0256  TSH 1.974   Anemia Panel: No results for input(s): VITAMINB12, FOLATE, FERRITIN, TIBC, IRON, RETICCTPCT in the last 72 hours.    Radiology Studies: I have reviewed all of the imaging during this hospital visit personally     Scheduled Meds:  allopurinol  200 mg Oral QPM   atorvastatin  20 mg Oral Daily   Chlorhexidine Gluconate Cloth  6 each Topical Daily   diltiazem  180 mg Oral Daily   docusate sodium  100 mg Oral BID   feeding supplement  237 mL Oral BID BM   hydrocortisone sod succinate (SOLU-CORTEF) inj  50 mg Intravenous Q8H   mesalamine  0.375 g Oral  Daily   metoprolol tartrate  25 mg Oral BID   multivitamin with minerals  1 tablet Oral Daily   mupirocin ointment  1 application Nasal BID   Rivaroxaban  15 mg Oral Daily   tamoxifen  20 mg Oral Daily   thiamine  100 mg Oral BID   Continuous Infusions:  sodium chloride       LOS: 2 days        Kimarion Chery Gerome Apley, MD

## 2021-01-19 NOTE — Progress Notes (Signed)
Physical Therapy Treatment Patient Details Name: Michele Green MRN: [id number] DOB: 1941-09-18 Today's Date: 01/19/2021    History of Present Illness Michele Green is a 80 y.o. female with medical history significant for atrial fibrillation on Xarelto, history of breast cancer, CAD, history of Maze procedure and mitral valve repair, chronic pain, ulcerative colitis, and 2 L/min supplemental oxygen requirement,  presented to the emergency department for evaluation of shortness of breath and lightheadedness when sitting up.  Patient was hospitalized with COVID-19 7 weeks ago, was discharged to an SNF on 12/11/2020 but is now back at ALF and reports that she has been bedbound ever since the hospital discharge and has now been able to even sit up in bed without assistance.  She has been requiring 2 L/min of supplemental oxygen since the hospital discharge    PT Comments    Pt is cooperative, pleasantly confused but able to follow commands. Pt incontinent of stool in bed, incr difficulty of clean up d/t purewick.  Pt requiring decr assist on second standing trial. Continue to recommend SNF;  Activity not progressed beyond transfer d/t incr HR 128, SPO2=89% on RA, replaced at 2L.   Follow Up Recommendations  SNF     Equipment Recommendations  None recommended by PT    Recommendations for Other Services       Precautions / Restrictions Precautions Precautions: Fall Precaution Comments: 2L O2,  monitor Restrictions Weight Bearing Restrictions: No Other Position/Activity Restrictions: WBAT    Mobility  Bed Mobility Overal bed mobility: Needs Assistance Bed Mobility: Rolling;Supine to Sit Rolling: Min guard   Supine to sit: Mod assist Sit to supine: Min guard   General bed mobility comments: mod assist to elevate trunk. cues to self assist    Transfers Overall transfer level: Needs assistance Equipment used: Rolling walker (2 wheeled) Transfers: Sit to/from Stand Sit to  Stand: Mod assist;Min assist;+2 physical assistance;+2 safety/equipment Stand pivot transfers: Min assist;+2 safety/equipment       General transfer comment: cues for hand placement, repeated sit<> stand for pericare. pt with incr effort/decr assist on second trial.  Ambulation/Gait                 Stairs             Wheelchair Mobility    Modified Rankin (Stroke Patients Only)       Balance Overall balance assessment: Needs assistance   Sitting balance-Leahy Scale: Fair (fair to poor, posterior LOB)       Standing balance-Leahy Scale: Poor Standing balance comment: Reliant on RW and external support                            Cognition Arousal/Alertness: Awake/alert Behavior During Therapy: Anxious;Impulsive Overall Cognitive Status: No family/caregiver present to determine baseline cognitive functioning                                 General Comments: pleasantly confused, follows commands consistently      Exercises      General Comments        Pertinent Vitals/Pain Pain Assessment: No/denies pain    Home Living Family/patient expects to be discharged to:: Skilled nursing facility   Available Help at Discharge:  (ALF staff) Type of Home: Assisted living Home Access: Level entry   Home Layout: One level Home Equipment: Walker - 2 wheels;Walker - 4  wheels;Wheelchair - manual;Shower seat;Grab bars - toilet Additional Comments: pt was in independent home at [facility] prior to [place] admission, then went to Short Term-SNF , then recently to ALF at [facility] only for 1-2 days and did not do well.    Prior Function Level of Independence: Independent with assistive device(s)      Comments: Pt stated in ST-SNF she was using RW and ambulting, however also states" I can' do anything now" . Unclear of patient true PLOF upon DC from SNF.   PT Goals (current goals can now be found in the care plan section) Acute Rehab  PT Goals Patient Stated Goal: Not to fall. PT Goal Formulation: With patient Time For Goal Achievement: 01/30/21 Potential to Achieve Goals: Good Progress towards PT goals: Progressing toward goals    Frequency    Min 3X/week      PT Plan Current plan remains appropriate    Co-evaluation              AM-PAC PT "6 Clicks" Mobility   Outcome Measure  Help needed turning from your back to your side while in a flat bed without using bedrails?: A Little Help needed moving from lying on your back to sitting on the side of a flat bed without using bedrails?: A Lot Help needed moving to and from a bed to a chair (including a wheelchair)?: A Lot Help needed standing up from a chair using your arms (e.g., wheelchair or bedside chair)?: A Lot Help needed to walk in hospital room?: A Lot Help needed climbing 3-5 steps with a railing? : A Lot 6 Click Score: 13    End of Session Equipment Utilized During Treatment: Gait belt Activity Tolerance: Patient limited by fatigue Patient left: in chair;with call bell/phone within reach;with chair alarm set   PT Visit Diagnosis: Muscle weakness (generalized) (M62.81)     Time: 7017-7939 PT Time Calculation (min) (ACUTE ONLY): 25 min  Charges:  $Therapeutic Activity: 23-37 mins                     Baxter Flattery, PT  Acute Rehab Dept ([place]) [phone] Pager [phone]  01/19/2021    [facility] 01/19/2021, 1:51 PM

## 2021-01-19 NOTE — Plan of Care (Signed)
  Problem: Clinical Measurements: Goal: Respiratory complications will improve Outcome: Progressing   Problem: Nutrition: Goal: Adequate nutrition will be maintained Outcome: Progressing   Problem: Coping: Goal: Level of anxiety will decrease Outcome: Progressing   Problem: Elimination: Goal: Will not experience complications related to bowel motility Outcome: Progressing   Problem: Pain Managment: Goal: General experience of comfort will improve Outcome: Progressing

## 2021-01-19 NOTE — Evaluation (Signed)
Occupational Therapy Evaluation Patient Details Name: Michele Green MRN: [id number] DOB: 1941-09-28 Today's Date: 01/19/2021    History of Present Illness Michele Green is a 80 y.o. female with medical history significant for atrial fibrillation on Xarelto, history of breast cancer, CAD, history of Maze procedure and mitral valve repair, chronic pain, ulcerative colitis, and 2 L/min supplemental oxygen requirement, now presenting to the emergency department for evaluation of shortness of breath and lightheadedness when sitting up.  Patient was hospitalized with COVID-19 7 weeks ago, was discharged to an SNF on 12/11/2020 but is now back at ALF and reports that she has been bedbound ever since the hospital discharge and has now been able to even sit up in bed without assistance.  She has been requiring 2 L/min of supplemental oxygen since the hospital discharge but felt more short of breath over the past few days and has also been lightheaded when sitting up.  She denies any chest pain, denies fevers or chills, denies abdominal pain, dysuria, or flank pain.  Reports that she was told she had a UTI a few days ago and was given Keflex but notes that she was surprised to hear this as she did not have any symptoms   Clinical Impression   Patient is currently requiring assistance with ADLs including total assist with toileting, total assist with LE dressing, maximum assist with LE bathing, and minimal assist with grooming and UE dressing, all of which is below patient's typical baseline of being Modified independent prior to Covid in January.  During this evaluation, patient was limited by generalized weakness, confusion/anxiety/panic when OOB, impaired activity tolerance, and fragile skin with multiple skin tears and bruises noted to BLEs, all of which has the potential to impact patient's safety and independence during functional mobility, as well as performance for ADLs. [place]  "6-clicks" Daily Activity Inpatient Short Form score of 14/24 indicates 59.67% ADL impairment this session. Patient recently d/c from a SNF and moved to [facility] where pt reports she was mostly stuck in her bed and unable to get up.  She does not recall where she went for her SNF.   Patient demonstrates fair rehab potential, and should benefit from continued skilled occupational therapy services while in acute care to maximize safety, independence and quality of life at home.  Continued occupational therapy services in a SNF setting prior to return home is recommended.  ?   Follow Up Recommendations  SNF;Other (comment) (If pt refuses SNF, will need 24/7 care and [facility] OT)    Equipment Recommendations       Recommendations for Other Services       Precautions / Restrictions Precautions Precautions: Fall Precaution Comments: Fragile skin. Restrictions Weight Bearing Restrictions: No      Mobility Bed Mobility Overal bed mobility: Needs Assistance Bed Mobility: Sit to Supine;Rolling Rolling: Min guard     Sit to supine: Min guard   General bed mobility comments: Pt using bed rails and able to control descent of trunk with Min guard needed to fully raise LEs to bed. Patient Response: Anxious;Impulsive  Transfers Overall transfer level: Needs assistance Equipment used: Rolling walker (2 wheeled) Transfers: Sit to/from [facility] Sit to Stand: Mod assist Stand pivot transfers: Mod assist       General transfer comment: Pt with poor safety awareness and becomes very anxoous/paniced when standing. Needs reassurance and soothing cues. Pt not following instructions for pivot and required multimodal cues to reinforce with need  of moderate assist to pivot from [facility] to EOB with RW. Pt attempted to sit too far from EOB and required moderate assist of 2 people to make it safety to EOB.    Balance Overall balance assessment: Needs assistance   Sitting  balance-Leahy Scale: Fair       Standing balance-Leahy Scale: Poor Standing balance comment: Reliant on RW and external support                           ADL either performed or assessed with clinical judgement   ADL Overall ADL's : Needs assistance/impaired Eating/Feeding: Set up;Bed level   Grooming: Set up;Bed level Grooming Details (indicate cue type and reason): Pt refused chair after fatigue from being on bedside commode. Upper Body Bathing: Minimal assistance;Sitting   Lower Body Bathing: Maximal assistance;Sit to/from stand;Sitting/lateral leans;+2 for physical assistance   Upper Body Dressing : Minimal assistance;Sitting   Lower Body Dressing: Sitting/lateral leans;Sit to/from stand;Maximal assistance;Bed level   Toilet Transfer: BSC;Moderate assistance Toilet Transfer Details (indicate cue type and reason): Pt stood from [facility] x 2 reps due to need to sit during hygiene secondary to fatigue. HR: 130 after intial stand. Moderate assist needed with each stand. Pt juts feet anteriorly just before standing with each rep despite cues to pull feet back and attempts to block feet in place. Toileting- Clothing Manipulation and Hygiene: +2 for physical assistance;Total assistance;Sit to/from stand Toileting - Clothing Manipulation Details (indicate cue type and reason): Pt reported total assist peri care with 2nd person assisting with standing balance while pt held RW.     Functional mobility during ADLs: Moderate assistance;Cueing for safety;Cueing for sequencing;Rolling walker       Vision Baseline Vision/History: Wears glasses Wears Glasses: At all times Patient Visual Report: No change from baseline       Perception     Praxis      Pertinent Vitals/Pain Pain Assessment: No/denies pain     Hand Dominance Right   Extremity/Trunk Assessment Upper Extremity Assessment Upper Extremity Assessment: Generalized weakness   Lower Extremity Assessment Lower  Extremity Assessment: Generalized weakness   Cervical / Trunk Assessment Cervical / Trunk Assessment: Kyphotic (Stooped in standing)   Communication Communication Communication: No difficulties   Cognition Arousal/Alertness: Awake/alert Behavior During Therapy: Anxious;Impulsive Overall Cognitive Status: No family/caregiver present to determine baseline cognitive functioning                                 General Comments: Pt has waxing and waning orientation. Becomes confused, then can reoriente with cues. Pt able to state name, DOB, place, month and year.Admits to not know why she was in the hospital. Pt does become anxious which causes her to move impulsively and unsafely. Needs firm 1-step commands for safety when up.   General Comments       Exercises     Shoulder Instructions      Home Living Family/patient expects to be discharged to:: Skilled nursing facility   Available Help at Discharge:  (ALF staff) Type of Home: Assisted living Home Access: Level entry     Home Layout: One level     Bathroom Shower/Tub: Occupational psychologist: Handicapped height     Home Equipment: Environmental consultant - 2 wheels;Walker - 4 wheels;Wheelchair - manual;Shower seat;Grab bars - toilet   Additional Comments: pt was in independent home at [facility] prior  to [place] admission, then went to Short Term-SNF , then recently to ALF at [facility] only for 1-2 days and did not do well.      Prior Functioning/Environment Level of Independence: Independent with assistive device(s)        Comments: Pt stated in ST-SNF she was using RW and ambulting, however also states" I can' do anything now" . Unclear of patient true PLOF upon DC from SNF.        OT Problem List: Decreased strength;Cardiopulmonary status limiting activity;Decreased cognition;Decreased activity tolerance;Decreased safety awareness;Impaired balance (sitting and/or standing);Decreased knowledge of use of  DME or AE;Decreased knowledge of precautions      OT Treatment/Interventions: Self-care/ADL training;Therapeutic exercise;Therapeutic activities;Cognitive remediation/compensation;Energy conservation;DME and/or AE instruction;Patient/family education;Balance training    OT Goals(Current goals can be found in the care plan section) Acute Rehab OT Goals Patient Stated Goal: Not to fall. OT Goal Formulation: With patient Time For Goal Achievement: 02/02/21 Potential to Achieve Goals: Fair ADL Goals Pt Will Perform Grooming: standing;with supervision Pt Will Perform Lower Body Dressing: with adaptive equipment;with set-up;sitting/lateral leans;sit to/from stand Pt Will Transfer to Toilet: bedside commode;with modified independence;ambulating Pt Will Perform Toileting - Clothing Manipulation and hygiene: with modified independence;with adaptive equipment;sitting/lateral leans;sit to/from stand Additional ADL Goal #1: Pt will follow 100% of 1-step commands and at least 75% of 2-step commands in order to increase safety when working with staff on functional mobility training.  OT Frequency: Min 2X/week   Barriers to D/C: Other (comment)  Pt did not do well at her 1-2 days at ALF after SNF. Likely needs return to SNF.       Co-evaluation              AM-PAC OT "6 Clicks" Daily Activity     Outcome Measure Help from another person eating meals?: None Help from another person taking care of personal grooming?: A Little Help from another person toileting, which includes using toliet, bedpan, or urinal?: Total Help from another person bathing (including washing, rinsing, drying)?: A Lot Help from another person to put on and taking off regular upper body clothing?: A Little Help from another person to put on and taking off regular lower body clothing?: Total 6 Click Score: 14   End of Session Equipment Utilized During Treatment: Gait belt;Rolling walker Nurse Communication: Other  (comment) (CNA in room throughout majority of OT visit for safety.)  Activity Tolerance: Patient limited by fatigue;Other (comment) (limited by anxiety and confusion) Patient left: in bed;with call bell/phone within reach;with bed alarm set  OT Visit Diagnosis: Unsteadiness on feet (R26.81);Other symptoms and signs involving cognitive function;Muscle weakness (generalized) (M62.81)                Time: 4854-6270 OT Time Calculation (min): 35 min Charges:  OT General Charges $OT Visit: 1 Visit OT Evaluation $OT Eval Low Complexity: 1 Low OT Treatments $Self Care/Home Management : 8-22 mins  Anderson Malta, OT Acute Rehab Services Office: [phone] 01/19/2021  Julien Girt 01/19/2021, 12:50 PM

## 2021-01-19 NOTE — NC FL2 (Signed)
[place] MEDICAID FL2 LEVEL OF CARE SCREENING TOOL     IDENTIFICATION  Patient Name: Michele Green Birthdate: 16-Sep-1941 Sex: female Admission Date (Current Location): 01/16/2021  [facility] and [place] Number:  Herbalist and Address:  [facility],  [place], [place]      Provider Number: [id number]  Attending Physician Name and Address:  [name],*  Relative Name and Phone Number:  Retina Bernardy (son) [phone]    Current Level of Care: Hospital Recommended Level of Care: [place] Prior Approval Number:    Date Approved/Denied:   PASRR Number: [id number]  Discharge Plan: Other (Comment) (ALF)    Current Diagnoses: Patient Active Problem List   Diagnosis Date Noted  . Hypotension 01/17/2021  . Elevated troponin 01/17/2021  . Debility 01/17/2021  . Chronic pain 01/17/2021  . Ulcerative colitis ([place]) 01/17/2021  . Chronic respiratory failure with hypoxia ([place]) 12/03/2020  . Pressure injury of skin 12/02/2020  . Pneumonia due to COVID-19 virus 12/01/2020  . T8 vertebral fracture ([place]) 09/13/2020  . Compression fracture of C-spine ([place]) 09/12/2020  . Compression fracture of thoracic spine, non-traumatic ([place]) 09/12/2020  . Goals of care, counseling/discussion   . Palliative care by specialist   . Spinal fracture of T7 vertebra ([place]) 08/17/2020  . Atrial fibrillation with RVR ([place]) 08/16/2020  . Long term (current) use of anticoagulants 08/28/2018  . Atrial flutter with rapid ventricular response ([place]) 08/14/2018  . 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 S/P Maze operation for atrial fibrillation 08/10/2018  . Chronic diastolic HF (heart failure) ([place]) 06/20/2018  . Osteoporosis 09/29/2017  . Loosening of hardware in spine ([place]) 08/23/2017  . Spinal stenosis of lumbar region 08/11/2016  . Unspecified cord compression ([place]) 08/11/2016  . Unstable burst fracture of  third lumbar vertebra ([place]) 08/11/2016  . Genetic testing 03/17/2016  . Family history of breast cancer   . Breast cancer of upper-outer quadrant of left female breast ([place]) 02/02/2016  . On amiodarone therapy 09/09/2015  . On continuous oral anticoagulation 08/22/2015  . Persistent atrial fibrillation ([place]) 08/22/2015  . Essential hypertension 11/07/2013  . Mitral regurgitation 11/07/2013    Orientation RESPIRATION BLADDER Height & Weight     Self,Time,Situation  O2 Continent Weight: 60 kg Height:  5' 4"  (162.6 cm)  BEHAVIORAL SYMPTOMS/MOOD NEUROLOGICAL BOWEL NUTRITION STATUS      Continent Diet (Heart Healthy)  AMBULATORY STATUS COMMUNICATION OF NEEDS Skin   Total Care (w/c bound) Verbally PU Stage and Appropriate Care (R heel wound-routine wound care) PU Stage 1 Dressing: Daily                     Personal Care Assistance Level of Assistance  Bathing,Feeding,Dressing Bathing Assistance: Maximum assistance Feeding assistance: Limited assistance Dressing Assistance: Maximum assistance     Functional Limitations Info  Sight,Hearing,Speech Sight Info: Impaired (eyeglasses) Hearing Info: Adequate Speech Info: Impaired (Full dentures-top/bottom)    SPECIAL CARE FACTORS FREQUENCY  PT (By licensed PT),OT (By licensed OT)     PT Frequency:  (2x week) OT Frequency:  (2x week)            Contractures Contractures Info: Not present    Additional Factors Info  Code Status,Allergies Code Status Info:  (DNR) Allergies Info:  (Shellfish;amoxicillin)           Current Medications (01/19/2021):  This is the current hospital active medication list Current  Facility-Administered Medications  Medication Dose Route Frequency Provider Last Rate Last Admin  . 0.9 %  sodium chloride infusion   Intravenous PRN Opyd, Ilene Qua, MD      . acetaminophen (TYLENOL) tablet 650 mg  650 mg Oral Q6H PRN Opyd, Ilene Qua, MD   650 mg at 01/18/21 0309   Or  . acetaminophen (TYLENOL)  suppository 650 mg  650 mg Rectal Q6H PRN Opyd, Ilene Qua, MD      . allopurinol (ZYLOPRIM) tablet 200 mg  200 mg Oral QPM Opyd, Ilene Qua, MD   200 mg at 01/19/21 1738  . atorvastatin (LIPITOR) tablet 20 mg  20 mg Oral Daily Opyd, Ilene Qua, MD   20 mg at 01/19/21 0858  . Chlorhexidine Gluconate Cloth 2 % PADS 6 each  6 each Topical Daily Opyd, Ilene Qua, MD   6 each at 01/19/21 1000  . diltiazem (CARDIZEM CD) 24 hr capsule 180 mg  180 mg Oral Daily Arrien, Jimmy Picket, MD   180 mg at 01/19/21 0857  . docusate sodium (COLACE) capsule 100 mg  100 mg Oral BID Opyd, Ilene Qua, MD   100 mg at 01/19/21 0857  . feeding supplement (ENSURE ENLIVE / ENSURE PLUS) liquid 237 mL  237 mL Oral BID BM Opyd, Ilene Qua, MD   237 mL at 01/19/21 0859  . hydrocortisone sodium succinate (SOLU-CORTEF) 100 MG injection 50 mg  50 mg Intravenous Q8H Arrien, Jimmy Picket, MD   50 mg at 01/19/21 1738  . mesalamine (APRISO) 24 hr capsule 0.375 g  0.375 g Oral Daily Opyd, Ilene Qua, MD      . metoprolol tartrate (LOPRESSOR) injection 5 mg  5 mg Intravenous Q4H PRN Arrien, Jimmy Picket, MD      . metoprolol tartrate (LOPRESSOR) tablet 50 mg  50 mg Oral BID Arrien, Jimmy Picket, MD      . midodrine (PROAMATINE) tablet 5 mg  5 mg Oral TID WC Arrien, Jimmy Picket, MD   5 mg at 01/19/21 1617  . multivitamin with minerals tablet 1 tablet  1 tablet Oral Daily Opyd, Ilene Qua, MD   1 tablet at 01/19/21 0857  . mupirocin ointment (BACTROBAN) 2 % 1 application  1 application Nasal BID Arrien, Jimmy Picket, MD   1 application at 75/44/92 [phone]  . ondansetron (ZOFRAN) tablet 4 mg  4 mg Oral Q6H PRN Opyd, Ilene Qua, MD       Or  . ondansetron (ZOFRAN) injection 4 mg  4 mg Intravenous Q6H PRN Opyd, Ilene Qua, MD      . polyethylene glycol (MIRALAX / GLYCOLAX) packet 17 g  17 g Oral Daily PRN Opyd, Ilene Qua, MD      . Rivaroxaban (XARELTO) tablet 15 mg  15 mg Oral Daily Opyd, Ilene Qua, MD   15 mg at 01/19/21 0857  .  tamoxifen (NOLVADEX) tablet 20 mg  20 mg Oral Daily Opyd, Ilene Qua, MD   20 mg at 01/19/21 0856  . thiamine tablet 100 mg  100 mg Oral BID [name], MD   100 mg at 01/19/21 7121     Discharge Medications: Please see discharge summary for a list of discharge medications.  Relevant Imaging Results:  Relevant Lab Results:   Additional Information ss#[id number]  Batul Diego, Juliann Pulse, [place]

## 2021-01-19 NOTE — TOC Initial Note (Signed)
Transition of Care [facility]) - Initial/Assessment Note    Patient Details  Name: Michele Green MRN: [id number] Date of Birth: 01-31-1941  [facility] CM/SW Contact:    Dessa Phi, RN Phone Number: 01/19/2021, 5:54 PM  Clinical Narrative: From [facility]-per son [name] d/c plan to return-PT/OT recc SNF-faxed fl2 to [facility] fax#[phone]-await response if patient able to return.                  Expected Discharge Plan: Assisted Living Barriers to Discharge: Continued Medical Work up   Patient Goals and CMS Choice Patient states their goals for this hospitalization and ongoing recovery are:: per son return back to ALF CMS Medicare.gov Compare Post Acute Care list provided to:: Patient Represenative (must comment) Choice offered to / list presented to : Adult Children  Expected Discharge Plan and Services Expected Discharge Plan: Assisted Living   Discharge Planning Services: CM Consult Post Acute Care Choice:  (ALF) Living arrangements for the past 2 months: [place]                                      Prior Living Arrangements/Services Living arrangements for the past 2 months: [place] Lives with:: Facility Resident Patient language and need for interpreter reviewed:: Yes Do you feel safe going back to the place where you live?: Yes      Need for Family Participation in Patient Care: No (Comment) Care giver support system in place?: Yes (comment)   Criminal Activity/Legal Involvement Pertinent to Current Situation/Hospitalization: No - Comment as needed  Activities of Daily Living Home Assistive Devices/Equipment: None ADL Screening (condition at time of admission) Patient's cognitive ability adequate to safely complete daily activities?: Yes Is the patient deaf or have difficulty hearing?: No Does the patient have difficulty seeing, even when wearing glasses/contacts?: No Does the patient have  difficulty concentrating, remembering, or making decisions?: No Patient able to express need for assistance with ADLs?: Yes Does the patient have difficulty dressing or bathing?: Yes Independently performs ADLs?: Yes (appropriate for developmental age) Does the patient have difficulty walking or climbing stairs?: Yes Weakness of Legs: Both Weakness of Arms/Hands: None  Permission Sought/Granted Permission sought to share information with : Case Manager Permission granted to share information with : Yes, Verbal Permission Granted  Share Information with NAME: Case Manager           Emotional Assessment Appearance:: Appears stated age   Affect (typically observed): Accepting Orientation: : Oriented to Self,Oriented to Place,Oriented to  Time Alcohol / Substance Use: Not Applicable Psych Involvement: No (comment)  Admission diagnosis:  Shortness of breath [R06.02] Hypotension [I95.9] Patient Active Problem List   Diagnosis Date Noted  . Hypotension 01/17/2021  . Elevated troponin 01/17/2021  . Debility 01/17/2021  . Chronic pain 01/17/2021  . Ulcerative colitis ([place]) 01/17/2021  . Chronic respiratory failure with hypoxia ([place]) 12/03/2020  . Pressure injury of skin 12/02/2020  . Pneumonia due to COVID-19 virus 12/01/2020  . T8 vertebral fracture ([place]) 09/13/2020  . Compression fracture of C-spine ([place]) 09/12/2020  . Compression fracture of thoracic spine, non-traumatic ([place]) 09/12/2020  . Goals of care, counseling/discussion   . Palliative care by specialist   . Spinal fracture of T7 vertebra ([place]) 08/17/2020  . Atrial fibrillation with RVR ([place]) 08/16/2020  . Long term (current) use of anticoagulants 08/28/2018  . Atrial flutter  with rapid ventricular response ([place]) 08/14/2018  . 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 S/P Maze operation for atrial fibrillation 08/10/2018  . Chronic diastolic HF (heart failure) ([place]) 06/20/2018  . Osteoporosis  09/29/2017  . Loosening of hardware in spine ([place]) 08/23/2017  . Spinal stenosis of lumbar region 08/11/2016  . Unspecified cord compression ([place]) 08/11/2016  . Unstable burst fracture of third lumbar vertebra ([place]) 08/11/2016  . Genetic testing 03/17/2016  . Family history of breast cancer   . Breast cancer of upper-outer quadrant of left female breast ([place]) 02/02/2016  . On amiodarone therapy 09/09/2015  . On continuous oral anticoagulation 08/22/2015  . Persistent atrial fibrillation ([place]) 08/22/2015  . Essential hypertension 11/07/2013  . Mitral regurgitation 11/07/2013   PCP:  [name], MD Pharmacy:   [id number] [place], [id number]: [phone]: [phone]    Social Determinants of Health (SDOH) Interventions    Readmission Risk Interventions Readmission Risk Prevention Plan 01/19/2021  Medication Review ([address] Complete  PCP or Specialist appointment within 3-5 days of discharge Complete  HRI or Home Care Consult Complete  SW Recovery Care/Counseling Consult Complete  Palliative Care Screening Complete  Skilled Nursing Facility Complete  Some recent data might be hidden

## 2021-01-19 NOTE — [place] FL2 (Signed)
[place] MEDICAID FL2 LEVEL OF CARE SCREENING TOOL     IDENTIFICATION  Patient Name: Michele Green Birthdate: August 01, 1941 Sex: female Admission Date (Current Location): 01/16/2021  [facility] and [place] Number:  Herbalist and Address:  [facility],  [place], [place]      Provider Number: [id number]  Attending Physician Name and Address:  [name],*  Relative Name and Phone Number:  Leauna Sharber (son) [phone]    Current Level of Care: Hospital Recommended Level of Care: [place] Prior Approval Number:    Date Approved/Denied:   PASRR Number: [id number]  Discharge Plan: Other (Comment) (ALF)    Current Diagnoses: Patient Active Problem List   Diagnosis Date Noted  . Hypotension 01/17/2021  . Elevated troponin 01/17/2021  . Debility 01/17/2021  . Chronic pain 01/17/2021  . Ulcerative colitis ([place]) 01/17/2021  . Chronic respiratory failure with hypoxia ([place]) 12/03/2020  . Pressure injury of skin 12/02/2020  . Pneumonia due to COVID-19 virus 12/01/2020  . T8 vertebral fracture ([place]) 09/13/2020  . Compression fracture of C-spine ([place]) 09/12/2020  . Compression fracture of thoracic spine, non-traumatic ([place]) 09/12/2020  . Goals of care, counseling/discussion   . Palliative care by specialist   . Spinal fracture of T7 vertebra ([place]) 08/17/2020  . Atrial fibrillation with RVR ([place]) 08/16/2020  . Long term (current) use of anticoagulants 08/28/2018  . Atrial flutter with rapid ventricular response ([place]) 08/14/2018  . 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 S/P Maze operation for atrial fibrillation 08/10/2018  . Chronic diastolic HF (heart failure) ([place]) 06/20/2018  . Osteoporosis 09/29/2017  . Loosening of hardware in spine ([place]) 08/23/2017  . Spinal stenosis of lumbar region 08/11/2016  . Unspecified cord compression ([place]) 08/11/2016  . Unstable burst fracture of  third lumbar vertebra ([place]) 08/11/2016  . Genetic testing 03/17/2016  . Family history of breast cancer   . Breast cancer of upper-outer quadrant of left female breast ([place]) 02/02/2016  . On amiodarone therapy 09/09/2015  . On continuous oral anticoagulation 08/22/2015  . Persistent atrial fibrillation ([place]) 08/22/2015  . Essential hypertension 11/07/2013  . Mitral regurgitation 11/07/2013    Orientation RESPIRATION BLADDER Height & Weight        O2 Continent Weight: 60 kg Height:  5' 4"  (162.6 cm)  BEHAVIORAL SYMPTOMS/MOOD NEUROLOGICAL BOWEL NUTRITION STATUS      Continent Diet (Heart Healthy)  AMBULATORY STATUS COMMUNICATION OF NEEDS Skin   Total Care (w/c bound) Verbally PU Stage and Appropriate Care (R heel wound-routine wound care) PU Stage 1 Dressing: Daily                     Personal Care Assistance Level of Assistance  Bathing,Feeding,Dressing Bathing Assistance: Maximum assistance Feeding assistance: Limited assistance Dressing Assistance: Maximum assistance     Functional Limitations Info  Sight,Hearing,Speech Sight Info: Impaired (eyeglasses) Hearing Info: Adequate Speech Info: Impaired (Full dentures-top/bottom)    SPECIAL CARE FACTORS FREQUENCY  PT (By licensed PT),OT (By licensed OT)     PT Frequency:  (2x week) OT Frequency:  (2x week)            Contractures Contractures Info: Not present    Additional Factors Info  Code Status,Allergies Code Status Info:  (DNR) Allergies Info:  (Shellfish;amoxicillin)           Current Medications (01/19/2021):  This is the current hospital active medication list  Current Facility-Administered Medications  Medication Dose Route Frequency Provider Last Rate Last Admin  . 0.9 %  sodium chloride infusion   Intravenous PRN Opyd, Ilene Qua, MD      . acetaminophen (TYLENOL) tablet 650 mg  650 mg Oral Q6H PRN Opyd, Ilene Qua, MD   650 mg at 01/18/21 0309   Or  . acetaminophen (TYLENOL) suppository 650 mg   650 mg Rectal Q6H PRN Opyd, Ilene Qua, MD      . allopurinol (ZYLOPRIM) tablet 200 mg  200 mg Oral QPM Opyd, Ilene Qua, MD   200 mg at 01/19/21 1738  . atorvastatin (LIPITOR) tablet 20 mg  20 mg Oral Daily Opyd, Ilene Qua, MD   20 mg at 01/19/21 0858  . Chlorhexidine Gluconate Cloth 2 % PADS 6 each  6 each Topical Daily Opyd, Ilene Qua, MD   6 each at 01/19/21 1000  . diltiazem (CARDIZEM CD) 24 hr capsule 180 mg  180 mg Oral Daily Arrien, Jimmy Picket, MD   180 mg at 01/19/21 0857  . docusate sodium (COLACE) capsule 100 mg  100 mg Oral BID Opyd, Ilene Qua, MD   100 mg at 01/19/21 0857  . feeding supplement (ENSURE ENLIVE / ENSURE PLUS) liquid 237 mL  237 mL Oral BID BM Opyd, Ilene Qua, MD   237 mL at 01/19/21 0859  . hydrocortisone sodium succinate (SOLU-CORTEF) 100 MG injection 50 mg  50 mg Intravenous Q8H Arrien, Jimmy Picket, MD   50 mg at 01/19/21 1738  . mesalamine (APRISO) 24 hr capsule 0.375 g  0.375 g Oral Daily Opyd, Ilene Qua, MD      . metoprolol tartrate (LOPRESSOR) injection 5 mg  5 mg Intravenous Q4H PRN Arrien, Jimmy Picket, MD      . metoprolol tartrate (LOPRESSOR) tablet 50 mg  50 mg Oral BID Arrien, Jimmy Picket, MD      . midodrine (PROAMATINE) tablet 5 mg  5 mg Oral TID WC Arrien, Jimmy Picket, MD   5 mg at 01/19/21 1617  . multivitamin with minerals tablet 1 tablet  1 tablet Oral Daily Opyd, Ilene Qua, MD   1 tablet at 01/19/21 0857  . mupirocin ointment (BACTROBAN) 2 % 1 application  1 application Nasal BID Arrien, Jimmy Picket, MD   1 application at 02/40/97 [phone]  . ondansetron (ZOFRAN) tablet 4 mg  4 mg Oral Q6H PRN Opyd, Ilene Qua, MD       Or  . ondansetron (ZOFRAN) injection 4 mg  4 mg Intravenous Q6H PRN Opyd, Ilene Qua, MD      . polyethylene glycol (MIRALAX / GLYCOLAX) packet 17 g  17 g Oral Daily PRN Opyd, Ilene Qua, MD      . Rivaroxaban (XARELTO) tablet 15 mg  15 mg Oral Daily Opyd, Ilene Qua, MD   15 mg at 01/19/21 0857  . tamoxifen (NOLVADEX)  tablet 20 mg  20 mg Oral Daily Opyd, Ilene Qua, MD   20 mg at 01/19/21 0856  . thiamine tablet 100 mg  100 mg Oral BID [name], MD   100 mg at 01/19/21 9924     Discharge Medications: Please see discharge summary for a list of discharge medications.  Relevant Imaging Results:  Relevant Lab Results:   Additional Information ss#[phone]  Doreatha Offer, Juliann Pulse, [place]

## 2021-01-19 NOTE — Progress Notes (Addendum)
Patient refused to have wound care done despite encouragement. Patient later consented and wound care was done. No signs of distress or discomfort noted,.

## 2021-01-19 NOTE — [place] FL2 (Signed)
[place] MEDICAID FL2 LEVEL OF CARE SCREENING TOOL     IDENTIFICATION  Patient Name: Michele Green Birthdate: 04/07/1941 Sex: female Admission Date (Current Location): 01/16/2021  [facility] and [place] Number:  Herbalist and Address:  [facility],  [place], [place]      Provider Number: [id number]  Attending Physician Name and Address:  [name],*  Relative Name and Phone Number:  [name] (son) [phone]    Current Level of Care: Hospital Recommended Level of Care: [place] Prior Approval Number:    Date Approved/Denied:   PASRR Number: [id number]  Discharge Plan: Other (Comment) (ALF)    Current Diagnoses: Patient Active Problem List   Diagnosis Date Noted  . Hypotension 01/17/2021  . Elevated troponin 01/17/2021  . Debility 01/17/2021  . Chronic pain 01/17/2021  . Ulcerative colitis ([place]) 01/17/2021  . Chronic respiratory failure with hypoxia ([place]) 12/03/2020  . Pressure injury of skin 12/02/2020  . Pneumonia due to COVID-19 virus 12/01/2020  . T8 vertebral fracture ([place]) 09/13/2020  . Compression fracture of C-spine ([place]) 09/12/2020  . Compression fracture of thoracic spine, non-traumatic ([place]) 09/12/2020  . Goals of care, counseling/discussion   . Palliative care by specialist   . Spinal fracture of T7 vertebra ([place]) 08/17/2020  . Atrial fibrillation with RVR ([place]) 08/16/2020  . Long term (current) use of anticoagulants 08/28/2018  . Atrial flutter with rapid ventricular response ([place]) 08/14/2018  . 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 S/P Maze operation for atrial fibrillation 08/10/2018  . Chronic diastolic HF (heart failure) ([place]) 06/20/2018  . Osteoporosis 09/29/2017  . Loosening of hardware in spine ([place]) 08/23/2017  . Spinal stenosis of lumbar region 08/11/2016  . Unspecified cord compression ([place]) 08/11/2016  . Unstable burst fracture of  third lumbar vertebra ([place]) 08/11/2016  . Genetic testing 03/17/2016  . Family history of breast cancer   . Breast cancer of upper-outer quadrant of left female breast ([place]) 02/02/2016  . On amiodarone therapy 09/09/2015  . On continuous oral anticoagulation 08/22/2015  . Persistent atrial fibrillation ([place]) 08/22/2015  . Essential hypertension 11/07/2013  . Mitral regurgitation 11/07/2013    Orientation RESPIRATION BLADDER Height & Weight     Self,Time,Situation  O2 Continent Weight: 60 kg Height:  5' 4"  (162.6 cm)  BEHAVIORAL SYMPTOMS/MOOD NEUROLOGICAL BOWEL NUTRITION STATUS      Continent Diet (Heart Healthy)  AMBULATORY STATUS COMMUNICATION OF NEEDS Skin   Total Care (w/c bound) Verbally PU Stage and Appropriate Care (R heel wound-routine wound care) PU Stage 1 Dressing: Daily                     Personal Care Assistance Level of Assistance  Bathing,Feeding,Dressing Bathing Assistance: Maximum assistance Feeding assistance: Limited assistance Dressing Assistance: Maximum assistance     Functional Limitations Info  Sight,Hearing,Speech Sight Info: Impaired (eyeglasses) Hearing Info: Adequate Speech Info: Impaired (Full dentures-top/bottom)    SPECIAL CARE FACTORS FREQUENCY  PT (By licensed PT),OT (By licensed OT)     PT Frequency:  (2x week) OT Frequency:  (2x week)            Contractures Contractures Info: Not present    Additional Factors Info  Code Status,Allergies Code Status Info:  (DNR) Allergies Info:  (Shellfish;amoxicillin)           Current Medications (01/19/2021):  This is the current hospital active medication list Current  Facility-Administered Medications  Medication Dose Route Frequency Provider Last Rate Last Admin  . 0.9 %  sodium chloride infusion   Intravenous PRN Opyd, Ilene Qua, MD      . acetaminophen (TYLENOL) tablet 650 mg  650 mg Oral Q6H PRN Opyd, Ilene Qua, MD   650 mg at 01/18/21 0309   Or  . acetaminophen (TYLENOL)  suppository 650 mg  650 mg Rectal Q6H PRN Opyd, Ilene Qua, MD      . allopurinol (ZYLOPRIM) tablet 200 mg  200 mg Oral QPM Opyd, Ilene Qua, MD   200 mg at 01/19/21 1738  . atorvastatin (LIPITOR) tablet 20 mg  20 mg Oral Daily Opyd, Ilene Qua, MD   20 mg at 01/19/21 0858  . Chlorhexidine Gluconate Cloth 2 % PADS 6 each  6 each Topical Daily Opyd, Ilene Qua, MD   6 each at 01/19/21 1000  . diltiazem (CARDIZEM CD) 24 hr capsule 180 mg  180 mg Oral Daily Arrien, Jimmy Picket, MD   180 mg at 01/19/21 0857  . docusate sodium (COLACE) capsule 100 mg  100 mg Oral BID Opyd, Ilene Qua, MD   100 mg at 01/19/21 0857  . feeding supplement (ENSURE ENLIVE / ENSURE PLUS) liquid 237 mL  237 mL Oral BID BM Opyd, Ilene Qua, MD   237 mL at 01/19/21 0859  . hydrocortisone sodium succinate (SOLU-CORTEF) 100 MG injection 50 mg  50 mg Intravenous Q8H Arrien, Jimmy Picket, MD   50 mg at 01/19/21 1738  . mesalamine (APRISO) 24 hr capsule 0.375 g  0.375 g Oral Daily Opyd, Ilene Qua, MD      . metoprolol tartrate (LOPRESSOR) injection 5 mg  5 mg Intravenous Q4H PRN Arrien, Jimmy Picket, MD      . metoprolol tartrate (LOPRESSOR) tablet 50 mg  50 mg Oral BID Arrien, Jimmy Picket, MD      . midodrine (PROAMATINE) tablet 5 mg  5 mg Oral TID WC Arrien, Jimmy Picket, MD   5 mg at 01/19/21 1617  . multivitamin with minerals tablet 1 tablet  1 tablet Oral Daily Opyd, Ilene Qua, MD   1 tablet at 01/19/21 0857  . mupirocin ointment (BACTROBAN) 2 % 1 application  1 application Nasal BID Arrien, Jimmy Picket, MD   1 application at 62/83/15 [phone]  . ondansetron (ZOFRAN) tablet 4 mg  4 mg Oral Q6H PRN Opyd, Ilene Qua, MD       Or  . ondansetron (ZOFRAN) injection 4 mg  4 mg Intravenous Q6H PRN Opyd, Ilene Qua, MD      . polyethylene glycol (MIRALAX / GLYCOLAX) packet 17 g  17 g Oral Daily PRN Opyd, Ilene Qua, MD      . Rivaroxaban (XARELTO) tablet 15 mg  15 mg Oral Daily Opyd, Ilene Qua, MD   15 mg at 01/19/21 0857  .  tamoxifen (NOLVADEX) tablet 20 mg  20 mg Oral Daily Opyd, Ilene Qua, MD   20 mg at 01/19/21 0856  . thiamine tablet 100 mg  100 mg Oral BID [name], MD   100 mg at 01/19/21 6073     Discharge Medications: Please see discharge summary for a list of discharge medications.  Relevant Imaging Results:  Relevant Lab Results:   Additional Information ss#[phone]  Clella Mckeel, Juliann Pulse, [place]

## 2021-01-20 DIAGNOSIS — I959 Hypotension, unspecified: Secondary | ICD-10-CM | POA: Diagnosis not present

## 2021-01-20 DIAGNOSIS — R5381 Other malaise: Secondary | ICD-10-CM | POA: Diagnosis not present

## 2021-01-20 DIAGNOSIS — I5032 Chronic diastolic (congestive) heart failure: Secondary | ICD-10-CM | POA: Diagnosis not present

## 2021-01-20 DIAGNOSIS — G8929 Other chronic pain: Secondary | ICD-10-CM | POA: Diagnosis not present

## 2021-01-20 MED ORDER — MIDODRINE HCL 5 MG PO TABS
2.5000 mg | ORAL_TABLET | Freq: Three times a day (TID) | ORAL | Status: DC
  Administered 2021-01-20 – 2021-01-21 (×3): 2.5 mg via ORAL
  Filled 2021-01-20 (×3): qty 1

## 2021-01-20 MED ORDER — METOPROLOL TARTRATE 50 MG PO TABS
75.0000 mg | ORAL_TABLET | Freq: Two times a day (BID) | ORAL | Status: DC
  Administered 2021-01-20 – 2021-01-21 (×3): 75 mg via ORAL
  Filled 2021-01-20 (×3): qty 1

## 2021-01-20 MED ORDER — PREDNISONE 20 MG PO TABS
20.0000 mg | ORAL_TABLET | Freq: Two times a day (BID) | ORAL | Status: DC
  Administered 2021-01-20 – 2021-01-21 (×3): 20 mg via ORAL
  Filled 2021-01-20 (×3): qty 1

## 2021-01-20 MED ORDER — POTASSIUM CHLORIDE CRYS ER 20 MEQ PO TBCR
40.0000 meq | EXTENDED_RELEASE_TABLET | Freq: Once | ORAL | Status: AC
  Administered 2021-01-20: 40 meq via ORAL
  Filled 2021-01-20: qty 2

## 2021-01-20 NOTE — Care Management Important Message (Signed)
Important Message  Patient Details IM Letter given to the Patient. Name: Michele Green MRN: [id number] Date of Birth: Mar 25, 1941   Medicare Important Message Given:  Yes     Kerin Salen 01/20/2021, 10:58 AM

## 2021-01-20 NOTE — Progress Notes (Addendum)
PROGRESS NOTE    Michele Green  WSF:[id number] DOB: 1941-07-22 DOA: 01/16/2021 PCP: [name], MD    Brief Narrative:  Michele Green was admitted to the hospital with the working diagnosis of hypotensiondue to adrenal insufficiency.  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 Recent hospitalization for COVID-19 viral pneumonia, discharged to skilled nursing facility 12/11/2020,and posteriorly to assisted living facility. Reported few days of worsening dyspnea despite supplemental oxygen per nasal cannula, apparently she was diagnosed with urinary tract infection and received cephalexin. On her initial physical examination her oximetry was in the upper 90s on 2 L/min per nasal cannula, heart rate 017, systolic blood pressure 49/44, 91/46. Lungs had no wheezing, rhonchi or rales, heart S1-S2 present, irregularly irregular, abdomen soft, trace bilateral lower extremity edema.  Sodium 139, potassium 3.6, chloride 103, bicarb 26, glucose 92, BUN 10, creatinine 0.78, AST 35, ALT 15, troponin I 31-30, white count 6.4, hemoglobin 10.9, hematocrit 35.4, platelets 176. SARS COVID-19 is pending.  Chest radiograph with small bilateral pleural effusions, left lower lobe interstitial infiltrate, increased lung markings at the apices bilaterally. CT chest negative for pulmonary embolism, small bilateral pleural effusions, groundglass opacities predominantly at bases/left side.  EKG 111 bpm, normal axis, second-degree AV block type II, no significant ST segment or T wave changes.  Patient was placed on isotonic IV fluids (4 L isotonic saline bolus), stress dose steroids and midodrine with improvement in her blood pressure. Troponin elevation due to hypotension no ACS  pattern.  Persistent sinus tachycardia improving with AV blockade.    Assessment & Plan:   Principal Problem:   Hypotension Active Problems:   Persistent atrial fibrillation (HCC)   Chronic diastolic HF (heart failure) (HCC)   Pressure injury of skin   Chronic respiratory failure with hypoxia (HCC)   Elevated troponin   Debility   Chronic pain   Ulcerative colitis ([place])   1. Hypotensionadrenal insuficiency.Serum cortisol 11.7 inappropriate low for patient's condition.  Echocardiogram with preserved LV (60 to 96%) and RV systolic function. RSVP is 52.9 mmHg.   Her blood pressure has been 90 to 759 mmHg systolic. Decrease steroid dose to prednisone 20 mg daily.  Resumed midodrine to support blood pressure. Continue rate control.  Patient very weak and deconditioned, will need SNF. Continue with PT and OT.   2. Paroxysmal atrial fibrillation, second degree AV block.At home patient on metoprolol, diltiazem and digoxin.   HR continue to be elevated at 100 bpm, will continue to adjust AV blockade with metoprolol, increase to 75 mg po bid, at home patient was on 100 mg bid.  Continue with diltiazem 180 mg daily. Holding digoxin for now due to potential toxicity. Continue blood pressure support with midodrine.   Continue with rivaroxaban for anticoagulation.  3. Chronic diastolic heart failure/ sp mitral valve repair (2019). No clinical signs of exacerbation, or low cardiac output. Patient has peripheral pitting edema, upper and lower extremities at dependent zones.  Possible decreased oncotic pressure due to hypoproteinemia. Hold on furosemide for now, continue to hold on IV fluids.    4. Ulcerative colitis. Noactivediarrhea or abdominal pain, no signs of exacerbation. On mesalamine with good toleration.   5. Ambulatory dysfunction, chronic pain syndrome, left heal and proximal LUE skin tears.Continue with multivitamins and thiamine.  Continue with local skin care,  decreased dose of steroids.  6.  Dyslipidemia.Onatorvastatin.   7. Hx of breast cancer/ chronic disease anemia. On tamoxifen. Her cell count has been stable.   8. Hypomagnesemia/ hypokalemia, hyperchloremic non gap metabolic acidosis.stable renal function with serum cr at 0,76 with bicarbonate at 23. K 3,6, will add 40 kcl po daily.   Renal function in am. Avoid hypotension and nephrotoxic medications.   Patient continue to be at high risk for worsening tachycardia and hypotension   Status is: Inpatient  Remains inpatient appropriate because:IV treatments appropriate due to intensity of illness or inability to take PO and Inpatient level of care appropriate due to severity of illness   Dispo: The patient is from: ALF              Anticipated d/c is to: SNF              Patient currently is not medically stable to d/c.   Difficult to place patient No   DVT prophylaxis: rivaroxaban   Code Status:    DNR  Family Communication:  No family at the bedside      Subjective: Patient is feeling better, continue to be very weak and deconditioned, no nausea or vomiting.    Objective: Vitals:   01/19/21 1356 01/19/21 2203 01/20/21 0500 01/20/21 0508  BP: 103/74 93/60  101/63  Pulse: (!) 125 93  (!) 108  Resp: 16 16  14   Temp: 97.8 F (36.6 C) 98.3 F (36.8 C)  97.7 F (36.5 C)  TempSrc: Oral Oral  Oral  SpO2: 100% 96%  92%  Weight:   61.2 kg   Height:        Intake/Output Summary (Last 24 hours) at 01/20/2021 0839 Last data filed at 01/19/2021 1300 Gross per 24 hour  Intake 100 ml  Output --  Net 100 ml   Filed Weights   01/18/21 0500 01/19/21 0600 01/20/21 0500  Weight: 59.6 kg 60 kg 61.2 kg    Examination:   General: Not in pain or dyspnea, deconditioned  Neurology: Awake and alert, non focal  E ENT: mild pallor, no icterus, oral mucosa moist Cardiovascular: No JVD. S1-S2 present, rhythmic, tachycardic with no gallops, rubs, or murmurs. ++ pitting  bilateral upper and lower extremity edema, at the dependent zones.  Pulmonary: positive breath sounds bilaterally, with no wheezing, scattered rhonchi but no rales. Gastrointestinal. Abdomen soft and non tender Skin. Multiple skin tears and ecchymosis.  Musculoskeletal: no joint deformities     Data Reviewed: I have personally reviewed following labs and imaging studies  CBC: Recent Labs  Lab 01/16/21 1909 01/17/21 0524 01/18/21 0325  WBC 6.4 6.2 5.2  NEUTROABS 4.7  --   --   HGB 10.9* 8.8* 8.5*  HCT 35.4* 28.7* 26.8*  MCV 99.2 101.8* 96.1  PLT 176 145* 034*   Basic Metabolic Panel: Recent Labs  Lab 01/16/21 1909 01/17/21 0524 01/18/21 0325 01/19/21 0447  NA 139 139 141 139  K 3.6 4.0 4.2 3.6  CL 103 110 114* 110  CO2 26 22 19* 23  GLUCOSE 92 79 145* 116*  BUN 10 8 9 14   CREATININE 0.78 0.59 0.62 0.76  CALCIUM 8.2* 6.8* 7.2* 7.8*  MG  --  1.5* 2.2  --    GFR: Estimated Creatinine Clearance: 46.8 mL/min (by C-G formula based on SCr of 0.76 mg/dL). Liver Function Tests: Recent Labs  Lab 01/16/21 1909  AST 35  ALT 15  ALKPHOS 81  BILITOT 1.1  PROT 5.8*  ALBUMIN 2.3*   No  results for input(s): LIPASE, AMYLASE in the last 168 hours. No results for input(s): AMMONIA in the last 168 hours. Coagulation Profile: No results for input(s): INR, PROTIME in the last 168 hours. Cardiac Enzymes: No results for input(s): CKTOTAL, CKMB, CKMBINDEX, TROPONINI in the last 168 hours. BNP (last 3 results) No results for input(s): PROBNP in the last 8760 hours. HbA1C: No results for input(s): HGBA1C in the last 72 hours. CBG: No results for input(s): GLUCAP in the last 168 hours. Lipid Profile: No results for input(s): CHOL, HDL, LDLCALC, TRIG, CHOLHDL, LDLDIRECT in the last 72 hours. Thyroid Function Tests: No results for input(s): TSH, T4TOTAL, FREET4, T3FREE, THYROIDAB in the last 72 hours. Anemia Panel: No results for input(s): VITAMINB12, FOLATE, FERRITIN, TIBC,  IRON, RETICCTPCT in the last 72 hours.    Radiology Studies: I have reviewed all of the imaging during this hospital visit personally     Scheduled Meds: . allopurinol  200 mg Oral QPM  . atorvastatin  20 mg Oral Daily  . Chlorhexidine Gluconate Cloth  6 each Topical Daily  . diltiazem  180 mg Oral Daily  . docusate sodium  100 mg Oral BID  . feeding supplement  237 mL Oral BID BM  . hydrocortisone sod succinate (SOLU-CORTEF) inj  50 mg Intravenous Q8H  . mesalamine  0.375 g Oral Daily  . metoprolol tartrate  75 mg Oral BID  . midodrine  5 mg Oral TID WC  . multivitamin with minerals  1 tablet Oral Daily  . mupirocin ointment  1 application Nasal BID  . Rivaroxaban  15 mg Oral Daily  . tamoxifen  20 mg Oral Daily  . thiamine  100 mg Oral BID   Continuous Infusions: . sodium chloride       LOS: 3 days        Julyana Woolverton Gerome Apley, MD

## 2021-01-20 NOTE — Progress Notes (Addendum)
Initial Nutrition Assessment  RD working remotely.  DOCUMENTATION CODES:   Not applicable  INTERVENTION:  - continue Ensure Enlive BID, each supplement provides 350 kcal and 20 grams of protein. - will order Magic Cup BID with meals, each supplement provides 290 kcal and 9 grams of protein. - complete NFPE when feasible.  NUTRITION DIAGNOSIS:   Increased nutrient needs related to acute illness,chronic illness as evidenced by estimated needs.  GOAL:   Patient will meet greater than or equal to 90% of their needs  MONITOR:   PO intake,Supplement acceptance,Labs,Weight trends  REASON FOR ASSESSMENT:   Consult Assessment of nutrition requirement/status  ASSESSMENT:   80 year old female with medical history of afib, breast cancer, CAD, mitral valve repair s/p Maze procedure, ulcerative colitis, and chronic hypoxic respiratory failure. She presented to the ED due to dyspnea and lightheadedness. Recent hospitalization for COVID-19 PNA, discharged to [facility] 12/11/20 and then to ALF. She was recently dx with UTI. CT chest showed bilateral pleural effusions, LLL interstitial infiltrate.  Per flow sheet documentation, patient has been eating 20-85% at meals over the past 3 days. She has been accepting Ensure ~50% of the time offered.   She has not been seen by a [place] RD since 08/20/20.   Weight today is 135 lb and weight on 2/26 was 126 lb. Weight today is significantly up from all weight recordings over the past 9 months.   Non-pitting edema to BLE documented in the edema section of flow sheet.   Per notes: - adrenal insufficiency - metabolic acidosis - patient is from ALF and likely plan for d/c to SNF at time of d/c   Labs reviewed; Ca: 7.8 mg/dl. Medications reviewed; 100 mg colace BID, 1 tablet multivitamin with minerals/day, 40 mEq Klor-Con x1 dose 3/1, 20 mg deltasone BID, 100 mg thiamine/day.     NUTRITION - FOCUSED PHYSICAL EXAM:  unable to complete at this  time.  Diet Order:   Diet Order            Diet Heart Room service appropriate? Yes; Fluid consistency: Thin  Diet effective now                 EDUCATION NEEDS:   Not appropriate for education at this time  Skin:  Skin Assessment: Reviewed RN Assessment  Last BM:  2/28 (type 4 x1)  Height:   Ht Readings from Last 1 Encounters:  01/17/21 5' 4"  (1.626 m)    Weight:   Wt Readings from Last 1 Encounters:  01/20/21 61.2 kg    Estimated Nutritional Needs:  Kcal:  1700-1900 kcal Protein:  80-95 grams Fluid:  >/= 1.8 L/day      Jarome Matin, MS, RD, LDN, CNSC Inpatient Clinical Dietitian RD pager # available in [facility]  After hours/weekend pager # available in [facility]

## 2021-01-20 NOTE — TOC Progression Note (Signed)
Transition of Care [facility]) - Progression Note    Patient Details  Name: Michele Green MRN: [id number] Date of Birth: 04/25/1941  [facility] CM/SW Contact  Abigale Dorow, Juliann Pulse, RN Phone Number: 01/20/2021, 12:49 PM  Clinical Narrative:  Left vm w/Debbie @ [facility]-ALF-active w/[facility] HHPT/OT-if able to return w/PT/OT recc SNF-await call back.     Expected Discharge Plan: Assisted Living Barriers to Discharge: Continued Medical Work up  Expected Discharge Plan and Services Expected Discharge Plan: Assisted Living   Discharge Planning Services: CM Consult Post Acute Care Choice:  (ALF) Living arrangements for the past 2 months: Assisted Living Facility                                       Social Determinants of Health (SDOH) Interventions    Readmission Risk Interventions Readmission Risk Prevention Plan 01/19/2021  Medication Review (RN Care Manager) Complete  PCP or Specialist appointment within 3-5 days of discharge Complete  HRI or Home Care Consult Complete  SW Recovery Care/Counseling Consult Complete  Palliative Care Screening Complete  Skilled Nursing Facility Complete  Some recent data might be hidden

## 2021-01-21 DIAGNOSIS — I5032 Chronic diastolic (congestive) heart failure: Secondary | ICD-10-CM | POA: Diagnosis not present

## 2021-01-21 DIAGNOSIS — R5381 Other malaise: Secondary | ICD-10-CM | POA: Diagnosis not present

## 2021-01-21 DIAGNOSIS — G8929 Other chronic pain: Secondary | ICD-10-CM | POA: Diagnosis not present

## 2021-01-21 DIAGNOSIS — I959 Hypotension, unspecified: Secondary | ICD-10-CM | POA: Diagnosis not present

## 2021-01-21 LAB — SARS CORONAVIRUS 2 (TAT 6-24 HRS): SARS Coronavirus 2: NEGATIVE

## 2021-01-21 LAB — BASIC METABOLIC PANEL
Anion gap: 4 — ABNORMAL LOW (ref 5–15)
BUN: 17 mg/dL (ref 8–23)
CO2: 25 mmol/L (ref 22–32)
Calcium: 8.2 mg/dL — ABNORMAL LOW (ref 8.9–10.3)
Chloride: 109 mmol/L (ref 98–111)
Creatinine, Ser: 0.58 mg/dL (ref 0.44–1.00)
GFR, Estimated: >60 mL/min (ref >60)
Glucose, Bld: 113 mg/dL — ABNORMAL HIGH (ref 70–99)
Potassium: 4.4 mmol/L (ref 3.5–5.1)
Sodium: 138 mmol/L (ref 135–145)

## 2021-01-21 MED ORDER — OXYCODONE HCL 5 MG PO TABS: 5.0000 mg | ORAL_TABLET | Freq: Four times a day (QID) | ORAL | 0 refills | Status: DC | PRN

## 2021-01-21 MED ORDER — METOPROLOL TARTRATE 75 MG PO TABS: 75.0000 mg | ORAL_TABLET | Freq: Two times a day (BID) | ORAL | 0 refills | Status: DC

## 2021-01-21 MED ORDER — THIAMINE HCL 100 MG PO TABS: 100.0000 mg | ORAL_TABLET | Freq: Every day | ORAL | 0 refills | Status: AC

## 2021-01-21 MED ORDER — PREDNISONE 10 MG PO TABS: ORAL_TABLET | ORAL | 0 refills | Status: DC

## 2021-01-21 NOTE — Progress Notes (Signed)
Report called to [place] at [facility]. IV removed. Gown placed on pt. Awaiting PTAR for transport.

## 2021-01-21 NOTE — Discharge Summary (Signed)
Physician Discharge Summary  Michele Green EGB:[id number] DOB: [birth year] DOA: 01/16/2021  PCP: [name], MD  Admit date: 01/16/2021 Discharge date: 01/21/2021  Admitted From: ALF Disposition:  ALF  Recommendations for Outpatient Follow-up and new medication changes:  1. Follow up with Dr. Brigitte Pulse in 7 days.  2. Slow taper prednisone.  3. Metoprolol increased to 75 mg po bid 4. Discontinue digoxin.  5. Follow up with cardiology in 2 weeks.  6. Discontinue fentanyl  Home Health: yes   Equipment/Devices: yes    Discharge Condition: stable  CODE STATUS: dnr   Diet recommendation: regular as tolerated.   Brief/Interim Summary: Michele Green was admitted to the hospital with the working diagnosis of hypotensiondue to steroid induced adrenal insufficiency.  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 Recent hospitalization for COVID-19 viral pneumonia, discharged to skilled nursing facility 12/11/2020,and posteriorly to assisted living facility. Reported few days of worsening dyspnea despite supplemental oxygen per nasal cannula, apparently she was diagnosed with urinary tract infection and received cephalexin. On her initial physical examination her oximetry was in the upper 90s on 2 L/min per nasal cannula, heart rate 371, systolic blood pressure 06/26, 91/46. Lungs had no wheezing, rhonchi or rales, heart S1-S2 present, irregularly irregular, abdomen soft, trace bilateral lower extremity edema.  Sodium 139, potassium 3.6, chloride 103, bicarb 26, glucose 92, BUN 10, creatinine 0.78, AST 35, ALT 15, troponin I 31-30, white count 6.4, hemoglobin 10.9, hematocrit 35.4, platelets 176. SARS COVID-19 is pending.  Chest radiograph with small bilateral pleural effusions,  left lower lobe interstitial infiltrate, increased lung markings at the apices bilaterally. CT chest negative for pulmonary embolism, small bilateral pleural effusions, groundglass opacities predominantly at bases/left side.  EKG 111 bpm, normal axis, second-degree AV block type II, no significant ST segment or T wave changes.  Patient was placed on isotonic IV fluids (4 L isotonic saline bolus), stress dose steroids and midodrine with improvement in her blood pressure. Troponin elevation due to hypotension no ACS pattern.  Persistent sinus tachycardia improving with AV blockade.   1.  Hypotension, steroid-induced adrenal insufficiency.  She was initially admitted to the progressive care unit, random cortisol was found to be 11.7, inappropriate for patient's condition. She received the stress of steroids along with midodrine with improvement of her blood pressure. Further echocardiography showed a preserved LV systolic function with 60 to 94% EF, RV systolic function preserved, RV systolic pressure 85.4 consistent with pulmonary hypertension.  Patient will continue slow taper of prednisone, midodrine has been tapered off during her hospitalization.  2.  Paroxysmal atrial fibrillation, second-degree AV block type II, sinus tachycardia.  In the past patient has been on metoprolol, diltiazem and digoxin.  Initially AV blockade was held due to hypotension.  Eventually she was resumed on diltiazem 180 mg daily and metoprolol 75 mg twice daily with good toleration.   Continue anticoagulation with rivaroxaban.   All records personally reviewed, cardiology office 08/2020, [name] was concerned about digoxin toxicity, at that point patient was on metoprolol 100 g twice daily. Per documentation patient failed attempt for DC cardioversion and antiarrhythmic therapy with amiodarone. When she was discharged in January 2022 after treatment for SARS COVID-19 viral pneumonia she was discharged on  diltiazem 180 mg daily, metoprolol 25 mg twice daily and digoxin 5 days a week.  I will recommend follow-up  with cardiology as an outpatient. Hold on digoxing   3.  Chronic diastolic heart failure, status post mitral valve repair (2019).  No clinical signs of decompensated heart failure or low cardiac output.  She does have pitting edema predominantly at the dependent zones, likely due to hypoproteinemia (decreased oncotic pressure).  Nutrition was consulted and she was placed on supplements, including multivitamins and thiamine.  4.  Ulcerative colitis.  No active diarrhea or abdominal pain, no exacerbation.  Patient will continue taking mesalamine.  5.  Amatory dysfunction, chronic pain syndrome, left heel and proximal left upper extremity skin tears. Continue local skin care, taper steroids. Resume as needed oxycodone, continue to hold on fentanyl for now.   6.  Dyslipidemia.  Continue with atorvastatin.  7.  History of breast cancer/anemia of chronic disease.  Continue tamoxifen, her cell count remained stable.  8.  Hypomagnesemia/hypokalemia/hyperchloremic nongap metabolic acidosis.  Initially patient volume resuscitated, then follow a restrictive IV fluid strategy.  She received potassium chloride and magnesium oxide with good toleration.  At her discharge sodium 138, potassium 4.4, chloride 190, bicarb 25, glucose 113, BUN 17, creatinine 0.58.     Discharge Diagnoses:  Principal Problem:   Hypotension Active Problems:   Persistent atrial fibrillation (HCC)   Chronic diastolic HF (heart failure) (HCC)   Pressure injury of skin   Chronic respiratory failure with hypoxia (HCC)   Elevated troponin   Debility   Chronic pain   Ulcerative colitis ([place])    Discharge Instructions   Allergies as of 01/21/2021      Reactions   Shellfish Allergy Itching   Amoxicillin-pot Clavulanate Diarrhea      Medication List    STOP taking these medications   cephALEXin 500 MG  capsule Commonly known as: KEFLEX   digoxin 0.125 MG tablet Commonly known as: LANOXIN   fentaNYL 12 MCG/HR Commonly known as: DURAGESIC   metoprolol succinate 25 MG 24 hr tablet Commonly known as: TOPROL-XL     TAKE these medications   acetaminophen 500 MG tablet Commonly known as: TYLENOL Take 500-1,000 mg by mouth every 6 (six) hours as needed for moderate pain or headache.   alendronate 70 MG tablet Commonly known as: FOSAMAX Take 70 mg by mouth once a week.   allopurinol 100 MG tablet Commonly known as: ZYLOPRIM Take 200 mg by mouth every evening.   atorvastatin 20 MG tablet Commonly known as: LIPITOR TAKE 1 TABLET BY MOUTH EVERY DAY   bisacodyl 10 MG suppository Commonly known as: DULCOLAX Place 1 suppository (10 mg total) rectally daily as needed for moderate constipation.   cyclobenzaprine 5 MG tablet Commonly known as: FLEXERIL Take 1 tablet (5 mg total) by mouth 3 (three) times daily as needed for muscle spasms.   diltiazem 180 MG 24 hr capsule Commonly known as: CARDIZEM CD Take 180 mg by mouth daily.   docusate sodium 100 MG capsule Commonly known as: COLACE Take 1 capsule (100 mg total) by mouth 2 (two) times daily.   feeding supplement Liqd Take 237 mLs by mouth 2 (two) times daily between meals.   hydrOXYzine 25 MG tablet Commonly known as: ATARAX/VISTARIL Take 25 mg by mouth daily.   mesalamine 0.375 g 24 hr capsule Commonly known as: APRISO Take 0.375 g by mouth daily.   Metoprolol Tartrate 75 MG Tabs Take 75 mg by mouth 2 (two) times daily. What changed:   medication strength  how much to take   multivitamin with minerals Tabs tablet Take 1  tablet by mouth daily.   oxyCODONE 5 MG immediate release tablet Commonly known as: Oxy IR/ROXICODONE Take 1 tablet (5 mg total) by mouth every 6 (six) hours as needed for severe pain.   predniSONE 10 MG tablet Commonly known as: DELTASONE Take 2 tablets daily for 7 days, then 1 tablet  daily for 7 days, then half tablet daily for 7 days, then stop.   tamoxifen 20 MG tablet Commonly known as: NOLVADEX Take 20 mg by mouth daily.   thiamine 100 MG tablet Take 1 tablet (100 mg total) by mouth daily.   Xarelto 15 MG Tabs tablet Generic drug: Rivaroxaban TAKE 1 TABLET BY MOUTH  DAILY WITH SUPPER What changed:   how much to take  when to take this       Follow-up Information    Home, [facility] Follow up.   Specialty: Home Health Services Why: [facility] physical therapy/occupational therapy Contact information: [address] [place] [address] [phone]              Allergies  Allergen Reactions  . Shellfish Allergy Itching  . Amoxicillin-Pot Clavulanate Diarrhea        Procedures/Studies: CT Angio Chest PE W and/or Wo Contrast  Result Date: 01/16/2021 CLINICAL DATA:  Shortness of breath EXAM: CT ANGIOGRAPHY CHEST WITH CONTRAST TECHNIQUE: Multidetector CT imaging of the chest was performed using the standard protocol during bolus administration of intravenous contrast. Multiplanar CT image reconstructions and MIPs were obtained to evaluate the vascular anatomy. CONTRAST:  138m OMNIPAQUE IOHEXOL 350 MG/ML SOLN COMPARISON:  None. FINDINGS: Cardiovascular: There is a optimal opacification of the pulmonary arteries. There is no central,segmental, or subsegmental filling defects within the pulmonary arteries. There is moderate cardiomegaly present. No pericardial effusion or thickening. No evidence right heart strain. There is normal three-vessel brachiocephalic anatomy without proximal stenosis. Scattered aortic atherosclerosis is seen. Mediastinum/Nodes: No hilar, mediastinal, or axillary adenopathy. Thyroid gland, trachea, and esophagus demonstrate no significant findings. Lungs/Pleura: Small bilateral pleural effusions are present, right greater than left. Interlobular septal thickening is noted. There is ground-glass streaky airspace opacity seen  predominantly at both lung bases. No pneumothorax is noted. Upper Abdomen: No acute abnormalities present in the visualized portions of the upper abdomen. Contrast reflux seen within the IVC. Musculoskeletal: No chest wall abnormality. No acute or significant osseous findings. Overlying median sternotomy wires are present. Review of the MIP images confirms the above findings. IMPRESSION: 1. No central, segmental, or subsegmental pulmonary embolism 2. Small bilateral pleural effusions, right greater than left 3. Findings suggestive of interstitial edema 4. Ground-glass patchy airspace opacities at both lung bases which could be due to inflammatory etiology or atelectasis. 5.  Aortic Atherosclerosis (ICD10-I70.0). Electronically Signed   By: BPrudencio PairM.D.   On: 01/16/2021 22:13   DG Chest Port 1 View  Result Date: 01/16/2021 CLINICAL DATA:  Shortness of breath. EXAM: PORTABLE CHEST 1 VIEW COMPARISON:  December 01, 2020. FINDINGS: Stable cardiomediastinal silhouette. No pneumothorax is noted. Status post cardiac valve repair. Mild left basilar atelectasis, infiltrate or scarring is noted with probable small loculated left pleural effusion. Minimal right basilar subsegmental atelectasis is noted with small pleural effusion. Bony thorax is unremarkable. IMPRESSION: Mild left basilar atelectasis, infiltrate or scarring is noted with probable small loculated left pleural effusion. Minimal right basilar subsegmental atelectasis is noted with small pleural effusion. Aortic Atherosclerosis (ICD10-I70.0). Electronically Signed   By: JMarijo ConceptionM.D.   On: 01/16/2021 18:25   ECHOCARDIOGRAM  COMPLETE  Result Date: 01/17/2021    ECHOCARDIOGRAM REPORT   Patient Name:   KIERSTIN JANUARY Date of Exam: 01/17/2021 Medical Rec #:  [id number]        Height:       64.0 in Accession #:    [id number]       Weight:       126.5 lb Date of Birth:  December 31, 1940        BSA:          1.611 m Patient Age:    30 years         BP:            95/59 mmHg Patient Gender: F                HR:           80 bpm. Exam Location:  Inpatient Procedure: 2D Echo Indications:    Heart Block, 2nd degree 144.1; Hypotension  History:        Patient has prior history of Echocardiogram examinations, most                 recent 09/05/2019. Prior CABG, Arrythmias:Atrial Flutter; Risk                 Factors:Hypertension and Dyslipidemia.                  Mitral Valve: Mitral Valve Repair. Procedure Date: 08/10/2018.  Sonographer:    Mikki Santee RDCS (AE) Referring Phys: [id number] W[place]red  1. s/p Mitral Valve Repair. Procedure Date: 08/10/2018.  2. Right ventricular systolic function is normal. The right ventricular size is normal. There is moderately elevated pulmonary artery systolic pressure. The estimated right ventricular systolic pressure is 33.8 mmHg.  3. Left atrial size was severely dilated.  4. The aortic valve is tricuspid. Aortic valve regurgitation is not visualized. No aortic stenosis is present.  5. Tricuspid valve regurgitation is moderate.  6. Left ventricular ejection fraction, by estimation, is 60 to 65%. The left ventricle has normal function. The left ventricle has no regional wall motion abnormalities. Left ventricular diastolic parameters are indeterminate.  7. The inferior vena cava is normal in size with <50% respiratory variability, suggesting right atrial pressure of 8 mmHg. Comparison(s): A prior study was performed on 09/05/2019. Prior images reviewed side by side. Compared to prior increase in left atrial size, mitral regurgitation, pulmonic regurgitation, and tricuspid regurgitation. FINDINGS  Left Ventricle: Left ventricular ejection fraction, by estimation, is 60 to 65%. The left ventricle has normal function. The left ventricle has no regional wall motion abnormalities. The left ventricular internal cavity size was normal in size. There is  no left ventricular hypertrophy. Left ventricular diastolic parameters are  indeterminate. Right Ventricle: The right ventricular size is normal. No increase in right ventricular wall thickness. Right ventricular systolic function is normal. There is moderately elevated pulmonary artery systolic pressure. The tricuspid regurgitant velocity is 3.35 m/s, and with an assumed right atrial pressure of 8 mmHg, the estimated right ventricular systolic pressure is 32.9 mmHg. Left Atrium: Left atrial size was severely dilated. Right Atrium: Right atrial size was normal in size. Pericardium: There is no evidence of pericardial effusion. Mitral Valve: The mitral valve has been repaired/replaced. Moderate mitral valve regurgitation. There is a present in the mitral position. Procedure Date: 08/10/2018. 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 MV peak gradient, 19.4 mmHg. The mean mitral valve gradient is 5.5 mmHg with average heart  rate of 72 bpm. Tricuspid Valve: The tricuspid valve is normal in structure. Tricuspid valve regurgitation is moderate. Aortic Valve: The aortic valve is tricuspid. There is moderate aortic valve annular calcification. Aortic valve regurgitation is not visualized. No aortic stenosis is present. Pulmonic Valve: The pulmonic valve was grossly normal. Pulmonic valve regurgitation is mild to moderate. No evidence of pulmonic stenosis. Aorta: The aortic root is normal in size and structure and the ascending aorta was not well visualized. Venous: The inferior vena cava is normal in size with less than 50% respiratory variability, suggesting right atrial pressure of 8 mmHg. IAS/Shunts: The atrial septum is grossly normal.  LEFT VENTRICLE PLAX 2D LVIDd:         4.20 cm LVIDs:         2.60 cm LV PW:         0.90 cm LV IVS:        1.10 cm LVOT diam:     2.00 cm LV SV:         57 LV SV Index:   36 LVOT Area:     3.14 cm  RIGHT VENTRICLE TAPSE (M-mode): 0.9 cm LEFT ATRIUM              Index       RIGHT ATRIUM           Index LA diam:        4.80 cm  2.98 cm/m  RA Area:     19.70 cm LA  Vol (A2C):   134.0 ml 83.20 ml/m RA Volume:   48.70 ml  30.24 ml/m LA Vol (A4C):   136.0 ml 84.44 ml/m LA Biplane Vol: 140.0 ml 86.93 ml/m  AORTIC VALVE LVOT Vmax:   112.00 cm/s LVOT Vmean:  71.000 cm/s LVOT VTI:    0.183 m  AORTA Ao Root diam: 3.00 cm MITRAL VALVE             TRICUSPID VALVE MV Area (PHT): 3.01 cm  TR Peak grad:   44.9 mmHg MV Area VTI:   0.83 cm  TR Vmax:        335.00 cm/s MV Peak grad:  19.4 mmHg MV Mean grad:  5.5 mmHg  SHUNTS MV Vmax:       2.20 m/s  Systemic VTI:  0.18 m MV Vmean:      97.6 cm/s Systemic Diam: 2.00 cm Rudean Haskell MD Electronically signed by Rudean Haskell MD Signature Date/Time: 01/17/2021/1:15:38 PM    Final        Subjective: Patient is feeling better, no dyspnea or chest pain, no nausea or vomiting, continue to be very weak and deconditioned   Discharge Exam: Vitals:   01/21/21 0501 01/21/21 0643  BP:    Pulse:    Resp:    Temp:    SpO2: 90% 95%   Vitals:   01/20/21 2015 01/21/21 0456 01/21/21 0501 01/21/21 0643  BP: 117/66 110/88    Pulse: 60 99    Resp:      Temp: 98 F (36.7 C) 98.9 F (37.2 C)    TempSrc: Oral Oral    SpO2: 100% 100% 90% 95%  Weight:      Height:        General: Not in pain or dyspnea  Neurology: Awake and alert, non focal  E ENT: mild pallor, no icterus, oral mucosa moist Cardiovascular: No JVD. S1-S2 present, rhythmic, no gallops, rubs, or murmurs. Trace pitting upper and lower extremity edema, at  the dependent zones.  Pulmonary: positive breath sounds bilaterally, with no wheezing, rhonchi or rales. Gastrointestinal. Abdomen soft and non tender Skin. No rashes Musculoskeletal: no joint deformities   The results of significant diagnostics from this hospitalization (including imaging, microbiology, ancillary and laboratory) are listed below for reference.     Microbiology: Recent Results (from the past 240 hour(s))  SARS CORONAVIRUS 2 (TAT 6-24 HRS) Nasopharyngeal Nasopharyngeal Swab      Status: None   Collection Time: 01/17/21  4:10 AM   Specimen: Nasopharyngeal Swab  Result Value Ref Range Status   SARS Coronavirus 2 NEGATIVE NEGATIVE Final    Comment: (NOTE) SARS-CoV-2 target nucleic acids are NOT DETECTED.  The SARS-CoV-2 RNA is generally detectable in upper and lower respiratory specimens during the acute phase of infection. Negative results do not preclude SARS-CoV-2 infection, do not rule out co-infections with other pathogens, and should not be used as the sole basis for treatment or other patient management decisions. Negative results must be combined with clinical observations, patient history, and epidemiological information. The expected result is Negative.  Fact Sheet for Patients: SugarRoll.be  Fact Sheet for Healthcare Providers: https://www.woods-mathews.com/  This test is not yet approved or cleared by the [place] FDA and  has been authorized for detection and/or diagnosis of SARS-CoV-2 by FDA under an Emergency Use Authorization (EUA). This EUA will remain  in effect (meaning this test can be used) for the duration of the COVID-19 declaration under Se ction 564(b)(1) of the Act, 21 U.S.C. section 360bbb-3(b)(1), unless the authorization is terminated or revoked sooner.  Performed at [facility], [place] [address], [place], [place] [address]   MRSA PCR Screening     Status: Abnormal   Collection Time: 01/17/21  4:55 AM  Result Value Ref Range Status   MRSA by PCR POSITIVE (A) NEGATIVE Final    Comment:        The GeneXpert MRSA Assay (FDA approved for NASAL specimens only), is one component of a comprehensive MRSA colonization surveillance program. It is not intended to diagnose MRSA infection nor to guide or monitor treatment for MRSA infections. RESULT CALLED TO, READ BACK BY AND VERIFIED WITHKandis Mannan RN AT [phone] 01/17/21 MULLINS,T  Performed at [facility],  [place] [address] [place], [place] [address]   SARS CORONAVIRUS 2 (TAT 6-24 HRS) Nasopharyngeal Nasopharyngeal Swab     Status: None   Collection Time: 01/20/21  6:35 PM   Specimen: Nasopharyngeal Swab  Result Value Ref Range Status   SARS Coronavirus 2 NEGATIVE NEGATIVE Final    Comment: (NOTE) SARS-CoV-2 target nucleic acids are NOT DETECTED.  The SARS-CoV-2 RNA is generally detectable in upper and lower respiratory specimens during the acute phase of infection. Negative results do not preclude SARS-CoV-2 infection, do not rule out co-infections with other pathogens, and should not be used as the sole basis for treatment or other patient management decisions. Negative results must be combined with clinical observations, patient history, and epidemiological information. The expected result is Negative.  Fact Sheet for Patients: SugarRoll.be  Fact Sheet for Healthcare Providers: https://www.woods-mathews.com/  This test is not yet approved or cleared by the [place] FDA and  has been authorized for detection and/or diagnosis of SARS-CoV-2 by FDA under an Emergency Use Authorization (EUA). This EUA will remain  in effect (meaning this test can be used) for the duration of the COVID-19 declaration under Se ction 564(b)(1) of the Act, 21 U.S.C. section 360bbb-3(b)(1), unless the authorization  is terminated or revoked sooner.  Performed at [facility], [place] [address] [place], [place] [address]      Labs: BNP (last 3 results) Recent Labs    05/17/20 1230 08/16/20 1452 01/16/21 1909  BNP 237.4* 482.0* 768.1*   Basic Metabolic Panel: Recent Labs  Lab 01/16/21 1909 01/17/21 0524 01/18/21 0325 01/19/21 0447 01/21/21 0442  NA 139 139 141 139 138  K 3.6 4.0 4.2 3.6 4.4  CL 103 110 114* 110 109  CO2 26 22 19* 23 25  GLUCOSE 92 79 145* 116* 113*  BUN 10 8 9 14 17   CREATININE 0.78 0.59 0.62 0.76 0.58  CALCIUM 8.2*  6.8* 7.2* 7.8* 8.2*  MG  --  1.5* 2.2  --   --    Liver Function Tests: Recent Labs  Lab 01/16/21 1909  AST 35  ALT 15  ALKPHOS 81  BILITOT 1.1  PROT 5.8*  ALBUMIN 2.3*   No results for input(s): LIPASE, AMYLASE in the last 168 hours. No results for input(s): AMMONIA in the last 168 hours. CBC: Recent Labs  Lab 01/16/21 1909 01/17/21 0524 01/18/21 0325  WBC 6.4 6.2 5.2  NEUTROABS 4.7  --   --   HGB 10.9* 8.8* 8.5*  HCT 35.4* 28.7* 26.8*  MCV 99.2 101.8* 96.1  PLT 176 145* 143*   Cardiac Enzymes: No results for input(s): CKTOTAL, CKMB, CKMBINDEX, TROPONINI in the last 168 hours. BNP: Invalid input(s): POCBNP CBG: No results for input(s): GLUCAP in the last 168 hours. D-Dimer No results for input(s): DDIMER in the last 72 hours. Hgb A1c No results for input(s): HGBA1C in the last 72 hours. Lipid Profile No results for input(s): CHOL, HDL, LDLCALC, TRIG, CHOLHDL, LDLDIRECT in the last 72 hours. Thyroid function studies No results for input(s): TSH, T4TOTAL, T3FREE, THYROIDAB in the last 72 hours.  Invalid input(s): FREET3 Anemia work up No results for input(s): VITAMINB12, FOLATE, FERRITIN, TIBC, IRON, RETICCTPCT in the last 72 hours. Urinalysis    Component Value Date/Time   COLORURINE YELLOW 08/17/2020 [place] 08/17/2020 0434   LABSPEC 1.011 08/17/2020 0434   PHURINE 5.0 08/17/2020 0434   GLUCOSEU NEGATIVE 08/17/2020 0434   HGBUR NEGATIVE 08/17/2020 0434   BILIRUBINUR NEGATIVE 08/17/2020 0434   [place] 08/17/2020 0434   PROTEINUR NEGATIVE 08/17/2020 0434   NITRITE NEGATIVE 08/17/2020 0434   LEUKOCYTESUR NEGATIVE 08/17/2020 0434   Sepsis Labs Invalid input(s): PROCALCITONIN,  WBC,  LACTICIDVEN Microbiology Recent Results (from the past 240 hour(s))  SARS CORONAVIRUS 2 (TAT 6-24 HRS) Nasopharyngeal Nasopharyngeal Swab     Status: None   Collection Time: 01/17/21  4:10 AM   Specimen: Nasopharyngeal Swab  Result Value Ref  Range Status   SARS Coronavirus 2 NEGATIVE NEGATIVE Final    Comment: (NOTE) SARS-CoV-2 target nucleic acids are NOT DETECTED.  The SARS-CoV-2 RNA is generally detectable in upper and lower respiratory specimens during the acute phase of infection. Negative results do not preclude SARS-CoV-2 infection, do not rule out co-infections with other pathogens, and should not be used as the sole basis for treatment or other patient management decisions. Negative results must be combined with clinical observations, patient history, and epidemiological information. The expected result is Negative.  Fact Sheet for Patients: SugarRoll.be  Fact Sheet for Healthcare Providers: https://www.woods-mathews.com/  This test is not yet approved or cleared by the [place] FDA and  has been authorized for detection and/or diagnosis of SARS-CoV-2 by FDA under an Emergency  Use Authorization (EUA). This EUA will remain  in effect (meaning this test can be used) for the duration of the COVID-19 declaration under Se ction 564(b)(1) of the Act, 21 U.S.C. section 360bbb-3(b)(1), unless the authorization is terminated or revoked sooner.  Performed at [facility], [place] [address] [place], [place] [address]   MRSA PCR Screening     Status: Abnormal   Collection Time: 01/17/21  4:55 AM  Result Value Ref Range Status   MRSA by PCR POSITIVE (A) NEGATIVE Final    Comment:        The GeneXpert MRSA Assay (FDA approved for NASAL specimens only), is one component of a comprehensive MRSA colonization surveillance program. It is not intended to diagnose MRSA infection nor to guide or monitor treatment for MRSA infections. RESULT CALLED TO, READ BACK BY AND VERIFIED WITHKandis Mannan RN AT [phone] 01/17/21 MULLINS,T  Performed at [facility], [place] [address], [place], [place] [address]   SARS CORONAVIRUS 2 (TAT 6-24 HRS) Nasopharyngeal  Nasopharyngeal Swab     Status: None   Collection Time: 01/20/21  6:35 PM   Specimen: Nasopharyngeal Swab  Result Value Ref Range Status   SARS Coronavirus 2 NEGATIVE NEGATIVE Final    Comment: (NOTE) SARS-CoV-2 target nucleic acids are NOT DETECTED.  The SARS-CoV-2 RNA is generally detectable in upper and lower respiratory specimens during the acute phase of infection. Negative results do not preclude SARS-CoV-2 infection, do not rule out co-infections with other pathogens, and should not be used as the sole basis for treatment or other patient management decisions. Negative results must be combined with clinical observations, patient history, and epidemiological information. The expected result is Negative.  Fact Sheet for Patients: SugarRoll.be  Fact Sheet for Healthcare Providers: https://www.woods-mathews.com/  This test is not yet approved or cleared by the [place] FDA and  has been authorized for detection and/or diagnosis of SARS-CoV-2 by FDA under an Emergency Use Authorization (EUA). This EUA will remain  in effect (meaning this test can be used) for the duration of the COVID-19 declaration under Se ction 564(b)(1) of the Act, 21 U.S.C. section 360bbb-3(b)(1), unless the authorization is terminated or revoked sooner.  Performed at [facility], [place] [address], [place], [place] [address]      Time coordinating discharge: 45 minutes  SIGNED:   [name], MD  [facility] 01/21/2021, 10:50 AM

## 2021-01-21 NOTE — TOC Progression Note (Signed)
Transition of Care [facility]) - Progression Note    Patient Details  Name: Michele Green MRN: [id number] Date of Birth: 29-Aug-1941  [facility]) CM/SW Contact  Codey Burling, Juliann Pulse, RN Phone Number: 01/21/2021, 11:34 AM  Clinical Narrative: D/C Summary sent awaiting [facility] rep Mariann Laster to confirm if able to accept back today.[facility] [place] rep Ronalee Belts aware of d/c today HHPT/OT.      Expected Discharge Plan: Assisted Living Barriers to Discharge: Other (comment)  Expected Discharge Plan and Services Expected Discharge Plan: Assisted Living   Discharge Planning Services: CM Consult Post Acute Care Choice:  (ALF) Living arrangements for the past 2 months: [place] Expected Discharge Date: 01/21/21                                     Social Determinants of Health (SDOH) Interventions    Readmission Risk Interventions Readmission Risk Prevention Plan 01/19/2021  Medication Review ([place]) Complete  PCP or Specialist appointment within 3-5 days of discharge Complete  HRI or Home Care Consult Complete  SW Recovery Care/Counseling Consult Complete  Palliative Care Screening Complete  Skilled Nursing Facility Complete  Some recent data might be hidden

## 2021-01-21 NOTE — TOC Transition Note (Signed)
Transition of Care [facility]) - CM/SW Discharge Note   Patient Details  Name: Michele Green MRN: [id number] Date of Birth: 04-May-1941  Transition of Care [facility]) CM/SW Contact:  Dessa Phi, RN Phone Number: 01/21/2021, 12:44 PM   Clinical Narrative: d/c today back to [facility] spoke to [place] to receive-going to rm#[phone],nsg call report tel#[phone]-ask for [place]. PTAR called. No further CM needs.      Final next level of care: Assisted Living Barriers to Discharge: Other (comment)   Patient Goals and CMS Choice Patient states their goals for this hospitalization and ongoing recovery are:: per son return back to ALF CMS Medicare.gov Compare Post Acute Care list provided to:: Patient Represenative (must comment) Choice offered to / list presented to : Adult Children  Discharge Placement              Patient chooses bed at: [facility] [place] Patient to be transferred to facility by: [place] Name of family member notified: [name] son left vm Patient and family notified of of transfer: 01/21/21  Discharge Plan and Services   Discharge Planning Services: CM Consult Post Acute Care Choice:  (ALF)                               Social Determinants of Health (SDOH) Interventions     Readmission Risk Interventions Readmission Risk Prevention Plan 01/19/2021  Medication Review ([place]) Complete  PCP or Specialist appointment within 3-5 days of discharge Complete  HRI or Home Care Consult Complete  SW Recovery Care/Counseling Consult Complete  Palliative Care Screening Complete  Skilled Nursing Facility Complete  Some recent data might be hidden

## 2021-01-21 NOTE — Progress Notes (Signed)
Physical Therapy Treatment Patient Details Name: Michele Green MRN: [id number] DOB: 1941-07-13 Today's Date: 01/21/2021    History of Present Illness Michele Green is a 80 y.o. female with medical history significant for atrial fibrillation on Xarelto, history of breast cancer, CAD, history of Maze procedure and mitral valve repair, chronic pain, ulcerative colitis, and 2 L/min supplemental oxygen requirement,  presented to the emergency department for evaluation of shortness of breath and lightheadedness when sitting up.  Patient was hospitalized with COVID-19 7 weeks ago, was discharged to an SNF on 12/11/2020 but is now back at ALF and reports that she has been bedbound ever since the hospital discharge and has now been able to even sit up in bed without assistance.  She has been requiring 2 L/min of supplemental oxygen since the hospital discharge    PT Comments    Pt assisted to sitting EOB.  Pt declined further mobility.  Pt assisted with performing exercises sitting and upon return to supine.  Pt no longer on oxygen and SPO2 96% on room air end of session.  Pt to d/c likely today.   Follow Up Recommendations  SNF     Equipment Recommendations  None recommended by PT    Recommendations for Other Services       Precautions / Restrictions Precautions Precautions: Fall Restrictions Other Position/Activity Restrictions: WBAT    Mobility  Bed Mobility Overal bed mobility: Needs Assistance Bed Mobility: Rolling;Sidelying to Sit;Sit to Sidelying Rolling: Min guard   Supine to sit: Mod assist Sit to supine: Mod assist   General bed mobility comments: multimodal cues for self assist, assist for trunk upright and scooting to EOB; assist for LEs for back to bed    Transfers                 General transfer comment: pt declined standing or transfers  Ambulation/Gait                 Stairs             Wheelchair Mobility    Modified Rankin (Stroke  Patients Only)       Balance                                            Cognition Arousal/Alertness: Awake/alert Behavior During Therapy: WFL for tasks assessed/performed Overall Cognitive Status: No family/caregiver present to determine baseline cognitive functioning                                 General Comments: pleasantly confused, follows commands consistently      Exercises General Exercises - Lower Extremity Ankle Circles/Pumps: AROM;Both;10 reps Quad Sets: AROM;Both;10 reps Long Arc Quad: AROM;Seated;Both;10 reps Heel Slides: AAROM;Supine;10 reps;Both (guided LEs to protect skin and heels during exercises) Hip ABduction/ADduction: AAROM;Both;10 reps;Supine Straight Leg Raises: AAROM;Supine;Both;10 reps    General Comments        Pertinent Vitals/Pain Pain Assessment: Faces Faces Pain Scale: Hurts a little bit Pain Location: tender heels Pain Descriptors / Indicators: Sore Pain Intervention(s): Repositioned;Monitored during session (heels floated end of session)    Home Living                      Prior Function  PT Goals (current goals can now be found in the care plan section) Progress towards PT goals: Progressing toward goals    Frequency    Min 2X/week      PT Plan Current plan remains appropriate;Frequency needs to be updated    Co-evaluation              AM-PAC PT "6 Clicks" Mobility   Outcome Measure  Help needed turning from your back to your side while in a flat bed without using bedrails?: A Little Help needed moving from lying on your back to sitting on the side of a flat bed without using bedrails?: A Lot Help needed moving to and from a bed to a chair (including a wheelchair)?: A Lot Help needed standing up from a chair using your arms (Green.g., wheelchair or bedside chair)?: A Lot Help needed to walk in hospital room?: A Lot Help needed climbing 3-5 steps with a railing? : A  Lot 6 Click Score: 13    End of Session Equipment Utilized During Treatment: Gait belt Activity Tolerance: Patient limited by fatigue Patient left: in bed;with call bell/phone within reach;with bed alarm set   PT Visit Diagnosis: Muscle weakness (generalized) (M62.81)     Time: 6945-0388 PT Time Calculation (min) (ACUTE ONLY): 23 min  Charges:  $Therapeutic Activity: 8-22 mins                     Jannette Spanner PT, DPT Acute Rehabilitation Services Pager: [phone] Office: [phone]  [name],Michele Green 01/21/2021, 1:25 PM

## 2021-01-24 ENCOUNTER — Inpatient Hospital Stay (HOSPITAL_COMMUNITY)
Admission: EM | Admit: 2021-01-24 | Discharge: 2021-02-03 | DRG: 308 | Disposition: A | Discharge status: Home / Self Care | Payer: Medicare Other | Source: Skilled Nursing Facility | Attending: Internal Medicine | Admitting: Internal Medicine

## 2021-01-24 ENCOUNTER — Emergency Department (HOSPITAL_COMMUNITY): Payer: Medicare Other

## 2021-01-24 ENCOUNTER — Other Ambulatory Visit: Payer: Self-pay

## 2021-01-24 DIAGNOSIS — L89626 Pressure-induced deep tissue damage of left heel: Secondary | ICD-10-CM | POA: Diagnosis present

## 2021-01-24 DIAGNOSIS — D696 Thrombocytopenia, unspecified: Secondary | ICD-10-CM | POA: Diagnosis not present

## 2021-01-24 DIAGNOSIS — R000 Tachycardia, unspecified: Secondary | ICD-10-CM | POA: Diagnosis not present

## 2021-01-24 DIAGNOSIS — Z8701 Personal history of pneumonia (recurrent): Secondary | ICD-10-CM

## 2021-01-24 DIAGNOSIS — F419 Anxiety disorder, unspecified: Secondary | ICD-10-CM | POA: Diagnosis present

## 2021-01-24 DIAGNOSIS — T380X5D Adverse effect of glucocorticoids and synthetic analogues, subsequent encounter: Secondary | ICD-10-CM | POA: Diagnosis not present

## 2021-01-24 DIAGNOSIS — Z515 Encounter for palliative care: Secondary | ICD-10-CM | POA: Diagnosis not present

## 2021-01-24 DIAGNOSIS — Z86010 Personal history of colonic polyps: Secondary | ICD-10-CM | POA: Diagnosis not present

## 2021-01-24 DIAGNOSIS — Z952 Presence of prosthetic heart valve: Secondary | ICD-10-CM

## 2021-01-24 DIAGNOSIS — E785 Hyperlipidemia, unspecified: Secondary | ICD-10-CM | POA: Diagnosis not present

## 2021-01-24 DIAGNOSIS — D638 Anemia in other chronic diseases classified elsewhere: Secondary | ICD-10-CM | POA: Diagnosis present

## 2021-01-24 DIAGNOSIS — Z66 Do not resuscitate: Secondary | ICD-10-CM | POA: Diagnosis present

## 2021-01-24 DIAGNOSIS — S91312D Laceration without foreign body, left foot, subsequent encounter: Secondary | ICD-10-CM | POA: Diagnosis not present

## 2021-01-24 DIAGNOSIS — Z8616 Personal history of COVID-19: Secondary | ICD-10-CM | POA: Diagnosis not present

## 2021-01-24 DIAGNOSIS — I499 Cardiac arrhythmia, unspecified: Secondary | ICD-10-CM | POA: Diagnosis not present

## 2021-01-24 DIAGNOSIS — I4891 Unspecified atrial fibrillation: Secondary | ICD-10-CM | POA: Diagnosis not present

## 2021-01-24 DIAGNOSIS — C50412 Malignant neoplasm of upper-outer quadrant of left female breast: Secondary | ICD-10-CM | POA: Diagnosis present

## 2021-01-24 DIAGNOSIS — I110 Hypertensive heart disease with heart failure: Secondary | ICD-10-CM | POA: Diagnosis present

## 2021-01-24 DIAGNOSIS — E876 Hypokalemia: Secondary | ICD-10-CM | POA: Diagnosis not present

## 2021-01-24 DIAGNOSIS — I4819 Other persistent atrial fibrillation: Secondary | ICD-10-CM | POA: Diagnosis not present

## 2021-01-24 DIAGNOSIS — I890 Lymphedema, not elsewhere classified: Secondary | ICD-10-CM | POA: Diagnosis not present

## 2021-01-24 DIAGNOSIS — M25521 Pain in right elbow: Secondary | ICD-10-CM | POA: Diagnosis not present

## 2021-01-24 DIAGNOSIS — I441 Atrioventricular block, second degree: Secondary | ICD-10-CM | POA: Diagnosis not present

## 2021-01-24 DIAGNOSIS — L89616 Pressure-induced deep tissue damage of right heel: Secondary | ICD-10-CM | POA: Diagnosis not present

## 2021-01-24 DIAGNOSIS — Z9181 History of falling: Secondary | ICD-10-CM | POA: Diagnosis not present

## 2021-01-24 DIAGNOSIS — R0689 Other abnormalities of breathing: Secondary | ICD-10-CM | POA: Diagnosis not present

## 2021-01-24 DIAGNOSIS — L89321 Pressure ulcer of left buttock, stage 1: Secondary | ICD-10-CM | POA: Diagnosis present

## 2021-01-24 DIAGNOSIS — R6510 Systemic inflammatory response syndrome (SIRS) of non-infectious origin without acute organ dysfunction: Secondary | ICD-10-CM

## 2021-01-24 DIAGNOSIS — I272 Pulmonary hypertension, unspecified: Secondary | ICD-10-CM | POA: Diagnosis not present

## 2021-01-24 DIAGNOSIS — I4892 Unspecified atrial flutter: Secondary | ICD-10-CM | POA: Diagnosis not present

## 2021-01-24 DIAGNOSIS — Z7401 Bed confinement status: Secondary | ICD-10-CM

## 2021-01-24 DIAGNOSIS — I471 Supraventricular tachycardia: Secondary | ICD-10-CM | POA: Diagnosis present

## 2021-01-24 DIAGNOSIS — Z923 Personal history of irradiation: Secondary | ICD-10-CM

## 2021-01-24 DIAGNOSIS — I2510 Atherosclerotic heart disease of native coronary artery without angina pectoris: Secondary | ICD-10-CM | POA: Diagnosis not present

## 2021-01-24 DIAGNOSIS — D649 Anemia, unspecified: Secondary | ICD-10-CM | POA: Diagnosis not present

## 2021-01-24 DIAGNOSIS — I5033 Acute on chronic diastolic (congestive) heart failure: Secondary | ICD-10-CM | POA: Diagnosis not present

## 2021-01-24 DIAGNOSIS — E8809 Other disorders of plasma-protein metabolism, not elsewhere classified: Secondary | ICD-10-CM | POA: Diagnosis not present

## 2021-01-24 DIAGNOSIS — J9611 Chronic respiratory failure with hypoxia: Secondary | ICD-10-CM | POA: Diagnosis present

## 2021-01-24 DIAGNOSIS — M109 Gout, unspecified: Secondary | ICD-10-CM | POA: Diagnosis present

## 2021-01-24 DIAGNOSIS — I951 Orthostatic hypotension: Secondary | ICD-10-CM | POA: Diagnosis not present

## 2021-01-24 DIAGNOSIS — Z951 Presence of aortocoronary bypass graft: Secondary | ICD-10-CM

## 2021-01-24 DIAGNOSIS — N390 Urinary tract infection, site not specified: Secondary | ICD-10-CM | POA: Diagnosis not present

## 2021-01-24 DIAGNOSIS — R600 Localized edema: Secondary | ICD-10-CM | POA: Diagnosis not present

## 2021-01-24 DIAGNOSIS — S41112D Laceration without foreign body of left upper arm, subsequent encounter: Secondary | ICD-10-CM | POA: Diagnosis not present

## 2021-01-24 DIAGNOSIS — Z79810 Long term (current) use of selective estrogen receptor modulators (SERMs): Secondary | ICD-10-CM

## 2021-01-24 DIAGNOSIS — R532 Functional quadriplegia: Secondary | ICD-10-CM | POA: Diagnosis present

## 2021-01-24 DIAGNOSIS — M25421 Effusion, right elbow: Secondary | ICD-10-CM | POA: Diagnosis not present

## 2021-01-24 DIAGNOSIS — M810 Age-related osteoporosis without current pathological fracture: Secondary | ICD-10-CM | POA: Diagnosis not present

## 2021-01-24 DIAGNOSIS — Z7952 Long term (current) use of systemic steroids: Secondary | ICD-10-CM | POA: Diagnosis not present

## 2021-01-24 DIAGNOSIS — Z7901 Long term (current) use of anticoagulants: Secondary | ICD-10-CM | POA: Diagnosis not present

## 2021-01-24 DIAGNOSIS — R778 Other specified abnormalities of plasma proteins: Secondary | ICD-10-CM | POA: Diagnosis present

## 2021-01-24 DIAGNOSIS — E271 Primary adrenocortical insufficiency: Secondary | ICD-10-CM | POA: Diagnosis present

## 2021-01-24 DIAGNOSIS — Z1501 Genetic susceptibility to malignant neoplasm of breast: Secondary | ICD-10-CM

## 2021-01-24 DIAGNOSIS — I5032 Chronic diastolic (congestive) heart failure: Secondary | ICD-10-CM | POA: Diagnosis not present

## 2021-01-24 DIAGNOSIS — I4821 Permanent atrial fibrillation: Principal | ICD-10-CM | POA: Diagnosis present

## 2021-01-24 DIAGNOSIS — M6259 Muscle wasting and atrophy, not elsewhere classified, multiple sites: Secondary | ICD-10-CM | POA: Diagnosis present

## 2021-01-24 DIAGNOSIS — G894 Chronic pain syndrome: Secondary | ICD-10-CM | POA: Diagnosis not present

## 2021-01-24 DIAGNOSIS — J90 Pleural effusion, not elsewhere classified: Secondary | ICD-10-CM | POA: Diagnosis not present

## 2021-01-24 DIAGNOSIS — Z87440 Personal history of urinary (tract) infections: Secondary | ICD-10-CM | POA: Diagnosis not present

## 2021-01-24 DIAGNOSIS — Z853 Personal history of malignant neoplasm of breast: Secondary | ICD-10-CM

## 2021-01-24 DIAGNOSIS — Z8249 Family history of ischemic heart disease and other diseases of the circulatory system: Secondary | ICD-10-CM

## 2021-01-24 DIAGNOSIS — I959 Hypotension, unspecified: Secondary | ICD-10-CM | POA: Diagnosis not present

## 2021-01-24 DIAGNOSIS — R0902 Hypoxemia: Secondary | ICD-10-CM | POA: Diagnosis not present

## 2021-01-24 DIAGNOSIS — I484 Atypical atrial flutter: Secondary | ICD-10-CM | POA: Diagnosis not present

## 2021-01-24 DIAGNOSIS — Z803 Family history of malignant neoplasm of breast: Secondary | ICD-10-CM

## 2021-01-24 DIAGNOSIS — Z79899 Other long term (current) drug therapy: Secondary | ICD-10-CM

## 2021-01-24 DIAGNOSIS — I480 Paroxysmal atrial fibrillation: Secondary | ICD-10-CM | POA: Diagnosis not present

## 2021-01-24 DIAGNOSIS — I9589 Other hypotension: Secondary | ICD-10-CM | POA: Diagnosis not present

## 2021-01-24 DIAGNOSIS — J9690 Respiratory failure, unspecified, unspecified whether with hypoxia or hypercapnia: Secondary | ICD-10-CM | POA: Diagnosis not present

## 2021-01-24 DIAGNOSIS — L89891 Pressure ulcer of other site, stage 1: Secondary | ICD-10-CM | POA: Diagnosis present

## 2021-01-24 DIAGNOSIS — Z7189 Other specified counseling: Secondary | ICD-10-CM | POA: Diagnosis not present

## 2021-01-24 DIAGNOSIS — K5190 Ulcerative colitis, unspecified, without complications: Secondary | ICD-10-CM | POA: Diagnosis not present

## 2021-01-24 DIAGNOSIS — Z87891 Personal history of nicotine dependence: Secondary | ICD-10-CM

## 2021-01-24 DIAGNOSIS — E273 Drug-induced adrenocortical insufficiency: Secondary | ICD-10-CM | POA: Diagnosis not present

## 2021-01-24 DIAGNOSIS — L89311 Pressure ulcer of right buttock, stage 1: Secondary | ICD-10-CM | POA: Diagnosis present

## 2021-01-24 LAB — PHOSPHORUS: Phosphorus: 3.2 mg/dL (ref 2.5–4.6)

## 2021-01-24 LAB — CBC WITH DIFFERENTIAL/PLATELET
Abs Immature Granulocytes: 0.11 10*3/uL — ABNORMAL HIGH (ref 0.00–0.07)
Basophils Absolute: 0 10*3/uL (ref 0.0–0.1)
Basophils Relative: 0 %
Eosinophils Absolute: 0 10*3/uL (ref 0.0–0.5)
Eosinophils Relative: 0 %
HCT: 25.8 % — ABNORMAL LOW (ref 36.0–46.0)
Hemoglobin: 8 g/dL — ABNORMAL LOW (ref 12.0–15.0)
Immature Granulocytes: 1 %
Lymphocytes Relative: 3 %
Lymphs Abs: 0.4 10*3/uL — ABNORMAL LOW (ref 0.7–4.0)
MCH: 30.8 pg (ref 26.0–34.0)
MCHC: 31 g/dL (ref 30.0–36.0)
MCV: 99.2 fL (ref 80.0–100.0)
Monocytes Absolute: 0.3 10*3/uL (ref 0.1–1.0)
Monocytes Relative: 2 %
Neutro Abs: 13 10*3/uL — ABNORMAL HIGH (ref 1.7–7.7)
Neutrophils Relative %: 94 %
Platelets: 123 10*3/uL — ABNORMAL LOW (ref 150–400)
RBC: 2.6 MIL/uL — ABNORMAL LOW (ref 3.87–5.11)
RDW: 15.9 % — ABNORMAL HIGH (ref 11.5–15.5)
WBC: 13.8 10*3/uL — ABNORMAL HIGH (ref 4.0–10.5)
nRBC: 0.1 % (ref 0.0–0.2)

## 2021-01-24 LAB — COMPREHENSIVE METABOLIC PANEL
ALT: 39 U/L (ref 0–44)
AST: 80 U/L — ABNORMAL HIGH (ref 15–41)
Albumin: 2.2 g/dL — ABNORMAL LOW (ref 3.5–5.0)
Alkaline Phosphatase: 62 U/L (ref 38–126)
Anion gap: 9 (ref 5–15)
BUN: 16 mg/dL (ref 8–23)
CO2: 24 mmol/L (ref 22–32)
Calcium: 8.1 mg/dL — ABNORMAL LOW (ref 8.9–10.3)
Chloride: 106 mmol/L (ref 98–111)
Creatinine, Ser: 0.78 mg/dL (ref 0.44–1.00)
GFR, Estimated: >60 mL/min (ref >60)
Glucose, Bld: 117 mg/dL — ABNORMAL HIGH (ref 70–99)
Potassium: 4.4 mmol/L (ref 3.5–5.1)
Sodium: 139 mmol/L (ref 135–145)
Total Bilirubin: 1.4 mg/dL — ABNORMAL HIGH (ref 0.3–1.2)
Total Protein: 5.2 g/dL — ABNORMAL LOW (ref 6.5–8.1)

## 2021-01-24 LAB — TYPE AND SCREEN
ABO/RH(D): O POS
Antibody Screen: NEGATIVE

## 2021-01-24 LAB — RETICULOCYTES
Immature Retic Fract: 29.7 % — ABNORMAL HIGH (ref 2.3–15.9)
RBC.: 2.47 MIL/uL — ABNORMAL LOW (ref 3.87–5.11)
Retic Count, Absolute: 72.9 10*3/uL (ref 19.0–186.0)
Retic Ct Pct: 3 % (ref 0.4–3.1)

## 2021-01-24 LAB — URINALYSIS, ROUTINE W REFLEX MICROSCOPIC
Bilirubin Urine: NEGATIVE
Glucose, UA: NEGATIVE mg/dL
Hgb urine dipstick: NEGATIVE
Ketones, ur: NEGATIVE mg/dL
Nitrite: NEGATIVE
Protein, ur: NEGATIVE mg/dL
Specific Gravity, Urine: 1.015 (ref 1.005–1.030)
pH: 5 (ref 5.0–8.0)

## 2021-01-24 LAB — MAGNESIUM: Magnesium: 1.7 mg/dL (ref 1.7–2.4)

## 2021-01-24 LAB — TROPONIN I (HIGH SENSITIVITY)
Troponin I (High Sensitivity): 25 ng/L — ABNORMAL HIGH (ref <18)
Troponin I (High Sensitivity): 24 ng/L — ABNORMAL HIGH (ref <18)

## 2021-01-24 LAB — CK: Total CK: 10 U/L — ABNORMAL LOW (ref 38–234)

## 2021-01-24 LAB — PROTIME-INR
INR: 1.5 — ABNORMAL HIGH (ref 0.8–1.2)
Prothrombin Time: 17.4 seconds — ABNORMAL HIGH (ref 11.4–15.2)

## 2021-01-24 LAB — RESP PANEL BY RT-PCR (FLU A&B, COVID) ARPGX2
Influenza A by PCR: NEGATIVE
Influenza B by PCR: NEGATIVE
SARS Coronavirus 2 by RT PCR: NEGATIVE

## 2021-01-24 LAB — BRAIN NATRIURETIC PEPTIDE: B Natriuretic Peptide: 478.7 pg/mL — ABNORMAL HIGH (ref 0.0–100.0)

## 2021-01-24 LAB — LACTIC ACID, PLASMA
Lactic Acid, Venous: 2.1 mmol/L (ref 0.5–1.9)
Lactic Acid, Venous: 1.5 mmol/L (ref 0.5–1.9)

## 2021-01-24 LAB — PROCALCITONIN: Procalcitonin: 14.95 ng/mL

## 2021-01-24 MED ORDER — SODIUM CHLORIDE 0.9 % IV SOLN
75.0000 mL/h | INTRAVENOUS | Status: DC
  Administered 2021-01-24: 75 mL/h via INTRAVENOUS

## 2021-01-24 MED ORDER — RIVAROXABAN 15 MG PO TABS
15.0000 mg | ORAL_TABLET | Freq: Every day | ORAL | Status: DC
  Administered 2021-01-24 – 2021-02-02 (×10): 15 mg via ORAL
  Filled 2021-01-24 (×11): qty 1

## 2021-01-24 MED ORDER — SODIUM CHLORIDE 0.9% FLUSH
10.0000 mL | Freq: Two times a day (BID) | INTRAVENOUS | Status: DC
  Administered 2021-01-25 – 2021-01-31 (×9): 10 mL

## 2021-01-24 MED ORDER — ALLOPURINOL 100 MG PO TABS
200.0000 mg | ORAL_TABLET | Freq: Every evening | ORAL | Status: DC
  Administered 2021-01-24 – 2021-02-02 (×10): 200 mg via ORAL
  Filled 2021-01-24 (×10): qty 2

## 2021-01-24 MED ORDER — MESALAMINE ER 0.375 G PO CP24: 0.3750 g | ORAL_CAPSULE | Freq: Every day | ORAL | Status: DC

## 2021-01-24 MED ORDER — MIDODRINE HCL 5 MG PO TABS
5.0000 mg | ORAL_TABLET | Freq: Three times a day (TID) | ORAL | Status: DC
  Administered 2021-01-25 – 2021-01-26 (×4): 5 mg via ORAL
  Filled 2021-01-24 (×5): qty 1

## 2021-01-24 MED ORDER — LACTATED RINGERS IV BOLUS
500.0000 mL | Freq: Once | INTRAVENOUS | Status: AC
  Administered 2021-01-24: 500 mL via INTRAVENOUS

## 2021-01-24 MED ORDER — ACETAMINOPHEN 650 MG RE SUPP: 650.0000 mg | Freq: Four times a day (QID) | RECTAL | Status: DC | PRN

## 2021-01-24 MED ORDER — TAMOXIFEN CITRATE 10 MG PO TABS
20.0000 mg | ORAL_TABLET | Freq: Every day | ORAL | Status: DC
  Administered 2021-01-25 – 2021-02-02 (×9): 20 mg via ORAL
  Filled 2021-01-24 (×9): qty 2

## 2021-01-24 MED ORDER — HYDROCODONE-ACETAMINOPHEN 5-325 MG PO TABS: 1.0000 | ORAL_TABLET | ORAL | Status: DC | PRN

## 2021-01-24 MED ORDER — ATORVASTATIN CALCIUM 10 MG PO TABS
20.0000 mg | ORAL_TABLET | Freq: Every day | ORAL | Status: DC
  Administered 2021-01-25 – 2021-01-31 (×7): 20 mg via ORAL
  Filled 2021-01-24: qty 1
  Filled 2021-01-24: qty 2
  Filled 2021-01-24 (×2): qty 1
  Filled 2021-01-24 (×3): qty 2

## 2021-01-24 MED ORDER — HYDROCORTISONE NA SUCCINATE PF 100 MG IJ SOLR
100.0000 mg | Freq: Three times a day (TID) | INTRAMUSCULAR | Status: DC
  Administered 2021-01-24 – 2021-01-26 (×5): 100 mg via INTRAVENOUS
  Filled 2021-01-24 (×5): qty 2

## 2021-01-24 MED ORDER — DILTIAZEM HCL-DEXTROSE 125-5 MG/125ML-% IV SOLN (PREMIX)
5.0000 mg/h | INTRAVENOUS | Status: DC
  Administered 2021-01-24: 5 mg/h via INTRAVENOUS
  Filled 2021-01-24: qty 125

## 2021-01-24 MED ORDER — SODIUM CHLORIDE 0.9% FLUSH
10.0000 mL | INTRAVENOUS | Status: DC | PRN
  Administered 2021-01-29: 10 mL

## 2021-01-24 MED ORDER — ACETAMINOPHEN 325 MG PO TABS: 650.0000 mg | ORAL_TABLET | Freq: Four times a day (QID) | ORAL | Status: DC | PRN

## 2021-01-24 MED ORDER — ALBUMIN HUMAN 5 % IV SOLN
12.5000 g | Freq: Once | INTRAVENOUS | Status: AC
  Administered 2021-01-24: 12.5 g via INTRAVENOUS
  Filled 2021-01-24: qty 250

## 2021-01-24 MED ORDER — SODIUM CHLORIDE 0.9 % IV SOLN
1.0000 g | Freq: Once | INTRAVENOUS | Status: AC
  Administered 2021-01-24: 1 g via INTRAVENOUS
  Filled 2021-01-24: qty 10

## 2021-01-24 MED ORDER — THIAMINE HCL 100 MG PO TABS
100.0000 mg | ORAL_TABLET | Freq: Every day | ORAL | Status: DC
  Administered 2021-01-25 – 2021-01-31 (×7): 100 mg via ORAL
  Filled 2021-01-24 (×7): qty 1

## 2021-01-24 MED ORDER — SODIUM CHLORIDE 0.9 % IV SOLN
1.0000 g | INTRAVENOUS | Status: DC
  Administered 2021-01-25 – 2021-01-29 (×5): 1 g via INTRAVENOUS
  Filled 2021-01-24 (×2): qty 10
  Filled 2021-01-24: qty 0.05
  Filled 2021-01-24 (×2): qty 10

## 2021-01-24 NOTE — ED Notes (Signed)
Critical lactic 2.1 reported from lab, provider made aware.

## 2021-01-24 NOTE — H&P (Signed)
Michele Green TML:[id number] DOB: Apr 10, 1941 DOA: 01/24/2021     PCP: [name], MD   Outpatient Specialists:   CARDS: [name]    Patient arrived to ER on 01/24/21 at 1409 Referred by Attending Blanchie Dessert, MD   Patient coming from  From facility [facility]  Chief Complaint: Dysuria yellow drainage from extremities HPI: Michele Green is a 79 y.o. 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  Leg edema. Patient has been recently treated for UTI last week but continued to have some dysuria. Also states she has a wound of her left foot that is been going on for past few months appears to be a pressure ulcer she feels like she has multiple skin tears as she has generalized thinning of the skin secondary to be used chronic steroids. Denies any abdominal point pain no nausea no vomiting.  No flank pain.  No fevers or chills. Reports that her stools have been regular no constipation or diarrhea.  No fevers or chills.  Recently admitted for adrenal insufficiency with hypotension her random cortisol at the time of prior admission was 11.7 she received stress dose steroids and midodrine which has improved her hypotension. Echogram was done that showed preserved EF 60-65% and right ventricular function also preserved. There was some evidence of pulmonary hypertension Prior to discharge they were able to wean off of midodrine Discharged on slow taper prednisone Her metoprolol was increased to 75 mg twice daily and her digoxin was stopped.  Patient has underlying paroxysmal atrial fibrillation with second-degree AV block and sinus tachycardia as well.  She has been taking for this metoprolol diltiazem and digoxin  When she was  able to resume her diltiazem and metoprolol were held digoxin On chronic anticoagulation rivaroxaban  She is onInfectious risk factors:  Reports shortness of breath     Has been vaccinated against COVID and boosted   Initial COVID TEST  NEGATIVE   Lab Results  Component Value Date   [place] NEGATIVE 01/24/2021   [place] NEGATIVE 01/20/2021   [place] NEGATIVE 01/17/2021   [place] NEGATIVE 09/19/2020     Regarding pertinent Chronic problems:      Hyperlipidemia - on statins Lipitor Lipid Panel     Component Value Date/Time   TRIG 131 12/01/2020 1424   History of breast cancer on tamoxifen   Ulcerative colitis -on mesalamine well-controlled    chronic CHF diastolic  - last echo March 2022 preserved EF and RV function Status post mitral valve repair in 2019     CAD  - On Aspirin, statin, betablocker, Plavix                 - followed by cardiology                - last cardiac cath 2019 Status post CABG status post Maze procedure status post MVR mitral valve repair      A. Fib -  - CHA2DS2 vas score    6    current  on anticoagulation with  Xarelto,            -  Rate control:  Currently controlled with  *Metoprolol,* Diltiazem,         - Rhythm control: Failed attempt for DC cardioversion survey version and could  not tolerate amiodarone Usually has fairly low blood pressures but able to tolerate metoprolol/Cardizem for A. fib rate control     anemia of chronic disease- baseline hg Hemoglobin & Hematocrit  Recent Labs    01/17/21 0524 01/18/21 0325 01/24/21 1610  HGB 8.8* 8.5* 8.0*    While in ER: Noted to be tachycardic up to 140s and hypotensive Noted be with A. fib with RVR. Given IV fluid bolus 500 mL with improvement of her vital signs noted a around her baseline her blood pressure stays around 110 115. She was given a dose of Rocephin prophylactically since she has had ongoing dysuria and recent UTI UA appears to have only rare bacteria  at this time   Hospitalist was called for admission for hypotension A. fib with RVR  The following Work up has been ordered so far:  Orders Placed This Encounter  Procedures  . Critical Care  . Blood culture (routine x 2)  . Resp Panel by RT-PCR (Flu A&B, Covid) Nasopharyngeal Swab  . DG Chest Port 1 View  . DG Elbow 2 Views Right  . Comprehensive metabolic panel  . Lactic acid, plasma  . CBC with Differential  . Urinalysis, Routine w reflex microscopic  . Cortisol, Random  . CBC with Differential/Platelet  . Brain natriuretic peptide  . Consult to hospitalist  . Airborne and Contact precautions  . ED EKG    Following Medications were ordered in ER: Medications  diltiazem (CARDIZEM) 125 mg in dextrose 5% 125 mL (1 mg/mL) infusion (5 mg/hr Intravenous New Bag/Given 01/24/21 1838)  lactated ringers bolus 500 mL (0 mLs Intravenous Stopped 01/24/21 1603)  lactated ringers bolus 500 mL (0 mLs Intravenous Stopped 01/24/21 1735)  cefTRIAXone (ROCEPHIN) 1 g in sodium chloride 0.9 % 100 mL IVPB (0 g Intravenous Stopped 01/24/21 1808)        Consult Orders  (From admission, onward)         Start     Ordered   01/24/21 1735  Consult to hospitalist  Once       Provider:  (Not yet assigned)  Question Answer Comment  Place call to: [facility]   Reason for Consult Admit      01/24/21 1734          Significant initial  Findings: Abnormal Labs Reviewed  COMPREHENSIVE METABOLIC PANEL - Abnormal; Notable for the following components:      Result Value   Glucose, Bld 117 (*)    Calcium 8.1 (*)    Total Protein 5.2 (*)    Albumin 2.2 (*)    AST 80 (*)    Total Bilirubin 1.4 (*)    All other components within normal limits  LACTIC ACID, PLASMA - Abnormal; Notable for the following components:   Lactic Acid, Venous 2.1 (*)    All other components within normal limits  URINALYSIS, ROUTINE W REFLEX MICROSCOPIC - Abnormal; Notable for the following components:   APPearance HAZY  (*)    Leukocytes,Ua TRACE (*)    Bacteria, UA RARE (*)    All other components within normal limits  CBC WITH DIFFERENTIAL/PLATELET - Abnormal; Notable for the following components:   WBC 13.8 (*)    RBC 2.60 (*)    Hemoglobin 8.0 (*)    HCT 25.8 (*)    RDW 15.9 (*)    Platelets 123 (*)    Neutro Abs 13.0 (*)    Lymphs Abs 0.4 (*)    Abs Immature Granulocytes 0.11 (*)  All other components within normal limits  BRAIN NATRIURETIC PEPTIDE - Abnormal; Notable for the following components:   B Natriuretic Peptide 478.7 (*)    All other components within normal limits  TROPONIN I (HIGH SENSITIVITY) - Abnormal; Notable for the following components:   Troponin I (High Sensitivity) 25 (*)    All other components within normal limits  TROPONIN I (HIGH SENSITIVITY) - Abnormal; Notable for the following components:   Troponin I (High Sensitivity) 24 (*)    All other components within normal limits    Otherwise labs showing:    Recent Labs  Lab 01/18/21 0325 01/19/21 0447 01/21/21 0442 01/24/21 1522 01/24/21 1857  NA 141 139 138 139  --   K 4.2 3.6 4.4 4.4  --   CO2 19* 23 25 24   --   GLUCOSE 145* 116* 113* 117*  --   BUN 9 14 17 16   --   CREATININE 0.62 0.76 0.58 0.78  --   CALCIUM 7.2* 7.8* 8.2* 8.1*  --   MG 2.2  --   --   --   --   PHOS  --   --   --   --  3.2    Cr  Stable  Lab Results  Component Value Date   CREATININE 0.78 01/24/2021   CREATININE 0.58 01/21/2021   CREATININE 0.76 01/19/2021    Recent Labs  Lab 01/24/21 1522  AST 80*  ALT 39  ALKPHOS 62  BILITOT 1.4*  PROT 5.2*  ALBUMIN 2.2*   Lab Results  Component Value Date   CALCIUM 8.1 (L) 01/24/2021   PHOS 4.1 12/03/2020        WBC       Component Value Date/Time   WBC 13.8 (H) 01/24/2021 1610   LYMPHSABS 0.4 (L) 01/24/2021 1610   LYMPHSABS 0.6 (L) 09/30/2017 0945   MONOABS 0.3 01/24/2021 1610   MONOABS 0.4 09/30/2017 0945   EOSABS 0.0 01/24/2021 1610   EOSABS 0.1 09/30/2017 0945    BASOSABS 0.0 01/24/2021 1610   BASOSABS 0.0 09/30/2017 0945     Plt: Lab Results  Component Value Date   PLT 123 (L) 01/24/2021     Lactic Acid, Venous    Component Value Date/Time   LATICACIDVEN 1.5 01/24/2021 1707    Procalcitonin 15   HG/HC  stable,      Component Value Date/Time   HGB 8.0 (L) 01/24/2021 1610   HGB 13.0 10/30/2019 1314   HGB 12.7 09/30/2017 0945   HCT 25.8 (L) 01/24/2021 1610   HCT 38.7 10/30/2019 1314   HCT 37.2 09/30/2017 0945   MCV 99.2 01/24/2021 1610   MCV 105 (H) 10/30/2019 1314   MCV 107.0 (H) 09/30/2017 0945    Troponin 24-25   ECG: Ordered Personally reviewed by me showing: HR : 146 Rhythm:   A.fib/flutter. W RVR,  ed   no evidence of ischemic changes QTC 413   BNP (last 3 results) Recent Labs    08/16/20 1452 01/16/21 1909 01/24/21 1610  BNP 482.0* 229.0* 478.7*        UA  no evidence of UTI    Urine analysis:    Component Value Date/Time   COLORURINE YELLOW 01/24/2021 1507   APPEARANCEUR HAZY (A) 01/24/2021 1507   LABSPEC 1.015 01/24/2021 1507   PHURINE 5.0 01/24/2021 1507   GLUCOSEU NEGATIVE 01/24/2021 1507   HGBUR NEGATIVE 01/24/2021 1507   BILIRUBINUR NEGATIVE 01/24/2021 1507   KETONESUR NEGATIVE 01/24/2021 1507   PROTEINUR  NEGATIVE 01/24/2021 1507   NITRITE NEGATIVE 01/24/2021 1507   LEUKOCYTESUR TRACE (A) 01/24/2021 1507   Ordered    CXR -  NON acute    ED Triage Vitals  Enc Vitals Group     BP 01/24/21 1440 (!) 84/65     Pulse Rate 01/24/21 1440 73     Resp 01/24/21 1440 18     Temp 01/24/21 1449 97.6 F (36.4 C)     Temp Source 01/24/21 1452 Oral     SpO2 01/24/21 1440 (!) 75 %     Weight --      Height --      Head Circumference --      Peak Flow --      Pain Score --      Pain Loc --      Pain Edu? --      Excl. in [place]? --   TMAX(24)@       Latest  Blood pressure 106/69, pulse (!) 116, temperature 99.1 F (37.3 C), temperature source Rectal, resp. rate (!) 21, SpO2 100 %.    Review of  Systems:    Pertinent positives include:  shortness of breath at rest.  dysuria lesions.  Constitutional:  No weight loss, night sweats, Fevers, chills, fatigue, weight loss  HEENT:  No headaches, Difficulty swallowing,Tooth/dental problems,Sore throat,  No sneezing, itching, ear ache, nasal congestion, post nasal drip,  Cardio-vascular:  No chest pain, Orthopnea, PND, anasarca, dizziness, palpitations.no Bilateral lower extremity swelling  GI:  No heartburn, indigestion, abdominal pain, nausea, vomiting, diarrhea, change in bowel habits, loss of appetite, melena, blood in stool, hematemesis Resp:  no No dyspnea on exertion, No excess mucus, no productive cough, No non-productive cough, No coughing up of blood.No change in color of mucus.No wheezing. Skin:  no rash or No jaundice GU:  no, change in color of urine, no urgency or frequency. No straining to urinate.  No flank pain.  Musculoskeletal:  No joint pain or no joint swelling. No decreased range of motion. No back pain.  Psych:  No change in mood or affect. No depression or anxiety. No memory loss.  Neuro: no localizing neurological complaints, no tingling, no weakness, no double vision, no gait abnormality, no slurred speech, no confusion  All systems reviewed and apart from [place] all are negative  Past Medical History:   Past Medical History:  Diagnosis Date  . Arrhythmia   . Atypical atrial flutter ([place]) 08/14/2018   Post-operative  . Breast cancer of upper-outer quadrant of left female breast ([place]) 02/02/2016  . CAD (coronary artery disease) 06/20/2018   LHC 7/19: pLAD 65/90, oD1 90, mLCx 85, OM2 50, oRCA 30, EF 50-55 >> s/p CABG in 9/19 (L-LAD)  . Colon polyp 02/2012  . Dupuytren contracture    bilateral hands  . Family history of breast cancer   . Hyperlipidemia   . Hypertension   . Mitral regurgitation 11/07/2013   Echo 7/19: Mild LVH, EF 60-65, no RWMA, mod ot severe MR, massive LAE, PASP 33 // TEE 7/19:  Mild  conc LVH, EF 60-65, no RWMA, severe MR with mild post leaflet prolapse, massive // s/p MV repair 07/2018  . On continuous oral anticoagulation 08/22/2015   Started on Xarelto 08/12/2015   . Osteopenia   . Persistent atrial fibrillation ([place]) 08/22/2015   Started late August or early September 2016 // s/p Maze procedure 07/2018  . Personal history of radiation therapy   . Radiation Therapy 04/21/16-05/19/16  left breast 47.72 Gy, boosted to 10 Gy  . 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 S/P Maze operation for atrial fibrillation 08/10/2018   Complete bilateral atrial lesion set using bipolar radiofrequency and cryothermy ablation with clipping of LA appendage  . S/P MVR (mitral valve repair) 08/10/2018   Complex valvuloplasty including Gore-tex neochord placement x8, Plication of Lateral Commissure and 72m Sorin Memo 4D Ring Annuloplasty SN# [id number] . [place]ative [place]   . Wears glasses       Past Surgical History:  Procedure Laterality Date  . BIOPSY  07/14/2020   Procedure: BIOPSY;  Surgeon: KRonnette Juniper MD;  [facility];  Service: Gastroenterology;;  . BREAST BIOPSY Left 01/28/2016  . BREAST LUMPECTOMY Left 02/23/2016  . BREAST LUMPECTOMY WITH RADIOACTIVE SEED LOCALIZATION Left 02/23/2016   Procedure: BREAST LUMPECTOMY WITH RADIOACTIVE SEED LOCALIZATION;  Surgeon: BExcell Seltzer MD;  [place]  Service: General;  Laterality: Left;  . CARDIAC CATHETERIZATION    . CARDIOVERSION N/A 10/02/2015   Procedure: CARDIOVERSION;  Surgeon: MJerline Pain MD;  [facility];  Service: Cardiovascular;  Laterality: N/A;  . CARDIOVERSION N/A 10/05/2018   Procedure: CARDIOVERSION;  Surgeon: RFay Records MD;  [facility];  Service: Cardiovascular;  Laterality: N/A;  . CARDIOVERSION N/A 11/05/2019   Procedure: CARDIOVERSION;  Surgeon: NDorothy Spark MD;  [place]  Service: Cardiovascular;  Laterality: N/A;  .  CLIPPING OF ATRIAL APPENDAGE  08/10/2018   Procedure: CLIPPING OF ATRIAL APPENDAGE;  Surgeon: ORexene Alberts MD;  [place]  Service: Open Heart Surgery;;  . COLONOSCOPY    . COLONOSCOPY WITH PROPOFOL N/A 07/14/2020   Procedure: COLONOSCOPY WITH PROPOFOL;  Surgeon: KRonnette Juniper MD;  [facility];  Service: Gastroenterology;  Laterality: N/A;  . CORONARY ARTERY BYPASS GRAFT N/A 08/10/2018   Procedure: CORONARY ARTERY BYPASS GRAFTING (CABG) x 1, LIMA-LAD,  USING LEFT INTERNAL MAMMARY ARTERY. HARVESTED RIGHT GREATER SAPHENOUS VEIN ENDOSCOPICALLY;  Surgeon: ORexene Alberts MD;  [place]  Service: Open Heart Surgery;  Laterality: N/A;  . CYSTOSCOPY W/ RETROGRADES Bilateral 06/20/2019   Procedure: CYSTOSCOPY WITH RETROGRADE PYELOGRAM;  Surgeon: HArdis Hughs MD;  [facility]  Service: Urology;  Laterality: Bilateral;  . CYSTOSCOPY WITH HYDRODISTENSION AND BIOPSY N/A 06/20/2019   Procedure: CYSTOSCOPY BLADDER  BIOPSY WITH FULGERATION;  Surgeon: HArdis Hughs MD;  [facility]  Service: Urology;  Laterality: N/A;  . DUPUYTREN CONTRACTURE RELEASE  2001   leftx2  . [place]  right  . DUPUYTREN CONTRACTURE RELEASE Right 05/02/2014   Procedure: EXCISION DUPUYTRENS RIGHT PALMAR/SMALL ;  Surgeon: RCammie Sickle MD;  [place]  Service: Orthopedics;  Laterality: Right;  . HEMOSTASIS CLIP PLACEMENT  07/14/2020   Procedure: HEMOSTASIS CLIP PLACEMENT;  Surgeon: KRonnette Juniper MD;  [facility];  Service: Gastroenterology;;  . IR VERTEBROPLASTY CERV/THOR BX INC UNI/BIL INC/INJECT/IMAGING  08/19/2020  . IR VERTEBROPLASTY CERV/THOR BX INC UNI/BIL INC/INJECT/IMAGING  09/15/2020  . MAZE N/A 08/10/2018   Procedure: MAZE;  Surgeon: ORexene Alberts MD;  [place]  Service: Open Heart Surgery;  Laterality: N/A;  . MITRAL VALVE REPAIR N/A 08/10/2018    Procedure: MITRAL VALVE REPAIR (MVR);  Surgeon: ORexene Alberts MD;  [place]  Service: Open Heart Surgery;  Laterality: N/A;  glutaraldehyde  . POLYPECTOMY  07/14/2020  Procedure: POLYPECTOMY;  Surgeon: Ronnette Juniper, MD;  [facility];  Service: Gastroenterology;;  . RIGHT/LEFT HEART CATH AND CORONARY ANGIOGRAPHY N/A 06/20/2018   Procedure: RIGHT/LEFT HEART CATH AND CORONARY ANGIOGRAPHY;  Surgeon: [name], MD;  Location: [facility];  Service: Cardiovascular;  Laterality: N/A;  . TEE WITHOUT CARDIOVERSION N/A 06/20/2018   Procedure: TRANSESOPHAGEAL ECHOCARDIOGRAM (TEE);  Surgeon: Pixie Casino, MD;  [facility];  Service: Cardiovascular;  Laterality: N/A;  . TEE WITHOUT CARDIOVERSION N/A 08/10/2018   Procedure: TRANSESOPHAGEAL ECHOCARDIOGRAM (TEE);  Surgeon: Rexene Alberts, MD;  Location[place];  Service: Open Heart Surgery;  Laterality: N/A;  . TONSILLECTOMY  age 31    Social History:  Ambulatory   bed bound     reports that she quit smoking about 28 years ago. Her smoking use included cigarettes. She has a 20.00 pack-year smoking history. She has never used smokeless tobacco. She reports current alcohol use of about 3.0 standard drinks of alcohol per week. She reports that she does not use drugs.   Family History:   Family History  Problem Relation Age of Onset  . Cirrhosis Mother   . Breast cancer Mother 2  . Heart failure Father   . Heart attack Father   . Breast cancer Sister        early 48's  . Kidney Stones Sister        loss of kidney due to stones  . Osteoporosis Neg Hx     Allergies: Allergies  Allergen Reactions  . Shellfish Allergy Itching  . Amoxicillin-Pot Clavulanate Diarrhea     Prior to Admission medications   Medication Sig Start Date End Date Taking? Authorizing Provider  acetaminophen (TYLENOL) 500 MG tablet Take 500-1,000 mg by mouth every 6 (six) hours as needed for moderate pain or headache.    Yes  [provider]  allopurinol (ZYLOPRIM) 100 MG tablet Take 200 mg by mouth every evening.  06/21/13  Yes [provider]  atorvastatin (LIPITOR) 20 MG tablet TAKE 1 TABLET BY MOUTH EVERY DAY Patient taking differently: Take 20 mg by mouth daily. 04/07/20  Yes Camnitz, Will Hassell Done, MD  diltiazem (CARDIZEM CD) 180 MG 24 hr capsule Take 180 mg by mouth daily. 11/05/20  Yes [provider]  hydrOXYzine (ATARAX/VISTARIL) 25 MG tablet Take 25 mg by mouth in the morning and at bedtime. 01/14/21  Yes [provider]  mesalamine (APRISO) 0.375 g 24 hr capsule Take 0.375 g by mouth daily. 06/01/19  Yes [provider]  metoprolol tartrate (LOPRESSOR) 50 MG tablet Take 75 mg by mouth 2 (two) times daily. 01/21/21  Yes [provider]  Multiple Vitamin (MULTIVITAMIN WITH MINERALS) TABS tablet Take 1 tablet by mouth daily. 08/24/20  Yes Debbe Odea, MD  oxyCODONE (OXY IR/ROXICODONE) 5 MG immediate release tablet Take 1 tablet (5 mg total) by mouth every 6 (six) hours as needed for severe pain. 01/21/21  Yes Arrien, Jimmy Picket, MD  predniSONE (DELTASONE) 10 MG tablet Take 2 tablets daily for 7 days, then 1 tablet daily for 7 days, then half tablet daily for 7 days, then stop. 01/21/21  Yes Arrien, Jimmy Picket, MD  tamoxifen (NOLVADEX) 20 MG tablet Take 20 mg by mouth daily.   Yes [provider]  XARELTO 15 MG TABS tablet TAKE 1 TABLET BY MOUTH  DAILY WITH SUPPER Patient taking differently: Take 15 mg by mouth at bedtime. 10/06/20  Yes [name], MD  alendronate [place])  70 MG tablet Take 70 mg by mouth once a week. 09/24/20   [provider]  bisacodyl (DULCOLAX) 10 MG suppository Place 1 suppository (10 mg total) rectally daily as needed for moderate constipation. 09/19/20   Oswald Hillock, MD  cyclobenzaprine (FLEXERIL) 5 MG tablet Take 1 tablet (5 mg total) by mouth 3 (three) times daily as needed for muscle spasms. Patient not  taking: Reported on 01/24/2021 09/19/20   Oswald Hillock, MD  docusate sodium (COLACE) 100 MG capsule Take 1 capsule (100 mg total) by mouth 2 (two) times daily. Patient not taking: Reported on 01/24/2021 09/19/20   Oswald Hillock, MD  feeding supplement, ENSURE ENLIVE, (ENSURE ENLIVE) LIQD Take 237 mLs by mouth 2 (two) times daily between meals. Patient not taking: Reported on 01/24/2021 08/23/20   Debbe Odea, MD  Metoprolol Tartrate 75 MG TABS Take 75 mg by mouth 2 (two) times daily. Patient not taking: Reported on 01/24/2021 01/21/21 02/20/21  Arrien, Jimmy Picket, MD  thiamine 100 MG tablet Take 1 tablet (100 mg total) by mouth daily. 01/21/21 02/20/21  [name], MD   Physical Exam: Vitals with BMI 01/24/2021 01/24/2021 01/24/2021  Height - - -  Weight - - -  BMI - - -  Systolic 284 - 132  Diastolic 69 - 69  Pulse 440 98 101     1. General:  in No  Acute distress    Chronically ill -appearing 2. Psychological: Alert and Oriented 3. Head/ENT:      Dry Mucous Membranes                          Head Non traumatic, neck supple                        Poor Dentition 4. SKIN:  decreased Skin turgor,  Skin clean Dry multiple skin tears pressure ulcers right heel worse than left 5. Heart: Regular rate and rhythm noted Murmur, no Rub or gallop 6. Lungs: , no wheezes or crackles   7. Abdomen: Soft, non-tender, Non distended  bowel sounds present 8. Lower extremities: no clubbing, cyanosis, 2+ edema 9. Neurologically Grossly intact, moving all 4 extremities equally generalized weakness 10. MSK: Normal range of motion   All other LABS:     Recent Labs  Lab 01/18/21 0325 01/24/21 1610  WBC 5.2 13.8*  NEUTROABS  --  13.0*  HGB 8.5* 8.0*  HCT 26.8* 25.8*  MCV 96.1 99.2  PLT 143* 123*     Recent Labs  Lab 01/18/21 0325 01/19/21 0447 01/21/21 0442 01/24/21 1522  NA 141 139 138 139  K 4.2 3.6 4.4 4.4  CL 114* 110 109 106  CO2 19* 23 25 24   GLUCOSE 145* 116* 113* 117*  BUN 9  14 17 16   CREATININE 0.62 0.76 0.58 0.78  CALCIUM 7.2* 7.8* 8.2* 8.1*  MG 2.2  --   --   --      Recent Labs  Lab 01/24/21 1522  AST 80*  ALT 39  ALKPHOS 62  BILITOT 1.4*  PROT 5.2*  ALBUMIN 2.2*      Cultures:    Component Value Date/Time   SDES  12/01/2020 1633    BLOOD RIGHT ANTECUBITAL Performed at [facility], [place] [address] [place], [place] [address]    SDES  12/01/2020 1633    BLOOD BLOOD RIGHT FOREARM Performed at [facility], [place] [address], [place], [place] [address]    [place]  12/01/2020 1633    BOTTLES DRAWN AEROBIC AND ANAEROBIC Blood Culture adequate volume Performed at [facility] [address], [place], [place] [address]    [place]  12/01/2020 1633    BOTTLES DRAWN AEROBIC AND ANAEROBIC Blood Culture adequate volume Performed at [facility] [address], [place], [place] [address]    CULT  12/01/2020 1633    NO GROWTH 5 DAYS Performed at [facility] [address], [place], [place] [address]    CULT  12/01/2020 1633    NO GROWTH 5 DAYS Performed at [facility], [place] [address] [place], [place] [address]    REPTSTATUS 12/06/2020 FINAL 12/01/2020 1633   REPTSTATUS 12/06/2020 FINAL 12/01/2020 1633     Radiological Exams on Admission: DG Elbow 2 Views Right  Result Date: 01/24/2021 CLINICAL DATA:  Provided history of: Pain on the olcer Patient reports pain about the ulnar aspect of right elbow for 1 week. EXAM: RIGHT ELBOW - 2 VIEW COMPARISON:  None. FINDINGS: Frontal and lateral views. Normal alignment and joint spaces. No fracture, erosion, or bone destruction. There is a moderate elbow joint effusion. Soft tissue edema is most prominent about the posterior and ulnar aspect of the elbow. IMPRESSION: Moderate elbow joint effusion, nonspecific. Generalized soft tissue edema most prominent posterior medially. Electronically Signed   By: Keith Rake M.D.   On: 01/24/2021 17:26   DG Chest Port 1 View  Result Date: 01/24/2021 CLINICAL DATA:  Looking for infectious source. Recent UTI last week. Continued dysuria. Swelling and weeping of right upper arm. EXAM: PORTABLE CHEST 1 VIEW COMPARISON:  Chest x-ray 01/16/2021, CT chest 01/16/2021 FINDINGS: Left appendage clip, mitral annular valve replacement, as well as sternotomy wires are again noted. The heart size and mediastinal contours are within normal limits. Aortic arch calcifications. Biapical pleural/pulmonary scarring. No focal consolidation. Similar appearing coarsened interstitial markings. Persistent bilateral trace to small volume pleural effusions with streaky and patchy airspace opacities. No pneumothorax. No acute osseous abnormality. Mid to lower thoracic body kyphoplasty again noted. IMPRESSION: Persistent bilateral trace to small volume pleural effusions with underlying infection/inflammation not excluded. Electronically Signed   By: Iven Finn M.D.   On: 01/24/2021 15:55    Chart has been reviewed    Assessment/Plan   80 y.o. 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 fib of RVR and hypotension likely secondary to adrenal insufficiency  Present on Admission: . Adrenal insufficiency (Addison's disease) ([place]) -likely contributing to hypotension.  Will give hydrocortisone stress steroids and resume home medications if able.  Patient has been on chronic steroids in the past and likely will be somewhat steroid dependent  . Ulcerative colitis ([place]) -stable currently continue home medications  . Atrial fibrillation with RVR ([place]) -at this point unable to resume home medications given hypotension.  . Hypotension -most likely multifactorial combination of adrenal  insufficiency given chronic steroid use, poor p.o. intake also contributing initiated stress dose steroids as well as midodrine to see if can support blood pressure.  Also administer IV albumin.  Gentle IV fluids  .   [name] Elevated troponin-stable no chest pain or EKG changes to suggest ischemia Continue to monitor  . Chronic diastolic HF (heart failure) (HCC) gentle  IV fluids avoid over aggressive fluid resuscitation At this point appears to be intravascularly depleted and third spacing given hypoalbuminemia   . Breast cancer of upper-outer quadrant of left female breast ([place]) chronic stable continue tamoxifen  . Anemia of chronic disease obtain anemia panel stable  . Functional quadriplegia (HCC) PT OT assessment for level of care at the time of discharge  Pressure ulcers.  Noted heel ulcers bilaterally worse on the right.  Also for stage decubitus ulcer.  Order wound care consult  Other plan as per orders.  DVT prophylaxis:  xarelto     Code Status:    Code Status: Prior   DNR/DNI  as per patient   I had personally discussed CODE STATUS with patient    Family Communication:   Family not at  Bedside    Disposition Plan:                             Back to current facility when stable                            Following barriers for discharge:                            Electrolytes corrected                               Anemia stable                               o transition to PO antibiotics                             Will need to be able to tolerate PO                         Vitals stable                           Will need consultants to evaluate patient prior to discharge                     Would benefit from PT/OT eval prior to DC  Ordered                                     Transition of care consulted                   Nutrition    consulted                                      Consults called: Would notify cardiology through email patient has been readmitted  if she has difficult to manage A. fib with hypotension  Admission status:  ED Disposition    ED Disposition Condition Comment   Admit  The patient appears reasonably stabilized for admission considering the current resources, flow, and capabilities available in the ED at this time, and I doubt any other [facility] requiring further  screening and/or treatment in the ED prior to admission is  present.       Obs   Level of care   given persistent hypotension tachycardia admit to   SDU tele indefinitely please discontinue once patient no longer qualifies COVID-19 Labs    Lab Results  Component Value Date   [place] NEGATIVE 01/24/2021     Precautions: admitted as  Covid Negative   PPE: Used by the provider:   N95   eye Goggles,  Gloves       Ferron Ishmael 01/24/2021, 9:06 PM    [facility]     after 2 AM please page floor coverage PA If 7AM-7PM, please contact the day team taking care of the patient using [email or web address]   Patient was evaluated in the context of the global COVID-19 pandemic, which necessitated consideration that the patient might be at risk for infection with the SARS-CoV-2 virus that causes COVID-19. Institutional protocols and algorithms that pertain to the evaluation of patients at risk for COVID-19 are in a state of rapid change based on information released by regulatory bodies including the CDC and federal and state organizations. These policies and algorithms were followed during the patient's care.

## 2021-01-24 NOTE — ED Provider Notes (Signed)
[facility] DEPT Provider Note   CSN: [id number] Arrival date & time: 01/24/21  1409     History No chief complaint on file.   Michele Green is a 80 y.o. female.  HPI   Patient with  significant medical history of atrial fib currently on Xarelto, breast cancer, CAD, mitral valve repair, colitis, on 2 L via nasal cannula presents with chief complaint of urinary symptoms and hypotension.  Patient endorses that she has had continued burning sensation when he urinates.  She denies abdominal pain, nausea, vomiting, diarrhea, constipation, denies flank pain or lower back pain.  She also states that her blood pressure was noted to be low. she denies feeling lightheaded, dizziness, chest pain, shortness of breath or fatigued.  She has no other complaints at this time.  After reviewing patient's chart she was recently discharged in the hospital on 03/02, she was initially admitted for hypoxia with hypotension and she was found to be in adrenal insufficiency.  This was corrected after providing her with steroids and midodrine.  Patient was sent home with a steroid taper and told to follow-up.  Patient denies headaches, fevers, chills, shortness of breath, chest pain, abdominal pain, nausea, vomiting, diarrhea, worsening pedal edema.  Past Medical History:  Diagnosis Date  . Arrhythmia   . Atypical atrial flutter ([place]) 08/14/2018   Post-operative  . Breast cancer of upper-outer quadrant of left female breast ([place]) 02/02/2016  . CAD (coronary artery disease) 06/20/2018   LHC 7/19: pLAD 65/90, oD1 90, mLCx 85, OM2 50, oRCA 30, EF 50-55 >> s/p CABG in 9/19 (L-LAD)  . Colon polyp 02/2012  . Dupuytren contracture    bilateral hands  . Family history of breast cancer   . Hyperlipidemia   . Hypertension   . Mitral regurgitation 11/07/2013   Echo 7/19: Mild LVH, EF 60-65, no RWMA, mod ot severe MR, massive LAE, PASP 33 // TEE 7/19:  Mild conc LVH, EF 60-65, no RWMA, severe  MR with mild post leaflet prolapse, massive // s/p MV repair 07/2018  . On continuous oral anticoagulation 08/22/2015   Started on Xarelto 08/12/2015   . Osteopenia   . Persistent atrial fibrillation ([place]) 08/22/2015   Started late August or early September 2016 // s/p Maze procedure 07/2018  . Personal history of radiation therapy   . Radiation Therapy 04/21/16-05/19/16   left breast 47.72 Gy, boosted to 10 Gy  . 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 S/P Maze operation for atrial fibrillation 08/10/2018   Complete bilateral atrial lesion set using bipolar radiofrequency and cryothermy ablation with clipping of LA appendage  . S/P MVR (mitral valve repair) 08/10/2018   Complex valvuloplasty including Gore-tex neochord placement x8, Plication of Lateral Commissure and 14m Sorin Memo 4D Ring Annuloplasty SN# [id number] . Ulcerative colitis ([place]   . Wears glasses     Patient Active Problem List   Diagnosis Date Noted  . Hypotension 01/17/2021  . Elevated troponin 01/17/2021  . Debility 01/17/2021  . Chronic pain 01/17/2021  . Ulcerative colitis ([place] 01/17/2021  . Chronic respiratory failure with hypoxia ([place] 12/03/2020  . Pressure injury of skin 12/02/2020  . Pneumonia due to COVID-19 virus 12/01/2020  . T8 vertebral fracture ([place] 09/13/2020  . Compression fracture of C-spine ([place] 09/12/2020  . Compression fracture of thoracic spine, non-traumatic ([place] 09/12/2020  . Goals of care, counseling/discussion   . Palliative care by specialist   . Spinal fracture of  T7 vertebra ([place]) 08/17/2020  . Atrial fibrillation with RVR ([place]) 08/16/2020  . Long term (current) use of anticoagulants 08/28/2018  . Atrial flutter with rapid ventricular response ([place]) 08/14/2018  . 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 S/P Maze operation for atrial fibrillation 08/10/2018  . Chronic diastolic HF (heart failure) ([place]) 06/20/2018  . Osteoporosis 09/29/2017  . Loosening of  hardware in spine ([place]) 08/23/2017  . Spinal stenosis of lumbar region 08/11/2016  . Unspecified cord compression ([place]) 08/11/2016  . Unstable burst fracture of third lumbar vertebra ([place]) 08/11/2016  . Genetic testing 03/17/2016  . Family history of breast cancer   . Breast cancer of upper-outer quadrant of left female breast ([place]) 02/02/2016  . On amiodarone therapy 09/09/2015  . On continuous oral anticoagulation 08/22/2015  . Persistent atrial fibrillation ([place]) 08/22/2015  . Essential hypertension 11/07/2013  . Mitral regurgitation 11/07/2013    Past Surgical History:  Procedure Laterality Date  . BIOPSY  07/14/2020   Procedure: BIOPSY;  Surgeon: Ronnette Juniper, MD;  Location: [facility];  Service: Gastroenterology;;  . BREAST BIOPSY Left 01/28/2016  . BREAST LUMPECTOMY Left 02/23/2016  . BREAST LUMPECTOMY WITH RADIOACTIVE SEED LOCALIZATION Left 02/23/2016   Procedure: BREAST LUMPECTOMY WITH RADIOACTIVE SEED LOCALIZATION;  Surgeon: [name], MD;  Location: [facility];  Service: General;  Laterality: Left;  . CARDIAC CATHETERIZATION    . CARDIOVERSION N/A 10/02/2015   Procedure: CARDIOVERSION;  Surgeon: Jerline Pain, MD;  Location: [facility] ENDOSCOPY;  Service: Cardiovascular;  Laterality: N/A;  . CARDIOVERSION N/A 10/05/2018   Procedure: CARDIOVERSION;  Surgeon: Fay Records, MD;  Location: [facility] ENDOSCOPY;  Service: Cardiovascular;  Laterality: N/A;  . CARDIOVERSION N/A 11/05/2019   Procedure: CARDIOVERSION;  Surgeon: Dorothy Spark, MD;  Location: [facility];  Service: Cardiovascular;  Laterality: N/A;  . CLIPPING OF ATRIAL APPENDAGE  08/10/2018   Procedure: CLIPPING OF ATRIAL APPENDAGE;  Surgeon: Rexene Alberts, MD;  Location: [facility];  Service: Open Heart Surgery;;  . COLONOSCOPY    . COLONOSCOPY WITH PROPOFOL N/A 07/14/2020   Procedure: COLONOSCOPY WITH PROPOFOL;  Surgeon: Ronnette Juniper, MD;  Location: [facility];  Service: Gastroenterology;  Laterality:  N/A;  . CORONARY ARTERY BYPASS GRAFT N/A 08/10/2018   Procedure: CORONARY ARTERY BYPASS GRAFTING (CABG) x 1, LIMA-LAD,  USING LEFT INTERNAL MAMMARY ARTERY. HARVESTED RIGHT GREATER SAPHENOUS VEIN ENDOSCOPICALLY;  Surgeon: Rexene Alberts, MD;  Location: [facility] [place];  Service: Open Heart Surgery;  Laterality: N/A;  . CYSTOSCOPY W/ RETROGRADES Bilateral 06/20/2019   Procedure: CYSTOSCOPY WITH RETROGRADE PYELOGRAM;  Surgeon: Ardis Hughs, MD;  Location[facility];  Service: Urology;  Laterality: Bilateral;  . CYSTOSCOPY WITH HYDRODISTENSION AND BIOPSY N/A 06/20/2019   Procedure: CYSTOSCOPY BLADDER  BIOPSY WITH FULGERATION;  Surgeon: Ardis Hughs, MD;  Location: [facility];  Service: Urology;  Laterality: N/A;  . DUPUYTREN CONTRACTURE RELEASE  2001   leftx2  . [place]   right  . DUPUYTREN CONTRACTURE RELEASE Right 05/02/2014   Procedure: EXCISION DUPUYTRENS RIGHT PALMAR/SMALL ;  Surgeon: Cammie Sickle, MD;  Location: [facility];  Service: Orthopedics;  Laterality: Right;  . HEMOSTASIS CLIP PLACEMENT  07/14/2020   Procedure: HEMOSTASIS CLIP PLACEMENT;  Surgeon: Ronnette Juniper, MD;  Location: [facility] ENDOS[facility];  Service: Gastroenterology;;  . IR VERTEBROPLASTY CERV/THOR BX INC UNI/BIL INC/INJECT/IMAGING  08/19/2020  . IR VERTEBROPLASTY CERV/THOR BX INC UNI/BIL INC/INJECT/IMAGING  09/15/2020  . MAZE N/A 08/10/2018   Procedure: MAZE;  Surgeon: Rexene Alberts, MD;  Location: [facility];  Service: Open Heart Surgery;  Laterality: N/A;  . MITRAL VALVE REPAIR N/A 08/10/2018   Procedure: MITRAL VALVE REPAIR (MVR);  Surgeon: Rexene Alberts, MD;  Location: [facility];  Service: Open Heart Surgery;  Laterality: N/A;  glutaraldehyde  . POLYPECTOMY  07/14/2020   Procedure: POLYPECTOMY;  Surgeon: Ronnette Juniper, MD;  Location: [facility];  Service: Gastroenterology;;  . RIGHT/LEFT HEART CATH AND CORONARY ANGIOGRAPHY N/A 06/20/2018   Procedure:  RIGHT/LEFT HEART CATH AND CORONARY ANGIOGRAPHY;  Surgeon: [name], MD;  Location: [facility] CV LAB;  Service: Cardiovascular;  Laterality: N/A;  . TEE WITHOUT CARDIOVERSION N/A 06/20/2018   Procedure: TRANSESOPHAGEAL ECHOCARDIOGRAM (TEE);  Surgeon: Pixie Casino, MD;  Location: [facility] ENDOSCOPY;  Service: Cardiovascular;  Laterality: N/A;  . TEE WITHOUT CARDIOVERSION N/A 08/10/2018   Procedure: TRANSESOPHAGEAL ECHOCARDIOGRAM (TEE);  Surgeon: Rexene Alberts, MD;  Location: [facility];  Service: Open Heart Surgery;  Laterality: N/A;  . TONSILLECTOMY  age 43     OB History    Gravida  1   Para  1   Term  1   Preterm      AB      Living  1     SAB      IAB      Ectopic      Multiple      Live Births              Family History  Problem Relation Age of Onset  . Cirrhosis Mother   . Breast cancer Mother 46  . Heart failure Father   . Heart attack Father   . Breast cancer Sister        early 59's  . Kidney Stones Sister        loss of kidney due to stones  . Osteoporosis Neg Hx     Social History   Tobacco Use  . Smoking status: Former Smoker    Packs/day: 1.00    Years: 20.00    Pack years: 20.00    Types: Cigarettes    Quit date: 11/22/1992    Years since quitting: 28.1  . Smokeless tobacco: Never Used  Vaping Use  . Vaping Use: Never used  Substance Use Topics  . Alcohol use: Yes    Alcohol/week: 3.0 standard drinks    Types: 3 Standard drinks or equivalent per week    Comment: social  . Drug use: No    Home Medications Prior to Admission medications   Medication Sig Start Date End Date Taking? Authorizing Provider  acetaminophen (TYLENOL) 500 MG tablet Take 500-1,000 mg by mouth every 6 (six) hours as needed for moderate pain or headache.    Yes [provider]  allopurinol (ZYLOPRIM) 100 MG tablet Take 200 mg by mouth every evening.  06/21/13  Yes [provider]  atorvastatin (LIPITOR) 20 MG tablet TAKE 1 TABLET BY MOUTH  EVERY DAY Patient taking differently: Take 20 mg by mouth daily. 04/07/20  Yes Camnitz, Will Hassell Done, MD  diltiazem (CARDIZEM CD) 180 MG 24 hr capsule Take 180 mg by mouth daily. 11/05/20  Yes [provider]  hydrOXYzine (ATARAX/VISTARIL) 25 MG tablet Take 25 mg by mouth in the morning and at bedtime. 01/14/21  Yes [provider]  mesalamine (APRISO) 0.375 g 24 hr capsule Take 0.375 g by mouth daily. 06/01/19  Yes [provider]  metoprolol tartrate (LOPRESSOR) 50 MG tablet Take 75 mg by mouth 2 (two) times daily. 01/21/21  Yes [provider]  Multiple Vitamin (MULTIVITAMIN WITH MINERALS) TABS tablet Take 1 tablet by mouth daily. 08/24/20  Yes Debbe Odea, MD  oxyCODONE (OXY IR/ROXICODONE) 5 MG immediate release tablet Take 1 tablet (5 mg total) by mouth every 6 (six) hours as needed for severe pain. 01/21/21  Yes Arrien, Jimmy Picket, MD  predniSONE (DELTASONE) 10 MG tablet Take 2 tablets daily for 7 days, then 1 tablet daily for 7 days, then half tablet daily for 7 days, then stop. 01/21/21  Yes Arrien, Jimmy Picket, MD  tamoxifen (NOLVADEX) 20 MG tablet Take 20 mg by mouth daily.   Yes [provider]  XARELTO 15 MG TABS tablet TAKE 1 TABLET BY MOUTH  DAILY WITH SUPPER Patient taking differently: Take 15 mg by mouth at bedtime. 10/06/20  Yes [name], MD  alendronate (FOSAMAX) 70 MG tablet Take 70 mg by mouth once a week. 09/24/20   [provider]  bisacodyl (DULCOLAX) 10 MG suppository Place 1 suppository (10 mg total) rectally daily as needed for moderate constipation. 09/19/20   Oswald Hillock, MD  cyclobenzaprine (FLEXERIL) 5 MG tablet Take 1 tablet (5 mg total) by mouth 3 (three) times daily as needed for muscle spasms. Patient not taking: Reported on 01/24/2021 09/19/20   Oswald Hillock, MD  docusate sodium (COLACE) 100 MG capsule Take 1 capsule (100 mg total) by mouth 2 (two) times daily. Patient not taking: Reported on 01/24/2021  09/19/20   Oswald Hillock, MD  feeding supplement, ENSURE ENLIVE, (ENSURE ENLIVE) LIQD Take 237 mLs by mouth 2 (two) times daily between meals. Patient not taking: Reported on 01/24/2021 08/23/20   Debbe Odea, MD  Metoprolol Tartrate 75 MG TABS Take 75 mg by mouth 2 (two) times daily. Patient not taking: Reported on 01/24/2021 01/21/21 02/20/21  Arrien, Jimmy Picket, MD  thiamine 100 MG tablet Take 1 tablet (100 mg total) by mouth daily. 01/21/21 02/20/21  Arrien, Jimmy Picket, MD    Allergies    Shellfish allergy and Amoxicillin-pot clavulanate  Review of Systems   Review of Systems  Constitutional: Negative for chills and fever.  HENT: Negative for congestion and sore throat.   Respiratory: Negative for shortness of breath.   Cardiovascular: Negative for chest pain.  Gastrointestinal: Negative for abdominal pain.  Genitourinary: Positive for dysuria and hematuria. Negative for decreased urine volume, enuresis, vaginal bleeding, vaginal discharge and vaginal pain.  Musculoskeletal: Negative for back pain.  Skin: Negative for rash.  Neurological: Negative for headaches.  Hematological: Does not bruise/bleed easily.    Physical Exam Updated Vital Signs BP 106/69   Pulse (!) 116   Temp 99.1 F (37.3 C) (Rectal)   Resp (!) 21   SpO2 100%   Physical Exam Vitals and nursing note reviewed.  Constitutional:      General: She is not in acute distress.    Appearance: She is not ill-appearing.     Comments: Chronic deconditioned state  HENT:     Head: Normocephalic and atraumatic.     Nose: No congestion.     Mouth/Throat:     Mouth: Mucous membranes are dry.     Pharynx: Oropharynx is clear. No oropharyngeal exudate or posterior oropharyngeal erythema.  Eyes:     Conjunctiva/sclera: Conjunctivae normal.  Cardiovascular:     Rate and Rhythm: Tachycardia present. Rhythm irregular.  Pulses: Normal pulses.     Heart sounds: No murmur heard. No friction rub. No gallop.    Pulmonary:     Effort: No respiratory distress.     Breath sounds: No wheezing, rhonchi or rales.  Abdominal:     Palpations: Abdomen is soft.     Tenderness: There is no abdominal tenderness. There is no right CVA tenderness or left CVA tenderness.  Musculoskeletal:     Right lower leg: Edema present.     Left lower leg: Edema present.     Comments: Patient has noted 2+ pitting edema up into the shins, chronic skin changes due to lymphedema,  neurovascularly intact, moving all 4 extremities at difficulty.    She has noted tenderness along her right elbow, it was erythematous, clear drainage present, warm to the touch, has full range of motion at her elbow wrist and hand neurovascular fully intact.  Skin:    General: Skin is warm and dry.     Comments: Patient has noted pressure ulcers on her sacrum stage I, no skin breakdown present, no signs infection present.  Patient is noted stage II pressure ulcer on her right heel, no signs of infection present.  Neurological:     General: No focal deficit present.     Mental Status: She is alert.  Psychiatric:        Mood and Affect: Mood normal.     ED Results / Procedures / Treatments   Labs (all labs ordered are listed, but only abnormal results are displayed) Labs Reviewed  COMPREHENSIVE METABOLIC PANEL - Abnormal; Notable for the following components:      Result Value   Glucose, Bld 117 (*)    Calcium 8.1 (*)    Total Protein 5.2 (*)    Albumin 2.2 (*)    AST 80 (*)    Total Bilirubin 1.4 (*)    All other components within normal limits  LACTIC ACID, PLASMA - Abnormal; Notable for the following components:   Lactic Acid, Venous 2.1 (*)    All other components within normal limits  URINALYSIS, ROUTINE W REFLEX MICROSCOPIC - Abnormal; Notable for the following components:   APPearance HAZY (*)    Leukocytes,Ua TRACE (*)    Bacteria, UA RARE (*)    All other components within normal limits  CBC WITH DIFFERENTIAL/PLATELET -  Abnormal; Notable for the following components:   WBC 13.8 (*)    RBC 2.60 (*)    Hemoglobin 8.0 (*)    HCT 25.8 (*)    RDW 15.9 (*)    Platelets 123 (*)    Neutro Abs 13.0 (*)    Lymphs Abs 0.4 (*)    Abs Immature Granulocytes 0.11 (*)    All other components within normal limits  BRAIN NATRIURETIC PEPTIDE - Abnormal; Notable for the following components:   B Natriuretic Peptide 478.7 (*)    All other components within normal limits  TROPONIN I (HIGH SENSITIVITY) - Abnormal; Notable for the following components:   Troponin I (High Sensitivity) 25 (*)    All other components within normal limits  TROPONIN I (HIGH SENSITIVITY) - Abnormal; Notable for the following components:   Troponin I (High Sensitivity) 24 (*)    All other components within normal limits  CULTURE, BLOOD (ROUTINE X 2)  CULTURE, BLOOD (ROUTINE X 2)  RESP PANEL BY RT-PCR (FLU A&B, COVID) ARPGX2  LACTIC ACID, PLASMA  CBC WITH DIFFERENTIAL/PLATELET  CORTISOL    EKG EKG Interpretation  Date/Time:  Saturday January 24 2021 15:31:41 EST Ventricular Rate:  146 PR Interval:    QRS Duration: 81 QT Interval:  265 QTC Calculation: 413 R Axis:   98 Text Interpretation: Atrial flutter with rapid ventricular response Right axis deviation Borderline low voltage, extremity leads Repolarization abnormality, prob rate related Confirmed by Blanchie Dessert [phone]) on 01/24/2021 3:38:36 PM   Radiology DG Elbow 2 Views Right  Result Date: 01/24/2021 CLINICAL DATA:  Provided history of: Pain on the olcer Patient reports pain about the ulnar aspect of right elbow for 1 week. EXAM: RIGHT ELBOW - 2 VIEW COMPARISON:  None. FINDINGS: Frontal and lateral views. Normal alignment and joint spaces. No fracture, erosion, or bone destruction. There is a moderate elbow joint effusion. Soft tissue edema is most prominent about the posterior and ulnar aspect of the elbow. IMPRESSION: Moderate elbow joint effusion, nonspecific. Generalized soft  tissue edema most prominent posterior medially. Electronically Signed   By: Keith Rake M.D.   On: 01/24/2021 17:26   DG Chest Port 1 View  Result Date: 01/24/2021 CLINICAL DATA:  Looking for infectious source. Recent UTI last week. Continued dysuria. Swelling and weeping of right upper arm. EXAM: PORTABLE CHEST 1 VIEW COMPARISON:  Chest x-ray 01/16/2021, CT chest 01/16/2021 FINDINGS: Left appendage clip, mitral annular valve replacement, as well as sternotomy wires are again noted. The heart size and mediastinal contours are within normal limits. Aortic arch calcifications. Biapical pleural/pulmonary scarring. No focal consolidation. Similar appearing coarsened interstitial markings. Persistent bilateral trace to small volume pleural effusions with streaky and patchy airspace opacities. No pneumothorax. No acute osseous abnormality. Mid to lower thoracic body kyphoplasty again noted. IMPRESSION: Persistent bilateral trace to small volume pleural effusions with underlying infection/inflammation not excluded. Electronically Signed   By: Iven Finn M.D.   On: 01/24/2021 15:55    Procedures .Critical Care Performed by: Marcello Fennel, PA-C Authorized by: Marcello Fennel, PA-C   Critical care provider statement:    Critical care time (minutes):  45   Critical care time was exclusive of:  Separately billable procedures and treating other patients   Critical care was necessary to treat or prevent imminent or life-threatening deterioration of the following conditions:  Circulatory failure and sepsis   Critical care was time spent personally by me on the following activities:  Discussions with consultants, evaluation of patient's response to treatment, examination of patient, ordering and performing treatments and interventions, ordering and review of laboratory studies, ordering and review of radiographic studies, pulse oximetry, re-evaluation of patient's condition, obtaining history from  patient or surrogate and review of old charts   I assumed direction of critical care for this patient from another provider in my specialty: no       Medications Ordered in ED Medications  diltiazem (CARDIZEM) 125 mg in dextrose 5% 125 mL (1 mg/mL) infusion (has no administration in time range)  lactated ringers bolus 500 mL (0 mLs Intravenous Stopped 01/24/21 1603)  lactated ringers bolus 500 mL (0 mLs Intravenous Stopped 01/24/21 1735)  cefTRIAXone (ROCEPHIN) 1 g in sodium chloride 0.9 % 100 mL IVPB (0 g Intravenous Stopped 01/24/21 1808)    ED Course  I have reviewed the triage vital signs and the nursing notes.  Pertinent labs & imaging results that were available during my care of the patient were reviewed by me and considered in my medical decision making (see chart for details).    MDM Rules/Calculators/A&P  Initial impression-patient presents with dysuria and low blood pressure.  She is alert, vital signs notable for hypotension and initial fib rates ranging between 101 40.  Concern for sepsis versus fluid overload versus adrenal insufficiency.  Will start patient on fluids, obtain sepsis lab work-up and continue to evaluate.  Work-up-CBC shows leukocytosis of 13.8, normocytic anemia hemoglobin of 8 appears to be at baseline for patient.  CMP shows hyperglycemia 117, hypoalbuminemia 2.2, elevated liver enzymes of 80, slight elevated T bili of 1.4 appears to be a baseline for patient.  First troponin is 25.  Lactic 2.2.  UA shows trace leukocytes, 6-10 white blood cells, rare bacteria.  Squamous cells present.  Chest x-ray shows persistent bilateral trace to small amount of pleural effusions with underlying infection/inflammation.  EKG is a flutter with RVR no signs of ischemia present.  Reassessment patient was reassessed after providing her with first 500 cc of fluids, vital signs have improved, lung sounds are continue be clear bilaterally no respiratory distress  at this time.  Will provide patient with another 500 cc of fluids and reevaluate.  Going to cover her prophylactically for infection with ceftriaxone I suspect sources secondary due to UTI.  Consult-we will consult hospitalist for admission due to SARS with unknown source versus adrenal insufficiency.  Spoke with Dr. Helyn App who has agreed to except the patient and will come down and evaluate her.  Rule out-low suspicion for ACS as patient has chest pain, shortness of breath, EKG negative for signs of ischemia,  troponin is 25,I suspect elevation in troponin is secondary due to end-stage renal disease, appears to be chronically elevated.  Low suspicion for CHF exacerbation as lung sounds are clear bilaterally, BNP is only slightly elevated from baseline 478.  I suspect edema noted in her legs and her arms are secondary due to lymphedema and chronic hypoalbuminemia.  Low suspicion for unstable A. fib as patient denies shortness of breath, chest pain, patient is currently mentating without difficulty.  She is noted to be tachycardic but I suspect patient's tachycardia, hypotension is multifactorial, possibly from dehydration as she appeared dry on my exam,  improved with fluids, but I also suspect she may be suffering from an underlying infection, with an unknown source versus adrenal insufficiency I am more partial to adrenal insufficiency as there is no source of infection on my exam and she was recently diagnosed with adrenal insufficiency few days ago and had a similar presentation.      Final Clinical Impression(s) / ED Diagnoses Final diagnoses:  SIRS (systemic inflammatory response syndrome) ([place])  Lymphedema    Rx / DC Orders ED Discharge Orders    None       Aron Baba 01/24/21 Kathaleen Maser    Lacretia Leigh, MD 01/28/21 1025

## 2021-01-24 NOTE — ED Triage Notes (Addendum)
Per EMS, patient from [facility], recently treated for UTI in the last week. C/o continued dysuria. Wound to left foot for months. Swelling and weeping to right upper arm. Yellow drainage from arm. Hx Afib. A&Ox4.  RR 24 HR 100-140 BP 100/54  22g L FA 393m NS with EMS

## 2021-01-24 NOTE — ED Notes (Signed)
External female catheter attached to patient Clean/dry/intact

## 2021-01-25 ENCOUNTER — Encounter (HOSPITAL_COMMUNITY): Payer: Self-pay | Admitting: Internal Medicine

## 2021-01-25 DIAGNOSIS — Z743 Need for continuous supervision: Secondary | ICD-10-CM | POA: Diagnosis not present

## 2021-01-25 DIAGNOSIS — Z7901 Long term (current) use of anticoagulants: Secondary | ICD-10-CM | POA: Diagnosis not present

## 2021-01-25 DIAGNOSIS — Z66 Do not resuscitate: Secondary | ICD-10-CM | POA: Diagnosis not present

## 2021-01-25 DIAGNOSIS — Z1501 Genetic susceptibility to malignant neoplasm of breast: Secondary | ICD-10-CM | POA: Diagnosis not present

## 2021-01-25 DIAGNOSIS — I484 Atypical atrial flutter: Secondary | ICD-10-CM | POA: Diagnosis not present

## 2021-01-25 DIAGNOSIS — R6510 Systemic inflammatory response syndrome (SIRS) of non-infectious origin without acute organ dysfunction: Secondary | ICD-10-CM | POA: Diagnosis not present

## 2021-01-25 DIAGNOSIS — R532 Functional quadriplegia: Secondary | ICD-10-CM | POA: Diagnosis not present

## 2021-01-25 DIAGNOSIS — E8809 Other disorders of plasma-protein metabolism, not elsewhere classified: Secondary | ICD-10-CM | POA: Diagnosis present

## 2021-01-25 DIAGNOSIS — R279 Unspecified lack of coordination: Secondary | ICD-10-CM | POA: Diagnosis not present

## 2021-01-25 DIAGNOSIS — D696 Thrombocytopenia, unspecified: Secondary | ICD-10-CM | POA: Diagnosis present

## 2021-01-25 DIAGNOSIS — I272 Pulmonary hypertension, unspecified: Secondary | ICD-10-CM | POA: Diagnosis present

## 2021-01-25 DIAGNOSIS — I951 Orthostatic hypotension: Secondary | ICD-10-CM | POA: Diagnosis not present

## 2021-01-25 DIAGNOSIS — I4819 Other persistent atrial fibrillation: Secondary | ICD-10-CM | POA: Diagnosis not present

## 2021-01-25 DIAGNOSIS — I110 Hypertensive heart disease with heart failure: Secondary | ICD-10-CM | POA: Diagnosis not present

## 2021-01-25 DIAGNOSIS — I2510 Atherosclerotic heart disease of native coronary artery without angina pectoris: Secondary | ICD-10-CM | POA: Diagnosis not present

## 2021-01-25 DIAGNOSIS — I959 Hypotension, unspecified: Secondary | ICD-10-CM | POA: Diagnosis not present

## 2021-01-25 DIAGNOSIS — M25521 Pain in right elbow: Secondary | ICD-10-CM | POA: Diagnosis not present

## 2021-01-25 DIAGNOSIS — N390 Urinary tract infection, site not specified: Secondary | ICD-10-CM | POA: Diagnosis not present

## 2021-01-25 DIAGNOSIS — Z8616 Personal history of COVID-19: Secondary | ICD-10-CM | POA: Diagnosis not present

## 2021-01-25 DIAGNOSIS — J9611 Chronic respiratory failure with hypoxia: Secondary | ICD-10-CM | POA: Diagnosis not present

## 2021-01-25 DIAGNOSIS — R5381 Other malaise: Secondary | ICD-10-CM | POA: Diagnosis not present

## 2021-01-25 DIAGNOSIS — E271 Primary adrenocortical insufficiency: Secondary | ICD-10-CM | POA: Diagnosis not present

## 2021-01-25 DIAGNOSIS — K5190 Ulcerative colitis, unspecified, without complications: Secondary | ICD-10-CM | POA: Diagnosis not present

## 2021-01-25 DIAGNOSIS — Z7189 Other specified counseling: Secondary | ICD-10-CM | POA: Diagnosis not present

## 2021-01-25 DIAGNOSIS — E785 Hyperlipidemia, unspecified: Secondary | ICD-10-CM | POA: Diagnosis present

## 2021-01-25 DIAGNOSIS — C50412 Malignant neoplasm of upper-outer quadrant of left female breast: Secondary | ICD-10-CM | POA: Diagnosis not present

## 2021-01-25 DIAGNOSIS — Z7401 Bed confinement status: Secondary | ICD-10-CM | POA: Diagnosis not present

## 2021-01-25 DIAGNOSIS — M25421 Effusion, right elbow: Secondary | ICD-10-CM | POA: Diagnosis not present

## 2021-01-25 DIAGNOSIS — Z515 Encounter for palliative care: Secondary | ICD-10-CM | POA: Diagnosis not present

## 2021-01-25 DIAGNOSIS — I4891 Unspecified atrial fibrillation: Secondary | ICD-10-CM | POA: Diagnosis not present

## 2021-01-25 DIAGNOSIS — I9589 Other hypotension: Secondary | ICD-10-CM | POA: Diagnosis not present

## 2021-01-25 DIAGNOSIS — F419 Anxiety disorder, unspecified: Secondary | ICD-10-CM | POA: Diagnosis present

## 2021-01-25 DIAGNOSIS — I5032 Chronic diastolic (congestive) heart failure: Secondary | ICD-10-CM | POA: Diagnosis not present

## 2021-01-25 DIAGNOSIS — E876 Hypokalemia: Secondary | ICD-10-CM | POA: Diagnosis not present

## 2021-01-25 DIAGNOSIS — D638 Anemia in other chronic diseases classified elsewhere: Secondary | ICD-10-CM | POA: Diagnosis not present

## 2021-01-25 DIAGNOSIS — I890 Lymphedema, not elsewhere classified: Secondary | ICD-10-CM | POA: Diagnosis present

## 2021-01-25 DIAGNOSIS — I5033 Acute on chronic diastolic (congestive) heart failure: Secondary | ICD-10-CM | POA: Diagnosis not present

## 2021-01-25 DIAGNOSIS — I4821 Permanent atrial fibrillation: Secondary | ICD-10-CM | POA: Diagnosis not present

## 2021-01-25 DIAGNOSIS — R778 Other specified abnormalities of plasma proteins: Secondary | ICD-10-CM | POA: Diagnosis not present

## 2021-01-25 DIAGNOSIS — I471 Supraventricular tachycardia: Secondary | ICD-10-CM | POA: Diagnosis not present

## 2021-01-25 LAB — CBC WITH DIFFERENTIAL/PLATELET
Abs Immature Granulocytes: 0.1 10*3/uL — ABNORMAL HIGH (ref 0.00–0.07)
Basophils Absolute: 0 10*3/uL (ref 0.0–0.1)
Basophils Relative: 0 %
Eosinophils Absolute: 0 10*3/uL (ref 0.0–0.5)
Eosinophils Relative: 0 %
HCT: 24.4 % — ABNORMAL LOW (ref 36.0–46.0)
Hemoglobin: 7.3 g/dL — ABNORMAL LOW (ref 12.0–15.0)
Immature Granulocytes: 1 %
Lymphocytes Relative: 5 %
Lymphs Abs: 0.4 10*3/uL — ABNORMAL LOW (ref 0.7–4.0)
MCH: 29.9 pg (ref 26.0–34.0)
MCHC: 29.9 g/dL — ABNORMAL LOW (ref 30.0–36.0)
MCV: 100 fL (ref 80.0–100.0)
Monocytes Absolute: 0.2 10*3/uL (ref 0.1–1.0)
Monocytes Relative: 3 %
Neutro Abs: 7.9 10*3/uL — ABNORMAL HIGH (ref 1.7–7.7)
Neutrophils Relative %: 91 %
Platelets: 107 10*3/uL — ABNORMAL LOW (ref 150–400)
RBC: 2.44 MIL/uL — ABNORMAL LOW (ref 3.87–5.11)
RDW: 15.7 % — ABNORMAL HIGH (ref 11.5–15.5)
WBC: 8.6 10*3/uL (ref 4.0–10.5)
nRBC: 0 % (ref 0.0–0.2)

## 2021-01-25 LAB — COMPREHENSIVE METABOLIC PANEL
ALT: 30 U/L (ref 0–44)
AST: 32 U/L (ref 15–41)
Albumin: 2.3 g/dL — ABNORMAL LOW (ref 3.5–5.0)
Alkaline Phosphatase: 45 U/L (ref 38–126)
Anion gap: 6 (ref 5–15)
BUN: 12 mg/dL (ref 8–23)
CO2: 26 mmol/L (ref 22–32)
Calcium: 7.8 mg/dL — ABNORMAL LOW (ref 8.9–10.3)
Chloride: 108 mmol/L (ref 98–111)
Creatinine, Ser: 0.56 mg/dL (ref 0.44–1.00)
GFR, Estimated: >60 mL/min (ref >60)
Glucose, Bld: 118 mg/dL — ABNORMAL HIGH (ref 70–99)
Potassium: 3.4 mmol/L — ABNORMAL LOW (ref 3.5–5.1)
Sodium: 140 mmol/L (ref 135–145)
Total Bilirubin: 0.9 mg/dL (ref 0.3–1.2)
Total Protein: 4.8 g/dL — ABNORMAL LOW (ref 6.5–8.1)

## 2021-01-25 LAB — IRON AND TIBC
Iron: 54 ug/dL (ref 28–170)
Saturation Ratios: 52 % — ABNORMAL HIGH (ref 10.4–31.8)
TIBC: 104 ug/dL — ABNORMAL LOW (ref 250–450)
UIBC: 50 ug/dL

## 2021-01-25 LAB — FERRITIN: Ferritin: 146 ng/mL (ref 11–307)

## 2021-01-25 LAB — FOLATE: Folate: 9.5 ng/mL (ref >5.9)

## 2021-01-25 LAB — URIC ACID: Uric Acid, Serum: 3.7 mg/dL (ref 2.5–7.1)

## 2021-01-25 LAB — CORTISOL: Cortisol, Plasma: 11.7 ug/dL

## 2021-01-25 LAB — TSH: TSH: 0.69 u[IU]/mL (ref 0.350–4.500)

## 2021-01-25 LAB — MAGNESIUM: Magnesium: 1.8 mg/dL (ref 1.7–2.4)

## 2021-01-25 LAB — PREALBUMIN: Prealbumin: 9.5 mg/dL — ABNORMAL LOW (ref 18–38)

## 2021-01-25 LAB — PHOSPHORUS: Phosphorus: 3.2 mg/dL (ref 2.5–4.6)

## 2021-01-25 LAB — VITAMIN B12: Vitamin B-12: 457 pg/mL (ref 180–914)

## 2021-01-25 MED ORDER — CHLORHEXIDINE GLUCONATE CLOTH 2 % EX PADS
6.0000 | MEDICATED_PAD | Freq: Every day | CUTANEOUS | Status: DC
  Administered 2021-01-25 – 2021-02-02 (×9): 6 via TOPICAL

## 2021-01-25 MED ORDER — MESALAMINE ER 250 MG PO CPCR
250.0000 mg | ORAL_CAPSULE | Freq: Two times a day (BID) | ORAL | Status: DC
  Administered 2021-01-25 – 2021-02-02 (×18): 250 mg via ORAL
  Filled 2021-01-25 (×18): qty 1

## 2021-01-25 MED ORDER — DILTIAZEM HCL-DEXTROSE 125-5 MG/125ML-% IV SOLN (PREMIX)
5.0000 mg/h | INTRAVENOUS | Status: AC
  Administered 2021-01-25 (×2): 7.5 mg/h via INTRAVENOUS
  Filled 2021-01-25: qty 125

## 2021-01-25 MED ORDER — AMIODARONE LOAD VIA INFUSION
150.0000 mg | Freq: Once | INTRAVENOUS | Status: AC
  Administered 2021-01-25: 150 mg via INTRAVENOUS
  Filled 2021-01-25: qty 83.34

## 2021-01-25 MED ORDER — AMIODARONE HCL IN DEXTROSE 360-4.14 MG/200ML-% IV SOLN
30.0000 mg/h | INTRAVENOUS | Status: DC
  Administered 2021-01-25 – 2021-01-27 (×4): 30 mg/h via INTRAVENOUS
  Filled 2021-01-25 (×5): qty 200

## 2021-01-25 MED ORDER — SODIUM CHLORIDE 0.9 % IV SOLN: INTRAVENOUS | Status: DC

## 2021-01-25 MED ORDER — POTASSIUM CHLORIDE CRYS ER 20 MEQ PO TBCR
40.0000 meq | EXTENDED_RELEASE_TABLET | Freq: Once | ORAL | Status: AC
  Administered 2021-01-25: 40 meq via ORAL
  Filled 2021-01-25: qty 2

## 2021-01-25 MED ORDER — AMIODARONE HCL 200 MG PO TABS: 200.0000 mg | ORAL_TABLET | Freq: Every day | ORAL | Status: DC

## 2021-01-25 MED ORDER — AMIODARONE HCL IN DEXTROSE 360-4.14 MG/200ML-% IV SOLN
60.0000 mg/h | INTRAVENOUS | Status: DC
  Administered 2021-01-25 (×2): 60 mg/h via INTRAVENOUS
  Filled 2021-01-25: qty 200

## 2021-01-25 MED ORDER — AMIODARONE HCL 200 MG PO TABS
200.0000 mg | ORAL_TABLET | Freq: Two times a day (BID) | ORAL | Status: DC
  Administered 2021-01-26 – 2021-01-27 (×3): 200 mg via ORAL
  Filled 2021-01-25 (×3): qty 1

## 2021-01-25 MED ORDER — ORAL CARE MOUTH RINSE
15.0000 mL | Freq: Two times a day (BID) | OROMUCOSAL | Status: DC
  Administered 2021-01-25 – 2021-02-02 (×17): 15 mL via OROMUCOSAL

## 2021-01-25 NOTE — Consult Note (Signed)
Reason for Consult: Right elbow pain and swelling Referring Physician: Dr. [name] is an 80 y.o. female.  HPI: 80 year old female with medical history significant for atrial fibrillation and RVR with coronary artery disease and ulcerative colitis was admitted to the stepdown unit for shortness of breath.  She was complaining of right elbow pain and there was notable swelling.  X-rays did not reveal acute bone injury but did reveal a joint effusion.  Orthopedics was consulted.  Patient has mild pain in her right elbow.  She states there is a small wound that is draining a clear fluid.  She notes swelling.  She is using her arm to eat currently and denies significant pain with range of motion.  Denies numbness or tingling.  Past Medical History:  Diagnosis Date  . Arrhythmia   . Atypical atrial flutter ([place]) 08/14/2018   Post-operative  . Breast cancer of upper-outer quadrant of left female breast ([place]) 02/02/2016  . CAD (coronary artery disease) 06/20/2018   LHC 7/19: pLAD 65/90, oD1 90, mLCx 85, OM2 50, oRCA 30, EF 50-55 >> s/p CABG in 9/19 (L-LAD)  . Colon polyp 02/2012  . Dupuytren contracture    bilateral hands  . Family history of breast cancer   . Hyperlipidemia   . Hypertension   . Mitral regurgitation 11/07/2013   Echo 7/19: Mild LVH, EF 60-65, no RWMA, mod ot severe MR, massive LAE, PASP 33 // TEE 7/19:  Mild conc LVH, EF 60-65, no RWMA, severe MR with mild post leaflet prolapse, massive // s/p MV repair 07/2018  . On continuous oral anticoagulation 08/22/2015   Started on Xarelto 08/12/2015   . Osteopenia   . Persistent atrial fibrillation ([place]) 08/22/2015   Started late August or early September 2016 // s/p Maze procedure 07/2018  . Personal history of radiation therapy   . Radiation Therapy 04/21/16-05/19/16   left breast 47.72 Gy, boosted to 10 Gy  . 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 S/P Maze operation for atrial fibrillation 08/10/2018   Complete  bilateral atrial lesion set using bipolar radiofrequency and cryothermy ablation with clipping of LA appendage  . 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 [place]ative [place] ([place]   . Wears glasses     Past Surgical History:  Procedure Laterality Date  . BIOPSY  07/14/2020   Procedure: BIOPSY;  Surgeon: KRonnette Juniper MD;  [facility];  Service: Gastroenterology;;  . BREAST BIOPSY Left 01/28/2016  . BREAST LUMPECTOMY Left 02/23/2016  . BREAST LUMPECTOMY WITH RADIOACTIVE SEED LOCALIZATION Left 02/23/2016   Procedure: BREAST LUMPECTOMY WITH RADIOACTIVE SEED LOCALIZATION;  Surgeon: BExcell Seltzer MD;  [facility]  Service: General;  Laterality: Left;  . CARDIAC CATHETERIZATION    . CARDIOVERSION N/A 10/02/2015   Procedure: CARDIOVERSION;  Surgeon: MJerline Pain MD;  [facility];  Service: Cardiovascular;  Laterality: N/A;  . CARDIOVERSION N/A 10/05/2018   Procedure: CARDIOVERSION;  Surgeon: RFay Records MD;  [facility];  Service: Cardiovascular;  Laterality: N/A;  . CARDIOVERSION N/A 11/05/2019   Procedure: CARDIOVERSION;  Surgeon: NDorothy Spark MD;  [facility]  Service: Cardiovascular;  Laterality: N/A;  . CLIPPING OF ATRIAL APPENDAGE  08/10/2018   Procedure: CLIPPING OF ATRIAL APPENDAGE;  Surgeon: ORexene Alberts MD;  [facility]  Service: Open Heart Surgery;;  . COLONOSCOPY    . COLONOSCOPY WITH PROPOFOL N/A 07/14/2020   Procedure: COLONOSCOPY WITH PROPOFOL;  Surgeon: Ronnette Juniper, MD;  [facility];  Service: Gastroenterology;  Laterality: N/A;  . CORONARY ARTERY BYPASS GRAFT N/A 08/10/2018   Procedure: CORONARY ARTERY BYPASS GRAFTING (CABG) x 1, LIMA-LAD,  USING LEFT INTERNAL MAMMARY ARTERY. HARVESTED RIGHT GREATER SAPHENOUS VEIN ENDOSCOPICALLY;   Surgeon: Rexene Alberts, MD;  Location: [facility];  Service: Open Heart Surgery;  Laterality: N/A;  . CYSTOSCOPY W/ RETROGRADES Bilateral 06/20/2019   Procedure: CYSTOSCOPY WITH RETROGRADE PYELOGRAM;  Surgeon: Ardis Hughs, MD;  Location: [facility];  Service: Urology;  Laterality: Bilateral;  . CYSTOSCOPY WITH HYDRODISTENSION AND BIOPSY N/A 06/20/2019   Procedure: CYSTOSCOPY BLADDER  BIOPSY WITH FULGERATION;  Surgeon: Ardis Hughs, MD;  Location: [facility];  Service: Urology;  Laterality: N/A;  . DUPUYTREN CONTRACTURE RELEASE  2001   leftx2  . [place]   right  . DUPUYTREN CONTRACTURE RELEASE Right 05/02/2014   Procedure: EXCISION DUPUYTRENS RIGHT PALMAR/SMALL ;  Surgeon: Cammie Sickle, MD;  Location: [place];  Service: Orthopedics;  Laterality: Right;  . HEMOSTASIS CLIP PLACEMENT  07/14/2020   Procedure: HEMOSTASIS CLIP PLACEMENT;  Surgeon: Ronnette Juniper, MD;  [facility];  Service: Gastroenterology;;  . IR VERTEBROPLASTY CERV/THOR BX INC UNI/BIL INC/INJECT/IMAGING  08/19/2020  . IR VERTEBROPLASTY CERV/THOR BX INC UNI/BIL INC/INJECT/IMAGING  09/15/2020  . MAZE N/A 08/10/2018   Procedure: MAZE;  Surgeon: Rexene Alberts, MD;  Location: [place];  Service: Open Heart Surgery;  Laterality: N/A;  . MITRAL VALVE REPAIR N/A 08/10/2018   Procedure: MITRAL VALVE REPAIR (MVR);  Surgeon: Rexene Alberts, MD;  Location: [place];  Service: Open Heart Surgery;  Laterality: N/A;  glutaraldehyde  . POLYPECTOMY  07/14/2020   Procedure: POLYPECTOMY;  Surgeon: Ronnette Juniper, MD;  [facility];  Service: Gastroenterology;;  . RIGHT/LEFT HEART CATH AND CORONARY ANGIOGRAPHY N/A 06/20/2018   Procedure: RIGHT/LEFT HEART CATH AND CORONARY ANGIOGRAPHY;  Surgeon: [name], MD;  Location: [place] [facility];  Service: Cardiovascular;  Laterality: N/A;  . TEE WITHOUT CARDIOVERSION N/A 06/20/2018   Procedure:  TRANSESOPHAGEAL ECHOCARDIOGRAM (TEE);  Surgeon: Pixie Casino, MD;  [facility];  Service: Cardiovascular;  Laterality: N/A;  . TEE WITHOUT CARDIOVERSION N/A 08/10/2018   Procedure: TRANSESOPHAGEAL ECHOCARDIOGRAM (TEE);  Surgeon: Rexene Alberts, MD;  Location: [place];  Service: Open Heart Surgery;  Laterality: N/A;  . TONSILLECTOMY  age 67    Family History  Problem Relation Age of Onset  . Cirrhosis Mother   . Breast cancer Mother 15  . Heart failure Father   . Heart attack Father   . Breast cancer Sister        early 24's  . Kidney Stones Sister        loss of kidney due to stones  . Osteoporosis Neg Hx     Social History:  reports that she quit smoking about 28 years ago. Her smoking use included cigarettes. She has a 20.00 pack-year smoking history. She has never used smokeless tobacco. She reports current alcohol use of about 3.0 standard drinks of alcohol per week. She reports that she does not use drugs.  Allergies:  Allergies  Allergen Reactions  . Shellfish Allergy Itching  . Amoxicillin-Pot Clavulanate Diarrhea    Medications: I have reviewed the patient's current medications.  Results for  orders placed or performed during the hospital encounter of 01/24/21 (from the past 48 hour(s))  Cortisol, Random     Status: None   Collection Time: 01/24/21  2:43 PM  Result Value Ref Range   Cortisol, Plasma 11.7 ug/dL    Comment: (NOTE) AM    6.7 - 22.6 ug/dL PM   <10.0       ug/dL Performed at [facility] [address] [place], [place] [address]   Lactic acid, plasma     Status: Abnormal   Collection Time: 01/24/21  3:07 PM  Result Value Ref Range   Lactic Acid, Venous 2.1 (HH) 0.5 - 1.9 mmol/L    Comment: CRITICAL RESULT CALLED TO, READ BACK BY AND VERIFIED WITH: RIVERS, TJ AT 1608 ON 01/24/2021 BY JPM Performed at [facility], [place] [address] [place], [place] [address]   Urinalysis, Routine w reflex microscopic Urine, Clean  Catch     Status: Abnormal   Collection Time: 01/24/21  3:07 PM  Result Value Ref Range   Color, Urine YELLOW YELLOW   APPearance HAZY (A) CLEAR   Specific Gravity, Urine 1.015 1.005 - 1.030   pH 5.0 5.0 - 8.0   Glucose, UA NEGATIVE NEGATIVE mg/dL   Hgb urine dipstick NEGATIVE NEGATIVE   Bilirubin Urine NEGATIVE NEGATIVE   Ketones, ur NEGATIVE NEGATIVE mg/dL   Protein, ur NEGATIVE NEGATIVE mg/dL   Nitrite NEGATIVE NEGATIVE   Leukocytes,Ua TRACE (A) NEGATIVE   RBC / HPF 0-5 0 - 5 RBC/hpf   WBC, UA 6-10 0 - 5 WBC/hpf   Bacteria, UA RARE (A) NONE SEEN   Squamous Epithelial / LPF 11-20 0 - 5   Mucus PRESENT    Hyaline Casts, UA PRESENT     Comment: Performed at [facility], [place] [address] [place], [place] [address]  Blood culture (routine x 2)     Status: None (Preliminary result)   Collection Time: 01/24/21  3:13 PM   Specimen: BLOOD  Result Value Ref Range   Specimen Description      BLOOD BLOOD LEFT ARM Performed at [facility] [address], [place], [place] [address]    Special Requests      BOTTLES DRAWN AEROBIC AND ANAEROBIC Blood Culture adequate volume Performed at [facility] [address], [place], [place] [address]    Culture      NO GROWTH < 12 HOURS Performed at [facility] [address], [place], [place] [address]    Report Status PENDING   Comprehensive metabolic panel     Status: Abnormal   Collection Time: 01/24/21  3:22 PM  Result Value Ref Range   Sodium 139 135 - 145 mmol/L   Potassium 4.4 3.5 - 5.1 mmol/L    Comment: MODERATE HEMOLYSIS   Chloride 106 98 - 111 mmol/L   CO2 24 22 - 32 mmol/L   Glucose, Bld 117 (H) 70 - 99 mg/dL    Comment: Glucose reference range applies only to samples taken after fasting for at least 8 hours.   BUN 16 8 - 23 mg/dL   Creatinine, Ser 0.78 0.44 - 1.00 mg/dL   Calcium 8.1 (L) 8.9 - 10.3 mg/dL   Total Protein 5.2 (L) 6.5 - 8.1 g/dL   Albumin 2.2  (L) 3.5 - 5.0 g/dL   AST 80 (H) 15 - 41 U/L   ALT 39 0 - 44 U/L   Alkaline Phosphatase 62  38 - 126 U/L   Total Bilirubin 1.4 (H) 0.3 - 1.2 mg/dL   GFR, Estimated >60 >60 mL/min    Comment: (NOTE) Calculated using the CKD-EPI Creatinine Equation (2021)    Anion gap 9 5 - 15    Comment: Performed at [facility], [place] [address] [place], [place] [address]  Troponin I (High Sensitivity)     Status: Abnormal   Collection Time: 01/24/21  3:22 PM  Result Value Ref Range   Troponin I (High Sensitivity) 25 (H) <18 ng/L    Comment: (NOTE) Elevated high sensitivity troponin I (hsTnI) values and significant  changes across serial measurements may suggest ACS but many other  chronic and acute conditions are known to elevate hsTnI results.  Refer to the Links section for chest pain algorithms and additional  guidance. Performed at [facility], [place] [address] [place], [place] [address]   Blood culture (routine x 2)     Status: None (Preliminary result)   Collection Time: 01/24/21  3:22 PM   Specimen: BLOOD  Result Value Ref Range   Specimen Description      BLOOD LEFT ARM Performed at [facility] [address], [place], [place] [address]    Special Requests      BOTTLES DRAWN AEROBIC AND ANAEROBIC Blood Culture adequate volume Performed at [facility] [address] [place], [place] [address]    Culture      NO GROWTH < 12 HOURS Performed at [facility] [address], [place], [place] [address]    Report Status PENDING   CBC with Differential/Platelet     Status: Abnormal   Collection Time: 01/24/21  4:10 PM  Result Value Ref Range   WBC 13.8 (H) 4.0 - 10.5 K/uL   RBC 2.60 (L) 3.87 - 5.11 MIL/uL   Hemoglobin 8.0 (L) 12.0 - 15.0 g/dL   HCT 25.8 (L) 36.0 - 46.0 %   MCV 99.2 80.0 - 100.0 fL   MCH 30.8 26.0 - 34.0 pg   MCHC 31.0 30.0 - 36.0 g/dL   RDW 15.9 (H) 11.5 - 15.5 %   Platelets 123  (L) 150 - 400 K/uL   nRBC 0.1 0.0 - 0.2 %   Neutrophils Relative % 94 %   Neutro Abs 13.0 (H) 1.7 - 7.7 K/uL   Lymphocytes Relative 3 %   Lymphs Abs 0.4 (L) 0.7 - 4.0 K/uL   Monocytes Relative 2 %   Monocytes Absolute 0.3 0.1 - 1.0 K/uL   Eosinophils Relative 0 %   Eosinophils Absolute 0.0 0.0 - 0.5 K/uL   Basophils Relative 0 %   Basophils Absolute 0.0 0.0 - 0.1 K/uL   Immature Granulocytes 1 %   Abs Immature Granulocytes 0.11 (H) 0.00 - 0.07 K/uL    Comment: Performed at [facility], [place] [address], [place],  [address]  Brain natriuretic peptide     Status: Abnormal   Collection Time: 01/24/21  4:10 PM  Result Value Ref Range   B Natriuretic Peptide 478.7 (H) 0.0 - 100.0 pg/mL    Comment: Performed at [facility], [place] [address] [place], [place] [address]  Lactic acid, plasma     Status: None   Collection Time: 01/24/21  5:07 PM  Result Value Ref Range   Lactic Acid, Venous 1.5 0.5 - 1.9 mmol/L    Comment: Performed at [facility], [place] [address] [place],  [place] [address]  Troponin I (High Sensitivity)     Status: Abnormal   Collection Time: 01/24/21  5:07 PM  Result Value Ref Range   Troponin I (High Sensitivity) 24 (H) <18 ng/L    Comment: (NOTE) Elevated high sensitivity troponin I (hsTnI) values and significant  changes across serial measurements may suggest ACS but many other  chronic and acute conditions are known to elevate hsTnI results.  Refer to the "Links" section for chest pain algorithms and additional  guidance. Performed at [facility], [place] [address], [place], [place] [address]   Resp Panel by RT-PCR (Flu A&B, Covid) Nasopharyngeal Swab     Status: None   Collection Time: 01/24/21  5:27 PM   Specimen: Nasopharyngeal Swab; Nasopharyngeal(NP) swabs in vial transport medium  Result Value Ref Range   SARS Coronavirus 2 by RT PCR NEGATIVE NEGATIVE    Comment:  (NOTE) SARS-CoV-2 target nucleic acids are NOT DETECTED.  The SARS-CoV-2 RNA is generally detectable in upper respiratory specimens during the acute phase of infection. The lowest concentration of SARS-CoV-2 viral copies this assay can detect is 138 copies/mL. A negative result does not preclude SARS-Cov-2 infection and should not be used as the sole basis for treatment or other patient management decisions. A negative result may occur with  improper specimen collection/handling, submission of specimen other than nasopharyngeal swab, presence of viral mutation(s) within the areas targeted by this assay, and inadequate number of viral copies(<138 copies/mL). A negative result must be combined with clinical observations, patient history, and epidemiological information. The expected result is Negative.  Fact Sheet for Patients:  EntrepreneurPulse.com.au  Fact Sheet for Healthcare Providers:  IncredibleEmployment.be  This test is no t yet approved or cleared by the [place] FDA and  has been authorized for detection and/or diagnosis of SARS-CoV-2 by FDA under an Emergency Use Authorization (EUA). This EUA will remain  in effect (meaning this test can be used) for the duration of the COVID-19 declaration under Section 564(b)(1) of the Act, 21 U.S.C.section 360bbb-3(b)(1), unless the authorization is terminated  or revoked sooner.       Influenza A by PCR NEGATIVE NEGATIVE   Influenza B by PCR NEGATIVE NEGATIVE    Comment: (NOTE) The Xpert Xpress SARS-CoV-2/FLU/RSV plus assay is intended as an aid in the diagnosis of influenza from Nasopharyngeal swab specimens and should not be used as a sole basis for treatment. Nasal washings and aspirates are unacceptable for Xpert Xpress SARS-CoV-2/FLU/RSV testing.  Fact Sheet for Patients: EntrepreneurPulse.com.au  Fact Sheet for Healthcare  Providers: IncredibleEmployment.be  This test is not yet approved or cleared by the [place] FDA and has been authorized for detection and/or diagnosis of SARS-CoV-2 by FDA under an Emergency Use Authorization (EUA). This EUA will remain in effect (meaning this test can be used) for the duration of the COVID-19 declaration under Section 564(b)(1) of the Act, 21 U.S.C. section 360bbb-3(b)(1), unless the authorization is terminated or revoked.  Performed at [facility], [place] [address], [place],  [address]   Procalcitonin     Status: None   Collection Time: 01/24/21  6:57 PM  Result Value Ref Range   Procalcitonin 14.95 ng/mL    Comment:        Interpretation: PCT >= 10 ng/mL: Important systemic inflammatory response, almost exclusively due to severe bacterial sepsis or septic shock. (NOTE)       Sepsis PCT Algorithm           Lower  Respiratory Tract                                      Infection PCT Algorithm    ----------------------------     ----------------------------         PCT < 0.25 ng/mL                PCT < 0.10 ng/mL          Strongly encourage             Strongly discourage   discontinuation of antibiotics    initiation of antibiotics    ----------------------------     -----------------------------       PCT 0.25 - 0.50 ng/mL            PCT 0.10 - 0.25 ng/mL               OR       >80% decrease in PCT            Discourage initiation of                                            antibiotics      Encourage discontinuation           of antibiotics    ----------------------------     -----------------------------         PCT >= 0.50 ng/mL              PCT 0.26 - 0.50 ng/mL                AND       <80% decrease in PCT             Encourage initiation of                                             antibiotics       Encourage continuation           of antibiotics    ----------------------------      -----------------------------        PCT >= 0.50 ng/mL                  PCT > 0.50 ng/mL               AND         increase in PCT                  Strongly encourage                                      initiation of antibiotics    Strongly encourage escalation           of antibiotics                                     -----------------------------  PCT <= 0.25 ng/mL                                                 OR                                        > 80% decrease in PCT                                      Discontinue / Do not initiate                                             antibiotics  Performed at [facility] [address], [place], [place] [address]   Type and screen [place]     Status: None   Collection Time: 01/24/21  6:57 PM  Result Value Ref Range   ABO/RH(D) O POS    Antibody Screen NEG    Sample Expiration      01/27/2021,2359 Performed at [facility], [place] [address], [place], [place] [address]   Phosphorus     Status: None   Collection Time: 01/24/21  6:57 PM  Result Value Ref Range   Phosphorus 3.2 2.5 - 4.6 mg/dL    Comment: Performed at [facility], [place] [address], [place], [place] [address]  Reticulocytes     Status: Abnormal   Collection Time: 01/24/21  7:34 PM  Result Value Ref Range   Retic Ct Pct 3.0 0.4 - 3.1 %   RBC. 2.47 (L) 3.87 - 5.11 MIL/uL   Retic Count, Absolute 72.9 19.0 - 186.0 K/uL   Immature Retic Fract 29.7 (H) 2.3 - 15.9 %    Comment: Performed at [facility], [place] [address] [place], [place] [address]  Magnesium     Status: None   Collection Time: 01/24/21  7:34 PM  Result Value Ref Range   Magnesium 1.7 1.7 - 2.4 mg/dL    Comment: Performed at [facility], [place] [address], [place], [place] [address]  CK     Status: Abnormal   Collection Time: 01/24/21  7:34 PM   Result Value Ref Range   Total CK 10 (L) 38 - 234 U/L    Comment: Performed at [facility], [place] [address] [place], [place] [address]  Protime-INR     Status: Abnormal   Collection Time: 01/24/21  7:34 PM  Result Value Ref Range   Prothrombin Time 17.4 (H) 11.4 - 15.2 seconds   INR 1.5 (H) 0.8 - 1.2    Comment: (NOTE) INR goal varies based on device and disease states. Performed at [facility], [place] [address], [place], [place] [address]   Uric acid     Status: None   Collection Time: 01/25/21  5:27 AM  Result Value Ref Range   Uric Acid, Serum 3.7 2.5 - 7.1 mg/dL    Comment: Performed at [facility], [place] [address], [place], [place] [address]  Vitamin B12  Status: None   Collection Time: 01/25/21  5:34 AM  Result Value Ref Range   Vitamin B-12 457 180 - 914 pg/mL    Comment: (NOTE) This assay is not validated for testing neonatal or myeloproliferative syndrome specimens for Vitamin B12 levels. Performed at [facility], [place] [address] [place], [place] [address]   Folate     Status: None   Collection Time: 01/25/21  5:34 AM  Result Value Ref Range   Folate 9.5 >5.9 ng/mL    Comment: Performed at [facility], [place] [address] [place], [place] [address]  Iron and TIBC     Status: Abnormal   Collection Time: 01/25/21  5:34 AM  Result Value Ref Range   Iron 54 28 - 170 ug/dL   TIBC 104 (L) 250 - 450 ug/dL   Saturation Ratios 52 (H) 10.4 - 31.8 %   UIBC 50 ug/dL    Comment: Performed at [facility], [place] [address] [place], [place] [address]  Ferritin     Status: None   Collection Time: 01/25/21  5:34 AM  Result Value Ref Range   Ferritin 146 11 - 307 ng/mL    Comment: Performed at [facility], [place] [address] [place], [place] [address]  Prealbumin     Status: Abnormal   Collection Time: 01/25/21  5:34 AM  Result Value Ref Range    Prealbumin 9.5 (L) 18 - 38 mg/dL    Comment: Performed at [facility], [place] [address], [place], [place] [address]  Magnesium     Status: None   Collection Time: 01/25/21  5:34 AM  Result Value Ref Range   Magnesium 1.8 1.7 - 2.4 mg/dL    Comment: Performed at [facility], [place] [address], [place], [place] [address]  Phosphorus     Status: None   Collection Time: 01/25/21  5:34 AM  Result Value Ref Range   Phosphorus 3.2 2.5 - 4.6 mg/dL    Comment: Performed at [facility], [place] [address] [place], [place] [address]  CBC WITH DIFFERENTIAL     Status: Abnormal   Collection Time: 01/25/21  5:34 AM  Result Value Ref Range   WBC 8.6 4.0 - 10.5 K/uL   RBC 2.44 (L) 3.87 - 5.11 MIL/uL   Hemoglobin 7.3 (L) 12.0 - 15.0 g/dL   HCT 24.4 (L) 36.0 - 46.0 %   MCV 100.0 80.0 - 100.0 fL   MCH 29.9 26.0 - 34.0 pg   MCHC 29.9 (L) 30.0 - 36.0 g/dL   RDW 15.7 (H) 11.5 - 15.5 %   Platelets 107 (L) 150 - 400 K/uL    Comment: SPECIMEN CHECKED FOR CLOTS Immature Platelet Fraction may be clinically indicated, consider ordering this additional test [id number] REPEATED TO VERIFY PLATELET COUNT CONFIRMED BY SMEAR    nRBC 0.0 0.0 - 0.2 %   Neutrophils Relative % 91 %   Neutro Abs 7.9 (H) 1.7 - 7.7 K/uL   Lymphocytes Relative 5 %   Lymphs Abs 0.4 (L) 0.7 - 4.0 K/uL   Monocytes Relative 3 %   Monocytes Absolute 0.2 0.1 - 1.0 K/uL   Eosinophils Relative 0 %   Eosinophils Absolute 0.0 0.0 - 0.5 K/uL   Basophils Relative 0 %   Basophils Absolute 0.0 0.0 - 0.1 K/uL   Immature Granulocytes 1 %   Abs Immature Granulocytes 0.10 (H) 0.00 - 0.07 K/uL   Tear Drop Cells  PRESENT    Ovalocytes PRESENT     Comment: Performed at [facility], [place] [address] [place], [place] [address]  TSH     Status: None   Collection Time: 01/25/21  5:34 AM  Result Value Ref Range   TSH 0.690 0.350 - 4.500 uIU/mL    Comment: Performed by a 3rd  Generation assay with a functional sensitivity of <=0.01 uIU/mL. Performed at [facility], [place] [address] [place], [place] [address]   Comprehensive metabolic panel     Status: Abnormal   Collection Time: 01/25/21  5:34 AM  Result Value Ref Range   Sodium 140 135 - 145 mmol/L   Potassium 3.4 (L) 3.5 - 5.1 mmol/L    Comment: DELTA CHECK NOTED   Chloride 108 98 - 111 mmol/L   CO2 26 22 - 32 mmol/L   Glucose, Bld 118 (H) 70 - 99 mg/dL    Comment: Glucose reference range applies only to samples taken after fasting for at least 8 hours.   BUN 12 8 - 23 mg/dL   Creatinine, Ser 0.56 0.44 - 1.00 mg/dL   Calcium 7.8 (L) 8.9 - 10.3 mg/dL   Total Protein 4.8 (L) 6.5 - 8.1 g/dL   Albumin 2.3 (L) 3.5 - 5.0 g/dL   AST 32 15 - 41 U/L   ALT 30 0 - 44 U/L   Alkaline Phosphatase 45 38 - 126 U/L   Total Bilirubin 0.9 0.3 - 1.2 mg/dL   GFR, Estimated >60 >60 mL/min    Comment: (NOTE) Calculated using the CKD-EPI Creatinine Equation (2021)    Anion gap 6 5 - 15    Comment: Performed at [facility], [place] [address], [place], [place] [address]    DG Elbow 2 Views Right  Result Date: 01/24/2021 CLINICAL DATA:  Provided history of: Pain on the olcer Patient reports pain about the ulnar aspect of right elbow for 1 week. EXAM: RIGHT ELBOW - 2 VIEW COMPARISON:  None. FINDINGS: Frontal and lateral views. Normal alignment and joint spaces. No fracture, erosion, or bone destruction. There is a moderate elbow joint effusion. Soft tissue edema is most prominent about the posterior and ulnar aspect of the elbow. IMPRESSION: Moderate elbow joint effusion, nonspecific. Generalized soft tissue edema most prominent posterior medially. Electronically Signed   By: Keith Rake M.D.   On: 01/24/2021 17:26   DG Chest Port 1 View  Result Date: 01/24/2021 CLINICAL DATA:  Looking for infectious source. Recent UTI last week. Continued dysuria. Swelling and weeping of right upper arm.  EXAM: PORTABLE CHEST 1 VIEW COMPARISON:  Chest x-ray 01/16/2021, CT chest 01/16/2021 FINDINGS: Left appendage clip, mitral annular valve replacement, as well as sternotomy wires are again noted. The heart size and mediastinal contours are within normal limits. Aortic arch calcifications. Biapical pleural/pulmonary scarring. No focal consolidation. Similar appearing coarsened interstitial markings. Persistent bilateral trace to small volume pleural effusions with streaky and patchy airspace opacities. No pneumothorax. No acute osseous abnormality. Mid to lower thoracic body kyphoplasty again noted. IMPRESSION: Persistent bilateral trace to small volume pleural effusions with underlying infection/inflammation not excluded. Electronically Signed   By: Iven Finn M.D.   On: 01/24/2021 15:55    Review of Systems  Constitutional: Negative.   HENT: Negative.   Respiratory: Negative.   Cardiovascular: Negative.   Gastrointestinal: Negative.   Genitourinary: Negative.   Musculoskeletal:       Right elbow pain  Skin:  Swelling  Neurological: Negative.   Psychiatric/Behavioral: Negative.    Blood pressure 115/74, pulse (!) 120, temperature 97.6 F (36.4 C), temperature source Oral, resp. rate 15, height 5' 4"  (1.626 m), weight 58.8 kg, SpO2 100 %. Physical Exam HENT:     Head: Normocephalic.     Mouth/Throat:     Mouth: Mucous membranes are moist.  Eyes:     Extraocular Movements: Extraocular movements intact.  Cardiovascular:     Rate and Rhythm: Tachycardia present.     Pulses: Normal pulses.  Pulmonary:     Effort: Pulmonary effort is normal.  Abdominal:     General: Abdomen is flat.  Musculoskeletal:     Comments: Right elbow demonstrates some swelling posteriorly about the soft tissue.  This is just distal to her PICC line that was placed on the medial arm proximally.  She has mild tenderness palpation along this area.  No notable fluctuance.  Patient is using the right arm to  eat.  She has active range of motion from about 10 degrees shy of full extension to flexion to past 90 degrees without significant pain.  There are some superficial abrasions about the arm and forearm.  She tolerates pronation and supination.  Good strength with testing of the triceps and biceps musculature.  Distally she has intact sensation with palpable radial pulse.  Skin:    Findings: Bruising and lesion present.  Neurological:     General: No focal deficit present.     Mental Status: She is alert.  Psychiatric:        Mood and Affect: Mood normal.     Assessment/Plan: Right elbow demonstrates some soft tissue swelling posteriorly that is more consistent with infiltration then infection.  She is using her elbow for eating and tolerates range of motion through her arc of range of motion with pronation and supination intact.  She has good strength.  At this point I am not concerned for septic elbow.  I would recommend pressure dressing in this area with Ace wrap due to the swelling and for further inspection of the PICC line to ensure she is not getting continual infiltration of this area.  There is no concern for compartment syndrome.  No need for orthopedic follow-up.  Please reconsult if symptoms do not improve or worsen.  Michele Green 01/25/2021, 2:28 PM

## 2021-01-25 NOTE — Progress Notes (Signed)
  Amiodarone Drug - Drug Interaction Consult Note  Amiodarone is metabolized by the cytochrome P450 system and therefore has the potential to cause many drug interactions. Amiodarone has an average plasma half-life of 50 days (range 20 to 100 days).   There is potential for drug interactions to occur several weeks or months after stopping treatment and the onset of drug interactions may be slow after initiating amiodarone.   [x]  Statins: Increased risk of myopathy. Simvastatin- restrict dose to 39m daily. Other statins: counsel patients to report any muscle pain or weakness immediately.  []  Anticoagulants: Amiodarone can increase anticoagulant effect. Consider warfarin dose reduction. Patients should be monitored closely and the dose of anticoagulant altered accordingly, remembering that amiodarone levels take several weeks to stabilize.  []  Antiepileptics: Amiodarone can increase plasma concentration of phenytoin, the dose should be reduced. Note that small changes in phenytoin dose can result in large changes in levels. Monitor patient and counsel on signs of toxicity.  []  Beta blockers: increased risk of bradycardia, AV block and myocardial depression. Sotalol - avoid concomitant use.  [x]   Calcium channel blockers (diltiazem and verapamil): increased risk of bradycardia, AV block and myocardial depression.   []   Cyclosporine: Amiodarone increases levels of cyclosporine. Reduced dose of cyclosporine is recommended.  []  Digoxin dose should be halved when amiodarone is started.  []  Diuretics: increased risk of cardiotoxicity if hypokalemia occurs.  []  Oral hypoglycemic agents (glyburide, glipizide, glimepiride): increased risk of hypoglycemia. Patient's glucose levels should be monitored closely when initiating amiodarone therapy.   []  Drugs that prolong the QT interval:  Torsades de pointes risk may be increased with concurrent use - avoid if possible.  Monitor QTc, also keep  magnesium/potassium WNL if concurrent therapy can't be avoided. .Marland KitchenAntibiotics: e.g. fluoroquinolones, erythromycin. . Antiarrhythmics: e.g. quinidine, procainamide, disopyramide, sotalol. . Antipsychotics: e.g. phenothiazines, haloperidol.  . Lithium, tricyclic antidepressants, and methadone.  Thank You,  CGretta ArabPharmD, BCPS Clinical Pharmacist [facility] main pharmacy [phone]/04/2021 11:16 AM

## 2021-01-25 NOTE — Progress Notes (Signed)
OT Cancellation Note  Patient Details Name: Michele Green MRN: [id number] DOB: [birth year]   Cancelled Treatment:    Reason Eval/Treat Not Completed: Medical issues which prohibited therapy. Hold per RN, patient with elevated HR. Will re-attempt 3/7 as schedule permits.  Delbert Phenix OT OT pager: [place] 01/25/2021, 8:52 AM

## 2021-01-25 NOTE — Consult Note (Signed)
Cardiology Consultation:   Patient ID: [name] MRN: [id number]; DOB: 1941-04-11  Admit date: 01/24/2021 Date of Consult: 01/25/2021  PCP:  Michele Neer, MD   [place]  Cardiologist:  Michele Grooms, MD  Advanced Practice Provider:  No care team member to display Electrophysiologist:  Michele Meredith Leeds, MD    Patient Profile:   Michele Green is a 80 y.o. female with a hx of CAD s/p CABG, severe MR s/p repair, atrial fibrillation s/p MAZE without LAA ligation with recurrent afib,  who is being seen today for the evaluation of atrial fibrillation with RVR at the request of Michele Green.  History of Present Illness:   [name] has an extensive history of attempted therapy for her atrial fibrillation. Recent summary includes: -attempt at amiodarone and cardioversion 09/2018, recurrent fib/flutter, planned for rate control -discontinuation of diltiazem due to hypotension and LE edema in 07/2020 -started on digoxin during hospitalization 07/2020 -follow up appt 08/29/20 with excellent rate control on digoxin 0.125 mg daily (changed to 5 days/week dosing) and metoprolol tartrate 100 mg BID -admitted 01/17/21 for shortness of breath and lightheadedness. Rapid rate in the 110s. Cardiology not consulted that admission, but telemetry noted as sinus tachycardia. Diltiazem and digoxin held initially, restarted on lower dose metoprolol and diltiazem, digoxin held. Hypotension felt to be adrenal insufficiency. She was discharged on 01/21/21 on diltiazem 180 mg daily and metoprolol tartrate 75 mg BID. Of note, digoxin level on arrival was 2.3 2/26, but there are no comments regarding clinical symptoms of toxicity.  -ECG from 01/16/21 read as sinus with second degree AV block, type II. On my review, PR is prolonged prior to dropped beat and restarts with shorter PR, consistent with second degree AV block, type I (Wenkebach) -ECG from 01/17/21 read as atrial  fibrillation, but there are clear P waves. Again consistent with Wenkebach. -ECG from 01/24/21 is atrial flutter at 146 bpm, 2:1 block  At her prior hospitalization in 9-08/2020, noted that amiodarone could be use for rate control even if rhythm control not pursued. She has been continued on anticoagulation.  Currently, her heart rates are elevated. Of note, her Hgb is also 7.3, which may be driving some of the tachycardia. Cr 0.56.  She denies any feelings of chest pain or shortness of breath. Overall she just feels very fatigued. She does not recall issues with either amiodarone or digoxin in the past. She is open to trialing any medications that may make her feel better.  Denies chest pain, shortness of breath at rest or with normal exertion. No PND, orthopnea, LE edema or unexpected weight gain. No syncope or palpitations.  Past Medical History:  Diagnosis Date  . Arrhythmia   . Atypical atrial flutter (Michele Green) 08/14/2018   Post-operative  . Breast cancer of upper-outer quadrant of left female breast (Michele Green) 02/02/2016  . CAD (coronary artery disease) 06/20/2018   LHC 7/19: pLAD 65/90, oD1 90, mLCx 85, OM2 50, oRCA 30, EF 50-55 >> s/p CABG in 9/19 (L-LAD)  . Colon polyp 02/2012  . Dupuytren contracture    bilateral hands  . Family history of breast cancer   . Hyperlipidemia   . Hypertension   . Mitral regurgitation 11/07/2013   Echo 7/19: Mild LVH, EF 60-65, no RWMA, mod ot severe MR, massive LAE, PASP 33 // TEE 7/19:  Mild conc LVH, EF 60-65, no RWMA, severe MR with mild post leaflet prolapse, massive // s/p MV repair 07/2018  .  On continuous oral anticoagulation 08/22/2015   Started on Xarelto 08/12/2015   . Osteopenia   . Persistent atrial fibrillation ([place]) 08/22/2015   Started late August or early September 2016 // s/p Maze procedure 07/2018  . Personal history of radiation therapy   . Radiation Therapy 04/21/16-05/19/16   left breast 47.72 Gy, boosted to 10 Gy  . 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    LIMA to LAD  . S/P Maze operation for atrial fibrillation 08/10/2018   Complete bilateral atrial lesion set using bipolar radiofrequency and cryothermy ablation with clipping of LA appendage  . S/P MVR (mitral valve repair) 08/10/2018   Complex valvuloplasty including Gore-tex neochord placement x8, Plication of Lateral Commissure and 2m Sorin Memo 4D Ring Annuloplasty SN# [id number] . Ulcerative colitis ([place]   . Wears glasses     Past Surgical History:  Procedure Laterality Date  . BIOPSY  07/14/2020   Procedure: BIOPSY;  Surgeon: KRonnette Juniper MD;  Location: [facility];  Service: Gastroenterology;;  . BREAST BIOPSY Left 01/28/2016  . BREAST LUMPECTOMY Left 02/23/2016  . BREAST LUMPECTOMY WITH RADIOACTIVE SEED LOCALIZATION Left 02/23/2016   Procedure: BREAST LUMPECTOMY WITH RADIOACTIVE SEED LOCALIZATION;  Surgeon: BExcell Seltzer MD;  [place] [facility]  Service: General;  Laterality: Left;  . CARDIAC CATHETERIZATION    . CARDIOVERSION N/A 10/02/2015   Procedure: CARDIOVERSION;  Surgeon: MJerline Pain MD;  [facility];  Service: Cardiovascular;  Laterality: N/A;  . CARDIOVERSION N/A 10/05/2018   Procedure: CARDIOVERSION;  Surgeon: RFay Records MD;  [facility]  Service: Cardiovascular;  Laterality: N/A;  . CARDIOVERSION N/A 11/05/2019   Procedure: CARDIOVERSION;  Surgeon: NDorothy Spark MD;  [place]  Service: Cardiovascular;  Laterality: N/A;  . CLIPPING OF ATRIAL APPENDAGE  08/10/2018   Procedure: CLIPPING OF ATRIAL APPENDAGE;  Surgeon: ORexene Alberts MD;  Location: [facility]  Service: Open Heart Surgery;;  . COLONOSCOPY    . COLONOSCOPY WITH PROPOFOL N/A 07/14/2020   Procedure: COLONOSCOPY WITH PROPOFOL;  Surgeon: KRonnette Juniper MD;  [facility];  Service: Gastroenterology;  Laterality: N/A;  . CORONARY ARTERY BYPASS GRAFT N/A 08/10/2018   Procedure: CORONARY ARTERY BYPASS GRAFTING (CABG) x 1, LIMA-LAD,  USING  LEFT INTERNAL MAMMARY ARTERY. HARVESTED RIGHT GREATER SAPHENOUS VEIN ENDOSCOPICALLY;  Surgeon: ORexene Alberts MD;  [place] [facility]  Service: Open Heart Surgery;  Laterality: N/A;  . CYSTOSCOPY W/ RETROGRADES Bilateral 06/20/2019   Procedure: CYSTOSCOPY WITH RETROGRADE PYELOGRAM;  Surgeon: HArdis Hughs MD;  [facility]  Service: Urology;  Laterality: Bilateral;  . CYSTOSCOPY WITH HYDRODISTENSION AND BIOPSY N/A 06/20/2019   Procedure: CYSTOSCOPY BLADDER  BIOPSY WITH FULGERATION;  Surgeon: HArdis Hughs MD;  [facility]  Service: Urology;  Laterality: N/A;  . DUPUYTREN CONTRACTURE RELEASE  2001   [place]x2  . [place]  . DUPUYTREN CONTRACTURE RELEASE Right 05/02/2014   Procedure: EXCISION DUPUYTRENS RIGHT PALMAR/SMALL ;  Surgeon: RCammie Sickle MD;  [place]  Service: Orthopedics;  Laterality: Right;  . HEMOSTASIS CLIP PLACEMENT  07/14/2020   Procedure: HEMOSTASIS CLIP PLACEMENT;  Surgeon: KRonnette Juniper MD;  [facility];  Service: Gastroenterology;;  . IR VERTEBROPLASTY CERV/THOR BX INC UNI/BIL INC/INJECT/IMAGING  08/19/2020  . IR VERTEBROPLASTY CERV/THOR BX INC UNI/BIL INC/INJECT/IMAGING  09/15/2020  . MAZE N/A 08/10/2018   Procedure: MAZE;  Surgeon: ORexene Alberts MD;  Location:  [place] [facility];  Service: Open Heart Surgery;  Laterality: N/A;  . MITRAL VALVE REPAIR N/A 08/10/2018   Procedure: MITRAL VALVE REPAIR (MVR);  Surgeon: Rexene Alberts, MD;  Location: [place];  Service: Open Heart Surgery;  Laterality: N/A;  glutaraldehyde  . POLYPECTOMY  07/14/2020   Procedure: POLYPECTOMY;  Surgeon: Ronnette Juniper, MD;  [facility];  Service: Gastroenterology;;  . RIGHT/LEFT HEART CATH AND CORONARY ANGIOGRAPHY N/A 06/20/2018   Procedure: RIGHT/LEFT HEART CATH AND CORONARY ANGIOGRAPHY;  Surgeon: [name], MD;  Location: [place] [facility];  Service:  Cardiovascular;  Laterality: N/A;  . TEE WITHOUT CARDIOVERSION N/A 06/20/2018   Procedure: TRANSESOPHAGEAL ECHOCARDIOGRAM (TEE);  Surgeon: Pixie Casino, MD;  [facility];  Service: Cardiovascular;  Laterality: N/A;  . TEE WITHOUT CARDIOVERSION N/A 08/10/2018   Procedure: TRANSESOPHAGEAL ECHOCARDIOGRAM (TEE);  Surgeon: Rexene Alberts, MD;  Location: [place];  Service: Open Heart Surgery;  Laterality: N/A;  . TONSILLECTOMY  age 76     Home Medications:  Prior to Admission medications   Medication Sig Start Date End Date Taking? Authorizing Provider  acetaminophen (TYLENOL) 500 MG tablet Take 500-1,000 mg by mouth every 6 (six) hours as needed for moderate pain or headache.    Yes [provider]  allopurinol (ZYLOPRIM) 100 MG tablet Take 200 mg by mouth every evening.  06/21/13  Yes [provider]  atorvastatin (LIPITOR) 20 MG tablet TAKE 1 TABLET BY MOUTH EVERY DAY Patient taking differently: Take 20 mg by mouth daily. 04/07/20  Yes Camnitz, Michele Hassell Done, MD  diltiazem (CARDIZEM CD) 180 MG 24 hr capsule Take 180 mg by mouth daily. 11/05/20  Yes [provider]  hydrOXYzine (ATARAX/VISTARIL) 25 MG tablet Take 25 mg by mouth in the morning and at bedtime. 01/14/21  Yes [provider]  mesalamine (APRISO) 0.375 g 24 hr capsule Take 0.375 g by mouth daily. 06/01/19  Yes [provider]  metoprolol tartrate (LOPRESSOR) 50 MG tablet Take 75 mg by mouth 2 (two) times daily. 01/21/21  Yes [provider]  Multiple Vitamin (MULTIVITAMIN WITH MINERALS) TABS tablet Take 1 tablet by mouth daily. 08/24/20  Yes Debbe Odea, MD  oxyCODONE (OXY IR/ROXICODONE) 5 MG immediate release tablet Take 1 tablet (5 mg total) by mouth every 6 (six) hours as needed for severe pain. 01/21/21  Yes Arrien, Jimmy Picket, MD  predniSONE (DELTASONE) 10 MG tablet Take 2 tablets daily for 7 days, then 1 tablet daily for 7 days, then half tablet daily for 7 days, then  stop. 01/21/21  Yes Arrien, Jimmy Picket, MD  tamoxifen (NOLVADEX) 20 MG tablet Take 20 mg by mouth daily.   Yes [provider]  XARELTO 15 MG TABS tablet TAKE 1 TABLET BY MOUTH  DAILY WITH SUPPER Patient taking differently: Take 15 mg by mouth at bedtime. 10/06/20  Yes [name], MD  alendronate (FOSAMAX) 70 MG tablet Take 70 mg by mouth once a week. 09/24/20   [provider]  bisacodyl (DULCOLAX) 10 MG suppository Place 1 suppository (10 mg total) rectally daily as needed for moderate constipation. 09/19/20   Oswald Hillock, MD  cyclobenzaprine (FLEXERIL) 5 MG tablet Take 1 tablet (5 mg total) by mouth 3 (three) times daily as needed for muscle spasms. Patient not taking: Reported on 01/24/2021 09/19/20   Oswald Hillock, MD  docusate sodium (COLACE) 100 MG capsule Take 1 capsule (100 mg total) by mouth 2 (two) times daily. Patient not taking: Reported  on 01/24/2021 09/19/20   Oswald Hillock, MD  feeding supplement, ENSURE ENLIVE, (ENSURE ENLIVE) LIQD Take 237 mLs by mouth 2 (two) times daily between meals. Patient not taking: Reported on 01/24/2021 08/23/20   Debbe Odea, MD  Metoprolol Tartrate 75 MG TABS Take 75 mg by mouth 2 (two) times daily. Patient not taking: Reported on 01/24/2021 01/21/21 02/20/21  Arrien, Jimmy Picket, MD  thiamine 100 MG tablet Take 1 tablet (100 mg total) by mouth daily. 01/21/21 02/20/21  Arrien, Jimmy Picket, MD    Inpatient Medications: Scheduled Meds: . allopurinol  200 mg Oral QPM  . amiodarone  150 mg Intravenous Once  . amiodarone  200 mg Oral Q12H   Followed by  . [START ON 02/02/2021] amiodarone  200 mg Oral Daily  . atorvastatin  20 mg Oral Daily  . Chlorhexidine Gluconate Cloth  6 each Topical Daily  . hydrocortisone sod succinate (SOLU-CORTEF) inj  100 mg Intravenous Q8H  . mouth rinse  15 mL Mouth Rinse BID  . mesalamine  250 mg Oral BID  . midodrine  5 mg Oral TID WC  . Rivaroxaban  15 mg Oral QPC supper  . sodium chloride flush   10-40 mL Intracatheter Q12H  . tamoxifen  20 mg Oral Daily  . thiamine  100 mg Oral Daily   Continuous Infusions: . sodium chloride 30 mL/hr at 01/25/21 1104  . amiodarone     Followed by  . amiodarone    . cefTRIAXone (ROCEPHIN)  IV    . diltiazem (CARDIZEM) infusion     PRN Meds: acetaminophen **OR** acetaminophen, HYDROcodone-acetaminophen, sodium chloride flush  Allergies:    Allergies  Allergen Reactions  . Shellfish Allergy Itching  . Amoxicillin-Pot Clavulanate Diarrhea    Social History:   Social History   Socioeconomic History  . Marital status: Widowed    Spouse name: Not on file  . Number of children: 1  . Years of education: Not on file  . Highest education level: Not on file  Occupational History  . Not on file  Tobacco Use  . Smoking status: Former Smoker    Packs/day: 1.00    Years: 20.00    Pack years: 20.00    Types: Cigarettes    Quit date: 11/22/1992    Years since quitting: 28.1  . Smokeless tobacco: Never Used  Vaping Use  . Vaping Use: Never used  Substance and Sexual Activity  . Alcohol use: Yes    Alcohol/week: 3.0 standard drinks    Types: 3 Standard drinks or equivalent per week    Comment: social  . Drug use: No  . Sexual activity: Not Currently    Partners: Male    Birth control/protection: Post-menopausal  Other Topics Concern  . Not on file  Social History Narrative  . Not on file   Social Determinants of Health   Financial Resource Strain: Not on file  Food Insecurity: Not on file  Transportation Needs: Not on file  Physical Activity: Not on file  Stress: Not on file  Social Connections: Not on file  Intimate Partner Violence: Not on file    Family History:    Family History  Problem Relation Age of Onset  . Cirrhosis Mother   . Breast cancer Mother 92  . Heart failure Father   . Heart attack Father   . Breast cancer Sister        early 62's  . Kidney Stones Sister  loss of kidney due to stones  .  Osteoporosis Neg Hx      ROS:  Please see the history of present illness.  Constitutional: Negative for chills, fever, night sweats, unintentional weight loss. Positive for fatigue HENT: Negative for ear pain and hearing loss.   Eyes: Negative for loss of vision and eye pain.  Respiratory: Negative for cough, sputum, wheezing.   Cardiovascular: See HPI. Gastrointestinal: Negative for abdominal pain, melena, and hematochezia.  Genitourinary: Negative for dysuria and hematuria.  Musculoskeletal: Negative for falls and myalgias.  Skin: Negative for itching and rash.  Neurological: Negative for focal weakness, focal sensory changes and loss of consciousness.  Endo/Heme/Allergies: Does bruise/bleed easily.  All other ROS reviewed and negative.     Physical Exam/Data:   Vitals:   01/25/21 0500 01/25/21 0600 01/25/21 0700 01/25/21 0800  BP: (!) 115/59 119/65 113/65   Pulse: (!) 131 (!) 122 (!) 118   Resp: (!) 23 19 17    Temp:    (!) 97.4 F (36.3 C)  TempSrc:    Oral  SpO2: (!) 79% (!) 85% 95%   Weight:      Height:        Intake/Output Summary (Last 24 hours) at 01/25/2021 1109 Last data filed at 01/25/2021 1102 Gross per 24 hour  Intake 2360.01 ml  Output 250 ml  Net 2110.01 ml   Last 3 Weights 01/24/2021 01/20/2021 01/19/2021  Weight (lbs) 129 lb 10.1 oz 134 lb 14.7 oz 132 lb 4.4 oz  Weight (kg) 58.8 kg 61.2 kg 60 kg     Body mass index is 22.25 kg/m.  General:  Well nourished, well developed, in no acute distress HEENT: normal Lymph: no adenopathy Neck: JVD low neck at 30 degrees Endocrine:  No thryomegaly Vascular: No carotid bruits; RA pulses 2+ bilaterally Cardiac:  Largely regular with occasional missed beats, no murmurs appreciated Lungs:  clear to auscultation bilaterally, mild rhonchi Abd: soft, nontender, no hepatomegaly  Ext: trivial lower extremity edema bilaterally. Ecchymoses across lower extremities Musculoskeletal:  No deformities, moves all 4 limbs  independently Skin: warm and dry  Neuro:  no focal abnormalities noted Psych:  Normal affect   EKG:  The EKG was personally reviewed and demonstrates:  Atypical atrial flutter with 2:1 block Telemetry:  Telemetry was personally reviewed and demonstrates:  Intermittent atypical flutter at 2:1 (completely flat/stable line on telemetry) with brief paroxysms of sinus rhythm  Relevant CV Studies: Echo 01/17/21 1. s/p Mitral Valve Repair. Procedure Date: 08/10/2018.  2. Right ventricular systolic function is normal. The right ventricular  size is normal. There is moderately elevated pulmonary artery systolic  pressure. The estimated right ventricular systolic pressure is 75.1 mmHg.  3. Left atrial size was severely dilated.  4. The aortic valve is tricuspid. Aortic valve regurgitation is not  visualized. No aortic stenosis is present.  5. Tricuspid valve regurgitation is moderate.  6. Left ventricular ejection fraction, by estimation, is 60 to 65%. The  left ventricle has normal function. The left ventricle has no regional  wall motion abnormalities. Left ventricular diastolic parameters are  indeterminate.  7. The inferior vena cava is normal in size with <50% respiratory  variability, suggesting right atrial pressure of 8 mmHg.   Comparison(s): A prior study was performed on 09/05/2019. Prior images  reviewed side by side. Compared to prior increase in left atrial size,  mitral regurgitation, pulmonic regurgitation, and tricuspid regurgitation.   Laboratory Data:  High Sensitivity Troponin:   Recent Labs  Lab 01/16/21 1909 01/17/21 0524 01/24/21 1522 01/24/21 1707  TROPONINIHS 31* 30* 25* 24*     Chemistry Recent Labs  Lab 01/21/21 0442 01/24/21 1522 01/25/21 0534  NA 138 139 140  K 4.4 4.4 3.4*  CL 109 106 108  CO2 25 24 26   GLUCOSE 113* 117* 118*  BUN 17 16 12   CREATININE 0.58 0.78 0.56  CALCIUM 8.2* 8.1* 7.8*  GFRNONAA >60 >60 >60  ANIONGAP 4* 9 6    Recent  Labs  Lab 01/24/21 1522 01/25/21 0534  PROT 5.2* 4.8*  ALBUMIN 2.2* 2.3*  AST 80* 32  ALT 39 30  ALKPHOS 62 45  BILITOT 1.4* 0.9   Hematology Recent Labs  Lab 01/24/21 1610 01/24/21 1934 01/25/21 0534  WBC 13.8*  --  8.6  RBC 2.60* 2.47* 2.44*  HGB 8.0*  --  7.3*  HCT 25.8*  --  24.4*  MCV 99.2  --  100.0  MCH 30.8  --  29.9  MCHC 31.0  --  29.9*  RDW 15.9*  --  15.7*  PLT 123*  --  107*   BNP Recent Labs  Lab 01/24/21 1610  BNP 478.7*    DDimer No results for input(s): DDIMER in the last 168 hours.   Radiology/Studies:  DG Elbow 2 Views Right  Result Date: 01/24/2021 CLINICAL DATA:  Provided history of: Pain on the olcer Patient reports pain about the ulnar aspect of right elbow for 1 week. EXAM: RIGHT ELBOW - 2 VIEW COMPARISON:  None. FINDINGS: Frontal and lateral views. Normal alignment and joint spaces. No fracture, erosion, or bone destruction. There is a moderate elbow joint effusion. Soft tissue edema is most prominent about the posterior and ulnar aspect of the elbow. IMPRESSION: Moderate elbow joint effusion, nonspecific. Generalized soft tissue edema most prominent posterior medially. Electronically Signed   By: Keith Rake M.D.   On: 01/24/2021 17:26   DG Chest Port 1 View  Result Date: 01/24/2021 CLINICAL DATA:  Looking for infectious source. Recent UTI last week. Continued dysuria. Swelling and weeping of right upper arm. EXAM: PORTABLE CHEST 1 VIEW COMPARISON:  Chest x-ray 01/16/2021, CT chest 01/16/2021 FINDINGS: Left appendage clip, mitral annular valve replacement, as well as sternotomy wires are again noted. The heart size and mediastinal contours are within normal limits. Aortic arch calcifications. Biapical pleural/pulmonary scarring. No focal consolidation. Similar appearing coarsened interstitial markings. Persistent bilateral trace to small volume pleural effusions with streaky and patchy airspace opacities. No pneumothorax. No acute osseous  abnormality. Mid to lower thoracic body kyphoplasty again noted. IMPRESSION: Persistent bilateral trace to small volume pleural effusions with underlying infection/inflammation not excluded. Electronically Signed   By: Iven Finn M.D.   On: 01/24/2021 15:55     Assessment and Plan:   Permanent atrial fibrillation (based no plans for rhythm control) with intermittent episodes previously of sinus rhythm with second degree type I AV block (Wenkebach) and atypical atrial flutter -see summary of treatment history, above -amiodarone failure was for rhythm control. Prior comments note that if needed for rate control, this is reasonable -she also did well on digoxin. Normal renal function. This was stopped during prior admission due to level just above normal reference range, but no clinical symptoms -has issues with hypotension on diltiazem. Requires midodrine for use -we discussed options. I Michele start with IV amiodarone for rate control (and may get a byproduct of rhythm control, though do not suspect this Michele be permanent) as it is long acting once  loaded and does not need monitoring. Michele stop diltiazem given hypotension. -if she does not respond well to amiodarone for rate control, would then stop amiodarone and load with digoxin. Michele likely need to add back beta blocker with this strategy. She had slightly elevated digoxin level previously with no documented clinical issues, so would need to monitor digoxin levels and adjust dose.  -continue rivaroxaban, chadsvasc=5 -ideally would stop midodrine if blood pressure allows  CAD s/p CABG MR s/p MV repair 5681 Preserved systolic function, with indeterminate diastolic function and pulmonary hypertension -appears mildly volume up, but comfortable -diuresis PRN  We Michele continue to follow. Orders placed for amiodarone and stopping diltiazem drip  Complex medical history, with multiple considerations needed to consider potential therapy. High  level medical decision making.  Buford Dresser, MD, PhD, [facility]   Risk Assessment/Risk Scores:  CHA2DS2-VASc Score = 5  This indicates a 7.2% annual risk of stroke. The patient's score is based upon: CHF History: Yes HTN History: No Diabetes History: No Stroke History: No Vascular Disease History: Yes Age Score: 2 Gender Score: 1   For questions or updates, please contact [place] Please consult [email or web address] for contact info under    Signed, Buford Dresser, MD  01/25/2021 11:09 AM

## 2021-01-25 NOTE — Progress Notes (Signed)
[facility] PROGRESS NOTE   Michele Green:[id number] DOB: November 27, 1940 DOA: 01/24/2021  PCP: [name], MD  Brief History/Interval Summary: 80 y.o. 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  She went to a skilled nursing facility for short-term rehab.  Came back from there overnight due to feeling poorly with low oxygen levels.  She was found to be in atrial fibrillation with RVR.  She was noted to be hypotensive.  She was hospitalized for further management.  Consultants: Cardiology.  Orthopedics  Procedures: None yet  Antibiotics: Anti-infectives (From admission, onward)   Start     Dose/Rate Route Frequency Ordered Stop   01/25/21 1700  cefTRIAXone (ROCEPHIN) 1 g in sodium chloride 0.9 % 100 mL IVPB        1 g 200 mL/hr over 30 Minutes Intravenous Every 24 hours 01/24/21 2105     01/24/21 1745  cefTRIAXone (ROCEPHIN) 1 g in sodium chloride 0.9 % 100 mL IVPB        1 g 200 mL/hr over 30 Minutes Intravenous  Once 01/24/21 1734 01/24/21 1808      Subjective/Interval History: Patient denies any chest pain or shortness of breath this morning.  She is concerned about her right elbow which has been swollen and has been painful.  Denies any falls or injuries recently.  No nausea vomiting.    Assessment/Plan:  Atrial fibrillation with RVR Patient has previously been on metoprolol diltiazem and digoxin.  However she was found to have elevated digoxin level during her previous hospitalization so she was not discharged on it.  Her low blood pressure was a challenge.  Patient was given midodrine as well as placed on stress dose steroids.  She was  subsequently started on a diltiazem infusion.  Heart rate noted to be in the 120s.  Blood pressure stable.  We will go up on the Cardizem dose to 7.5 mg/h.  Discussed with nursing staff.  She is anticoagulated with rivaroxaban. Will consult cardiology since that this appears to be a recurrent issue.  Patient is status post mitral valve repair and maze procedure previously.  Based on previous reports it appears that she has not had any therapeutic effect with amiodarone previously.    Adrenal insufficiency with hypotension Patient currently on stress dose steroids.  Apparently has been on chronic steroids in the past for unknown reasons.  Blood pressure has stabilized.  She is on hydrocortisone 100 mg every 8 hours.  Continue for now.  She is also on midodrine which will also be continued.  Right elbow pain and swelling/for joint infection/history of gout Patient noted to be on allopurinol.  Uric acid level is pending.  She does have joint effusion based on imaging studies.  She is there is tenderness to palpation in the area is warm to touch.  WBC was elevated.  Procalcitonin was significantly elevated at 14.95.  Patient is currently on ceftriaxone which will be continued.  Discussed with orthopedics who will evaluate.  Patient may need arthrocentesis.  History of ulcerative colitis Could be the reason why she has required steroids previously.  Appears to be stable.  No diarrhea noted.  Noted to be on mesalamine which is being continued.  Mildly elevated troponin/chronic diastolic CHF/history of  mitral valve repair/maze procedure Elevated troponin likely due to tachycardia.  Patient does not have any anginal symptoms currently.  Last echocardiogram was in February 2022 which showed normal systolic function without any wall motion abnormalities.  Right ventricular systolic pressure was noted to be elevated.  We will also request cardiology to weigh in. She does have edema bilateral lower extremities.   It will be challenging to use diuretics due to her low blood pressure.  Concern for recurrent UTI Patient continues to have dysuria.  Follow-up on urine cultures.  Ceftriaxone being continued.  History of breast cancer Stable.  Tamoxifen being continued.  Anemia of chronic disease Drop in hemoglobin could be dilutional.  No evidence of overt bleeding.  She is on anticoagulation.  Monitor closely.  Recheck labs in the morning.  May need blood transfusion.  Functional quadriplegia PT and OT will be involved.  Dyslipidemia Continue statin.  Hypokalemia Magnesium 1.8.  Replace potassium.  Pressure injury Pressure Injury 12/02/20 Anus Medial Stage 1 -  Intact skin with non-blanchable redness of a localized area usually over a bony prominence. (Active)  12/02/20 0030  Location: Anus  Location Orientation: Medial  Staging: Stage 1 -  Intact skin with non-blanchable redness of a localized area usually over a bony prominence.  Wound Description (Comments):   Present on Admission: Yes     Pressure Injury 12/10/20 Buttocks Right;Bilateral Stage 1 -  Intact skin with non-blanchable redness of a localized area usually over a bony prominence. Redness to sacrum and groin (Active)  12/10/20 1600  Location: Buttocks  Location Orientation: Right;Bilateral  Staging: Stage 1 -  Intact skin with non-blanchable redness of a localized area usually over a bony prominence.  Wound Description (Comments): Redness to sacrum and groin  Present on Admission:      Pressure Injury 01/23/21 Heel Left Stage 1 -  Intact skin with non-blanchable redness of a localized area usually over a bony prominence. (Active)  01/23/21 2200  Location: Heel  Location Orientation: Left  Staging: Stage 1 -  Intact skin with non-blanchable redness of a localized area usually over a bony prominence.  Wound Description (Comments):   Present on Admission: Yes      DVT Prophylaxis: On rivaroxaban Code Status: DNR Family  Communication: Discussed with the patient Disposition Plan: Hopefully return to SNF for rehab when better.  Status is: Observation  The patient will require care spanning > 2 midnights and should be moved to inpatient because: IV treatments appropriate due to intensity of illness or inability to take PO and Inpatient level of care appropriate due to severity of illness  Dispo: The patient is from: SNF              Anticipated d/c is to: SNF              Patient currently is not medically stable to d/c.   Difficult to place patient No      Medications:  Scheduled: . allopurinol  200 mg Oral QPM  . atorvastatin  20 mg Oral Daily  . Chlorhexidine Gluconate Cloth  6 each Topical Daily  . hydrocortisone sod succinate (SOLU-CORTEF) inj  100 mg Intravenous Q8H  . mouth rinse  15 mL Mouth Rinse BID  . mesalamine  250 mg Oral BID  . midodrine  5 mg Oral TID WC  . Rivaroxaban  15 mg Oral QPC supper  . sodium chloride flush  10-40 mL Intracatheter Q12H  . tamoxifen  20 mg Oral Daily  .  thiamine  100 mg Oral Daily   Continuous: . cefTRIAXone (ROCEPHIN)  IV    . diltiazem (CARDIZEM) infusion 5 mg/hr (01/25/21 0600)   WYO:VZCHYIFOYDXAJ **OR** acetaminophen, HYDROcodone-acetaminophen, sodium chloride flush   Objective:  Vital Signs  Vitals:   01/25/21 0500 01/25/21 0600 01/25/21 0700 01/25/21 0800  BP: (!) 115/59 119/65 113/65   Pulse: (!) 131 (!) 122 (!) 118   Resp: (!) 23 19 17    Temp:    (!) 97.4 F (36.3 C)  TempSrc:    Oral  SpO2: (!) 79% (!) 85% 95%   Weight:      Height:        Intake/Output Summary (Last 24 hours) at 01/25/2021 0923 Last data filed at 01/25/2021 0600 Gross per 24 hour  Intake 1951.09 ml  Output 250 ml  Net 1701.09 ml   Filed Weights   01/24/21 2115  Weight: 58.8 kg    General appearance: Awake alert.  In no distress Resp: Clear to auscultation bilaterally.  Normal effort Cardio: S1-S2 is irregularly irregular and tachycardic.   GI: Abdomen is  soft.  Nontender nondistended.  Bowel sounds are present normal.  No masses organomegaly Extremities: 1+ edema bilateral lower extremities.  Swelling of the right elbow with warmth.  Tenderness is appreciated.  Some limitation in range of motion is noted. Neurologic: Alert and oriented x3.  No focal neurological deficits.    Lab Results:  Data Reviewed: I have personally reviewed following labs and imaging studies  CBC: Recent Labs  Lab 01/24/21 1610 01/25/21 0534  WBC 13.8* 8.6  NEUTROABS 13.0* 7.9*  HGB 8.0* 7.3*  HCT 25.8* 24.4*  MCV 99.2 100.0  PLT 123* 107*    Basic Metabolic Panel: Recent Labs  Lab 01/19/21 0447 01/21/21 0442 01/24/21 1522 01/24/21 1857 01/24/21 1934 01/25/21 0534  NA 139 138 139  --   --  140  K 3.6 4.4 4.4  --   --  3.4*  CL 110 109 106  --   --  108  CO2 23 25 24   --   --  26  GLUCOSE 116* 113* 117*  --   --  118*  BUN 14 17 16   --   --  12  CREATININE 0.76 0.58 0.78  --   --  0.56  CALCIUM 7.8* 8.2* 8.1*  --   --  7.8*  MG  --   --   --   --  1.7 1.8  PHOS  --   --   --  3.2  --  3.2    GFR: Estimated Creatinine Clearance: 46.8 mL/min (by C-G formula based on SCr of 0.56 mg/dL).  Liver Function Tests: Recent Labs  Lab 01/24/21 1522 01/25/21 0534  AST 80* 32  ALT 39 30  ALKPHOS 62 45  BILITOT 1.4* 0.9  PROT 5.2* 4.8*  ALBUMIN 2.2* 2.3*     Coagulation Profile: Recent Labs  Lab 01/24/21 1934  INR 1.5*    Cardiac Enzymes: Recent Labs  Lab 01/24/21 1934  CKTOTAL 10*     Thyroid Function Tests: Recent Labs    01/25/21 0534  TSH 0.690    Anemia Panel: Recent Labs    01/24/21 1934 01/25/21 0534  VITAMINB12  --  457  FOLATE  --  9.5  FERRITIN  --  146  TIBC  --  104*  IRON  --  54  RETICCTPCT 3.0  --     Recent Results (from the past 240 hour(s))  SARS CORONAVIRUS 2 (TAT 6-24 HRS) Nasopharyngeal Nasopharyngeal Swab     Status: None   Collection Time: 01/17/21  4:10 AM   Specimen: Nasopharyngeal Swab   Result Value Ref Range Status   SARS Coronavirus 2 NEGATIVE NEGATIVE Final    Comment: (NOTE) SARS-CoV-2 target nucleic acids are NOT DETECTED.  The SARS-CoV-2 RNA is generally detectable in upper and lower respiratory specimens during the acute phase of infection. Negative results do not preclude SARS-CoV-2 infection, do not rule out co-infections with other pathogens, and should not be used as the sole basis for treatment or other patient management decisions. Negative results must be combined with clinical observations, patient history, and epidemiological information. The expected result is Negative.  Fact Sheet for Patients: SugarRoll.be  Fact Sheet for Healthcare Providers: https://www.woods-mathews.com/  This test is not yet approved or cleared by the [place] FDA and  has been authorized for detection and/or diagnosis of SARS-CoV-2 by FDA under an Emergency Use Authorization (EUA). This EUA will remain  in effect (meaning this test can be used) for the duration of the COVID-19 declaration under Se ction 564(b)(1) of the Act, 21 U.S.C. section 360bbb-3(b)(1), unless the authorization is terminated or revoked sooner.  Performed at [facility], [place] [address] [place], [place] [address]   MRSA PCR Screening     Status: Abnormal   Collection Time: 01/17/21  4:55 AM  Result Value Ref Range Status   MRSA by PCR POSITIVE (A) NEGATIVE Final    Comment:        The GeneXpert MRSA Assay (FDA approved for NASAL specimens only), is one component of a comprehensive MRSA colonization surveillance program. It is not intended to diagnose MRSA infection nor to guide or monitor treatment for MRSA infections. RESULT CALLED TO, READ BACK BY AND VERIFIED WITHKandis Mannan RN AT [phone] 01/17/21 MULLINS,T  Performed at [facility], [place] [address] [place], [place] [address]   SARS CORONAVIRUS 2 (TAT 6-24 HRS)  Nasopharyngeal Nasopharyngeal Swab     Status: None   Collection Time: 01/20/21  6:35 PM   Specimen: Nasopharyngeal Swab  Result Value Ref Range Status   SARS Coronavirus 2 NEGATIVE NEGATIVE Final    Comment: (NOTE) SARS-CoV-2 target nucleic acids are NOT DETECTED.  The SARS-CoV-2 RNA is generally detectable in upper and lower respiratory specimens during the acute phase of infection. Negative results do not preclude SARS-CoV-2 infection, do not rule out co-infections with other pathogens, and should not be used as the sole basis for treatment or other patient management decisions. Negative results must be combined with clinical observations, patient history, and epidemiological information. The expected result is Negative.  Fact Sheet for Patients: SugarRoll.be  Fact Sheet for Healthcare Providers: https://www.woods-mathews.com/  This test is not yet approved or cleared by the [place] FDA and  has been authorized for detection and/or diagnosis of SARS-CoV-2 by FDA under an Emergency Use Authorization (EUA). This EUA will remain  in effect (meaning this test can be used) for the duration of the COVID-19 declaration under Se ction 564(b)(1) of the Act, 21 U.S.C. section 360bbb-3(b)(1), unless the authorization is terminated or revoked sooner.  Performed at [facility], [place] [address] [place], [place] [address]   Blood culture (routine x 2)     Status: None (Preliminary result)   Collection Time: 01/24/21  3:13 PM   Specimen: BLOOD  Result Value Ref Range Status   Specimen Description   Final  BLOOD BLOOD LEFT ARM Performed at [facility] [address] [place], [place] [address]    Special Requests   Final    BOTTLES DRAWN AEROBIC AND ANAEROBIC Blood Culture adequate volume Performed at [facility] [address], [place], [place] [address]    Culture   Final    NO GROWTH <  12 HOURS Performed at [facility] [address] [place], [place] [address]    Report Status PENDING  Incomplete  Blood culture (routine x 2)     Status: None (Preliminary result)   Collection Time: 01/24/21  3:22 PM   Specimen: BLOOD  Result Value Ref Range Status   Specimen Description   Final    BLOOD LEFT ARM Performed at [facility] [address] [place], [place] [address]    Special Requests   Final    BOTTLES DRAWN AEROBIC AND ANAEROBIC Blood Culture adequate volume Performed at [facility] [address] [place], [place] [address]    Culture   Final    NO GROWTH < 12 HOURS Performed at [facility] [address] [place], [place] [address]    Report Status PENDING  Incomplete  Resp Panel by RT-PCR (Flu A&B, Covid) Nasopharyngeal Swab     Status: None   Collection Time: 01/24/21  5:27 PM   Specimen: Nasopharyngeal Swab; Nasopharyngeal(NP) swabs in vial transport medium  Result Value Ref Range Status   SARS Coronavirus 2 by RT PCR NEGATIVE NEGATIVE Final    Comment: (NOTE) SARS-CoV-2 target nucleic acids are NOT DETECTED.  The SARS-CoV-2 RNA is generally detectable in upper respiratory specimens during the acute phase of infection. The lowest concentration of SARS-CoV-2 viral copies this assay can detect is 138 copies/mL. A negative result does not preclude SARS-Cov-2 infection and should not be used as the sole basis for treatment or other patient management decisions. A negative result may occur with  improper specimen collection/handling, submission of specimen other than nasopharyngeal swab, presence of viral mutation(s) within the areas targeted by this assay, and inadequate number of viral copies(<138 copies/mL). A negative result must be combined with clinical observations, patient history, and epidemiological information. The expected result is Negative.  Fact Sheet for Patients:   EntrepreneurPulse.com.au  Fact Sheet for Healthcare Providers:  IncredibleEmployment.be  This test is no t yet approved or cleared by the [place] FDA and  has been authorized for detection and/or diagnosis of SARS-CoV-2 by FDA under an Emergency Use Authorization (EUA). This EUA will remain  in effect (meaning this test can be used) for the duration of the COVID-19 declaration under Section 564(b)(1) of the Act, 21 U.S.C.section 360bbb-3(b)(1), unless the authorization is terminated  or revoked sooner.       Influenza A by PCR NEGATIVE NEGATIVE Final   Influenza B by PCR NEGATIVE NEGATIVE Final    Comment: (NOTE) The Xpert Xpress SARS-CoV-2/FLU/RSV plus assay is intended as an aid in the diagnosis of influenza from Nasopharyngeal swab specimens and should not be used as a sole basis for treatment. Nasal washings and aspirates are unacceptable for Xpert Xpress SARS-CoV-2/FLU/RSV testing.  Fact Sheet for Patients: EntrepreneurPulse.com.au  Fact Sheet for Healthcare Providers: IncredibleEmployment.be  This test is not yet approved or cleared by the [place] FDA and has been authorized for detection and/or diagnosis of SARS-CoV-2 by FDA under an Emergency Use Authorization (EUA). This EUA will remain in effect (meaning this test can be  used) for the duration of the COVID-19 declaration under Section 564(b)(1) of the Act, 21 U.S.C. section 360bbb-3(b)(1), unless the authorization is terminated or revoked.  Performed at [facility], [place] [address], [place], [place] [address]       Radiology Studies: DG Elbow 2 Views Right  Result Date: 01/24/2021 CLINICAL DATA:  Provided history of: Pain on the olcer Patient reports pain about the ulnar aspect of right elbow for 1 week. EXAM: RIGHT ELBOW - 2 VIEW COMPARISON:  None. FINDINGS: Frontal and lateral views. Normal alignment and  joint spaces. No fracture, erosion, or bone destruction. There is a moderate elbow joint effusion. Soft tissue edema is most prominent about the posterior and ulnar aspect of the elbow. IMPRESSION: Moderate elbow joint effusion, nonspecific. Generalized soft tissue edema most prominent posterior medially. Electronically Signed   By: Keith Rake M.D.   On: 01/24/2021 17:26   DG Chest Port 1 View  Result Date: 01/24/2021 CLINICAL DATA:  Looking for infectious source. Recent UTI last week. Continued dysuria. Swelling and weeping of right upper arm. EXAM: PORTABLE CHEST 1 VIEW COMPARISON:  Chest x-ray 01/16/2021, CT chest 01/16/2021 FINDINGS: Left appendage clip, mitral annular valve replacement, as well as sternotomy wires are again noted. The heart size and mediastinal contours are within normal limits. Aortic arch calcifications. Biapical pleural/pulmonary scarring. No focal consolidation. Similar appearing coarsened interstitial markings. Persistent bilateral trace to small volume pleural effusions with streaky and patchy airspace opacities. No pneumothorax. No acute osseous abnormality. Mid to lower thoracic body kyphoplasty again noted. IMPRESSION: Persistent bilateral trace to small volume pleural effusions with underlying infection/inflammation not excluded. Electronically Signed   By: Iven Finn M.D.   On: 01/24/2021 15:55       LOS: 0 days   [facility] Pager on [email or web address]  01/25/2021, 9:23 AM

## 2021-01-25 NOTE — Progress Notes (Signed)
PT Cancellation Note  Patient Details Name: Michele Green MRN: [id number] DOB: [birth year]   Cancelled Treatment:     PT order received but eval deferred at request of RN 2* pt's elevated HR.  Will follow.  [place] Pager [phone] Office [phone]    [facility] 01/25/2021, 8:59 AM

## 2021-01-26 ENCOUNTER — Other Ambulatory Visit: Payer: Self-pay

## 2021-01-26 ENCOUNTER — Encounter (HOSPITAL_COMMUNITY): Payer: Self-pay | Admitting: Internal Medicine

## 2021-01-26 DIAGNOSIS — E876 Hypokalemia: Secondary | ICD-10-CM | POA: Diagnosis not present

## 2021-01-26 DIAGNOSIS — I471 Supraventricular tachycardia: Secondary | ICD-10-CM

## 2021-01-26 DIAGNOSIS — I9589 Other hypotension: Secondary | ICD-10-CM

## 2021-01-26 DIAGNOSIS — E271 Primary adrenocortical insufficiency: Secondary | ICD-10-CM | POA: Diagnosis not present

## 2021-01-26 DIAGNOSIS — I4891 Unspecified atrial fibrillation: Secondary | ICD-10-CM | POA: Diagnosis not present

## 2021-01-26 DIAGNOSIS — D638 Anemia in other chronic diseases classified elsewhere: Secondary | ICD-10-CM | POA: Diagnosis not present

## 2021-01-26 LAB — CBC
HCT: 25.1 % — ABNORMAL LOW (ref 36.0–46.0)
Hemoglobin: 7.6 g/dL — ABNORMAL LOW (ref 12.0–15.0)
MCH: 30.2 pg (ref 26.0–34.0)
MCHC: 30.3 g/dL (ref 30.0–36.0)
MCV: 99.6 fL (ref 80.0–100.0)
Platelets: 120 10*3/uL — ABNORMAL LOW (ref 150–400)
RBC: 2.52 MIL/uL — ABNORMAL LOW (ref 3.87–5.11)
RDW: 15.5 % (ref 11.5–15.5)
WBC: 6.5 10*3/uL (ref 4.0–10.5)
nRBC: 0 % (ref 0.0–0.2)

## 2021-01-26 LAB — URINE CULTURE

## 2021-01-26 LAB — COMPREHENSIVE METABOLIC PANEL
ALT: 30 U/L (ref 0–44)
AST: 25 U/L (ref 15–41)
Albumin: 2.2 g/dL — ABNORMAL LOW (ref 3.5–5.0)
Alkaline Phosphatase: 46 U/L (ref 38–126)
Anion gap: 7 (ref 5–15)
BUN: 12 mg/dL (ref 8–23)
CO2: 25 mmol/L (ref 22–32)
Calcium: 7.7 mg/dL — ABNORMAL LOW (ref 8.9–10.3)
Chloride: 107 mmol/L (ref 98–111)
Creatinine, Ser: 0.5 mg/dL (ref 0.44–1.00)
GFR, Estimated: >60 mL/min (ref >60)
Glucose, Bld: 124 mg/dL — ABNORMAL HIGH (ref 70–99)
Potassium: 3.4 mmol/L — ABNORMAL LOW (ref 3.5–5.1)
Sodium: 139 mmol/L (ref 135–145)
Total Bilirubin: 1 mg/dL (ref 0.3–1.2)
Total Protein: 4.9 g/dL — ABNORMAL LOW (ref 6.5–8.1)

## 2021-01-26 LAB — PROCALCITONIN: Procalcitonin: 4.09 ng/mL

## 2021-01-26 LAB — MAGNESIUM: Magnesium: 1.7 mg/dL (ref 1.7–2.4)

## 2021-01-26 MED ORDER — HYDROCORTISONE NA SUCCINATE PF 100 MG IJ SOLR
50.0000 mg | Freq: Three times a day (TID) | INTRAMUSCULAR | Status: DC
  Administered 2021-01-26 – 2021-01-27 (×3): 50 mg via INTRAVENOUS
  Filled 2021-01-26 (×3): qty 2

## 2021-01-26 MED ORDER — MAGNESIUM SULFATE 2 GM/50ML IV SOLN
2.0000 g | Freq: Once | INTRAVENOUS | Status: AC
  Administered 2021-01-26: 2 g via INTRAVENOUS
  Filled 2021-01-26: qty 50

## 2021-01-26 MED ORDER — MIDODRINE HCL 5 MG PO TABS
2.5000 mg | ORAL_TABLET | Freq: Three times a day (TID) | ORAL | Status: DC
  Administered 2021-01-26 – 2021-01-27 (×3): 2.5 mg via ORAL
  Filled 2021-01-26 (×3): qty 1

## 2021-01-26 MED ORDER — LIP MEDEX EX OINT
TOPICAL_OINTMENT | CUTANEOUS | Status: DC | PRN
  Filled 2021-01-26 (×3): qty 7

## 2021-01-26 MED ORDER — POTASSIUM CHLORIDE CRYS ER 20 MEQ PO TBCR
40.0000 meq | EXTENDED_RELEASE_TABLET | Freq: Two times a day (BID) | ORAL | Status: AC
  Administered 2021-01-26 (×2): 40 meq via ORAL
  Filled 2021-01-26 (×2): qty 2

## 2021-01-26 MED ORDER — BOOST / RESOURCE BREEZE PO LIQD CUSTOM
1.0000 | Freq: Two times a day (BID) | ORAL | Status: DC
  Administered 2021-01-26 – 2021-02-02 (×13): 1 via ORAL

## 2021-01-26 MED ORDER — ADULT MULTIVITAMIN W/MINERALS CH
1.0000 | ORAL_TABLET | Freq: Every day | ORAL | Status: DC
  Administered 2021-01-27 – 2021-01-31 (×5): 1 via ORAL
  Filled 2021-01-26 (×6): qty 1

## 2021-01-26 NOTE — Evaluation (Signed)
Occupational Therapy Evaluation Patient Details Name: Michele Green MRN: [id number] DOB: 1941/04/24 Today's Date: 01/26/2021    History of Present Illness Michele Green is a 80 y.o. female with medical history significant for atrial fibrillation on Xarelto, history of breast cancer, CAD, history of Maze procedure and mitral valve repair, chronic pain, ulcerative colitis, and 2 L/min supplemental oxygen requirement. Patient re-presented to the emergency department for evaluation of shortness of breath and oozing RT elbow with RUE pain. Pt found to be in A-Fib with RVR.  Patient was just recently hospitalized on 01/16/2021 for shortness of breath and lightheadeness. Pt was also previously hospitalized with COVID and was discharged to a SNF on 12/11/2020, moved to an ALF and reports that she has been bedbound ever since the hospital discharge and was unable to sit up in bed without assistance.  She has been requiring 2 L/min of supplemental oxygen since the hospital discharges.   Clinical Impression   Patient is currently requiring assistance with ADLs including total assist with toileting, and with LE dressing, maximum assist with bathing, and moderate assist with UE dressing, all of which is below patient's typical baseline of being Independent.  During this evaluation, patient was limited by lack of motivation, decreased mood, all over body pain, poor activity tolerance and severe weakness, which has the potential to impact patient's safety and independence during functional mobility, as well as performance for ADLs. [place] "6-clicks" Daily Activity Inpatient Short Form score of 12/24 indicates 66.57% ADL impairment this session. Patient lives alone at an ALF.   PLOF is uncertain due to multiple hospitalizations and rehab stays since January.  Patient demonstrates guarded rehab potential, and could benefit from continued skilled occupational therapy services while in acute care to  maximize safety, independence and quality of life at home.  Continued occupational therapy services in a SNF vs LTACH setting prior to return home is recommended.  ?     Follow Up Recommendations  SNF;LTACH (SNF vs LTACH)    Equipment Recommendations  Other (comment) (Will defer to post-acute recommendations.)    Recommendations for Other Services       Precautions / Restrictions Precautions Precautions: Fall Precaution Comments: Fragile skin Restrictions Weight Bearing Restrictions: No      Mobility Bed Mobility Overal bed mobility: Needs Assistance Bed Mobility: Rolling;Sidelying to Sit;Sit to Sidelying Rolling: Max assist Sidelying to sit: Max assist       General bed mobility comments: Pt guarding with all movements, presumably from body pains. Pt not initiating movements, stating, "you have to help me."    Transfers Overall transfer level: Needs assistance Equipment used: 2 person hand held assist Transfers: Sit to/from Stand Sit to Stand: Max assist;+2 physical assistance Stand pivot transfers: +2 physical assistance;Max assist       General transfer comment: To pivot to recliner.  Max As of 2 people for anterior scooting to EOB and Total Assist of 2 for posterior scooting in relciner.    Balance Overall balance assessment: Needs assistance Sitting-balance support: Feet supported;Bilateral upper extremity supported Sitting balance-Leahy Scale: Poor Sitting balance - Comments: Very weak trunk. Postural control: Posterior lean   Standing balance-Leahy Scale: Poor Standing balance comment: Reliant on external support of 2 people.                           ADL either performed or assessed with clinical judgement   ADL Overall ADL's : Needs assistance/impaired Eating/Feeding: Set  up;Bed level   Grooming: Set up;Bed level Grooming Details (indicate cue type and reason): Based on general assessment. Pt refusing grooming and stated that she would  "do it later". Grooming supplies setup within easy reach of pt in bedside table. Upper Body Bathing: Moderate assistance;Bed level   Lower Body Bathing: Total assistance;Sitting/lateral leans;Bed level   Upper Body Dressing : Maximal assistance;Sitting   Lower Body Dressing: Total assistance;Bed level;Sitting/lateral leans Lower Body Dressing Details (indicate cue type and reason): Pt did kick her foot forward to allow OT to pull socks up while pt sat EOB. Toilet Transfer: Maximal assistance;+2 for physical assistance Toilet Transfer Details (indicate cue type and reason): Attempted stand from EOB with RW and pt required assist to lower back to EOB due to inable to come fully upright and bring hands to walker.  Pt performed 2nd stand with Max As of 2 people using hand held techinque and pivoted to recliner with Max Assist of 2 people. Toileting- Clothing Manipulation and Hygiene: +2 for physical assistance;Total assistance;Sitting/lateral lean;Bed level Toileting - Clothing Manipulation Details (indicate cue type and reason): Currently on pure wick. Pt found to be a little soiled once mobilizing and required Total Assist for peri hygiene and cream application at bed level.     Functional mobility during ADLs: Maximal assistance;Total assistance;+2 for physical assistance General ADL Comments: Unable with RW. REquired 2 person HHA     Vision Baseline Vision/History: Wears glasses Wears Glasses: At all times Patient Visual Report: No change from baseline Vision Assessment?: No apparent visual deficits     Perception     Praxis      Pertinent Vitals/Pain Pain Assessment: Faces Faces Pain Scale: Hurts even more Pain Location: "all over". Pt unable to localize. Pain Descriptors / Indicators: Sore;Aching Pain Intervention(s): Monitored during session;Limited activity within patient's tolerance;Repositioned (heels floating in recliner.)     Hand Dominance Right   Extremity/Trunk  Assessment Upper Extremity Assessment Upper Extremity Assessment: Generalized weakness   Lower Extremity Assessment Lower Extremity Assessment: Generalized weakness   Cervical / Trunk Assessment Cervical / Trunk Assessment: Kyphotic (Stooped in standing)   Communication Communication Communication: No difficulties   Cognition Arousal/Alertness: Awake/alert Behavior During Therapy: Flat affect;Anxious Overall Cognitive Status: No family/caregiver present to determine baseline cognitive functioning                                 General Comments: Confused and anxious.  Depressed appearing mood. Needs encouragement, but cooperative within her tolerance.   General Comments       Exercises Other Exercises Other Exercises: Pt encouragated to move extremities in bed or recliner ad lib to increase strength and reduce body stiffness.   Shoulder Instructions      Home Living Family/patient expects to be discharged to:: Assisted living     Type of Home: Assisted living Home Access: Level entry     Home Layout: One level     Bathroom Shower/Tub: Occupational psychologist: Handicapped height     Home Equipment: Environmental consultant - 2 wheels;Walker - 4 wheels;Wheelchair - manual;Shower seat;Grab bars - toilet   Additional Comments: pt was in independent home at [facility] prior to [facility] admission, then went to Short Term-SNF , then recently to ALF at [facility] only for 1-2 days and did not do well.      Prior Functioning/Environment Level of Independence: Independent with assistive device(s) (Prior to January 2022.)  OT Problem List: Decreased strength;Cardiopulmonary status limiting activity;Decreased cognition;Decreased activity tolerance;Decreased safety awareness;Impaired balance (sitting and/or standing);Decreased knowledge of use of DME or AE;Decreased knowledge of precautions;Impaired UE functional use;Pain      OT  Treatment/Interventions: Self-care/ADL training;Therapeutic exercise;Therapeutic activities;Cognitive remediation/compensation;Energy conservation;DME and/or AE instruction;Patient/family education;Balance training    OT Goals(Current goals can be found in the care plan section) Acute Rehab OT Goals Patient Stated Goal: Pt only asking to nap. ADL Goals Pt Will Perform Upper Body Dressing: sitting;with set-up;with supervision Pt Will Perform Lower Body Dressing: with adaptive equipment;with min assist;sit to/from stand;sitting/lateral leans Pt Will Transfer to Toilet: with supervision;stand pivot transfer;grab bars Pt Will Perform Toileting - Clothing Manipulation and hygiene: with min assist;sitting/lateral leans Pt/caregiver will Perform Home Exercise Program: Increased strength;Increased ROM;Both right and left upper extremity;With Supervision (Towel on table top to start) Additional ADL Goal #1: Pt will identify at least 2 personal reasons to support mobility and engagement in her own self care with goal of increasing pt's motivation for participation with mobiilty and ADLs.  OT Frequency: Min 2X/week   Barriers to D/C: Other (comment)          Co-evaluation              AM-PAC OT "6 Clicks" Daily Activity     Outcome Measure Help from another person eating meals?: A Little Help from another person taking care of personal grooming?: A Little Help from another person toileting, which includes using toliet, bedpan, or urinal?: Total Help from another person bathing (including washing, rinsing, drying)?: A Lot Help from another person to put on and taking off regular upper body clothing?: A Lot Help from another person to put on and taking off regular lower body clothing?: Total 6 Click Score: 12   End of Session Equipment Utilized During Treatment: Gait belt;Rolling walker Nurse Communication: Mobility status  Activity Tolerance: Patient limited by fatigue;Patient limited by  pain Patient left: with call bell/phone within reach;in chair;with chair alarm set  OT Visit Diagnosis: Unsteadiness on feet (R26.81);Other symptoms and signs involving cognitive function;Muscle weakness (generalized) (M62.81);Pain Pain - part of body:  ("All over")                Time: 3570-1779 OT Time Calculation (min): 23 min Charges:  OT General Charges $OT Visit: 1 Visit OT Evaluation $OT Eval Low Complexity: [place], OT Acute Rehab Services Office: [phone] 01/26/2021  Julien Girt 01/26/2021, 10:07 AM

## 2021-01-26 NOTE — Consult Note (Signed)
WOC Nurse Consult Note: Reason for Consult: Deep tissue injury to bilateral heels, present on admission.  Patient is frustrated and irritable this AM.  Is up in chair, requesting to be positioned so she can push off with her feet.  Due to being up in the chair, there is no foot board or anything to push off. I offered to place pillow under calf to float heels and she agreed.   Wound type:deep tissue injury to bilateral heels Nonblanchable deep ruborous injury.  Does not resolve with offloading.  Pressure Injury POA: Yes Measurement: Left heel 3 cm x 3 cm erythematous lesion Right heel:  4 cm x 3 cm erythematous lesion Multiple skin tears to arms and legs.  Covered with foam dressings.  Wound DSK:AJGOTL heels Drainage (amount, consistency, odor) none Periwound:intact   Legs are red and tender to touch, she reports Dressing procedure/placement/frequency:Silicone foam dressing to heels.  Change every three days and PRN soilage.   Offload pressure with PRevalon boots.  Will not follow at this time.  Please re-consult if needed.  [name] MSN, RN, FNP-BC CWON Wound, Ostomy, Continence Nurse Pager [phone]

## 2021-01-26 NOTE — Progress Notes (Signed)
Physical Therapy Treatment Patient Details Name: Michele Green MRN: [id number] DOB: 23-Feb-1941 Today's Date: 01/26/2021    History of Present Illness Michele Green is a 80 y.o. female with medical history significant for atrial fibrillation on Xarelto, history of breast cancer, CAD, history  mitral valve repair, chronic pain, ulcerative colitis, and 2 L/min supplemental oxygen requirement, covid in 11/2020.[name] Patient re-presented to the emergency department3/5/22  for evaluation of shortness of breath and oozing RT elbow with RUE pain. Pt found to be in A-Fib with RVR.  Recently hospitalized on 2/25- 01/21/21 for shortness of breath and lightheadeness.DC'd to [facility] ALF.[name]    PT Comments    Assisted  Patient from recliner to bed via [facility] lift equipment. Patient requires extensive assistance of 2 for rolling and repositioning in bed. R UE weeping. RN aware. Continue PT efforts for mobility/  Follow Up Recommendations  SNF     Equipment Recommendations  None recommended by PT    Recommendations for Other Services       Precautions / Restrictions Precautions Precautions: Fall Precaution Comments: Fragile skin, RUE weepin, sore on Left elbow    Mobility  Bed Mobility   Bed Mobility: Rolling Rolling: +2 for physical assistance;+2 for safety/equipment;Total assist         General bed mobility comments: Pt guarding with all movements, presumably from body pains. Pt not initiating movements, stating, "you have to help me." Use of bed pad to roll to each side    Transfers                 General transfer comment: maxisky to return to bed  Ambulation/Gait                 Stairs             Wheelchair Mobility    Modified Rankin (Stroke Patients Only)       Balance                                            Cognition Arousal/Alertness: Awake/alert Behavior During Therapy: Flat affect;Anxious                                    General Comments: Confused and anxious.  Depressed appearing mood. Needs encouragement, but cooperative within her tolerance.      Exercises      General Comments        Pertinent Vitals/Pain Faces Pain Scale: Hurts whole lot Pain Location: "all over". Pt unable to localize. Pain Descriptors / Indicators: Sore;Aching Pain Intervention(s): Monitored during session;Limited activity within patient's tolerance    Home Living                      Prior Function            PT Goals (current goals can now be found in the care plan section) Progress towards PT goals: Progressing toward goals    Frequency    Min 2X/week      PT Plan Current plan remains appropriate;Frequency needs to be updated    Co-evaluation              AM-PAC PT "6 Clicks" Mobility   Outcome Measure  Help needed turning from your back to your  side while in a flat bed without using bedrails?: Total Help needed moving from lying on your back to sitting on the side of a flat bed without using bedrails?: Total Help needed moving to and from a bed to a chair (including a wheelchair)?: Total Help needed standing up from a chair using your arms (e.g., wheelchair or bedside chair)?: Total Help needed to walk in hospital room?: Total Help needed climbing 3-5 steps with a railing? : Total 6 Click Score: 6    End of Session Equipment Utilized During Treatment: Oxygen Activity Tolerance: Patient limited by fatigue;Patient limited by pain Patient left: in bed;with call bell/phone within reach;with bed alarm set;with nursing/sitter in room Nurse Communication: Mobility status;Need for lift equipment PT Visit Diagnosis: Muscle weakness (generalized) (M62.81)     Time: 8676-7209 PT Time Calculation (min) (ACUTE ONLY): 27 min  Charges:  $Therapeutic Activity: 23-37 mins                     Tresa Endo PT Acute Rehabilitation Services Pager [phone] Office  [phone]    Claretha Cooper 01/26/2021, 3:48 PM

## 2021-01-26 NOTE — Progress Notes (Signed)
Initial Nutrition Assessment  DOCUMENTATION CODES:   Not applicable  INTERVENTION:  - will order Boost Breeze BID, each supplement provides 250 kcal and 9 grams of protein. - will order Magic Cup BID with meals, each supplement provides 290 kcal and 9 grams of protein - will order 1 tablet multivitamin with minerals/day.    NUTRITION DIAGNOSIS:   Increased nutrient needs related to acute illness,chronic illness as evidenced by estimated needs.  GOAL:   Patient will meet greater than or equal to 90% of their needs  MONITOR:   PO intake,Supplement acceptance,Labs,Weight trends  REASON FOR ASSESSMENT:   Consult Assessment of nutrition requirement/status  ASSESSMENT:   80 y.o. female with medical history of afib, breast cancer, CAD, mitral valve repair s/p Maze procedure, ulcerative colitis, and chronic hypoxic respiratory failure. She was dx with COVID-19 IN 11/2020. After admission for COVID, she discharged to SNF. Patient presented to the ED due to feeling poorly and being noted to have low O2 sats. In the ED, she was found to be in afib with RVR and was hypotensive.  She was discussed in rounds this AM.   Patient sitting in the chair with no family or visitors at bedside. No intakes documented since admission. She did not eat breakfast this AM, but denies abdominal pain/pressure or nausea, and she requested RD order her chocolate ice cream and chocolate pudding. Also ordered her fresh fruit cup and chicken noodle soup.   Patient reports that she greatly enjoys sweet items. Unable to obtain any other information at this time as patient is fixated on wanting chair adjusted and to be re-positioned. Alerted RN.   She was assessed remotely by this RD on 3/1 at which time she was eating 25-80% at meals.   Weight on 3/5 was 130 lb and weight on 3/1 was 135 lb. This indicates 5 lb weight loss (3.7% body weight) in the past 1 week. Weight on 3/1 was an outlier weight compared to weight  recordings over the past 6 months.   Patient is from [facility] and plan, at this time, is for her to return there at the time of d/c.   Labs reviewed; K: 3.4 mmol/l, Ca: 7.7 mg/dl. Medications reviewed; 50 mg solu-cortef TID, 2 g IV Mg sulfate x1 run 3/7, 40 mEq Klor-Con x2 doses 3/7, 100 mg thiamine/day.     NUTRITION - FOCUSED PHYSICAL EXAM:  completed; no muscle or fat depletions, mild edema to all extremities.   Diet Order:   Diet Order            Diet Heart Room service appropriate? Yes; Fluid consistency: Thin  Diet effective now                 EDUCATION NEEDS:   Not appropriate for education at this time  Skin:  Skin Assessment: Skin Integrity Issues: Skin Integrity Issues:: Stage I Stage I: L heel  Last BM:  3/6 (type 5 x1)  Height:   Ht Readings from Last 1 Encounters:  01/24/21 5' 4"  (1.626 m)    Weight:   Wt Readings from Last 1 Encounters:  01/24/21 58.8 kg    Ideal Body Weight:  54.54 kg  BMI:  Body mass index is 22.25 kg/m.  Estimated Nutritional Needs:   Kcal:  1700-1900 kcal  Protein:  80-95 grams  Fluid:  >/= 1.8 L/day      Jarome Matin, MS, RD, LDN, CNSC Inpatient Clinical Dietitian RD pager # available in [facility]  After  hours/weekend pager # available in [facility]

## 2021-01-26 NOTE — Progress Notes (Addendum)
[facility] PROGRESS NOTE   Michele Green BSW:[id number] DOB: [birth year] DOA: 01/24/2021  PCP: [name], MD  Brief History/Interval Summary: 80 y.o. 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  She went to a skilled nursing facility for short-term rehab.  Came back from there overnight due to feeling poorly with low oxygen levels.  She was found to be in atrial fibrillation with RVR.  She was noted to be hypotensive.  She was hospitalized for further management.  Consultants: Cardiology.  Orthopedics  Procedures: None yet  Antibiotics: Anti-infectives (From admission, onward)   Start     Dose/Rate Route Frequency Ordered Stop   01/25/21 1700  cefTRIAXone (ROCEPHIN) 1 g in sodium chloride 0.9 % 100 mL IVPB        1 g 200 mL/hr over 30 Minutes Intravenous Every 24 hours 01/24/21 2105     01/24/21 1745  cefTRIAXone (ROCEPHIN) 1 g in sodium chloride 0.9 % 100 mL IVPB        1 g 200 mL/hr over 30 Minutes Intravenous  Once 01/24/21 1734 01/24/21 1808      Subjective/Interval History: Patient denies any chest pain or shortness of breath.  Denies any dizziness or lightheadedness.  Right elbow seems to be slightly better still painful.      Assessment/Plan:  Atrial fibrillation with RVR Patient has previously been on metoprolol diltiazem and digoxin.  However she was found to have elevated digoxin level during her previous hospitalization so she was not discharged on it.   Admitted again with atrial fibrillation with RVR.  Was initially on diltiazem infusion.  Low blood pressure was a challenge.  Patient requiring midodrine and also on stress dose steroids.   Blood pressures have stabilized some.  Cardiology was consulted yesterday who have transitioned the patient to amiodarone infusion.  She is already anticoagulated with rivaroxaban.  Defer management to cardiology at this time.   Patient is status post mitral valve repair and maze procedure previously.  Hopefully will be able to get her off of the midodrine as her blood pressure stabilizes.  Adrenal insufficiency with hypotension Patient currently on stress dose steroids.  Apparently has been on chronic steroids in the past for unknown reasons.   Blood pressure appears to have stabilized.  Start titrating down the hydrocortisone.  Also start cutting back on midodrine.    Right elbow pain and swelling/for joint infection/history of gout Patient noted to be on allopurinol.  Uric acid level was only 3.7.  Joint effusion was noted on imaging studies.  WBC was noted to be elevated.  Seen by orthopedics.  Appreciate their input.  Conservative management is recommended at this time.  No need for thoracentesis per orthopedics.    History of ulcerative colitis Could be the reason why she has required steroids previously.  Appears to be stable.  No diarrhea noted.  Noted to be on mesalamine which is being continued.  Mildly elevated troponin/chronic diastolic CHF/history of mitral valve repair/maze procedure Elevated troponin likely due to tachycardia.  Patient does not have any anginal symptoms currently.  Last echocardiogram was in February 2022 which showed normal left ventricular systolic function without any wall motion abnormalities.  Right ventricular systolic pressure was noted to be elevated.  She does have edema bilateral lower extremities.  It will be challenging to use diuretics due to her low blood pressure.  Stable.  Concern for recurrent UTI Patient continued on ceftriaxone due to persistent dysuria.  Follow-up on urine cultures.  Blood cultures are negative so far.  Procalcitonin level noted  to be better.  History of breast cancer Stable.  Tamoxifen being continued.  Anemia of chronic disease/thrombocytopenia Drop in hemoglobin appears to be dilutional.  No evidence of overt bleeding. Stable this morning.  She is noted to be on anticoagulation.  Continue to monitor closely.  Anemia panel reviewed.  No clear-cut deficiencies identified.  Functional quadriplegia PT and OT will be involved.  Dyslipidemia Continue statin.  Hypokalemia Replace potassium.  Magnesium is borderline low at 1.7 and will be repleted as well.  Pressure injury Wound care nurse is following Pressure Injury 12/02/20 Anus Medial Stage 1 -  Intact skin with non-blanchable redness of a localized area usually over a bony prominence. (Active)  12/02/20 0030  Location: Anus  Location Orientation: Medial  Staging: Stage 1 -  Intact skin with non-blanchable redness of a localized area usually over a bony prominence.  Wound Description (Comments):   Present on Admission: Yes     Pressure Injury 12/10/20 Buttocks Right;Bilateral Stage 1 -  Intact skin with non-blanchable redness of a localized area usually over a bony prominence. Redness to sacrum and groin (Active)  12/10/20 1600  Location: Buttocks  Location Orientation: Right;Bilateral  Staging: Stage 1 -  Intact skin with non-blanchable redness of a localized area usually over a bony prominence.  Wound Description (Comments): Redness to sacrum and groin  Present on Admission:      Pressure Injury 01/23/21 Heel Left Stage 1 -  Intact skin with non-blanchable redness of a localized area usually over a bony prominence. (Active)  01/23/21 2200  Location: Heel  Location Orientation: Left  Staging: Stage 1 -  Intact skin with non-blanchable redness of a localized area usually over a bony prominence.  Wound Description (Comments):   Present on Admission: Yes      DVT Prophylaxis: On rivaroxaban Code Status: DNR Family Communication: Discussed with  the patient Disposition Plan: Hopefully return to SNF for rehab when better.  Status is: Inpatient  Remains inpatient appropriate because:IV treatments appropriate due to intensity of illness or inability to take PO and Inpatient level of care appropriate due to severity of illness   Dispo:  Patient From: [facility]  Planned Disposition: [facility]  Medically stable for discharge: No          Medications:  Scheduled: . allopurinol  200 mg Oral QPM  . amiodarone  200 mg Oral Q12H   Followed by  . [START ON 02/02/2021] amiodarone  200 mg Oral Daily  . atorvastatin  20 mg Oral Daily  . Chlorhexidine Gluconate Cloth  6 each Topical Daily  . hydrocortisone sod succinate (SOLU-CORTEF) inj  100 mg Intravenous Q8H  . mouth rinse  15 mL Mouth Rinse BID  . mesalamine  250 mg Oral BID  . midodrine  5 mg Oral TID WC  . Rivaroxaban  15 mg Oral QPC supper  . sodium chloride flush  10-40 mL Intracatheter Q12H  . tamoxifen  20 mg Oral Daily  . thiamine  100 mg Oral Daily   Continuous: . sodium chloride 30 mL/hr at 01/26/21 0800  . amiodarone 30 mg/hr (01/26/21 0800)  . cefTRIAXone (ROCEPHIN)  IV Stopped (01/25/21 1833)  [id number] **OR** acetaminophen, HYDROcodone-acetaminophen, sodium chloride flush   Objective:  Vital Signs  Vitals:   01/26/21 0400 01/26/21 0500 01/26/21 0600 01/26/21 0800  BP: 116/67 130/66 119/71   Pulse: (!) 116 (!) 114 (!) 114   Resp: 16 15 14    Temp: 97.7 F (36.5 C)   97.7 F (36.5 C)  TempSrc: Axillary   Oral  SpO2: 100% 100% 100% 93%  Weight:      Height:        Intake/Output Summary (Last 24 hours) at 01/26/2021 0952 Last data filed at 01/26/2021 0800 Gross per 24 hour  Intake 1639.96 ml  Output 750 ml  Net 889.96 ml   Filed Weights   01/24/21 2115  Weight: 58.8 kg    General appearance: Awake alert.  In no distress Resp: Clear to auscultation bilaterally.  Normal effort Cardio: S1-S2 is irregularly  irregular tachycardic.  Telemetry shows atrial fibrillation with heart rate between 110 -120 GI: Abdomen is soft.  Nontender nondistended.  Bowel sounds are present normal.  No masses organomegaly Extremities: Edema noted bilateral lower extremities.  Right elbow swelling is stable. Neurologic: Alert and oriented x3.  No focal neurological deficits.     Lab Results:  Data Reviewed: I have personally reviewed following labs and imaging studies  CBC: Recent Labs  Lab 01/24/21 1610 01/25/21 0534 01/26/21 0749  WBC 13.8* 8.6 6.5  NEUTROABS 13.0* 7.9*  --   HGB 8.0* 7.3* 7.6*  HCT 25.8* 24.4* 25.1*  MCV 99.2 100.0 99.6  PLT 123* 107* 120*    Basic Metabolic Panel: Recent Labs  Lab 01/21/21 0442 01/24/21 1522 01/24/21 1857 01/24/21 1934 01/25/21 0534 01/26/21 0749  NA 138 139  --   --  140 139  K 4.4 4.4  --   --  3.4* 3.4*  CL 109 106  --   --  108 107  CO2 25 24  --   --  26 25  GLUCOSE 113* 117*  --   --  118* 124*  BUN 17 16  --   --  12 12  CREATININE 0.58 0.78  --   --  0.56 0.50  CALCIUM 8.2* 8.1*  --   --  7.8* 7.7*  MG  --   --   --  1.7 1.8 1.7  PHOS  --   --  3.2  --  3.2  --     GFR: Estimated Creatinine Clearance: 46.8 mL/min (by C-G formula based on SCr of 0.5 mg/dL).  Liver Function Tests: Recent Labs  Lab 01/24/21 1522 01/25/21 0534 01/26/21 0749  AST 80* 32 25  ALT 39 30 30  ALKPHOS 62 45 46  BILITOT 1.4* 0.9 1.0  PROT 5.2* 4.8* 4.9*  ALBUMIN 2.2* 2.3* 2.2*     Coagulation Profile: Recent Labs  Lab 01/24/21 1934  INR 1.5*    Cardiac Enzymes: Recent Labs  Lab 01/24/21 1934  CKTOTAL 10*     Thyroid Function Tests: Recent Labs    01/25/21 0534  TSH 0.690    Anemia Panel: Recent Labs    01/24/21 1934 01/25/21 0534  VITAMINB12  --  457  FOLATE  --  9.5  FERRITIN  --  146  TIBC  --  104*  IRON  --  54  RETICCTPCT 3.0  --     Recent Results (from the past 240 hour(s))  SARS CORONAVIRUS 2 (TAT 6-24 HRS)  Nasopharyngeal Nasopharyngeal Swab     Status: None  Collection Time: 01/17/21  4:10 AM   Specimen: Nasopharyngeal Swab  Result Value Ref Range Status   SARS Coronavirus 2 NEGATIVE NEGATIVE Final    Comment: (NOTE) SARS-CoV-2 target nucleic acids are NOT DETECTED.  The SARS-CoV-2 RNA is generally detectable in upper and lower respiratory specimens during the acute phase of infection. Negative results do not preclude SARS-CoV-2 infection, do not rule out co-infections with other pathogens, and should not be used as the sole basis for treatment or other patient management decisions. Negative results must be combined with clinical observations, patient history, and epidemiological information. The expected result is Negative.  Fact Sheet for Patients: SugarRoll.be  Fact Sheet for Healthcare Providers: https://www.woods-mathews.com/  This test is not yet approved or cleared by the [place] FDA and  has been authorized for detection and/or diagnosis of SARS-CoV-2 by FDA under an Emergency Use Authorization (EUA). This EUA will remain  in effect (meaning this test can be used) for the duration of the COVID-19 declaration under Se ction 564(b)(1) of the Act, 21 U.S.C. section 360bbb-3(b)(1), unless the authorization is terminated or revoked sooner.  Performed at [facility], [place] [address] [place], [place] [address]   MRSA PCR Screening     Status: Abnormal   Collection Time: 01/17/21  4:55 AM  Result Value Ref Range Status   MRSA by PCR POSITIVE (A) NEGATIVE Final    Comment:        The GeneXpert MRSA Assay (FDA approved for NASAL specimens only), is one component of a comprehensive MRSA colonization surveillance program. It is not intended to diagnose MRSA infection nor to guide or monitor treatment for MRSA infections. RESULT CALLED TO, READ BACK BY AND VERIFIED WITHKandis Mannan RN AT [phone] 01/17/21 MULLINS,T  Performed  at [facility], [place] [address], [place], [place] [address]   SARS CORONAVIRUS 2 (TAT 6-24 HRS) Nasopharyngeal Nasopharyngeal Swab     Status: None   Collection Time: 01/20/21  6:35 PM   Specimen: Nasopharyngeal Swab  Result Value Ref Range Status   SARS Coronavirus 2 NEGATIVE NEGATIVE Final    Comment: (NOTE) SARS-CoV-2 target nucleic acids are NOT DETECTED.  The SARS-CoV-2 RNA is generally detectable in upper and lower respiratory specimens during the acute phase of infection. Negative results do not preclude SARS-CoV-2 infection, do not rule out co-infections with other pathogens, and should not be used as the sole basis for treatment or other patient management decisions. Negative results must be combined with clinical observations, patient history, and epidemiological information. The expected result is Negative.  Fact Sheet for Patients: SugarRoll.be  Fact Sheet for Healthcare Providers: https://www.woods-mathews.com/  This test is not yet approved or cleared by the [place] FDA and  has been authorized for detection and/or diagnosis of SARS-CoV-2 by FDA under an Emergency Use Authorization (EUA). This EUA will remain  in effect (meaning this test can be used) for the duration of the COVID-19 declaration under Se ction 564(b)(1) of the Act, 21 U.S.C. section 360bbb-3(b)(1), unless the authorization is terminated or revoked sooner.  Performed at [facility], [place] [address] [place], [place] [address]   Blood culture (routine x 2)     Status: None (Preliminary result)   Collection Time: 01/24/21  3:13 PM   Specimen: BLOOD  Result Value Ref Range Status   Specimen Description   Final    BLOOD BLOOD LEFT ARM Performed at [facility] [address], [place],  [address]  Special Requests   Final    BOTTLES DRAWN AEROBIC AND ANAEROBIC Blood Culture adequate  volume Performed at [facility] [address], [place], [place] [address]    Culture   Final    NO GROWTH < 12 HOURS Performed at [facility] [address], [place], [place] [address]    Report Status PENDING  Incomplete  Blood culture (routine x 2)     Status: None (Preliminary result)   Collection Time: 01/24/21  3:22 PM   Specimen: BLOOD  Result Value Ref Range Status   Specimen Description   Final    BLOOD LEFT ARM Performed at [facility] [address], [place], [place] [address]    Special Requests   Final    BOTTLES DRAWN AEROBIC AND ANAEROBIC Blood Culture adequate volume Performed at [facility] [address] [place], [place] [address]    Culture   Final    NO GROWTH < 12 HOURS Performed at [facility] [address] [place], [place] [address]    Report Status PENDING  Incomplete  Resp Panel by RT-PCR (Flu A&B, Covid) Nasopharyngeal Swab     Status: None   Collection Time: 01/24/21  5:27 PM   Specimen: Nasopharyngeal Swab; Nasopharyngeal(NP) swabs in vial transport medium  Result Value Ref Range Status   SARS Coronavirus 2 by RT PCR NEGATIVE NEGATIVE Final    Comment: (NOTE) SARS-CoV-2 target nucleic acids are NOT DETECTED.  The SARS-CoV-2 RNA is generally detectable in upper respiratory specimens during the acute phase of infection. The lowest concentration of SARS-CoV-2 viral copies this assay can detect is 138 copies/mL. A negative result does not preclude SARS-Cov-2 infection and should not be used as the sole basis for treatment or other patient management decisions. A negative result may occur with  improper specimen collection/handling, submission of specimen other than nasopharyngeal swab, presence of viral mutation(s) within the areas targeted by this assay, and inadequate number of viral copies(<138 copies/mL). A negative result must be combined with clinical  observations, patient history, and epidemiological information. The expected result is Negative.  Fact Sheet for Patients:  EntrepreneurPulse.com.au  Fact Sheet for Healthcare Providers:  IncredibleEmployment.be  This test is no t yet approved or cleared by the [place] FDA and  has been authorized for detection and/or diagnosis of SARS-CoV-2 by FDA under an Emergency Use Authorization (EUA). This EUA will remain  in effect (meaning this test can be used) for the duration of the COVID-19 declaration under Section 564(b)(1) of the Act, 21 U.S.C.section 360bbb-3(b)(1), unless the authorization is terminated  or revoked sooner.       Influenza A by PCR NEGATIVE NEGATIVE Final   Influenza B by PCR NEGATIVE NEGATIVE Final    Comment: (NOTE) The Xpert Xpress SARS-CoV-2/FLU/RSV plus assay is intended as an aid in the diagnosis of influenza from Nasopharyngeal swab specimens and should not be used as a sole basis for treatment. Nasal washings and aspirates are unacceptable for Xpert Xpress SARS-CoV-2/FLU/RSV testing.  Fact Sheet for Patients: EntrepreneurPulse.com.au  Fact Sheet for Healthcare Providers: IncredibleEmployment.be  This test is not yet approved or cleared by the [place] FDA and has been authorized for detection and/or diagnosis of SARS-CoV-2 by FDA under an Emergency Use Authorization (EUA). This EUA will remain in effect (meaning this test can be used) for the duration of the COVID-19 declaration under Section 564(b)(1) of the Act, 21 U.S.C. section 360bbb-3(b)(1), unless the  authorization is terminated or revoked.  Performed at [facility], [place] [address], [place], [place] [address]       Radiology Studies: DG Elbow 2 Views Right  Result Date: 01/24/2021 CLINICAL DATA:  Provided history of: Pain on the olcer Patient reports pain about the ulnar aspect of right  elbow for 1 week. EXAM: RIGHT ELBOW - 2 VIEW COMPARISON:  None. FINDINGS: Frontal and lateral views. Normal alignment and joint spaces. No fracture, erosion, or bone destruction. There is a moderate elbow joint effusion. Soft tissue edema is most prominent about the posterior and ulnar aspect of the elbow. IMPRESSION: Moderate elbow joint effusion, nonspecific. Generalized soft tissue edema most prominent posterior medially. Electronically Signed   By: Keith Rake M.D.   On: 01/24/2021 17:26   DG Chest Port 1 View  Result Date: 01/24/2021 CLINICAL DATA:  Looking for infectious source. Recent UTI last week. Continued dysuria. Swelling and weeping of right upper arm. EXAM: PORTABLE CHEST 1 VIEW COMPARISON:  Chest x-ray 01/16/2021, CT chest 01/16/2021 FINDINGS: Left appendage clip, mitral annular valve replacement, as well as sternotomy wires are again noted. The heart size and mediastinal contours are within normal limits. Aortic arch calcifications. Biapical pleural/pulmonary scarring. No focal consolidation. Similar appearing coarsened interstitial markings. Persistent bilateral trace to small volume pleural effusions with streaky and patchy airspace opacities. No pneumothorax. No acute osseous abnormality. Mid to lower thoracic body kyphoplasty again noted. IMPRESSION: Persistent bilateral trace to small volume pleural effusions with underlying infection/inflammation not excluded. Electronically Signed   By: Iven Finn M.D.   On: 01/24/2021 15:55       LOS: 1 day   [facility] Pager on [email or web address]  01/26/2021, 9:52 AM

## 2021-01-26 NOTE — Evaluation (Signed)
Physical Therapy Evaluation Patient Details Name: Michele Green MRN: [id number] DOB: [birth year] Today's Date: 01/26/2021   History of Present Illness  Michele Green is a 80 y.o. female with medical history significant for atrial fibrillation on Xarelto, history of breast cancer, CAD, history  mitral valve repair, chronic pain, ulcerative colitis, and 2 L/min supplemental oxygen requirement, covid in 11/2020.[name] Patient re-presented to the emergency department3/5/22  for evaluation of shortness of breath and oozing RT elbow with RUE pain. Pt found to be in A-Fib with RVR.  Recently hospitalized on 2/25- 01/21/21 for shortness of breath and lightheadedness/ UTI . D/ C'd to [facility] ALF.[name]  Clinical Impression  The patient reports that she is tired and wants to nap. Patient  Required extensive assistance of 2 for bed mobilty, sitting up on bed edge and max pivot to recliner . Unable  To stand at Rw.  Patient HR 112-124 with mobility. SPO2 on 2 L  /RA > 92 %.   ALF will determine if patient can return  At +2 max assistance level.  Pt admitted with above diagnosis.   Pt currently with functional limitations due to the deficits listed below (see PT Problem List). Pt will benefit from skilled PT to increase their independence and safety with mobility to allow discharge to the venue listed below.       Follow Up Recommendations SNF (or [facility] if returns to ALF)    Equipment Recommendations  None recommended by PT    Recommendations for Other Services       Precautions / Restrictions Precautions Precautions: Fall Precaution Comments: Fragile skin Restrictions Weight Bearing Restrictions: No      Mobility  Bed Mobility Overal bed mobility: Needs Assistance Bed Mobility: Rolling;Sidelying to Sit;Sit to Sidelying Rolling: Max assist Sidelying to sit: +2 for physical assistance;+2 for safety/equipment;HOB elevated;Total assist       General bed mobility comments: Pt guarding with all  movements, presumably from body pains. Pt not initiating movements, stating, "you have to help me." Use of bed pad to assist upright    Transfers Overall transfer level: Needs assistance Equipment used: 2 person hand held assist Transfers: Sit to/from Stand Sit to Stand: Max assist;+2 physical assistance;+2 safety/equipment Stand pivot transfers: +2 physical assistance;Max assist;+2 safety/equipment       General transfer comment: Max assist to shift weight to bed edge, use of bed opad. Atempted x 1 to stand at [facility] with max assist to power up. Patient unable to power up.  Use of bed pad for pelvis support to stand and pivot to recliner with 2 arm hold assist.  Patient barely  placing  weight through legs  Ambulation/Gait                Stairs            Wheelchair Mobility    Modified Rankin (Stroke Patients Only)       Balance Overall balance assessment: Needs assistance Sitting-balance support: Feet supported;Bilateral upper extremity supported Sitting balance-Leahy Scale: Poor Sitting balance - Comments: Very weak trunk. Postural control: Posterior lean   Standing balance-Leahy Scale: Poor Standing balance comment: Reliant on external support of 2 people.                             Pertinent Vitals/Pain Pain Assessment: Faces Faces Pain Scale: Hurts even more Pain Location: "all over". Pt unable to localize. Pain Descriptors / Indicators: Sore;Aching Pain Intervention(s):  Monitored during session;Limited activity within patient's tolerance    Home Living Family/patient expects to be discharged to:: Assisted living     Type of Home: Assisted living Home Access: Level entry     Home Layout: One level Home Equipment: Walker - 2 wheels;Walker - 4 wheels;Wheelchair - manual;Shower seat;Grab bars - toilet Additional Comments: unsure if able to ambulate, patiwnt did not state    Prior Function Level of Independence: Independent with assistive  device(s) (Prior to January 2022.)         Comments: Pt stated in ST-SNF, then acknowledged [facility] care.Unsure of  PLOF upon DC from  [facility] recently.     Hand Dominance   Dominant Hand: Right    Extremity/Trunk Assessment   Upper Extremity Assessment Upper Extremity Assessment: Defer to OT evaluation    Lower Extremity Assessment Lower Extremity Assessment: Generalized weakness (multiple bruises and scabbed areas. on both legs)    Cervical / Trunk Assessment Cervical / Trunk Assessment: Kyphotic  Communication   Communication: No difficulties  Cognition Arousal/Alertness: Awake/alert Behavior During Therapy: Flat affect;Anxious Overall Cognitive Status: No family/caregiver present to determine baseline cognitive functioning                                 General Comments: Confused and anxious.  Depressed appearing mood. Needs encouragement, but cooperative within her tolerance.      General Comments      Exercises Other Exercises Other Exercises: Pt encouragated to move extremities in bed or recliner ad lib to increase strength and reduce body stiffness.   Assessment/Plan    PT Assessment Patient needs continued PT services  PT Problem List Decreased strength;Decreased activity tolerance;Decreased balance;Decreased mobility       PT Treatment Interventions Functional mobility training;Therapeutic activities;Therapeutic exercise;Patient/family education;DME instruction;Balance training;Cognitive remediation    PT Goals (Current goals can be found in the Care Plan section)  Acute Rehab PT Goals Patient Stated Goal: Pt only asking to nap. PT Goal Formulation: Patient unable to participate in goal setting Time For Goal Achievement: 02/09/21 Potential to Achieve Goals: Fair    Frequency Min 2X/week   Barriers to discharge        Co-evaluation PT/OT/SLP Co-Evaluation/Treatment: Yes Reason for Co-Treatment: For patient/therapist safety PT goals  addressed during session: Mobility/safety with mobility OT goals addressed during session: ADL's and self-care       AM-PAC PT "6 Clicks" Mobility  Outcome Measure Help needed turning from your back to your side while in a flat bed without using bedrails?: Total Help needed moving from lying on your back to sitting on the side of a flat bed without using bedrails?: Total Help needed moving to and from a bed to a chair (including a wheelchair)?: Total Help needed standing up from a chair using your arms (e.g., wheelchair or bedside chair)?: Total Help needed to walk in hospital room?: Total Help needed climbing 3-5 steps with a railing? : Total 6 Click Score: 6    End of Session Equipment Utilized During Treatment: Gait belt Activity Tolerance: Patient limited by fatigue Patient left: in chair;with call bell/phone within reach Nurse Communication: Mobility status;Need for lift equipment PT Visit Diagnosis: Muscle weakness (generalized) (M62.81)    Time: 7948-0165 PT Time Calculation (min) (ACUTE ONLY): 23 min   Charges:   PT Evaluation $PT Eval Moderate Complexity: [place]  Pager [phone] Office [phone]   Claretha Cooper 01/26/2021, 11:09 AM

## 2021-01-26 NOTE — Progress Notes (Signed)
Progress Note  Patient Name: Michele Green Date of Encounter: 01/26/2021  [facility] Cardiologist: Sinclair Grooms, MD   Subjective   Feeling tired. No palpitations.  Breathing has improved.    Inpatient Medications    Scheduled Meds: . allopurinol  200 mg Oral QPM  . amiodarone  200 mg Oral Q12H   Followed by  . [START ON 02/02/2021] amiodarone  200 mg Oral Daily  . atorvastatin  20 mg Oral Daily  . Chlorhexidine Gluconate Cloth  6 each Topical Daily  . hydrocortisone sod succinate (SOLU-CORTEF) inj  50 mg Intravenous Q8H  . mouth rinse  15 mL Mouth Rinse BID  . mesalamine  250 mg Oral BID  . midodrine  2.5 mg Oral TID WC  . potassium chloride  40 mEq Oral BID  . Rivaroxaban  15 mg Oral QPC supper  . sodium chloride flush  10-40 mL Intracatheter Q12H  . tamoxifen  20 mg Oral Daily  . thiamine  100 mg Oral Daily   Continuous Infusions: . amiodarone 30 mg/hr (01/26/21 0800)  . cefTRIAXone (ROCEPHIN)  IV Stopped (01/25/21 1833)  . magnesium sulfate bolus IVPB     PRN Meds: acetaminophen **OR** acetaminophen, HYDROcodone-acetaminophen, sodium chloride flush   Vital Signs    Vitals:   01/26/21 0400 01/26/21 0500 01/26/21 0600 01/26/21 0800  BP: 116/67 130/66 119/71   Pulse: (!) 116 (!) 114 (!) 114   Resp: 16 15 14    Temp: 97.7 F (36.5 C)   97.7 F (36.5 C)  TempSrc: Axillary   Oral  SpO2: 100% 100% 100% 93%  Weight:      Height:        Intake/Output Summary (Last 24 hours) at 01/26/2021 1035 Last data filed at 01/26/2021 0800 Gross per 24 hour  Intake 1639.96 ml  Output 750 ml  Net 889.96 ml   Last 3 Weights 01/24/2021 01/20/2021 01/19/2021  Weight (lbs) 129 lb 10.1 oz 134 lb 14.7 oz 132 lb 4.4 oz  Weight (kg) 58.8 kg 61.2 kg 60 kg      Telemetry    Atrial tachycardia.  Rate 115 bpm. - Personally Reviewed  ECG    n/a - Personally Reviewed  Physical Exam   VS:  BP 119/71   Pulse (!) 114   Temp 97.7 F (36.5 C) (Oral)   Resp 14   Ht 5' 4"   (1.626 m)   Wt 58.8 kg   SpO2 93%   BMI 22.25 kg/m  , BMI Body mass index is 22.25 kg/m. GENERAL:  Well appearing HEENT: Pupils equal round and reactive, fundi not visualized, oral mucosa unremarkable NECK:  No jugular venous distention, waveform within normal limits, carotid upstroke brisk and symmetric, no bruits LUNGS:  Clear to auscultation bilaterally HEART:  RRR.  PMI not displaced or sustained,S1 and S2 within normal limits, no S3, no S4, no clicks, no rubs, no murmurs ABD:  Flat, positive bowel sounds normal in frequency in pitch, no bruits, no rebound, no guarding, no midline pulsatile mass, no hepatomegaly, no splenomegaly EXT:  2 plus pulses throughout, no edema, no cyanosis no clubbing SKIN:  No rashes no nodules NEURO:  Cranial nerves II through XII grossly intact, motor grossly intact throughout PSYCH:  Cognitively intact, oriented to person place and time  Labs    High Sensitivity Troponin:   Recent Labs  Lab 01/16/21 1909 01/17/21 0524 01/24/21 1522 01/24/21 1707  TROPONINIHS 31* 30* 25* 24*  Chemistry Recent Labs  Lab 01/24/21 1522 01/25/21 0534 01/26/21 0749  NA 139 140 139  K 4.4 3.4* 3.4*  CL 106 108 107  CO2 24 26 25   GLUCOSE 117* 118* 124*  BUN 16 12 12   CREATININE 0.78 0.56 0.50  CALCIUM 8.1* 7.8* 7.7*  PROT 5.2* 4.8* 4.9*  ALBUMIN 2.2* 2.3* 2.2*  AST 80* 32 25  ALT 39 30 30  ALKPHOS 62 45 46  BILITOT 1.4* 0.9 1.0  GFRNONAA >60 >60 >60  ANIONGAP 9 6 7      Hematology Recent Labs  Lab 01/24/21 1610 01/24/21 1934 01/25/21 0534 01/26/21 0749  WBC 13.8*  --  8.6 6.5  RBC 2.60* 2.47* 2.44* 2.52*  HGB 8.0*  --  7.3* 7.6*  HCT 25.8*  --  24.4* 25.1*  MCV 99.2  --  100.0 99.6  MCH 30.8  --  29.9 30.2  MCHC 31.0  --  29.9* 30.3  RDW 15.9*  --  15.7* 15.5  PLT 123*  --  107* 120*    BNP Recent Labs  Lab 01/24/21 1610  BNP 478.7*     DDimer No results for input(s): DDIMER in the last 168 hours.   Radiology    DG Elbow 2  Views Right  Result Date: 01/24/2021 CLINICAL DATA:  Provided history of: Pain on the olcer Patient reports pain about the ulnar aspect of right elbow for 1 week. EXAM: RIGHT ELBOW - 2 VIEW COMPARISON:  None. FINDINGS: Frontal and lateral views. Normal alignment and joint spaces. No fracture, erosion, or bone destruction. There is a moderate elbow joint effusion. Soft tissue edema is most prominent about the posterior and ulnar aspect of the elbow. IMPRESSION: Moderate elbow joint effusion, nonspecific. Generalized soft tissue edema most prominent posterior medially. Electronically Signed   By: Keith Rake M.D.   On: 01/24/2021 17:26   DG Chest Port 1 View  Result Date: 01/24/2021 CLINICAL DATA:  Looking for infectious source. Recent UTI last week. Continued dysuria. Swelling and weeping of right upper arm. EXAM: PORTABLE CHEST 1 VIEW COMPARISON:  Chest x-ray 01/16/2021, CT chest 01/16/2021 FINDINGS: Left appendage clip, mitral annular valve replacement, as well as sternotomy wires are again noted. The heart size and mediastinal contours are within normal limits. Aortic arch calcifications. Biapical pleural/pulmonary scarring. No focal consolidation. Similar appearing coarsened interstitial markings. Persistent bilateral trace to small volume pleural effusions with streaky and patchy airspace opacities. No pneumothorax. No acute osseous abnormality. Mid to lower thoracic body kyphoplasty again noted. IMPRESSION: Persistent bilateral trace to small volume pleural effusions with underlying infection/inflammation not excluded. Electronically Signed   By: Iven Finn M.D.   On: 01/24/2021 15:55    Cardiac Studies   Echo 01/17/21:  1. s/p Mitral Valve Repair. Procedure Date: 08/10/2018.  2. Right ventricular systolic function is normal. The right ventricular  size is normal. There is moderately elevated pulmonary artery systolic  pressure. The estimated right ventricular systolic pressure is 54.2  mmHg.  3. Left atrial size was severely dilated.  4. The aortic valve is tricuspid. Aortic valve regurgitation is not  visualized. No aortic stenosis is present.  5. Tricuspid valve regurgitation is moderate.  6. Left ventricular ejection fraction, by estimation, is 60 to 65%. The  left ventricle has normal function. The left ventricle has no regional  wall motion abnormalities. Left ventricular diastolic parameters are  indeterminate.  7. The inferior vena cava is normal in size with <50% respiratory  variability, suggesting right atrial  pressure of 8 mmHg.   Patient Profile     80 y.o. female with CAD status post CABG, mitral vegetation status post repair, atrial fibrillation status post maze, recurrent atrial fibrillation, hypertension, hyperlipidemia, and chronic hypoxic respiratory failure on home O2 admitted with acute on chronic hypoxia and found to be in atrial fibrillation with RVR.  Assessment & Plan    # Persistent atrial fibrillation: # atrial tachycardia: Ms. Gottsch has struggled to maintain sinus rhythm.  She has undergone a MAZE procedure and was back in atrial fibrillation this admission.  Currently appears she is in atrial tachycardia.  P waves are present and if she is consistently at a rate of about 150 bpm.  She is currently loading on amiodarone.  Blood pressure has stabilized off diltiazem.  Continue current regimen and continue amiodarone loading for now.  If we need to, can add digoxin for rate control, which is reportedly worked well in the past.  It was previously discontinued to elevated levels, though she had no symptoms.  Continue Xarelto.  # CAD s/p CABG:  Not an active issue.  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      For questions or updates, please contact [place] Please consult [email or web address] for contact info under        Signed, Skeet Latch, MD  01/26/2021, 10:35 AM

## 2021-01-26 NOTE — TOC Initial Note (Signed)
Transition of Care [facility]) - Initial/Assessment Note    Patient Details  Name: Michele Green MRN: [id number] Date of Birth: 1941-11-21  [facility] CM/SW Contact:    Leeroy Cha, RN Phone Number: 01/26/2021, 9:09 AM  Clinical Narrative:                 80 y.o. 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  Leg edema. Patient has been recently treated for UTI last week but continued to have some dysuria. Also states she has a wound of her left foot that is been going on for past few months appears to be a pressure ulcer she feels like she has multiple skin tears as she has generalized thinning of the skin secondary to be used chronic steroids. Denies any abdominal point pain no nausea no vomiting.  No flank pain.  No fevers or chills. Reports that her stools have been regular no constipation or diarrhea.  No fevers or chills.  Recently admitted for adrenal insufficiency with hypotension her random cortisol at the time of prior admission was 11.7 she received stress dose steroids and midodrine which has improved her hypotension. Echogram was done that showed preserved EF 60-65% and right ventricular function also preserved. There was some evidence of pulmonary hypertension Prior to discharge they were able to wean off of midodrine Discharged on slow taper prednisone Her metoprolol was increased to 75 mg twice daily and her digoxin was stopped.  Patient has underlying paroxysmal atrial fibrillation with second-degree AV block and sinus tachycardia as well.  She has been taking for this metoprolol diltiazem and digoxin  When she was able to resume her diltiazem and metoprolol were held digoxin On chronic anticoagulation  rivaroxaban  She is onInfectious risk factors:  Reports shortness of breath     Has been vaccinated against COVID and boosted  PLAN: to return to [facility] her Assisted living facilty Expected Discharge Plan: Assisted Living ([facility]) Barriers to Discharge: Continued Medical Work up   Patient Goals and CMS Choice Patient states their goals for this hospitalization and ongoing recovery are:: per son to return back to alf      Expected Discharge Plan and Services Expected Discharge Plan: Assisted Living ([facility])   Discharge Planning Services: CM Consult   Living arrangements for the past 2 months: [place]                                      Prior Living Arrangements/Services Living arrangements for the past 2 months: [place] Lives with:: Facility Resident Patient language and need for interpreter reviewed:: Yes Do you feel safe going back to the place where you live?: Yes      Need for Family Participation in Patient Care: No (Comment) Care giver support system in place?: No (comment)   Criminal Activity/Legal Involvement Pertinent to Current Situation/Hospitalization: No - Comment as needed  Activities of Daily Living Home Assistive Devices/Equipment: None ADL Screening (condition at time of admission) Patient's cognitive ability adequate to safely complete daily activities?: Yes Is the patient deaf or have difficulty hearing?: No Does the patient have difficulty seeing, even when wearing glasses/contacts?: No Does the patient have difficulty  concentrating, remembering, or making decisions?: No Patient able to express need for assistance with ADLs?: No Does the patient have difficulty dressing or bathing?: Yes Independently performs ADLs?: Yes (appropriate for developmental age) Does the patient have difficulty walking or climbing stairs?: Yes Weakness of Legs: Both Weakness of Arms/Hands: Both  Permission  Sought/Granted                  Emotional Assessment Appearance:: Appears stated age Attitude/Demeanor/Rapport: Engaged Affect (typically observed): Accepting Orientation: : Oriented to Place,Oriented to Self,Oriented to  Time,Oriented to Situation Alcohol / Substance Use: Not Applicable Psych Involvement: No (comment)  Admission diagnosis:  Adrenal insufficiency (Addison's disease) ([place]) [E27.1] Lymphedema [I89.0] SIRS (systemic inflammatory response syndrome) (H[facility]) [R65.10] Atrial fibrillation with RVR ([place]) [I48.91] Patient Active Problem List   Diagnosis Date Noted  . Adrenal insufficiency (Addison's disease) ([place]) 01/24/2021  . Anemia of chronic disease 01/24/2021  . 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 Hypotension 01/17/2021  . Elevated troponin 01/17/2021  . Debility 01/17/2021  . Chronic pain 01/17/2021  . Ulcerative colitis ([place]) 01/17/2021  . Chronic respiratory failure with hypoxia ([place]) 12/03/2020  . Pressure injury of skin 12/02/2020  . Pneumonia due to COVID-19 virus 12/01/2020  . T8 vertebral fracture ([place]) 09/13/2020  . Compression fracture of C-spine ([place]) 09/12/2020  . Compression fracture of thoracic spine, non-traumatic ([place]) 09/12/2020  . Goals of care, counseling/discussion   . Palliative care by specialist   . Spinal fracture of T7 vertebra ([place]) 08/17/2020  . Atrial fibrillation with RVR ([place]) 08/16/2020  . Long term (current) use of anticoagulants 08/28/2018  . Atrial flutter with rapid ventricular response ([place]) 08/14/2018  . 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 S/P Maze operation for atrial fibrillation 08/10/2018  . Chronic diastolic HF (heart failure) ([place]) 06/20/2018  . Osteoporosis 09/29/2017  . Loosening of hardware in spine ([place]) 08/23/2017  . Spinal stenosis of lumbar region 08/11/2016  . Unspecified cord compression ([place]) 08/11/2016  . Unstable burst fracture of third lumbar vertebra ([place])  08/11/2016  . Genetic testing 03/17/2016  . Family history of breast cancer   . Breast cancer of upper-outer quadrant of left female breast ([place]) 02/02/2016  . On amiodarone therapy 09/09/2015  . On continuous oral anticoagulation 08/22/2015  . Persistent atrial fibrillation ([place]) 08/22/2015  . Essential hypertension 11/07/2013  . Mitral regurgitation 11/07/2013   PCP:  [name], MD Pharmacy:   [id number] [place], [id number]: [phone]: [phone]    Social Determinants of Health (SDOH) Interventions    Readmission Risk Interventions Readmission Risk Prevention Plan 01/19/2021  Medication Review ([place] Complete  PCP or Specialist appointment within 3-5 days of discharge Complete  HRI or Home Care Consult Complete  SW Recovery Care/Counseling Consult Complete  Palliative Care Screening Complete  Skilled Nursing Facility Complete  Some recent data might be hidden

## 2021-01-27 DIAGNOSIS — N390 Urinary tract infection, site not specified: Secondary | ICD-10-CM

## 2021-01-27 DIAGNOSIS — I4891 Unspecified atrial fibrillation: Secondary | ICD-10-CM | POA: Diagnosis not present

## 2021-01-27 DIAGNOSIS — D638 Anemia in other chronic diseases classified elsewhere: Secondary | ICD-10-CM | POA: Diagnosis not present

## 2021-01-27 DIAGNOSIS — E271 Primary adrenocortical insufficiency: Secondary | ICD-10-CM | POA: Diagnosis not present

## 2021-01-27 DIAGNOSIS — K5190 Ulcerative colitis, unspecified, without complications: Secondary | ICD-10-CM | POA: Diagnosis not present

## 2021-01-27 LAB — COMPREHENSIVE METABOLIC PANEL
ALT: 37 U/L (ref 0–44)
AST: 35 U/L (ref 15–41)
Albumin: 2.2 g/dL — ABNORMAL LOW (ref 3.5–5.0)
Alkaline Phosphatase: 47 U/L (ref 38–126)
Anion gap: 4 — ABNORMAL LOW (ref 5–15)
BUN: 12 mg/dL (ref 8–23)
CO2: 25 mmol/L (ref 22–32)
Calcium: 7.8 mg/dL — ABNORMAL LOW (ref 8.9–10.3)
Chloride: 110 mmol/L (ref 98–111)
Creatinine, Ser: 0.56 mg/dL (ref 0.44–1.00)
GFR, Estimated: >60 mL/min (ref >60)
Glucose, Bld: 131 mg/dL — ABNORMAL HIGH (ref 70–99)
Potassium: 3.9 mmol/L (ref 3.5–5.1)
Sodium: 139 mmol/L (ref 135–145)
Total Bilirubin: 0.6 mg/dL (ref 0.3–1.2)
Total Protein: 4.8 g/dL — ABNORMAL LOW (ref 6.5–8.1)

## 2021-01-27 LAB — PROCALCITONIN: Procalcitonin: 2.58 ng/mL

## 2021-01-27 LAB — CBC
HCT: 23.1 % — ABNORMAL LOW (ref 36.0–46.0)
Hemoglobin: 7.2 g/dL — ABNORMAL LOW (ref 12.0–15.0)
MCH: 30.6 pg (ref 26.0–34.0)
MCHC: 31.2 g/dL (ref 30.0–36.0)
MCV: 98.3 fL (ref 80.0–100.0)
Platelets: 132 10*3/uL — ABNORMAL LOW (ref 150–400)
RBC: 2.35 MIL/uL — ABNORMAL LOW (ref 3.87–5.11)
RDW: 15.8 % — ABNORMAL HIGH (ref 11.5–15.5)
WBC: 10.1 10*3/uL (ref 4.0–10.5)
nRBC: 0.2 % (ref 0.0–0.2)

## 2021-01-27 LAB — PREPARE RBC (CROSSMATCH)

## 2021-01-27 LAB — DIGOXIN LEVEL: Digoxin Level: 5.5 ng/mL (ref 0.8–2.0)

## 2021-01-27 LAB — MAGNESIUM: Magnesium: 2 mg/dL (ref 1.7–2.4)

## 2021-01-27 MED ORDER — ALPRAZOLAM 0.25 MG PO TABS
0.2500 mg | ORAL_TABLET | Freq: Two times a day (BID) | ORAL | Status: DC | PRN
  Administered 2021-01-27 – 2021-01-28 (×4): 0.25 mg via ORAL
  Filled 2021-01-27 (×5): qty 1

## 2021-01-27 MED ORDER — HYDROCORTISONE NA SUCCINATE PF 100 MG IJ SOLR
25.0000 mg | Freq: Three times a day (TID) | INTRAMUSCULAR | Status: DC
  Administered 2021-01-27 – 2021-01-30 (×8): 25 mg via INTRAVENOUS
  Filled 2021-01-27 (×8): qty 2

## 2021-01-27 MED ORDER — LOPERAMIDE HCL 2 MG PO CAPS
4.0000 mg | ORAL_CAPSULE | Freq: Three times a day (TID) | ORAL | Status: DC | PRN
  Administered 2021-01-30 – 2021-02-01 (×3): 4 mg via ORAL
  Filled 2021-01-27 (×3): qty 2

## 2021-01-27 MED ORDER — DIGOXIN 125 MCG PO TABS: 0.0625 mg | ORAL_TABLET | Freq: Every day | ORAL | Status: DC

## 2021-01-27 MED ORDER — SACCHAROMYCES BOULARDII 250 MG PO CAPS
250.0000 mg | ORAL_CAPSULE | Freq: Two times a day (BID) | ORAL | Status: DC
  Administered 2021-01-27 – 2021-02-02 (×14): 250 mg via ORAL
  Filled 2021-01-27 (×15): qty 1

## 2021-01-27 MED ORDER — DILTIAZEM HCL ER COATED BEADS 120 MG PO CP24
120.0000 mg | ORAL_CAPSULE | Freq: Every day | ORAL | Status: DC
  Administered 2021-01-27: 120 mg via ORAL
  Filled 2021-01-27: qty 1

## 2021-01-27 MED ORDER — POTASSIUM CHLORIDE CRYS ER 20 MEQ PO TBCR
40.0000 meq | EXTENDED_RELEASE_TABLET | Freq: Once | ORAL | Status: AC
  Administered 2021-01-27: 40 meq via ORAL
  Filled 2021-01-27: qty 2

## 2021-01-27 MED ORDER — SODIUM CHLORIDE 0.9% IV SOLUTION: Freq: Once | INTRAVENOUS | Status: AC

## 2021-01-27 MED ORDER — DIGOXIN 0.25 MG/ML IJ SOLN
0.1250 mg | Freq: Four times a day (QID) | INTRAMUSCULAR | Status: DC
  Administered 2021-01-27: 0.125 mg via INTRAVENOUS
  Filled 2021-01-27: qty 2

## 2021-01-27 MED ORDER — FUROSEMIDE 10 MG/ML IJ SOLN
20.0000 mg | Freq: Once | INTRAMUSCULAR | Status: AC
  Administered 2021-01-27: 20 mg via INTRAVENOUS
  Filled 2021-01-27: qty 2

## 2021-01-27 MED ORDER — MIDODRINE HCL 5 MG PO TABS
2.5000 mg | ORAL_TABLET | Freq: Two times a day (BID) | ORAL | Status: DC
  Administered 2021-01-27 – 2021-01-28 (×3): 2.5 mg via ORAL
  Filled 2021-01-27 (×4): qty 1

## 2021-01-27 MED ORDER — LOPERAMIDE HCL 2 MG PO CAPS
4.0000 mg | ORAL_CAPSULE | Freq: Once | ORAL | Status: AC
  Administered 2021-01-27: 4 mg via ORAL
  Filled 2021-01-27: qty 2

## 2021-01-27 NOTE — Progress Notes (Signed)
[facility] PROGRESS NOTE   Michele Green WRU:[id number] DOB: 10-21-1941 DOA: 01/24/2021  PCP: [name], MD  Brief History/Interval Summary: 80 y.o. 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  She went to a skilled nursing facility for short-term rehab.  Came back from there overnight due to feeling poorly with low oxygen levels.  She was found to be in atrial fibrillation with RVR.  She was noted to be hypotensive.  She was hospitalized for further management.  Consultants: Cardiology.  Orthopedics  Procedures: None yet  Antibiotics: Anti-infectives (From admission, onward)   Start     Dose/Rate Route Frequency Ordered Stop   01/25/21 1700  cefTRIAXone (ROCEPHIN) 1 g in sodium chloride 0.9 % 100 mL IVPB        1 g 200 mL/hr over 30 Minutes Intravenous Every 24 hours 01/24/21 2105     01/24/21 1745  cefTRIAXone (ROCEPHIN) 1 g in sodium chloride 0.9 % 100 mL IVPB        1 g 200 mL/hr over 30 Minutes Intravenous  Once 01/24/21 1734 01/24/21 1808      Subjective/Interval History: Patient complains of feeling anxious this morning.  Also reported multiple loose stools overnight.  Denies any chest pain or shortness of breath.  Complains of feeling fatigued.      Assessment/Plan:  Atrial fibrillation with RVR Patient has previously been on metoprolol diltiazem and digoxin.  However she was found to have elevated digoxin level during her previous hospitalization so she was not discharged on it.   Admitted again with atrial fibrillation with RVR.  Was initially on diltiazem infusion.  Low blood pressure was a challenge.  Patient requiring midodrine and also  on stress dose steroids.  Blood pressures have stabilized some.  Cardiology was consulted who transitioned the patient to amiodarone infusion.   She is already anticoagulated with rivaroxaban.   Defer management to cardiology at this time.   Patient is status post mitral valve repair and maze procedure previously.  Hopefully will be able to get her off of the midodrine as her blood pressure stabilizes.  Adrenal insufficiency with hypotension Patient currently on stress dose steroids.  Apparently has been on chronic steroids in the past for unknown reasons.   Blood pressure has stabilized.  Hydrocortisone being slowly tapered down.  Midodrine dose also being reduced gradually.  Anxiety Appears to be situational.  Low-dose alprazolam as needed.  Loose stools Abdomen is benign.  Could be related to medications.  Patient does have a history of ulcerative colitis.  Imodium as needed.  Per patient no blood noted in the stool.  Right elbow pain and swelling/for joint infection/history of gout Patient noted to be on allopurinol.  Uric acid level was only 3.7.  Joint effusion was noted on imaging studies.  WBC was noted to be elevated.  Seen by orthopedics.  Appreciate their input.  Conservative management is recommended at this time.  No need for arthrocentesis per orthopedics.    History of ulcerative colitis Could be the reason why she has required steroids previously.  Loose stools as discussed above.  Noted to be on mesalamine which is being continued.  Mildly elevated troponin/chronic diastolic CHF/history of mitral valve repair/maze procedure Elevated troponin likely due  to tachycardia.  Patient does not have any anginal symptoms currently.  Last echocardiogram was in February 2022 which showed normal left ventricular systolic function without any wall motion abnormalities.  Right ventricular systolic pressure was noted to be elevated.   Concern for recurrent UTI Patient continued on  ceftriaxone due to persistent dysuria.  Urine culture with multiple species.  Patient continues to have dysuria although it is better than before.  Procalcitonin levels have been improving.  We will plan for a 7-day course.    History of breast cancer Stable.  Tamoxifen being continued.  Anemia of chronic disease/thrombocytopenia Drop in hemoglobin appears to be dilutional.  No evidence of overt bleeding.  Patient does have profound fatigue which could be due to her anemia.  We will give her 1 unit of PRBC.  Patient is agreeable.  Anemia panel reviewed.  No clear-cut deficiencies identified.  Functional quadriplegia PT and OT will be involved.  Dyslipidemia Continue statin.  Hypokalemia Potassium corrected.  Magnesium is 2.0.    Pressure injury Wound care nurse is following Pressure Injury 12/02/20 Anus Medial Stage 1 -  Intact skin with non-blanchable redness of a localized area usually over a bony prominence. (Active)  12/02/20 0030  Location: Anus  Location Orientation: Medial  Staging: Stage 1 -  Intact skin with non-blanchable redness of a localized area usually over a bony prominence.  Wound Description (Comments):   Present on Admission: Yes     Pressure Injury 12/10/20 Buttocks Right;Bilateral Stage 1 -  Intact skin with non-blanchable redness of a localized area usually over a bony prominence. Redness to sacrum and groin (Active)  12/10/20 1600  Location: Buttocks  Location Orientation: Right;Bilateral  Staging: Stage 1 -  Intact skin with non-blanchable redness of a localized area usually over a bony prominence.  Wound Description (Comments): Redness to sacrum and groin  Present on Admission:      Pressure Injury 01/23/21 Heel Left Stage 1 -  Intact skin with non-blanchable redness of a localized area usually over a bony prominence. (Active)  01/23/21 2200  Location: Heel  Location Orientation: Left  Staging: Stage 1 -  Intact skin with non-blanchable redness of a  localized area usually over a bony prominence.  Wound Description (Comments):   Present on Admission: Yes      DVT Prophylaxis: On rivaroxaban Code Status: DNR Family Communication: Discussed with the patient Disposition Plan: Hopefully return to SNF for rehab when better.  Status is: Inpatient  Remains inpatient appropriate because:IV treatments appropriate due to intensity of illness or inability to take PO and Inpatient level of care appropriate due to severity of illness   Dispo:  Patient From: [facility]  Planned Disposition: [facility]  Medically stable for discharge: No          Medications:  Scheduled: . allopurinol  200 mg Oral QPM  . amiodarone  200 mg Oral Q12H   Followed by  . [START ON 02/02/2021] amiodarone  200 mg Oral Daily  . atorvastatin  20 mg Oral Daily  . Chlorhexidine Gluconate Cloth  6 each Topical Daily  . feeding supplement  1 Container Oral BID BM  . hydrocortisone sod succinate (SOLU-CORTEF) inj  50 mg Intravenous Q8H  . mouth rinse  15 mL Mouth Rinse BID  . mesalamine  250 mg Oral BID  . midodrine  2.5 mg Oral TID WC  . multivitamin with minerals  1 tablet Oral Daily  . Rivaroxaban  15 mg Oral  QPC supper  . saccharomyces boulardii  250 mg Oral BID  . sodium chloride flush  10-40 mL Intracatheter Q12H  . tamoxifen  20 mg Oral Daily  . thiamine  100 mg Oral Daily   Continuous: . amiodarone 30 mg/hr (01/27/21 0252)  . cefTRIAXone (ROCEPHIN)  IV Stopped (01/26/21 1740)   LZJ:QBHALPFXTKWIO **OR** acetaminophen, ALPRAZolam, HYDROcodone-acetaminophen, lip balm, loperamide, sodium chloride flush   Objective:  Vital Signs  Vitals:   01/26/21 1519 01/26/21 1713 01/26/21 1930 01/26/21 2059  BP: 109/61 112/78  122/71  Pulse: (!) 119 (!) 115  (!) 120  Resp: 20   20  Temp: 97.7 F (36.5 C) 97.7 F (36.5 C)  97.7 F (36.5 C)  TempSrc:  Oral    SpO2: 95%  96% 100%  Weight:      Height:         Intake/Output Summary (Last 24 hours) at 01/27/2021 1046 Last data filed at 01/27/2021 0825 Gross per 24 hour  Intake 751.13 ml  Output -  Net 751.13 ml   Filed Weights   01/24/21 2115  Weight: 58.8 kg    General appearance: Awake alert.  In no distress Resp: Clear to auscultation bilaterally.  Normal effort Cardio: S1-S2 is tachycardic regular.   GI: Abdomen is soft.  Nontender nondistended.  Bowel sounds are present normal.  No masses organomegaly Extremities: Mild edema bilateral lower extremities.  Physical deconditioning noted. Neurologic: Alert and oriented x3.  No focal neurological deficits.      Lab Results:  Data Reviewed: I have personally reviewed following labs and imaging studies  CBC: Recent Labs  Lab 01/24/21 1610 01/25/21 0534 01/26/21 0749 01/27/21 0458  WBC 13.8* 8.6 6.5 10.1  NEUTROABS 13.0* 7.9*  --   --   HGB 8.0* 7.3* 7.6* 7.2*  HCT 25.8* 24.4* 25.1* 23.1*  MCV 99.2 100.0 99.6 98.3  PLT 123* 107* 120* 132*    Basic Metabolic Panel: Recent Labs  Lab 01/21/21 0442 01/24/21 1522 01/24/21 1857 01/24/21 1934 01/25/21 0534 01/26/21 0749 01/27/21 0458  NA 138 139  --   --  140 139 139  K 4.4 4.4  --   --  3.4* 3.4* 3.9  CL 109 106  --   --  108 107 110  CO2 25 24  --   --  26 25 25   GLUCOSE 113* 117*  --   --  118* 124* 131*  BUN 17 16  --   --  12 12 12   CREATININE 0.58 0.78  --   --  0.56 0.50 0.56  CALCIUM 8.2* 8.1*  --   --  7.8* 7.7* 7.8*  MG  --   --   --  1.7 1.8 1.7 2.0  PHOS  --   --  3.2  --  3.2  --   --     GFR: Estimated Creatinine Clearance: 46.8 mL/min (by C-G formula based on SCr of 0.56 mg/dL).  Liver Function Tests: Recent Labs  Lab 01/24/21 1522 01/25/21 0534 01/26/21 0749 01/27/21 0458  AST 80* 32 25 35  ALT 39 30 30 37  ALKPHOS 62 45 46 47  BILITOT 1.4* 0.9 1.0 0.6  PROT 5.2* 4.8* 4.9* 4.8*  ALBUMIN 2.2* 2.3* 2.2* 2.2*     Coagulation Profile: Recent Labs  Lab 01/24/21 1934  INR 1.5*     Cardiac Enzymes: Recent Labs  Lab 01/24/21 1934  CKTOTAL 10*     Thyroid Function Tests: Recent Labs  01/25/21 0534  TSH 0.690    Anemia Panel: Recent Labs    01/24/21 1934 01/25/21 0534  VITAMINB12  --  457  FOLATE  --  9.5  FERRITIN  --  146  TIBC  --  104*  IRON  --  54  RETICCTPCT 3.0  --     Recent Results (from the past 240 hour(s))  SARS CORONAVIRUS 2 (TAT 6-24 HRS) Nasopharyngeal Nasopharyngeal Swab     Status: None   Collection Time: 01/20/21  6:35 PM   Specimen: Nasopharyngeal Swab  Result Value Ref Range Status   SARS Coronavirus 2 NEGATIVE NEGATIVE Final    Comment: (NOTE) SARS-CoV-2 target nucleic acids are NOT DETECTED.  The SARS-CoV-2 RNA is generally detectable in upper and lower respiratory specimens during the acute phase of infection. Negative results do not preclude SARS-CoV-2 infection, do not rule out co-infections with other pathogens, and should not be used as the sole basis for treatment or other patient management decisions. Negative results must be combined with clinical observations, patient history, and epidemiological information. The expected result is Negative.  Fact Sheet for Patients: SugarRoll.be  Fact Sheet for Healthcare Providers: https://www.woods-mathews.com/  This test is not yet approved or cleared by the [place] FDA and  has been authorized for detection and/or diagnosis of SARS-CoV-2 by FDA under an Emergency Use Authorization (EUA). This EUA will remain  in effect (meaning this test can be used) for the duration of the COVID-19 declaration under Se ction 564(b)(1) of the Act, 21 U.S.C. section 360bbb-3(b)(1), unless the authorization is terminated or revoked sooner.  Performed at [facility], [place] [address] [place], [place] [address]   Blood culture (routine x 2)     Status: None (Preliminary result)   Collection Time: 01/24/21  3:13 PM    Specimen: BLOOD  Result Value Ref Range Status   Specimen Description   Final    BLOOD BLOOD LEFT ARM Performed at [facility] [address], [place], [place] [address]    Special Requests   Final    BOTTLES DRAWN AEROBIC AND ANAEROBIC Blood Culture adequate volume Performed at [facility] [address] [place], [place] [address]    Culture   Final    NO GROWTH 1 DAY Performed at [facility], [place] [address] [place], [place] [address]    Report Status PENDING  Incomplete  Blood culture (routine x 2)     Status: None (Preliminary result)   Collection Time: 01/24/21  3:22 PM   Specimen: BLOOD  Result Value Ref Range Status   Specimen Description   Final    BLOOD LEFT ARM Performed at [facility] [address] [place], [place] [address]    Special Requests   Final    BOTTLES DRAWN AEROBIC AND ANAEROBIC Blood Culture adequate volume Performed at [facility] [address], [place], [place] [address]    Culture   Final    NO GROWTH 2 DAYS Performed at [facility] [address], [place], [place] [address]    Report Status PENDING  Incomplete  Resp Panel by RT-PCR (Flu A&B, Covid) Nasopharyngeal Swab     Status: None   Collection Time: 01/24/21  5:27 PM   Specimen: Nasopharyngeal Swab; Nasopharyngeal(NP) swabs in vial transport medium  Result Value Ref Range Status   SARS Coronavirus 2 by RT PCR NEGATIVE NEGATIVE Final    Comment: (NOTE) SARS-CoV-2  target nucleic acids are NOT DETECTED.  The SARS-CoV-2 RNA is generally detectable in upper respiratory specimens during the acute phase of infection. The lowest concentration of SARS-CoV-2 viral copies this assay can detect is 138 copies/mL. A negative result does not preclude SARS-Cov-2 infection and should not be used as the sole basis for treatment or other patient management decisions. A negative result may occur with   improper specimen collection/handling, submission of specimen other than nasopharyngeal swab, presence of viral mutation(s) within the areas targeted by this assay, and inadequate number of viral copies(<138 copies/mL). A negative result must be combined with clinical observations, patient history, and epidemiological information. The expected result is Negative.  Fact Sheet for Patients:  EntrepreneurPulse.com.au  Fact Sheet for Healthcare Providers:  IncredibleEmployment.be  This test is no t yet approved or cleared by the [place] FDA and  has been authorized for detection and/or diagnosis of SARS-CoV-2 by FDA under an Emergency Use Authorization (EUA). This EUA will remain  in effect (meaning this test can be used) for the duration of the COVID-19 declaration under Section 564(b)(1) of the Act, 21 U.S.C.section 360bbb-3(b)(1), unless the authorization is terminated  or revoked sooner.       Influenza A by PCR NEGATIVE NEGATIVE Final   Influenza B by PCR NEGATIVE NEGATIVE Final    Comment: (NOTE) The Xpert Xpress SARS-CoV-2/FLU/RSV plus assay is intended as an aid in the diagnosis of influenza from Nasopharyngeal swab specimens and should not be used as a sole basis for treatment. Nasal washings and aspirates are unacceptable for Xpert Xpress SARS-CoV-2/FLU/RSV testing.  Fact Sheet for Patients: EntrepreneurPulse.com.au  Fact Sheet for Healthcare Providers: IncredibleEmployment.be  This test is not yet approved or cleared by the [place] FDA and has been authorized for detection and/or diagnosis of SARS-CoV-2 by FDA under an Emergency Use Authorization (EUA). This EUA will remain in effect (meaning this test can be used) for the duration of the COVID-19 declaration under Section 564(b)(1) of the Act, 21 U.S.C. section 360bbb-3(b)(1), unless the authorization is terminated  or revoked.  Performed at [facility], [place] [address], [place], [place] [address]   Culture, Urine     Status: Abnormal   Collection Time: 01/24/21  5:35 PM   Specimen: Urine, Catheterized  Result Value Ref Range Status   Specimen Description   Final    URINE, CATHETERIZED Performed at [facility] [address], [place], [place] [address]    Special Requests   Final    NONE Performed at [facility], [place] [address], [place], [place] [address]    Culture MULTIPLE SPECIES PRESENT, SUGGEST RECOLLECTION (A)  Final   Report Status 01/26/2021 FINAL  Final      Radiology Studies: No results found.     LOS: 2 days   [facility] Pager on [email or web address]  01/27/2021, 10:46 AM

## 2021-01-27 NOTE — Progress Notes (Signed)
Progress Note  Patient Name: Michele Green Date of Encounter: 01/27/2021  [facility] Cardiologist: Michele Grooms, MD   Subjective   Feeling tired. No palpitations.  Breathing has improved.    Inpatient Medications    Scheduled Meds: . allopurinol  200 mg Oral QPM  . amiodarone  200 mg Oral Q12H   Followed by  . [START ON 02/02/2021] amiodarone  200 mg Oral Daily  . atorvastatin  20 mg Oral Daily  . Chlorhexidine Gluconate Cloth  6 each Topical Daily  . feeding supplement  1 Container Oral BID BM  . hydrocortisone sod succinate (SOLU-CORTEF) inj  25 mg Intravenous Q8H  . mouth rinse  15 mL Mouth Rinse BID  . mesalamine  250 mg Oral BID  . midodrine  2.5 mg Oral BID WC  . multivitamin with minerals  1 tablet Oral Daily  . Rivaroxaban  15 mg Oral QPC supper  . saccharomyces boulardii  250 mg Oral BID  . sodium chloride flush  10-40 mL Intracatheter Q12H  . tamoxifen  20 mg Oral Daily  . thiamine  100 mg Oral Daily   Continuous Infusions: . amiodarone 30 mg/hr (01/27/21 0252)  . cefTRIAXone (ROCEPHIN)  IV Stopped (01/26/21 1740)   PRN Meds: acetaminophen **OR** acetaminophen, ALPRAZolam, HYDROcodone-acetaminophen, lip balm, loperamide, sodium chloride flush   Vital Signs    Vitals:   01/27/21 1138 01/27/21 1142 01/27/21 1145 01/27/21 1200  BP:  119/71  126/79  Pulse:  (!) 119  (!) 116  Resp:  18 18 16   Temp: (!) 97.4 F (36.3 C) (!) 97.4 F (36.3 C)  (!) 97.3 F (36.3 C)  TempSrc: Oral Oral    SpO2:  100%    Weight:      Height:        Intake/Output Summary (Last 24 hours) at 01/27/2021 1321 Last data filed at 01/27/2021 1159 Gross per 24 hour  Intake 644.83 ml  Output 500 ml  Net 144.83 ml   Last 3 Weights 01/24/2021 01/20/2021 01/19/2021  Weight (lbs) 129 lb 10.1 oz 134 lb 14.7 oz 132 lb 4.4 oz  Weight (kg) 58.8 kg 61.2 kg 60 kg      Telemetry    Atrial tachycardia.  Rate 115 bpm. - Personally Reviewed  ECG    n/a - Personally  Reviewed  Physical Exam   VS:  BP 126/79   Pulse (!) 116   Temp (!) 97.3 F (36.3 C)   Resp 16   Ht 5' 4"  (1.626 m)   Wt 58.8 kg   SpO2 100%   BMI 22.25 kg/m  , BMI Body mass index is 22.25 kg/m. GENERAL:  Well appearing HEENT: Pupils equal round and reactive, fundi not visualized, oral mucosa unremarkable NECK:  No jugular venous distention, waveform within normal limits, carotid upstroke brisk and symmetric, no bruits LUNGS:  Clear to auscultation bilaterally HEART: Tachycardic.  Regular.  PMI not displaced or sustained,S1 and S2 within normal limits, no S3, no S4, no clicks, no rubs, no murmurs ABD:  Flat, positive bowel sounds normal in frequency in pitch, no bruits, no rebound, no guarding, no midline pulsatile mass, no hepatomegaly, no splenomegaly EXT:  2 plus pulses throughout, no edema, no cyanosis no clubbing SKIN:  No rashes no nodules NEURO:  Cranial nerves II through XII grossly intact, motor grossly intact throughout PSYCH:  Cognitively intact, oriented to person place and time  Labs    High Sensitivity Troponin:   Recent  Labs  Lab 01/16/21 1909 01/17/21 0524 01/24/21 1522 01/24/21 1707  TROPONINIHS 31* 30* 25* 24*      Chemistry Recent Labs  Lab 01/25/21 0534 01/26/21 0749 01/27/21 0458  NA 140 139 139  K 3.4* 3.4* 3.9  CL 108 107 110  CO2 26 25 25   GLUCOSE 118* 124* 131*  BUN 12 12 12   CREATININE 0.56 0.50 0.56  CALCIUM 7.8* 7.7* 7.8*  PROT 4.8* 4.9* 4.8*  ALBUMIN 2.3* 2.2* 2.2*  AST 32 25 35  ALT 30 30 37  ALKPHOS 45 46 47  BILITOT 0.9 1.0 0.6  GFRNONAA >60 >60 >60  ANIONGAP 6 7 4*     Hematology Recent Labs  Lab 01/25/21 0534 01/26/21 0749 01/27/21 0458  WBC 8.6 6.5 10.1  RBC 2.44* 2.52* 2.35*  HGB 7.3* 7.6* 7.2*  HCT 24.4* 25.1* 23.1*  MCV 100.0 99.6 98.3  MCH 29.9 30.2 30.6  MCHC 29.9* 30.3 31.2  RDW 15.7* 15.5 15.8*  PLT 107* 120* 132*    BNP Recent Labs  Lab 01/24/21 1610  BNP 478.7*     DDimer No results for  input(s): DDIMER in the last 168 hours.   Radiology    No results found.  Cardiac Studies   Echo 01/17/21:  1. s/p Mitral Valve Repair. Procedure Date: 08/10/2018.  2. Right ventricular systolic function is normal. The right ventricular  size is normal. There is moderately elevated pulmonary artery systolic  pressure. The estimated right ventricular systolic pressure is 16.1 mmHg.  3. Left atrial size was severely dilated.  4. The aortic valve is tricuspid. Aortic valve regurgitation is not  visualized. No aortic stenosis is present.  5. Tricuspid valve regurgitation is moderate.  6. Left ventricular ejection fraction, by estimation, is 60 to 65%. The  left ventricle has normal function. The left ventricle has no regional  wall motion abnormalities. Left ventricular diastolic parameters are  indeterminate.  7. The inferior vena cava is normal in size with <50% respiratory  variability, suggesting right atrial pressure of 8 mmHg.   Patient Profile     80 y.o. female with CAD status post CABG, mitral vegetation status post repair, atrial fibrillation status post maze, recurrent atrial fibrillation, hypertension, hyperlipidemia, and chronic hypoxic respiratory failure on home O2 admitted with acute on chronic hypoxia and found to be in atrial fibrillation with RVR.  Assessment & Plan    # Persistent atrial fibrillation: # atrial tachycardia: Michele Green has struggled to maintain sinus rhythm.  She has undergone a MAZE procedure and was back in atrial fibrillation this admission.  Currently appears she is in atrial tachycardia.  P waves are present and if she is consistently at a rate of about 120 bpm.  She is currently loading on amiodarone.  Blood pressure has stabilized off diltiazem.  In the past when in atrial fibrillation her heart rates were well-controlled on digoxin.  It was previously discontinued to elevated levels, though she had no symptoms.  We will review with EP and  likely start low-dose digoxin.  Given that she has hypotension on midodrine, we will not start a nodal agent.  Continue Xarelto.  # CAD s/p CABG:  Not an active issue.  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      For questions or updates, please contact [facility] Please consult [email or web address] for contact info under        Signed, Skeet Latch, MD  01/27/2021, 1:21 PM

## 2021-01-27 NOTE — Plan of Care (Signed)
  Problem: Clinical Measurements: Goal: Respiratory complications will improve Outcome: Progressing   Problem: Nutrition: Goal: Adequate nutrition will be maintained Outcome: Progressing   Problem: Pain Managment: Goal: General experience of comfort will improve Outcome: Progressing   Problem: Safety: Goal: Ability to remain free from injury will improve Outcome: Progressing   Problem: Skin Integrity: Goal: Risk for impaired skin integrity will decrease Outcome: Progressing

## 2021-01-27 NOTE — TOC Progression Note (Signed)
Transition of Care [facility]) - Progression Note    Patient Details  Name: CHARLOTTIE PERAGINE MRN: [id number] Date of Birth: [birth year]  Transition of Care [facility]) CM/SW Contact  Purcell Mouton, RN Phone Number: 01/27/2021, 12:29 PM  Clinical Narrative:    Pt from [facility] and plan to return. A call to [facility] was made with no answer, left VM.    Expected Discharge Plan: Assisted Living Barriers to Discharge: No Barriers Identified  Expected Discharge Plan and Services Expected Discharge Plan: Assisted Living   Discharge Planning Services: CM Consult   Living arrangements for the past 2 months: Assisted Living Facility                                       Social Determinants of Health (SDOH) Interventions    Readmission Risk Interventions Readmission Risk Prevention Plan 01/19/2021  Medication Review (RN Care Manager) Complete  PCP or Specialist appointment within 3-5 days of discharge Complete  HRI or Home Care Consult Complete  SW Recovery Care/Counseling Consult Complete  Palliative Care Screening Complete  Skilled Nursing Facility Complete  Some recent data might be hidden

## 2021-01-27 NOTE — TOC Progression Note (Signed)
Transition of Care [facility]) - Progression Note    Patient Details  Name: Michele Green MRN: [id number] Date of Birth: August 25, 1941  Transition of Care [facility]) CM/SW Contact  Purcell Mouton, RN Phone Number: 01/27/2021, 3:10 PM  Clinical Narrative:    A call to [facility] was made to Admission Coordinator.  Pt may return when medically stable. Facility asked for COVID test, Hospice on discharge summary, and new FL2.    Expected Discharge Plan: Assisted Living Barriers to Discharge: No Barriers Identified  Expected Discharge Plan and Services Expected Discharge Plan: Assisted Living   Discharge Planning Services: CM Consult   Living arrangements for the past 2 months: Assisted Living Facility                                       Social Determinants of Health (SDOH) Interventions    Readmission Risk Interventions Readmission Risk Prevention Plan 01/19/2021  Medication Review (RN Care Manager) Complete  PCP or Specialist appointment within 3-5 days of discharge Complete  HRI or Home Care Consult Complete  SW Recovery Care/Counseling Consult Complete  Palliative Care Screening Complete  Skilled Nursing Facility Complete  Some recent data might be hidden

## 2021-01-28 DIAGNOSIS — I4891 Unspecified atrial fibrillation: Secondary | ICD-10-CM | POA: Diagnosis not present

## 2021-01-28 LAB — TYPE AND SCREEN
ABO/RH(D): O POS
Antibody Screen: NEGATIVE
Unit division: 0

## 2021-01-28 LAB — CBC
HCT: 30.9 % — ABNORMAL LOW (ref 36.0–46.0)
Hemoglobin: 9.9 g/dL — ABNORMAL LOW (ref 12.0–15.0)
MCH: 30.4 pg (ref 26.0–34.0)
MCHC: 32 g/dL (ref 30.0–36.0)
MCV: 94.8 fL (ref 80.0–100.0)
Platelets: 121 10*3/uL — ABNORMAL LOW (ref 150–400)
RBC: 3.26 MIL/uL — ABNORMAL LOW (ref 3.87–5.11)
RDW: 17 % — ABNORMAL HIGH (ref 11.5–15.5)
WBC: 12 10*3/uL — ABNORMAL HIGH (ref 4.0–10.5)
nRBC: 0.2 % (ref 0.0–0.2)

## 2021-01-28 LAB — BPAM RBC
Blood Product Expiration Date: 202204022359
ISSUE DATE / TIME: 202203081146
Unit Type and Rh: 5100

## 2021-01-28 LAB — PROTIME-INR
INR: 1.5 — ABNORMAL HIGH (ref 0.8–1.2)
Prothrombin Time: 17.5 seconds — ABNORMAL HIGH (ref 11.4–15.2)

## 2021-01-28 LAB — COMPREHENSIVE METABOLIC PANEL
ALT: 35 U/L (ref 0–44)
AST: 27 U/L (ref 15–41)
Albumin: 2.3 g/dL — ABNORMAL LOW (ref 3.5–5.0)
Alkaline Phosphatase: 45 U/L (ref 38–126)
Anion gap: 5 (ref 5–15)
BUN: 11 mg/dL (ref 8–23)
CO2: 25 mmol/L (ref 22–32)
Calcium: 7.8 mg/dL — ABNORMAL LOW (ref 8.9–10.3)
Chloride: 109 mmol/L (ref 98–111)
Creatinine, Ser: 0.52 mg/dL (ref 0.44–1.00)
GFR, Estimated: >60 mL/min (ref >60)
Glucose, Bld: 89 mg/dL (ref 70–99)
Potassium: 3.7 mmol/L (ref 3.5–5.1)
Sodium: 139 mmol/L (ref 135–145)
Total Bilirubin: 0.7 mg/dL (ref 0.3–1.2)
Total Protein: 4.8 g/dL — ABNORMAL LOW (ref 6.5–8.1)

## 2021-01-28 MED ORDER — AMIODARONE HCL IN DEXTROSE 360-4.14 MG/200ML-% IV SOLN
30.0000 mg/h | INTRAVENOUS | Status: DC
  Administered 2021-01-28 – 2021-01-29 (×3): 30 mg/h via INTRAVENOUS
  Filled 2021-01-28 (×4): qty 200

## 2021-01-28 MED ORDER — FUROSEMIDE 10 MG/ML IJ SOLN
40.0000 mg | Freq: Two times a day (BID) | INTRAMUSCULAR | Status: DC
  Administered 2021-01-28 – 2021-02-02 (×11): 40 mg via INTRAVENOUS
  Filled 2021-01-28 (×11): qty 4

## 2021-01-28 MED ORDER — AMIODARONE LOAD VIA INFUSION
150.0000 mg | Freq: Once | INTRAVENOUS | Status: AC
  Administered 2021-01-28: 150 mg via INTRAVENOUS
  Filled 2021-01-28: qty 83.34

## 2021-01-28 MED ORDER — AMIODARONE HCL IN DEXTROSE 360-4.14 MG/200ML-% IV SOLN
60.0000 mg/h | INTRAVENOUS | Status: DC
  Administered 2021-01-28: 60 mg/h via INTRAVENOUS
  Filled 2021-01-28: qty 200

## 2021-01-28 MED ORDER — ACETAMINOPHEN 650 MG RE SUPP: 650.0000 mg | Freq: Four times a day (QID) | RECTAL | Status: DC | PRN

## 2021-01-28 MED ORDER — AMIODARONE IV BOLUS ONLY 150 MG/100ML
150.0000 mg | Freq: Once | INTRAVENOUS | Status: AC
  Administered 2021-01-28: 150 mg via INTRAVENOUS
  Filled 2021-01-28: qty 100

## 2021-01-28 MED ORDER — ACETAMINOPHEN 500 MG PO TABS
1000.0000 mg | ORAL_TABLET | Freq: Four times a day (QID) | ORAL | Status: DC | PRN
  Administered 2021-01-28 – 2021-02-02 (×2): 1000 mg via ORAL
  Filled 2021-01-28 (×2): qty 2

## 2021-01-28 NOTE — Care Plan (Signed)
RN noted patient to be persistently tachycardic with HR 140s. I reviewed the ECG obtained and this shows an interval rhythm change to atrial tachycardia, mostly 1:1 with occasional non-conducted atrial beats. No alarming symptoms. Will try amiodarone bolus and make her NPO in case cardioversion is ultimately required tomorrow.

## 2021-01-28 NOTE — Progress Notes (Signed)
Notified by nurse that patient's HR switched from 1teens to 140s-150s, BP stable in the 120s, patient asymptomatic with rhythm change. Reviewed with Dr. Harl Bowie. EKG was done felt c/w atrial tach, similar diffuse ST depression noted as prior EKG. Patient scheduled for DCCV tomorrow. Dr. Harl Bowie suggests amiodarone bolus @ 177m and continue current drip with continued plan for DCCV tomorrow. Nurse will notify for any new symptoms or clinical changes. Jameison Haji PA-C

## 2021-01-28 NOTE — Progress Notes (Signed)
Progress Note  Patient Name: Michele Green Date of Encounter: 01/28/2021  [facility] Cardiologist: Michele Grooms, MD   Subjective   Feeling tired. Not feeling well and more short of breath.  Inpatient Medications    Scheduled Meds: . allopurinol  200 mg Oral QPM  . atorvastatin  20 mg Oral Daily  . Chlorhexidine Gluconate Cloth  6 each Topical Daily  . feeding supplement  1 Container Oral BID BM  . hydrocortisone sod succinate (SOLU-CORTEF) inj  25 mg Intravenous Q8H  . mouth rinse  15 mL Mouth Rinse BID  . mesalamine  250 mg Oral BID  . midodrine  2.5 mg Oral BID WC  . multivitamin with minerals  1 tablet Oral Daily  . Rivaroxaban  15 mg Oral QPC supper  . saccharomyces boulardii  250 mg Oral BID  . sodium chloride flush  10-40 mL Intracatheter Q12H  . tamoxifen  20 mg Oral Daily  . thiamine  100 mg Oral Daily   Continuous Infusions: . amiodarone 60 mg/hr (01/28/21 0851)   Followed by  . amiodarone    . cefTRIAXone (ROCEPHIN)  IV Stopped (01/27/21 1900)   PRN Meds: acetaminophen **OR** acetaminophen, ALPRAZolam, HYDROcodone-acetaminophen, lip balm, loperamide, sodium chloride flush   Vital Signs    Vitals:   01/28/21 0215 01/28/21 0400 01/28/21 0838 01/28/21 0947  BP: 107/79 114/74 126/77 (!) 141/84  Pulse: (!) 138 (!) 120 (!) 114 98  Resp:   20   Temp:  97.6 F (36.4 C) (!) 97.4 F (36.3 C)   TempSrc:  Oral Oral   SpO2: 98% 98% 97%   Weight:      Height:        Intake/Output Summary (Last 24 hours) at 01/28/2021 1036 Last data filed at 01/28/2021 0600 Gross per 24 hour  Intake 932 ml  Output 900 ml  Net 32 ml   Last 3 Weights 01/24/2021 01/20/2021 01/19/2021  Weight (lbs) 129 lb 10.1 oz 134 lb 14.7 oz 132 lb 4.4 oz  Weight (kg) 58.8 kg 61.2 kg 60 kg      Telemetry    Atrial tachycardia.  Rate 115 bpm. Previously atrial fibrillation 140s.  Personally Reviewed  ECG    n/a - Personally Reviewed  Physical Exam   VS:  BP (!) 141/84   Pulse  98   Temp (!) 97.4 F (36.3 C) (Oral)   Resp 20   Ht 5' 4"  (1.626 m)   Wt 58.8 kg   SpO2 97%   BMI 22.25 kg/m  , BMI Body mass index is 22.25 kg/m. GENERAL:  Well appearing HEENT: Pupils equal round and reactive, fundi not visualized, oral mucosa unremarkable NECK:  No jugular venous distention, waveform within normal limits, carotid upstroke brisk and symmetric, no bruits LUNGS:  Clear to auscultation bilaterally HEART: Tachycardic.  Regular.  PMI not displaced or sustained,S1 and S2 within normal limits, no S3, no S4, no clicks, no rubs, no murmurs ABD:  Flat, positive bowel sounds normal in frequency in pitch, no bruits, no rebound, no guarding, no midline pulsatile mass, no hepatomegaly, no splenomegaly EXT:  2 plus pulses throughout, 2+ LE edema, no cyanosis no clubbing SKIN:  No rashes no nodules NEURO:  Cranial nerves II through XII grossly intact, motor grossly intact throughout PSYCH:  Cognitively intact, oriented to person place and time  Labs    High Sensitivity Troponin:   Recent Labs  Lab 01/16/21 1909 01/17/21 0524 01/24/21 1522 01/24/21 1707  TROPONINIHS 31* 30* 25* 24*      Chemistry Recent Labs  Lab 01/26/21 0749 01/27/21 0458 01/28/21 0610  NA 139 139 139  K 3.4* 3.9 3.7  CL 107 110 109  CO2 25 25 25   GLUCOSE 124* 131* 89  BUN 12 12 11   CREATININE 0.50 0.56 0.52  CALCIUM 7.7* 7.8* 7.8*  PROT 4.9* 4.8* 4.8*  ALBUMIN 2.2* 2.2* 2.3*  AST 25 35 27  ALT 30 37 35  ALKPHOS 46 47 45  BILITOT 1.0 0.6 0.7  GFRNONAA >60 >60 >60  ANIONGAP 7 4* 5     Hematology Recent Labs  Lab 01/26/21 0749 01/27/21 0458 01/28/21 0610  WBC 6.5 10.1 12.0*  RBC 2.52* 2.35* 3.26*  HGB 7.6* 7.2* 9.9*  HCT 25.1* 23.1* 30.9*  MCV 99.6 98.3 94.8  MCH 30.2 30.6 30.4  MCHC 30.3 31.2 32.0  RDW 15.5 15.8* 17.0*  PLT 120* 132* 121*    BNP Recent Labs  Lab 01/24/21 1610  BNP 478.7*     DDimer No results for input(s): DDIMER in the last 168 hours.   Radiology     No results found.  Cardiac Studies   Echo 01/17/21:  1. s/p Mitral Valve Repair. Procedure Date: 08/10/2018.  2. Right ventricular systolic function is normal. The right ventricular  size is normal. There is moderately elevated pulmonary artery systolic  pressure. The estimated right ventricular systolic pressure is 73.4 mmHg.  3. Left atrial size was severely dilated.  4. The aortic valve is tricuspid. Aortic valve regurgitation is not  visualized. No aortic stenosis is present.  5. Tricuspid valve regurgitation is moderate.  6. Left ventricular ejection fraction, by estimation, is 60 to 65%. The  left ventricle has normal function. The left ventricle has no regional  wall motion abnormalities. Left ventricular diastolic parameters are  indeterminate.  7. The inferior vena cava is normal in size with <50% respiratory  variability, suggesting right atrial pressure of 8 mmHg.   Patient Profile     80 y.o. female with CAD status post CABG, mitral vegetation status post repair, atrial fibrillation status post maze, recurrent atrial fibrillation, hypertension, hyperlipidemia, and chronic hypoxic respiratory failure on home O2 admitted with acute on chronic hypoxia and found to be in atrial fibrillation with RVR.  Assessment & Plan    # Persistent atrial fibrillation: # atrial tachycardia: Michele Green has struggled to maintain sinus rhythm.  She has undergone a MAZE procedure and was back in atrial fibrillation this admission.  Digoxin levels were elevated so it has been discontinued.  She only received 1 dose in the hospital.  The digoxin level drawn on 3/9 was drawn immediately after getting IV digoxin and therefore is not accurate.  Diltiazem was ineffective at controlling her heart rate yesterday and titration of the dose has been difficult due to hypotension.  She has been started back on IV amiodarone and is back in atrial tachycardia.  We will plan for cardioversion tomorrow.   She has not missed any doses of her Xarelto.  We will have her remain at [facility] so that she can be evaluated by electrophysiology as needed.   # Acute diastolic heart failure:  She is more volume overloaded and dyspneic today.  This is likely because her heart rates were elevated yesterday.  She received 1 dose of Lasix 20 mg IV and was still net positive.  Increase to 40 mg IV twice daily.  # CAD s/p CABG:  Not an  active issue.  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      For questions or updates, please contact [place] Please consult [email or web address] for contact info under        Signed, Skeet Latch, MD  01/28/2021, 10:36 AM

## 2021-01-28 NOTE — Progress Notes (Signed)
PROGRESS NOTE   Michele Green  MOL:[id number]    DOB: 1941/04/12    DOA: 01/24/2021  PCP: [name], MD   I have briefly reviewed patients previous medical records in [facility]  Chief complaint on admission: Dysuria  Brief Narrative:  80 year old female with medical history not limited to atrial fibrillation, breast cancer, coronary artery disease, mitral valve repair s/p Maze procedure, ulcerative colitis, chronic respiratory failure with hypoxia, Covid 19 infection in January 2022, recently discharged from the hospital on January 21, 2021 after management for hypotension and A. fib with elevated digoxin level.  She was discharged to SNF for short-term rehab.  She returned with complaints of feeling poorly and low oxygen levels.  She was admitted for A. fib with RVR along with hypotension.  Cardiology assisting with difficult to control A. fib with RVR.  Patient being transferred from [facility] to [facility] on 3/9 for DCCV on 3/10 and EP cardiology consultation.   Assessment & Plan:  Active Problems:   On continuous oral anticoagulation   Persistent atrial fibrillation (HCC)   Breast cancer of upper-outer quadrant of left female breast (HCC)   Chronic diastolic HF (heart failure) (HCC)   Atrial fibrillation with RVR (HCC)   Hypotension   Elevated troponin   Ulcerative colitis (HCC)   Adrenal insufficiency (Addison's disease) (HCC)   Anemia of chronic disease   Functional quadriplegia (HCC)   Atrial tachycardia (HCC)   Persistent A. fib with RVR/atrial tachycardia  Previously on metoprolol, diltiazem and digoxin.  However during prior hospitalization she had elevated digoxin levels and this was discontinued.  Admitted again for A. fib with RVR.  She was initially on diltiazem infusion which was ineffective at controlling heart rate and titration of dose was complicated by hypotension and hence diltiazem was discontinued.  Midodrine and stress dose  steroids were initiated.  Hypotension resolved.  Cardiology consulted and continue to follow.  The digoxin level drawn on 3/9 was drawn immediately after getting IV digoxin and therefore inaccurate.  Overnight 3/8, persistent tachycardia in the 140s, suspecting atrial tachycardia with one-to-one conduction.  Patient however asymptomatic.  Discussed with Dr. Oval Linsey, Cardiology, recommends transfer to [facility] today for DCCV 3/10 and evaluation by EP cardiology.  Remains on Xarelto.  Adrenal insufficiency with hypotension  Currently on stress dose steroids/IV hydrocortisone 25 mg every 8 hourly.  Hydrocortisone being gradually tapered down and so also midodrine.  Patient on chronic steroids, unclear indication, may be due to ulcerative colitis.  Steroids to be tapered down to her prior home dose of steroids and not to be completely discontinued  Acute on chronic diastolic CHF  Likely precipitated by tachyarrhythmia.  Continue IV Lasix 40 mg twice daily.  Anxiety  Appears to be situational.  Low-dose alprazolam as needed.  Loose stools  Abdomen is benign.  Could be related to medications.  Patient does have a history of ulcerative colitis.  Imodium as needed.  Per patient no blood noted in the stool.  Right elbow pain and swelling/for joint infection/history of gout  Patient noted to be on allopurinol.  Uric acid level was only 3.7.    Joint effusion was noted on imaging studies.  WBC was noted to be elevated.    Seen by orthopedics.  Appreciate their input.  Conservative management is recommended at this time.  No need for arthrocentesis per orthopedics.    History of ulcerative colitis  Could be the reason why she has required steroids previously.  Loose stools as discussed above.  Noted to be on mesalamine which is being continued.  Mildly elevated troponin/history of mitral valve repair/maze procedure  Elevated troponin likely due to tachycardia.  Patient does not  have any anginal symptoms currently.  Last echocardiogram was in February 2022 which showed normal left ventricular systolic function without any wall motion abnormalities.  Right ventricular systolic pressure was noted to be elevated.   Concern for recurrent UTI  Patient continued on ceftriaxone (day 4/7 on 3/9) due to persistent dysuria.  Urine culture with multiple species and hence not helpful.  Patient continues to have dysuria although it is better than before.  Procalcitonin levels have been improving.  Complete full course due to immunocompromise state.  History of breast cancer  Stable.  Tamoxifen being continued.  Anemia of chronic disease/thrombocytopenia  Drop in hemoglobin appears to be dilutional.  No evidence of overt bleeding.  Patient does have profound fatigue which could be due to her anemia.  S/p 1 unit of PRBC.  Anemia panel reviewed.  No clear-cut deficiencies identified.  Hemoglobin up from 7.2-9.9.  Trend daily CBCs  Thrombocytopenia appears chronic and stable.  Functional quadriplegia  PT and OT will be involved.  Recommending SNF when stable.  Dyslipidemia  Continue statin.  Hypokalemia  Potassium corrected.  Magnesium is 2.0.   Body mass index is 22.25 kg/m.  Nutritional Status Nutrition Problem: Increased nutrient needs Etiology: acute illness,chronic illness Signs/Symptoms: estimated needs Interventions: Boost Breeze,Magic cup,MVI  Pressure Injury 12/02/20 Anus Medial Stage 1 -  Intact skin with non-blanchable redness of a localized area usually over a bony prominence. (Active)  12/02/20 0030  Location: Anus  Location Orientation: Medial  Staging: Stage 1 -  Intact skin with non-blanchable redness of a localized area usually over a bony prominence.  Wound Description (Comments):   Present on Admission: Yes     Pressure Injury 12/10/20 Buttocks Right;Bilateral Stage 1 -  Intact skin with non-blanchable redness of a localized area usually  over a bony prominence. Redness to sacrum and groin (Active)  12/10/20 1600  Location: Buttocks  Location Orientation: Right;Bilateral  Staging: Stage 1 -  Intact skin with non-blanchable redness of a localized area usually over a bony prominence.  Wound Description (Comments): Redness to sacrum and groin  Present on Admission:      Pressure Injury 01/23/21 Heel Left Stage 1 -  Intact skin with non-blanchable redness of a localized area usually over a bony prominence. (Active)  01/23/21 2200  Location: Heel  Location Orientation: Left  Staging: Stage 1 -  Intact skin with non-blanchable redness of a localized area usually over a bony prominence.  Wound Description (Comments):   Present on Admission: Yes        DVT prophylaxis:   Remains on Xarelto anticoagulation.   Code Status: DNR Family Communication: None at bedside Disposition:  Status is: Inpatient  Remains inpatient appropriate because:Hemodynamically unstable   Dispo:  Patient From: [facility]  Planned Disposition: [facility]  Medically stable for discharge: No          Consultants:   Cardiology Orthopedics  Procedures:   None  Antimicrobials:    Anti-infectives (From admission, onward)   Start     Dose/Rate Route Frequency Ordered Stop   01/25/21 1700  cefTRIAXone (ROCEPHIN) 1 g in sodium chloride 0.9 % 100 mL IVPB        1 g 200 mL/hr over 30 Minutes Intravenous Every 24 hours 01/24/21 2105 02/01/21  1659   01/24/21 1745  cefTRIAXone (ROCEPHIN) 1 g in sodium chloride 0.9 % 100 mL IVPB        1 g 200 mL/hr over 30 Minutes Intravenous  Once 01/24/21 1734 01/24/21 1808        Subjective:  Overnight events noted.  Noted tachycardia up to 140s.  During my visit this morning patient however asymptomatic of chest pain, dyspnea, palpitations, dizziness or lightheadedness.  Subsequently to the cardiologist she did mention that she was not feeling well and more short of  breath.  Objective:   Vitals:   01/28/21 0215 01/28/21 0400 01/28/21 0838 01/28/21 0947  BP: 107/79 114/74 126/77 (!) 141/84  Pulse: (!) 138 (!) 120 (!) 114 98  Resp:   20   Temp:  97.6 F (36.4 C) (!) 97.4 F (36.3 C)   TempSrc:  Oral Oral   SpO2: 98% 98% 97%   Weight:      Height:        General exam: Pleasant elderly female, moderately built and nourished lying comfortably propped up in bed without distress. Respiratory system: Occasional basal crackles but otherwise clear to auscultation Cardiovascular system: S1 & S2 heard, tachycardic and irregularly irregular. No JVD, murmurs, rubs, gallops or clicks.  3+ pitting bilateral leg edema.  Telemetry personally reviewed: Overnight had A. fib with RVR in the 140s, down to 110s-120s later this morning. Gastrointestinal system: Abdomen is nondistended, soft and nontender. No organomegaly or masses felt. Normal bowel sounds heard. Central nervous system: Alert and oriented. No focal neurological deficits. Extremities: Symmetric 5 x 5 power. Skin: No rashes, lesions or ulcers Psychiatry: Judgement and insight appear normal. Mood & affect appropriate.     Data Reviewed:   I have personally reviewed following labs and imaging studies   CBC: Recent Labs  Lab 01/24/21 1610 01/25/21 0534 01/26/21 0749 01/27/21 0458 01/28/21 0610  WBC 13.8* 8.6 6.5 10.1 12.0*  NEUTROABS 13.0* 7.9*  --   --   --   HGB 8.0* 7.3* 7.6* 7.2* 9.9*  HCT 25.8* 24.4* 25.1* 23.1* 30.9*  MCV 99.2 100.0 99.6 98.3 94.8  PLT 123* 107* 120* 132* 121*    Basic Metabolic Panel: Recent Labs  Lab 01/24/21 1857 01/24/21 1934 01/25/21 0534 01/26/21 0749 01/27/21 0458 01/28/21 0610  NA  --   --  140 139 139 139  K  --   --  3.4* 3.4* 3.9 3.7  CL  --   --  108 107 110 109  CO2  --   --  26 25 25 25   GLUCOSE  --   --  118* 124* 131* 89  BUN  --   --  12 12 12 11   CREATININE  --   --  0.56 0.50 0.56 0.52  CALCIUM  --   --  7.8* 7.7* 7.8* 7.8*  MG  --     < > 1.8 1.7 2.0  --   PHOS 3.2  --  3.2  --   --   --    < > = values in this interval not displayed.    Liver Function Tests: Recent Labs  Lab 01/26/21 0749 01/27/21 0458 01/28/21 0610  AST 25 35 27  ALT 30 37 35  ALKPHOS 46 47 45  BILITOT 1.0 0.6 0.7  PROT 4.9* 4.8* 4.8*  ALBUMIN 2.2* 2.2* 2.3*    CBG: No results for input(s): GLUCAP in the last 168 hours.  Microbiology Studies:   Recent Results (from  the past 240 hour(s))  SARS CORONAVIRUS 2 (TAT 6-24 HRS) Nasopharyngeal Nasopharyngeal Swab     Status: None   Collection Time: 01/20/21  6:35 PM   Specimen: Nasopharyngeal Swab  Result Value Ref Range Status   SARS Coronavirus 2 NEGATIVE NEGATIVE Final    Comment: (NOTE) SARS-CoV-2 target nucleic acids are NOT DETECTED.  The SARS-CoV-2 RNA is generally detectable in upper and lower respiratory specimens during the acute phase of infection. Negative results do not preclude SARS-CoV-2 infection, do not rule out co-infections with other pathogens, and should not be used as the sole basis for treatment or other patient management decisions. Negative results must be combined with clinical observations, patient history, and epidemiological information. The expected result is Negative.  Fact Sheet for Patients: SugarRoll.be  Fact Sheet for Healthcare Providers: https://www.woods-mathews.com/  This test is not yet approved or cleared by the [place] FDA and  has been authorized for detection and/or diagnosis of SARS-CoV-2 by FDA under an Emergency Use Authorization (EUA). This EUA will remain  in effect (meaning this test can be used) for the duration of the COVID-19 declaration under Se ction 564(b)(1) of the Act, 21 U.S.C. section 360bbb-3(b)(1), unless the authorization is terminated or revoked sooner.  Performed at [facility], [place] [address], [place], [place] [address]   Blood culture (routine x 2)      Status: None (Preliminary result)   Collection Time: 01/24/21  3:13 PM   Specimen: BLOOD  Result Value Ref Range Status   Specimen Description   Final    BLOOD BLOOD LEFT ARM Performed at [facility] [address], [place], [place] [address]    Special Requests   Final    BOTTLES DRAWN AEROBIC AND ANAEROBIC Blood Culture adequate volume Performed at [facility] [address], [place], [place] [address]    Culture   Final    NO GROWTH 3 DAYS Performed at [facility], [place] [address] [place], [place] [address]    Report Status PENDING  Incomplete  Blood culture (routine x 2)     Status: None (Preliminary result)   Collection Time: 01/24/21  3:22 PM   Specimen: BLOOD  Result Value Ref Range Status   Specimen Description   Final    BLOOD LEFT ARM Performed at [facility] [address], [place], [place] [address]    Special Requests   Final    BOTTLES DRAWN AEROBIC AND ANAEROBIC Blood Culture adequate volume Performed at [facility] [address], [place], [place] [address]    Culture   Final    NO GROWTH 4 DAYS Performed at [facility], [place] [address] [place], [place] [address]    Report Status PENDING  Incomplete  Resp Panel by RT-PCR (Flu A&B, Covid) Nasopharyngeal Swab     Status: None   Collection Time: 01/24/21  5:27 PM   Specimen: Nasopharyngeal Swab; Nasopharyngeal(NP) swabs in vial transport medium  Result Value Ref Range Status   SARS Coronavirus 2 by RT PCR NEGATIVE NEGATIVE Final    Comment: (NOTE) SARS-CoV-2 target nucleic acids are NOT DETECTED.  The SARS-CoV-2 RNA is generally detectable in upper respiratory specimens during the acute phase of infection. The lowest concentration of SARS-CoV-2 viral copies this assay can detect is 138 copies/mL. A negative result does not preclude SARS-Cov-2 infection and should not be used as the sole basis for treatment  or other patient management  decisions. A negative result may occur with  improper specimen collection/handling, submission of specimen other than nasopharyngeal swab, presence of viral mutation(s) within the areas targeted by this assay, and inadequate number of viral copies(<138 copies/mL). A negative result must be combined with clinical observations, patient history, and epidemiological information. The expected result is Negative.  Fact Sheet for Patients:  EntrepreneurPulse.com.au  Fact Sheet for Healthcare Providers:  IncredibleEmployment.be  This test is no t yet approved or cleared by the [place] FDA and  has been authorized for detection and/or diagnosis of SARS-CoV-2 by FDA under an Emergency Use Authorization (EUA). This EUA will remain  in effect (meaning this test can be used) for the duration of the COVID-19 declaration under Section 564(b)(1) of the Act, 21 U.S.C.section 360bbb-3(b)(1), unless the authorization is terminated  or revoked sooner.       Influenza A by PCR NEGATIVE NEGATIVE Final   Influenza B by PCR NEGATIVE NEGATIVE Final    Comment: (NOTE) The Xpert Xpress SARS-CoV-2/FLU/RSV plus assay is intended as an aid in the diagnosis of influenza from Nasopharyngeal swab specimens and should not be used as a sole basis for treatment. Nasal washings and aspirates are unacceptable for Xpert Xpress SARS-CoV-2/FLU/RSV testing.  Fact Sheet for Patients: EntrepreneurPulse.com.au  Fact Sheet for Healthcare Providers: IncredibleEmployment.be  This test is not yet approved or cleared by the [place] FDA and has been authorized for detection and/or diagnosis of SARS-CoV-2 by FDA under an Emergency Use Authorization (EUA). This EUA will remain in effect (meaning this test can be used) for the duration of the COVID-19 declaration under Section 564(b)(1) of the Act, 21 U.S.C. section  360bbb-3(b)(1), unless the authorization is terminated or revoked.  Performed at [facility], [place] [address], [place], [place] [address]   Culture, Urine     Status: Abnormal   Collection Time: 01/24/21  5:35 PM   Specimen: Urine, Catheterized  Result Value Ref Range Status   Specimen Description   Final    URINE, CATHETERIZED Performed at [facility] [address] [place], [place] [address]    Special Requests   Final    NONE Performed at [facility], [place] [address] [place], [place] [address]    Culture MULTIPLE SPECIES PRESENT, SUGGEST RECOLLECTION (A)  Final   Report Status 01/26/2021 FINAL  Final     Radiology Studies:  No results found.   Scheduled Meds:   . allopurinol  200 mg Oral QPM  . atorvastatin  20 mg Oral Daily  . Chlorhexidine Gluconate Cloth  6 each Topical Daily  . feeding supplement  1 Container Oral BID BM  . furosemide  40 mg Intravenous BID  . hydrocortisone sod succinate (SOLU-CORTEF) inj  25 mg Intravenous Q8H  . mouth rinse  15 mL Mouth Rinse BID  . mesalamine  250 mg Oral BID  . midodrine  2.5 mg Oral BID WC  . multivitamin with minerals  1 tablet Oral Daily  . Rivaroxaban  15 mg Oral QPC supper  . saccharomyces boulardii  250 mg Oral BID  . sodium chloride flush  10-40 mL Intracatheter Q12H  . tamoxifen  20 mg Oral Daily  . thiamine  100 mg Oral Daily    Continuous Infusions:   . amiodarone 60 mg/hr (01/28/21 0851)   Followed by  . amiodarone    . cefTRIAXone (ROCEPHIN)  IV Stopped (01/27/21 1900)     LOS: 3 days  Vernell Leep, MD, [place], [facility]. [facility]    To contact the attending provider between 7A-7P or the covering provider during after hours 7P-7A, please log into the web site [email or web address] and access using universal [place] password for that web site. If you do not have the password, please call the hospital operator.  01/28/2021, 11:50 AM

## 2021-01-28 NOTE — Plan of Care (Signed)
  Problem: Clinical Measurements: Goal: Ability to maintain clinical measurements within normal limits will improve Outcome: Progressing Goal: Will remain free from infection Outcome: Progressing Goal: Respiratory complications will improve Outcome: Progressing Goal: Cardiovascular complication will be avoided Outcome: Progressing   Problem: Pain Managment: Goal: General experience of comfort will improve Outcome: Progressing   Problem: Safety: Goal: Ability to remain free from injury will improve Outcome: Progressing   Problem: Skin Integrity: Goal: Risk for impaired skin integrity will decrease Outcome: Progressing

## 2021-01-28 NOTE — H&P (View-Only) (Signed)
Progress Note  Patient Name: Michele Green Date of Encounter: 01/28/2021  [facility] Cardiologist: Sinclair Grooms, MD   Subjective   Feeling tired. Not feeling well and more short of breath.  Inpatient Medications    Scheduled Meds: . allopurinol  200 mg Oral QPM  . atorvastatin  20 mg Oral Daily  . Chlorhexidine Gluconate Cloth  6 each Topical Daily  . feeding supplement  1 Container Oral BID BM  . hydrocortisone sod succinate (SOLU-CORTEF) inj  25 mg Intravenous Q8H  . mouth rinse  15 mL Mouth Rinse BID  . mesalamine  250 mg Oral BID  . midodrine  2.5 mg Oral BID WC  . multivitamin with minerals  1 tablet Oral Daily  . Rivaroxaban  15 mg Oral QPC supper  . saccharomyces boulardii  250 mg Oral BID  . sodium chloride flush  10-40 mL Intracatheter Q12H  . tamoxifen  20 mg Oral Daily  . thiamine  100 mg Oral Daily   Continuous Infusions: . amiodarone 60 mg/hr (01/28/21 0851)   Followed by  . amiodarone    . cefTRIAXone (ROCEPHIN)  IV Stopped (01/27/21 1900)   PRN Meds: acetaminophen **OR** acetaminophen, ALPRAZolam, HYDROcodone-acetaminophen, lip balm, loperamide, sodium chloride flush   Vital Signs    Vitals:   01/28/21 0215 01/28/21 0400 01/28/21 0838 01/28/21 0947  BP: 107/79 114/74 126/77 (!) 141/84  Pulse: (!) 138 (!) 120 (!) 114 98  Resp:   20   Temp:  97.6 F (36.4 C) (!) 97.4 F (36.3 C)   TempSrc:  Oral Oral   SpO2: 98% 98% 97%   Weight:      Height:        Intake/Output Summary (Last 24 hours) at 01/28/2021 1036 Last data filed at 01/28/2021 0600 Gross per 24 hour  Intake 932 ml  Output 900 ml  Net 32 ml   Last 3 Weights 01/24/2021 01/20/2021 01/19/2021  Weight (lbs) 129 lb 10.1 oz 134 lb 14.7 oz 132 lb 4.4 oz  Weight (kg) 58.8 kg 61.2 kg 60 kg      Telemetry    Atrial tachycardia.  Rate 115 bpm. Previously atrial fibrillation 140s.  Personally Reviewed  ECG    n/a - Personally Reviewed  Physical Exam   VS:  BP (!) 141/84   Pulse  98   Temp (!) 97.4 F (36.3 C) (Oral)   Resp 20   Ht 5' 4"  (1.626 m)   Wt 58.8 kg   SpO2 97%   BMI 22.25 kg/m  , BMI Body mass index is 22.25 kg/m. GENERAL:  Well appearing HEENT: Pupils equal round and reactive, fundi not visualized, oral mucosa unremarkable NECK:  No jugular venous distention, waveform within normal limits, carotid upstroke brisk and symmetric, no bruits LUNGS:  Clear to auscultation bilaterally HEART: Tachycardic.  Regular.  PMI not displaced or sustained,S1 and S2 within normal limits, no S3, no S4, no clicks, no rubs, no murmurs ABD:  Flat, positive bowel sounds normal in frequency in pitch, no bruits, no rebound, no guarding, no midline pulsatile mass, no hepatomegaly, no splenomegaly EXT:  2 plus pulses throughout, 2+ LE edema, no cyanosis no clubbing SKIN:  No rashes no nodules NEURO:  Cranial nerves II through XII grossly intact, motor grossly intact throughout PSYCH:  Cognitively intact, oriented to person place and time  Labs    High Sensitivity Troponin:   Recent Labs  Lab 01/16/21 1909 01/17/21 0524 01/24/21 1522 01/24/21 1707  TROPONINIHS 31* 30* 25* 24*      Chemistry Recent Labs  Lab 01/26/21 0749 01/27/21 0458 01/28/21 0610  NA 139 139 139  K 3.4* 3.9 3.7  CL 107 110 109  CO2 25 25 25   GLUCOSE 124* 131* 89  BUN 12 12 11   CREATININE 0.50 0.56 0.52  CALCIUM 7.7* 7.8* 7.8*  PROT 4.9* 4.8* 4.8*  ALBUMIN 2.2* 2.2* 2.3*  AST 25 35 27  ALT 30 37 35  ALKPHOS 46 47 45  BILITOT 1.0 0.6 0.7  GFRNONAA >60 >60 >60  ANIONGAP 7 4* 5     Hematology Recent Labs  Lab 01/26/21 0749 01/27/21 0458 01/28/21 0610  WBC 6.5 10.1 12.0*  RBC 2.52* 2.35* 3.26*  HGB 7.6* 7.2* 9.9*  HCT 25.1* 23.1* 30.9*  MCV 99.6 98.3 94.8  MCH 30.2 30.6 30.4  MCHC 30.3 31.2 32.0  RDW 15.5 15.8* 17.0*  PLT 120* 132* 121*    BNP Recent Labs  Lab 01/24/21 1610  BNP 478.7*     DDimer No results for input(s): DDIMER in the last 168 hours.   Radiology     No results found.  Cardiac Studies   Echo 01/17/21:  1. s/p Mitral Valve Repair. Procedure Date: 08/10/2018.  2. Right ventricular systolic function is normal. The right ventricular  size is normal. There is moderately elevated pulmonary artery systolic  pressure. The estimated right ventricular systolic pressure is 81.8 mmHg.  3. Left atrial size was severely dilated.  4. The aortic valve is tricuspid. Aortic valve regurgitation is not  visualized. No aortic stenosis is present.  5. Tricuspid valve regurgitation is moderate.  6. Left ventricular ejection fraction, by estimation, is 60 to 65%. The  left ventricle has normal function. The left ventricle has no regional  wall motion abnormalities. Left ventricular diastolic parameters are  indeterminate.  7. The inferior vena cava is normal in size with <50% respiratory  variability, suggesting right atrial pressure of 8 mmHg.   Patient Profile     80 y.o. female with CAD status post CABG, mitral vegetation status post repair, atrial fibrillation status post maze, recurrent atrial fibrillation, hypertension, hyperlipidemia, and chronic hypoxic respiratory failure on home O2 admitted with acute on chronic hypoxia and found to be in atrial fibrillation with RVR.  Assessment & Plan    # Persistent atrial fibrillation: # atrial tachycardia: Ms. Vora has struggled to maintain sinus rhythm.  She has undergone a MAZE procedure and was back in atrial fibrillation this admission.  Digoxin levels were elevated so it has been discontinued.  She only received 1 dose in the hospital.  The digoxin level drawn on 3/9 was drawn immediately after getting IV digoxin and therefore is not accurate.  Diltiazem was ineffective at controlling her heart rate yesterday and titration of the dose has been difficult due to hypotension.  She has been started back on IV amiodarone and is back in atrial tachycardia.  We will plan for cardioversion tomorrow.   She has not missed any doses of her Xarelto.  We will have her remain at [facility] so that she can be evaluated by electrophysiology as needed.   # Acute diastolic heart failure:  She is more volume overloaded and dyspneic today.  This is likely because her heart rates were elevated yesterday.  She received 1 dose of Lasix 20 mg IV and was still net positive.  Increase to 40 mg IV twice daily.  # CAD s/p CABG:  Not an  active issue.  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      For questions or updates, please contact [place] Please consult [email or web address] for contact info under        Signed, Skeet Latch, MD  01/28/2021, 10:36 AM

## 2021-01-28 NOTE — Progress Notes (Signed)
On call notified of elevated heart rate. Orders noted. Will cont to assess CV status.

## 2021-01-29 ENCOUNTER — Inpatient Hospital Stay (HOSPITAL_COMMUNITY): Payer: Medicare Other | Admitting: Certified Registered"

## 2021-01-29 ENCOUNTER — Encounter (HOSPITAL_COMMUNITY): Payer: Self-pay | Admitting: Internal Medicine

## 2021-01-29 ENCOUNTER — Encounter (HOSPITAL_COMMUNITY)
Admission: EM | Disposition: A | Discharge status: Home / Self Care | Payer: Self-pay | Source: Skilled Nursing Facility | Attending: Internal Medicine

## 2021-01-29 DIAGNOSIS — I4891 Unspecified atrial fibrillation: Secondary | ICD-10-CM | POA: Diagnosis not present

## 2021-01-29 DIAGNOSIS — I471 Supraventricular tachycardia: Secondary | ICD-10-CM | POA: Diagnosis not present

## 2021-01-29 HISTORY — PX: CARDIOVERSION: SHX1299

## 2021-01-29 LAB — CBC
HCT: 30.1 % — ABNORMAL LOW (ref 36.0–46.0)
Hemoglobin: 9.4 g/dL — ABNORMAL LOW (ref 12.0–15.0)
MCH: 29.8 pg (ref 26.0–34.0)
MCHC: 31.2 g/dL (ref 30.0–36.0)
MCV: 95.6 fL (ref 80.0–100.0)
Platelets: 113 10*3/uL — ABNORMAL LOW (ref 150–400)
RBC: 3.15 MIL/uL — ABNORMAL LOW (ref 3.87–5.11)
RDW: 16.5 % — ABNORMAL HIGH (ref 11.5–15.5)
WBC: 9 10*3/uL (ref 4.0–10.5)
nRBC: 0 % (ref 0.0–0.2)

## 2021-01-29 LAB — CULTURE, BLOOD (ROUTINE X 2)
Culture: NO GROWTH
Special Requests: ADEQUATE

## 2021-01-29 LAB — COMPREHENSIVE METABOLIC PANEL
ALT: 59 U/L — ABNORMAL HIGH (ref 0–44)
AST: 57 U/L — ABNORMAL HIGH (ref 15–41)
Albumin: 2.2 g/dL — ABNORMAL LOW (ref 3.5–5.0)
Alkaline Phosphatase: 62 U/L (ref 38–126)
Anion gap: 8 (ref 5–15)
BUN: 15 mg/dL (ref 8–23)
CO2: 27 mmol/L (ref 22–32)
Calcium: 7.8 mg/dL — ABNORMAL LOW (ref 8.9–10.3)
Chloride: 102 mmol/L (ref 98–111)
Creatinine, Ser: 0.73 mg/dL (ref 0.44–1.00)
GFR, Estimated: >60 mL/min (ref >60)
Glucose, Bld: 111 mg/dL — ABNORMAL HIGH (ref 70–99)
Potassium: 3.1 mmol/L — ABNORMAL LOW (ref 3.5–5.1)
Sodium: 137 mmol/L (ref 135–145)
Total Bilirubin: 0.6 mg/dL (ref 0.3–1.2)
Total Protein: 4.6 g/dL — ABNORMAL LOW (ref 6.5–8.1)

## 2021-01-29 LAB — MAGNESIUM: Magnesium: 1.6 mg/dL — ABNORMAL LOW (ref 1.7–2.4)

## 2021-01-29 LAB — T4, FREE: Free T4: 1.68 ng/dL — ABNORMAL HIGH (ref 0.61–1.12)

## 2021-01-29 SURGERY — CARDIOVERSION
Anesthesia: General

## 2021-01-29 MED ORDER — LIDOCAINE 2% (20 MG/ML) 5 ML SYRINGE
INTRAMUSCULAR | Status: DC | PRN
  Administered 2021-01-29: 60 mg via INTRAVENOUS

## 2021-01-29 MED ORDER — AMIODARONE HCL IN DEXTROSE 360-4.14 MG/200ML-% IV SOLN
30.0000 mg/h | INTRAVENOUS | Status: DC
  Administered 2021-01-29 – 2021-02-01 (×6): 30 mg/h via INTRAVENOUS
  Filled 2021-01-29 (×7): qty 200

## 2021-01-29 MED ORDER — AMIODARONE LOAD VIA INFUSION
150.0000 mg | Freq: Once | INTRAVENOUS | Status: AC
  Administered 2021-01-29: 150 mg via INTRAVENOUS
  Filled 2021-01-29: qty 83.34

## 2021-01-29 MED ORDER — HYDROXYZINE HCL 25 MG PO TABS: 25.0000 mg | ORAL_TABLET | Freq: Every day | ORAL | Status: DC

## 2021-01-29 MED ORDER — POTASSIUM CHLORIDE 10 MEQ/100ML IV SOLN: 10.0000 meq | INTRAVENOUS | Status: DC

## 2021-01-29 MED ORDER — PROPOFOL 10 MG/ML IV BOLUS
INTRAVENOUS | Status: DC | PRN
  Administered 2021-01-29: 40 mg via INTRAVENOUS

## 2021-01-29 MED ORDER — POTASSIUM CHLORIDE CRYS ER 20 MEQ PO TBCR
40.0000 meq | EXTENDED_RELEASE_TABLET | ORAL | Status: AC
  Administered 2021-01-29 (×2): 40 meq via ORAL
  Filled 2021-01-29 (×2): qty 2

## 2021-01-29 MED ORDER — HYDROXYZINE HCL 25 MG PO TABS
25.0000 mg | ORAL_TABLET | Freq: Once | ORAL | Status: AC
  Administered 2021-01-29: 25 mg via ORAL
  Filled 2021-01-29: qty 1

## 2021-01-29 MED ORDER — HYDROXYZINE HCL 25 MG PO TABS
25.0000 mg | ORAL_TABLET | Freq: Two times a day (BID) | ORAL | Status: DC
  Administered 2021-01-29 – 2021-02-02 (×10): 25 mg via ORAL
  Filled 2021-01-29 (×10): qty 1

## 2021-01-29 MED ORDER — AMIODARONE HCL IN DEXTROSE 360-4.14 MG/200ML-% IV SOLN
60.0000 mg/h | INTRAVENOUS | Status: AC
  Administered 2021-01-29: 60 mg/h via INTRAVENOUS
  Filled 2021-01-29: qty 200

## 2021-01-29 MED ORDER — POTASSIUM CHLORIDE CRYS ER 20 MEQ PO TBCR: 40.0000 meq | EXTENDED_RELEASE_TABLET | Freq: Once | ORAL | Status: DC

## 2021-01-29 MED ORDER — AMIODARONE HCL 200 MG PO TABS
200.0000 mg | ORAL_TABLET | Freq: Two times a day (BID) | ORAL | Status: DC
  Administered 2021-01-29: 200 mg via ORAL
  Filled 2021-01-29: qty 1

## 2021-01-29 MED ORDER — SODIUM CHLORIDE 0.9 % IV SOLN: INTRAVENOUS | Status: DC | PRN

## 2021-01-29 MED ORDER — MAGNESIUM SULFATE 4 GM/100ML IV SOLN
4.0000 g | INTRAVENOUS | Status: AC
  Administered 2021-01-29: 4 g via INTRAVENOUS
  Filled 2021-01-29 (×2): qty 100

## 2021-01-29 NOTE — Progress Notes (Signed)
   Called by RN with patient's HR in the 130. Underwent successful DCCV this afternoon with HR in the 70s NSR. EKG done shows she is back in atrial tachycardia. Discussed with MD, will switch back over to IV amiodarone with 160m bolus and DC oral dosing. Will ask for EP evaluation in the morning for further treatment options. Updated patient/RN on plan.  SBarnet Pall NP-C 01/29/2021, 6:54 PM Pager: [phone]

## 2021-01-29 NOTE — Progress Notes (Signed)
OT Cancellation Note  Patient Details Name: AVILYN VIRTUE MRN: [id number] DOB: 1941-02-22   Cancelled Treatment:    Reason Eval/Treat Not Completed: Medical issues which prohibited therapy. HR sustained in the 140s. Patient to be transferred to [facility] for cardioversion today. Will hold and OT will f/u as able and when patient hemodynamically stable.  Jaquila Santelli L Linsay Vogt 01/29/2021, 9:29 AM

## 2021-01-29 NOTE — Progress Notes (Signed)
PT Cancellation Note  Patient Details Name: ANZLEIGH SLAVEN MRN: [id number] DOB: [birth year]   Cancelled Treatment:    Reason Eval/Treat Not Completed: Medical issues which prohibited therapy, planning CV today. Will check back another day.   Claretha Cooper 01/29/2021, 7:15 AM  [place] Pager [phone] Office [phone]

## 2021-01-29 NOTE — TOC Progression Note (Signed)
Transition of Care [facility]) - Progression Note    Patient Details  Name: Michele Green MRN: [id number] Date of Birth: 15-Jul-1941  Transition of Care [facility]) CM/SW [place], [place] Phone Number: 01/29/2021, 3:56 PM  Clinical Narrative:    3:30pm: CSW spoke with pt son about clarification of discharge plan, son does not want a SNF but wants his mother to return to [place] she was previously from for Hospice care.    Expected Discharge Plan: Assisted Living Barriers to Discharge: No Barriers Identified  Expected Discharge Plan and Services Expected Discharge Plan: Assisted Living   Discharge Planning Services: CM Consult   Living arrangements for the past 2 months: Assisted Living Facility                                       Social Determinants of Health (SDOH) Interventions    Readmission Risk Interventions Readmission Risk Prevention Plan 01/19/2021  Medication Review (RN Care Manager) Complete  PCP or Specialist appointment within 3-5 days of discharge Complete  HRI or Home Care Consult Complete  SW Recovery Care/Counseling Consult Complete  Palliative Care Screening Complete  Skilled Nursing Facility Complete  Some recent data might be hidden

## 2021-01-29 NOTE — Care Management Important Message (Signed)
Important Message  Patient Details IM Letter given to the Patient. Name: Michele Green MRN: [id number] Date of Birth: 11/30/1940   Medicare Important Message Given:  Yes     Kerin Salen 01/29/2021, 10:55 AM

## 2021-01-29 NOTE — Interval H&P Note (Signed)
History and Physical Interval Note:  01/29/2021 11:43 AM  [name]  has presented today for surgery, with the diagnosis of afib.  The various methods of treatment have been discussed with the patient and family. After consideration of risks, benefits and other options for treatment, the patient has consented to  Procedure(s): CARDIOVERSION (N/A) as a surgical intervention.  The patient's history has been reviewed, patient examined, no change in status, stable for surgery.  I have reviewed the patient's chart and labs.  Questions were answered to the patient's satisfaction.     Donato Heinz

## 2021-01-29 NOTE — Transfer of Care (Signed)
Immediate Anesthesia Transfer of Care Note  Patient: Michele Green  Procedure(s) Performed: CARDIOVERSION (N/A )  Patient Location: Endoscopy Unit  Anesthesia Type:General  Level of Consciousness: drowsy  Airway & Oxygen Therapy: Patient Spontanous Breathing and Patient connected to nasal cannula oxygen  Post-op Assessment: Report given to RN and Post -op Vital signs reviewed and stable  Post vital signs: Reviewed and stable  Last Vitals:  Vitals Value Taken Time  BP    Temp    Pulse 74 01/29/21 1202  Resp 15 01/29/21 1202  SpO2 100 % 01/29/21 1202  Vitals shown include unvalidated device data.  Last Pain:  Vitals:   01/29/21 1129  TempSrc: Temporal  PainSc: 0-No pain      Patients Stated Pain Goal: 0 (38/70/65 8260)  Complications: No complications documented.

## 2021-01-29 NOTE — Anesthesia Preprocedure Evaluation (Signed)
Anesthesia Evaluation    Airway Mallampati: II  TM Distance: >3 FB Neck ROM: Full    Dental no notable dental hx. (+) Dental Advisory Given   Pulmonary former smoker,    Pulmonary exam normal breath sounds clear to auscultation       Cardiovascular hypertension, Pt. on medications and Pt. on home beta blockers + CAD and + CABG  Normal cardiovascular exam+ dysrhythmias Atrial Fibrillation + Valvular Problems/Murmurs (s/p mitral valve repair)  Rhythm:Irregular Rate:Tachycardia  ECG: A-flutter, rate 87  ECHO: LV EF: 60% -   65% S/P mitral valve annuloplasty with 28 mm Memo 4D ring. Normal LV EF, trivial MR. Mild-moderate TR with RV-RA gradient of 37 mmHg. Trivial posterior pericardial effusion.   Neuro/Psych negative neurological ROS  negative psych ROS   GI/Hepatic Neg liver ROS, PUD, Ulcerative colitis    Endo/Other  negative endocrine ROS  Renal/GU negative Renal ROS  negative genitourinary   Musculoskeletal Gout   Abdominal Normal abdominal exam  (+)   Peds  Hematology  (+) anemia ,   Anesthesia Other Findings    Reproductive/Obstetrics                           Anesthesia Physical  Anesthesia Plan  ASA: II  Anesthesia Plan: General   Post-op Pain Management:    Induction:   PONV Risk Score and Plan: Propofol infusion and Treatment may vary due to age or medical condition  Airway Management Planned: Mask, Nasal Cannula, Natural Airway and Simple Face Mask  Additional Equipment: None  Intra-op Plan:   Post-operative Plan:   Informed Consent:   Plan Discussed with:   Anesthesia Plan Comments:        Anesthesia Quick Evaluation

## 2021-01-29 NOTE — Plan of Care (Signed)
  Problem: Education: Goal: Knowledge of General Education information will improve Description: Including pain rating scale, medication(s)/side effects and non-pharmacologic comfort measures Outcome: Progressing   Problem: Coping: Goal: Level of anxiety will decrease Outcome: Progressing   Problem: Pain Managment: Goal: General experience of comfort will improve Outcome: Progressing   

## 2021-01-29 NOTE — Anesthesia Postprocedure Evaluation (Signed)
Anesthesia Post Note  Patient: Michele Green  Procedure(s) Performed: CARDIOVERSION (N/A )     Patient location during evaluation: PACU Anesthesia Type: General Level of consciousness: awake and alert Pain management: pain level controlled Vital Signs Assessment: post-procedure vital signs reviewed and stable Respiratory status: spontaneous breathing, nonlabored ventilation and respiratory function stable Cardiovascular status: blood pressure returned to baseline and stable Postop Assessment: no apparent nausea or vomiting Anesthetic complications: no   No complications documented.  Last Vitals:  Vitals:   01/29/21 1212 01/29/21 1220  BP: 114/67 (!) 117/56  Pulse: 70 78  Resp: 18 18  Temp:    SpO2: 100% 100%    Last Pain:  Vitals:   01/29/21 1220  TempSrc:   PainSc: 0-No pain                 Merlinda [name]

## 2021-01-29 NOTE — Progress Notes (Signed)
Nutrition Follow-up  DOCUMENTATION CODES:   Not applicable  INTERVENTION:   -Continue Boost Breeze po BID, each supplement provides 250 kcal and 9 grams of protein -Continue Magic cup BID with meals, each supplement provides 290 kcal and 9 grams of protein -Continue MVI with minerals daily  NUTRITION DIAGNOSIS:   Increased nutrient needs related to acute illness,chronic illness as evidenced by estimated needs.  Ongoing  GOAL:   Patient will meet greater than or equal to 90% of their needs  Progressing   MONITOR:   PO intake,Supplement acceptance,Labs,Weight trends  REASON FOR ASSESSMENT:   Consult Assessment of nutrition requirement/status  ASSESSMENT:   80 y.o. female with medical history of afib, breast cancer, CAD, mitral valve repair s/p Maze procedure, ulcerative colitis, and chronic hypoxic respiratory failure. She was dx with COVID-19 IN 11/2020. After admission for COVID, she discharged to SNF. Patient presented to the ED due to feeling poorly and being noted to have low O2 sats. In the ED, she was found to be in afib with RVR and was hypotensive.  3/10- s/p cardioversion  Reviewed I/O's: -2 L x 24 hours and +1.4 L since admission  UOP: 3 L x 24 hours  Pt unavailable at time of visit.   Pt with good appetite. Noted meal completion 100%. She is consuming Boost Breeze supplements per MAR.  Medications reviewed and include lasix, solu-medrol, amiodarone, and thiamine.   Per TOC notes,plan to d/c back to [place] once medically stable.   Labs reviewed: K: 3.1.   Diet Order:   Diet Order    None      EDUCATION NEEDS:   Not appropriate for education at this time  Skin:  Skin Assessment: Skin Integrity Issues: Skin Integrity Issues:: Other (Comment) Stage I: L heel Other: skin tears to rt knee and lt wrist  Last BM:  01/29/21  Height:   Ht Readings from Last 1 Encounters:  01/29/21 5' 4"  (1.626 m)    Weight:   Wt Readings from Last 1  Encounters:  01/29/21 58.4 kg    Ideal Body Weight:  54.54 kg  BMI:  Body mass index is 22.1 kg/m.  Estimated Nutritional Needs:   Kcal:  1700-1900 kcal  Protein:  80-95 grams  Fluid:  >/= 1.8 L/day    Loistine Chance, RD, LDN, CDCES Registered Dietitian II Certified Diabetes Care and Education Specialist Please refer to [facility] for RD and/or RD on-call/weekend/after hours pager

## 2021-01-29 NOTE — Anesthesia Procedure Notes (Signed)
Procedure Name: General with mask airway Date/Time: 01/29/2021 11:50 AM Performed by: Imagene Riches, CRNA Pre-anesthesia Checklist: Patient identified, Emergency Drugs available, Suction available, Patient being monitored and Timeout performed Patient Re-evaluated:Patient Re-evaluated prior to induction Oxygen Delivery Method: Ambu bag Preoxygenation: Pre-oxygenation with 100% oxygen

## 2021-01-29 NOTE — Progress Notes (Signed)
PROGRESS NOTE   Michele Green  XQJ:[id number]    DOB: 08-17-1941    DOA: 01/24/2021  PCP: [name], MD   I have briefly reviewed patients previous medical records in [facility].  Chief complaint on admission: Dysuria  Brief Narrative:  80 year old female with medical history not limited to atrial fibrillation, breast cancer, coronary artery disease, mitral valve repair s/p Maze procedure, ulcerative colitis, chronic respiratory failure with hypoxia, Covid 19 infection in January 2022, recently discharged from the hospital on January 21, 2021 after management for hypotension and A. fib with elevated digoxin level.  She was discharged to SNF for short-term rehab.  She returned with complaints of feeling poorly and low oxygen levels.  She was admitted for A. fib with RVR along with hypotension.  Cardiology assisting with difficult to control A. fib with RVR.  Patient could not transfer from [facility] to [facility] on 3/9 due to lack of beds, transferring today 3/10 for possible DCCV and patient to remain at [facility] for further management by cardiology/EP cardiology for complex difficult to control A. fib/atrial tachycardia.   Assessment & Plan:  Active Problems:   On continuous oral anticoagulation   Persistent atrial fibrillation (HCC)   Breast cancer of upper-outer quadrant of left female breast (HCC)   Chronic diastolic HF (heart failure) (HCC)   Atrial fibrillation with RVR (HCC)   Hypotension   Elevated troponin   Ulcerative colitis (HCC)   Adrenal insufficiency (Addison's disease) (HCC)   Anemia of chronic disease   Functional quadriplegia (HCC)   Atrial tachycardia (HCC)   Persistent A. fib with RVR/atrial tachycardia  Previously on metoprolol, diltiazem and digoxin.  However during prior hospitalization she had elevated digoxin levels and this was discontinued.  Admitted again for A. fib with RVR.  She was initially on diltiazem infusion which  was ineffective at controlling heart rate and titration of dose was complicated by hypotension and hence diltiazem was discontinued.  Midodrine and stress dose steroids were initiated.  Hypotension resolved.  Cardiology consulted and continue to follow.  The digoxin level drawn on 3/9 was drawn immediately after getting IV digoxin and therefore inaccurate.  Ongoing issues with A. fib/atrial tachycardia with rates up to 140s albeit asymptomatic.  Cardiology aware.  This happened again overnight 3/9 and required an additional bolus of IV amiodarone.  Remains on Xarelto.  Telemetry seems to show sinus tachycardia this morning in the 110s-120s although concerned that she may go back into atrial tachycardia/A. fib with RVR.  Thereby still appropriate for transfer to [facility] for evaluation by EP cardiology.  Cardiology to determine need for DCCV  Adrenal insufficiency with hypotension  Currently on stress dose steroids/IV hydrocortisone 25 mg every 8 hourly.  Hydrocortisone being gradually tapered down.  Discontinued midodrine.  On reviewing her home medications today, she was on a 3-week prednisone taper with last dose on 3/5.  Patient states that she is not chronically on steroids.  However since this was a 21-day course, would continue to gradually taper IV hydrocortisone and eventually discontinue.  Acute on chronic diastolic CHF  Likely precipitated by tachyarrhythmia.  Continue IV Lasix 40 mg twice daily.  Volume status is better.  Management per cardiology.  Anxiety  Appears to be situational.  On hydroxyzine at home.  Discontinued benzodiazepines.  Loose stools  Abdomen is benign.  Could be related to medications.  Patient does have a history of ulcerative colitis.  Imodium as needed.  Per patient  no blood noted in the stool.  Patient denies history of loose stools PTA and only started since hospital admission.  Right elbow pain and swelling/for joint infection/history of  gout  Patient noted to be on allopurinol.  Uric acid level was only 3.7.    Joint effusion was noted on imaging studies.  WBC was noted to be elevated.    Seen by orthopedics.  Appreciate their input.  Conservative management is recommended at this time.  No need for arthrocentesis per orthopedics.    Appears to have resolved.  History of ulcerative colitis  Could be the reason why she has required steroids previously.  Loose stools as discussed above.  Noted to be on mesalamine which is being continued.  Mildly elevated troponin/history of mitral valve repair/maze procedure  Elevated troponin likely due to tachycardia.  Patient does not have any anginal symptoms currently.  Last echocardiogram was in February 2022 which showed normal left ventricular systolic function without any wall motion abnormalities.  Right ventricular systolic pressure was noted to be elevated.   Concern for recurrent UTI  Patient continued on ceftriaxone (day 5/7 on 3/10) due to persistent dysuria.  Urine culture with multiple species and hence not helpful.  Patient continues to have dysuria although it is better than before.  Procalcitonin levels have been improving.  Complete full course due to immunocompromise state.  History of breast cancer  Stable.  Tamoxifen being continued.  Anemia of chronic disease/thrombocytopenia  Drop in hemoglobin appeared to be dilutional.  No evidence of overt bleeding.  Patient did have profound fatigue which could be due to her anemia along with tachycardia.  S/p 1 unit of PRBC.  Anemia panel reviewed.  No clear-cut deficiencies identified.  Hemoglobin up from 7.2-9.9.    Hemoglobin stable  Thrombocytopenia appears chronic and stable.  Functional quadriplegia  PT and OT will be involved.  Recommending SNF when stable.  Dyslipidemia  Continue statin.  Hypokalemia/hypomagnesemia  Replacing aggressively to try and keep potassium >4 and magnesium >2.  Body  mass index is 22.25 kg/m.  Nutritional Status Nutrition Problem: Increased nutrient needs Etiology: acute illness,chronic illness Signs/Symptoms: estimated needs Interventions: Boost Breeze,Magic cup,MVI  Pressure Injury 12/02/20 Anus Medial Stage 1 -  Intact skin with non-blanchable redness of a localized area usually over a bony prominence. (Active)  12/02/20 0030  Location: Anus  Location Orientation: Medial  Staging: Stage 1 -  Intact skin with non-blanchable redness of a localized area usually over a bony prominence.  Wound Description (Comments):   Present on Admission: Yes     Pressure Injury 12/10/20 Buttocks Right;Bilateral Stage 1 -  Intact skin with non-blanchable redness of a localized area usually over a bony prominence. Redness to sacrum and groin (Active)  12/10/20 1600  Location: Buttocks  Location Orientation: Right;Bilateral  Staging: Stage 1 -  Intact skin with non-blanchable redness of a localized area usually over a bony prominence.  Wound Description (Comments): Redness to sacrum and groin  Present on Admission:      Pressure Injury 01/23/21 Heel Left Stage 1 -  Intact skin with non-blanchable redness of a localized area usually over a bony prominence. (Active)  01/23/21 2200  Location: Heel  Location Orientation: Left  Staging: Stage 1 -  Intact skin with non-blanchable redness of a localized area usually over a bony prominence.  Wound Description (Comments):   Present on Admission: Yes        DVT prophylaxis:   Remains on Xarelto anticoagulation.  Code Status: DNR Family Communication: None at bedside Disposition:  Status is: Inpatient  Remains inpatient appropriate because:Hemodynamically unstable   Dispo:  Patient From: [facility]  Planned Disposition: Patient transferring from [facility] to [facility] on 3/10.  Eventually to SNF.  Medically stable for discharge: No  Family communication:  I discussed in  detail with patient's son via phone, updated care and answered all questions.  I advised him that patient has now been transferred to [facility] for further evaluation and management.  He was appreciative of the call.  I have signed out this patient to Dr. Edgardo Roys, [facility] Team 3 today.          Consultants:   Cardiology Orthopedics  Procedures:   None  Antimicrobials:    Anti-infectives (From admission, onward)   Start     Dose/Rate Route Frequency Ordered Stop   01/25/21 1700  cefTRIAXone (ROCEPHIN) 1 g in sodium chloride 0.9 % 100 mL IVPB        1 g 200 mL/hr over 30 Minutes Intravenous Every 24 hours 01/24/21 2105 02/01/21 1659   01/24/21 1745  cefTRIAXone (ROCEPHIN) 1 g in sodium chloride 0.9 % 100 mL IVPB        1 g 200 mL/hr over 30 Minutes Intravenous  Once 01/24/21 1734 01/24/21 1808        Subjective:  Reports 3-4 episodes of loose stools per day, asking something for diarrhea.  Denies diarrhea PTA.  Denies dyspnea or chest pain.  No palpitations even when her heart rate is in the 140s.  Objective:   Vitals:   01/28/21 1900 01/28/21 2029 01/29/21 0356 01/29/21 0926  BP: 125/85 115/71 120/86 (!) 121/93  Pulse:  (!) 107 (!) 138 (!) 140  Resp:  20 19 19   Temp:  97.9 F (36.6 C) 97.9 F (36.6 C) (!) 97.4 F (36.3 C)  TempSrc:  Oral  Oral  SpO2:  97% 100% 100%  Weight:      Height:        General exam: Pleasant elderly female, moderately built and nourished lying comfortably propped up in bed without distress. Respiratory system: Clear to auscultation.  No increased work of breathing. Cardiovascular system: S1 and S2 heard, irregularly irregular and mildly tachycardic.  No JVD or murmurs.  2+ pitting bilateral leg edema.  Telemetry personally reviewed: Appears to have transitioned to sinus tachycardia in the 110s this morning but overnight had been up and down with atrial tachycardia/atrial fib with RVR up to 140s, sustained. Gastrointestinal  system: Abdomen is nondistended, soft and nontender. No organomegaly or masses felt. Normal bowel sounds heard. Central nervous system: Alert and oriented. No focal neurological deficits. Extremities: Symmetric 5 x 5 power. Skin: No rashes, lesions or ulcers Psychiatry: Judgement and insight appear normal. Mood & affect appropriate.     Data Reviewed:   I have personally reviewed following labs and imaging studies   CBC: Recent Labs  Lab 01/24/21 1610 01/25/21 0534 01/26/21 0749 01/27/21 0458 01/28/21 0610 01/29/21 0619  WBC 13.8* 8.6   < > 10.1 12.0* 9.0  NEUTROABS 13.0* 7.9*  --   --   --   --   HGB 8.0* 7.3*   < > 7.2* 9.9* 9.4*  HCT 25.8* 24.4*   < > 23.1* 30.9* 30.1*  MCV 99.2 100.0   < > 98.3 94.8 95.6  PLT 123* 107*   < > 132* 121* 113*   < > =  values in this interval not displayed.    Basic Metabolic Panel: Recent Labs  Lab 01/24/21 1857 01/24/21 1934 01/25/21 0534 01/26/21 0749 01/27/21 0458 01/28/21 0610 01/29/21 0619  NA  --   --  140 139 139 139 137  K  --   --  3.4* 3.4* 3.9 3.7 3.1*  CL  --   --  108 107 110 109 102  CO2  --   --  26 25 25 25 27   GLUCOSE  --   --  118* 124* 131* 89 111*  BUN  --   --  12 12 12 11 15   CREATININE  --   --  0.56 0.50 0.56 0.52 0.73  CALCIUM  --   --  7.8* 7.7* 7.8* 7.8* 7.8*  MG  --    < > 1.8 1.7 2.0  --  1.6*  PHOS 3.2  --  3.2  --   --   --   --    < > = values in this interval not displayed.    Liver Function Tests: Recent Labs  Lab 01/27/21 0458 01/28/21 0610 01/29/21 0619  AST 35 27 57*  ALT 37 35 59*  ALKPHOS 47 45 62  BILITOT 0.6 0.7 0.6  PROT 4.8* 4.8* 4.6*  ALBUMIN 2.2* 2.3* 2.2*    CBG: No results for input(s): GLUCAP in the last 168 hours.  Microbiology Studies:   Recent Results (from the past 240 hour(s))  SARS CORONAVIRUS 2 (TAT 6-24 HRS) Nasopharyngeal Nasopharyngeal Swab     Status: None   Collection Time: 01/20/21  6:35 PM   Specimen: Nasopharyngeal Swab  Result Value Ref Range  Status   SARS Coronavirus 2 NEGATIVE NEGATIVE Final    Comment: (NOTE) SARS-CoV-2 target nucleic acids are NOT DETECTED.  The SARS-CoV-2 RNA is generally detectable in upper and lower respiratory specimens during the acute phase of infection. Negative results do not preclude SARS-CoV-2 infection, do not rule out co-infections with other pathogens, and should not be used as the sole basis for treatment or other patient management decisions. Negative results must be combined with clinical observations, patient history, and epidemiological information. The expected result is Negative.  Fact Sheet for Patients: SugarRoll.be  Fact Sheet for Healthcare Providers: https://www.woods-mathews.com/  This test is not yet approved or cleared by the [place] FDA and  has been authorized for detection and/or diagnosis of SARS-CoV-2 by FDA under an Emergency Use Authorization (EUA). This EUA will remain  in effect (meaning this test can be used) for the duration of the COVID-19 declaration under Se ction 564(b)(1) of the Act, 21 U.S.C. section 360bbb-3(b)(1), unless the authorization is terminated or revoked sooner.  Performed at [facility], [place] [address], [place], [place] [address]   Blood culture (routine x 2)     Status: None (Preliminary result)   Collection Time: 01/24/21  3:13 PM   Specimen: BLOOD  Result Value Ref Range Status   Specimen Description   Final    BLOOD BLOOD LEFT ARM Performed at [facility] [address], [place], [place] [address]    Special Requests   Final    BOTTLES DRAWN AEROBIC AND ANAEROBIC Blood Culture adequate volume Performed at [facility] [address], [place], [place] [address]    Culture   Final    NO GROWTH 3 DAYS Performed at [facility], [place] [address], [place],  [address]    Report Status  PENDING  Incomplete  Blood culture (routine x  2)     Status: None (Preliminary result)   Collection Time: 01/24/21  3:22 PM   Specimen: BLOOD  Result Value Ref Range Status   Specimen Description   Final    BLOOD LEFT ARM Performed at [facility] [address], [place], [place] [address]    Special Requests   Final    BOTTLES DRAWN AEROBIC AND ANAEROBIC Blood Culture adequate volume Performed at [place] [address] [place], [place] [address]    Culture   Final    NO GROWTH 4 DAYS Performed at [facility], [place] [address], [place], [place] [address]    Report Status PENDING  Incomplete  Resp Panel by RT-PCR (Flu A&B, Covid) Nasopharyngeal Swab     Status: None   Collection Time: 01/24/21  5:27 PM   Specimen: Nasopharyngeal Swab; Nasopharyngeal(NP) swabs in vial transport medium  Result Value Ref Range Status   SARS Coronavirus 2 by RT PCR NEGATIVE NEGATIVE Final    Comment: (NOTE) SARS-CoV-2 target nucleic acids are NOT DETECTED.  The SARS-CoV-2 RNA is generally detectable in upper respiratory specimens during the acute phase of infection. The lowest concentration of SARS-CoV-2 viral copies this assay can detect is 138 copies/mL. A negative result does not preclude SARS-Cov-2 infection and should not be used as the sole basis for treatment or other patient management decisions. A negative result may occur with  improper specimen collection/handling, submission of specimen other than nasopharyngeal swab, presence of viral mutation(s) within the areas targeted by this assay, and inadequate number of viral copies(<138 copies/mL). A negative result must be combined with clinical observations, patient history, and epidemiological information. The expected result is Negative.  Fact Sheet for Patients:  EntrepreneurPulse.com.au  Fact Sheet for Healthcare Providers:  IncredibleEmployment.be  This test is no t yet approved or cleared by  the [place] FDA and  has been authorized for detection and/or diagnosis of SARS-CoV-2 by FDA under an Emergency Use Authorization (EUA). This EUA will remain  in effect (meaning this test can be used) for the duration of the COVID-19 declaration under Section 564(b)(1) of the Act, 21 U.S.C.section 360bbb-3(b)(1), unless the authorization is terminated  or revoked sooner.       Influenza A by PCR NEGATIVE NEGATIVE Final   Influenza B by PCR NEGATIVE NEGATIVE Final    Comment: (NOTE) The Xpert Xpress SARS-CoV-2/FLU/RSV plus assay is intended as an aid in the diagnosis of influenza from Nasopharyngeal swab specimens and should not be used as a sole basis for treatment. Nasal washings and aspirates are unacceptable for Xpert Xpress SARS-CoV-2/FLU/RSV testing.  Fact Sheet for Patients: EntrepreneurPulse.com.au  Fact Sheet for Healthcare Providers: IncredibleEmployment.be  This test is not yet approved or cleared by the [place] FDA and has been authorized for detection and/or diagnosis of SARS-CoV-2 by FDA under an Emergency Use Authorization (EUA). This EUA will remain in effect (meaning this test can be used) for the duration of the COVID-19 declaration under Section 564(b)(1) of the Act, 21 U.S.C. section 360bbb-3(b)(1), unless the authorization is terminated or revoked.  Performed at [facility], [place] [address] [place], [place] [address]   Culture, Urine     Status: Abnormal   Collection Time: 01/24/21  5:35 PM   Specimen: Urine, Catheterized  Result Value Ref Range Status   Specimen Description   Final    URINE, CATHETERIZED Performed at [facility], [place] [address], [place], [place] [address]    Special Requests   Final    NONE Performed at [facility], [place] [address] [place], [place] [address]    Culture MULTIPLE SPECIES PRESENT, SUGGEST RECOLLECTION (A)  Final    Report Status 01/26/2021 FINAL  Final     Radiology Studies:  No results found.   Scheduled Meds:   . allopurinol  200 mg Oral QPM  . atorvastatin  20 mg Oral Daily  . Chlorhexidine Gluconate Cloth  6 each Topical Daily  . feeding supplement  1 Container Oral BID BM  . furosemide  40 mg Intravenous BID  . hydrocortisone sod succinate (SOLU-CORTEF) inj  25 mg Intravenous Q8H  . hydrOXYzine  25 mg Oral Daily  . hydrOXYzine  25 mg Oral QHS  . mouth rinse  15 mL Mouth Rinse BID  . mesalamine  250 mg Oral BID  . multivitamin with minerals  1 tablet Oral Daily  . potassium chloride  40 mEq Oral Once  . Rivaroxaban  15 mg Oral QPC supper  . saccharomyces boulardii  250 mg Oral BID  . sodium chloride flush  10-40 mL Intracatheter Q12H  . tamoxifen  20 mg Oral Daily  . thiamine  100 mg Oral Daily    Continuous Infusions:   . amiodarone 30 mg/hr (01/29/21 1040)  . cefTRIAXone (ROCEPHIN)  IV 1 g (01/28/21 1701)  . magnesium sulfate bolus IVPB    . potassium chloride       LOS: 4 days     Vernell Leep, MD, [place], [facility]. [facility]    To contact the attending provider between 7A-7P or the covering provider during after hours 7P-7A, please log into the web site [email or web address] and access using universal [place] password for that web site. If you do not have the password, please call the hospital operator.  01/29/2021, 11:23 AM

## 2021-01-29 NOTE — CV Procedure (Signed)
Procedure:   DCCV  Indication:  Symptomatic atrial fibrillation/atrial tachycardia  Procedure Note:  The patient signed informed consent.  They have had had therapeutic anticoagulation with Xarelto greater than 3 weeks.  Anesthesia was administered by Dr. Elgie Congo.  Adequate airway was maintained throughout and vital followed per protocol.  They were cardioverted x 1 with 100J of biphasic synchronized energy.  They converted to NSR with rate 70s.  There were no apparent complications.  The patient had normal neuro status and respiratory status post procedure with vitals stable as recorded elsewhere.    Follow up:  They will continue on current medical therapy and follow up with cardiology as scheduled.  Oswaldo Milian, MD 01/29/2021 12:04 PM

## 2021-01-30 DIAGNOSIS — I4891 Unspecified atrial fibrillation: Secondary | ICD-10-CM | POA: Diagnosis not present

## 2021-01-30 DIAGNOSIS — E271 Primary adrenocortical insufficiency: Secondary | ICD-10-CM | POA: Diagnosis not present

## 2021-01-30 DIAGNOSIS — C50412 Malignant neoplasm of upper-outer quadrant of left female breast: Secondary | ICD-10-CM | POA: Diagnosis not present

## 2021-01-30 DIAGNOSIS — I959 Hypotension, unspecified: Secondary | ICD-10-CM | POA: Diagnosis not present

## 2021-01-30 LAB — CBC
HCT: 31.4 % — ABNORMAL LOW (ref 36.0–46.0)
Hemoglobin: 10.1 g/dL — ABNORMAL LOW (ref 12.0–15.0)
MCH: 30 pg (ref 26.0–34.0)
MCHC: 32.2 g/dL (ref 30.0–36.0)
MCV: 93.2 fL (ref 80.0–100.0)
Platelets: 111 10*3/uL — ABNORMAL LOW (ref 150–400)
RBC: 3.37 MIL/uL — ABNORMAL LOW (ref 3.87–5.11)
RDW: 16 % — ABNORMAL HIGH (ref 11.5–15.5)
WBC: 8.6 10*3/uL (ref 4.0–10.5)
nRBC: 0 % (ref 0.0–0.2)

## 2021-01-30 LAB — COMPREHENSIVE METABOLIC PANEL
ALT: 57 U/L — ABNORMAL HIGH (ref 0–44)
AST: 43 U/L — ABNORMAL HIGH (ref 15–41)
Albumin: 2.2 g/dL — ABNORMAL LOW (ref 3.5–5.0)
Alkaline Phosphatase: 53 U/L (ref 38–126)
Anion gap: 7 (ref 5–15)
BUN: 9 mg/dL (ref 8–23)
CO2: 35 mmol/L — ABNORMAL HIGH (ref 22–32)
Calcium: 8 mg/dL — ABNORMAL LOW (ref 8.9–10.3)
Chloride: 99 mmol/L (ref 98–111)
Creatinine, Ser: 0.6 mg/dL (ref 0.44–1.00)
GFR, Estimated: >60 mL/min (ref >60)
Glucose, Bld: 119 mg/dL — ABNORMAL HIGH (ref 70–99)
Potassium: 2.7 mmol/L — CL (ref 3.5–5.1)
Sodium: 141 mmol/L (ref 135–145)
Total Bilirubin: 0.5 mg/dL (ref 0.3–1.2)
Total Protein: 4.8 g/dL — ABNORMAL LOW (ref 6.5–8.1)

## 2021-01-30 LAB — CULTURE, BLOOD (ROUTINE X 2)
Culture: NO GROWTH
Special Requests: ADEQUATE

## 2021-01-30 LAB — BASIC METABOLIC PANEL
Anion gap: 8 (ref 5–15)
BUN: 10 mg/dL (ref 8–23)
CO2: 32 mmol/L (ref 22–32)
Calcium: 7.8 mg/dL — ABNORMAL LOW (ref 8.9–10.3)
Chloride: 101 mmol/L (ref 98–111)
Creatinine, Ser: 0.65 mg/dL (ref 0.44–1.00)
GFR, Estimated: >60 mL/min (ref >60)
Glucose, Bld: 125 mg/dL — ABNORMAL HIGH (ref 70–99)
Potassium: 3.6 mmol/L (ref 3.5–5.1)
Sodium: 141 mmol/L (ref 135–145)

## 2021-01-30 LAB — MAGNESIUM: Magnesium: 2.3 mg/dL (ref 1.7–2.4)

## 2021-01-30 MED ORDER — DILTIAZEM HCL ER COATED BEADS 120 MG PO CP24
120.0000 mg | ORAL_CAPSULE | Freq: Every day | ORAL | Status: DC
  Administered 2021-01-30 – 2021-02-01 (×3): 120 mg via ORAL
  Filled 2021-01-30 (×3): qty 1

## 2021-01-30 MED ORDER — PREDNISONE 20 MG PO TABS
20.0000 mg | ORAL_TABLET | Freq: Every day | ORAL | Status: DC
  Administered 2021-01-31 – 2021-02-02 (×3): 20 mg via ORAL
  Filled 2021-01-30 (×3): qty 1

## 2021-01-30 MED ORDER — POTASSIUM CHLORIDE 10 MEQ/100ML IV SOLN
10.0000 meq | INTRAVENOUS | Status: AC
  Administered 2021-01-30 (×4): 10 meq via INTRAVENOUS
  Filled 2021-01-30 (×4): qty 100

## 2021-01-30 MED ORDER — POTASSIUM CHLORIDE CRYS ER 20 MEQ PO TBCR
40.0000 meq | EXTENDED_RELEASE_TABLET | ORAL | Status: DC
  Administered 2021-01-30: 40 meq via ORAL
  Filled 2021-01-30: qty 2

## 2021-01-30 MED ORDER — POTASSIUM CHLORIDE CRYS ER 20 MEQ PO TBCR
40.0000 meq | EXTENDED_RELEASE_TABLET | Freq: Once | ORAL | Status: AC
  Administered 2021-01-30: 40 meq via ORAL
  Filled 2021-01-30: qty 2

## 2021-01-30 MED ORDER — ALPRAZOLAM 0.25 MG PO TABS
0.2500 mg | ORAL_TABLET | Freq: Two times a day (BID) | ORAL | Status: DC | PRN
  Administered 2021-01-30 – 2021-02-01 (×3): 0.25 mg via ORAL
  Filled 2021-01-30 (×3): qty 1

## 2021-01-30 NOTE — Progress Notes (Signed)
Date and time results received: 01/30/21 0400 Test:Potassium Critical Value:2.7 Name of Provider Notified: G. Shalhoub Orders Received? Or Actions Taken?:  No orders at this time

## 2021-01-30 NOTE — Progress Notes (Signed)
Pt HR elevated but trending down from 130s-140s; currently on Amiodarone drip, which is the cause of the downward trend.

## 2021-01-30 NOTE — Progress Notes (Signed)
OT Cancellation Note  Patient Details Name: Michele Green MRN: [id number] DOB: [birth year]   Cancelled Treatment:    Reason Eval/Treat Not Completed: Medical issues which prohibited therapy. Pt remains to have sustained tachycardia. Also, noted 2.7 Potassium level. Plan to reattempt at a later date when pt is more hemodynamically stable.   Tyrone Schimke, OT Acute Rehabilitation Services Pager: [phone] Office: [phone]  01/30/2021, 10:57 AM

## 2021-01-30 NOTE — Progress Notes (Addendum)
PROGRESS NOTE        PATIENT DETAILS Name: Michele Green Age: 80 y.o. Sex: female Date of Birth: [birth year] Admit Date: 01/24/2021 Admitting Physician Bonnielee Haff, MD IOX:BDZH, Nathen May, MD  Brief Narrative: Patient is a 80 y.o. female persistent A. Fib,?  Steroid-induced relative adrenal insufficiency,chronic diastolic heart failure, ulcerative colitis, mitral valve repair/maze procedure, history of breast cancer-just discharged from the hospital on 3/2 after being treated for A. fib/hypotension-brought to the ED on 3/5 with A. fib with RVR associated with hypotension.  She was transferred to [facility] underwent DC cardioversion on 3/10.  See below for further details  Significant events: 1/10-1/20>> hospitalized for Covid pneumonia-hypoxia-A. fib RVR 2/25-3/2>> hospitalized for A. fib RVR-hypotension-steroid induced adrenal insufficiency-d/c'd to SNF 3/5>> admit to [facility] for A. fib with RVR/hypotension 3/10>> transferred to [facility] for DC cardioversion 3/10>> cardioverted-sinus rhythm-but reverted to atrial flutter/atrial tachycardia  Significant studies: 2/26>> random a.m. cortisol 11.7-felt to be disproportionately low-patient hypotensive (see note 2/27) 2/26>> Echo: EF 60-65%, RVSP 52.9 3/5>> chest x-ray: No pneumonia 3/5>> x-ray right elbow: Moderate effusion  Antimicrobial therapy: Rocephin: 3/5>>3/10  Microbiology data: 3/5>> blood culture: No growth 3/5>> urine culture: Multiple species present  Procedures : 3/10>> DC cardioversion  Consults: Cardiology, EP, orthopedics  DVT Prophylaxis : Rivaroxaban (XARELTO) tablet 15 mg    Subjective: Just woke up-mildly confused-but lying comfortably in bed.  Denies any chest pain or shortness of breath.  Easily answers questions when redirected.   Assessment/Plan: Persistent A. fib/a flutter/atrial tachycardia with RVR: Failed DC cardioversion on 3/10-currently on amiodarone  infusion, oral Cardizem-remains on Xarelto.  Cardiology/EP following-Per EP-and not a candidate for ablation-recommending rate control strategy.  Discussed with patient's son-he also does not want to pursue procedures like ambulation/PPM-see goals of care discussion below.    Hypotension: Thought to be due to relative adrenal insufficiency-apparently patient has been on steroids for the past several weeks-BP stabilized-transition to oral prednisone with plans for slow taper.  Relative adrenal insufficiency: Due to significant steroid use over the past several weeks-recently admitted for COVID-19 pneumonia-apparently has been on steroids since then.  Random cortisol level on 3/26 while patient was hypotensive was felt to be disproportionately low-she has not been formally diagnosed with adrenal insufficiency-high suspicion that this is relative adrenal insufficiency due to steroid use-we will attempt a slow taper of steroids for the next several weeks.    Acute on chronic diastolic heart failure: Volume status is improved-on IV Lasix-follow weights/electrolytes.  Complicated UTI: Cultures inconclusive-no further dysuria-given significant steroid use over the past several weeks-felt appropriate to continue empiric Rocephin-suspect 5 days of treatment is appropriate-stop Rocephin today.  Hypokalemia: Replete-recheck electrolytes later this evening  Hypomagnesemia: Repleted-recheck electrolytes tomorrow.  HLD: Continue statin  Anxiety: Continue as needed Xanax.  History of CAD s/p CABG: No anginal symptoms  History of mitral valve repair-Maze procedure  History of breast cancer: On tamoxifen  History of ulcerative colitis: on mesalamine-apparently has had few loose stools intermittently.  Follow  Right elbow effusion: Pain/swelling has resolved-evaluated by orthopedics-recommended conservative management-no need for arthrocentesis per orthopedics.  Deconditioning/debility/functional  quadriplegia: PT/OT following-SNF when more clinically stable.  Goals of care: DNR in place-discussed with patient's son over the phone on 3/11-he does not desire heroic/aggressive measures-does not want to proceed with PPM placement/ablation etc.  Per EP-patient is not a candidate for ablation/pacing.  Per son-goals of care are for gentle medical treatment-and not to pursue procedures etc.  He is aware that if patient deteriorates-we need to involve hospice care.  We will go ahead and place a palliative care consult-for ongoing discussion-and for further delineation of goals of care.  Pressure Injury:    Pressure Injury 01/23/21 Heel Left Stage 1 -  Intact skin with non-blanchable redness of a localized area usually over a bony prominence. (Active)  01/23/21 2200  Location: Heel  Location Orientation: Left  Staging: Stage 1 -  Intact skin with non-blanchable redness of a localized area usually over a bony prominence.  Wound Description (Comments):   Present on Admission: Yes    Diet: Diet Order            Diet Heart Room service appropriate? Yes; Fluid consistency: Thin  Diet effective now                  Code Status: DNR  Family Communication: Spoke with Son-Nelson Hartnett-[phone] over phone on 3/11  Disposition Plan: Status is: Inpatient  Remains inpatient appropriate because:Inpatient level of care appropriate due to severity of illness   Dispo:  Patient From: [facility]  Planned Disposition: To be determined  Medically stable for discharge: No     Barriers to Discharge: A. fib with RVR-on amiodarone infusion  Antimicrobial agents: Anti-infectives (From admission, onward)   Start     Dose/Rate Route Frequency Ordered Stop   01/25/21 1700  cefTRIAXone (ROCEPHIN) 1 g in sodium chloride 0.9 % 100 mL IVPB        1 g 200 mL/hr over 30 Minutes Intravenous Every 24 hours 01/24/21 2105 02/01/21 1659   01/24/21 1745  cefTRIAXone (ROCEPHIN) 1 g in sodium  chloride 0.9 % 100 mL IVPB        1 g 200 mL/hr over 30 Minutes Intravenous  Once 01/24/21 1734 01/24/21 1808       Time spent: 35 minutes-Greater than 50% of this time was spent in counseling, explanation of diagnosis, planning of further management, and coordination of care.  MEDICATIONS: Scheduled Meds: . allopurinol  200 mg Oral QPM  . atorvastatin  20 mg Oral Daily  . Chlorhexidine Gluconate Cloth  6 each Topical Daily  . diltiazem  120 mg Oral Daily  . feeding supplement  1 Container Oral BID BM  . furosemide  40 mg Intravenous BID  . hydrocortisone sod succinate (SOLU-CORTEF) inj  25 mg Intravenous Q8H  . hydrOXYzine  25 mg Oral BID  . mouth rinse  15 mL Mouth Rinse BID  . mesalamine  250 mg Oral BID  . multivitamin with minerals  1 tablet Oral Daily  . Rivaroxaban  15 mg Oral QPC supper  . saccharomyces boulardii  250 mg Oral BID  . sodium chloride flush  10-40 mL Intracatheter Q12H  . tamoxifen  20 mg Oral Daily  . thiamine  100 mg Oral Daily   Continuous Infusions: . amiodarone 30 mg/hr (01/30/21 0902)  . cefTRIAXone (ROCEPHIN)  IV 1 g (01/29/21 1809)  . potassium chloride 10 mEq (01/30/21 1040)   PRN Meds:.acetaminophen **OR** acetaminophen, HYDROcodone-acetaminophen, lip balm, loperamide, sodium chloride flush   PHYSICAL EXAM: Vital signs: Vitals:   01/29/21 1958 01/30/21 0504 01/30/21 0600 01/30/21 0700  BP: 111/67 123/89 130/88 122/87  Pulse: 82 (!) 115 (!) 118 (!) 121  Resp: 16 18 19 19   Temp: 97.8 F (36.6 C) (!) 97.3 F (36.3 C)  97.9 F (36.6  C)  TempSrc:  Oral  Oral  SpO2: 99% 100% 100% 98%  Weight:      Height:       Filed Weights   01/24/21 2115 01/29/21 1129 01/29/21 1258  Weight: 58.8 kg 58.8 kg 58.4 kg   Body mass index is 22.1 kg/m.   Gen Exam:Alert awake-not in any distress-mildly confused but easily redirectable. HEENT:atraumatic, normocephalic Chest: B/L clear to auscultation anteriorly CVS:S1S2  irregular/tachycardic Abdomen:soft non tender, non distended Extremities:+ edema Neurology: Non focal Skin: no rash  I have personally reviewed following labs and imaging studies  LABORATORY DATA: CBC: Recent Labs  Lab 01/24/21 1610 01/25/21 0534 01/26/21 0749 01/27/21 0458 01/28/21 0610 01/29/21 0619 01/30/21 0312  WBC 13.8* 8.6 6.5 10.1 12.0* 9.0 8.6  NEUTROABS 13.0* 7.9*  --   --   --   --   --   HGB 8.0* 7.3* 7.6* 7.2* 9.9* 9.4* 10.1*  HCT 25.8* 24.4* 25.1* 23.1* 30.9* 30.1* 31.4*  MCV 99.2 100.0 99.6 98.3 94.8 95.6 93.2  PLT 123* 107* 120* 132* 121* 113* 111*    Basic Metabolic Panel: Recent Labs  Lab 01/24/21 1857 01/24/21 1934 01/25/21 0534 01/26/21 0749 01/27/21 0458 01/28/21 0610 01/29/21 0619 01/30/21 0312  NA  --   --  140 139 139 139 137 141  K  --   --  3.4* 3.4* 3.9 3.7 3.1* 2.7*  CL  --   --  108 107 110 109 102 99  CO2  --   --  26 25 25 25 27  35*  GLUCOSE  --   --  118* 124* 131* 89 111* 119*  BUN  --   --  12 12 12 11 15 9   CREATININE  --   --  0.56 0.50 0.56 0.52 0.73 0.60  CALCIUM  --   --  7.8* 7.7* 7.8* 7.8* 7.8* 8.0*  MG  --    < > 1.8 1.7 2.0  --  1.6* 2.3  PHOS 3.2  --  3.2  --   --   --   --   --    < > = values in this interval not displayed.    GFR: Estimated Creatinine Clearance: 46.8 mL/min (by C-G formula based on SCr of 0.6 mg/dL).  Liver Function Tests: Recent Labs  Lab 01/26/21 0749 01/27/21 0458 01/28/21 0610 01/29/21 0619 01/30/21 0312  AST 25 35 27 57* 43*  ALT 30 37 35 59* 57*  ALKPHOS 46 47 45 62 53  BILITOT 1.0 0.6 0.7 0.6 0.5  PROT 4.9* 4.8* 4.8* 4.6* 4.8*  ALBUMIN 2.2* 2.2* 2.3* 2.2* 2.2*   No results for input(s): LIPASE, AMYLASE in the last 168 hours. No results for input(s): AMMONIA in the last 168 hours.  Coagulation Profile: Recent Labs  Lab 01/24/21 1934 01/28/21 2000  INR 1.5* 1.5*    Cardiac Enzymes: Recent Labs  Lab 01/24/21 1934  CKTOTAL 10*    BNP (last 3 results) No results  for input(s): PROBNP in the last 8760 hours.  Lipid Profile: No results for input(s): CHOL, HDL, LDLCALC, TRIG, CHOLHDL, LDLDIRECT in the last 72 hours.  Thyroid Function Tests: Recent Labs    01/29/21 0619  FREET4 1.68*    Anemia Panel: No results for input(s): VITAMINB12, FOLATE, FERRITIN, TIBC, IRON, RETICCTPCT in the last 72 hours.  Urine analysis:    Component Value Date/Time   COLORURINE YELLOW 01/24/2021 1507   APPEARANCEUR HAZY (A) 01/24/2021 1507   LABSPEC 1.015 01/24/2021  [place] 5.0 01/24/2021 [place] 01/24/2021 1507   HGBUR NEGATIVE 01/24/2021 [place] 01/24/2021 1507   KETONESUR NEGATIVE 01/24/2021 1507   PROTEINUR NEGATIVE 01/24/2021 1507   NITRITE NEGATIVE 01/24/2021 1507   LEUKOCYTESUR TRACE (A) 01/24/2021 1507    Sepsis Labs: Lactic Acid, Venous    Component Value Date/Time   LATICACIDVEN 1.5 01/24/2021 1707    MICROBIOLOGY: Recent Results (from the past 240 hour(s))  SARS CORONAVIRUS 2 (TAT 6-24 HRS) Nasopharyngeal Nasopharyngeal Swab     Status: None   Collection Time: 01/20/21  6:35 PM   Specimen: Nasopharyngeal Swab  Result Value Ref Range Status   SARS Coronavirus 2 NEGATIVE NEGATIVE Final    Comment: (NOTE) SARS-CoV-2 target nucleic acids are NOT DETECTED.  The SARS-CoV-2 RNA is generally detectable in upper and lower respiratory specimens during the acute phase of infection. Negative results do not preclude SARS-CoV-2 infection, do not rule out co-infections with other pathogens, and should not be used as the sole basis for treatment or other patient management decisions. Negative results must be combined with clinical observations, patient history, and epidemiological information. The expected result is Negative.  Fact Sheet for Patients: SugarRoll.be  Fact Sheet for Healthcare Providers: https://www.woods-mathews.com/  This test is not yet  approved or cleared by the [place] FDA and  has been authorized for detection and/or diagnosis of SARS-CoV-2 by FDA under an Emergency Use Authorization (EUA). This EUA will remain  in effect (meaning this test can be used) for the duration of the COVID-19 declaration under Se ction 564(b)(1) of the Act, 21 U.S.C. section 360bbb-3(b)(1), unless the authorization is terminated or revoked sooner.  Performed at [facility], [place] [address], [place], [place] [address]   Blood culture (routine x 2)     Status: None (Preliminary result)   Collection Time: 01/24/21  3:13 PM   Specimen: BLOOD  Result Value Ref Range Status   Specimen Description   Final    BLOOD BLOOD LEFT ARM Performed at [place] [address] [place], [place] [address]    Special Requests   Final    BOTTLES DRAWN AEROBIC AND ANAEROBIC Blood Culture adequate volume Performed at [place] [address] [place], [place] [address]    Culture   Final    NO GROWTH 4 DAYS Performed at [facility], [place] [address], [place], [place] [address]    Report Status PENDING  Incomplete  Blood culture (routine x 2)     Status: None   Collection Time: 01/24/21  3:22 PM   Specimen: BLOOD  Result Value Ref Range Status   Specimen Description   Final    BLOOD LEFT ARM Performed at [place] [address] [place], [place] [address]    Special Requests   Final    BOTTLES DRAWN AEROBIC AND ANAEROBIC Blood Culture adequate volume Performed at [place] [address], [place], [place] [address]    Culture   Final    NO GROWTH 5 DAYS Performed at [facility], [place] [address], [place], [place] [address]    Report Status 01/29/2021 FINAL  Final  Resp Panel by RT-PCR (Flu A&B, Covid) Nasopharyngeal Swab     Status: None   Collection Time: 01/24/21  5:27 PM   Specimen: Nasopharyngeal Swab; Nasopharyngeal(NP) swabs in  vial transport medium  Result Value Ref Range Status  SARS Coronavirus 2 by RT PCR NEGATIVE NEGATIVE Final    Comment: (NOTE) SARS-CoV-2 target nucleic acids are NOT DETECTED.  The SARS-CoV-2 RNA is generally detectable in upper respiratory specimens during the acute phase of infection. The lowest concentration of SARS-CoV-2 viral copies this assay can detect is 138 copies/mL. A negative result does not preclude SARS-Cov-2 infection and should not be used as the sole basis for treatment or other patient management decisions. A negative result may occur with  improper specimen collection/handling, submission of specimen other than nasopharyngeal swab, presence of viral mutation(s) within the areas targeted by this assay, and inadequate number of viral copies(<138 copies/mL). A negative result must be combined with clinical observations, patient history, and epidemiological information. The expected result is Negative.  Fact Sheet for Patients:  EntrepreneurPulse.com.au  Fact Sheet for Healthcare Providers:  IncredibleEmployment.be  This test is no t yet approved or cleared by the [place] FDA and  has been authorized for detection and/or diagnosis of SARS-CoV-2 by FDA under an Emergency Use Authorization (EUA). This EUA will remain  in effect (meaning this test can be used) for the duration of the COVID-19 declaration under Section 564(b)(1) of the Act, 21 U.S.C.section 360bbb-3(b)(1), unless the authorization is terminated  or revoked sooner.       Influenza A by PCR NEGATIVE NEGATIVE Final   Influenza B by PCR NEGATIVE NEGATIVE Final    Comment: (NOTE) The Xpert Xpress SARS-CoV-2/FLU/RSV plus assay is intended as an aid in the diagnosis of influenza from Nasopharyngeal swab specimens and should not be used as a sole basis for treatment. Nasal washings and aspirates are unacceptable for Xpert Xpress SARS-CoV-2/FLU/RSV testing.  Fact  Sheet for Patients: EntrepreneurPulse.com.au  Fact Sheet for Healthcare Providers: IncredibleEmployment.be  This test is not yet approved or cleared by the [place] FDA and has been authorized for detection and/or diagnosis of SARS-CoV-2 by FDA under an Emergency Use Authorization (EUA). This EUA will remain in effect (meaning this test can be used) for the duration of the COVID-19 declaration under Section 564(b)(1) of the Act, 21 U.S.C. section 360bbb-3(b)(1), unless the authorization is terminated or revoked.  Performed at [facility], [place] [address] [place], [place] [address]   Culture, Urine     Status: Abnormal   Collection Time: 01/24/21  5:35 PM   Specimen: Urine, Catheterized  Result Value Ref Range Status   Specimen Description   Final    URINE, CATHETERIZED Performed at [place] [address], [place], [place] [address]    Special Requests   Final    NONE Performed at [facility], [place] [address] [place], [place] [address]    Culture MULTIPLE SPECIES PRESENT, SUGGEST RECOLLECTION (A)  Final   Report Status 01/26/2021 FINAL  Final    RADIOLOGY STUDIES/RESULTS: No results found.   LOS: 5 days   Oren Binet, MD  [facility]    To contact the attending provider between 7A-7P or the covering provider during after hours 7P-7A, please log into the web site [email or web address] and access using universal [place] password for that web site. If you do not have the password, please call the hospital operator.  01/30/2021, 11:04 AM

## 2021-01-30 NOTE — Consult Note (Addendum)
ELECTROPHYSIOLOGY CONSULT NOTE    Patient ID: Michele Green MRN: [id number], DOB/AGE: 82-Jan-1942 82 y.o.  Admit date: 01/24/2021 Date of Consult: 01/30/2021  Primary Physician: [name], MD Primary Cardiologist: Sinclair Grooms, MD  Electrophysiologist: Dr. Curt Bears  Referring Provider: Dr. Oval Linsey  Patient Profile: Michele Green is a 80 y.o. female with a history of CAD s/p CABG, severe MR s/p repair, atrial fib s/p MAZE without LAA ligation with recurrent AF, persistent atypical atrial flutter, and adrenal insufficiency who is being seen today for the evaluation of atrial flutter with RVR at the request of Dr. Oval Linsey.  HPI:  Michele Green is a 80 y.o. female with complicated medical history as above.   Seen last by EP during admission 07/2020 in the setting of T7 compression fracture with elevated HRs and described as "not a candidate for rhythm control" with severe LAE, having previously failed amio, and failed MAZE. IV cardizem recommended for rate control. Has also been previously started that amiodarone could be used as rate control.  Additional history of her atrial arrhythmias includes -attempt at amiodarone and cardioversion 09/2018, recurrent fib/flutter, planned for rate control -discontinuation of diltiazem due to hypotension and LE edema in 07/2020 -started on digoxin during hospitalization 07/2020 -follow up appt 08/29/20 with [name] and had excellent rate control on digoxin 0.125 mg daily (changed to 5 days/week dosing) and metoprolol tartrate 100 mg BID -admitted 01/17/21 for shortness of breath and lightheadedness. Rapid rate in the 110s. Cardiology not consulted that admission, but telemetry noted as sinus tachycardia. Diltiazem and digoxin held initially, restarted on lower dose metoprolol and diltiazem, digoxin held. Hypotension felt to be adrenal insufficiency. She was discharged on 01/21/21 on diltiazem 180 mg daily and metoprolol tartrate 75 mg BID.  Of note, digoxin level on arrival was 2.3 2/26, but there are no comments regarding clinical symptoms of toxicity.  -ECG from 01/16/21 read as sinus with second degree AV block, type II. PR is prolonged prior to dropped beat and restarts with shorter PR, and appears consistent with second degree AV block, type I (Wenkebach) -ECG from 01/17/21 read as atrial fibrillation, but there are clear P waves. Again consistent with Wenkebach. -ECG from 01/24/21 is atrial flutter at 146 bpm, 2:1 block  She presented back to [facility] 01/24/21 with dysuria and yellow drainage from extremities. Wound on left heal with chronic component and multiple skin tears noted.   Pertinent labs on admission include Cortisol 11.7, Lactic acid 2.1, UA hazy with trace leukocytes and rare bacteria, BCx NGTD, Cr 0.78, K 4.4, HS trop 25 without trend, BNP  478, COVID negative.   UCx with multiple species.   She has had issues with HRs 110-140s, and started on amiodarone gtt. With ECG 2/26 with ? NSR, was recommended for [facility] which occurred 3/10. Unfortunately, she had ERAF within 2-3 hours. Rates have varied between 110-130s in what appear to be two different atrial arrhythmias.   Pt is awake and alert on exam. She feels "so tired". She denies tachy-palpitations or SOB at rest. She is essentially bed bound even prior to admission given her back issues throughout the fall and her leg wounds.  (Pt note 01/26/21 with patient requiring max assist from recliner to bed with lift equipment. Extensive assistance of 2 for rolling and repositioning in bed.)  Past Medical History:  Diagnosis Date  . Arrhythmia   . Atypical atrial flutter ([place]) 08/14/2018   Post-operative  . Breast cancer of upper-outer quadrant  of left female breast ([place]) 02/02/2016  . CAD (coronary artery disease) 06/20/2018   LHC 7/19: pLAD 65/90, oD1 90, mLCx 85, OM2 50, oRCA 30, EF 50-55 >> s/p CABG in 9/19 (L-LAD)  . Colon polyp 02/2012  . Dupuytren contracture    bilateral hands   . Family history of breast cancer   . Hyperlipidemia   . Hypertension   . Mitral regurgitation 11/07/2013   Echo 7/19: Mild LVH, EF 60-65, no RWMA, mod ot severe MR, massive LAE, PASP 33 // TEE 7/19:  Mild conc LVH, EF 60-65, no RWMA, severe MR with mild post leaflet prolapse, massive // s/p MV repair 07/2018  . On continuous oral anticoagulation 08/22/2015   Started on Xarelto 08/12/2015   . Osteopenia   . Persistent atrial fibrillation ([place]) 08/22/2015   Started late August or early September 2016 // s/p Maze procedure 07/2018  . Personal history of radiation therapy   . Radiation Therapy 04/21/16-05/19/16   left breast 47.72 Gy, boosted to 10 Gy  . 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 S/P Maze operation for atrial fibrillation 08/10/2018   Complete bilateral atrial lesion set using bipolar radiofrequency and cryothermy ablation with clipping of LA appendage  . S/P MVR (mitral valve repair) 08/10/2018   Complex valvuloplasty including Gore-tex neochord placement x8, Plication of Lateral Commissure and 43m Sorin Memo 4D Ring Annuloplasty SN# [id number] . Ulcerative colitis ([place]   . Wears glasses      Surgical History:  Past Surgical History:  Procedure Laterality Date  . BIOPSY  07/14/2020   Procedure: BIOPSY;  Surgeon: KRonnette Juniper MD;  Location: [facility];  Service: Gastroenterology;;  . BREAST BIOPSY Left 01/28/2016  . BREAST LUMPECTOMY Left 02/23/2016  . BREAST LUMPECTOMY WITH RADIOACTIVE SEED LOCALIZATION Left 02/23/2016   Procedure: BREAST LUMPECTOMY WITH RADIOACTIVE SEED LOCALIZATION;  Surgeon: BExcell Seltzer MD;  [place]  Service: General;  Laterality: Left;  . CARDIAC CATHETERIZATION    . CARDIOVERSION N/A 10/02/2015   Procedure: CARDIOVERSION;  Surgeon: MJerline Pain MD;  [facility];  Service: Cardiovascular;  Laterality: N/A;  . CARDIOVERSION N/A 10/05/2018   Procedure: CARDIOVERSION;  Surgeon: RFay Records MD;  [facility];  Service: Cardiovascular;  Laterality: N/A;  . CARDIOVERSION N/A 11/05/2019   Procedure: CARDIOVERSION;  Surgeon: NDorothy Spark MD;  [place]  Service: Cardiovascular;  Laterality: N/A;  . CLIPPING OF ATRIAL APPENDAGE  08/10/2018   Procedure: CLIPPING OF ATRIAL APPENDAGE;  Surgeon: ORexene Alberts MD;  [place]  Service: Open Heart Surgery;;  . COLONOSCOPY    . COLONOSCOPY WITH PROPOFOL N/A 07/14/2020   Procedure: COLONOSCOPY WITH PROPOFOL;  Surgeon: KRonnette Juniper MD;  [facility];  Service: Gastroenterology;  Laterality: N/A;  . CORONARY ARTERY BYPASS GRAFT N/A 08/10/2018   Procedure: CORONARY ARTERY BYPASS GRAFTING (CABG) x 1, LIMA-LAD,  USING LEFT INTERNAL MAMMARY ARTERY. HARVESTED RIGHT GREATER SAPHENOUS VEIN ENDOSCOPICALLY;  Surgeon: ORexene Alberts MD;  [place]  Service: Open Heart Surgery;  Laterality: N/A;  . CYSTOSCOPY W/ RETROGRADES Bilateral 06/20/2019   Procedure: CYSTOSCOPY WITH RETROGRADE PYELOGRAM;  Surgeon: HArdis Hughs MD;  [facility]  Service: Urology;  Laterality: Bilateral;  . CYSTOSCOPY WITH HYDRODISTENSION AND BIOPSY N/A 06/20/2019   Procedure: CYSTOSCOPY BLADDER  BIOPSY WITH FULGERATION;  Surgeon: HArdis Hughs MD;  [facility]  Service: Urology;  Laterality: N/A;  .  DUPUYTREN CONTRACTURE RELEASE  2001   leftx2  . [place]   right  . DUPUYTREN CONTRACTURE RELEASE Right 05/02/2014   Procedure: EXCISION DUPUYTRENS RIGHT PALMAR/SMALL ;  Surgeon: Cammie Sickle, MD;  Location: [place];  Service: Orthopedics;  Laterality: Right;  . HEMOSTASIS CLIP PLACEMENT  07/14/2020   Procedure: HEMOSTASIS CLIP PLACEMENT;  Surgeon: Ronnette Juniper, MD;  [facility]: [facility];  Service: Gastroenterology;;  . IR VERTEBROPLASTY CERV/THOR BX INC UNI/BIL INC/INJECT/IMAGING  08/19/2020  . IR VERTEBROPLASTY CERV/THOR BX INC  UNI/BIL INC/INJECT/IMAGING  09/15/2020  . MAZE N/A 08/10/2018   Procedure: MAZE;  Surgeon: Rexene Alberts, MD;  Location: [place];  Service: Open Heart Surgery;  Laterality: N/A;  . MITRAL VALVE REPAIR N/A 08/10/2018   Procedure: MITRAL VALVE REPAIR (MVR);  Surgeon: Rexene Alberts, MD;  Location: [place];  Service: Open Heart Surgery;  Laterality: N/A;  glutaraldehyde  . POLYPECTOMY  07/14/2020   Procedure: POLYPECTOMY;  Surgeon: Ronnette Juniper, MD;  [facility];  Service: Gastroenterology;;  . RIGHT/LEFT HEART CATH AND CORONARY ANGIOGRAPHY N/A 06/20/2018   Procedure: RIGHT/LEFT HEART CATH AND CORONARY ANGIOGRAPHY;  Surgeon: [name], MD;  Location: [place] CV LAB;  Service: Cardiovascular;  Laterality: N/A;  . TEE WITHOUT CARDIOVERSION N/A 06/20/2018   Procedure: TRANSESOPHAGEAL ECHOCARDIOGRAM (TEE);  Surgeon: Pixie Casino, MD;  [facility];  Service: Cardiovascular;  Laterality: N/A;  . TEE WITHOUT CARDIOVERSION N/A 08/10/2018   Procedure: TRANSESOPHAGEAL ECHOCARDIOGRAM (TEE);  Surgeon: Rexene Alberts, MD;  Location: [place];  Service: Open Heart Surgery;  Laterality: N/A;  . TONSILLECTOMY  age 68     Medications Prior to Admission  Medication Sig Dispense Refill Last Dose  . acetaminophen (TYLENOL) 500 MG tablet Take 500-1,000 mg by mouth every 6 (six) hours as needed for moderate pain or headache.    Past Week at Unknown time  . allopurinol (ZYLOPRIM) 100 MG tablet Take 200 mg by mouth every evening.    01/23/2021 at Unknown time  . atorvastatin (LIPITOR) 20 MG tablet TAKE 1 TABLET BY MOUTH EVERY DAY (Patient taking differently: Take 20 mg by mouth daily.) 90 tablet 3 01/23/2021 at Unknown time  . diltiazem (CARDIZEM CD) 180 MG 24 hr capsule Take 180 mg by mouth daily.   01/24/2021 at Unknown time  . hydrOXYzine (ATARAX/VISTARIL) 25 MG tablet Take 25 mg by mouth in the morning and at bedtime.   01/24/2021 at Unknown time  . mesalamine (APRISO) 0.375 g 24 hr capsule Take  0.375 g by mouth daily.   01/24/2021 at Unknown time  . metoprolol tartrate (LOPRESSOR) 50 MG tablet Take 75 mg by mouth 2 (two) times daily.   01/24/2021 at 0800  . Multiple Vitamin (MULTIVITAMIN WITH MINERALS) TABS tablet Take 1 tablet by mouth daily.   01/24/2021 at Unknown time  . oxyCODONE (OXY IR/ROXICODONE) 5 MG immediate release tablet Take 1 tablet (5 mg total) by mouth every 6 (six) hours as needed for severe pain. 10 tablet 0 01/23/2021 at Unknown time  . predniSONE (DELTASONE) 10 MG tablet Take 2 tablets daily for 7 days, then 1 tablet daily for 7 days, then half tablet daily for 7 days, then stop. 26 tablet 0 01/24/2021 at Unknown time  . tamoxifen (NOLVADEX) 20 MG tablet Take 20 mg by mouth daily.   01/24/2021 at Unknown time  . XARELTO 15 MG TABS tablet TAKE 1 TABLET BY MOUTH  DAILY WITH SUPPER (Patient  taking differently: Take 15 mg by mouth at bedtime.) 90 tablet 0 01/23/2021 at 2000  . alendronate (FOSAMAX) 70 MG tablet Take 70 mg by mouth once a week.   unknown  . bisacodyl (DULCOLAX) 10 MG suppository Place 1 suppository (10 mg total) rectally daily as needed for moderate constipation. 12 suppository 0 unknown  . thiamine 100 MG tablet Take 1 tablet (100 mg total) by mouth daily. 30 tablet 0     Inpatient Medications:  . allopurinol  200 mg Oral QPM  . atorvastatin  20 mg Oral Daily  . Chlorhexidine Gluconate Cloth  6 each Topical Daily  . diltiazem  120 mg Oral Daily  . feeding supplement  1 Container Oral BID BM  . furosemide  40 mg Intravenous BID  . hydrocortisone sod succinate (SOLU-CORTEF) inj  25 mg Intravenous Q8H  . hydrOXYzine  25 mg Oral BID  . mouth rinse  15 mL Mouth Rinse BID  . mesalamine  250 mg Oral BID  . multivitamin with minerals  1 tablet Oral Daily  . potassium chloride  40 mEq Oral Once  . Rivaroxaban  15 mg Oral QPC supper  . saccharomyces boulardii  250 mg Oral BID  . sodium chloride flush  10-40 mL Intracatheter Q12H  . tamoxifen  20 mg Oral Daily  .  thiamine  100 mg Oral Daily    Allergies:  Allergies  Allergen Reactions  . Shellfish Allergy Itching  . Amoxicillin-Pot Clavulanate Diarrhea    Social History   Socioeconomic History  . Marital status: Widowed    Spouse name: Not on file  . Number of children: 1  . Years of education: Not on file  . Highest education level: Not on file  Occupational History  . Not on file  Tobacco Use  . Smoking status: Former Smoker    Packs/day: 1.00    Years: 20.00    Pack years: 20.00    Types: Cigarettes    Quit date: 11/22/1992    Years since quitting: 28.2  . Smokeless tobacco: Never Used  Vaping Use  . Vaping Use: Never used  Substance and Sexual Activity  . Alcohol use: Yes    Alcohol/week: 3.0 standard drinks    Types: 3 Standard drinks or equivalent per week    Comment: social  . Drug use: No  . Sexual activity: Not Currently    Partners: Male    Birth control/protection: Post-menopausal  Other Topics Concern  . Not on file  Social History Narrative  . Not on file   Social Determinants of Health   Financial Resource Strain: Not on file  Food Insecurity: Not on file  Transportation Needs: Not on file  Physical Activity: Not on file  Stress: Not on file  Social Connections: Not on file  Intimate Partner Violence: Not on file     Family History  Problem Relation Age of Onset  . Cirrhosis Mother   . Breast cancer Mother 52  . Heart failure Father   . Heart attack Father   . Breast cancer Sister        early 19's  . Kidney Stones Sister        loss of kidney due to stones  . Osteoporosis Neg Hx      Review of Systems: All other systems reviewed and are otherwise negative except as noted above.  Physical Exam: Vitals:   01/29/21 1820 01/29/21 1958 01/30/21 0504 01/30/21 0600  BP:  111/67 123/89 130/88  Pulse:  82 (!) 115 (!) 118  Resp:  16 18 19   Temp:  97.8 F (36.6 C) (!) 97.3 F (36.3 C)   TempSrc:   Oral   SpO2: 98% 99% 100% 100%  Weight:       Height:        GEN- The patient is chronically ill appearing, alert and oriented x 3 today.   HEENT: normocephalic, atraumatic; sclera clear, conjunctiva pink; hearing intact; oropharynx clear; neck supple Lungs- Normal work of breathing.  No wheezes, rales, rhonchi Heart- Irregular rhythm and slightly tachy rate.  GI- soft, non-tender, slightly distended. Extremities- Multiple wounds in varied stages of healing and multiple bandages including bilateral heal pads. She has 3+ soft dependent edema bilaterally with muscle wasting. UEs with significant bruising and multiple skin tears.  Skin- Many areas of bruising. UEs and LEs with thin, friable tissue. Multiple skin tears.  Psych- flat but appropriate affect.  Neuro- Sensation intact. Global weakness in setting of severe deconditioning.   Labs:   Lab Results  Component Value Date   WBC 8.6 01/30/2021   HGB 10.1 (L) 01/30/2021   HCT 31.4 (L) 01/30/2021   MCV 93.2 01/30/2021   PLT 111 (L) 01/30/2021    Recent Labs  Lab 01/30/21 0312  NA 141  K 2.7*  CL 99  CO2 35*  BUN 9  CREATININE 0.60  CALCIUM 8.0*  PROT 4.8*  BILITOT 0.5  ALKPHOS 53  ALT 57*  AST 43*  GLUCOSE 119*      Radiology/Studies: DG Elbow 2 Views Right  Result Date: 01/24/2021 CLINICAL DATA:  Provided history of: Pain on the olcer Patient reports pain about the ulnar aspect of right elbow for 1 week. EXAM: RIGHT ELBOW - 2 VIEW COMPARISON:  None. FINDINGS: Frontal and lateral views. Normal alignment and joint spaces. No fracture, erosion, or bone destruction. There is a moderate elbow joint effusion. Soft tissue edema is most prominent about the posterior and ulnar aspect of the elbow. IMPRESSION: Moderate elbow joint effusion, nonspecific. Generalized soft tissue edema most prominent posterior medially. Electronically Signed   By: Keith Rake M.D.   On: 01/24/2021 17:26   CT Angio Chest PE W and/or Wo Contrast  Result Date: 01/16/2021 CLINICAL DATA:   Shortness of breath EXAM: CT ANGIOGRAPHY CHEST WITH CONTRAST TECHNIQUE: Multidetector CT imaging of the chest was performed using the standard protocol during bolus administration of intravenous contrast. Multiplanar CT image reconstructions and MIPs were obtained to evaluate the vascular anatomy. CONTRAST:  167m OMNIPAQUE IOHEXOL 350 MG/ML SOLN COMPARISON:  None. FINDINGS: Cardiovascular: There is a optimal opacification of the pulmonary arteries. There is no central,segmental, or subsegmental filling defects within the pulmonary arteries. There is moderate cardiomegaly present. No pericardial effusion or thickening. No evidence right heart strain. There is normal three-vessel brachiocephalic anatomy without proximal stenosis. Scattered aortic atherosclerosis is seen. Mediastinum/Nodes: No hilar, mediastinal, or axillary adenopathy. Thyroid gland, trachea, and esophagus demonstrate no significant findings. Lungs/Pleura: Small bilateral pleural effusions are present, right greater than left. Interlobular septal thickening is noted. There is ground-glass streaky airspace opacity seen predominantly at both lung bases. No pneumothorax is noted. Upper Abdomen: No acute abnormalities present in the visualized portions of the upper abdomen. Contrast reflux seen within the IVC. Musculoskeletal: No chest wall abnormality. No acute or significant osseous findings. Overlying median sternotomy wires are present. Review of the MIP images confirms the above findings. IMPRESSION: 1. No central, segmental, or subsegmental pulmonary embolism 2. Small bilateral pleural  effusions, right greater than left 3. Findings suggestive of interstitial edema 4. Ground-glass patchy airspace opacities at both lung bases which could be due to inflammatory etiology or atelectasis. 5.  Aortic Atherosclerosis (ICD10-I70.0). Electronically Signed   By: Prudencio Pair M.D.   On: 01/16/2021 22:13   DG Chest Port 1 View  Result Date:  01/24/2021 CLINICAL DATA:  Looking for infectious source. Recent UTI last week. Continued dysuria. Swelling and weeping of right upper arm. EXAM: PORTABLE CHEST 1 VIEW COMPARISON:  Chest x-ray 01/16/2021, CT chest 01/16/2021 FINDINGS: Left appendage clip, mitral annular valve replacement, as well as sternotomy wires are again noted. The heart size and mediastinal contours are within normal limits. Aortic arch calcifications. Biapical pleural/pulmonary scarring. No focal consolidation. Similar appearing coarsened interstitial markings. Persistent bilateral trace to small volume pleural effusions with streaky and patchy airspace opacities. No pneumothorax. No acute osseous abnormality. Mid to lower thoracic body kyphoplasty again noted. IMPRESSION: Persistent bilateral trace to small volume pleural effusions with underlying infection/inflammation not excluded. Electronically Signed   By: Iven Finn M.D.   On: 01/24/2021 15:55   DG Chest Port 1 View  Result Date: 01/16/2021 CLINICAL DATA:  Shortness of breath. EXAM: PORTABLE CHEST 1 VIEW COMPARISON:  December 01, 2020. FINDINGS: Stable cardiomediastinal silhouette. No pneumothorax is noted. Status post cardiac valve repair. Mild left basilar atelectasis, infiltrate or scarring is noted with probable small loculated left pleural effusion. Minimal right basilar subsegmental atelectasis is noted with small pleural effusion. Bony thorax is unremarkable. IMPRESSION: Mild left basilar atelectasis, infiltrate or scarring is noted with probable small loculated left pleural effusion. Minimal right basilar subsegmental atelectasis is noted with small pleural effusion. Aortic Atherosclerosis (ICD10-I70.0). Electronically Signed   By: Marijo Conception M.D.   On: 01/16/2021 18:25   ECHOCARDIOGRAM COMPLETE  Result Date: 01/17/2021    ECHOCARDIOGRAM REPORT   Patient Name:   Michele Green Date of Exam: 01/17/2021 Medical Rec #:  [id number]        Height:       64.0 in  Accession #:    [id number]       Weight:       126.5 lb Date of Birth:  [birth year]        BSA:          1.611 m Patient Age:    60 years         BP:           95/59 mmHg Patient Gender: F                HR:           80 bpm. Exam Location:  Inpatient Procedure: 2D Echo Indications:    Heart Block, 2nd degree 144.1; Hypotension  History:        Patient has prior history of Echocardiogram examinations, most                 recent 09/05/2019. Prior CABG, Arrythmias:Atrial Flutter; Risk                 Factors:Hypertension and Dyslipidemia.                  Mitral Valve: Mitral Valve Repair. Procedure Date: 08/10/2018.  Sonographer:    Mikki Santee RDCS (AE) Referring Phys: [id number] [place]  1. s/p Mitral Valve Repair. Procedure Date: 08/10/2018.  2. Right ventricular systolic function is normal. The right ventricular size is normal. There is  moderately elevated pulmonary artery systolic pressure. The estimated right ventricular systolic pressure is 96.0 mmHg.  3. Left atrial size was severely dilated.  4. The aortic valve is tricuspid. Aortic valve regurgitation is not visualized. No aortic stenosis is present.  5. Tricuspid valve regurgitation is moderate.  6. Left ventricular ejection fraction, by estimation, is 60 to 65%. The left ventricle has normal function. The left ventricle has no regional wall motion abnormalities. Left ventricular diastolic parameters are indeterminate.  7. The inferior vena cava is normal in size with <50% respiratory variability, suggesting right atrial pressure of 8 mmHg. Comparison(s): A prior study was performed on 09/05/2019. Prior images reviewed side by side. Compared to prior increase in left atrial size, mitral regurgitation, pulmonic regurgitation, and tricuspid regurgitation. FINDINGS  Left Ventricle: Left ventricular ejection fraction, by estimation, is 60 to 65%. The left ventricle has normal function. The left ventricle has no regional wall motion  abnormalities. The left ventricular internal cavity size was normal in size. There is  no left ventricular hypertrophy. Left ventricular diastolic parameters are indeterminate. Right Ventricle: The right ventricular size is normal. No increase in right ventricular wall thickness. Right ventricular systolic function is normal. There is moderately elevated pulmonary artery systolic pressure. The tricuspid regurgitant velocity is 3.35 m/s, and with an assumed right atrial pressure of 8 mmHg, the estimated right ventricular systolic pressure is 45.4 mmHg. Left Atrium: Left atrial size was severely dilated. Right Atrium: Right atrial size was normal in size. Pericardium: There is no evidence of pericardial effusion. Mitral Valve: The mitral valve has been repaired/replaced. Moderate mitral valve regurgitation. There is a present in the mitral position. Procedure Date: 08/10/2018. 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 MV peak gradient, 19.4 mmHg. The mean mitral valve gradient is 5.5 mmHg with average heart rate of 72 bpm. Tricuspid Valve: The tricuspid valve is normal in structure. Tricuspid valve regurgitation is moderate. Aortic Valve: The aortic valve is tricuspid. There is moderate aortic valve annular calcification. Aortic valve regurgitation is not visualized. No aortic stenosis is present. Pulmonic Valve: The pulmonic valve was grossly normal. Pulmonic valve regurgitation is mild to moderate. No evidence of pulmonic stenosis. Aorta: The aortic root is normal in size and structure and the ascending aorta was not well visualized. Venous: The inferior vena cava is normal in size with less than 50% respiratory variability, suggesting right atrial pressure of 8 mmHg. IAS/Shunts: The atrial septum is grossly normal.  LEFT VENTRICLE PLAX 2D LVIDd:         4.20 cm LVIDs:         2.60 cm LV PW:         0.90 cm LV IVS:        1.10 cm LVOT diam:     2.00 cm LV SV:         57 LV SV Index:   36 LVOT Area:     3.14 cm  RIGHT  VENTRICLE TAPSE (M-mode): 0.9 cm LEFT ATRIUM              Index       RIGHT ATRIUM           Index LA diam:        4.80 cm  2.98 cm/m  RA Area:     19.70 cm LA Vol (A2C):   134.0 ml 83.20 ml/m RA Volume:   48.70 ml  30.24 ml/m LA Vol (A4C):   136.0 ml 84.44 ml/m LA Biplane Vol: 140.0 ml  86.93 ml/m  AORTIC VALVE LVOT Vmax:   112.00 cm/s LVOT Vmean:  71.000 cm/s LVOT VTI:    0.183 m  AORTA Ao Root diam: 3.00 cm MITRAL VALVE             TRICUSPID VALVE MV Area (PHT): 3.01 cm  TR Peak grad:   44.9 mmHg MV Area VTI:   0.83 cm  TR Vmax:        335.00 cm/s MV Peak grad:  19.4 mmHg MV Mean grad:  5.5 mmHg  SHUNTS MV Vmax:       2.20 m/s  Systemic VTI:  0.18 m MV Vmean:      97.6 cm/s Systemic Diam: 2.00 cm Rudean Haskell MD Electronically signed by Rudean Haskell MD Signature Date/Time: 01/17/2021/1:15:38 PM    Final     EKG: yesterday afternoon post [facility] shows NSR in 70s, unfortunately only lasted for ~ 2 hrs. (personally reviewed) EKG 3/9 "pre-cardioversion) shows SVT vs atypical atrial flutter 140s EKG 3/8 shows a second discrete rhythm, appears more like AT/SVT or a very atypical flutter. In 110-120s  TELEMETRY: AT/AFL 110-130s with multiple ohms signs (personally reviewed)  Assessment/Plan: 1.  Persistent atrial flutter and ? AT/SVT Pt with multiple atrial arryhtmias noted  s/p Maze 07/2018 and multiple cardioversion since.  Echo 01/17/21 with LVEF 60-65%, Severe LAE (chronically) Previously tolerated diltiazem and digoxin for rate control, but recently had elevated digoxin levels and was stopped. (Very elevated level on 3/8 was a peak level. She had been given dose of IV digoxin several hours earlier) Dig level was 2.3 on 0.0625 mg daily. Diltiazem has been on and off this admission with hypotension. I will attempt to resume low dose diltiazem for additional rate control.  Continue IV amiodarone for rate control for now. has been on and off with variable rate control.  Had initially  discussed the eventual potential for AV nodal ablation and pacing with Dr. Curt Bears. Her current medical issues with open wounds preclude her from this consideration.   2. Persistent atrial fibrillation Rhythms this admission do not appear to be fib. She is s/p maze in 07/2018.  3. CAD s/p CABG  No current s/s ischemia  4. UTI On ceftriaxone per primary team.   5. Adrenal insufficieny with hypotension Currently on stress dose steriods/IV hydrocortisone 25 mg every 8 hours Midodrine discontinued. Consider adding back as needed.  Rate control will likely be very difficult while receiving IV steroids. Her BP has improved and her rates remains poorly so; ? if transition to po steroids would be possible to potentially improve her rate control as well.    6. Acute on chronic diastolic CHF In setting of all of the above.  Remains on IV lasix  7. Hypokalemia K 3.1 -> 2.7 despite supp with on going IV diuresis Further complicating her tachyarryhtmias Aggressively supp as tolerated to keep K > 4.0 and Mg > 2.0  Would repeat BMET this afternoon and give additional supp prn.   8. Multiple LE wounds With deep tissue injury to bilateral heals.   9. Severe deconditioning She requires max assist for transfers and 2 person assist for rolling and repositioning in bed.  She has LE and UE muscle atrophy.   10. Goals of Care Her trajectory over the past 6 months has been exceedingly poor.  Overall she has a very poor prognosis and visiting goals of care would be prudent. She is already established as a DNR.   Incredibly complicated case. She has  multiple acute and chronic co-morbidities that are affecting her ability to maintain stable HRs including steroid use, UTI, chronic back and pain issues and deconditioning. BPs have been low requiring multiple adjustments to attempts at rate control.  Severe LAE and with co-morbidities the likelihood of achieving rhythm control is incredibly small. Will further  discuss rate control strategy with MD. She is not a candidate for EP procedures including arrhyhtmia ablation or AVJ ablation with pacing due to exceedingly high risk of infection.   For questions or updates, please contact [place] Please consult [email or web address] for contact info under Cardiology/STEMI.  Jacalyn Lefevre, PA-C  01/30/2021 8:15 AM

## 2021-01-30 NOTE — Progress Notes (Signed)
Physical Therapy Treatment Patient Details Name: Michele Green MRN: [id number] DOB: Nov 22, 1941 Today's Date: 01/30/2021    History of Present Illness 80 y.o. female presented to the emergency department at [facility] on 01/24/21  for evaluation of shortness of breath and oozing RT elbow with RUE pain. Pt found to be in A-Fib with RVR.  Recently hospitalized on 2/25- 01/21/21 for shortness of breath and lightheadeness, DC'd to [facility] ALF. Pt transferred to [facility] hospital on 3/10 and underwent cardioversion on same date. PMH significant for medical history significant for atrial fibrillation on Xarelto, history of breast cancer, CAD, history  mitral valve repair, chronic pain, ulcerative colitis, and COVID.    PT Comments    Pt limited by fatigue this session, refuses attempts at standing or out of bed mobility. Pt does demonstrate improvement in bed mobility technique with PT verbal and tactile cues. Pt also participates in LE exercise well with PT encouragement. PT provides HEP handout for patient to improve activity tolerance. PT continues to recommend SNF placement vs return to ALF as the patient was requiring significant assistance for mobility at ALF prior to this admission.   Follow Up Recommendations  SNF (or return to ALF, pt reports dependent mobility while at ALF prior to admission)     Equipment Recommendations  None recommended by PT    Recommendations for Other Services       Precautions / Restrictions Precautions Precautions: Fall Precaution Comments: monitor HR and SpO2, fragile skin, weeping Restrictions Weight Bearing Restrictions: No    Mobility  Bed Mobility Overal bed mobility: Needs Assistance Bed Mobility: Rolling;Sidelying to Sit;Sit to Supine Rolling: Mod assist Sidelying to sit: Mod assist   Sit to supine: Max assist        Transfers Overall transfer level:  (pt refuses to attempt transfer)                  Ambulation/Gait                  Stairs             Wheelchair Mobility    Modified Rankin (Stroke Patients Only)       Balance Overall balance assessment: Needs assistance Sitting-balance support: Single extremity supported;Bilateral upper extremity supported;Feet supported Sitting balance-Leahy Scale: Poor Sitting balance - Comments: minG-minA with UE support of bed Postural control: Posterior lean                                  Cognition Arousal/Alertness: Awake/alert Behavior During Therapy: Flat affect Overall Cognitive Status: No family/caregiver present to determine baseline cognitive functioning                                 General Comments: pt follows commands well, is self-limiting      Exercises General Exercises - Lower Extremity Ankle Circles/Pumps: AROM;Both;10 reps Quad Sets: AROM;Both;10 reps Gluteal Sets: AROM;Both;10 reps Long Arc Quad: AROM;Both;10 reps Heel Slides: AAROM;Both;5 reps Hip ABduction/ADduction: AROM;Both;10 reps;Seated    General Comments General comments (skin integrity, edema, etc.): pt tachycardic at rest in 130s, HR remains stable in 130s throughout session, pt on 2L [place]      Pertinent Vitals/Pain Pain Assessment: Faces Faces Pain Scale: Hurts little more Pain Location: generalized Pain Descriptors / Indicators: Grimacing Pain Intervention(s): Monitored during session    Home Living  Prior Function            PT Goals (current goals can now be found in the care plan section) Acute Rehab PT Goals Patient Stated Goal: to improve mobility and get well Progress towards PT goals: Progressing toward goals    Frequency    Min 2X/week      PT Plan Current plan remains appropriate;Frequency needs to be updated    Co-evaluation              AM-PAC PT "6 Clicks" Mobility   Outcome Measure  Help needed turning from your back to your side while in a flat bed without using  bedrails?: A Lot Help needed moving from lying on your back to sitting on the side of a flat bed without using bedrails?: A Lot Help needed moving to and from a bed to a chair (including a wheelchair)?: Total Help needed standing up from a chair using your arms (e.g., wheelchair or bedside chair)?: Total Help needed to walk in hospital room?: Total Help needed climbing 3-5 steps with a railing? : Total 6 Click Score: 8    End of Session Equipment Utilized During Treatment: Oxygen Activity Tolerance: Patient limited by fatigue Patient left: in bed;with call bell/phone within reach;with bed alarm set Nurse Communication: Mobility status PT Visit Diagnosis: Muscle weakness (generalized) (M62.81)     Time: 6435-3912 PT Time Calculation (min) (ACUTE ONLY): 16 min  Charges:  $Therapeutic Exercise: 8-22 mins                     Zenaida Niece, PT, DPT Acute Rehabilitation Pager: [phone]    Zenaida Niece 01/30/2021, 3:37 PM

## 2021-01-31 DIAGNOSIS — Z515 Encounter for palliative care: Secondary | ICD-10-CM

## 2021-01-31 DIAGNOSIS — I4891 Unspecified atrial fibrillation: Secondary | ICD-10-CM | POA: Diagnosis not present

## 2021-01-31 DIAGNOSIS — Z7189 Other specified counseling: Secondary | ICD-10-CM

## 2021-01-31 DIAGNOSIS — I959 Hypotension, unspecified: Secondary | ICD-10-CM | POA: Diagnosis not present

## 2021-01-31 DIAGNOSIS — C50412 Malignant neoplasm of upper-outer quadrant of left female breast: Secondary | ICD-10-CM | POA: Diagnosis not present

## 2021-01-31 DIAGNOSIS — E271 Primary adrenocortical insufficiency: Secondary | ICD-10-CM | POA: Diagnosis not present

## 2021-01-31 LAB — BASIC METABOLIC PANEL
Anion gap: 6 (ref 5–15)
BUN: 12 mg/dL (ref 8–23)
CO2: 35 mmol/L — ABNORMAL HIGH (ref 22–32)
Calcium: 8.7 mg/dL — ABNORMAL LOW (ref 8.9–10.3)
Chloride: 98 mmol/L (ref 98–111)
Creatinine, Ser: 0.71 mg/dL (ref 0.44–1.00)
GFR, Estimated: >60 mL/min (ref >60)
Glucose, Bld: 81 mg/dL (ref 70–99)
Potassium: 4.5 mmol/L (ref 3.5–5.1)
Sodium: 139 mmol/L (ref 135–145)

## 2021-01-31 NOTE — Consult Note (Signed)
Consultation Note Date: 01/31/2021   Patient Name: Michele Green  DOB: 05/31/1941  MRN: [id number]  Age / Sex: 80 y.o., female  PCP: [name], MD Referring Physician: Jonetta Osgood, MD  Reason for Consultation: Establishing goals of care and Hospice Evaluation  HPI/Patient Profile: 81 y.o. female  with past medical history of BRCA, mitral valve repair, ulcerative colitis, COVID in 11/2020, atrial fibrillation  who was admitted on 01/24/2021 with dysuria and leg edema.  She was having yellow drainage from her extremities.  She has suffered with adrenal insufficiency recently after being on steroids for COVID.  She was found to have atrial fibrillation with RVR (rates in the 140s) and was hypotensive.  She underwent cardioverson on 3/10 but is was not successful.  Patient's hgb dropped on 3/12 and she received a unit of blood.  Her albumin is 2.3.    Clinical Assessment and Goals of Care:  I have reviewed medical records including EPIC notes, labs and imaging, received report from the care team, examined the patient and met at bedside with her son Michele Green  to discuss diagnosis prognosis, [place], EOL wishes, disposition and options.  I introduced Palliative Medicine as specialized medical care for people living with serious illness. It focuses on providing relief from the symptoms and stress of a serious illness.   We discussed a brief life review of the patient. 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  She was married to a gentleman in the armed services and lived in [place] for several years.  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  Her primary care taker and Chauncey Reading is her son Michele Green.  Her  Husband died just a few short years ago.  I asked [name] how she is feeling.  She indicated that the unit of blood she received made her feel worse.  She seemed a bit down and I asked what  was on her mind.  She said that at times she feels like she's not going to get better and asked if I thought it was possible that she could die suddenly?  I asked her what her body was telling her.  She indicated that she felt she was nearing end of life.  We talked with Michele Green and [name] about the extra support services available thru Hospice.  They could have these at [facility].  Both [name] and Michele Green were quickly in agreement with Hospice services.  I believe they may be familiar with them from previous experience.  We discussed the aid, RN, SW, and chaplain team who could come several times a week to provide extra support.  I attempted to elicit values and goals of care important to the patient.  [name] does not wish to keep returning to the hospital.  She would rather have medical care come to [facility] for her if she needs it.  She and Michele Green are not interested in pursuing any invasive medical therapy.  They have decided against pace maker placement.  Hospice and Palliative Care services outpatient were explained and offered.  Questions and concerns were addressed.  The family was encouraged to call with questions or concerns.     Primary Decision Maker:  HCPOA son Michele Green    SUMMARY OF RECOMMENDATIONS     Return to [facility] when medically appropriate with Hospice services.  Patient wants minimally invasive medical treatment and does not wish to return to the hospital unless she can not be comfortable where she is.  Would continue most maintenance medications as patient has a prognosis of months  Code Status/Advance Care Planning:  DNR   Symptom Management:   Patient is comfortable.  Additional Recommendations (Limitations, Scope, Preferences):  No Artificial Feeding, No Blood Transfusions and No Lab Draws  Palliative Prophylaxis:   Frequent Pain Assessment  Psycho-social/Spiritual:   Desire for further Chaplaincy support: welcomed  Prognosis: less than 6 months -  rapid decline, now bedbound, Afib with RVR (rates currently in the 120-130) with hypotension, wounds, hypoalbuminemia with 3rd spacing.    Discharge Planning: Home with Hospice.  ALF [facility] with [facility]      Primary Diagnoses: Present on Admission: . Adrenal insufficiency (Addison's disease) ([place]) . Ulcerative colitis ([place]) . Persistent atrial fibrillation ([place]) . Hypotension . Elevated troponin . Chronic diastolic HF (heart failure) ([place]) . Breast cancer of upper-outer quadrant of left female breast ([place]) . Atrial fibrillation with RVR ([place]) . Anemia of chronic disease . Functional quadriplegia (H[facility])   I have reviewed the medical record, interviewed the patient and family, and examined the patient. The following aspects are pertinent.  Past Medical History:  Diagnosis Date  . Arrhythmia   . Atypical atrial flutter ([place]) 08/14/2018   Post-operative  . Breast cancer of upper-outer quadrant of left female breast ([place]) 02/02/2016  . CAD (coronary artery disease) 06/20/2018   LHC 7/19: pLAD 65/90, oD1 90, mLCx 85, OM2 50, oRCA 30, EF 50-55 >> s/p CABG in 9/19 (L-LAD)  . Colon polyp 02/2012  . Dupuytren contracture    bilateral hands  . Family history of breast cancer   . Hyperlipidemia   . Hypertension   . Mitral regurgitation 11/07/2013   Echo 7/19: Mild LVH, EF 60-65, no RWMA, mod ot severe MR, massive LAE, PASP 33 // TEE 7/19:  Mild conc LVH, EF 60-65, no RWMA, severe MR with mild post leaflet prolapse, massive // s/p MV repair 07/2018  . On continuous oral anticoagulation 08/22/2015   Started on Xarelto 08/12/2015   . Osteopenia   . Persistent atrial fibrillation ([place]) 08/22/2015   Started late August or early September 2016 // s/p Maze procedure 07/2018  . Personal history of radiation therapy   . Radiation Therapy 04/21/16-05/19/16   left breast 47.72 Gy, boosted to 10 Gy  . 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 S/P Maze operation for atrial fibrillation  08/10/2018   Complete bilateral atrial lesion set using bipolar radiofrequency and cryothermy ablation with clipping of LA appendage  . S/P MVR (mitral valve repair) 08/10/2018   Complex valvuloplasty including Gore-tex neochord placement x8, Plication of Lateral Commissure and 44m Sorin Memo 4D Ring Annuloplasty SN# [id number] . Ulcerative colitis ([place]   . Wears glasses    Social History   Socioeconomic History  . Marital status: Widowed    Spouse name: Not on file  . Number of children: 1  . Years of education: Not on file  . Highest education level: Not on file  Occupational History  . Not on file  Tobacco Use  . Smoking status: Former Smoker    Packs/day: 1.00    Years: 20.00    Pack years: 20.00    Types: Cigarettes    Quit date: 11/22/1992    Years since quitting: 28.2  . Smokeless tobacco: Never Used  Vaping Use  . Vaping Use: Never used  Substance and Sexual Activity  . Alcohol use: Yes    Alcohol/week: 3.0 standard drinks    Types: 3 Standard drinks or equivalent per week    Comment: social  . Drug use: No  . Sexual activity: Not Currently    Partners: Male    Birth control/protection: Post-menopausal  Other Topics Concern  . Not on file  Social History Narrative  . Not on file   Social Determinants of Health   Financial Resource Strain: Not on file  Food Insecurity: Not on file  Transportation Needs: Not on file  Physical Activity: Not on file  Stress: Not on file  Social Connections: Not on file   Family History  Problem Relation Age of Onset  . Cirrhosis Mother   . Breast cancer Mother 45  . Heart failure Father   . Heart attack Father   . Breast cancer Sister        early 3's  . Kidney Stones Sister        loss of kidney due to stones  . Osteoporosis Neg Hx     Allergies  Allergen Reactions  . Shellfish Allergy Itching  . Amoxicillin-Pot Clavulanate Diarrhea     Vital Signs: BP 106/62 (BP Location: Left Arm)   Pulse (!) 122   Temp 97.9  F (36.6 C) (Oral)   Resp 18   Ht 5' 4"  (1.626 m)   Wt 56.1 kg   SpO2 100%   BMI 21.23 kg/m  Pain Scale: 0-10   Pain Score: 6    SpO2: SpO2: 100 % O2 Device:SpO2: 100 % O2 Flow Rate: .O2 Flow Rate (L/min): 2 L/min    Palliative Assessment/Data: 30%     Time In: !:00 Time Out: 2:00 Time Total: 60 min. Visit consisted of counseling and education dealing with the complex and emotionally intense issues surrounding the need for palliative care and symptom management in the setting of serious and potentially life-threatening illness. Greater than 50%  of this time was spent counseling and coordinating care related to the above assessment and plan.  Signed by: Florentina Jenny, PA-C Palliative Medicine  Please contact Palliative Medicine Team phone at [phone] for questions and concerns.  For individual provider: See Shea Evans

## 2021-01-31 NOTE — Plan of Care (Signed)
  Problem: Clinical Measurements: Goal: Respiratory complications will improve Outcome: Progressing   Problem: Coping: Goal: Level of anxiety will decrease Outcome: Progressing   Problem: Safety: Goal: Ability to remain free from injury will improve Outcome: Progressing

## 2021-01-31 NOTE — Progress Notes (Signed)
PROGRESS NOTE        PATIENT DETAILS Name: Michele Green Age: 80 y.o. Sex: female Date of Birth: 1941-08-13 Admit Date: 01/24/2021 Admitting Physician Bonnielee Haff, MD ONG:EXBM, Nathen May, MD  Brief Narrative: Patient is a 80 y.o. female persistent A. Fib,?  Steroid-induced relative adrenal insufficiency,chronic diastolic heart failure, ulcerative colitis, mitral valve repair/maze procedure, history of breast cancer-just discharged from the hospital on 3/2 after being treated for A. fib/hypotension-brought to the ED on 3/5 with A. fib with RVR associated with hypotension.  She was transferred to [facility] underwent DC cardioversion on 3/10.  See below for further details  Significant events: 1/10-1/20>> hospitalized for Covid pneumonia-hypoxia-A. fib RVR 2/25-3/2>> hospitalized for A. fib RVR-hypotension-steroid induced adrenal insufficiency-d/c'd to SNF 3/5>> admit to [facility] for A. fib with RVR/hypotension 3/10>> transferred to [facility] for DC cardioversion 3/10>> cardioverted-sinus rhythm-but reverted to atrial flutter/atrial tachycardia  Significant studies: 2/26>> random a.m. cortisol 11.7-felt to be disproportionately low-patient hypotensive (see note 2/27) 2/26>> Echo: EF 60-65%, RVSP 52.9 3/5>> chest x-ray: No pneumonia 3/5>> x-ray right elbow: Moderate effusion  Antimicrobial therapy: Rocephin: 3/5>>3/10  Microbiology data: 3/5>> blood culture: No growth 3/5>> urine culture: Multiple species present  Procedures : 3/10>> DC cardioversion  Consults: Cardiology, EP, orthopedics  DVT Prophylaxis : Rivaroxaban (XARELTO) tablet 15 mg    Subjective: Lying comfortably in bed-heart rate in the 130s this morning-remains on amiodarone infusion.   Assessment/Plan: Persistent A. fib/a flutter/atrial tachycardia with RVR: Failed DC cardioversion on 3/10-currently on amiodarone infusion, oral Cardizem-remains on Xarelto.  Cardiology/EP  following-Per EP-and not a candidate for ablation-recommending rate control strategy.  Discussed with patient's son on 3/11-he also does not want to pursue procedures like ambulation/PPM-see goals of care discussion below.    Hypotension: Thought to be due to relative adrenal insufficiency-apparently patient has been on steroids for the past several weeks-BP stabilized-transition to oral prednisone with plans for slow taper.  Relative adrenal insufficiency: Due to significant steroid use over the past several weeks-recently admitted for COVID-19 pneumonia-apparently has been on steroids since then.  Random cortisol level on 3/26 while patient was hypotensive was felt to be disproportionately low-she has not been formally diagnosed with adrenal insufficiency-high suspicion that this is relative adrenal insufficiency due to steroid use-we will attempt a slow taper of steroids for the next several weeks.    Acute on chronic diastolic heart failure: Volume status is improved-but still has edema in lower extremities-continue IV Lasix-follow weights/electrolytes.   Complicated UTI: Cultures inconclusive-no further dysuria-given significant steroid use over the past several weeks-completed 5-day course of Rocephin.    Hypokalemia: Repleted  Hypomagnesemia: Repleted  HLD: Continue statin  Anxiety: Continue as needed Xanax.  History of CAD s/p CABG: No anginal symptoms  History of mitral valve repair-Maze procedure  History of breast cancer: On tamoxifen  History of ulcerative colitis: on mesalamine-apparently has had few loose stools intermittently.  Follow  Right elbow effusion: Pain/swelling has resolved-evaluated by orthopedics-recommended conservative management-no need for arthrocentesis per orthopedics.  Deconditioning/debility/functional quadriplegia: PT/OT following-SNF when more clinically stable.  Goals of care: DNR in place-discussed with patient's son over the phone on 3/11-he does  not desire heroic/aggressive measures-does not want to proceed with PPM placement/ablation etc.  Per EP-patient is not a candidate for ablation/pacing.  Per son-goals of care are for gentle medical treatment-and not to pursue procedures etc.  He is aware that if patient deteriorates-we need to involve hospice care.  We will go ahead and place a palliative care consult-for ongoing discussion-and for further delineation of goals of care.  Pressure Injury:    Pressure Injury 01/23/21 Heel Left Stage 1 -  Intact skin with non-blanchable redness of a localized area usually over a bony prominence. (Active)  01/23/21 2200  Location: Heel  Location Orientation: Left  Staging: Stage 1 -  Intact skin with non-blanchable redness of a localized area usually over a bony prominence.  Wound Description (Comments):   Present on Admission: Yes    Diet: Diet Order            Diet Heart Room service appropriate? Yes; Fluid consistency: Thin  Diet effective now                  Code Status: DNR  Family Communication: Spoke with Son-Nelson [phone] over phone on 3/11-palliative care will reach out to family later today.  Disposition Plan: Status is: Inpatient  Remains inpatient appropriate because:Inpatient level of care appropriate due to severity of illness   Dispo:  Patient From: [facility]  Planned Disposition: ALF  Medically stable for discharge: No     Barriers to Discharge: A. fib with RVR-on amiodarone infusion  Antimicrobial agents: Anti-infectives (From admission, onward)   Start     Dose/Rate Route Frequency Ordered Stop   01/25/21 1700  cefTRIAXone (ROCEPHIN) 1 g in sodium chloride 0.9 % 100 mL IVPB  Status:  Discontinued        1 g 200 mL/hr over 30 Minutes Intravenous Every 24 hours 01/24/21 2105 01/30/21 1134   01/24/21 1745  cefTRIAXone (ROCEPHIN) 1 g in sodium chloride 0.9 % 100 mL IVPB        1 g 200 mL/hr over 30 Minutes Intravenous  Once  01/24/21 1734 01/24/21 1808       Time spent: 35 minutes-Greater than 50% of this time was spent in counseling, explanation of diagnosis, planning of further management, and coordination of care.  MEDICATIONS: Scheduled Meds: . allopurinol  200 mg Oral QPM  . atorvastatin  20 mg Oral Daily  . Chlorhexidine Gluconate Cloth  6 each Topical Daily  . diltiazem  120 mg Oral Daily  . feeding supplement  1 Container Oral BID BM  . furosemide  40 mg Intravenous BID  . hydrOXYzine  25 mg Oral BID  . mouth rinse  15 mL Mouth Rinse BID  . mesalamine  250 mg Oral BID  . multivitamin with minerals  1 tablet Oral Daily  . predniSONE  20 mg Oral Q breakfast  . Rivaroxaban  15 mg Oral QPC supper  . saccharomyces boulardii  250 mg Oral BID  . sodium chloride flush  10-40 mL Intracatheter Q12H  . tamoxifen  20 mg Oral Daily  . thiamine  100 mg Oral Daily   Continuous Infusions: . amiodarone 30 mg/hr (01/31/21 0910)   PRN Meds:.acetaminophen **OR** acetaminophen, ALPRAZolam, HYDROcodone-acetaminophen, lip balm, loperamide, sodium chloride flush   PHYSICAL EXAM: Vital signs: Vitals:   01/30/21 2151 01/31/21 0013 01/31/21 0454 01/31/21 0818  BP: 100/71 111/85 113/74 103/77  Pulse: (!) 130 (!) 131 (!) 128 (!) 126  Resp: 20 16 17 18   Temp: 98 F (36.7 C) 97.8 F (36.6 C) 97.7 F (36.5 C) 98 F (36.7 C)  TempSrc: Oral Oral Oral Oral  SpO2: 100% 100% 100% 100%  Weight:   56.1 kg   Height:  Filed Weights   01/29/21 1129 01/29/21 1258 01/31/21 0454  Weight: 58.8 kg 58.4 kg 56.1 kg   Body mass index is 21.23 kg/m.   Gen Exam:Alert awake-not in any distress HEENT:atraumatic, normocephalic Chest: B/L clear to auscultation anteriorly CVS:S1S2 regular Abdomen:soft non tender, non distended Extremities:++ edema Neurology: Non focal Skin: no rash  I have personally reviewed following labs and imaging studies  LABORATORY DATA: CBC: Recent Labs  Lab 01/24/21 1610  01/25/21 0534 01/26/21 0749 01/27/21 0458 01/28/21 0610 01/29/21 0619 01/30/21 0312  WBC 13.8* 8.6 6.5 10.1 12.0* 9.0 8.6  NEUTROABS 13.0* 7.9*  --   --   --   --   --   HGB 8.0* 7.3* 7.6* 7.2* 9.9* 9.4* 10.1*  HCT 25.8* 24.4* 25.1* 23.1* 30.9* 30.1* 31.4*  MCV 99.2 100.0 99.6 98.3 94.8 95.6 93.2  PLT 123* 107* 120* 132* 121* 113* 111*    Basic Metabolic Panel: Recent Labs  Lab 01/24/21 1857 01/24/21 1934 01/25/21 0534 01/26/21 0749 01/27/21 0458 01/28/21 0610 01/29/21 0619 01/30/21 0312 01/30/21 1242 01/31/21 0509  NA  --   --  140 139 139 139 137 141 141 139  K  --   --  3.4* 3.4* 3.9 3.7 3.1* 2.7* 3.6 4.5  CL  --   --  108 107 110 109 102 99 101 98  CO2  --   --  26 25 25 25 27  35* 32 35*  GLUCOSE  --   --  118* 124* 131* 89 111* 119* 125* 81  BUN  --   --  12 12 12 11 15 9 10 12   CREATININE  --   --  0.56 0.50 0.56 0.52 0.73 0.60 0.65 0.71  CALCIUM  --   --  7.8* 7.7* 7.8* 7.8* 7.8* 8.0* 7.8* 8.7*  MG  --    < > 1.8 1.7 2.0  --  1.6* 2.3  --   --   PHOS 3.2  --  3.2  --   --   --   --   --   --   --    < > = values in this interval not displayed.    GFR: Estimated Creatinine Clearance: 46.8 mL/min (by C-G formula based on SCr of 0.71 mg/dL).  Liver Function Tests: Recent Labs  Lab 01/26/21 0749 01/27/21 0458 01/28/21 0610 01/29/21 0619 01/30/21 0312  AST 25 35 27 57* 43*  ALT 30 37 35 59* 57*  ALKPHOS 46 47 45 62 53  BILITOT 1.0 0.6 0.7 0.6 0.5  PROT 4.9* 4.8* 4.8* 4.6* 4.8*  ALBUMIN 2.2* 2.2* 2.3* 2.2* 2.2*   No results for input(s): LIPASE, AMYLASE in the last 168 hours. No results for input(s): AMMONIA in the last 168 hours.  Coagulation Profile: Recent Labs  Lab 01/24/21 1934 01/28/21 2000  INR 1.5* 1.5*    Cardiac Enzymes: Recent Labs  Lab 01/24/21 1934  CKTOTAL 10*    BNP (last 3 results) No results for input(s): PROBNP in the last 8760 hours.  Lipid Profile: No results for input(s): CHOL, HDL, LDLCALC, TRIG, CHOLHDL,  LDLDIRECT in the last 72 hours.  Thyroid Function Tests: Recent Labs    01/29/21 0619  FREET4 1.68*    Anemia Panel: No results for input(s): VITAMINB12, FOLATE, FERRITIN, TIBC, IRON, RETICCTPCT in the last 72 hours.  Urine analysis:    Component Value Date/Time   COLORURINE YELLOW 01/24/2021 1507   APPEARANCEUR HAZY (A) 01/24/2021 1507   LABSPEC 1.015  01/24/2021 1507   PHURINE 5.0 01/24/2021 1507   GLUCOSEU NEGATIVE 01/24/2021 1507   HGBUR NEGATIVE 01/24/2021 1507   BILIRUBINUR NEGATIVE 01/24/2021 1507   KETONESUR NEGATIVE 01/24/2021 1507   PROTEINUR NEGATIVE 01/24/2021 1507   NITRITE NEGATIVE 01/24/2021 1507   LEUKOCYTESUR TRACE (A) 01/24/2021 1507    Sepsis Labs: Lactic Acid, Venous    Component Value Date/Time   LATICACIDVEN 1.5 01/24/2021 1707    MICROBIOLOGY: Recent Results (from the past 240 hour(s))  Blood culture (routine x 2)     Status: None   Collection Time: 01/24/21  3:13 PM   Specimen: BLOOD  Result Value Ref Range Status   Specimen Description   Final    BLOOD BLOOD LEFT ARM Performed at [facility], [place] [address], [place], [place] [address]    Special Requests   Final    BOTTLES DRAWN AEROBIC AND ANAEROBIC Blood Culture adequate volume Performed at [facility] [address] [place], [place] [address]    Culture   Final    NO GROWTH 5 DAYS Performed at [facility], [place] [address], [place], [place] [address]    Report Status 01/30/2021 FINAL  Final  Blood culture (routine x 2)     Status: None   Collection Time: 01/24/21  3:22 PM   Specimen: BLOOD  Result Value Ref Range Status   Specimen Description   Final    BLOOD LEFT ARM Performed at [facility] [address], [place], [place] [address]    Special Requests   Final    BOTTLES DRAWN AEROBIC AND ANAEROBIC Blood Culture adequate volume Performed at [facility] [address], [place],  [place] [address]    Culture   Final    NO GROWTH 5 DAYS Performed at [facility], [place] [address] [place], [place] [address]    Report Status 01/29/2021 FINAL  Final  Resp Panel by RT-PCR (Flu A&B, Covid) Nasopharyngeal Swab     Status: None   Collection Time: 01/24/21  5:27 PM   Specimen: Nasopharyngeal Swab; Nasopharyngeal(NP) swabs in vial transport medium  Result Value Ref Range Status   SARS Coronavirus 2 by RT PCR NEGATIVE NEGATIVE Final    Comment: (NOTE) SARS-CoV-2 target nucleic acids are NOT DETECTED.  The SARS-CoV-2 RNA is generally detectable in upper respiratory specimens during the acute phase of infection. The lowest concentration of SARS-CoV-2 viral copies this assay can detect is 138 copies/mL. A negative result does not preclude SARS-Cov-2 infection and should not be used as the sole basis for treatment or other patient management decisions. A negative result may occur with  improper specimen collection/handling, submission of specimen other than nasopharyngeal swab, presence of viral mutation(s) within the areas targeted by this assay, and inadequate number of viral copies(<138 copies/mL). A negative result must be combined with clinical observations, patient history, and epidemiological information. The expected result is Negative.  Fact Sheet for Patients:  EntrepreneurPulse.com.au  Fact Sheet for Healthcare Providers:  IncredibleEmployment.be  This test is no t yet approved or cleared by the [place] FDA and  has been authorized for detection and/or diagnosis of SARS-CoV-2 by FDA under an Emergency Use Authorization (EUA). This EUA will remain  in effect (meaning this test can be used) for the duration of the COVID-19 declaration under Section 564(b)(1) of the Act, 21 U.S.C.section 360bbb-3(b)(1), unless the authorization is terminated  or revoked sooner.  Influenza A by PCR NEGATIVE NEGATIVE Final    Influenza B by PCR NEGATIVE NEGATIVE Final    Comment: (NOTE) The Xpert Xpress SARS-CoV-2/FLU/RSV plus assay is intended as an aid in the diagnosis of influenza from Nasopharyngeal swab specimens and should not be used as a sole basis for treatment. Nasal washings and aspirates are unacceptable for Xpert Xpress SARS-CoV-2/FLU/RSV testing.  Fact Sheet for Patients: EntrepreneurPulse.com.au  Fact Sheet for Healthcare Providers: IncredibleEmployment.be  This test is not yet approved or cleared by the [place] FDA and has been authorized for detection and/or diagnosis of SARS-CoV-2 by FDA under an Emergency Use Authorization (EUA). This EUA will remain in effect (meaning this test can be used) for the duration of the COVID-19 declaration under Section 564(b)(1) of the Act, 21 U.S.C. section 360bbb-3(b)(1), unless the authorization is terminated or revoked.  Performed at [facility], [place] [address], [place], [place] [address]   Culture, Urine     Status: Abnormal   Collection Time: 01/24/21  5:35 PM   Specimen: Urine, Catheterized  Result Value Ref Range Status   Specimen Description   Final    URINE, CATHETERIZED Performed at [facility] [address] [place], [place] [address]    Special Requests   Final    NONE Performed at [facility], [place] [address], [place], [place] [address]    Culture MULTIPLE SPECIES PRESENT, SUGGEST RECOLLECTION (A)  Final   Report Status 01/26/2021 FINAL  Final    RADIOLOGY STUDIES/RESULTS: No results found.   LOS: 6 days   Oren Binet, MD  [facility]    To contact the attending provider between 7A-7P or the covering provider during after hours 7P-7A, please log into the web site [email or web address] and access using universal [employer] password for that web site. If you do not have the password, please call the hospital  operator.  01/31/2021, 11:17 AM

## 2021-01-31 NOTE — Progress Notes (Addendum)
[facility])  Referral received for hospice services at [facility] once patient is discharged.  Spoke with son, answered questions and provided support.  DME discussed, will order hospital bed. [name] will have to go to her facility and remove her current bed to prepare for the hospital bed to be delivered. This likely will not happen until Sunday as he lives out of town.   Please arrange for any comfort medications that may be needed prior to hospice services starting to there is no lapse in her comfort.  Venia Carbon RN, BSN, [facility] Liaison

## 2021-02-01 ENCOUNTER — Encounter (HOSPITAL_COMMUNITY): Payer: Self-pay | Admitting: Cardiology

## 2021-02-01 DIAGNOSIS — I4819 Other persistent atrial fibrillation: Secondary | ICD-10-CM | POA: Diagnosis not present

## 2021-02-01 DIAGNOSIS — Z515 Encounter for palliative care: Secondary | ICD-10-CM | POA: Diagnosis not present

## 2021-02-01 DIAGNOSIS — R6510 Systemic inflammatory response syndrome (SIRS) of non-infectious origin without acute organ dysfunction: Secondary | ICD-10-CM | POA: Diagnosis not present

## 2021-02-01 DIAGNOSIS — E271 Primary adrenocortical insufficiency: Secondary | ICD-10-CM | POA: Diagnosis not present

## 2021-02-01 DIAGNOSIS — I4891 Unspecified atrial fibrillation: Secondary | ICD-10-CM | POA: Diagnosis not present

## 2021-02-01 LAB — BASIC METABOLIC PANEL
Anion gap: 6 (ref 5–15)
BUN: 16 mg/dL (ref 8–23)
CO2: 39 mmol/L — ABNORMAL HIGH (ref 22–32)
Calcium: 8.5 mg/dL — ABNORMAL LOW (ref 8.9–10.3)
Chloride: 92 mmol/L — ABNORMAL LOW (ref 98–111)
Creatinine, Ser: 0.73 mg/dL (ref 0.44–1.00)
GFR, Estimated: >60 mL/min (ref >60)
Glucose, Bld: 107 mg/dL — ABNORMAL HIGH (ref 70–99)
Potassium: 3.8 mmol/L (ref 3.5–5.1)
Sodium: 137 mmol/L (ref 135–145)

## 2021-02-01 LAB — MAGNESIUM: Magnesium: 1.7 mg/dL (ref 1.7–2.4)

## 2021-02-01 MED ORDER — DIGOXIN 125 MCG PO TABS
0.0625 mg | ORAL_TABLET | Freq: Every day | ORAL | Status: DC
Start: 2021-02-02 — End: 2021-02-03
  Administered 2021-02-02: 0.0625 mg via ORAL
  Filled 2021-02-01: qty 1

## 2021-02-01 MED ORDER — DIGOXIN 0.25 MG/ML IJ SOLN
0.1250 mg | Freq: Four times a day (QID) | INTRAMUSCULAR | Status: AC
Start: 2021-02-01 — End: 2021-02-01
  Administered 2021-02-01 (×2): 0.125 mg via INTRAVENOUS
  Filled 2021-02-01: qty 2

## 2021-02-01 MED ORDER — HYDROCODONE-ACETAMINOPHEN 5-325 MG PO TABS
1.0000 | ORAL_TABLET | ORAL | Status: DC | PRN
Start: 2021-02-01 — End: 2021-02-03

## 2021-02-01 MED ORDER — DIGOXIN 0.25 MG/ML IJ SOLN
0.1250 mg | Freq: Once | INTRAMUSCULAR | Status: DC
  Filled 2021-02-01: qty 2

## 2021-02-01 MED ORDER — AMIODARONE HCL 200 MG PO TABS
200.0000 mg | ORAL_TABLET | Freq: Every day | ORAL | Status: DC
  Administered 2021-02-01 – 2021-02-02 (×2): 200 mg via ORAL
  Filled 2021-02-01 (×2): qty 1

## 2021-02-01 NOTE — Progress Notes (Signed)
Daily Progress Note   Patient Name: Michele Green       Date: 02/01/2021 DOB: [birth year]  Age: 80 y.o. MRN#: [id number] Attending Physician: Thurnell Lose, MD Primary Care Physician: [name], MD Admit Date: 01/24/2021  Reason for Consultation/Follow-up:  To discuss complex medical decision making related to patient's goals of care  Subjective: Michele Green reports she is comfortable with no SOB or pain.  Tele indicates pulse rate is 120 - 130.  She would like to get out of bed to chair.  She is eating some. Plan is to go to [facility] tomorrow with Hospice.   Assessment: 80 yo female recent COVID.  Afib with RVR and hypotension.  Failed cardioversion.  Planning to go to ALF with Hospice.     Patient Profile/HPI:   80 y.o. female  with past medical history of BRCA, mitral valve repair, ulcerative colitis, COVID in 11/2020, atrial fibrillation  who was admitted on 01/24/2021 with dysuria and leg edema.  She was having yellow drainage from her extremities.  She has suffered with adrenal insufficiency recently after being on steroids for COVID.  She was found to have atrial fibrillation with RVR (rates in the 140s) and was hypotensive.  She underwent cardioverson on 3/10 but is was not successful.  Patient's hgb dropped on 3/12 and she received a unit of blood.  Her albumin is 2.3.  Length of Stay: 7   Vital Signs: BP 98/70 (BP Location: Right Arm)   Pulse (!) 125 Comment: Valerie, RN aware  Temp (!) 97.4 F (36.3 C) (Oral)   Resp 17   Ht 5' 4" (1.626 m)   Wt 55.5 kg   SpO2 100%   BMI 21.00 kg/m  SpO2: SpO2: 100 % O2 Device: O2 Device: Nasal Cannula O2 Flow Rate: O2 Flow Rate (L/min): 2 L/min       Palliative Assessment/Data: 30%     Palliative Care Plan     Recommendations/Plan:  DC tamoxifen on discharge.  Continue other maintenance medications as patient has a prognosis of months. (mesalamine, prednisone, xeralto, etc)  Please provide Rx for hydrocodone PRN pain, xanax prn anxiety - so there will be no lapse in comfort meds until Hospice can enroll her .  Code Status:  DNR  Prognosis:   < 6 months   Discharge  Planning:  to [facility] with Hospice.  Care plan was discussed with patient  Thank you for allowing the Palliative Medicine Team to assist in the care of this patient.  Total time spent:  25 min.     Greater than 50%  of this time was spent counseling and coordinating care related to the above assessment and plan.   , PA-C Palliative Medicine  Please contact Palliative MedicineTeam phone at [phone] for questions and concerns between 7 am - 7 pm.   Please see AMION for individual provider pager numbers.       

## 2021-02-01 NOTE — Progress Notes (Signed)
Progress Note  Patient Name: Michele Green Date of Encounter: 02/01/2021  [facility] Cardiologist: Michele Grooms, MD   Subjective   Feeling tired. Denies palpitations or shortness of breath.  Inpatient Medications    Scheduled Meds: . allopurinol  200 mg Oral QPM  . Chlorhexidine Gluconate Cloth  6 each Topical Daily  . digoxin  0.125 mg Intravenous Q6H  . diltiazem  120 mg Oral Daily  . feeding supplement  1 Container Oral BID BM  . furosemide  40 mg Intravenous BID  . hydrOXYzine  25 mg Oral BID  . mouth rinse  15 mL Mouth Rinse BID  . mesalamine  250 mg Oral BID  . predniSONE  20 mg Oral Q breakfast  . Rivaroxaban  15 mg Oral QPC supper  . saccharomyces boulardii  250 mg Oral BID  . tamoxifen  20 mg Oral Daily   Continuous Infusions: . amiodarone 30 mg/hr (02/01/21 0916)   PRN Meds: acetaminophen **OR** acetaminophen, ALPRAZolam, HYDROcodone-acetaminophen, lip balm, loperamide, sodium chloride flush   Vital Signs    Vitals:   02/01/21 0420 02/01/21 0848 02/01/21 0900 02/01/21 0915  BP: 113/80 103/70 98/70   Pulse: (!) 125   (!) 125  Resp: 14   17  Temp: 97.9 F (36.6 C)  (!) 97.5 F (36.4 C)   TempSrc: Oral  Axillary   SpO2: 100%   100%  Weight: 55.5 kg     Height:        Intake/Output Summary (Last 24 hours) at 02/01/2021 1130 Last data filed at 02/01/2021 1028 Gross per 24 hour  Intake 250 ml  Output 2775 ml  Net -2525 ml   Last 3 Weights 02/01/2021 01/31/2021 01/29/2021  Weight (lbs) 122 lb 5.7 oz 123 lb 10.9 oz 128 lb 12 oz  Weight (kg) 55.5 kg 56.1 kg 58.4 kg      Telemetry    Atrial tachycardia.  Rate 120 bpm.   Personally Reviewed  ECG    n/a - Personally Reviewed  Physical Exam   VS:  BP 98/70 (BP Location: Right Arm)   Pulse (!) 125 Comment: Valerie, RN aware  Temp (!) 97.5 F (36.4 C) (Axillary)   Resp 17   Ht 5' 4"  (1.626 m)   Wt 55.5 kg   SpO2 100%   BMI 21.00 kg/m  , BMI Body mass index is 21 kg/m. GENERAL:   Well appearing HEENT: Pupils equal round and reactive, fundi not visualized, oral mucosa unremarkable NECK:  No jugular venous distention, waveform within normal limits, carotid upstroke brisk and symmetric, no bruits LUNGS:  Clear to auscultation bilaterally HEART: Tachycardic.  Regular.  PMI not displaced or sustained,S1 and S2 within normal limits, no S3, no S4, no clicks, no rubs, no murmurs ABD:  Flat, positive bowel sounds normal in frequency in pitch, no bruits, no rebound, no guarding, no midline pulsatile mass, no hepatomegaly, no splenomegaly EXT:  2 plus pulses throughout, 2+ LE edema, no cyanosis no clubbing SKIN:  No rashes no nodules NEURO:  Cranial nerves II through XII grossly intact, motor grossly intact throughout PSYCH:  Cognitively intact, oriented to person place and time  Labs    High Sensitivity Troponin:   Recent Labs  Lab 01/16/21 1909 01/17/21 0524 01/24/21 1522 01/24/21 1707  TROPONINIHS 31* 30* 25* 24*      Chemistry Recent Labs  Lab 01/28/21 0610 01/29/21 0619 01/30/21 0312 01/30/21 1242 01/31/21 0509 02/01/21 0528  NA 139 137 141  141 139 137  K 3.7 3.1* 2.7* 3.6 4.5 3.8  CL 109 102 99 101 98 92*  CO2 25 27 35* 32 35* 39*  GLUCOSE 89 111* 119* 125* 81 107*  BUN 11 15 9 10 12 16   CREATININE 0.52 0.73 0.60 0.65 0.71 0.73  CALCIUM 7.8* 7.8* 8.0* 7.8* 8.7* 8.5*  PROT 4.8* 4.6* 4.8*  --   --   --   ALBUMIN 2.3* 2.2* 2.2*  --   --   --   AST 27 57* 43*  --   --   --   ALT 35 59* 57*  --   --   --   ALKPHOS 45 62 53  --   --   --   BILITOT 0.7 0.6 0.5  --   --   --   GFRNONAA >60 >60 >60 >60 >60 >60  ANIONGAP 5 8 7 8 6 6      Hematology Recent Labs  Lab 01/28/21 0610 01/29/21 0619 01/30/21 0312  WBC 12.0* 9.0 8.6  RBC 3.26* 3.15* 3.37*  HGB 9.9* 9.4* 10.1*  HCT 30.9* 30.1* 31.4*  MCV 94.8 95.6 93.2  MCH 30.4 29.8 30.0  MCHC 32.0 31.2 32.2  RDW 17.0* 16.5* 16.0*  PLT 121* 113* 111*    BNP No results for input(s): BNP, PROBNP in  the last 168 hours.   DDimer No results for input(s): DDIMER in the last 168 hours.   Radiology    No results found.  Cardiac Studies   Echo 01/17/21:  1. s/p Mitral Valve Repair. Procedure Date: 08/10/2018.  2. Right ventricular systolic function is normal. The right ventricular  size is normal. There is moderately elevated pulmonary artery systolic  pressure. The estimated right ventricular systolic pressure is 71.6 mmHg.  3. Left atrial size was severely dilated.  4. The aortic valve is tricuspid. Aortic valve regurgitation is not  visualized. No aortic stenosis is present.  5. Tricuspid valve regurgitation is moderate.  6. Left ventricular ejection fraction, by estimation, is 60 to 65%. The  left ventricle has normal function. The left ventricle has no regional  wall motion abnormalities. Left ventricular diastolic parameters are  indeterminate.  7. The inferior vena cava is normal in size with <50% respiratory  variability, suggesting right atrial pressure of 8 mmHg.   Patient Profile     80 y.o. female with CAD status post CABG, mitral vegetation status post repair, atrial fibrillation status post maze, recurrent atrial fibrillation, hypertension, hyperlipidemia, and chronic hypoxic respiratory failure on home O2 admitted with acute on chronic hypoxia and found to be in atrial fibrillation with RVR.  Assessment & Plan    # Persistent atrial fibrillation: # Atrial tachycardia: [name] has struggled to maintain sinus rhythm.  She has undergone a MAZE procedure and was back in atrial fibrillation this admission.  Digoxin levels were elevated so it has been discontinued.  Due to her overall debility she is not a candidate for ablation with pacemaker implantation. We will transition her back to oral amiodarone 245m bid x7 days then 2016mdaily.  She has not tolerated diltiazem due to hypotension.  Her heart rate is poorly controlled and she is still hypotensive.  We will  discontinue the diltiazem.  She was previously on digoxin 0.125 mg daily Monday through Friday with none on the weekends.  Her digoxin levels were elevated but she was asymptomatic.  Overall, it was much more effective at controlling her heart rate.  Given that  she is going home with palliative care and comfort measures, I am not very worried about her digoxin levels.  We will start 0.0625 milligrams daily tomorrow.  Given an additional dose of 0.136m IV today.  # Acute diastolic heart failure:  Transition to lasix 470mpo daily.    # CAD s/p CABG:  Not an active issue.  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   For questions or updates, please contact [facility] consult [email or web address] for contact info under        Signed, TiSkeet LatchMD  02/01/2021, 11:30 AM

## 2021-02-01 NOTE — Progress Notes (Signed)
PROGRESS NOTE        PATIENT DETAILS Name: Michele Green Age: 80 y.o. Sex: female Date of Birth: [birth year] Admit Date: 01/24/2021 Admitting Physician Bonnielee Haff, MD ZOX:WRUE, Nathen May, MD  Brief Narrative: Patient is a 80 y.o. female persistent A. Fib,?  Steroid-induced relative adrenal insufficiency,chronic diastolic heart failure, ulcerative colitis, mitral valve repair/maze procedure, history of breast cancer-just discharged from the hospital on 3/2 after being treated for A. fib/hypotension-brought to the ED on 3/5 with A. fib with RVR associated with hypotension.  She was transferred to [facility] underwent DC cardioversion on 3/10.  See below for further details  Significant events: 1/10-1/20>> hospitalized for Covid pneumonia-hypoxia-A. fib RVR 2/25-3/2>> hospitalized for A. fib RVR-hypotension-steroid induced adrenal insufficiency-d/c'd to SNF 3/5>> admit to [facility] for A. fib with RVR/hypotension 3/10>> transferred to [facility] for DC cardioversion 3/10>> cardioverted-sinus rhythm-but reverted to atrial flutter/atrial tachycardia  Significant studies: 2/26>> random a.m. cortisol 11.7-felt to be disproportionately low-patient hypotensive (see note 2/27) 2/26>> Echo: EF 60-65%, RVSP 52.9 3/5>> chest x-ray: No pneumonia 3/5>> x-ray right elbow: Moderate effusion  Antimicrobial therapy: Rocephin: 3/5>>3/10  Microbiology data: 3/5>> blood culture: No growth 3/5>> urine culture: Multiple species present  Procedures : 3/10>> DC cardioversion  Consults: Cardiology, EP, orthopedics  DVT Prophylaxis : Rivaroxaban (XARELTO) tablet 15 mg    Subjective:  Patient in bed, appears comfortable, denies any headache, no fever, no chest pain or pressure, no shortness of breath , no abdominal pain. No focal weakness.    Assessment/Plan:  Persistent A. fib/a flutter/atrial tachycardia with RVR: Failed DC cardioversion on 3/10-currently on  amiodarone infusion, oral Cardizem-remains on Xarelto.  Cardiology/EP following-Per EP-and not a candidate for ablation-recommending rate control strategy.  Have added 2 doses of IV digoxin on 02/01/2021 for better rate control, previous MD had discussed with patient's son on 3/11-he also does not want to pursue procedures like ambulation/PPM-see goals of care discussion below.    Hypotension: Thought to be due to relative adrenal insufficiency-apparently patient has been on steroids for the past several weeks-BP stabilized-transition to oral prednisone with plans for slow taper.  Relative adrenal insufficiency: Due to significant steroid use over the past several weeks-recently admitted for COVID-19 pneumonia-apparently has been on steroids since then.  Random cortisol level on 3/26 while patient was hypotensive was felt to be disproportionately low-she has not been formally diagnosed with adrenal insufficiency-high suspicion that this is relative adrenal insufficiency due to steroid use-we will attempt a slow taper of steroids for the next several weeks.    Acute on chronic diastolic heart failure: Volume status is improved-but still has edema in lower extremities-continue IV Lasix as tolerated by blood pressure-follow weights/electrolytes.   Complicated UTI: Cultures inconclusive-no further dysuria-given significant steroid use over the past several weeks-completed 5-day course of Rocephin.    Hypokalemia: Repleted  Hypomagnesemia: Repleted  HLD: Continue statin  Anxiety: Continue as needed Xanax.  History of CAD s/p CABG: No anginal symptoms  History of mitral valve repair-Maze procedure  History of breast cancer: On tamoxifen  History of ulcerative colitis: on mesalamine-apparently has had few loose stools intermittently.  Follow  Right elbow effusion: Pain/swelling has resolved-evaluated by orthopedics-recommended conservative management-no need for arthrocentesis per  orthopedics.  Deconditioning/debility/functional quadriplegia: PT/OT following-SNF when more clinically stable.  Goals of care: DNR in place-discussed with patient's son over the phone on 3/11-he  does not desire heroic/aggressive measures-does not want to proceed with PPM placement/ablation etc.  Per EP-patient is not a candidate for ablation/pacing.  Per son-goals of care are for gentle medical treatment-and not to pursue procedures etc.  He is aware that if patient deteriorates-we need to involve hospice care.  We will go ahead and place a palliative care consult-for ongoing discussion-and for further delineation of goals of care.  Pressure Injury:    Pressure Injury 01/23/21 Heel Left Stage 1 -  Intact skin with non-blanchable redness of a localized area usually over a bony prominence. (Active)  01/23/21 2200  Location: Heel  Location Orientation: Left  Staging: Stage 1 -  Intact skin with non-blanchable redness of a localized area usually over a bony prominence.  Wound Description (Comments):   Present on Admission: Yes    Diet: Diet Order            Diet Heart Room service appropriate? Yes; Fluid consistency: Thin  Diet effective now                  Code Status: DNR  Family Communication: Spoke with Son-Nelson Dessert-[phone] over phone on 3/11-palliative care will reach out to family later today.  Disposition Plan: Status is: Inpatient  Remains inpatient appropriate because:Inpatient level of care appropriate due to severity of illness   Dispo:  Patient From: [place]  Planned Disposition: ALF  Medically stable for discharge: No     Barriers to Discharge: A. fib with RVR-on amiodarone infusion  Antimicrobial agents: Anti-infectives (From admission, onward)   Start     Dose/Rate Route Frequency Ordered Stop   01/25/21 1700  cefTRIAXone (ROCEPHIN) 1 g in sodium chloride 0.9 % 100 mL IVPB  Status:  Discontinued        1 g 200 mL/hr over 30  Minutes Intravenous Every 24 hours 01/24/21 2105 01/30/21 1134   01/24/21 1745  cefTRIAXone (ROCEPHIN) 1 g in sodium chloride 0.9 % 100 mL IVPB        1 g 200 mL/hr over 30 Minutes Intravenous  Once 01/24/21 1734 01/24/21 1808       Time spent: 35 minutes-Greater than 50% of this time was spent in counseling, explanation of diagnosis, planning of further management, and coordination of care.  MEDICATIONS: Scheduled Meds: . allopurinol  200 mg Oral QPM  . amiodarone  200 mg Oral Daily  . Chlorhexidine Gluconate Cloth  6 each Topical Daily  . digoxin  0.125 mg Intravenous Q6H  . digoxin  0.125 mg Intravenous Once  . [START ON 02/02/2021] digoxin  0.0625 mg Oral Daily  . feeding supplement  1 Container Oral BID BM  . furosemide  40 mg Intravenous BID  . hydrOXYzine  25 mg Oral BID  . mouth rinse  15 mL Mouth Rinse BID  . mesalamine  250 mg Oral BID  . predniSONE  20 mg Oral Q breakfast  . Rivaroxaban  15 mg Oral QPC supper  . saccharomyces boulardii  250 mg Oral BID  . tamoxifen  20 mg Oral Daily   Continuous Infusions:  PRN Meds:.acetaminophen **OR** acetaminophen, ALPRAZolam, HYDROcodone-acetaminophen, lip balm, loperamide, sodium chloride flush   PHYSICAL EXAM: Vital signs: Vitals:   02/01/21 0420 02/01/21 0848 02/01/21 0900 02/01/21 0915  BP: 113/80 103/70 98/70   Pulse: (!) 125   (!) 125  Resp: 14   17  Temp: 97.9 F (36.6 C)  (!) 97.5 F (36.4 C)   TempSrc: Oral  Axillary  SpO2: 100%   100%  Weight: 55.5 kg     Height:       Filed Weights   01/29/21 1258 01/31/21 0454 02/01/21 0420  Weight: 58.4 kg 56.1 kg 55.5 kg   Body mass index is 21 kg/m.   Gen Exam:  Awake Alert, No new F.N deficits, Normal affect [place].AT,PERRAL Supple Neck,No JVD, No cervical lymphadenopathy appriciated.  Symmetrical Chest wall movement, Good air movement bilaterally, CTAB iRRR,No Gallops, Rubs or new Murmurs, No Parasternal Heave +ve B.Sounds, Abd Soft, No tenderness, No  organomegaly appriciated, No rebound - guarding or rigidity. No Cyanosis, + edema   I have personally reviewed following labs and imaging studies  LABORATORY DATA: CBC: Recent Labs  Lab 01/26/21 0749 01/27/21 0458 01/28/21 0610 01/29/21 0619 01/30/21 0312  WBC 6.5 10.1 12.0* 9.0 8.6  HGB 7.6* 7.2* 9.9* 9.4* 10.1*  HCT 25.1* 23.1* 30.9* 30.1* 31.4*  MCV 99.6 98.3 94.8 95.6 93.2  PLT 120* 132* 121* 113* 111*    Basic Metabolic Panel: Recent Labs  Lab 01/26/21 0749 01/27/21 0458 01/28/21 0610 01/29/21 0619 01/30/21 0312 01/30/21 1242 01/31/21 0509 02/01/21 0528  NA 139 139   < > 137 141 141 139 137  K 3.4* 3.9   < > 3.1* 2.7* 3.6 4.5 3.8  CL 107 110   < > 102 99 101 98 92*  CO2 25 25   < > 27 35* 32 35* 39*  GLUCOSE 124* 131*   < > 111* 119* 125* 81 107*  BUN 12 12   < > 15 9 10 12 16   CREATININE 0.50 0.56   < > 0.73 0.60 0.65 0.71 0.73  CALCIUM 7.7* 7.8*   < > 7.8* 8.0* 7.8* 8.7* 8.5*  MG 1.7 2.0  --  1.6* 2.3  --   --  1.7   < > = values in this interval not displayed.    GFR: Estimated Creatinine Clearance: 46.8 mL/min (by C-G formula based on SCr of 0.73 mg/dL).  Liver Function Tests: Recent Labs  Lab 01/26/21 0749 01/27/21 0458 01/28/21 0610 01/29/21 0619 01/30/21 0312  AST 25 35 27 57* 43*  ALT 30 37 35 59* 57*  ALKPHOS 46 47 45 62 53  BILITOT 1.0 0.6 0.7 0.6 0.5  PROT 4.9* 4.8* 4.8* 4.6* 4.8*  ALBUMIN 2.2* 2.2* 2.3* 2.2* 2.2*   No results for input(s): LIPASE, AMYLASE in the last 168 hours. No results for input(s): AMMONIA in the last 168 hours.  Coagulation Profile: Recent Labs  Lab 01/28/21 2000  INR 1.5*    Cardiac Enzymes: No results for input(s): CKTOTAL, CKMB, CKMBINDEX, TROPONINI in the last 168 hours.  BNP (last 3 results) No results for input(s): PROBNP in the last 8760 hours.  Lipid Profile: No results for input(s): CHOL, HDL, LDLCALC, TRIG, CHOLHDL, LDLDIRECT in the last 72 hours.  Thyroid Function Tests: No results for  input(s): TSH, T4TOTAL, FREET4, T3FREE, THYROIDAB in the last 72 hours.  Anemia Panel: No results for input(s): VITAMINB12, FOLATE, FERRITIN, TIBC, IRON, RETICCTPCT in the last 72 hours.  Urine analysis:    Component Value Date/Time   COLORURINE YELLOW 01/24/2021 1507   APPEARANCEUR HAZY (A) 01/24/2021 1507   LABSPEC 1.015 01/24/2021 1507   PHURINE 5.0 01/24/2021 1507   GLUCOSEU NEGATIVE 01/24/2021 1507   HGBUR NEGATIVE 01/24/2021 1507   BILIRUBINUR NEGATIVE 01/24/2021 1507   KETONESUR NEGATIVE 01/24/2021 1507   PROTEINUR NEGATIVE 01/24/2021 1507   NITRITE NEGATIVE 01/24/2021 1507  LEUKOCYTESUR TRACE (A) 01/24/2021 1507    Sepsis Labs: Lactic Acid, Venous    Component Value Date/Time   LATICACIDVEN 1.5 01/24/2021 1707    MICROBIOLOGY: Recent Results (from the past 240 hour(s))  Blood culture (routine x 2)     Status: None   Collection Time: 01/24/21  3:13 PM   Specimen: BLOOD  Result Value Ref Range Status   Specimen Description   Final    BLOOD BLOOD LEFT ARM Performed at [facility] [address] [place], [place] [address]    Special Requests   Final    BOTTLES DRAWN AEROBIC AND ANAEROBIC Blood Culture adequate volume Performed at [facility] [address] [place], [place] [address]    Culture   Final    NO GROWTH 5 DAYS Performed at [facility], [place] [address], [place], [place] [address]    Report Status 01/30/2021 FINAL  Final  Blood culture (routine x 2)     Status: None   Collection Time: 01/24/21  3:22 PM   Specimen: BLOOD  Result Value Ref Range Status   Specimen Description   Final    BLOOD LEFT ARM Performed at [facility] [address] [place] [address], [place], [place] [address]    Special Requests   Final    BOTTLES DRAWN AEROBIC AND ANAEROBIC Blood Culture adequate volume Performed at [facility] [address], [place], [place] [address]    Culture   Final    NO  GROWTH 5 DAYS Performed at [facility], [place] [address] [place],  [address]    Report Status 01/29/2021 FINAL  Final  Resp Panel by RT-PCR (Flu A&B, Covid) Nasopharyngeal Swab     Status: None   Collection Time: 01/24/21  5:27 PM   Specimen: Nasopharyngeal Swab; Nasopharyngeal(NP) swabs in vial transport medium  Result Value Ref Range Status   SARS Coronavirus 2 by RT PCR NEGATIVE NEGATIVE Final    Comment: (NOTE) SARS-CoV-2 target nucleic acids are NOT DETECTED.  The SARS-CoV-2 RNA is generally detectable in upper respiratory specimens during the acute phase of infection. The lowest concentration of SARS-CoV-2 viral copies this assay can detect is 138 copies/mL. A negative result does not preclude SARS-Cov-2 infection and should not be used as the sole basis for treatment or other patient management decisions. A negative result may occur with  improper specimen collection/handling, submission of specimen other than nasopharyngeal swab, presence of viral mutation(s) within the areas targeted by this assay, and inadequate number of viral copies(<138 copies/mL). A negative result must be combined with clinical observations, patient history, and epidemiological information. The expected result is Negative.  Fact Sheet for Patients:  EntrepreneurPulse.com.au  Fact Sheet for Healthcare Providers:  IncredibleEmployment.be  This test is no t yet approved or cleared by the [place] FDA and  has been authorized for detection and/or diagnosis of SARS-CoV-2 by FDA under an Emergency Use Authorization (EUA). This EUA will remain  in effect (meaning this test can be used) for the duration of the COVID-19 declaration under Section 564(b)(1) of the Act, 21 U.S.C.section 360bbb-3(b)(1), unless the authorization is terminated  or revoked sooner.       Influenza A by PCR NEGATIVE NEGATIVE Final   Influenza B by PCR NEGATIVE NEGATIVE Final     Comment: (NOTE) The Xpert Xpress SARS-CoV-2/FLU/RSV plus assay is intended as an aid in the diagnosis of influenza from Nasopharyngeal swab specimens and should not be  used as a sole basis for treatment. Nasal washings and aspirates are unacceptable for Xpert Xpress SARS-CoV-2/FLU/RSV testing.  Fact Sheet for Patients: EntrepreneurPulse.com.au  Fact Sheet for Healthcare Providers: IncredibleEmployment.be  This test is not yet approved or cleared by the [place] FDA and has been authorized for detection and/or diagnosis of SARS-CoV-2 by FDA under an Emergency Use Authorization (EUA). This EUA will remain in effect (meaning this test can be used) for the duration of the COVID-19 declaration under Section 564(b)(1) of the Act, 21 U.S.C. section 360bbb-3(b)(1), unless the authorization is terminated or revoked.  Performed at [facility], [place] [address] [place], [place] [address]   Culture, Urine     Status: Abnormal   Collection Time: 01/24/21  5:35 PM   Specimen: Urine, Catheterized  Result Value Ref Range Status   Specimen Description   Final    URINE, CATHETERIZED Performed at [facility] [address] [place], [place] [address]    Special Requests   Final    NONE Performed at [facility], [place] [address] [place], [place] [address]    Culture MULTIPLE SPECIES PRESENT, SUGGEST RECOLLECTION (A)  Final   Report Status 01/26/2021 FINAL  Final    RADIOLOGY STUDIES/RESULTS: No results found.   LOS: 7 days   Lala Lund, MD  [facility]  02/01/2021, 12:03 PM

## 2021-02-02 DIAGNOSIS — R6510 Systemic inflammatory response syndrome (SIRS) of non-infectious origin without acute organ dysfunction: Secondary | ICD-10-CM | POA: Diagnosis not present

## 2021-02-02 DIAGNOSIS — I951 Orthostatic hypotension: Secondary | ICD-10-CM

## 2021-02-02 LAB — BASIC METABOLIC PANEL
Anion gap: 7 (ref 5–15)
BUN: 24 mg/dL — ABNORMAL HIGH (ref 8–23)
CO2: 40 mmol/L — ABNORMAL HIGH (ref 22–32)
Calcium: 8.3 mg/dL — ABNORMAL LOW (ref 8.9–10.3)
Chloride: 90 mmol/L — ABNORMAL LOW (ref 98–111)
Creatinine, Ser: 0.86 mg/dL (ref 0.44–1.00)
GFR, Estimated: >60 mL/min (ref >60)
Glucose, Bld: 136 mg/dL — ABNORMAL HIGH (ref 70–99)
Potassium: 3.5 mmol/L (ref 3.5–5.1)
Sodium: 137 mmol/L (ref 135–145)

## 2021-02-02 LAB — SARS CORONAVIRUS 2 (TAT 6-24 HRS): SARS Coronavirus 2: NEGATIVE

## 2021-02-02 LAB — MAGNESIUM: Magnesium: 1.6 mg/dL — ABNORMAL LOW (ref 1.7–2.4)

## 2021-02-02 MED ORDER — OXYCODONE HCL 5 MG PO TABS: 5.0000 mg | ORAL_TABLET | Freq: Four times a day (QID) | ORAL | 0 refills | Status: DC | PRN

## 2021-02-02 MED ORDER — CLONAZEPAM 0.5 MG PO TBDP: 0.5000 mg | ORAL_TABLET | Freq: Two times a day (BID) | ORAL | 0 refills | Status: DC | PRN

## 2021-02-02 MED ORDER — DIGOXIN 62.5 MCG PO TABS: 0.0625 mg | ORAL_TABLET | Freq: Every day | ORAL | Status: DC

## 2021-02-02 MED ORDER — FUROSEMIDE 40 MG PO TABS: 40.0000 mg | ORAL_TABLET | Freq: Every day | ORAL | Status: DC

## 2021-02-02 MED ORDER — CLONAZEPAM 0.25 MG PO TBDP: 0.5000 mg | ORAL_TABLET | Freq: Two times a day (BID) | ORAL | Status: DC | PRN

## 2021-02-02 MED ORDER — DILTIAZEM HCL ER COATED BEADS 240 MG PO CP24: 240.0000 mg | ORAL_CAPSULE | Freq: Every day | ORAL | Status: DC

## 2021-02-02 MED ORDER — POTASSIUM CHLORIDE CRYS ER 20 MEQ PO TBCR
40.0000 meq | EXTENDED_RELEASE_TABLET | Freq: Once | ORAL | Status: AC
  Administered 2021-02-02: 40 meq via ORAL
  Filled 2021-02-02: qty 2

## 2021-02-02 MED ORDER — AMIODARONE HCL 200 MG PO TABS: 200.0000 mg | ORAL_TABLET | Freq: Every day | ORAL | Status: DC

## 2021-02-02 MED ORDER — PREDNISONE 5 MG PO TABS: ORAL_TABLET | ORAL | Status: DC

## 2021-02-02 MED ORDER — DILTIAZEM HCL ER COATED BEADS 240 MG PO CP24
240.0000 mg | ORAL_CAPSULE | Freq: Every day | ORAL | Status: DC
  Administered 2021-02-02: 240 mg via ORAL
  Filled 2021-02-02: qty 1

## 2021-02-02 MED ORDER — PREDNISONE 10 MG PO TABS
10.0000 mg | ORAL_TABLET | Freq: Every day | ORAL | Status: DC
  Filled 2021-02-02 (×2): qty 1

## 2021-02-02 MED ORDER — MAGNESIUM SULFATE 2 GM/50ML IV SOLN
2.0000 g | Freq: Once | INTRAVENOUS | Status: AC
  Administered 2021-02-02: 2 g via INTRAVENOUS
  Filled 2021-02-02: qty 50

## 2021-02-02 MED ORDER — FUROSEMIDE 40 MG PO TABS: 40.0000 mg | ORAL_TABLET | Freq: Once | ORAL | Status: DC

## 2021-02-02 MED ORDER — POTASSIUM CHLORIDE ER 20 MEQ PO TBCR: 20.0000 meq | EXTENDED_RELEASE_TABLET | Freq: Every day | ORAL | Status: DC

## 2021-02-02 NOTE — Progress Notes (Signed)
EPIC Template:  New Hospice at Home Referral Note  [facility])  Chart and pt information has been reviewed by [facility] physician.  Hospice eligibility confirmed.  Hospital liaison spoke today with pt's son, [name], to continue education related to hospice philosophy and services and to answer any questions at this time.  [name] confirmed that pt's own bed had been dismantled and hospital bed could be delivered. Per discussion the plan is to discharge home today by PTAR.    [place] send signed and completed DNR home with pt/family.  Please provide prescriptions at discharge as needed to ensure ongoing symptom management until pt can be admitted onto hospice services.    Delivery of hospital bed and supplemental oxygen have been confirmed  ACC information and contact numbers given to [facility].  Above information shared with Leroy Kennedy, [facility] Manager.  Please call with any questions or concerns.  Thank you for the opportunity to participate in this pt's care.  [name], BSN, RN [facility] [phone] (24h on call)

## 2021-02-02 NOTE — [place] FL2 (Addendum)
[place] MEDICAID FL2 LEVEL OF CARE SCREENING TOOL     IDENTIFICATION  Patient Name: Michele Green Birthdate: 1941/02/07 Sex: female Admission Date (Current Location): 01/24/2021  [facility] and [place] Number:  Herbalist and Address:  [facility], [place] [address] [place], [place] [address]      Provider Number: [id number]  Attending Physician Name and Address:  Thurnell Lose, MD  Relative Name and Phone Number:  Charmaine Placido (son) [phone]    Current Level of Care: Hospital Recommended Level of Care: [place] Prior Approval Number:    Date Approved/Denied:   PASRR Number: [id number]  Discharge Plan: Other (Comment) ([facility]))    Current Diagnoses: Patient Active Problem List   Diagnosis Date Noted  . Atrial tachycardia ([place])   . Adrenal insufficiency (Addison's disease) ([place]) 01/24/2021  . Anemia of chronic disease 01/24/2021  . Functional quadriplegia ([place]) 01/24/2021  . Hypotension 01/17/2021  . Elevated troponin 01/17/2021  . Debility 01/17/2021  . Chronic pain 01/17/2021  . Ulcerative colitis ([place]) 01/17/2021  . Chronic respiratory failure with hypoxia ([place]) 12/03/2020  . Pressure injury of skin 12/02/2020  . Pneumonia due to COVID-19 virus 12/01/2020  . T8 vertebral fracture ([place]) 09/13/2020  . Compression fracture of C-spine ([place]) 09/12/2020  . Compression fracture of thoracic spine, non-traumatic ([place]) 09/12/2020  . Goals of care, counseling/discussion   . Palliative care by specialist   . Spinal fracture of T7 vertebra ([place]) 08/17/2020  . Atrial fibrillation with RVR ([place]) 08/16/2020  . Long term (current) use of anticoagulants 08/28/2018  . Atrial flutter with rapid ventricular response ([place]) 08/14/2018  . 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 S/P Maze operation for atrial fibrillation 08/10/2018  . Chronic diastolic HF (heart failure) ([place]) 06/20/2018   . Osteoporosis 09/29/2017  . Loosening of hardware in spine ([place]) 08/23/2017  . Spinal stenosis of lumbar region 08/11/2016  . Unspecified cord compression ([place]) 08/11/2016  . Unstable burst fracture of third lumbar vertebra ([place]) 08/11/2016  . Genetic testing 03/17/2016  . Family history of breast cancer   . Breast cancer of upper-outer quadrant of left female breast ([place]) 02/02/2016  . On amiodarone therapy 09/09/2015  . On continuous oral anticoagulation 08/22/2015  . Persistent atrial fibrillation ([place]) 08/22/2015  . Essential hypertension 11/07/2013  . Mitral regurgitation 11/07/2013    Orientation RESPIRATION BLADDER Height & Weight     Self,Time,Situation,Place  O2 (4L Nasal Cannula) Continent Weight: 120 lb 2.4 oz (54.5 kg) Height:  5' 4"  (162.6 cm)  BEHAVIORAL SYMPTOMS/MOOD NEUROLOGICAL BOWEL NUTRITION STATUS      Continent Diet Regular diet  AMBULATORY STATUS COMMUNICATION OF NEEDS Skin   Extensive Assist Verbally    Skin tear knee right                   Personal Care Assistance Level of Assistance  Bathing,Feeding,Dressing Bathing Assistance: Maximum assistance Feeding assistance: Independent Dressing Assistance: Maximum assistance     Functional Limitations Info  Sight,Hearing,Speech Sight Info: Impaired Hearing Info: Impaired Speech Info: Adequate    [place] [facility]          Contractures Contractures Info: Not present    Additional Factors Info  Code Status,Allergies Code Status Info: DNR Allergies Info: Shellfish Allergy High Itching   Amoxicillin-pot Clavulanate Medium Diarrhea  Discharge Medications: Please see discharge summary for a l   Medication List        STOP taking these medications       metoprolol tartrate 50 MG tablet Commonly known as: LOPRESSOR             TAKE these medications       acetaminophen 500 MG tablet Commonly known as:  TYLENOL Take 500-1,000 mg by mouth every 6 (six) hours as needed for moderate pain or headache.   alendronate 70 MG tablet Commonly known as: FOSAMAX Take 70 mg by mouth once a week.   allopurinol 100 MG tablet Commonly known as: ZYLOPRIM Take 200 mg by mouth every evening.   atorvastatin 20 MG tablet Commonly known as: LIPITOR TAKE 1 TABLET BY MOUTH EVERY DAY   bisacodyl 10 MG suppository Commonly known as: DULCOLAX Place 1 suppository (10 mg total) rectally daily as needed for moderate constipation.   clonazePAM 0.5 MG disintegrating tablet Commonly known as: KLONOPIN Take 1 tablet (0.5 mg total) by mouth 2 (two) times daily as needed for seizure.   Digoxin 62.5 MCG Tabs Take 0.0625 mg by mouth daily. Start taking on: February 03, 2021   diltiazem 240 MG 24 hr capsule Commonly known as: CARDIZEM CD Take 1 capsule (240 mg total) by mouth daily. What changed:   medication strength  how much to take   furosemide 40 MG tablet Commonly known as: Lasix Take 1 tablet (40 mg total) by mouth daily.   hydrOXYzine 25 MG tablet Commonly known as: ATARAX/VISTARIL Take 25 mg by mouth in the morning and at bedtime.   mesalamine 0.375 g 24 hr capsule Commonly known as: APRISO Take 0.375 g by mouth daily.   multivitamin with minerals Tabs tablet Take 1 tablet by mouth daily.   oxyCODONE 5 MG immediate release tablet Commonly known as: Oxy IR/ROXICODONE Take 1 tablet (5 mg total) by mouth every 6 (six) hours as needed for severe pain.   Potassium Chloride ER 20 MEQ Tbcr Take 20 mEq by mouth daily.   predniSONE 5 MG tablet Commonly known as: DELTASONE Take 1 tablet daily for 5 days, then half tablet daily for 5 days, then stop. What changed:   medication strength  additional instructions   tamoxifen 20 MG tablet Commonly known as: NOLVADEX Take 20 mg by mouth daily.   thiamine 100 MG tablet Take 1 tablet (100 mg total) by mouth daily.   Xarelto  15 MG Tabs tablet Generic drug: Rivaroxaban TAKE 1 TABLET BY MOUTH  DAILY WITH SUPPER What changed:   how much to take  when to take this   ist of discharge medications.   Relevant Imaging Results:  Relevant Lab Results:   Additional Information ss# [phone] Pt will have Hospice with Emerson Murphy, LCSWA

## 2021-02-02 NOTE — Discharge Instructions (Signed)

## 2021-02-02 NOTE — Care Management Important Message (Signed)
Important Message  Patient Details  Name: Michele Green MRN: [id number] Date of Birth: [birth year]   Medicare Important Message Given:  Yes     [name] 02/02/2021, 9:15 AM

## 2021-02-02 NOTE — TOC Transition Note (Signed)
Transition of Care [facility]) - CM/SW Discharge Note   Patient Details  Name: Michele Green MRN: [id number] Date of Birth: 1941-10-17  Transition of Care [facility]) CM/SW Contact:  Tresa Endo Phone Number: 02/02/2021, 3:28 PM   Clinical Narrative:    Patient will DC to: [facility] Anticipated DC date: 02/02/2021 Family notified: Basilia Jumbo (Son) Transport by: Corey Harold   Per MD patient ready for DC to [facility] . RN to call report prior to discharge [phone]). RN, patient, patient's family, and facility notified of DC. Discharge Summary and FL2 sent to facility. DC packet on chart. Ambulance transport requested for patient.   CSW will sign off for now as social work intervention is no longer needed. Please consult [place] again if new needs arise.      Final next level of care: Assisted Living Barriers to Discharge: No Barriers Identified   Patient Goals and CMS Choice Patient states their goals for this hospitalization and ongoing recovery are:: To get better      Discharge Placement                       Discharge Plan and Services   Discharge Planning Services: CM Consult                                 Social Determinants of Health (SDOH) Interventions     Readmission Risk Interventions Readmission Risk Prevention Plan 01/19/2021  Medication Review ([place]) Complete  PCP or Specialist appointment within 3-5 days of discharge Complete  HRI or Home Care Consult Complete  SW Recovery Care/Counseling Consult Complete  Palliative Care Screening Complete  Skilled Nursing Facility Complete  Some recent data might be hidden

## 2021-02-02 NOTE — Plan of Care (Signed)
  Problem: Education: Goal: Knowledge of General Education information will improve Description: Including pain rating scale, medication(s)/side effects and non-pharmacologic comfort measures Outcome: Adequate for Discharge   

## 2021-02-02 NOTE — TOC Progression Note (Addendum)
Transition of Care [facility]) - Progression Note    Patient Details  Name: JENEE SPAUGH MRN: [id number] Date of Birth: 07-Jul-1941  Transition of Care [facility]) CM/SW [place], [place] Phone Number: 02/02/2021, 2:58 PM  Clinical Narrative:    9:49am CSW contacted pt son to confirm that he agreed with Palliative care to have bed taken apart and oxygen to come from home so that the new bed can be delivered. DC is pending bed and oxygen delivery, CSW has faxed over signed FL2 and DC Summary.   Expected Discharge Plan: Assisted Living Barriers to Discharge: No Barriers Identified  Expected Discharge Plan and Services Expected Discharge Plan: Assisted Living   Discharge Planning Services: CM Consult   Living arrangements for the past 2 months: [place] Expected Discharge Date: 02/02/21                                     Social Determinants of Health (SDOH) Interventions    Readmission Risk Interventions Readmission Risk Prevention Plan 01/19/2021  Medication Review ([place]) Complete  PCP or Specialist appointment within 3-5 days of discharge Complete  HRI or Home Care Consult Complete  SW Recovery Care/Counseling Consult Complete  Palliative Care Screening Complete  Skilled Nursing Facility Complete  Some recent data might be hidden

## 2021-02-02 NOTE — Progress Notes (Signed)
Progress Note  Patient Name: Michele Green Date of Encounter: 02/02/2021  [facility] Cardiologist: [name], MD   Subjective   Denies any SOB, Palpitations or chest pain.   Inpatient Medications    Scheduled Meds: . allopurinol  200 mg Oral QPM  . amiodarone  200 mg Oral Daily  . Chlorhexidine Gluconate Cloth  6 each Topical Daily  . digoxin  0.0625 mg Oral Daily  . feeding supplement  1 Container Oral BID BM  . furosemide  40 mg Oral Once  . hydrOXYzine  25 mg Oral BID  . mouth rinse  15 mL Mouth Rinse BID  . mesalamine  250 mg Oral BID  . potassium chloride  40 mEq Oral Once  . [START ON 02/03/2021] predniSONE  10 mg Oral Q breakfast  . Rivaroxaban  15 mg Oral QPC supper  . saccharomyces boulardii  250 mg Oral BID  . tamoxifen  20 mg Oral Daily   Continuous Infusions: . magnesium sulfate bolus IVPB 2 g (02/02/21 0848)   PRN Meds: acetaminophen **OR** [DISCONTINUED] acetaminophen, clonazepam, HYDROcodone-acetaminophen, lip balm, loperamide, sodium chloride flush   Vital Signs    Vitals:   02/01/21 2356 02/02/21 0431 02/02/21 0829 02/02/21 0845  BP: 117/63 107/61 119/63   Pulse: 90 83 86 81  Resp: 20 18 (!) 25   Temp: 97.7 F (36.5 C) 97.8 F (36.6 C) 98.3 F (36.8 C)   TempSrc: Oral Oral Oral   SpO2: 100% 100% 100%   Weight:  54.5 kg    Height:        Intake/Output Summary (Last 24 hours) at 02/02/2021 0925 Last data filed at 02/02/2021 0831 [place] per 24 hour  Intake 480 ml  Output 3100 ml  Net -2620 ml   Last 3 Weights 02/02/2021 02/01/2021 01/31/2021  Weight (lbs) 120 lb 2.4 oz 122 lb 5.7 oz 123 lb 10.9 oz  Weight (kg) 54.5 kg 55.5 kg 56.1 kg      Telemetry    Atrial tachycardia.  Rate 120 bpm.   Personally Reviewed  ECG    No new EKG to review- Personally Reviewed  Physical Exam   VS:  BP 119/63 (BP Location: Left Arm)   Pulse 81   Temp 98.3 F (36.8 C) (Oral)   Resp (!) 25   Ht 5' 4"  (1.626 m)   Wt 54.5 kg   SpO2 100%    BMI 20.62 kg/m  , BMI Body mass index is 20.62 kg/m. GEN: Well nourished, well developed in no acute distress HEENT: Normal NECK: No JVD; No carotid bruits LYMPHATICS: No lymphadenopathy CARDIAC:RRR, no murmurs, rubs, gallops RESPIRATORY:  Clear to auscultation without rales, wheezing or rhonchi  ABDOMEN: Soft, non-tender, non-distended MUSCULOSKELETAL:  No edema; No deformity  SKIN: Warm and dry NEUROLOGIC:  Alert and oriented x 3 PSYCHIATRIC:  Normal affect    Labs    High Sensitivity Troponin:   Recent Labs  Lab 01/16/21 1909 01/17/21 0524 01/24/21 1522 01/24/21 1707  TROPONINIHS 31* 30* 25* 24*      Chemistry Recent Labs  Lab 01/28/21 0610 01/29/21 0619 01/30/21 0312 01/30/21 1242 01/31/21 0509 02/01/21 0528 02/02/21 0358  NA 139 137 141   < > 139 137 137  K 3.7 3.1* 2.7*   < > 4.5 3.8 3.5  CL 109 102 99   < > 98 92* 90*  CO2 25 27 35*   < > 35* 39* 40*  GLUCOSE 89 111* 119*   < >  81 107* 136*  BUN 11 15 9    < > 12 16 24*  CREATININE 0.52 0.73 0.60   < > 0.71 0.73 0.86  CALCIUM 7.8* 7.8* 8.0*   < > 8.7* 8.5* 8.3*  PROT 4.8* 4.6* 4.8*  --   --   --   --   ALBUMIN 2.3* 2.2* 2.2*  --   --   --   --   AST 27 57* 43*  --   --   --   --   ALT 35 59* 57*  --   --   --   --   ALKPHOS 45 62 53  --   --   --   --   BILITOT 0.7 0.6 0.5  --   --   --   --   GFRNONAA >60 >60 >60   < > >60 >60 >60  ANIONGAP 5 8 7    < > 6 6 7    < > = values in this interval not displayed.     Hematology Recent Labs  Lab 01/28/21 0610 01/29/21 0619 01/30/21 0312  WBC 12.0* 9.0 8.6  RBC 3.26* 3.15* 3.37*  HGB 9.9* 9.4* 10.1*  HCT 30.9* 30.1* 31.4*  MCV 94.8 95.6 93.2  MCH 30.4 29.8 30.0  MCHC 32.0 31.2 32.2  RDW 17.0* 16.5* 16.0*  PLT 121* 113* 111*    BNP No results for input(s): BNP, PROBNP in the last 168 hours.   DDimer No results for input(s): DDIMER in the last 168 hours.   Radiology    No results found.  Cardiac Studies   Echo 01/17/21:  1. s/p Mitral  Valve Repair. Procedure Date: 08/10/2018.  2. Right ventricular systolic function is normal. The right ventricular  size is normal. There is moderately elevated pulmonary artery systolic  pressure. The estimated right ventricular systolic pressure is 50.2 mmHg.  3. Left atrial size was severely dilated.  4. The aortic valve is tricuspid. Aortic valve regurgitation is not  visualized. No aortic stenosis is present.  5. Tricuspid valve regurgitation is moderate.  6. Left ventricular ejection fraction, by estimation, is 60 to 65%. The  left ventricle has normal function. The left ventricle has no regional  wall motion abnormalities. Left ventricular diastolic parameters are  indeterminate.  7. The inferior vena cava is normal in size with <50% respiratory  variability, suggesting right atrial pressure of 8 mmHg.   Patient Profile     79 y.o. female with CAD status post CABG, mitral vegetation status post repair, atrial fibrillation status post maze, recurrent atrial fibrillation, hypertension, hyperlipidemia, and chronic hypoxic respiratory failure on home O2 admitted with acute on chronic hypoxia and found to be in atrial fibrillation with RVR.  Assessment & Plan    # Persistent atrial fibrillation: # Atrial tachycardia: Ms. Zhao has struggled to maintain sinus rhythm.  She has undergone a MAZE procedure and was back in atrial fibrillation this admission.  Digoxin levels were elevated so it has been discontinued.  Due to her overall debility she is not a candidate for ablation with pacemaker implantation.  -will transition back to Amio 235m daily -no BB or CCB due to hypotension >>these were stopped -she was previously on digoxin 0.125 mg daily Monday through Friday with none on the weekends.   -Her digoxin levels were elevated but she was asymptomatic.   -It was felt that she since she is going home with palliative care and comfort measures, the best way to control  her HR was with  Amio and dig so her digoxin was restarted at 0.0625 mg daily.   # Acute diastolic heart failure:  -she put out 3.1L today and is net neg 4.7L -continue lasix 22m po daily.  -SCr 3.5 >>replete to keep > 4 p[er TRH   # CAD s/p CABG:  Not an active issue.  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  [facility] will sign off.   Medication Recommendations:  Digoxin 0.06292mdaily, Lasix 40108maily and Amio 200m2mily Other recommendations (labs, testing, etc):  none Follow up as an outpatient:  TOC followup in 7-10 days  I have spent a total of 35 minutes with patient reviewing telemetry, EKGs, labs and examining patient as well as establishing an assessment and plan that was discussed with the patient.  > 50% of time was spent in direct patient care.    For questions or updates, please contact [facility] consult [email or web address] for contact info under        Signed, TracFransico Him  02/02/2021, 9:25 AM

## 2021-02-02 NOTE — Discharge Summary (Addendum)
Michele Green [facility]:[id number] DOB: 09-08-1941 DOA: 01/24/2021  PCP: [name], MD  Admit date: 01/24/2021  Discharge date: 02/02/2021  Admitted From: ALF  Disposition:  SNF   Recommendations for Outpatient Follow-up:   Follow up with PCP in 1-2 weeks  PCP Please obtain BMP/CBC, 2 view CXR in 1week,  (see Discharge instructions)   PCP Please follow up on the following pending results: Check CBC, BMP, 2 view chest x-ray, digoxin level in 7 to 10 days   Home Health: None   Equipment/Devices: None  Consultations: Cardiology, palliative care, Ortho Discharge Condition: Fair CODE STATUS: DNR   Diet Recommendation: Heart Healthy with 1.5 L total fluid restriction per day  CC - SOB   Brief history of present illness from the day of admission and additional interim summary    Patient is a 80 y.o. female persistent A. Fib,?  Steroid-induced relative adrenal insufficiency,chronic diastolic heart failure, ulcerative colitis, mitral valve repair/maze procedure, history of breast cancer-just discharged from the hospital on 3/2 after being treated for A. fib/hypotension-brought to the ED on 3/5 with A. fib with RVR associated with hypotension.  She was transferred to [facility] underwent DC cardioversion on 3/10.  See below for further details  Significant events: 1/10-1/20>> hospitalized for Covid pneumonia-hypoxia-A. fib RVR 2/25-3/2>> hospitalized for A. fib RVR-hypotension-steroid induced adrenal insufficiency-d/c'd to SNF 3/5>> admit to [facility] for A. fib with RVR/hypotension 3/10>> transferred to [facility] for DC cardioversion 3/10>> cardioverted-sinus rhythm-but reverted to atrial flutter/atrial tachycardia  Significant studies: 2/26>> random a.m. cortisol 11.7-felt to be disproportionately low-patient  hypotensive (see note 2/27) 2/26>> Echo: EF 60-65%, RVSP 52.9 3/5>> chest x-ray: No pneumonia 3/5>> x-ray right elbow: Moderate effusion  Antimicrobial therapy: Rocephin: 3/5>>3/10  Microbiology data: 3/5>> blood culture: No growth 3/5>> urine culture: Multiple species present  Procedures : 3/10>> DC cardioversion  Consults: Cardiology, EP, orthopedics                                                                  Hospital Course     Persistent A. fib/a flutter/atrial tachycardia with RVR: Failed DC cardioversion on 3/10-was seen by cardiology placed on amiodarone infusion, other medications were held as she was hypotensive initially, she was given IV digoxin on 02/18/2021 with good rate control, at this point since blood pressures have improved have placed her on low-dose oral digoxin along with oral Cardizem, blood pressure much better and CHF much better.  Will be discharged to SNF please monitor heart rate, blood pressure, diuretic dose and digoxin dose closely.  Of note for her RVR amiodarone infusion was not effective at all hydrate came under control after she received IV digoxin ordered by me yesterday in the afternoon.   Hypotension: Thought to be due to relative adrenal insufficiency-much improved.  Relative  adrenal insufficiency: Due to significant steroid use over the past several weeks-much improved on low-dose steroids which will be gradually tapered over the next 2 weeks.    Acute on chronic diastolic heart failure:  Due to RVR, much improved volume status now on oral Lasix and potassium supplementation, monitor blood pressure, BMP, diuretic dose closely at SNF.  Complicated UTI: Has finished Rocephin course.    Hypokalemia: Repleted  Hypomagnesemia: Repleted  HLD: Continue statin  Anxiety: Continue as needed Klonopin.  History of CAD s/p CABG: No anginal symptoms  History of mitral valve repair-Maze procedure  History of breast cancer:  On tamoxifen which for now has been stopped per palliative care recommendations if patient does well clinically in the next 3 to 4 months there is a good chance she might graduate out of hospice and this medication at that point can be resumed.  History of ulcerative colitis: on mesalamine-apparently has had few loose stools intermittently.   Right elbow effusion: Pain/swelling has resolved-evaluated by orthopedics-recommended conservative management-no need for arthrocentesis per orthopedics.  Deconditioning/debility/functional quadriplegia: PT/OT following-SNF.    Goals of care: DNR in place-discussed with patient's son over the phone on 3/11-he does not desire heroic/aggressive measures-does not want to proceed with PPM placement/ablation etc. She has been seen by palliative care and will be discharged to SNF with hospice follow-up, goal of care is to continue gentle medical treatment if significant decline then focus fully on comfort.   Discharge diagnosis     Active Problems:   On continuous oral anticoagulation   Persistent atrial fibrillation (HCC)   Breast cancer of upper-outer quadrant of left female breast (HCC)   Chronic diastolic HF (heart failure) (HCC)   Atrial fibrillation with RVR (HCC)   Hypotension   Elevated troponin   Ulcerative colitis (HCC)   Adrenal insufficiency (Addison's disease) (HCC)   Anemia of chronic disease   Functional quadriplegia (HCC)   Atrial tachycardia ([place])    Discharge instructions    Discharge Instructions    Diet - low sodium heart healthy   Complete by: As directed    Discharge instructions   Complete by: As directed    Follow with Primary MD [name], MD in 7 days   Get CBC, CMP, 2 view Chest X ray -  checked next visit within 1 week by Primary MD or SNF MD   Activity: As tolerated with Full fall precautions use walker/cane & assistance as needed  Disposition SNF  Diet: Heart Healthy            Check your Weight  same time everyday, if you gain over 2 pounds, or you develop in leg swelling, experience more shortness of breath or chest pain, call your Primary MD immediately. Follow Cardiac Low Salt Diet and 1.5 lit/day fluid restriction.  Special Instructions: If you have smoked or chewed Tobacco  in the last 2 yrs please stop smoking, stop any regular Alcohol  and or any Recreational drug use.  On your next visit with your primary care physician please Get Medicines reviewed and adjusted.  Please request your Prim.MD to go over all Hospital Tests and Procedure/Radiological results at the follow up, please get all Hospital records sent to your Prim MD by signing hospital release before you go home.  If you experience worsening of your admission symptoms, develop shortness of breath, life threatening emergency, suicidal or homicidal thoughts you must seek medical attention immediately by calling 911 or calling your MD immediately  if symptoms less  severe.  You Must read complete instructions/literature along with all the possible adverse reactions/side effects for all the Medicines you take and that have been prescribed to you. Take any new Medicines after you have completely understood and accpet all the possible adverse reactions/side effects.   Do not drive, operate heavy machinery, perform activities at heights, swimming or participation in water activities or provide baby sitting services if your were admitted for syncope or siezures until you have seen by Primary MD or a Neurologist and advised to do so again.   Discharge wound care:   Complete by: As directed    Keep any skin tear wound clean and dry, apply dry dressing daily if needed   Increase activity slowly   Complete by: As directed       Discharge Medications   Allergies as of 02/02/2021      Reactions   Shellfish Allergy Itching   Amoxicillin-pot Clavulanate Diarrhea      Medication List    STOP taking these medications    metoprolol tartrate 50 MG tablet Commonly known as: LOPRESSOR     TAKE these medications   acetaminophen 500 MG tablet Commonly known as: TYLENOL Take 500-1,000 mg by mouth every 6 (six) hours as needed for moderate pain or headache.   alendronate 70 MG tablet Commonly known as: FOSAMAX Take 70 mg by mouth once a week.   allopurinol 100 MG tablet Commonly known as: ZYLOPRIM Take 200 mg by mouth every evening.   atorvastatin 20 MG tablet Commonly known as: LIPITOR TAKE 1 TABLET BY MOUTH EVERY DAY   bisacodyl 10 MG suppository Commonly known as: DULCOLAX Place 1 suppository (10 mg total) rectally daily as needed for moderate constipation.   clonazePAM 0.5 MG disintegrating tablet Commonly known as: KLONOPIN Take 1 tablet (0.5 mg total) by mouth 2 (two) times daily as needed for seizure.   Digoxin 62.5 MCG Tabs Take 0.0625 mg by mouth daily. Start taking on: February 03, 2021   diltiazem 240 MG 24 hr capsule Commonly known as: CARDIZEM CD Take 1 capsule (240 mg total) by mouth daily. What changed:   medication strength  how much to take   furosemide 40 MG tablet Commonly known as: Lasix Take 1 tablet (40 mg total) by mouth daily.   hydrOXYzine 25 MG tablet Commonly known as: ATARAX/VISTARIL Take 25 mg by mouth in the morning and at bedtime.   mesalamine 0.375 g 24 hr capsule Commonly known as: APRISO Take 0.375 g by mouth daily.   multivitamin with minerals Tabs tablet Take 1 tablet by mouth daily.   oxyCODONE 5 MG immediate release tablet Commonly known as: Oxy IR/ROXICODONE Take 1 tablet (5 mg total) by mouth every 6 (six) hours as needed for severe pain.   Potassium Chloride ER 20 MEQ Tbcr Take 20 mEq by mouth daily.   predniSONE 5 MG tablet Commonly known as: DELTASONE Take 1 tablet daily for 5 days, then half tablet daily for 5 days, then stop. What changed:   medication strength  additional instructions   tamoxifen 20 MG tablet Commonly  known as: NOLVADEX Take 20 mg by mouth daily.   thiamine 100 MG tablet Take 1 tablet (100 mg total) by mouth daily.   Xarelto 15 MG Tabs tablet Generic drug: Rivaroxaban TAKE 1 TABLET BY MOUTH  DAILY WITH SUPPER What changed:   how much to take  when to take this            Discharge Care  Instructions  (From admission, onward)         Start     Ordered   02/02/21 0000  Discharge wound care:       Comments: Keep any skin tear wound clean and dry, apply dry dressing daily if needed   02/02/21 0919            Major procedures and Radiology Reports - PLEASE review detailed and final reports thoroughly  -        DG Elbow 2 Views Right  Result Date: 01/24/2021 CLINICAL DATA:  Provided history of: Pain on the olcer Patient reports pain about the ulnar aspect of right elbow for 1 week. EXAM: RIGHT ELBOW - 2 VIEW COMPARISON:  None. FINDINGS: Frontal and lateral views. Normal alignment and joint spaces. No fracture, erosion, or bone destruction. There is a moderate elbow joint effusion. Soft tissue edema is most prominent about the posterior and ulnar aspect of the elbow. IMPRESSION: Moderate elbow joint effusion, nonspecific. Generalized soft tissue edema most prominent posterior medially. Electronically Signed   By: Keith Rake M.D.   On: 01/24/2021 17:26   CT Angio Chest PE W and/or Wo Contrast  Result Date: 01/16/2021 CLINICAL DATA:  Shortness of breath EXAM: CT ANGIOGRAPHY CHEST WITH CONTRAST TECHNIQUE: Multidetector CT imaging of the chest was performed using the standard protocol during bolus administration of intravenous contrast. Multiplanar CT image reconstructions and MIPs were obtained to evaluate the vascular anatomy. CONTRAST:  136m OMNIPAQUE IOHEXOL 350 MG/ML SOLN COMPARISON:  None. FINDINGS: Cardiovascular: There is a optimal opacification of the pulmonary arteries. There is no central,segmental, or subsegmental filling defects within the pulmonary arteries.  There is moderate cardiomegaly present. No pericardial effusion or thickening. No evidence right heart strain. There is normal three-vessel brachiocephalic anatomy without proximal stenosis. Scattered aortic atherosclerosis is seen. Mediastinum/Nodes: No hilar, mediastinal, or axillary adenopathy. Thyroid gland, trachea, and esophagus demonstrate no significant findings. Lungs/Pleura: Small bilateral pleural effusions are present, right greater than left. Interlobular septal thickening is noted. There is ground-glass streaky airspace opacity seen predominantly at both lung bases. No pneumothorax is noted. Upper Abdomen: No acute abnormalities present in the visualized portions of the upper abdomen. Contrast reflux seen within the IVC. Musculoskeletal: No chest wall abnormality. No acute or significant osseous findings. Overlying median sternotomy wires are present. Review of the MIP images confirms the above findings. IMPRESSION: 1. No central, segmental, or subsegmental pulmonary embolism 2. Small bilateral pleural effusions, right greater than left 3. Findings suggestive of interstitial edema 4. Ground-glass patchy airspace opacities at both lung bases which could be due to inflammatory etiology or atelectasis. 5.  Aortic Atherosclerosis (ICD10-I70.0). Electronically Signed   By: BPrudencio PairM.D.   On: 01/16/2021 22:13   DG Chest Port 1 View  Result Date: 01/24/2021 CLINICAL DATA:  Looking for infectious source. Recent UTI last week. Continued dysuria. Swelling and weeping of right upper arm. EXAM: PORTABLE CHEST 1 VIEW COMPARISON:  Chest x-ray 01/16/2021, CT chest 01/16/2021 FINDINGS: Left appendage clip, mitral annular valve replacement, as well as sternotomy wires are again noted. The heart size and mediastinal contours are within normal limits. Aortic arch calcifications. Biapical pleural/pulmonary scarring. No focal consolidation. Similar appearing coarsened interstitial markings. Persistent bilateral  trace to small volume pleural effusions with streaky and patchy airspace opacities. No pneumothorax. No acute osseous abnormality. Mid to lower thoracic body kyphoplasty again noted. IMPRESSION: Persistent bilateral trace to small volume pleural effusions with underlying infection/inflammation not excluded. Electronically Signed  By: Iven Finn M.D.   On: 01/24/2021 15:55   DG Chest Port 1 View  Result Date: 01/16/2021 CLINICAL DATA:  Shortness of breath. EXAM: PORTABLE CHEST 1 VIEW COMPARISON:  December 01, 2020. FINDINGS: Stable cardiomediastinal silhouette. No pneumothorax is noted. Status post cardiac valve repair. Mild left basilar atelectasis, infiltrate or scarring is noted with probable small loculated left pleural effusion. Minimal right basilar subsegmental atelectasis is noted with small pleural effusion. Bony thorax is unremarkable. IMPRESSION: Mild left basilar atelectasis, infiltrate or scarring is noted with probable small loculated left pleural effusion. Minimal right basilar subsegmental atelectasis is noted with small pleural effusion. Aortic Atherosclerosis (ICD10-I70.0). Electronically Signed   By: Marijo Conception M.D.   On: 01/16/2021 18:25   ECHOCARDIOGRAM COMPLETE  Result Date: 01/17/2021    ECHOCARDIOGRAM REPORT   Patient Name:   Michele Green Date of Exam: 01/17/2021 Medical Rec #:  [id number]        Height:       64.0 in Accession #:    [id number]       Weight:       126.5 lb Date of Birth:  [birth year]        BSA:          1.611 m Patient Age:    80 years         BP:           95/59 mmHg Patient Gender: F                HR:           80 bpm. Exam Location:  Inpatient Procedure: 2D Echo Indications:    Heart Block, 2nd degree 144.1; Hypotension  History:        Patient has prior history of Echocardiogram examinations, most                 recent 09/05/2019. Prior CABG, Arrythmias:Atrial Flutter; Risk                 Factors:Hypertension and Dyslipidemia.                  Mitral  Valve: Mitral Valve Repair. Procedure Date: 08/10/2018.  Sonographer:    Mikki Santee RDCS (AE) Referring Phys: [id number] [place]  1. s/p Mitral Valve Repair. Procedure Date: 08/10/2018.  2. Right ventricular systolic function is normal. The right ventricular size is normal. There is moderately elevated pulmonary artery systolic pressure. The estimated right ventricular systolic pressure is 02.6 mmHg.  3. Left atrial size was severely dilated.  4. The aortic valve is tricuspid. Aortic valve regurgitation is not visualized. No aortic stenosis is present.  5. Tricuspid valve regurgitation is moderate.  6. Left ventricular ejection fraction, by estimation, is 60 to 65%. The left ventricle has normal function. The left ventricle has no regional wall motion abnormalities. Left ventricular diastolic parameters are indeterminate.  7. The inferior vena cava is normal in size with <50% respiratory variability, suggesting right atrial pressure of 8 mmHg. Comparison(s): A prior study was performed on 09/05/2019. Prior images reviewed side by side. Compared to prior increase in left atrial size, mitral regurgitation, pulmonic regurgitation, and tricuspid regurgitation. FINDINGS  Left Ventricle: Left ventricular ejection fraction, by estimation, is 60 to 65%. The left ventricle has normal function. The left ventricle has no regional wall motion abnormalities. The left ventricular internal cavity size was normal in size. There is  no left ventricular hypertrophy. Left ventricular  diastolic parameters are indeterminate. Right Ventricle: The right ventricular size is normal. No increase in right ventricular wall thickness. Right ventricular systolic function is normal. There is moderately elevated pulmonary artery systolic pressure. The tricuspid regurgitant velocity is 3.35 m/s, and with an assumed right atrial pressure of 8 mmHg, the estimated right ventricular systolic pressure is 32.2 mmHg. Left Atrium: Left  atrial size was severely dilated. Right Atrium: Right atrial size was normal in size. Pericardium: There is no evidence of pericardial effusion. Mitral Valve: The mitral valve has been repaired/replaced. Moderate mitral valve regurgitation. There is a present in the mitral position. Procedure Date: 08/10/2018. 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 MV peak gradient, 19.4 mmHg. The mean mitral valve gradient is 5.5 mmHg with average heart rate of 72 bpm. Tricuspid Valve: The tricuspid valve is normal in structure. Tricuspid valve regurgitation is moderate. Aortic Valve: The aortic valve is tricuspid. There is moderate aortic valve annular calcification. Aortic valve regurgitation is not visualized. No aortic stenosis is present. Pulmonic Valve: The pulmonic valve was grossly normal. Pulmonic valve regurgitation is mild to moderate. No evidence of pulmonic stenosis. Aorta: The aortic root is normal in size and structure and the ascending aorta was not well visualized. Venous: The inferior vena cava is normal in size with less than 50% respiratory variability, suggesting right atrial pressure of 8 mmHg. IAS/Shunts: The atrial septum is grossly normal.  LEFT VENTRICLE PLAX 2D LVIDd:         4.20 cm LVIDs:         2.60 cm LV PW:         0.90 cm LV IVS:        1.10 cm LVOT diam:     2.00 cm LV SV:         57 LV SV Index:   36 LVOT Area:     3.14 cm  RIGHT VENTRICLE TAPSE (M-mode): 0.9 cm LEFT ATRIUM              Index       RIGHT ATRIUM           Index LA diam:        4.80 cm  2.98 cm/m  RA Area:     19.70 cm LA Vol (A2C):   134.0 ml 83.20 ml/m RA Volume:   48.70 ml  30.24 ml/m LA Vol (A4C):   136.0 ml 84.44 ml/m LA Biplane Vol: 140.0 ml 86.93 ml/m  AORTIC VALVE LVOT Vmax:   112.00 cm/s LVOT Vmean:  71.000 cm/s LVOT VTI:    0.183 m  AORTA Ao Root diam: 3.00 cm MITRAL VALVE             TRICUSPID VALVE MV Area (PHT): 3.01 cm  TR Peak grad:   44.9 mmHg MV Area VTI:   0.83 cm  TR Vmax:        335.00 cm/s MV Peak  grad:  19.4 mmHg MV Mean grad:  5.5 mmHg  SHUNTS MV Vmax:       2.20 m/s  Systemic VTI:  0.18 m MV Vmean:      97.6 cm/s Systemic Diam: 2.00 cm Rudean Haskell MD Electronically signed by Rudean Haskell MD Signature Date/Time: 01/17/2021/1:15:38 PM    Final     Micro Results     Recent Results (from the past 240 hour(s))  Blood culture (routine x 2)     Status: None   Collection Time: 01/24/21  3:13 PM   Specimen: BLOOD  Result Value Ref Range Status   Specimen Description   Final    BLOOD BLOOD LEFT ARM Performed at [facility] [address], [place], [place] [address]    Special Requests   Final    BOTTLES DRAWN AEROBIC AND ANAEROBIC Blood Culture adequate volume Performed at [facility] [address], [place], [place] [address]    Culture   Final    NO GROWTH 5 DAYS Performed at [facility], [place] [address] [place], [place] [address]    Report Status 01/30/2021 FINAL  Final  Blood culture (routine x 2)     Status: None   Collection Time: 01/24/21  3:22 PM   Specimen: BLOOD  Result Value Ref Range Status   Specimen Description   Final    BLOOD LEFT ARM Performed at [facility] [address] [place], [place] [address]    Special Requests   Final    BOTTLES DRAWN AEROBIC AND ANAEROBIC Blood Culture adequate volume Performed at [facility] [address], [place], [place] [address]    Culture   Final    NO GROWTH 5 DAYS Performed at [facility], [place] [address] [place], [place] [address]    Report Status 01/29/2021 FINAL  Final  Resp Panel by RT-PCR (Flu A&B, Covid) Nasopharyngeal Swab     Status: None   Collection Time: 01/24/21  5:27 PM   Specimen: Nasopharyngeal Swab; Nasopharyngeal(NP) swabs in vial transport medium  Result Value Ref Range Status   SARS Coronavirus 2 by RT PCR NEGATIVE NEGATIVE Final    Comment: (NOTE) SARS-CoV-2 target nucleic acids are  NOT DETECTED.  The SARS-CoV-2 RNA is generally detectable in upper respiratory specimens during the acute phase of infection. The lowest concentration of SARS-CoV-2 viral copies this assay can detect is 138 copies/mL. A negative result does not preclude SARS-Cov-2 infection and should not be used as the sole basis for treatment or other patient management decisions. A negative result may occur with  improper specimen collection/handling, submission of specimen other than nasopharyngeal swab, presence of viral mutation(s) within the areas targeted by this assay, and inadequate number of viral copies(<138 copies/mL). A negative result must be combined with clinical observations, patient history, and epidemiological information. The expected result is Negative.  Fact Sheet for Patients:  EntrepreneurPulse.com.au  Fact Sheet for Healthcare Providers:  IncredibleEmployment.be  This test is no t yet approved or cleared by the [place] FDA and  has been authorized for detection and/or diagnosis of SARS-CoV-2 by FDA under an Emergency Use Authorization (EUA). This EUA will remain  in effect (meaning this test can be used) for the duration of the COVID-19 declaration under Section 564(b)(1) of the Act, 21 U.S.C.section 360bbb-3(b)(1), unless the authorization is terminated  or revoked sooner.       Influenza A by PCR NEGATIVE NEGATIVE Final   Influenza B by PCR NEGATIVE NEGATIVE Final    Comment: (NOTE) The Xpert Xpress SARS-CoV-2/FLU/RSV plus assay is intended as an aid in the diagnosis of influenza from Nasopharyngeal swab specimens and should not be used as a sole basis for treatment. Nasal washings and aspirates are unacceptable for Xpert Xpress SARS-CoV-2/FLU/RSV testing.  Fact Sheet for Patients: EntrepreneurPulse.com.au  Fact Sheet for Healthcare Providers: IncredibleEmployment.be  This test is not  yet approved or cleared by the [place] FDA and has been authorized for detection and/or diagnosis of SARS-CoV-2 by FDA under an Emergency Use  Authorization (EUA). This EUA will remain in effect (meaning this test can be used) for the duration of the COVID-19 declaration under Section 564(b)(1) of the Act, 21 U.S.C. section 360bbb-3(b)(1), unless the authorization is terminated or revoked.  Performed at [facility], [place] [address], [place], [place] [address]   Culture, Urine     Status: Abnormal   Collection Time: 01/24/21  5:35 PM   Specimen: Urine, Catheterized  Result Value Ref Range Status   Specimen Description   Final    URINE, CATHETERIZED Performed at [facility] [address], [place], [place] [address]    Special Requests   Final    NONE Performed at [facility], [place] [address], [place], [place] [address]    Culture MULTIPLE SPECIES PRESENT, SUGGEST RECOLLECTION (A)  Final   Report Status 01/26/2021 FINAL  Final    Today   Subjective    August Saucer today has no headache,no chest abdominal pain,no new weakness tingling or numbness, feels much better     Objective   Blood pressure 119/63, pulse 81, temperature 98.3 F (36.8 C), temperature source Oral, resp. rate (!) 25, height 5' 4"  (1.626 m), weight 54.5 kg, SpO2 100 %.   Intake/Output Summary (Last 24 hours) at 02/02/2021 0939 Last data filed at 02/02/2021 0831 Gross per 24 hour  Intake 480 ml  Output 3100 ml  Net -2620 ml    Exam  Awake Alert, No new F.N deficits, Normal affect [place].AT,PERRAL Supple Neck,No JVD, No cervical lymphadenopathy appriciated.  Symmetrical Chest wall movement, Good air movement bilaterally, CTAB RRR,No Gallops,Rubs or new Murmurs, No Parasternal Heave +ve B.Sounds, Abd Soft, Non tender, No organomegaly appriciated, No rebound -guarding or rigidity. No Cyanosis, trace edema   Data Review   CBC w Diff:   Lab Results  Component Value Date   WBC 8.6 01/30/2021   HGB 10.1 (L) 01/30/2021   HGB 13.0 10/30/2019   HGB 12.7 09/30/2017   HCT 31.4 (L) 01/30/2021   HCT 38.7 10/30/2019   HCT 37.2 09/30/2017   PLT 111 (L) 01/30/2021   PLT 120 (L) 10/30/2019   LYMPHOPCT 5 01/25/2021   LYMPHOPCT 14.9 09/30/2017   MONOPCT 3 01/25/2021   MONOPCT 9.4 09/30/2017   EOSPCT 0 01/25/2021   EOSPCT 1.3 09/30/2017   BASOPCT 0 01/25/2021   BASOPCT 0.9 09/30/2017    CMP:  Lab Results  Component Value Date   NA 137 02/02/2021   NA 140 10/30/2019   NA 143 09/30/2017   K 3.5 02/02/2021   K 4.3 09/30/2017   CL 90 (L) 02/02/2021   CO2 40 (H) 02/02/2021   CO2 25 09/30/2017   BUN 24 (H) 02/02/2021   BUN 21 10/30/2019   BUN 14.5 09/30/2017   CREATININE 0.86 02/02/2021   CREATININE 0.92 08/01/2019   CREATININE 0.8 09/30/2017   PROT 4.8 (L) 01/30/2021   PROT 6.7 06/14/2018   PROT 7.0 09/30/2017   ALBUMIN 2.2 (L) 01/30/2021   ALBUMIN 4.0 06/14/2018   ALBUMIN 3.7 09/30/2017   BILITOT 0.5 01/30/2021   BILITOT 0.6 08/01/2019   BILITOT 0.87 09/30/2017   ALKPHOS 53 01/30/2021   ALKPHOS 90 09/30/2017   AST 43 (H) 01/30/2021   AST 19 08/01/2019   AST 20 09/30/2017   ALT 57 (H) 01/30/2021   ALT 10 08/01/2019   ALT 15 09/30/2017  .   Total Time in preparing paper work, data evaluation and todays exam - 35 minutes  Lala Lund M.D on 02/02/2021 at 9:39 AM  [facility]

## 2021-02-02 NOTE — TOC Progression Note (Signed)
Transition of Care [facility]) - Progression Note    Patient Details  Name: Michele Green MRN: [id number] Date of Birth: 07/18/1941  Transition of Care [facility]) CM/SW [place], [place] Phone Number: 02/02/2021, 3:30 PM  Clinical Narrative:    3:09pm Palliative contacted CSW to update on bed delivery. CSW contacted pt son to let him know the bed was delivered and Corey Harold has been called.    Expected Discharge Plan: Assisted Living Barriers to Discharge: No Barriers Identified  Expected Discharge Plan and Services Expected Discharge Plan: Assisted Living   Discharge Planning Services: CM Consult   Living arrangements for the past 2 months: [place] Expected Discharge Date: 02/02/21                                     Social Determinants of Health (SDOH) Interventions    Readmission Risk Interventions Readmission Risk Prevention Plan 01/19/2021  Medication Review ([place]) Complete  PCP or Specialist appointment within 3-5 days of discharge Complete  HRI or Home Care Consult Complete  SW Recovery Care/Counseling Consult Complete  Palliative Care Screening Complete  Skilled Nursing Facility Complete  Some recent data might be hidden

## 2021-02-02 NOTE — NC FL2 (Deleted)
[place] MEDICAID FL2 LEVEL OF CARE SCREENING TOOL     IDENTIFICATION  Patient Name: Michele Green Birthdate: 1941-08-24 Sex: female Admission Date (Current Location): 01/24/2021  [facility] and [place] Number:  Herbalist and Address:  [facility] [place], [place] [address] [place],  [address]      Provider Number: [id number]  Attending Physician Name and Address:  Thurnell Lose, MD  Relative Name and Phone Number:  Debraann Livingstone (son) [phone]    Current Level of Care: Hospital Recommended Level of Care: [place] Prior Approval Number:    Date Approved/Denied:   PASRR Number: [id number]  Discharge Plan: Other (Comment) ([facility]))    Current Diagnoses: Patient Active Problem List   Diagnosis Date Noted  . Atrial tachycardia ([place])   . Adrenal insufficiency (Addison's disease) ([place]) 01/24/2021  . Anemia of chronic disease 01/24/2021  . Functional quadriplegia ([place]) 01/24/2021  . Hypotension 01/17/2021  . Elevated troponin 01/17/2021  . Debility 01/17/2021  . Chronic pain 01/17/2021  . Ulcerative colitis ([place]) 01/17/2021  . Chronic respiratory failure with hypoxia ([place]) 12/03/2020  . Pressure injury of skin 12/02/2020  . Pneumonia due to COVID-19 virus 12/01/2020  . T8 vertebral fracture ([place]) 09/13/2020  . Compression fracture of C-spine ([place]) 09/12/2020  . Compression fracture of thoracic spine, non-traumatic ([place]) 09/12/2020  . Goals of care, counseling/discussion   . Palliative care by specialist   . Spinal fracture of T7 vertebra ([place]) 08/17/2020  . Atrial fibrillation with RVR ([place]) 08/16/2020  . Long term (current) use of anticoagulants 08/28/2018  . Atrial flutter with rapid ventricular response ([place]) 08/14/2018  . 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 S/P Maze operation for atrial fibrillation 08/10/2018  . Chronic diastolic HF (heart failure) ([place]) 06/20/2018   . Osteoporosis 09/29/2017  . Loosening of hardware in spine ([place]) 08/23/2017  . Spinal stenosis of lumbar region 08/11/2016  . Unspecified cord compression ([place]) 08/11/2016  . Unstable burst fracture of third lumbar vertebra ([place]) 08/11/2016  . Genetic testing 03/17/2016  . Family history of breast cancer   . Breast cancer of upper-outer quadrant of left female breast ([place]) 02/02/2016  . On amiodarone therapy 09/09/2015  . On continuous oral anticoagulation 08/22/2015  . Persistent atrial fibrillation ([place]) 08/22/2015  . Essential hypertension 11/07/2013  . Mitral regurgitation 11/07/2013    Orientation RESPIRATION BLADDER Height & Weight     Self,Time,Situation,Place  O2 (4L Nasal Cannula) Continent Weight: 120 lb 2.4 oz (54.5 kg) Height:  5' 4"  (162.6 cm)  BEHAVIORAL SYMPTOMS/MOOD NEUROLOGICAL BOWEL NUTRITION STATUS      Continent Diet (See DC Summary)  AMBULATORY STATUS COMMUNICATION OF NEEDS Skin   Extensive Assist Verbally PU Stage and Appropriate Care                       Personal Care Assistance Level of Assistance  Bathing,Feeding,Dressing Bathing Assistance: Maximum assistance Feeding assistance: Independent Dressing Assistance: Maximum assistance     Functional Limitations Info  Sight,Hearing,Speech Sight Info: Impaired Hearing Info: Impaired Speech Info: Adequate    SPECIAL CARE FACTORS FREQUENCY  PT (By licensed PT),OT (By licensed OT)     PT Frequency: 5x a week OT Frequency: 5x a weeek            Contractures Contractures Info: Not present    Additional Factors Info  Code Status,Allergies Code Status Info: DNR Allergies  Info: Shellfish Allergy High Itching   Amoxicillin-pot Clavulanate Medium Diarrhea           Current Medications (02/02/2021):  This is the current hospital active medication list Current Facility-Administered Medications  Medication Dose Route Frequency Provider Last Rate Last Admin  . acetaminophen (TYLENOL) tablet  1,000 mg  1,000 mg Oral Q6H PRN Sharion Settler, NP   1,000 mg at 01/28/21 2109  . allopurinol (ZYLOPRIM) tablet 200 mg  200 mg Oral QPM Hongalgi, Anand D, MD   200 mg at 02/01/21 1721  . amiodarone (PACERONE) tablet 200 mg  200 mg Oral Daily Skeet Latch, MD   200 mg at 02/02/21 0845  . Chlorhexidine Gluconate Cloth 2 % PADS 6 each  6 each Topical Daily Modena Jansky, MD   6 each at 02/02/21 0849  . clonazePAM (KLONOPIN) disintegrating tablet 0.5 mg  0.5 mg Oral BID PRN Thurnell Lose, MD      . digoxin (LANOXIN) tablet 0.0625 mg  0.0625 mg Oral Daily Skeet Latch, MD   0.0625 mg at 02/02/21 0845  . diltiazem (CARDIZEM CD) 24 hr capsule 240 mg  240 mg Oral Daily Thurnell Lose, MD   240 mg at 02/02/21 1050  . feeding supplement (BOOST / RESOURCE BREEZE) liquid 1 Container  1 Container Oral BID BM Modena Jansky, MD   1 Container at 02/02/21 0850  . HYDROcodone-acetaminophen (NORCO/VICODIN) 5-325 MG per tablet 1 tablet  1 tablet Oral Q4H PRN Thurnell Lose, MD      . hydrOXYzine (ATARAX/VISTARIL) tablet 25 mg  25 mg Oral BID Modena Jansky, MD   25 mg at 02/02/21 0846  . lip balm (CARMEX) ointment   Topical PRN Modena Jansky, MD   Given at 01/26/21 1416  . loperamide (IMODIUM) capsule 4 mg  4 mg Oral TID PRN Modena Jansky, MD   4 mg at 02/01/21 2211  . MEDLINE mouth rinse  15 mL Mouth Rinse BID Modena Jansky, MD   15 mL at 02/02/21 0849  . mesalamine (PENTASA) CR capsule 250 mg  250 mg Oral BID Modena Jansky, MD   250 mg at 02/02/21 0844  . [START ON 02/03/2021] predniSONE (DELTASONE) tablet 10 mg  10 mg Oral Q breakfast Thurnell Lose, MD      . Rivaroxaban (XARELTO) tablet 15 mg  15 mg Oral QPC supper Modena Jansky, MD   15 mg at 02/01/21 1722  . saccharomyces boulardii (FLORASTOR) capsule 250 mg  250 mg Oral BID Modena Jansky, MD   250 mg at 02/02/21 0844  . sodium chloride flush (NS) 0.9 % injection 10-40 mL  10-40 mL Intracatheter PRN  Modena Jansky, MD   10 mL at 01/29/21 0625  . tamoxifen (NOLVADEX) tablet 20 mg  20 mg Oral Daily Modena Jansky, MD   20 mg at 02/02/21 0998     Discharge Medications: Please see discharge summary for a list of discharge medications.  Relevant Imaging Results:  Relevant Lab Results:   Additional Information ss# [place], [place]

## 2021-02-02 NOTE — Progress Notes (Signed)
D/C instructions printed and placed in packet at nurse's station. Tele and PIV removed. Pt tolerated poorly, verbally crying out with tape removal. Skin intact with little bleeding from site.

## 2021-02-02 NOTE — Progress Notes (Signed)
VAST consulted to remove PICC. Pt does not have PICC in place. Contacted unit RN who stated midline is in place and she will discontinue line as ordered by physician.

## 2021-02-03 NOTE — Progress Notes (Signed)
Pt discharged via [facility] to [facility].   All belongings with patient, IVs removed.  Final set of VS charted.

## 2021-02-09 DIAGNOSIS — I482 Chronic atrial fibrillation, unspecified: Secondary | ICD-10-CM | POA: Diagnosis not present

## 2021-02-09 DIAGNOSIS — L89613 Pressure ulcer of right heel, stage 3: Secondary | ICD-10-CM | POA: Diagnosis not present

## 2021-02-09 DIAGNOSIS — M109 Gout, unspecified: Secondary | ICD-10-CM | POA: Diagnosis not present

## 2021-02-09 DIAGNOSIS — R54 Age-related physical debility: Secondary | ICD-10-CM | POA: Diagnosis not present

## 2021-02-09 DIAGNOSIS — J9611 Chronic respiratory failure with hypoxia: Secondary | ICD-10-CM | POA: Diagnosis not present

## 2021-02-09 DIAGNOSIS — M810 Age-related osteoporosis without current pathological fracture: Secondary | ICD-10-CM | POA: Diagnosis not present

## 2021-02-09 DIAGNOSIS — I10 Essential (primary) hypertension: Secondary | ICD-10-CM | POA: Diagnosis not present

## 2021-02-09 DIAGNOSIS — E782 Mixed hyperlipidemia: Secondary | ICD-10-CM | POA: Diagnosis not present

## 2021-02-09 DIAGNOSIS — K5190 Ulcerative colitis, unspecified, without complications: Secondary | ICD-10-CM | POA: Diagnosis not present

## 2021-02-10 ENCOUNTER — Other Ambulatory Visit: Payer: Self-pay | Admitting: Cardiology

## 2021-02-10 ENCOUNTER — Other Ambulatory Visit: Payer: Self-pay | Admitting: *Deleted

## 2021-02-10 NOTE — Telephone Encounter (Signed)
Decline this dose.  Pt was given a prescription for 268m after recent Cardioversion. Should not be taking 1881mdose.

## 2021-02-11 DIAGNOSIS — I5032 Chronic diastolic (congestive) heart failure: Secondary | ICD-10-CM | POA: Diagnosis not present

## 2021-02-16 ENCOUNTER — Encounter: Payer: Medicare Other | Admitting: Physician Assistant

## 2021-02-16 NOTE — Progress Notes (Signed)
This encounter was created in error - please disregard.

## 2021-02-23 ENCOUNTER — Ambulatory Visit: Payer: Medicare Other | Admitting: Physician Assistant

## 2021-03-14 ENCOUNTER — Inpatient Hospital Stay (HOSPITAL_BASED_OUTPATIENT_CLINIC_OR_DEPARTMENT_OTHER)
Admission: EM | Admit: 2021-03-14 | Discharge: 2021-03-24 | DRG: 871 | Disposition: A | Discharge status: Hospice | Source: Skilled Nursing Facility | Attending: Family Medicine | Admitting: Family Medicine

## 2021-03-14 ENCOUNTER — Emergency Department (HOSPITAL_BASED_OUTPATIENT_CLINIC_OR_DEPARTMENT_OTHER)

## 2021-03-14 ENCOUNTER — Other Ambulatory Visit: Payer: Self-pay

## 2021-03-14 ENCOUNTER — Encounter (HOSPITAL_BASED_OUTPATIENT_CLINIC_OR_DEPARTMENT_OTHER): Payer: Self-pay | Admitting: Internal Medicine

## 2021-03-14 DIAGNOSIS — S81809A Unspecified open wound, unspecified lower leg, initial encounter: Secondary | ICD-10-CM

## 2021-03-14 DIAGNOSIS — L89610 Pressure ulcer of right heel, unstageable: Secondary | ICD-10-CM | POA: Diagnosis present

## 2021-03-14 DIAGNOSIS — R627 Adult failure to thrive: Secondary | ICD-10-CM | POA: Diagnosis present

## 2021-03-14 DIAGNOSIS — F419 Anxiety disorder, unspecified: Secondary | ICD-10-CM | POA: Diagnosis present

## 2021-03-14 DIAGNOSIS — R54 Age-related physical debility: Secondary | ICD-10-CM | POA: Diagnosis present

## 2021-03-14 DIAGNOSIS — D638 Anemia in other chronic diseases classified elsewhere: Secondary | ICD-10-CM | POA: Diagnosis present

## 2021-03-14 DIAGNOSIS — E271 Primary adrenocortical insufficiency: Secondary | ICD-10-CM | POA: Diagnosis present

## 2021-03-14 DIAGNOSIS — F32A Depression, unspecified: Secondary | ICD-10-CM | POA: Diagnosis present

## 2021-03-14 DIAGNOSIS — I10 Essential (primary) hypertension: Secondary | ICD-10-CM | POA: Diagnosis not present

## 2021-03-14 DIAGNOSIS — B9562 Methicillin resistant Staphylococcus aureus infection as the cause of diseases classified elsewhere: Secondary | ICD-10-CM

## 2021-03-14 DIAGNOSIS — Z952 Presence of prosthetic heart valve: Secondary | ICD-10-CM

## 2021-03-14 DIAGNOSIS — R6520 Severe sepsis without septic shock: Secondary | ICD-10-CM | POA: Diagnosis present

## 2021-03-14 DIAGNOSIS — I5032 Chronic diastolic (congestive) heart failure: Secondary | ICD-10-CM | POA: Diagnosis present

## 2021-03-14 DIAGNOSIS — I342 Nonrheumatic mitral (valve) stenosis: Secondary | ICD-10-CM | POA: Diagnosis not present

## 2021-03-14 DIAGNOSIS — I4892 Unspecified atrial flutter: Secondary | ICD-10-CM | POA: Diagnosis present

## 2021-03-14 DIAGNOSIS — R5381 Other malaise: Secondary | ICD-10-CM | POA: Diagnosis present

## 2021-03-14 DIAGNOSIS — I471 Supraventricular tachycardia: Secondary | ICD-10-CM | POA: Diagnosis not present

## 2021-03-14 DIAGNOSIS — E872 Acidosis: Secondary | ICD-10-CM | POA: Diagnosis present

## 2021-03-14 DIAGNOSIS — I340 Nonrheumatic mitral (valve) insufficiency: Secondary | ICD-10-CM | POA: Diagnosis not present

## 2021-03-14 DIAGNOSIS — Z79899 Other long term (current) drug therapy: Secondary | ICD-10-CM

## 2021-03-14 DIAGNOSIS — E273 Drug-induced adrenocortical insufficiency: Secondary | ICD-10-CM | POA: Diagnosis present

## 2021-03-14 DIAGNOSIS — I110 Hypertensive heart disease with heart failure: Secondary | ICD-10-CM | POA: Diagnosis present

## 2021-03-14 DIAGNOSIS — E876 Hypokalemia: Secondary | ICD-10-CM | POA: Diagnosis not present

## 2021-03-14 DIAGNOSIS — T380X5A Adverse effect of glucocorticoids and synthetic analogues, initial encounter: Secondary | ICD-10-CM | POA: Diagnosis present

## 2021-03-14 DIAGNOSIS — I484 Atypical atrial flutter: Secondary | ICD-10-CM | POA: Diagnosis present

## 2021-03-14 DIAGNOSIS — K5190 Ulcerative colitis, unspecified, without complications: Secondary | ICD-10-CM | POA: Diagnosis present

## 2021-03-14 DIAGNOSIS — Z7983 Long term (current) use of bisphosphonates: Secondary | ICD-10-CM

## 2021-03-14 DIAGNOSIS — K1120 Sialoadenitis, unspecified: Secondary | ICD-10-CM

## 2021-03-14 DIAGNOSIS — Z853 Personal history of malignant neoplasm of breast: Secondary | ICD-10-CM | POA: Diagnosis not present

## 2021-03-14 DIAGNOSIS — Z7189 Other specified counseling: Secondary | ICD-10-CM

## 2021-03-14 DIAGNOSIS — Z515 Encounter for palliative care: Secondary | ICD-10-CM | POA: Diagnosis not present

## 2021-03-14 DIAGNOSIS — Z8616 Personal history of COVID-19: Secondary | ICD-10-CM | POA: Diagnosis not present

## 2021-03-14 DIAGNOSIS — Z66 Do not resuscitate: Secondary | ICD-10-CM | POA: Diagnosis present

## 2021-03-14 DIAGNOSIS — Z7901 Long term (current) use of anticoagulants: Secondary | ICD-10-CM

## 2021-03-14 DIAGNOSIS — A419 Sepsis, unspecified organism: Secondary | ICD-10-CM | POA: Diagnosis present

## 2021-03-14 DIAGNOSIS — I2510 Atherosclerotic heart disease of native coronary artery without angina pectoris: Secondary | ICD-10-CM | POA: Diagnosis present

## 2021-03-14 DIAGNOSIS — A4102 Sepsis due to Methicillin resistant Staphylococcus aureus: Secondary | ICD-10-CM | POA: Diagnosis present

## 2021-03-14 DIAGNOSIS — E785 Hyperlipidemia, unspecified: Secondary | ICD-10-CM | POA: Diagnosis present

## 2021-03-14 DIAGNOSIS — R0602 Shortness of breath: Secondary | ICD-10-CM

## 2021-03-14 DIAGNOSIS — I361 Nonrheumatic tricuspid (valve) insufficiency: Secondary | ICD-10-CM | POA: Diagnosis not present

## 2021-03-14 DIAGNOSIS — L089 Local infection of the skin and subcutaneous tissue, unspecified: Secondary | ICD-10-CM

## 2021-03-14 DIAGNOSIS — Z803 Family history of malignant neoplasm of breast: Secondary | ICD-10-CM

## 2021-03-14 DIAGNOSIS — Z951 Presence of aortocoronary bypass graft: Secondary | ICD-10-CM

## 2021-03-14 DIAGNOSIS — Z91013 Allergy to seafood: Secondary | ICD-10-CM

## 2021-03-14 DIAGNOSIS — R55 Syncope and collapse: Secondary | ICD-10-CM | POA: Diagnosis not present

## 2021-03-14 DIAGNOSIS — I4821 Permanent atrial fibrillation: Secondary | ICD-10-CM | POA: Diagnosis present

## 2021-03-14 DIAGNOSIS — R7881 Bacteremia: Secondary | ICD-10-CM

## 2021-03-14 DIAGNOSIS — S81809S Unspecified open wound, unspecified lower leg, sequela: Secondary | ICD-10-CM | POA: Diagnosis not present

## 2021-03-14 DIAGNOSIS — R532 Functional quadriplegia: Secondary | ICD-10-CM | POA: Diagnosis present

## 2021-03-14 DIAGNOSIS — K1121 Acute sialoadenitis: Secondary | ICD-10-CM | POA: Diagnosis present

## 2021-03-14 DIAGNOSIS — D72829 Elevated white blood cell count, unspecified: Secondary | ICD-10-CM

## 2021-03-14 DIAGNOSIS — Z79810 Long term (current) use of selective estrogen receptor modulators (SERMs): Secondary | ICD-10-CM

## 2021-03-14 DIAGNOSIS — R609 Edema, unspecified: Secondary | ICD-10-CM | POA: Diagnosis not present

## 2021-03-14 DIAGNOSIS — E272 Addisonian crisis: Secondary | ICD-10-CM | POA: Diagnosis not present

## 2021-03-14 DIAGNOSIS — G8250 Quadriplegia, unspecified: Secondary | ICD-10-CM | POA: Diagnosis not present

## 2021-03-14 DIAGNOSIS — R0902 Hypoxemia: Secondary | ICD-10-CM | POA: Diagnosis not present

## 2021-03-14 DIAGNOSIS — Z923 Personal history of irradiation: Secondary | ICD-10-CM

## 2021-03-14 DIAGNOSIS — Z87891 Personal history of nicotine dependence: Secondary | ICD-10-CM

## 2021-03-14 LAB — RESPIRATORY PANEL BY PCR

## 2021-03-14 LAB — CBC WITH DIFFERENTIAL/PLATELET
Abs Immature Granulocytes: 0.35 10*3/uL — ABNORMAL HIGH (ref 0.00–0.07)
Basophils Absolute: 0.1 10*3/uL (ref 0.0–0.1)
Basophils Relative: 0 %
Eosinophils Absolute: 0 10*3/uL (ref 0.0–0.5)
Eosinophils Relative: 0 %
HCT: 38.5 % (ref 36.0–46.0)
Hemoglobin: 12.9 g/dL (ref 12.0–15.0)
Immature Granulocytes: 1 %
Lymphocytes Relative: 3 %
Lymphs Abs: 0.9 10*3/uL (ref 0.7–4.0)
MCH: 30.3 pg (ref 26.0–34.0)
MCHC: 33.5 g/dL (ref 30.0–36.0)
MCV: 90.4 fL (ref 80.0–100.0)
Monocytes Absolute: 1.4 10*3/uL — ABNORMAL HIGH (ref 0.1–1.0)
Monocytes Relative: 4 %
Neutro Abs: 30.7 10*3/uL — ABNORMAL HIGH (ref 1.7–7.7)
Neutrophils Relative %: 92 %
Platelets: 219 10*3/uL (ref 150–400)
RBC: 4.26 MIL/uL (ref 3.87–5.11)
RDW: 15.3 % (ref 11.5–15.5)
WBC: 33.4 10*3/uL — ABNORMAL HIGH (ref 4.0–10.5)
nRBC: 0 % (ref 0.0–0.2)

## 2021-03-14 LAB — COMPREHENSIVE METABOLIC PANEL
ALT: 11 U/L (ref 0–44)
AST: 16 U/L (ref 15–41)
Albumin: 3 g/dL — ABNORMAL LOW (ref 3.5–5.0)
Alkaline Phosphatase: 58 U/L (ref 38–126)
Anion gap: 20 — ABNORMAL HIGH (ref 5–15)
BUN: 16 mg/dL (ref 8–23)
CO2: 20 mmol/L — ABNORMAL LOW (ref 22–32)
Calcium: 8.7 mg/dL — ABNORMAL LOW (ref 8.9–10.3)
Chloride: 96 mmol/L — ABNORMAL LOW (ref 98–111)
Creatinine, Ser: 1.12 mg/dL — ABNORMAL HIGH (ref 0.44–1.00)
GFR, Estimated: 49 mL/min — ABNORMAL LOW (ref >60)
Glucose, Bld: 136 mg/dL — ABNORMAL HIGH (ref 70–99)
Potassium: 4.3 mmol/L (ref 3.5–5.1)
Sodium: 136 mmol/L (ref 135–145)
Total Bilirubin: 1.1 mg/dL (ref 0.3–1.2)
Total Protein: 6.3 g/dL — ABNORMAL LOW (ref 6.5–8.1)

## 2021-03-14 LAB — URINALYSIS, ROUTINE W REFLEX MICROSCOPIC
Bilirubin Urine: NEGATIVE
Glucose, UA: NEGATIVE mg/dL
Hgb urine dipstick: NEGATIVE
Ketones, ur: 15 mg/dL — AB
Leukocytes,Ua: NEGATIVE
Nitrite: NEGATIVE
Specific Gravity, Urine: >1.046 — ABNORMAL HIGH (ref 1.005–1.030)
pH: 5.5 (ref 5.0–8.0)

## 2021-03-14 LAB — RESP PANEL BY RT-PCR (FLU A&B, COVID) ARPGX2
Influenza A by PCR: NEGATIVE
Influenza B by PCR: NEGATIVE
SARS Coronavirus 2 by RT PCR: NEGATIVE

## 2021-03-14 LAB — PROTIME-INR
INR: 1.3 — ABNORMAL HIGH (ref 0.8–1.2)
Prothrombin Time: 15.7 seconds — ABNORMAL HIGH (ref 11.4–15.2)

## 2021-03-14 LAB — LACTIC ACID, PLASMA
Lactic Acid, Venous: 3.2 mmol/L (ref 0.5–1.9)
Lactic Acid, Venous: 1.2 mmol/L (ref 0.5–1.9)

## 2021-03-14 LAB — CBG MONITORING, ED: Glucose-Capillary: 157 mg/dL — ABNORMAL HIGH (ref 70–99)

## 2021-03-14 LAB — MRSA PCR SCREENING: MRSA by PCR: POSITIVE — AB

## 2021-03-14 LAB — GROUP A STREP BY PCR: Group A Strep by PCR: NOT DETECTED

## 2021-03-14 LAB — APTT: aPTT: 30 seconds (ref 24–36)

## 2021-03-14 MED ORDER — ONDANSETRON HCL 4 MG/2ML IJ SOLN: 4.0000 mg | Freq: Four times a day (QID) | INTRAMUSCULAR | Status: DC | PRN

## 2021-03-14 MED ORDER — HYDROXYZINE HCL 25 MG PO TABS
25.0000 mg | ORAL_TABLET | Freq: Two times a day (BID) | ORAL | Status: DC
  Administered 2021-03-14 – 2021-03-24 (×20): 25 mg via ORAL
  Filled 2021-03-14 (×20): qty 1

## 2021-03-14 MED ORDER — OXYCODONE HCL 5 MG PO TABS
5.0000 mg | ORAL_TABLET | ORAL | Status: DC | PRN
  Administered 2021-03-14 – 2021-03-18 (×3): 5 mg via ORAL
  Filled 2021-03-14 (×4): qty 1

## 2021-03-14 MED ORDER — SODIUM CHLORIDE 0.9 % IV SOLN: 2.0000 g | INTRAVENOUS | Status: DC

## 2021-03-14 MED ORDER — DILTIAZEM HCL-DEXTROSE 125-5 MG/125ML-% IV SOLN (PREMIX)
5.0000 mg/h | INTRAVENOUS | Status: DC
  Administered 2021-03-14: 5 mg/h via INTRAVENOUS
  Filled 2021-03-14: qty 125

## 2021-03-14 MED ORDER — VANCOMYCIN HCL 500 MG/100ML IV SOLN
500.0000 mg | INTRAVENOUS | Status: DC
  Filled 2021-03-14: qty 100

## 2021-03-14 MED ORDER — HYDROMORPHONE HCL 1 MG/ML IJ SOLN: 0.5000 mg | INTRAMUSCULAR | Status: DC | PRN

## 2021-03-14 MED ORDER — ACETAMINOPHEN 325 MG PO TABS
650.0000 mg | ORAL_TABLET | Freq: Four times a day (QID) | ORAL | Status: DC | PRN
  Administered 2021-03-17: 650 mg via ORAL
  Filled 2021-03-14: qty 2

## 2021-03-14 MED ORDER — MUPIROCIN 2 % EX OINT
1.0000 "application " | TOPICAL_OINTMENT | Freq: Two times a day (BID) | CUTANEOUS | Status: AC
  Administered 2021-03-14 – 2021-03-19 (×9): 1 via NASAL
  Filled 2021-03-14 (×3): qty 22

## 2021-03-14 MED ORDER — IOHEXOL 300 MG/ML  SOLN
100.0000 mL | Freq: Once | INTRAMUSCULAR | Status: AC | PRN
  Administered 2021-03-14: 80 mL via INTRAVENOUS

## 2021-03-14 MED ORDER — LACTATED RINGERS IV BOLUS (SEPSIS): 1000.0000 mL | Freq: Once | INTRAVENOUS | Status: DC

## 2021-03-14 MED ORDER — HYDROCORTISONE NA SUCCINATE PF 100 MG IJ SOLR
100.0000 mg | Freq: Once | INTRAMUSCULAR | Status: AC
  Administered 2021-03-14: 100 mg via INTRAVENOUS
  Filled 2021-03-14: qty 2

## 2021-03-14 MED ORDER — DILTIAZEM HCL-DEXTROSE 125-5 MG/125ML-% IV SOLN (PREMIX)
5.0000 mg/h | INTRAVENOUS | Status: DC
  Administered 2021-03-14 – 2021-03-15 (×2): 5 mg/h via INTRAVENOUS
  Administered 2021-03-15: 10 mg/h via INTRAVENOUS
  Filled 2021-03-14 (×2): qty 125

## 2021-03-14 MED ORDER — SODIUM CHLORIDE 0.9 % IV SOLN: 3.0000 g | Freq: Four times a day (QID) | INTRAVENOUS | Status: DC

## 2021-03-14 MED ORDER — DIGOXIN 0.0625 MG HALF TABLET
0.0625 mg | ORAL_TABLET | Freq: Every day | ORAL | Status: DC
  Administered 2021-03-14 – 2021-03-21 (×8): 0.0625 mg via ORAL
  Filled 2021-03-14 (×8): qty 1

## 2021-03-14 MED ORDER — ESCITALOPRAM OXALATE 10 MG PO TABS
10.0000 mg | ORAL_TABLET | Freq: Every day | ORAL | Status: DC
  Administered 2021-03-15 – 2021-03-23 (×9): 10 mg via ORAL
  Filled 2021-03-14 (×9): qty 1

## 2021-03-14 MED ORDER — SODIUM CHLORIDE 0.9 % IV BOLUS
1000.0000 mL | Freq: Once | INTRAVENOUS | Status: AC
  Administered 2021-03-14: 1000 mL via INTRAVENOUS

## 2021-03-14 MED ORDER — LACTATED RINGERS IV SOLN: INTRAVENOUS | Status: AC

## 2021-03-14 MED ORDER — RIVAROXABAN 15 MG PO TABS
15.0000 mg | ORAL_TABLET | Freq: Every day | ORAL | Status: DC
  Administered 2021-03-14 – 2021-03-23 (×10): 15 mg via ORAL
  Filled 2021-03-14 (×11): qty 1

## 2021-03-14 MED ORDER — VANCOMYCIN HCL IN DEXTROSE 1-5 GM/200ML-% IV SOLN
1000.0000 mg | Freq: Once | INTRAVENOUS | Status: AC
  Administered 2021-03-14: 1000 mg via INTRAVENOUS
  Filled 2021-03-14: qty 200

## 2021-03-14 MED ORDER — CHLORHEXIDINE GLUCONATE CLOTH 2 % EX PADS
6.0000 | MEDICATED_PAD | Freq: Every day | CUTANEOUS | Status: AC
  Administered 2021-03-15 – 2021-03-18 (×3): 6 via TOPICAL

## 2021-03-14 MED ORDER — ENSURE ENLIVE PO LIQD
237.0000 mL | Freq: Two times a day (BID) | ORAL | Status: DC
  Administered 2021-03-15 – 2021-03-20 (×11): 237 mL via ORAL

## 2021-03-14 MED ORDER — ACETAMINOPHEN 650 MG RE SUPP: 650.0000 mg | Freq: Four times a day (QID) | RECTAL | Status: DC | PRN

## 2021-03-14 MED ORDER — SODIUM CHLORIDE 0.9 % IV SOLN
3.0000 g | Freq: Three times a day (TID) | INTRAVENOUS | Status: DC
  Administered 2021-03-14 – 2021-03-15 (×3): 3 g via INTRAVENOUS
  Filled 2021-03-14 (×4): qty 8

## 2021-03-14 MED ORDER — ONDANSETRON HCL 4 MG PO TABS: 4.0000 mg | ORAL_TABLET | Freq: Four times a day (QID) | ORAL | Status: DC | PRN

## 2021-03-14 MED ORDER — SODIUM CHLORIDE 0.9% FLUSH
3.0000 mL | Freq: Two times a day (BID) | INTRAVENOUS | Status: DC
  Administered 2021-03-14 – 2021-03-24 (×14): 3 mL via INTRAVENOUS

## 2021-03-14 MED ORDER — SODIUM CHLORIDE 0.9 % IV SOLN
2.0000 g | Freq: Once | INTRAVENOUS | Status: AC
  Administered 2021-03-14: 2 g via INTRAVENOUS
  Filled 2021-03-14: qty 2

## 2021-03-14 MED ORDER — ACETAMINOPHEN 325 MG PO TABS
650.0000 mg | ORAL_TABLET | Freq: Once | ORAL | Status: AC
  Administered 2021-03-14: 650 mg via ORAL
  Filled 2021-03-14: qty 2

## 2021-03-14 MED ORDER — METRONIDAZOLE IN NACL 5-0.79 MG/ML-% IV SOLN
500.0000 mg | Freq: Once | INTRAVENOUS | Status: AC
  Administered 2021-03-14: 500 mg via INTRAVENOUS
  Filled 2021-03-14: qty 100

## 2021-03-14 MED ORDER — HYDROCORTISONE NA SUCCINATE PF 100 MG IJ SOLR
50.0000 mg | Freq: Four times a day (QID) | INTRAMUSCULAR | Status: DC
  Administered 2021-03-14 – 2021-03-18 (×17): 50 mg via INTRAVENOUS
  Filled 2021-03-14 (×17): qty 2

## 2021-03-14 NOTE — ED Notes (Signed)
Cardizem gtt increased to 10 ml per hour.

## 2021-03-14 NOTE — ED Notes (Signed)
CRITICAL VALUE STICKER  CRITICAL VALUE:Lactic acid 3.2  RECEIVER (on-site recipient of call):Shawnie Pons, RN  DATE & TIME NOTIFIED: 03/14/2021 0741  MESSENGER (representative from lab):  MD NOTIFIED: Dr. Ronnald Nian  TIME OF NOTIFICATION:0741  RESPONSE:

## 2021-03-14 NOTE — ED Notes (Signed)
Pt to ED from facility on hospice with c/o neck pain today.

## 2021-03-14 NOTE — Progress Notes (Signed)
[facility] to [facility] transfer:  Patient with h/o UC; afib s/p Maze; CAD s/p CABG; chornic diastolic dysfunction; Addison's disease; breast cancer; COVID in 11/2020; HTN; and HLD who presented to DB with neck pain.  She is on hospice at home due to end stage hypertensive heart disease.  She was found to have sepsis physiology and further evaluation indicates acute parotitis as the source, no abscess.  WBC 33, lactate 3.2.  She was empirically started on Cipro on 4/18 for UTI - but UA not apparently checked.  She was cardioverted less than a month ago, MOST form completed with limited interventions.  Patient/family do still want treatment.  Flutter in 120s right now, on IVF and antibiotics.  BP is ok.  Given Solucortef.  Will accept for admission to [facility].   Carlyon Shadow, MD

## 2021-03-14 NOTE — Progress Notes (Signed)
Elink monitoring for sepsis protocol 

## 2021-03-14 NOTE — ED Provider Notes (Addendum)
Patient signed out to me at 7 AM.  Patient with history of a flutter on blood thinners, hypertension, high cholesterol, ulcerative colitis, currently on hospice due to functional quadriplegia, history of Addison's disease.  Here with fever, tachycardia.  Sepsis work-up has been initiated.  Has been on antibiotics for urinary tract infection.  Has had some right-sided facial swelling for the last day or 2 that has gotten worse this morning.  CT scan has been ordered of the neck for further evaluation as this could be dental/parotid in origin.  Viral work-up also initiated.  Broad-spectrum IV antibiotics have been started.  Patient getting fluid bolus.  Thus far blood work shows a white count of 33.  Patient is DNR.  Family has been updated.  Patient with normal mental status.  Pleasant.  White count is 33, lactic acid 3.2.  COVID and flu test negative.  Creatinine slightly elevated.  Strep test is negative.  Chest x-ray without signs of infection.  CT scan of the face shows acute parotiditis on the right.  There is no obvious abscess or obstructing process.  There is secondary inflammation and involves the right side of the necks and pharynx as well.  However airways pain.  Patient given broad-spectrum IV antibiotics and has already been given stress dose steroids.  Likely source of infection.  Urinalysis is also still pending.  Heart rate has improved to the 120s.  Will admit to medicine for further care.  After fluid resuscitation patient still with atrial flutter with RVR.  We will start diltiazem infusion.  This chart was dictated using voice recognition software.  Despite best efforts to proofread,  errors can occur which can change the documentation meaning.   .Critical Care Performed by: Lennice Sites, DO Authorized by: Lennice Sites, DO   Critical care provider statement:    Critical care time (minutes):  35   Critical care was necessary to treat or prevent imminent or life-threatening  deterioration of the following conditions:  Sepsis   Critical care was time spent personally by me on the following activities:  Blood draw for specimens, development of treatment plan with patient or surrogate, discussions with primary provider, evaluation of patient's response to treatment, examination of patient, obtaining history from patient or surrogate, ordering and performing treatments and interventions, ordering and review of laboratory studies, ordering and review of radiographic studies, pulse oximetry, re-evaluation of patient's condition and review of old charts   I assumed direction of critical care for this patient from another provider in my specialty: no         Lennice Sites, DO 03/14/21 [place], [place], DO 03/14/21 1221

## 2021-03-14 NOTE — Progress Notes (Signed)
Pharmacy Antibiotic Note  Michele Green is a 80 y.o. female admitted on 03/14/2021 with possible sepsis.  Pharmacy has been consulted for Vancomycin and Cefepime  dosing.  Plan: Vancomycin 500 mg IV q48h Cefepime 2 g IV q24h     Temp (24hrs), Avg:100.7 F (38.2 C), Min:100.7 F (38.2 C), Max:100.7 F (38.2 C)  Recent Labs  Lab 03/14/21 0631  WBC 33.4*  CREATININE 1.12*  LATICACIDVEN 3.2*    CrCl cannot be calculated (Unknown ideal weight.).    Allergies  Allergen Reactions  . Shellfish Allergy Itching  . Amoxicillin-Pot Clavulanate Diarrhea     Caryl Pina 03/14/2021 7:55 AM

## 2021-03-14 NOTE — ED Notes (Signed)
Pure wick is in place after pericare perfomed. Await urination for ua and culture.

## 2021-03-14 NOTE — ED Notes (Signed)
Pt received from EMS. Pt is alert and oriented x4. Pt reports right swollen face/ear tender for few days, pt also reports pain in neck and is limited with ROM. Pt has multiple skin tears to her left lower leg ,they are covered with dressings from facility. Pt also has a dry dressing on her right ankle. Pt 's skin is bruised, fragile all over.

## 2021-03-14 NOTE — H&P (Addendum)
History and Physical    Michele Green KPV:[id number] DOB: 06/06/1941 DOA: 03/14/2021  PCP: [name], MD Consultants:  Hospice; [employer]; [facility] - oncology; Tamala Julian - cardiology; Therisa Doyne - GI Patient coming from: [facility]; NOK: Fuller Song Darby, [phone]  Chief Complaint: neck pain  HPI: Michele Green is a 80 y.o. female with medical history significant of UC; afib s/p Maze; CAD s/p CABG; chronic diastolic dysfunction; Addison's disease; breast cancer; COVID in 11/2020; HTN; and HLD who presented to [facility] with neck pain. She was hospitalized in Jan 2022 for COVID PNA and afib with RVR.  She was then hospitalized in late February for afib with RVR and steroid-induced adrenal insufficiency.  She was admitted again on 3/5 for afib with RBR and transferred to [facility] with DCCV cardioversrion on 3/10.  She converted to NSR but reverted back to aflutter.  She obtained rate control with IV Digoxin and was discharged back to SNF on 3/14.  She developed R lateral face and neck pain and presented to [facility] where she was found to have SIRS criteria; code sepsis was activated.  At the time of my evaluation, she confirmed DNR; reports extreme fatigue; and voiced an interest in some minimal treatments to try to help her to improve but with a clear primary goal of comfort.    ED Course:  [facility] to [facility] transfer:  She is on hospice at home due to end stage hypertensive heart disease.  She was found to have sepsis physiology and further evaluation indicates acute parotitis as the source, no abscess.  WBC 33, lactate 3.2.  She was empirically started on Cipro on 4/18 for UTI - but UA not apparently checked.  She was cardioverted less than a month ago, MOST form completed with limited interventions.  Patient/family do still want treatment.  Flutter in 120s right now, on IVF and antibiotics.  BP is ok.  Given Solucortef.   Review of Systems: As per HPI; otherwise review of systems reviewed and negative.  Limited by fatigue.  COVID Vaccine Status:  Complete  Past Medical History:  Diagnosis Date  . Arrhythmia   . Atypical atrial flutter ([place]) 08/14/2018   Post-operative  . Breast cancer of upper-outer quadrant of left female breast ([place]) 02/02/2016  . CAD (coronary artery disease) 06/20/2018   LHC 7/19: pLAD 65/90, oD1 90, mLCx 85, OM2 50, oRCA 30, EF 50-55 >> s/p CABG in 9/19 (L-LAD)  . Colon polyp 02/2012  . Dupuytren contracture    bilateral hands  . Family history of breast cancer   . Hyperlipidemia   . Hypertension   . Mitral regurgitation 11/07/2013   Echo 7/19: Mild LVH, EF 60-65, no RWMA, mod ot severe MR, massive LAE, PASP 33 // TEE 7/19:  Mild conc LVH, EF 60-65, no RWMA, severe MR with mild post leaflet prolapse, massive // s/p MV repair 07/2018  . On continuous oral anticoagulation 08/22/2015   Started on Xarelto 08/12/2015   . Osteopenia   . Persistent atrial fibrillation ([place]) 08/22/2015   Started late August or early September 2016 // s/p Maze procedure 07/2018  . Personal history of radiation therapy   . Radiation Therapy 04/21/16-05/19/16   left breast 47.72 Gy, boosted to 10 Gy  . 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 S/P Maze operation for atrial fibrillation 08/10/2018   Complete bilateral atrial lesion set using bipolar radiofrequency and cryothermy ablation with clipping of LA appendage  . S/P  MVR (mitral valve repair) 08/10/2018   Complex valvuloplasty including Gore-tex neochord placement x8, Plication of Lateral Commissure and 56m Sorin Memo 4D Ring Annuloplasty SN# [id number] . Ulcerative colitis ([place]   . Wears glasses     Past Surgical History:  Procedure Laterality Date  . BIOPSY  07/14/2020   Procedure: BIOPSY;  Surgeon: KRonnette Juniper MD;  [facility]: [facility];  Service: Gastroenterology;;  . BREAST BIOPSY Left 01/28/2016  . BREAST LUMPECTOMY Left 02/23/2016  . BREAST LUMPECTOMY WITH RADIOACTIVE SEED LOCALIZATION Left 02/23/2016   Procedure: BREAST  LUMPECTOMY WITH RADIOACTIVE SEED LOCALIZATION;  Surgeon: BExcell Seltzer MD;  [place]  Service: General;  Laterality: Left;  . CARDIAC CATHETERIZATION    . CARDIOVERSION N/A 10/02/2015   Procedure: CARDIOVERSION;  Surgeon: MJerline Pain MD;  [facility];  Service: Cardiovascular;  Laterality: N/A;  . CARDIOVERSION N/A 10/05/2018   Procedure: CARDIOVERSION;  Surgeon: RFay Records MD;  [facility];  Service: Cardiovascular;  Laterality: N/A;  . CARDIOVERSION N/A 11/05/2019   Procedure: CARDIOVERSION;  Surgeon: NDorothy Spark MD;  [facility];  Service: Cardiovascular;  Laterality: N/A;  . CARDIOVERSION N/A 01/29/2021   Procedure: CARDIOVERSION;  Surgeon: SDonato Heinz MD;  [place]  Service: Cardiovascular;  Laterality: N/A;  . CLIPPING OF ATRIAL APPENDAGE  08/10/2018   Procedure: CLIPPING OF ATRIAL APPENDAGE;  Surgeon: ORexene Alberts MD;  [place]  Service: Open Heart Surgery;;  . COLONOSCOPY    . COLONOSCOPY WITH PROPOFOL N/A 07/14/2020   Procedure: COLONOSCOPY WITH PROPOFOL;  Surgeon: KRonnette Juniper MD;  [facility];  Service: Gastroenterology;  Laterality: N/A;  . CORONARY ARTERY BYPASS GRAFT N/A 08/10/2018   Procedure: CORONARY ARTERY BYPASS GRAFTING (CABG) x 1, LIMA-LAD,  USING LEFT INTERNAL MAMMARY ARTERY. HARVESTED RIGHT GREATER SAPHENOUS VEIN ENDOSCOPICALLY;  Surgeon: ORexene Alberts MD;  [place]  Service: Open Heart Surgery;  Laterality: N/A;  . CYSTOSCOPY W/ RETROGRADES Bilateral 06/20/2019   Procedure: CYSTOSCOPY WITH RETROGRADE PYELOGRAM;  Surgeon: HArdis Hughs MD;  [facility]  Service: Urology;  Laterality: Bilateral;  . CYSTOSCOPY WITH HYDRODISTENSION AND BIOPSY N/A 06/20/2019   Procedure: CYSTOSCOPY BLADDER  BIOPSY WITH FULGERATION;  Surgeon: HArdis Hughs MD;  [facility]  Service: Urology;   Laterality: N/A;  . DUPUYTREN CONTRACTURE RELEASE  2001   leftx2  . [place]  right  . DUPUYTREN CONTRACTURE RELEASE Right 05/02/2014   Procedure: EXCISION DUPUYTRENS RIGHT PALMAR/SMALL ;  Surgeon: RCammie Sickle MD;  [facility]  Service: Orthopedics;  Laterality: Right;  . HEMOSTASIS CLIP PLACEMENT  07/14/2020   Procedure: HEMOSTASIS CLIP PLACEMENT;  Surgeon: KRonnette Juniper MD;  [facility];  Service: Gastroenterology;;  . IR VERTEBROPLASTY CERV/THOR BX INC UNI/BIL INC/INJECT/IMAGING  08/19/2020  . IR VERTEBROPLASTY CERV/THOR BX INC UNI/BIL INC/INJECT/IMAGING  09/15/2020  . MAZE N/A 08/10/2018   Procedure: MAZE;  Surgeon: ORexene Alberts MD;  [place]  Service: Open Heart Surgery;  Laterality: N/A;  . MITRAL VALVE REPAIR N/A 08/10/2018   Procedure: MITRAL VALVE REPAIR (MVR);  Surgeon: ORexene Alberts MD;  [place]  Service: Open Heart Surgery;  Laterality: N/A;  glutaraldehyde  . POLYPECTOMY  07/14/2020   Procedure: POLYPECTOMY;  Surgeon: KRonnette Juniper MD;  [facility];  Service: Gastroenterology;;  . RIGHT/LEFT HEART CATH AND CORONARY ANGIOGRAPHY N/A 06/20/2018  Procedure: RIGHT/LEFT HEART CATH AND CORONARY ANGIOGRAPHY;  Surgeon: [name], MD;  Location: [place] [facility];  Service: Cardiovascular;  Laterality: N/A;  . TEE WITHOUT CARDIOVERSION N/A 06/20/2018   Procedure: TRANSESOPHAGEAL ECHOCARDIOGRAM (TEE);  Surgeon: Pixie Casino, MD;  [facility];  Service: Cardiovascular;  Laterality: N/A;  . TEE WITHOUT CARDIOVERSION N/A 08/10/2018   Procedure: TRANSESOPHAGEAL ECHOCARDIOGRAM (TEE);  Surgeon: Rexene Alberts, MD;  Location: [place];  Service: Open Heart Surgery;  Laterality: N/A;  . TONSILLECTOMY  age 41    Social History   Socioeconomic History  . Marital status: Widowed    Spouse name: Not on file  . Number of children: 1  . Years of education: Not on file  . Highest  education level: Not on file  Occupational History  . Not on file  Tobacco Use  . Smoking status: Former Smoker    Packs/day: 1.00    Years: 30.00    Pack years: 30.00    Types: Cigarettes    Quit date: 11/22/1992    Years since quitting: 28.3  . Smokeless tobacco: Never Used  Vaping Use  . Vaping Use: Never used  Substance and Sexual Activity  . Alcohol use: Yes    Alcohol/week: 3.0 standard drinks    Types: 3 Standard drinks or equivalent per week    Comment: social  . Drug use: No  . Sexual activity: Not Currently    Partners: Male    Birth control/protection: Post-menopausal  Other Topics Concern  . Not on file  Social History Narrative  . Not on file   Social Determinants of Health   Financial Resource Strain: Not on file  Food Insecurity: Not on file  Transportation Needs: Not on file  Physical Activity: Not on file  Stress: Not on file  Social Connections: Not on file  Intimate Partner Violence: Not on file    Allergies  Allergen Reactions  . Shellfish Allergy Itching  . Amoxicillin-Pot Clavulanate Diarrhea    Family History  Problem Relation Age of Onset  . Cirrhosis Mother   . Breast cancer Mother 43  . Heart failure Father   . Heart attack Father   . Breast cancer Sister        early 36's  . Kidney Stones Sister        loss of kidney due to stones  . Osteoporosis Neg Hx     Prior to Admission medications   Medication Sig Start Date End Date Taking? Authorizing Provider  digoxin 62.5 MCG TABS Take 0.0625 mg by mouth daily. 02/03/21  Yes Thurnell Lose, MD  diltiazem (CARDIZEM CD) 240 MG 24 hr capsule Take 1 capsule (240 mg total) by mouth daily. 02/02/21  Yes Thurnell Lose, MD  escitalopram (LEXAPRO) 10 MG tablet Take 10 mg by mouth daily.   Yes [provider]  furosemide (LASIX) 40 MG tablet Take 1 tablet (40 mg total) by mouth daily. 02/02/21  Yes Thurnell Lose, MD  hydrOXYzine (ATARAX/VISTARIL) 25 MG tablet Take 25 mg by  mouth in the morning and at bedtime. 01/14/21  Yes [provider]  oxyCODONE (OXY IR/ROXICODONE) 5 MG immediate release tablet Take 1 tablet (5 mg total) by mouth every 6 (six) hours as needed for severe pain. 02/02/21  Yes Thurnell Lose, MD  potassium chloride 20 MEQ TBCR Take 20 mEq by mouth daily. 02/02/21  Yes Thurnell Lose, MD  XARELTO 15 MG TABS tablet TAKE 1 TABLET  BY MOUTH  DAILY WITH SUPPER Patient taking differently: Take 15 mg by mouth at bedtime. 10/06/20  Yes [name], MD  acetaminophen (TYLENOL) 500 MG tablet Take 500-1,000 mg by mouth every 6 (six) hours as needed for moderate pain or headache.     [provider]  alendronate (FOSAMAX) 70 MG tablet Take 70 mg by mouth once a week. 09/24/20   [provider]  allopurinol (ZYLOPRIM) 100 MG tablet Take 200 mg by mouth every evening.  06/21/13   [provider]  atorvastatin (LIPITOR) 20 MG tablet TAKE 1 TABLET BY MOUTH EVERY DAY Patient taking differently: Take 20 mg by mouth daily. 04/07/20   Camnitz, Ocie Doyne, MD  bisacodyl (DULCOLAX) 10 MG suppository Place 1 suppository (10 mg total) rectally daily as needed for moderate constipation. 09/19/20   Oswald Hillock, MD  clonazePAM (KLONOPIN) 0.5 MG disintegrating tablet Take 1 tablet (0.5 mg total) by mouth 2 (two) times daily as needed for seizure. 02/02/21   Thurnell Lose, MD  mesalamine (APRISO) 0.375 g 24 hr capsule Take 0.375 g by mouth daily. 06/01/19   [provider]  Multiple Vitamin (MULTIVITAMIN WITH MINERALS) TABS tablet Take 1 tablet by mouth daily. 08/24/20   Debbe Odea, MD  predniSONE (DELTASONE) 5 MG tablet Take 1 tablet daily for 5 days, then half tablet daily for 5 days, then stop. 02/02/21   Thurnell Lose, MD  tamoxifen (NOLVADEX) 20 MG tablet Take 20 mg by mouth daily.    [provider]    Physical Exam: Vitals:   03/14/21 1430 03/14/21 1600 03/14/21 1631 03/14/21 1800  BP: 117/68 104/64  113/68 99/67  Pulse: (!) 127 (!) 133    Resp: (!) 21 20  18   Temp:  98 F (36.7 C)    TempSrc:  Oral    SpO2: 99% 98%  99%  Weight:         . General:  Appears chronically ill, debilitated, frail . Eyes:  EOMI, normal lids, iris . ENT:  grossly normal hearing, lips & tongue; mild difficulty opening her mouth and R lateral face edema and TTP in parotid region with scant erythema     . Neck:  Submandibular edema on R . Cardiovascular:  RR with persistent tachycardia. No LE edema.  [name] Respiratory:   CTA bilaterally with no wheezes/rales/rhonchi.  Normal respiratory effort. . Abdomen:  soft, NT, ND . Skin:  Scattered petechiae; skin tears along anterior left leg    Pressure injury to R lateral plantar foot and ulcer of R medial plantar foot at heel      . Musculoskeletal:  Atrophic LE > UE . Psychiatric:  Flat mood and affect, speech fluent and appropriate, AOx3    Radiological Exams on Admission: Independently reviewed - see discussion in A/P where applicable  CT Soft Tissue Neck W Contrast  Result Date: 03/14/2021 CLINICAL DATA:  Neck abscess EXAM: CT NECK WITH CONTRAST TECHNIQUE: Multidetector CT imaging of the neck was performed using the standard protocol following the bolus administration of intravenous contrast. CONTRAST:  69m OMNIPAQUE IOHEXOL 300 MG/ML  SOLN COMPARISON:  None. FINDINGS: Pharynx and larynx: Submucosal edema the level of the pharynx and possibly supraglottic larynx which is considered secondary. Salivary glands: Enlargement and hyperenhancement of the right parotid gland. No visible stone or abscess. Thyroid: Negative Lymph nodes: None enlarged or abnormal density. Vascular: Heavily calcified carotid bifurcations. No acute vascular finding Limited intracranial: Negative Visualized orbits: Degraded by motion artifact.  Bilateral cataract resection. Mastoids and visualized paranasal sinuses: Chronic left sphenoid sinusitis with complete opacification. Milder  right sphenoid opacification. Skeleton: C5-6 and C6-7 advanced disc degeneration. Upper chest: No acute finding IMPRESSION: Acute parotiditis on the right. No abscess or obstructing process. Secondary inflammation involves the right neck and pharynx. Electronically Signed   By: Monte Fantasia M.D.   On: 03/14/2021 08:36   DG Chest Port 1 View  Result Date: 03/14/2021 CLINICAL DATA:  Right facial swelling and tenderness. Possible sepsis. EXAM: PORTABLE CHEST 1 VIEW COMPARISON:  01/24/2021 FINDINGS: Stable mild cardiomegaly. Aortic atherosclerotic calcification noted. Previous median sternotomy and mitral valve replacement. No evidence of pulmonary infiltrate or edema. No evidence of pleural effusion. IMPRESSION: Mild cardiomegaly.  No active lung disease. Electronically Signed   By: Marlaine Hind M.D.   On: 03/14/2021 08:31    EKG: Independently reviewed.  Sinus tachycardia with rate 135; nonspecific ST changes possibly related to tachycardia   Labs on Admission: I have personally reviewed the available labs and imaging studies at the time of the admission.  Pertinent labs:   Glucose 136 BUN 16/Creatinine 1.12/GFR 49 Anion gap 20 Albumin 3.0 Lactate 3.2 -> 1.2 WBC 33.4 INR 1.3 COVID/flu negative UA: 15 ketones, trace protein   Assessment/Plan Principal Problem:   Acute parotitis Active Problems:   Essential hypertension   Atrial flutter with rapid ventricular response (HCC)   Goals of care, counseling/discussion   Adrenal insufficiency (Addison's disease) (HCC)   Functional quadriplegia (HCC)   Sepsis ([place])   Sepsis due to acute parotitis -SIRS criteria in this patient includes: Leukocytosis, fever, tachycardia, tachypnea, hypoxia  -Patient has evidence of acute organ failure with elevated lactate >2 that is not easily explained by another condition. -While awaiting blood cultures, this appears to be a preseptic condition. -Sepsis protocol initiated -Suspected source is acute  parotitis based on location of pain and + CT -Blood and urine cultures pending -Will admit to progressive care for now -Treat with IV Unasyn; she did receive Vanc and Cefepime at [facility] -Lactate has normalized -Forceful massage of parotid could be considered by this does not appear to be in accordance with patient's stated goals of care -Oxy, Dilaudid as needed for pain  Functional quadriplegia -This patient has a baseline chronic medical condition that requires total care by caregivers (but without underlying spinal cord injury) -The patient has demonstrated inability to feed/groom herself, is nonambulatory -Her functional quadriplegia places her at higher risks for complications from acute illness and requires greater resources for her care  Afib/flutter -s/p Maze and recent DCCV -She is in atrial tachycardia vs. Flutter now with HR about 130s -Will continue low-dose Dilt and IVF for now -Will give home dose of PO Digoxin (has not received today) -If unsuccessful, options include cards consult; continuing to monitor (low intervention); giving IV Digoxin (worked last hospitalization) -Continue Xarelto for now, but consider d/c if this is making her wounds bleed more easily  Leg Wounds -Wound care consult -As noted above, general goal is for comfort although treatment is acceptable if minimally invasive and likely to help  CAD -s/p CABG -No report of current CP  Chronic diastolic dysfunction -Hold Lasix, rehydrating -Monitor for evidence of volume overload  Steroid-induced adrenal insufficiency -Will give stress dosed hydrocortisone 50 mg q6h for now  H/o breast cancer -No longer on tamoxifen  HTN -Hold PO Dilt while on Dilt drip  HLD -she does not appear to be taking medications for this issue at this time  Goals of care/DNR -I have discussed code status with the patient and she would not desire resuscitation and would prefer to die a natural death should that situation  arise. -She is enrolled in outpatient hospice -She is ok with a limited trial of treatment to see if this will improve her condition, but her primary goal at this time is for comfort -Will treat, but not at the sacrifice of her comfort at this time -Palliative care assistance requested -Continue Lexapro, Hydroxyzine -Oxy, Dilaudid as needed for pain -Fosamax can likely be discontinued     Note: This patient has been tested and is negative for the novel coronavirus COVID-19. She has been fully vaccinated against COVID-19.    DVT prophylaxis:  Xarelto Code Status:  DNR - confirmed with patient Family Communication: None present  Disposition Plan:  The patient is from: ALF  Anticipated d/c is to: to be determined  Anticipated d/c date will depend on clinical response to treatment, likely 2-4 days  Patient is currently: acutely ill Consults called: Palliative care, hospice; Wound care Admission status: Admit - It is my clinical opinion that admission to INPATIENT is reasonable and necessary because of the expectation that this patient will require hospital care that crosses at least 2 midnights to treat this condition based on the medical complexity of the problems presented.  Given the aforementioned information, the predictability of an adverse outcome is felt to be significant.     Karmen Bongo MD [facility]   How to contact the [facility] Attending or Consulting provider [place] or covering provider during after hours [place], for this patient?  1. Check the care team in [facility] and look for a) attending/consulting TR[facility] provider listed and b) the [facility] team listed 2. Log into [email or web address] and use [place]'s universal password to access. If you do not have the password, please contact the hospital operator. 3. Locate the [facility] provider you are looking for under [facility] and page to a number that you can be directly reached. 4. If you still have difficulty reaching the  provider, please page the [facility] (Director on Call) for the Hospitalists listed on amion for assistance.   03/14/2021, 6:08 PM

## 2021-03-14 NOTE — ED Notes (Signed)
Daughter updated via md

## 2021-03-14 NOTE — ED Notes (Signed)
cbg [id number]

## 2021-03-14 NOTE — Progress Notes (Signed)
Manufacturing engineer [facility])      This patient is enrolled in hospice services with a terminal diagnosis of hypertensive heart disease. She was started on Cipro x 7 days on 03/09/21 and has recently had her comfort meds increased to oxycodone 10 mg qh4 PRN pain and haldol 0.5 mg q6h PRN agitation.  Pt has pain r/t unstageable wounds to R heel.   [facility] will continue to follow for any discharge planning needs and to coordinate continuation of hospice care.  Thank you for the opportunity to participate in this patient's care.     [name], BSN, RN [facility] Liaison  [phone]  [phone] (24h on call)

## 2021-03-14 NOTE — ED Provider Notes (Signed)
[facility] EMERGENCY DEPT Provider Note   CSN: [id number] Arrival date & time: 03/14/21  7829    History Chief Complaint  Patient presents with  . Neck Pain    Michele Green is a 80 y.o. female.  The history is provided by the patient.  Neck Pain She has history of hypertension, hyperlipidemia, persistent atrial fibrillation anticoagulated on rivaroxaban, breast cancer, diastolic heart failure, ulcerative colitis and comes in complaining of a sore throat.  She states that her throat started hurting because she did not get her afternoon medications yesterday.  She was not aware of any fever or chills.  She is not able to put a number on her pain.  Of note, she is under hospice care.  Past Medical History:  Diagnosis Date  . Arrhythmia   . Atypical atrial flutter ([place]) 08/14/2018   Post-operative  . Breast cancer of upper-outer quadrant of left female breast ([place]) 02/02/2016  . CAD (coronary artery disease) 06/20/2018   LHC 7/19: pLAD 65/90, oD1 90, mLCx 85, OM2 50, oRCA 30, EF 50-55 >> s/p CABG in 9/19 (L-LAD)  . Colon polyp 02/2012  . Dupuytren contracture    bilateral hands  . Family history of breast cancer   . Hyperlipidemia   . Hypertension   . Mitral regurgitation 11/07/2013   Echo 7/19: Mild LVH, EF 60-65, no RWMA, mod ot severe MR, massive LAE, PASP 33 // TEE 7/19:  Mild conc LVH, EF 60-65, no RWMA, severe MR with mild post leaflet prolapse, massive // s/p MV repair 07/2018  . On continuous oral anticoagulation 08/22/2015   Started on Xarelto 08/12/2015   . Osteopenia   . Persistent atrial fibrillation ([place]) 08/22/2015   Started late August or early September 2016 // s/p Maze procedure 07/2018  . Personal history of radiation therapy   . Radiation Therapy 04/21/16-05/19/16   left breast 47.72 Gy, boosted to 10 Gy  . 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 S/P Maze operation for atrial fibrillation 08/10/2018   Complete bilateral atrial lesion set using  bipolar radiofrequency and cryothermy ablation with clipping of LA appendage  . S/P MVR (mitral valve repair) 08/10/2018   Complex valvuloplasty including Gore-tex neochord placement x8, Plication of Lateral Commissure and 69m Sorin Memo 4D Ring Annuloplasty SN# [id number] . Ulcerative colitis ([place]   . Wears glasses     Patient Active Problem List   Diagnosis Date Noted  . Atrial tachycardia (HM[facility]el   . Adrenal insufficiency (Addison's disease) (HG[place] 01/24/2021  . Anemia of chronic disease 01/24/2021  . Functional quadriplegia ([place] 01/24/2021  . Hypotension 01/17/2021  . Elevated troponin 01/17/2021  . Debility 01/17/2021  . Chronic pain 01/17/2021  . Ulcerative colitis ([place] 01/17/2021  . Chronic respiratory failure with hypoxia ([place] 12/03/2020  . Pressure injury of skin 12/02/2020  . Pneumonia due to COVID-19 virus 12/01/2020  . T8 vertebral fracture (HEwa Gentry 09/13/2020  . Compression fracture of C-spine (HGustine 09/12/2020  . Compression fracture of thoracic spine, non-traumatic (HA[place]land City 09/12/2020  . Goals of care, counseling/discussion   . Palliative care by specialist   . Spinal fracture of T7 vertebra (HW[place] 08/17/2020  . Atrial fibrillation with RVR (HSierra Blanca 08/16/2020  . Long term (current) use of anticoagulants 08/28/2018  . Atrial flutter with rapid ventricular response (HTarentum 08/14/2018  . 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 S/P Maze operation for atrial fibrillation 08/10/2018  . Chronic diastolic HF (  heart failure) ([place]) 06/20/2018  . Osteoporosis 09/29/2017  . Loosening of hardware in spine ([place]) 08/23/2017  . Spinal stenosis of lumbar region 08/11/2016  . Unspecified cord compression ([place]) 08/11/2016  . Unstable burst fracture of third lumbar vertebra ([place]) 08/11/2016  . Genetic testing 03/17/2016  . Family history of breast cancer   . Breast cancer of upper-outer quadrant of left female breast ([place]) 02/02/2016  . On amiodarone therapy 09/09/2015   . On continuous oral anticoagulation 08/22/2015  . Persistent atrial fibrillation ([place]) 08/22/2015  . Essential hypertension 11/07/2013  . Mitral regurgitation 11/07/2013    Past Surgical History:  Procedure Laterality Date  . BIOPSY  07/14/2020   Procedure: BIOPSY;  Surgeon: Ronnette Juniper, MD;  [facility]: [facility];  Service: Gastroenterology;;  . BREAST BIOPSY Left 01/28/2016  . BREAST LUMPECTOMY Left 02/23/2016  . BREAST LUMPECTOMY WITH RADIOACTIVE SEED LOCALIZATION Left 02/23/2016   Procedure: BREAST LUMPECTOMY WITH RADIOACTIVE SEED LOCALIZATION;  Surgeon: [name], MD;  Location: [facility];  Service: General;  Laterality: Left;  . CARDIAC CATHETERIZATION    . CARDIOVERSION N/A 10/02/2015   Procedure: CARDIOVERSION;  Surgeon: Jerline Pain, MD;  Location: [facility] ENDOSCOPY;  Service: Cardiovascular;  Laterality: N/A;  . CARDIOVERSION N/A 10/05/2018   Procedure: CARDIOVERSION;  Surgeon: Fay Records, MD;  Location: [facility] ENDOSCOPY;  Service: Cardiovascular;  Laterality: N/A;  . CARDIOVERSION N/A 11/05/2019   Procedure: CARDIOVERSION;  Surgeon: Dorothy Spark, MD;  Location: [facility] ENDOSCOPY;  Service: Cardiovascular;  Laterality: N/A;  . CARDIOVERSION N/A 01/29/2021   Procedure: CARDIOVERSION;  Surgeon: Donato Heinz, MD;  Location: [facility];  Service: Cardiovascular;  Laterality: N/A;  . CLIPPING OF ATRIAL APPENDAGE  08/10/2018   Procedure: CLIPPING OF ATRIAL APPENDAGE;  Surgeon: Rexene Alberts, MD;  Location: [facility];  Service: Open Heart Surgery;;  . COLONOSCOPY    . COLONOSCOPY WITH PROPOFOL N/A 07/14/2020   Procedure: COLONOSCOPY WITH PROPOFOL;  Surgeon: Ronnette Juniper, MD;  Location: [facility];  Service: Gastroenterology;  Laterality: N/A;  . CORONARY ARTERY BYPASS GRAFT N/A 08/10/2018   Procedure: CORONARY ARTERY BYPASS GRAFTING (CABG) x 1, LIMA-LAD,  USING LEFT INTERNAL MAMMARY ARTERY. HARVESTED RIGHT GREATER SAPHENOUS VEIN ENDOSCOPICALLY;  Surgeon:  Rexene Alberts, MD;  Location: [facility];  Service: Open Heart Surgery;  Laterality: N/A;  . CYSTOSCOPY W/ RETROGRADES Bilateral 06/20/2019   Procedure: CYSTOSCOPY WITH RETROGRADE PYELOGRAM;  Surgeon: Ardis Hughs, MD;  Location: [facility];  Service: Urology;  Laterality: Bilateral;  . CYSTOSCOPY WITH HYDRODISTENSION AND BIOPSY N/A 06/20/2019   Procedure: CYSTOSCOPY BLADDER  BIOPSY WITH FULGERATION;  Surgeon: Ardis Hughs, MD;  Location: [facility];  Service: Urology;  Laterality: N/A;  . DUPUYTREN CONTRACTURE RELEASE  2001   leftx2  . [place]   right  . DUPUYTREN CONTRACTURE RELEASE Right 05/02/2014   Procedure: EXCISION DUPUYTRENS RIGHT PALMAR/SMALL ;  Surgeon: Cammie Sickle, MD;  Location: [facility];  Service: Orthopedics;  Laterality: Right;  . HEMOSTASIS CLIP PLACEMENT  07/14/2020   Procedure: HEMOSTASIS CLIP PLACEMENT;  Surgeon: Ronnette Juniper, MD;  Location[facility];  Service: Gastroenterology;;  . IR VERTEBROPLASTY CERV/THOR BX INC UNI/BIL INC/INJECT/IMAGING  08/19/2020  . IR VERTEBROPLASTY CERV/THOR BX INC UNI/BIL INC/INJECT/IMAGING  09/15/2020  . MAZE N/A 08/10/2018   Procedure: MAZE;  Surgeon: Rexene Alberts, MD;  Location: [place];  Service: Open Heart Surgery;  Laterality: N/A;  . MITRAL VALVE REPAIR N/A  08/10/2018   Procedure: MITRAL VALVE REPAIR (MVR);  Surgeon: Rexene Alberts, MD;  Location: [facility];  Service: Open Heart Surgery;  Laterality: N/A;  glutaraldehyde  . POLYPECTOMY  07/14/2020   Procedure: POLYPECTOMY;  Surgeon: Ronnette Juniper, MD;  Location: [facility];  Service: Gastroenterology;;  . RIGHT/LEFT HEART CATH AND CORONARY ANGIOGRAPHY N/A 06/20/2018   Procedure: RIGHT/LEFT HEART CATH AND CORONARY ANGIOGRAPHY;  Surgeon: [name], MD;  Location: [facility] [place] CV LAB;  Service: Cardiovascular;  Laterality: N/A;  . TEE WITHOUT CARDIOVERSION N/A 06/20/2018   Procedure: TRANSESOPHAGEAL  ECHOCARDIOGRAM (TEE);  Surgeon: Pixie Casino, MD;  Location: [facility] ENDOSCOPY;  Service: Cardiovascular;  Laterality: N/A;  . TEE WITHOUT CARDIOVERSION N/A 08/10/2018   Procedure: TRANSESOPHAGEAL ECHOCARDIOGRAM (TEE);  Surgeon: Rexene Alberts, MD;  Location: [facility] [place];  Service: Open Heart Surgery;  Laterality: N/A;  . TONSILLECTOMY  age 1     OB History    Gravida  1   Para  1   Term  1   Preterm      AB      Living  1     SAB      IAB      Ectopic      Multiple      Live Births              Family History  Problem Relation Age of Onset  . Cirrhosis Mother   . Breast cancer Mother 28  . Heart failure Father   . Heart attack Father   . Breast cancer Sister        early 4's  . Kidney Stones Sister        loss of kidney due to stones  . Osteoporosis Neg Hx     Social History   Tobacco Use  . Smoking status: Former Smoker    Packs/day: 1.00    Years: 20.00    Pack years: 20.00    Types: Cigarettes    Quit date: 11/22/1992    Years since quitting: 28.3  . Smokeless tobacco: Never Used  Vaping Use  . Vaping Use: Never used  Substance Use Topics  . Alcohol use: Yes    Alcohol/week: 3.0 standard drinks    Types: 3 Standard drinks or equivalent per week    Comment: social  . Drug use: No    Home Medications Prior to Admission medications   Medication Sig Start Date End Date Taking? Authorizing Provider  acetaminophen (TYLENOL) 500 MG tablet Take 500-1,000 mg by mouth every 6 (six) hours as needed for moderate pain or headache.     [provider]  alendronate (FOSAMAX) 70 MG tablet Take 70 mg by mouth once a week. 09/24/20   [provider]  allopurinol (ZYLOPRIM) 100 MG tablet Take 200 mg by mouth every evening.  06/21/13   [provider]  atorvastatin (LIPITOR) 20 MG tablet TAKE 1 TABLET BY MOUTH EVERY DAY Patient taking differently: Take 20 mg by mouth daily. 04/07/20   Camnitz, Ocie Doyne, MD  bisacodyl (DULCOLAX) 10 MG  suppository Place 1 suppository (10 mg total) rectally daily as needed for moderate constipation. 09/19/20   Oswald Hillock, MD  clonazePAM (KLONOPIN) 0.5 MG disintegrating tablet Take 1 tablet (0.5 mg total) by mouth 2 (two) times daily as needed for seizure. 02/02/21   Thurnell Lose, MD  digoxin 62.5 MCG TABS Take 0.0625 mg by mouth daily. 02/03/21   Thurnell Lose, MD  diltiazem (  CARDIZEM CD) 240 MG 24 hr capsule Take 1 capsule (240 mg total) by mouth daily. 02/02/21   Thurnell Lose, MD  furosemide (LASIX) 40 MG tablet Take 1 tablet (40 mg total) by mouth daily. 02/02/21   Thurnell Lose, MD  hydrOXYzine (ATARAX/VISTARIL) 25 MG tablet Take 25 mg by mouth in the morning and at bedtime. 01/14/21   [provider]  mesalamine (APRISO) 0.375 g 24 hr capsule Take 0.375 g by mouth daily. 06/01/19   [provider]  Multiple Vitamin (MULTIVITAMIN WITH MINERALS) TABS tablet Take 1 tablet by mouth daily. 08/24/20   Debbe Odea, MD  oxyCODONE (OXY IR/ROXICODONE) 5 MG immediate release tablet Take 1 tablet (5 mg total) by mouth every 6 (six) hours as needed for severe pain. 02/02/21   Thurnell Lose, MD  potassium chloride 20 MEQ TBCR Take 20 mEq by mouth daily. 02/02/21   Thurnell Lose, MD  predniSONE (DELTASONE) 5 MG tablet Take 1 tablet daily for 5 days, then half tablet daily for 5 days, then stop. 02/02/21   Thurnell Lose, MD  tamoxifen (NOLVADEX) 20 MG tablet Take 20 mg by mouth daily.    [provider]  XARELTO 15 MG TABS tablet TAKE 1 TABLET BY MOUTH  DAILY WITH SUPPER Patient taking differently: Take 15 mg by mouth at bedtime. 10/06/20   [name], MD    Allergies    Shellfish allergy and Amoxicillin-pot clavulanate  Review of Systems   Review of Systems  Musculoskeletal: Positive for neck pain.  All other systems reviewed and are negative.   Physical Exam Updated Vital Signs BP 123/84 (BP Location: Right Arm)   Pulse (!) 139   Temp (!)  100.7 F (38.2 C) (Oral)   Resp (!) 34   SpO2 98%   Physical Exam Vitals and nursing note reviewed.   80 year old female, resting comfortably and in no acute distress. Vital signs are significant for elevated temperature, respiratory rate, heart rate. Oxygen saturation is 98%, which is normal.  She is slightly anxious and restless and constantly pulling at her monitor leads. Head is normocephalic and atraumatic. PERRLA, EOMI. Oropharynx shows moderate erythema with faint exudate.  Voice is slightly muffled.  There is erythema, swelling, induration in the region of the right angle of the mandible.  This is warm to the touch, quite tender and extends down into the neck. Neck is nontender and supple without adenopathy or JVD.  Right-sided swelling and tenderness as noted above. Back is nontender and there is no CVA tenderness. Lungs are clear without rales, wheezes, or rhonchi. Chest is nontender. Heart is tachycardic without murmur. Abdomen is soft, flat, nontender without masses or hepatosplenomegaly and peristalsis is normoactive. Extremities have no cyanosis or edema. Skin is warm and dry without rash. Neurologic: Awake and alert, cranial nerves are intact, moves all extremities equally.  ED Results / Procedures / Treatments   Labs (all labs ordered are listed, but only abnormal results are displayed) Labs Reviewed  CBC WITH DIFFERENTIAL/PLATELET - Abnormal; Notable for the following components:      Result Value   WBC 33.4 (*)    All other components within normal limits  RESP PANEL BY RT-PCR (FLU A&B, COVID) ARPGX2  CULTURE, BLOOD (ROUTINE X 2)  CULTURE, BLOOD (ROUTINE X 2)  URINE CULTURE  GROUP A STREP BY PCR  LACTIC ACID, PLASMA  LACTIC ACID, PLASMA  COMPREHENSIVE METABOLIC PANEL  PROTIME-INR  APTT  URINALYSIS, ROUTINE W  REFLEX MICROSCOPIC    EKG None  Radiology No results found.  Procedures Procedures  CRITICAL CARE Performed by: Delora Fuel Total critical  care time: 45 minutes Critical care time was exclusive of separately billable procedures and treating other patients. Critical care was necessary to treat or prevent imminent or life-threatening deterioration. Critical care was time spent personally by me on the following activities: development of treatment plan with patient and/or surrogate as well as nursing, discussions with consultants, evaluation of patient's response to treatment, examination of patient, obtaining history from patient or surrogate, ordering and performing treatments and interventions, ordering and review of laboratory studies, ordering and review of radiographic studies, pulse oximetry and re-evaluation of patient's condition.  Medications Ordered in ED Medications  lactated ringers infusion (has no administration in time range)  lactated ringers bolus 1,000 mL (has no administration in time range)  ceFEPIme (MAXIPIME) 2 g in sodium chloride 0.9 % 100 mL IVPB (has no administration in time range)  metroNIDAZOLE (FLAGYL) IVPB 500 mg (has no administration in time range)  vancomycin (VANCOCIN) IVPB 1000 mg/200 mL premix (has no administration in time range)  acetaminophen (TYLENOL) tablet 650 mg (has no administration in time range)  hydrocortisone sodium succinate (SOLU-CORTEF) 100 MG injection 100 mg (has no administration in time range)    ED Course  I have reviewed the triage vital signs and the nursing notes.  Pertinent labs & imaging results that were available during my care of the patient were reviewed by me and considered in my medical decision making (see chart for details).  MDM Rules/Calculators/A&P Fever with neck swelling and muffled voice concerning for deep infection.  With tachycardia and tachypnea, she meets SIRS criteria.  Old records are reviewed, and she had a hospitalization in March for sepsis secondary to UTI.  Code sepsis is activated, patient is started on IV fluids, antibiotics.  Because of  history of Addison's disease, she is also given a dose of hydrocortisone.  In addition to standard sepsis evaluation, will get CT of neck.  Please note that patient is under hospice care and is DNR.    WBC is markedly elevated at 33.4, consistent with severe infection.  She is pending the rest of her labs as well as chest x-ray and CT of the neck.  Case is signed out to Dr. Ronnald Nian.  Final Clinical Impression(s) / ED Diagnoses Final diagnoses:  Neck infection  Sepsis due to undetermined organism [facility])  Addison's disease [facility])    Rx / DC Orders ED Discharge Orders    None       Delora Fuel, MD 42/70/62 [phone]

## 2021-03-14 NOTE — ED Notes (Signed)
Report called to Melvia Heaps RN on [place].

## 2021-03-14 NOTE — ED Notes (Signed)
Pt adamantly refuses I/O cath. Currently receiving IVF and will attempt a void in 1 hour.

## 2021-03-14 NOTE — Progress Notes (Signed)
   03/14/21 1600  Assess: MEWS Score  Temp 98 F (36.7 C)  BP 104/64  Pulse Rate (!) 133  ECG Heart Rate (!) 132  Resp 20  Level of Consciousness Alert  SpO2 98 %  O2 Device Nasal Cannula  O2 Flow Rate (L/min) 2 L/min  Assess: MEWS Score  MEWS Temp 0  MEWS Systolic 0  MEWS Pulse 3  MEWS RR 0  MEWS LOC 0  MEWS Score 3  MEWS Score Color Yellow  Assess: if the MEWS score is Yellow or Red  Were vital signs taken at a resting state? Yes  Focused Assessment No change from prior assessment  Early Detection of Sepsis Score *See Row Information* High  MEWS guidelines implemented *See Row Information* Yes  Treat  MEWS Interventions Administered prn meds/treatments  Pain Score 8  Pain Location Generalized  Pain Descriptors / Indicators Aching  Pain Frequency Constant  Patients Stated Pain Goal 0  Pain Intervention(s) Medication (See eMAR) (md at bedside, ordering pain meds)  Complains of Restless;Coughing  Interventions Dark room;Relaxation;Reposition  Patients response to intervention Relief  Take Vital Signs  Increase Vital Sign Frequency  Yellow: Q 2hr X 2 then Q 4hr X 2, if remains yellow, continue Q 4hrs  Escalate  MEWS: Escalate Yellow: discuss with charge nurse/RN and consider discussing with provider and RRT  Notify: Charge Nurse/RN  Name of Charge Nurse/RN Notified Christy RN  Date Charge Nurse/RN Notified 03/14/21  Time Charge Nurse/RN Notified 1630  Notify: Provider  Provider Name/Title Karmen Bongo MD  Date Provider Notified 03/14/21  Time Provider Notified 1615  Notification Type Face-to-face  Notification Reason Other (Comment) (at bedside rounding)  Provider response At bedside;See new orders  Date of Provider Response 03/14/21  Time of Provider Response 1615  Notify: Rapid Response  Name of Rapid Response RN Notified  (not needed)  Document  Patient Outcome Stabilized after interventions;Other (Comment) (patient on hospice at SNF, stated goal of  care is comfort and to treat if it doesn't cause "too much trouble")  Progress note created (see row info) Yes

## 2021-03-15 ENCOUNTER — Inpatient Hospital Stay (HOSPITAL_COMMUNITY)

## 2021-03-15 DIAGNOSIS — R7881 Bacteremia: Secondary | ICD-10-CM

## 2021-03-15 DIAGNOSIS — S81809S Unspecified open wound, unspecified lower leg, sequela: Secondary | ICD-10-CM

## 2021-03-15 DIAGNOSIS — I361 Nonrheumatic tricuspid (valve) insufficiency: Secondary | ICD-10-CM

## 2021-03-15 DIAGNOSIS — S81809A Unspecified open wound, unspecified lower leg, initial encounter: Secondary | ICD-10-CM

## 2021-03-15 DIAGNOSIS — I340 Nonrheumatic mitral (valve) insufficiency: Secondary | ICD-10-CM

## 2021-03-15 DIAGNOSIS — I342 Nonrheumatic mitral (valve) stenosis: Secondary | ICD-10-CM

## 2021-03-15 DIAGNOSIS — B9562 Methicillin resistant Staphylococcus aureus infection as the cause of diseases classified elsewhere: Secondary | ICD-10-CM

## 2021-03-15 DIAGNOSIS — R55 Syncope and collapse: Secondary | ICD-10-CM

## 2021-03-15 LAB — CBC
HCT: 30.5 % — ABNORMAL LOW (ref 36.0–46.0)
Hemoglobin: 10.1 g/dL — ABNORMAL LOW (ref 12.0–15.0)
MCH: 30.4 pg (ref 26.0–34.0)
MCHC: 33.1 g/dL (ref 30.0–36.0)
MCV: 91.9 fL (ref 80.0–100.0)
Platelets: 153 10*3/uL (ref 150–400)
RBC: 3.32 MIL/uL — ABNORMAL LOW (ref 3.87–5.11)
RDW: 15.5 % (ref 11.5–15.5)
WBC: 24.9 10*3/uL — ABNORMAL HIGH (ref 4.0–10.5)
nRBC: 0 % (ref 0.0–0.2)

## 2021-03-15 LAB — DIGOXIN LEVEL: Digoxin Level: 1.2 ng/mL (ref 0.8–2.0)

## 2021-03-15 LAB — BASIC METABOLIC PANEL
Anion gap: 8 (ref 5–15)
BUN: 16 mg/dL (ref 8–23)
CO2: 23 mmol/L (ref 22–32)
Calcium: 7.3 mg/dL — ABNORMAL LOW (ref 8.9–10.3)
Chloride: 103 mmol/L (ref 98–111)
Creatinine, Ser: 1.01 mg/dL — ABNORMAL HIGH (ref 0.44–1.00)
GFR, Estimated: 56 mL/min — ABNORMAL LOW (ref >60)
Glucose, Bld: 116 mg/dL — ABNORMAL HIGH (ref 70–99)
Potassium: 3.6 mmol/L (ref 3.5–5.1)
Sodium: 134 mmol/L — ABNORMAL LOW (ref 135–145)

## 2021-03-15 LAB — ECHOCARDIOGRAM COMPLETE
AR max vel: 2.04 cm2
AV Area VTI: 2.33 cm2
AV Area mean vel: 2.09 cm2
AV Mean grad: 4.5 mmHg
AV Peak grad: 8.1 mmHg
Ao pk vel: 1.43 m/s
Area-P 1/2: 2.82 cm2
Height: 63 in
MV VTI: 1.5 cm2
S' Lateral: 2.3 cm
Weight: 1727.98 oz

## 2021-03-15 LAB — BLOOD CULTURE ID PANEL (REFLEXED) - BCID2

## 2021-03-15 LAB — URINE CULTURE: Culture: NO GROWTH

## 2021-03-15 MED ORDER — VANCOMYCIN HCL 1000 MG/200ML IV SOLN
1000.0000 mg | INTRAVENOUS | Status: DC
  Administered 2021-03-16: 1000 mg via INTRAVENOUS
  Filled 2021-03-15: qty 200

## 2021-03-15 MED ORDER — DILTIAZEM HCL ER COATED BEADS 120 MG PO CP24
120.0000 mg | ORAL_CAPSULE | Freq: Every day | ORAL | Status: DC
  Administered 2021-03-15 – 2021-03-21 (×7): 120 mg via ORAL
  Filled 2021-03-15 (×8): qty 1

## 2021-03-15 MED ORDER — MESALAMINE ER 0.375 G PO CP24: 0.3750 g | ORAL_CAPSULE | Freq: Every morning | ORAL | Status: DC

## 2021-03-15 MED ORDER — METRONIDAZOLE 500 MG PO TABS
500.0000 mg | ORAL_TABLET | Freq: Three times a day (TID) | ORAL | Status: DC
  Administered 2021-03-15 – 2021-03-24 (×28): 500 mg via ORAL
  Filled 2021-03-15 (×28): qty 1

## 2021-03-15 MED ORDER — MESALAMINE 400 MG PO CPDR
400.0000 mg | DELAYED_RELEASE_CAPSULE | Freq: Every day | ORAL | Status: DC
  Administered 2021-03-16 – 2021-03-24 (×9): 400 mg via ORAL
  Filled 2021-03-15 (×9): qty 1

## 2021-03-15 MED ORDER — CLONAZEPAM 0.5 MG PO TABS
0.5000 mg | ORAL_TABLET | Freq: Two times a day (BID) | ORAL | Status: DC | PRN
  Filled 2021-03-15: qty 1

## 2021-03-15 NOTE — Progress Notes (Incomplete)
  Echocardiogram 2D Echocardiogram has been performed.  Michele Green  Lynnette Caffey 03/15/2021, 8:45 AM

## 2021-03-15 NOTE — Plan of Care (Signed)

## 2021-03-15 NOTE — Progress Notes (Addendum)
[facility]) hospitalized hospice patient visit.   Michele Green is a current [facility] hospice patient from [facility] with a terminal diagnosis of hypertensive heart disease. Facility notified [facility] on call that patient was experiencing swelling in right neck with pain. After assessment and discussing with patient's son, the decision was made to have EMS transport her to ED for evaluation. Patient was admitted to [facility] on 4/23 with a diagnosis of Sepsis due to acute parotiditis and MRSA bacteremia. Per [facility] physician Dr. Gilford Rile, this is a related admission.   Visited at bedside. Not visitors present. Received report from bedside nurse. Patient is alert and oriented. She had just received a bath and was sitting up in bed eating pudding cup. She reports that her grandchildren had visited earlier and she is expecting her son later in the day. She is currently receiving IV Cardizem for Afib rate control.   VS: 98.2, 111/63, 98, 94%ra I/O: 2338.7/not documented  Abn Labs:  03/15/2021 06:57 Sodium: 134 (L) Glucose: 116 (H) Creatinine: 1.01 (H) Calcium: 7.3 (L) GFR, Estimated: 56 (L) WBC: 24.9 (H) RBC: 3.32 (L) Hemoglobin: 10.1 (L) HCT: 30.5 (L)  Epic Imaging: CT neck IMPRESSION: Acute parotiditis on the right. No abscess or obstructing process. Secondary inflammation involves the right neck and pharynx.  IV/PRN medications:  Cardizem titrated Maintain HR 65-105 Vancomycin 1051m IVPB Q48hrs Oxycodone 570mPRN Q4 hours @ 0929  Problem list: Sepsis due to acute parotiditis and MRSA bacteremia -Presented with leukocytosis, fever, tachycardia, tachypnea  Initially started on Unasyn on 03/14/2021, switched to IV vancomycin and p.o. Flagyl on  03/15/2021 due to MRSA bacteremia.  2D echo results are pending.  Repeat blood cultures x2 peripherally on 03/16/2021.  Infectious disease following MRSA bacteremia, possibly wound care source of infection. Chronic A.  Fib/flutter Leg wounds, present on admission.  Discharge planning: [facility] for DC Family Contact: spoke with son by phone.  IDG; Updated Goals of Care: Clear, DNR  Should patient need ambulance transport at discharge, please use [facility]   A Please do not hesitate to call with questions.   Thank you,   MaFarrel GordonRN, CCElko New Marketlisted on [facility] [place]   [phone]

## 2021-03-15 NOTE — Progress Notes (Signed)
PROGRESS NOTE  DORLA GUIZAR RFF:[id number] DOB: [birth year] DOA: 03/14/2021 PCP: [name], MD  HPI/Recap of past 24 hours:  MYLANI GENTRY is a 80 y.o. female with medical history significant of UC; paroxysmal A. fib on Xarelto and digoxin s/p Maze; CAD s/p CABG; chronic diastolic dysfunction; Addison's disease; breast cancer; COVID in 11/2020; HTN; and HLD who presented to [facility] with neck pain. She was hospitalized in Jan 2022 for COVID PNA and afib with RVR.  She was then hospitalized in late February for afib with RVR and steroid-induced adrenal insufficiency.  She was admitted again on 3/5 for afib with RvR and transferred to [facility] with DCCV cardioversrion on 3/10.  She converted to NSR but reverted back to aflutter.  She obtained rate control with IV Digoxin and was discharged back to SNF on 3/14.  She developed R lateral face and neck pain and presented to [facility] where she was found to have SIRS criteria; code sepsis was activated.  She confirmed DNR; reports extreme fatigue; and voiced an interest in some minimal treatments to try to help her to improve but with a clear goal of comfort.  03/15/21: Patient was seen and examined at bedside.  She is mildly confused.  She is calm and attempting to answer questions appropriately.  She denies having any pain at the time of this visit.  CT soft tissues neck with contrast done on 03/14/2021 revealed acute parotiditis on the right.  She was initially started on broad-spectrum IV antibiotic Unasyn.  Blood cultures came back positive for MRSA, was switched to IV vancomycin with oral Flagyl added by ID.  Assessment/Plan: Principal Problem:   MRSA bacteremia Active Problems:   Essential hypertension   Atrial flutter with rapid ventricular response (HCC)   Goals of care, counseling/discussion   Adrenal insufficiency (Addison's disease) ([place])   Functional quadriplegia (HCC)   Acute parotitis   Sepsis ([place])   Multiple open wounds of lower  extremity  Sepsis due to acute parotiditis and MRSA bacteremia Presented with leukocytosis, fever, tachycardia, tachypnea Initially started on Unasyn on 03/14/2021, switched to IV vancomycin and p.o. Flagyl on 03/15/2021 due to MRSA bacteremia. 2D echo results are pending. Repeat blood cultures x2 peripherally on 03/16/2021. Infectious disease following, appreciate assistance.  MRSA bacteremia, possibly wound care source of infection. ID following Follow 2D echo results Repeat blood cultures x2 peripherally on 03/16/2021. Monitor fever curve and WBC.  Chronic A. Fib/flutter Continue diltiazem She is on home p.o. digoxin Obtain dig level Continue Xarelto for CVA prevention If evidence of endocarditis, DC Xarelto due to high risk of hemorrhagic conversion.  Leg wounds, present on admission. Continue local wound care  History of breast cancer She is no longer on tamoxifen Hospice patient  Essential hypertension/hyperlipidemia At home she was on p.o. Lasix 40 mg daily, p.o. diltiazem to 40 mg daily, digoxin 62.5 mcg daily Monitor vital signs She is currently not on any statin drugs  Ulcerative colitis There is no appear to be in flare Continue home mesalamine  Chronic anxiety/depression Resume home regimen.   Code Status: DNR  Family Communication: None at bedside, patient has a son called [name].  Disposition Plan: Likely will discharge to home with hospice care when ID signs off.   Consultants:  Infectious disease   Procedures:  2D echo.  Antimicrobials:  IV vancomycin  P.o. Flagyl.  DVT prophylaxis: Xarelto.  Status is: Inpatient    Dispo:  Patient From: Home  Planned Disposition: Home, 03/17/2021 or when  ID signs of.  Medically stable for discharge: No, ongoing management of sepsis secondary to acute parotiditis and MRSA bacteremia          Objective: Vitals:   03/15/21 0427 03/15/21 0630 03/15/21 0849 03/15/21 0900  BP: 97/62 107/60 (!)  88/72 (!) 99/59  Pulse: (!) 122 (!) 123 (!) 108   Resp: 16  14 16   Temp: 98 F (36.7 C)  98.2 F (36.8 C)   TempSrc: Oral  Oral   SpO2: 98% 94% 96% 94%  Weight:      Height:        Intake/Output Summary (Last 24 hours) at 03/15/2021 1531 Last data filed at 03/15/2021 0300 Gross per 24 hour  Intake 2488.69 ml  Output 401 ml  Net 2087.69 ml   Filed Weights   03/14/21 0745 03/14/21 1850  Weight: 49 kg 49 kg    Exam:  . General: 80 y.o. year-old female very frail appearing but in no acute distress.  She is alert and oriented x2.   . Cardiovascular: Regular rate and rhythm no rubs or gallops.   [name] Respiratory: Clear to auscultation no wheezes or rales.   . Abdomen: Soft nontender normal bowel sounds present.   . Musculoskeletal: No lower extremity edema bilaterally. . Skin: No ulcerative lesions noted.   [name] Psychiatry: Mood is appropriate for condition and setting.  Data Reviewed: CBC: Recent Labs  Lab 03/14/21 0631 03/15/21 0657  WBC 33.4* 24.9*  NEUTROABS 30.7*  --   HGB 12.9 10.1*  HCT 38.5 30.5*  MCV 90.4 91.9  PLT 219 923   Basic Metabolic Panel: Recent Labs  Lab 03/14/21 0631 03/15/21 0657  NA 136 134*  K 4.3 3.6  CL 96* 103  CO2 20* 23  GLUCOSE 136* 116*  BUN 16 16  CREATININE 1.12* 1.01*  CALCIUM 8.7* 7.3*   GFR: Estimated Creatinine Clearance: 33.2 mL/min (A) (by C-G formula based on SCr of 1.01 mg/dL (H)). Liver Function Tests: Recent Labs  Lab 03/14/21 0631  AST 16  ALT 11  ALKPHOS 58  BILITOT 1.1  PROT 6.3*  ALBUMIN 3.0*   No results for input(s): LIPASE, AMYLASE in the last 168 hours. No results for input(s): AMMONIA in the last 168 hours. Coagulation Profile: Recent Labs  Lab 03/14/21 0631  INR 1.3*   Cardiac Enzymes: No results for input(s): CKTOTAL, CKMB, CKMBINDEX, TROPONINI in the last 168 hours. BNP (last 3 results) No results for input(s): PROBNP in the last 8760 hours. HbA1C: No results for input(s): HGBA1C in the last  72 hours. CBG: Recent Labs  Lab 03/14/21 1042  GLUCAP 157*   Lipid Profile: No results for input(s): CHOL, HDL, LDLCALC, TRIG, CHOLHDL, LDLDIRECT in the last 72 hours. Thyroid Function Tests: No results for input(s): TSH, T4TOTAL, FREET4, T3FREE, THYROIDAB in the last 72 hours. Anemia Panel: No results for input(s): VITAMINB12, FOLATE, FERRITIN, TIBC, IRON, RETICCTPCT in the last 72 hours. Urine analysis:    Component Value Date/Time   COLORURINE YELLOW 03/14/2021 1044   APPEARANCEUR CLEAR 03/14/2021 1044   LABSPEC >1.046 (H) 03/14/2021 1044   PHURINE 5.5 03/14/2021 1044   GLUCOSEU NEGATIVE 03/14/2021 1044   HGBUR NEGATIVE 03/14/2021 1044   BILIRUBINUR NEGATIVE 03/14/2021 1044   KETONESUR 15 (A) 03/14/2021 1044   PROTEINUR TRACE (A) 03/14/2021 1044   NITRITE NEGATIVE 03/14/2021 1044   LEUKOCYTESUR NEGATIVE 03/14/2021 1044   Sepsis Labs: @LABRCNTIP (procalcitonin:4,lacticidven:4)  ) Recent Results (from the past 240 hour(s))  Resp Panel by RT-PCR (  Flu A&B, Covid) Nasopharyngeal Swab     Status: None   Collection Time: 03/14/21  6:31 AM   Specimen: Nasopharyngeal Swab; Nasopharyngeal(NP) swabs in vial transport medium  Result Value Ref Range Status   SARS Coronavirus 2 by RT PCR NEGATIVE NEGATIVE Final    Comment: (NOTE) SARS-CoV-2 target nucleic acids are NOT DETECTED.  The SARS-CoV-2 RNA is generally detectable in upper respiratory specimens during the acute phase of infection. The lowest concentration of SARS-CoV-2 viral copies this assay can detect is 138 copies/mL. A negative result does not preclude SARS-Cov-2 infection and should not be used as the sole basis for treatment or other patient management decisions. A negative result may occur with  improper specimen collection/handling, submission of specimen other than nasopharyngeal swab, presence of viral mutation(s) within the areas targeted by this assay, and inadequate number of viral copies(<138 copies/mL). A  negative result must be combined with clinical observations, patient history, and epidemiological information. The expected result is Negative.  Fact Sheet for Patients:  EntrepreneurPulse.com.au  Fact Sheet for Healthcare Providers:  IncredibleEmployment.be  This test is no t yet approved or cleared by the [place] FDA and  has been authorized for detection and/or diagnosis of SARS-CoV-2 by FDA under an Emergency Use Authorization (EUA). This EUA will remain  in effect (meaning this test can be used) for the duration of the COVID-19 declaration under Section 564(b)(1) of the Act, 21 U.S.C.section 360bbb-3(b)(1), unless the authorization is terminated  or revoked sooner.       Influenza A by PCR NEGATIVE NEGATIVE Final   Influenza B by PCR NEGATIVE NEGATIVE Final    Comment: (NOTE) The Xpert Xpress SARS-CoV-2/FLU/RSV plus assay is intended as an aid in the diagnosis of influenza from Nasopharyngeal swab specimens and should not be used as a sole basis for treatment. Nasal washings and aspirates are unacceptable for Xpert Xpress SARS-CoV-2/FLU/RSV testing.  Fact Sheet for Patients: EntrepreneurPulse.com.au  Fact Sheet for Healthcare Providers: IncredibleEmployment.be  This test is not yet approved or cleared by the [place] FDA and has been authorized for detection and/or diagnosis of SARS-CoV-2 by FDA under an Emergency Use Authorization (EUA). This EUA will remain in effect (meaning this test can be used) for the duration of the COVID-19 declaration under Section 564(b)(1) of the Act, 21 U.S.C. section 360bbb-3(b)(1), unless the authorization is terminated or revoked.  Performed at [facility]   Blood Culture (routine x 2)     Status: None (Preliminary result)   Collection Time: 03/14/21  6:31 AM   Specimen: BLOOD  Result Value Ref Range Status   Specimen Description    Final    BLOOD BLOOD LEFT FOREARM Performed at [facility]    Special Requests   Final    BOTTLES DRAWN AEROBIC AND ANAEROBIC Blood Culture adequate volume Performed at [facility]    Culture   Final    NO GROWTH < 24 HOURS Performed at [facility], [place] [address] [place], [place] [address]    Report Status PENDING  Incomplete  Group A Strep by PCR     Status: None   Collection Time: 03/14/21  6:31 AM   Specimen: Peripheral; Sterile Swab  Result Value Ref Range Status   Group A Strep by PCR NOT DETECTED NOT DETECTED Final    Comment: Performed at [facility]  Blood Culture (routine x 2)     Status: None (Preliminary result)   Collection  Time: 03/14/21  6:36 AM   Specimen: BLOOD  Result Value Ref Range Status   Specimen Description   Final    BLOOD BLOOD LEFT WRIST Performed at [facility]    Special Requests   Final    BOTTLES DRAWN AEROBIC AND ANAEROBIC Blood Culture adequate volume Performed at [facility]    Culture  Setup Time   Final    GRAM POSITIVE COCCI AEROBIC BOTTLE ONLY CRITICAL RESULT CALLED TO, READ BACK BY AND VERIFIED WITH: Beulah Gandy 03/15/2021 at [place].Ysidro Evert Performed at [facility], [place] [address], [place], [place] [address]    Culture GRAM POSITIVE COCCI  Final   Report Status PENDING  Incomplete  Blood Culture ID Panel (Reflexed)     Status: Abnormal   Collection Time: 03/14/21  6:36 AM  Result Value Ref Range Status   Enterococcus faecalis NOT DETECTED NOT DETECTED Final   Enterococcus Faecium NOT DETECTED NOT DETECTED Final   Listeria monocytogenes NOT DETECTED NOT DETECTED Final   Staphylococcus species DETECTED (A) NOT DETECTED Final    Comment: CRITICAL RESULT CALLED TO, READ BACK BY AND VERIFIED WITH: PHARMD JAMES LEDFORD 03/15/2021 AT 0503 A.HUGHES    Staphylococcus aureus (BCID) DETECTED (A) NOT DETECTED Final    Comment:  Methicillin (oxacillin)-resistant Staphylococcus aureus (MRSA). MRSA is predictably resistant to beta-lactam antibiotics (except ceftaroline). Preferred therapy is vancomycin unless clinically contraindicated. Patient requires contact precautions if  hospitalized. CRITICAL RESULT CALLED TO, READ BACK BY AND VERIFIED WITH: PHARMD JAMES LEDFORD 03/15/2021 AT 0503 A.HUGHES    Staphylococcus epidermidis NOT DETECTED NOT DETECTED Final   Staphylococcus lugdunensis NOT DETECTED NOT DETECTED Final   Streptococcus species NOT DETECTED NOT DETECTED Final   Streptococcus agalactiae NOT DETECTED NOT DETECTED Final   Streptococcus pneumoniae NOT DETECTED NOT DETECTED Final   Streptococcus pyogenes NOT DETECTED NOT DETECTED Final   A.calcoaceticus-baumannii NOT DETECTED NOT DETECTED Final   Bacteroides fragilis NOT DETECTED NOT DETECTED Final   Enterobacterales NOT DETECTED NOT DETECTED Final   Enterobacter cloacae complex NOT DETECTED NOT DETECTED Final   Escherichia coli NOT DETECTED NOT DETECTED Final   Klebsiella aerogenes NOT DETECTED NOT DETECTED Final   Klebsiella oxytoca NOT DETECTED NOT DETECTED Final   Klebsiella pneumoniae NOT DETECTED NOT DETECTED Final   Proteus species NOT DETECTED NOT DETECTED Final   Salmonella species NOT DETECTED NOT DETECTED Final   Serratia marcescens NOT DETECTED NOT DETECTED Final   Haemophilus influenzae NOT DETECTED NOT DETECTED Final   Neisseria meningitidis NOT DETECTED NOT DETECTED Final   Pseudomonas aeruginosa NOT DETECTED NOT DETECTED Final   Stenotrophomonas maltophilia NOT DETECTED NOT DETECTED Final   Candida albicans NOT DETECTED NOT DETECTED Final   Candida auris NOT DETECTED NOT DETECTED Final   Candida glabrata NOT DETECTED NOT DETECTED Final   Candida krusei NOT DETECTED NOT DETECTED Final   Candida parapsilosis NOT DETECTED NOT DETECTED Final   Candida tropicalis NOT DETECTED NOT DETECTED Final   Cryptococcus neoformans/gattii NOT DETECTED  NOT DETECTED Final   Meth resistant mecA/C and MREJ DETECTED (A) NOT DETECTED Final    Comment: CRITICAL RESULT CALLED TO, READ BACK BY AND VERIFIED WITH: PHARMD JAMES LEDFORD 03/15/2021 AT 0503 A.HUGHES Performed at [facility], [place] [address] [place], [place] [address]   Urine culture     Status: None   Collection Time: 03/14/21 10:44 AM   Specimen: In/Out Cath Urine  Result Value Ref Range Status  Specimen Description   Final    IN/OUT CATH URINE Performed at [facility]    Special Requests NONE Performed at [facility]   Final   Culture   Final    NO GROWTH Performed at [facility], [place] [address] [place], [place] [address]    Report Status 03/15/2021 FINAL  Final  MRSA PCR Screening     Status: Abnormal   Collection Time: 03/14/21  6:00 PM   Specimen: Nasopharyngeal  Result Value Ref Range Status   MRSA by PCR POSITIVE (A) NEGATIVE Final    Comment:        The GeneXpert MRSA Assay (FDA approved for NASAL specimens only), is one component of a comprehensive MRSA colonization surveillance program. It is not intended to diagnose MRSA infection nor to guide or monitor treatment for MRSA infections. RESULT CALLED TO, READ BACK BY AND VERIFIED WITH: K,HOLDEN RN @2022  03/14/21 EB Performed at [place] [address], [place], [place] [address]   Respiratory (~20 pathogens) panel by PCR     Status: None   Collection Time: 03/14/21  6:30 PM  Result Value Ref Range Status   Adenovirus NOT DETECTED NOT DETECTED Final   Coronavirus 229E NOT DETECTED NOT DETECTED Final    Comment: (NOTE) The Coronavirus on the Respiratory Panel, DOES NOT test for the novel  Coronavirus (2019 nCoV)    Coronavirus HKU1 NOT DETECTED NOT DETECTED Final   Coronavirus NL63 NOT DETECTED NOT DETECTED Final   Coronavirus OC43 NOT DETECTED NOT DETECTED Final   Metapneumovirus NOT DETECTED NOT DETECTED Final   Rhinovirus / Enterovirus  NOT DETECTED NOT DETECTED Final   Influenza A NOT DETECTED NOT DETECTED Final   Influenza B NOT DETECTED NOT DETECTED Final   Parainfluenza Virus 1 NOT DETECTED NOT DETECTED Final   Parainfluenza Virus 2 NOT DETECTED NOT DETECTED Final   Parainfluenza Virus 3 NOT DETECTED NOT DETECTED Final   Parainfluenza Virus 4 NOT DETECTED NOT DETECTED Final   Respiratory Syncytial Virus NOT DETECTED NOT DETECTED Final   Bordetella pertussis NOT DETECTED NOT DETECTED Final   Bordetella Parapertussis NOT DETECTED NOT DETECTED Final   Chlamydophila pneumoniae NOT DETECTED NOT DETECTED Final   Mycoplasma pneumoniae NOT DETECTED NOT DETECTED Final    Comment: Performed at [facility], [place]. [address], [place], [place] [address]      Studies: ECHOCARDIOGRAM COMPLETE  Result Date: 03/15/2021    ECHOCARDIOGRAM REPORT   Patient Name:   JAMARIYA DAVIDOFF Date of Exam: 03/15/2021 Medical Rec #:  [id number]        Height:       63.0 in Accession #:    [id number]       Weight:       108.0 lb Date of Birth:  [birth year]        BSA:          1.488 m Patient Age:    1 years         BP:           107/60 mmHg Patient Gender: F                HR:           123 bpm. Exam Location:  Inpatient Procedure: 2D Echo, Cardiac Doppler and Color Doppler Indications:    Syncope  History:        Patient has prior history of Echocardiogram examinations, most  recent 07/17/2021. CAD, Prior CABG; Risk Factors:Hypertension.                  Mitral Valve: prosthetic annuloplasty ring valve is present in                 the mitral position.  Sonographer:    Cammy Brochure Referring Phys: [id number] [place]  1. Left ventricular ejection fraction, by estimation, is 60 to 65%. The left ventricle has normal function. The left ventricle has no regional wall motion abnormalities. Left ventricular diastolic parameters are indeterminate.  2. Right ventricular systolic function is normal. The right ventricular size  is normal.  3. Left atrial size was severely dilated.  4. The mitral valve has been repaired/replaced. Mild mitral valve regurgitation. Mild mitral stenosis. There is a prosthetic annuloplasty ring present in the mitral position.  5. Tricuspid valve regurgitation is mild to moderate.  6. The aortic valve is tricuspid. Aortic valve regurgitation is not visualized. No aortic stenosis is present. FINDINGS  Left Ventricle: Left ventricular ejection fraction, by estimation, is 60 to 65%. The left ventricle has normal function. The left ventricle has no regional wall motion abnormalities. The left ventricular internal cavity size was normal in size. There is  no left ventricular hypertrophy. Left ventricular diastolic parameters are indeterminate. Right Ventricle: The right ventricular size is normal.Right ventricular systolic function is normal. Left Atrium: Left atrial size was severely dilated. Right Atrium: Right atrial size was normal in size. Pericardium: There is no evidence of pericardial effusion. Mitral Valve: The mitral valve has been repaired/replaced. Mild mitral valve regurgitation. There is a prosthetic annuloplasty ring present in the mitral position. Mild mitral valve stenosis. MV peak gradient, 15.1 mmHg. The mean mitral valve gradient is  6.0 mmHg. Tricuspid Valve: The tricuspid valve is normal in structure. Tricuspid valve regurgitation is mild to moderate. No evidence of tricuspid stenosis. Aortic Valve: The aortic valve is tricuspid. Aortic valve regurgitation is not visualized. No aortic stenosis is present. Aortic valve mean gradient measures 4.5 mmHg. Aortic valve peak gradient measures 8.1 mmHg. Aortic valve area, by VTI measures 2.33 cm. Pulmonic Valve: The pulmonic valve was normal in structure. Pulmonic valve regurgitation is trivial. No evidence of pulmonic stenosis. Aorta: The aortic root is normal in size and structure. Venous: The inferior vena cava was not well visualized. IAS/Shunts: No  atrial level shunt detected by color flow Doppler.  LEFT VENTRICLE PLAX 2D LVIDd:         3.30 cm  Diastology LVIDs:         2.30 cm  LV e' medial:   5.22 cm/s LV PW:         1.00 cm  LV E/e' medial: 33.3 LV IVS:        1.00 cm LVOT diam:     1.80 cm LV SV:         47 LV SV Index:   32 LVOT Area:     2.54 cm  RIGHT VENTRICLE RV S prime:     8.05 cm/s LEFT ATRIUM              Index       RIGHT ATRIUM           Index LA diam:        5.10 cm  3.43 cm/m  RA Area:     16.90 cm LA Vol (A2C):   113.0 ml 75.93 ml/m RA Volume:   41.50 ml  27.89 ml/m LA Vol (A4C):   113.0 ml 75.93 ml/m LA Biplane Vol: 117.0 ml 78.62 ml/m  AORTIC VALVE AV Area (Vmax):    2.04 cm AV Area (Vmean):   2.09 cm AV Area (VTI):     2.33 cm AV Vmax:           142.50 cm/s AV Vmean:          101.950 cm/s AV VTI:            0.204 m AV Peak Grad:      8.1 mmHg AV Mean Grad:      4.5 mmHg LVOT Vmax:         114.00 cm/s LVOT Vmean:        83.800 cm/s LVOT VTI:          0.186 m LVOT/AV VTI ratio: 0.91  AORTA Ao Root diam: 3.10 cm Ao Asc diam:  2.80 cm MITRAL VALVE                TRICUSPID VALVE MV Area (PHT): 2.82 cm     TR Peak grad:   37.2 mmHg MV Area VTI:   1.50 cm     TR Vmax:        305.00 cm/s MV Peak grad:  15.1 mmHg MV Mean grad:  6.0 mmHg     SHUNTS MV Vmax:       1.94 m/s     Systemic VTI:  0.19 m MV Vmean:      116.0 cm/s   Systemic Diam: 1.80 cm MV Decel Time: 269 msec MV E velocity: 174.00 cm/s Kirk Ruths MD Electronically signed by Kirk Ruths MD Signature Date/Time: 03/15/2021/10:53:56 AM    Final     Scheduled Meds: . Chlorhexidine Gluconate Cloth  6 each Topical Q0600  . digoxin  0.0625 mg Oral Daily  . escitalopram  10 mg Oral Daily  . feeding supplement  237 mL Oral BID BM  . hydrocortisone sod succinate (SOLU-CORTEF) inj  50 mg Intravenous Q6H  . hydrOXYzine  25 mg Oral BID  . metroNIDAZOLE  500 mg Oral Q8H  . mupirocin ointment  1 application Nasal BID  . Rivaroxaban  15 mg Oral QHS  . sodium chloride flush   3 mL Intravenous Q12H    Continuous Infusions: . diltiazem (CARDIZEM) infusion 5 mg/hr (03/15/21 0936)  . [START ON 03/16/2021] vancomycin       LOS: 1 day     Kayleen Memos, MD [facility] Pager [phone]  If 7PM-7AM, please contact night-coverage [email or web address] Password [facility] 03/15/2021, 3:31 PM

## 2021-03-15 NOTE — Progress Notes (Signed)
PHARMACY - PHYSICIAN COMMUNICATION CRITICAL VALUE ALERT - BLOOD CULTURE IDENTIFICATION (BCID)  Michele Green is an 80 y.o. female who presented to [facility] on [facility]/14/2021 with a chief complaint of sepsis  Assessment:  Sepsis due to acute parotitis, also has leg wounds, found to have MRSA bacteremia  Name of physician (or Provider) Contacted: Dr. Cyd Silence  Current antibiotics: Unasyn  Changes to prescribed antibiotics recommended:  Add vancomycin 1000 mg IV q48h  >>Estimated AUC: 484 Cont Unasyn for now Trend WBC, temp, renal function  F/U infectious work-up Drug levels as indicated   Results for orders placed or performed during the hospital encounter of 03/14/21  Blood Culture ID Panel (Reflexed) (Collected: 03/14/2021  6:36 AM)  Result Value Ref Range   Enterococcus faecalis NOT DETECTED NOT DETECTED   Enterococcus Faecium NOT DETECTED NOT DETECTED   Listeria monocytogenes NOT DETECTED NOT DETECTED   Staphylococcus species DETECTED (A) NOT DETECTED   Staphylococcus aureus (BCID) DETECTED (A) NOT DETECTED   Staphylococcus epidermidis NOT DETECTED NOT DETECTED   Staphylococcus lugdunensis NOT DETECTED NOT DETECTED   Streptococcus species NOT DETECTED NOT DETECTED   Streptococcus agalactiae NOT DETECTED NOT DETECTED   Streptococcus pneumoniae NOT DETECTED NOT DETECTED   Streptococcus pyogenes NOT DETECTED NOT DETECTED   A.calcoaceticus-baumannii NOT DETECTED NOT DETECTED   Bacteroides fragilis NOT DETECTED NOT DETECTED   Enterobacterales NOT DETECTED NOT DETECTED   Enterobacter cloacae complex NOT DETECTED NOT DETECTED   Escherichia coli NOT DETECTED NOT DETECTED   Klebsiella aerogenes NOT DETECTED NOT DETECTED   Klebsiella oxytoca NOT DETECTED NOT DETECTED   Klebsiella pneumoniae NOT DETECTED NOT DETECTED   Proteus species NOT DETECTED NOT DETECTED   Salmonella species NOT DETECTED NOT DETECTED   Serratia marcescens NOT DETECTED NOT DETECTED   Haemophilus influenzae  NOT DETECTED NOT DETECTED   Neisseria meningitidis NOT DETECTED NOT DETECTED   Pseudomonas aeruginosa NOT DETECTED NOT DETECTED   Stenotrophomonas maltophilia NOT DETECTED NOT DETECTED   Candida albicans NOT DETECTED NOT DETECTED   Candida auris NOT DETECTED NOT DETECTED   Candida glabrata NOT DETECTED NOT DETECTED   Candida krusei NOT DETECTED NOT DETECTED   Candida parapsilosis NOT DETECTED NOT DETECTED   Candida tropicalis NOT DETECTED NOT DETECTED   Cryptococcus neoformans/gattii NOT DETECTED NOT DETECTED   Meth resistant mecA/C and MREJ DETECTED (A) NOT DETECTED    Narda Bonds 03/15/2021  5:33 AM

## 2021-03-15 NOTE — Consult Note (Signed)
[facility] for Infectious Disease    Date of Admission:  03/14/2021     Reason for Consult: MRSA Bacteremia     Referring Physician: Garwin Brothers consult  Current antibiotics: Amp Sulbactam 4/23-present Vancomycin 4/24-present   ASSESSMENT:    1. MRSA bacteremia: Presented with severe sepsis with fever, tachycardia, lactic acidosis.  Most likely secondary to acute parotiditis seen on CT scan.  Patient also with lower extremity wounds and right heel pressure ulcer, however, feel that this is less likely to be the source of her bacteremia. 2. Acute parotiditis: Complicated by MRSA bacteremia as noted above 3. Lower extremity wounds  PLAN:    . Continue vancomycin dosed per pharmacy . Will discontinue ampicillin sulbactam  . Will add anaerobic coverage in the setting of parotiditis . TTE pending.  Would likely not pursue TEE given her goals of care and unlikely to significantly change management . Repeat blood cultures 48 hrs after starting appropriate antibiotics . Wound care for lower extremities . Will follow.  Dr Gale Journey or [place] will be back tomorrow   Principal Problem:   Acute parotitis Active Problems:   Essential hypertension   Atrial flutter with rapid ventricular response (HCC)   Goals of care, counseling/discussion   Adrenal insufficiency (Addison's disease) (HCC)   Functional quadriplegia (HCC)   Sepsis ([place])   MEDICATIONS:    Scheduled Meds: . Chlorhexidine Gluconate Cloth  6 each Topical Q0600  . digoxin  0.0625 mg Oral Daily  . escitalopram  10 mg Oral Daily  . feeding supplement  237 mL Oral BID BM  . hydrocortisone sod succinate (SOLU-CORTEF) inj  50 mg Intravenous Q6H  . hydrOXYzine  25 mg Oral BID  . mupirocin ointment  1 application Nasal BID  . Rivaroxaban  15 mg Oral QHS  . sodium chloride flush  3 mL Intravenous Q12H   Continuous Infusions: . ampicillin-sulbactam (UNASYN) IV 3 g (03/15/21 0933)  . diltiazem (CARDIZEM) infusion 5 mg/hr  (03/15/21 0936)  . [START ON 03/16/2021] vancomycin     PRN Meds:.acetaminophen **OR** acetaminophen, HYDROmorphone (DILAUDID) injection, ondansetron **OR** ondansetron (ZOFRAN) IV, oxyCODONE  HPI:    Michele Green is a 80 y.o. 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  She underwent CT scan which showed acute parotiditis on the right with no abscess or obstructing process.  She was found to be febrile to 100.7 with leukocytosis, tachycardia, and borderline blood pressure.  Other lab work included lactic acid 3.2 (now normalized), creatinine 1.12 (previously 0.8), normal LFTs, glucose 136, respiratory pathogen panel negative.  She also had a chest x-ray which after review showed mild cardiomegaly but no active cardiopulmonary process.  She was transferred to [facility] due to sepsis.  She received vancomycin and cefepime in the ED and was transitioned to ampicillin sulbactam upon transfer.  Overnight, she reports improvement in her right-sided facial pain and swelling.  She was also noted to have bilateral lower extremity wounds that she reports developed after recent fall last week.  Additionally, she has a pressure ulcer on her heel.  She has expressed that her goals of care are centered around comfort and she does not desire  invasive procedures/work up.    Past Medical History:  Diagnosis Date  . Arrhythmia   . Atypical atrial flutter ([place]) 08/14/2018   Post-operative  . Breast cancer of upper-outer quadrant of left female breast ([place]) 02/02/2016  . CAD (coronary artery  disease) 06/20/2018   LHC 7/19: pLAD 65/90, oD1 90, mLCx 85, OM2 50, oRCA 30, EF 50-55 >> s/p CABG in 9/19 (L-LAD)  . Colon polyp 02/2012  . Dupuytren contracture    bilateral hands  . Family history of breast cancer   . Hyperlipidemia   . Hypertension   . Mitral regurgitation 11/07/2013   Echo 7/19: Mild LVH, EF 60-65, no RWMA, mod ot severe MR, massive LAE, PASP 33 // TEE 7/19:  Mild conc LVH, EF 60-65, no RWMA, severe MR with mild post leaflet prolapse, massive // s/p MV repair 07/2018  . On continuous oral anticoagulation 08/22/2015   Started on Xarelto 08/12/2015   . Osteopenia   . Persistent atrial fibrillation ([place]) 08/22/2015   Started late August or early September 2016 // s/p Maze procedure 07/2018  . Personal history of radiation therapy   . Radiation Therapy 04/21/16-05/19/16   left breast 47.72 Gy, boosted to 10 Gy  . 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 S/P Maze operation for atrial fibrillation 08/10/2018   Complete bilateral atrial lesion set using bipolar radiofrequency and cryothermy ablation with clipping of LA appendage  . S/P MVR (mitral valve repair) 08/10/2018   Complex valvuloplasty including Gore-tex neochord placement x8, Plication of Lateral Commissure and 53m Sorin Memo 4D Ring Annuloplasty SN# [id number] . Ulcerative colitis ([place]   . Wears glasses     Social History   Tobacco Use  . Smoking status: Former Smoker    Packs/day: 1.00    Years: 30.00    Pack years: 30.00    Types: Cigarettes    Quit date: 11/22/1992    Years since quitting: 28.3  . Smokeless tobacco: Never Used  Vaping Use  . Vaping Use: Never used  Substance Use Topics  . Alcohol use: Yes    Alcohol/week: 3.0 standard drinks    Types: 3 Standard drinks or equivalent per week    Comment: social  . Drug use: No    Family History  Problem Relation Age of Onset  . Cirrhosis Mother   . Breast cancer Mother 732 . Heart failure Father   . Heart attack Father   . Breast cancer Sister         early 747's . Kidney Stones Sister        loss of kidney due to stones  . Osteoporosis Neg Hx     Allergies  Allergen Reactions  . Shellfish Allergy Itching  . Amoxicillin-Pot Clavulanate Diarrhea    Review of Systems  Constitutional: Positive for fever.  HENT:       Neck pain and swelling.  Eyes: Negative.   Respiratory: Negative.   Cardiovascular: Negative.   Gastrointestinal: Negative.   Genitourinary: Negative.   Skin:       Lower extremity wounds    OBJECTIVE:   Blood pressure (!) 99/59, pulse (!) 108, temperature 98.2 F (36.8 C), temperature source Oral, resp. rate 16, height 5' 3"  (1.6 m), weight 49 kg, SpO2 94 %. Body mass index is 19.13 kg/m.  Physical Exam Constitutional:      General: She is not in acute distress.    Appearance: Normal appearance.  HENT:  Head: Normocephalic and atraumatic.     Comments: Right sided facial swelling, erythema, and warmth noted. She has some mild tenderness.  Cardiovascular:     Rate and Rhythm: Normal rate and regular rhythm.     Heart sounds: No murmur heard.   Pulmonary:     Effort: Pulmonary effort is normal. No respiratory distress.     Breath sounds: Normal breath sounds.  Abdominal:     Palpations: Abdomen is soft.     Tenderness: There is no abdominal tenderness. There is no guarding or rebound.  Musculoskeletal:        General: Signs of injury present.  Skin:    Findings: Bruising present.  Neurological:     General: No focal deficit present.     Mental Status: She is alert and oriented to person, place, and time.  Psychiatric:        Mood and Affect: Mood normal.        Behavior: Behavior normal.          Lab Results: Lab Results  Component Value Date   WBC 24.9 (H) 03/15/2021   HGB 10.1 (L) 03/15/2021   HCT 30.5 (L) 03/15/2021   MCV 91.9 03/15/2021   PLT 153 03/15/2021    Lab Results  Component Value Date   NA 134 (L) 03/15/2021   K 3.6 03/15/2021   CO2 23 03/15/2021    GLUCOSE 116 (H) 03/15/2021   BUN 16 03/15/2021   CREATININE 1.01 (H) 03/15/2021   CALCIUM 7.3 (L) 03/15/2021   GFRNONAA 56 (L) 03/15/2021   GFRAA >60 08/20/2020    Lab Results  Component Value Date   ALT 11 03/14/2021   AST 16 03/14/2021   ALKPHOS 58 03/14/2021   BILITOT 1.1 03/14/2021       Component Value Date/Time   CRP 6.6 (H) 12/03/2020 0440    No results found for: ESRSEDRATE  I have reviewed the micro and lab results in Epic.  Imaging: CT Soft Tissue Neck W Contrast  Result Date: 03/14/2021 CLINICAL DATA:  Neck abscess EXAM: CT NECK WITH CONTRAST TECHNIQUE: Multidetector CT imaging of the neck was performed using the standard protocol following the bolus administration of intravenous contrast. CONTRAST:  69m OMNIPAQUE IOHEXOL 300 MG/ML  SOLN COMPARISON:  None. FINDINGS: Pharynx and larynx: Submucosal edema the level of the pharynx and possibly supraglottic larynx which is considered secondary. Salivary glands: Enlargement and hyperenhancement of the right parotid gland. No visible stone or abscess. Thyroid: Negative Lymph nodes: None enlarged or abnormal density. Vascular: Heavily calcified carotid bifurcations. No acute vascular finding Limited intracranial: Negative Visualized orbits: Degraded by motion artifact. Bilateral cataract resection. Mastoids and visualized paranasal sinuses: Chronic left sphenoid sinusitis with complete opacification. Milder right sphenoid opacification. Skeleton: C5-6 and C6-7 advanced disc degeneration. Upper chest: No acute finding IMPRESSION: Acute parotiditis on the right. No abscess or obstructing process. Secondary inflammation involves the right neck and pharynx. Electronically Signed   By: JMonte FantasiaM.D.   On: 03/14/2021 08:36   DG Chest Port 1 View  Result Date: 03/14/2021 CLINICAL DATA:  Right facial swelling and tenderness. Possible sepsis. EXAM: PORTABLE CHEST 1 VIEW COMPARISON:  01/24/2021 FINDINGS: Stable mild cardiomegaly.  Aortic atherosclerotic calcification noted. Previous median sternotomy and mitral valve replacement. No evidence of pulmonary infiltrate or edema. No evidence of pleural effusion. IMPRESSION: Mild cardiomegaly.  No active lung disease. Electronically Signed   By: JMarlaine HindM.D.   On: 03/14/2021 08:31     Imaging  independently reviewed in Epic.  [facility] for [facility]ious [facility] [phone] pager 03/15/2021, 10:51 AM

## 2021-03-16 DIAGNOSIS — A419 Sepsis, unspecified organism: Secondary | ICD-10-CM

## 2021-03-16 DIAGNOSIS — G8250 Quadriplegia, unspecified: Secondary | ICD-10-CM

## 2021-03-16 DIAGNOSIS — E272 Addisonian crisis: Secondary | ICD-10-CM

## 2021-03-16 LAB — C DIFFICILE QUICK SCREEN W PCR REFLEX
C Diff antigen: NEGATIVE
C Diff interpretation: NOT DETECTED
C Diff toxin: NEGATIVE

## 2021-03-16 MED ORDER — METOPROLOL TARTRATE 12.5 MG HALF TABLET
12.5000 mg | ORAL_TABLET | Freq: Two times a day (BID) | ORAL | Status: DC
  Administered 2021-03-16 – 2021-03-20 (×9): 12.5 mg via ORAL
  Filled 2021-03-16 (×9): qty 1

## 2021-03-16 MED ORDER — SODIUM CHLORIDE 0.9 % IV SOLN: INTRAVENOUS | Status: AC

## 2021-03-16 MED ORDER — COLLAGENASE 250 UNIT/GM EX OINT
TOPICAL_OINTMENT | Freq: Every day | CUTANEOUS | Status: AC
  Filled 2021-03-16: qty 30

## 2021-03-16 MED ORDER — SODIUM CHLORIDE 0.9 % IV SOLN
8.0000 mg/kg | Freq: Every day | INTRAVENOUS | Status: DC
  Administered 2021-03-17: 400 mg via INTRAVENOUS
  Filled 2021-03-16 (×2): qty 8

## 2021-03-16 NOTE — NC FL2 (Signed)
[place] MEDICAID FL2 LEVEL OF CARE SCREENING TOOL     IDENTIFICATION  Patient Name: Michele Green Birthdate: [birth year] Sex: female Admission Date (Current Location): 03/14/2021  [facility] and [place] Number:  Herbalist and Address:  [facility], [place] [address], [place], [place] [address]      Provider Number: [id number]  Attending Physician Name and Address:  Kayleen Memos, DO  Relative Name and Phone Number:  [name] [phone]    Current Level of Care: Hospital Recommended Level of Care: [place] ([facility] with hospice services to follow) Prior Approval Number:    Date Approved/Denied:   PASRR Number:    Discharge Plan: Other (Comment) ([facility] with hospice services to follow)    Current Diagnoses: Patient Active Problem List   Diagnosis Date Noted  . MRSA bacteremia 03/15/2021  . Multiple open wounds of lower extremity 03/15/2021  . Acute parotitis 03/14/2021  . Sepsis ([place]) 03/14/2021  . Atrial tachycardia ([place])   . Adrenal insufficiency (Addison's disease) ([place]) 01/24/2021  . Anemia of chronic disease 01/24/2021  . Functional quadriplegia ([place]) 01/24/2021  . Hypotension 01/17/2021  . Elevated troponin 01/17/2021  . Debility 01/17/2021  . Chronic pain 01/17/2021  . Ulcerative colitis ([place]) 01/17/2021  . Chronic respiratory failure with hypoxia ([place]) 12/03/2020  . Pressure injury of skin 12/02/2020  . Pneumonia due to COVID-19 virus 12/01/2020  . T8 vertebral fracture ([place]) 09/13/2020  . Compression fracture of C-spine ([place]) 09/12/2020  . Compression fracture of thoracic spine, non-traumatic ([place]) 09/12/2020  . Goals of care, counseling/discussion   . Palliative care by specialist   . Spinal fracture of T7 vertebra ([place]) 08/17/2020  . Atrial fibrillation with RVR ([place]) 08/16/2020  . Long term (current) use of anticoagulants 08/28/2018  . Atrial flutter with rapid ventricular response  ([place]) 08/14/2018  . 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 S/P Maze operation for atrial fibrillation 08/10/2018  . Chronic diastolic HF (heart failure) ([place]) 06/20/2018  . Osteoporosis 09/29/2017  . Loosening of hardware in spine ([place]) 08/23/2017  . Spinal stenosis of lumbar region 08/11/2016  . Unspecified cord compression ([place]) 08/11/2016  . Unstable burst fracture of third lumbar vertebra ([place]) 08/11/2016  . Genetic testing 03/17/2016  . Family history of breast cancer   . Breast cancer of upper-outer quadrant of left female breast ([place]) 02/02/2016  . On amiodarone therapy 09/09/2015  . On continuous oral anticoagulation 08/22/2015  . Persistent atrial fibrillation ([place]) 08/22/2015  . Essential hypertension 11/07/2013  . Mitral regurgitation 11/07/2013    Orientation RESPIRATION BLADDER Height & Weight     Self  O2 (Nasal Cannula 2 liters) Incontinent Weight: 108 lb 11 oz (49.3 kg) Height:  5' 3"  (160 cm)  BEHAVIORAL SYMPTOMS/MOOD NEUROLOGICAL BOWEL NUTRITION STATUS      Incontinent Diet (See discharge summary)  AMBULATORY STATUS COMMUNICATION OF NEEDS Skin     Verbally Other (Comment) (PI Heel Right stage 3,wound/incision open or dehiced,skin tear, non-pressure wound pretibial,right,left)                       Personal Care Assistance Level of Assistance  Bathing,Feeding,Dressing Bathing Assistance: Maximum assistance Feeding assistance: Limited assistance Dressing Assistance: Maximum assistance     Functional Limitations Info  Sight,Hearing,Speech Sight Info: Impaired Hearing Info: Adequate Speech Info: Adequate    SPECIAL CARE FACTORS FREQUENCY  PT (By licensed PT),OT (By licensed  OT)     PT Frequency: 3x min weekly OT Frequency: 3x min weekly            Contractures Contractures Info: Not present    Additional Factors Info  Code Status,Allergies,Psychotropic Code Status Info: DNR Allergies Info: Shellfish  Allergy,Amoxicillin-pot Clavulanate Psychotropic Info: escitalopram (LEXAPRO) tablet 10 mg daily         Current Medications (03/16/2021):  This is the current hospital active medication list Current Facility-Administered Medications  Medication Dose Route Frequency Provider Last Rate Last Admin  . 0.9 %  sodium chloride infusion   Intravenous Continuous Irene Pap N, DO      . acetaminophen (TYLENOL) tablet 650 mg  650 mg Oral Q6H PRN Karmen Bongo, MD       Or  . acetaminophen (TYLENOL) suppository 650 mg  650 mg Rectal Q6H PRN Karmen Bongo, MD      . Chlorhexidine Gluconate Cloth 2 % PADS 6 each  6 each Topical Q0600 Edwin Dada, MD   6 each at 03/15/21 0931  . clonazePAM (KLONOPIN) tablet 0.5 mg  0.5 mg Oral BID PRN Kayleen Memos, DO      . [START ON 03/17/2021] DAPTOmycin (CUBICIN) 400 mg in sodium chloride 0.9 % IVPB  8 mg/kg Intravenous Q2000 Rosiland Oz, MD      . digoxin (LANOXIN) tablet 0.0625 mg  0.0625 mg Oral Daily Karmen Bongo, MD   0.0625 mg at 03/15/21 1346  . diltiazem (CARDIZEM CD) 24 hr capsule 120 mg  120 mg Oral Daily Irene Pap N, DO   120 mg at 03/15/21 1817  . diltiazem (CARDIZEM) 125 mg in dextrose 5% 125 mL (1 mg/mL) infusion  5-15 mg/hr Intravenous Titrated Karmen Bongo, MD   Stopped at 03/15/21 1819  . escitalopram (LEXAPRO) tablet 10 mg  10 mg Oral Daily Karmen Bongo, MD   10 mg at 03/15/21 0929  . feeding supplement (ENSURE ENLIVE / ENSURE PLUS) liquid 237 mL  237 mL Oral BID BM Karmen Bongo, MD   237 mL at 03/15/21 0929  . hydrocortisone sodium succinate (SOLU-CORTEF) 100 MG injection 50 mg  50 mg Intravenous Q6H Karmen Bongo, MD   50 mg at 03/16/21 0524  . HYDROmorphone (DILAUDID) injection 0.5 mg  0.5 mg Intravenous Q2H PRN Karmen Bongo, MD      . hydrOXYzine (ATARAX/VISTARIL) tablet 25 mg  25 mg Oral BID Karmen Bongo, MD   25 mg at 03/15/21 2035  . Mesalamine (ASACOL) DR capsule 400 mg  400 mg Oral Daily Irene Pap N, DO      . metoprolol tartrate (LOPRESSOR) tablet 12.5 mg  12.5 mg Oral BID Hall, Carole N, DO      . metroNIDAZOLE (FLAGYL) tablet 500 mg  500 mg Oral Q8H Jule Ser N, DO   500 mg at 03/16/21 0524  . mupirocin ointment (BACTROBAN) 2 % 1 application  1 application Nasal BID Edwin Dada, MD   1 application at 67/67/20 0929  . ondansetron (ZOFRAN) tablet 4 mg  4 mg Oral Q6H PRN Karmen Bongo, MD       Or  . ondansetron [facility]) injection 4 mg  4 mg Intravenous Q6H PRN Karmen Bongo, MD      . oxyCODONE (Oxy IR/ROXICODONE) immediate release tablet 5 mg  5 mg Oral Q4H PRN Karmen Bongo, MD   5 mg at 03/15/21 0929  . Rivaroxaban (XARELTO) tablet 15 mg  15 mg Oral QHS Karmen Bongo, MD   15  mg at 03/15/21 2035  . sodium chloride flush (NS) 0.9 % injection 3 mL  3 mL Intravenous Q12H Karmen Bongo, MD   3 mL at 03/15/21 2036     Discharge Medications: Please see discharge summary for a list of discharge medications.  Relevant Imaging Results:  Relevant Lab Results:   Additional Information [id number]  Trula Ore, LCSWA

## 2021-03-16 NOTE — Progress Notes (Signed)
Palliative Consult received.  Patient is currently active with [facility] and they are following here in the hospital.    Discussed with Dr. Nevada Crane and with [facility].  Goals are clear.   We are not needed at this point.    We have seen Michele Green in the past.  She is a lovely woman.   If there is any need for our services please do not hesitate to reconsult.  We would be happy to help in any way possible.  Florentina Jenny, PA-C Palliative Medicine Office:  [phone]

## 2021-03-16 NOTE — Progress Notes (Signed)
[facility] Infectious Diseases Follow Up Note  Patient Identification: Patient Name: Michele Green MRN: [id number] [place] Date: 03/14/2021  6:13 AM Age: 80 y.o.Today's Date: 03/16/2021   Reason for Visit: Follow-up on MRSA bacteremia  Principal Problem:   MRSA bacteremia Active Problems:   Essential hypertension   Atrial flutter with rapid ventricular response (HCC)   Goals of care, counseling/discussion   Adrenal insufficiency (Addison's disease) ([place])   Functional quadriplegia ([place])   Acute parotitis   Sepsis ([place])   Multiple open wounds of lower extremity   Antibiotics: Ampicillin-sulbactam 4/20 3-4/24                    Cefepime 4/23                    Metronidazole 4/24                    Vancomycin 4/23-current  Lines/Tubes: PIV's,   Interval Events: Afebrile, leukocytosis is downtrending, hemodynamically stable   Assessment MRSA bacteremia, community-acquired, both sets TTE with no vegetation, 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  We will possibly change vancomycin to daptomycin  Right heel stage  Ulcer - woc following  Loose stool -follow-up C. Difficile  On Hospice care   Recommendations Continue vancomycin, pharmacy to dose.  TTE with no vegetations.  It seems TEE will likely be not done given her goals of care and hence, she would need to be treated for endocarditis empirically with 6 weeks of antibiotics from date of negative blood cultures Follow up repeat blood cultures from 4/25 Wound care for right heel ulcer Monitor CBC, BMP and vancomycin trough while on IV antibiotics Follow-up C. Difficile, on enteric precautions Following   Rest of the management as per the primary team. Thank you for the consult. Please page with pertinent questions or  concerns.  ______________________________________________________________________ Subjective patient seen and examined at the bedside.  Denies any fevers chills.  Denies any nausea vomiting.  Complains of watery stool since yesterday.  She wants to talk with her family  Vitals BP 102/63 (BP Location: Left Arm)   Pulse (!) 122   Temp 98.5 F (36.9 C) (Oral)   Resp 18   Ht 5' 3"  (1.6 m)   Wt 49.3 kg   SpO2 96%   BMI 19.25 kg/m     Physical Exam Constitutional:   Lying in bed, no acute distress    Comments:   Cardiovascular:     Rate and Rhythm: Normal rate and regular rhythm.     Heart sounds: Tachycardic  Pulmonary:     Effort: Pulmonary effort is normal.     Comments:   Abdominal:     Palpations: Abdomen is soft.     Tenderness: Nontender, bowel sounds present  Musculoskeletal:        General: No swelling or tenderness.   Skin:    Comments: Right heel stage III ulcer, right and left leg superficial open wound  Neurological:     General: No focal deficit present.   Psychiatric:        Mood and Affect: Mood normal.   Pertinent Microbiology Results for orders placed or performed during the hospital encounter of 03/14/21  Resp Panel by RT-PCR (Flu A&B, Covid) Nasopharyngeal Swab     Status: None   Collection Time: 03/14/21  6:31 AM  Specimen: Nasopharyngeal Swab; Nasopharyngeal(NP) swabs in vial transport medium  Result Value Ref Range Status   SARS Coronavirus 2 by RT PCR NEGATIVE NEGATIVE Final    Comment: (NOTE) SARS-CoV-2 target nucleic acids are NOT DETECTED.  The SARS-CoV-2 RNA is generally detectable in upper respiratory specimens during the acute phase of infection. The lowest concentration of SARS-CoV-2 viral copies this assay can detect is 138 copies/mL. A negative result does not preclude SARS-Cov-2 infection and should not be used as the sole basis for treatment or other patient management decisions. A negative result may occur with  improper  specimen collection/handling, submission of specimen other than nasopharyngeal swab, presence of viral mutation(s) within the areas targeted by this assay, and inadequate number of viral copies(<138 copies/mL). A negative result must be combined with clinical observations, patient history, and epidemiological information. The expected result is Negative.  Fact Sheet for Patients:  EntrepreneurPulse.com.au  Fact Sheet for Healthcare Providers:  IncredibleEmployment.be  This test is no t yet approved or cleared by the [place] FDA and  has been authorized for detection and/or diagnosis of SARS-CoV-2 by FDA under an Emergency Use Authorization (EUA). This EUA will remain  in effect (meaning this test can be used) for the duration of the COVID-19 declaration under Section 564(b)(1) of the Act, 21 U.S.C.section 360bbb-3(b)(1), unless the authorization is terminated  or revoked sooner.       Influenza A by PCR NEGATIVE NEGATIVE Final   Influenza B by PCR NEGATIVE NEGATIVE Final    Comment: (NOTE) The Xpert Xpress SARS-CoV-2/FLU/RSV plus assay is intended as an aid in the diagnosis of influenza from Nasopharyngeal swab specimens and should not be used as a sole basis for treatment. Nasal washings and aspirates are unacceptable for Xpert Xpress SARS-CoV-2/FLU/RSV testing.  Fact Sheet for Patients: EntrepreneurPulse.com.au  Fact Sheet for Healthcare Providers: IncredibleEmployment.be  This test is not yet approved or cleared by the [place] FDA and has been authorized for detection and/or diagnosis of SARS-CoV-2 by FDA under an Emergency Use Authorization (EUA). This EUA will remain in effect (meaning this test can be used) for the duration of the COVID-19 declaration under Section 564(b)(1) of the Act, 21 U.S.C. section 360bbb-3(b)(1), unless the authorization is terminated or revoked.  Performed at  [facility]   Blood Culture (routine x 2)     Status: Abnormal (Preliminary result)   Collection Time: 03/14/21  6:31 AM   Specimen: BLOOD  Result Value Ref Range Status   Specimen Description   Final    BLOOD BLOOD LEFT FOREARM Performed at [facility]    Special Requests   Final    BOTTLES DRAWN AEROBIC AND ANAEROBIC Blood Culture adequate volume Performed at [facility]    Culture  Setup Time   Final    GRAM POSITIVE COCCI ANAEROBIC BOTTLE ONLY CRITICAL VALUE NOTED.  VALUE IS CONSISTENT WITH PREVIOUSLY REPORTED AND CALLED VALUE. Performed at [facility], [place] [address] [place], [place] [address]    Culture STAPHYLOCOCCUS AUREUS (A)  Final   Report Status PENDING  Incomplete  Group A Strep by PCR     Status: None   Collection Time: 03/14/21  6:31 AM   Specimen: Peripheral; Sterile Swab  Result Value Ref Range Status   Group A Strep by PCR NOT DETECTED NOT DETECTED Final    Comment: Performed at [facility]  Blood Culture (routine x 2)     Status: Abnormal (Preliminary  result)   Collection Time: 03/14/21  6:36 AM   Specimen: BLOOD  Result Value Ref Range Status   Specimen Description   Final    BLOOD BLOOD LEFT WRIST Performed at [facility]    Special Requests   Final    BOTTLES DRAWN AEROBIC AND ANAEROBIC Blood Culture adequate volume Performed at [facility]    Culture  Setup Time   Final    GRAM POSITIVE COCCI AEROBIC BOTTLE ONLY CRITICAL RESULT CALLED TO, READ BACK BY AND VERIFIED WITH: Beulah Gandy 03/15/2021 at [place]    Culture (A)  Final    STAPHYLOCOCCUS AUREUS SUSCEPTIBILITIES TO FOLLOW Performed at [facility], [place] [address] [place], [place] [address]    Report Status PENDING  Incomplete  Blood Culture ID Panel (Reflexed)     Status: Abnormal   Collection Time: 03/14/21  6:36 AM  Result Value Ref Range Status    Enterococcus faecalis NOT DETECTED NOT DETECTED Final   Enterococcus Faecium NOT DETECTED NOT DETECTED Final   Listeria monocytogenes NOT DETECTED NOT DETECTED Final   Staphylococcus species DETECTED (A) NOT DETECTED Final    Comment: CRITICAL RESULT CALLED TO, READ BACK BY AND VERIFIED WITH: PHARMD JAMES LEDFORD 03/15/2021 AT 0503 A.HUGHES    Staphylococcus aureus (BCID) DETECTED (A) NOT DETECTED Final    Comment: Methicillin (oxacillin)-resistant Staphylococcus aureus (MRSA). MRSA is predictably resistant to beta-lactam antibiotics (except ceftaroline). Preferred therapy is vancomycin unless clinically contraindicated. Patient requires contact precautions if  hospitalized. CRITICAL RESULT CALLED TO, READ BACK BY AND VERIFIED WITH: PHARMD JAMES LEDFORD 03/15/2021 AT 0503 A.HUGHES    Staphylococcus epidermidis NOT DETECTED NOT DETECTED Final   Staphylococcus lugdunensis NOT DETECTED NOT DETECTED Final   Streptococcus species NOT DETECTED NOT DETECTED Final   Streptococcus agalactiae NOT DETECTED NOT DETECTED Final   Streptococcus pneumoniae NOT DETECTED NOT DETECTED Final   Streptococcus pyogenes NOT DETECTED NOT DETECTED Final   A.calcoaceticus-baumannii NOT DETECTED NOT DETECTED Final   Bacteroides fragilis NOT DETECTED NOT DETECTED Final   Enterobacterales NOT DETECTED NOT DETECTED Final   Enterobacter cloacae complex NOT DETECTED NOT DETECTED Final   Escherichia coli NOT DETECTED NOT DETECTED Final   Klebsiella aerogenes NOT DETECTED NOT DETECTED Final   Klebsiella oxytoca NOT DETECTED NOT DETECTED Final   Klebsiella pneumoniae NOT DETECTED NOT DETECTED Final   Proteus species NOT DETECTED NOT DETECTED Final   Salmonella species NOT DETECTED NOT DETECTED Final   Serratia marcescens NOT DETECTED NOT DETECTED Final   Haemophilus influenzae NOT DETECTED NOT DETECTED Final   Neisseria meningitidis NOT DETECTED NOT DETECTED Final   Pseudomonas aeruginosa NOT DETECTED NOT DETECTED Final    Stenotrophomonas maltophilia NOT DETECTED NOT DETECTED Final   Candida albicans NOT DETECTED NOT DETECTED Final   Candida auris NOT DETECTED NOT DETECTED Final   Candida glabrata NOT DETECTED NOT DETECTED Final   Candida krusei NOT DETECTED NOT DETECTED Final   Candida parapsilosis NOT DETECTED NOT DETECTED Final   Candida tropicalis NOT DETECTED NOT DETECTED Final   Cryptococcus neoformans/gattii NOT DETECTED NOT DETECTED Final   Meth resistant mecA/C and MREJ DETECTED (A) NOT DETECTED Final    Comment: CRITICAL RESULT CALLED TO, READ BACK BY AND VERIFIED WITH: PHARMD JAMES LEDFORD 03/15/2021 AT 0503 A.HUGHES Performed at [facility], [place] [address] [place], [place] [address]   Urine culture     Status: None   Collection Time: 03/14/21 10:44 AM   Specimen:  In/Out Cath Urine  Result Value Ref Range Status   Specimen Description   Final    IN/OUT CATH URINE Performed at [facility]    Special Requests NONE Performed at [facility]   Final   Culture   Final    NO GROWTH Performed at [facility], [address], [place], [place] [address]    Report Status 03/15/2021 FINAL  Final  MRSA PCR Screening     Status: Abnormal   Collection Time: 03/14/21  6:00 PM   Specimen: Nasopharyngeal  Result Value Ref Range Status   MRSA by PCR POSITIVE (A) NEGATIVE Final    Comment:        The GeneXpert MRSA Assay (FDA approved for NASAL specimens only), is one component of a comprehensive MRSA colonization surveillance program. It is not intended to diagnose MRSA infection nor to guide or monitor treatment for MRSA infections. RESULT CALLED TO, READ BACK BY AND VERIFIED WITH: K,HOLDEN RN @2022  03/14/21 EB Performed at [facility] [address], [place], [place] [address]   Respiratory (~20 pathogens) panel by PCR     Status: None   Collection Time: 03/14/21  6:30 PM  Result Value Ref Range Status   Adenovirus NOT DETECTED  NOT DETECTED Final   Coronavirus 229E NOT DETECTED NOT DETECTED Final    Comment: (NOTE) The Coronavirus on the Respiratory Panel, DOES NOT test for the novel  Coronavirus (2019 nCoV)    Coronavirus HKU1 NOT DETECTED NOT DETECTED Final   Coronavirus NL63 NOT DETECTED NOT DETECTED Final   Coronavirus OC43 NOT DETECTED NOT DETECTED Final   Metapneumovirus NOT DETECTED NOT DETECTED Final   Rhinovirus / Enterovirus NOT DETECTED NOT DETECTED Final   Influenza A NOT DETECTED NOT DETECTED Final   Influenza B NOT DETECTED NOT DETECTED Final   Parainfluenza Virus 1 NOT DETECTED NOT DETECTED Final   Parainfluenza Virus 2 NOT DETECTED NOT DETECTED Final   Parainfluenza Virus 3 NOT DETECTED NOT DETECTED Final   Parainfluenza Virus 4 NOT DETECTED NOT DETECTED Final   Respiratory Syncytial Virus NOT DETECTED NOT DETECTED Final   Bordetella pertussis NOT DETECTED NOT DETECTED Final   Bordetella Parapertussis NOT DETECTED NOT DETECTED Final   Chlamydophila pneumoniae NOT DETECTED NOT DETECTED Final   Mycoplasma pneumoniae NOT DETECTED NOT DETECTED Final    Comment: Performed at [facility], [place]. [address], [place], [place] [address]    Pertinent Lab. CBC Latest Ref Rng & Units 03/15/2021 03/14/2021 01/30/2021  WBC 4.0 - 10.5 K/uL 24.9(H) 33.4(H) 8.6  Hemoglobin 12.0 - 15.0 g/dL 10.1(L) 12.9 10.1(L)  Hematocrit 36.0 - 46.0 % 30.5(L) 38.5 31.4(L)  Platelets 150 - 400 K/uL 153 219 111(L)   CMP Latest Ref Rng & Units 03/15/2021 03/14/2021 02/02/2021  Glucose 70 - 99 mg/dL 116(H) 136(H) 136(H)  BUN 8 - 23 mg/dL 16 16 24(H)  Creatinine 0.44 - 1.00 mg/dL 1.01(H) 1.12(H) 0.86  Sodium 135 - 145 mmol/L 134(L) 136 137  Potassium 3.5 - 5.1 mmol/L 3.6 4.3 3.5  Chloride 98 - 111 mmol/L 103 96(L) 90(L)  CO2 22 - 32 mmol/L 23 20(L) 40(H)  Calcium 8.9 - 10.3 mg/dL 7.3(L) 8.7(L) 8.3(L)  Total Protein 6.5 - 8.1 g/dL - 6.3(L) -  Total Bilirubin 0.3 - 1.2 mg/dL - 1.1 -  Alkaline Phos 38 - 126 U/L - 58 -  AST  15 - 41 U/L - 16 -  ALT 0 - 44  U/L - 11 -     Pertinent Imaging today Plain films and CT images have been personally visualized and interpreted; radiology reports have been reviewed. Decision making incorporated into the Impression / Recommendations.  TTE 03/15/21 IMPRESSIONS  1. Left ventricular ejection fraction, by estimation, is 60 to 65%. The  left ventricle has normal function. The left ventricle has no regional  wall motion abnormalities. Left ventricular diastolic parameters are  indeterminate.  2. Right ventricular systolic function is normal. The right ventricular  size is normal.  3. Left atrial size was severely dilated.  4. The mitral valve has been repaired/replaced. Mild mitral valve  regurgitation. Mild mitral stenosis. There is a prosthetic annuloplasty  ring present in the mitral position.  5. Tricuspid valve regurgitation is mild to moderate.  6. The aortic valve is tricuspid. Aortic valve regurgitation is not  visualized. No aortic stenosis is present.    I have spent approx 30 minutes for this patient encounter including review of prior medical records, coordination of care  with greater than 50% of time being face to face/counseling and discussing diagnostics/treatment plan with the patient/family.  Electronically signed by:   Rosiland Oz, MD Infectious Disease Physician [facility] for Infectious Disease Pager: [phone]

## 2021-03-16 NOTE — TOC Initial Note (Signed)
Transition of Care [facility]) - Initial/Assessment Note    Patient Details  Name: Michele Green MRN: [id number] Date of Birth: 29-Dec-1940  Transition of Care [facility]) CM/SW Contact:    Michele Green, [place] Phone Number: 03/16/2021, 11:44 AM  Clinical Narrative:                   CSW spoke with patients son Michele Green who confirmed that patient comes from [facility] with hospice services following. Patients son confirmed that plan at dc is to return to [facility] with hospice services to follow. CSW spoke with Michele Green and Michele Green with [facility] who confirmed patient is hospice with [facility]. CSW called [facility] and spoke with Michele Green who confirmed patient is from [place]. When patient is ready for dc ALF will need DC summary,FL2, and covid result. CSW will need to fax over to 100 hall nurse/med tech at [phone].No further questions reported at this time. CSW to continue to follow and assist with discharge planning needs.  Expected Discharge Plan: Assisted Living (back to ALF [facility] with hospice services to follow) Barriers to Discharge: Continued Medical Work up   Patient Goals and CMS Choice   CMS Medicare.gov Compare Post Acute Care list provided to:: Patient Represenative (must comment) (patients son Michele Green) Choice offered to / list presented to : Adult Children Michele Green)  Expected Discharge Plan and Services Expected Discharge Plan: Assisted Living (back to ALF [facility] with hospice services to follow) In-house Referral: Clinical Social Work     Living arrangements for the past 2 months: [place] ([facility])                                      Prior Living Arrangements/Services Living arrangements for the past 2 months: [place] ([facility]) Lives with:: Self,Facility Resident ([facility]) Patient language and need for interpreter reviewed:: Yes Do you feel safe going  back to the place where you live?: Yes      Need for Family Participation in Patient Care: Yes (Comment) Care giver support system in place?: Yes (comment)   Criminal Activity/Legal Involvement Pertinent to Current Situation/Hospitalization: No - Comment as needed  Activities of Daily Living Home Assistive Devices/Equipment: Oxygen ADL Screening (condition at time of admission) Patient's cognitive ability adequate to safely complete daily activities?: Yes Is the patient deaf or have difficulty hearing?: No Does the patient have difficulty seeing, even when wearing glasses/contacts?: No Does the patient have difficulty concentrating, remembering, or making decisions?: No Patient able to express need for assistance with ADLs?: No Does the patient have difficulty dressing or bathing?: No Independently performs ADLs?: Yes (appropriate for developmental age) Does the patient have difficulty walking or climbing stairs?: Yes Weakness of Legs: Both Weakness of Arms/Hands: None  Permission Sought/Granted Permission sought to share information with : Case Manager,Family Chief Financial Officer Permission granted to share information with :  (patient disoriented x3)  Share Information with NAME: Michele Green  Permission granted to share info w AGENCY: ALF  Permission granted to share info w Relationship: Michele Green  Permission granted to share info w Contact Information: Michele Green [phone]  Emotional Assessment       Orientation: :  (disoriented x3) Alcohol / Substance Use: Not Applicable Psych Involvement: No (comment)  Admission diagnosis:  Addison's disease ([place]) [E27.1] Parotiditis [K11.20] Chronic anticoagulation [Z79.01] Neck infection [L08.9]  Atrial flutter with rapid ventricular response (HCC) [I48.92] Acute parotitis [K11.21] Sepsis due to undetermined organism ([place]) [A41.9] Leukocytosis, unspecified type [D72.829] Patient Active Problem List   Diagnosis Date Noted   . MRSA bacteremia 03/15/2021  . Multiple open wounds of lower extremity 03/15/2021  . Acute parotitis 03/14/2021  . Sepsis ([place]) 03/14/2021  . Atrial tachycardia ([place])   . Adrenal insufficiency (Addison's disease) ([place]) 01/24/2021  . Anemia of chronic disease 01/24/2021  . Functional quadriplegia ([place]) 01/24/2021  . Hypotension 01/17/2021  . Elevated troponin 01/17/2021  . Debility 01/17/2021  . Chronic pain 01/17/2021  . Ulcerative colitis ([place]) 01/17/2021  . Chronic respiratory failure with hypoxia ([place]) 12/03/2020  . Pressure injury of skin 12/02/2020  . Pneumonia due to COVID-19 virus 12/01/2020  . T8 vertebral fracture ([place]) 09/13/2020  . Compression fracture of C-spine ([place]) 09/12/2020  . Compression fracture of thoracic spine, non-traumatic ([place]) 09/12/2020  . Goals of care, counseling/discussion   . Palliative care by specialist   . Spinal fracture of T7 vertebra ([place]) 08/17/2020  . Atrial fibrillation with RVR ([place]) 08/16/2020  . Long term (current) use of anticoagulants 08/28/2018  . Atrial flutter with rapid ventricular response ([place]) 08/14/2018  . 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 S/P Maze operation for atrial fibrillation 08/10/2018  . Chronic diastolic HF (heart failure) ([place]) 06/20/2018  . Osteoporosis 09/29/2017  . Loosening of hardware in spine ([place]) 08/23/2017  . Spinal stenosis of lumbar region 08/11/2016  . Unspecified cord compression ([place]) 08/11/2016  . Unstable burst fracture of third lumbar vertebra ([place]) 08/11/2016  . Genetic testing 03/17/2016  . Family history of breast cancer   . Breast cancer of upper-outer quadrant of left female breast ([place]) 02/02/2016  . On amiodarone therapy 09/09/2015  . On continuous oral anticoagulation 08/22/2015  . Persistent atrial fibrillation ([place]) 08/22/2015  . Essential hypertension 11/07/2013  . Mitral regurgitation 11/07/2013   PCP:  Michele Neer, MD Pharmacy:  No Pharmacies  Listed    Social Determinants of Health (SDOH) Interventions    Readmission Risk Interventions Readmission Risk Prevention Plan 01/19/2021  Medication Review ([place]) Complete  PCP or Specialist appointment within 3-5 days of discharge Complete  HRI or Home Care Consult Complete  SW Recovery Care/Counseling Consult Complete  Palliative Care Screening Complete  Skilled Nursing Facility Complete  Some recent data might be hidden

## 2021-03-16 NOTE — Progress Notes (Signed)
PROGRESS NOTE  [name] LTR:[id number] DOB: 1941-05-02 DOA: 03/14/2021 PCP: [name], MD  HPI/Recap of past 24 hours:  Michele Green is a 80 y.o. female with medical history significant of UC; paroxysmal A. fib on Xarelto and digoxin s/p Maze; CAD s/p CABG; chronic diastolic dysfunction; Addison's disease; breast cancer; COVID in 11/2020; HTN; and HLD who presented to [facility] with neck pain. She was hospitalized in Jan 2022 for COVID PNA and afib with RVR.  She was then hospitalized in late February for afib with RVR and steroid-induced adrenal insufficiency.  She was admitted again on 3/5 for afib with RvR and transferred to [facility] with DCCV cardioversrion on 3/10.  She converted to NSR but reverted back to aflutter.  She obtained rate control with IV Digoxin and was discharged back to SNF on 3/14.  She developed R lateral face and neck pain and presented to [facility] where she was found to have SIRS criteria; code sepsis was activated.  She confirmed DNR; reports extreme fatigue; and voiced an interest in some minimal treatments to try to help her to improve but with a clear goal of comfort.   CT soft tissues neck with contrast done on 03/14/2021 revealed acute parotiditis on the right.  She was initially started on broad-spectrum IV antibiotic Unasyn.  Blood cultures came back positive for MRSA, was switched to IV vancomycin with oral Flagyl added by ID.   03/15/21: Seen at her bedside this morning.  She states she just wants to see.  She does not appear to be in distress.  Discussed her case with infectious disease, plan to discharge on linezolid x6 weeks after repeated blood cultures negative x48 hours.  She will need to have CBC every 2 weeks to monitor for thrombocytopenia.  Assessment/Plan: Principal Problem:   MRSA bacteremia Active Problems:   Essential hypertension   Atrial flutter with rapid ventricular response (HCC)   Goals of care, counseling/discussion   Adrenal insufficiency  (Addison's disease) ([place])   Functional quadriplegia (HCC)   Acute parotitis   Sepsis ([address])   Multiple open wounds of lower extremity  Sepsis due to acute parotiditis and MRSA bacteremia Presented with leukocytosis, fever, tachycardia, tachypnea Initially started on Unasyn on 03/14/2021, switched to IV vancomycin and p.o. Flagyl on 03/15/2021 due to MRSA bacteremia. 2D echo results are pending. Repeat blood cultures x2 peripherally on 03/16/2021. Infectious disease following, appreciate assistance.  MRSA bacteremia, possibly wound care source of infection. ID following Follow 2D echo results Repeat blood cultures x2 peripherally on 03/16/2021. Monitor fever curve and WBC. Continue IV vancomycin as recommended by ID Continue to follow repeated culture, and a negative x48 hours can switch to linezolid for DC planning Plan is to discharge back to ALF with hospice care on linezolid x6 weeks.  Chronic A. Fib/flutter Continue diltiazem She is on home p.o. digoxin Obtain dig level Continue Xarelto for CVA prevention If evidence of endocarditis, DC Xarelto due to high risk of hemorrhagic conversion.  Leg wounds, present on admission. Continue local wound care  History of breast cancer She is no longer on tamoxifen Hospice patient  Essential hypertension/hyperlipidemia At home she was on p.o. Lasix 40 mg daily, p.o. diltiazem to 40 mg daily, digoxin 62.5 mcg daily Monitor vital signs She is currently not on any statin drugs  Ulcerative colitis Stable Continue home mesalamine or substitute  Loose stools likely related to ulcerative colitis  Avoid dehydration  Chronic anxiety/depression Resume home regimen.  Goals of care Patient  is a hospice care patient. Plan to discharge back to assisted living facility with hospice care. Patient will be switched to linezolid at time of discharge x6 weeks. She will need to have follow-up CBC every 2 weeks.  Code Status: DNR  Family  Communication: None at bedside, patient has a son called [name].  Disposition Plan: Likely will discharge to home with hospice care when ID signs off.   Consultants:  Infectious disease   Procedures:  2D echo.  Antimicrobials:  IV vancomycin  P.o. Flagyl.  DVT prophylaxis: Xarelto.  Status is: Inpatient    Dispo:  Patient From: Home  Planned Disposition: ALF, 03/18/2021 or when ID signs off.  Medically stable for discharge: No, ongoing management of sepsis secondary to acute parotiditis and MRSA bacteremia          Objective: Vitals:   03/16/21 0330 03/16/21 0806 03/16/21 1030 03/16/21 1231  BP: 108/67 102/63 100/67 93/61  Pulse: (!) 123 (!) 122 (!) 120 (!) 122  Resp: 16 18  18   Temp: 98.8 F (37.1 C) 98.5 F (36.9 C)  98.1 F (36.7 C)  TempSrc: Axillary Oral  Axillary  SpO2: 96% 96%    Weight: 49.3 kg     Height:        Intake/Output Summary (Last 24 hours) at 03/16/2021 1700 Last data filed at 03/15/2021 1723 Gross per 24 hour  Intake 200 ml  Output --  Net 200 ml   Filed Weights   03/14/21 0745 03/14/21 1850 03/16/21 0330  Weight: 49 kg 49 kg 49.3 kg    Exam:  . General: 80 y.o. year-old female very frail in no acute distress.  Somnolent. . Cardiovascular: Regular rate an rhythm no rubs or gallops. [name] Respiratory: clear to ausculation with not rales or wheezing.  Poor inspiratory effort.   . Abdomen: Soft nontender normal bowel sounds present. . Musculoskeletal: No lower extremity edema bilaterally. . Skin: No ulcerative lesions noted. [name] Psychiatry: Unable to assess mood due to somnolence.  Data Reviewed: CBC: Recent Labs  Lab 03/14/21 0631 03/15/21 0657  WBC 33.4* 24.9*  NEUTROABS 30.7*  --   HGB 12.9 10.1*  HCT 38.5 30.5*  MCV 90.4 91.9  PLT 219 119   Basic Metabolic Panel: Recent Labs  Lab 03/14/21 0631 03/15/21 0657  NA 136 134*  K 4.3 3.6  CL 96* 103  CO2 20* 23  GLUCOSE 136* 116*  BUN 16 16  CREATININE 1.12*  1.01*  CALCIUM 8.7* 7.3*   GFR: Estimated Creatinine Clearance: 33.4 mL/min (A) (by C-G formula based on SCr of 1.01 mg/dL (H)). Liver Function Tests: Recent Labs  Lab 03/14/21 0631  AST 16  ALT 11  ALKPHOS 58  BILITOT 1.1  PROT 6.3*  ALBUMIN 3.0*   No results for input(s): LIPASE, AMYLASE in the last 168 hours. No results for input(s): AMMONIA in the last 168 hours. Coagulation Profile: Recent Labs  Lab 03/14/21 0631  INR 1.3*   Cardiac Enzymes: No results for input(s): CKTOTAL, CKMB, CKMBINDEX, TROPONINI in the last 168 hours. BNP (last 3 results) No results for input(s): PROBNP in the last 8760 hours. HbA1C: No results for input(s): HGBA1C in the last 72 hours. CBG: Recent Labs  Lab 03/14/21 1042  GLUCAP 157*   Lipid Profile: No results for input(s): CHOL, HDL, LDLCALC, TRIG, CHOLHDL, LDLDIRECT in the last 72 hours. Thyroid Function Tests: No results for input(s): TSH, T4TOTAL, FREET4, T3FREE, THYROIDAB in the last 72 hours. Anemia Panel: No results for  input(s): VITAMINB12, FOLATE, FERRITIN, TIBC, IRON, RETICCTPCT in the last 72 hours. Urine analysis:    Component Value Date/Time   COLORURINE YELLOW 03/14/2021 1044   APPEARANCEUR CLEAR 03/14/2021 1044   LABSPEC >1.046 (H) 03/14/2021 1044   PHURINE 5.5 03/14/2021 1044   GLUCOSEU NEGATIVE 03/14/2021 1044   HGBUR NEGATIVE 03/14/2021 1044   [place] 03/14/2021 1044   KETONESUR 15 (A) 03/14/2021 1044   PROTEINUR TRACE (A) 03/14/2021 1044   NITRITE NEGATIVE 03/14/2021 1044   LEUKOCYTESUR NEGATIVE 03/14/2021 1044   Sepsis Labs: @LABRCNTIP (procalcitonin:4,lacticidven:4)  ) Recent Results (from the past 240 hour(s))  Resp Panel by RT-PCR (Flu A&B, Covid) Nasopharyngeal Swab     Status: None   Collection Time: 03/14/21  6:31 AM   Specimen: Nasopharyngeal Swab; Nasopharyngeal(NP) swabs in vial transport medium  Result Value Ref Range Status   SARS Coronavirus 2 by RT PCR NEGATIVE NEGATIVE Final     Comment: (NOTE) SARS-CoV-2 target nucleic acids are NOT DETECTED.  The SARS-CoV-2 RNA is generally detectable in upper respiratory specimens during the acute phase of infection. The lowest concentration of SARS-CoV-2 viral copies this assay can detect is 138 copies/mL. A negative result does not preclude SARS-Cov-2 infection and should not be used as the sole basis for treatment or other patient management decisions. A negative result may occur with  improper specimen collection/handling, submission of specimen other than nasopharyngeal swab, presence of viral mutation(s) within the areas targeted by this assay, and inadequate number of viral copies(<138 copies/mL). A negative result must be combined with clinical observations, patient history, and epidemiological information. The expected result is Negative.  Fact Sheet for Patients:  EntrepreneurPulse.com.au  Fact Sheet for Healthcare Providers:  IncredibleEmployment.be  This test is no t yet approved or cleared by the [place] FDA and  has been authorized for detection and/or diagnosis of SARS-CoV-2 by FDA under an Emergency Use Authorization (EUA). This EUA will remain  in effect (meaning this test can be used) for the duration of the COVID-19 declaration under Section 564(b)(1) of the Act, 21 U.S.C.section 360bbb-3(b)(1), unless the authorization is terminated  or revoked sooner.       Influenza A by PCR NEGATIVE NEGATIVE Final   Influenza B by PCR NEGATIVE NEGATIVE Final    Comment: (NOTE) The Xpert Xpress SARS-CoV-2/FLU/RSV plus assay is intended as an aid in the diagnosis of influenza from Nasopharyngeal swab specimens and should not be used as a sole basis for treatment. Nasal washings and aspirates are unacceptable for Xpert Xpress SARS-CoV-2/FLU/RSV testing.  Fact Sheet for Patients: EntrepreneurPulse.com.au  Fact Sheet for Healthcare  Providers: IncredibleEmployment.be  This test is not yet approved or cleared by the [place] FDA and has been authorized for detection and/or diagnosis of SARS-CoV-2 by FDA under an Emergency Use Authorization (EUA). This EUA will remain in effect (meaning this test can be used) for the duration of the COVID-19 declaration under Section 564(b)(1) of the Act, 21 U.S.C. section 360bbb-3(b)(1), unless the authorization is terminated or revoked.  Performed at [facility]   Blood Culture (routine x 2)     Status: Abnormal (Preliminary result)   Collection Time: 03/14/21  6:31 AM   Specimen: BLOOD  Result Value Ref Range Status   Specimen Description   Final    BLOOD BLOOD LEFT FOREARM Performed at [facility]    Special Requests   Final    BOTTLES DRAWN AEROBIC AND ANAEROBIC Blood Culture adequate volume Performed at [facility]    Culture  Setup Time   Final    GRAM POSITIVE COCCI ANAEROBIC BOTTLE ONLY CRITICAL VALUE NOTED.  VALUE IS CONSISTENT WITH PREVIOUSLY REPORTED AND CALLED VALUE. Performed at [facility], [place] [address], [place], [place] [address]    Culture STAPHYLOCOCCUS AUREUS (A)  Final   Report Status PENDING  Incomplete  Group A Strep by PCR     Status: None   Collection Time: 03/14/21  6:31 AM   Specimen: Peripheral; Sterile Swab  Result Value Ref Range Status   Group A Strep by PCR NOT DETECTED NOT DETECTED Final    Comment: Performed at [facility]  Blood Culture (routine x 2)     Status: Abnormal (Preliminary result)   Collection Time: 03/14/21  6:36 AM   Specimen: BLOOD  Result Value Ref Range Status   Specimen Description   Final    BLOOD BLOOD LEFT WRIST Performed at [facility]    Special Requests   Final    BOTTLES DRAWN AEROBIC AND ANAEROBIC Blood Culture adequate volume Performed at [facility]    Culture   Setup Time   Final    GRAM POSITIVE COCCI AEROBIC BOTTLE ONLY CRITICAL RESULT CALLED TO, READ BACK BY AND VERIFIED WITH: PHARMD Narda Bonds 03/15/2021 at [place].[facility]    Culture (A)  Final    STAPHYLOCOCCUS AUREUS SUSCEPTIBILITIES TO FOLLOW Performed at [facility], [place] [address] [place], [place] [address]    Report Status PENDING  Incomplete  Blood Culture ID Panel (Reflexed)     Status: Abnormal   Collection Time: 03/14/21  6:36 AM  Result Value Ref Range Status   Enterococcus faecalis NOT DETECTED NOT DETECTED Final   Enterococcus Faecium NOT DETECTED NOT DETECTED Final   Listeria monocytogenes NOT DETECTED NOT DETECTED Final   Staphylococcus species DETECTED (A) NOT DETECTED Final    Comment: CRITICAL RESULT CALLED TO, READ BACK BY AND VERIFIED WITH: PHARMD JAMES LEDFORD 03/15/2021 AT 0503 A.HUGHES    Staphylococcus aureus (BCID) DETECTED (A) NOT DETECTED Final    Comment: Methicillin (oxacillin)-resistant Staphylococcus aureus (MRSA). MRSA is predictably resistant to beta-lactam antibiotics (except ceftaroline). Preferred therapy is vancomycin unless clinically contraindicated. Patient requires contact precautions if  hospitalized. CRITICAL RESULT CALLED TO, READ BACK BY AND VERIFIED WITH: PHARMD JAMES LEDFORD 03/15/2021 AT 0503 A.HUGHES    Staphylococcus epidermidis NOT DETECTED NOT DETECTED Final   Staphylococcus lugdunensis NOT DETECTED NOT DETECTED Final   Streptococcus species NOT DETECTED NOT DETECTED Final   Streptococcus agalactiae NOT DETECTED NOT DETECTED Final   Streptococcus pneumoniae NOT DETECTED NOT DETECTED Final   Streptococcus pyogenes NOT DETECTED NOT DETECTED Final   A.calcoaceticus-baumannii NOT DETECTED NOT DETECTED Final   Bacteroides fragilis NOT DETECTED NOT DETECTED Final   Enterobacterales NOT DETECTED NOT DETECTED Final   Enterobacter cloacae complex NOT DETECTED NOT DETECTED Final   Escherichia coli NOT DETECTED NOT DETECTED Final    Klebsiella aerogenes NOT DETECTED NOT DETECTED Final   Klebsiella oxytoca NOT DETECTED NOT DETECTED Final   Klebsiella pneumoniae NOT DETECTED NOT DETECTED Final   Proteus species NOT DETECTED NOT DETECTED Final   Salmonella species NOT DETECTED NOT DETECTED Final   Serratia marcescens NOT DETECTED NOT DETECTED Final   Haemophilus influenzae NOT DETECTED NOT DETECTED Final   Neisseria meningitidis NOT DETECTED NOT DETECTED Final   Pseudomonas aeruginosa NOT DETECTED NOT DETECTED Final   Stenotrophomonas maltophilia NOT DETECTED NOT DETECTED Final  Candida albicans NOT DETECTED NOT DETECTED Final   Candida auris NOT DETECTED NOT DETECTED Final   Candida glabrata NOT DETECTED NOT DETECTED Final   Candida krusei NOT DETECTED NOT DETECTED Final   Candida parapsilosis NOT DETECTED NOT DETECTED Final   Candida tropicalis NOT DETECTED NOT DETECTED Final   Cryptococcus neoformans/gattii NOT DETECTED NOT DETECTED Final   Meth resistant mecA/C and MREJ DETECTED (A) NOT DETECTED Final    Comment: CRITICAL RESULT CALLED TO, READ BACK BY AND VERIFIED WITH: PHARMD JAMES LEDFORD 03/15/2021 AT 0503 A.HUGHES Performed at [facility], [place] [address] [place], [place] [address]   Urine culture     Status: None   Collection Time: 03/14/21 10:44 AM   Specimen: In/Out Cath Urine  Result Value Ref Range Status   Specimen Description   Final    IN/OUT CATH URINE Performed at [facility]    Special Requests NONE Performed at [facility]   Final   Culture   Final    NO GROWTH Performed at [facility], [place] [address] [place], [place] [address]    Report Status 03/15/2021 FINAL  Final  MRSA PCR Screening     Status: Abnormal   Collection Time: 03/14/21  6:00 PM   Specimen: Nasopharyngeal  Result Value Ref Range Status   MRSA by PCR POSITIVE (A) NEGATIVE Final    Comment:        The GeneXpert MRSA Assay (FDA approved for NASAL specimens only), is  one component of a comprehensive MRSA colonization surveillance program. It is not intended to diagnose MRSA infection nor to guide or monitor treatment for MRSA infections. RESULT CALLED TO, READ BACK BY AND VERIFIED WITH: K,HOLDEN RN @2022  03/14/21 EB Performed at [facility] [address], [place], [place] [address]   Respiratory (~20 pathogens) panel by PCR     Status: None   Collection Time: 03/14/21  6:30 PM  Result Value Ref Range Status   Adenovirus NOT DETECTED NOT DETECTED Final   Coronavirus 229E NOT DETECTED NOT DETECTED Final    Comment: (NOTE) The Coronavirus on the Respiratory Panel, DOES NOT test for the novel  Coronavirus (2019 nCoV)    Coronavirus HKU1 NOT DETECTED NOT DETECTED Final   Coronavirus NL63 NOT DETECTED NOT DETECTED Final   Coronavirus OC43 NOT DETECTED NOT DETECTED Final   Metapneumovirus NOT DETECTED NOT DETECTED Final   Rhinovirus / Enterovirus NOT DETECTED NOT DETECTED Final   Influenza A NOT DETECTED NOT DETECTED Final   Influenza B NOT DETECTED NOT DETECTED Final   Parainfluenza Virus 1 NOT DETECTED NOT DETECTED Final   Parainfluenza Virus 2 NOT DETECTED NOT DETECTED Final   Parainfluenza Virus 3 NOT DETECTED NOT DETECTED Final   Parainfluenza Virus 4 NOT DETECTED NOT DETECTED Final   Respiratory Syncytial Virus NOT DETECTED NOT DETECTED Final   Bordetella pertussis NOT DETECTED NOT DETECTED Final   Bordetella Parapertussis NOT DETECTED NOT DETECTED Final   Chlamydophila pneumoniae NOT DETECTED NOT DETECTED Final   Mycoplasma pneumoniae NOT DETECTED NOT DETECTED Final    Comment: Performed at [facility], [place]. [address] [place], [place] [address]  C Difficile Quick Screen w PCR reflex     Status: None   Collection Time: 03/15/21  6:00 PM   Specimen: STOOL  Result Value Ref Range Status   C Diff antigen NEGATIVE NEGATIVE Final   C Diff toxin NEGATIVE NEGATIVE Final   C  Diff interpretation No C. difficile detected.  Final     Comment: Performed at [facility], [place] [address], [place], [place] [address]  Culture, blood (routine x 2)     Status: None (Preliminary result)   Collection Time: 03/16/21  9:28 AM   Specimen: BLOOD  Result Value Ref Range Status   Specimen Description BLOOD RIGHT ANTECUBITAL  Final   Special Requests   Final    BOTTLES DRAWN AEROBIC AND ANAEROBIC Blood Culture adequate volume   Culture   Final    NO GROWTH < 12 HOURS Performed at [facility], [place] [address], [place], [place] [address]    Report Status PENDING  Incomplete  Culture, blood (routine x 2)     Status: None (Preliminary result)   Collection Time: 03/16/21  9:28 AM   Specimen: BLOOD  Result Value Ref Range Status   Specimen Description BLOOD BLOOD RIGHT HAND  Final   Special Requests   Final    BOTTLES DRAWN AEROBIC AND ANAEROBIC Blood Culture results may not be optimal due to an inadequate volume of blood received in culture bottles   Culture   Final    NO GROWTH < 12 HOURS Performed at [facility], [place] [address] [place], [place] [address]    Report Status PENDING  Incomplete      Studies: No results found.  Scheduled Meds: . Chlorhexidine Gluconate Cloth  6 each Topical Q0600  . collagenase   Topical Daily  . digoxin  0.0625 mg Oral Daily  . diltiazem  120 mg Oral Daily  . escitalopram  10 mg Oral Daily  . feeding supplement  237 mL Oral BID BM  . hydrocortisone sod succinate (SOLU-CORTEF) inj  50 mg Intravenous Q6H  . hydrOXYzine  25 mg Oral BID  . Mesalamine  400 mg Oral Daily  . metoprolol tartrate  12.5 mg Oral BID  . metroNIDAZOLE  500 mg Oral Q8H  . mupirocin ointment  1 application Nasal BID  . Rivaroxaban  15 mg Oral QHS  . sodium chloride flush  3 mL Intravenous Q12H    Continuous Infusions: . sodium chloride 50 mL/hr at 03/16/21 1244  . [START ON 03/17/2021] DAPTOmycin (CUBICIN)  IV    . diltiazem (CARDIZEM) infusion Stopped (03/15/21 1819)     LOS: 2 days      Kayleen Memos, MD [facility] Pager [phone]  If 7PM-7AM, please contact night-coverage [email or web address] Password [facility] 03/16/2021, 5:00 PM

## 2021-03-16 NOTE — Progress Notes (Signed)
Initial Nutrition Assessment  DOCUMENTATION CODES:   Not applicable  INTERVENTION:  Provide Ensure Enlive po BID, each supplement provides 350 kcal and 20 grams of protein  Encourage adequate PO intake.   NUTRITION DIAGNOSIS:   Increased nutrient needs related to acute illness (MRSA bacteremia) as evidenced by estimated needs.  GOAL:   Patient will meet greater than or equal to 90% of their needs  MONITOR:   PO intake,Supplement acceptance,Skin,Weight trends,I & O's,Labs  REASON FOR ASSESSMENT:   Malnutrition Screening Tool    ASSESSMENT:   80 y.o. female with medical history significant of UC; paroxysmal A. fib on Xarelto and digoxin s/p [place]; CAD s/p CABG; chronic diastolic dysfunction; Addison's disease; breast cancer; COVID in 11/2020; HTN; and HLD who presented with neck pain. Pt with Sepsis due to acute parotiditis and MRSA bacteremia  Pt poor historian. Unable to obtain pt nutrition history at this time. Meal completion has been 50%. Pt has been tolerating her PO well. Pt currently has Ensure ordered and has been consuming them. RD to continue with current orders to aid in caloric and protein needs. Plans for pt to discharge back to [facility] with hospice services. Per MD, pt interested in some minimal treatments to try to help her improve with clear goal of comfort.   NUTRITION - FOCUSED PHYSICAL EXAM:  Flowsheet Row Most Recent Value  Orbital Region Unable to assess  Upper Arm Region Moderate depletion  Thoracic and Lumbar Region Unable to assess  Buccal Region Unable to assess  Temple Region Unable to assess  Clavicle Bone Region No depletion  Clavicle and Acromion Bone Region Unable to assess  Scapular Bone Region Unable to assess  Dorsal Hand Unable to assess  Patellar Region Moderate depletion  Anterior Thigh Region Moderate depletion  Posterior Calf Region Severe depletion  Edema (RD Assessment) None  Hair Reviewed  Eyes Reviewed  Mouth Reviewed   Skin Reviewed  Nails Reviewed     Labs and medications reviewed.   Diet Order:   Diet Order            DIET SOFT Room service appropriate? Yes; Fluid consistency: Thin  Diet effective now                 EDUCATION NEEDS:   Not appropriate for education at this time  Skin:  Skin Assessment: Skin Integrity Issues: Skin Integrity Issues:: Unstageable Unstageable: R heel  Last BM:  4/24  Height:   Ht Readings from Last 1 Encounters:  03/14/21 5' 3"  (1.6 m)    Weight:   Wt Readings from Last 1 Encounters:  03/16/21 49.3 kg   BMI:  Body mass index is 19.25 kg/m.  Estimated Nutritional Needs:   Kcal:  1450-1650  Protein:  60-75 grams  Fluid:  >/= 1.5 L/day  Corrin Parker, MS, RD, LDN RD pager number/after hours weekend pager number on Amion.

## 2021-03-16 NOTE — Consult Note (Signed)
[facility] Nurse wound consult note Consultation was completed by review of records, images and assistance from the bedside nurse/clinical staff.  Reason for Consult: pressure injury and skin tears. The wounds are mentioned by the ED nurse; no mention of mechanical fall. Wound type: Left heel Ustageable Pressure Injury 3 skin tears; partial thickness; left thigh; left knee; and left pretibial area. Pressure Injury POA: Yes Measurement: see nursing flow seehts Wound bed: Skin tears are non infected appearing, skin flaps are not salvageable, clean and pink Left heel wound 80% yellow/20% pink circumferentially  Drainage (amount, consistency, odor) minimal, no odor Periwound: frail lady with bruising over body Dressing procedure/placement/frequency: Enzymatic debridement to the left heel wound, saline moist gauze and dry dressing. Change daily  Prevalon boot to offload the heel ulcer  Xeroform gauze the to skin tears, cover with silicone foam change every 3 days.     Re consult if needed, will not follow at this time. Thanks  Anjela Cassara [employer], RN,CWOCN, CNS, [place] [phone])

## 2021-03-16 NOTE — Progress Notes (Addendum)
Michele Green [id number] Hospitalized Hospice Patient [facility])  Michele Green is a current [facility] hospice patient from [facility] with a terminal diagnosis of hypertensive heart disease. Facility notified [facility] on call staff that patient was experiencing swelling on the right side of her neck with associated pain. After assessment and discussing with patient's son, the decision was made to have EMS transport her to ED for evaluation. Patient was admitted to [facility] on 4/23 with a diagnosis of Sepsis due to acute parotiditis and MRSA bacteremia. Per [facility] physician Dr. Gilford Rile, this is a related admission.   Report exchanged. Visited patient at the bedside. She is alert to person and situation. She states she is feeling terrible today, she cannot expound other than to say she is tired. Attempted to discuss with her the recommendations from in infectious disease (ID) MD regarding IV antibiotic therapy and how that is not something that hospice can provide. Attending discussed with ID about alternative recommendations and they recommended a six week course of linezolid. Hospice attending confirmed that we can accommodate this recommendation for her. Updated her son Michele Green via phone, he is two hours away and could not come to see her today.   V/S: 98.3 oral, 110/66, HR 130, RR 18, SPO2 97% on RA I&O: 828/403 Labs: none today Diagnostics: none today IVs/PRNs: solu-cortef 50 mg IV q6h, NS @ 50 ml/hr, vancomycin 1,000 mg IV q 48 hours  Problem List: - sepsis due to acute parodititis and MRSA bacteremia - currently on vancomycin, plans to start daptomycin on 4/26 with transition to linezolid orally  - a. Fib/a. Flutter - was on diltiazem drip > now discontinued, on xarelto and digoxin  GOC: clear, focus on comfort but treat what can be treated D/C planning: return to ALF. Son Michele Green to call facility to inquire about increasing her assistance at ALF (currently a 4/6). She will be prescribed  linezolid for 6 weeks and need a CBC every two weeks.  IDT: hospice team updated Family: updated Michele Green via phone  Thank you, Venia Carbon RN, BSN, [facility] Liaison

## 2021-03-17 LAB — CULTURE, BLOOD (ROUTINE X 2)
Special Requests: ADEQUATE
Special Requests: ADEQUATE

## 2021-03-17 LAB — CK: Total CK: 13 U/L — ABNORMAL LOW (ref 38–234)

## 2021-03-17 NOTE — Progress Notes (Signed)
Patient reportedly sustained a small skin tear to R dorsal hand from phlebotomy at 0313. Charge Nurse notified. [facility] Floor Night Coverage  Dr. Cyd Silence paged and made aware. VS obtained + stable. Patient denies pain; Resting in no apparent distress. Will continue to monitor. Leveda Anna, BSN, RN

## 2021-03-17 NOTE — Plan of Care (Signed)
  Problem: Pain Managment: Goal: General experience of comfort will improve Outcome: Progressing   Problem: Safety: Goal: Ability to remain free from injury will improve Outcome: Progressing   Problem: Skin Integrity: Goal: Risk for impaired skin integrity will decrease Outcome: Progressing   Problem: Fluid Volume: Goal: Hemodynamic stability will improve Outcome: Progressing   Problem: Clinical Measurements: Goal: Signs and symptoms of infection will decrease Outcome: Progressing   Problem: Respiratory: Goal: Ability to maintain adequate ventilation will improve Outcome: Progressing

## 2021-03-17 NOTE — Progress Notes (Signed)
ID Brief Note.   Patient is afebrile, downtrending leukocytosis Repeat blood cx 4/25 NG in less than 24 hrs Blood cx 4/26 pending  Per primary team's discussion with Hospice, they will not be able to do PICC line and IV antibiotics and hence, are looking for PO options. If IV antibiotics is not feasible per hospice service, Will plan to do one dose of oritavancin followed by 4 weeks of Linezolid after repeat blood culture are negative for 48 to 72 hrs.  Will need monitoring of CBC while on Linezolid, at least every 2 weeks.  Following  Rosiland Oz, MD Infectious Disease Physician [facility] [address] [place], [place] [address] Phone: [phone]  Fax: [phone]

## 2021-03-17 NOTE — Progress Notes (Signed)
PROGRESS NOTE  HUDSYN BARICH GBT:[id number] DOB: 06/22/1941 DOA: 03/14/2021 PCP: [name], MD  HPI/Recap of past 24 hours:  Michele Green is a 80 y.o. female with medical history significant of UC; chronic loose stools, paroxysmal A. fib on Xarelto and digoxin s/p [place]aze; CAD s/p CABG; chronic diastolic CHF; Addison's disease; breast cancer; COVID in 11/2020; HTN; and HLD who presented to [facility] with neck pain. She was hospitalized in Jan 2022 for COVID PNA and afib with RVR.  She was then hospitalized in late February for afib with RVR and steroid-induced adrenal insufficiency.  She was admitted again on 3/5 for afib with RvR and transferred to [facility] with DCCV cardioversrion on 3/10.  She converted to NSR but reverted back to aflutter.  She was rate controlled with IV Digoxin and was discharged back to SNF on 3/14.  She developed R lateral face and neck pain and presented to [facility] where she was found to have SIRS criteria; code sepsis was activated.  She confirmed DNR; and voiced an interest in some minimal treatments to try to help her to improve but with a clear goal of comfort.   CT soft tissues neck with contrast done on 03/14/2021 revealed acute parotiditis on the right.  She was started on broad-spectrum IV antibiotic Unasyn.  Blood cultures came back positive for MRSA, was switched to IV daptomycin with oral Flagyl added by ID on 03/16/2021.    Discussed with ID, patient will receive 1 dose of oritavancin and 4 weeks of oral linezolid once repeated blood cultures are negative x48 hours.  Plan is to discharge her back to assisted living facility with hospice care, possibly on 03/18/2021 if repeated blood cultures remain negative.   03/17/21: Patient was seen and examined at her bedside.  She reports mild left-sided neck pain.  She has no other complaints.  Discussed plan of care and discharge planning.  Patient understands and agrees with plan.  Assessment/Plan: Principal Problem:   MRSA  bacteremia Active Problems:   Essential hypertension   Atrial flutter with rapid ventricular response (HCC)   Goals of care, counseling/discussion   Adrenal insufficiency (Addison's disease) ([place])   Functional quadriplegia (HCC)   Acute parotitis   Sepsis ([place])   Multiple open wounds of lower extremity  Sepsis due to acute parotiditis and MRSA bacteremia Presented with leukocytosis 33,000, fever, tachycardia, tachypnea Initially started on Unasyn on 03/14/2021, switched to IV daptomycin and p.o. Flagyl on 03/15/2021 due to MRSA bacteremia. 2D echo results, no evidence of endocarditis. Repeat blood cultures x2 peripherally on 03/16/2021 negative to date Infectious disease following, appreciate assistance. Discussed with ID, patient will receive 1 dose of oritavancin and 4 weeks of oral linezolid once repeated blood cultures are negative x48 hours.  Plan is to discharge her back to assisted living facility with hospice care, possibly on 03/18/2021 if repeated blood cultures remain negative.  MRSA bacteremia, possibly wound care source of infection. No evidence of endocarditis from 2D echo. Repeat blood cultures x2 peripherally on 03/16/2021, negative to date. Continue to follow cultures. WBC is downtrending, 24,000 from 33,000. Monitor fever curve and WBC. Continue IV daptomycin and p.o. Flagyl as recommended by ID Plan for 6 weeks total of antibiotics.  Chronic A. Fib/flutter Continue diltiazem She is on home p.o. digoxin Dig level normal, 1.2. Continue Xarelto for CVA prevention  Leg wounds, present on admission. Continue local wound care  History of breast cancer She is no longer on tamoxifen  Essential hypertension/hyperlipidemia At  home she was on p.o. Lasix 40 mg daily, p.o. diltiazem 240 mg daily, digoxin 62.5 mcg daily Continue to monitor vital signs She is currently not on any statin drugs  Ulcerative colitis Continue home mesalamine or substitute  Chronic loose  stools likely related to ulcerative colitis  Avoid dehydration  Chronic anxiety/depression Continue home regimen.  Goals of care Patient is a hospice care patient. Plan to discharge back to assisted living facility with hospice care. Patient will be switched to linezolid at time of discharge x4 weeks after receiving a dose of oritavancin which is 2 weeks lasting. She will need to have follow-up CBC every 2 weeks to monitor for thrombocytopenia with the use of linezolid.  Code Status: DNR  Family Communication: None at bedside.  Disposition Plan: Likely will discharge to assisted living facility with hospice care on 03/18/2021 if repeated blood cultures remain negative or when ID signs off.   Consultants:  Infectious disease   Procedures:  2D echo.  Antimicrobials:  IV vancomycin  P.o. Flagyl.  DVT prophylaxis: Xarelto.  Status is: Inpatient    Dispo:  Patient From: Assisted living facility with hospice  Planned Disposition: Assisted living facility with hospice on 03/18/2021 or when ID signs off.  Medically stable for discharge: No, ongoing management of sepsis secondary to acute parotiditis and MRSA bacteremia          Objective: Vitals:   03/17/21 0343 03/17/21 0824 03/17/21 0940 03/17/21 1200  BP: 108/66 (!) 107/46 103/67 93/69  Pulse: (!) 120 (!) 116 (!) 116 (!) 123  Resp: 18 18  16   Temp: 98.6 F (37 C) (!) 97.3 F (36.3 C)  (!) 97.4 F (36.3 C)  TempSrc: Oral Oral  Oral  SpO2: 96% 95%  96%  Weight:      Height:        Intake/Output Summary (Last 24 hours) at 03/17/2021 1600 Last data filed at 03/17/2021 1533 Gross per 24 hour  Intake 1075.72 ml  Output 100 ml  Net 975.72 ml   Filed Weights   03/14/21 0745 03/14/21 1850 03/16/21 0330  Weight: 49 kg 49 kg 49.3 kg    Exam:  . General: 80 y.o. year-old female very frail-appearing no acute distress.  She is alert and oriented x3.   . Cardiovascular: Irregular rate and rhythm no rubs or  gallops.   [name] Respiratory: Clear to auscultation no wheezes or rales.   . Abdomen: Soft nontender normal bowel sounds present.   . Musculoskeletal: No lower extremity edema bilaterally. . Skin: Right side of face appears much improved.  Minimal edema and erythema. [name] Psychiatry: Mood is appropriate for condition and setting.  Data Reviewed: CBC: Recent Labs  Lab 03/14/21 0631 03/15/21 0657  WBC 33.4* 24.9*  NEUTROABS 30.7*  --   HGB 12.9 10.1*  HCT 38.5 30.5*  MCV 90.4 91.9  PLT 219 469   Basic Metabolic Panel: Recent Labs  Lab 03/14/21 0631 03/15/21 0657  NA 136 134*  K 4.3 3.6  CL 96* 103  CO2 20* 23  GLUCOSE 136* 116*  BUN 16 16  CREATININE 1.12* 1.01*  CALCIUM 8.7* 7.3*   GFR: Estimated Creatinine Clearance: 33.4 mL/min (A) (by C-G formula based on SCr of 1.01 mg/dL (H)). Liver Function Tests: Recent Labs  Lab 03/14/21 0631  AST 16  ALT 11  ALKPHOS 58  BILITOT 1.1  PROT 6.3*  ALBUMIN 3.0*   No results for input(s): LIPASE, AMYLASE in the last 168 hours. No results  for input(s): AMMONIA in the last 168 hours. Coagulation Profile: Recent Labs  Lab 03/14/21 0631  INR 1.3*   Cardiac Enzymes: Recent Labs  Lab 03/17/21 0313  CKTOTAL 13*   BNP (last 3 results) No results for input(s): PROBNP in the last 8760 hours. HbA1C: No results for input(s): HGBA1C in the last 72 hours. CBG: Recent Labs  Lab 03/14/21 1042  GLUCAP 157*   Lipid Profile: No results for input(s): CHOL, HDL, LDLCALC, TRIG, CHOLHDL, LDLDIRECT in the last 72 hours. Thyroid Function Tests: No results for input(s): TSH, T4TOTAL, FREET4, T3FREE, THYROIDAB in the last 72 hours. Anemia Panel: No results for input(s): VITAMINB12, FOLATE, FERRITIN, TIBC, IRON, RETICCTPCT in the last 72 hours. Urine analysis:    Component Value Date/Time   COLORURINE YELLOW 03/14/2021 1044   APPEARANCEUR CLEAR 03/14/2021 1044   LABSPEC >1.046 (H) 03/14/2021 1044   PHURINE 5.5 03/14/2021 1044    GLUCOSEU NEGATIVE 03/14/2021 1044   HGBUR NEGATIVE 03/14/2021 1044   [place] 03/14/2021 1044   KETONESUR 15 (A) 03/14/2021 1044   PROTEINUR TRACE (A) 03/14/2021 1044   NITRITE NEGATIVE 03/14/2021 1044   LEUKOCYTESUR NEGATIVE 03/14/2021 1044   Sepsis Labs: @LABRCNTIP (procalcitonin:4,lacticidven:4)  ) Recent Results (from the past 240 hour(s))  Resp Panel by RT-PCR (Flu A&B, Covid) Nasopharyngeal Swab     Status: None   Collection Time: 03/14/21  6:31 AM   Specimen: Nasopharyngeal Swab; Nasopharyngeal(NP) swabs in vial transport medium  Result Value Ref Range Status   SARS Coronavirus 2 by RT PCR NEGATIVE NEGATIVE Final    Comment: (NOTE) SARS-CoV-2 target nucleic acids are NOT DETECTED.  The SARS-CoV-2 RNA is generally detectable in upper respiratory specimens during the acute phase of infection. The lowest concentration of SARS-CoV-2 viral copies this assay can detect is 138 copies/mL. A negative result does not preclude SARS-Cov-2 infection and should not be used as the sole basis for treatment or other patient management decisions. A negative result may occur with  improper specimen collection/handling, submission of specimen other than nasopharyngeal swab, presence of viral mutation(s) within the areas targeted by this assay, and inadequate number of viral copies(<138 copies/mL). A negative result must be combined with clinical observations, patient history, and epidemiological information. The expected result is Negative.  Fact Sheet for Patients:  EntrepreneurPulse.com.au  Fact Sheet for Healthcare Providers:  IncredibleEmployment.be  This test is no t yet approved or cleared by the [place] FDA and  has been authorized for detection and/or diagnosis of SARS-CoV-2 by FDA under an Emergency Use Authorization (EUA). This EUA will remain  in effect (meaning this test can be used) for the duration of the COVID-19  declaration under Section 564(b)(1) of the Act, 21 U.S.C.section 360bbb-3(b)(1), unless the authorization is terminated  or revoked sooner.       Influenza A by PCR NEGATIVE NEGATIVE Final   Influenza B by PCR NEGATIVE NEGATIVE Final    Comment: (NOTE) The Xpert Xpress SARS-CoV-2/FLU/RSV plus assay is intended as an aid in the diagnosis of influenza from Nasopharyngeal swab specimens and should not be used as a sole basis for treatment. Nasal washings and aspirates are unacceptable for Xpert Xpress SARS-CoV-2/FLU/RSV testing.  Fact Sheet for Patients: EntrepreneurPulse.com.au  Fact Sheet for Healthcare Providers: IncredibleEmployment.be  This test is not yet approved or cleared by the [place] FDA and has been authorized for detection and/or diagnosis of SARS-CoV-2 by FDA under an Emergency Use Authorization (EUA). This EUA will remain in effect (meaning this test can be  used) for the duration of the COVID-19 declaration under Section 564(b)(1) of the Act, 21 U.S.C. section 360bbb-3(b)(1), unless the authorization is terminated or revoked.  Performed at [facility]   Blood Culture (routine x 2)     Status: Abnormal   Collection Time: 03/14/21  6:31 AM   Specimen: BLOOD  Result Value Ref Range Status   Specimen Description   Final    BLOOD BLOOD LEFT FOREARM Performed at [facility]    Special Requests   Final    BOTTLES DRAWN AEROBIC AND ANAEROBIC Blood Culture adequate volume Performed at [facility]    Culture  Setup Time   Final    GRAM POSITIVE COCCI ANAEROBIC BOTTLE ONLY CRITICAL VALUE NOTED.  VALUE IS CONSISTENT WITH PREVIOUSLY REPORTED AND CALLED VALUE.    Culture (A)  Final    STAPHYLOCOCCUS AUREUS SUSCEPTIBILITIES PERFORMED ON PREVIOUS CULTURE WITHIN THE LAST 5 DAYS. Performed at [facility], [place] [address] [place], [place] [address]    Report Status  03/17/2021 FINAL  Final  Group A Strep by PCR     Status: None   Collection Time: 03/14/21  6:31 AM   Specimen: Peripheral; Sterile Swab  Result Value Ref Range Status   Group A Strep by PCR NOT DETECTED NOT DETECTED Final    Comment: Performed at [facility]  Blood Culture (routine x 2)     Status: Abnormal   Collection Time: 03/14/21  6:36 AM   Specimen: BLOOD  Result Value Ref Range Status   Specimen Description   Final    BLOOD BLOOD LEFT WRIST Performed at [facility]    Special Requests   Final    BOTTLES DRAWN AEROBIC AND ANAEROBIC Blood Culture adequate volume Performed at [facility]    Culture  Setup Time   Final    GRAM POSITIVE COCCI AEROBIC BOTTLE ONLY CRITICAL RESULT CALLED TO, READ BACK BY AND VERIFIED WITH: Beulah Gandy 03/15/2021 at [place] Evert Performed at [facility], [place] [address], [place], [place] [address]    Culture METHICILLIN RESISTANT STAPHYLOCOCCUS AUREUS (A)  Final   Report Status 03/17/2021 FINAL  Final   Organism ID, Bacteria METHICILLIN RESISTANT STAPHYLOCOCCUS AUREUS  Final      Susceptibility   Methicillin resistant staphylococcus aureus - MIC*    CIPROFLOXACIN >=8 RESISTANT Resistant     ERYTHROMYCIN >=8 RESISTANT Resistant     GENTAMICIN <=0.5 SENSITIVE Sensitive     OXACILLIN >=4 RESISTANT Resistant     TETRACYCLINE <=1 SENSITIVE Sensitive     VANCOMYCIN <=0.5 SENSITIVE Sensitive     TRIMETH/SULFA >=320 RESISTANT Resistant     CLINDAMYCIN <=0.25 SENSITIVE Sensitive     RIFAMPIN <=0.5 SENSITIVE Sensitive     Inducible Clindamycin NEGATIVE Sensitive     * METHICILLIN RESISTANT STAPHYLOCOCCUS AUREUS  Blood Culture ID Panel (Reflexed)     Status: Abnormal   Collection Time: 03/14/21  6:36 AM  Result Value Ref Range Status   Enterococcus faecalis NOT DETECTED NOT DETECTED Final   Enterococcus Faecium NOT DETECTED NOT DETECTED Final   Listeria monocytogenes NOT DETECTED  NOT DETECTED Final   Staphylococcus species DETECTED (A) NOT DETECTED Final    Comment: CRITICAL RESULT CALLED TO, READ BACK BY AND VERIFIED WITH: PHARMD JAMES LEDFORD 03/15/2021 AT 0503 A.HUGHES    Staphylococcus aureus (BCID) DETECTED (A) NOT DETECTED Final    Comment: Methicillin (oxacillin)-resistant Staphylococcus aureus (  MRSA). MRSA is predictably resistant to beta-lactam antibiotics (except ceftaroline). Preferred therapy is vancomycin unless clinically contraindicated. Patient requires contact precautions if  hospitalized. CRITICAL RESULT CALLED TO, READ BACK BY AND VERIFIED WITH: PHARMD JAMES LEDFORD 03/15/2021 AT 0503 A.HUGHES    Staphylococcus epidermidis NOT DETECTED NOT DETECTED Final   Staphylococcus lugdunensis NOT DETECTED NOT DETECTED Final   Streptococcus species NOT DETECTED NOT DETECTED Final   Streptococcus agalactiae NOT DETECTED NOT DETECTED Final   Streptococcus pneumoniae NOT DETECTED NOT DETECTED Final   Streptococcus pyogenes NOT DETECTED NOT DETECTED Final   A.calcoaceticus-baumannii NOT DETECTED NOT DETECTED Final   Bacteroides fragilis NOT DETECTED NOT DETECTED Final   Enterobacterales NOT DETECTED NOT DETECTED Final   Enterobacter cloacae complex NOT DETECTED NOT DETECTED Final   Escherichia coli NOT DETECTED NOT DETECTED Final   Klebsiella aerogenes NOT DETECTED NOT DETECTED Final   Klebsiella oxytoca NOT DETECTED NOT DETECTED Final   Klebsiella pneumoniae NOT DETECTED NOT DETECTED Final   Proteus species NOT DETECTED NOT DETECTED Final   Salmonella species NOT DETECTED NOT DETECTED Final   Serratia marcescens NOT DETECTED NOT DETECTED Final   Haemophilus influenzae NOT DETECTED NOT DETECTED Final   Neisseria meningitidis NOT DETECTED NOT DETECTED Final   Pseudomonas aeruginosa NOT DETECTED NOT DETECTED Final   Stenotrophomonas maltophilia NOT DETECTED NOT DETECTED Final   Candida albicans NOT DETECTED NOT DETECTED Final   Candida auris NOT DETECTED  NOT DETECTED Final   Candida glabrata NOT DETECTED NOT DETECTED Final   Candida krusei NOT DETECTED NOT DETECTED Final   Candida parapsilosis NOT DETECTED NOT DETECTED Final   Candida tropicalis NOT DETECTED NOT DETECTED Final   Cryptococcus neoformans/gattii NOT DETECTED NOT DETECTED Final   Meth resistant mecA/C and MREJ DETECTED (A) NOT DETECTED Final    Comment: CRITICAL RESULT CALLED TO, READ BACK BY AND VERIFIED WITH: PHARMD JAMES LEDFORD 03/15/2021 AT 0503 A.HUGHES Performed at [facility], [place] [address] [place], [place] [address]   Urine culture     Status: None   Collection Time: 03/14/21 10:44 AM   Specimen: In/Out Cath Urine  Result Value Ref Range Status   Specimen Description   Final    IN/OUT CATH URINE Performed at [facility]    Special Requests NONE Performed at [facility]   Final   Culture   Final    NO GROWTH Performed at [facility], [place] [address], [place], [place] [address]    Report Status 03/15/2021 FINAL  Final  MRSA PCR Screening     Status: Abnormal   Collection Time: 03/14/21  6:00 PM   Specimen: Nasopharyngeal  Result Value Ref Range Status   MRSA by PCR POSITIVE (A) NEGATIVE Final    Comment:        The GeneXpert MRSA Assay (FDA approved for NASAL specimens only), is one component of a comprehensive MRSA colonization surveillance program. It is not intended to diagnose MRSA infection nor to guide or monitor treatment for MRSA infections. RESULT CALLED TO, READ BACK BY AND VERIFIED WITH: K,HOLDEN RN @2022  03/14/21 EB Performed at [facility] [address] [place], [place] [address]   Respiratory (~20 pathogens) panel by PCR     Status: None   Collection Time: 03/14/21  6:30 PM  Result Value Ref Range Status   Adenovirus NOT DETECTED NOT DETECTED Final   Coronavirus 229E NOT DETECTED NOT DETECTED Final    Comment: (NOTE) The Coronavirus  on the Respiratory Panel, DOES NOT test  for the novel  Coronavirus (2019 nCoV)    Coronavirus HKU1 NOT DETECTED NOT DETECTED Final   Coronavirus NL63 NOT DETECTED NOT DETECTED Final   Coronavirus OC43 NOT DETECTED NOT DETECTED Final   Metapneumovirus NOT DETECTED NOT DETECTED Final   Rhinovirus / Enterovirus NOT DETECTED NOT DETECTED Final   Influenza A NOT DETECTED NOT DETECTED Final   Influenza B NOT DETECTED NOT DETECTED Final   Parainfluenza Virus 1 NOT DETECTED NOT DETECTED Final   Parainfluenza Virus 2 NOT DETECTED NOT DETECTED Final   Parainfluenza Virus 3 NOT DETECTED NOT DETECTED Final   Parainfluenza Virus 4 NOT DETECTED NOT DETECTED Final   Respiratory Syncytial Virus NOT DETECTED NOT DETECTED Final   Bordetella pertussis NOT DETECTED NOT DETECTED Final   Bordetella Parapertussis NOT DETECTED NOT DETECTED Final   Chlamydophila pneumoniae NOT DETECTED NOT DETECTED Final   Mycoplasma pneumoniae NOT DETECTED NOT DETECTED Final    Comment: Performed at [facility], [place] [address], [place], [place] [address]  C Difficile Quick Screen w PCR reflex     Status: None   Collection Time: 03/15/21  6:00 PM   Specimen: STOOL  Result Value Ref Range Status   C Diff antigen NEGATIVE NEGATIVE Final   C Diff toxin NEGATIVE NEGATIVE Final   C Diff interpretation No C. difficile detected.  Final    Comment: Performed at [facility], [place] [address], [place], [place] [address]  Culture, blood (routine x 2)     Status: None (Preliminary result)   Collection Time: 03/16/21  9:28 AM   Specimen: BLOOD  Result Value Ref Range Status   Specimen Description BLOOD RIGHT ANTECUBITAL  Final   Special Requests   Final    BOTTLES DRAWN AEROBIC AND ANAEROBIC Blood Culture adequate volume   Culture   Final    NO GROWTH < 24 HOURS Performed at [facility], [place] [address] [place], [place] [address]    Report Status PENDING  Incomplete  Culture, blood (routine x 2)     Status: None (Preliminary result)   Collection  Time: 03/16/21  9:28 AM   Specimen: BLOOD  Result Value Ref Range Status   Specimen Description BLOOD BLOOD RIGHT HAND  Final   Special Requests   Final    BOTTLES DRAWN AEROBIC AND ANAEROBIC Blood Culture results may not be optimal due to an inadequate volume of blood received in culture bottles   Culture   Final    NO GROWTH < 24 HOURS Performed at [facility], [place] [address], [place], [place] [address]    Report Status PENDING  Incomplete      Studies: No results found.  Scheduled Meds: . Chlorhexidine Gluconate Cloth  6 each Topical Q0600  . collagenase   Topical Daily  . digoxin  0.0625 mg Oral Daily  . diltiazem  120 mg Oral Daily  . escitalopram  10 mg Oral Daily  . feeding supplement  237 mL Oral BID BM  . hydrocortisone sod succinate (SOLU-CORTEF) inj  50 mg Intravenous Q6H  . hydrOXYzine  25 mg Oral BID  . Mesalamine  400 mg Oral Daily  . metoprolol tartrate  12.5 mg Oral BID  . metroNIDAZOLE  500 mg Oral Q8H  . mupirocin ointment  1 application Nasal BID  . Rivaroxaban  15 mg Oral QHS  . sodium chloride flush  3 mL Intravenous Q12H  Continuous Infusions: . sodium chloride 50 mL/hr at 03/17/21 1040  . DAPTOmycin (CUBICIN)  IV    . diltiazem (CARDIZEM) infusion Stopped (03/15/21 1818)     LOS: 3 days     Kayleen Memos, MD [facility] Pager [phone]  If 7PM-7AM, please contact night-coverage [email or web address] Password [email or web address] 03/17/2021, 4:00 PM

## 2021-03-17 NOTE — Consult Note (Signed)
   [facility] Inpatient Consult   03/17/2021  RAYLEEN WYRICK 1941-09-09 [id number]   [place] [facility] [ACO] Patient: [employer], nowing showing as Hospice  Chart reviewed and reveals the patient is currently active with [facility].   Patient will have full case management services through Hospice and needs will be met at the hospice level of care.  Plan:  Sign off at transition  Natividad Brood, RN BSN [facility] Liaison  [phone] business mobile phone Toll free office [phone]  Fax number: [phone] [email or web address]

## 2021-03-17 NOTE — Progress Notes (Signed)
[name] 2N56 Hospitalized Hospice Patient [facility])  Michele Green is a current [facility] hospice patient from [facility] with a terminal diagnosis of hypertensive heart disease. Facility notified [facility] on call staff that patient was experiencing swelling on the right side of her neck with associated pain. After assessment and discussing with patient's son, the decision was made to have EMS transport her to ED for evaluation. Patient was admitted to [facility] on 4/23 with a diagnosis ofSepsis due to acute parotiditis and MRSA bacteremia.Per [facility] physician Dr. Gilford Rile, this is a related admission.  Visited patient at the bedside, she was asleep and I did not waken her. Spoke with son Michele Green on the phone. He spoke with her AL and requested that she be moved from a 4/6 to a 6/6. He advised the facility needed to come and re-evaluate her before they decide if she can return. He would like to stay with hospice services and continue oral antibiotics when she is able to discharge.  V/S: 97.3 oral, 107/46, HR 116, RR 18, SPO2 95% on RA I&O: 657/no recorded output Labs: no new labs Diagnostics: no new diagnostics IVs/PRNs: solu-cortef 50 mg IV q6h, NS @ 50 ml/hr, daptomycin 400 mg IV QD  Problem List: - sepsis due to acute parodititis and MRSA bacteremia - currently on vancomycin, plans to start daptomycin on 4/26 with transition to linezolid orally once ready to discharge - a. Fib/a. Flutter - was on diltiazem drip > now discontinued, on xarelto and digoxin  GOC: clear, focus on comfort but treat what can be treated D/C planning: return to ALF. Son contacted ALF to discuss increasing her level of assistance to a 6/6, he advised they need to come to re-evaluate her to make sure they can provide the level of care she is needing. She will be prescribed linezolid for 6 weeks and need a CBC every two weeks.  IDT: hospice team updated Family: updated Michele Green via phone  Thank  you, Venia Carbon RN, BSN, [facility] Liaison

## 2021-03-18 DIAGNOSIS — Z7901 Long term (current) use of anticoagulants: Secondary | ICD-10-CM

## 2021-03-18 DIAGNOSIS — Z7189 Other specified counseling: Secondary | ICD-10-CM

## 2021-03-18 LAB — CBC
HCT: 28.7 % — ABNORMAL LOW (ref 36.0–46.0)
Hemoglobin: 9.5 g/dL — ABNORMAL LOW (ref 12.0–15.0)
MCH: 30 pg (ref 26.0–34.0)
MCHC: 33.1 g/dL (ref 30.0–36.0)
MCV: 90.5 fL (ref 80.0–100.0)
Platelets: 172 10*3/uL (ref 150–400)
RBC: 3.17 MIL/uL — ABNORMAL LOW (ref 3.87–5.11)
RDW: 15.3 % (ref 11.5–15.5)
WBC: 8.8 10*3/uL (ref 4.0–10.5)
nRBC: 0 % (ref 0.0–0.2)

## 2021-03-18 LAB — BASIC METABOLIC PANEL
Anion gap: 10 (ref 5–15)
BUN: 21 mg/dL (ref 8–23)
CO2: 25 mmol/L (ref 22–32)
Calcium: 8 mg/dL — ABNORMAL LOW (ref 8.9–10.3)
Chloride: 104 mmol/L (ref 98–111)
Creatinine, Ser: 0.67 mg/dL (ref 0.44–1.00)
GFR, Estimated: >60 mL/min (ref >60)
Glucose, Bld: 133 mg/dL — ABNORMAL HIGH (ref 70–99)
Potassium: 2.9 mmol/L — ABNORMAL LOW (ref 3.5–5.1)
Sodium: 139 mmol/L (ref 135–145)

## 2021-03-18 MED ORDER — POTASSIUM CHLORIDE CRYS ER 20 MEQ PO TBCR
40.0000 meq | EXTENDED_RELEASE_TABLET | Freq: Once | ORAL | Status: AC
  Administered 2021-03-18: 40 meq via ORAL
  Filled 2021-03-18: qty 2

## 2021-03-18 MED ORDER — ORITAVANCIN DIPHOSPHATE 400 MG IV SOLR
1200.0000 mg | Freq: Once | INTRAVENOUS | Status: AC
  Administered 2021-03-18: 1200 mg via INTRAVENOUS
  Filled 2021-03-18: qty 120

## 2021-03-18 MED ORDER — POTASSIUM CHLORIDE 10 MEQ/100ML IV SOLN
10.0000 meq | INTRAVENOUS | Status: DC
  Administered 2021-03-18 (×3): 10 meq via INTRAVENOUS
  Filled 2021-03-18 (×3): qty 100

## 2021-03-18 MED ORDER — HYDROCORTISONE NA SUCCINATE PF 100 MG IJ SOLR
50.0000 mg | Freq: Three times a day (TID) | INTRAMUSCULAR | Status: DC
  Administered 2021-03-19 (×2): 50 mg via INTRAVENOUS
  Filled 2021-03-18 (×2): qty 2

## 2021-03-18 NOTE — Progress Notes (Signed)
[facility] Infectious Diseases Follow Up Note  Patient Identification: Patient Name: Michele Green MRN: [id number] [place] Date: 03/14/2021  6:13 AM Age: 80 y.o.Today's Date: 03/18/2021   Reason for Visit: Follow-up on MRSA bacteremia  Principal Problem:   MRSA bacteremia Active Problems:   Essential hypertension   Atrial flutter with rapid ventricular response (HCC)   Goals of care, counseling/discussion   Adrenal insufficiency (Addison's disease) ([place])   Functional quadriplegia ([place])   Acute parotitis   Sepsis ([facility])   Multiple open wounds of lower extremity  Antibiotics: Ampicillin-sulbactam 4/20 3-4/24                    Cefepime 4/23                    Metronidazole 4/24                    Vancomycin 4/23-current  Lines/Tubes: PIV's,   Interval Events: Remains afebrile, leukocytosis has been resolved   Assessment MRSA bacteremia, rule out endocarditis 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 Blood cx 4/25 2/2 sets NG in 2 days  We will plan on 1 dose of oritavancin followed by 4 weeks of linezolid (to be started 10 days after dose of oritavancin) for empirically treating endocarditis if repeat blood cx are negative in 48 hrs  Continue metronidazole, plan for total 10 days  Monitor CBC at least every 2 weeks while on Linezolid Will sign off for now. Call us back if repeat blood cx are positive.    Rest of the management as per the primary team. Thank you for the consult. Please page with pertinent questions or concerns.  ______________________________________________________________________ Subjective patient seen and examined at  the bedside. She is sleeping comfortably and denies any pain /dsicomfort in her neck or bilateral cheeks   Vitals BP 114/78 (BP Location: Left Arm)   Pulse 84   Temp 98.1 F (36.7 C) (Oral)   Resp 17   Ht 5' 3"  (1.6 m)   Wt 51.9 kg   SpO2 99%   BMI 20.27 kg/m     Physical Exam Sleeping comfortably in bed No tenderness in bilateral parotid region or neck. Able to move neck without difficulty   Pertinent Microbiology Results for orders placed or performed during the hospital encounter of 03/14/21  Resp Panel by RT-PCR (Flu A&B, Covid) Nasopharyngeal Swab     Status: None   Collection Time: 03/14/21  6:31 AM   Specimen: Nasopharyngeal Swab; Nasopharyngeal(NP) swabs in vial transport medium  Result Value Ref Range Status   SARS Coronavirus 2 by RT PCR NEGATIVE NEGATIVE Final    Comment: (NOTE) SARS-CoV-2 target nucleic acids are NOT DETECTED.  The SARS-CoV-2 RNA is generally detectable in upper respiratory specimens during the acute phase of infection. The lowest concentration of SARS-CoV-2 viral copies this assay can detect is 138 copies/mL. A negative result does not preclude SARS-Cov-2 infection and should not be used as the sole basis for treatment or other patient management decisions. A negative result may occur with  improper specimen collection/handling, submission of specimen other than nasopharyngeal swab, presence of viral mutation(s) within the areas targeted by this assay, and inadequate number of viral copies(<138 copies/mL). A negative result  must be combined with clinical observations, patient history, and epidemiological information. The expected result is Negative.  Fact Sheet for Patients:  EntrepreneurPulse.com.au  Fact Sheet for Healthcare Providers:  IncredibleEmployment.be  This test is no t yet approved or cleared by the [place] FDA and  has been authorized for detection and/or diagnosis of SARS-CoV-2  by FDA under an Emergency Use Authorization (EUA). This EUA will remain  in effect (meaning this test can be used) for the duration of the COVID-19 declaration under Section 564(b)(1) of the Act, 21 U.S.C.section 360bbb-3(b)(1), unless the authorization is terminated  or revoked sooner.       Influenza A by PCR NEGATIVE NEGATIVE Final   Influenza B by PCR NEGATIVE NEGATIVE Final    Comment: (NOTE) The Xpert Xpress SARS-CoV-2/FLU/RSV plus assay is intended as an aid in the diagnosis of influenza from Nasopharyngeal swab specimens and should not be used as a sole basis for treatment. Nasal washings and aspirates are unacceptable for Xpert Xpress SARS-CoV-2/FLU/RSV testing.  Fact Sheet for Patients: EntrepreneurPulse.com.au  Fact Sheet for Healthcare Providers: IncredibleEmployment.be  This test is not yet approved or cleared by the [place] FDA and has been authorized for detection and/or diagnosis of SARS-CoV-2 by FDA under an Emergency Use Authorization (EUA). This EUA will remain in effect (meaning this test can be used) for the duration of the COVID-19 declaration under Section 564(b)(1) of the Act, 21 U.S.C. section 360bbb-3(b)(1), unless the authorization is terminated or revoked.  Performed at [facility]   Blood Culture (routine x 2)     Status: Abnormal   Collection Time: 03/14/21  6:31 AM   Specimen: BLOOD  Result Value Ref Range Status   Specimen Description   Final    BLOOD BLOOD LEFT FOREARM Performed at [facility]    Special Requests   Final    BOTTLES DRAWN AEROBIC AND ANAEROBIC Blood Culture adequate volume Performed at [facility]    Culture  Setup Time   Final    GRAM POSITIVE COCCI ANAEROBIC BOTTLE ONLY CRITICAL VALUE NOTED.  VALUE IS CONSISTENT WITH PREVIOUSLY REPORTED AND CALLED VALUE.    Culture (A)  Final    STAPHYLOCOCCUS AUREUS SUSCEPTIBILITIES  PERFORMED ON PREVIOUS CULTURE WITHIN THE LAST 5 DAYS. Performed at [facility], [place] [address] [place], [place] [address]    Report Status 03/17/2021 FINAL  Final  Group A Strep by PCR     Status: None   Collection Time: 03/14/21  6:31 AM   Specimen: Peripheral; Sterile Swab  Result Value Ref Range Status   Group A Strep by PCR NOT DETECTED NOT DETECTED Final    Comment: Performed at [facility]  Blood Culture (routine x 2)     Status: Abnormal   Collection Time: 03/14/21  6:36 AM   Specimen: BLOOD  Result Value Ref Range Status   Specimen Description   Final    BLOOD BLOOD LEFT WRIST Performed at [facility]    Special Requests   Final    BOTTLES DRAWN AEROBIC AND ANAEROBIC Blood Culture adequate volume Performed at [facility]    Culture  Setup Time   Final    GRAM POSITIVE COCCI AEROBIC BOTTLE ONLY CRITICAL RESULT CALLED TO, READ BACK BY AND VERIFIED WITH: Beulah Gandy 03/15/2021 at [place]ro Evert Performed at [facility], [place] [address], [place], [place] [address]    Culture METHICILLIN RESISTANT  STAPHYLOCOCCUS AUREUS (A)  Final   Report Status 03/17/2021 FINAL  Final   Organism ID, Bacteria METHICILLIN RESISTANT STAPHYLOCOCCUS AUREUS  Final      Susceptibility   Methicillin resistant staphylococcus aureus - MIC*    CIPROFLOXACIN >=8 RESISTANT Resistant     ERYTHROMYCIN >=8 RESISTANT Resistant     GENTAMICIN <=0.5 SENSITIVE Sensitive     OXACILLIN >=4 RESISTANT Resistant     TETRACYCLINE <=1 SENSITIVE Sensitive     VANCOMYCIN <=0.5 SENSITIVE Sensitive     TRIMETH/SULFA >=320 RESISTANT Resistant     CLINDAMYCIN <=0.25 SENSITIVE Sensitive     RIFAMPIN <=0.5 SENSITIVE Sensitive     Inducible Clindamycin NEGATIVE Sensitive     * METHICILLIN RESISTANT STAPHYLOCOCCUS AUREUS  Blood Culture ID Panel (Reflexed)     Status: Abnormal   Collection Time: 03/14/21  6:36 AM  Result Value Ref Range Status    Enterococcus faecalis NOT DETECTED NOT DETECTED Final   Enterococcus Faecium NOT DETECTED NOT DETECTED Final   Listeria monocytogenes NOT DETECTED NOT DETECTED Final   Staphylococcus species DETECTED (A) NOT DETECTED Final    Comment: CRITICAL RESULT CALLED TO, READ BACK BY AND VERIFIED WITH: PHARMD JAMES LEDFORD 03/15/2021 AT 0503 A.HUGHES    Staphylococcus aureus (BCID) DETECTED (A) NOT DETECTED Final    Comment: Methicillin (oxacillin)-resistant Staphylococcus aureus (MRSA). MRSA is predictably resistant to beta-lactam antibiotics (except ceftaroline). Preferred therapy is vancomycin unless clinically contraindicated. Patient requires contact precautions if  hospitalized. CRITICAL RESULT CALLED TO, READ BACK BY AND VERIFIED WITH: PHARMD JAMES LEDFORD 03/15/2021 AT 0503 A.HUGHES    Staphylococcus epidermidis NOT DETECTED NOT DETECTED Final   Staphylococcus lugdunensis NOT DETECTED NOT DETECTED Final   Streptococcus species NOT DETECTED NOT DETECTED Final   Streptococcus agalactiae NOT DETECTED NOT DETECTED Final   Streptococcus pneumoniae NOT DETECTED NOT DETECTED Final   Streptococcus pyogenes NOT DETECTED NOT DETECTED Final   A.calcoaceticus-baumannii NOT DETECTED NOT DETECTED Final   Bacteroides fragilis NOT DETECTED NOT DETECTED Final   Enterobacterales NOT DETECTED NOT DETECTED Final   Enterobacter cloacae complex NOT DETECTED NOT DETECTED Final   Escherichia coli NOT DETECTED NOT DETECTED Final   Klebsiella aerogenes NOT DETECTED NOT DETECTED Final   Klebsiella oxytoca NOT DETECTED NOT DETECTED Final   Klebsiella pneumoniae NOT DETECTED NOT DETECTED Final   Proteus species NOT DETECTED NOT DETECTED Final   Salmonella species NOT DETECTED NOT DETECTED Final   Serratia marcescens NOT DETECTED NOT DETECTED Final   Haemophilus influenzae NOT DETECTED NOT DETECTED Final   Neisseria meningitidis NOT DETECTED NOT DETECTED Final   Pseudomonas aeruginosa NOT DETECTED NOT DETECTED  Final   Stenotrophomonas maltophilia NOT DETECTED NOT DETECTED Final   Candida albicans NOT DETECTED NOT DETECTED Final   Candida auris NOT DETECTED NOT DETECTED Final   Candida glabrata NOT DETECTED NOT DETECTED Final   Candida krusei NOT DETECTED NOT DETECTED Final   Candida parapsilosis NOT DETECTED NOT DETECTED Final   Candida tropicalis NOT DETECTED NOT DETECTED Final   Cryptococcus neoformans/gattii NOT DETECTED NOT DETECTED Final   Meth resistant mecA/C and MREJ DETECTED (A) NOT DETECTED Final    Comment: CRITICAL RESULT CALLED TO, READ BACK BY AND VERIFIED WITH: PHARMD JAMES LEDFORD 03/15/2021 AT 0503 A.HUGHES Performed at [facility], [place] [address], [place], [place] [address]   Urine culture     Status: None   Collection Time: 03/14/21 10:44 AM   Specimen: In/Out Cath Urine  Result Value Ref Range Status  Specimen Description   Final    IN/OUT CATH URINE Performed at [facility]    Special Requests NONE Performed at [facility]   Final   Culture   Final    NO GROWTH Performed at [facility], [place] [address], [place], [place] [address]    Report Status 03/15/2021 FINAL  Final  MRSA PCR Screening     Status: Abnormal   Collection Time: 03/14/21  6:00 PM   Specimen: Nasopharyngeal  Result Value Ref Range Status   MRSA by PCR POSITIVE (A) NEGATIVE Final    Comment:        The GeneXpert MRSA Assay (FDA approved for NASAL specimens only), is one component of a comprehensive MRSA colonization surveillance program. It is not intended to diagnose MRSA infection nor to guide or monitor treatment for MRSA infections. RESULT CALLED TO, READ BACK BY AND VERIFIED WITH: K,HOLDEN RN @2022  03/14/21 EB Performed at [facility] [address], [place], [place] [address]   Respiratory (~20 pathogens) panel by PCR     Status: None   Collection Time: 03/14/21  6:30 PM  Result Value Ref Range Status   Adenovirus NOT  DETECTED NOT DETECTED Final   Coronavirus 229E NOT DETECTED NOT DETECTED Final    Comment: (NOTE) The Coronavirus on the Respiratory Panel, DOES NOT test for the novel  Coronavirus (2019 nCoV)    Coronavirus HKU1 NOT DETECTED NOT DETECTED Final   Coronavirus NL63 NOT DETECTED NOT DETECTED Final   Coronavirus OC43 NOT DETECTED NOT DETECTED Final   Metapneumovirus NOT DETECTED NOT DETECTED Final   Rhinovirus / Enterovirus NOT DETECTED NOT DETECTED Final   Influenza A NOT DETECTED NOT DETECTED Final   Influenza B NOT DETECTED NOT DETECTED Final   Parainfluenza Virus 1 NOT DETECTED NOT DETECTED Final   Parainfluenza Virus 2 NOT DETECTED NOT DETECTED Final   Parainfluenza Virus 3 NOT DETECTED NOT DETECTED Final   Parainfluenza Virus 4 NOT DETECTED NOT DETECTED Final   Respiratory Syncytial Virus NOT DETECTED NOT DETECTED Final   Bordetella pertussis NOT DETECTED NOT DETECTED Final   Bordetella Parapertussis NOT DETECTED NOT DETECTED Final   Chlamydophila pneumoniae NOT DETECTED NOT DETECTED Final   Mycoplasma pneumoniae NOT DETECTED NOT DETECTED Final    Comment: Performed at [facility], [place]. [address] [place], [place] [address]  C Difficile Quick Screen w PCR reflex     Status: None   Collection Time: 03/15/21  6:00 PM   Specimen: STOOL  Result Value Ref Range Status   C Diff antigen NEGATIVE NEGATIVE Final   C Diff toxin NEGATIVE NEGATIVE Final   C Diff interpretation No C. difficile detected.  Final    Comment: Performed at [facility], [place] [address] [place], [place] [address]  Culture, blood (routine x 2)     Status: None (Preliminary result)   Collection Time: 03/16/21  9:28 AM   Specimen: BLOOD  Result Value Ref Range Status   Specimen Description BLOOD RIGHT ANTECUBITAL  Final   Special Requests   Final    BOTTLES DRAWN AEROBIC AND ANAEROBIC Blood Culture adequate volume   Culture   Final    NO GROWTH < 24 HOURS Performed at [facility], [place]  [address] [place], [place] [address]    Report Status PENDING  Incomplete  Culture, blood (routine x 2)     Status: None (Preliminary result)   Collection  Time: 03/16/21  9:28 AM   Specimen: BLOOD  Result Value Ref Range Status   Specimen Description BLOOD BLOOD RIGHT HAND  Final   Special Requests   Final    BOTTLES DRAWN AEROBIC AND ANAEROBIC Blood Culture results may not be optimal due to an inadequate volume of blood received in culture bottles   Culture   Final    NO GROWTH < 24 HOURS Performed at [facility], [place] [address], [place], [place] [address]    Report Status PENDING  Incomplete    Pertinent Lab. CBC Latest Ref Rng & Units 03/18/2021 03/15/2021 03/14/2021  WBC 4.0 - 10.5 K/uL 8.8 24.9(H) 33.4(H)  Hemoglobin 12.0 - 15.0 g/dL 9.5(L) 10.1(L) 12.9  Hematocrit 36.0 - 46.0 % 28.7(L) 30.5(L) 38.5  Platelets 150 - 400 K/uL 172 153 219   CMP Latest Ref Rng & Units 03/18/2021 03/15/2021 03/14/2021  Glucose 70 - 99 mg/dL 133(H) 116(H) 136(H)  BUN 8 - 23 mg/dL 21 16 16   Creatinine 0.44 - 1.00 mg/dL 0.67 1.01(H) 1.12(H)  Sodium 135 - 145 mmol/L 139 134(L) 136  Potassium 3.5 - 5.1 mmol/L 2.9(L) 3.6 4.3  Chloride 98 - 111 mmol/L 104 103 96(L)  CO2 22 - 32 mmol/L 25 23 20(L)  Calcium 8.9 - 10.3 mg/dL 8.0(L) 7.3(L) 8.7(L)  Total Protein 6.5 - 8.1 g/dL - - 6.3(L)  Total Bilirubin 0.3 - 1.2 mg/dL - - 1.1  Alkaline Phos 38 - 126 U/L - - 58  AST 15 - 41 U/L - - 16  ALT 0 - 44 U/L - - 11     Pertinent Imaging today Plain films and CT images have been personally visualized and interpreted; radiology reports have been reviewed. Decision making incorporated into the Impression / Recommendations.  I have spent approx 30 minutes for this patient encounter including review of prior medical records, coordination of care  with greater than 50% of time being face to face/counseling and discussing diagnostics/treatment plan with the patient/family.  Electronically signed by:   Rosiland Oz, MD Infectious Disease Physician [facility] for Infectious Disease Pager: [phone]

## 2021-03-18 NOTE — Progress Notes (Signed)
TR[facility] night shift.  The nursing staff communicated that the potassium infusion had to be held because the patient is having a burning sensation at the site of the catheter and are requesting oral KCl supplementation instead.  KCl 40 mEq p.o. x1 ordered.  Tennis Must, MD

## 2021-03-18 NOTE — Progress Notes (Signed)
Michele Green [id number] Hospice Patient [facility])  M[facility] Green [facility]. Facility notified [facility] on callstaffthat patient was experiencing swelling on therightside of herneck withassociatedpain. After assessment and discussing with patient's son, the decision was made to have EMS transport her to ED for evaluation. Patient was admitted to [facility] on 4/23 with a diagnosis ofSepsis due to acute parotiditis and MRSA bacteremia.Per [facility] physician Dr. Gilford Rile, this is a related admission.  Visited patient at the bedside. Pt AOx3, some confusion noted. Pt was beingcleaned from an episdoe of diarrhea, reported 2-3 episdoes today.  Verified with nurse tech confirmed two episodes of "runny" diarrhea today.  Pt reports pain in R heel, reports getting relief from current pain regime.  Spoke with son Michele Green by phone who is waiting to hear back from [facility] about increasing care there to 6/6.  He shared concern with report of diarrhea given pt's hx of UC.  Dr. Darrick Meigs made aware via secure chat. Pt's son would like to stay with hospice services and continue oral antibiotics when she is able to discharge.  V/S: 98.6 oral, 118/75, 81 HR, 16 RR, 99% RA I&O: 2042.5/700 Labs: K 2.9, Ca 8.0, Hgb 9.5 Diagnostics: no new diagnostics IVs/PRNs: solu-cortef 50 mg IV q6h, NS @ 50 ml/hr (dc'ed at 1217 on 4/27), oritavancin 1242m PIV  Problem List: - sepsis due to acute parodititis and MRSA bacteremia - currently on flagyl 500 mg PO TID, getting one time dose of oritavancin PIV today which lasts for two weeks, then transition to linezolid PO - a. Fib/a. Flutter - was on diltiazem drip > now discontinued, on xarelto and digoxin  GOC: clear, focus on comfort but treat what can be treated D/C planning: return to ALF. Son contacted ALF to discuss increasing her level of assistance to a  6/6, he advised they need to come to re-evaluate her to make sure they can provide the level of care she is needing. She will be prescribed linezolid for 4 weeks and need a CBC every two weeks.  IDT: hospice team updated Family: updated Michele Green via phone   Thank you for the opportunity to participate in this patient's care.     CDomenic Moras BSN, RN APris[facility] HospitalLiaison ([facility])    [phone](24h on call)

## 2021-03-18 NOTE — Progress Notes (Addendum)
[facility]  PROGRESS NOTE  Michele Green YCX:[id number] DOB: 18-Aug-1941 DOA: 03/14/2021 PCP: [name], MD   Brief HPI:   80 year old female with history of ulcerative colitis, chronic loose stools, paroxysmal atrial fibrillation on Xarelto and digoxin s/p maze procedure, CAD s/p CABG, chronic diastolic CHF, Addison's disease, breast cancer, COVID infection in January 2022, hypertension, hyperlipidemia presented to ED with neck pain.  Patient developed right lateral neck and face pain and presented to [facility], [place], found to have SIRS criteria.  Code sepsis was activated.  Patient is on home hospice due to end-stage hypertensive heart disease.  CT soft tissue neck with contrast showed acute parotitis.  No abscess or obstructing process.  Secondary inflammation within the right neck and pharynx.    Subjective   Patient seen and examined, had 2 loose BMs today.  She has history of ulcerative colitis and is on mesalamine.   Assessment/Plan:     1. Sepsis due to acute parotitis/MRSA bacteremia-patient presented with leukocytosis, WBC 30,000, fever tachycardia and tachypnea.  Initially started on Unasyn and was switched to IV daptomycin and p.o. Flagyl on 03/15/2021 due to MRSA bacteremia.  2D echo showed no endocarditis.  Repeat blood cultures as of 4/25 are negative to date.  ID was consulted.  If blood cultures remain negative, ID plans to give 1 dose of oritavancin followed by 4 weeks of linezolid, to be started 10 days after dose of oritavancin, for empirically treating endocarditis.  Continue metronidazole for total 10 days.  Monitor CBC for at least every 2 weeks while being on the linezolid. 2. MRSA bacteremia-likely from above.  Patient on antibiotics as above.  Continue daptomycin and p.o. Flagyl.  Plan for total 6 weeks of antibiotics.  WBC is down to 8.8. 3. History of relative adrenal insufficiency-patient was started on Solu-Cortef 50 mg IV every 6 hours as stress dose  steroids on 03/14/2021.  We will cut down the dose of Solu-Cortef to 50 mg IV every 8 hours and taper off over the next few days.  Patient is not taking steroids at home. 4. Hypokalemia-potassium is 2.9, will replace potassium and follow BMP in am. 5. Chronic atrial flutter-continue diltiazem, digoxin.  Xarelto for anticoagulation. 6. Leg wounds-POA-continue local wound care. 7. Diarrhea-patient has history of ulcerative colitis.  Also getting antibiotics.  Continue mesalamine.  Will monitor.  Patient had 2 loose BMs today. 8. Hypertension-continue diltiazem to 40 mg daily, metoprolol 12.5 mg p.o. twice daily 9. History of breast cancer-patient no longer on tamoxifen. 10. Goals of care-patient is under hospice care, plan to discharge back to assisted living facility with hospice.   Scheduled medications:   . Chlorhexidine Gluconate Cloth  6 each Topical Q0600  . collagenase   Topical Daily  . digoxin  0.0625 mg Oral Daily  . diltiazem  120 mg Oral Daily  . escitalopram  10 mg Oral Daily  . feeding supplement  237 mL Oral BID BM  . hydrocortisone sod succinate (SOLU-CORTEF) inj  50 mg Intravenous Q6H  . hydrOXYzine  25 mg Oral BID  . Mesalamine  400 mg Oral Daily  . metoprolol tartrate  12.5 mg Oral BID  . metroNIDAZOLE  500 mg Oral Q8H  . mupirocin ointment  1 application Nasal BID  . Rivaroxaban  15 mg Oral QHS  . sodium chloride flush  3 mL Intravenous Q12H         Data Reviewed:   CBG:  Recent Labs  Lab 03/14/21 1042  GLUCAP 157*    SpO2: 98 % O2 Flow Rate (L/min): 2 L/min    Vitals:   03/18/21 0843 03/18/21 1004 03/18/21 1148 03/18/21 1602  BP: 112/78  118/75 111/65  Pulse: 79 81 81 79  Resp: 15  16 15   Temp: 98.4 F (36.9 C)  98.7 F (37.1 C) 98.6 F (37 C)  TempSrc: Oral  Oral Oral  SpO2: 99%  99% 98%  Weight:      Height:         Intake/Output Summary (Last 24 hours) at 03/18/2021 1723 Last data filed at 03/18/2021 1553 Gross per 24 hour  Intake  3648.58 ml  Output 601 ml  Net 3047.58 ml    04/25 1901 - 04/27 0700 In: 2710.3 [P.O.:822; I.V.:1830.3] Out: 700 [Urine:700]  Filed Weights   03/14/21 1850 03/16/21 0330 03/18/21 0505  Weight: 49 kg 49.3 kg 51.9 kg    CBC:  Recent Labs  Lab 03/14/21 0631 03/15/21 0657 03/18/21 0437  WBC 33.4* 24.9* 8.8  HGB 12.9 10.1* 9.5*  HCT 38.5 30.5* 28.7*  PLT 219 153 172  MCV 90.4 91.9 90.5  MCH 30.3 30.4 30.0  MCHC 33.5 33.1 33.1  RDW 15.3 15.5 15.3  LYMPHSABS 0.9  --   --   MONOABS 1.4*  --   --   EOSABS 0.0  --   --   BASOSABS 0.1  --   --     Complete metabolic panel:  Recent Labs  Lab 03/14/21 0631 03/14/21 0831 03/15/21 0657 03/18/21 0437  NA 136  --  134* 139  K 4.3  --  3.6 2.9*  CL 96*  --  103 104  CO2 20*  --  23 25  GLUCOSE 136*  --  116* 133*  BUN 16  --  16 21  CREATININE 1.12*  --  1.01* 0.67  CALCIUM 8.7*  --  7.3* 8.0*  AST 16  --   --   --   ALT 11  --   --   --   ALKPHOS 58  --   --   --   BILITOT 1.1  --   --   --   ALBUMIN 3.0*  --   --   --   LATICACIDVEN 3.2* 1.2  --   --   INR 1.3*  --   --   --     No results for input(s): LIPASE, AMYLASE in the last 168 hours.  Recent Labs  Lab 03/14/21 0631  SARSCOV2NAA NEGATIVE    ------------------------------------------------------------------------------------------------------------------ No results for input(s): CHOL, HDL, LDLCALC, TRIG, CHOLHDL, LDLDIRECT in the last 72 hours.  Lab Results  Component Value Date   HGBA1C 4.6 (L) 08/07/2018   ------------------------------------------------------------------------------------------------------------------ No results for input(s): TSH, T4TOTAL, T3FREE, THYROIDAB in the last 72 hours.  Invalid input(s): FREET3 ------------------------------------------------------------------------------------------------------------------ No results for input(s): VITAMINB12, FOLATE, FERRITIN, TIBC, IRON, RETICCTPCT in the last 72  hours.  Coagulation profile  Recent Labs  Lab 03/14/21 0631  INR 1.3*    No results for input(s): DDIMER in the last 72 hours.  Cardiac Enzymes  No results for input(s): CKMB, TROPONINI, MYOGLOBIN in the last 168 hours.  Invalid input(s): CK ------------------------------------------------------------------------------------------------------------------    Component Value Date/Time   BNP 478.7 (H) 01/24/2021 1610     Antibiotics: Anti-infectives (From admission, onward)   Start     Dose/Rate Route Frequency Ordered Stop   03/18/21 1000  Oritavancin Diphosphate (ORBACTIV) 1,200 mg in dextrose 5 % IVPB  1,200 mg 333.3 mL/hr over 180 Minutes Intravenous Once 03/18/21 0841 03/18/21 1335   03/17/21 2000  DAPTOmycin (CUBICIN) 400 mg in sodium chloride 0.9 % IVPB  Status:  Discontinued        8 mg/kg  49.3 kg 116 mL/hr over 30 Minutes Intravenous Daily 03/16/21 1023 03/18/21 0841   03/16/21 0800  vancomycin (VANCOREADY) IVPB 1000 mg/200 mL  Status:  Discontinued        1,000 mg 200 mL/hr over 60 Minutes Intravenous Every 48 hours 03/15/21 0538 03/16/21 1023   03/16/21 0600  vancomycin (VANCOREADY) IVPB 500 mg/100 mL  Status:  Discontinued        500 mg 100 mL/hr over 60 Minutes Intravenous Every 48 hours 03/14/21 0757 03/14/21 1657   03/15/21 1400  metroNIDAZOLE (FLAGYL) tablet 500 mg        500 mg Oral Every 8 hours 03/15/21 1119     03/15/21 0800  ceFEPIme (MAXIPIME) 2 g in sodium chloride 0.9 % 100 mL IVPB  Status:  Discontinued        2 g 200 mL/hr over 30 Minutes Intravenous Every 24 hours 03/14/21 0757 03/14/21 1657   03/14/21 1800  Ampicillin-Sulbactam (UNASYN) 3 g in sodium chloride 0.9 % 100 mL IVPB  Status:  Discontinued        3 g 200 mL/hr over 30 Minutes Intravenous Every 8 hours 03/14/21 1706 03/15/21 1117   03/14/21 1745  Ampicillin-Sulbactam (UNASYN) 3 g in sodium chloride 0.9 % 100 mL IVPB  Status:  Discontinued        3 g 200 mL/hr over 30 Minutes  Intravenous Every 6 hours 03/14/21 1657 03/14/21 1705   03/14/21 0645  ceFEPIme (MAXIPIME) 2 g in sodium chloride 0.9 % 100 mL IVPB        2 g 200 mL/hr over 30 Minutes Intravenous  Once 03/14/21 0632 03/14/21 1512   03/14/21 0645  metroNIDAZOLE (FLAGYL) IVPB 500 mg        500 mg 100 mL/hr over 60 Minutes Intravenous  Once 03/14/21 0632 03/14/21 1512   03/14/21 0645  vancomycin (VANCOCIN) IVPB 1000 mg/200 mL premix        1,000 mg 200 mL/hr over 60 Minutes Intravenous  Once 03/14/21 5366 03/14/21 1512       Radiology Reports  No results found.    DVT prophylaxis: Xarelto  Code Status: DNR  Family Communication: No family at bedside   Consultants:    Procedures:      Objective    Physical Examination:    General-appears in no acute distress  Heart-S1-S2, regular, no murmur auscultated  Lungs-clear to auscultation bilaterally, no wheezing or crackles auscultated  Abdomen-soft, nontender, no organomegaly  Extremities-no edema in the lower extremities  Neuro-alert, oriented x3, no focal deficit noted   Status is: Inpatient  Dispo: The patient is from: Home              Anticipated d/c is to: Skilled nursing facility              Anticipated d/c date is: 03/20/2021              Patient currently not stable for discharge  Barrier to discharge-IV antibiotics for MRSA bacteremia  COVID-19 Labs  No results for input(s): DDIMER, FERRITIN, LDH, CRP in the last 72 hours.  Lab Results  Component Value Date   SARSCOV2NAA NEGATIVE 03/14/2021   [place] NEGATIVE 02/02/2021   [place] NEGATIVE 01/24/2021   [place] NEGATIVE 01/20/2021  Microbiology  Recent Results (from the past 240 hour(s))  Resp Panel by RT-PCR (Flu A&B, Covid) Nasopharyngeal Swab     Status: None   Collection Time: 03/14/21  6:31 AM   Specimen: Nasopharyngeal Swab; Nasopharyngeal(NP) swabs in vial transport medium  Result Value Ref Range Status   SARS Coronavirus 2 by  RT PCR NEGATIVE NEGATIVE Final    Comment: (NOTE) SARS-CoV-2 target nucleic acids are NOT DETECTED.  The SARS-CoV-2 RNA is generally detectable in upper respiratory specimens during the acute phase of infection. The lowest concentration of SARS-CoV-2 viral copies this assay can detect is 138 copies/mL. A negative result does not preclude SARS-Cov-2 infection and should not be used as the sole basis for treatment or other patient management decisions. A negative result may occur with  improper specimen collection/handling, submission of specimen other than nasopharyngeal swab, presence of viral mutation(s) within the areas targeted by this assay, and inadequate number of viral copies(<138 copies/mL). A negative result must be combined with clinical observations, patient history, and epidemiological information. The expected result is Negative.  Fact Sheet for Patients:  EntrepreneurPulse.com.au  Fact Sheet for Healthcare Providers:  IncredibleEmployment.be  This test is no t yet approved or cleared by the [place] FDA and  has been authorized for detection and/or diagnosis of SARS-CoV-2 by FDA under an Emergency Use Authorization (EUA). This EUA will remain  in effect (meaning this test can be used) for the duration of the COVID-19 declaration under Section 564(b)(1) of the Act, 21 U.S.C.section 360bbb-3(b)(1), unless the authorization is terminated  or revoked sooner.       Influenza A by PCR NEGATIVE NEGATIVE Final   Influenza B by PCR NEGATIVE NEGATIVE Final    Comment: (NOTE) The Xpert Xpress SARS-CoV-2/FLU/RSV plus assay is intended as an aid in the diagnosis of influenza from Nasopharyngeal swab specimens and should not be used as a sole basis for treatment. Nasal washings and aspirates are unacceptable for Xpert Xpress SARS-CoV-2/FLU/RSV testing.  Fact Sheet for Patients: EntrepreneurPulse.com.au  Fact Sheet  for Healthcare Providers: IncredibleEmployment.be  This test is not yet approved or cleared by the [place] FDA and has been authorized for detection and/or diagnosis of SARS-CoV-2 by FDA under an Emergency Use Authorization (EUA). This EUA will remain in effect (meaning this test can be used) for the duration of the COVID-19 declaration under Section 564(b)(1) of the Act, 21 U.S.C. section 360bbb-3(b)(1), unless the authorization is terminated or revoked.  Performed at [facility]   Blood Culture (routine x 2)     Status: Abnormal   Collection Time: 03/14/21  6:31 AM   Specimen: BLOOD  Result Value Ref Range Status   Specimen Description   Final    BLOOD BLOOD LEFT FOREARM Performed at [facility]    Special Requests   Final    BOTTLES DRAWN AEROBIC AND ANAEROBIC Blood Culture adequate volume Performed at [facility]    Culture  Setup Time   Final    GRAM POSITIVE COCCI ANAEROBIC BOTTLE ONLY CRITICAL VALUE NOTED.  VALUE IS CONSISTENT WITH PREVIOUSLY REPORTED AND CALLED VALUE.    Culture (A)  Final    STAPHYLOCOCCUS AUREUS SUSCEPTIBILITIES PERFORMED ON PREVIOUS CULTURE WITHIN THE LAST 5 DAYS. Performed at [facility], [place] [address] [place], [place] [address]    Report Status 03/17/2021 FINAL  Final  Group A Strep by PCR     Status: None   Collection Time: 03/14/21  6:31  AM   Specimen: Peripheral; Sterile Swab  Result Value Ref Range Status   Group A Strep by PCR NOT DETECTED NOT DETECTED Final    Comment: Performed at [facility]  Blood Culture (routine x 2)     Status: Abnormal   Collection Time: 03/14/21  6:36 AM   Specimen: BLOOD  Result Value Ref Range Status   Specimen Description   Final    BLOOD BLOOD LEFT WRIST Performed at [facility]    Special Requests   Final    BOTTLES DRAWN AEROBIC AND ANAEROBIC Blood Culture adequate  volume Performed at [facility]    Culture  Setup Time   Final    GRAM POSITIVE COCCI AEROBIC BOTTLE ONLY CRITICAL RESULT CALLED TO, READ BACK BY AND VERIFIED WITH: PHARMD Narda Bonds 03/15/2021 at [email or web address] Evert Performed at [facility], [place] [address] [place], [place] [address]    Culture METHICILLIN RESISTANT STAPHYLOCOCCUS AUREUS (A)  Final   Report Status 03/17/2021 FINAL  Final   Organism ID, Bacteria METHICILLIN RESISTANT STAPHYLOCOCCUS AUREUS  Final      Susceptibility   Methicillin resistant staphylococcus aureus - MIC*    CIPROFLOXACIN >=8 RESISTANT Resistant     ERYTHROMYCIN >=8 RESISTANT Resistant     GENTAMICIN <=0.5 SENSITIVE Sensitive     OXACILLIN >=4 RESISTANT Resistant     TETRACYCLINE <=1 SENSITIVE Sensitive     VANCOMYCIN <=0.5 SENSITIVE Sensitive     TRIMETH/SULFA >=320 RESISTANT Resistant     CLINDAMYCIN <=0.25 SENSITIVE Sensitive     RIFAMPIN <=0.5 SENSITIVE Sensitive     Inducible Clindamycin NEGATIVE Sensitive     * METHICILLIN RESISTANT STAPHYLOCOCCUS AUREUS  Blood Culture ID Panel (Reflexed)     Status: Abnormal   Collection Time: 03/14/21  6:36 AM  Result Value Ref Range Status   Enterococcus faecalis NOT DETECTED NOT DETECTED Final   Enterococcus Faecium NOT DETECTED NOT DETECTED Final   Listeria monocytogenes NOT DETECTED NOT DETECTED Final   Staphylococcus species DETECTED (A) NOT DETECTED Final    Comment: CRITICAL RESULT CALLED TO, READ BACK BY AND VERIFIED WITH: PHARMD JAMES LEDFORD 03/15/2021 AT 0503 A.HUGHES    Staphylococcus aureus (BCID) DETECTED (A) NOT DETECTED Final    Comment: Methicillin (oxacillin)-resistant Staphylococcus aureus (MRSA). MRSA is predictably resistant to beta-lactam antibiotics (except ceftaroline). Preferred therapy is vancomycin unless clinically contraindicated. Patient requires contact precautions if  hospitalized. CRITICAL RESULT CALLED TO, READ BACK BY AND VERIFIED WITH: PHARMD JAMES  LEDFORD 03/15/2021 AT 0503 A.HUGHES    Staphylococcus epidermidis NOT DETECTED NOT DETECTED Final   Staphylococcus lugdunensis NOT DETECTED NOT DETECTED Final   Streptococcus species NOT DETECTED NOT DETECTED Final   Streptococcus agalactiae NOT DETECTED NOT DETECTED Final   Streptococcus pneumoniae NOT DETECTED NOT DETECTED Final   Streptococcus pyogenes NOT DETECTED NOT DETECTED Final   A.calcoaceticus-baumannii NOT DETECTED NOT DETECTED Final   Bacteroides fragilis NOT DETECTED NOT DETECTED Final   Enterobacterales NOT DETECTED NOT DETECTED Final   Enterobacter cloacae complex NOT DETECTED NOT DETECTED Final   Escherichia coli NOT DETECTED NOT DETECTED Final   Klebsiella aerogenes NOT DETECTED NOT DETECTED Final   Klebsiella oxytoca NOT DETECTED NOT DETECTED Final   Klebsiella pneumoniae NOT DETECTED NOT DETECTED Final   Proteus species NOT DETECTED NOT DETECTED Final   Salmonella species NOT DETECTED NOT DETECTED Final   Serratia marcescens NOT DETECTED NOT DETECTED Final   Haemophilus influenzae NOT DETECTED NOT DETECTED Final  Neisseria meningitidis NOT DETECTED NOT DETECTED Final   Pseudomonas aeruginosa NOT DETECTED NOT DETECTED Final   Stenotrophomonas maltophilia NOT DETECTED NOT DETECTED Final   Candida albicans NOT DETECTED NOT DETECTED Final   Candida auris NOT DETECTED NOT DETECTED Final   Candida glabrata NOT DETECTED NOT DETECTED Final   Candida krusei NOT DETECTED NOT DETECTED Final   Candida parapsilosis NOT DETECTED NOT DETECTED Final   Candida tropicalis NOT DETECTED NOT DETECTED Final   Cryptococcus neoformans/gattii NOT DETECTED NOT DETECTED Final   Meth resistant mecA/C and MREJ DETECTED (A) NOT DETECTED Final    Comment: CRITICAL RESULT CALLED TO, READ BACK BY AND VERIFIED WITH: PHARMD JAMES LEDFORD 03/15/2021 AT 0503 A.HUGHES Performed at [facility], [place] [address], [place], [place] [address]   Urine culture     Status: None   Collection Time:  03/14/21 10:44 AM   Specimen: In/Out Cath Urine  Result Value Ref Range Status   Specimen Description   Final    IN/OUT CATH URINE Performed at [facility]    Special Requests NONE Performed at [facility]   Final   Culture   Final    NO GROWTH Performed at [facility], [place] [address], [place], [place] [address]    Report Status 03/15/2021 FINAL  Final  MRSA PCR Screening     Status: Abnormal   Collection Time: 03/14/21  6:00 PM   Specimen: Nasopharyngeal  Result Value Ref Range Status   MRSA by PCR POSITIVE (A) NEGATIVE Final    Comment:        The GeneXpert MRSA Assay (FDA approved for NASAL specimens only), is one component of a comprehensive MRSA colonization surveillance program. It is not intended to diagnose MRSA infection nor to guide or monitor treatment for MRSA infections. RESULT CALLED TO, READ BACK BY AND VERIFIED WITH: K,HOLDEN RN @2022  03/14/21 EB Performed at [facility] [address], [place], [place] [address]   Respiratory (~20 pathogens) panel by PCR     Status: None   Collection Time: 03/14/21  6:30 PM  Result Value Ref Range Status   Adenovirus NOT DETECTED NOT DETECTED Final   Coronavirus 229E NOT DETECTED NOT DETECTED Final    Comment: (NOTE) The Coronavirus on the Respiratory Panel, DOES NOT test for the novel  Coronavirus (2019 nCoV)    Coronavirus HKU1 NOT DETECTED NOT DETECTED Final   Coronavirus NL63 NOT DETECTED NOT DETECTED Final   Coronavirus OC43 NOT DETECTED NOT DETECTED Final   Metapneumovirus NOT DETECTED NOT DETECTED Final   Rhinovirus / Enterovirus NOT DETECTED NOT DETECTED Final   Influenza A NOT DETECTED NOT DETECTED Final   Influenza B NOT DETECTED NOT DETECTED Final   Parainfluenza Virus 1 NOT DETECTED NOT DETECTED Final   Parainfluenza Virus 2 NOT DETECTED NOT DETECTED Final   Parainfluenza Virus 3 NOT DETECTED NOT DETECTED Final   Parainfluenza Virus 4 NOT DETECTED NOT  DETECTED Final   Respiratory Syncytial Virus NOT DETECTED NOT DETECTED Final   Bordetella pertussis NOT DETECTED NOT DETECTED Final   Bordetella Parapertussis NOT DETECTED NOT DETECTED Final   Chlamydophila pneumoniae NOT DETECTED NOT DETECTED Final   Mycoplasma pneumoniae NOT DETECTED NOT DETECTED Final    Comment: Performed at [facility], [place]. [address] [place], [place] [address]  C Difficile Quick Screen w PCR reflex     Status: None   Collection Time: 03/15/21  6:00 PM  Specimen: STOOL  Result Value Ref Range Status   C Diff antigen NEGATIVE NEGATIVE Final   C Diff toxin NEGATIVE NEGATIVE Final   C Diff interpretation No C. difficile detected.  Final    Comment: Performed at [facility], [place] [address] [place], [place] [address]  Culture, blood (routine x 2)     Status: None (Preliminary result)   Collection Time: 03/16/21  9:28 AM   Specimen: BLOOD  Result Value Ref Range Status   Specimen Description BLOOD RIGHT ANTECUBITAL  Final   Special Requests   Final    BOTTLES DRAWN AEROBIC AND ANAEROBIC Blood Culture adequate volume   Culture   Final    NO GROWTH 2 DAYS Performed at [facility], [place] [address], [place], [place] [address]    Report Status PENDING  Incomplete  Culture, blood (routine x 2)     Status: None (Preliminary result)   Collection Time: 03/16/21  9:28 AM   Specimen: BLOOD  Result Value Ref Range Status   Specimen Description BLOOD BLOOD RIGHT HAND  Final   Special Requests   Final    BOTTLES DRAWN AEROBIC AND ANAEROBIC Blood Culture results may not be optimal due to an inadequate volume of blood received in culture bottles   Culture   Final    NO GROWTH 2 DAYS Performed at [facility], [place] [address] [place], [place] [address]    Report Status PENDING  Incomplete  Culture, blood (routine x 2)     Status: None (Preliminary result)   Collection Time: 03/17/21  3:12 AM   Specimen: BLOOD LEFT HAND  Result Value Ref Range  Status   Specimen Description BLOOD LEFT HAND  Final   Special Requests   Final    BOTTLES DRAWN AEROBIC ONLY Blood Culture adequate volume   Culture   Final    NO GROWTH 1 DAY Performed at [facility], [place] [address], [place], [place] [address]    Report Status PENDING  Incomplete  Culture, blood (routine x 2)     Status: None (Preliminary result)   Collection Time: 03/17/21  3:13 AM   Specimen: BLOOD  Result Value Ref Range Status   Specimen Description BLOOD SITE NOT SPECIFIED  Final   Special Requests   Final    BOTTLES DRAWN AEROBIC ONLY Blood Culture adequate volume   Culture   Final    NO GROWTH 1 DAY Performed at [facility], [place] [address] [place],  [address]    Report Status PENDING  Incomplete    Pressure Injury 03/14/21 Heel Right Unstageable - Full thickness tissue loss in which the base of the injury is covered by slough (yellow, tan, gray, green or brown) and/or eschar (tan, brown or black) in the wound bed. Yellow Slough visible; WOC updated (Active)  03/14/21 1630  Location: Heel  Location Orientation: Right  Staging: Unstageable - Full thickness tissue loss in which the base of the injury is covered by slough (yellow, tan, gray, green or brown) and/or eschar (tan, brown or black) in the wound bed.  Wound Description (Comments): Yellow Slough visible; WOC updated to Unstagable slough covering wound base.  Present on Admission: Yes          [place]   [facility] If 7PM-7AM, please contact night-coverage at [email or web address], Office  [phone]   03/18/2021, 5:23 PM  LOS: 4 days

## 2021-03-19 LAB — BASIC METABOLIC PANEL
Anion gap: 9 (ref 5–15)
BUN: 21 mg/dL (ref 8–23)
CO2: 23 mmol/L (ref 22–32)
Calcium: 8.1 mg/dL — ABNORMAL LOW (ref 8.9–10.3)
Chloride: 103 mmol/L (ref 98–111)
Creatinine, Ser: 0.72 mg/dL (ref 0.44–1.00)
GFR, Estimated: >60 mL/min (ref >60)
Glucose, Bld: 124 mg/dL — ABNORMAL HIGH (ref 70–99)
Potassium: 3.4 mmol/L — ABNORMAL LOW (ref 3.5–5.1)
Sodium: 135 mmol/L (ref 135–145)

## 2021-03-19 LAB — CBC
HCT: 31.3 % — ABNORMAL LOW (ref 36.0–46.0)
Hemoglobin: 10.4 g/dL — ABNORMAL LOW (ref 12.0–15.0)
MCH: 30.1 pg (ref 26.0–34.0)
MCHC: 33.2 g/dL (ref 30.0–36.0)
MCV: 90.5 fL (ref 80.0–100.0)
Platelets: 186 10*3/uL (ref 150–400)
RBC: 3.46 MIL/uL — ABNORMAL LOW (ref 3.87–5.11)
RDW: 15.4 % (ref 11.5–15.5)
WBC: 14 10*3/uL — ABNORMAL HIGH (ref 4.0–10.5)
nRBC: 0 % (ref 0.0–0.2)

## 2021-03-19 LAB — MAGNESIUM: Magnesium: 1.5 mg/dL — ABNORMAL LOW (ref 1.7–2.4)

## 2021-03-19 MED ORDER — MAGNESIUM SULFATE 2 GM/50ML IV SOLN
2.0000 g | Freq: Once | INTRAVENOUS | Status: AC
  Administered 2021-03-19: 2 g via INTRAVENOUS
  Filled 2021-03-19: qty 50

## 2021-03-19 MED ORDER — POTASSIUM CHLORIDE CRYS ER 20 MEQ PO TBCR
20.0000 meq | EXTENDED_RELEASE_TABLET | Freq: Once | ORAL | Status: AC
  Administered 2021-03-19: 20 meq via ORAL
  Filled 2021-03-19: qty 1

## 2021-03-19 MED ORDER — CLONAZEPAM 0.5 MG PO TABS
0.5000 mg | ORAL_TABLET | Freq: Once | ORAL | Status: AC
  Administered 2021-03-19: 0.5 mg via ORAL

## 2021-03-19 MED ORDER — SODIUM CHLORIDE 0.9 % IV SOLN: INTRAVENOUS | Status: DC

## 2021-03-19 MED ORDER — PREDNISONE 20 MG PO TABS
40.0000 mg | ORAL_TABLET | Freq: Every day | ORAL | Status: AC
  Administered 2021-03-20 – 2021-03-24 (×5): 40 mg via ORAL
  Filled 2021-03-19 (×5): qty 2

## 2021-03-19 MED ORDER — SODIUM CHLORIDE 0.9 % IV SOLN
INTRAVENOUS | Status: DC | PRN
  Administered 2021-03-19: 500 mL via INTRAVENOUS

## 2021-03-19 NOTE — Progress Notes (Signed)
[facility]  PROGRESS NOTE  Michele Green ZHY:[id number] DOB: Nov 03, 1941 DOA: 03/14/2021 PCP: [name], MD   Brief HPI:   80 year old female with history of ulcerative colitis, chronic loose stools, paroxysmal atrial fibrillation on Xarelto and digoxin s/p maze procedure, CAD s/p CABG, chronic diastolic CHF, Addison's disease, breast cancer, COVID infection in January 2022, hypertension, hyperlipidemia presented to ED with neck pain.  Patient developed right lateral neck and face pain and presented to [facility] [place], found to have SIRS criteria.  Code sepsis was activated.  Patient is on home hospice due to end-stage hypertensive heart disease.  CT soft tissue neck with contrast showed acute parotitis.  No abscess or obstructing process.  Secondary inflammation within the right neck and pharynx.    Subjective   Patient seen and examined, continues to have diarrhea.  She has been tachycardic since this morning.  EKG showed sinus tachycardia.   Assessment/Plan:     1. Sepsis due to acute parotitis/MRSA bacteremia-patient presented with leukocytosis, WBC 30,000, fever tachycardia and tachypnea.  Initially started on Unasyn and was switched to IV daptomycin and p.o. Flagyl on 03/15/2021 due to MRSA bacteremia.  2D echo showed no endocarditis.  Repeat blood cultures as of 4/25 are negative to date.  ID was consulted.  As blood cultures were negative she was given 1 dose of oritavancin yesterday this is to be followed by 4 weeks of linezolid to be started 10 days after dose of oritavancin for empirically treating endocarditis.  Continue metronidazole for total 10 days.  Monitor CBC for at least every 2 weeks while being on the linezolid. 2. MRSA bacteremia-likely from above.  Patient on antibiotics as above.  Antibiotics as above.   Plan for total 6 weeks of antibiotics.  WBC is up to 14,000 this morning. 3. History of relative adrenal insufficiency-patient was started on Solu-Cortef 50  mg IV every 6 hours as stress dose steroids on 03/14/2021.  Dose of Solu-Cortef was changed to 50 mg IV every 8 hours.  Will discontinue Solu-Cortef and start prednisone 40 mg from tomorrow morning which can be tapered off over next 5 days.   Patient is not taking steroids at home. 4. Hypomagnesemia-magnesium is 1.5, will replace magnesium and follow magnesium level in a.m. 5. Hypokalemia-potassium is 3.4.  Will replace potassium and follow BMP in am.   6. Chronic atrial flutter-continue diltiazem, digoxin.  Xarelto for anticoagulation.  Patient has been in sinus tachycardia since this morning.  Likely exacerbated from persistent diarrhea.  We will start IV normal saline. 7. Leg wounds-POA-continue local wound care. 8. Diarrhea-patient has history of ulcerative colitis.  She is followed by [facility].  Called and discussed with Dr. Alessandra Bevels, he recommended to get GI pathogen panel and call them if abnormal.  If GI pathogen panel is negative, she can be given Imodium as needed.  C. difficile PCR was negative.  We will start IV normal saline at 100 mL/h. 9. Hypertension-continue diltiazem to 40 mg daily, metoprolol 12.5 mg p.o. twice daily 10. History of breast cancer-patient no longer on tamoxifen. 11. Goals of care-patient is under hospice care, plan to discharge back to assisted living facility with hospice.   Scheduled medications:   . Chlorhexidine Gluconate Cloth  6 each Topical Q0600  . collagenase   Topical Daily  . digoxin  0.0625 mg Oral Daily  . diltiazem  120 mg Oral Daily  . escitalopram  10 mg Oral Daily  . feeding supplement  237 mL Oral  BID BM  . hydrOXYzine  25 mg Oral BID  . Mesalamine  400 mg Oral Daily  . metoprolol tartrate  12.5 mg Oral BID  . metroNIDAZOLE  500 mg Oral Q8H  . mupirocin ointment  1 application Nasal BID  . [START ON 03/20/2021] predniSONE  40 mg Oral Q breakfast  . Rivaroxaban  15 mg Oral QHS  . sodium chloride flush  3 mL Intravenous Q12H          Data Reviewed:   CBG:  Recent Labs  Lab 03/14/21 1042  GLUCAP 157*    SpO2: 97 % O2 Flow Rate (L/min): 2 L/min    Vitals:   03/19/21 0555 03/19/21 0719 03/19/21 0947 03/19/21 1728  BP: 97/72 106/64  105/70  Pulse:  (!) 129 (!) 129 (!) 129  Resp: 15 16 16 19   Temp: (!) 97.4 F (36.3 C) (!) 97.2 F (36.2 C) 97.9 F (36.6 C) 97.8 F (36.6 C)  TempSrc: Oral Oral Oral Oral  SpO2:  96% 96% 97%  Weight: 53.5 kg     Height:         Intake/Output Summary (Last 24 hours) at 03/19/2021 1818 Last data filed at 03/18/2021 1832 Gross per 24 hour  Intake 223.26 ml  Output --  Net 223.26 ml    04/26 1901 - 04/28 0700 In: 2922.5 [P.O.:1131; I.V.:672.8] Out: 601 [Urine:600]  Filed Weights   03/16/21 0330 03/18/21 0505 03/19/21 0555  Weight: 49.3 kg 51.9 kg 53.5 kg    CBC:  Recent Labs  Lab 03/14/21 0631 03/15/21 0657 03/18/21 0437 03/19/21 0241  WBC 33.4* 24.9* 8.8 14.0*  HGB 12.9 10.1* 9.5* 10.4*  HCT 38.5 30.5* 28.7* 31.3*  PLT 219 153 172 186  MCV 90.4 91.9 90.5 90.5  MCH 30.3 30.4 30.0 30.1  MCHC 33.5 33.1 33.1 33.2  RDW 15.3 15.5 15.3 15.4  LYMPHSABS 0.9  --   --   --   MONOABS 1.4*  --   --   --   EOSABS 0.0  --   --   --   BASOSABS 0.1  --   --   --     Complete metabolic panel:  Recent Labs  Lab 03/14/21 0631 03/14/21 0831 03/15/21 0657 03/18/21 0437 03/19/21 0241  NA 136  --  134* 139 135  K 4.3  --  3.6 2.9* 3.4*  CL 96*  --  103 104 103  CO2 20*  --  23 25 23   GLUCOSE 136*  --  116* 133* 124*  BUN 16  --  16 21 21   CREATININE 1.12*  --  1.01* 0.67 0.72  CALCIUM 8.7*  --  7.3* 8.0* 8.1*  AST 16  --   --   --   --   ALT 11  --   --   --   --   ALKPHOS 58  --   --   --   --   BILITOT 1.1  --   --   --   --   ALBUMIN 3.0*  --   --   --   --   MG  --   --   --   --  1.5*  LATICACIDVEN 3.2* 1.2  --   --   --   INR 1.3*  --   --   --   --     No results for input(s): LIPASE, AMYLASE in the last 168 hours.  Recent Labs  Lab  03/14/21 0631  SARSCOV2NAA NEGATIVE    ------------------------------------------------------------------------------------------------------------------ No results for input(s): CHOL, HDL, LDLCALC, TRIG, CHOLHDL, LDLDIRECT in the last 72 hours.  Lab Results  Component Value Date   HGBA1C 4.6 (L) 08/07/2018   ------------------------------------------------------------------------------------------------------------------ No results for input(s): TSH, T4TOTAL, T3FREE, THYROIDAB in the last 72 hours.  Invalid input(s): FREET3 ------------------------------------------------------------------------------------------------------------------ No results for input(s): VITAMINB12, FOLATE, FERRITIN, TIBC, IRON, RETICCTPCT in the last 72 hours.  Coagulation profile  Recent Labs  Lab 03/14/21 0631  INR 1.3*    No results for input(s): DDIMER in the last 72 hours.  Cardiac Enzymes  No results for input(s): CKMB, TROPONINI, MYOGLOBIN in the last 168 hours.  Invalid input(s): CK ------------------------------------------------------------------------------------------------------------------    Component Value Date/Time   BNP 478.7 (H) 01/24/2021 1610     Antibiotics: Anti-infectives (From admission, onward)   Start     Dose/Rate Route Frequency Ordered Stop   03/18/21 1000  Oritavancin Diphosphate (ORBACTIV) 1,200 mg in dextrose 5 % IVPB        1,200 mg 333.3 mL/hr over 180 Minutes Intravenous Once 03/18/21 0841 03/18/21 1335   03/17/21 2000  DAPTOmycin (CUBICIN) 400 mg in sodium chloride 0.9 % IVPB  Status:  Discontinued        8 mg/kg  49.3 kg 116 mL/hr over 30 Minutes Intravenous Daily 03/16/21 1023 03/18/21 0841   03/16/21 0800  vancomycin (VANCOREADY) IVPB 1000 mg/200 mL  Status:  Discontinued        1,000 mg 200 mL/hr over 60 Minutes Intravenous Every 48 hours 03/15/21 0538 03/16/21 1023   03/16/21 0600  vancomycin (VANCOREADY) IVPB 500 mg/100 mL  Status:  Discontinued         500 mg 100 mL/hr over 60 Minutes Intravenous Every 48 hours 03/14/21 0757 03/14/21 1657   03/15/21 1400  metroNIDAZOLE (FLAGYL) tablet 500 mg        500 mg Oral Every 8 hours 03/15/21 1119     03/15/21 0800  ceFEPIme (MAXIPIME) 2 g in sodium chloride 0.9 % 100 mL IVPB  Status:  Discontinued        2 g 200 mL/hr over 30 Minutes Intravenous Every 24 hours 03/14/21 0757 03/14/21 1657   03/14/21 1800  Ampicillin-Sulbactam (UNASYN) 3 g in sodium chloride 0.9 % 100 mL IVPB  Status:  Discontinued        3 g 200 mL/hr over 30 Minutes Intravenous Every 8 hours 03/14/21 1706 03/15/21 1117   03/14/21 1745  Ampicillin-Sulbactam (UNASYN) 3 g in sodium chloride 0.9 % 100 mL IVPB  Status:  Discontinued        3 g 200 mL/hr over 30 Minutes Intravenous Every 6 hours 03/14/21 1657 03/14/21 1705   03/14/21 0645  ceFEPIme (MAXIPIME) 2 g in sodium chloride 0.9 % 100 mL IVPB        2 g 200 mL/hr over 30 Minutes Intravenous  Once 03/14/21 0632 03/14/21 1512   03/14/21 0645  metroNIDAZOLE (FLAGYL) IVPB 500 mg        500 mg 100 mL/hr over 60 Minutes Intravenous  Once 03/14/21 0632 03/14/21 1512   03/14/21 0645  vancomycin (VANCOCIN) IVPB 1000 mg/200 mL premix        1,000 mg 200 mL/hr over 60 Minutes Intravenous  Once 03/14/21 2202 03/14/21 1512       Radiology Reports  No results found.    DVT prophylaxis: Xarelto  Code Status: DNR  Family Communication: No family at bedside   Consultants:    Procedures:  Objective    Physical Examination:   General-appears in no acute distress  Heart-S1-S2, regular, no murmur auscultated  Lungs-clear to auscultation bilaterally, no wheezing or crackles auscultated  Abdomen-soft, nontender, no organomegaly  Extremities-no edema in the lower extremities  Neuro-alert, oriented x3, no focal deficit noted   Status is: Inpatient  Dispo: The patient is from: Home              Anticipated d/c is to: Skilled nursing facility               Anticipated d/c date is: 03/20/2021              Patient currently not stable for discharge  Barrier to discharge-IV antibiotics for MRSA bacteremia  COVID-19 Labs  No results for input(s): DDIMER, FERRITIN, LDH, CRP in the last 72 hours.  Lab Results  Component Value Date   [place] NEGATIVE 03/14/2021   [place] NEGATIVE 02/02/2021   [place] NEGATIVE 01/24/2021   [place] NEGATIVE 01/20/2021    Microbiology  Recent Results (from the past 240 hour(s))  Resp Panel by RT-PCR (Flu A&B, Covid) Nasopharyngeal Swab     Status: None   Collection Time: 03/14/21  6:31 AM   Specimen: Nasopharyngeal Swab; Nasopharyngeal(NP) swabs in vial transport medium  Result Value Ref Range Status   SARS Coronavirus 2 by RT PCR NEGATIVE NEGATIVE Final    Comment: (NOTE) SARS-CoV-2 target nucleic acids are NOT DETECTED.  The SARS-CoV-2 RNA is generally detectable in upper respiratory specimens during the acute phase of infection. The lowest concentration of SARS-CoV-2 viral copies this assay can detect is 138 copies/mL. A negative result does not preclude SARS-Cov-2 infection and should not be used as the sole basis for treatment or other patient management decisions. A negative result may occur with  improper specimen collection/handling, submission of specimen other than nasopharyngeal swab, presence of viral mutation(s) within the areas targeted by this assay, and inadequate number of viral copies(<138 copies/mL). A negative result must be combined with clinical observations, patient history, and epidemiological information. The expected result is Negative.  Fact Sheet for Patients:  EntrepreneurPulse.com.au  Fact Sheet for Healthcare Providers:  IncredibleEmployment.be  This test is no t yet approved or cleared by the [place] FDA and  has been authorized for detection and/or diagnosis of SARS-CoV-2 by FDA under an Emergency Use  Authorization (EUA). This EUA will remain  in effect (meaning this test can be used) for the duration of the COVID-19 declaration under Section 564(b)(1) of the Act, 21 U.S.C.section 360bbb-3(b)(1), unless the authorization is terminated  or revoked sooner.       Influenza A by PCR NEGATIVE NEGATIVE Final   Influenza B by PCR NEGATIVE NEGATIVE Final    Comment: (NOTE) The Xpert Xpress SARS-CoV-2/FLU/RSV plus assay is intended as an aid in the diagnosis of influenza from Nasopharyngeal swab specimens and should not be used as a sole basis for treatment. Nasal washings and aspirates are unacceptable for Xpert Xpress SARS-CoV-2/FLU/RSV testing.  Fact Sheet for Patients: EntrepreneurPulse.com.au  Fact Sheet for Healthcare Providers: IncredibleEmployment.be  This test is not yet approved or cleared by the [place] FDA and has been authorized for detection and/or diagnosis of SARS-CoV-2 by FDA under an Emergency Use Authorization (EUA). This EUA will remain in effect (meaning this test can be used) for the duration of the COVID-19 declaration under Section 564(b)(1) of the Act, 21 U.S.C. section 360bbb-3(b)(1), unless the authorization is terminated or revoked.  Performed at [facility]   Blood Culture (routine x 2)     Status: Abnormal   Collection Time: 03/14/21  6:31 AM   Specimen: BLOOD  Result Value Ref Range Status   Specimen Description   Final    BLOOD BLOOD LEFT FOREARM Performed at [facility]    Special Requests   Final    BOTTLES DRAWN AEROBIC AND ANAEROBIC Blood Culture adequate volume Performed at [facility]    Culture  Setup Time   Final    GRAM POSITIVE COCCI ANAEROBIC BOTTLE ONLY CRITICAL VALUE NOTED.  VALUE IS CONSISTENT WITH PREVIOUSLY REPORTED AND CALLED VALUE.    Culture (A)  Final    STAPHYLOCOCCUS AUREUS SUSCEPTIBILITIES PERFORMED ON PREVIOUS CULTURE  WITHIN THE LAST 5 DAYS. Performed at [facility], [place] [address] [place], [place] [address]    Report Status 03/17/2021 FINAL  Final  Group A Strep by PCR     Status: None   Collection Time: 03/14/21  6:31 AM   Specimen: Peripheral; Sterile Swab  Result Value Ref Range Status   Group A Strep by PCR NOT DETECTED NOT DETECTED Final    Comment: Performed at [facility]  Blood Culture (routine x 2)     Status: Abnormal   Collection Time: 03/14/21  6:36 AM   Specimen: BLOOD  Result Value Ref Range Status   Specimen Description   Final    BLOOD BLOOD LEFT WRIST Performed at [facility]    Special Requests   Final    BOTTLES DRAWN AEROBIC AND ANAEROBIC Blood Culture adequate volume Performed at [facility]    Culture  Setup Time   Final    GRAM POSITIVE COCCI AEROBIC BOTTLE ONLY CRITICAL RESULT CALLED TO, READ BACK BY AND VERIFIED WITH: Beulah Gandy 03/15/2021 at [place] Performed at [facility], [place] [address] [place], [place] [address]    Culture METHICILLIN RESISTANT STAPHYLOCOCCUS AUREUS (A)  Final   Report Status 03/17/2021 FINAL  Final   Organism ID, Bacteria METHICILLIN RESISTANT STAPHYLOCOCCUS AUREUS  Final      Susceptibility   Methicillin resistant staphylococcus aureus - MIC*    CIPROFLOXACIN >=8 RESISTANT Resistant     ERYTHROMYCIN >=8 RESISTANT Resistant     GENTAMICIN <=0.5 SENSITIVE Sensitive     OXACILLIN >=4 RESISTANT Resistant     TETRACYCLINE <=1 SENSITIVE Sensitive     VANCOMYCIN <=0.5 SENSITIVE Sensitive     TRIMETH/SULFA >=320 RESISTANT Resistant     CLINDAMYCIN <=0.25 SENSITIVE Sensitive     RIFAMPIN <=0.5 SENSITIVE Sensitive     Inducible Clindamycin NEGATIVE Sensitive     * METHICILLIN RESISTANT STAPHYLOCOCCUS AUREUS  Blood Culture ID Panel (Reflexed)     Status: Abnormal   Collection Time: 03/14/21  6:36 AM  Result Value Ref Range Status   Enterococcus faecalis NOT  DETECTED NOT DETECTED Final   Enterococcus Faecium NOT DETECTED NOT DETECTED Final   Listeria monocytogenes NOT DETECTED NOT DETECTED Final   Staphylococcus species DETECTED (A) NOT DETECTED Final    Comment: CRITICAL RESULT CALLED TO, READ BACK BY AND VERIFIED WITH: PHARMD JAMES LEDFORD 03/15/2021 AT 0503 A.HUGHES    Staphylococcus aureus (BCID) DETECTED (A) NOT DETECTED Final    Comment: Methicillin (oxacillin)-resistant Staphylococcus aureus (MRSA). MRSA is predictably resistant to beta-lactam antibiotics (except ceftaroline). Preferred therapy is vancomycin unless clinically contraindicated. Patient requires contact precautions if  hospitalized. CRITICAL RESULT CALLED TO, READ BACK BY  AND VERIFIED WITH: PHARMD JAMES LEDFORD 03/15/2021 AT 0503 A.HUGHES    Staphylococcus epidermidis NOT DETECTED NOT DETECTED Final   Staphylococcus lugdunensis NOT DETECTED NOT DETECTED Final   Streptococcus species NOT DETECTED NOT DETECTED Final   Streptococcus agalactiae NOT DETECTED NOT DETECTED Final   Streptococcus pneumoniae NOT DETECTED NOT DETECTED Final   Streptococcus pyogenes NOT DETECTED NOT DETECTED Final   A.calcoaceticus-baumannii NOT DETECTED NOT DETECTED Final   Bacteroides fragilis NOT DETECTED NOT DETECTED Final   Enterobacterales NOT DETECTED NOT DETECTED Final   Enterobacter cloacae complex NOT DETECTED NOT DETECTED Final   Escherichia coli NOT DETECTED NOT DETECTED Final   Klebsiella aerogenes NOT DETECTED NOT DETECTED Final   Klebsiella oxytoca NOT DETECTED NOT DETECTED Final   Klebsiella pneumoniae NOT DETECTED NOT DETECTED Final   Proteus species NOT DETECTED NOT DETECTED Final   Salmonella species NOT DETECTED NOT DETECTED Final   Serratia marcescens NOT DETECTED NOT DETECTED Final   Haemophilus influenzae NOT DETECTED NOT DETECTED Final   Neisseria meningitidis NOT DETECTED NOT DETECTED Final   Pseudomonas aeruginosa NOT DETECTED NOT DETECTED Final   Stenotrophomonas  maltophilia NOT DETECTED NOT DETECTED Final   Candida albicans NOT DETECTED NOT DETECTED Final   Candida auris NOT DETECTED NOT DETECTED Final   Candida glabrata NOT DETECTED NOT DETECTED Final   Candida krusei NOT DETECTED NOT DETECTED Final   Candida parapsilosis NOT DETECTED NOT DETECTED Final   Candida tropicalis NOT DETECTED NOT DETECTED Final   Cryptococcus neoformans/gattii NOT DETECTED NOT DETECTED Final   Meth resistant mecA/C and MREJ DETECTED (A) NOT DETECTED Final    Comment: CRITICAL RESULT CALLED TO, READ BACK BY AND VERIFIED WITH: PHARMD JAMES LEDFORD 03/15/2021 AT 0503 A.HUGHES Performed at [facility], [place] [address] [place], [place] [address]   Urine culture     Status: None   Collection Time: 03/14/21 10:44 AM   Specimen: In/Out Cath Urine  Result Value Ref Range Status   Specimen Description   Final    IN/OUT CATH URINE Performed at [facility]    Special Requests NONE Performed at [facility]   Final   Culture   Final    NO GROWTH Performed at [facility], [place] [address] [place], [place] [address]    Report Status 03/15/2021 FINAL  Final  MRSA PCR Screening     Status: Abnormal   Collection Time: 03/14/21  6:00 PM   Specimen: Nasopharyngeal  Result Value Ref Range Status   MRSA by PCR POSITIVE (A) NEGATIVE Final    Comment:        The GeneXpert MRSA Assay (FDA approved for NASAL specimens only), is one component of a comprehensive MRSA colonization surveillance program. It is not intended to diagnose MRSA infection nor to guide or monitor treatment for MRSA infections. RESULT CALLED TO, READ BACK BY AND VERIFIED WITH: K,HOLDEN RN @2022  03/14/21 EB Performed at [place] [address], [place], [place] [address]   Respiratory (~20 pathogens) panel by PCR     Status: None   Collection Time: 03/14/21  6:30 PM  Result Value Ref Range Status   Adenovirus NOT DETECTED NOT DETECTED Final    Coronavirus 229E NOT DETECTED NOT DETECTED Final    Comment: (NOTE) The Coronavirus on the Respiratory Panel, DOES NOT test for the novel  Coronavirus (2019 nCoV)    Coronavirus HKU1 NOT DETECTED NOT DETECTED Final   Coronavirus NL63 NOT DETECTED  NOT DETECTED Final   Coronavirus OC43 NOT DETECTED NOT DETECTED Final   Metapneumovirus NOT DETECTED NOT DETECTED Final   Rhinovirus / Enterovirus NOT DETECTED NOT DETECTED Final   Influenza A NOT DETECTED NOT DETECTED Final   Influenza B NOT DETECTED NOT DETECTED Final   Parainfluenza Virus 1 NOT DETECTED NOT DETECTED Final   Parainfluenza Virus 2 NOT DETECTED NOT DETECTED Final   Parainfluenza Virus 3 NOT DETECTED NOT DETECTED Final   Parainfluenza Virus 4 NOT DETECTED NOT DETECTED Final   Respiratory Syncytial Virus NOT DETECTED NOT DETECTED Final   Bordetella pertussis NOT DETECTED NOT DETECTED Final   Bordetella Parapertussis NOT DETECTED NOT DETECTED Final   Chlamydophila pneumoniae NOT DETECTED NOT DETECTED Final   Mycoplasma pneumoniae NOT DETECTED NOT DETECTED Final    Comment: Performed at [facility], [place] [address], [place], [place] [address]  C Difficile Quick Screen w PCR reflex     Status: None   Collection Time: 03/15/21  6:00 PM   Specimen: STOOL  Result Value Ref Range Status   C Diff antigen NEGATIVE NEGATIVE Final   C Diff toxin NEGATIVE NEGATIVE Final   C Diff interpretation No C. difficile detected.  Final    Comment: Performed at [facility], [place] [address], [place], [place] [address]  Culture, blood (routine x 2)     Status: None (Preliminary result)   Collection Time: 03/16/21  9:28 AM   Specimen: BLOOD  Result Value Ref Range Status   Specimen Description BLOOD RIGHT ANTECUBITAL  Final   Special Requests   Final    BOTTLES DRAWN AEROBIC AND ANAEROBIC Blood Culture adequate volume   Culture   Final    NO GROWTH 3 DAYS Performed at [facility], [place] [address], [place], [place] [address]     Report Status PENDING  Incomplete  Culture, blood (routine x 2)     Status: None (Preliminary result)   Collection Time: 03/16/21  9:28 AM   Specimen: BLOOD  Result Value Ref Range Status   Specimen Description BLOOD BLOOD RIGHT HAND  Final   Special Requests   Final    BOTTLES DRAWN AEROBIC AND ANAEROBIC Blood Culture results may not be optimal due to an inadequate volume of blood received in culture bottles   Culture   Final    NO GROWTH 3 DAYS Performed at [facility], [place] [address] [place], [place] [address]    Report Status PENDING  Incomplete  Culture, blood (routine x 2)     Status: None (Preliminary result)   Collection Time: 03/17/21  3:12 AM   Specimen: BLOOD LEFT HAND  Result Value Ref Range Status   Specimen Description BLOOD LEFT HAND  Final   Special Requests   Final    BOTTLES DRAWN AEROBIC ONLY Blood Culture adequate volume   Culture   Final    NO GROWTH 2 DAYS Performed at [facility], [place] [address] [place], [place] [address]    Report Status PENDING  Incomplete  Culture, blood (routine x 2)     Status: None (Preliminary result)   Collection Time: 03/17/21  3:13 AM   Specimen: BLOOD  Result Value Ref Range Status   Specimen Description BLOOD SITE NOT SPECIFIED  Final   Special Requests   Final    BOTTLES DRAWN AEROBIC ONLY Blood Culture adequate volume   Culture   Final    NO GROWTH 2 DAYS Performed  at [facility], [place] [address], [place], [place] [address]    Report Status PENDING  Incomplete    Pressure Injury 03/14/21 Heel Right Unstageable - Full thickness tissue loss in which the base of the injury is covered by slough (yellow, tan, gray, green or brown) and/or eschar (tan, brown or black) in the wound bed. Yellow Slough visible; WOC updated (Active)  03/14/21 1630  Location: Heel  Location Orientation: Right  Staging: Unstageable - Full thickness tissue loss in which the base of the injury is covered by slough (yellow, tan,  gray, green or brown) and/or eschar (tan, brown or black) in the wound bed.  Wound Description (Comments): Yellow Slough visible; WOC updated to Unstagable slough covering wound base.  Present on Admission: Yes          [place]   [facility] If 7PM-7AM, please contact night-coverage at [email or web address], Office  [phone]   03/19/2021, 6:18 PM  LOS: 5 days

## 2021-03-19 NOTE — Progress Notes (Signed)
Pt was anxious today. Attending was contacted and provider advised to give clonazepam for anxiety. Order in [facility]. Pt tolerated well and anxiety improved.

## 2021-03-20 ENCOUNTER — Inpatient Hospital Stay (HOSPITAL_COMMUNITY)

## 2021-03-20 LAB — CBC
HCT: 30.8 % — ABNORMAL LOW (ref 36.0–46.0)
Hemoglobin: 10.2 g/dL — ABNORMAL LOW (ref 12.0–15.0)
MCH: 30.3 pg (ref 26.0–34.0)
MCHC: 33.1 g/dL (ref 30.0–36.0)
MCV: 91.4 fL (ref 80.0–100.0)
Platelets: 161 10*3/uL (ref 150–400)
RBC: 3.37 MIL/uL — ABNORMAL LOW (ref 3.87–5.11)
RDW: 15.6 % — ABNORMAL HIGH (ref 11.5–15.5)
WBC: 13.1 10*3/uL — ABNORMAL HIGH (ref 4.0–10.5)
nRBC: 0 % (ref 0.0–0.2)

## 2021-03-20 LAB — GASTROINTESTINAL PANEL BY PCR, STOOL (REPLACES STOOL CULTURE)

## 2021-03-20 LAB — BASIC METABOLIC PANEL
Anion gap: 5 (ref 5–15)
BUN: 20 mg/dL (ref 8–23)
CO2: 26 mmol/L (ref 22–32)
Calcium: 7.9 mg/dL — ABNORMAL LOW (ref 8.9–10.3)
Chloride: 105 mmol/L (ref 98–111)
Creatinine, Ser: 0.63 mg/dL (ref 0.44–1.00)
GFR, Estimated: >60 mL/min (ref >60)
Glucose, Bld: 95 mg/dL (ref 70–99)
Potassium: 3.6 mmol/L (ref 3.5–5.1)
Sodium: 136 mmol/L (ref 135–145)

## 2021-03-20 LAB — MAGNESIUM: Magnesium: 1.7 mg/dL (ref 1.7–2.4)

## 2021-03-20 LAB — DIGOXIN LEVEL: Digoxin Level: 1.3 ng/mL (ref 0.8–2.0)

## 2021-03-20 MED ORDER — CLONAZEPAM 0.5 MG PO TABS
0.5000 mg | ORAL_TABLET | Freq: Two times a day (BID) | ORAL | Status: DC | PRN
  Administered 2021-03-21: 0.5 mg via ORAL
  Filled 2021-03-20: qty 1

## 2021-03-20 MED ORDER — LOPERAMIDE HCL 2 MG PO CAPS
2.0000 mg | ORAL_CAPSULE | Freq: Two times a day (BID) | ORAL | Status: DC | PRN
  Administered 2021-03-20 – 2021-03-21 (×2): 2 mg via ORAL
  Filled 2021-03-20 (×2): qty 1

## 2021-03-20 MED ORDER — MAGNESIUM SULFATE 2 GM/50ML IV SOLN
2.0000 g | Freq: Once | INTRAVENOUS | Status: AC
  Administered 2021-03-20: 2 g via INTRAVENOUS
  Filled 2021-03-20: qty 50

## 2021-03-20 MED ORDER — CLONAZEPAM 0.5 MG PO TABS: 0.5000 mg | ORAL_TABLET | Freq: Two times a day (BID) | ORAL | Status: DC | PRN

## 2021-03-20 MED ORDER — POTASSIUM CHLORIDE CRYS ER 20 MEQ PO TBCR
40.0000 meq | EXTENDED_RELEASE_TABLET | Freq: Once | ORAL | Status: AC
  Administered 2021-03-20: 40 meq via ORAL
  Filled 2021-03-20: qty 2

## 2021-03-20 MED ORDER — METOPROLOL TARTRATE 25 MG PO TABS
25.0000 mg | ORAL_TABLET | Freq: Two times a day (BID) | ORAL | Status: DC
  Administered 2021-03-20 – 2021-03-24 (×8): 25 mg via ORAL
  Filled 2021-03-20 (×8): qty 1

## 2021-03-20 MED ORDER — BOOST / RESOURCE BREEZE PO LIQD CUSTOM
1.0000 | Freq: Three times a day (TID) | ORAL | Status: DC
  Administered 2021-03-20 – 2021-03-24 (×13): 1 via ORAL

## 2021-03-20 NOTE — Progress Notes (Signed)
Nutrition Follow-up  DOCUMENTATION CODES:   Not applicable  INTERVENTION:  Provide Boost Breeze po TID, each supplement provides 250 kcal and 9 grams of protein.  Encourage adequate PO intake.   NUTRITION DIAGNOSIS:   Increased nutrient needs related to acute illness (MRSA bacteremia) as evidenced by estimated needs; ongoing  GOAL:   Patient will meet greater than or equal to 90% of their needs; progressing  MONITOR:   PO intake,Supplement acceptance,Skin,Weight trends,I & O's,Labs  REASON FOR ASSESSMENT:   Malnutrition Screening Tool    ASSESSMENT:   80 y.o. female with medical history significant of UC; paroxysmal A. fib on Xarelto and digoxin s/p Maze; CAD s/p CABG; chronic diastolic dysfunction; Addison's disease; breast cancer; COVID in 11/2020; HTN; and HLD who presented with neck pain. Pt with Sepsis due to acute parotiditis and MRSA bacteremia  Meal completion has been 25-50%. Pt currently has Ensure ordered and has been consuming them. Pt with ongoing diarrhea. RD consulted for supplement modification. RD to order Boost Breeze a non-milk based supplement which will continue to aid in caloric and protein needs. Pt encouraged to eat her food at meals and to drink her supplements. Plans for continued hospice at discharge and continue to focus on comfort but treat wheat can be treated.   Labs and medications reviewed.   Diet Order:   Diet Order            DIET SOFT Room service appropriate? Yes; Fluid consistency: Thin  Diet effective now                 EDUCATION NEEDS:   Not appropriate for education at this time  Skin:  Skin Assessment: Reviewed RN Assessment Skin Integrity Issues:: Unstageable Unstageable: R heel  Last BM:  4/28  Height:   Ht Readings from Last 1 Encounters:  03/14/21 5' 3"  (1.6 m)    Weight:   Wt Readings from Last 1 Encounters:  03/20/21 58 kg    BMI:  Body mass index is 22.65 kg/m.  Estimated Nutritional Needs:    Kcal:  1450-1650  Protein:  60-75 grams  Fluid:  >/= 1.5 L/day  Corrin Parker, MS, RD, LDN RD pager number/after hours weekend pager number on Am[employer].

## 2021-03-20 NOTE — Progress Notes (Addendum)
PROGRESS NOTE   Michele Green  CNO:[id number]    DOB: May 18, 1941    DOA: 03/14/2021  PCP: Michele Neer, MD   I have briefly reviewed patients previous medical records in [facility].  Chief Complaint  Patient presents with  . Neck Pain    Brief Narrative:  80 year old female, on home hospice due to end-stage hypertensive heart disease, with medical history significant for but not limited to ulcerative colitis, chronic loose stools, paroxysmal atrial fibrillation on Xarelto and digoxin, s/p maze procedure, CAD s/p CABG, chronic diastolic CHF, Addison's disease, breast cancer, COVID 19 infection in January 2022, hypertension, hyperlipidemia, presented to the ED with right lateral neck and face pain.  Admitted for sepsis due to acute parotitis and MRSA bacteremia.  Course complicated by diarrhea and persistent sinus tachycardia.   Assessment & Plan:  Principal Problem:   MRSA bacteremia Active Problems:   Essential hypertension   Atrial flutter with rapid ventricular response (HCC)   Goals of care, counseling/discussion   Adrenal insufficiency (Addison's disease) ([place])   Functional quadriplegia (HCC)   Acute parotitis   Sepsis ([place])   Multiple open wounds of lower extremity   Sepsis, POA due to right acute parotitis and MRSA bacteremia: Infectious disease assisted with management and signed off 4/27.  TTE with no vegetation.  Antibiotics (Unasyn 4/23-4/24, cefepime 4/23, metronidazole 4/24 >, vancomycin 4/23 and 4/25, oritavancin 4/27).  Blood cultures x2 from 4/25: Negative to date.  As per ID sign off note 4/27, 4 weeks of linezolid to be started 10 days after dose of oritavancin, for empirically treating endocarditis if repeat blood cultures are negative in 48 hours and , continue metronidazole x10 days, monitor CBC at least every 2 weeks while on linezolid.  Relative adrenal insufficiency: Patient was on Solu-Medrol 50 mg IV every 6 hours which was tapered and eventually  switched to prednisone 40 mg daily on 4/29 x 5 days.  Not on steroids PTA.  Hypomagnesemia Magnesium 1.5 on 4/28, s/p 2 g IV magnesium sulfate same day.  No Mg on this morning's labs, will addend replace as needed.  Hypokalemia Replaced.  Replacing mag as noted above.  Chronic atrial flutter Patient with persistent sinus tachycardia in the 120s, unclear etiology.  Appears euvolemic.  On lower than home dose of Cardizem CD due to soft blood pressures.  Also on metoprolol 12.5 mg twice daily and digoxin, will check level again.  Xarelto for anticoagulation.  Saline lock IVF due to some dyspnea this morning.  Could consider titrating up beta-blockers.  Asymptomatic.  Etiology of dyspnea unclear-likely multifactorial related to poor inspiratory effort, bilateral pleural effusions and tachycardia.  No clear pulmonary edema.  EKG personally reviewed, tachycardia at 129/min,?  Sinus versus MAT.  Leg wounds, POA Continue local wound care  Diarrhea/history of ulcerative colitis Followed by [facility].  Dr. Darrick Green discussed with [facility] Green who recommended getting Green panel and if abnormal to contact them.  If Green panel negative, she can be given Imodium as needed.  C. difficile PCR negative.  Green panel is negative.  As per RN, patient with poor appetite, only drinking Ensure which also could be the cause of diarrhea.  Hence dietitian consulted.  Imodium as needed.  Essential hypertension Soft blood pressures on Cardizem CD 120 mg daily, metoprolol 12.5 mg twice daily.  Anemia of chronic disease Stable.  Anxiety Continue Lexapro, hydroxyzine and Klonopin.  History of breast cancer No longer on tamoxifen.  Goals of care: As per home  hospice team, focus on comfort but treat what can be treated with plans to return to ALF.   Body mass index is 22.65 kg/m.  Nutritional Status Nutrition Problem: Increased nutrient needs Etiology: acute illness (MRSA bacteremia) Signs/Symptoms: estimated  needs Interventions: Ensure Enlive (each supplement provides 350kcal and 20 grams of protein)  Pressure Ulcer:     DVT prophylaxis:      Code Status: DNR Family Communication: None at bedside. Disposition:  Status is: Inpatient  Remains inpatient appropriate because:Inpatient level of care appropriate due to severity of illness   Dispo:  Patient From: Home  Planned Disposition: [facility]  Medically stable for discharge: No          Consultants:   Infectious disease  Procedures:   None  Antimicrobials:    Anti-infectives (From admission, onward)   Start     Dose/Rate Route Frequency Ordered Stop   03/18/21 1000  Oritavancin Diphosphate (ORBACTIV) 1,200 mg in dextrose 5 % IVPB        1,200 mg 333.3 mL/hr over 180 Minutes Intravenous Once 03/18/21 0841 03/18/21 1335   03/17/21 2000  DAPTOmycin (CUBICIN) 400 mg in sodium chloride 0.9 % IVPB  Status:  Discontinued        8 mg/kg  49.3 kg 116 mL/hr over 30 Minutes Intravenous Daily 03/16/21 1023 03/18/21 0841   03/16/21 0800  vancomycin (VANCOREADY) IVPB 1000 mg/200 mL  Status:  Discontinued        1,000 mg 200 mL/hr over 60 Minutes Intravenous Every 48 hours 03/15/21 0538 03/16/21 1023   03/16/21 0600  vancomycin (VANCOREADY) IVPB 500 mg/100 mL  Status:  Discontinued        500 mg 100 mL/hr over 60 Minutes Intravenous Every 48 hours 03/14/21 0757 03/14/21 1657   03/15/21 1400  metroNIDAZOLE (FLAGYL) tablet 500 mg        500 mg Oral Every 8 hours 03/15/21 1119     03/15/21 0800  ceFEPIme (MAXIPIME) 2 g in sodium chloride 0.9 % 100 mL IVPB  Status:  Discontinued        2 g 200 mL/hr over 30 Minutes Intravenous Every 24 hours 03/14/21 0757 03/14/21 1657   03/14/21 1800  Ampicillin-Sulbactam (UNASYN) 3 g in sodium chloride 0.9 % 100 mL IVPB  Status:  Discontinued        3 g 200 mL/hr over 30 Minutes Intravenous Every 8 hours 03/14/21 1706 03/15/21 1117   03/14/21 1745  Ampicillin-Sulbactam (UNASYN) 3 g  in sodium chloride 0.9 % 100 mL IVPB  Status:  Discontinued        3 g 200 mL/hr over 30 Minutes Intravenous Every 6 hours 03/14/21 1657 03/14/21 1705   03/14/21 0645  ceFEPIme (MAXIPIME) 2 g in sodium chloride 0.9 % 100 mL IVPB        2 g 200 mL/hr over 30 Minutes Intravenous  Once 03/14/21 0632 03/14/21 1512   03/14/21 0645  metroNIDAZOLE (FLAGYL) IVPB 500 mg        500 mg 100 mL/hr over 60 Minutes Intravenous  Once 03/14/21 5885 03/14/21 1512   03/14/21 0645  vancomycin (VANCOCIN) IVPB 1000 mg/200 mL premix        1,000 mg 200 mL/hr over 60 Minutes Intravenous  Once 03/14/21 0277 03/14/21 1512        Subjective:  States that she had multiple diarrhea yesterday, none last night and had a BM this morning.  Reports fatigue and some dyspnea with activity.  As per  RN, poor oral intake, only drinks Ensure or boost.  No chest pain or palpitations  Objective:   Vitals:   03/20/21 0010 03/20/21 0438 03/20/21 0825 03/20/21 1154  BP: 115/80 103/67 97/66 (!) 104/57  Pulse: (!) 129 (!) 127 (!) 129 78  Resp: 20 20 20 18   Temp: (!) 97.2 F (36.2 C) 98.1 F (36.7 C) 98.8 F (37.1 C) 98.6 F (37 C)  TempSrc: Oral Oral Axillary Axillary  SpO2: 97% 97% 97% 99%  Weight:  58 kg    Height:        General exam: Elderly female, small built, chronically ill looking and frail, sitting up in bed without obvious distress. Respiratory system: Slightly diminished breath sounds in the bases but otherwise clear to auscultation without wheezing, rhonchi or crackles.  No increased work of breathing and able to speak in full sentences. Cardiovascular system: S1 & S2 heard, RRR. No JVD, murmurs, rubs, gallops or clicks.  Trace bilateral ankle edema.  Telemetry personally reviewed: Sinus tachycardia in the 120s. Gastrointestinal system: Abdomen is nondistended, soft and nontender. No organomegaly or masses felt. Normal bowel sounds heard. Central nervous system: Alert and oriented. No focal neurological  deficits. Extremities: Symmetric 5 x 5 power. Skin: No rashes, lesions or ulcers Psychiatry: Judgement and insight appear normal. Mood & affect appropriate.     Data Reviewed:   I have personally reviewed following labs and imaging studies   CBC: Recent Labs  Lab 03/14/21 0631 03/15/21 0657 03/18/21 0437 03/19/21 0241 03/20/21 0342  WBC 33.4*   < > 8.8 14.0* 13.1*  NEUTROABS 30.7*  --   --   --   --   HGB 12.9   < > 9.5* 10.4* 10.2*  HCT 38.5   < > 28.7* 31.3* 30.8*  MCV 90.4   < > 90.5 90.5 91.4  PLT 219   < > 172 186 161   < > = values in this interval not displayed.    Basic Metabolic Panel: Recent Labs  Lab 03/18/21 0437 03/19/21 0241 03/20/21 0342  NA 139 135 136  K 2.9* 3.4* 3.6  CL 104 103 105  CO2 25 23 26   GLUCOSE 133* 124* 95  BUN 21 21 20   CREATININE 0.67 0.72 0.63  CALCIUM 8.0* 8.1* 7.9*  MG  --  1.5*  --     Liver Function Tests: Recent Labs  Lab 03/14/21 0631  AST 16  ALT 11  ALKPHOS 58  BILITOT 1.1  PROT 6.3*  ALBUMIN 3.0*    CBG: Recent Labs  Lab 03/14/21 1042  GLUCAP 157*    Microbiology Studies:   Recent Results (from the past 240 hour(s))  Resp Panel by RT-PCR (Flu A&B, Covid) Nasopharyngeal Swab     Status: None   Collection Time: 03/14/21  6:31 AM   Specimen: Nasopharyngeal Swab; Nasopharyngeal(NP) swabs in vial transport medium  Result Value Ref Range Status   SARS Coronavirus 2 by RT PCR NEGATIVE NEGATIVE Final    Comment: (NOTE) SARS-CoV-2 target nucleic acids are NOT DETECTED.  The SARS-CoV-2 RNA is generally detectable in upper respiratory specimens during the acute phase of infection. The lowest concentration of SARS-CoV-2 viral copies this assay can detect is 138 copies/mL. A negative result does not preclude SARS-Cov-2 infection and should not be used as the sole basis for treatment or other patient management decisions. A negative result may occur with  improper specimen collection/handling, submission of  specimen other than nasopharyngeal swab, presence of viral mutation(s)  within the areas targeted by this assay, and inadequate number of viral copies(<138 copies/mL). A negative result must be combined with clinical observations, patient history, and epidemiological information. The expected result is Negative.  Fact Sheet for Patients:  EntrepreneurPulse.com.au  Fact Sheet for Healthcare Providers:  IncredibleEmployment.be  This test is no t yet approved or cleared by the [place] FDA and  has been authorized for detection and/or diagnosis of SARS-CoV-2 by FDA under an Emergency Use Authorization (EUA). This EUA will remain  in effect (meaning this test can be used) for the duration of the COVID-19 declaration under Section 564(b)(1) of the Act, 21 U.S.C.section 360bbb-3(b)(1), unless the authorization is terminated  or revoked sooner.       Influenza A by PCR NEGATIVE NEGATIVE Final   Influenza B by PCR NEGATIVE NEGATIVE Final    Comment: (NOTE) The Xpert Xpress SARS-CoV-2/FLU/RSV plus assay is intended as an aid in the diagnosis of influenza from Nasopharyngeal swab specimens and should not be used as a sole basis for treatment. Nasal washings and aspirates are unacceptable for Xpert Xpress SARS-CoV-2/FLU/RSV testing.  Fact Sheet for Patients: EntrepreneurPulse.com.au  Fact Sheet for Healthcare Providers: IncredibleEmployment.be  This test is not yet approved or cleared by the [place] FDA and has been authorized for detection and/or diagnosis of SARS-CoV-2 by FDA under an Emergency Use Authorization (EUA). This EUA will remain in effect (meaning this test can be used) for the duration of the COVID-19 declaration under Section 564(b)(1) of the Act, 21 U.S.C. section 360bbb-3(b)(1), unless the authorization is terminated or revoked.  Performed at [facility]   Blood  Culture (routine x 2)     Status: Abnormal   Collection Time: 03/14/21  6:31 AM   Specimen: BLOOD  Result Value Ref Range Status   Specimen Description   Final    BLOOD BLOOD LEFT FOREARM Performed at [facility]    Special Requests   Final    BOTTLES DRAWN AEROBIC AND ANAEROBIC Blood Culture adequate volume Performed at [facility]    Culture  Setup Time   Final    GRAM POSITIVE COCCI ANAEROBIC BOTTLE ONLY CRITICAL VALUE NOTED.  VALUE IS CONSISTENT WITH PREVIOUSLY REPORTED AND CALLED VALUE.    Culture (A)  Final    STAPHYLOCOCCUS AUREUS SUSCEPTIBILITIES PERFORMED ON PREVIOUS CULTURE WITHIN THE LAST 5 DAYS. Performed at [facility], [place] [address], [place], [place] [address]    Report Status 03/17/2021 FINAL  Final  Group A Strep by PCR     Status: None   Collection Time: 03/14/21  6:31 AM   Specimen: Peripheral; Sterile Swab  Result Value Ref Range Status   Group A Strep by PCR NOT DETECTED NOT DETECTED Final    Comment: Performed at [facility]  Blood Culture (routine x 2)     Status: Abnormal   Collection Time: 03/14/21  6:36 AM   Specimen: BLOOD  Result Value Ref Range Status   Specimen Description   Final    BLOOD BLOOD LEFT WRIST Performed at [facility]    Special Requests   Final    BOTTLES DRAWN AEROBIC AND ANAEROBIC Blood Culture adequate volume Performed at [facility]    Culture  Setup Time   Final    GRAM POSITIVE COCCI AEROBIC BOTTLE ONLY CRITICAL RESULT CALLED TO, READ BACK BY AND VERIFIED WITH: Beulah Gandy 03/15/2021 at [place] Performed at [facility], [place] [address] [place], [place] [address]    Culture METHICILLIN RESISTANT STAPHYLOCOCCUS AUREUS (A)  Final   Report Status 03/17/2021 FINAL  Final   Organism ID, Bacteria METHICILLIN RESISTANT STAPHYLOCOCCUS AUREUS  Final      Susceptibility   Methicillin resistant staphylococcus  aureus - MIC*    CIPROFLOXACIN >=8 RESISTANT Resistant     ERYTHROMYCIN >=8 RESISTANT Resistant     GENTAMICIN <=0.5 SENSITIVE Sensitive     OXACILLIN >=4 RESISTANT Resistant     TETRACYCLINE <=1 SENSITIVE Sensitive     VANCOMYCIN <=0.5 SENSITIVE Sensitive     TRIMETH/SULFA >=320 RESISTANT Resistant     CLINDAMYCIN <=0.25 SENSITIVE Sensitive     RIFAMPIN <=0.5 SENSITIVE Sensitive     Inducible Clindamycin NEGATIVE Sensitive     * METHICILLIN RESISTANT STAPHYLOCOCCUS AUREUS  Blood Culture ID Panel (Reflexed)     Status: Abnormal   Collection Time: 03/14/21  6:36 AM  Result Value Ref Range Status   Enterococcus faecalis NOT DETECTED NOT DETECTED Final   Enterococcus Faecium NOT DETECTED NOT DETECTED Final   Listeria monocytogenes NOT DETECTED NOT DETECTED Final   Staphylococcus species DETECTED (A) NOT DETECTED Final    Comment: CRITICAL RESULT CALLED TO, READ BACK BY AND VERIFIED WITH: PHARMD JAMES LEDFORD 03/15/2021 AT 0503 A.HUGHES    Staphylococcus aureus (BCID) DETECTED (A) NOT DETECTED Final    Comment: Methicillin (oxacillin)-resistant Staphylococcus aureus (MRSA). MRSA is predictably resistant to beta-lactam antibiotics (except ceftaroline). Preferred therapy is vancomycin unless clinically contraindicated. Patient requires contact precautions if  hospitalized. CRITICAL RESULT CALLED TO, READ BACK BY AND VERIFIED WITH: PHARMD JAMES LEDFORD 03/15/2021 AT 0503 A.HUGHES    Staphylococcus epidermidis NOT DETECTED NOT DETECTED Final   Staphylococcus lugdunensis NOT DETECTED NOT DETECTED Final   Streptococcus species NOT DETECTED NOT DETECTED Final   Streptococcus agalactiae NOT DETECTED NOT DETECTED Final   Streptococcus pneumoniae NOT DETECTED NOT DETECTED Final   Streptococcus pyogenes NOT DETECTED NOT DETECTED Final   A.calcoaceticus-baumannii NOT DETECTED NOT DETECTED Final   Bacteroides fragilis NOT DETECTED NOT DETECTED Final   Enterobacterales NOT DETECTED NOT DETECTED  Final   Enterobacter cloacae complex NOT DETECTED NOT DETECTED Final   Escherichia coli NOT DETECTED NOT DETECTED Final   Klebsiella aerogenes NOT DETECTED NOT DETECTED Final   Klebsiella oxytoca NOT DETECTED NOT DETECTED Final   Klebsiella pneumoniae NOT DETECTED NOT DETECTED Final   Proteus species NOT DETECTED NOT DETECTED Final   Salmonella species NOT DETECTED NOT DETECTED Final   Serratia marcescens NOT DETECTED NOT DETECTED Final   Haemophilus influenzae NOT DETECTED NOT DETECTED Final   Neisseria meningitidis NOT DETECTED NOT DETECTED Final   Pseudomonas aeruginosa NOT DETECTED NOT DETECTED Final   Stenotrophomonas maltophilia NOT DETECTED NOT DETECTED Final   Candida albicans NOT DETECTED NOT DETECTED Final   Candida auris NOT DETECTED NOT DETECTED Final   Candida glabrata NOT DETECTED NOT DETECTED Final   Candida krusei NOT DETECTED NOT DETECTED Final   Candida parapsilosis NOT DETECTED NOT DETECTED Final   Candida tropicalis NOT DETECTED NOT DETECTED Final   Cryptococcus neoformans/gattii NOT DETECTED NOT DETECTED Final   Meth resistant mecA/C and MREJ DETECTED (A) NOT DETECTED Final    Comment: CRITICAL RESULT CALLED TO, READ BACK BY AND VERIFIED WITH: PHARMD JAMES LEDFORD 03/15/2021 AT 0503 A.HUGHES Performed at [facility], [place] [address], [place],  [address]   Urine culture     Status: None   Collection Time: 03/14/21  10:44 AM   Specimen: In/Out Cath Urine  Result Value Ref Range Status   Specimen Description   Final    IN/OUT CATH URINE Performed at [facility]    Special Requests NONE Performed at [facility]   Final   Culture   Final    NO GROWTH Performed at [facility], [address] [place], [place] [address]    Report Status 03/15/2021 FINAL  Final  MRSA PCR Screening     Status: Abnormal   Collection Time: 03/14/21  6:00 PM   Specimen: Nasopharyngeal  Result Value Ref Range Status   MRSA by  PCR POSITIVE (A) NEGATIVE Final    Comment:        The GeneXpert MRSA Assay (FDA approved for NASAL specimens only), is one component of a comprehensive MRSA colonization surveillance program. It is not intended to diagnose MRSA infection nor to guide or monitor treatment for MRSA infections. RESULT CALLED TO, READ BACK BY AND VERIFIED WITH: K,HOLDEN RN @2022  03/14/21 EB Performed at [facility] [address], [place], [place] [address]   Respiratory (~20 pathogens) panel by PCR     Status: None   Collection Time: 03/14/21  6:30 PM  Result Value Ref Range Status   Adenovirus NOT DETECTED NOT DETECTED Final   Coronavirus 229E NOT DETECTED NOT DETECTED Final    Comment: (NOTE) The Coronavirus on the Respiratory Panel, DOES NOT test for the novel  Coronavirus (2019 nCoV)    Coronavirus HKU1 NOT DETECTED NOT DETECTED Final   Coronavirus NL63 NOT DETECTED NOT DETECTED Final   Coronavirus OC43 NOT DETECTED NOT DETECTED Final   Metapneumovirus NOT DETECTED NOT DETECTED Final   Rhinovirus / Enterovirus NOT DETECTED NOT DETECTED Final   Influenza A NOT DETECTED NOT DETECTED Final   Influenza B NOT DETECTED NOT DETECTED Final   Parainfluenza Virus 1 NOT DETECTED NOT DETECTED Final   Parainfluenza Virus 2 NOT DETECTED NOT DETECTED Final   Parainfluenza Virus 3 NOT DETECTED NOT DETECTED Final   Parainfluenza Virus 4 NOT DETECTED NOT DETECTED Final   Respiratory Syncytial Virus NOT DETECTED NOT DETECTED Final   Bordetella pertussis NOT DETECTED NOT DETECTED Final   Bordetella Parapertussis NOT DETECTED NOT DETECTED Final   Chlamydophila pneumoniae NOT DETECTED NOT DETECTED Final   Mycoplasma pneumoniae NOT DETECTED NOT DETECTED Final    Comment: Performed at [facility], [place]. [address], [place], [place] [address]  C Difficile Quick Screen w PCR reflex     Status: None   Collection Time: 03/15/21  6:00 PM   Specimen: STOOL  Result Value Ref Range Status   C Diff antigen  NEGATIVE NEGATIVE Final   C Diff toxin NEGATIVE NEGATIVE Final   C Diff interpretation No C. difficile detected.  Final    Comment: Performed at [facility], [place] [address], [place], [place] [address]  Culture, blood (routine x 2)     Status: None (Preliminary result)   Collection Time: 03/16/21  9:28 AM   Specimen: BLOOD  Result Value Ref Range Status   Specimen Description BLOOD RIGHT ANTECUBITAL  Final   Special Requests   Final    BOTTLES DRAWN AEROBIC AND ANAEROBIC Blood Culture adequate volume   Culture   Final    NO GROWTH 4 DAYS Performed at [facility], [place] [address] [place], [place] [address]    Report Status PENDING  Incomplete  Culture,  blood (routine x 2)     Status: None (Preliminary result)   Collection Time: 03/16/21  9:28 AM   Specimen: BLOOD  Result Value Ref Range Status   Specimen Description BLOOD BLOOD RIGHT HAND  Final   Special Requests   Final    BOTTLES DRAWN AEROBIC AND ANAEROBIC Blood Culture results may not be optimal due to an inadequate volume of blood received in culture bottles   Culture   Final    NO GROWTH 4 DAYS Performed at [facility], [place] [address] [place], [place] [address]    Report Status PENDING  Incomplete  Culture, blood (routine x 2)     Status: None (Preliminary result)   Collection Time: 03/17/21  3:12 AM   Specimen: BLOOD LEFT HAND  Result Value Ref Range Status   Specimen Description BLOOD LEFT HAND  Final   Special Requests   Final    BOTTLES DRAWN AEROBIC ONLY Blood Culture adequate volume   Culture   Final    NO GROWTH 3 DAYS Performed at [facility], [place] [address], [place], [place] [address]    Report Status PENDING  Incomplete  Culture, blood (routine x 2)     Status: None (Preliminary result)   Collection Time: 03/17/21  3:13 AM   Specimen: BLOOD  Result Value Ref Range Status   Specimen Description BLOOD SITE NOT SPECIFIED  Final   Special Requests   Final    BOTTLES DRAWN AEROBIC  ONLY Blood Culture adequate volume   Culture   Final    NO GROWTH 3 DAYS Performed at [facility], [address] [place], [place] [address]    Report Status PENDING  Incomplete  Gastrointestinal Panel by PCR , Stool     Status: None   Collection Time: 03/19/21 10:03 AM   Specimen: Stool  Result Value Ref Range Status   Campylobacter species NOT DETECTED NOT DETECTED Final   Plesimonas shigelloides NOT DETECTED NOT DETECTED Final   Salmonella species NOT DETECTED NOT DETECTED Final   Yersinia enterocolitica NOT DETECTED NOT DETECTED Final   Vibrio species NOT DETECTED NOT DETECTED Final   Vibrio cholerae NOT DETECTED NOT DETECTED Final   Enteroaggregative E coli (EAEC) NOT DETECTED NOT DETECTED Final   Enteropathogenic E coli (EPEC) NOT DETECTED NOT DETECTED Final   Enterotoxigenic E coli (ETEC) NOT DETECTED NOT DETECTED Final   Shiga like toxin producing E coli (STEC) NOT DETECTED NOT DETECTED Final   Shigella/Enteroinvasive E coli (EIEC) NOT DETECTED NOT DETECTED Final   Cryptosporidium NOT DETECTED NOT DETECTED Final   Cyclospora cayetanensis NOT DETECTED NOT DETECTED Final   Entamoeba histolytica NOT DETECTED NOT DETECTED Final   Giardia lamblia NOT DETECTED NOT DETECTED Final   Adenovirus F40/41 NOT DETECTED NOT DETECTED Final   Astrovirus NOT DETECTED NOT DETECTED Final   Norovirus Green/GII NOT DETECTED NOT DETECTED Final   Rotavirus A NOT DETECTED NOT DETECTED Final   Sapovirus (I, II, IV, and V) NOT DETECTED NOT DETECTED Final    Comment: Performed at [facility], [address], [place], [place] [address]     Radiology Studies:  DG CHEST PORT 1 VIEW  Result Date: 03/20/2021 CLINICAL DATA:  80 year old female with shortness of breath.  MRSA. EXAM: PORTABLE CHEST 1 VIEW COMPARISON:  Portable chest 03/14/2021 and earlier. FINDINGS: Portable AP semi upright view at 0958 hours. Lower lung volumes with new bibasilar veiling opacity. No pneumothorax. Pulmonary  vascularity within  normal limits. Stable cardiac size and mediastinal contours. Postoperative changes to the heart and mediastinum. Chronic thoracic vertebral augmentation. Visualized tracheal air column is within normal limits. Stable visible bowel gas pattern. IMPRESSION: Lower lung volumes with evidence of bilateral pleural effusions, new or increased since 03/14/2021. No pulmonary edema. Electronically Signed   By: Genevie Ann M.D.   On: 03/20/2021 10:12     Scheduled Meds:   . collagenase   Topical Daily  . digoxin  0.0625 mg Oral Daily  . diltiazem  120 mg Oral Daily  . escitalopram  10 mg Oral Daily  . feeding supplement  237 mL Oral BID BM  . hydrOXYzine  25 mg Oral BID  . Mesalamine  400 mg Oral Daily  . metoprolol tartrate  12.5 mg Oral BID  . metroNIDAZOLE  500 mg Oral Q8H  . predniSONE  40 mg Oral Q breakfast  . Rivaroxaban  15 mg Oral QHS  . sodium chloride flush  3 mL Intravenous Q12H    Continuous Infusions:   . sodium chloride 500 mL (03/19/21 1502)  . sodium chloride 100 mL/hr at 03/19/21 2355  . diltiazem (CARDIZEM) infusion Stopped (03/15/21 1818)     LOS: 6 days     Vernell Leep, MD, [place], [facility]. [facility]    To contact the attending provider between 7A-7P or the covering provider during after hours 7P-7A, please log into the web site [email or web address] and access using universal  password for that web site. If you do not have the password, please call the hospital operator.  03/20/2021, 3:10 PM

## 2021-03-20 NOTE — Progress Notes (Signed)
Michele Green [id number] Hospice Patient [facility])  Michele Green [facility]. Facility notified [facility] on callstaffthat patient was experiencing swelling on therightside of herneck withassociatedpain. After assessment and discussing with patient's son, the decision was made to have EMS transport her to ED for evaluation. Patient was admitted to [name] on 4/23 with a diagnosis ofsepsis due to acute parotiditis and MRSA bacteremia.Per [facility] physician Dr. Gilford Rile, this is a related admission.  Report exchanged. Visited patient at the bedside. She is alert to person, place and event. She appears anxious and irritated that she is still in the hospital. Provided space, she confirms that she is fearful that she will not be able to leave the hospital and will succumb to her infection. She is fearful that her AL will not be able to provide the level of care she needs and is not sure who will be able to help her. Her son Michele Green has discussed with supervisor at [employer] and they are willing to provide the necessary care for her to return. Michele Green would like her to be medically optimized prior to d/c back to her AL.   V/S: 98.6 oral, 104/57, HR 78 NSR, RR 18, SPO2 99% on RA I&O: 1297/ no recorded output for last 24 hours however clear amber urine noted in purewick container Labs: WBC 13.7, RBC 3.37, hgb 10.2 Diagnostics: portable CXR for SOB - Lower lung volumes with evidence of bilateral pleural effusions, new or increased since 03/14/2021. IVs/PRNs: NS @ KVO  Problem List: - sepsis due to acute parodititis and MRSA bacteremia - As per ID sign off note 4/27, 4 weeks of linezolid to be started 10 days after dose of oritavancin, for empirically treating endocarditis if repeat blood cultures are negative in 48 hours and , continue metronidazole x10 days, monitor CBC at least every 2  weeks while on linezolid. - a. Fib/a. Flutter - was on diltiazem drip > today 4/29 she is in NSR - pleural effusion noted on CXR - patient reported SOB this am, states she feels better now and is on room air, IV fluids have been changed to [facility] - diarrhea - patient reports no episodes today, c-diff PCR was negative, registered dietician consult placed by attending  GOC: clear, focus on comfort but treat what can be treated D/C planning: return to ALF. Son contacted ALF to discuss increasing her level of assistance to a 6/6, he advised that [facility] can take her back once she is medically optimized and for the hospital TOC to assist once ready. Michele Green states that facility on call supervisor will be available over the weekend to help with re-admission, should she be ready to d/c. She will be prescribed linezolid for 4 weeks and need a CBC every two weeks.  IDT: hospice team updated Family: updated [place] via phone  Michele Carbon RN, BSN, [facility] Liaison

## 2021-03-20 NOTE — Progress Notes (Signed)
Michele Green [id number] Hospice Patient [facility])  Michele Green is a [facility]. Facility notified [facility] on callstaffthat patient was experiencing swelling on therightside of herneck withassociatedpain. After assessment and discussing with patient's son, the decision was made to have EMS transport her to ED for evaluation. Patient was admitted to [facility] on 4/23 with a diagnosis ofSepsis due to acute parotiditis and MRSA bacteremia.Per [facility] physician Dr. Gilford Green, this is a related admission.  Visited patient at the bedside. Pt AOx3, increased anxiety noted. Pt shares concerns over on going diarrhea and care after discharge.Exchanged report with bedside RN confirms continued diarrhea.  Enteric precautions in place and stool specimen ordered. Pt noted to be tachycardic. Pt denied appetite, drinking ensure during visit.  Spoke with son Michele Green by phone who has not heard from Michele Green; encouraged him to reach out to Michele Green today ahead of the weekend.    V/S: 97.9 oral, 105/70, 129 HR, 19 RR, 96% RA I&O: 2491.7/- Labs: K 3.4, Ca 8.1, Mg 1.5, WBC 14.0, Hgb 10.4 Diagnostics: no new diagnostics IVs/PRNs: solu-cortef 50 mg IV q6h (now dc'ed), NS @ 100 ml/hr, oxycodone IR 56m PRN pain x 1 dose, clonazepam 0.5 mg x 1 dose   Problem List: - sepsis due to acute parodititis and MRSA bacteremia - currently on flagyl 500 mg PO TID, getting one time dose of oritavancin PIV today which lasts for two weeks, then transition to linezolid PO - a. Fib/a. Flutter - was on diltiazem drip > now discontinued, on xarelto and digoxin -tachycardia- IV fluids restarted -diarrhea- stool specimen ordered, fluids restarted  GOC: clear, focus on comfort but treat what can be treated D/C planning: return to ALF. Son contacted ALF to discuss increasing her level of assistance to a 6/6, he  advised they need to come to re-evaluate her to make sure they can provide the level of care she is needing. She will be prescribed linezolid for 4 weeks and need a CBC every two weeks.  IDT: hospice team updated Family: updated Michele Green via phone   Thank you for the opportunity to participate in this patient's care.     CDomenic Moras BSN, RN [facility] HospitalLiaison (listed on [facility])    [phone](24h on call)

## 2021-03-21 DIAGNOSIS — I4892 Unspecified atrial flutter: Secondary | ICD-10-CM

## 2021-03-21 LAB — BASIC METABOLIC PANEL
Anion gap: 5 (ref 5–15)
BUN: 14 mg/dL (ref 8–23)
CO2: 26 mmol/L (ref 22–32)
Calcium: 7.8 mg/dL — ABNORMAL LOW (ref 8.9–10.3)
Chloride: 104 mmol/L (ref 98–111)
Creatinine, Ser: 0.61 mg/dL (ref 0.44–1.00)
GFR, Estimated: >60 mL/min (ref >60)
Glucose, Bld: 118 mg/dL — ABNORMAL HIGH (ref 70–99)
Potassium: 4.8 mmol/L (ref 3.5–5.1)
Sodium: 135 mmol/L (ref 135–145)

## 2021-03-21 LAB — CULTURE, BLOOD (ROUTINE X 2)
Culture: NO GROWTH
Culture: NO GROWTH
Special Requests: ADEQUATE

## 2021-03-21 LAB — CBC
HCT: 29 % — ABNORMAL LOW (ref 36.0–46.0)
Hemoglobin: 9.6 g/dL — ABNORMAL LOW (ref 12.0–15.0)
MCH: 30.6 pg (ref 26.0–34.0)
MCHC: 33.1 g/dL (ref 30.0–36.0)
MCV: 92.4 fL (ref 80.0–100.0)
Platelets: 153 10*3/uL (ref 150–400)
RBC: 3.14 MIL/uL — ABNORMAL LOW (ref 3.87–5.11)
RDW: 15.9 % — ABNORMAL HIGH (ref 11.5–15.5)
WBC: 10.8 10*3/uL — ABNORMAL HIGH (ref 4.0–10.5)
nRBC: 0.2 % (ref 0.0–0.2)

## 2021-03-21 LAB — MAGNESIUM: Magnesium: 2.3 mg/dL (ref 1.7–2.4)

## 2021-03-21 MED ORDER — DIGOXIN 125 MCG PO TABS
0.0625 mg | ORAL_TABLET | ORAL | Status: AC
  Administered 2021-03-21: 0.0625 mg via ORAL
  Filled 2021-03-21: qty 1

## 2021-03-21 MED ORDER — CLONAZEPAM 0.5 MG PO TABS
0.5000 mg | ORAL_TABLET | Freq: Two times a day (BID) | ORAL | Status: DC
  Administered 2021-03-21 – 2021-03-24 (×6): 0.5 mg via ORAL
  Filled 2021-03-21 (×6): qty 1

## 2021-03-21 MED ORDER — DIGOXIN 125 MCG PO TABS
0.1250 mg | ORAL_TABLET | Freq: Every day | ORAL | Status: DC
  Administered 2021-03-22 – 2021-03-24 (×3): 0.125 mg via ORAL
  Filled 2021-03-21 (×3): qty 1

## 2021-03-21 NOTE — Progress Notes (Signed)
PROGRESS NOTE   Michele Green  LEX:[id number]    DOB: 06-28-1941    DOA: 03/14/2021  PCP: [name], MD   I have briefly reviewed patients previous medical records in [facility]  Chief Complaint  Patient presents with  . Neck Pain    Brief Narrative:  80 year old female, on home hospice due to end-stage hypertensive heart disease, with medical history significant for but not limited to ulcerative colitis, chronic loose stools, paroxysmal atrial fibrillation on Xarelto and digoxin, s/p maze procedure, CAD s/p CABG, chronic diastolic CHF, Addison's disease, breast cancer, COVID 19 infection in January 2022, hypertension, hyperlipidemia, presented to the ED with right lateral neck and face pain.  Admitted for sepsis due to acute parotitis and MRSA bacteremia.  Course complicated by diarrhea and persistent sinus tachycardia.  Diarrhea improved.  Tachycardia intermittent.   Assessment & Plan:  Principal Problem:   MRSA bacteremia Active Problems:   Essential hypertension   Atrial flutter with rapid ventricular response (HCC)   Goals of care, counseling/discussion   Adrenal insufficiency (Addison's disease) ([place])   Functional quadriplegia (HCC)   Acute parotitis   Sepsis ([place])   Multiple open wounds of lower extremity   Sepsis, POA due to right acute parotitis and MRSA bacteremia: Infectious disease assisted with management and signed off 4/27.  TTE with no vegetation.  Antibiotics (Unasyn 4/23-4/24, cefepime 4/23, metronidazole 4/24 >, vancomycin 4/23 and 4/25, oritavancin 4/27).  Blood cultures x2 from 4/25: Negative to date.  As per ID sign off note 4/27, 4 weeks of linezolid to be started 10 days after dose of oritavancin, for empirically treating endocarditis if repeat blood cultures are negative in 48 hours and , continue metronidazole x10 days, monitor CBC at least every 2 weeks while on linezolid.  No changes/stable.  Relative adrenal insufficiency: Patient was on  Solu-Medrol 50 mg IV every 6 hours which was tapered and eventually switched to prednisone 40 mg daily on 4/29 x 5 days.  Not on steroids PTA.  Hypomagnesemia Replaced.  Hypokalemia Replaced.  Chronic atrial flutter On 4/29 reviewed, patient with persistent sinus tachycardia in the 120s, unclear etiology.  Appears euvolemic.  On lower than home dose of Cardizem CD due to soft blood pressures.  Metoprolol was increased to 25 Mg twice daily.  Continued on digoxin 0.125 Mg daily.  Digoxin level 1.3/therapeutic.  Xarelto for anticoagulation.  Dyspnea resolved.  Since approximately 11 AM yesterday, patient had reverted to sinus rhythm but having paroxysmal tachycardia.  Could consider increasing her Cardizem CD to home dose of 240 mg daily.  If ongoing issues, may consider cardiology consultation  Leg wounds, POA Continue local wound care  Diarrhea/history of ulcerative colitis Followed by [facility].  Communicated with [facility].  C. difficile and GI panel PCR negative.  Imodium as needed.  Changed dietary supplements from Glucerna to boost.  Improved.  Appetite improving.  Essential hypertension Controlled on Cardizem CD 120 Mg daily and metoprolol 25 Mg twice daily.  Anemia of chronic disease Stable.  Anxiety Continue Lexapro, hydroxyzine and Klonopin.  History of breast cancer No longer on tamoxifen.  Failure to thrive/goals of care: As per home hospice team, focus on comfort but treat what can be treated with plans to return to ALF.   Body mass index is 22.65 kg/m.  Nutritional Status Nutrition Problem: Increased nutrient needs Etiology: acute illness (MRSA bacteremia) Signs/Symptoms: estimated needs Interventions: Ensure Enlive (each supplement provides 350kcal and 20 grams of protein)  Pressure Ulcer:  DVT prophylaxis:      Code Status: DNR Family Communication: I discussed in detail with patient's son via phone on 4/30, updated care and answered all questions.   According to his report, ALF will not be able to accept patient with her higher level of needs until Monday 5/2. Disposition:  Status is: Inpatient  Remains inpatient appropriate because:Inpatient level of care appropriate due to severity of illness   Dispo:  Patient From: Home  Planned Disposition: [place]  Medically stable for discharge: No          Consultants:   Infectious disease  Procedures:   None  Antimicrobials:    Anti-infectives (From admission, onward)   Start     Dose/Rate Route Frequency Ordered Stop   03/18/21 1000  Oritavancin Diphosphate (ORBACTIV) 1,200 mg in dextrose 5 % IVPB        1,200 mg 333.3 mL/hr over 180 Minutes Intravenous Once 03/18/21 0841 03/18/21 1335   03/17/21 2000  DAPTOmycin (CUBICIN) 400 mg in sodium chloride 0.9 % IVPB  Status:  Discontinued        8 mg/kg  49.3 kg 116 mL/hr over 30 Minutes Intravenous Daily 03/16/21 1023 03/18/21 0841   03/16/21 0800  vancomycin (VANCOREADY) IVPB 1000 mg/200 mL  Status:  Discontinued        1,000 mg 200 mL/hr over 60 Minutes Intravenous Every 48 hours 03/15/21 0538 03/16/21 1023   03/16/21 0600  vancomycin (VANCOREADY) IVPB 500 mg/100 mL  Status:  Discontinued        500 mg 100 mL/hr over 60 Minutes Intravenous Every 48 hours 03/14/21 0757 03/14/21 1657   03/15/21 1400  metroNIDAZOLE (FLAGYL) tablet 500 mg        500 mg Oral Every 8 hours 03/15/21 1119     03/15/21 0800  ceFEPIme (MAXIPIME) 2 g in sodium chloride 0.9 % 100 mL IVPB  Status:  Discontinued        2 g 200 mL/hr over 30 Minutes Intravenous Every 24 hours 03/14/21 0757 03/14/21 1657   03/14/21 1800  Ampicillin-Sulbactam (UNASYN) 3 g in sodium chloride 0.9 % 100 mL IVPB  Status:  Discontinued        3 g 200 mL/hr over 30 Minutes Intravenous Every 8 hours 03/14/21 1706 03/15/21 1117   03/14/21 1745  Ampicillin-Sulbactam (UNASYN) 3 g in sodium chloride 0.9 % 100 mL IVPB  Status:  Discontinued        3 g 200 mL/hr over 30  Minutes Intravenous Every 6 hours 03/14/21 1657 03/14/21 1705   03/14/21 0645  ceFEPIme (MAXIPIME) 2 g in sodium chloride 0.9 % 100 mL IVPB        2 g 200 mL/hr over 30 Minutes Intravenous  Once 03/14/21 0632 03/14/21 1512   03/14/21 0645  metroNIDAZOLE (FLAGYL) IVPB 500 mg        500 mg 100 mL/hr over 60 Minutes Intravenous  Once 03/14/21 0632 03/14/21 1512   03/14/21 0645  vancomycin (VANCOCIN) IVPB 1000 mg/200 mL premix        1,000 mg 200 mL/hr over 60 Minutes Intravenous  Once 03/14/21 6811 03/14/21 1512        Subjective:  Poor historian.  Denies dyspnea or pain.  Cannot remember if she had multiple diarrhea yesterday or the day prior.  Per nursing, had 1 BM yesterday and a BM overnight.  Appetite is better.  No palpitations or dyspnea.  Objective:   Vitals:   03/20/21 2009 03/20/21 2203  03/21/21 0441 03/21/21 1319  BP: 106/73 107/67 (!) 118/59 110/81  Pulse: 77 80 68 (!) 134  Resp: 18  18 18   Temp: 97.6 F (36.4 C)  (!) 97.5 F (36.4 C) (!) 97.4 F (36.3 C)  TempSrc: Oral  Oral Oral  SpO2: 97%  99%   Weight:      Height:        General exam: Elderly female, small built, chronically ill looking and frail, lying propped up in bed. Respiratory system: Slightly diminished breath sounds in the bases but otherwise clear to auscultation.  No increased work of breathing. Cardiovascular system: S1 and S2 heard, RRR.  No JVD, murmurs and trace ankle edema.  Telemetry personally reviewed: SR/paroxysmal tachycardia-?  A. fib. Gastrointestinal system: Abdomen is nondistended, soft and nontender. No organomegaly or masses felt. Normal bowel sounds heard. Central nervous system: Alert and oriented x2. No focal neurological deficits. Extremities: Symmetric 5 x 5 power. Skin: No rashes, lesions or ulcers Psychiatry: Judgement and insight appear impaired. Mood & affect flat.     Data Reviewed:   I have personally reviewed following labs and imaging studies   CBC: Recent Labs   Lab Apr 12, 2021 0241 03/20/21 0342 03/21/21 0309  WBC 14.0* 13.1* 10.8*  HGB 10.4* 10.2* 9.6*  HCT 31.3* 30.8* 29.0*  MCV 90.5 91.4 92.4  PLT 186 161 466    Basic Metabolic Panel: Recent Labs  Lab 04/12/21 0241 03/20/21 0342 03/20/21 1557 03/21/21 0309 03/21/21 0602  NA 135 136  --  135  --   K 3.4* 3.6  --  4.8  --   CL 103 105  --  104  --   CO2 23 26  --  26  --   GLUCOSE 124* 95  --  118*  --   BUN 21 20  --  14  --   CREATININE 0.72 0.63  --  0.61  --   CALCIUM 8.1* 7.9*  --  7.8*  --   MG 1.5*  --  1.7  --  2.3    Liver Function Tests: No results for input(s): AST, ALT, ALKPHOS, BILITOT, PROT, ALBUMIN in the last 168 hours.  CBG: No results for input(s): GLUCAP in the last 168 hours.  Microbiology Studies:   Recent Results (from the past 240 hour(s))  Resp Panel by RT-PCR (Flu A&B, Covid) Nasopharyngeal Swab     Status: None   Collection Time: 03/14/21  6:31 AM   Specimen: Nasopharyngeal Swab; Nasopharyngeal(NP) swabs in vial transport medium  Result Value Ref Range Status   SARS Coronavirus 2 by RT PCR NEGATIVE NEGATIVE Final    Comment: (NOTE) SARS-CoV-2 target nucleic acids are NOT DETECTED.  The SARS-CoV-2 RNA is generally detectable in upper respiratory specimens during the acute phase of infection. The lowest concentration of SARS-CoV-2 viral copies this assay can detect is 138 copies/mL. A negative result does not preclude SARS-Cov-2 infection and should not be used as the sole basis for treatment or other patient management decisions. A negative result may occur with  improper specimen collection/handling, submission of specimen other than nasopharyngeal swab, presence of viral mutation(s) within the areas targeted by this assay, and inadequate number of viral copies(<138 copies/mL). A negative result must be combined with clinical observations, patient history, and epidemiological information. The expected result is Negative.  Fact Sheet for  Patients:  EntrepreneurPulse.com.au  Fact Sheet for Healthcare Providers:  IncredibleEmployment.be  This test is no t yet approved or cleared by the [place]  States FDA and  has been authorized for detection and/or diagnosis of SARS-CoV-2 by FDA under an Emergency Use Authorization (EUA). This EUA will remain  in effect (meaning this test can be used) for the duration of the COVID-19 declaration under Section 564(b)(1) of the Act, 21 U.S.C.section 360bbb-3(b)(1), unless the authorization is terminated  or revoked sooner.       Influenza A by PCR NEGATIVE NEGATIVE Final   Influenza B by PCR NEGATIVE NEGATIVE Final    Comment: (NOTE) The Xpert Xpress SARS-CoV-2/FLU/RSV plus assay is intended as an aid in the diagnosis of influenza from Nasopharyngeal swab specimens and should not be used as a sole basis for treatment. Nasal washings and aspirates are unacceptable for Xpert Xpress SARS-CoV-2/FLU/RSV testing.  Fact Sheet for Patients: EntrepreneurPulse.com.au  Fact Sheet for Healthcare Providers: IncredibleEmployment.be  This test is not yet approved or cleared by the [place] FDA and has been authorized for detection and/or diagnosis of SARS-CoV-2 by FDA under an Emergency Use Authorization (EUA). This EUA will remain in effect (meaning this test can be used) for the duration of the COVID-19 declaration under Section 564(b)(1) of the Act, 21 U.S.C. section 360bbb-3(b)(1), unless the authorization is terminated or revoked.  Performed at [facility]   Blood Culture (routine x 2)     Status: Abnormal   Collection Time: 03/14/21  6:31 AM   Specimen: BLOOD  Result Value Ref Range Status   Specimen Description   Final    BLOOD BLOOD LEFT FOREARM Performed at [facility]    Special Requests   Final    BOTTLES DRAWN AEROBIC AND ANAEROBIC Blood Culture adequate  volume Performed at [facility]    Culture  Setup Time   Final    GRAM POSITIVE COCCI ANAEROBIC BOTTLE ONLY CRITICAL VALUE NOTED.  VALUE IS CONSISTENT WITH PREVIOUSLY REPORTED AND CALLED VALUE.    Culture (A)  Final    STAPHYLOCOCCUS AUREUS SUSCEPTIBILITIES PERFORMED ON PREVIOUS CULTURE WITHIN THE LAST 5 DAYS. Performed at [facility], [place] [address], [place], [place] [address]    Report Status 03/17/2021 FINAL  Final  Group A Strep by PCR     Status: None   Collection Time: 03/14/21  6:31 AM   Specimen: Peripheral; Sterile Swab  Result Value Ref Range Status   Group A Strep by PCR NOT DETECTED NOT DETECTED Final    Comment: Performed at [facility]  Blood Culture (routine x 2)     Status: Abnormal   Collection Time: 03/14/21  6:36 AM   Specimen: BLOOD  Result Value Ref Range Status   Specimen Description   Final    BLOOD BLOOD LEFT WRIST Performed at [facility]    Special Requests   Final    BOTTLES DRAWN AEROBIC AND ANAEROBIC Blood Culture adequate volume Performed at [facility]    Culture  Setup Time   Final    GRAM POSITIVE COCCI AEROBIC BOTTLE ONLY CRITICAL RESULT CALLED TO, READ BACK BY AND VERIFIED WITH: Beulah Gandy 03/15/2021 at [place].Ysidro Evert Performed at [facility], [place] [address] [place], [place] [address]    Culture METHICILLIN RESISTANT STAPHYLOCOCCUS AUREUS (A)  Final   Report Status 03/17/2021 FINAL  Final   Organism ID, Bacteria METHICILLIN RESISTANT STAPHYLOCOCCUS AUREUS  Final      Susceptibility   Methicillin resistant staphylococcus aureus - MIC*    CIPROFLOXACIN >=8 RESISTANT Resistant  ERYTHROMYCIN >=8 RESISTANT Resistant     GENTAMICIN <=0.5 SENSITIVE Sensitive     OXACILLIN >=4 RESISTANT Resistant     TETRACYCLINE <=1 SENSITIVE Sensitive     VANCOMYCIN <=0.5 SENSITIVE Sensitive     TRIMETH/SULFA >=320 RESISTANT Resistant     CLINDAMYCIN <=0.25  SENSITIVE Sensitive     RIFAMPIN <=0.5 SENSITIVE Sensitive     Inducible Clindamycin NEGATIVE Sensitive     * METHICILLIN RESISTANT STAPHYLOCOCCUS AUREUS  Blood Culture ID Panel (Reflexed)     Status: Abnormal   Collection Time: 03/14/21  6:36 AM  Result Value Ref Range Status   Enterococcus faecalis NOT DETECTED NOT DETECTED Final   Enterococcus Faecium NOT DETECTED NOT DETECTED Final   Listeria monocytogenes NOT DETECTED NOT DETECTED Final   Staphylococcus species DETECTED (A) NOT DETECTED Final    Comment: CRITICAL RESULT CALLED TO, READ BACK BY AND VERIFIED WITH: PHARMD JAMES LEDFORD 03/15/2021 AT 0503 A.HUGHES    Staphylococcus aureus (BCID) DETECTED (A) NOT DETECTED Final    Comment: Methicillin (oxacillin)-resistant Staphylococcus aureus (MRSA). MRSA is predictably resistant to beta-lactam antibiotics (except ceftaroline). Preferred therapy is vancomycin unless clinically contraindicated. Patient requires contact precautions if  hospitalized. CRITICAL RESULT CALLED TO, READ BACK BY AND VERIFIED WITH: PHARMD JAMES LEDFORD 03/15/2021 AT 0503 A.HUGHES    Staphylococcus epidermidis NOT DETECTED NOT DETECTED Final   Staphylococcus lugdunensis NOT DETECTED NOT DETECTED Final   Streptococcus species NOT DETECTED NOT DETECTED Final   Streptococcus agalactiae NOT DETECTED NOT DETECTED Final   Streptococcus pneumoniae NOT DETECTED NOT DETECTED Final   Streptococcus pyogenes NOT DETECTED NOT DETECTED Final   A.calcoaceticus-baumannii NOT DETECTED NOT DETECTED Final   Bacteroides fragilis NOT DETECTED NOT DETECTED Final   Enterobacterales NOT DETECTED NOT DETECTED Final   Enterobacter cloacae complex NOT DETECTED NOT DETECTED Final   Escherichia coli NOT DETECTED NOT DETECTED Final   Klebsiella aerogenes NOT DETECTED NOT DETECTED Final   Klebsiella oxytoca NOT DETECTED NOT DETECTED Final   Klebsiella pneumoniae NOT DETECTED NOT DETECTED Final   Proteus species NOT DETECTED NOT DETECTED  Final   Salmonella species NOT DETECTED NOT DETECTED Final   Serratia marcescens NOT DETECTED NOT DETECTED Final   Haemophilus influenzae NOT DETECTED NOT DETECTED Final   Neisseria meningitidis NOT DETECTED NOT DETECTED Final   Pseudomonas aeruginosa NOT DETECTED NOT DETECTED Final   Stenotrophomonas maltophilia NOT DETECTED NOT DETECTED Final   Candida albicans NOT DETECTED NOT DETECTED Final   Candida auris NOT DETECTED NOT DETECTED Final   Candida glabrata NOT DETECTED NOT DETECTED Final   Candida krusei NOT DETECTED NOT DETECTED Final   Candida parapsilosis NOT DETECTED NOT DETECTED Final   Candida tropicalis NOT DETECTED NOT DETECTED Final   Cryptococcus neoformans/gattii NOT DETECTED NOT DETECTED Final   Meth resistant mecA/C and MREJ DETECTED (A) NOT DETECTED Final    Comment: CRITICAL RESULT CALLED TO, READ BACK BY AND VERIFIED WITH: PHARMD JAMES LEDFORD 03/15/2021 AT 0503 A.HUGHES Performed at [facility], [place] [address], [place], [place] [address]   Urine culture     Status: None   Collection Time: 03/14/21 10:44 AM   Specimen: In/Out Cath Urine  Result Value Ref Range Status   Specimen Description   Final    IN/OUT CATH URINE Performed at [facility]    Special Requests NONE Performed at [facility]   Final   Culture   Final    NO GROWTH Performed at [facility],  [address], [place], [place] [address]    Report Status 03/15/2021 FINAL  Final  MRSA PCR Screening     Status: Abnormal   Collection Time: 03/14/21  6:00 PM   Specimen: Nasopharyngeal  Result Value Ref Range Status   MRSA by PCR POSITIVE (A) NEGATIVE Final    Comment:        The GeneXpert MRSA Assay (FDA approved for NASAL specimens only), is one component of a comprehensive MRSA colonization surveillance program. It is not intended to diagnose MRSA infection nor to guide or monitor treatment for MRSA infections. RESULT CALLED TO, READ BACK  BY AND VERIFIED WITH: K,HOLDEN RN @2022  03/14/21 EB Performed at [place] [address], [place], [place] [address]   Respiratory (~20 pathogens) panel by PCR     Status: None   Collection Time: 03/14/21  6:30 PM  Result Value Ref Range Status   Adenovirus NOT DETECTED NOT DETECTED Final   Coronavirus 229E NOT DETECTED NOT DETECTED Final    Comment: (NOTE) The Coronavirus on the Respiratory Panel, DOES NOT test for the novel  Coronavirus (2019 nCoV)    Coronavirus HKU1 NOT DETECTED NOT DETECTED Final   Coronavirus NL63 NOT DETECTED NOT DETECTED Final   Coronavirus OC43 NOT DETECTED NOT DETECTED Final   Metapneumovirus NOT DETECTED NOT DETECTED Final   Rhinovirus / Enterovirus NOT DETECTED NOT DETECTED Final   Influenza A NOT DETECTED NOT DETECTED Final   Influenza B NOT DETECTED NOT DETECTED Final   Parainfluenza Virus 1 NOT DETECTED NOT DETECTED Final   Parainfluenza Virus 2 NOT DETECTED NOT DETECTED Final   Parainfluenza Virus 3 NOT DETECTED NOT DETECTED Final   Parainfluenza Virus 4 NOT DETECTED NOT DETECTED Final   Respiratory Syncytial Virus NOT DETECTED NOT DETECTED Final   Bordetella pertussis NOT DETECTED NOT DETECTED Final   Bordetella Parapertussis NOT DETECTED NOT DETECTED Final   Chlamydophila pneumoniae NOT DETECTED NOT DETECTED Final   Mycoplasma pneumoniae NOT DETECTED NOT DETECTED Final    Comment: Performed at [facility], [place] [address] [place], [place] [address]  C Difficile Quick Screen w PCR reflex     Status: None   Collection Time: 03/15/21  6:00 PM   Specimen: STOOL  Result Value Ref Range Status   C Diff antigen NEGATIVE NEGATIVE Final   C Diff toxin NEGATIVE NEGATIVE Final   C Diff interpretation No C. difficile detected.  Final    Comment: Performed at [facility], [place] [address], [place], [place] [address]  Culture, blood (routine x 2)     Status: None   Collection Time: 03/16/21  9:28 AM   Specimen: BLOOD  Result Value  Ref Range Status   Specimen Description BLOOD RIGHT ANTECUBITAL  Final   Special Requests   Final    BOTTLES DRAWN AEROBIC AND ANAEROBIC Blood Culture adequate volume   Culture   Final    NO GROWTH 5 DAYS Performed at [facility], [place] [address] [place], [place] [address]    Report Status 03/21/2021 FINAL  Final  Culture, blood (routine x 2)     Status: None   Collection Time: 03/16/21  9:28 AM   Specimen: BLOOD  Result Value Ref Range Status   Specimen Description BLOOD BLOOD RIGHT HAND  Final   Special Requests   Final    BOTTLES DRAWN AEROBIC AND ANAEROBIC Blood Culture results may not be optimal due to an inadequate volume of  blood received in culture bottles   Culture   Final    NO GROWTH 5 DAYS Performed at [facility], [place] [address] [place], [place] [address]    Report Status 03/21/2021 FINAL  Final  Culture, blood (routine x 2)     Status: None (Preliminary result)   Collection Time: 03/17/21  3:12 AM   Specimen: BLOOD LEFT HAND  Result Value Ref Range Status   Specimen Description BLOOD LEFT HAND  Final   Special Requests   Final    BOTTLES DRAWN AEROBIC ONLY Blood Culture adequate volume   Culture   Final    NO GROWTH 4 DAYS Performed at [facility], [place] [address] [place], [place] [address]    Report Status PENDING  Incomplete  Culture, blood (routine x 2)     Status: None (Preliminary result)   Collection Time: 03/17/21  3:13 AM   Specimen: BLOOD  Result Value Ref Range Status   Specimen Description BLOOD SITE NOT SPECIFIED  Final   Special Requests   Final    BOTTLES DRAWN AEROBIC ONLY Blood Culture adequate volume   Culture   Final    NO GROWTH 4 DAYS Performed at [facility], [place] [address] [place], [place] [address]    Report Status PENDING  Incomplete  Gastrointestinal Panel by PCR , Stool     Status: None   Collection Time: 03/19/21 10:03 AM   Specimen: Stool  Result Value Ref Range Status   Campylobacter species NOT  DETECTED NOT DETECTED Final   Plesimonas shigelloides NOT DETECTED NOT DETECTED Final   Salmonella species NOT DETECTED NOT DETECTED Final   Yersinia enterocolitica NOT DETECTED NOT DETECTED Final   Vibrio species NOT DETECTED NOT DETECTED Final   Vibrio cholerae NOT DETECTED NOT DETECTED Final   Enteroaggregative E coli (EAEC) NOT DETECTED NOT DETECTED Final   Enteropathogenic E coli (EPEC) NOT DETECTED NOT DETECTED Final   Enterotoxigenic E coli (ETEC) NOT DETECTED NOT DETECTED Final   Shiga like toxin producing E coli (STEC) NOT DETECTED NOT DETECTED Final   Shigella/Enteroinvasive E coli (EIEC) NOT DETECTED NOT DETECTED Final   Cryptosporidium NOT DETECTED NOT DETECTED Final   Cyclospora cayetanensis NOT DETECTED NOT DETECTED Final   Entamoeba histolytica NOT DETECTED NOT DETECTED Final   Giardia lamblia NOT DETECTED NOT DETECTED Final   Adenovirus F40/41 NOT DETECTED NOT DETECTED Final   Astrovirus NOT DETECTED NOT DETECTED Final   Norovirus GI/GII NOT DETECTED NOT DETECTED Final   Rotavirus A NOT DETECTED NOT DETECTED Final   Sapovirus (I, II, IV, and V) NOT DETECTED NOT DETECTED Final    Comment: Performed at [facility], [address], [place], [place] [address]     Radiology Studies:  DG CHEST PORT 1 VIEW  Result Date: 03/20/2021 CLINICAL DATA:  80 year old female with shortness of breath.  MRSA. EXAM: PORTABLE CHEST 1 VIEW COMPARISON:  Portable chest 03/14/2021 and earlier. FINDINGS: Portable AP semi upright view at 0958 hours. Lower lung volumes with new bibasilar veiling opacity. No pneumothorax. Pulmonary vascularity within normal limits. Stable cardiac size and mediastinal contours. Postoperative changes to the heart and mediastinum. Chronic thoracic vertebral augmentation. Visualized tracheal air column is within normal limits. Stable visible bowel gas pattern. IMPRESSION: Lower lung volumes with evidence of bilateral pleural effusions, new or increased since  03/14/2021. No pulmonary edema. Electronically Signed   By: Genevie Ann M.D.   On: 03/20/2021 10:12  Scheduled Meds:   . collagenase   Topical Daily  . digoxin  0.0625 mg Oral Daily  . diltiazem  120 mg Oral Daily  . escitalopram  10 mg Oral Daily  . feeding supplement  1 Container Oral TID BM  . hydrOXYzine  25 mg Oral BID  . Mesalamine  400 mg Oral Daily  . metoprolol tartrate  25 mg Oral BID  . metroNIDAZOLE  500 mg Oral Q8H  . predniSONE  40 mg Oral Q breakfast  . Rivaroxaban  15 mg Oral QHS  . sodium chloride flush  3 mL Intravenous Q12H    Continuous Infusions:   . sodium chloride 500 mL (03/19/21 1502)     LOS: 7 days     Vernell Leep, MD, [place], [facility]. [facility]    To contact the attending provider between 7A-7P or the covering provider during after hours 7P-7A, please log into the web site [email or web address] and access using universal [place] password for that web site. If you do not have the password, please call the hospital operator.  03/21/2021, 2:57 PM

## 2021-03-21 NOTE — Progress Notes (Signed)
[name] [id number] Hospice Patient [facility])  Mylo Driskill [facility] of [facility]. Facility notified [facility] on callstaffthat patient was experiencing swelling on therightside of herneck withassociatedpain. After assessment and discussing with patient's son, the decision was made to have EMS transport her to ED for evaluation. Patient was admitted to [facility] on 4/23 with a diagnosis ofsepsis due to acute parotiditis and MRSA bacteremia.Per [facility] physician Dr. Gilford Rile, this is a related admission.  Visited pt and son [name] and daughter-in-law at bedside.  Pt AOx2, reports feeling "tired," endorses anxiety, verbalizes wish for help with this.  Pt denies dyspnea and pain greater than baseline although she repeatedly asks for blankets to be moved on RLE due to discomfort.  Gently repositioning attempted without relief.  Pt noted to be tachycardic with HR > 130.  Pt denies appetite, drinking Boost but otherwise poor PO intake.  Report exchanged with bedsdie RN [name] who reports pt resists repositioning, denies desire to eat, denies PRN pain meds.  Dr, Lindaann Pascal made aware of persistent anxiety,  Clonazepam ordered on scheule.  Son [name] verbalizes desire to continue to treat and to adhere to the current plan of care.    Pt is appropraite for contued GIP status due to unmanaged tachycardia and new consult to cardiology as well as need to monitor effectiveness of new symptom management regime.  V/S: 97.4 oral, 110/81, HR 134, RR 18, SPO2 99% on RA I&O: 1297/ no recorded output for last 24 hours however clear amber urine noted in purewick container Labs: Ca 7.8, WBC 10.8, hgb 9.6 Diagnostics: none new IVs/PRNs: NS @ KVO, PRN clonazepam  Problem List: - sepsis due to acute parodititis and MRSA bacteremia - As per ID sign off note 4/27, 4 weeks of linezolid to be started 10 days after  dose of oritavancin, for empirically treating endocarditis if repeat blood cultures are negative in 48 hours and, continue metronidazole x10 days, monitor CBC at least every 2 weeks while on linezolid. - a. Fib/a. Flutter - was on diltiazem drip > today 4/30 tachycardic, HR>135, cards consulted -- diarrhea - patient reports no episodes today, c-diff PCR was negative, registered dietician changed glucerna to boost, two episodes in past 24 hours  GOC: clear, focus on comfort but treat what can be treated D/C planning: return to ALF. Son contacted ALF to discuss increasing her level of assistance to a 6/6, he advised that [facility] can take her back once she is medically optimized and for the hospital TOC to assist once ready. She will be prescribed linezolid for 4 weeks and need a CBC every two weeks.  IDT: hospice team updated Family: updated [name] and his wife at bedside  [name], BSN, [employer] [phone]

## 2021-03-21 NOTE — Progress Notes (Signed)
Spoke to [place] and Dr. Algis Liming regarding isolation precautions. Patient will remain on contact precautions for hx of ESBL and discontinue enteric precautions since GI panel and C-diff were all negative. All in agreement and orders adjusted

## 2021-03-21 NOTE — Progress Notes (Signed)
Pt heart rate continues 110-130.  Pt c/o anxiety, otherwise no other c/o.  Cardiology at bedside after shift change.  No new orders at present.  Will continue to monitor patient closely.

## 2021-03-21 NOTE — Consult Note (Signed)
Cardiology Consultation:   Patient ID: Michele Green MRN: [id number]; DOB: 11/11/1941  Admit date: 03/14/2021 Date of Consult: 03/21/2021  PCP:  [name], [place]  Cardiologist:  Sinclair Grooms, MD  Advanced Practice Provider:  No care team member to display Electrophysiologist:  Will Meredith Leeds, MD        Patient Profile:   Michele Green is a 80 y.o. female with a hx of HFpEF, ulcerative colitis, pAFL, CAD s/p CABG who is being seen today for the evaluation of atrial arrhyhtmia   at the request of hospital medicine.  History of Present Illness:   Michele Green  a 80 y.o. female with a hx of HFpEF, ulcerative colitis, pAFL, CAD s/p CABG who is admitted for MRSA bacteremia and parotis. This has been treated and unfortunately Michele Green has gone back into atrial flutter. She has had this ongoing for years and has had MAZE procedure, failed ablations and failed cardioversion. Rate control was working some, however these had to be adjusted due to low blood pressures.   Cardiology is being consulted for persistent AFL now and for recs on management.  On my exam she appears euvolemic and is asymptomatic from a cardiac perspective. Feels ongoing fatigue.    Past Medical History:  Diagnosis Date  . Arrhythmia   . Atypical atrial flutter ([place]) 08/14/2018   Post-operative  . Breast cancer of upper-outer quadrant of left female breast ([place]) 02/02/2016  . CAD (coronary artery disease) 06/20/2018   LHC 7/19: pLAD 65/90, oD1 90, mLCx 85, OM2 50, oRCA 30, EF 50-55 >> s/p CABG in 9/19 (L-LAD)  . Colon polyp 02/2012  . Dupuytren contracture    bilateral hands  . Family history of breast cancer   . Hyperlipidemia   . Hypertension   . Mitral regurgitation 11/07/2013   Echo 7/19: Mild LVH, EF 60-65, no RWMA, mod ot severe MR, massive LAE, PASP 33 // TEE 7/19:  Mild conc LVH, EF 60-65, no RWMA, severe MR with mild post leaflet prolapse, massive // s/p  MV repair 07/2018  . On continuous oral anticoagulation 08/22/2015   Started on Xarelto 08/12/2015   . Osteopenia   . Persistent atrial fibrillation ([place]) 08/22/2015   Started late August or early September 2016 // s/p Maze procedure 07/2018  . Personal history of radiation therapy   . Radiation Therapy 04/21/16-05/19/16   left breast 47.72 Gy, boosted to 10 Gy  . 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 S/P Maze operation for atrial fibrillation 08/10/2018   Complete bilateral atrial lesion set using bipolar radiofrequency and cryothermy ablation with clipping of LA appendage  . S/P MVR (mitral valve repair) 08/10/2018   Complex valvuloplasty including Gore-tex neochord placement x8, Plication of Lateral Commissure and 15m Sorin Memo 4D Ring Annuloplasty SN# [id number] . [place]   . Wears glasses     Past Surgical History:  Procedure Laterality Date  . BIOPSY  07/14/2020   Procedure: BIOPSY;  Surgeon: KRonnette Juniper MD;  Location: [facility];  Service: Gastroenterology;;  . BREAST BIOPSY Left 01/28/2016  . BREAST LUMPECTOMY Left 02/23/2016  . BREAST LUMPECTOMY WITH RADIOACTIVE SEED LOCALIZATION Left 02/23/2016   Procedure: BREAST LUMPECTOMY WITH RADIOACTIVE SEED LOCALIZATION;  Surgeon: BExcell Seltzer MD;  [place]  Service: General;  Laterality: Left;  . CARDIAC CATHETERIZATION    . CARDIOVERSION N/A 10/02/2015   Procedure: CARDIOVERSION;  Surgeon: Jerline Pain, MD;  Location: [facility] ENDOSCOPY;  Service: Cardiovascular;  Laterality: N/A;  . CARDIOVERSION N/A 10/05/2018   Procedure: CARDIOVERSION;  Surgeon: Fay Records, MD;  Location: [facility] ENDOSCOPY;  Service: Cardiovascular;  Laterality: N/A;  . CARDIOVERSION N/A 11/05/2019   Procedure: CARDIOVERSION;  Surgeon: Dorothy Spark, MD;  Location: [facility] ENDOSCOPY;  Service: Cardiovascular;  Laterality: N/A;  . CARDIOVERSION N/A 01/29/2021   Procedure: CARDIOVERSION;  Surgeon: Donato Heinz, MD;   Location: [place];  Service: Cardiovascular;  Laterality: N/A;  . CLIPPING OF ATRIAL APPENDAGE  08/10/2018   Procedure: CLIPPING OF ATRIAL APPENDAGE;  Surgeon: Rexene Alberts, MD;  Location: [place];  Service: Open Heart Surgery;;  . COLONOSCOPY    . COLONOSCOPY WITH PROPOFOL N/A 07/14/2020   Procedure: COLONOSCOPY WITH PROPOFOL;  Surgeon: Ronnette Juniper, MD;  Location: [facility];  Service: Gastroenterology;  Laterality: N/A;  . CORONARY ARTERY BYPASS GRAFT N/A 08/10/2018   Procedure: CORONARY ARTERY BYPASS GRAFTING (CABG) x 1, LIMA-LAD,  USING LEFT INTERNAL MAMMARY ARTERY. HARVESTED RIGHT GREATER SAPHENOUS VEIN ENDOSCOPICALLY;  Surgeon: Rexene Alberts, MD;  Location: [place];  Service: Open Heart Surgery;  Laterality: N/A;  . CYSTOSCOPY W/ RETROGRADES Bilateral 06/20/2019   Procedure: CYSTOSCOPY WITH RETROGRADE PYELOGRAM;  Surgeon: Ardis Hughs, MD;  Location[facility];  Service: Urology;  Laterality: Bilateral;  . CYSTOSCOPY WITH HYDRODISTENSION AND BIOPSY N/A 06/20/2019   Procedure: CYSTOSCOPY BLADDER  BIOPSY WITH FULGERATION;  Surgeon: Ardis Hughs, MD;  Location: [facility];  Service: Urology;  Laterality: N/A;  . DUPUYTREN CONTRACTURE RELEASE  2001   leftx2  . [place]   right  . DUPUYTREN CONTRACTURE RELEASE Right 05/02/2014   Procedure: EXCISION DUPUYTRENS RIGHT PALMAR/SMALL ;  Surgeon: Cammie Sickle, MD;  Location: [place];  Service: Orthopedics;  Laterality: Right;  . HEMOSTASIS CLIP PLACEMENT  07/14/2020   Procedure: HEMOSTASIS CLIP PLACEMENT;  Surgeon: Ronnette Juniper, MD;  Location: [facility];  Service: Gastroenterology;;  . IR VERTEBROPLASTY CERV/THOR BX INC UNI/BIL INC/INJECT/IMAGING  08/19/2020  . IR VERTEBROPLASTY CERV/THOR BX INC UNI/BIL INC/INJECT/IMAGING  09/15/2020  . MAZE N/A 08/10/2018   Procedure: MAZE;  Surgeon: Rexene Alberts, MD;  Location: [place];  Service: Open Heart  Surgery;  Laterality: N/A;  . MITRAL VALVE REPAIR N/A 08/10/2018   Procedure: MITRAL VALVE REPAIR (MVR);  Surgeon: Rexene Alberts, MD;  Location: [place];  Service: Open Heart Surgery;  Laterality: N/A;  glutaraldehyde  . POLYPECTOMY  07/14/2020   Procedure: POLYPECTOMY;  Surgeon: Ronnette Juniper, MD;  Location: [facility];  Service: Gastroenterology;;  . RIGHT/LEFT HEART CATH AND CORONARY ANGIOGRAPHY N/A 06/20/2018   Procedure: RIGHT/LEFT HEART CATH AND CORONARY ANGIOGRAPHY;  Surgeon: [name], MD;  Location: [place] CV LAB;  Service: Cardiovascular;  Laterality: N/A;  . TEE WITHOUT CARDIOVERSION N/A 06/20/2018   Procedure: TRANSESOPHAGEAL ECHOCARDIOGRAM (TEE);  Surgeon: Pixie Casino, MD;  Location: [facility] ENDOSCOPY;  Service: Cardiovascular;  Laterality: N/A;  . TEE WITHOUT CARDIOVERSION N/A 08/10/2018   Procedure: TRANSESOPHAGEAL ECHOCARDIOGRAM (TEE);  Surgeon: Rexene Alberts, MD;  Location: [place];  Service: Open Heart Surgery;  Laterality: N/A;  . TONSILLECTOMY  age 79     Home Medications:  Prior to Admission medications   Medication Sig Start Date End Date Taking? Authorizing Provider  acetaminophen (TYLENOL) 500 MG tablet Take 1,000 mg by mouth every 6 (six) hours as needed for headache (pain).  Yes [provider]  bisacodyl (DULCOLAX) 10 MG suppository Place 10 mg rectally daily as needed for moderate constipation.   Yes [provider]  camphor-menthol Timoteo Ace) lotion Apply 1 application topically See admin instructions. Apply lotion topically to patient's back twice daily for itching   Yes [provider]  ciprofloxacin (CIPRO) 500 MG tablet Take 500 mg by mouth 2 (two) times daily.   Yes [provider]  clonazePAM (KLONOPIN) 0.5 MG tablet Take 0.5 mg by mouth 2 (two) times daily as needed (seizures).   Yes [provider]  digoxin 62.5 MCG TABS Take 0.0625 mg by mouth daily. 02/03/21  Yes Thurnell Lose, MD  diltiazem (CARDIZEM  CD) 240 MG 24 hr capsule Take 1 capsule (240 mg total) by mouth daily. 02/02/21  Yes Thurnell Lose, MD  escitalopram (LEXAPRO) 10 MG tablet Take 10 mg by mouth daily.   Yes [provider]  furosemide (LASIX) 40 MG tablet Take 1 tablet (40 mg total) by mouth daily. 02/02/21  Yes Thurnell Lose, MD  haloperidol (HALDOL) 0.5 MG tablet Take 0.5 mg by mouth every 6 (six) hours.   Yes [provider]  hydrOXYzine (ATARAX/VISTARIL) 25 MG tablet Take 25 mg by mouth in the morning and at bedtime. 01/14/21  Yes [provider]  mesalamine (APRISO) 0.375 g 24 hr capsule Take 375 mg by mouth every morning.   Yes [provider]  mirtazapine (REMERON) 7.5 MG tablet Take 7.5 mg by mouth at bedtime.   Yes [provider]  oxyCODONE (OXY IR/ROXICODONE) 5 MG immediate release tablet Take 1 tablet (5 mg total) by mouth every 6 (six) hours as needed for severe pain. Patient taking differently: Take 5 mg by mouth every 6 (six) hours as needed for breakthrough pain. 02/02/21  Yes Thurnell Lose, MD  Oxycodone HCl 10 MG TABS Take 10 mg by mouth every 4 (four) hours.   Yes [provider]  OXYGEN Inhale 2 L into the lungs continuous.   Yes [provider]  potassium chloride 20 MEQ TBCR Take 20 mEq by mouth daily. 02/02/21  Yes Thurnell Lose, MD  senna-docusate (SENOKOT-S) 8.6-50 MG tablet Take 1 tablet by mouth daily.   Yes [provider]  XARELTO 15 MG TABS tablet TAKE 1 TABLET BY MOUTH  DAILY WITH SUPPER Patient taking differently: Take 15 mg by mouth daily with supper. 10/06/20  Yes [name], MD    Inpatient Medications: Scheduled Meds: . clonazePAM  0.5 mg Oral BID  . collagenase   Topical Daily  . digoxin  0.0625 mg Oral Daily  . diltiazem  120 mg Oral Daily  . escitalopram  10 mg Oral Daily  . feeding supplement  1 Container Oral TID BM  . hydrOXYzine  25 mg Oral BID  . Mesalamine  400 mg Oral Daily  . metoprolol  tartrate  25 mg Oral BID  . metroNIDAZOLE  500 mg Oral Q8H  . predniSONE  40 mg Oral Q breakfast  . Rivaroxaban  15 mg Oral QHS  . sodium chloride flush  3 mL Intravenous Q12H   Continuous Infusions: . sodium chloride 500 mL (03/19/21 1502)   PRN Meds: sodium chloride, acetaminophen **OR** acetaminophen, HYDROmorphone (DILAUDID) injection, loperamide, ondansetron **OR** ondansetron (ZOFRAN) IV, oxyCODONE  Allergies:    Allergies  Allergen Reactions  . Shellfish Allergy Itching  . Amoxicillin-Pot Clavulanate Diarrhea    Social History:   Social History   Socioeconomic History  .  Marital status: Widowed    Spouse name: Not on file  . Number of children: 1  . Years of education: Not on file  . Highest education level: Not on file  Occupational History  . Not on file  Tobacco Use  . Smoking status: Former Smoker    Packs/day: 1.00    Years: 30.00    Pack years: 30.00    Types: Cigarettes    Quit date: 11/22/1992    Years since quitting: 28.3  . Smokeless tobacco: Never Used  Vaping Use  . Vaping Use: Never used  Substance and Sexual Activity  . Alcohol use: Yes    Alcohol/week: 3.0 standard drinks    Types: 3 Standard drinks or equivalent per week    Comment: social  . Drug use: No  . Sexual activity: Not Currently    Partners: Male    Birth control/protection: Post-menopausal  Other Topics Concern  . Not on file  Social History Narrative  . Not on file   Social Determinants of Health   Financial Resource Strain: Not on file  Food Insecurity: Not on file  Transportation Needs: Not on file  Physical Activity: Not on file  Stress: Not on file  Social Connections: Not on file  Intimate Partner Violence: Not on file    Family History:    Family History  Problem Relation Age of Onset  . Cirrhosis Mother   . Breast cancer Mother 49  . Heart failure Father   . Heart attack Father   . Breast cancer Sister        early 75's  . Kidney Stones Sister         loss of kidney due to stones  . Osteoporosis Neg Hx      ROS:  Please see the history of present illness.   All other ROS reviewed and negative.     Physical Exam/Data:   Vitals:   03/21/21 1319 03/21/21 1745 03/21/21 1800 03/21/21 2034  BP: 110/81 92/70 101/71 107/71  Pulse: (!) 134 (!) 132  (!) 136  Resp: 18  20 18   Temp: (!) 97.4 F (36.3 C)   (!) 97.5 F (36.4 C)  TempSrc: Oral   Oral  SpO2:   95% 95%  Weight:      Height:        Intake/Output Summary (Last 24 hours) at 03/21/2021 2045 Last data filed at 03/21/2021 1800 Gross per 24 hour  Intake 118 ml  Output 650 ml  Net -532 ml   Last 3 Weights 03/20/2021 03/19/2021 03/18/2021  Weight (lbs) 127 lb 13.9 oz 117 lb 15.1 oz 114 lb 6.7 oz  Weight (kg) 58 kg 53.5 kg 51.9 kg     Body mass index is 22.65 kg/m.  General:  Elderly female, NAD HEENT: normal Lymph: no adenopathy Neck: no JVD Endocrine:  No thryomegaly Vascular: No carotid bruits; FA pulses 2+ bilaterally without bruits  Cardiac:  normal S1, S2; RRR; no murmur  Lungs:  clear to auscultation bilaterally, no wheezing, rhonchi or rales  Abd: soft, nontender, no hepatomegaly  Ext: no edema Musculoskeletal:  No deformities, BUE and BLE strength normal and equal Skin: warm and dry  Neuro:  CNs 2-12 intact, no focal abnormalities noted Psych:  Normal affect   EKG:  The EKG was personally reviewed and demonstrates:  Atypical atrial flutter Telemetry:  Telemetry was personally reviewed and demonstrates:  Same   Relevant CV Studies: Echo with normal LV EF, severely enlarged LA  Laboratory Data:  High Sensitivity Troponin:  No results for input(s): TROPONINIHS in the last 720 hours.   Chemistry Recent Labs  Lab 03/19/21 0241 03/20/21 0342 03/21/21 0309  NA 135 136 135  K 3.4* 3.6 4.8  CL 103 105 104  CO2 23 26 26   GLUCOSE 124* 95 118*  BUN 21 20 14   CREATININE 0.72 0.63 0.61  CALCIUM 8.1* 7.9* 7.8*  GFRNONAA >60 >60 >60  ANIONGAP 9 5 5     No  results for input(s): PROT, ALBUMIN, AST, ALT, ALKPHOS, BILITOT in the last 168 hours. Hematology Recent Labs  Lab 03/19/21 0241 03/20/21 0342 03/21/21 0309  WBC 14.0* 13.1* 10.8*  RBC 3.46* 3.37* 3.14*  HGB 10.4* 10.2* 9.6*  HCT 31.3* 30.8* 29.0*  MCV 90.5 91.4 92.4  MCH 30.1 30.3 30.6  MCHC 33.2 33.1 33.1  RDW 15.4 15.6* 15.9*  PLT 186 161 153   BNPNo results for input(s): BNP, PROBNP in the last 168 hours.  DDimer No results for input(s): DDIMER in the last 168 hours.   Radiology/Studies:  DG CHEST PORT 1 VIEW  Result Date: 03/20/2021 CLINICAL DATA:  80 year old female with shortness of breath.  MRSA. EXAM: PORTABLE CHEST 1 VIEW COMPARISON:  Portable chest 03/14/2021 and earlier. FINDINGS: Portable AP semi upright view at 0958 hours. Lower lung volumes with new bibasilar veiling opacity. No pneumothorax. Pulmonary vascularity within normal limits. Stable cardiac size and mediastinal contours. Postoperative changes to the heart and mediastinum. Chronic thoracic vertebral augmentation. Visualized tracheal air column is within normal limits. Stable visible bowel gas pattern. IMPRESSION: Lower lung volumes with evidence of bilateral pleural effusions, new or increased since 03/14/2021. No pulmonary edema. Electronically Signed   By: Genevie Ann M.D.   On: 03/20/2021 10:12     Assessment and Plan:   1. Atypical Atrial Flutter - this has been a longstanding issue for Michele Green. Currently she is on reduced dose of home digoxin and lower dose of AVB (metop and dilt). It does not appear she would tolerate higher nodal blockade given her BP. I think we should first try increasing the digoxin back to 0.125 and see if that can provide better control. If not, then we can consider amiodarone. Given her lack of symptoms from the RVR, would not be aggressive at this time trying to slow rate. Will increase digoxin to 0.125 and cardiology will continue to follow.    Risk Assessment/Risk Scores:           CHA2DS2-VASc Score = 5  This indicates a 7.2% annual risk of stroke. The patient's score is based upon: CHF History: Yes HTN History: No Diabetes History: No Stroke History: No Vascular Disease History: Yes Age Score: 2 Gender Score: 1         For questions or updates, please contact [place] Please consult [email or web address] for contact info under    Signed, Doyne Keel, MD  03/21/2021 8:45 PM

## 2021-03-21 NOTE — Progress Notes (Signed)
Addendum  Hospice nurse came to visit earlier this afternoon, met with patient's son at bedside.  They noted that patient has been anxious daily for several days and requested that the Klonopin be changed to scheduled, done.  Patient back in tachyarrhythmia up to 130s, narrow complex, suspect atrial flutter versus A. fib, less likely sinus tachycardia.  Soft blood pressures with SBP in the 90s.  Patient asymptomatic and sleeping late this afternoon.  Already on digoxin 0.0625 mg daily, diltiazem 120 mg daily, metoprolol 25 mg twice daily.  No room to uptitrate diltiazem or metoprolol due to soft blood pressures.  Patient clinically euvolemic, not in pain, afebrile and not currently anxious.  Consulted cardiology for assistance.  Checking repeat EKG.  Vernell Leep, MD, [place], [facility]. [facility]  To contact the attending provider between 7A-7P or the covering provider during after hours 7P-7A, please log into the web site [email or web address] and access using universal [place] password for that web site. If you do not have the password, please call the hospital operator.

## 2021-03-22 DIAGNOSIS — R7881 Bacteremia: Secondary | ICD-10-CM

## 2021-03-22 DIAGNOSIS — B9562 Methicillin resistant Staphylococcus aureus infection as the cause of diseases classified elsewhere: Secondary | ICD-10-CM | POA: Diagnosis not present

## 2021-03-22 DIAGNOSIS — K1121 Acute sialoadenitis: Secondary | ICD-10-CM

## 2021-03-22 DIAGNOSIS — E271 Primary adrenocortical insufficiency: Secondary | ICD-10-CM

## 2021-03-22 LAB — CBC
HCT: 31 % — ABNORMAL LOW (ref 36.0–46.0)
Hemoglobin: 10.2 g/dL — ABNORMAL LOW (ref 12.0–15.0)
MCH: 30.3 pg (ref 26.0–34.0)
MCHC: 32.9 g/dL (ref 30.0–36.0)
MCV: 92 fL (ref 80.0–100.0)
Platelets: 172 10*3/uL (ref 150–400)
RBC: 3.37 MIL/uL — ABNORMAL LOW (ref 3.87–5.11)
RDW: 16.1 % — ABNORMAL HIGH (ref 11.5–15.5)
WBC: 10.8 10*3/uL — ABNORMAL HIGH (ref 4.0–10.5)
nRBC: 0 % (ref 0.0–0.2)

## 2021-03-22 LAB — CULTURE, BLOOD (ROUTINE X 2)
Culture: NO GROWTH
Culture: NO GROWTH
Special Requests: ADEQUATE
Special Requests: ADEQUATE

## 2021-03-22 LAB — BASIC METABOLIC PANEL
Anion gap: 5 (ref 5–15)
BUN: 14 mg/dL (ref 8–23)
CO2: 27 mmol/L (ref 22–32)
Calcium: 8.2 mg/dL — ABNORMAL LOW (ref 8.9–10.3)
Chloride: 104 mmol/L (ref 98–111)
Creatinine, Ser: 0.58 mg/dL (ref 0.44–1.00)
GFR, Estimated: >60 mL/min (ref >60)
Glucose, Bld: 158 mg/dL — ABNORMAL HIGH (ref 70–99)
Potassium: 4.9 mmol/L (ref 3.5–5.1)
Sodium: 136 mmol/L (ref 135–145)

## 2021-03-22 MED ORDER — DILTIAZEM HCL ER COATED BEADS 180 MG PO CP24
180.0000 mg | ORAL_CAPSULE | Freq: Every day | ORAL | Status: DC
  Administered 2021-03-22 – 2021-03-24 (×3): 180 mg via ORAL
  Filled 2021-03-22 (×3): qty 1

## 2021-03-22 MED ORDER — LOPERAMIDE HCL 2 MG PO CAPS
4.0000 mg | ORAL_CAPSULE | Freq: Three times a day (TID) | ORAL | Status: DC | PRN
  Administered 2021-03-22 – 2021-03-24 (×4): 4 mg via ORAL
  Filled 2021-03-22 (×4): qty 2

## 2021-03-22 MED ORDER — SACCHAROMYCES BOULARDII 250 MG PO CAPS
250.0000 mg | ORAL_CAPSULE | Freq: Two times a day (BID) | ORAL | Status: DC
  Administered 2021-03-22 – 2021-03-24 (×5): 250 mg via ORAL
  Filled 2021-03-22 (×6): qty 1

## 2021-03-22 MED ORDER — LOPERAMIDE HCL 2 MG PO CAPS
4.0000 mg | ORAL_CAPSULE | Freq: Once | ORAL | Status: AC
  Administered 2021-03-22: 4 mg via ORAL
  Filled 2021-03-22: qty 2

## 2021-03-22 NOTE — Progress Notes (Signed)
No diarrhea overnight.  Digoxin given last pm per cards order.  Heart rate as low 103 overnight.

## 2021-03-22 NOTE — Progress Notes (Signed)
Progress Note  Patient Name: Michele Green Date of Encounter: 03/22/2021  [facility] Cardiologist: Sinclair Grooms, MD   Subjective   Currently feeling weak.  The patient complains of diarrhea that is currently undergoing work-up.  Inpatient Medications    Scheduled Meds: . clonazePAM  0.5 mg Oral BID  . collagenase   Topical Daily  . digoxin  0.125 mg Oral Daily  . diltiazem  120 mg Oral Daily  . escitalopram  10 mg Oral Daily  . feeding supplement  1 Container Oral TID BM  . hydrOXYzine  25 mg Oral BID  . loperamide  4 mg Oral Once  . Mesalamine  400 mg Oral Daily  . metoprolol tartrate  25 mg Oral BID  . metroNIDAZOLE  500 mg Oral Q8H  . predniSONE  40 mg Oral Q breakfast  . Rivaroxaban  15 mg Oral QHS  . saccharomyces boulardii  250 mg Oral BID  . sodium chloride flush  3 mL Intravenous Q12H   Continuous Infusions: . sodium chloride 500 mL (03/19/21 1502)   PRN Meds: sodium chloride, acetaminophen **OR** acetaminophen, HYDROmorphone (DILAUDID) injection, loperamide, ondansetron **OR** ondansetron (ZOFRAN) IV, oxyCODONE   Vital Signs    Vitals:   03/21/21 1800 03/21/21 2034 03/22/21 0011 03/22/21 0519  BP: 101/71 107/71 101/77 113/76  Pulse:  (!) 136 (!) 135 (!) 135  Resp: 20 18 20 18   Temp:  (!) 97.5 F (36.4 C) 98.6 F (37 C) 98 F (36.7 C)  TempSrc:  Oral Oral Oral  SpO2: 95% 95% 97% 96%  Weight:    57 kg  Height:        Intake/Output Summary (Last 24 hours) at 03/22/2021 0913 Last data filed at 03/22/2021 0600 Gross per 24 hour  Intake 601 ml  Output 1250 ml  Net -649 ml   Last 3 Weights 03/22/2021 03/20/2021 03/19/2021  Weight (lbs) 125 lb 10.6 oz 127 lb 13.9 oz 117 lb 15.1 oz  Weight (kg) 57 kg 58 kg 53.5 kg      Telemetry    Atrial tachycardia- Personally Reviewed  ECG    Atrial flutter, rate 137- Personally Reviewed  Physical Exam   GEN: No acute distress.   Neck: No JVD Cardiac:  Tachycardic, no murmurs, rubs, or gallops.   Respiratory: Clear to auscultation bilaterally. GI: Soft, nontender, non-distended  MS: No edema; No deformity. Neuro:  Nonfocal  Psych: Normal affect   Labs    High Sensitivity Troponin:  No results for input(s): TROPONINIHS in the last 720 hours.    Chemistry Recent Labs  Lab 03/20/21 0342 03/21/21 0309 03/22/21 0123  NA 136 135 136  K 3.6 4.8 4.9  CL 105 104 104  CO2 26 26 27   GLUCOSE 95 118* 158*  BUN 20 14 14   CREATININE 0.63 0.61 0.58  CALCIUM 7.9* 7.8* 8.2*  GFRNONAA >60 >60 >60  ANIONGAP 5 5 5      Hematology Recent Labs  Lab 03/20/21 0342 03/21/21 0309 03/22/21 0123  WBC 13.1* 10.8* 10.8*  RBC 3.37* 3.14* 3.37*  HGB 10.2* 9.6* 10.2*  HCT 30.8* 29.0* 31.0*  MCV 91.4 92.4 92.0  MCH 30.3 30.6 30.3  MCHC 33.1 33.1 32.9  RDW 15.6* 15.9* 16.1*  PLT 161 153 172    BNPNo results for input(s): BNP, PROBNP in the last 168 hours.   DDimer No results for input(s): DDIMER in the last 168 hours.   Radiology    DG CHEST PORT 1  VIEW  Result Date: 03/20/2021 CLINICAL DATA:  80 year old female with shortness of breath.  MRSA. EXAM: PORTABLE CHEST 1 VIEW COMPARISON:  Portable chest 03/14/2021 and earlier. FINDINGS: Portable AP semi upright view at 0958 hours. Lower lung volumes with new bibasilar veiling opacity. No pneumothorax. Pulmonary vascularity within normal limits. Stable cardiac size and mediastinal contours. Postoperative changes to the heart and mediastinum. Chronic thoracic vertebral augmentation. Visualized tracheal air column is within normal limits. Stable visible bowel gas pattern. IMPRESSION: Lower lung volumes with evidence of bilateral pleural effusions, new or increased since 03/14/2021. No pulmonary edema. Electronically Signed   By: Genevie Ann M.D.   On: 03/20/2021 10:12    Cardiac Studies   TTE 03/15/21 1. Left ventricular ejection fraction, by estimation, is 60 to 65%. The  left ventricle has normal function. The left ventricle has no regional   wall motion abnormalities. Left ventricular diastolic parameters are  indeterminate.  2. Right ventricular systolic function is normal. The right ventricular  size is normal.  3. Left atrial size was severely dilated.  4. The mitral valve has been repaired/replaced. Mild mitral valve  regurgitation. Mild mitral stenosis. There is a prosthetic annuloplasty  ring present in the mitral position.  5. Tricuspid valve regurgitation is mild to moderate.  6. The aortic valve is tricuspid. Aortic valve regurgitation is not  visualized. No aortic stenosis is present.   Patient Profile     80 y.o. female with a history of hypertension, ulcerative colitis, permanent atrial fibrillation/flutter, coronary artery disease status post CABG, diastolic heart failure, breast cancer, hypertension, hyperlipidemia who presented with acute parotitis and MRSA bacteremia now with rapid heart rates.  Assessment & Plan    1.  Permanent atrial fibrillation/flutter: Patient appears to be in an atrial tachycardia today.  She is conducting one-to-one.  She does have episodes where she does slow with better controlled heart rates.  We Zalma Channing increase her diltiazem today.  I do think a rate control strategy would be most beneficial.  The patient does state that she hopes to go home with hospice.  If that is the plan, a more lenient rate control strategy could be adopted.  Would continue Xarelto.     For questions or updates, please contact [place] Please consult [email or web address] for contact info under        Signed, Raife Lizer Meredith Leeds, MD  03/22/2021, 9:13 AM

## 2021-03-22 NOTE — Progress Notes (Signed)
[name] [id number] Hospice Patient [facility])  [facility]. Facility notified [facility] on callstaffthat patient was experiencing swelling on therightside of herneck withassociatedpain. After assessment and discussing with patient's son, the decision was made to have EMS transport her to ED for evaluation. Patient was admitted to [facility] on 4/23 with a diagnosis ofsepsis due to acute parotiditis and MRSA bacteremia.Per [facility] physician Dr. Gilford Rile, this is a related admission.  Visited pt at bedside.  Pt AOx2, reports feeling "better,"  is looking over the menu for dinner.  CNA Annette at bedsdie shares that pt not eating, only drinking Boost, reports one episode of diarrhea today.  Pt reports food has lost some of its taste, expresses interest in a fruit plate. HR noted to be <80, resp even/unlabored.  RN unavailable to exchange report.  Son [name] updated by phone, shares concern that there is a Covid outbreak at [facility].   Pt is appropraite for contued GIP status need to monitor effectiveness of new cardiac and symptom management regime.  V/S: 98.0 oral, 103/75, HR 78, RR 18, SPO2 96% on RA I&O: 601/1250 Labs: Ca 8.2, WBC 10.8, hgb 10.2 Diagnostics: none nnew today IVs/PRNs: NS flush BID to maintain IV access, PRN imodium  Problem List: - sepsis due to acute parodititis and MRSA bacteremia - As per ID sign off note 4/27, 4 weeks of linezolid to be started 10 days after dose of oritavancin, for empirically treating endocarditis if repeat blood cultures are negative in 48 hours and, continue metronidazole x10 days, monitor CBC at least every 2 weeks while on linezolid - a. Fib/a. Flutter - was on diltiazem drip > cards increased cardizem from 120 mg to 180 mg daily, HR at time of visit 78 -- diarrhea - patient reports one episode today, c-diff PCR  was negative, registered dietician changed glucerna to boost, imodium dose increased from 2 mg to 4 mg  GOC: clear, focus on comfort but treat what can be treated D/C planning: return to ALF. Son contacted ALF to discuss increasing her level of assistance to a 6/6, he advised that [facility] can take her back once she is medically optimized and for the hospital TOC to assist once ready. She will be prescribed linezolid for 4 weeks and need a CBC every two weeks. On 5/1 [name] shared concern that there is a Covid outbrek at [employer] IDT: hospice team updated Family: updated Designer, industrial/product by phone  [name], BSN, [employer] [phone]

## 2021-03-22 NOTE — Progress Notes (Signed)
PROGRESS NOTE   Michele Green  BWL:[id number]    DOB: Nov 02, 1941    DOA: 03/14/2021  PCP: [name], MD    Brief Narrative:  80 year old female, on home hospice due to end-stage hypertensive heart disease, with medical history significant for but not limited to ulcerative colitis, chronic loose stools, paroxysmal atrial fibrillation on Xarelto and digoxin, s/p maze procedure, CAD s/p CABG, chronic diastolic CHF, Addison's disease, breast cancer, COVID 19 infection in January 2022, hypertension, hyperlipidemia, presented to the ED with right lateral neck and face pain.  Admitted for sepsis due to acute parotitis and MRSA bacteremia.  Course complicated by diarrhea and persistent sinus tachycardia.  Diarrhea improved.  Tachycardia intermittent.   Assessment & Plan:   Sepsis, POA due to right acute parotitis and MRSA bacteremia: Infectious disease assisted with management and signed off 4/27.  TTE with no vegetation.  Antibiotics (Unasyn 4/23-4/24, cefepime 4/23, metronidazole 4/24 >, vancomycin 4/23 and 4/25, oritavancin 4/27).   Currently on metronidazole.  To be continued for a total of 10 days, last date will be May 4. Blood cultures x2 from 4/25: Negative to date.   As per ID sign off note 4/27, 4 weeks of linezolid to be started 10 days after dose of oritavancin, for empirically treating endocarditis if repeat blood cultures are negative in 48 hours. Repeat blood cultures have been negative so far.  Linezolid to be initiated on May 7 for total of 4 weeks. Monitor CBC at least every 2 weeks while on linezolid.  No changes/stable.  Chronic atrial flutter Patient has chronic atrial flutter.  Has been on metoprolol and diltiazem along with digoxin.  She was on rivaroxaban for anticoagulation.   Noted to have tachycardia on 4/30.  Cardiology was consulted.  Dose adjustments have been made to her diltiazem this morning.  Since patient plans to go home with hospice no aggressive  interventions will be attempted.   Potassium 4.9 today.  Magnesium 2.3 yesterday. Discussed with Dr. Curt Bears this morning.  Relative adrenal insufficiency: Patient was on Solu-Medrol 50 mg IV every 6 hours which was tapered and eventually switched to prednisone 40 mg daily on 4/29 x 5 days.  Not on steroids PTA.  Hypomagnesemia Replaced.  Hypokalemia Replaced.  Leg wounds, POA Continue local wound care  Diarrhea/history of ulcerative colitis Followed by [facility].  Communicated with [facility].  C. difficile and GI panel PCR negative.  Imodium as needed.  Changed dietary supplements from Glucerna to boost.  Essential hypertension Stable.  Anemia of chronic disease Stable.  Anxiety Continue Lexapro, hydroxyzine and Klonopin.  History of breast cancer No longer on tamoxifen.  Failure to thrive/goals of care: As per home hospice team, focus on comfort but treat what can be treated with plans to return to ALF.   Body mass index is 22.26 kg/m.  Nutritional Status Nutrition Problem: Increased nutrient needs Etiology: acute illness (MRSA bacteremia) Signs/Symptoms: estimated needs Interventions: Ensure Enlive (each supplement provides 350kcal and 20 grams of protein)   DVT prophylaxis:   On rivaroxaban Code Status: DNR Family Communication: ALF will not be able to accept patient with her higher level of needs until Monday 5/2. Disposition: Hopefully back to ALF tomorrow.  Status is: Inpatient  Remains inpatient appropriate because:Inpatient level of care appropriate due to severity of illness   Dispo:  Patient From: Home  Planned Disposition: [facility]  Medically stable for discharge: No          Consultants:  Infectious disease Cardiology  Procedures:   None  Antimicrobials:    Anti-infectives (From admission, onward)   Start     Dose/Rate Route Frequency Ordered Stop   03/18/21 1000  Oritavancin Diphosphate (ORBACTIV) 1,200 mg in  dextrose 5 % IVPB        1,200 mg 333.3 mL/hr over 180 Minutes Intravenous Once 03/18/21 0841 03/18/21 1335   03/17/21 2000  DAPTOmycin (CUBICIN) 400 mg in sodium chloride 0.9 % IVPB  Status:  Discontinued        8 mg/kg  49.3 kg 116 mL/hr over 30 Minutes Intravenous Daily 03/16/21 1023 03/18/21 0841   03/16/21 0800  vancomycin (VANCOREADY) IVPB 1000 mg/200 mL  Status:  Discontinued        1,000 mg 200 mL/hr over 60 Minutes Intravenous Every 48 hours 03/15/21 0538 03/16/21 1023   03/16/21 0600  vancomycin (VANCOREADY) IVPB 500 mg/100 mL  Status:  Discontinued        500 mg 100 mL/hr over 60 Minutes Intravenous Every 48 hours 03/14/21 0757 03/14/21 1657   03/15/21 1400  metroNIDAZOLE (FLAGYL) tablet 500 mg        500 mg Oral Every 8 hours 03/15/21 1119     03/15/21 0800  ceFEPIme (MAXIPIME) 2 g in sodium chloride 0.9 % 100 mL IVPB  Status:  Discontinued        2 g 200 mL/hr over 30 Minutes Intravenous Every 24 hours 03/14/21 0757 03/14/21 1657   03/14/21 1800  Ampicillin-Sulbactam (UNASYN) 3 g in sodium chloride 0.9 % 100 mL IVPB  Status:  Discontinued        3 g 200 mL/hr over 30 Minutes Intravenous Every 8 hours 03/14/21 1706 03/15/21 1117   03/14/21 1745  Ampicillin-Sulbactam (UNASYN) 3 g in sodium chloride 0.9 % 100 mL IVPB  Status:  Discontinued        3 g 200 mL/hr over 30 Minutes Intravenous Every 6 hours 03/14/21 1657 03/14/21 1705   03/14/21 0645  ceFEPIme (MAXIPIME) 2 g in sodium chloride 0.9 % 100 mL IVPB        2 g 200 mL/hr over 30 Minutes Intravenous  Once 03/14/21 0632 03/14/21 1512   03/14/21 0645  metroNIDAZOLE (FLAGYL) IVPB 500 mg        500 mg 100 mL/hr over 60 Minutes Intravenous  Once 03/14/21 0632 03/14/21 1512   03/14/21 0645  vancomycin (VANCOCIN) IVPB 1000 mg/200 mL premix        1,000 mg 200 mL/hr over 60 Minutes Intravenous  Once 03/14/21 4665 03/14/21 1512        Subjective:  Patient denies any chest pain.  Has had some palpitations.  Occasional  shortness of breath but none currently.  Some nausea but no vomiting.  No abdominal pain.    Objective:   Vitals:   03/22/21 0011 03/22/21 0519 03/22/21 0919 03/22/21 1016  BP: 101/77 113/76 (!) 100/59 103/75  Pulse: (!) 135 (!) 135 (!) 141 (!) 136  Resp: 20 18    Temp: 98.6 F (37 C) 98 F (36.7 C)    TempSrc: Oral Oral    SpO2: 97% 96% 97% 97%  Weight:  57 kg    Height:        General appearance: Awake alert.  In no distress No significant swelling noted over the right parotid area Resp: Mildly tachypneic with few crackles at the bases.  No wheezing or rhonchi. Cardio: S1-S2 is tachycardic regular.  No S3-S4.  No rubs murmurs  or bruit GI: Abdomen is soft.  Nontender nondistended.  Bowel sounds are present normal.  No masses organomegaly Extremities: No edema.   Neurologic:   No focal neurological deficits.       Data Reviewed:   I have personally reviewed following labs and imaging studies   CBC: Recent Labs  Lab 03/20/21 0342 03/21/21 0309 03/22/21 0123  WBC 13.1* 10.8* 10.8*  HGB 10.2* 9.6* 10.2*  HCT 30.8* 29.0* 31.0*  MCV 91.4 92.4 92.0  PLT 161 153 546    Basic Metabolic Panel: Recent Labs  Lab 03/19/21 0241 03/20/21 0342 03/20/21 1557 03/21/21 0309 03/21/21 0602 03/22/21 0123  NA 135 136  --  135  --  136  K 3.4* 3.6  --  4.8  --  4.9  CL 103 105  --  104  --  104  CO2 23 26  --  26  --  27  GLUCOSE 124* 95  --  118*  --  158*  BUN 21 20  --  14  --  14  CREATININE 0.72 0.63  --  0.61  --  0.58  CALCIUM 8.1* 7.9*  --  7.8*  --  8.2*  MG 1.5*  --  1.7  --  2.3  --      Microbiology Studies:   Recent Results (from the past 240 hour(s))  Resp Panel by RT-PCR (Flu A&B, Covid) Nasopharyngeal Swab     Status: None   Collection Time: 03/14/21  6:31 AM   Specimen: Nasopharyngeal Swab; Nasopharyngeal(NP) swabs in vial transport medium  Result Value Ref Range Status   SARS Coronavirus 2 by RT PCR NEGATIVE NEGATIVE Final    Comment:  (NOTE) SARS-CoV-2 target nucleic acids are NOT DETECTED.  The SARS-CoV-2 RNA is generally detectable in upper respiratory specimens during the acute phase of infection. The lowest concentration of SARS-CoV-2 viral copies this assay can detect is 138 copies/mL. A negative result does not preclude SARS-Cov-2 infection and should not be used as the sole basis for treatment or other patient management decisions. A negative result may occur with  improper specimen collection/handling, submission of specimen other than nasopharyngeal swab, presence of viral mutation(s) within the areas targeted by this assay, and inadequate number of viral copies(<138 copies/mL). A negative result must be combined with clinical observations, patient history, and epidemiological information. The expected result is Negative.  Fact Sheet for Patients:  EntrepreneurPulse.com.au  Fact Sheet for Healthcare Providers:  IncredibleEmployment.be  This test is no t yet approved or cleared by the [place] FDA and  has been authorized for detection and/or diagnosis of SARS-CoV-2 by FDA under an Emergency Use Authorization (EUA). This EUA will remain  in effect (meaning this test can be used) for the duration of the COVID-19 declaration under Section 564(b)(1) of the Act, 21 U.S.C.section 360bbb-3(b)(1), unless the authorization is terminated  or revoked sooner.       Influenza A by PCR NEGATIVE NEGATIVE Final   Influenza B by PCR NEGATIVE NEGATIVE Final    Comment: (NOTE) The Xpert Xpress SARS-CoV-2/FLU/RSV plus assay is intended as an aid in the diagnosis of influenza from Nasopharyngeal swab specimens and should not be used as a sole basis for treatment. Nasal washings and aspirates are unacceptable for Xpert Xpress SARS-CoV-2/FLU/RSV testing.  Fact Sheet for Patients: EntrepreneurPulse.com.au  Fact Sheet for Healthcare  Providers: IncredibleEmployment.be  This test is not yet approved or cleared by the [place] FDA and has been authorized for detection and/or  diagnosis of SARS-CoV-2 by FDA under an Emergency Use Authorization (EUA). This EUA will remain in effect (meaning this test can be used) for the duration of the COVID-19 declaration under Section 564(b)(1) of the Act, 21 U.S.C. section 360bbb-3(b)(1), unless the authorization is terminated or revoked.  Performed at [facility]   Blood Culture (routine x 2)     Status: Abnormal   Collection Time: 03/14/21  6:31 AM   Specimen: BLOOD  Result Value Ref Range Status   Specimen Description   Final    BLOOD BLOOD LEFT FOREARM Performed at [facility]    Special Requests   Final    BOTTLES DRAWN AEROBIC AND ANAEROBIC Blood Culture adequate volume Performed at [facility]    Culture  Setup Time   Final    GRAM POSITIVE COCCI ANAEROBIC BOTTLE ONLY CRITICAL VALUE NOTED.  VALUE IS CONSISTENT WITH PREVIOUSLY REPORTED AND CALLED VALUE.    Culture (A)  Final    STAPHYLOCOCCUS AUREUS SUSCEPTIBILITIES PERFORMED ON PREVIOUS CULTURE WITHIN THE LAST 5 DAYS. Performed at [facility], [place] [address] [place], [place] [address]    Report Status 03/17/2021 FINAL  Final  Group A Strep by PCR     Status: None   Collection Time: 03/14/21  6:31 AM   Specimen: Peripheral; Sterile Swab  Result Value Ref Range Status   Group A Strep by PCR NOT DETECTED NOT DETECTED Final    Comment: Performed at [facility]  Blood Culture (routine x 2)     Status: Abnormal   Collection Time: 03/14/21  6:36 AM   Specimen: BLOOD  Result Value Ref Range Status   Specimen Description   Final    BLOOD BLOOD LEFT WRIST Performed at [facility]    Special Requests   Final    BOTTLES DRAWN AEROBIC AND ANAEROBIC Blood Culture adequate volume Performed at [facility]    Culture  Setup Time   Final    GRAM POSITIVE COCCI AEROBIC BOTTLE ONLY CRITICAL RESULT CALLED TO, READ BACK BY AND VERIFIED WITH: Beulah Gandy 03/15/2021 at [place].Ysidro Evert Performed at [facility], [place] [address], [place], [place] [address]    Culture METHICILLIN RESISTANT STAPHYLOCOCCUS AUREUS (A)  Final   Report Status 03/17/2021 FINAL  Final   Organism ID, Bacteria METHICILLIN RESISTANT STAPHYLOCOCCUS AUREUS  Final      Susceptibility   Methicillin resistant staphylococcus aureus - MIC*    CIPROFLOXACIN >=8 RESISTANT Resistant     ERYTHROMYCIN >=8 RESISTANT Resistant     GENTAMICIN <=0.5 SENSITIVE Sensitive     OXACILLIN >=4 RESISTANT Resistant     TETRACYCLINE <=1 SENSITIVE Sensitive     VANCOMYCIN <=0.5 SENSITIVE Sensitive     TRIMETH/SULFA >=320 RESISTANT Resistant     CLINDAMYCIN <=0.25 SENSITIVE Sensitive     RIFAMPIN <=0.5 SENSITIVE Sensitive     Inducible Clindamycin NEGATIVE Sensitive     * METHICILLIN RESISTANT STAPHYLOCOCCUS AUREUS  Blood Culture ID Panel (Reflexed)     Status: Abnormal   Collection Time: 03/14/21  6:36 AM  Result Value Ref Range Status   Enterococcus faecalis NOT DETECTED NOT DETECTED Final   Enterococcus Faecium NOT DETECTED NOT DETECTED Final   Listeria monocytogenes NOT DETECTED NOT DETECTED Final   Staphylococcus species DETECTED (A) NOT DETECTED Final    Comment: CRITICAL RESULT CALLED TO, READ BACK BY AND VERIFIED WITH: PHARMD JAMES LEDFORD 03/15/2021 AT  0503 A.HUGHES    Staphylococcus aureus (BCID) DETECTED (A) NOT DETECTED Final    Comment: Methicillin (oxacillin)-resistant Staphylococcus aureus (MRSA). MRSA is predictably resistant to beta-lactam antibiotics (except ceftaroline). Preferred therapy is vancomycin unless clinically contraindicated. Patient requires contact precautions if  hospitalized. CRITICAL RESULT CALLED TO, READ BACK BY AND VERIFIED WITH: PHARMD JAMES LEDFORD 03/15/2021 AT 0503  A.HUGHES    Staphylococcus epidermidis NOT DETECTED NOT DETECTED Final   Staphylococcus lugdunensis NOT DETECTED NOT DETECTED Final   Streptococcus species NOT DETECTED NOT DETECTED Final   Streptococcus agalactiae NOT DETECTED NOT DETECTED Final   Streptococcus pneumoniae NOT DETECTED NOT DETECTED Final   Streptococcus pyogenes NOT DETECTED NOT DETECTED Final   A.calcoaceticus-baumannii NOT DETECTED NOT DETECTED Final   Bacteroides fragilis NOT DETECTED NOT DETECTED Final   Enterobacterales NOT DETECTED NOT DETECTED Final   Enterobacter cloacae complex NOT DETECTED NOT DETECTED Final   Escherichia coli NOT DETECTED NOT DETECTED Final   Klebsiella aerogenes NOT DETECTED NOT DETECTED Final   Klebsiella oxytoca NOT DETECTED NOT DETECTED Final   Klebsiella pneumoniae NOT DETECTED NOT DETECTED Final   Proteus species NOT DETECTED NOT DETECTED Final   Salmonella species NOT DETECTED NOT DETECTED Final   Serratia marcescens NOT DETECTED NOT DETECTED Final   Haemophilus influenzae NOT DETECTED NOT DETECTED Final   Neisseria meningitidis NOT DETECTED NOT DETECTED Final   Pseudomonas aeruginosa NOT DETECTED NOT DETECTED Final   Stenotrophomonas maltophilia NOT DETECTED NOT DETECTED Final   Candida albicans NOT DETECTED NOT DETECTED Final   Candida auris NOT DETECTED NOT DETECTED Final   Candida glabrata NOT DETECTED NOT DETECTED Final   Candida krusei NOT DETECTED NOT DETECTED Final   Candida parapsilosis NOT DETECTED NOT DETECTED Final   Candida tropicalis NOT DETECTED NOT DETECTED Final   Cryptococcus neoformans/gattii NOT DETECTED NOT DETECTED Final   Meth resistant mecA/C and MREJ DETECTED (A) NOT DETECTED Final    Comment: CRITICAL RESULT CALLED TO, READ BACK BY AND VERIFIED WITH: PHARMD JAMES LEDFORD 03/15/2021 AT 0503 A.HUGHES Performed at [facility], [place] [address], [place], [place] [address]   Urine culture     Status: None   Collection Time: 03/14/21 10:44 AM   Specimen:  In/Out Cath Urine  Result Value Ref Range Status   Specimen Description   Final    IN/OUT CATH URINE Performed at [facility]    Special Requests NONE Performed at [facility]   Final   Culture   Final    NO GROWTH Performed at [facility], [place] [address], [place], [place] [address]    Report Status 03/15/2021 FINAL  Final  MRSA PCR Screening     Status: Abnormal   Collection Time: 03/14/21  6:00 PM   Specimen: Nasopharyngeal  Result Value Ref Range Status   MRSA by PCR POSITIVE (A) NEGATIVE Final    Comment:        The GeneXpert MRSA Assay (FDA approved for NASAL specimens only), is one component of a comprehensive MRSA colonization surveillance program. It is not intended to diagnose MRSA infection nor to guide or monitor treatment for MRSA infections. RESULT CALLED TO, READ BACK BY AND VERIFIED WITH: K,HOLDEN RN @2022  03/14/21 EB Performed at [facility] [address], [place], [place] [address]   Respiratory (~20 pathogens) panel by PCR     Status: None   Collection Time: 03/14/21  6:30 PM  Result Value Ref Range Status  Adenovirus NOT DETECTED NOT DETECTED Final   Coronavirus 229E NOT DETECTED NOT DETECTED Final    Comment: (NOTE) The Coronavirus on the Respiratory Panel, DOES NOT test for the novel  Coronavirus (2019 nCoV)    Coronavirus HKU1 NOT DETECTED NOT DETECTED Final   Coronavirus NL63 NOT DETECTED NOT DETECTED Final   Coronavirus OC43 NOT DETECTED NOT DETECTED Final   Metapneumovirus NOT DETECTED NOT DETECTED Final   Rhinovirus / Enterovirus NOT DETECTED NOT DETECTED Final   Influenza A NOT DETECTED NOT DETECTED Final   Influenza B NOT DETECTED NOT DETECTED Final   Parainfluenza Virus 1 NOT DETECTED NOT DETECTED Final   Parainfluenza Virus 2 NOT DETECTED NOT DETECTED Final   Parainfluenza Virus 3 NOT DETECTED NOT DETECTED Final   Parainfluenza Virus 4 NOT DETECTED NOT DETECTED Final   Respiratory  Syncytial Virus NOT DETECTED NOT DETECTED Final   Bordetella pertussis NOT DETECTED NOT DETECTED Final   Bordetella Parapertussis NOT DETECTED NOT DETECTED Final   Chlamydophila pneumoniae NOT DETECTED NOT DETECTED Final   Mycoplasma pneumoniae NOT DETECTED NOT DETECTED Final    Comment: Performed at [facility], [place] [address], [place], [place] [address]  C Difficile Quick Screen w PCR reflex     Status: None   Collection Time: 03/15/21  6:00 PM   Specimen: STOOL  Result Value Ref Range Status   C Diff antigen NEGATIVE NEGATIVE Final   C Diff toxin NEGATIVE NEGATIVE Final   C Diff interpretation No C. difficile detected.  Final    Comment: Performed at [facility], [place] [address], [place], [place] [address]  Culture, blood (routine x 2)     Status: None   Collection Time: 03/16/21  9:28 AM   Specimen: BLOOD  Result Value Ref Range Status   Specimen Description BLOOD RIGHT ANTECUBITAL  Final   Special Requests   Final    BOTTLES DRAWN AEROBIC AND ANAEROBIC Blood Culture adequate volume   Culture   Final    NO GROWTH 5 DAYS Performed at [facility], [place] [address] [place], [place] [address]    Report Status 03/21/2021 FINAL  Final  Culture, blood (routine x 2)     Status: None   Collection Time: 03/16/21  9:28 AM   Specimen: BLOOD  Result Value Ref Range Status   Specimen Description BLOOD BLOOD RIGHT HAND  Final   Special Requests   Final    BOTTLES DRAWN AEROBIC AND ANAEROBIC Blood Culture results may not be optimal due to an inadequate volume of blood received in culture bottles   Culture   Final    NO GROWTH 5 DAYS Performed at [facility], [place] [address] [place], [place] [address]    Report Status 03/21/2021 FINAL  Final  Culture, blood (routine x 2)     Status: None (Preliminary result)   Collection Time: 03/17/21  3:12 AM   Specimen: BLOOD LEFT HAND  Result Value Ref Range Status   Specimen Description BLOOD LEFT HAND  Final   Special  Requests   Final    BOTTLES DRAWN AEROBIC ONLY Blood Culture adequate volume   Culture   Final    NO GROWTH 4 DAYS Performed at [facility], [place] [address], [place], [place] [address]    Report Status PENDING  Incomplete  Culture, blood (routine x 2)     Status: None (Preliminary result)   Collection Time: 03/17/21  3:13 AM  Specimen: BLOOD  Result Value Ref Range Status   Specimen Description BLOOD SITE NOT SPECIFIED  Final   Special Requests   Final    BOTTLES DRAWN AEROBIC ONLY Blood Culture adequate volume   Culture   Final    NO GROWTH 4 DAYS Performed at [facility], [address] [place], [place] [address]    Report Status PENDING  Incomplete  Gastrointestinal Panel by PCR , Stool     Status: None   Collection Time: 03/19/21 10:03 AM   Specimen: Stool  Result Value Ref Range Status   Campylobacter species NOT DETECTED NOT DETECTED Final   Plesimonas shigelloides NOT DETECTED NOT DETECTED Final   Salmonella species NOT DETECTED NOT DETECTED Final   Yersinia enterocolitica NOT DETECTED NOT DETECTED Final   Vibrio species NOT DETECTED NOT DETECTED Final   Vibrio cholerae NOT DETECTED NOT DETECTED Final   Enteroaggregative E coli (EAEC) NOT DETECTED NOT DETECTED Final   Enteropathogenic E coli (EPEC) NOT DETECTED NOT DETECTED Final   Enterotoxigenic E coli (ETEC) NOT DETECTED NOT DETECTED Final   Shiga like toxin producing E coli (STEC) NOT DETECTED NOT DETECTED Final   Shigella/Enteroinvasive E coli (EIEC) NOT DETECTED NOT DETECTED Final   Cryptosporidium NOT DETECTED NOT DETECTED Final   Cyclospora cayetanensis NOT DETECTED NOT DETECTED Final   Entamoeba histolytica NOT DETECTED NOT DETECTED Final   Giardia lamblia NOT DETECTED NOT DETECTED Final   Adenovirus F40/41 NOT DETECTED NOT DETECTED Final   Astrovirus NOT DETECTED NOT DETECTED Final   Norovirus GI/GII NOT DETECTED NOT DETECTED Final   Rotavirus A NOT DETECTED NOT DETECTED Final   Sapovirus (I,  II, IV, and V) NOT DETECTED NOT DETECTED Final    Comment: Performed at [facility], [address] ,  [address]     Radiology Studies:  No results found.   Scheduled Meds:   . clonazePAM  0.5 mg Oral BID  . collagenase   Topical Daily  . digoxin  0.125 mg Oral Daily  . diltiazem  180 mg Oral Daily  . escitalopram  10 mg Oral Daily  . feeding supplement  1 Container Oral TID BM  . hydrOXYzine  25 mg Oral BID  . Mesalamine  400 mg Oral Daily  . metoprolol tartrate  25 mg Oral BID  . metroNIDAZOLE  500 mg Oral Q8H  . predniSONE  40 mg Oral Q breakfast  . Rivaroxaban  15 mg Oral QHS  . saccharomyces boulardii  250 mg Oral BID  . sodium chloride flush  3 mL Intravenous Q12H    Continuous Infusions:   . sodium chloride 500 mL (03/19/21 1502)     LOS: 8 days     [facility]    To contact the attending provider between 7A-7P or the covering provider during after hours 7P-7A, please log into the web site [email or web address] and access using universal [place] password for that web site. If you do not have the password, please call the hospital operator.  03/22/2021, 10:48 AM

## 2021-03-23 DIAGNOSIS — I10 Essential (primary) hypertension: Secondary | ICD-10-CM

## 2021-03-23 DIAGNOSIS — I4892 Unspecified atrial flutter: Secondary | ICD-10-CM

## 2021-03-23 DIAGNOSIS — A4102 Sepsis due to Methicillin resistant Staphylococcus aureus: Principal | ICD-10-CM

## 2021-03-23 DIAGNOSIS — R6520 Severe sepsis without septic shock: Secondary | ICD-10-CM

## 2021-03-23 LAB — BASIC METABOLIC PANEL
Anion gap: 4 — ABNORMAL LOW (ref 5–15)
BUN: 18 mg/dL (ref 8–23)
CO2: 29 mmol/L (ref 22–32)
Calcium: 8.2 mg/dL — ABNORMAL LOW (ref 8.9–10.3)
Chloride: 103 mmol/L (ref 98–111)
Creatinine, Ser: 0.6 mg/dL (ref 0.44–1.00)
GFR, Estimated: >60 mL/min (ref >60)
Glucose, Bld: 129 mg/dL — ABNORMAL HIGH (ref 70–99)
Potassium: 4.7 mmol/L (ref 3.5–5.1)
Sodium: 136 mmol/L (ref 135–145)

## 2021-03-23 LAB — MAGNESIUM: Magnesium: 1.8 mg/dL (ref 1.7–2.4)

## 2021-03-23 MED ORDER — MAGNESIUM SULFATE 2 GM/50ML IV SOLN
2.0000 g | Freq: Once | INTRAVENOUS | Status: AC
  Administered 2021-03-23: 2 g via INTRAVENOUS
  Filled 2021-03-23: qty 50

## 2021-03-23 MED ORDER — HYDROCORTISONE 1 % EX CREA
TOPICAL_CREAM | CUTANEOUS | Status: DC | PRN
  Administered 2021-03-23: 1 via TOPICAL
  Filled 2021-03-23: qty 28

## 2021-03-23 NOTE — Evaluation (Signed)
Physical Therapy Evaluation & DIscharge Patient Details Name: Michele Green MRN: [id number] DOB: 16-Oct-1941 Today's Date: 03/23/2021   History of Present Illness  Pt is an 80 y.o. female admitted from [facility] on 03/14/21 with sepsis due to acute parodititis and MRSA bacteremia, afib/aflutter, hypotension. 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    Clinical Impression  Patient evaluated by Physical Therapy with no further acute PT needs identified. PTA, pt resides at [facility]; pt reports dependent for mobility with lift assist, dependent for bed-level ADL tasks. Today, pt requiring modA for rolling in bed, maxA for repositioning; limited by fatigue and diffuse weakness; pt declined OOB attempts. All education has been completed and the patient has no further questions. Acute PT is signing off. Thank you for this referral.    Follow Up Recommendations SNF;Supervision for mobility/OOB (return to ALF)    Equipment Recommendations  None recommended by PT    Recommendations for Other Services       Precautions / Restrictions Precautions Precautions: Fall;Other (comment) Precaution Comments: Hypotension, R heel ulcer, bladder/bowel incontinence Restrictions Weight Bearing Restrictions: No      Mobility  Bed Mobility Overal bed mobility: Needs Assistance Bed Mobility: Rolling Rolling: Mod assist         General bed mobility comments: Pt reluctant to roll, able to roll towards L-side with modA for pelvic rotation, once able to reach rail could assist minimally with UE support. Pt declined attempt to sit EOB    Transfers                 General transfer comment: Deferred. Will require +2 assist and/or maximove lift  Ambulation/Gait                Stairs            Wheelchair Mobility    Modified Rankin (Stroke Patients Only)        Balance Overall balance assessment: Needs assistance     Sitting balance - Comments: Requires maxA to come to partial long sitting from elevated bed height in order to spit while brushing teeth                                     Pertinent Vitals/Pain Pain Assessment: Faces Faces Pain Scale: Hurts a little bit Pain Location: Generalized Pain Descriptors / Indicators: Discomfort;Moaning Pain Intervention(s): Monitored during session;Limited activity within patient's tolerance    Home Living Family/patient expects to be discharged to:: Assisted living               Home Equipment: Walker - 2 wheels;Walker - 4 wheels;Wheelchair - manual;Shower seat;Grab bars - toilet Additional Comments: From [facility], dependent on staff for care    Prior Function Level of Independence: Needs assistance   Gait / Transfers Assistance Needed: Pt reports dependent with bed-level mobility at ALF; reports staff physically lifts her OOB (does not use hoyer lift). When asked about typical day, pt reports she prefers to stay in bed and rest  ADL's / Homemaking Assistance Needed: Assist from staff for all ADLs (at bed-level); wears Depends  Comments: Prior to 11/2020, pt was ambulatory with rollator     Hand Dominance   Dominant Hand: Right    Extremity/Trunk Assessment   Upper Extremity Assessment Upper Extremity Assessment: Generalized weakness  Lower Extremity Assessment Lower Extremity Assessment: RLE deficits/detail;LLE deficits/detail RLE Deficits / Details: R ankle DF <3/5 (heel ulcer); hip flex <3/5, knee ext 3/5 RLE Coordination: decreased gross motor;decreased fine motor LLE Deficits / Details: [place] DF 3/5, flex <3/5, knee ext 3/5 LLE Coordination: decreased gross motor;decreased fine motor    Cervical / Trunk Assessment Cervical / Trunk Assessment:  (weak neck/trunk)  Communication   Communication: No difficulties  Cognition Arousal/Alertness:  Awake/alert Behavior During Therapy: Flat affect Overall Cognitive Status: Within Functional Limits for tasks assessed                                 General Comments: WFL for simple tasks, not formally assessed      General Comments General comments (skin integrity, edema, etc.): 109/66, HR 62, 97% on RA    Exercises     Assessment/Plan    PT Assessment All further PT needs can be met in the next venue of care  PT Problem List Decreased strength;Decreased activity tolerance;Decreased balance;Decreased mobility;Pain;Cardiopulmonary status limiting activity;Decreased skin integrity       PT Treatment Interventions      PT Goals (Current goals can be found in the Care Plan section)  Acute Rehab PT Goals Patient Stated Goal: "I just want to rest" PT Goal Formulation: All assessment and education complete, DC therapy    Frequency     Barriers to discharge        Co-evaluation               AM-PAC PT "6 Clicks" Mobility  Outcome Measure Help needed turning from your back to your side while in a flat bed without using bedrails?: A Lot Help needed moving from lying on your back to sitting on the side of a flat bed without using bedrails?: A Lot Help needed moving to and from a bed to a chair (including a wheelchair)?: Total Help needed standing up from a chair using your arms (e.g., wheelchair or bedside chair)?: Total Help needed to walk in hospital room?: Total Help needed climbing 3-5 steps with a railing? : Total 6 Click Score: 8    End of Session   Activity Tolerance: Patient limited by fatigue;Patient limited by pain Patient left: in bed;with call bell/phone within reach;with bed alarm set Nurse Communication: Mobility status PT Visit Diagnosis: Other abnormalities of gait and mobility (R26.89);Muscle weakness (generalized) (M62.81)    Time: 0017-4944 PT Time Calculation (min) (ACUTE ONLY): 20 min   Charges:   PT Evaluation $PT Eval  Moderate Complexity: College, PT, DPT Acute Rehabilitation Services  Pager [phone] Office [place] 03/23/2021, 9:08 AM

## 2021-03-23 NOTE — Progress Notes (Signed)
[facility]  PROGRESS NOTE  Michele Green KPT:[id number] DOB: Sep 16, 1941 DOA: 03/14/2021 PCP: [name], MD   Brief HPI:   80 year old female with history of ulcerative colitis, chronic loose stools, paroxysmal atrial fibrillation on Xarelto and digoxin s/p maze procedure, CAD s/p CABG, chronic diastolic CHF, Addison's disease, breast cancer, COVID infection in January 2022, hypertension, hyperlipidemia presented to ED with neck pain.  Patient developed right lateral neck and face pain and presented to [facility], [place], found to have SIRS criteria.  Code sepsis was activated.  Patient is on home hospice due to end-stage hypertensive heart disease.  CT soft tissue neck with contrast showed acute parotitis.  No abscess or obstructing process.  Secondary inflammation within the right neck and pharynx.  Patient continued to have sinus tachycardia and diarrhea.  She has history of ulcerative colitis.  C. difficile PCR and GI pathogen panel were negative.  Cardiology was consulted for persistent tachycardia, dose of digoxin was increased.  Heart rate has improved.  Diarrhea has improved after starting Imodium.    Subjective   Patient seen and examined, denies chest pain or shortness of breath.  Diarrhea has improved with Imodium.   Assessment/Plan:     1. Sepsis due to acute parotitis/MRSA bacteremia-patient presented with leukocytosis, WBC 30,000, fever tachycardia and tachypnea.  Initially started on Unasyn and was switched to IV daptomycin and p.o. Flagyl on 03/15/2021 due to MRSA bacteremia.  2D echo showed no endocarditis.  Repeat blood cultures as of 4/25 are negative to date.  ID was consulted.  As blood cultures were negative she was given 1 dose of oritavancin yesterday this is to be followed by 4 weeks of linezolid to be started 10 days after dose of oritavancin for empirically treating endocarditis.  Continue metronidazole for total 10 days.  Monitor CBC for at least every 2 weeks  while being on the linezolid. 2. MRSA bacteremia-likely from above.  Patient on antibiotics as above.  Antibiotics as above.   Plan for total 6 weeks of antibiotics.  WBC is 10.8 today. 3. Chronic atrial flutter-patient has history of chronic atrial flutter, has been on metoprolol and Cardizem along with digoxin.  She was on rivaroxaban for anticoagulation.  She was noted to have sinus tachycardia, which was persistent so cardiology was consulted.  Dose of digoxin increased per cardiology.  Heart rate is controlled currently on digoxin 0.125 mg daily, Cardizem 180 mg daily, metoprolol 25 mg p.o. twice daily. 4. History of relative adrenal insufficiency-patient was started on Solu-Cortef 50 mg IV every 6 hours as stress dose steroids on 03/14/2021.  Dose of Solu-Cortef was changed to 50 mg IV every 8 hours.  Solu-Cortef was discontinued and patient started on prednisone 40 mg daily on 03/20/2021 for 5 days.  She was not on steroids prior to admission.  Hypomagnesemia-replete 5. Hypokalemia-replete 6. Leg wounds-POA-continue local wound care. 7. Diarrhea-patient has history of ulcerative colitis.  She is followed by [facility].  Called and discussed with Dr. Alessandra Bevels, he recommended to get GI pathogen panel and call them if abnormal.  GI pathogen panel is negative,   C. difficile PCR was negative.  GI recommended Imodium.  Diarrhea has improved with as needed Imodium. 8. Hypertension-continue diltiazem 180 mg daily, metoprolol 25 mg p.o. twice daily 9. History of breast cancer-patient no longer on tamoxifen. 10. Goals of care-patient is under hospice care, plan to discharge back to assisted living facility with hospice.   Scheduled medications:   . clonazePAM  0.5 mg Oral BID  . collagenase   Topical Daily  . digoxin  0.125 mg Oral Daily  . diltiazem  180 mg Oral Daily  . escitalopram  10 mg Oral Daily  . feeding supplement  1 Container Oral TID BM  . hydrOXYzine  25 mg Oral BID  . Mesalamine  400  mg Oral Daily  . metoprolol tartrate  25 mg Oral BID  . metroNIDAZOLE  500 mg Oral Q8H  . predniSONE  40 mg Oral Q breakfast  . Rivaroxaban  15 mg Oral QHS  . saccharomyces boulardii  250 mg Oral BID  . sodium chloride flush  3 mL Intravenous Q12H         Data Reviewed:   CBG:  No results for input(s): GLUCAP in the last 168 hours.  SpO2: 100 % O2 Flow Rate (L/min): 2 L/min    Vitals:   03/23/21 0500 03/23/21 0903 03/23/21 1234 03/23/21 1610  BP: 116/62 100/61 106/61 120/86  Pulse: 64 61 69 (!) 124  Resp: 17  16 16   Temp: (!) 97.5 F (36.4 C)  (!) 97.1 F (36.2 C) 97.6 F (36.4 C)  TempSrc: Oral  Axillary Axillary  SpO2:   96% 100%  Weight:      Height:         Intake/Output Summary (Last 24 hours) at 03/23/2021 1645 Last data filed at 03/23/2021 0919 Gross per 24 hour  Intake 483 ml  Output 550 ml  Net -67 ml    04/30 1901 - 05/02 0700 In: 963 [P.O.:960; I.V.:3] Out: 1150 [Urine:1150]  Filed Weights   03/19/21 0555 03/20/21 0438 03/22/21 0519  Weight: 53.5 kg 58 kg 57 kg    CBC:  Recent Labs  Lab 03/18/21 0437 03/19/21 0241 03/20/21 0342 03/21/21 0309 03/22/21 0123  WBC 8.8 14.0* 13.1* 10.8* 10.8*  HGB 9.5* 10.4* 10.2* 9.6* 10.2*  HCT 28.7* 31.3* 30.8* 29.0* 31.0*  PLT 172 186 161 153 172  MCV 90.5 90.5 91.4 92.4 92.0  MCH 30.0 30.1 30.3 30.6 30.3  MCHC 33.1 33.2 33.1 33.1 32.9  RDW 15.3 15.4 15.6* 15.9* 16.1*    Complete metabolic panel:  Recent Labs  Lab 03/19/21 0241 03/20/21 0342 03/20/21 1557 03/21/21 0309 03/21/21 0602 03/22/21 0123 03/23/21 0302  NA 135 136  --  135  --  136 136  K 3.4* 3.6  --  4.8  --  4.9 4.7  CL 103 105  --  104  --  104 103  CO2 23 26  --  26  --  27 29  GLUCOSE 124* 95  --  118*  --  158* 129*  BUN 21 20  --  14  --  14 18  CREATININE 0.72 0.63  --  0.61  --  0.58 0.60  CALCIUM 8.1* 7.9*  --  7.8*  --  8.2* 8.2*  MG 1.5*  --  1.7  --  2.3  --  1.8    No results for input(s): LIPASE, AMYLASE in  the last 168 hours.  No results for input(s): CRP, DDIMER, BNP, PROCALCITON, SARSCOV2NAA in the last 168 hours.  Invalid input(s): LACTICACID  ------------------------------------------------------------------------------------------------------------------ No results for input(s): CHOL, HDL, LDLCALC, TRIG, CHOLHDL, LDLDIRECT in the last 72 hours.  Lab Results  Component Value Date   HGBA1C 4.6 (L) 08/07/2018   ------------------------------------------------------------------------------------------------------------------ No results for input(s): TSH, T4TOTAL, T3FREE, THYROIDAB in the last 72 hours.  Invalid input(s): FREET3 ------------------------------------------------------------------------------------------------------------------ No results for input(s): VITAMINB12,  FOLATE, FERRITIN, TIBC, IRON, RETICCTPCT in the last 72 hours.  Coagulation profile  No results for input(s): INR, PROTIME in the last 168 hours.  No results for input(s): DDIMER in the last 72 hours.  Cardiac Enzymes  No results for input(s): CKMB, TROPONINI, MYOGLOBIN in the last 168 hours.  Invalid input(s): CK ------------------------------------------------------------------------------------------------------------------    Component Value Date/Time   BNP 478.7 (H) 01/24/2021 1610     Antibiotics: Anti-infectives (From admission, onward)   Start     Dose/Rate Route Frequency Ordered Stop   03/18/21 1000  Oritavancin Diphosphate (ORBACTIV) 1,200 mg in dextrose 5 % IVPB        1,200 mg 333.3 mL/hr over 180 Minutes Intravenous Once 03/18/21 0841 03/18/21 1335   03/17/21 2000  DAPTOmycin (CUBICIN) 400 mg in sodium chloride 0.9 % IVPB  Status:  Discontinued        8 mg/kg  49.3 kg 116 mL/hr over 30 Minutes Intravenous Daily 03/16/21 1023 03/18/21 0841   03/16/21 0800  vancomycin (VANCOREADY) IVPB 1000 mg/200 mL  Status:  Discontinued        1,000 mg 200 mL/hr over 60 Minutes Intravenous Every 48  hours 03/15/21 0538 03/16/21 1023   03/16/21 0600  vancomycin (VANCOREADY) IVPB 500 mg/100 mL  Status:  Discontinued        500 mg 100 mL/hr over 60 Minutes Intravenous Every 48 hours 03/14/21 0757 03/14/21 1657   03/15/21 1400  metroNIDAZOLE (FLAGYL) tablet 500 mg        500 mg Oral Every 8 hours 03/15/21 1119 03/25/21 1359   03/15/21 0800  ceFEPIme (MAXIPIME) 2 g in sodium chloride 0.9 % 100 mL IVPB  Status:  Discontinued        2 g 200 mL/hr over 30 Minutes Intravenous Every 24 hours 03/14/21 0757 03/14/21 1657   03/14/21 1800  Ampicillin-Sulbactam (UNASYN) 3 g in sodium chloride 0.9 % 100 mL IVPB  Status:  Discontinued        3 g 200 mL/hr over 30 Minutes Intravenous Every 8 hours 03/14/21 1706 03/15/21 1117   03/14/21 1745  Ampicillin-Sulbactam (UNASYN) 3 g in sodium chloride 0.9 % 100 mL IVPB  Status:  Discontinued        3 g 200 mL/hr over 30 Minutes Intravenous Every 6 hours 03/14/21 1657 03/14/21 1705   03/14/21 0645  ceFEPIme (MAXIPIME) 2 g in sodium chloride 0.9 % 100 mL IVPB        2 g 200 mL/hr over 30 Minutes Intravenous  Once 03/14/21 0632 03/14/21 1512   03/14/21 0645  metroNIDAZOLE (FLAGYL) IVPB 500 mg        500 mg 100 mL/hr over 60 Minutes Intravenous  Once 03/14/21 0632 03/14/21 1512   03/14/21 0645  vancomycin (VANCOCIN) IVPB 1000 mg/200 mL premix        1,000 mg 200 mL/hr over 60 Minutes Intravenous  Once 03/14/21 1093 03/14/21 1512       Radiology Reports  No results found.    DVT prophylaxis: Xarelto  Code Status: DNR  Family Communication: No family at bedside   Consultants:    Procedures:      Objective    Physical Examination:   General-appears in no acute distress  Heart-S1-S2, regular, no murmur auscultated  Lungs-clear to auscultation bilaterally, no wheezing or crackles auscultated  Abdomen-soft, nontender, no organomegaly  Extremities-no edema in the lower extremities  Neuro-alert, oriented x3, no focal deficit  noted   Status is: Inpatient  Dispo: The  patient is from: Home              Anticipated d/c is to: Skilled nursing facility              Anticipated d/c date is: 03/24/2021              Patient currently not stable for discharge  Barrier to discharge-awaiting bed at skilled nursing facility  COVID-19 Labs  No results for input(s): DDIMER, FERRITIN, LDH, CRP in the last 72 hours.  Lab Results  Component Value Date   [place] NEGATIVE 03/14/2021   [place] NEGATIVE 02/02/2021   [place] NEGATIVE 01/24/2021   [place] NEGATIVE 01/20/2021    Microbiology  Recent Results (from the past 240 hour(s))  Resp Panel by RT-PCR (Flu A&B, Covid) Nasopharyngeal Swab     Status: None   Collection Time: 03/14/21  6:31 AM   Specimen: Nasopharyngeal Swab; Nasopharyngeal(NP) swabs in vial transport medium  Result Value Ref Range Status   SARS Coronavirus 2 by RT PCR NEGATIVE NEGATIVE Final    Comment: (NOTE) SARS-CoV-2 target nucleic acids are NOT DETECTED.  The SARS-CoV-2 RNA is generally detectable in upper respiratory specimens during the acute phase of infection. The lowest concentration of SARS-CoV-2 viral copies this assay can detect is 138 copies/mL. A negative result does not preclude SARS-Cov-2 infection and should not be used as the sole basis for treatment or other patient management decisions. A negative result may occur with  improper specimen collection/handling, submission of specimen other than nasopharyngeal swab, presence of viral mutation(s) within the areas targeted by this assay, and inadequate number of viral copies(<138 copies/mL). A negative result must be combined with clinical observations, patient history, and epidemiological information. The expected result is Negative.  Fact Sheet for Patients:  EntrepreneurPulse.com.au  Fact Sheet for Healthcare Providers:  IncredibleEmployment.be  This test is no t yet  approved or cleared by the [place] FDA and  has been authorized for detection and/or diagnosis of SARS-CoV-2 by FDA under an Emergency Use Authorization (EUA). This EUA will remain  in effect (meaning this test can be used) for the duration of the COVID-19 declaration under Section 564(b)(1) of the Act, 21 U.S.C.section 360bbb-3(b)(1), unless the authorization is terminated  or revoked sooner.       Influenza A by PCR NEGATIVE NEGATIVE Final   Influenza B by PCR NEGATIVE NEGATIVE Final    Comment: (NOTE) The Xpert Xpress SARS-CoV-2/FLU/RSV plus assay is intended as an aid in the diagnosis of influenza from Nasopharyngeal swab specimens and should not be used as a sole basis for treatment. Nasal washings and aspirates are unacceptable for Xpert Xpress SARS-CoV-2/FLU/RSV testing.  Fact Sheet for Patients: EntrepreneurPulse.com.au  Fact Sheet for Healthcare Providers: IncredibleEmployment.be  This test is not yet approved or cleared by the [place] FDA and has been authorized for detection and/or diagnosis of SARS-CoV-2 by FDA under an Emergency Use Authorization (EUA). This EUA will remain in effect (meaning this test can be used) for the duration of the COVID-19 declaration under Section 564(b)(1) of the Act, 21 U.S.C. section 360bbb-3(b)(1), unless the authorization is terminated or revoked.  Performed at [facility]   Blood Culture (routine x 2)     Status: Abnormal   Collection Time: 03/14/21  6:31 AM   Specimen: BLOOD  Result Value Ref Range Status   Specimen Description   Final    BLOOD BLOOD LEFT FOREARM Performed at [facility]    Special Requests  Final    BOTTLES DRAWN AEROBIC AND ANAEROBIC Blood Culture adequate volume Performed at [facility]    Culture  Setup Time   Final    GRAM POSITIVE COCCI ANAEROBIC BOTTLE ONLY CRITICAL VALUE NOTED.  VALUE IS  CONSISTENT WITH PREVIOUSLY REPORTED AND CALLED VALUE.    Culture (A)  Final    STAPHYLOCOCCUS AUREUS SUSCEPTIBILITIES PERFORMED ON PREVIOUS CULTURE WITHIN THE LAST 5 DAYS. Performed at [facility], [place] [address] [place], [place] [address]    Report Status 03/17/2021 FINAL  Final  Group A Strep by PCR     Status: None   Collection Time: 03/14/21  6:31 AM   Specimen: Peripheral; Sterile Swab  Result Value Ref Range Status   Group A Strep by PCR NOT DETECTED NOT DETECTED Final    Comment: Performed at [facility]  Blood Culture (routine x 2)     Status: Abnormal   Collection Time: 03/14/21  6:36 AM   Specimen: BLOOD  Result Value Ref Range Status   Specimen Description   Final    BLOOD BLOOD LEFT WRIST Performed at [facility]    Special Requests   Final    BOTTLES DRAWN AEROBIC AND ANAEROBIC Blood Culture adequate volume Performed at [facility]    Culture  Setup Time   Final    GRAM POSITIVE COCCI AEROBIC BOTTLE ONLY CRITICAL RESULT CALLED TO, READ BACK BY AND VERIFIED WITH: Beulah Gandy 03/15/2021 at [email or web address] Performed at [facility], [place] [address], [place], [place] [address]    Culture METHICILLIN RESISTANT STAPHYLOCOCCUS AUREUS (A)  Final   Report Status 03/17/2021 FINAL  Final   Organism ID, Bacteria METHICILLIN RESISTANT STAPHYLOCOCCUS AUREUS  Final      Susceptibility   Methicillin resistant staphylococcus aureus - MIC*    CIPROFLOXACIN >=8 RESISTANT Resistant     ERYTHROMYCIN >=8 RESISTANT Resistant     GENTAMICIN <=0.5 SENSITIVE Sensitive     OXACILLIN >=4 RESISTANT Resistant     TETRACYCLINE <=1 SENSITIVE Sensitive     VANCOMYCIN <=0.5 SENSITIVE Sensitive     TRIMETH/SULFA >=320 RESISTANT Resistant     CLINDAMYCIN <=0.25 SENSITIVE Sensitive     RIFAMPIN <=0.5 SENSITIVE Sensitive     Inducible Clindamycin NEGATIVE Sensitive     * METHICILLIN RESISTANT STAPHYLOCOCCUS AUREUS   Blood Culture ID Panel (Reflexed)     Status: Abnormal   Collection Time: 03/14/21  6:36 AM  Result Value Ref Range Status   Enterococcus faecalis NOT DETECTED NOT DETECTED Final   Enterococcus Faecium NOT DETECTED NOT DETECTED Final   Listeria monocytogenes NOT DETECTED NOT DETECTED Final   Staphylococcus species DETECTED (A) NOT DETECTED Final    Comment: CRITICAL RESULT CALLED TO, READ BACK BY AND VERIFIED WITH: PHARMD JAMES LEDFORD 03/15/2021 AT 0503 A.HUGHES    Staphylococcus aureus (BCID) DETECTED (A) NOT DETECTED Final    Comment: Methicillin (oxacillin)-resistant Staphylococcus aureus (MRSA). MRSA is predictably resistant to beta-lactam antibiotics (except ceftaroline). Preferred therapy is vancomycin unless clinically contraindicated. Patient requires contact precautions if  hospitalized. CRITICAL RESULT CALLED TO, READ BACK BY AND VERIFIED WITH: PHARMD JAMES LEDFORD 03/15/2021 AT 0503 A.HUGHES    Staphylococcus epidermidis NOT DETECTED NOT DETECTED Final   Staphylococcus lugdunensis NOT DETECTED NOT DETECTED Final   Streptococcus species NOT DETECTED NOT DETECTED Final   Streptococcus agalactiae NOT DETECTED NOT DETECTED Final   Streptococcus pneumoniae NOT DETECTED NOT DETECTED Final   Streptococcus  pyogenes NOT DETECTED NOT DETECTED Final   A.calcoaceticus-baumannii NOT DETECTED NOT DETECTED Final   Bacteroides fragilis NOT DETECTED NOT DETECTED Final   Enterobacterales NOT DETECTED NOT DETECTED Final   Enterobacter cloacae complex NOT DETECTED NOT DETECTED Final   Escherichia coli NOT DETECTED NOT DETECTED Final   Klebsiella aerogenes NOT DETECTED NOT DETECTED Final   Klebsiella oxytoca NOT DETECTED NOT DETECTED Final   Klebsiella pneumoniae NOT DETECTED NOT DETECTED Final   Proteus species NOT DETECTED NOT DETECTED Final   Salmonella species NOT DETECTED NOT DETECTED Final   Serratia marcescens NOT DETECTED NOT DETECTED Final   Haemophilus influenzae NOT DETECTED NOT  DETECTED Final   Neisseria meningitidis NOT DETECTED NOT DETECTED Final   Pseudomonas aeruginosa NOT DETECTED NOT DETECTED Final   Stenotrophomonas maltophilia NOT DETECTED NOT DETECTED Final   Candida albicans NOT DETECTED NOT DETECTED Final   Candida auris NOT DETECTED NOT DETECTED Final   Candida glabrata NOT DETECTED NOT DETECTED Final   Candida krusei NOT DETECTED NOT DETECTED Final   Candida parapsilosis NOT DETECTED NOT DETECTED Final   Candida tropicalis NOT DETECTED NOT DETECTED Final   Cryptococcus neoformans/gattii NOT DETECTED NOT DETECTED Final   Meth resistant mecA/C and MREJ DETECTED (A) NOT DETECTED Final    Comment: CRITICAL RESULT CALLED TO, READ BACK BY AND VERIFIED WITH: PHARMD JAMES LEDFORD 03/15/2021 AT 0503 A.HUGHES Performed at [facility], [place] [address] [place], [place] [address]   Urine culture     Status: None   Collection Time: 03/14/21 10:44 AM   Specimen: In/Out Cath Urine  Result Value Ref Range Status   Specimen Description   Final    IN/OUT CATH URINE Performed at [facility]    Special Requests NONE Performed at [facility]   Final   Culture   Final    NO GROWTH Performed at [facility], [place] [address] [place], [place] [address]    Report Status 03/15/2021 FINAL  Final  MRSA PCR Screening     Status: Abnormal   Collection Time: 03/14/21  6:00 PM   Specimen: Nasopharyngeal  Result Value Ref Range Status   MRSA by PCR POSITIVE (A) NEGATIVE Final    Comment:        The GeneXpert MRSA Assay (FDA approved for NASAL specimens only), is one component of a comprehensive MRSA colonization surveillance program. It is not intended to diagnose MRSA infection nor to guide or monitor treatment for MRSA infections. RESULT CALLED TO, READ BACK BY AND VERIFIED WITH: K,HOLDEN RN @2022  03/14/21 EB Performed at [facility] [address], [place], [place] [address]   Respiratory (~20  pathogens) panel by PCR     Status: None   Collection Time: 03/14/21  6:30 PM  Result Value Ref Range Status   Adenovirus NOT DETECTED NOT DETECTED Final   Coronavirus 229E NOT DETECTED NOT DETECTED Final    Comment: (NOTE) The Coronavirus on the Respiratory Panel, DOES NOT test for the novel  Coronavirus (2019 nCoV)    Coronavirus HKU1 NOT DETECTED NOT DETECTED Final   Coronavirus NL63 NOT DETECTED NOT DETECTED Final   Coronavirus OC43 NOT DETECTED NOT DETECTED Final   Metapneumovirus NOT DETECTED NOT DETECTED Final   Rhinovirus / Enterovirus NOT DETECTED NOT DETECTED Final   Influenza A NOT DETECTED NOT DETECTED Final   Influenza B NOT DETECTED NOT DETECTED Final   Parainfluenza Virus 1 NOT DETECTED NOT DETECTED Final  Parainfluenza Virus 2 NOT DETECTED NOT DETECTED Final   Parainfluenza Virus 3 NOT DETECTED NOT DETECTED Final   Parainfluenza Virus 4 NOT DETECTED NOT DETECTED Final   Respiratory Syncytial Virus NOT DETECTED NOT DETECTED Final   Bordetella pertussis NOT DETECTED NOT DETECTED Final   Bordetella Parapertussis NOT DETECTED NOT DETECTED Final   Chlamydophila pneumoniae NOT DETECTED NOT DETECTED Final   Mycoplasma pneumoniae NOT DETECTED NOT DETECTED Final    Comment: Performed at [facility], [place] [address], [place], [place] [address]  C Difficile Quick Screen w PCR reflex     Status: None   Collection Time: 03/15/21  6:00 PM   Specimen: STOOL  Result Value Ref Range Status   C Diff antigen NEGATIVE NEGATIVE Final   C Diff toxin NEGATIVE NEGATIVE Final   C Diff interpretation No C. difficile detected.  Final    Comment: Performed at [facility], [place] [address], [place], [place] [address]  Culture, blood (routine x 2)     Status: None   Collection Time: 03/16/21  9:28 AM   Specimen: BLOOD  Result Value Ref Range Status   Specimen Description BLOOD RIGHT ANTECUBITAL  Final   Special Requests   Final    BOTTLES DRAWN AEROBIC AND ANAEROBIC Blood  Culture adequate volume   Culture   Final    NO GROWTH 5 DAYS Performed at [facility], [place] [address] [place], [place] [address]    Report Status 03/21/2021 FINAL  Final  Culture, blood (routine x 2)     Status: None   Collection Time: 03/16/21  9:28 AM   Specimen: BLOOD  Result Value Ref Range Status   Specimen Description BLOOD BLOOD RIGHT HAND  Final   Special Requests   Final    BOTTLES DRAWN AEROBIC AND ANAEROBIC Blood Culture results may not be optimal due to an inadequate volume of blood received in culture bottles   Culture   Final    NO GROWTH 5 DAYS Performed at [facility], [place] [address] [place], [place] [address]    Report Status 03/21/2021 FINAL  Final  Culture, blood (routine x 2)     Status: None   Collection Time: 03/17/21  3:12 AM   Specimen: BLOOD LEFT HAND  Result Value Ref Range Status   Specimen Description BLOOD LEFT HAND  Final   Special Requests   Final    BOTTLES DRAWN AEROBIC ONLY Blood Culture adequate volume   Culture   Final    NO GROWTH 5 DAYS Performed at [facility], [place] [address] [place], [place] [address]    Report Status 03/22/2021 FINAL  Final  Culture, blood (routine x 2)     Status: None   Collection Time: 03/17/21  3:13 AM   Specimen: BLOOD  Result Value Ref Range Status   Specimen Description BLOOD SITE NOT SPECIFIED  Final   Special Requests   Final    BOTTLES DRAWN AEROBIC ONLY Blood Culture adequate volume   Culture   Final    NO GROWTH 5 DAYS Performed at [facility], [place] [address] [place], [place] [address]    Report Status 03/22/2021 FINAL  Final  Gastrointestinal Panel by PCR , Stool     Status: None   Collection Time: 03/19/21 10:03 AM   Specimen: Stool  Result Value Ref Range Status   Campylobacter species NOT DETECTED NOT DETECTED Final   Plesimonas shigelloides NOT  DETECTED NOT DETECTED Final   Salmonella species NOT DETECTED NOT DETECTED Final   Yersinia enterocolitica NOT DETECTED  NOT DETECTED Final   Vibrio species NOT DETECTED NOT DETECTED Final   Vibrio cholerae NOT DETECTED NOT DETECTED Final   Enteroaggregative E coli (EAEC) NOT DETECTED NOT DETECTED Final   Enteropathogenic E coli (EPEC) NOT DETECTED NOT DETECTED Final   Enterotoxigenic E coli (ETEC) NOT DETECTED NOT DETECTED Final   Shiga like toxin producing E coli (STEC) NOT DETECTED NOT DETECTED Final   Shigella/Enteroinvasive E coli (EIEC) NOT DETECTED NOT DETECTED Final   Cryptosporidium NOT DETECTED NOT DETECTED Final   Cyclospora cayetanensis NOT DETECTED NOT DETECTED Final   Entamoeba histolytica NOT DETECTED NOT DETECTED Final   Giardia lamblia NOT DETECTED NOT DETECTED Final   Adenovirus F40/41 NOT DETECTED NOT DETECTED Final   Astrovirus NOT DETECTED NOT DETECTED Final   Norovirus GI/GII NOT DETECTED NOT DETECTED Final   Rotavirus A NOT DETECTED NOT DETECTED Final   Sapovirus (I, II, IV, and V) NOT DETECTED NOT DETECTED Final    Comment: Performed at [facility], [place] [place], [place] [address]    Pressure Injury 03/14/21 Heel Right Unstageable - Full thickness tissue loss in which the base of the injury is covered by slough (yellow, tan, gray, green or brown) and/or eschar (tan, brown or black) in the wound bed. Yellow Slough visible; WOC updated (Active)  03/14/21 1630  Location: Heel  Location Orientation: Right  Staging: Unstageable - Full thickness tissue loss in which the base of the injury is covered by slough (yellow, tan, gray, green or brown) and/or eschar (tan, brown or black) in the wound bed.  Wound Description (Comments): Yellow Slough visible; WOC updated to Unstagable slough covering wound base.  Present on Admission: Yes          [place]   [facility] If 7PM-7AM, please contact night-coverage at [email or web address], Office  [phone]   03/23/2021, 4:45 PM  LOS: 9 days

## 2021-03-23 NOTE — Progress Notes (Addendum)
The patient has been seen in conjunction with Kathyrn Drown, NP. All aspects of care have been considered and discussed. The patient has been personally interviewed, examined, and all clinical data has been reviewed.   Luckily, back in sinus rhythm currently.  Continue digoxin and diltiazem.  Continue rivaroxaban therapy for stroke prophylaxis.  We will follow and advise as needed.    Progress Note  Patient Name: Michele Green Date of Encounter: 03/23/2021  Primary Cardiologist: Dr. Daneen Schick, MD   Subjective   Doing well today. Pt converted to NSR/SB. No chest pain, SOB  Inpatient Medications    Scheduled Meds: . clonazePAM  0.5 mg Oral BID  . collagenase   Topical Daily  . digoxin  0.125 mg Oral Daily  . diltiazem  180 mg Oral Daily  . escitalopram  10 mg Oral Daily  . feeding supplement  1 Container Oral TID BM  . hydrOXYzine  25 mg Oral BID  . Mesalamine  400 mg Oral Daily  . metoprolol tartrate  25 mg Oral BID  . metroNIDAZOLE  500 mg Oral Q8H  . predniSONE  40 mg Oral Q breakfast  . Rivaroxaban  15 mg Oral QHS  . saccharomyces boulardii  250 mg Oral BID  . sodium chloride flush  3 mL Intravenous Q12H   Continuous Infusions: . sodium chloride 500 mL (03/19/21 1502)   PRN Meds: sodium chloride, acetaminophen **OR** acetaminophen, HYDROmorphone (DILAUDID) injection, loperamide, ondansetron **OR** ondansetron (ZOFRAN) IV, oxyCODONE   Vital Signs    Vitals:   03/22/21 0519 03/22/21 0919 03/22/21 1016 03/22/21 2119  BP: 113/76 (!) 100/59 103/75 108/66  Pulse: (!) 135 (!) 141 (!) 136 67  Resp: 18   18  Temp: 98 F (36.7 C)   97.8 F (36.6 C)  TempSrc: Oral   Oral  SpO2: 96% 97% 97% 97%  Weight: 57 kg     Height:        Intake/Output Summary (Last 24 hours) at 03/23/2021 0458 Last data filed at 03/22/2021 0600 Gross per 24 hour  Intake --  Output 600 ml  Net -600 ml   Filed Weights   03/19/21 0555 03/20/21 0438 03/22/21 0519  Weight: 53.5 kg 58  kg 57 kg    Physical Exam   General: Elderly, NAD Neck: Negative for carotid bruits. No JVD Lungs:Clear to ausculation bilaterally. No wheezes, rales, or rhonchi. Breathing is unlabored. Cardiovascular: RRR with S1 S2. No murmurs Abdomen: Soft, non-tender, non-distended. No obvious abdominal masses. Extremities: No edema. Radial pulses 2+ bilaterally Neuro: Alert and oriented. No focal deficits. No facial asymmetry. MAE spontaneously. Psych: Responds to questions appropriately with normal affect.    Labs    Chemistry Recent Labs  Lab 03/21/21 0309 03/22/21 0123 03/23/21 0302  NA 135 136 136  K 4.8 4.9 4.7  CL 104 104 103  CO2 26 27 29   GLUCOSE 118* 158* 129*  BUN 14 14 18   CREATININE 0.61 0.58 0.60  CALCIUM 7.8* 8.2* 8.2*  GFRNONAA >60 >60 >60  ANIONGAP 5 5 4*     Hematology Recent Labs  Lab 03/20/21 0342 03/21/21 0309 03/22/21 0123  WBC 13.1* 10.8* 10.8*  RBC 3.37* 3.14* 3.37*  HGB 10.2* 9.6* 10.2*  HCT 30.8* 29.0* 31.0*  MCV 91.4 92.4 92.0  MCH 30.3 30.6 30.3  MCHC 33.1 33.1 32.9  RDW 15.6* 15.9* 16.1*  PLT 161 153 172    Cardiac EnzymesNo results for input(s): TROPONINI in the last 168 hours.  No results for input(s): TROPIPOC in the last 168 hours.   BNPNo results for input(s): BNP, PROBNP in the last 168 hours.   DDimer No results for input(s): DDIMER in the last 168 hours.   Radiology    No results found.  Telemetry    03/23/21 SB with HR in the 50's - Personally Reviewed  ECG    No new tracing as of 03/23/21- Personally Reviewed  Cardiac Studies   TTE 03/15/21:  1. Left ventricular ejection fraction, by estimation, is 60 to 65%. The  left ventricle has normal function. The left ventricle has no regional  wall motion abnormalities. Left ventricular diastolic parameters are  indeterminate.  2. Right ventricular systolic function is normal. The right ventricular  size is normal.  3. Left atrial size was severely dilated.  4. The mitral  valve has been repaired/replaced. Mild mitral valve  regurgitation. Mild mitral stenosis. There is a prosthetic annuloplasty  ring present in the mitral position.  5. Tricuspid valve regurgitation is mild to moderate.  6. The aortic valve is tricuspid. Aortic valve regurgitation is not  visualized. No aortic stenosis is present.   Patient Profile     80 y.o. female with a history of hypertension, ulcerative colitis, permanent atrial fibrillation/flutter, coronary artery disease status post CABG, diastolic heart failure, breast cancer, hypertension, hyperlipidemia who presented with acute parotitis and MRSA bacteremia now with rapid heart rates.  Assessment & Plan    1. Permanent atrial fibrillation/flutter: -Long hx of AF s/p MAZE, failed ablations and failed cardoiversion procedures with most recent plan for rate control however this has been somewhat complicated by soft BP's. She was found to be in AF with RVR on hospital presentation (secondary to sepsis in the setting of acute parotitis and MRSA bacteremia) however was noted to be in atrial tachycardia per Dr. Shonna Chock note at a one-to -one conduction. Her diltiazem was increased yesterday -Telemetry today shows  -Plan was to continue with rate control as she remains asymptomatic>>>fortunately patient converted to SB early this AM with HR's in the 50's   -Continue dioxin 0.143m PO QD, Diltiazem 1825mQD, Metoprolol 2557mID  -Continue anticoagulation with Xarelto 31m46m -CBC stable  2. Soft BPs: -BP stable but soft at 108/66>>103/75>>100/59 -Continue dioxin 0.125mg69mQD, Diltiazem 180mg 33mMetoprolol 25mg B53m 3. Sepsis secondary to acute right parotitis and MRSA bacteremia: -Negative BC's  -Seen by ID -Currently being treated with metronidazole with plans to initiate Linezolid 03/28/21  4. Ulcerative colitis with ongoing diarrhea: -Follows with OP GI -C. Diff  Negative -Continue management per primary team      Signed, Jill McKathyrn [place] [phone], 4:58 AM     For questions or updates, please contact   Please consult [email or web address] for contact info under Cardiology/STEMI.

## 2021-03-23 NOTE — TOC Progression Note (Addendum)
Transition of Care [facility]) - Progression Note    Patient Details  Name: JENNIKA RINGGOLD MRN: [id number] Date of Birth: 11-25-1940  Transition of Care [facility]) CM/SW [place], [place] Phone Number: 03/23/2021, 2:33 PM  Clinical Narrative:     CSW spoke with [name] and confirmed dc plan for patient . [name] confirmed that patient will not be SNF but to return back to ALF with hospice services to follow. CSW also has spoken to [facility] and confirmed dc plan for patient. CSW spoke with ALF who confirmed they can accept patient for dc when medically ready. CSW will continue to follow and assist with discharge planning needs.    Expected Discharge Plan: Assisted Living (back to ALF [facility] with hospice services to follow) Barriers to Discharge: Continued Medical Work up  Expected Discharge Plan and Services Expected Discharge Plan: Assisted Living (back to ALF [facility] with hospice services to follow) In-house Referral: Clinical Social Work     Living arrangements for the past 2 months: [place] ([facility])                                       Social Determinants of Health (SDOH) Interventions    Readmission Risk Interventions Readmission Risk Prevention Plan 03/18/2021 01/19/2021  Transportation Screening Complete -  Medication Review [employer] photographer) - Complete  PCP or Specialist appointment within 3-5 days of discharge - Complete  HRI or [place] - Complete  SW Recovery Care/Counseling Consult - Complete  Palliative Care Screening Complete Complete  Skilled Nursing Facility Not Applicable Complete  Some recent data might be hidden

## 2021-03-23 NOTE — Progress Notes (Signed)
Pt's HR is now 124 in SVT.  Tried to bear down x2, unsuccessful.  Pt does not feel SOB or palpitations.  Idolina Primer, RN

## 2021-03-23 NOTE — Progress Notes (Signed)
[name] [id number] Hospice Patient [facility])  Jovon Streetman [facility]. Facility notified ACC on callstaffthat patient was experiencing swelling on therightside of herneck withassociatedpain. After assessment and discussing with patient's son, the decision was made to have EMS transport her to ED for evaluation. Patient was admitted to [facility] on 4/23 with a diagnosis ofsepsis due to acute parotiditis and MRSA bacteremia.Per [facility] physician Dr. Gilford Rile, this is a related admission.  Visited pt at the bedside, she was asleep. Plan to d/c today, but patient with episodes of SVT. Likely can d/c tomorrow back to her facility.  V/S: 97.6 oral, 120/86, HR 124, RR 16, SPO2 100% on O2 via [place] @ 2 lpm IVs: magnesium sulfate 2 g IV x 1 Labs: unremarkable  Problem List: - sepsis due to acute parodititis and MRSA bacteremia - As per ID sign off note 4/27, 4 weeks of linezolid to be started 10 days after dose of oritavancin, for empirically treating endocarditis if repeat blood cultures are negative in 48 hours and, continue metronidazole x10 days, monitor CBC at least every 2 weeks while on linezolid  - a. Fib/a. Flutter - during visit pt was in NSR, HR is now back in the 130s presumably afib with RVR vs atrial tachycardia, digoxin 0.161m PO QD, Diltiazem 1878mQD, Metoprolol 2582mID   - diarrhea - no diarrhea today  - diffuse weakness - PT evaluated, recommend SNF, family wants her to return to AL with hospice, was hopeful to see if she was at least able to get to wheelchair  [place], treat with antibiotics but return to AL with hospice support D/C planning: ongoing, supposed to d/c to AL once medically optimized IDT: hospice team updated Family: not present in room during visit, TOCThe [facility]' Hospital In Anadarkonager discussed with son plans to dc Tuesday, I was unable to reach him  today  JenVenia Carbon, BSN, CCRWaimanaloCGracie Square Hospitalaison

## 2021-03-24 DIAGNOSIS — K1120 Sialoadenitis, unspecified: Secondary | ICD-10-CM

## 2021-03-24 LAB — DIGOXIN LEVEL: Digoxin Level: 0.9 ng/mL (ref 0.8–2.0)

## 2021-03-24 LAB — SARS CORONAVIRUS 2 (TAT 6-24 HRS): SARS Coronavirus 2: NEGATIVE

## 2021-03-24 MED ORDER — METOPROLOL TARTRATE 25 MG PO TABS
25.0000 mg | ORAL_TABLET | Freq: Two times a day (BID) | ORAL | Status: AC
Start: 2021-03-24 — End: ?

## 2021-03-24 MED ORDER — LINEZOLID 600 MG PO TABS: 600.0000 mg | ORAL_TABLET | Freq: Two times a day (BID) | ORAL | 0 refills | Status: AC

## 2021-03-24 MED ORDER — SACCHAROMYCES BOULARDII 250 MG PO CAPS: 250.0000 mg | ORAL_CAPSULE | Freq: Two times a day (BID) | ORAL | Status: AC

## 2021-03-24 MED ORDER — METRONIDAZOLE 500 MG PO TABS: 500.0000 mg | ORAL_TABLET | Freq: Three times a day (TID) | ORAL | 0 refills | Status: AC

## 2021-03-24 MED ORDER — DIGOXIN 125 MCG PO TABS: 0.1250 mg | ORAL_TABLET | Freq: Every day | ORAL | Status: AC

## 2021-03-24 MED ORDER — LOPERAMIDE HCL 2 MG PO CAPS: 4.0000 mg | ORAL_CAPSULE | Freq: Three times a day (TID) | ORAL | 0 refills | Status: AC | PRN

## 2021-03-24 MED ORDER — LINEZOLID 600 MG PO TABS: 600.0000 mg | ORAL_TABLET | Freq: Two times a day (BID) | ORAL | Status: DC

## 2021-03-24 MED ORDER — CLONAZEPAM 0.5 MG PO TABS: 0.5000 mg | ORAL_TABLET | Freq: Two times a day (BID) | ORAL | 0 refills | Status: AC | PRN

## 2021-03-24 MED ORDER — OXYCODONE HCL 5 MG PO TABS: 5.0000 mg | ORAL_TABLET | Freq: Four times a day (QID) | ORAL | 0 refills | Status: AC | PRN

## 2021-03-24 MED ORDER — ESCITALOPRAM OXALATE 10 MG PO TABS: 5.0000 mg | ORAL_TABLET | Freq: Every day | ORAL | Status: DC

## 2021-03-24 MED ORDER — DILTIAZEM HCL ER COATED BEADS 180 MG PO CP24
180.0000 mg | ORAL_CAPSULE | Freq: Every day | ORAL | Status: AC
Start: 2021-03-25 — End: ?

## 2021-03-24 NOTE — Progress Notes (Addendum)
The patient has been seen in conjunction with Reino Bellis, NP. All aspects of care have been considered and discussed. The patient has been personally interviewed, examined, and all clinical data has been reviewed.   Dig level is okay.  Continue the current dose.  She needs to have a repeat dig level done in 1 week.  Okay to discharge today.  We have no further cardiac recommendations.   Progress Note  Patient Name: Michele Green Date of Encounter: 03/24/2021  [facility] Cardiologist: Sinclair Grooms, MD   Subjective   Laying in bed. No specific complaints, but quite weak this morning.   Inpatient Medications    Scheduled Meds: . clonazePAM  0.5 mg Oral BID  . collagenase   Topical Daily  . digoxin  0.125 mg Oral Daily  . diltiazem  180 mg Oral Daily  . escitalopram  10 mg Oral Daily  . feeding supplement  1 Container Oral TID BM  . hydrOXYzine  25 mg Oral BID  . Mesalamine  400 mg Oral Daily  . metoprolol tartrate  25 mg Oral BID  . metroNIDAZOLE  500 mg Oral Q8H  . predniSONE  40 mg Oral Q breakfast  . Rivaroxaban  15 mg Oral QHS  . saccharomyces boulardii  250 mg Oral BID  . sodium chloride flush  3 mL Intravenous Q12H   Continuous Infusions: . sodium chloride 500 mL (03/19/21 1502)   PRN Meds: sodium chloride, acetaminophen **OR** acetaminophen, hydrocortisone cream, HYDROmorphone (DILAUDID) injection, loperamide, ondansetron **OR** ondansetron (ZOFRAN) IV, oxyCODONE   Vital Signs    Vitals:   03/23/21 1725 03/23/21 1800 03/23/21 2010 03/24/21 0321  BP:   120/65 127/64  Pulse: 65 65 65 65  Resp:   16 19  Temp:   98.2 F (36.8 C) 98.5 F (36.9 C)  TempSrc:   Oral Oral  SpO2: 98% 99% 99% 100%  Weight:    57.4 kg  Height:        Intake/Output Summary (Last 24 hours) at 03/24/2021 0834 Last data filed at 03/24/2021 0321 Gross per 24 hour  Intake 246 ml  Output 1150 ml  Net -904 ml   Last 3 Weights 03/24/2021 03/22/2021 03/20/2021  Weight  (lbs) 126 lb 8.7 oz 125 lb 10.6 oz 127 lb 13.9 oz  Weight (kg) 57.4 kg 57 kg 58 kg      Telemetry    Converted back into atrial flutter last evening, episode of SR this morning around 2am, then back into atrial flutter, rates in the 110s - Personally Reviewed  ECG   No new tracing this morning  Physical Exam  Frail, ill-appearing older female GEN: No acute distress.   Neck: No JVD Cardiac: Irreg Irreg tachy, no murmurs, rubs, or gallops.  Respiratory: Clear to auscultation bilaterally. GI: Soft, nontender, non-distended  MS: No edema; No deformity. Diffuse bruising to upper and lower extremities Neuro:  Nonfocal  Psych: Normal affect   Labs    High Sensitivity Troponin:  No results for input(s): TROPONINIHS in the last 720 hours.    Chemistry Recent Labs  Lab 03/21/21 0309 03/22/21 0123 03/23/21 0302  NA 135 136 136  K 4.8 4.9 4.7  CL 104 104 103  CO2 26 27 29   GLUCOSE 118* 158* 129*  BUN 14 14 18   CREATININE 0.61 0.58 0.60  CALCIUM 7.8* 8.2* 8.2*  GFRNONAA >60 >60 >60  ANIONGAP 5 5 4*     Hematology Recent Labs  Lab  03/20/21 0342 03/21/21 0309 03/22/21 0123  WBC 13.1* 10.8* 10.8*  RBC 3.37* 3.14* 3.37*  HGB 10.2* 9.6* 10.2*  HCT 30.8* 29.0* 31.0*  MCV 91.4 92.4 92.0  MCH 30.3 30.6 30.3  MCHC 33.1 33.1 32.9  RDW 15.6* 15.9* 16.1*  PLT 161 153 172    BNPNo results for input(s): BNP, PROBNP in the last 168 hours.   DDimer No results for input(s): DDIMER in the last 168 hours.   Radiology    No results found.  Cardiac Studies   TTE 03/15/21:  1. Left ventricular ejection fraction, by estimation, is 60 to 65%. The  left ventricle has normal function. The left ventricle has no regional  wall motion abnormalities. Left ventricular diastolic parameters are  indeterminate.  2. Right ventricular systolic function is normal. The right ventricular  size is normal.  3. Left atrial size was severely dilated.  4. The mitral valve has been  repaired/replaced. Mild mitral valve  regurgitation. Mild mitral stenosis. There is a prosthetic annuloplasty  ring present in the mitral position.  5. Tricuspid valve regurgitation is mild to moderate.  6. The aortic valve is tricuspid. Aortic valve regurgitation is not  visualized. No aortic stenosis is present.    Patient Profile     80 y.o. female a history of hypertension, ulcerative colitis, permanent atrial fibrillation/flutter, coronary artery disease status post CABG, diastolic heart failure, breast cancer, hypertension, hyperlipidemia who presented with acute parotitis and MRSA bacteremia now with rapid heart rates.  Assessment & Plan    1. Permanent atrial fibrillation/flutter: Long hx of AF s/p MAZE, failed ablations and failed cardoiversion procedures with most recent plan for rate control however this has been somewhat complicated by soft BP's. She was found to be in AF with RVR on hospital presentation (secondary to sepsis in the setting of acute parotitis and MRSA bacteremia) however was noted to be in atrial tachycardia per Dr. Shonna Chock note at a one-to -one conduction. -- Telemetry today shows back into what appears atrial flutter, rates in the 110s. -- Plan was to continue with rate control as she remains asymptomatic, has been difficult to control rates with soft blood pressures. -- Seems more lethargic today, will check Dig level -- Currently on dioxin 0.157m PO QD, Diltiazem 1864mQD, Metoprolol 2531mID  -- Continue anticoagulation with Xarelto 38m38m  2. Soft BPs: BP stable but soft at 108/66>>103/75>>100/59 -- Continue dioxin 0.125mg26mQD, Diltiazem 180mg 24mMetoprolol 25mg B53m 3. Sepsis secondary to acute right parotitis and MRSA bacteremia: -- Negative BC's  -- Seen by ID  -- Currently being treated with metronidazole with plans to initiate Linezolid 03/28/21, per primary  4. Ulcerative colitis with ongoing diarrhea: Follows with OP GI -- C. Diff   Negative -- Continue management per primary team    Currently under hospice care, planned for discharge back to ALF  For questions or updates, please contact [facility] [place] consult [email or web address] for contact info under        Signed, LindsayReino Bellis/01/2021, 8:34 AM

## 2021-03-24 NOTE — Progress Notes (Signed)
Manufacturing engineer [facility]) Hospital Liaison note.    Visited at bedside with Michele Green earlier today.  She denies new symptoms, continues to endorse anxiety.   Confirmed that oxygen concentrator and back up tanks are in place at [facility].  TOC [name] made aware.  ACC will continue to support this patient with hospice services after discharge.    Thank you for the opportunity to participate in this patient's care. [name], BSN, RN [facility] Liaison (listed on [place] under Hospice/[facility])    [phone] (24h on call)

## 2021-03-24 NOTE — TOC Transition Note (Addendum)
Transition of Care [facility]) - CM/SW Discharge Note   Patient Details  Name: Michele Green MRN: [id number] Date of Birth: 03-31-1941  Transition of Care [facility]) CM/SW Contact:  Trula Ore, [place] Phone Number: 03/24/2021, 1:57 PM   Clinical Narrative:     Patient will DC to: [facility] with hospice services to follow   Anticipated DC date: 03/24/2021  Family notified: [name]   Transport by: Charisse Klinefelter   ?  Per MD patient ready for DC to [facility] with hospice services to follow . RN, patient, patient's family, Chrislyn with [facility], and facility notified of DC. Chrislynn with [facility] confirmed that patient has o2 at ALF. Discharge Summary sent to facility. RN given number for report tele# [phone] ask for Mariann Laster RM#[phone]. DC packet on chart. DNR signed by MD attached to patients DC packet on patients hard chart. [facility] requested for patient.  CSW signing off.    Final next level of care: Assisted Living ([facility]) Barriers to Discharge: No Barriers Identified   Patient Goals and CMS Choice   CMS Medicare.gov Compare Post Acute Care list provided to:: Patient Represenative (must comment) [name] patients son) Choice offered to / list presented to : Adult Children ([name])  Discharge Placement              Patient chooses bed at: [place] ([facility]) Patient to be transferred to facility by: [place] Name of family member notified: [name] Patient and family notified of of transfer: 03/24/21  Discharge Plan and Services In-house Referral: Clinical Social Work                                   Social Determinants of Health (SDOH) Interventions     Readmission Risk Interventions Readmission Risk Prevention Plan 03/18/2021 01/19/2021  Transportation Screening Complete -  Medication Review (RN Care Manager) - Complete  PCP or Specialist appointment within 3-5 days of discharge - Complete   HRI or [place] - Complete  SW Recovery Care/Counseling Consult - Complete  Palliative Care Screening Complete Complete  Skilled Nursing Facility Not Applicable Complete  Some recent data might be hidden

## 2021-03-24 NOTE — Progress Notes (Signed)
Report given to a staff receiving pt at [facility].  Idolina Primer, RN

## 2021-03-24 NOTE — Discharge Summary (Signed)
Physician Discharge Summary  Michele Green MGQ:[id number] DOB: 1940/12/04 DOA: 03/14/2021  PCP: [name], MD  Admit date: 03/14/2021 Discharge date: 03/24/2021  Time spent: 50* minutes  Recommendations for Outpatient Follow-up:  1. Patient will need to check digoxin level in 7 days 2. Continue Flagyl for 1 more day, stop after 03/25/2021 3. Start linezolid on 03/28/2021 for 30 days, stop after 04/27/2021   Discharge Diagnoses:  Principal Problem:   MRSA bacteremia Active Problems:   Essential hypertension   Atrial flutter with rapid ventricular response (HCC)   Goals of care, counseling/discussion   Adrenal insufficiency (Addison's disease) ([place])   Functional quadriplegia (HCC)   Acute parotitis   Sepsis ([place])   Multiple open wounds of lower extremity   Discharge Condition: Stable  Diet recommendation: Heart healthy diet  Filed Weights   03/20/21 0438 03/22/21 0519 03/24/21 0321  Weight: 58 kg 57 kg 57.4 kg    History of present illness:  80 year old female with history of ulcerative colitis, chronic loose stools, paroxysmal atrial fibrillation on Xarelto and digoxin s/p maze procedure, CAD s/p CABG, chronic diastolic CHF, Addison's disease, breast cancer, COVID infection in January 2022, hypertension, hyperlipidemia presented to ED with neck pain.  Patient developed right lateral neck and face pain and presented to [facility], [place], found to have SIRS criteria.  Code sepsis was activated.  Patient is on home hospice due to end-stage hypertensive heart disease.  CT soft tissue neck with contrast showed acute parotitis.  No abscess or obstructing process.  Secondary inflammation within the right neck and pharynx.  Patient continued to have sinus tachycardia and diarrhea.  She has history of ulcerative colitis.  C. difficile PCR and GI pathogen panel were negative.  Cardiology was consulted for persistent tachycardia, dose of digoxin was increased.  Heart rate has improved.   Diarrhea has improved after starting Imodium  Hospital Course:   1. Sepsis due to acute parotitis/MRSA bacteremia-patient presented with leukocytosis, WBC 30,000, fever tachycardia and tachypnea.  Initially started on Unasyn and was switched to IV daptomycin and p.o. Flagyl on 03/15/2021 due to MRSA bacteremia.  2D echo showed no endocarditis.  Repeat blood cultures as of 4/25 are negative to date.  ID was consulted.  As blood cultures were negative she was given 1 dose of oritavancin on 03/18/2021 this is to be followed by 4 weeks of linezolid to be started 10 days after dose of oritavancin for empirically treating endocarditis.  Start date for linezolid 03/28/2021, stop date 04/27/2021 .  Plan was to treat with metronidazole for 10 days.  It was started on 03/15/2021.  Continue metronidazole 1 more day, stop after 03/25/2021.    Monitor CBC for at least every 2 weeks while being on the linezolid.  Will discontinue Lexapro and Remeron to prevent SSRI as patient is on linezolid.  Lexapro can be restarted after linezolid course has been completed.  2. MRSA bacteremia-likely from above.  Patient on antibiotics as above.  Antibiotics as above.   Plan for total 6 weeks of antibiotics.  WBC is 10.8 today.  3. Chronic atrial flutter-patient has history of chronic atrial flutter, has been on metoprolol and Cardizem along with digoxin.  She was on rivaroxaban for anticoagulation.  She was noted to have sinus tachycardia, which was persistent so cardiology was consulted.  Dose of digoxin increased per cardiology.  Heart rate is controlled currently on digoxin 0.125 mg daily, Cardizem 180 mg daily, metoprolol 25 mg p.o. twice daily.  Digoxin level  obtained today was 0.9.  Plan to recheck digoxin level in 7 days.  4. History of relative adrenal insufficiency-patient was started on Solu-Cortef 50 mg IV every 6 hours as stress dose steroids on 03/14/2021.  Dose of Solu-Cortef was changed to 50 mg IV every 8 hours.  Solu-Cortef  was discontinued and patient started on prednisone 40 mg daily on 03/20/2021 for 5 days.  Prednisone has been discontinued. She was not on steroids prior to admission.    5. Hypomagnesemia-replete  6. Hypokalemia-replete  7. Leg wounds-POA-continue local wound care.  8. Diarrhea-patient has history of ulcerative colitis.  She is followed by [facility].  Called and discussed with Dr. Alessandra Bevels, he recommended to get GI pathogen panel and call them if abnormal.  GI pathogen panel is negative,   C. difficile PCR was negative.  GI recommended Imodium.  Diarrhea has improved with as needed Imodium.  9. Hypertension-continue diltiazem 180 mg daily, metoprolol 25 mg p.o. twice daily  10. History of breast cancer-patient no longer on tamoxifen.  11. Goals of care-patient is under hospice care, plan to discharge back to assisted living facility with hospice.   Procedures:    Consultations:  Cardiology  Discharge Exam: Vitals:   03/24/21 1033 03/24/21 1229  BP: (!) 124/52   Pulse: 94 (!) 58  Resp:    Temp:    SpO2:      General: Appears in no acute distress Cardiovascular: S1-S2, regular Respiratory: Clear to auscultation bilaterally  Discharge Instructions   Discharge Instructions    Diet - low sodium heart healthy   Complete by: As directed    Discharge wound care:   Complete by: As directed    Xeroform gauze to left lower extremity wound, top with silicone foam.  Change every 3 days and as needed soilage.  Cleanse left heel wound with saline, pat dry.  Apply 1/4 inch thick layer of Santyl to heal wound.  Cover with saline moist 2x2 with top with dry dressing.  Change daily.   Increase activity slowly   Complete by: As directed      Allergies as of 03/24/2021      Reactions   Shellfish Allergy Itching   Amoxicillin-pot Clavulanate Diarrhea      Medication List    STOP taking these medications   ciprofloxacin 500 MG tablet Commonly known as: CIPRO   escitalopram  10 MG tablet Commonly known as: LEXAPRO   furosemide 40 MG tablet Commonly known as: Lasix   mirtazapine 7.5 MG tablet Commonly known as: REMERON   Potassium Chloride ER 20 MEQ Tbcr     TAKE these medications   acetaminophen 500 MG tablet Commonly known as: TYLENOL Take 1,000 mg by mouth every 6 (six) hours as needed for headache (pain).   bisacodyl 10 MG suppository Commonly known as: DULCOLAX Place 10 mg rectally daily as needed for moderate constipation.   camphor-menthol lotion Commonly known as: SARNA Apply 1 application topically See admin instructions. Apply lotion topically to patient's back twice daily for itching   clonazePAM 0.5 MG tablet Commonly known as: KLONOPIN Take 1 tablet (0.5 mg total) by mouth 2 (two) times daily as needed for anxiety (seizures). What changed: reasons to take this   digoxin 0.125 MG tablet Commonly known as: LANOXIN Take 1 tablet (0.125 mg total) by mouth daily. What changed:   medication strength  how much to take   diltiazem 180 MG 24 hr capsule Commonly known as: CARDIZEM CD Take 1 capsule (180  mg total) by mouth daily. Start taking on: Mar 25, 2021 What changed:   medication strength  how much to take   haloperidol 0.5 MG tablet Commonly known as: HALDOL Take 0.5 mg by mouth every 6 (six) hours.   hydrOXYzine 25 MG tablet Commonly known as: ATARAX/VISTARIL Take 25 mg by mouth in the morning and at bedtime.   linezolid 600 MG tablet Commonly known as: ZYVOX Take 1 tablet (600 mg total) by mouth every 12 (twelve) hours. Start taking on: Mar 28, 2021   loperamide 2 MG capsule Commonly known as: IMODIUM Take 2 capsules (4 mg total) by mouth 3 (three) times daily as needed for diarrhea or loose stools.   mesalamine 0.375 g 24 hr capsule Commonly known as: APRISO Take 375 mg by mouth every morning.   metoprolol tartrate 25 MG tablet Commonly known as: LOPRESSOR Take 1 tablet (25 mg total) by mouth 2 (two) times  daily.   metroNIDAZOLE 500 MG tablet Commonly known as: FLAGYL Take 1 tablet (500 mg total) by mouth every 8 (eight) hours for 1 day.   oxyCODONE 5 MG immediate release tablet Commonly known as: Oxy IR/ROXICODONE Take 1 tablet (5 mg total) by mouth every 6 (six) hours as needed for breakthrough pain. What changed:   reasons to take this  Another medication with the same name was removed. Continue taking this medication, and follow the directions you see here.   OXYGEN Inhale 2 L into the lungs continuous.   saccharomyces boulardii 250 MG capsule Commonly known as: FLORASTOR Take 1 capsule (250 mg total) by mouth 2 (two) times daily.   senna-docusate 8.6-50 MG tablet Commonly known as: Senokot-S Take 1 tablet by mouth daily.   Xarelto 15 MG Tabs tablet Generic drug: Rivaroxaban TAKE 1 TABLET BY MOUTH  DAILY WITH SUPPER What changed: how much to take            Discharge Care Instructions  (From admission, onward)         Start     Ordered   03/24/21 0000  Discharge wound care:       Comments: Xeroform gauze to left lower extremity wound, top with silicone foam.  Change every 3 days and as needed soilage.  Cleanse left heel wound with saline, pat dry.  Apply 1/4 inch thick layer of Santyl to heal wound.  Cover with saline moist 2x2 with top with dry dressing.  Change daily.   03/24/21 1230         Allergies  Allergen Reactions  . Shellfish Allergy Itching  . Amoxicillin-Pot Clavulanate Diarrhea      The results of significant diagnostics from this hospitalization (including imaging, microbiology, ancillary and laboratory) are listed below for reference.    Significant Diagnostic Studies: CT Soft Tissue Neck W Contrast  Result Date: 03/14/2021 CLINICAL DATA:  Neck abscess EXAM: CT NECK WITH CONTRAST TECHNIQUE: Multidetector CT imaging of the neck was performed using the standard protocol following the bolus administration of intravenous contrast. CONTRAST:   34m OMNIPAQUE IOHEXOL 300 MG/ML  SOLN COMPARISON:  None. FINDINGS: Pharynx and larynx: Submucosal edema the level of the pharynx and possibly supraglottic larynx which is considered secondary. Salivary glands: Enlargement and hyperenhancement of the right parotid gland. No visible stone or abscess. Thyroid: Negative Lymph nodes: None enlarged or abnormal density. Vascular: Heavily calcified carotid bifurcations. No acute vascular finding Limited intracranial: Negative Visualized orbits: Degraded by motion artifact. Bilateral cataract resection. Mastoids and visualized paranasal sinuses: Chronic left  sphenoid sinusitis with complete opacification. Milder right sphenoid opacification. Skeleton: C5-6 and C6-7 advanced disc degeneration. Upper chest: No acute finding IMPRESSION: Acute parotiditis on the right. No abscess or obstructing process. Secondary inflammation involves the right neck and pharynx. Electronically Signed   By: Monte Fantasia M.D.   On: 03/14/2021 08:36   DG CHEST PORT 1 VIEW  Result Date: 03/20/2021 CLINICAL DATA:  80 year old female with shortness of breath.  MRSA. EXAM: PORTABLE CHEST 1 VIEW COMPARISON:  Portable chest 03/14/2021 and earlier. FINDINGS: Portable AP semi upright view at 0958 hours. Lower lung volumes with new bibasilar veiling opacity. No pneumothorax. Pulmonary vascularity within normal limits. Stable cardiac size and mediastinal contours. Postoperative changes to the heart and mediastinum. Chronic thoracic vertebral augmentation. Visualized tracheal air column is within normal limits. Stable visible bowel gas pattern. IMPRESSION: Lower lung volumes with evidence of bilateral pleural effusions, new or increased since 03/14/2021. No pulmonary edema. Electronically Signed   By: Genevie Ann M.D.   On: 03/20/2021 10:12   DG Chest Port 1 View  Result Date: 03/14/2021 CLINICAL DATA:  Right facial swelling and tenderness. Possible sepsis. EXAM: PORTABLE CHEST 1 VIEW COMPARISON:   01/24/2021 FINDINGS: Stable mild cardiomegaly. Aortic atherosclerotic calcification noted. Previous median sternotomy and mitral valve replacement. No evidence of pulmonary infiltrate or edema. No evidence of pleural effusion. IMPRESSION: Mild cardiomegaly.  No active lung disease. Electronically Signed   By: Marlaine Hind M.D.   On: 03/14/2021 08:31   ECHOCARDIOGRAM COMPLETE  Result Date: 03/15/2021    ECHOCARDIOGRAM REPORT   Patient Name:   Michele Green Date of Exam: 03/15/2021 Medical Rec #:  [id number]        Height:       63.0 in Accession #:    [id number]       Weight:       108.0 lb Date of Birth:  1941-07-03        BSA:          1.488 m Patient Age:    49 years         BP:           107/60 mmHg Patient Gender: F                HR:           123 bpm. Exam Location:  Inpatient Procedure: 2D Echo, Cardiac Doppler and Color Doppler Indications:    Syncope  History:        Patient has prior history of Echocardiogram examinations, most                 recent 07/17/2021. CAD, Prior CABG; Risk Factors:Hypertension.                  Mitral Valve: prosthetic annuloplasty ring valve is present in                 the mitral position.  Sonographer:    Cammy Brochure Referring Phys: [id number] [place]  1. Left ventricular ejection fraction, by estimation, is 60 to 65%. The left ventricle has normal function. The left ventricle has no regional wall motion abnormalities. Left ventricular diastolic parameters are indeterminate.  2. Right ventricular systolic function is normal. The right ventricular size is normal.  3. Left atrial size was severely dilated.  4. The mitral valve has been repaired/replaced. Mild mitral valve regurgitation. Mild mitral stenosis. There is a prosthetic annuloplasty ring present in the  mitral position.  5. Tricuspid valve regurgitation is mild to moderate.  6. The aortic valve is tricuspid. Aortic valve regurgitation is not visualized. No aortic stenosis is present.  FINDINGS  Left Ventricle: Left ventricular ejection fraction, by estimation, is 60 to 65%. The left ventricle has normal function. The left ventricle has no regional wall motion abnormalities. The left ventricular internal cavity size was normal in size. There is  no left ventricular hypertrophy. Left ventricular diastolic parameters are indeterminate. Right Ventricle: The right ventricular size is normal.Right ventricular systolic function is normal. Left Atrium: Left atrial size was severely dilated. Right Atrium: Right atrial size was normal in size. Pericardium: There is no evidence of pericardial effusion. Mitral Valve: The mitral valve has been repaired/replaced. Mild mitral valve regurgitation. There is a prosthetic annuloplasty ring present in the mitral position. Mild mitral valve stenosis. MV peak gradient, 15.1 mmHg. The mean mitral valve gradient is  6.0 mmHg. Tricuspid Valve: The tricuspid valve is normal in structure. Tricuspid valve regurgitation is mild to moderate. No evidence of tricuspid stenosis. Aortic Valve: The aortic valve is tricuspid. Aortic valve regurgitation is not visualized. No aortic stenosis is present. Aortic valve mean gradient measures 4.5 mmHg. Aortic valve peak gradient measures 8.1 mmHg. Aortic valve area, by VTI measures 2.33 cm. Pulmonic Valve: The pulmonic valve was normal in structure. Pulmonic valve regurgitation is trivial. No evidence of pulmonic stenosis. Aorta: The aortic root is normal in size and structure. Venous: The inferior vena cava was not well visualized. IAS/Shunts: No atrial level shunt detected by color flow Doppler.  LEFT VENTRICLE PLAX 2D LVIDd:         3.30 cm  Diastology LVIDs:         2.30 cm  LV e' medial:   5.22 cm/s LV PW:         1.00 cm  LV E/e' medial: 33.3 LV IVS:        1.00 cm LVOT diam:     1.80 cm LV SV:         47 LV SV Index:   32 LVOT Area:     2.54 cm  RIGHT VENTRICLE RV S prime:     8.05 cm/s LEFT ATRIUM              Index       RIGHT  ATRIUM           Index LA diam:        5.10 cm  3.43 cm/m  RA Area:     16.90 cm LA Vol (A2C):   113.0 ml 75.93 ml/m RA Volume:   41.50 ml  27.89 ml/m LA Vol (A4C):   113.0 ml 75.93 ml/m LA Biplane Vol: 117.0 ml 78.62 ml/m  AORTIC VALVE AV Area (Vmax):    2.04 cm AV Area (Vmean):   2.09 cm AV Area (VTI):     2.33 cm AV Vmax:           142.50 cm/s AV Vmean:          101.950 cm/s AV VTI:            0.204 m AV Peak Grad:      8.1 mmHg AV Mean Grad:      4.5 mmHg LVOT Vmax:         114.00 cm/s LVOT Vmean:        83.800 cm/s LVOT VTI:          0.186 m LVOT/AV VTI  ratio: 0.91  AORTA Ao Root diam: 3.10 cm Ao Asc diam:  2.80 cm MITRAL VALVE                TRICUSPID VALVE MV Area (PHT): 2.82 cm     TR Peak grad:   37.2 mmHg MV Area VTI:   1.50 cm     TR Vmax:        305.00 cm/s MV Peak grad:  15.1 mmHg MV Mean grad:  6.0 mmHg     SHUNTS MV Vmax:       1.94 m/s     Systemic VTI:  0.19 m MV Vmean:      116.0 cm/s   Systemic Diam: 1.80 cm MV Decel Time: 269 msec MV E velocity: 174.00 cm/s Kirk Ruths MD Electronically signed by Kirk Ruths MD Signature Date/Time: 03/15/2021/10:53:56 AM    Final     Microbiology: Recent Results (from the past 240 hour(s))  MRSA PCR Screening     Status: Abnormal   Collection Time: 03/14/21  6:00 PM   Specimen: Nasopharyngeal  Result Value Ref Range Status   MRSA by PCR POSITIVE (A) NEGATIVE Final    Comment:        The GeneXpert MRSA Assay (FDA approved for NASAL specimens only), is one component of a comprehensive MRSA colonization surveillance program. It is not intended to diagnose MRSA infection nor to guide or monitor treatment for MRSA infections. RESULT CALLED TO, READ BACK BY AND VERIFIED WITH: K,HOLDEN RN @2022  03/14/21 EB Performed at [facility] [address] [place], [place] [address]   Respiratory (~20 pathogens) panel by PCR     Status: None   Collection Time: 03/14/21  6:30 PM  Result Value Ref Range Status   Adenovirus NOT  DETECTED NOT DETECTED Final   Coronavirus 229E NOT DETECTED NOT DETECTED Final    Comment: (NOTE) The Coronavirus on the Respiratory Panel, DOES NOT test for the novel  Coronavirus (2019 nCoV)    Coronavirus HKU1 NOT DETECTED NOT DETECTED Final   Coronavirus NL63 NOT DETECTED NOT DETECTED Final   Coronavirus OC43 NOT DETECTED NOT DETECTED Final   Metapneumovirus NOT DETECTED NOT DETECTED Final   Rhinovirus / Enterovirus NOT DETECTED NOT DETECTED Final   Influenza A NOT DETECTED NOT DETECTED Final   Influenza B NOT DETECTED NOT DETECTED Final   Parainfluenza Virus 1 NOT DETECTED NOT DETECTED Final   Parainfluenza Virus 2 NOT DETECTED NOT DETECTED Final   Parainfluenza Virus 3 NOT DETECTED NOT DETECTED Final   Parainfluenza Virus 4 NOT DETECTED NOT DETECTED Final   Respiratory Syncytial Virus NOT DETECTED NOT DETECTED Final   Bordetella pertussis NOT DETECTED NOT DETECTED Final   Bordetella Parapertussis NOT DETECTED NOT DETECTED Final   Chlamydophila pneumoniae NOT DETECTED NOT DETECTED Final   Mycoplasma pneumoniae NOT DETECTED NOT DETECTED Final    Comment: Performed at [facility], [place], [place], [place] [address]  C Difficile Quick Screen w PCR reflex     Status: None   Collection Time: 03/15/21  6:00 PM   Specimen: STOOL  Result Value Ref Range Status   C Diff antigen NEGATIVE NEGATIVE Final   C Diff toxin NEGATIVE NEGATIVE Final   C Diff interpretation No C. difficile detected.  Final    Comment: Performed at [facility], [place] [address], [place], [place] [address]  Culture, blood (routine x 2)     Status: None   Collection Time:  03/16/21  9:28 AM   Specimen: BLOOD  Result Value Ref Range Status   Specimen Description BLOOD RIGHT ANTECUBITAL  Final   Special Requests   Final    BOTTLES DRAWN AEROBIC AND ANAEROBIC Blood Culture adequate volume   Culture   Final    NO GROWTH 5 DAYS Performed at [facility], [address] [place], [place]  [address]    Report Status 03/21/2021 FINAL  Final  Culture, blood (routine x 2)     Status: None   Collection Time: 03/16/21  9:28 AM   Specimen: BLOOD  Result Value Ref Range Status   Specimen Description BLOOD BLOOD RIGHT HAND  Final   Special Requests   Final    BOTTLES DRAWN AEROBIC AND ANAEROBIC Blood Culture results may not be optimal due to an inadequate volume of blood received in culture bottles   Culture   Final    NO GROWTH 5 DAYS Performed at [facility], [place] [address] [place], [place] [address]    Report Status 03/21/2021 FINAL  Final  Culture, blood (routine x 2)     Status: None   Collection Time: 03/17/21  3:12 AM   Specimen: BLOOD LEFT HAND  Result Value Ref Range Status   Specimen Description BLOOD LEFT HAND  Final   Special Requests   Final    BOTTLES DRAWN AEROBIC ONLY Blood Culture adequate volume   Culture   Final    NO GROWTH 5 DAYS Performed at [facility], [place] [address], [place], [place] [address]    Report Status 03/22/2021 FINAL  Final  Culture, blood (routine x 2)     Status: None   Collection Time: 03/17/21  3:13 AM   Specimen: BLOOD  Result Value Ref Range Status   Specimen Description BLOOD SITE NOT SPECIFIED  Final   Special Requests   Final    BOTTLES DRAWN AEROBIC ONLY Blood Culture adequate volume   Culture   Final    NO GROWTH 5 DAYS Performed at [facility], [place] [address], [place], [place] [address]    Report Status 03/22/2021 FINAL  Final  Gastrointestinal Panel by PCR , Stool     Status: None   Collection Time: 03/19/21 10:03 AM   Specimen: Stool  Result Value Ref Range Status   Campylobacter species NOT DETECTED NOT DETECTED Final   Plesimonas shigelloides NOT DETECTED NOT DETECTED Final   Salmonella species NOT DETECTED NOT DETECTED Final   Yersinia enterocolitica NOT DETECTED NOT DETECTED Final   Vibrio species NOT DETECTED NOT DETECTED Final   Vibrio cholerae NOT DETECTED NOT DETECTED Final    Enteroaggregative E coli (EAEC) NOT DETECTED NOT DETECTED Final   Enteropathogenic E coli (EPEC) NOT DETECTED NOT DETECTED Final   Enterotoxigenic E coli (ETEC) NOT DETECTED NOT DETECTED Final   Shiga like toxin producing E coli (STEC) NOT DETECTED NOT DETECTED Final   Shigella/Enteroinvasive E coli (EIEC) NOT DETECTED NOT DETECTED Final   Cryptosporidium NOT DETECTED NOT DETECTED Final   Cyclospora cayetanensis NOT DETECTED NOT DETECTED Final   Entamoeba histolytica NOT DETECTED NOT DETECTED Final   Giardia lamblia NOT DETECTED NOT DETECTED Final   Adenovirus F40/41 NOT DETECTED NOT DETECTED Final   Astrovirus NOT DETECTED NOT DETECTED Final   Norovirus GI/GII NOT DETECTED NOT DETECTED Final   Rotavirus A NOT DETECTED NOT DETECTED Final   Sapovirus (I, II, IV, and V) NOT DETECTED NOT DETECTED Final  Comment: Performed at [facility], [place], [place] [address]  SARS CORONAVIRUS 2 (TAT 6-24 HRS) Nasopharyngeal Nasopharyngeal Swab     Status: None   Collection Time: 03/23/21  3:43 PM   Specimen: Nasopharyngeal Swab  Result Value Ref Range Status   SARS Coronavirus 2 NEGATIVE NEGATIVE Final    Comment: (NOTE) SARS-CoV-2 target nucleic acids are NOT DETECTED.  The SARS-CoV-2 RNA is generally detectable in upper and lower respiratory specimens during the acute phase of infection. Negative results do not preclude SARS-CoV-2 infection, do not rule out co-infections with other pathogens, and should not be used as the sole basis for treatment or other patient management decisions. Negative results must be combined with clinical observations, patient history, and epidemiological information. The expected result is Negative.  Fact Sheet for Patients: SugarRoll.be  Fact Sheet for Healthcare Providers: https://www.woods-mathews.com/  This test is not yet approved or cleared by the [place] FDA and  has been  authorized for detection and/or diagnosis of SARS-CoV-2 by FDA under an Emergency Use Authorization (EUA). This EUA will remain  in effect (meaning this test can be used) for the duration of the COVID-19 declaration under Se ction 564(b)(1) of the Act, 21 U.S.C. section 360bbb-3(b)(1), unless the authorization is terminated or revoked sooner.  Performed at [facility], [place] [address] [place], [place] [address]      Labs: Basic Metabolic Panel: Recent Labs  Lab 03/19/21 0241 03/20/21 0342 03/20/21 1557 03/21/21 0309 03/21/21 0602 03/22/21 0123 03/23/21 0302  NA 135 136  --  135  --  136 136  K 3.4* 3.6  --  4.8  --  4.9 4.7  CL 103 105  --  104  --  104 103  CO2 23 26  --  26  --  27 29  GLUCOSE 124* 95  --  118*  --  158* 129*  BUN 21 20  --  14  --  14 18  CREATININE 0.72 0.63  --  0.61  --  0.58 0.60  CALCIUM 8.1* 7.9*  --  7.8*  --  8.2* 8.2*  MG 1.5*  --  1.7  --  2.3  --  1.8   Liver Function Tests: No results for input(s): AST, ALT, ALKPHOS, BILITOT, PROT, ALBUMIN in the last 168 hours. No results for input(s): LIPASE, AMYLASE in the last 168 hours. No results for input(s): AMMONIA in the last 168 hours. CBC: Recent Labs  Lab 03/18/21 0437 03/19/21 0241 03/20/21 0342 03/21/21 0309 03/22/21 0123  WBC 8.8 14.0* 13.1* 10.8* 10.8*  HGB 9.5* 10.4* 10.2* 9.6* 10.2*  HCT 28.7* 31.3* 30.8* 29.0* 31.0*  MCV 90.5 90.5 91.4 92.4 92.0  PLT 172 186 161 153 172   Cardiac Enzymes: No results for input(s): CKTOTAL, CKMB, CKMBINDEX, TROPONINI in the last 168 hours. BNP: BNP (last 3 results) Recent Labs    08/16/20 1452 01/16/21 1909 01/24/21 1610  BNP 482.0* 229.0* 478.7*    ProBNP (last 3 results) No results for input(s): PROBNP in the last 8760 hours.  CBG: No results for input(s): GLUCAP in the last 168 hours.     Signed:  Oswald Hillock MD.  [facility] 03/24/2021, 12:31 PM

## 2021-03-24 NOTE — NC FL2 (Signed)
[place] MEDICAID FL2 LEVEL OF CARE SCREENING TOOL     IDENTIFICATION  Patient Name: Michele Green Birthdate: 03-09-1941 Sex: female Admission Date (Current Location): 03/14/2021  [facility] and [place] Number:  Herbalist and Address:  [facility] [place]. [facility], [place] [address], [place], [place] [address]      Provider Number: [id number]  Attending Physician Name and Address:  Oswald Hillock, MD  Relative Name and Phone Number:  [name] [phone]    Current Level of Care: Hospital Recommended Level of Care: [place] ([facility] with hospice services to follow) Prior Approval Number:    Date Approved/Denied:   PASRR Number:    Discharge Plan: Other (Comment) ([facility] with hospice services to follow)    Current Diagnoses: Patient Active Problem List   Diagnosis Date Noted  . MRSA bacteremia 03/15/2021  . Multiple open wounds of lower extremity 03/15/2021  . Acute parotitis 03/14/2021  . Sepsis ([place]) 03/14/2021  . Atrial tachycardia ([place])   . Adrenal insufficiency (Addison's disease) ([place]) 01/24/2021  . Anemia of chronic disease 01/24/2021  . Functional quadriplegia ([place]) 01/24/2021  . Hypotension 01/17/2021  . Elevated troponin 01/17/2021  . Debility 01/17/2021  . Chronic pain 01/17/2021  . Ulcerative colitis ([place]) 01/17/2021  . Chronic respiratory failure with hypoxia ([place]) 12/03/2020  . Pressure injury of skin 12/02/2020  . Pneumonia due to COVID-19 virus 12/01/2020  . T8 vertebral fracture ([place]) 09/13/2020  . Compression fracture of C-spine ([place]) 09/12/2020  . Compression fracture of thoracic spine, non-traumatic ([place]) 09/12/2020  . Goals of care, counseling/discussion   . Palliative care by specialist   . Spinal fracture of T7 vertebra ([place]) 08/17/2020  . Atrial fibrillation with RVR ([place]) 08/16/2020  . Long term (current) use of anticoagulants 08/28/2018  . Atrial flutter with rapid ventricular response  ([place]) 08/14/2018  . 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 S/P Maze operation for atrial fibrillation 08/10/2018  . Chronic diastolic HF (heart failure) ([place]) 06/20/2018  . Osteoporosis 09/29/2017  . Loosening of hardware in spine ([place]) 08/23/2017  . Spinal stenosis of lumbar region 08/11/2016  . Unspecified cord compression ([place]) 08/11/2016  . Unstable burst fracture of third lumbar vertebra ([place]) 08/11/2016  . Genetic testing 03/17/2016  . Family history of breast cancer   . Breast cancer of upper-outer quadrant of left female breast ([place]) 02/02/2016  . On amiodarone therapy 09/09/2015  . On continuous oral anticoagulation 08/22/2015  . Persistent atrial fibrillation ([place]) 08/22/2015  . Essential hypertension 11/07/2013  . Mitral regurgitation 11/07/2013    Orientation RESPIRATION BLADDER Height & Weight     Self  O2 (Nasal Cannula 2 liters) Incontinent Weight: 126 lb 8.7 oz (57.4 kg) Height:  5' 3"  (160 cm)  BEHAVIORAL SYMPTOMS/MOOD NEUROLOGICAL BOWEL NUTRITION STATUS      Incontinent Diet (NAS)  AMBULATORY STATUS COMMUNICATION OF NEEDS Skin   Limited Assist Verbally Other (Comment) (PI Heel Right stage 3,wound/incision open or dehiced,skin tear, non-pressure wound pretibial,right,left)                       Personal Care Assistance Level of Assistance  Bathing,Feeding,Dressing Bathing Assistance: Maximum assistance Feeding assistance: Limited assistance Dressing Assistance: Maximum assistance     Functional Limitations Info  Sight,Hearing,Speech Sight Info: Impaired Hearing Info: Adequate Speech Info: Adequate    SPECIAL CARE FACTORS FREQUENCY  PT (By licensed PT),OT (By licensed OT)  PT Frequency: 3x min weekly OT Frequency: 3x min weekly            Contractures Contractures Info: Not present    Additional Factors Info  Code Status,Allergies,Psychotropic Code Status Info: DNR Allergies Info: Shellfish  Allergy,Amoxicillin-pot Clavulanate Psychotropic Info: escitalopram (LEXAPRO) tablet 10 mg daily         TAKE these medications       acetaminophen 500 MG tablet Commonly known as: TYLENOL Take 1,000 mg by mouth every 6 (six) hours as needed for headache (pain).   bisacodyl 10 MG suppository Commonly known as: DULCOLAX Place 10 mg rectally daily as needed for moderate constipation.   camphor-menthol lotion Commonly known as: SARNA Apply 1 application topically See admin instructions. Apply lotion topically to patient's back twice daily for itching   clonazePAM 0.5 MG tablet Commonly known as: KLONOPIN Take 1 tablet (0.5 mg total) by mouth 2 (two) times daily as needed for anxiety (seizures). What changed: reasons to take this   digoxin 0.125 MG tablet Commonly known as: LANOXIN Take 1 tablet (0.125 mg total) by mouth daily. What changed:   medication strength  how much to take   diltiazem 180 MG 24 hr capsule Commonly known as: CARDIZEM CD Take 1 capsule (180 mg total) by mouth daily. Start taking on: Mar 25, 2021 What changed:   medication strength  how much to take   haloperidol 0.5 MG tablet Commonly known as: HALDOL Take 0.5 mg by mouth every 6 (six) hours.   hydrOXYzine 25 MG tablet Commonly known as: ATARAX/VISTARIL Take 25 mg by mouth in the morning and at bedtime.   linezolid 600 MG tablet Commonly known as: ZYVOX Take 1 tablet (600 mg total) by mouth every 12 (twelve) hours. Start taking on: Mar 28, 2021   loperamide 2 MG capsule Commonly known as: IMODIUM Take 2 capsules (4 mg total) by mouth 3 (three) times daily as needed for diarrhea or loose stools.   mesalamine 0.375 g 24 hr capsule Commonly known as: APRISO Take 375 mg by mouth every morning.   metoprolol tartrate 25 MG tablet Commonly known as: LOPRESSOR Take 1 tablet (25 mg total) by mouth 2 (two) times daily.   metroNIDAZOLE 500 MG tablet Commonly known as:  FLAGYL Take 1 tablet (500 mg total) by mouth every 8 (eight) hours for 1 day.   oxyCODONE 5 MG immediate release tablet Commonly known as: Oxy IR/ROXICODONE Take 1 tablet (5 mg total) by mouth every 6 (six) hours as needed for breakthrough pain. What changed:   reasons to take this  Another medication with the same name was removed. Continue taking this medication, and follow the directions you see here.   OXYGEN Inhale 2 L into the lungs continuous.   saccharomyces boulardii 250 MG capsule Commonly known as: FLORASTOR Take 1 capsule (250 mg total) by mouth 2 (two) times daily.   senna-docusate 8.6-50 MG tablet Commonly known as: Senokot-S Take 1 tablet by mouth daily.   Xarelto 15 MG Tabs tablet Generic drug: Rivaroxaban TAKE 1 TABLET BY MOUTH  DAILY WITH SUPPER What changed: how much to take      Relevant Imaging Results:  Relevant Lab Results:   Additional Information [id number]  Trula Ore, LCSWA

## 2021-03-26 DIAGNOSIS — Z20822 Contact with and (suspected) exposure to covid-19: Secondary | ICD-10-CM | POA: Diagnosis not present

## 2021-04-01 DIAGNOSIS — Z20822 Contact with and (suspected) exposure to covid-19: Secondary | ICD-10-CM | POA: Diagnosis not present

## 2021-04-02 DIAGNOSIS — I480 Paroxysmal atrial fibrillation: Secondary | ICD-10-CM | POA: Diagnosis not present

## 2021-04-02 DIAGNOSIS — I482 Chronic atrial fibrillation, unspecified: Secondary | ICD-10-CM | POA: Diagnosis not present

## 2021-04-22 DEATH — deceased

## 2021-04-23 ENCOUNTER — Ambulatory Visit: Payer: Medicare Other | Admitting: Cardiology

## 2021-05-01 ENCOUNTER — Ambulatory Visit: Payer: Medicare Other | Admitting: Hematology
